# Patient Record
Sex: Male | Born: 1945 | Race: Black or African American | Hispanic: No | Smoking: Never smoker
Health system: Southern US, Community
[De-identification: ages and names within clinical notes are randomized; demographics above are authoritative.]

## PROBLEM LIST (undated history)

## (undated) DIAGNOSIS — A419 Sepsis, unspecified organism: Secondary | ICD-10-CM

## (undated) DIAGNOSIS — R6520 Severe sepsis without septic shock: Secondary | ICD-10-CM

## (undated) DIAGNOSIS — I4891 Unspecified atrial fibrillation: Secondary | ICD-10-CM

## (undated) DIAGNOSIS — I639 Cerebral infarction, unspecified: Secondary | ICD-10-CM

## (undated) DIAGNOSIS — U071 COVID-19: Secondary | ICD-10-CM

## (undated) DIAGNOSIS — J1282 Pneumonia due to coronavirus disease 2019: Secondary | ICD-10-CM

## (undated) DIAGNOSIS — N400 Enlarged prostate without lower urinary tract symptoms: Secondary | ICD-10-CM

## (undated) DIAGNOSIS — J9621 Acute and chronic respiratory failure with hypoxia: Secondary | ICD-10-CM

---

## 2019-11-21 ENCOUNTER — Other Ambulatory Visit (HOSPITAL_COMMUNITY): Payer: Medicare HMO

## 2019-11-21 ENCOUNTER — Inpatient Hospital Stay
Admission: EM | Admit: 2019-11-21 | Discharge: 2020-02-14 | Payer: Medicare HMO | Source: Other Acute Inpatient Hospital | Attending: Internal Medicine | Admitting: Internal Medicine

## 2019-11-21 DIAGNOSIS — J189 Pneumonia, unspecified organism: Secondary | ICD-10-CM

## 2019-11-21 DIAGNOSIS — J398 Other specified diseases of upper respiratory tract: Secondary | ICD-10-CM

## 2019-11-21 DIAGNOSIS — R0603 Acute respiratory distress: Secondary | ICD-10-CM

## 2019-11-21 DIAGNOSIS — N179 Acute kidney failure, unspecified: Secondary | ICD-10-CM

## 2019-11-21 DIAGNOSIS — Z930 Tracheostomy status: Secondary | ICD-10-CM

## 2019-11-21 DIAGNOSIS — R6520 Severe sepsis without septic shock: Secondary | ICD-10-CM | POA: Diagnosis present

## 2019-11-21 DIAGNOSIS — U071 COVID-19: Secondary | ICD-10-CM | POA: Diagnosis present

## 2019-11-21 DIAGNOSIS — R0902 Hypoxemia: Secondary | ICD-10-CM

## 2019-11-21 DIAGNOSIS — D729 Disorder of white blood cells, unspecified: Secondary | ICD-10-CM

## 2019-11-21 DIAGNOSIS — R7981 Abnormal blood-gas level: Secondary | ICD-10-CM

## 2019-11-21 DIAGNOSIS — J90 Pleural effusion, not elsewhere classified: Secondary | ICD-10-CM

## 2019-11-21 DIAGNOSIS — J9600 Acute respiratory failure, unspecified whether with hypoxia or hypercapnia: Secondary | ICD-10-CM | POA: Diagnosis present

## 2019-11-21 DIAGNOSIS — Z931 Gastrostomy status: Secondary | ICD-10-CM

## 2019-11-21 DIAGNOSIS — J9690 Respiratory failure, unspecified, unspecified whether with hypoxia or hypercapnia: Secondary | ICD-10-CM

## 2019-11-21 DIAGNOSIS — J09X1 Influenza due to identified novel influenza A virus with pneumonia: Secondary | ICD-10-CM

## 2019-11-21 DIAGNOSIS — J9621 Acute and chronic respiratory failure with hypoxia: Secondary | ICD-10-CM | POA: Diagnosis present

## 2019-11-21 DIAGNOSIS — A419 Sepsis, unspecified organism: Secondary | ICD-10-CM | POA: Diagnosis present

## 2019-11-21 DIAGNOSIS — I4891 Unspecified atrial fibrillation: Secondary | ICD-10-CM | POA: Diagnosis present

## 2019-11-21 DIAGNOSIS — Z9911 Dependence on respirator [ventilator] status: Secondary | ICD-10-CM

## 2019-11-21 DIAGNOSIS — Z992 Dependence on renal dialysis: Secondary | ICD-10-CM

## 2019-11-21 HISTORY — DX: Acute and chronic respiratory failure with hypoxia: J96.21

## 2019-11-21 HISTORY — DX: Pneumonia due to coronavirus disease 2019: J12.82

## 2019-11-21 HISTORY — DX: Sepsis, unspecified organism: R65.20

## 2019-11-21 HISTORY — DX: Sepsis, unspecified organism: A41.9

## 2019-11-21 HISTORY — DX: Unspecified atrial fibrillation: I48.91

## 2019-11-21 HISTORY — DX: COVID-19: U07.1

## 2019-11-22 ENCOUNTER — Encounter: Payer: Self-pay | Admitting: Internal Medicine

## 2019-11-22 ENCOUNTER — Other Ambulatory Visit (HOSPITAL_COMMUNITY): Payer: Medicare HMO

## 2019-11-22 DIAGNOSIS — A419 Sepsis, unspecified organism: Secondary | ICD-10-CM | POA: Diagnosis not present

## 2019-11-22 DIAGNOSIS — R6520 Severe sepsis without septic shock: Secondary | ICD-10-CM

## 2019-11-22 DIAGNOSIS — J9621 Acute and chronic respiratory failure with hypoxia: Secondary | ICD-10-CM | POA: Diagnosis not present

## 2019-11-22 DIAGNOSIS — U071 COVID-19: Secondary | ICD-10-CM | POA: Diagnosis present

## 2019-11-22 DIAGNOSIS — I4891 Unspecified atrial fibrillation: Secondary | ICD-10-CM | POA: Diagnosis not present

## 2019-11-22 DIAGNOSIS — J1282 Pneumonia due to coronavirus disease 2019: Secondary | ICD-10-CM | POA: Diagnosis present

## 2019-11-22 DIAGNOSIS — J9600 Acute respiratory failure, unspecified whether with hypoxia or hypercapnia: Secondary | ICD-10-CM | POA: Diagnosis present

## 2019-11-22 LAB — COMPREHENSIVE METABOLIC PANEL
ALT: 39 U/L (ref 0–44)
AST: 36 U/L (ref 15–41)
Albumin: 1.3 g/dL — ABNORMAL LOW (ref 3.5–5.0)
Alkaline Phosphatase: 161 U/L — ABNORMAL HIGH (ref 38–126)
Anion gap: 7 (ref 5–15)
BUN: 48 mg/dL — ABNORMAL HIGH (ref 8–23)
CO2: 20 mmol/L — ABNORMAL LOW (ref 22–32)
Calcium: 7.9 mg/dL — ABNORMAL LOW (ref 8.9–10.3)
Chloride: 121 mmol/L — ABNORMAL HIGH (ref 98–111)
Creatinine, Ser: 0.99 mg/dL (ref 0.61–1.24)
GFR calc Af Amer: >60 mL/min (ref >60)
GFR calc non Af Amer: >60 mL/min (ref >60)
Glucose, Bld: 134 mg/dL — ABNORMAL HIGH (ref 70–99)
Potassium: 4.4 mmol/L (ref 3.5–5.1)
Sodium: 148 mmol/L — ABNORMAL HIGH (ref 135–145)
Total Bilirubin: 0.8 mg/dL (ref 0.3–1.2)
Total Protein: 5.1 g/dL — ABNORMAL LOW (ref 6.5–8.1)

## 2019-11-22 LAB — VANCOMYCIN, RANDOM: Vancomycin Rm: 31

## 2019-11-22 LAB — URINALYSIS, ROUTINE W REFLEX MICROSCOPIC
Bilirubin Urine: NEGATIVE
Glucose, UA: NEGATIVE mg/dL
Ketones, ur: NEGATIVE mg/dL
Nitrite: NEGATIVE
Protein, ur: NEGATIVE mg/dL
Specific Gravity, Urine: 1.015 (ref 1.005–1.030)
pH: 5 (ref 5.0–8.0)

## 2019-11-22 LAB — PROTIME-INR
INR: 1.6 — ABNORMAL HIGH (ref 0.8–1.2)
Prothrombin Time: 19.2 seconds — ABNORMAL HIGH (ref 11.4–15.2)

## 2019-11-22 LAB — CBC
HCT: 26.6 % — ABNORMAL LOW (ref 39.0–52.0)
Hemoglobin: 8.7 g/dL — ABNORMAL LOW (ref 13.0–17.0)
MCH: 26.8 pg (ref 26.0–34.0)
MCHC: 32.7 g/dL (ref 30.0–36.0)
MCV: 81.8 fL (ref 80.0–100.0)
Platelets: 253 10*3/uL (ref 150–400)
RBC: 3.25 MIL/uL — ABNORMAL LOW (ref 4.22–5.81)
WBC: 32.4 10*3/uL — ABNORMAL HIGH (ref 4.0–10.5)
nRBC: 0.1 % (ref 0.0–0.2)

## 2019-11-22 LAB — VANCOMYCIN, TROUGH: Vancomycin Tr: 27 ug/mL (ref 15–20)

## 2019-11-22 LAB — TSH: TSH: 7.162 u[IU]/mL — ABNORMAL HIGH (ref 0.350–4.500)

## 2019-11-22 LAB — APTT: aPTT: 33 seconds (ref 24–36)

## 2019-11-22 LAB — TROPONIN I (HIGH SENSITIVITY): Troponin I (High Sensitivity): 38 ng/L — ABNORMAL HIGH (ref <18)

## 2019-11-22 MED ORDER — ALBUTEROL SULFATE HFA 108 (90 BASE) MCG/ACT IN AERS
2.00 | INHALATION_SPRAY | RESPIRATORY_TRACT | Status: DC
Start: ? — End: 2019-11-22

## 2019-11-22 MED ORDER — TOBRAMYCIN SULFATE 80 MG/2ML IJ SOLN
300.00 | INTRAMUSCULAR | Status: DC
Start: 2019-11-21 — End: 2019-11-22

## 2019-11-22 MED ORDER — IPRATROPIUM-ALBUTEROL 0.5-2.5 (3) MG/3ML IN SOLN
3.00 | RESPIRATORY_TRACT | Status: DC
Start: ? — End: 2019-11-22

## 2019-11-22 MED ORDER — LACTATED RINGERS IV SOLN
500.00 | INTRAVENOUS | Status: DC
Start: ? — End: 2019-11-22

## 2019-11-22 MED ORDER — GENERIC EXTERNAL MEDICATION
Status: DC
Start: ? — End: 2019-11-22

## 2019-11-22 MED ORDER — HEPARIN SODIUM (PORCINE) 5000 UNIT/ML IJ SOLN
5000.00 | INTRAMUSCULAR | Status: DC
Start: 2019-11-21 — End: 2019-11-22

## 2019-11-22 MED ORDER — NALOXONE HCL 0.4 MG/ML IJ SOLN
0.04 | INTRAMUSCULAR | Status: DC
Start: ? — End: 2019-11-22

## 2019-11-22 MED ORDER — GENERIC EXTERNAL MEDICATION
4000.00 | Status: DC
Start: ? — End: 2019-11-22

## 2019-11-22 MED ORDER — GENERIC EXTERNAL MEDICATION
1.00 | Status: DC
Start: ? — End: 2019-11-22

## 2019-11-22 MED ORDER — AMIODARONE HCL 200 MG PO TABS
400.00 | ORAL_TABLET | ORAL | Status: DC
Start: 2019-11-21 — End: 2019-11-22

## 2019-11-22 MED ORDER — GENERIC EXTERNAL MEDICATION
500.00 | Status: DC
Start: 2019-11-21 — End: 2019-11-22

## 2019-11-22 MED ORDER — ATROPINE SULFATE 0.5 MG/5ML IJ SOSY
0.50 | PREFILLED_SYRINGE | INTRAMUSCULAR | Status: DC
Start: ? — End: 2019-11-22

## 2019-11-22 MED ORDER — METOPROLOL TARTRATE 5 MG/5ML IV SOLN
5.00 | INTRAVENOUS | Status: DC
Start: ? — End: 2019-11-22

## 2019-11-22 MED ORDER — COLLAGENASE 250 UNIT/GM EX OINT
TOPICAL_OINTMENT | CUTANEOUS | Status: DC
Start: 2019-11-21 — End: 2019-11-22

## 2019-11-22 MED ORDER — CLONAZEPAM 1 MG PO TABS
1.00 | ORAL_TABLET | ORAL | Status: DC
Start: ? — End: 2019-11-22

## 2019-11-22 MED ORDER — EPINEPHRINE 1 MG/10ML IJ SOSY
1.00 | PREFILLED_SYRINGE | INTRAMUSCULAR | Status: DC
Start: ? — End: 2019-11-22

## 2019-11-22 MED ORDER — DEXTROSE 50 % IV SOLN
25.00 | INTRAVENOUS | Status: DC
Start: ? — End: 2019-11-22

## 2019-11-22 MED ORDER — ACETAMINOPHEN 325 MG PO TABS
650.00 | ORAL_TABLET | ORAL | Status: DC
Start: ? — End: 2019-11-22

## 2019-11-22 MED ORDER — SODIUM CHLORIDE 0.9 % IV SOLN
INTRAVENOUS | Status: DC
Start: ? — End: 2019-11-22

## 2019-11-22 MED ORDER — PROSOURCE TF PO LIQD
90.00 | ORAL | Status: DC
Start: 2019-11-21 — End: 2019-11-22

## 2019-11-22 MED ORDER — GENERIC EXTERNAL MEDICATION
2.00 | Status: DC
Start: ? — End: 2019-11-22

## 2019-11-22 MED ORDER — TAMSULOSIN HCL 0.4 MG PO CAPS
0.40 | ORAL_CAPSULE | ORAL | Status: DC
Start: 2019-11-22 — End: 2019-11-22

## 2019-11-22 MED ORDER — ASPIRIN-DIPYRIDAMOLE ER 25-200 MG PO CP12
1.00 | ORAL_CAPSULE | ORAL | Status: DC
Start: 2019-11-21 — End: 2019-11-22

## 2019-11-22 MED ORDER — ERGOCALCIFEROL 200 MCG/ML PO SOLN
1000.00 | ORAL | Status: DC
Start: 2019-11-22 — End: 2019-11-22

## 2019-11-22 MED ORDER — ONDANSETRON HCL 4 MG/2ML IJ SOLN
4.00 | INTRAMUSCULAR | Status: DC
Start: ? — End: 2019-11-22

## 2019-11-22 MED ORDER — INSULIN ASPART 100 UNIT/ML FLEXPEN
0.00 | PEN_INJECTOR | SUBCUTANEOUS | Status: DC
Start: 2019-11-21 — End: 2019-11-22

## 2019-11-22 MED ORDER — METOCLOPRAMIDE HCL 5 MG/ML IJ SOLN
5.00 | INTRAMUSCULAR | Status: DC
Start: 2019-11-21 — End: 2019-11-22

## 2019-11-22 MED ORDER — PROPOFOL 100 MG/10ML IV EMUL
5.00 | INTRAVENOUS | Status: DC
Start: ? — End: 2019-11-22

## 2019-11-22 MED ORDER — DAKINS (1/4 STRENGTH) 0.125 % EX SOLN
CUTANEOUS | Status: DC
Start: 2019-11-21 — End: 2019-11-22

## 2019-11-22 MED ORDER — PROSOURCE TF PO LIQD
45.00 | ORAL | Status: DC
Start: 2019-11-22 — End: 2019-11-22

## 2019-11-22 MED ORDER — INSULIN GLARGINE 100 UNIT/ML SOLOSTAR PEN
18.00 | PEN_INJECTOR | SUBCUTANEOUS | Status: DC
Start: 2019-11-22 — End: 2019-11-22

## 2019-11-22 MED ORDER — VANCOMYCIN HCL IN DEXTROSE 1-5 GM/200ML-% IV SOLN
1000.00 | INTRAVENOUS | Status: DC
Start: 2019-11-22 — End: 2019-11-22

## 2019-11-22 MED ORDER — GENERIC EXTERNAL MEDICATION
50.00 | Status: DC
Start: ? — End: 2019-11-22

## 2019-11-22 MED ORDER — L-LYSINE EX OINT
TOPICAL_OINTMENT | CUTANEOUS | Status: DC
Start: ? — End: 2019-11-22

## 2019-11-22 MED ORDER — MAGNESIUM SULFATE 4 GM/100ML IV SOLN
4.00 | INTRAVENOUS | Status: DC
Start: ? — End: 2019-11-22

## 2019-11-22 MED ORDER — CARBOXYMETHYLCELLULOSE SODIUM 0.5 % OP SOLN
2.00 | OPHTHALMIC | Status: DC
Start: 2019-11-21 — End: 2019-11-22

## 2019-11-22 NOTE — Consult Note (Signed)
[facility]  Date of Service: 11/22/2019  PULMONARY CRITICAL CARE CONSULT   Shane Banks  [id number]  DOB: 1946-09-14   DOA: 11/21/2019  Referring Physician: [name], MD  HPI: Shane Banks is a 74 y.o. male seen for follow up of Acute on Chronic Respiratory Failure.  Patient has multiple medical problems including hypertension stroke cardiomyopathy gout has had an infection with COVID-19 had been admitted to the hospital however came back in because of shortness of breath was noted to be severely hypoxic.  Patient initially had not been given remdesivir or plasma however this time patient was more hypoxic and was given steroids.  In addition patient was noted to have some demand ischemia felt to be secondary to the Covid infection.  During the ED stay the patient having worsening of weakness increasing shortness of breath and fever up to 101.5 ended up intubated on the ventilator.  Right now is on the ventilator with a tracheostomy and is on pressure support 12/5  Review of Systems:  ROS performed and is unremarkable other than noted above.  PAST MEDICAL HISTORY Past Medical History:  Diagnosis Date  . Benign essential hypertension  . Cardiac murmur  . Colon polyp  . CVD (cerebrovascular disease) 2012  . Gout  . Hypertrophic cardiomyopathy ([place])  . Prediabetes  . Teeth missing    PAST SURGICAL HISTORY Past Surgical History:  Procedure Laterality Date  . HX COLONOSCOPY 02/28/2007  polyps  . PR COLONOSCOPY FLX DX W/COLLJ SPEC WHEN PFRMD N/A 07/14/2016  COLONOSCOPY FLX DX W/COLLJ SPEC WHEN PFRMD performed by Hulen Luster, MD at [facility]  . PR ESOPHAGOGASTRODUODENOSCOPY TRANSORAL DIAGNOSTIC N/A 03/06/2017  ESOPHAGOGASTRODUODENOSCOPY TRANSORAL DIAGNOSTIC performed by Karie Chimera, MD at [place]  . PT DENIES RELEVANT SURGICAL HISTORY   ALLERGIES Allergies  Allergen Reactions  . Penicillin G  Unknown  . Penicillins Rash and Itching    FAMILY HISTORY Family History  Problem Relation Name Age of Onset  . No Known Problems Father  . No Known Problems Mother   SOCIAL HISTORY Social History   Tobacco Use  . Smoking status: Former Smoker  Quit date: 09/28/2005  Years since quitting: 14.0  . Smokeless tobacco: Never Used  Substance Use Topics  . Alcohol use: Yes  Comment: Occasional  . Drug use: No    Medications: Reviewed on Rounds  Physical Exam:  Vitals: Temperature 96.3 pulse 68 respiratory 25 blood pressure 116/65 saturations 100%  Ventilator Settings mode of ventilation pressure support FiO2 28% pressure 12 PEEP 5  . General: Comfortable at this time . Eyes: Grossly normal lids, irises & conjunctiva . ENT: grossly tongue is normal . Neck: no obvious mass . Cardiovascular: S1-S2 normal no gallop or rub . Respiratory: Coarse breath sounds noted bilaterally . Abdomen: Soft and nontender . Skin: no rash seen on limited exam . Musculoskeletal: not rigid . Psychiatric:unable to assess . Neurologic: no seizure no involuntary movements         Labs on Admission:  Basic Metabolic Panel: Recent Labs  Lab 11/22/19 0617  NA 148*  K 4.4  CL 121*  CO2 20*  GLUCOSE 134*  BUN 48*  CREATININE 0.99  CALCIUM 7.9*    No results for input(s): PHART, PCO2ART, PO2ART, HCO3, O2SAT in the last 168 hours.  Liver Function Tests: Recent Labs  Lab 11/22/19 0617  AST 36  ALT 39  ALKPHOS 161*  BILITOT 0.8  PROT 5.1*  ALBUMIN 1.3*   No results for input(s): LIPASE, AMYLASE in the last 168 hours. No results for input(s): AMMONIA in the last 168 hours.  CBC: Recent Labs  Lab 11/22/19 0617  WBC 32.4*  HGB 8.7*  HCT 26.6*  MCV 81.8  PLT 253    Cardiac Enzymes: No results for input(s): CKTOTAL, CKMB, CKMBINDEX, TROPONINI in the last 168 hours.  BNP (last 3 results) No results for input(s): BNP in the last 8760 hours.  ProBNP (last 3 results) No  results for input(s): PROBNP in the last 8760 hours.   Radiological Exams on Admission: DG ABDOMEN PEG TUBE LOCATION  Result Date: 11/21/2019 CLINICAL DATA:  Peg tube check EXAM: ABDOMEN - 1 VIEW COMPARISON:  None. FINDINGS: Midline cutaneous staples. Omnipaque 300 contrast injected through patient's gastrostomy tube. Contrast opacifies the stomach and proximal duodenum. No gross extravasation. Nonobstructed gas pattern. IMPRESSION: Gastrostomy tube appears positioned within the distal stomach. No gross extravasation. Electronically Signed   By: Donavan Foil M.D.   On: 11/21/2019 22:44   DG CHEST PORT 1 VIEW  Result Date: 11/22/2019 CLINICAL DATA:  Tracheostomy, COVID-19 EXAM: PORTABLE CHEST 1 VIEW COMPARISON:  None. FINDINGS: Tracheostomy tube terminates in the mid to upper trachea, 6 cm from the carina. Right upper extremity PICC tip terminates in the right atrium. Telemetry leads and additional support devices overlie the chest. Dense mixed interstitial and airspace opacity is noted in the left mid to lower lung and retrocardiac space. Milder changes noted in the right lung base. Suspect left effusion. No visible right pleural fluid. No pneumothorax. Atherosclerotic calcification of the aorta. Cardiomediastinal contours are otherwise unremarkable for the portable technique. No acute osseous or soft tissue abnormality. Degenerative changes are present in the imaged spine and shoulders. IMPRESSION: Extensive bilateral interstitial and airspace opacities most pronounced in the left mid to lower lung. Support devices as above. Electronically Signed   By: Lovena Le M.D.   On: 11/22/2019 06:46    Assessment/Plan Active Problems:   Acute on chronic respiratory failure with hypoxia (HCC)   COVID-19 virus infection   Atrial fibrillation with RVR (HCC)   Severe sepsis (HCC)   Pneumonia due to COVID-19 virus   1. Acute on chronic respiratory failure with hypoxia patient is transferred to [place] for  basically weaning right now is on pressure support mode and requiring 28% FiO2.  Plan will be to continue on the weaning protocol on 12/5 pressure support.  Seems to be tolerating it well.  Physical therapy speech therapy will evaluate the patient 2. COVID-19 virus infection now in resolution phase we will continue to follow chest x-ray still shows some residual changes secondary to the COVID-19. 3. Atrial fibrillation with RVR we will continue to monitor the heart rate closely rate now is rate controlled. 4. Severe sepsis resolved we will continue with supportive care 5. Pneumonia due to COVID-19 treated we will continue to follow-up radiologically patient does have residual damage noted on the chest films  I have personally seen and evaluated the patient, evaluated laboratory and imaging results, formulated the assessment and plan and placed orders. The Patient requires high complexity decision making with multiple systems involvement.  Case was discussed on Rounds with the Respiratory Therapy Director and the Respiratory staff Time Spent 61minutes  Braeley Buskey A Boone Gear, MD [facility] Pulmonary Critical Care Medicine Sleep Medicine

## 2019-11-23 DIAGNOSIS — J9621 Acute and chronic respiratory failure with hypoxia: Secondary | ICD-10-CM | POA: Diagnosis not present

## 2019-11-23 DIAGNOSIS — A419 Sepsis, unspecified organism: Secondary | ICD-10-CM | POA: Diagnosis not present

## 2019-11-23 DIAGNOSIS — Z930 Tracheostomy status: Secondary | ICD-10-CM

## 2019-11-23 DIAGNOSIS — I4891 Unspecified atrial fibrillation: Secondary | ICD-10-CM | POA: Diagnosis not present

## 2019-11-23 DIAGNOSIS — U071 COVID-19: Secondary | ICD-10-CM | POA: Diagnosis not present

## 2019-11-23 LAB — URINE CULTURE: Culture: 50000 — AB

## 2019-11-23 LAB — VANCOMYCIN, TROUGH: Vancomycin Tr: 21 ug/mL (ref 15–20)

## 2019-11-23 NOTE — Consult Note (Signed)
Infectious Disease Consultation   [name]  [id number]  DOB: 02/28/1946  DOA: 11/21/2019  Requesting physician: Dr. Owens Shark  Reason for consultation: Antibiotic recommendations   History of Present Illness: Shane Banks is an 75 y.o. male who was admitted to [facility] on 11/21/2019.  He has a past medical history of peripheral vascular disease, hypertension, cardiomyopathy, gout.  He apparently was admitted to the acute facility for worsening shortness of breath on 10/06/2019 through 11/21/2019.  He was also previously admitted on 10/02/2019 for COVID-19 infection.  After he was discharged he apparently had worsening shortness of breath, hypoxemia.  He was also noted to have fever of 101.5.  Chest x-ray at that time was consistent with pneumonia.  He also had elevated troponin which was thought to be secondary to demand ischemia.  He initially was not given remdesivir or plasma but when he presented back the second time he was more hypoxemic and was given steroids.  Patient hospital course complicated by worsening shortness of breath leading to respiratory failure which led to intubation.  He was on the ventilator.  He had a difficult time weaning therefore underwent tracheostomy.  Per records from the outside facility on 10/22/2019 patient apparently had purple blistering on the sacral region for which the wound care team was consulted.  The wounds were sent and on 11/13/2019 he underwent debridement of the sacrum with debridement of skin, muscle, necrotic fat.  Infectious disease was also consulted.  Since admission he was on multiple antibiotics initially with ceftriaxone/azithromycin/Fortaz and then with meropenem/vancomycin.  Patient continued to have progressive increase in WBC count and new onset fevers despite being on antibiotics.  Per ID consult he was started on tigecycline for intra-abdominal coverage and empiric p.o. vancomycin.  P.o. vancomycin was later changed to  Dificid on 10/24/2019 for diarrhea.  Cultures however were negative.  His hep C was also positive.  He was treated with remdesivir and convalescent plasma.  Hospital course complicated by thrombocytopenia, acute renal failure.  Due to his worsening pressure ulcer he underwent laparoscopic diverting end colostomy on 11/16/2019. He was also treated with IV vancomycin, meropenem, Flagyl and Tobra nebulizers? Lab work here showing elevated WBC count of 32.4.  He currently is on the ventilator, has a trach in place.  Not responding.   Review of Systems:  Patient with trach, not responding.  Unable to obtain review of systems at this time.   Past Medical History: Past Medical History:  Diagnosis Date  . Acute on chronic respiratory failure with hypoxia ([place])   . Atrial fibrillation with RVR ([place])   . COVID-19 virus infection   . Pneumonia due to COVID-19 virus   . Severe sepsis (HCC)   Hypertension, cardiac murmur, colon polyp, cerebrovascular disease, gout, hypertrophic cardiomyopathy, prediabetes  Past Surgical History: HX COLONOSCOPY 02/28/2007  polyps  . PR COLONOSCOPY FLX DX W/COLLJ SPEC WHEN PFRMD N/A 07/14/2016  COLONOSCOPY FLX DX W/COLLJ SPEC WHEN PFRMD performed by Hulen Luster, MD at [place]  . PR ESOPHAGOGASTRODUODENOSCOPY TRANSORAL DIAGNOSTIC N/A 03/06/2017  ESOPHAGOGASTRODUODENOSCOPY TRANSORAL DIAGNOSTIC performed by Karie Chimera, MD at [place]  . PT DENIES RELEVANT SURGICAL HISTORY    Allergies:  Allergen Reactions  . Penicillin G Unknown  . Penicillins Rash and Itching    Social History: Smoking status: Former Smoker  Quit date: 09/28/2005  Years since quitting: 14.0  . Smokeless tobacco: Never Used  Substance Use Topics  .  Alcohol use: Yes  Comment: Occasional  . Drug use: No    Family History: No Known Problems Father  . No Known Problems Mother    Physical Exam: Vitals: Temperature 96.8, pulse 68, respiratory rate 32, blood pressure 120/64, pulse  oximetry 100% on 28% FiO2, PEEP of 5 Constitutional: Ill-appearing male, not responding Eyes: PERLA  ENMT: external ears and nose appear normal, Lips appears normal, unable to evaluate the oropharynx Neck: Has trach in place CVS: S1-S2, no murmur Respiratory: Rhonchi, no wheezing Abdomen: Soft, ostomy, positive bowel sounds, PEG tube Musculoskeletal: Bilateral upper extremity edema, no lower extremity edema Neuro: Patient not responding, unable to do a neurologic exam at this time Psych: Unable to assess at this time Skin: Sacral pressure ulcer unstageable  Data reviewed:  I have personally reviewed following labs and imaging studies Labs:  CBC: Recent Labs  Lab 11/22/19 0617  WBC 32.4*  HGB 8.7*  HCT 26.6*  MCV 81.8  PLT 123456    Basic Metabolic Panel: Recent Labs  Lab 11/22/19 0617  NA 148*  K 4.4  CL 121*  CO2 20*  GLUCOSE 134*  BUN 48*  CREATININE 0.99  CALCIUM 7.9*   GFR CrCl cannot be calculated (Unknown ideal weight.). Liver Function Tests: Recent Labs  Lab 11/22/19 0617  AST 36  ALT 39  ALKPHOS 161*  BILITOT 0.8  PROT 5.1*  ALBUMIN 1.3*   No results for input(s): LIPASE, AMYLASE in the last 168 hours. No results for input(s): AMMONIA in the last 168 hours. Coagulation profile Recent Labs  Lab 11/22/19 0617  INR 1.6*    Cardiac Enzymes: No results for input(s): CKTOTAL, CKMB, CKMBINDEX, TROPONINI in the last 168 hours. BNP: Invalid input(s): POCBNP CBG: No results for input(s): GLUCAP in the last 168 hours. D-Dimer No results for input(s): DDIMER in the last 72 hours. Hgb A1c No results for input(s): HGBA1C in the last 72 hours. Lipid Profile No results for input(s): CHOL, HDL, LDLCALC, TRIG, CHOLHDL, LDLDIRECT in the last 72 hours. Thyroid function studies Recent Labs    11/22/19 0617  TSH 7.162*   Anemia work up No results for input(s): VITAMINB12, FOLATE, FERRITIN, TIBC, IRON, RETICCTPCT in the last 72 hours. Urinalysis     Component Value Date/Time   COLORURINE YELLOW 11/22/2019 1546   APPEARANCEUR CLOUDY (A) 11/22/2019 1546   LABSPEC 1.015 11/22/2019 1546   PHURINE 5.0 11/22/2019 1546   GLUCOSEU NEGATIVE 11/22/2019 1546   HGBUR LARGE (A) 11/22/2019 1546   BILIRUBINUR NEGATIVE 11/22/2019 1546   KETONESUR NEGATIVE 11/22/2019 1546   PROTEINUR NEGATIVE 11/22/2019 1546   NITRITE NEGATIVE 11/22/2019 1546   LEUKOCYTESUR LARGE (A) 11/22/2019 1546     Microbiology Recent Results (from the past 240 hour(s))  Culture, Urine     Status: Abnormal   Collection Time: 11/22/19  1:25 PM   Specimen: Urine, Random  Result Value Ref Range Status   Specimen Description URINE, RANDOM  Final   Special Requests   Final    NONE Performed at [facility], [place] [address] [place], [place] [address]    Culture 50,000 COLONIES/mL YEAST (A)  Final   Report Status 11/23/2019 FINAL  Final  Culture, blood (routine x 2)     Status: None (Preliminary result)   Collection Time: 11/22/19  1:40 PM   Specimen: BLOOD LEFT HAND  Result Value Ref Range Status   Specimen Description BLOOD LEFT HAND  Final   Special Requests   Final  BOTTLES DRAWN AEROBIC ONLY Blood Culture results may not be optimal due to an inadequate volume of blood received in culture bottles Performed at [facility], [address], [place], [place] [address]    Culture NO GROWTH 1 DAY  Final   Report Status PENDING  Incomplete  Culture, blood (routine x 2)     Status: None (Preliminary result)   Collection Time: 11/22/19  1:44 PM   Specimen: BLOOD LEFT HAND  Result Value Ref Range Status   Specimen Description BLOOD LEFT HAND  Final   Special Requests   Final    BOTTLES DRAWN AEROBIC ONLY Blood Culture results may not be optimal due to an inadequate volume of blood received in culture bottles Performed at [facility], [place] [address] [place], [place] [address]    Culture NO GROWTH 1 DAY  Final   Report Status PENDING  Incomplete   Culture, respiratory (non-expectorated)     Status: None (Preliminary result)   Collection Time: 11/22/19  4:59 PM   Specimen: Tracheal Aspirate; Respiratory  Result Value Ref Range Status   Specimen Description TRACHEAL ASPIRATE  Final   Special Requests NONE  Final   Gram Stain   Final    ABUNDANT WBC PRESENT, PREDOMINANTLY PMN NO ORGANISMS SEEN    Culture   Final    NO GROWTH < 24 HOURS Performed at [facility], [address] [place], [place] [address]    Report Status PENDING  Incomplete       Inpatient Medications:   Please see MAR   Radiological Exams on Admission: DG ABDOMEN PEG TUBE LOCATION  Result Date: 11/21/2019 CLINICAL DATA:  Peg tube check EXAM: ABDOMEN - 1 VIEW COMPARISON:  None. FINDINGS: Midline cutaneous staples. Omnipaque 300 contrast injected through patient's gastrostomy tube. Contrast opacifies the stomach and proximal duodenum. No gross extravasation. Nonobstructed gas pattern. IMPRESSION: Gastrostomy tube appears positioned within the distal stomach. No gross extravasation. Electronically Signed   By: Donavan Foil M.D.   On: 11/21/2019 22:44   DG CHEST PORT 1 VIEW  Result Date: 11/22/2019 CLINICAL DATA:  Tracheostomy, COVID-19 EXAM: PORTABLE CHEST 1 VIEW COMPARISON:  None. FINDINGS: Tracheostomy tube terminates in the mid to upper trachea, 6 cm from the carina. Right upper extremity PICC tip terminates in the right atrium. Telemetry leads and additional support devices overlie the chest. Dense mixed interstitial and airspace opacity is noted in the left mid to lower lung and retrocardiac space. Milder changes noted in the right lung base. Suspect left effusion. No visible right pleural fluid. No pneumothorax. Atherosclerotic calcification of the aorta. Cardiomediastinal contours are otherwise unremarkable for the portable technique. No acute osseous or soft tissue abnormality. Degenerative changes are present in the imaged spine and shoulders.  IMPRESSION: Extensive bilateral interstitial and airspace opacities most pronounced in the left mid to lower lung. Support devices as above. Electronically Signed   By: Lovena Le M.D.   On: 11/22/2019 06:46    Impression/Recommendations Active Problems:   Acute on chronic respiratory failure with hypoxia (HCC)   COVID-19 virus infection   Atrial fibrillation with RVR (HCC)   Severe sepsis (HCC)   Pneumonia due to COVID-19 virus Sacral pressure ulcer unstageable Leukocytosis Pneumonia due to Pseudomonas UTI Atrial fibrillation Diabetes mellitus type 2 Dysphagia Protein calorie malnutrition Hepatitis C  Acute on chronic hypoxemic respiratory failure: Multifactorial.  Initially started with COVID-19 infection.  However, patient subsequently had bacterial pneumonia with Pseudomonas.  He has  received several antibiotics at the outside facility including Fortaz, meropenem, cefepime, tobramycin nebulizers, vancomycin, azithromycin, ceftriaxone, tigecycline.  At this time he is on treatment with vancomycin, meropenem, Flagyl.  Will discontinue the Flagyl.  Continue empiric IV vancomycin, meropenem.  Will order respiratory cultures.  Pulmonary also following.  Follow-up on the respiratory cultures and adjust antibiotics accordingly.  Recent COVID-19 infection: Patient was already treated with remdesivir and convalescent plasma at the outside facility.  He was on steroids.  Continue supportive management per the primary team.  Pneumonia: His respiratory cultures at the outside facility reportedly showed Pseudomonas.  He was treated with tobramycin nebulizers which have been discontinued at this time.  Respiratory status appears to be stable.  He is currently on empiric IV vancomycin, meropenem and Flagyl.  We will discontinue the Flagyl and continue treatment with empiric vancomycin and meropenem.  Ordered repeat respiratory cultures.  Follow-up on the cultures and adjust antibiotics  accordingly.  UTI: UA showing evidence of UTI.  Currently on empiric IV vancomycin, meropenem.  Await final urine cultures and adjust antibiotics accordingly.  Sacral pressure ulcer unstageable: Patient has a sacral pressure ulcer that was unstageable and had surgical debridement at the outside facility.  Per report there was palpable bone?  Continue local wound care.  Discussed with the primary team and they will consult surgery to evaluate the wound.  Severe sepsis: Patient was on pressors at the outside facility.  He has received several rounds of antibiotics as mentioned above.  Currently on empiric IV vancomycin, meropenem, Flagyl.  Would recommend to discontinue the Flagyl and continue with IV vancomycin and meropenem.  If he starts having any fevers greater than 101 would recommend to also send for blood cultures and urine cultures.  Ordered repeat respiratory cultures.  Atrial fibrillation: Continue medications and management per primary team.  Leukocytosis: He has recently received steroids.  Leukocytosis could also be contributed by infected sacral pressure ulcer/pneumonia.  Antibiotics as mentioned above.  Continue to monitor counts.  Diabetes mellitus type 2: Continue to monitor Accu-Cheks, medications for diabetes per the primary team.  Dysphagia: Unfortunately due to his dysphagia he is very high risk for aspiration and worsening respiratory failure secondary to aspiration pneumonia.  Chronically malnutrition: Management per the primary team.  Hepatitis C: Patient will need to follow-up in the outpatient clinic once his acute issues resolve.  Unfortunately due to his complex medical problems he is very high risk for worsening and decompensation.  Plan of care discussed with the primary team.  Thank you for this consultation.    Yaakov Guthrie M.D.  11/23/2019, 3:56 PM

## 2019-11-23 NOTE — Progress Notes (Signed)
Pulmonary [place]   PULMONARY CRITICAL CARE SERVICE  PROGRESS NOTE  Date of Service: 11/23/2019  Jametrius Andring  [id number]  DOB: [birth year]   DOA: 11/21/2019  Referring Physician: [name], MD  HPI: Shane Banks is a 74 y.o. male seen for follow up of Acute on Chronic Respiratory Failure.  Patient had acute decompensation requiring increased work of breathing was attempted on pressure support however patient failed was placed back on assist control mode right now is on 50% FiO2.  Medications: Reviewed on Rounds  Physical Exam:  Vitals: Temperature is 99.1 pulse 92 respiratory rate 31 blood pressure is 135/72 saturations 97%  Ventilator Settings currently on assist control FiO2 is 50% tidal volume 550 with a PEEP of 5  . General: Comfortable at this time . Eyes: Grossly normal lids, irises & conjunctiva . ENT: grossly tongue is normal . Neck: no obvious mass . Cardiovascular: S1 S2 normal no gallop . Respiratory: No rhonchi coarse breath sounds are noted . Abdomen: soft . Skin: no rash seen on limited exam . Musculoskeletal: not rigid . Psychiatric:unable to assess . Neurologic: no seizure no involuntary movements         Lab Data:   Basic Metabolic Panel: Recent Labs  Lab 11/22/19 0617  NA 148*  K 4.4  CL 121*  CO2 20*  GLUCOSE 134*  BUN 48*  CREATININE 0.99  CALCIUM 7.9*    ABG: No results for input(s): PHART, PCO2ART, PO2ART, HCO3, O2SAT in the last 168 hours.  Liver Function Tests: Recent Labs  Lab 11/22/19 0617  AST 36  ALT 39  ALKPHOS 161*  BILITOT 0.8  PROT 5.1*  ALBUMIN 1.3*   No results for input(s): LIPASE, AMYLASE in the last 168 hours. No results for input(s): AMMONIA in the last 168 hours.  CBC: Recent Labs  Lab 11/22/19 0617  WBC 32.4*  HGB 8.7*  HCT 26.6*  MCV 81.8  PLT 253    Cardiac Enzymes: No results for input(s): CKTOTAL, CKMB, CKMBINDEX, TROPONINI in the last 168  hours.  BNP (last 3 results) No results for input(s): BNP in the last 8760 hours.  ProBNP (last 3 results) No results for input(s): PROBNP in the last 8760 hours.  Radiological Exams: DG ABDOMEN PEG TUBE LOCATION  Result Date: 11/21/2019 CLINICAL DATA:  Peg tube check EXAM: ABDOMEN - 1 VIEW COMPARISON:  None. FINDINGS: Midline cutaneous staples. Omnipaque 300 contrast injected through patient's gastrostomy tube. Contrast opacifies the stomach and proximal duodenum. No gross extravasation. Nonobstructed gas pattern. IMPRESSION: Gastrostomy tube appears positioned within the distal stomach. No gross extravasation. Electronically Signed   By: Donavan Foil M.D.   On: 11/21/2019 22:44   DG CHEST PORT 1 VIEW  Result Date: 11/22/2019 CLINICAL DATA:  Tracheostomy, COVID-19 EXAM: PORTABLE CHEST 1 VIEW COMPARISON:  None. FINDINGS: Tracheostomy tube terminates in the mid to upper trachea, 6 cm from the carina. Right upper extremity PICC tip terminates in the right atrium. Telemetry leads and additional support devices overlie the chest. Dense mixed interstitial and airspace opacity is noted in the left mid to lower lung and retrocardiac space. Milder changes noted in the right lung base. Suspect left effusion. No visible right pleural fluid. No pneumothorax. Atherosclerotic calcification of the aorta. Cardiomediastinal contours are otherwise unremarkable for the portable technique. No acute osseous or soft tissue abnormality. Degenerative changes are present in the imaged spine and shoulders. IMPRESSION: Extensive bilateral interstitial and airspace opacities most pronounced in the left  mid to lower lung. Support devices as above. Electronically Signed   By: Lovena Le M.D.   On: 11/22/2019 06:46    Assessment/Plan Active Problems:   Acute on chronic respiratory failure with hypoxia (HCC)   COVID-19 virus infection   Atrial fibrillation with RVR (HCC)   Severe sepsis (HCC)   Pneumonia due to COVID-19  virus   1. Acute on chronic respiratory failure with hypoxia we'll continue with full support on assist control mode currently on 50% FiO2 tidal volume 550 was attempted on pressure support but patient did not tolerate it well.  Chest x-ray was done which showed extensive bilateral interstitial opacities still present.  This is likely secondary to COVID-19. 2. COVID-19 virus infection in resolution phase with residual damage to the lung 3. Pneumonia due to COVID-19 as already noted residual damage to the lung was noted.  Will have respiratory therapy continue to assess weaning readiness. 4. Atrial fibrillation with RVR treated we'll continue to follow 5. Severe sepsis treated hemodynamics right now are stable   I have personally seen and evaluated the patient, evaluated laboratory and imaging results, formulated the assessment and plan and placed orders. The Patient requires high complexity decision making with multiple systems involvement.  Rounds were done with the Respiratory Therapy Director and Staff therapists and discussed with nursing staff also.  Time 35 minutes patient is critically ill in danger of cardiac arrest and death  Allyne Gee, MD [facility] Pulmonary Critical Care Medicine Sleep Medicine

## 2019-11-24 ENCOUNTER — Other Ambulatory Visit (HOSPITAL_COMMUNITY): Payer: Medicare HMO

## 2019-11-24 DIAGNOSIS — I4891 Unspecified atrial fibrillation: Secondary | ICD-10-CM | POA: Diagnosis not present

## 2019-11-24 DIAGNOSIS — U071 COVID-19: Secondary | ICD-10-CM | POA: Diagnosis not present

## 2019-11-24 DIAGNOSIS — A419 Sepsis, unspecified organism: Secondary | ICD-10-CM | POA: Diagnosis not present

## 2019-11-24 DIAGNOSIS — J9621 Acute and chronic respiratory failure with hypoxia: Secondary | ICD-10-CM | POA: Diagnosis not present

## 2019-11-24 LAB — BASIC METABOLIC PANEL
Anion gap: 7 (ref 5–15)
BUN: 58 mg/dL — ABNORMAL HIGH (ref 8–23)
CO2: 22 mmol/L (ref 22–32)
Calcium: 8.1 mg/dL — ABNORMAL LOW (ref 8.9–10.3)
Chloride: 124 mmol/L — ABNORMAL HIGH (ref 98–111)
Creatinine, Ser: 1.07 mg/dL (ref 0.61–1.24)
GFR calc Af Amer: >60 mL/min (ref >60)
GFR calc non Af Amer: >60 mL/min (ref >60)
Glucose, Bld: 231 mg/dL — ABNORMAL HIGH (ref 70–99)
Potassium: 4.5 mmol/L (ref 3.5–5.1)
Sodium: 153 mmol/L — ABNORMAL HIGH (ref 135–145)

## 2019-11-24 LAB — CBC
HCT: 27.7 % — ABNORMAL LOW (ref 39.0–52.0)
Hemoglobin: 9 g/dL — ABNORMAL LOW (ref 13.0–17.0)
MCH: 26.9 pg (ref 26.0–34.0)
MCHC: 32.5 g/dL (ref 30.0–36.0)
MCV: 82.7 fL (ref 80.0–100.0)
Platelets: 215 10*3/uL (ref 150–400)
RBC: 3.35 MIL/uL — ABNORMAL LOW (ref 4.22–5.81)
RDW: 26 % — ABNORMAL HIGH (ref 11.5–15.5)
WBC: 39.9 10*3/uL — ABNORMAL HIGH (ref 4.0–10.5)
nRBC: 0 % (ref 0.0–0.2)

## 2019-11-24 LAB — BLOOD GAS, ARTERIAL
Acid-base deficit: 1.8 mmol/L (ref 0.0–2.0)
Bicarbonate: 21.6 mmol/L (ref 20.0–28.0)
FIO2: 28
O2 Saturation: 99.1 %
Patient temperature: 36.3
pCO2 arterial: 30.6 mmHg — ABNORMAL LOW (ref 32.0–48.0)
pH, Arterial: 7.459 — ABNORMAL HIGH (ref 7.350–7.450)
pO2, Arterial: 123 mmHg — ABNORMAL HIGH (ref 83.0–108.0)

## 2019-11-24 LAB — CULTURE, RESPIRATORY W GRAM STAIN: Culture: NO GROWTH

## 2019-11-24 LAB — MAGNESIUM: Magnesium: 2 mg/dL (ref 1.7–2.4)

## 2019-11-24 MED ORDER — GENERIC EXTERNAL MEDICATION
Status: DC
Start: ? — End: 2019-11-24

## 2019-11-24 NOTE — Progress Notes (Signed)
Pulmonary Critical Care Medicine [place]   PULMONARY CRITICAL CARE SERVICE  PROGRESS NOTE  Date of Service: 11/24/2019  Shane Banks  [id number]  DOB: 17-Nov-1945   DOA: 11/21/2019  Referring Physician: [name], MD  HPI: Shane Banks is a 74 y.o. male seen for follow up of Acute on Chronic Respiratory Failure.  Patient remains on the ventilator was having a great deal of difficulty ventilating it been on assist control with tidal volumes of about 550 however was having high pressures.  I spoke with respiratory therapy to make some adjustments on the ventilator and improve the peak flow which was actually down in the 40s.  Patient appears to be air hungry.  Medications: Reviewed on Rounds  Physical Exam:  Vitals: Temperature is 98.0 pulse 82 respiratory rate was 40 blood pressure 117/63 saturations 94%  Ventilator Settings mode of ventilation was assist control FiO2 28% tidal volume 550 PEEP 5  . General: Comfortable at this time . Eyes: Grossly normal lids, irises & conjunctiva . ENT: grossly tongue is normal . Neck: no obvious mass . Cardiovascular: S1 S2 normal no gallop . Respiratory: No rhonchi diminished breath sounds . Abdomen: soft . Skin: no rash seen on limited exam . Musculoskeletal: not rigid . Psychiatric:unable to assess . Neurologic: no seizure no involuntary movements         Lab Data:   Basic Metabolic Panel: Recent Labs  Lab 11/22/19 0617  NA 148*  K 4.4  CL 121*  CO2 20*  GLUCOSE 134*  BUN 48*  CREATININE 0.99  CALCIUM 7.9*    ABG: No results for input(s): PHART, PCO2ART, PO2ART, HCO3, O2SAT in the last 168 hours.  Liver Function Tests: Recent Labs  Lab 11/22/19 0617  AST 36  ALT 39  ALKPHOS 161*  BILITOT 0.8  PROT 5.1*  ALBUMIN 1.3*   No results for input(s): LIPASE, AMYLASE in the last 168 hours. No results for input(s): AMMONIA in the last 168 hours.  CBC: Recent Labs  Lab 11/22/19 0617  WBC  32.4*  HGB 8.7*  HCT 26.6*  MCV 81.8  PLT 253    Cardiac Enzymes: No results for input(s): CKTOTAL, CKMB, CKMBINDEX, TROPONINI in the last 168 hours.  BNP (last 3 results) No results for input(s): BNP in the last 8760 hours.  ProBNP (last 3 results) No results for input(s): PROBNP in the last 8760 hours.  Radiological Exams: No results found.  Assessment/Plan Active Problems:   Acute on chronic respiratory failure with hypoxia (HCC)   COVID-19 virus infection   Atrial fibrillation with RVR (HCC)   Severe sepsis (HCC)   Pneumonia due to COVID-19 virus   1. Acute on chronic respiratory failure with hypoxia last chest x-ray had shown extensive airspace disease with major component on the left side.  Concerning for fluid versus pleural effusion on that side.  I suggested that we get a follow-up x-ray.  In the meantime also make adjustments on the ventilator were going to try to change to pressure mode see if the patient is able to tolerate that better.  Patient may ultimately require sedation. 2. COVID-19 virus infection in recovery phase we will continue with supportive care 3. Pneumonia due to COVID-19 the last chest x-ray had shown extensive airspace disease have ordered a follow-up chest x-ray if there is significant fluid might need to have a CT scan for possibility of evaluation of size of pleural effusion and possible thoracentesis 4. Atrial fibrillation with RVR controlled rate  now rate is controlled we will continue with supportive care 5. Severe sepsis seen by infectious disease discontinued Flagyl started on empiric vancomycin as well as meropenem.  Cultures were also ordered by them.   I have personally seen and evaluated the patient, evaluated laboratory and imaging results, formulated the assessment and plan and placed orders. The Patient requires high complexity decision making with multiple systems involvement.  Rounds were done with the Respiratory Therapy Director  and Staff therapists and discussed with nursing staff also.  Time 35 minutes patient critically ill in danger of cardiac arrest and death  Allyne Gee, MD [facility] Pulmonary Critical Care Medicine Sleep Medicine

## 2019-11-25 DIAGNOSIS — U071 COVID-19: Secondary | ICD-10-CM | POA: Diagnosis not present

## 2019-11-25 DIAGNOSIS — A419 Sepsis, unspecified organism: Secondary | ICD-10-CM | POA: Diagnosis not present

## 2019-11-25 DIAGNOSIS — I4891 Unspecified atrial fibrillation: Secondary | ICD-10-CM | POA: Diagnosis not present

## 2019-11-25 DIAGNOSIS — J9621 Acute and chronic respiratory failure with hypoxia: Secondary | ICD-10-CM | POA: Diagnosis not present

## 2019-11-25 LAB — CBC
HCT: 25.6 % — ABNORMAL LOW (ref 39.0–52.0)
Hemoglobin: 8 g/dL — ABNORMAL LOW (ref 13.0–17.0)
MCH: 26.3 pg (ref 26.0–34.0)
MCHC: 31.3 g/dL (ref 30.0–36.0)
MCV: 84.2 fL (ref 80.0–100.0)
Platelets: 190 10*3/uL (ref 150–400)
RBC: 3.04 MIL/uL — ABNORMAL LOW (ref 4.22–5.81)
RDW: 26.2 % — ABNORMAL HIGH (ref 11.5–15.5)
WBC: 31.7 10*3/uL — ABNORMAL HIGH (ref 4.0–10.5)
nRBC: 0.1 % (ref 0.0–0.2)

## 2019-11-25 LAB — BLOOD GAS, ARTERIAL
Acid-base deficit: 1.2 mmol/L (ref 0.0–2.0)
Bicarbonate: 22.5 mmol/L (ref 20.0–28.0)
FIO2: 28
O2 Saturation: 90.1 %
Patient temperature: 37.3
pCO2 arterial: 34.8 mmHg (ref 32.0–48.0)
pH, Arterial: 7.427 (ref 7.350–7.450)
pO2, Arterial: 59.4 mmHg — ABNORMAL LOW (ref 83.0–108.0)

## 2019-11-25 LAB — BASIC METABOLIC PANEL
Anion gap: 5 (ref 5–15)
BUN: 63 mg/dL — ABNORMAL HIGH (ref 8–23)
CO2: 21 mmol/L — ABNORMAL LOW (ref 22–32)
Calcium: 8 mg/dL — ABNORMAL LOW (ref 8.9–10.3)
Chloride: 122 mmol/L — ABNORMAL HIGH (ref 98–111)
Creatinine, Ser: 1.06 mg/dL (ref 0.61–1.24)
GFR calc Af Amer: >60 mL/min (ref >60)
GFR calc non Af Amer: >60 mL/min (ref >60)
Glucose, Bld: 183 mg/dL — ABNORMAL HIGH (ref 70–99)
Potassium: 4.4 mmol/L (ref 3.5–5.1)
Sodium: 148 mmol/L — ABNORMAL HIGH (ref 135–145)

## 2019-11-25 LAB — MAGNESIUM: Magnesium: 2 mg/dL (ref 1.7–2.4)

## 2019-11-25 NOTE — Progress Notes (Addendum)
Pulmonary Critical Care Medicine [place]   PULMONARY CRITICAL CARE SERVICE  PROGRESS NOTE  Date of Service: 11/25/2019  Izaias Michalski  [id number]  DOB: 1946-07-30   DOA: 11/21/2019  Referring Physician: [name], MD  HPI: Foley Arwood is a 74 y.o. male seen for follow up of Acute on Chronic Respiratory Failure.  Patient was able to do 2 hours on pressure support today 35% FiO2 and is now back resting on ventilator issues at this time.  Medications: Reviewed on Rounds  Physical Exam:  Vitals: Pulse 85 respirations 30 BP 138/72 O2 sat 100% temp 98.1  Ventilator Settings ventilator mode AC VC rate of 18 tidal volume 470 PEEP 5 FiO2 35%  . General: Comfortable at this time . Eyes: Grossly normal lids, irises & conjunctiva . ENT: grossly tongue is normal . Neck: no obvious mass . Cardiovascular: S1 S2 normal no gallop . Respiratory: No rales or rhonchi noted . Abdomen: soft . Skin: no rash seen on limited exam . Musculoskeletal: not rigid . Psychiatric:unable to assess . Neurologic: no seizure no involuntary movements         Lab Data:   Basic Metabolic Panel: Recent Labs  Lab 11/22/19 0617 11/24/19 0753 11/25/19 0714  NA 148* 153* 148*  K 4.4 4.5 4.4  CL 121* 124* 122*  CO2 20* 22 21*  GLUCOSE 134* 231* 183*  BUN 48* 58* 63*  CREATININE 0.99 1.07 1.06  CALCIUM 7.9* 8.1* 8.0*  MG  --  2.0 2.0    ABG: Recent Labs  Lab 11/24/19 1313 11/25/19 0330  PHART 7.459* 7.427  PCO2ART 30.6* 34.8  PO2ART 123* 59.4*  HCO3 21.6 22.5  O2SAT 99.1 90.1    Liver Function Tests: Recent Labs  Lab 11/22/19 0617  AST 36  ALT 39  ALKPHOS 161*  BILITOT 0.8  PROT 5.1*  ALBUMIN 1.3*   No results for input(s): LIPASE, AMYLASE in the last 168 hours. No results for input(s): AMMONIA in the last 168 hours.  CBC: Recent Labs  Lab 11/22/19 0617 11/24/19 0753 11/25/19 0714  WBC 32.4* 39.9* 31.7*  HGB 8.7* 9.0* 8.0*  HCT 26.6* 27.7* 25.6*   MCV 81.8 82.7 84.2  PLT 253 215 190    Cardiac Enzymes: No results for input(s): CKTOTAL, CKMB, CKMBINDEX, TROPONINI in the last 168 hours.  BNP (last 3 results) No results for input(s): BNP in the last 8760 hours.  ProBNP (last 3 results) No results for input(s): PROBNP in the last 8760 hours.  Radiological Exams: DG CHEST PORT 1 VIEW  Result Date: 11/24/2019 CLINICAL DATA:  Pneumonia. EXAM: PORTABLE CHEST 1 VIEW COMPARISON:  One-view chest x-ray 11/22/2019 FINDINGS: Tracheostomy tube right-sided PICC line are stable. Heart mildly enlarged. Predominantly left lower lobe airspace disease is again seen. Mild disease is present at the right base. There is no significant interval change. IMPRESSION: 1. Stable appearance of left greater than right airspace disease. 2. Support apparatus is stable. Electronically Signed   By: San Morelle M.D.   On: 11/24/2019 09:35    Assessment/Plan Active Problems:   Acute on chronic respiratory failure with hypoxia (HCC)   COVID-19 virus infection   Atrial fibrillation with RVR (HCC)   Severe sepsis (HCC)   Pneumonia due to COVID-19 virus   1. Acute on chronic respiratory failure with hypoxia patient is weaning well and completed 2 hours goal on pressure support today 12 5 and FiO2 35% now resting back on the full support mode assist control  mode rate of 18 tidal volume 470 PEEP of 5 FiO2 35%.  We will continue to wean as per protocol.  Continue supportive measures and pulmonary toilet. 2. Covid-19 viral infection in recovery phase continue supportive care 3. Pneumonia due to Covid-19 ongoing work-up 4. Atrial fibrillation with RVR controlled at this time continue supportive care 5. Severe sepsis seen by infectious disease on vancomycin and meropenem continue supportive measures.   I have personally seen and evaluated the patient, evaluated laboratory and imaging results, formulated the assessment and plan and placed orders. The Patient  requires high complexity decision making with multiple systems involvement.  Rounds were done with the Respiratory Therapy Director and Staff therapists and discussed with nursing staff also.  Allyne Gee, MD [facility] Pulmonary Critical Care Medicine Sleep Medicine

## 2019-11-26 ENCOUNTER — Other Ambulatory Visit (HOSPITAL_COMMUNITY): Payer: Medicare HMO

## 2019-11-26 DIAGNOSIS — I4891 Unspecified atrial fibrillation: Secondary | ICD-10-CM | POA: Diagnosis not present

## 2019-11-26 DIAGNOSIS — J9621 Acute and chronic respiratory failure with hypoxia: Secondary | ICD-10-CM | POA: Diagnosis not present

## 2019-11-26 DIAGNOSIS — U071 COVID-19: Secondary | ICD-10-CM | POA: Diagnosis not present

## 2019-11-26 DIAGNOSIS — A419 Sepsis, unspecified organism: Secondary | ICD-10-CM | POA: Diagnosis not present

## 2019-11-26 LAB — BLOOD GAS, ARTERIAL
Acid-base deficit: 0 mmol/L (ref 0.0–2.0)
Bicarbonate: 23.6 mmol/L (ref 20.0–28.0)
FIO2: 45
O2 Saturation: 93.9 %
Patient temperature: 37
pCO2 arterial: 34.7 mmHg (ref 32.0–48.0)
pH, Arterial: 7.446 (ref 7.350–7.450)
pO2, Arterial: 67.3 mmHg — ABNORMAL LOW (ref 83.0–108.0)

## 2019-11-26 LAB — BASIC METABOLIC PANEL
Anion gap: 5 (ref 5–15)
BUN: 66 mg/dL — ABNORMAL HIGH (ref 8–23)
CO2: 22 mmol/L (ref 22–32)
Calcium: 7.9 mg/dL — ABNORMAL LOW (ref 8.9–10.3)
Chloride: 121 mmol/L — ABNORMAL HIGH (ref 98–111)
Creatinine, Ser: 0.99 mg/dL (ref 0.61–1.24)
GFR calc Af Amer: >60 mL/min (ref >60)
GFR calc non Af Amer: >60 mL/min (ref >60)
Glucose, Bld: 139 mg/dL — ABNORMAL HIGH (ref 70–99)
Potassium: 4.4 mmol/L (ref 3.5–5.1)
Sodium: 148 mmol/L — ABNORMAL HIGH (ref 135–145)

## 2019-11-26 MED ORDER — GENERIC EXTERNAL MEDICATION
Status: DC
Start: ? — End: 2019-11-26

## 2019-11-26 NOTE — Progress Notes (Signed)
Pulmonary Critical Care Medicine [place]   PULMONARY CRITICAL CARE SERVICE  PROGRESS NOTE  Date of Service: 11/26/2019  Shane Banks  [id number]  DOB: 16-Apr-1946   DOA: 11/21/2019  Referring Physician: [name], MD  HPI: Shane Banks is a 74 y.o. male seen for follow up of Acute on Chronic Respiratory Failure.  Patient currently is on full support on assist control mode has been on 30% FiO2 with a PEEP of 5 good tidal volumes are noted at this time  Medications: Reviewed on Rounds  Physical Exam:  Vitals: Temperature is 98.2 pulse 71 respiratory rate 31 blood pressure is 136/62 saturations 100%  Ventilator Settings on assist control with an FiO2 30% tidal volume is 436 PEEP 5  . General: Comfortable at this time . Eyes: Grossly normal lids, irises & conjunctiva . ENT: grossly tongue is normal . Neck: no obvious mass . Cardiovascular: S1 S2 normal no gallop . Respiratory: No rhonchi coarse breath sounds are noted at this time . Abdomen: soft . Skin: no rash seen on limited exam . Musculoskeletal: not rigid . Psychiatric:unable to assess . Neurologic: no seizure no involuntary movements         Lab Data:   Basic Metabolic Panel: Recent Labs  Lab 11/22/19 0617 11/24/19 0753 11/25/19 0714 11/26/19 0404  NA 148* 153* 148* 148*  K 4.4 4.5 4.4 4.4  CL 121* 124* 122* 121*  CO2 20* 22 21* 22  GLUCOSE 134* 231* 183* 139*  BUN 48* 58* 63* 66*  CREATININE 0.99 1.07 1.06 0.99  CALCIUM 7.9* 8.1* 8.0* 7.9*  MG  --  2.0 2.0  --     ABG: Recent Labs  Lab 11/24/19 1313 11/25/19 0330  PHART 7.459* 7.427  PCO2ART 30.6* 34.8  PO2ART 123* 59.4*  HCO3 21.6 22.5  O2SAT 99.1 90.1    Liver Function Tests: Recent Labs  Lab 11/22/19 0617  AST 36  ALT 39  ALKPHOS 161*  BILITOT 0.8  PROT 5.1*  ALBUMIN 1.3*   No results for input(s): LIPASE, AMYLASE in the last 168 hours. No results for input(s): AMMONIA in the last 168  hours.  CBC: Recent Labs  Lab 11/22/19 0617 11/24/19 0753 11/25/19 0714  WBC 32.4* 39.9* 31.7*  HGB 8.7* 9.0* 8.0*  HCT 26.6* 27.7* 25.6*  MCV 81.8 82.7 84.2  PLT 253 215 190    Cardiac Enzymes: No results for input(s): CKTOTAL, CKMB, CKMBINDEX, TROPONINI in the last 168 hours.  BNP (last 3 results) No results for input(s): BNP in the last 8760 hours.  ProBNP (last 3 results) No results for input(s): PROBNP in the last 8760 hours.  Radiological Exams: No results found.  Assessment/Plan Active Problems:   Acute on chronic respiratory failure with hypoxia (HCC)   COVID-19 virus infection   Atrial fibrillation with RVR (HCC)   Severe sepsis (HCC)   Pneumonia due to COVID-19 virus   1. Acute on chronic respiratory failure hypoxia patient will continue on full support on assist control mode titrate oxygen as tolerated 2. COVID-19 virus infection treated in resolution phase 3. Atrial fibrillation rate controlled 4. Severe sepsis hemodynamics are stable 5. Pneumonia due to COVID-19 treated we will continue to follow   I have personally seen and evaluated the patient, evaluated laboratory and imaging results, formulated the assessment and plan and placed orders. The Patient requires high complexity decision making with multiple systems involvement.  Rounds were done with the Respiratory Therapy Director and Staff therapists and discussed  with nursing staff also.  Allyne Gee, MD [facility] Pulmonary Critical Care Medicine Sleep Medicine

## 2019-11-27 DIAGNOSIS — J9621 Acute and chronic respiratory failure with hypoxia: Secondary | ICD-10-CM | POA: Diagnosis not present

## 2019-11-27 DIAGNOSIS — I4891 Unspecified atrial fibrillation: Secondary | ICD-10-CM | POA: Diagnosis not present

## 2019-11-27 DIAGNOSIS — U071 COVID-19: Secondary | ICD-10-CM | POA: Diagnosis not present

## 2019-11-27 DIAGNOSIS — A419 Sepsis, unspecified organism: Secondary | ICD-10-CM | POA: Diagnosis not present

## 2019-11-27 LAB — CULTURE, BLOOD (ROUTINE X 2)
Culture: NO GROWTH
Culture: NO GROWTH

## 2019-11-27 LAB — BASIC METABOLIC PANEL
Anion gap: 5 (ref 5–15)
BUN: 64 mg/dL — ABNORMAL HIGH (ref 8–23)
CO2: 24 mmol/L (ref 22–32)
Calcium: 8 mg/dL — ABNORMAL LOW (ref 8.9–10.3)
Chloride: 120 mmol/L — ABNORMAL HIGH (ref 98–111)
Creatinine, Ser: 0.98 mg/dL (ref 0.61–1.24)
GFR calc Af Amer: >60 mL/min (ref >60)
GFR calc non Af Amer: >60 mL/min (ref >60)
Glucose, Bld: 183 mg/dL — ABNORMAL HIGH (ref 70–99)
Potassium: 4.5 mmol/L (ref 3.5–5.1)
Sodium: 149 mmol/L — ABNORMAL HIGH (ref 135–145)

## 2019-11-27 LAB — CBC
HCT: 24 % — ABNORMAL LOW (ref 39.0–52.0)
Hemoglobin: 7.4 g/dL — ABNORMAL LOW (ref 13.0–17.0)
MCH: 26.5 pg (ref 26.0–34.0)
MCHC: 30.8 g/dL (ref 30.0–36.0)
MCV: 86 fL (ref 80.0–100.0)
Platelets: 171 10*3/uL (ref 150–400)
RBC: 2.79 MIL/uL — ABNORMAL LOW (ref 4.22–5.81)
RDW: 26 % — ABNORMAL HIGH (ref 11.5–15.5)
WBC: 18.8 10*3/uL — ABNORMAL HIGH (ref 4.0–10.5)
nRBC: 0.4 % — ABNORMAL HIGH (ref 0.0–0.2)

## 2019-11-27 LAB — MAGNESIUM: Magnesium: 1.7 mg/dL (ref 1.7–2.4)

## 2019-11-27 NOTE — Progress Notes (Signed)
Pulmonary Critical Care Medicine [place]   PULMONARY CRITICAL CARE SERVICE  PROGRESS NOTE  Date of Service: 11/27/2019  Brianne Muhs  [id number]  DOB: 01-Sep-1946   DOA: 11/21/2019  Referring Physician: [name], MD  HPI: Daniyel Goodie is a 74 y.o. male seen for follow up of Acute on Chronic Respiratory Failure.  Patient is on pressure support 12/5 right now is on a goal of 8 hours  Medications: Reviewed on Rounds  Physical Exam:  Vitals: Temperature is 97.0 pulse 61 respiratory 23 blood pressure is 120/64 saturations are 99%  Ventilator Settings mode of ventilation pressure support FiO2 40% pressure support 12 PEEP 5  . General: Comfortable at this time . Eyes: Grossly normal lids, irises & conjunctiva . ENT: grossly tongue is normal . Neck: no obvious mass . Cardiovascular: S1 S2 normal no gallop . Respiratory: Coarse breath sounds few rhonchi . Abdomen: soft . Skin: no rash seen on limited exam . Musculoskeletal: not rigid . Psychiatric:unable to assess . Neurologic: no seizure no involuntary movements         Lab Data:   Basic Metabolic Panel: Recent Labs  Lab 11/22/19 0617 11/24/19 0753 11/25/19 0714 11/26/19 0404 11/27/19 0414  NA 148* 153* 148* 148* 149*  K 4.4 4.5 4.4 4.4 4.5  CL 121* 124* 122* 121* 120*  CO2 20* 22 21* 22 24  GLUCOSE 134* 231* 183* 139* 183*  BUN 48* 58* 63* 66* 64*  CREATININE 0.99 1.07 1.06 0.99 0.98  CALCIUM 7.9* 8.1* 8.0* 7.9* 8.0*  MG  --  2.0 2.0  --  1.7    ABG: Recent Labs  Lab 11/24/19 1313 11/25/19 0330 11/26/19 1211  PHART 7.459* 7.427 7.446  PCO2ART 30.6* 34.8 34.7  PO2ART 123* 59.4* 67.3*  HCO3 21.6 22.5 23.6  O2SAT 99.1 90.1 93.9    Liver Function Tests: Recent Labs  Lab 11/22/19 0617  AST 36  ALT 39  ALKPHOS 161*  BILITOT 0.8  PROT 5.1*  ALBUMIN 1.3*   No results for input(s): LIPASE, AMYLASE in the last 168 hours. No results for input(s): AMMONIA in the last 168  hours.  CBC: Recent Labs  Lab 11/22/19 0617 11/24/19 0753 11/25/19 0714 11/27/19 0414  WBC 32.4* 39.9* 31.7* 18.8*  HGB 8.7* 9.0* 8.0* 7.4*  HCT 26.6* 27.7* 25.6* 24.0*  MCV 81.8 82.7 84.2 86.0  PLT 253 215 190 171    Cardiac Enzymes: No results for input(s): CKTOTAL, CKMB, CKMBINDEX, TROPONINI in the last 168 hours.  BNP (last 3 results) No results for input(s): BNP in the last 8760 hours.  ProBNP (last 3 results) No results for input(s): PROBNP in the last 8760 hours.  Radiological Exams: DG Chest Port 1 View  Result Date: 11/26/2019 CLINICAL DATA:  Respiratory distress EXAM: PORTABLE CHEST 1 VIEW COMPARISON:  11/24/2019 FINDINGS: Tracheostomy tube in satisfactory position. Left lower lobe consolidation. Right basilar airspace disease. No definite pleural effusion. No pneumothorax. Stable cardiomediastinal silhouette. No aggressive osseous lesion. IMPRESSION: 1. Bilateral lower lobe pneumonia more extensive in the left lower lobe. Electronically Signed   By: Kathreen Devoid   On: 11/26/2019 12:48    Assessment/Plan Active Problems:   Acute on chronic respiratory failure with hypoxia (HCC)   COVID-19 virus infection   Atrial fibrillation with RVR (HCC)   Severe sepsis (HCC)   Pneumonia due to COVID-19 virus   1. Acute on chronic respiratory failure with hypoxia we will continue with full support on the ventilator right now  he is weaning on 8-hour goal 2. COVID-19 virus infection in resolution phase 3. Atrial fibrillation rate controlled 4. Severe sepsis resolving 5. Pneumonia due to COVID-19 improving but still has significant infiltrates on the last chest x-ray   I have personally seen and evaluated the patient, evaluated laboratory and imaging results, formulated the assessment and plan and placed orders. The Patient requires high complexity decision making with multiple systems involvement.  Rounds were done with the Respiratory Therapy Director and Staff therapists  and discussed with nursing staff also.  Allyne Gee, MD [facility] Pulmonary Critical Care Medicine Sleep Medicine

## 2019-11-28 DIAGNOSIS — I4891 Unspecified atrial fibrillation: Secondary | ICD-10-CM | POA: Diagnosis not present

## 2019-11-28 DIAGNOSIS — A419 Sepsis, unspecified organism: Secondary | ICD-10-CM | POA: Diagnosis not present

## 2019-11-28 DIAGNOSIS — J9621 Acute and chronic respiratory failure with hypoxia: Secondary | ICD-10-CM | POA: Diagnosis not present

## 2019-11-28 DIAGNOSIS — U071 COVID-19: Secondary | ICD-10-CM | POA: Diagnosis not present

## 2019-11-28 LAB — BASIC METABOLIC PANEL
Anion gap: 4 — ABNORMAL LOW (ref 5–15)
BUN: 65 mg/dL — ABNORMAL HIGH (ref 8–23)
CO2: 25 mmol/L (ref 22–32)
Calcium: 8 mg/dL — ABNORMAL LOW (ref 8.9–10.3)
Chloride: 118 mmol/L — ABNORMAL HIGH (ref 98–111)
Creatinine, Ser: 0.96 mg/dL (ref 0.61–1.24)
GFR calc Af Amer: >60 mL/min (ref >60)
GFR calc non Af Amer: >60 mL/min (ref >60)
Glucose, Bld: 153 mg/dL — ABNORMAL HIGH (ref 70–99)
Potassium: 4.8 mmol/L (ref 3.5–5.1)
Sodium: 147 mmol/L — ABNORMAL HIGH (ref 135–145)

## 2019-11-28 LAB — CBC
HCT: 23.1 % — ABNORMAL LOW (ref 39.0–52.0)
Hemoglobin: 7.2 g/dL — ABNORMAL LOW (ref 13.0–17.0)
MCH: 26.7 pg (ref 26.0–34.0)
MCHC: 31.2 g/dL (ref 30.0–36.0)
MCV: 85.6 fL (ref 80.0–100.0)
Platelets: 181 10*3/uL (ref 150–400)
RBC: 2.7 MIL/uL — ABNORMAL LOW (ref 4.22–5.81)
RDW: 25.9 % — ABNORMAL HIGH (ref 11.5–15.5)
WBC: 14.5 10*3/uL — ABNORMAL HIGH (ref 4.0–10.5)
nRBC: 0.6 % — ABNORMAL HIGH (ref 0.0–0.2)

## 2019-11-28 LAB — OCCULT BLOOD X 1 CARD TO LAB, STOOL: Fecal Occult Bld: POSITIVE — AB

## 2019-11-28 LAB — MAGNESIUM: Magnesium: 1.9 mg/dL (ref 1.7–2.4)

## 2019-11-28 LAB — PHOSPHORUS: Phosphorus: 2.2 mg/dL — ABNORMAL LOW (ref 2.5–4.6)

## 2019-11-28 NOTE — Progress Notes (Signed)
Pulmonary Critical Care Medicine [place]   PULMONARY CRITICAL CARE SERVICE  PROGRESS NOTE  Date of Service: 11/28/2019  Shane Banks  [id number]  DOB: 1946/09/07   DOA: 11/21/2019  Referring Physician: [name], MD  HPI: Shane Banks is a 74 y.o. male seen for follow up of Acute on Chronic Respiratory Failure.  Patient is currently on pressure support mode on weaning protocol today the goal is for 12 hours  Medications: Reviewed on Rounds  Physical Exam:  Vitals: Temperature is 97.9 pulse 70 respiratory 38 blood pressure is 130/75 saturations 99%  Ventilator Settings mode ventilation pressure support FiO2 is 35% tidal volume 541 pressure support 12 PEEP 5  . General: Comfortable at this time . Eyes: Grossly normal lids, irises & conjunctiva . ENT: grossly tongue is normal . Neck: no obvious mass . Cardiovascular: S1 S2 normal no gallop . Respiratory: No rhonchi no rales are noted at this time . Abdomen: soft . Skin: no rash seen on limited exam . Musculoskeletal: not rigid . Psychiatric:unable to assess . Neurologic: no seizure no involuntary movements         Lab Data:   Basic Metabolic Panel: Recent Labs  Lab 11/24/19 0753 11/25/19 0714 11/26/19 0404 11/27/19 0414 11/28/19 0601  NA 153* 148* 148* 149* 147*  K 4.5 4.4 4.4 4.5 4.8  CL 124* 122* 121* 120* 118*  CO2 22 21* 22 24 25   GLUCOSE 231* 183* 139* 183* 153*  BUN 58* 63* 66* 64* 65*  CREATININE 1.07 1.06 0.99 0.98 0.96  CALCIUM 8.1* 8.0* 7.9* 8.0* 8.0*  MG 2.0 2.0  --  1.7 1.9  PHOS  --   --   --   --  2.2*    ABG: Recent Labs  Lab 11/24/19 1313 11/25/19 0330 11/26/19 1211  PHART 7.459* 7.427 7.446  PCO2ART 30.6* 34.8 34.7  PO2ART 123* 59.4* 67.3*  HCO3 21.6 22.5 23.6  O2SAT 99.1 90.1 93.9    Liver Function Tests: Recent Labs  Lab 11/22/19 0617  AST 36  ALT 39  ALKPHOS 161*  BILITOT 0.8  PROT 5.1*  ALBUMIN 1.3*   No results for input(s): LIPASE, AMYLASE  in the last 168 hours. No results for input(s): AMMONIA in the last 168 hours.  CBC: Recent Labs  Lab 11/22/19 0617 11/24/19 0753 11/25/19 0714 11/27/19 0414 11/28/19 0601  WBC 32.4* 39.9* 31.7* 18.8* 14.5*  HGB 8.7* 9.0* 8.0* 7.4* 7.2*  HCT 26.6* 27.7* 25.6* 24.0* 23.1*  MCV 81.8 82.7 84.2 86.0 85.6  PLT 253 215 190 171 181    Cardiac Enzymes: No results for input(s): CKTOTAL, CKMB, CKMBINDEX, TROPONINI in the last 168 hours.  BNP (last 3 results) No results for input(s): BNP in the last 8760 hours.  ProBNP (last 3 results) No results for input(s): PROBNP in the last 8760 hours.  Radiological Exams: DG Chest Port 1 View  Result Date: 11/26/2019 CLINICAL DATA:  Respiratory distress EXAM: PORTABLE CHEST 1 VIEW COMPARISON:  11/24/2019 FINDINGS: Tracheostomy tube in satisfactory position. Left lower lobe consolidation. Right basilar airspace disease. No definite pleural effusion. No pneumothorax. Stable cardiomediastinal silhouette. No aggressive osseous lesion. IMPRESSION: 1. Bilateral lower lobe pneumonia more extensive in the left lower lobe. Electronically Signed   By: Kathreen Devoid   On: 11/26/2019 12:48    Assessment/Plan Active Problems:   Acute on chronic respiratory failure with hypoxia (HCC)   COVID-19 virus infection   Atrial fibrillation with RVR (HCC)   Severe sepsis ([place])  Pneumonia due to COVID-19 virus   1. Acute on chronic respiratory failure with hypoxia plan is to continue with weaning on protocol 12-hour goal on pressure support. 2. COVID-19 virus infection treated in resolution phase 3. Atrial fibrillation rate controlled 4. Severe sepsis hemodynamics are stable 5. Pneumonia due to COVID-19 treated we will continue with supportive care   I have personally seen and evaluated the patient, evaluated laboratory and imaging results, formulated the assessment and plan and placed orders. The Patient requires high complexity decision making with multiple  systems involvement.  Rounds were done with the Respiratory Therapy Director and Staff therapists and discussed with nursing staff also.  Allyne Gee, MD [facility] Pulmonary Critical Care Medicine Sleep Medicine

## 2019-11-29 ENCOUNTER — Other Ambulatory Visit (HOSPITAL_COMMUNITY): Payer: Medicare HMO

## 2019-11-29 DIAGNOSIS — I4891 Unspecified atrial fibrillation: Secondary | ICD-10-CM | POA: Diagnosis not present

## 2019-11-29 DIAGNOSIS — J9621 Acute and chronic respiratory failure with hypoxia: Secondary | ICD-10-CM | POA: Diagnosis not present

## 2019-11-29 DIAGNOSIS — A419 Sepsis, unspecified organism: Secondary | ICD-10-CM | POA: Diagnosis not present

## 2019-11-29 DIAGNOSIS — U071 COVID-19: Secondary | ICD-10-CM | POA: Diagnosis not present

## 2019-11-29 LAB — BASIC METABOLIC PANEL
Anion gap: 8 (ref 5–15)
BUN: 69 mg/dL — ABNORMAL HIGH (ref 8–23)
CO2: 26 mmol/L (ref 22–32)
Calcium: 8.3 mg/dL — ABNORMAL LOW (ref 8.9–10.3)
Chloride: 114 mmol/L — ABNORMAL HIGH (ref 98–111)
Creatinine, Ser: 0.9 mg/dL (ref 0.61–1.24)
GFR calc Af Amer: >60 mL/min (ref >60)
GFR calc non Af Amer: >60 mL/min (ref >60)
Glucose, Bld: 145 mg/dL — ABNORMAL HIGH (ref 70–99)
Potassium: 4.4 mmol/L (ref 3.5–5.1)
Sodium: 148 mmol/L — ABNORMAL HIGH (ref 135–145)

## 2019-11-29 LAB — CBC
HCT: 23.2 % — ABNORMAL LOW (ref 39.0–52.0)
Hemoglobin: 7.3 g/dL — ABNORMAL LOW (ref 13.0–17.0)
MCH: 27.1 pg (ref 26.0–34.0)
MCHC: 31.5 g/dL (ref 30.0–36.0)
MCV: 86.2 fL (ref 80.0–100.0)
Platelets: UNDETERMINED 10*3/uL (ref 150–400)
RBC: 2.69 MIL/uL — ABNORMAL LOW (ref 4.22–5.81)
RDW: 25.3 % — ABNORMAL HIGH (ref 11.5–15.5)
WBC: 9.6 10*3/uL (ref 4.0–10.5)
nRBC: 0.7 % — ABNORMAL HIGH (ref 0.0–0.2)

## 2019-11-29 LAB — MAGNESIUM: Magnesium: 1.7 mg/dL (ref 1.7–2.4)

## 2019-11-29 NOTE — Progress Notes (Signed)
Pulmonary Critical Care Medicine [place]   PULMONARY CRITICAL CARE SERVICE  PROGRESS NOTE  Date of Service: 11/29/2019  Shane Banks  [id number]  DOB: 1946-09-24   DOA: 11/21/2019  Referring Physician: [name], MD  HPI: Shane Banks is a 74 y.o. male seen for follow up of Acute on Chronic Respiratory Failure.  Patient is on full support on the ventilator at this time appears to be comfortable we will check the RSB I today  Medications: Reviewed on Rounds  Physical Exam:  Vitals: Temperature is 97.4 pulse 69 respiratory rate 30 blood pressure is 142/70 saturations 97%  Ventilator Settings mode ventilation assist control FiO2 30% tidal volume 470 PEEP 5  . General: Comfortable at this time . Eyes: Grossly normal lids, irises & conjunctiva . ENT: grossly tongue is normal . Neck: no obvious mass . Cardiovascular: S1 S2 normal no gallop . Respiratory: Scattered rhonchi expansion is equal . Abdomen: soft . Skin: no rash seen on limited exam . Musculoskeletal: not rigid . Psychiatric:unable to assess . Neurologic: no seizure no involuntary movements         Lab Data:   Basic Metabolic Panel: Recent Labs  Lab 11/24/19 0753 11/24/19 0753 11/25/19 0714 11/26/19 0404 11/27/19 0414 11/28/19 0601 11/29/19 0940  NA 153*   < > 148* 148* 149* 147* 148*  K 4.5   < > 4.4 4.4 4.5 4.8 4.4  CL 124*   < > 122* 121* 120* 118* 114*  CO2 22   < > 21* 22 24 25 26   GLUCOSE 231*   < > 183* 139* 183* 153* 145*  BUN 58*   < > 63* 66* 64* 65* 69*  CREATININE 1.07   < > 1.06 0.99 0.98 0.96 0.90  CALCIUM 8.1*   < > 8.0* 7.9* 8.0* 8.0* 8.3*  MG 2.0  --  2.0  --  1.7 1.9 1.7  PHOS  --   --   --   --   --  2.2*  --    < > = values in this interval not displayed.    ABG: Recent Labs  Lab 11/24/19 1313 11/25/19 0330 11/26/19 1211  PHART 7.459* 7.427 7.446  PCO2ART 30.6* 34.8 34.7  PO2ART 123* 59.4* 67.3*  HCO3 21.6 22.5 23.6  O2SAT 99.1 90.1 93.9     Liver Function Tests: No results for input(s): AST, ALT, ALKPHOS, BILITOT, PROT, ALBUMIN in the last 168 hours. No results for input(s): LIPASE, AMYLASE in the last 168 hours. No results for input(s): AMMONIA in the last 168 hours.  CBC: Recent Labs  Lab 11/24/19 0753 11/25/19 0714 11/27/19 0414 11/28/19 0601 11/29/19 0940  WBC 39.9* 31.7* 18.8* 14.5* 9.6  HGB 9.0* 8.0* 7.4* 7.2* 7.3*  HCT 27.7* 25.6* 24.0* 23.1* 23.2*  MCV 82.7 84.2 86.0 85.6 86.2  PLT 215 190 171 181 PLATELET CLUMPS NOTED ON SMEAR, UNABLE TO ESTIMATE    Cardiac Enzymes: No results for input(s): CKTOTAL, CKMB, CKMBINDEX, TROPONINI in the last 168 hours.  BNP (last 3 results) No results for input(s): BNP in the last 8760 hours.  ProBNP (last 3 results) No results for input(s): PROBNP in the last 8760 hours.  Radiological Exams: DG CHEST PORT 1 VIEW  Result Date: 11/29/2019 CLINICAL DATA:  Ventilator dependent respiratory failure and pneumonia. COVID-19. EXAM: PORTABLE CHEST 1 VIEW COMPARISON:  One-view chest x-ray 11/26/2019 FINDINGS: Tracheostomy tube is stable. Heart is enlarged. Atherosclerotic calcifications are present. Left greater than right interstitial and airspace  disease is similar to the prior study. Bilateral effusions are noted. IMPRESSION: 1. Stable appearance of left greater than right bilateral lower lobe airspace disease/pneumonia. 2. Bilateral effusions. 3. Aortic atherosclerosis. 4. Tracheostomy tube is stable. Electronically Signed   By: San Morelle M.D.   On: 11/29/2019 06:16    Assessment/Plan Active Problems:   Acute on chronic respiratory failure with hypoxia (HCC)   COVID-19 virus infection   Atrial fibrillation with RVR (HCC)   Severe sepsis (HCC)   Pneumonia due to COVID-19 virus   1. Acute on chronic respiratory failure hypoxia we will continue with full support on the ventilator check RSB I and wean again patient was placed to do 12 hours yesterday 2. COVID-19  virus infection at baseline we will continue with supportive care 3. Atrial fibrillation with RVR continue with supportive care 4. Severe sepsis resolved 5. Pneumonia due to COVID-19 we will continue to follow radiologically chest x-ray showing stability.   I have personally seen and evaluated the patient, evaluated laboratory and imaging results, formulated the assessment and plan and placed orders. The Patient requires high complexity decision making with multiple systems involvement.  Rounds were done with the Respiratory Therapy Director and Staff therapists and discussed with nursing staff also.  Allyne Gee, MD [facility] Pulmonary Critical Care Medicine Sleep Medicine

## 2019-11-30 DIAGNOSIS — A419 Sepsis, unspecified organism: Secondary | ICD-10-CM | POA: Diagnosis not present

## 2019-11-30 DIAGNOSIS — U071 COVID-19: Secondary | ICD-10-CM | POA: Diagnosis not present

## 2019-11-30 DIAGNOSIS — J9621 Acute and chronic respiratory failure with hypoxia: Secondary | ICD-10-CM | POA: Diagnosis not present

## 2019-11-30 DIAGNOSIS — I4891 Unspecified atrial fibrillation: Secondary | ICD-10-CM | POA: Diagnosis not present

## 2019-11-30 LAB — RENAL FUNCTION PANEL
Albumin: 1.3 g/dL — ABNORMAL LOW (ref 3.5–5.0)
Anion gap: 7 (ref 5–15)
BUN: 67 mg/dL — ABNORMAL HIGH (ref 8–23)
CO2: 26 mmol/L (ref 22–32)
Calcium: 8.2 mg/dL — ABNORMAL LOW (ref 8.9–10.3)
Chloride: 115 mmol/L — ABNORMAL HIGH (ref 98–111)
Creatinine, Ser: 0.86 mg/dL (ref 0.61–1.24)
GFR calc Af Amer: >60 mL/min (ref >60)
GFR calc non Af Amer: >60 mL/min (ref >60)
Glucose, Bld: 168 mg/dL — ABNORMAL HIGH (ref 70–99)
Phosphorus: 2.1 mg/dL — ABNORMAL LOW (ref 2.5–4.6)
Potassium: 4 mmol/L (ref 3.5–5.1)
Sodium: 148 mmol/L — ABNORMAL HIGH (ref 135–145)

## 2019-11-30 LAB — CBC
HCT: 22.4 % — ABNORMAL LOW (ref 39.0–52.0)
Hemoglobin: 7 g/dL — ABNORMAL LOW (ref 13.0–17.0)
MCH: 26.7 pg (ref 26.0–34.0)
MCHC: 31.3 g/dL (ref 30.0–36.0)
MCV: 85.5 fL (ref 80.0–100.0)
Platelets: 210 10*3/uL (ref 150–400)
RBC: 2.62 MIL/uL — ABNORMAL LOW (ref 4.22–5.81)
RDW: 25.3 % — ABNORMAL HIGH (ref 11.5–15.5)
WBC: 11.1 10*3/uL — ABNORMAL HIGH (ref 4.0–10.5)
nRBC: 0.7 % — ABNORMAL HIGH (ref 0.0–0.2)

## 2019-11-30 LAB — HEMOGLOBIN A1C
Hgb A1c MFr Bld: 6 % — ABNORMAL HIGH (ref 4.8–5.6)
Mean Plasma Glucose: 125.5 mg/dL

## 2019-11-30 LAB — PREPARE RBC (CROSSMATCH)

## 2019-11-30 LAB — TSH: TSH: 7.13 u[IU]/mL — ABNORMAL HIGH (ref 0.350–4.500)

## 2019-11-30 LAB — ABO/RH: ABO/RH(D): O POS

## 2019-11-30 LAB — MAGNESIUM: Magnesium: 1.6 mg/dL — ABNORMAL LOW (ref 1.7–2.4)

## 2019-11-30 NOTE — Progress Notes (Signed)
Pulmonary Critical Care Medicine [place]   PULMONARY CRITICAL CARE SERVICE  PROGRESS NOTE  Date of Service: 11/30/2019  Shane Banks  [id number]  DOB: 1946-01-27   DOA: 11/21/2019  Referring Physician: [name], MD  HPI: Shane Banks is a 74 y.o. male seen for follow up of Acute on Chronic Respiratory Failure.  Patient at this time is on the NAG has been on 2 L with goal of about 2 to 4 hours  Medications: Reviewed on Rounds  Physical Exam:  Vitals: Temperature is 96.8 pulse 69 respiratory rate 30 blood pressure is 145/24 saturations 98%  Ventilator Settings off the ventilator on the NAG on 2 L O2  . General: Comfortable at this time . Eyes: Grossly normal lids, irises & conjunctiva . ENT: grossly tongue is normal . Neck: no obvious mass . Cardiovascular: S1 S2 normal no gallop . Respiratory: No rhonchi no rales are noted at this time . Abdomen: soft . Skin: no rash seen on limited exam . Musculoskeletal: not rigid . Psychiatric:unable to assess . Neurologic: no seizure no involuntary movements         Lab Data:   Basic Metabolic Panel: Recent Labs  Lab 11/25/19 0714 11/25/19 0714 11/26/19 0404 11/27/19 0414 11/28/19 0601 11/29/19 0940 11/30/19 0656  NA 148*   < > 148* 149* 147* 148* 148*  K 4.4   < > 4.4 4.5 4.8 4.4 4.0  CL 122*   < > 121* 120* 118* 114* 115*  CO2 21*   < > 22 24 25 26 26   GLUCOSE 183*   < > 139* 183* 153* 145* 168*  BUN 63*   < > 66* 64* 65* 69* 67*  CREATININE 1.06   < > 0.99 0.98 0.96 0.90 0.86  CALCIUM 8.0*   < > 7.9* 8.0* 8.0* 8.3* 8.2*  MG 2.0  --   --  1.7 1.9 1.7 1.6*  PHOS  --   --   --   --  2.2*  --  2.1*   < > = values in this interval not displayed.    ABG: Recent Labs  Lab 11/24/19 1313 11/25/19 0330 11/26/19 1211  PHART 7.459* 7.427 7.446  PCO2ART 30.6* 34.8 34.7  PO2ART 123* 59.4* 67.3*  HCO3 21.6 22.5 23.6  O2SAT 99.1 90.1 93.9    Liver Function Tests: Recent Labs  Lab  11/30/19 0656  ALBUMIN 1.3*   No results for input(s): LIPASE, AMYLASE in the last 168 hours. No results for input(s): AMMONIA in the last 168 hours.  CBC: Recent Labs  Lab 11/25/19 0714 11/27/19 0414 11/28/19 0601 11/29/19 0940 11/30/19 0656  WBC 31.7* 18.8* 14.5* 9.6 11.1*  HGB 8.0* 7.4* 7.2* 7.3* 7.0*  HCT 25.6* 24.0* 23.1* 23.2* 22.4*  MCV 84.2 86.0 85.6 86.2 85.5  PLT 190 171 181 PLATELET CLUMPS NOTED ON SMEAR, UNABLE TO ESTIMATE 210    Cardiac Enzymes: No results for input(s): CKTOTAL, CKMB, CKMBINDEX, TROPONINI in the last 168 hours.  BNP (last 3 results) No results for input(s): BNP in the last 8760 hours.  ProBNP (last 3 results) No results for input(s): PROBNP in the last 8760 hours.  Radiological Exams: DG CHEST PORT 1 VIEW  Result Date: 11/29/2019 CLINICAL DATA:  Ventilator dependent respiratory failure and pneumonia. COVID-19. EXAM: PORTABLE CHEST 1 VIEW COMPARISON:  One-view chest x-ray 11/26/2019 FINDINGS: Tracheostomy tube is stable. Heart is enlarged. Atherosclerotic calcifications are present. Left greater than right interstitial and airspace disease is similar to  the prior study. Bilateral effusions are noted. IMPRESSION: 1. Stable appearance of left greater than right bilateral lower lobe airspace disease/pneumonia. 2. Bilateral effusions. 3. Aortic atherosclerosis. 4. Tracheostomy tube is stable. Electronically Signed   By: San Morelle M.D.   On: 11/29/2019 06:16    Assessment/Plan Active Problems:   Acute on chronic respiratory failure with hypoxia (HCC)   COVID-19 virus infection   Atrial fibrillation with RVR (HCC)   Severe sepsis (HCC)   Pneumonia due to COVID-19 virus   1. Acute on chronic respiratory failure with hypoxia we will continue with NAG titrate oxygen continue pulmonary toilet 2. COVID-19 virus infection now is in resolution phase 3. Atrial fibrillation with RVR 4. Severe sepsis continue to monitor 5. Pneumonia due to  COVID-19 treated we will continue to follow along   I have personally seen and evaluated the patient, evaluated laboratory and imaging results, formulated the assessment and plan and placed orders. The Patient requires high complexity decision making with multiple systems involvement.  Rounds were done with the Respiratory Therapy Director and Staff therapists and discussed with nursing staff also.  Allyne Gee, MD [facility] Pulmonary Critical Care Medicine Sleep Medicine

## 2019-12-01 DIAGNOSIS — A419 Sepsis, unspecified organism: Secondary | ICD-10-CM | POA: Diagnosis not present

## 2019-12-01 DIAGNOSIS — U071 COVID-19: Secondary | ICD-10-CM | POA: Diagnosis not present

## 2019-12-01 DIAGNOSIS — I4891 Unspecified atrial fibrillation: Secondary | ICD-10-CM | POA: Diagnosis not present

## 2019-12-01 DIAGNOSIS — J9621 Acute and chronic respiratory failure with hypoxia: Secondary | ICD-10-CM | POA: Diagnosis not present

## 2019-12-01 LAB — BASIC METABOLIC PANEL
Anion gap: 7 (ref 5–15)
BUN: 54 mg/dL — ABNORMAL HIGH (ref 8–23)
CO2: 27 mmol/L (ref 22–32)
Calcium: 8 mg/dL — ABNORMAL LOW (ref 8.9–10.3)
Chloride: 111 mmol/L (ref 98–111)
Creatinine, Ser: 0.8 mg/dL (ref 0.61–1.24)
GFR calc Af Amer: >60 mL/min (ref >60)
GFR calc non Af Amer: >60 mL/min (ref >60)
Glucose, Bld: 179 mg/dL — ABNORMAL HIGH (ref 70–99)
Potassium: 3.6 mmol/L (ref 3.5–5.1)
Sodium: 145 mmol/L (ref 135–145)

## 2019-12-01 LAB — CBC
HCT: 24.8 % — ABNORMAL LOW (ref 39.0–52.0)
Hemoglobin: 8 g/dL — ABNORMAL LOW (ref 13.0–17.0)
MCH: 26.9 pg (ref 26.0–34.0)
MCHC: 32.3 g/dL (ref 30.0–36.0)
MCV: 83.5 fL (ref 80.0–100.0)
Platelets: 216 10*3/uL (ref 150–400)
RBC: 2.97 MIL/uL — ABNORMAL LOW (ref 4.22–5.81)
RDW: 23.3 % — ABNORMAL HIGH (ref 11.5–15.5)
WBC: 10.5 10*3/uL (ref 4.0–10.5)
nRBC: 0.8 % — ABNORMAL HIGH (ref 0.0–0.2)

## 2019-12-01 LAB — TYPE AND SCREEN
ABO/RH(D): O POS
Antibody Screen: NEGATIVE
Unit division: 0

## 2019-12-01 LAB — BPAM RBC
Blood Product Expiration Date: 202104022359
ISSUE DATE / TIME: 202103041558
Unit Type and Rh: 5100

## 2019-12-01 LAB — MAGNESIUM: Magnesium: 1.5 mg/dL — ABNORMAL LOW (ref 1.7–2.4)

## 2019-12-01 NOTE — Progress Notes (Addendum)
Pulmonary Critical Care Medicine [place]   PULMONARY CRITICAL CARE SERVICE  PROGRESS NOTE  Date of Service: 12/01/2019  Brendan Gadson  [id number]  DOB: [birth year]   DOA: 11/21/2019  Referring Physician: [name], MD  HPI: Shane Banks is a 74 y.o. male seen for follow up of Acute on Chronic Respiratory Failure.  Patient remains on for 8-hour goal today currently on 2 L.  Currently satting well no distress.  Medications: Reviewed on Rounds  Physical Exam:  Vitals: Pulse 67 respirations 24 BP 146/80 797% temp 97.5  Ventilator Settings NAG 2 L  . General: Comfortable at this time . Eyes: Grossly normal lids, irises & conjunctiva . ENT: grossly tongue is normal . Neck: no obvious mass . Cardiovascular: S1 S2 normal no gallop . Respiratory: No rales or rhonchi noted . Abdomen: soft . Skin: no rash seen on limited exam . Musculoskeletal: not rigid . Psychiatric:unable to assess . Neurologic: no seizure no involuntary movements         Lab Data:   Basic Metabolic Panel: Recent Labs  Lab 11/27/19 0414 11/28/19 0601 11/29/19 0940 11/30/19 0656 12/01/19 0436  NA 149* 147* 148* 148* 145  K 4.5 4.8 4.4 4.0 3.6  CL 120* 118* 114* 115* 111  CO2 24 25 26 26 27   GLUCOSE 183* 153* 145* 168* 179*  BUN 64* 65* 69* 67* 54*  CREATININE 0.98 0.96 0.90 0.86 0.80  CALCIUM 8.0* 8.0* 8.3* 8.2* 8.0*  MG 1.7 1.9 1.7 1.6* 1.5*  PHOS  --  2.2*  --  2.1*  --     ABG: Recent Labs  Lab 11/25/19 0330 11/26/19 1211  PHART 7.427 7.446  PCO2ART 34.8 34.7  PO2ART 59.4* 67.3*  HCO3 22.5 23.6  O2SAT 90.1 93.9    Liver Function Tests: Recent Labs  Lab 11/30/19 0656  ALBUMIN 1.3*   No results for input(s): LIPASE, AMYLASE in the last 168 hours. No results for input(s): AMMONIA in the last 168 hours.  CBC: Recent Labs  Lab 11/27/19 0414 11/28/19 0601 11/29/19 0940 11/30/19 0656 12/01/19 0436  WBC 18.8* 14.5* 9.6 11.1* 10.5  HGB 7.4* 7.2* 7.3*  7.0* 8.0*  HCT 24.0* 23.1* 23.2* 22.4* 24.8*  MCV 86.0 85.6 86.2 85.5 83.5  PLT 171 181 PLATELET CLUMPS NOTED ON SMEAR, UNABLE TO ESTIMATE 210 216    Cardiac Enzymes: No results for input(s): CKTOTAL, CKMB, CKMBINDEX, TROPONINI in the last 168 hours.  BNP (last 3 results) No results for input(s): BNP in the last 8760 hours.  ProBNP (last 3 results) No results for input(s): PROBNP in the last 8760 hours.  Radiological Exams: No results found.  Assessment/Plan Active Problems:   Acute on chronic respiratory failure with hypoxia (HCC)   COVID-19 virus infection   Atrial fibrillation with RVR (HCC)   Severe sepsis (HCC)   Pneumonia due to COVID-19 virus   1. Acute on chronic respiratory failure with hypoxia we will continue with NAG titrate oxygen continue pulmonary toilet 2. COVID-19 virus infection now is in resolution phase 3. Atrial fibrillation with RVR 4. Severe sepsis continue to monitor 5. Pneumonia due to COVID-19 treated we will continue to follow along   I have personally seen and evaluated the patient, evaluated laboratory and imaging results, formulated the assessment and plan and placed orders. The Patient requires high complexity decision making with multiple systems involvement.  Rounds were done with the Respiratory Therapy Director and Staff therapists and discussed with nursing staff also.  Lynden Flemmer  Richardson Dopp, MD [facility] Pulmonary Critical Care Medicine Sleep Medicine

## 2019-12-02 DIAGNOSIS — A419 Sepsis, unspecified organism: Secondary | ICD-10-CM | POA: Diagnosis not present

## 2019-12-02 DIAGNOSIS — I4891 Unspecified atrial fibrillation: Secondary | ICD-10-CM | POA: Diagnosis not present

## 2019-12-02 DIAGNOSIS — J9621 Acute and chronic respiratory failure with hypoxia: Secondary | ICD-10-CM | POA: Diagnosis not present

## 2019-12-02 DIAGNOSIS — U071 COVID-19: Secondary | ICD-10-CM | POA: Diagnosis not present

## 2019-12-02 LAB — BASIC METABOLIC PANEL WITH GFR
Anion gap: 8 (ref 5–15)
BUN: 51 mg/dL — ABNORMAL HIGH (ref 8–23)
CO2: 26 mmol/L (ref 22–32)
Calcium: 8.1 mg/dL — ABNORMAL LOW (ref 8.9–10.3)
Chloride: 112 mmol/L — ABNORMAL HIGH (ref 98–111)
Creatinine, Ser: 0.91 mg/dL (ref 0.61–1.24)
GFR calc Af Amer: >60 mL/min
GFR calc non Af Amer: >60 mL/min
Glucose, Bld: 147 mg/dL — ABNORMAL HIGH (ref 70–99)
Potassium: 3.4 mmol/L — ABNORMAL LOW (ref 3.5–5.1)
Sodium: 146 mmol/L — ABNORMAL HIGH (ref 135–145)

## 2019-12-02 LAB — MAGNESIUM: Magnesium: 1.7 mg/dL (ref 1.7–2.4)

## 2019-12-02 LAB — LACTIC ACID, PLASMA: Lactic Acid, Venous: 0.7 mmol/L (ref 0.5–1.9)

## 2019-12-02 NOTE — Progress Notes (Addendum)
Pulmonary Critical Care Medicine [place]   PULMONARY CRITICAL CARE SERVICE  PROGRESS NOTE  Date of Service: 12/02/2019  Shane Banks  [id number]  DOB: November 01, 1945   DOA: 11/21/2019  Referring Physician: [name], MD  HPI: Shane Banks is a 74 y.o. male seen for follow up of Acute on Chronic Respiratory Failure.  Patient remains on NAG with 2 L/min for 12-hour goal today.  Satting well no fever or distress.  Medications: Reviewed on Rounds  Physical Exam:  Vitals: Pulse 84 respirations 26 BP 164/85 O2 sat 100% temp 97.7  Ventilator Settings NAG 2 L/min  . General: Comfortable at this time . Eyes: Grossly normal lids, irises & conjunctiva . ENT: grossly tongue is normal . Neck: no obvious mass . Cardiovascular: S1 S2 normal no gallop . Respiratory: No rales or rhonchi noted . Abdomen: soft . Skin: no rash seen on limited exam . Musculoskeletal: not rigid . Psychiatric:unable to assess . Neurologic: no seizure no involuntary movements         Lab Data:   Basic Metabolic Panel: Recent Labs  Lab 11/28/19 0601 11/29/19 0940 11/30/19 0656 12/01/19 0436 12/02/19 0606  NA 147* 148* 148* 145 146*  K 4.8 4.4 4.0 3.6 3.4*  CL 118* 114* 115* 111 112*  CO2 25 26 26 27 26   GLUCOSE 153* 145* 168* 179* 147*  BUN 65* 69* 67* 54* 51*  CREATININE 0.96 0.90 0.86 0.80 0.91  CALCIUM 8.0* 8.3* 8.2* 8.0* 8.1*  MG 1.9 1.7 1.6* 1.5* 1.7  PHOS 2.2*  --  2.1*  --   --     ABG: Recent Labs  Lab 11/26/19 1211  PHART 7.446  PCO2ART 34.7  PO2ART 67.3*  HCO3 23.6  O2SAT 93.9    Liver Function Tests: Recent Labs  Lab 11/30/19 0656  ALBUMIN 1.3*   No results for input(s): LIPASE, AMYLASE in the last 168 hours. No results for input(s): AMMONIA in the last 168 hours.  CBC: Recent Labs  Lab 11/27/19 0414 11/28/19 0601 11/29/19 0940 11/30/19 0656 12/01/19 0436  WBC 18.8* 14.5* 9.6 11.1* 10.5  HGB 7.4* 7.2* 7.3* 7.0* 8.0*  HCT 24.0* 23.1*  23.2* 22.4* 24.8*  MCV 86.0 85.6 86.2 85.5 83.5  PLT 171 181 PLATELET CLUMPS NOTED ON SMEAR, UNABLE TO ESTIMATE 210 216    Cardiac Enzymes: No results for input(s): CKTOTAL, CKMB, CKMBINDEX, TROPONINI in the last 168 hours.  BNP (last 3 results) No results for input(s): BNP in the last 8760 hours.  ProBNP (last 3 results) No results for input(s): PROBNP in the last 8760 hours.  Radiological Exams: No results found.  Assessment/Plan Active Problems:   Acute on chronic respiratory failure with hypoxia (HCC)   COVID-19 virus infection   Atrial fibrillation with RVR (HCC)   Severe sepsis (HCC)   Pneumonia due to COVID-19 virus   1. Acute on chronic respiratory failure with hypoxia we will continue with NAG titrate oxygen continue pulmonary toilet 2. COVID-19 virus infection now is in resolution phase 3. Atrial fibrillation with RVR 4. Severe sepsis continue to monitor 5. Pneumonia due to COVID-19 treated we will continue to follow along   I have personally seen and evaluated the patient, evaluated laboratory and imaging results, formulated the assessment and plan and placed orders. The Patient requires high complexity decision making with multiple systems involvement.  Rounds were done with the Respiratory Therapy Director and Staff therapists and discussed with nursing staff also.  Allyne Gee, MD [facility]  Pulmonary Critical Care Medicine Sleep Medicine

## 2019-12-03 DIAGNOSIS — A419 Sepsis, unspecified organism: Secondary | ICD-10-CM | POA: Diagnosis not present

## 2019-12-03 DIAGNOSIS — I4891 Unspecified atrial fibrillation: Secondary | ICD-10-CM | POA: Diagnosis not present

## 2019-12-03 DIAGNOSIS — U071 COVID-19: Secondary | ICD-10-CM | POA: Diagnosis not present

## 2019-12-03 DIAGNOSIS — J9621 Acute and chronic respiratory failure with hypoxia: Secondary | ICD-10-CM | POA: Diagnosis not present

## 2019-12-03 LAB — CBC
HCT: 24.5 % — ABNORMAL LOW (ref 39.0–52.0)
Hemoglobin: 7.6 g/dL — ABNORMAL LOW (ref 13.0–17.0)
MCH: 27 pg (ref 26.0–34.0)
MCHC: 31 g/dL (ref 30.0–36.0)
MCV: 86.9 fL (ref 80.0–100.0)
Platelets: 240 10*3/uL (ref 150–400)
RBC: 2.82 MIL/uL — ABNORMAL LOW (ref 4.22–5.81)
RDW: 22.2 % — ABNORMAL HIGH (ref 11.5–15.5)
WBC: 11.3 10*3/uL — ABNORMAL HIGH (ref 4.0–10.5)
nRBC: 0.4 % — ABNORMAL HIGH (ref 0.0–0.2)

## 2019-12-03 LAB — RENAL FUNCTION PANEL
Albumin: 1.3 g/dL — ABNORMAL LOW (ref 3.5–5.0)
Anion gap: 7 (ref 5–15)
BUN: 46 mg/dL — ABNORMAL HIGH (ref 8–23)
CO2: 31 mmol/L (ref 22–32)
Calcium: 8 mg/dL — ABNORMAL LOW (ref 8.9–10.3)
Chloride: 107 mmol/L (ref 98–111)
Creatinine, Ser: 0.87 mg/dL (ref 0.61–1.24)
GFR calc Af Amer: >60 mL/min (ref >60)
GFR calc non Af Amer: >60 mL/min (ref >60)
Glucose, Bld: 132 mg/dL — ABNORMAL HIGH (ref 70–99)
Phosphorus: 2.6 mg/dL (ref 2.5–4.6)
Potassium: 3.7 mmol/L (ref 3.5–5.1)
Sodium: 145 mmol/L (ref 135–145)

## 2019-12-03 LAB — MAGNESIUM: Magnesium: 1.8 mg/dL (ref 1.7–2.4)

## 2019-12-03 LAB — LACTIC ACID, PLASMA: Lactic Acid, Venous: 0.9 mmol/L (ref 0.5–1.9)

## 2019-12-03 MED ORDER — GENERIC EXTERNAL MEDICATION
Status: DC
Start: ? — End: 2019-12-03

## 2019-12-03 NOTE — Progress Notes (Addendum)
Pulmonary Critical Care Medicine [place]   PULMONARY CRITICAL CARE SERVICE  PROGRESS NOTE  Date of Service: 12/03/2019  Sandip Power  [id number]  DOB: 1945-11-12   DOA: 11/21/2019  Referring Physician: [name], MD  HPI: Shane Banks is a 74 y.o. male seen for follow up of Acute on Chronic Respiratory Failure.  Patient remains on NAG 2 L for 16-hour goal today satting well no fever or distress  Medications: Reviewed on Rounds  Physical Exam:  Vitals: Pulse 69 respirations 23 BP 137/71 O2 sat 98% temp 98.1  Ventilator Settings NAG 2 L  . General: Comfortable at this time . Eyes: Grossly normal lids, irises & conjunctiva . ENT: grossly tongue is normal . Neck: no obvious mass . Cardiovascular: S1 S2 normal no gallop . Respiratory: No rales or rhonchi noted . Abdomen: soft . Skin: no rash seen on limited exam . Musculoskeletal: not rigid . Psychiatric:unable to assess . Neurologic: no seizure no involuntary movements         Lab Data:   Basic Metabolic Panel: Recent Labs  Lab 11/28/19 0601 11/28/19 0601 11/29/19 0940 11/30/19 0656 12/01/19 0436 12/02/19 0606 12/03/19 0352  NA 147*   < > 148* 148* 145 146* 145  K 4.8   < > 4.4 4.0 3.6 3.4* 3.7  CL 118*   < > 114* 115* 111 112* 107  CO2 25   < > 26 26 27 26 31   GLUCOSE 153*   < > 145* 168* 179* 147* 132*  BUN 65*   < > 69* 67* 54* 51* 46*  CREATININE 0.96   < > 0.90 0.86 0.80 0.91 0.87  CALCIUM 8.0*   < > 8.3* 8.2* 8.0* 8.1* 8.0*  MG 1.9   < > 1.7 1.6* 1.5* 1.7 1.8  PHOS 2.2*  --   --  2.1*  --   --  2.6   < > = values in this interval not displayed.    ABG: No results for input(s): PHART, PCO2ART, PO2ART, HCO3, O2SAT in the last 168 hours.  Liver Function Tests: Recent Labs  Lab 11/30/19 0656 12/03/19 0352  ALBUMIN 1.3* 1.3*   No results for input(s): LIPASE, AMYLASE in the last 168 hours. No results for input(s): AMMONIA in the last 168 hours.  CBC: Recent Labs  Lab  11/28/19 0601 11/29/19 0940 11/30/19 0656 12/01/19 0436 12/03/19 0352  WBC 14.5* 9.6 11.1* 10.5 11.3*  HGB 7.2* 7.3* 7.0* 8.0* 7.6*  HCT 23.1* 23.2* 22.4* 24.8* 24.5*  MCV 85.6 86.2 85.5 83.5 86.9  PLT 181 PLATELET CLUMPS NOTED ON SMEAR, UNABLE TO ESTIMATE 210 216 240    Cardiac Enzymes: No results for input(s): CKTOTAL, CKMB, CKMBINDEX, TROPONINI in the last 168 hours.  BNP (last 3 results) No results for input(s): BNP in the last 8760 hours.  ProBNP (last 3 results) No results for input(s): PROBNP in the last 8760 hours.  Radiological Exams: No results found.  Assessment/Plan Active Problems:   Acute on chronic respiratory failure with hypoxia (HCC)   COVID-19 virus infection   Atrial fibrillation with RVR (HCC)   Severe sepsis (HCC)   Pneumonia due to COVID-19 virus   1. Acute on chronic respiratory failure with hypoxia we will continue with NAG titrate oxygen continue pulmonary toilet 2. COVID-19 virus infection now is in resolution phase 3. Atrial fibrillation with RVR 4. Severe sepsis continue to monitor 5. Pneumonia due to COVID-19 treated we will continue to follow along  I have personally seen and evaluated the patient, evaluated laboratory and imaging results, formulated the assessment and plan and placed orders. The Patient requires high complexity decision making with multiple systems involvement.  Rounds were done with the Respiratory Therapy Director and Staff therapists and discussed with nursing staff also.  Allyne Gee, MD [facility] Pulmonary Critical Care Medicine Sleep Medicine

## 2019-12-04 DIAGNOSIS — I4891 Unspecified atrial fibrillation: Secondary | ICD-10-CM | POA: Diagnosis not present

## 2019-12-04 DIAGNOSIS — J9621 Acute and chronic respiratory failure with hypoxia: Secondary | ICD-10-CM | POA: Diagnosis not present

## 2019-12-04 DIAGNOSIS — A419 Sepsis, unspecified organism: Secondary | ICD-10-CM | POA: Diagnosis not present

## 2019-12-04 DIAGNOSIS — U071 COVID-19: Secondary | ICD-10-CM | POA: Diagnosis not present

## 2019-12-04 LAB — CBC
HCT: 25 % — ABNORMAL LOW (ref 39.0–52.0)
Hemoglobin: 7.7 g/dL — ABNORMAL LOW (ref 13.0–17.0)
MCH: 27.3 pg (ref 26.0–34.0)
MCHC: 30.8 g/dL (ref 30.0–36.0)
MCV: 88.7 fL (ref 80.0–100.0)
Platelets: 210 10*3/uL (ref 150–400)
RBC: 2.82 MIL/uL — ABNORMAL LOW (ref 4.22–5.81)
RDW: 22.2 % — ABNORMAL HIGH (ref 11.5–15.5)
WBC: 12.3 10*3/uL — ABNORMAL HIGH (ref 4.0–10.5)
nRBC: 0.2 % (ref 0.0–0.2)

## 2019-12-04 LAB — URINALYSIS, ROUTINE W REFLEX MICROSCOPIC
Bilirubin Urine: NEGATIVE
Glucose, UA: NEGATIVE mg/dL
Ketones, ur: NEGATIVE mg/dL
Nitrite: POSITIVE — AB
Protein, ur: 30 mg/dL — AB
Specific Gravity, Urine: 1.012 (ref 1.005–1.030)
WBC, UA: >50 WBC/hpf — ABNORMAL HIGH (ref 0–5)
pH: 7 (ref 5.0–8.0)

## 2019-12-04 LAB — MAGNESIUM: Magnesium: 1.8 mg/dL (ref 1.7–2.4)

## 2019-12-04 LAB — BASIC METABOLIC PANEL
Anion gap: 9 (ref 5–15)
BUN: 37 mg/dL — ABNORMAL HIGH (ref 8–23)
CO2: 29 mmol/L (ref 22–32)
Calcium: 8 mg/dL — ABNORMAL LOW (ref 8.9–10.3)
Chloride: 108 mmol/L (ref 98–111)
Creatinine, Ser: 0.95 mg/dL (ref 0.61–1.24)
GFR calc Af Amer: >60 mL/min (ref >60)
GFR calc non Af Amer: >60 mL/min (ref >60)
Glucose, Bld: 104 mg/dL — ABNORMAL HIGH (ref 70–99)
Potassium: 3.8 mmol/L (ref 3.5–5.1)
Sodium: 146 mmol/L — ABNORMAL HIGH (ref 135–145)

## 2019-12-04 LAB — PHOSPHORUS: Phosphorus: 2.9 mg/dL (ref 2.5–4.6)

## 2019-12-04 NOTE — Progress Notes (Signed)
Pulmonary Critical Care Medicine [place]   PULMONARY CRITICAL CARE SERVICE  PROGRESS NOTE  Date of Service: 12/04/2019  Shane Banks  JKK:[id number]  DOB: 07-11-1946   DOA: 11/21/2019  Referring Physician: [name], MD  HPI: Shane Banks is a 74 y.o. male seen for follow up of Acute on Chronic Respiratory Failure.  Patient is on the NAG with 2 L oxygen good saturations are noted  Medications: Reviewed on Rounds  Physical Exam:  Vitals: Temperature is 98.4 pulse 72 respiratory 25 blood pressure is 160/83 saturations 96%  Ventilator Settings on NAG on 2 L  . General: Comfortable at this time . Eyes: Grossly normal lids, irises & conjunctiva . ENT: grossly tongue is normal . Neck: no obvious mass . Cardiovascular: S1 S2 normal no gallop . Respiratory: No rhonchi coarse breath sounds are noted . Abdomen: soft . Skin: no rash seen on limited exam . Musculoskeletal: not rigid . Psychiatric:unable to assess . Neurologic: no seizure no involuntary movements         Lab Data:   Basic Metabolic Panel: Recent Labs  Lab 11/28/19 0601 11/29/19 0940 11/30/19 0656 12/01/19 0436 12/02/19 0606 12/03/19 0352 12/04/19 0530  NA 147*   < > 148* 145 146* 145 146*  K 4.8   < > 4.0 3.6 3.4* 3.7 3.8  CL 118*   < > 115* 111 112* 107 108  CO2 25   < > 26 27 26 31 29   GLUCOSE 153*   < > 168* 179* 147* 132* 104*  BUN 65*   < > 67* 54* 51* 46* 37*  CREATININE 0.96   < > 0.86 0.80 0.91 0.87 0.95  CALCIUM 8.0*   < > 8.2* 8.0* 8.1* 8.0* 8.0*  MG 1.9   < > 1.6* 1.5* 1.7 1.8 1.8  PHOS 2.2*  --  2.1*  --   --  2.6 2.9   < > = values in this interval not displayed.    ABG: No results for input(s): PHART, PCO2ART, PO2ART, HCO3, O2SAT in the last 168 hours.  Liver Function Tests: Recent Labs  Lab 11/30/19 0656 12/03/19 0352  ALBUMIN 1.3* 1.3*   No results for input(s): LIPASE, AMYLASE in the last 168 hours. No results for input(s): AMMONIA in the last 168  hours.  CBC: Recent Labs  Lab 11/29/19 0940 11/30/19 0656 12/01/19 0436 12/03/19 0352 12/04/19 0530  WBC 9.6 11.1* 10.5 11.3* 12.3*  HGB 7.3* 7.0* 8.0* 7.6* 7.7*  HCT 23.2* 22.4* 24.8* 24.5* 25.0*  MCV 86.2 85.5 83.5 86.9 88.7  PLT PLATELET CLUMPS NOTED ON SMEAR, UNABLE TO ESTIMATE 210 216 240 210    Cardiac Enzymes: No results for input(s): CKTOTAL, CKMB, CKMBINDEX, TROPONINI in the last 168 hours.  BNP (last 3 results) No results for input(s): BNP in the last 8760 hours.  ProBNP (last 3 results) No results for input(s): PROBNP in the last 8760 hours.  Radiological Exams: No results found.  Assessment/Plan Active Problems:   Acute on chronic respiratory failure with hypoxia (HCC)   COVID-19 virus infection   Atrial fibrillation with RVR (HCC)   Severe sepsis (HCC)   Pneumonia due to COVID-19 virus   1. Acute on chronic respiratory failure with hypoxia goal of 16 hours off the ventilator on the NAG 2. COVID-19 virus infection recovery phase 3. Atrial fibrillation rate now rate controlled 4. Severe sepsis hemodynamics are stable 5. Pneumonia due to COVID-19 treated we will continue to follow   I  have personally seen and evaluated the patient, evaluated laboratory and imaging results, formulated the assessment and plan and placed orders. The Patient requires high complexity decision making with multiple systems involvement.  Rounds were done with the Respiratory Therapy Director and Staff therapists and discussed with nursing staff also.  Allyne Gee, MD [facility] Pulmonary Critical Care Medicine Sleep Medicine

## 2019-12-05 ENCOUNTER — Other Ambulatory Visit (HOSPITAL_COMMUNITY): Payer: Medicare HMO

## 2019-12-05 DIAGNOSIS — J9621 Acute and chronic respiratory failure with hypoxia: Secondary | ICD-10-CM | POA: Diagnosis not present

## 2019-12-05 DIAGNOSIS — U071 COVID-19: Secondary | ICD-10-CM | POA: Diagnosis not present

## 2019-12-05 DIAGNOSIS — A419 Sepsis, unspecified organism: Secondary | ICD-10-CM | POA: Diagnosis not present

## 2019-12-05 DIAGNOSIS — I4891 Unspecified atrial fibrillation: Secondary | ICD-10-CM | POA: Diagnosis not present

## 2019-12-05 NOTE — Progress Notes (Signed)
Pulmonary Critical Care Medicine [name]   PULMONARY CRITICAL CARE SERVICE  PROGRESS NOTE  Date of Service: 12/05/2019  Shane Banks  GYF:[id number]  DOB: [birth year]   DOA: 11/21/2019  Referring Physician: [name], MD  HPI: Shane Banks is a 74 y.o. male seen for follow up of Acute on Chronic Respiratory Failure.  Patient at this time is on the NAG has been on 2 L of O2 doing fairly well  Medications: Reviewed on Rounds  Physical Exam:  Vitals: Temperature is 98.8 pulse 68 respiratory rate 30 blood pressure is 156/83 saturations 99%  Ventilator Settings off the ventilator on the NAG 2 L O2  . General: Comfortable at this time . Eyes: Grossly normal lids, irises & conjunctiva . ENT: grossly tongue is normal . Neck: no obvious mass . Cardiovascular: S1 S2 normal no gallop . Respiratory: Scattered rhonchi expansion is equal at this time . Abdomen: soft . Skin: no rash seen on limited exam . Musculoskeletal: not rigid . Psychiatric:unable to assess . Neurologic: no seizure no involuntary movements         Lab Data:   Basic Metabolic Panel: Recent Labs  Lab 11/30/19 0656 12/01/19 0436 12/02/19 0606 12/03/19 0352 12/04/19 0530  NA 148* 145 146* 145 146*  K 4.0 3.6 3.4* 3.7 3.8  CL 115* 111 112* 107 108  CO2 26 27 26 31 29   GLUCOSE 168* 179* 147* 132* 104*  BUN 67* 54* 51* 46* 37*  CREATININE 0.86 0.80 0.91 0.87 0.95  CALCIUM 8.2* 8.0* 8.1* 8.0* 8.0*  MG 1.6* 1.5* 1.7 1.8 1.8  PHOS 2.1*  --   --  2.6 2.9    ABG: No results for input(s): PHART, PCO2ART, PO2ART, HCO3, O2SAT in the last 168 hours.  Liver Function Tests: Recent Labs  Lab 11/30/19 0656 12/03/19 0352  ALBUMIN 1.3* 1.3*   No results for input(s): LIPASE, AMYLASE in the last 168 hours. No results for input(s): AMMONIA in the last 168 hours.  CBC: Recent Labs  Lab 11/29/19 0940 11/30/19 0656 12/01/19 0436 12/03/19 0352 12/04/19 0530  WBC 9.6 11.1* 10.5 11.3*  12.3*  HGB 7.3* 7.0* 8.0* 7.6* 7.7*  HCT 23.2* 22.4* 24.8* 24.5* 25.0*  MCV 86.2 85.5 83.5 86.9 88.7  PLT PLATELET CLUMPS NOTED ON SMEAR, UNABLE TO ESTIMATE 210 216 240 210    Cardiac Enzymes: No results for input(s): CKTOTAL, CKMB, CKMBINDEX, TROPONINI in the last 168 hours.  BNP (last 3 results) No results for input(s): BNP in the last 8760 hours.  ProBNP (last 3 results) No results for input(s): PROBNP in the last 8760 hours.  Radiological Exams: DG CHEST PORT 1 VIEW  Result Date: 12/05/2019 CLINICAL DATA:  Pneumonia EXAM: PORTABLE CHEST 1 VIEW COMPARISON:  11/29/2019 FINDINGS: The tracheostomy tube is in good position without complicating features. Left-sided chest tube is noted. No pneumothorax. Persistent bilateral infiltrates and bilateral effusions. IMPRESSION: 1. Tracheostomy tube in good position. 2. Persistent bilateral infiltrates and effusions. Electronically Signed   By: Marijo Sanes M.D.   On: 12/05/2019 07:20    Assessment/Plan Active Problems:   Acute on chronic respiratory failure with hypoxia (HCC)   COVID-19 virus infection   Atrial fibrillation with RVR (HCC)   Severe sepsis (HCC)   Pneumonia due to COVID-19 virus   1. Acute on chronic respiratory failure with hypoxia plan is going to be to continue with NAG device titrate oxygen as tolerated continue pulmonary toilet supportive care 2. COVID-19 virus infection treated resolution  phase 3. Atrial fibrillation rate is controlled 4. Severe sepsis hemodynamics are stable 5. Pneumonia due to COVID-19 treated we will continue to monitor   I have personally seen and evaluated the patient, evaluated laboratory and imaging results, formulated the assessment and plan and placed orders. The Patient requires high complexity decision making with multiple systems involvement.  Rounds were done with the Respiratory Therapy Director and Staff therapists and discussed with nursing staff also.  Allyne Gee, MD  [facility] Pulmonary Critical Care Medicine Sleep Medicine

## 2019-12-06 DIAGNOSIS — A419 Sepsis, unspecified organism: Secondary | ICD-10-CM | POA: Diagnosis not present

## 2019-12-06 DIAGNOSIS — I4891 Unspecified atrial fibrillation: Secondary | ICD-10-CM | POA: Diagnosis not present

## 2019-12-06 DIAGNOSIS — J9621 Acute and chronic respiratory failure with hypoxia: Secondary | ICD-10-CM | POA: Diagnosis not present

## 2019-12-06 DIAGNOSIS — U071 COVID-19: Secondary | ICD-10-CM | POA: Diagnosis not present

## 2019-12-06 LAB — BASIC METABOLIC PANEL
Anion gap: 6 (ref 5–15)
BUN: 30 mg/dL — ABNORMAL HIGH (ref 8–23)
CO2: 31 mmol/L (ref 22–32)
Calcium: 7.7 mg/dL — ABNORMAL LOW (ref 8.9–10.3)
Chloride: 105 mmol/L (ref 98–111)
Creatinine, Ser: 0.83 mg/dL (ref 0.61–1.24)
GFR calc Af Amer: >60 mL/min (ref >60)
GFR calc non Af Amer: >60 mL/min (ref >60)
Glucose, Bld: 141 mg/dL — ABNORMAL HIGH (ref 70–99)
Potassium: 3.6 mmol/L (ref 3.5–5.1)
Sodium: 142 mmol/L (ref 135–145)

## 2019-12-06 LAB — URINE CULTURE: Culture: >=100000 — AB

## 2019-12-06 LAB — MAGNESIUM: Magnesium: 1.6 mg/dL — ABNORMAL LOW (ref 1.7–2.4)

## 2019-12-06 NOTE — Progress Notes (Addendum)
Pulmonary Critical Care Medicine [place]   PULMONARY CRITICAL CARE SERVICE  PROGRESS NOTE  Date of Service: 12/06/2019  Shane Banks  QAS:[id number]  DOB: May 10, 1946   DOA: 11/21/2019  Referring Physician: [name], MD  HPI: Shane Banks is a 74 y.o. male seen for follow up of Acute on Chronic Respiratory Failure.  Patient is a 24-hour goal NAG 2 L at this time satting well no fever distress.  Medications: Reviewed on Rounds  Physical Exam:  Vitals: Pulse 69 respirations 35 BP 147/82 O2 sat 97% temp 97.2  Ventilator Settings NAG 2 L  . General: Comfortable at this time . Eyes: Grossly normal lids, irises & conjunctiva . ENT: grossly tongue is normal . Neck: no obvious mass . Cardiovascular: S1 S2 normal no gallop . Respiratory: No rales or rhonchi noted . Abdomen: soft . Skin: no rash seen on limited exam . Musculoskeletal: not rigid . Psychiatric:unable to assess . Neurologic: no seizure no involuntary movements         Lab Data:   Basic Metabolic Panel: Recent Labs  Lab 11/30/19 0656 11/30/19 0656 12/01/19 0436 12/02/19 0606 12/03/19 0352 12/04/19 0530 12/06/19 0543  NA 148*   < > 145 146* 145 146* 142  K 4.0   < > 3.6 3.4* 3.7 3.8 3.6  CL 115*   < > 111 112* 107 108 105  CO2 26   < > 27 26 31 29 31   GLUCOSE 168*   < > 179* 147* 132* 104* 141*  BUN 67*   < > 54* 51* 46* 37* 30*  CREATININE 0.86   < > 0.80 0.91 0.87 0.95 0.83  CALCIUM 8.2*   < > 8.0* 8.1* 8.0* 8.0* 7.7*  MG 1.6*   < > 1.5* 1.7 1.8 1.8 1.6*  PHOS 2.1*  --   --   --  2.6 2.9  --    < > = values in this interval not displayed.    ABG: No results for input(s): PHART, PCO2ART, PO2ART, HCO3, O2SAT in the last 168 hours.  Liver Function Tests: Recent Labs  Lab 11/30/19 0656 12/03/19 0352  ALBUMIN 1.3* 1.3*   No results for input(s): LIPASE, AMYLASE in the last 168 hours. No results for input(s): AMMONIA in the last 168 hours.  CBC: Recent Labs  Lab  11/30/19 0656 12/01/19 0436 12/03/19 0352 12/04/19 0530  WBC 11.1* 10.5 11.3* 12.3*  HGB 7.0* 8.0* 7.6* 7.7*  HCT 22.4* 24.8* 24.5* 25.0*  MCV 85.5 83.5 86.9 88.7  PLT 210 216 240 210    Cardiac Enzymes: No results for input(s): CKTOTAL, CKMB, CKMBINDEX, TROPONINI in the last 168 hours.  BNP (last 3 results) No results for input(s): BNP in the last 8760 hours.  ProBNP (last 3 results) No results for input(s): PROBNP in the last 8760 hours.  Radiological Exams: DG CHEST PORT 1 VIEW  Result Date: 12/05/2019 CLINICAL DATA:  Pneumonia EXAM: PORTABLE CHEST 1 VIEW COMPARISON:  11/29/2019 FINDINGS: The tracheostomy tube is in good position without complicating features. Left-sided chest tube is noted. No pneumothorax. Persistent bilateral infiltrates and bilateral effusions. IMPRESSION: 1. Tracheostomy tube in good position. 2. Persistent bilateral infiltrates and effusions. Electronically Signed   By: Marijo Sanes M.D.   On: 12/05/2019 07:20    Assessment/Plan Active Problems:   Acute on chronic respiratory failure with hypoxia (HCC)   COVID-19 virus infection   Atrial fibrillation with RVR (HCC)   Severe sepsis (HCC)   Pneumonia due  to COVID-19 virus   1. Acute on chronic respiratory failure with hypoxia patient is working on 24-hour goal today on NAG 2 L we will continue to attempt weaning per protocol.  Satting well no fever-continue supportive measures and pulmonary toilet. 2. COVID-19 virus infection treated resolution phase 3. Atrial fibrillation rate is controlled 4. Severe sepsis hemodynamics are stable 5. Pneumonia due to COVID-19 treated we will continue to monitor   I have personally seen and evaluated the patient, evaluated laboratory and imaging results, formulated the assessment and plan and placed orders. The Patient requires high complexity decision making with multiple systems involvement.  Rounds were done with the Respiratory Therapy Director and Staff  therapists and discussed with nursing staff also.  Allyne Gee, MD [facility] Pulmonary Critical Care Medicine Sleep Medicine

## 2019-12-07 DIAGNOSIS — U071 COVID-19: Secondary | ICD-10-CM | POA: Diagnosis not present

## 2019-12-07 DIAGNOSIS — J9621 Acute and chronic respiratory failure with hypoxia: Secondary | ICD-10-CM | POA: Diagnosis not present

## 2019-12-07 DIAGNOSIS — A419 Sepsis, unspecified organism: Secondary | ICD-10-CM | POA: Diagnosis not present

## 2019-12-07 DIAGNOSIS — I4891 Unspecified atrial fibrillation: Secondary | ICD-10-CM | POA: Diagnosis not present

## 2019-12-07 LAB — CBC
HCT: 22.1 % — ABNORMAL LOW (ref 39.0–52.0)
Hemoglobin: 6.8 g/dL — CL (ref 13.0–17.0)
MCH: 26.8 pg (ref 26.0–34.0)
MCHC: 30.8 g/dL (ref 30.0–36.0)
MCV: 87 fL (ref 80.0–100.0)
Platelets: 146 10*3/uL — ABNORMAL LOW (ref 150–400)
RBC: 2.54 MIL/uL — ABNORMAL LOW (ref 4.22–5.81)
RDW: 21.5 % — ABNORMAL HIGH (ref 11.5–15.5)
WBC: 14.3 10*3/uL — ABNORMAL HIGH (ref 4.0–10.5)
nRBC: 0 % (ref 0.0–0.2)

## 2019-12-07 LAB — PREPARE RBC (CROSSMATCH)

## 2019-12-07 LAB — MAGNESIUM: Magnesium: 1.9 mg/dL (ref 1.7–2.4)

## 2019-12-07 MED ORDER — GENERIC EXTERNAL MEDICATION
Status: DC
Start: ? — End: 2019-12-07

## 2019-12-07 NOTE — Progress Notes (Signed)
Pulmonary Critical Care Medicine [place]   PULMONARY CRITICAL CARE SERVICE  PROGRESS NOTE  Date of Service: 12/07/2019  Shane Banks  SLH:[id number]  DOB: 1946/02/11   DOA: 11/21/2019  Referring Physician: [name], MD  HPI: Shane Banks is a 74 y.o. male seen for follow up of Acute on Chronic Respiratory Failure.  Patient is currently on the NAG is on 2 L with a goal of 24 hours today  Medications: Reviewed on Rounds  Physical Exam:  Vitals: Temperature is 98.1 pulse 65 respiratory rate 22 blood pressure is 103/58 saturations 99%  Ventilator Settings on the NAG on 2 L  . General: Comfortable at this time . Eyes: Grossly normal lids, irises & conjunctiva . ENT: grossly tongue is normal . Neck: no obvious mass . Cardiovascular: S1 S2 normal no gallop . Respiratory: No rhonchi no rales are noted at this time . Abdomen: soft . Skin: no rash seen on limited exam . Musculoskeletal: not rigid . Psychiatric:unable to assess . Neurologic: no seizure no involuntary movements         Lab Data:   Basic Metabolic Panel: Recent Labs  Lab 12/01/19 0436 12/01/19 0436 12/02/19 0606 12/03/19 0352 12/04/19 0530 12/06/19 0543 12/07/19 0645  NA 145  --  146* 145 146* 142  --   K 3.6  --  3.4* 3.7 3.8 3.6  --   CL 111  --  112* 107 108 105  --   CO2 27  --  26 31 29 31   --   GLUCOSE 179*  --  147* 132* 104* 141*  --   BUN 54*  --  51* 46* 37* 30*  --   CREATININE 0.80  --  0.91 0.87 0.95 0.83  --   CALCIUM 8.0*  --  8.1* 8.0* 8.0* 7.7*  --   MG 1.5*   < > 1.7 1.8 1.8 1.6* 1.9  PHOS  --   --   --  2.6 2.9  --   --    < > = values in this interval not displayed.    ABG: No results for input(s): PHART, PCO2ART, PO2ART, HCO3, O2SAT in the last 168 hours.  Liver Function Tests: Recent Labs  Lab 12/03/19 0352  ALBUMIN 1.3*   No results for input(s): LIPASE, AMYLASE in the last 168 hours. No results for input(s): AMMONIA in the last 168  hours.  CBC: Recent Labs  Lab 12/01/19 0436 12/03/19 0352 12/04/19 0530 12/07/19 0645  WBC 10.5 11.3* 12.3* 14.3*  HGB 8.0* 7.6* 7.7* 6.8*  HCT 24.8* 24.5* 25.0* 22.1*  MCV 83.5 86.9 88.7 87.0  PLT 216 240 210 146*    Cardiac Enzymes: No results for input(s): CKTOTAL, CKMB, CKMBINDEX, TROPONINI in the last 168 hours.  BNP (last 3 results) No results for input(s): BNP in the last 8760 hours.  ProBNP (last 3 results) No results for input(s): PROBNP in the last 8760 hours.  Radiological Exams: No results found.  Assessment/Plan Active Problems:   Acute on chronic respiratory failure with hypoxia (HCC)   COVID-19 virus infection   Atrial fibrillation with RVR (HCC)   Severe sepsis (HCC)   Pneumonia due to COVID-19 virus   1. Acute on chronic respiratory failure with hypoxia we will continue with oxygen therapy and wean goal today is 24 hours 2. COVID-19 virus infection in resolution phase 3. Atrial fibrillation rate controlled 4. Severe sepsis hemodynamics are stable we will continue to monitor. 5. Pneumonia to COVID-19  treated we will continue to follow   I have personally seen and evaluated the patient, evaluated laboratory and imaging results, formulated the assessment and plan and placed orders. The Patient requires high complexity decision making with multiple systems involvement.  Rounds were done with the Respiratory Therapy Director and Staff therapists and discussed with nursing staff also.  Allyne Gee, MD [facility] Pulmonary Critical Care Medicine Sleep Medicine

## 2019-12-08 DIAGNOSIS — I4891 Unspecified atrial fibrillation: Secondary | ICD-10-CM | POA: Diagnosis not present

## 2019-12-08 DIAGNOSIS — J9621 Acute and chronic respiratory failure with hypoxia: Secondary | ICD-10-CM | POA: Diagnosis not present

## 2019-12-08 DIAGNOSIS — A419 Sepsis, unspecified organism: Secondary | ICD-10-CM | POA: Diagnosis not present

## 2019-12-08 DIAGNOSIS — U071 COVID-19: Secondary | ICD-10-CM | POA: Diagnosis not present

## 2019-12-08 LAB — BASIC METABOLIC PANEL
Anion gap: 11 (ref 5–15)
BUN: 41 mg/dL — ABNORMAL HIGH (ref 8–23)
CO2: 29 mmol/L (ref 22–32)
Calcium: 7.8 mg/dL — ABNORMAL LOW (ref 8.9–10.3)
Chloride: 102 mmol/L (ref 98–111)
Creatinine, Ser: 0.81 mg/dL (ref 0.61–1.24)
GFR calc Af Amer: >60 mL/min (ref >60)
GFR calc non Af Amer: >60 mL/min (ref >60)
Glucose, Bld: 123 mg/dL — ABNORMAL HIGH (ref 70–99)
Potassium: 3.4 mmol/L — ABNORMAL LOW (ref 3.5–5.1)
Sodium: 142 mmol/L (ref 135–145)

## 2019-12-08 LAB — CBC
HCT: 27.3 % — ABNORMAL LOW (ref 39.0–52.0)
Hemoglobin: 8.6 g/dL — ABNORMAL LOW (ref 13.0–17.0)
MCH: 27.2 pg (ref 26.0–34.0)
MCHC: 31.5 g/dL (ref 30.0–36.0)
MCV: 86.4 fL (ref 80.0–100.0)
Platelets: 162 10*3/uL (ref 150–400)
RBC: 3.16 MIL/uL — ABNORMAL LOW (ref 4.22–5.81)
RDW: 20.1 % — ABNORMAL HIGH (ref 11.5–15.5)
WBC: 17.8 10*3/uL — ABNORMAL HIGH (ref 4.0–10.5)
nRBC: 0 % (ref 0.0–0.2)

## 2019-12-08 LAB — BPAM RBC
Blood Product Expiration Date: 202104132359
ISSUE DATE / TIME: 202103111449
Unit Type and Rh: 5100

## 2019-12-08 LAB — TYPE AND SCREEN
ABO/RH(D): O POS
Antibody Screen: NEGATIVE
Unit division: 0

## 2019-12-08 NOTE — Progress Notes (Addendum)
Pulmonary Critical Care Medicine [place]   PULMONARY CRITICAL CARE SERVICE  PROGRESS NOTE  Date of Service: 12/08/2019  Joshiah Traynham  OVZ:[id number]  DOB: May 06, 1946   DOA: 11/21/2019  Referring Physician: [name], MD  HPI: Nilesh Stegall is a 74 y.o. male seen for follow up of Acute on Chronic Respiratory Failure.  Patient has made our goal today on NAG 2 L/min satting well at this time with no fever or distress.  Medications: Reviewed on Rounds  Physical Exam:  Vitals: Pulse 83 respirations 53 BP 143/75 O2 sat 93% temp 97.4  Ventilator Settings NAG 2 L  . General: Comfortable at this time . Eyes: Grossly normal lids, irises & conjunctiva . ENT: grossly tongue is normal . Neck: no obvious mass . Cardiovascular: S1 S2 normal no gallop . Respiratory: No rales or rhonchi noted . Abdomen: soft . Skin: no rash seen on limited exam . Musculoskeletal: not rigid . Psychiatric:unable to assess . Neurologic: no seizure no involuntary movements         Lab Data:   Basic Metabolic Panel: Recent Labs  Lab 12/02/19 0606 12/03/19 0352 12/04/19 0530 12/06/19 0543 12/07/19 0645 12/08/19 0622  NA 146* 145 146* 142  --  142  K 3.4* 3.7 3.8 3.6  --  3.4*  CL 112* 107 108 105  --  102  CO2 26 31 29 31   --  29  GLUCOSE 147* 132* 104* 141*  --  123*  BUN 51* 46* 37* 30*  --  41*  CREATININE 0.91 0.87 0.95 0.83  --  0.81  CALCIUM 8.1* 8.0* 8.0* 7.7*  --  7.8*  MG 1.7 1.8 1.8 1.6* 1.9  --   PHOS  --  2.6 2.9  --   --   --     ABG: No results for input(s): PHART, PCO2ART, PO2ART, HCO3, O2SAT in the last 168 hours.  Liver Function Tests: Recent Labs  Lab 12/03/19 0352  ALBUMIN 1.3*   No results for input(s): LIPASE, AMYLASE in the last 168 hours. No results for input(s): AMMONIA in the last 168 hours.  CBC: Recent Labs  Lab 12/03/19 0352 12/04/19 0530 12/07/19 0645 12/08/19 0622  WBC 11.3* 12.3* 14.3* 17.8*  HGB 7.6* 7.7* 6.8* 8.6*  HCT  24.5* 25.0* 22.1* 27.3*  MCV 86.9 88.7 87.0 86.4  PLT 240 210 146* 162    Cardiac Enzymes: No results for input(s): CKTOTAL, CKMB, CKMBINDEX, TROPONINI in the last 168 hours.  BNP (last 3 results) No results for input(s): BNP in the last 8760 hours.  ProBNP (last 3 results) No results for input(s): PROBNP in the last 8760 hours.  Radiological Exams: No results found.  Assessment/Plan Active Problems:   Acute on chronic respiratory failure with hypoxia (HCC)   COVID-19 virus infection   Atrial fibrillation with RVR (HCC)   Severe sepsis (HCC)   Pneumonia due to COVID-19 virus   1. Acute on chronic respiratory failure with hypoxia we will continue with oxygen therapy and wean goal today is 48 hours 2. COVID-19 virus infection in resolution phase 3. Atrial fibrillation rate controlled 4. Severe sepsis hemodynamics are stable we will continue to monitor. 5. Pneumonia to COVID-19 treated we will continue to follow   I have personally seen and evaluated the patient, evaluated laboratory and imaging results, formulated the assessment and plan and placed orders. The Patient requires high complexity decision making with multiple systems involvement.  Rounds were done with the Respiratory Therapy Director  and Staff therapists and discussed with nursing staff also.  Allyne Gee, MD [facility] Pulmonary Critical Care Medicine Sleep Medicine

## 2019-12-09 ENCOUNTER — Other Ambulatory Visit (HOSPITAL_COMMUNITY): Payer: Medicare HMO

## 2019-12-09 DIAGNOSIS — J9621 Acute and chronic respiratory failure with hypoxia: Secondary | ICD-10-CM | POA: Diagnosis not present

## 2019-12-09 DIAGNOSIS — I4891 Unspecified atrial fibrillation: Secondary | ICD-10-CM | POA: Diagnosis not present

## 2019-12-09 DIAGNOSIS — A419 Sepsis, unspecified organism: Secondary | ICD-10-CM | POA: Diagnosis not present

## 2019-12-09 DIAGNOSIS — U071 COVID-19: Secondary | ICD-10-CM | POA: Diagnosis not present

## 2019-12-09 LAB — POTASSIUM: Potassium: 3.6 mmol/L (ref 3.5–5.1)

## 2019-12-09 LAB — CULTURE, RESPIRATORY W GRAM STAIN

## 2019-12-09 NOTE — Progress Notes (Signed)
Pulmonary Critical Care Medicine [place]   PULMONARY CRITICAL CARE SERVICE  PROGRESS NOTE  Date of Service: 12/09/2019  Shane Banks  JJO:[id number]  DOB: [birth year]   DOA: 11/21/2019  Referring Physician: [name], MD  HPI: Shane Banks is a 74 y.o. male seen for follow up of Acute on Chronic Respiratory Failure.  Patient at this time is on the NAG has been on 5 L of oxygen good saturations are noted  Medications: Reviewed on Rounds  Physical Exam:  Vitals: Temperature is 98.3 pulse 74 respiratory 36 blood pressure is 156/73 saturations 98%  Ventilator Settings off the ventilator on the NAG  . General: Comfortable at this time . Eyes: Grossly normal lids, irises & conjunctiva . ENT: grossly tongue is normal . Neck: no obvious mass . Cardiovascular: S1 S2 normal no gallop . Respiratory: No rhonchi no rales are noted at this time . Abdomen: soft . Skin: no rash seen on limited exam . Musculoskeletal: not rigid . Psychiatric:unable to assess . Neurologic: no seizure no involuntary movements         Lab Data:   Basic Metabolic Panel: Recent Labs  Lab 12/03/19 0352 12/04/19 0530 12/06/19 0543 12/07/19 0645 12/08/19 0622 12/09/19 0442  NA 145 146* 142  --  142  --   K 3.7 3.8 3.6  --  3.4* 3.6  CL 107 108 105  --  102  --   CO2 31 29 31   --  29  --   GLUCOSE 132* 104* 141*  --  123*  --   BUN 46* 37* 30*  --  41*  --   CREATININE 0.87 0.95 0.83  --  0.81  --   CALCIUM 8.0* 8.0* 7.7*  --  7.8*  --   MG 1.8 1.8 1.6* 1.9  --   --   PHOS 2.6 2.9  --   --   --   --     ABG: No results for input(s): PHART, PCO2ART, PO2ART, HCO3, O2SAT in the last 168 hours.  Liver Function Tests: Recent Labs  Lab 12/03/19 0352  ALBUMIN 1.3*   No results for input(s): LIPASE, AMYLASE in the last 168 hours. No results for input(s): AMMONIA in the last 168 hours.  CBC: Recent Labs  Lab 12/03/19 0352 12/04/19 0530 12/07/19 0645 12/08/19 0622  WBC  11.3* 12.3* 14.3* 17.8*  HGB 7.6* 7.7* 6.8* 8.6*  HCT 24.5* 25.0* 22.1* 27.3*  MCV 86.9 88.7 87.0 86.4  PLT 240 210 146* 162    Cardiac Enzymes: No results for input(s): CKTOTAL, CKMB, CKMBINDEX, TROPONINI in the last 168 hours.  BNP (last 3 results) No results for input(s): BNP in the last 8760 hours.  ProBNP (last 3 results) No results for input(s): PROBNP in the last 8760 hours.  Radiological Exams: DG CHEST PORT 1 VIEW  Result Date: 12/09/2019 CLINICAL DATA:  Status update EXAM: PORTABLE CHEST 1 VIEW COMPARISON:  Four days ago FINDINGS: Cardiomegaly. Tracheostomy tube in place. Pulmonary opacity on both sides with mild improvement in left base aeration. There is likely pleural fluid. No pneumothorax. IMPRESSION: Bilateral pneumonia with mildly improved aeration on the left. Electronically Signed   By: Monte Fantasia M.D.   On: 12/09/2019 07:28    Assessment/Plan Active Problems:   Acute on chronic respiratory failure with hypoxia (HCC)   COVID-19 virus infection   Atrial fibrillation with RVR (HCC)   Severe sepsis (HCC)   Pneumonia due to COVID-19 virus   1.  Acute on chronic respiratory failure hypoxia plan is to continue with the weaning process right now is requiring 5 L oxygen 2. COVID-19 virus infection resolution 3. Atrial fibrillation rate is controlled at this time 4. Severe sepsis hemodynamics are stable 5. Pneumonia due to COVID-19 treated clinically is improving with some mild improvement on the left side noted by chest x-ray   I have personally seen and evaluated the patient, evaluated laboratory and imaging results, formulated the assessment and plan and placed orders. The Patient requires high complexity decision making with multiple systems involvement.  Rounds were done with the Respiratory Therapy Director and Staff therapists and discussed with nursing staff also.  Allyne Gee, MD [facility] Pulmonary Critical Care Medicine Sleep Medicine

## 2019-12-10 DIAGNOSIS — I4891 Unspecified atrial fibrillation: Secondary | ICD-10-CM | POA: Diagnosis not present

## 2019-12-10 DIAGNOSIS — U071 COVID-19: Secondary | ICD-10-CM | POA: Diagnosis not present

## 2019-12-10 DIAGNOSIS — A419 Sepsis, unspecified organism: Secondary | ICD-10-CM | POA: Diagnosis not present

## 2019-12-10 DIAGNOSIS — J9621 Acute and chronic respiratory failure with hypoxia: Secondary | ICD-10-CM | POA: Diagnosis not present

## 2019-12-10 LAB — BASIC METABOLIC PANEL
Anion gap: 8 (ref 5–15)
BUN: 42 mg/dL — ABNORMAL HIGH (ref 8–23)
CO2: 33 mmol/L — ABNORMAL HIGH (ref 22–32)
Calcium: 8 mg/dL — ABNORMAL LOW (ref 8.9–10.3)
Chloride: 104 mmol/L (ref 98–111)
Creatinine, Ser: 0.85 mg/dL (ref 0.61–1.24)
GFR calc Af Amer: >60 mL/min (ref >60)
GFR calc non Af Amer: >60 mL/min (ref >60)
Glucose, Bld: 237 mg/dL — ABNORMAL HIGH (ref 70–99)
Potassium: 3.2 mmol/L — ABNORMAL LOW (ref 3.5–5.1)
Sodium: 145 mmol/L (ref 135–145)

## 2019-12-10 LAB — MAGNESIUM: Magnesium: 1.7 mg/dL (ref 1.7–2.4)

## 2019-12-10 LAB — CBC
HCT: 25.8 % — ABNORMAL LOW (ref 39.0–52.0)
Hemoglobin: 8 g/dL — ABNORMAL LOW (ref 13.0–17.0)
MCH: 27.2 pg (ref 26.0–34.0)
MCHC: 31 g/dL (ref 30.0–36.0)
MCV: 87.8 fL (ref 80.0–100.0)
Platelets: 153 10*3/uL (ref 150–400)
RBC: 2.94 MIL/uL — ABNORMAL LOW (ref 4.22–5.81)
RDW: 20.5 % — ABNORMAL HIGH (ref 11.5–15.5)
WBC: 13.5 10*3/uL — ABNORMAL HIGH (ref 4.0–10.5)
nRBC: 0.1 % (ref 0.0–0.2)

## 2019-12-10 NOTE — Progress Notes (Signed)
Pulmonary Critical Care Medicine [place]   PULMONARY CRITICAL CARE SERVICE  PROGRESS NOTE  Date of Service: 12/10/2019  Dammon Makarewicz  MWU:[id number]  DOB: Aug 24, 1946   DOA: 11/21/2019  Referring Physician: [name], MD  HPI: Shane Banks is a 74 y.o. male seen for follow up of Acute on Chronic Respiratory Failure.  Patient has been on the NAG on 5 L  Medications: Reviewed on Rounds  Physical Exam:  Vitals: Temperature is 97.2 pulse 65 respiratory 33 blood pressure is 136/76 saturations 97%  Ventilator Settings on NAG on 5 L  . General: Comfortable at this time . Eyes: Grossly normal lids, irises & conjunctiva . ENT: grossly tongue is normal . Neck: no obvious mass . Cardiovascular: S1 S2 normal no gallop . Respiratory: No rhonchi no rales are noted at this time . Abdomen: soft . Skin: no rash seen on limited exam . Musculoskeletal: not rigid . Psychiatric:unable to assess . Neurologic: no seizure no involuntary movements         Lab Data:   Basic Metabolic Panel: Recent Labs  Lab 12/04/19 0530 12/06/19 0543 12/07/19 0645 12/08/19 0622 12/09/19 0442 12/10/19 0557  NA 146* 142  --  142  --  145  K 3.8 3.6  --  3.4* 3.6 3.2*  CL 108 105  --  102  --  104  CO2 29 31  --  29  --  33*  GLUCOSE 104* 141*  --  123*  --  237*  BUN 37* 30*  --  41*  --  42*  CREATININE 0.95 0.83  --  0.81  --  0.85  CALCIUM 8.0* 7.7*  --  7.8*  --  8.0*  MG 1.8 1.6* 1.9  --   --  1.7  PHOS 2.9  --   --   --   --   --     ABG: No results for input(s): PHART, PCO2ART, PO2ART, HCO3, O2SAT in the last 168 hours.  Liver Function Tests: No results for input(s): AST, ALT, ALKPHOS, BILITOT, PROT, ALBUMIN in the last 168 hours. No results for input(s): LIPASE, AMYLASE in the last 168 hours. No results for input(s): AMMONIA in the last 168 hours.  CBC: Recent Labs  Lab 12/04/19 0530 12/07/19 0645 12/08/19 0622 12/10/19 0557  WBC 12.3* 14.3* 17.8* 13.5*   HGB 7.7* 6.8* 8.6* 8.0*  HCT 25.0* 22.1* 27.3* 25.8*  MCV 88.7 87.0 86.4 87.8  PLT 210 146* 162 153    Cardiac Enzymes: No results for input(s): CKTOTAL, CKMB, CKMBINDEX, TROPONINI in the last 168 hours.  BNP (last 3 results) No results for input(s): BNP in the last 8760 hours.  ProBNP (last 3 results) No results for input(s): PROBNP in the last 8760 hours.  Radiological Exams: DG CHEST PORT 1 VIEW  Result Date: 12/09/2019 CLINICAL DATA:  Status update EXAM: PORTABLE CHEST 1 VIEW COMPARISON:  Four days ago FINDINGS: Cardiomegaly. Tracheostomy tube in place. Pulmonary opacity on both sides with mild improvement in left base aeration. There is likely pleural fluid. No pneumothorax. IMPRESSION: Bilateral pneumonia with mildly improved aeration on the left. Electronically Signed   By: Monte Fantasia M.D.   On: 12/09/2019 07:28    Assessment/Plan Active Problems:   Acute on chronic respiratory failure with hypoxia (HCC)   COVID-19 virus infection   Atrial fibrillation with RVR (HCC)   Severe sepsis (HCC)   Pneumonia due to COVID-19 virus   1. Acute on chronic respiratory failure  with hypoxia we will continue with the NAG titrate oxygen continue pulmonary toilet 2. COVID-19 virus infection treated in resolution phase 3. Atrial fibrillation rate controlled 4. Severe sepsis resolved 5. Pneumonia treated we will continue to monitor   I have personally seen and evaluated the patient, evaluated laboratory and imaging results, formulated the assessment and plan and placed orders. The Patient requires high complexity decision making with multiple systems involvement.  Rounds were done with the Respiratory Therapy Director and Staff therapists and discussed with nursing staff also.  Allyne Gee, MD [facility] Pulmonary Critical Care Medicine Sleep Medicine

## 2019-12-11 ENCOUNTER — Institutional Professional Consult (permissible substitution) (HOSPITAL_COMMUNITY): Payer: Medicare HMO

## 2019-12-11 ENCOUNTER — Other Ambulatory Visit (HOSPITAL_COMMUNITY): Payer: Medicare HMO

## 2019-12-11 DIAGNOSIS — Z9911 Dependence on respirator [ventilator] status: Secondary | ICD-10-CM

## 2019-12-11 DIAGNOSIS — I4891 Unspecified atrial fibrillation: Secondary | ICD-10-CM | POA: Diagnosis not present

## 2019-12-11 DIAGNOSIS — U071 COVID-19: Secondary | ICD-10-CM | POA: Diagnosis not present

## 2019-12-11 DIAGNOSIS — A419 Sepsis, unspecified organism: Secondary | ICD-10-CM | POA: Diagnosis not present

## 2019-12-11 DIAGNOSIS — J9621 Acute and chronic respiratory failure with hypoxia: Secondary | ICD-10-CM | POA: Diagnosis not present

## 2019-12-11 LAB — BASIC METABOLIC PANEL
Anion gap: 8 (ref 5–15)
BUN: 31 mg/dL — ABNORMAL HIGH (ref 8–23)
CO2: 34 mmol/L — ABNORMAL HIGH (ref 22–32)
Calcium: 8.1 mg/dL — ABNORMAL LOW (ref 8.9–10.3)
Chloride: 105 mmol/L (ref 98–111)
Creatinine, Ser: 0.96 mg/dL (ref 0.61–1.24)
GFR calc Af Amer: >60 mL/min (ref >60)
GFR calc non Af Amer: >60 mL/min (ref >60)
Glucose, Bld: 204 mg/dL — ABNORMAL HIGH (ref 70–99)
Potassium: 3.5 mmol/L (ref 3.5–5.1)
Sodium: 147 mmol/L — ABNORMAL HIGH (ref 135–145)

## 2019-12-11 LAB — BLOOD GAS, ARTERIAL
Acid-Base Excess: 13 mmol/L — ABNORMAL HIGH (ref 0.0–2.0)
Acid-Base Excess: 10.7 mmol/L — ABNORMAL HIGH (ref 0.0–2.0)
Bicarbonate: 36.9 mmol/L — ABNORMAL HIGH (ref 20.0–28.0)
Bicarbonate: 35.9 mmol/L — ABNORMAL HIGH (ref 20.0–28.0)
FIO2: 75
FIO2: 100
O2 Saturation: 96 %
O2 Saturation: 77 %
Patient temperature: 37
Patient temperature: 37
pCO2 arterial: 44.3 mmHg (ref 32.0–48.0)
pCO2 arterial: 59.9 mmHg — ABNORMAL HIGH (ref 32.0–48.0)
pH, Arterial: 7.53 — ABNORMAL HIGH (ref 7.350–7.450)
pH, Arterial: 7.395 (ref 7.350–7.450)
pO2, Arterial: 74.1 mmHg — ABNORMAL LOW (ref 83.0–108.0)
pO2, Arterial: 48.9 mmHg — ABNORMAL LOW (ref 83.0–108.0)

## 2019-12-11 LAB — URINALYSIS, ROUTINE W REFLEX MICROSCOPIC
Bilirubin Urine: NEGATIVE
Glucose, UA: NEGATIVE mg/dL
Hgb urine dipstick: NEGATIVE
Ketones, ur: NEGATIVE mg/dL
Nitrite: NEGATIVE
Protein, ur: 30 mg/dL — AB
Specific Gravity, Urine: 1.018 (ref 1.005–1.030)
pH: 6 (ref 5.0–8.0)

## 2019-12-11 LAB — PROCALCITONIN: Procalcitonin: 0.85 ng/mL

## 2019-12-11 LAB — LACTIC ACID, PLASMA: Lactic Acid, Venous: 2 mmol/L (ref 0.5–1.9)

## 2019-12-11 LAB — MAGNESIUM: Magnesium: 2 mg/dL (ref 1.7–2.4)

## 2019-12-11 NOTE — Progress Notes (Signed)
Pulmonary Critical Care Medicine [place]   PULMONARY CRITICAL CARE SERVICE  PROGRESS NOTE  Date of Service: 12/11/2019  Shane Banks  MWU:[id number]  DOB: 27-Jan-1946   DOA: 11/21/2019  Referring Physician: [name], MD  HPI: Shane Banks is a 74 y.o. male seen for follow up of Acute on Chronic Respiratory Failure.  Patient has had a significant decline in status was increased to 100% FiO2 overnight placed back on assist control mode.  This morning patient was asynchronous with the ventilator.  Spoke with respiratory therapy as well as nursing staff and will going to go ahead and start him on propofol.  In addition patient did have a fever and needs to be worked up.  Have gone ahead and ordered chest x-ray along with sputum cultures urine cultures and blood cultures  Medications: Reviewed on Rounds  Physical Exam:  Vitals: Temperature 100.2 pulse 80 respiratory rate 32 blood pressure is 137/81 saturations 92%  Ventilator Settings mode of ventilation assist control FiO2 100% PEEP 5 tidal volume was 300  . General: Comfortable at this time . Eyes: Grossly normal lids, irises & conjunctiva . ENT: grossly tongue is normal . Neck: no obvious mass . Cardiovascular: S1 S2 normal no gallop . Respiratory: Scattered rhonchi expansion is equal . Abdomen: soft . Skin: no rash seen on limited exam . Musculoskeletal: not rigid . Psychiatric:unable to assess . Neurologic: no seizure no involuntary movements         Lab Data:   Basic Metabolic Panel: Recent Labs  Lab 12/06/19 0543 12/07/19 0645 12/08/19 0622 12/09/19 0442 12/10/19 0557 12/11/19 0418  NA 142  --  142  --  145 147*  K 3.6  --  3.4* 3.6 3.2* 3.5  CL 105  --  102  --  104 105  CO2 31  --  29  --  33* 34*  GLUCOSE 141*  --  123*  --  237* 204*  BUN 30*  --  41*  --  42* 31*  CREATININE 0.83  --  0.81  --  0.85 0.96  CALCIUM 7.7*  --  7.8*  --  8.0* 8.1*  MG 1.6* 1.9  --   --  1.7 2.0     ABG: Recent Labs  Lab 12/11/19 0200  PHART 7.530*  PCO2ART 44.3  PO2ART 74.1*  HCO3 36.9*  O2SAT 96.0    Liver Function Tests: No results for input(s): AST, ALT, ALKPHOS, BILITOT, PROT, ALBUMIN in the last 168 hours. No results for input(s): LIPASE, AMYLASE in the last 168 hours. No results for input(s): AMMONIA in the last 168 hours.  CBC: Recent Labs  Lab 12/07/19 0645 12/08/19 0622 12/10/19 0557  WBC 14.3* 17.8* 13.5*  HGB 6.8* 8.6* 8.0*  HCT 22.1* 27.3* 25.8*  MCV 87.0 86.4 87.8  PLT 146* 162 153    Cardiac Enzymes: No results for input(s): CKTOTAL, CKMB, CKMBINDEX, TROPONINI in the last 168 hours.  BNP (last 3 results) No results for input(s): BNP in the last 8760 hours.  ProBNP (last 3 results) No results for input(s): PROBNP in the last 8760 hours.  Radiological Exams: No results found.  Assessment/Plan Active Problems:   Acute on chronic respiratory failure with hypoxia (HCC)   COVID-19 virus infection   Atrial fibrillation with RVR (HCC)   Severe sepsis (HCC)   Pneumonia due to COVID-19 virus   1. Acute on chronic respiratory failure with hypoxia patient right now is on full vent support has been  asynchronous with the ventilator I think once we sedate him we will be able to better control his ventilation.  ABG actually shows not a problem of ventilation otherwise problem of oxygenation will also need to have his PEEP increased to have written for in order to maximize PEEP up to a maximum of 12 2. Fever sepsis antibiotics have been adjusted cultures have been ordered we will follow up on the cultures. 3. COVID-19 virus infection recovery phase 4. Pneumonia due to COVID-19 last chest x-ray had shown some increase in infiltrates we will do follow-up chest x-ray. 5. Atrial fibrillation with rapid ventricular response rate now rate controlled we will continue to follow along.   I have personally seen and evaluated the patient, evaluated laboratory  and imaging results, formulated the assessment and plan and placed orders. The Patient requires high complexity decision making with multiple systems involvement.  Rounds were done with the Respiratory Therapy Director and Staff therapists and discussed with nursing staff also.  Time 35 minutes patient is critically ill in danger of cardiac arrest and death will require sedation and close monitoring  Allyne Gee, MD [facility] Pulmonary Critical Care Medicine Sleep Medicine

## 2019-12-12 ENCOUNTER — Other Ambulatory Visit (HOSPITAL_COMMUNITY): Payer: Medicare HMO

## 2019-12-12 DIAGNOSIS — A419 Sepsis, unspecified organism: Secondary | ICD-10-CM | POA: Diagnosis not present

## 2019-12-12 DIAGNOSIS — I4891 Unspecified atrial fibrillation: Secondary | ICD-10-CM | POA: Diagnosis not present

## 2019-12-12 DIAGNOSIS — J9621 Acute and chronic respiratory failure with hypoxia: Secondary | ICD-10-CM | POA: Diagnosis not present

## 2019-12-12 DIAGNOSIS — U071 COVID-19: Secondary | ICD-10-CM | POA: Diagnosis not present

## 2019-12-12 LAB — RENAL FUNCTION PANEL
Albumin: 1.4 g/dL — ABNORMAL LOW (ref 3.5–5.0)
Anion gap: 9 (ref 5–15)
BUN: 50 mg/dL — ABNORMAL HIGH (ref 8–23)
CO2: 33 mmol/L — ABNORMAL HIGH (ref 22–32)
Calcium: 7.8 mg/dL — ABNORMAL LOW (ref 8.9–10.3)
Chloride: 102 mmol/L (ref 98–111)
Creatinine, Ser: 1.75 mg/dL — ABNORMAL HIGH (ref 0.61–1.24)
GFR calc Af Amer: 44 mL/min — ABNORMAL LOW (ref >60)
GFR calc non Af Amer: 38 mL/min — ABNORMAL LOW (ref >60)
Glucose, Bld: 243 mg/dL — ABNORMAL HIGH (ref 70–99)
Phosphorus: 2.7 mg/dL (ref 2.5–4.6)
Potassium: 4 mmol/L (ref 3.5–5.1)
Sodium: 144 mmol/L (ref 135–145)

## 2019-12-12 LAB — CBC
HCT: 21.9 % — ABNORMAL LOW (ref 39.0–52.0)
Hemoglobin: 6.6 g/dL — CL (ref 13.0–17.0)
MCH: 27.3 pg (ref 26.0–34.0)
MCHC: 30.1 g/dL (ref 30.0–36.0)
MCV: 90.5 fL (ref 80.0–100.0)
Platelets: 137 10*3/uL — ABNORMAL LOW (ref 150–400)
RBC: 2.42 MIL/uL — ABNORMAL LOW (ref 4.22–5.81)
RDW: 21.3 % — ABNORMAL HIGH (ref 11.5–15.5)
WBC: 16.3 10*3/uL — ABNORMAL HIGH (ref 4.0–10.5)
nRBC: 0.3 % — ABNORMAL HIGH (ref 0.0–0.2)

## 2019-12-12 LAB — URINE CULTURE: Culture: <10000 — AB

## 2019-12-12 LAB — PREPARE RBC (CROSSMATCH)

## 2019-12-12 LAB — LACTIC ACID, PLASMA: Lactic Acid, Venous: 2.1 mmol/L (ref 0.5–1.9)

## 2019-12-12 LAB — MAGNESIUM: Magnesium: 2 mg/dL (ref 1.7–2.4)

## 2019-12-12 NOTE — Progress Notes (Signed)
Pulmonary Critical Care Medicine [place]   PULMONARY CRITICAL CARE SERVICE  PROGRESS NOTE  Date of Service: 12/12/2019  Shane Banks  ZJI:[id number]  DOB: August 06, 1946   DOA: 11/21/2019  Referring Physician: [name], MD  HPI: Shane Banks is a 74 y.o. male seen for follow up of Acute on Chronic Respiratory Failure.  Patient is comfortable right now without any distress at this time.  Remains on the ventilator sedated right now is on 60% FiO2  Medications: Reviewed on Rounds  Physical Exam:  Vitals: Temperature is 96.8 pulse 60 respiratory rate 37 blood pressure is 85/41 saturations 93%  Ventilator Settings on assist control FiO2 60% tidal line 371 PEEP 12  . General: Comfortable at this time . Eyes: Grossly normal lids, irises & conjunctiva . ENT: grossly tongue is normal . Neck: no obvious mass . Cardiovascular: S1 S2 normal no gallop . Respiratory: No rhonchi no rales are noted . Abdomen: soft . Skin: no rash seen on limited exam . Musculoskeletal: not rigid . Psychiatric:unable to assess . Neurologic: no seizure no involuntary movements         Lab Data:   Basic Metabolic Panel: Recent Labs  Lab 12/06/19 0543 12/06/19 0543 12/07/19 0645 12/08/19 0622 12/09/19 1751 12/10/19 0557 12/11/19 0418 12/12/19 0411  NA 142  --   --  142  --  145 147* 144  K 3.6   < >  --  3.4* 3.6 3.2* 3.5 4.0  CL 105  --   --  102  --  104 105 102  CO2 31  --   --  29  --  33* 34* 33*  GLUCOSE 141*  --   --  123*  --  237* 204* 243*  BUN 30*  --   --  41*  --  42* 31* 50*  CREATININE 0.83  --   --  0.81  --  0.85 0.96 1.75*  CALCIUM 7.7*  --   --  7.8*  --  8.0* 8.1* 7.8*  MG 1.6*  --  1.9  --   --  1.7 2.0 2.0  PHOS  --   --   --   --   --   --   --  2.7   < > = values in this interval not displayed.    ABG: Recent Labs  Lab 12/11/19 0200 12/11/19 1054  PHART 7.530* 7.395  PCO2ART 44.3 59.9*  PO2ART 74.1* 48.9*  HCO3 36.9* 35.9*  O2SAT 96.0  77.0    Liver Function Tests: Recent Labs  Lab 12/12/19 0411  ALBUMIN 1.4*   No results for input(s): LIPASE, AMYLASE in the last 168 hours. No results for input(s): AMMONIA in the last 168 hours.  CBC: Recent Labs  Lab 12/07/19 0645 12/08/19 0622 12/10/19 0557 12/12/19 0411  WBC 14.3* 17.8* 13.5* 16.3*  HGB 6.8* 8.6* 8.0* 6.6*  HCT 22.1* 27.3* 25.8* 21.9*  MCV 87.0 86.4 87.8 90.5  PLT 146* 162 153 137*    Cardiac Enzymes: No results for input(s): CKTOTAL, CKMB, CKMBINDEX, TROPONINI in the last 168 hours.  BNP (last 3 results) No results for input(s): BNP in the last 8760 hours.  ProBNP (last 3 results) No results for input(s): PROBNP in the last 8760 hours.  Radiological Exams: DG CHEST PORT 1 VIEW  Result Date: 12/12/2019 CLINICAL DATA:  Pneumonia. EXAM: PORTABLE CHEST 1 VIEW COMPARISON:  12/11/2019.  12/09/2019. FINDINGS: Tracheostomy tube noted in stable position. Heart size stable. Persistent  diffuse bilateral pulmonary infiltrates are noted without interim change. Small bilateral pleural effusions again may be present. No pneumothorax. IMPRESSION: 1.  Tracheostomy tube in stable position. 2.  Stable cardiomegaly. 3. Persistent diffuse bilateral pulmonary infiltrates are again noted without interim change. Small bilateral pleural effusions again may be present. Electronically Signed   By: Marcello Moores  Register   On: 12/12/2019 07:44   DG Chest Port 1 View  Result Date: 12/11/2019 CLINICAL DATA:  Pneumonia EXAM: PORTABLE CHEST 1 VIEW COMPARISON:  12/09/2019 FINDINGS: Bilateral opacities are again identified with relative sparing of the lung apices. Aeration has overall worsened with some improvement at the lung bases. Probable bilateral pleural effusions. Cardiomediastinal silhouette is obscured. Tracheostomy device is present. No pneumothorax. IMPRESSION: Overall worsening lung aeration since 12/09/2019. Some improvement at the lung bases. Electronically Signed   By: Macy Mis M.D.   On: 12/11/2019 10:31    Assessment/Plan Active Problems:   Acute on chronic respiratory failure with hypoxia (HCC)   COVID-19 virus infection   Atrial fibrillation with RVR (HCC)   Severe sepsis (HCC)   Pneumonia due to COVID-19 virus   1. Acute on chronic respiratory failure with hypoxia we will continue with full support on the ventilator.  Blood pressure is tenuous need to monitor closely.  Patient did have last chest x-ray which showed diffuse infiltrates still present secondary to the COVID-19. 2. COVID-19 virus infection now is in resolution phase we will continue to monitor 3. Atrial fibrillation with RVR treated we will continue to follow 4. Severe sepsis no change hemodynamics are borderline with low blood pressure likely also affected by the propofol 5. Pneumonia due to COVID-19 patient has residual changes noted on the chest x-ray with some worsening.  Consider fluid overload   I have personally seen and evaluated the patient, evaluated laboratory and imaging results, formulated the assessment and plan and placed orders. The Patient requires high complexity decision making with multiple systems involvement.  Rounds were done with the Respiratory Therapy Director and Staff therapists and discussed with nursing staff also.  Time 35 minutes  Allyne Gee, MD [facility] Pulmonary Critical Care Medicine Sleep Medicine

## 2019-12-13 ENCOUNTER — Other Ambulatory Visit (HOSPITAL_COMMUNITY): Payer: Medicare HMO

## 2019-12-13 DIAGNOSIS — U071 COVID-19: Secondary | ICD-10-CM | POA: Diagnosis not present

## 2019-12-13 DIAGNOSIS — I4891 Unspecified atrial fibrillation: Secondary | ICD-10-CM | POA: Diagnosis not present

## 2019-12-13 DIAGNOSIS — A419 Sepsis, unspecified organism: Secondary | ICD-10-CM | POA: Diagnosis not present

## 2019-12-13 DIAGNOSIS — J9621 Acute and chronic respiratory failure with hypoxia: Secondary | ICD-10-CM | POA: Diagnosis not present

## 2019-12-13 LAB — TYPE AND SCREEN
ABO/RH(D): O POS
Antibody Screen: NEGATIVE
Unit division: 0

## 2019-12-13 LAB — BPAM RBC
Blood Product Expiration Date: 202104142359
ISSUE DATE / TIME: 202103161342
Unit Type and Rh: 5100

## 2019-12-13 LAB — RENAL FUNCTION PANEL
Albumin: 1.4 g/dL — ABNORMAL LOW (ref 3.5–5.0)
Anion gap: 14 (ref 5–15)
BUN: 71 mg/dL — ABNORMAL HIGH (ref 8–23)
CO2: 26 mmol/L (ref 22–32)
Calcium: 8 mg/dL — ABNORMAL LOW (ref 8.9–10.3)
Chloride: 97 mmol/L — ABNORMAL LOW (ref 98–111)
Creatinine, Ser: 2.16 mg/dL — ABNORMAL HIGH (ref 0.61–1.24)
GFR calc Af Amer: 34 mL/min — ABNORMAL LOW (ref >60)
GFR calc non Af Amer: 30 mL/min — ABNORMAL LOW (ref >60)
Glucose, Bld: 161 mg/dL — ABNORMAL HIGH (ref 70–99)
Phosphorus: 3.2 mg/dL (ref 2.5–4.6)
Potassium: 4.5 mmol/L (ref 3.5–5.1)
Sodium: 137 mmol/L (ref 135–145)

## 2019-12-13 LAB — CBC
HCT: 26.9 % — ABNORMAL LOW (ref 39.0–52.0)
Hemoglobin: 8.4 g/dL — ABNORMAL LOW (ref 13.0–17.0)
MCH: 27.6 pg (ref 26.0–34.0)
MCHC: 31.2 g/dL (ref 30.0–36.0)
MCV: 88.5 fL (ref 80.0–100.0)
Platelets: 133 10*3/uL — ABNORMAL LOW (ref 150–400)
RBC: 3.04 MIL/uL — ABNORMAL LOW (ref 4.22–5.81)
RDW: 20 % — ABNORMAL HIGH (ref 11.5–15.5)
WBC: 18 10*3/uL — ABNORMAL HIGH (ref 4.0–10.5)
nRBC: 0.3 % — ABNORMAL HIGH (ref 0.0–0.2)

## 2019-12-13 LAB — MAGNESIUM: Magnesium: 1.9 mg/dL (ref 1.7–2.4)

## 2019-12-13 LAB — GENTAMICIN LEVEL, TROUGH
Gentamicin Trough: 13.5 ug/mL (ref 0.5–2.0)
Gentamicin Trough: 12.6 ug/mL (ref 0.5–2.0)
Gentamicin Trough: 11.1 ug/mL (ref 0.5–2.0)

## 2019-12-13 NOTE — Progress Notes (Signed)
Pulmonary Critical Care Medicine [place]   PULMONARY CRITICAL CARE SERVICE  PROGRESS NOTE  Date of Service: 12/13/2019  Zinedine Ellner  IEP:[id number]  DOB: 1946/09/25   DOA: 11/21/2019  Referring Physician: [name], MD  HPI: Robertlee Rogacki is a 74 y.o. male seen for follow up of Acute on Chronic Respiratory Failure.  Patient is currently on full support on assist control mode has been on 60% FiO2 good volumes are noted but PEEP is still at 12  Medications: Reviewed on Rounds  Physical Exam:  Vitals: Temperature 95.9 pulse 56 respiratory rate 34 blood pressure is 102/51 saturations 96%  Ventilator Settings on assist control FiO2 of 60% tidal volume 350 PEEP 12  . General: Comfortable at this time . Eyes: Grossly normal lids, irises & conjunctiva . ENT: grossly tongue is normal . Neck: no obvious mass . Cardiovascular: S1 S2 normal no gallop . Respiratory: No rhonchi no rales are noted at this time . Abdomen: soft . Skin: no rash seen on limited exam . Musculoskeletal: not rigid . Psychiatric:unable to assess . Neurologic: no seizure no involuntary movements         Lab Data:   Basic Metabolic Panel: Recent Labs  Lab 12/07/19 0645 12/08/19 0622 12/08/19 6606 12/09/19 3016 12/10/19 0557 12/11/19 0418 12/12/19 0411 12/13/19 0223  NA  --  142  --   --  145 147* 144 137  K  --  3.4*   < > 3.6 3.2* 3.5 4.0 4.5  CL  --  102  --   --  104 105 102 97*  CO2  --  29  --   --  33* 34* 33* 26  GLUCOSE  --  123*  --   --  237* 204* 243* 161*  BUN  --  41*  --   --  42* 31* 50* 71*  CREATININE  --  0.81  --   --  0.85 0.96 1.75* 2.16*  CALCIUM  --  7.8*  --   --  8.0* 8.1* 7.8* 8.0*  MG 1.9  --   --   --  1.7 2.0 2.0 1.9  PHOS  --   --   --   --   --   --  2.7 3.2   < > = values in this interval not displayed.    ABG: Recent Labs  Lab 12/11/19 0200 12/11/19 1054  PHART 7.530* 7.395  PCO2ART 44.3 59.9*  PO2ART 74.1* 48.9*  HCO3 36.9* 35.9*   O2SAT 96.0 77.0    Liver Function Tests: Recent Labs  Lab 12/12/19 0411 12/13/19 0223  ALBUMIN 1.4* 1.4*   No results for input(s): LIPASE, AMYLASE in the last 168 hours. No results for input(s): AMMONIA in the last 168 hours.  CBC: Recent Labs  Lab 12/07/19 0645 12/08/19 0622 12/10/19 0557 12/12/19 0411 12/13/19 0223  WBC 14.3* 17.8* 13.5* 16.3* 18.0*  HGB 6.8* 8.6* 8.0* 6.6* 8.4*  HCT 22.1* 27.3* 25.8* 21.9* 26.9*  MCV 87.0 86.4 87.8 90.5 88.5  PLT 146* 162 153 137* 133*    Cardiac Enzymes: No results for input(s): CKTOTAL, CKMB, CKMBINDEX, TROPONINI in the last 168 hours.  BNP (last 3 results) No results for input(s): BNP in the last 8760 hours.  ProBNP (last 3 results) No results for input(s): PROBNP in the last 8760 hours.  Radiological Exams: US RENAL  Result Date: 12/12/2019 CLINICAL DATA:  Acute renal failure EXAM: RENAL / URINARY TRACT ULTRASOUND COMPLETE COMPARISON:  None. FINDINGS: Right Kidney: Renal measurements: 11 x 5.5 x 5.7 cm = volume: 129.3 mL. Cortex appears slightly echogenic. No hydronephrosis. Cyst in the mid to upper pole measuring 12 mm. Trace perinephric fluid. Left Kidney: Renal measurements: 11.6 x 4.4 x 5.6 cm = volume: 149.8 mL. Cortex appears slightly echogenic. No hydronephrosis. Trace perinephric fluid. Bladder: Decompressed by Foley catheter. Other: Small amount of ascites. Enlarged spleen with volume of 733.8 mL. Incidental note made of a left pleural effusion. IMPRESSION: 1. Slightly echogenic kidneys consistent with medical renal disease. No hydronephrosis 2. Splenomegaly 3. Small amount of ascites.  Small left pleural effusion. Electronically Signed   By: Donavan Foil M.D.   On: 12/12/2019 16:52   DG CHEST PORT 1 VIEW  Result Date: 12/13/2019 CLINICAL DATA:  Ventilator dependence.  Pneumonia. EXAM: PORTABLE CHEST 1 VIEW COMPARISON:  Yesterday FINDINGS: Extensive pulmonary opacity on the left more than right. Cardiomegaly.  Tracheostomy tube in good position. IMPRESSION: Unchanged extensive airspace disease in the left more than right. Electronically Signed   By: Monte Fantasia M.D.   On: 12/13/2019 07:55   DG CHEST PORT 1 VIEW  Result Date: 12/12/2019 CLINICAL DATA:  Pneumonia. EXAM: PORTABLE CHEST 1 VIEW COMPARISON:  12/11/2019.  12/09/2019. FINDINGS: Tracheostomy tube noted in stable position. Heart size stable. Persistent diffuse bilateral pulmonary infiltrates are noted without interim change. Small bilateral pleural effusions again may be present. No pneumothorax. IMPRESSION: 1.  Tracheostomy tube in stable position. 2.  Stable cardiomegaly. 3. Persistent diffuse bilateral pulmonary infiltrates are again noted without interim change. Small bilateral pleural effusions again may be present. Electronically Signed   By: Marcello Moores  Register   On: 12/12/2019 07:44   DG Chest Port 1 View  Result Date: 12/11/2019 CLINICAL DATA:  Pneumonia EXAM: PORTABLE CHEST 1 VIEW COMPARISON:  12/09/2019 FINDINGS: Bilateral opacities are again identified with relative sparing of the lung apices. Aeration has overall worsened with some improvement at the lung bases. Probable bilateral pleural effusions. Cardiomediastinal silhouette is obscured. Tracheostomy device is present. No pneumothorax. IMPRESSION: Overall worsening lung aeration since 12/09/2019. Some improvement at the lung bases. Electronically Signed   By: Macy Mis M.D.   On: 12/11/2019 10:31    Assessment/Plan Active Problems:   Acute on chronic respiratory failure with hypoxia (HCC)   COVID-19 virus infection   Atrial fibrillation with RVR (HCC)   Severe sepsis (HCC)   Pneumonia due to COVID-19 virus   1. Acute on chronic respiratory failure hypoxia plan is to continue with full support on assist control right now is on 60% FiO2 good tidal volumes are noted 2. COVID-19 virus infection in resolution phase 3. Atrial fibrillation rate now rate controlled 4. Severe  sepsis hemodynamics are stable 5. Pneumonia due to COVID-19 treated we will continue to monitor   I have personally seen and evaluated the patient, evaluated laboratory and imaging results, formulated the assessment and plan and placed orders. The Patient requires high complexity decision making with multiple systems involvement.  Rounds were done with the Respiratory Therapy Director and Staff therapists and discussed with nursing staff also.  Allyne Gee, MD [facility] Pulmonary Critical Care Medicine Sleep Medicine

## 2019-12-14 ENCOUNTER — Other Ambulatory Visit (HOSPITAL_COMMUNITY): Payer: Medicare HMO

## 2019-12-14 DIAGNOSIS — N179 Acute kidney failure, unspecified: Secondary | ICD-10-CM | POA: Diagnosis not present

## 2019-12-14 DIAGNOSIS — U071 COVID-19: Secondary | ICD-10-CM | POA: Diagnosis not present

## 2019-12-14 DIAGNOSIS — I4891 Unspecified atrial fibrillation: Secondary | ICD-10-CM | POA: Diagnosis not present

## 2019-12-14 DIAGNOSIS — J9621 Acute and chronic respiratory failure with hypoxia: Secondary | ICD-10-CM | POA: Diagnosis not present

## 2019-12-14 LAB — LACTIC ACID, PLASMA
Lactic Acid, Venous: 2.4 mmol/L (ref 0.5–1.9)
Lactic Acid, Venous: 2.3 mmol/L (ref 0.5–1.9)

## 2019-12-14 LAB — CBC
HCT: 27.5 % — ABNORMAL LOW (ref 39.0–52.0)
Hemoglobin: 8.7 g/dL — ABNORMAL LOW (ref 13.0–17.0)
MCH: 26.9 pg (ref 26.0–34.0)
MCHC: 31.6 g/dL (ref 30.0–36.0)
MCV: 84.9 fL (ref 80.0–100.0)
Platelets: 204 10*3/uL (ref 150–400)
RBC: 3.24 MIL/uL — ABNORMAL LOW (ref 4.22–5.81)
RDW: 20.4 % — ABNORMAL HIGH (ref 11.5–15.5)
WBC: 21.7 10*3/uL — ABNORMAL HIGH (ref 4.0–10.5)
nRBC: 0.8 % — ABNORMAL HIGH (ref 0.0–0.2)

## 2019-12-14 LAB — RENAL FUNCTION PANEL
Albumin: 1.6 g/dL — ABNORMAL LOW (ref 3.5–5.0)
Anion gap: 14 (ref 5–15)
BUN: 95 mg/dL — ABNORMAL HIGH (ref 8–23)
CO2: 26 mmol/L (ref 22–32)
Calcium: 8 mg/dL — ABNORMAL LOW (ref 8.9–10.3)
Chloride: 93 mmol/L — ABNORMAL LOW (ref 98–111)
Creatinine, Ser: 2.85 mg/dL — ABNORMAL HIGH (ref 0.61–1.24)
GFR calc Af Amer: 24 mL/min — ABNORMAL LOW (ref >60)
GFR calc non Af Amer: 21 mL/min — ABNORMAL LOW (ref >60)
Glucose, Bld: 107 mg/dL — ABNORMAL HIGH (ref 70–99)
Phosphorus: 4 mg/dL (ref 2.5–4.6)
Potassium: 5.5 mmol/L — ABNORMAL HIGH (ref 3.5–5.1)
Sodium: 133 mmol/L — ABNORMAL LOW (ref 135–145)

## 2019-12-14 LAB — MAGNESIUM: Magnesium: 2 mg/dL (ref 1.7–2.4)

## 2019-12-14 LAB — CULTURE, BLOOD (ROUTINE X 2): Special Requests: ADEQUATE

## 2019-12-14 NOTE — Progress Notes (Addendum)
Pulmonary Critical Care Medicine Shane Banks   PULMONARY CRITICAL CARE SERVICE  PROGRESS NOTE  Date of Service: 12/14/2019  Shane Banks  FTD:[id number]  DOB: October 06, 1945   DOA: 11/21/2019  Referring Physician: [name], MD  HPI: Shane Banks is a 74 y.o. male seen for follow up of Acute on Chronic Respiratory Failure.  Patient continues on full support ventilator, rate of 18 FiO2 60%.  Medications: Reviewed on Rounds  Physical Exam:  Vitals: Pulse 52 respirations 29 BP 117/67 O2 sat 99% temp 90.0  Ventilator Settings ventilator mode AC VC plus rate of 18 tidal volume 350 PEEP of 12 with FiO2 60%  . General: Comfortable at this time . Eyes: Grossly normal lids, irises & conjunctiva . ENT: grossly tongue is normal . Neck: no obvious mass . Cardiovascular: S1 S2 normal no gallop . Respiratory: No rales or rhonchi noted . Abdomen: soft . Skin: no rash seen on limited exam . Musculoskeletal: not rigid . Psychiatric:unable to assess . Neurologic: no seizure no involuntary movements         Lab Data:   Basic Metabolic Panel: Recent Labs  Lab 12/10/19 0557 12/11/19 0418 12/12/19 0411 12/13/19 0223 12/14/19 0738  NA 145 147* 144 137 133*  K 3.2* 3.5 4.0 4.5 5.5*  CL 104 105 102 97* 93*  CO2 33* 34* 33* 26 26  GLUCOSE 237* 204* 243* 161* 107*  BUN 42* 31* 50* 71* 95*  CREATININE 0.85 0.96 1.75* 2.16* 2.85*  CALCIUM 8.0* 8.1* 7.8* 8.0* 8.0*  MG 1.7 2.0 2.0 1.9 2.0  PHOS  --   --  2.7 3.2 4.0    ABG: Recent Labs  Lab 12/11/19 0200 12/11/19 1054  PHART 7.530* 7.395  PCO2ART 44.3 59.9*  PO2ART 74.1* 48.9*  HCO3 36.9* 35.9*  O2SAT 96.0 77.0    Liver Function Tests: Recent Labs  Lab 12/12/19 0411 12/13/19 0223 12/14/19 0738  ALBUMIN 1.4* 1.4* 1.6*   No results for input(s): LIPASE, AMYLASE in the last 168 hours. No results for input(s): AMMONIA in the last 168 hours.  CBC: Recent Labs  Lab 12/08/19 0622 12/10/19 0557  12/12/19 0411 12/13/19 0223 12/14/19 0738  WBC 17.8* 13.5* 16.3* 18.0* 21.7*  HGB 8.6* 8.0* 6.6* 8.4* 8.7*  HCT 27.3* 25.8* 21.9* 26.9* 27.5*  MCV 86.4 87.8 90.5 88.5 84.9  PLT 162 153 137* 133* 204    Cardiac Enzymes: No results for input(s): CKTOTAL, CKMB, CKMBINDEX, TROPONINI in the last 168 hours.  BNP (last 3 results) No results for input(s): BNP in the last 8760 hours.  ProBNP (last 3 results) No results for input(s): PROBNP in the last 8760 hours.  Radiological Exams: DG Chest Port 1 View  Result Date: 12/14/2019 CLINICAL DATA:  Ventilator dependence and pneumonia. COVID-19. EXAM: PORTABLE CHEST 1 VIEW COMPARISON:  Multiple prior exams most recently yesterday. FINDINGS: Tracheostomy tube tip remains at the thoracic inlet. Heterogeneous bilateral lung opacities are not significantly changed. Dense retrocardiac consolidation with air bronchograms, possible left pleural effusion. Small right pleural effusion appears increased from yesterday. Unchanged heart size and mediastinal contours. No pneumothorax. IMPRESSION: 1. Unchanged diffuse bilateral lung opacities and dense retrocardiac opacity with air bronchograms, likely sequela of COVID pneumonia. 2. Small right pleural effusion appears increased from yesterday. 3. Tracheostomy tube tip remains at the thoracic inlet. Electronically Signed   By: Keith Rake M.D.   On: 12/14/2019 12:50   DG CHEST PORT 1 VIEW  Result Date: 12/13/2019 CLINICAL DATA:  Ventilator dependence.  Pneumonia. EXAM: PORTABLE CHEST 1 VIEW COMPARISON:  Yesterday FINDINGS: Extensive pulmonary opacity on the left more than right. Cardiomegaly. Tracheostomy tube in good position. IMPRESSION: Unchanged extensive airspace disease in the left more than right. Electronically Signed   By: Monte Fantasia M.D.   On: 12/13/2019 07:55    Assessment/Plan Active Problems:   Acute on chronic respiratory failure with hypoxia (HCC)   COVID-19 virus infection   Atrial  fibrillation with RVR (HCC)   Severe sepsis (HCC)   Pneumonia due to COVID-19 virus   1. Acute on chronic respiratory failure hypoxia plan is to continue with full support on assist control right now is on 60% FiO2 good tidal volumes are noted 2. COVID-19 virus infection in resolution phase 3. Atrial fibrillation rate now rate controlled 4. Severe sepsis hemodynamics are stable 5. Pneumonia due to COVID-19 treated we will continue to monitor   I have personally seen and evaluated the patient, evaluated laboratory and imaging results, formulated the assessment and plan and placed orders. The Patient requires high complexity decision making with multiple systems involvement.  Rounds were done with the Respiratory Therapy Director and Staff therapists and discussed with nursing staff also.  Allyne Gee, MD [facility] Pulmonary Critical Care Medicine Sleep Medicine

## 2019-12-15 ENCOUNTER — Other Ambulatory Visit (HOSPITAL_COMMUNITY): Payer: Medicare HMO

## 2019-12-15 DIAGNOSIS — J9621 Acute and chronic respiratory failure with hypoxia: Secondary | ICD-10-CM | POA: Diagnosis not present

## 2019-12-15 DIAGNOSIS — U071 COVID-19: Secondary | ICD-10-CM | POA: Diagnosis not present

## 2019-12-15 DIAGNOSIS — I4891 Unspecified atrial fibrillation: Secondary | ICD-10-CM | POA: Diagnosis not present

## 2019-12-15 DIAGNOSIS — N179 Acute kidney failure, unspecified: Secondary | ICD-10-CM | POA: Diagnosis not present

## 2019-12-15 HISTORY — PX: IR FLUORO GUIDE CV LINE RIGHT: IMG2283

## 2019-12-15 HISTORY — PX: IR US GUIDE VASC ACCESS RIGHT: IMG2390

## 2019-12-15 LAB — CBC
HCT: 26.3 % — ABNORMAL LOW (ref 39.0–52.0)
HCT: 24.2 % — ABNORMAL LOW (ref 39.0–52.0)
Hemoglobin: 8.6 g/dL — ABNORMAL LOW (ref 13.0–17.0)
Hemoglobin: 7.8 g/dL — ABNORMAL LOW (ref 13.0–17.0)
MCH: 27.2 pg (ref 26.0–34.0)
MCH: 27.6 pg (ref 26.0–34.0)
MCHC: 32.7 g/dL (ref 30.0–36.0)
MCHC: 32.2 g/dL (ref 30.0–36.0)
MCV: 83.2 fL (ref 80.0–100.0)
MCV: 85.5 fL (ref 80.0–100.0)
Platelets: 192 10*3/uL (ref 150–400)
Platelets: 154 10*3/uL (ref 150–400)
RBC: 3.16 MIL/uL — ABNORMAL LOW (ref 4.22–5.81)
RBC: 2.83 MIL/uL — ABNORMAL LOW (ref 4.22–5.81)
RDW: 20.5 % — ABNORMAL HIGH (ref 11.5–15.5)
RDW: 20.9 % — ABNORMAL HIGH (ref 11.5–15.5)
WBC: 20.1 10*3/uL — ABNORMAL HIGH (ref 4.0–10.5)
WBC: 21.2 10*3/uL — ABNORMAL HIGH (ref 4.0–10.5)
nRBC: 0.6 % — ABNORMAL HIGH (ref 0.0–0.2)
nRBC: 0.2 % (ref 0.0–0.2)

## 2019-12-15 LAB — RENAL FUNCTION PANEL
Albumin: 1.5 g/dL — ABNORMAL LOW (ref 3.5–5.0)
Albumin: 1.4 g/dL — ABNORMAL LOW (ref 3.5–5.0)
Anion gap: 15 (ref 5–15)
Anion gap: 15 (ref 5–15)
BUN: 107 mg/dL — ABNORMAL HIGH (ref 8–23)
BUN: 123 mg/dL — ABNORMAL HIGH (ref 8–23)
CO2: 23 mmol/L (ref 22–32)
CO2: 22 mmol/L (ref 22–32)
Calcium: 7.9 mg/dL — ABNORMAL LOW (ref 8.9–10.3)
Calcium: 7.5 mg/dL — ABNORMAL LOW (ref 8.9–10.3)
Chloride: 91 mmol/L — ABNORMAL LOW (ref 98–111)
Chloride: 90 mmol/L — ABNORMAL LOW (ref 98–111)
Creatinine, Ser: 3.25 mg/dL — ABNORMAL HIGH (ref 0.61–1.24)
Creatinine, Ser: 3.48 mg/dL — ABNORMAL HIGH (ref 0.61–1.24)
GFR calc Af Amer: 21 mL/min — ABNORMAL LOW (ref >60)
GFR calc Af Amer: 19 mL/min — ABNORMAL LOW (ref >60)
GFR calc non Af Amer: 18 mL/min — ABNORMAL LOW (ref >60)
GFR calc non Af Amer: 17 mL/min — ABNORMAL LOW (ref >60)
Glucose, Bld: 164 mg/dL — ABNORMAL HIGH (ref 70–99)
Glucose, Bld: 88 mg/dL (ref 70–99)
Phosphorus: 4.9 mg/dL — ABNORMAL HIGH (ref 2.5–4.6)
Phosphorus: 4.8 mg/dL — ABNORMAL HIGH (ref 2.5–4.6)
Potassium: 5.4 mmol/L — ABNORMAL HIGH (ref 3.5–5.1)
Potassium: 5 mmol/L (ref 3.5–5.1)
Sodium: 129 mmol/L — ABNORMAL LOW (ref 135–145)
Sodium: 127 mmol/L — ABNORMAL LOW (ref 135–145)

## 2019-12-15 LAB — BLOOD GAS, ARTERIAL
Acid-Base Excess: 1.1 mmol/L (ref 0.0–2.0)
Bicarbonate: 25.5 mmol/L (ref 20.0–28.0)
FIO2: 55
O2 Saturation: 98.6 %
Patient temperature: 37.6
pCO2 arterial: 43.6 mmHg (ref 32.0–48.0)
pH, Arterial: 7.387 (ref 7.350–7.450)
pO2, Arterial: 123 mmHg — ABNORMAL HIGH (ref 83.0–108.0)

## 2019-12-15 LAB — HEPATITIS B SURFACE ANTIGEN: Hepatitis B Surface Ag: NONREACTIVE

## 2019-12-15 LAB — MAGNESIUM: Magnesium: 2 mg/dL (ref 1.7–2.4)

## 2019-12-15 LAB — GENTAMICIN LEVEL, TROUGH: Gentamicin Trough: 8.3 ug/mL (ref 0.5–2.0)

## 2019-12-15 LAB — HEPATITIS B CORE ANTIBODY, TOTAL: Hep B Core Total Ab: NONREACTIVE

## 2019-12-15 LAB — LACTIC ACID, PLASMA: Lactic Acid, Venous: 2.5 mmol/L (ref 0.5–1.9)

## 2019-12-15 MED ORDER — LIDOCAINE HCL 1 % IJ SOLN
INTRAMUSCULAR | Status: AC
  Filled 2019-12-15: qty 20

## 2019-12-15 MED ORDER — LIDOCAINE HCL 1 % IJ SOLN
INTRAMUSCULAR | Status: DC | PRN
  Administered 2019-12-15: 10 mL

## 2019-12-15 MED ORDER — HEPARIN SODIUM (PORCINE) 1000 UNIT/ML IJ SOLN
INTRAMUSCULAR | Status: AC
  Filled 2019-12-15: qty 1

## 2019-12-15 MED ORDER — HEPARIN SODIUM (PORCINE) 1000 UNIT/ML IJ SOLN
INTRAMUSCULAR | Status: DC | PRN
  Administered 2019-12-15: 1000 [IU] via INTRAVENOUS

## 2019-12-15 NOTE — Consult Note (Signed)
Consult note       [facility] CONSULT NOTE    Date: 12/15/2019                  Patient Name:  Shane Banks  MRN: [id number]  DOB: [birth year]  Age / Sex: 74 y.o., male         PCP: [name], MD                 Service Requesting Consult:  Hospitalist                 Reason for Consult:  Oliguric acute kidney injury            History of Present Illness: Patient is a 74 y.o. male with a PMHx of acute respiratory failure status post COVID-19 infection status post tracheostomy placement, atrial fibrillation, hypertension, CVA, gout, hypertrophic cardiomyopathy, who was admitted to [facility] on 11/21/2019 for ongoing treatment of acute respiratory failure.  He was admitted to the outside hospital for increasing shortness of breath from 10/06/2019 to 11/21/2019.  During this hospitalization patient was treated for COVID-19 pneumonia.  He was given steroids.  His respiratory failure worsened and he underwent intubation.  Patient ultimately underwent tracheostomy placement.  He also developed a sacral decubitus wound.  We are now asked to see him for evaluation management of acute kidney injury.  On 12/11/2019 his creatinine was 0.96.  Over this past week it has steadily increased.  BUN now 107 with a creatinine of 3.25.  He is not having urine output.  Serum sodium also low at 129 with a potassium of 5.4.  Primary team to discuss additional goals of care with the family today and will discuss if they would like to proceed with renal replacement therapy.  It appears that the patient's acute kidney injury is likely mediated by gentamicin toxicity.  He did have a trough level of 13.5 on 12/12/2019.  Renal ultrasound was also performed and was negative for hydronephrosis.   Medications:  Current medications: Ciprofloxacin 400 mg IV twice daily, propofol drip, Linezolid 600 mg IV twice daily, amlodipine 5 mg daily, clonazepam 0.5 mg 3 times daily, Pepcid 20 mg twice  daily, ferrous sulfate 300 mg twice daily, fish oil 6 g daily, Lantus insulin 25 units daily, levothyroxine 25 mcg daily, melatonin 3 mg nightly, midodrine 10 mg 3 times daily, Protonix 40 mg twice daily, Zoloft 25 mg daily, Flomax 0.4 mg daily, multivitamin 1 tablet daily, vitamin C 500 mg twice daily, vitamin D 1000 units daily   Allergies: Penicillin   Past Medical History: Past Medical History:  Diagnosis Date  . Acute on chronic respiratory failure with hypoxia ([place])   . Atrial fibrillation with RVR ([place])   . COVID-19 virus infection   . Pneumonia due to COVID-19 virus   . Severe sepsis (HCC)   Hypertension, hypothyroidism, CVA, gout, hypertrophic cardiomyopathy, atrial fibrillation   Past Surgical History: Tracheostomy placement PEG tube placement  Family History: Unable to obtain from patient as he is currently on the ventilator and sedated.  Social History: Unable to obtain from patient as he is currently on the ventilator and sedated.   Review of Systems: Unable to obtain from patient as he is currently on the ventilator and sedated.   Vital Signs: Temperature 98.5 pulse 50 respirations 30 blood pressure 157/68  Weight trends: There were no vitals filed for this visit.  Physical Exam: General: Critically ill-appearing  Head: Normocephalic,  atraumatic.  Eyes: Anicteric  Nose: Mucous membranes moist, not inflammed, nonerythematous.  Throat: Unable to visualize as patient sedated  Neck: Tracheostomy in place.  Lungs:  Bilateral rhonchi, vent assisted  Heart: S1S2 no rubs  Abdomen:  Soft NTND BS present  Extremities: trace pretibial edema.  Neurologic: Intubated, sedated, not following commands  Skin: No visible rashes, scars.    Lab results: Basic Metabolic Panel: Recent Labs  Lab 12/11/19 0418 12/11/19 0418 12/12/19 0411 12/12/19 0411 12/13/19 0223 12/14/19 0738 12/15/19 0424  NA 147*   < > 144   < > 137 133* 129*  K 3.5   < > 4.0   < > 4.5  5.5* 5.4*  CL 105   < > 102   < > 97* 93* 91*  CO2 34*   < > 33*   < > 26 26 23   GLUCOSE 204*   < > 243*   < > 161* 107* 164*  BUN 31*   < > 50*   < > 71* 95* 107*  CREATININE 0.96   < > 1.75*   < > 2.16* 2.85* 3.25*  CALCIUM 8.1*   < > 7.8*   < > 8.0* 8.0* 7.9*  MG 2.0  --  2.0  --  1.9 2.0 2.0  PHOS  --   --  2.7   < > 3.2 4.0 4.9*   < > = values in this interval not displayed.    Liver Function Tests: Recent Labs  Lab 12/13/19 0223 12/14/19 0738 12/15/19 0424  ALBUMIN 1.4* 1.6* 1.5*   No results for input(s): LIPASE, AMYLASE in the last 168 hours. No results for input(s): AMMONIA in the last 168 hours.  CBC: Recent Labs  Lab 12/10/19 0557 12/10/19 0557 12/12/19 0411 12/12/19 0411 12/13/19 0223 12/14/19 0738 12/15/19 0424  WBC 13.5*   < > 16.3*   < > 18.0* 21.7* 20.1*  HGB 8.0*   < > 6.6*   < > 8.4* 8.7* 8.6*  HCT 25.8*   < > 21.9*   < > 26.9* 27.5* 26.3*  MCV 87.8  --  90.5  --  88.5 84.9 83.2  PLT 153   < > 137*   < > 133* 204 192   < > = values in this interval not displayed.    Cardiac Enzymes: No results for input(s): CKTOTAL, CKMB, CKMBINDEX, TROPONINI in the last 168 hours.  BNP: Invalid input(s): POCBNP  CBG: No results for input(s): GLUCAP in the last 168 hours.  Microbiology: Results for orders placed or performed during the hospital encounter of 11/21/19  Culture, Urine     Status: Abnormal   Collection Time: 11/22/19  1:25 PM   Specimen: Urine, Random  Result Value Ref Range Status   Specimen Description URINE, RANDOM  Final   Special Requests   Final    NONE Performed at [facility], [address], [place], [place] [address]    Culture 50,000 COLONIES/mL YEAST (A)  Final   Report Status 11/23/2019 FINAL  Final  Culture, blood (routine x 2)     Status: None   Collection Time: 11/22/19  1:40 PM   Specimen: BLOOD LEFT HAND  Result Value Ref Range Status   Specimen Description BLOOD LEFT HAND  Final   Special Requests   Final     BOTTLES DRAWN AEROBIC ONLY Blood Culture results may not be optimal due to an inadequate volume of blood received in culture bottles  Culture   Final    NO GROWTH 5 DAYS Performed at [facility], [place] [address], [place], [place] [address]    Report Status 11/27/2019 FINAL  Final  Culture, blood (routine x 2)     Status: None   Collection Time: 11/22/19  1:44 PM   Specimen: BLOOD LEFT HAND  Result Value Ref Range Status   Specimen Description BLOOD LEFT HAND  Final   Special Requests   Final    BOTTLES DRAWN AEROBIC ONLY Blood Culture results may not be optimal due to an inadequate volume of blood received in culture bottles   Culture   Final    NO GROWTH 5 DAYS Performed at [facility], [place] [address] [place], [place] [address]    Report Status 11/27/2019 FINAL  Final  Culture, respiratory (non-expectorated)     Status: None   Collection Time: 11/22/19  4:59 PM   Specimen: Tracheal Aspirate; Respiratory  Result Value Ref Range Status   Specimen Description TRACHEAL ASPIRATE  Final   Special Requests NONE  Final   Gram Stain   Final    ABUNDANT WBC PRESENT, PREDOMINANTLY PMN NO ORGANISMS SEEN    Culture   Final    NO GROWTH 2 DAYS Performed at [facility], [address] [place], [place] [address]    Report Status 11/24/2019 FINAL  Final  Culture, respiratory (non-expectorated)     Status: None   Collection Time: 12/04/19  2:22 AM   Specimen: Tracheal Aspirate; Respiratory  Result Value Ref Range Status   Specimen Description TRACHEAL ASPIRATE  Final   Special Requests NONE  Final   Gram Stain   Final    ABUNDANT WBC PRESENT, PREDOMINANTLY PMN ABUNDANT GRAM NEGATIVE RODS Performed at [facility], [address] [place], [place] [address]    Culture   Final    MODERATE PSEUDOMONAS AERUGINOSA FEW ESCHERICHIA COLI Confirmed Extended Spectrum Beta-Lactamase Producer (ESBL).  In bloodstream infections from ESBL organisms, carbapenems are  preferred over piperacillin/tazobactam. They are shown to have a lower risk of mortality.    Report Status 12/09/2019 FINAL  Final   Organism ID, Bacteria PSEUDOMONAS AERUGINOSA  Final   Organism ID, Bacteria ESCHERICHIA COLI  Final      Susceptibility   Escherichia coli - MIC*    AMPICILLIN >=32 RESISTANT Resistant     CEFAZOLIN >=64 RESISTANT Resistant     CEFEPIME 2 SENSITIVE Sensitive     CEFTAZIDIME RESISTANT Resistant     CEFTRIAXONE >=64 RESISTANT Resistant     CIPROFLOXACIN <=0.25 SENSITIVE Sensitive     GENTAMICIN >=16 RESISTANT Resistant     IMIPENEM <=0.25 SENSITIVE Sensitive     TRIMETH/SULFA >=320 RESISTANT Resistant     AMPICILLIN/SULBACTAM 16 INTERMEDIATE Intermediate     PIP/TAZO <=4 SENSITIVE Sensitive     * FEW ESCHERICHIA COLI   Pseudomonas aeruginosa - MIC*    CEFTAZIDIME 16 INTERMEDIATE Intermediate     CIPROFLOXACIN >=4 RESISTANT Resistant     GENTAMICIN 2 SENSITIVE Sensitive     IMIPENEM >=16 RESISTANT Resistant     * MODERATE PSEUDOMONAS AERUGINOSA  Culture, Urine     Status: Abnormal   Collection Time: 12/04/19  6:33 PM   Specimen: Urine, Random  Result Value Ref Range Status   Specimen Description URINE, RANDOM  Final   Special Requests   Final    NONE Performed at [facility], [address] [place], [place] [address]  Culture >=100,000 COLONIES/mL PSEUDOMONAS AERUGINOSA (A)  Final   Report Status 12/06/2019 FINAL  Final   Organism ID, Bacteria PSEUDOMONAS AERUGINOSA (A)  Final      Susceptibility   Pseudomonas aeruginosa - MIC*    CEFTAZIDIME 4 SENSITIVE Sensitive     CIPROFLOXACIN >=4 RESISTANT Resistant     GENTAMICIN 4 SENSITIVE Sensitive     IMIPENEM 2 SENSITIVE Sensitive     PIP/TAZO 16 SENSITIVE Sensitive     * >=100,000 COLONIES/mL PSEUDOMONAS AERUGINOSA  Culture, blood (routine x 2)     Status: Abnormal   Collection Time: 12/11/19  9:39 AM   Specimen: BLOOD LEFT HAND  Result Value Ref Range Status   Specimen Description  BLOOD LEFT HAND  Final   Special Requests   Final    BOTTLES DRAWN AEROBIC AND ANAEROBIC Blood Culture adequate volume   Culture  Setup Time   Final    GRAM POSITIVE COCCI IN CLUSTERS ANAEROBIC BOTTLE ONLY CRITICAL RESULT CALLED TO, READ BACK BY AND VERIFIED WITH: RN R Martin [id number] AT 73 BY CM    Culture (A)  Final    STAPHYLOCOCCUS SPECIES (COAGULASE NEGATIVE) THE SIGNIFICANCE OF ISOLATING THIS ORGANISM FROM A SINGLE SET OF BLOOD CULTURES WHEN MULTIPLE SETS ARE DRAWN IS UNCERTAIN. PLEASE NOTIFY THE MICROBIOLOGY DEPARTMENT WITHIN ONE WEEK IF SPECIATION AND SENSITIVITIES ARE REQUIRED. Performed at [facility], [place] [address] [place], [place] [address]    Report Status 12/14/2019 FINAL  Final  Culture, blood (routine x 2)     Status: None (Preliminary result)   Collection Time: 12/11/19  9:47 AM   Specimen: BLOOD  Result Value Ref Range Status   Specimen Description BLOOD RIGHT ANTECUBITAL  Final   Special Requests   Final    BOTTLES DRAWN AEROBIC AND ANAEROBIC Blood Culture adequate volume   Culture  Setup Time   Final    CORRECTED RESULTS NO ORGANISMS SEEN PREVIOUSLY REPORTED AS: GRAM POSITIVE COCCI IN CLUSTERS CORRECTED RESULTS CALLED TO: RN R CUMMINGS [id number] AT 920 AM BY CM    Culture   Final    NO GROWTH 3 DAYS Performed at [facility], [place] [address], [place], [place] [address]    Report Status PENDING  Incomplete  Culture, respiratory (non-expectorated)     Status: None (Preliminary result)   Collection Time: 12/11/19 10:27 AM   Specimen: Tracheal Aspirate; Respiratory  Result Value Ref Range Status   Specimen Description TRACHEAL ASPIRATE  Final   Special Requests NONE  Final   Gram Stain NO WBC SEEN NO ORGANISMS SEEN   Final   Culture   Final    FEW PSEUDOMONAS AERUGINOSA FEW STENOTROPHOMONAS MALTOPHILIA SUSCEPTIBILITIES TO FOLLOW Performed at [facility], [place] [address], [place], [place] [address]    Report Status PENDING  Incomplete    Organism ID, Bacteria PSEUDOMONAS AERUGINOSA  Final      Susceptibility   Pseudomonas aeruginosa - MIC*    CEFTAZIDIME 16 INTERMEDIATE Intermediate     CIPROFLOXACIN >=4 RESISTANT Resistant     GENTAMICIN 2 SENSITIVE Sensitive     IMIPENEM >=16 RESISTANT Resistant     * FEW PSEUDOMONAS AERUGINOSA  Culture, Urine     Status: Abnormal   Collection Time: 12/11/19 11:30 AM   Specimen: Urine, Random  Result Value Ref Range Status   Specimen Description URINE, RANDOM  Final   Special Requests NONE  Final   Culture (A)  Final    <10,000 COLONIES/mL  INSIGNIFICANT GROWTH Performed at [facility], [place] [address] [place], [place] [address]    Report Status 12/12/2019 FINAL  Final    Coagulation Studies: No results for input(s): LABPROT, INR in the last 72 hours.  Urinalysis: No results for input(s): COLORURINE, LABSPEC, PHURINE, GLUCOSEU, HGBUR, BILIRUBINUR, KETONESUR, PROTEINUR, UROBILINOGEN, NITRITE, LEUKOCYTESUR in the last 72 hours.  Invalid input(s): APPERANCEUR    Imaging: DG Chest Port 1 View  Result Date: 12/14/2019 CLINICAL DATA:  Ventilator dependence and pneumonia. COVID-19. EXAM: PORTABLE CHEST 1 VIEW COMPARISON:  Multiple prior exams most recently yesterday. FINDINGS: Tracheostomy tube tip remains at the thoracic inlet. Heterogeneous bilateral lung opacities are not significantly changed. Dense retrocardiac consolidation with air bronchograms, possible left pleural effusion. Small right pleural effusion appears increased from yesterday. Unchanged heart size and mediastinal contours. No pneumothorax. IMPRESSION: 1. Unchanged diffuse bilateral lung opacities and dense retrocardiac opacity with air bronchograms, likely sequela of COVID pneumonia. 2. Small right pleural effusion appears increased from yesterday. 3. Tracheostomy tube tip remains at the thoracic inlet. Electronically Signed   By: Keith Rake M.D.   On: 12/14/2019 12:50      Assessment & Plan: Pt is  a 74 y.o. male with a PMHx of acute respiratory failure status post COVID-19 infection status post tracheostomy placement, atrial fibrillation, hypertension, CVA, gout, hypertrophic cardiomyopathy, who was admitted to [facility] on 11/21/2019 for ongoing treatment of acute respiratory failure.  1.  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  Patient has had an elevated gentamicin level.  Renal function significantly worse now.  Primary team to discuss the potential for renal replacement therapy with the patient's family.  If they are agreeable recommend placement of temporary dialysis catheter followed by initiation of dialysis treatment.  2.  Acute respiratory failure.  Secondary to COVID-19 infection and its sequela.  Continue mechanical ventilation at this time.  FiO2 currently 50% with a PEEP of 12.  3.  Hyperkalemia.  Serum potassium up to 5.4.  This should come down with dialysis treatment.  4.  Hyponatremia.  Likely secondary to acute kidney injury and water dysregulation.  Should partially correct with dialysis treatments.  5.  Overall prognosis guarded.

## 2019-12-15 NOTE — Procedures (Signed)
Interventional Radiology Procedure Note  Procedure: s/p RT IJ TEMP HD CATH  Complications: None  Estimated Blood Loss: MIN  Findings: TIP SVCRA

## 2019-12-15 NOTE — Progress Notes (Addendum)
Pulmonary Critical Care Medicine [place]   PULMONARY CRITICAL CARE SERVICE  PROGRESS NOTE  Date of Service: 12/15/2019  Shane Banks  SAY:[id number]  DOB: September 14, 1946   DOA: 11/21/2019  Referring Physician: [name], MD  HPI: Shane Banks is a 74 y.o. male seen for follow up of Acute on Chronic Respiratory Failure.  Patient remains on full support on the ventilator at this time assist-control mode rate of 18 with FiO2 35%.  Medications: Reviewed on Rounds  Physical Exam:  Vitals: Pulse 50 respirations 30 BP 137/68 O2 sat 99% temp 98.5  Ventilator Settings ventilator mode assist control mode rate 18 tidal volume 350 PEEP 12 and FiO2 55%  . General: Comfortable at this time . Eyes: Grossly normal lids, irises & conjunctiva . ENT: grossly tongue is normal . Neck: no obvious mass . Cardiovascular: S1 S2 normal no gallop . Respiratory: No rales or rhonchi noted . Abdomen: soft . Skin: no rash seen on limited exam . Musculoskeletal: not rigid . Psychiatric:unable to assess . Neurologic: no seizure no involuntary movements         Lab Data:   Basic Metabolic Panel: Recent Labs  Lab 12/11/19 0418 12/12/19 0411 12/13/19 0223 12/14/19 0738 12/15/19 0424  NA 147* 144 137 133* 129*  K 3.5 4.0 4.5 5.5* 5.4*  CL 105 102 97* 93* 91*  CO2 34* 33* 26 26 23   GLUCOSE 204* 243* 161* 107* 164*  BUN 31* 50* 71* 95* 107*  CREATININE 0.96 1.75* 2.16* 2.85* 3.25*  CALCIUM 8.1* 7.8* 8.0* 8.0* 7.9*  MG 2.0 2.0 1.9 2.0 2.0  PHOS  --  2.7 3.2 4.0 4.9*    ABG: Recent Labs  Lab 12/11/19 0200 12/11/19 1054  PHART 7.530* 7.395  PCO2ART 44.3 59.9*  PO2ART 74.1* 48.9*  HCO3 36.9* 35.9*  O2SAT 96.0 77.0    Liver Function Tests: Recent Labs  Lab 12/12/19 0411 12/13/19 0223 12/14/19 0738 12/15/19 0424  ALBUMIN 1.4* 1.4* 1.6* 1.5*   No results for input(s): LIPASE, AMYLASE in the last 168 hours. No results for input(s): AMMONIA in the last 168  hours.  CBC: Recent Labs  Lab 12/10/19 0557 12/12/19 0411 12/13/19 0223 12/14/19 0738 12/15/19 0424  WBC 13.5* 16.3* 18.0* 21.7* 20.1*  HGB 8.0* 6.6* 8.4* 8.7* 8.6*  HCT 25.8* 21.9* 26.9* 27.5* 26.3*  MCV 87.8 90.5 88.5 84.9 83.2  PLT 153 137* 133* 204 192    Cardiac Enzymes: No results for input(s): CKTOTAL, CKMB, CKMBINDEX, TROPONINI in the last 168 hours.  BNP (last 3 results) No results for input(s): BNP in the last 8760 hours.  ProBNP (last 3 results) No results for input(s): PROBNP in the last 8760 hours.  Radiological Exams: DG Chest Port 1 View  Result Date: 12/14/2019 CLINICAL DATA:  Ventilator dependence and pneumonia. COVID-19. EXAM: PORTABLE CHEST 1 VIEW COMPARISON:  Multiple prior exams most recently yesterday. FINDINGS: Tracheostomy tube tip remains at the thoracic inlet. Heterogeneous bilateral lung opacities are not significantly changed. Dense retrocardiac consolidation with air bronchograms, possible left pleural effusion. Small right pleural effusion appears increased from yesterday. Unchanged heart size and mediastinal contours. No pneumothorax. IMPRESSION: 1. Unchanged diffuse bilateral lung opacities and dense retrocardiac opacity with air bronchograms, likely sequela of COVID pneumonia. 2. Small right pleural effusion appears increased from yesterday. 3. Tracheostomy tube tip remains at the thoracic inlet. Electronically Signed   By: Keith Rake M.D.   On: 12/14/2019 12:50    Assessment/Plan Active Problems:   Acute  on chronic respiratory failure with hypoxia (HCC)   COVID-19 virus infection   Atrial fibrillation with RVR (HCC)   Severe sepsis (HCC)   Pneumonia due to COVID-19 virus   1. Acute on chronic respiratory failure hypoxia plan is to continue with full support on assist control right now is on 55% FiO2 good tidal volumes are noted 2. COVID-19 virus infection in resolution phase 3. Atrial fibrillation rate now rate controlled 4. Severe  sepsis hemodynamics are stable 5. Pneumonia due to COVID-19 treated we will continue to monitor   I have personally seen and evaluated the patient, evaluated laboratory and imaging results, formulated the assessment and plan and placed orders. The Patient requires high complexity decision making with multiple systems involvement.  Rounds were done with the Respiratory Therapy Director and Staff therapists and discussed with nursing staff also.  Allyne Gee, MD [facility] Pulmonary Critical Care Medicine Sleep Medicine

## 2019-12-15 NOTE — Progress Notes (Addendum)
Shane Banks  EPP:[id number] DOB: 04/02/1946 DOA: 11/21/2019  Brief Narrative:  Shane Banks is an 74 y.o. male who was admitted to [facility] on 11/21/2019.  He has a past medical history of peripheral vascular disease, hypertension, cardiomyopathy, gout.  He apparently was admitted to the acute facility for worsening shortness of breath on 10/06/2019 through 11/21/2019.  He was also previously admitted on 10/02/2019 for COVID-19 infection.  After he was discharged he apparently had worsening shortness of breath, hypoxemia.  He was also noted to have fever of 101.5.  Chest x-ray at that time was consistent with pneumonia.  He also had elevated troponin which was thought to be secondary to demand ischemia.  He initially was not given remdesivir or plasma but when he presented back the second time he was more hypoxemic and was given steroids.  Patient hospital course complicated by worsening shortness of breath leading to respiratory failure which led to intubation.  He was on the ventilator.  He had a difficult time weaning therefore underwent tracheostomy.  Per records from the outside facility on 10/22/2019 patient apparently had purple blistering on the sacral region for which the wound care team was consulted.  The wounds worsened and on 11/13/2019 he underwent debridement of the sacrum with debridement of skin, muscle, necrotic fat.  Infectious disease was also consulted.  Since admission he was on multiple antibiotics initially with ceftriaxone/azithromycin/Fortaz and then with meropenem/vancomycin.  Patient continued to have progressive increase in WBC count and new onset fevers despite being on antibiotics.  Per ID consult he was started on tigecycline for intra-abdominal coverage and empiric p.o. vancomycin.  P.o. vancomycin was later changed to Dificid on 10/24/2019 for diarrhea.  Cultures however were negative.  His hep C was also positive.  He was treated with remdesivir and convalescent  plasma.  Hospital course complicated by thrombocytopenia, acute renal failure.  Due to his worsening pressure ulcer he underwent laparoscopic diverting end colostomy on 11/16/2019. He was  treated with multiple antibiotics including IV vancomycin, azithromycin, ceftriaxone, tigecycline, Fortaz, meropenem, Flagyl and Tobra nebulizers?  His respiratory cultures from here showed Pseudomonas aeruginosa, E. coli.  He has been on treatment with ceftazidime and ciprofloxacin.  However, patient did not improve.  Repeat respiratory cultures from 12/11/2019 again showing few Pseudomonas aeruginosa, few Stenotrophomonas maltophilia.  Per the primary team over the weekend the patient crashed and had worsening respiratory failure and was on 100% FiO2.  Therefore he was given gentamicin.  Now BUN/creatinine worsening.  He also had episode of nausea and vomiting, likely aspiration.  Patient currently sedated.  On 55% FiO2.  Assessment & Plan:   Active Problems:   Acute on chronic respiratory failure with hypoxia (HCC)   COVID-19 virus infection   Atrial fibrillation with RVR (HCC)   Severe sepsis (HCC)   Pneumonia due to COVID-19 virus Pneumonia due to Pseudomonas, stenotrophomonas Sacral pressure ulcer unstageable Leukocytosis Acute renal failure Gram-positive bacteremia UTI Atrial fibrillation Diabetes mellitus type 2 Dysphagia Protein calorie malnutrition Hepatitis C  Acute on chronic hypoxemic respiratory failure: Multifactorial.  Initially started with COVID-19 infection.  However, patient subsequently had bacterial pneumonia with Pseudomonas.  He has received several antibiotics at the outside facility including Fortaz, meropenem, cefepime, tobramycin nebulizers, vancomycin, azithromycin, ceftriaxone, tigecycline.  Previously he was on treatment with vancomycin, meropenem, Flagyl.  Respiratory cultures again showing Pseudomonas and also stenotrophomonas.  He was given gentamicin over the weekend due to  worsening respiratory failure.  However, now having  acute renal failure with elevated BUN and creatinine.  Therefore would recommend to stop the gentamicin and start him on Avycaz, Levaquin which would cover for both Pseudomonas and stenotrophomonas.  Will need to request sensitivities of the Pseudomonas to [place].  Reluctant to use tobramycin nebulizers in the setting of acute renal failure.  However, if he is not improving on Avycaz and Levaquin and we have no choice but also add tobramycin nebulizers.  Also consider obtaining chest CT without contrast to better evaluate in case he is not improving.  Recent COVID-19 infection: Patient was already treated with remdesivir and convalescent plasma at the outside facility.  He was on steroids.  Continue supportive management per the primary team.  Pneumonia: His respiratory cultures at the outside facility reportedly showed Pseudomonas.  He has been treated with multiple antibiotics as mentioned above.  More recently he has been on treatment with ceftazidime and ciprofloxacin for Pseudomonas and E. coli noted on the respiratory cultures.  However, recent respiratory cultures again showing the Pseudomonas and stenotrophomonas.  He is with WBC count also is worsening and his respiratory failure is worsening.  Therefore will discontinue the ceftazidime and ciprofloxacin and start him on Avycaz and Levaquin.  As mentioned above, if he is not improving on the above regimen then we have no choice but to add tobramycin nebulizers.  UTI:  Urine cultures from 12/04/2019 showed Pseudomonas aeruginosa.  Repeat urine cultures from 12/11/2019 showing less than 10,000 colonies per mL, insignificant growth.  Antibiotics as mentioned above.  Sacral pressure ulcer unstageable: Patient has a sacral pressure ulcer that was unstageable and had surgical debridement at the outside facility.  He had debridement done on 12/01/2019. Recent pictures from 12/11/2019 showing that the wound  is worsening.   He currently has a wound VAC.  Continue local wound care.  Discussed with the primary team and they will consult surgery to evaluate the wound.  Severe sepsis: Patient was on pressors at the outside facility.  He has received several rounds of antibiotics as mentioned above.    He has received several rounds of antibiotics.  His respite status is worsening, also has elevated WBC count.  Therefore will recommend to switch to Avycaz, Levaquin.  Continue to monitor.  Gram-positive bacteremia: His blood cultures are growing coagulase-negative staph in 1 bottle.  Repeat blood cultures ordered.  He is currently on Zyvox.  If the repeat blood cultures from today do not show any growth in 48 hours would recommend to discontinue the Zyvox.  Acute renal failure: Patient had elevated BUN/creatinine.  Currently his BUN is 107, creatinine 3.25.  He is not making urine per primary team.  Nephrology has been consulted.  Plan for dialysis?  Antibiotics renally dosed.  Atrial fibrillation: Continue medications and management per primary team.  Leukocytosis: Antibiotics as mentioned above.  Continue to monitor counts.  Diabetes mellitus type 2: Continue to monitor Accu-Cheks, medications for diabetes per the primary team.  He will need proper glycemic control in order to enable wound healing.  Dysphagia: Unfortunately due to his dysphagia he is very high risk for aspiration and worsening respiratory failure secondary to aspiration pneumonia.  Protein calorie malnutrition: Management per the primary team.  Hepatitis C: Patient will need to follow-up in the outpatient clinic once his acute issues resolve.  Unfortunately due to his complex medical problems he is very high risk for worsening and decompensation.  Plan of care discussed with the primary team.  Subjective: He is on 55% FiO2.  He is sedated with propofol.  BUN/creatinine worsening.  Decreased urine  output.  Objective: Vitals: Temperature 98.5, respiratory rate 30, pulse 58, oxygen saturation 99% on 55% FiO2, PEEP of 12.  Examination: Constitutional: Ill-appearing male, sedated Eyes: PERLA  ENMT: external ears and nose appear normal, Lips appears normal, unable to evaluate the oropharynx Neck: Has trach in place CVS: S1-S2, no murmur Respiratory: Rhonchi, no wheezing Abdomen: Soft, ostomy, positive bowel sounds, PEG tube Musculoskeletal: lower extremity edema Neuro: Patient sedated, not responding, unable to do a neurologic exam at this time Psych: Unable to assess at this time Skin: Sacral pressure ulcer unstageable    Data Reviewed: I have personally reviewed following labs and imaging studies  CBC: Recent Labs  Lab 12/10/19 0557 12/12/19 0411 12/13/19 0223 12/14/19 0738 12/15/19 0424  WBC 13.5* 16.3* 18.0* 21.7* 20.1*  HGB 8.0* 6.6* 8.4* 8.7* 8.6*  HCT 25.8* 21.9* 26.9* 27.5* 26.3*  MCV 87.8 90.5 88.5 84.9 83.2  PLT 153 137* 133* 204 354   Basic Metabolic Panel: Recent Labs  Lab 12/11/19 0418 12/12/19 0411 12/13/19 0223 12/14/19 0738 12/15/19 0424  NA 147* 144 137 133* 129*  K 3.5 4.0 4.5 5.5* 5.4*  CL 105 102 97* 93* 91*  CO2 34* 33* 26 26 23   GLUCOSE 204* 243* 161* 107* 164*  BUN 31* 50* 71* 95* 107*  CREATININE 0.96 1.75* 2.16* 2.85* 3.25*  CALCIUM 8.1* 7.8* 8.0* 8.0* 7.9*  MG 2.0 2.0 1.9 2.0 2.0  PHOS  --  2.7 3.2 4.0 4.9*   GFR: CrCl cannot be calculated (Unknown ideal weight.). Liver Function Tests: Recent Labs  Lab 12/12/19 0411 12/13/19 0223 12/14/19 0738 12/15/19 0424  ALBUMIN 1.4* 1.4* 1.6* 1.5*   No results for input(s): LIPASE, AMYLASE in the last 168 hours. No results for input(s): AMMONIA in the last 168 hours. Coagulation Profile: No results for input(s): INR, PROTIME in the last 168 hours. Cardiac Enzymes: No results for input(s): CKTOTAL, CKMB, CKMBINDEX, TROPONINI in the last 168 hours. BNP (last 3 results) No results  for input(s): PROBNP in the last 8760 hours. HbA1C: No results for input(s): HGBA1C in the last 72 hours. CBG: No results for input(s): GLUCAP in the last 168 hours. Lipid Profile: No results for input(s): CHOL, HDL, LDLCALC, TRIG, CHOLHDL, LDLDIRECT in the last 72 hours. Thyroid Function Tests: No results for input(s): TSH, T4TOTAL, FREET4, T3FREE, THYROIDAB in the last 72 hours. Anemia Panel: No results for input(s): VITAMINB12, FOLATE, FERRITIN, TIBC, IRON, RETICCTPCT in the last 72 hours. Sepsis Labs: Recent Labs  Lab 12/11/19 1349 12/11/19 1349 12/12/19 1145 12/14/19 0738 12/14/19 1111 12/15/19 0424  PROCALCITON 0.85  --   --   --   --   --   LATICACIDVEN 2.0*   < > 2.1* 2.4* 2.3* 2.5*   < > = values in this interval not displayed.    Recent Results (from the past 240 hour(s))  Culture, blood (routine x 2)     Status: Abnormal   Collection Time: 12/11/19  9:39 AM   Specimen: BLOOD LEFT HAND  Result Value Ref Range Status   Specimen Description BLOOD LEFT HAND  Final   Special Requests   Final    BOTTLES DRAWN AEROBIC AND ANAEROBIC Blood Culture adequate volume   Culture  Setup Time   Final    GRAM POSITIVE COCCI IN CLUSTERS ANAEROBIC BOTTLE ONLY CRITICAL RESULT CALLED TO, READ BACK BY AND VERIFIED WITH: RN R CUMMICNGS [id number] AT 1430 BY CM  Culture (A)  Final    STAPHYLOCOCCUS SPECIES (COAGULASE NEGATIVE) THE SIGNIFICANCE OF ISOLATING THIS ORGANISM FROM A SINGLE SET OF BLOOD CULTURES WHEN MULTIPLE SETS ARE DRAWN IS UNCERTAIN. PLEASE NOTIFY THE MICROBIOLOGY DEPARTMENT WITHIN ONE WEEK IF SPECIATION AND SENSITIVITIES ARE REQUIRED. Performed at [facility], [place] [address] [place], [place] [address]    Report Status 12/14/2019 FINAL  Final  Culture, blood (routine x 2)     Status: None (Preliminary result)   Collection Time: 12/11/19  9:47 AM   Specimen: BLOOD  Result Value Ref Range Status   Specimen Description BLOOD RIGHT ANTECUBITAL  Final   Special  Requests   Final    BOTTLES DRAWN AEROBIC AND ANAEROBIC Blood Culture adequate volume   Culture  Setup Time   Final    CORRECTED RESULTS NO ORGANISMS SEEN PREVIOUSLY REPORTED AS: GRAM POSITIVE COCCI IN CLUSTERS CORRECTED RESULTS CALLED TO: RN R CUMMINGS [id number] AT 920 AM BY CM    Culture   Final    NO GROWTH 4 DAYS Performed at [facility], [place] [address] [place], [place] [address]    Report Status PENDING  Incomplete  Culture, respiratory (non-expectorated)     Status: None   Collection Time: 12/11/19 10:27 AM   Specimen: Tracheal Aspirate; Respiratory  Result Value Ref Range Status   Specimen Description TRACHEAL ASPIRATE  Final   Special Requests   Final    NONE Performed at [facility], [place] [address], [place], [place] [address]    Gram Stain NO WBC SEEN NO ORGANISMS SEEN   Final   Culture   Final    FEW PSEUDOMONAS AERUGINOSA FEW STENOTROPHOMONAS MALTOPHILIA    Report Status 12/15/2019 FINAL  Final   Organism ID, Bacteria PSEUDOMONAS AERUGINOSA  Final   Organism ID, Bacteria STENOTROPHOMONAS MALTOPHILIA  Final      Susceptibility   Pseudomonas aeruginosa - MIC*    CEFTAZIDIME 16 INTERMEDIATE Intermediate     CIPROFLOXACIN >=4 RESISTANT Resistant     GENTAMICIN 2 SENSITIVE Sensitive     IMIPENEM >=16 RESISTANT Resistant     * FEW PSEUDOMONAS AERUGINOSA   Stenotrophomonas maltophilia - MIC*    LEVOFLOXACIN 0.5 SENSITIVE Sensitive     TRIMETH/SULFA <=20 SENSITIVE Sensitive     * FEW STENOTROPHOMONAS MALTOPHILIA  Culture, Urine     Status: Abnormal   Collection Time: 12/11/19 11:30 AM   Specimen: Urine, Random  Result Value Ref Range Status   Specimen Description URINE, RANDOM  Final   Special Requests NONE  Final   Culture (A)  Final    <10,000 COLONIES/mL INSIGNIFICANT GROWTH Performed at [facility], [place] [address], [place], [place] [address]    Report Status 12/12/2019 FINAL  Final         Radiology Studies: DG Chest Port 1  View  Result Date: 12/14/2019 CLINICAL DATA:  Ventilator dependence and pneumonia. COVID-19. EXAM: PORTABLE CHEST 1 VIEW COMPARISON:  Multiple prior exams most recently yesterday. FINDINGS: Tracheostomy tube tip remains at the thoracic inlet. Heterogeneous bilateral lung opacities are not significantly changed. Dense retrocardiac consolidation with air bronchograms, possible left pleural effusion. Small right pleural effusion appears increased from yesterday. Unchanged heart size and mediastinal contours. No pneumothorax. IMPRESSION: 1. Unchanged diffuse bilateral lung opacities and dense retrocardiac opacity with air bronchograms, likely sequela of COVID pneumonia. 2. Small right pleural effusion appears increased from yesterday. 3. Tracheostomy tube tip remains at the thoracic inlet. Electronically Signed  By: Keith Rake M.D.   On: 12/14/2019 12:50        Scheduled Meds: Please see MAR  Yaakov Guthrie, MD  12/15/2019, 2:04 PM

## 2019-12-16 LAB — CBC
HCT: 24.7 % — ABNORMAL LOW (ref 39.0–52.0)
Hemoglobin: 8 g/dL — ABNORMAL LOW (ref 13.0–17.0)
MCH: 27 pg (ref 26.0–34.0)
MCHC: 32.4 g/dL (ref 30.0–36.0)
MCV: 83.4 fL (ref 80.0–100.0)
Platelets: 148 10*3/uL — ABNORMAL LOW (ref 150–400)
RBC: 2.96 MIL/uL — ABNORMAL LOW (ref 4.22–5.81)
RDW: 20.8 % — ABNORMAL HIGH (ref 11.5–15.5)
WBC: 22.7 10*3/uL — ABNORMAL HIGH (ref 4.0–10.5)
nRBC: 0.1 % (ref 0.0–0.2)

## 2019-12-16 LAB — MAGNESIUM: Magnesium: 2 mg/dL (ref 1.7–2.4)

## 2019-12-16 LAB — CULTURE, BLOOD (ROUTINE X 2)
Culture: NO GROWTH
Special Requests: ADEQUATE

## 2019-12-16 LAB — HEPATITIS B SURFACE ANTIBODY,QUALITATIVE: Hep B S Ab: REACTIVE — AB

## 2019-12-16 LAB — RENAL FUNCTION PANEL
Albumin: 1.4 g/dL — ABNORMAL LOW (ref 3.5–5.0)
Anion gap: 12 (ref 5–15)
BUN: 101 mg/dL — ABNORMAL HIGH (ref 8–23)
CO2: 25 mmol/L (ref 22–32)
Calcium: 7.6 mg/dL — ABNORMAL LOW (ref 8.9–10.3)
Chloride: 92 mmol/L — ABNORMAL LOW (ref 98–111)
Creatinine, Ser: 3.08 mg/dL — ABNORMAL HIGH (ref 0.61–1.24)
GFR calc Af Amer: 22 mL/min — ABNORMAL LOW (ref >60)
GFR calc non Af Amer: 19 mL/min — ABNORMAL LOW (ref >60)
Glucose, Bld: 146 mg/dL — ABNORMAL HIGH (ref 70–99)
Phosphorus: 4.3 mg/dL (ref 2.5–4.6)
Potassium: 4.5 mmol/L (ref 3.5–5.1)
Sodium: 129 mmol/L — ABNORMAL LOW (ref 135–145)

## 2019-12-16 LAB — LACTIC ACID, PLASMA: Lactic Acid, Venous: 1.6 mmol/L (ref 0.5–1.9)

## 2019-12-17 ENCOUNTER — Other Ambulatory Visit (HOSPITAL_COMMUNITY): Payer: Medicare HMO

## 2019-12-17 DIAGNOSIS — I4891 Unspecified atrial fibrillation: Secondary | ICD-10-CM | POA: Diagnosis not present

## 2019-12-17 DIAGNOSIS — J9621 Acute and chronic respiratory failure with hypoxia: Secondary | ICD-10-CM | POA: Diagnosis not present

## 2019-12-17 DIAGNOSIS — U071 COVID-19: Secondary | ICD-10-CM | POA: Diagnosis not present

## 2019-12-17 DIAGNOSIS — N179 Acute kidney failure, unspecified: Secondary | ICD-10-CM | POA: Diagnosis not present

## 2019-12-17 LAB — BLOOD GAS, ARTERIAL
Acid-Base Excess: 0.5 mmol/L (ref 0.0–2.0)
Acid-base deficit: 0.4 mmol/L (ref 0.0–2.0)
Bicarbonate: 25.4 mmol/L (ref 20.0–28.0)
Bicarbonate: 24.2 mmol/L (ref 20.0–28.0)
Drawn by: 243969
FIO2: 50
FIO2: 80
O2 Saturation: 96.3 %
O2 Saturation: 94 %
Patient temperature: 37
Patient temperature: 37
pCO2 arterial: 46.4 mmHg (ref 32.0–48.0)
pCO2 arterial: 42.9 mmHg (ref 32.0–48.0)
pH, Arterial: 7.357 (ref 7.350–7.450)
pH, Arterial: 7.37 (ref 7.350–7.450)
pO2, Arterial: 86.7 mmHg (ref 83.0–108.0)
pO2, Arterial: 73 mmHg — ABNORMAL LOW (ref 83.0–108.0)

## 2019-12-17 LAB — RENAL FUNCTION PANEL
Albumin: 1.5 g/dL — ABNORMAL LOW (ref 3.5–5.0)
Anion gap: 14 (ref 5–15)
BUN: 113 mg/dL — ABNORMAL HIGH (ref 8–23)
CO2: 23 mmol/L (ref 22–32)
Calcium: 7.9 mg/dL — ABNORMAL LOW (ref 8.9–10.3)
Chloride: 89 mmol/L — ABNORMAL LOW (ref 98–111)
Creatinine, Ser: 3.35 mg/dL — ABNORMAL HIGH (ref 0.61–1.24)
GFR calc Af Amer: 20 mL/min — ABNORMAL LOW (ref >60)
GFR calc non Af Amer: 17 mL/min — ABNORMAL LOW (ref >60)
Glucose, Bld: 187 mg/dL — ABNORMAL HIGH (ref 70–99)
Phosphorus: 5.3 mg/dL — ABNORMAL HIGH (ref 2.5–4.6)
Potassium: 4.8 mmol/L (ref 3.5–5.1)
Sodium: 126 mmol/L — ABNORMAL LOW (ref 135–145)

## 2019-12-17 LAB — PTH, INTACT AND CALCIUM
Calcium, Total (PTH): 7.5 mg/dL — ABNORMAL LOW (ref 8.6–10.2)
PTH: 245 pg/mL — ABNORMAL HIGH (ref 15–65)

## 2019-12-17 LAB — CBC
HCT: 24.6 % — ABNORMAL LOW (ref 39.0–52.0)
Hemoglobin: 8.1 g/dL — ABNORMAL LOW (ref 13.0–17.0)
MCH: 27.7 pg (ref 26.0–34.0)
MCHC: 32.9 g/dL (ref 30.0–36.0)
MCV: 84.2 fL (ref 80.0–100.0)
Platelets: 142 10*3/uL — ABNORMAL LOW (ref 150–400)
RBC: 2.92 MIL/uL — ABNORMAL LOW (ref 4.22–5.81)
RDW: 21 % — ABNORMAL HIGH (ref 11.5–15.5)
WBC: 22.2 10*3/uL — ABNORMAL HIGH (ref 4.0–10.5)
nRBC: 0.1 % (ref 0.0–0.2)

## 2019-12-17 LAB — LACTIC ACID, PLASMA: Lactic Acid, Venous: 1.3 mmol/L (ref 0.5–1.9)

## 2019-12-17 LAB — C DIFFICILE QUICK SCREEN W PCR REFLEX
C Diff antigen: NEGATIVE
C Diff interpretation: NOT DETECTED
C Diff toxin: NEGATIVE

## 2019-12-17 LAB — MAGNESIUM: Magnesium: 1.9 mg/dL (ref 1.7–2.4)

## 2019-12-17 NOTE — Progress Notes (Addendum)
Pulmonary Critical Care Medicine [place]   PULMONARY CRITICAL CARE SERVICE  PROGRESS NOTE  Date of Service: 12/17/2019  Shane Banks  CHY:[id number]  DOB: 1946-05-27   DOA: 11/21/2019  Referring Physician: [name], MD  HPI: Shane Banks is a 74 y.o. male seen for follow up of Acute on Chronic Respiratory Failure.  Patient is on full support ventilator at this time assist-control mode rate of 18 with FiO2 of 50% has a large amount of thick secretions.  No fever or distress noted.  Medications: Reviewed on Rounds  Physical Exam:  Vitals: Pulse 57 respirations 29 BP 171/82 O2 sat 99% temp 97.7  Ventilator Settings ventilator mode AC VC plus rate of 18 tidal line 350 PEEP of 12 and FiO2 of 50%  . General: Comfortable at this time . Eyes: Grossly normal lids, irises & conjunctiva . ENT: grossly tongue is normal . Neck: no obvious mass . Cardiovascular: S1 S2 normal no gallop . Respiratory: No rales or rhonchi noted . Abdomen: soft . Skin: no rash seen on limited exam . Musculoskeletal: not rigid . Psychiatric:unable to assess . Neurologic: no seizure no involuntary movements         Lab Data:   Basic Metabolic Panel: Recent Labs  Lab 12/13/19 0223 12/13/19 0223 12/14/19 0738 12/14/19 0738 12/15/19 0424 12/15/19 2026 12/16/19 0803 12/17/19 0515  NA 137   < > 133*  --  129* 127* 129* 126*  K 4.5   < > 5.5*  --  5.4* 5.0 4.5 4.8  CL 97*   < > 93*  --  91* 90* 92* 89*  CO2 26   < > 26  --  23 22 25 23   GLUCOSE 161*   < > 107*  --  164* 88 146* 187*  BUN 71*   < > 95*  --  107* 123* 101* 113*  CREATININE 2.16*   < > 2.85*  --  3.25* 3.48* 3.08* 3.35*  CALCIUM 8.0*   < > 8.0*   < > 7.9* 7.5*  7.5* 7.6* 7.9*  MG 1.9  --  2.0  --  2.0  --  2.0 1.9  PHOS 3.2   < > 4.0  --  4.9* 4.8* 4.3 5.3*   < > = values in this interval not displayed.    ABG: Recent Labs  Lab 12/11/19 0200 12/11/19 1054 12/15/19 1325 12/17/19 0643 12/17/19 1809   PHART 7.530* 7.395 7.387 7.357 7.370  PCO2ART 44.3 59.9* 43.6 46.4 42.9  PO2ART 74.1* 48.9* 123* 86.7 73.0*  HCO3 36.9* 35.9* 25.5 25.4 24.2  O2SAT 96.0 77.0 98.6 96.3 94.0    Liver Function Tests: Recent Labs  Lab 12/14/19 0738 12/15/19 0424 12/15/19 2026 12/16/19 0803 12/17/19 0515  ALBUMIN 1.6* 1.5* 1.4* 1.4* 1.5*   No results for input(s): LIPASE, AMYLASE in the last 168 hours. No results for input(s): AMMONIA in the last 168 hours.  CBC: Recent Labs  Lab 12/14/19 0738 12/15/19 0424 12/15/19 2026 12/16/19 0803 12/17/19 0515  WBC 21.7* 20.1* 21.2* 22.7* 22.2*  HGB 8.7* 8.6* 7.8* 8.0* 8.1*  HCT 27.5* 26.3* 24.2* 24.7* 24.6*  MCV 84.9 83.2 85.5 83.4 84.2  PLT 204 192 154 148* 142*    Cardiac Enzymes: No results for input(s): CKTOTAL, CKMB, CKMBINDEX, TROPONINI in the last 168 hours.  BNP (last 3 results) No results for input(s): BNP in the last 8760 hours.  ProBNP (last 3 results) No results for input(s): PROBNP in the last  8760 hours.  Radiological Exams: DG Chest Port 1 View  Result Date: 12/17/2019 CLINICAL DATA:  Pneumonia. EXAM: PORTABLE CHEST 1 VIEW COMPARISON:  March 18th 2021. FINDINGS: Stable cardiomediastinal silhouette. Tracheostomy tube is unchanged in position. Interval placement of right internal jugular catheter with distal tip in expected position of right atrium. Stable bilateral lung opacities are noted concerning for edema or pneumonia with associated pleural effusions. No pneumothorax is noted. Bony thorax is unremarkable. IMPRESSION: Interval placement of right internal jugular catheter with distal tip in expected position of right atrium. Stable bilateral lung opacities as described above. Electronically Signed   By: Marijo Conception M.D.   On: 12/17/2019 08:33    Assessment/Plan Active Problems:   Acute on chronic respiratory failure with hypoxia (HCC)   COVID-19 virus infection   Atrial fibrillation with RVR (HCC)   Severe sepsis  (HCC)   Pneumonia due to COVID-19 virus   1. Acute on chronic respiratory failure hypoxia plan is to continue with full support on assist control right now is on 50% FiO2 good tidal volumes are noted 2. COVID-19 virus infection in resolution phase 3. Atrial fibrillation rate now rate controlled 4. Severe sepsis hemodynamics are stable 5. Pneumonia due to COVID-19 treated we will continue to monitor   I have personally seen and evaluated the patient, evaluated laboratory and imaging results, formulated the assessment and plan and placed orders. The Patient requires high complexity decision making with multiple systems involvement.  Rounds were done with the Respiratory Therapy Director and Staff therapists and discussed with nursing staff also.  Allyne Gee, MD [facility] Pulmonary Critical Care Medicine Sleep Medicine

## 2019-12-18 DIAGNOSIS — I4891 Unspecified atrial fibrillation: Secondary | ICD-10-CM | POA: Diagnosis not present

## 2019-12-18 DIAGNOSIS — J9621 Acute and chronic respiratory failure with hypoxia: Secondary | ICD-10-CM | POA: Diagnosis not present

## 2019-12-18 DIAGNOSIS — N179 Acute kidney failure, unspecified: Secondary | ICD-10-CM | POA: Diagnosis not present

## 2019-12-18 DIAGNOSIS — U071 COVID-19: Secondary | ICD-10-CM | POA: Diagnosis not present

## 2019-12-18 LAB — RENAL FUNCTION PANEL
Albumin: 1.7 g/dL — ABNORMAL LOW (ref 3.5–5.0)
Anion gap: 16 — ABNORMAL HIGH (ref 5–15)
BUN: 134 mg/dL — ABNORMAL HIGH (ref 8–23)
CO2: 21 mmol/L — ABNORMAL LOW (ref 22–32)
Calcium: 8.2 mg/dL — ABNORMAL LOW (ref 8.9–10.3)
Chloride: 88 mmol/L — ABNORMAL LOW (ref 98–111)
Creatinine, Ser: 3.82 mg/dL — ABNORMAL HIGH (ref 0.61–1.24)
GFR calc Af Amer: 17 mL/min — ABNORMAL LOW (ref >60)
GFR calc non Af Amer: 15 mL/min — ABNORMAL LOW (ref >60)
Glucose, Bld: 144 mg/dL — ABNORMAL HIGH (ref 70–99)
Phosphorus: 5.3 mg/dL — ABNORMAL HIGH (ref 2.5–4.6)
Potassium: 5 mmol/L (ref 3.5–5.1)
Sodium: 125 mmol/L — ABNORMAL LOW (ref 135–145)

## 2019-12-18 LAB — CBC
HCT: 22.7 % — ABNORMAL LOW (ref 39.0–52.0)
Hemoglobin: 7.5 g/dL — ABNORMAL LOW (ref 13.0–17.0)
MCH: 27.8 pg (ref 26.0–34.0)
MCHC: 33 g/dL (ref 30.0–36.0)
MCV: 84.1 fL (ref 80.0–100.0)
Platelets: 143 10*3/uL — ABNORMAL LOW (ref 150–400)
RBC: 2.7 MIL/uL — ABNORMAL LOW (ref 4.22–5.81)
RDW: 20.6 % — ABNORMAL HIGH (ref 11.5–15.5)
WBC: 30.4 10*3/uL — ABNORMAL HIGH (ref 4.0–10.5)
nRBC: 0 % (ref 0.0–0.2)

## 2019-12-18 LAB — MAGNESIUM: Magnesium: 2 mg/dL (ref 1.7–2.4)

## 2019-12-18 NOTE — Progress Notes (Signed)
[facility]  ROUNDING NOTE   Subjective:  Patient remains critically ill at the moment. Still has significant volume overload. Still on the ventilator.    Objective:  Vital signs in last 24 hours:  Temperature 99.9 pulse 66 respirations 36 blood pressure 183/83  Physical Exam: General: Critically ill-appearing  Head: Normocephalic, atraumatic. Moist oral mucosal membranes  Eyes: Anicteric  Neck: Tracheostomy in place  Lungs:  Scattered rhonchi, vent assisted  Heart: S1S2 no rubs  Abdomen:  Soft, nontender, bowel sounds present, PEG tube in place  Extremities: 3+ peripheral edema.  Neurologic: Not following commands  Skin: No rash  Access: Right IJ temporary dialysis catheter    Basic Metabolic Panel: Recent Labs  Lab 12/14/19 0738 12/14/19 1610 12/15/19 0424 12/15/19 0424 12/15/19 2026 12/15/19 2026 12/16/19 0803 12/17/19 0515 12/18/19 0627  NA 133*   < > 129*  --  127*  --  129* 126* 125*  K 5.5*   < > 5.4*  --  5.0  --  4.5 4.8 5.0  CL 93*   < > 91*  --  90*  --  92* 89* 88*  CO2 26   < > 23  --  22  --  25 23 21*  GLUCOSE 107*   < > 164*  --  88  --  146* 187* 144*  BUN 95*   < > 107*  --  123*  --  101* 113* 134*  CREATININE 2.85*   < > 3.25*  --  3.48*  --  3.08* 3.35* 3.82*  CALCIUM 8.0*   < > 7.9*   < > 7.5*  7.5*   < > 7.6* 7.9* 8.2*  MG 2.0  --  2.0  --   --   --  2.0 1.9 2.0  PHOS 4.0   < > 4.9*  --  4.8*  --  4.3 5.3* 5.3*   < > = values in this interval not displayed.    Liver Function Tests: Recent Labs  Lab 12/15/19 0424 12/15/19 2026 12/16/19 0803 12/17/19 0515 12/18/19 9604  ALBUMIN 1.5* 1.4* 1.4* 1.5* 1.7*   No results for input(s): LIPASE, AMYLASE in the last 168 hours. No results for input(s): AMMONIA in the last 168 hours.  CBC: Recent Labs  Lab 12/15/19 0424 12/15/19 2026 12/16/19 0803 12/17/19 0515 12/18/19 0627  WBC 20.1* 21.2* 22.7* 22.2* 30.4*  HGB 8.6* 7.8* 8.0* 8.1* 7.5*  HCT 26.3* 24.2* 24.7* 24.6*  22.7*  MCV 83.2 85.5 83.4 84.2 84.1  PLT 192 154 148* 142* 143*    Cardiac Enzymes: No results for input(s): CKTOTAL, CKMB, CKMBINDEX, TROPONINI in the last 168 hours.  BNP: Invalid input(s): POCBNP  CBG: No results for input(s): GLUCAP in the last 168 hours.  Microbiology: Results for orders placed or performed during the hospital encounter of 11/21/19  Culture, Urine     Status: Abnormal   Collection Time: 11/22/19  1:25 PM   Specimen: Urine, Random  Result Value Ref Range Status   Specimen Description URINE, RANDOM  Final   Special Requests   Final    NONE Performed at [facility], [address], [place], [place] [address]    Culture 50,000 COLONIES/mL YEAST (A)  Final   Report Status 11/23/2019 FINAL  Final  Culture, blood (routine x 2)     Status: None   Collection Time: 11/22/19  1:40 PM   Specimen: BLOOD LEFT HAND  Result Value Ref Range Status  Specimen Description BLOOD LEFT HAND  Final   Special Requests   Final    BOTTLES DRAWN AEROBIC ONLY Blood Culture results may not be optimal due to an inadequate volume of blood received in culture bottles   Culture   Final    NO GROWTH 5 DAYS Performed at [facility], [place] [address] [place], [place] [address]    Report Status 11/27/2019 FINAL  Final  Culture, blood (routine x 2)     Status: None   Collection Time: 11/22/19  1:44 PM   Specimen: BLOOD LEFT HAND  Result Value Ref Range Status   Specimen Description BLOOD LEFT HAND  Final   Special Requests   Final    BOTTLES DRAWN AEROBIC ONLY Blood Culture results may not be optimal due to an inadequate volume of blood received in culture bottles   Culture   Final    NO GROWTH 5 DAYS Performed at [facility], [place] [address] [place], [place] [address]    Report Status 11/27/2019 FINAL  Final  Culture, respiratory (non-expectorated)     Status: None   Collection Time: 11/22/19  4:59 PM   Specimen: Tracheal Aspirate; Respiratory  Result Value  Ref Range Status   Specimen Description TRACHEAL ASPIRATE  Final   Special Requests NONE  Final   Gram Stain   Final    ABUNDANT WBC PRESENT, PREDOMINANTLY PMN NO ORGANISMS SEEN    Culture   Final    NO GROWTH 2 DAYS Performed at [facility], [address], [place], [place] [address]    Report Status 11/24/2019 FINAL  Final  Culture, respiratory (non-expectorated)     Status: None   Collection Time: 12/04/19  2:22 AM   Specimen: Tracheal Aspirate; Respiratory  Result Value Ref Range Status   Specimen Description TRACHEAL ASPIRATE  Final   Special Requests NONE  Final   Gram Stain   Final    ABUNDANT WBC PRESENT, PREDOMINANTLY PMN ABUNDANT GRAM NEGATIVE RODS Performed at [facility], [address] [place], [place] [address]    Culture   Final    MODERATE PSEUDOMONAS AERUGINOSA FEW ESCHERICHIA COLI Confirmed Extended Spectrum Beta-Lactamase Producer (ESBL).  In bloodstream infections from ESBL organisms, carbapenems are preferred over piperacillin/tazobactam. They are shown to have a lower risk of mortality.    Report Status 12/09/2019 FINAL  Final   Organism ID, Bacteria PSEUDOMONAS AERUGINOSA  Final   Organism ID, Bacteria ESCHERICHIA COLI  Final      Susceptibility   Escherichia coli - MIC*    AMPICILLIN >=32 RESISTANT Resistant     CEFAZOLIN >=64 RESISTANT Resistant     CEFEPIME 2 SENSITIVE Sensitive     CEFTAZIDIME RESISTANT Resistant     CEFTRIAXONE >=64 RESISTANT Resistant     CIPROFLOXACIN <=0.25 SENSITIVE Sensitive     GENTAMICIN >=16 RESISTANT Resistant     IMIPENEM <=0.25 SENSITIVE Sensitive     TRIMETH/SULFA >=320 RESISTANT Resistant     AMPICILLIN/SULBACTAM 16 INTERMEDIATE Intermediate     PIP/TAZO <=4 SENSITIVE Sensitive     * FEW ESCHERICHIA COLI   Pseudomonas aeruginosa - MIC*    CEFTAZIDIME 16 INTERMEDIATE Intermediate     CIPROFLOXACIN >=4 RESISTANT Resistant     GENTAMICIN 2 SENSITIVE Sensitive     IMIPENEM >=16 RESISTANT Resistant      * MODERATE PSEUDOMONAS AERUGINOSA  Culture, Urine     Status: Abnormal   Collection Time: 12/04/19  6:33 PM  Specimen: Urine, Random  Result Value Ref Range Status   Specimen Description URINE, RANDOM  Final   Special Requests   Final    NONE Performed at [facility], [address] [place], [place] [address]    Culture >=100,000 COLONIES/mL PSEUDOMONAS AERUGINOSA (A)  Final   Report Status 12/06/2019 FINAL  Final   Organism ID, Bacteria PSEUDOMONAS AERUGINOSA (A)  Final      Susceptibility   Pseudomonas aeruginosa - MIC*    CEFTAZIDIME 4 SENSITIVE Sensitive     CIPROFLOXACIN >=4 RESISTANT Resistant     GENTAMICIN 4 SENSITIVE Sensitive     IMIPENEM 2 SENSITIVE Sensitive     PIP/TAZO 16 SENSITIVE Sensitive     * >=100,000 COLONIES/mL PSEUDOMONAS AERUGINOSA  Culture, blood (routine x 2)     Status: Abnormal   Collection Time: 12/11/19  9:39 AM   Specimen: BLOOD LEFT HAND  Result Value Ref Range Status   Specimen Description BLOOD LEFT HAND  Final   Special Requests   Final    BOTTLES DRAWN AEROBIC AND ANAEROBIC Blood Culture adequate volume   Culture  Setup Time   Final    GRAM POSITIVE COCCI IN CLUSTERS ANAEROBIC BOTTLE ONLY CRITICAL RESULT CALLED TO, READ BACK BY AND VERIFIED WITH: RN R CUMMICNGS [id number] AT 52 BY CM    Culture (A)  Final    STAPHYLOCOCCUS SPECIES (COAGULASE NEGATIVE) THE SIGNIFICANCE OF ISOLATING THIS ORGANISM FROM A SINGLE SET OF BLOOD CULTURES WHEN MULTIPLE SETS ARE DRAWN IS UNCERTAIN. PLEASE NOTIFY THE MICROBIOLOGY DEPARTMENT WITHIN ONE WEEK IF SPECIATION AND SENSITIVITIES ARE REQUIRED. Performed at [facility], [place] [address], [place], [place] [address]    Report Status 12/14/2019 FINAL  Final  Culture, blood (routine x 2)     Status: None   Collection Time: 12/11/19  9:47 AM   Specimen: BLOOD  Result Value Ref Range Status   Specimen Description BLOOD RIGHT ANTECUBITAL  Final   Special Requests   Final    BOTTLES DRAWN AEROBIC  AND ANAEROBIC Blood Culture adequate volume   Culture  Setup Time   Final    CORRECTED RESULTS NO ORGANISMS SEEN PREVIOUSLY REPORTED AS: GRAM POSITIVE COCCI IN CLUSTERS CORRECTED RESULTS CALLED TO: RN R CUMMINGS [id number] AT 920 AM BY CM    Culture   Final    NO GROWTH 5 DAYS Performed at [facility], [place] [address], [place], [place] [address]    Report Status 12/16/2019 FINAL  Final  Culture, respiratory (non-expectorated)     Status: None (Preliminary result)   Collection Time: 12/11/19 10:27 AM   Specimen: Tracheal Aspirate; Respiratory  Result Value Ref Range Status   Specimen Description TRACHEAL ASPIRATE  Final   Special Requests   Final    NONE Performed at [facility], [place] [address], [place], [place] [address]    Gram Stain NO WBC SEEN NO ORGANISMS SEEN   Final   Culture   Final    FEW PSEUDOMONAS AERUGINOSA FEW STENOTROPHOMONAS MALTOPHILIA PSEUDOMONAS AERUGINOSA Sent to [facility] for further susceptibility testing.    Report Status PENDING  Incomplete   Organism ID, Bacteria PSEUDOMONAS AERUGINOSA  Final   Organism ID, Bacteria STENOTROPHOMONAS MALTOPHILIA  Final      Susceptibility   Pseudomonas aeruginosa - MIC*    CEFTAZIDIME 16 INTERMEDIATE Intermediate     CIPROFLOXACIN >=4 RESISTANT Resistant     GENTAMICIN 2 SENSITIVE Sensitive     IMIPENEM >=16 RESISTANT  Resistant     * FEW PSEUDOMONAS AERUGINOSA   Stenotrophomonas maltophilia - MIC*    LEVOFLOXACIN 0.5 SENSITIVE Sensitive     TRIMETH/SULFA <=20 SENSITIVE Sensitive     * FEW STENOTROPHOMONAS MALTOPHILIA  Culture, Urine     Status: Abnormal   Collection Time: 12/11/19 11:30 AM   Specimen: Urine, Random  Result Value Ref Range Status   Specimen Description URINE, RANDOM  Final   Special Requests NONE  Final   Culture (A)  Final    <10,000 COLONIES/mL INSIGNIFICANT GROWTH Performed at [facility], [place] [address], [place], [place] [address]    Report Status 12/12/2019 FINAL  Final   Culture, blood (routine x 2)     Status: None (Preliminary result)   Collection Time: 12/15/19 12:37 PM   Specimen: BLOOD LEFT HAND  Result Value Ref Range Status   Specimen Description BLOOD LEFT HAND  Final   Special Requests   Final    BOTTLES DRAWN AEROBIC AND ANAEROBIC Blood Culture adequate volume   Culture   Final    NO GROWTH 2 DAYS Performed at [facility], [place] [facility] [place] [address], [place], [place] [address]    Report Status PENDING  Incomplete  Culture, blood (routine x 2)     Status: None (Preliminary result)   Collection Time: 12/15/19 12:42 PM   Specimen: BLOOD LEFT HAND  Result Value Ref Range Status   Specimen Description BLOOD LEFT HAND  Final   Special Requests   Final    BOTTLES DRAWN AEROBIC AND ANAEROBIC Blood Culture adequate volume   Culture   Final    NO GROWTH 2 DAYS Performed at [facility], [place] [address] [place], [place] [address]    Report Status PENDING  Incomplete  C Difficile Quick Screen w PCR reflex     Status: None   Collection Time: 12/17/19  7:00 PM  Result Value Ref Range Status   C Diff antigen NEGATIVE NEGATIVE Final   C Diff toxin NEGATIVE NEGATIVE Final   C Diff interpretation No C. difficile detected.  Final    Comment: Performed at [facility], [place] [address], [place], [place] [address]    Coagulation Studies: No results for input(s): LABPROT, INR in the last 72 hours.  Urinalysis: No results for input(s): COLORURINE, LABSPEC, PHURINE, GLUCOSEU, HGBUR, BILIRUBINUR, KETONESUR, PROTEINUR, UROBILINOGEN, NITRITE, LEUKOCYTESUR in the last 72 hours.  Invalid input(s): APPERANCEUR    Imaging: DG Chest Port 1 View  Result Date: 12/17/2019 CLINICAL DATA:  Pneumonia. EXAM: PORTABLE CHEST 1 VIEW COMPARISON:  March 18th 2021. FINDINGS: Stable cardiomediastinal silhouette. Tracheostomy tube is unchanged in position. Interval placement of right internal jugular catheter with distal tip in expected position of right atrium.  Stable bilateral lung opacities are noted concerning for edema or pneumonia with associated pleural effusions. No pneumothorax is noted. Bony thorax is unremarkable. IMPRESSION: Interval placement of right internal jugular catheter with distal tip in expected position of right atrium. Stable bilateral lung opacities as described above. Electronically Signed   By: Marijo Conception M.D.   On: 12/17/2019 08:33     Medications:     heparin, lidocaine  Assessment/ Plan:  74 y.o. male with a PMHx of acute respiratory failure status post COVID-19 infection status post tracheostomy placement, atrial fibrillation, hypertension, CVA, gout, hypertrophic cardiomyopathy, who was admitted to [facility] on 11/21/2019 for ongoing treatment of acute respiratory failure.  1.  Acute kidney injury suspect secondary  to gentamicin toxicity.  Patient remains significantly volume overloaded.  Urine output only 100 cc over the preceding 24 hours.  We will plan for second dialysis treatment today.  Thereafter we will plan third treatment on Wednesday.  2.  Acute respiratory failure secondary to COVID-19 infection and its sequela.  Maintain the patient on mechanical ventilation.  3.  Hyperkalemia.  Serum potassium down to 5.0.  Should come down further with dialysis treatment today.  4.  Hyponatremia.  Serum sodium 125.  This should hopefully begin to improve as we perform further dialysis treatments.    LOS: 0 Shane Banks 3/22/20218:07 AM

## 2019-12-18 NOTE — Progress Notes (Addendum)
Pulmonary Critical Care Medicine [name]   PULMONARY CRITICAL CARE SERVICE  PROGRESS NOTE  Date of Service: 12/18/2019  Shane Banks  VWU:[id number]  DOB: 09-Jan-1946   DOA: 11/21/2019  Referring Physician: [name], MD  HPI: Shane Banks is a 74 y.o. male seen for follow up of Acute on Chronic Respiratory Failure.  Patient continues on full support current rate of 18 with an FiO2 of 60% satting well no distress.  Medications: Reviewed on Rounds  Physical Exam:  Vitals: Pulse 66 respirations 36 BP 183/85 O2 sat 100% temp 99.9  Ventilator Settings ventilator mode AC VC plus rate of 18 tidal volume 350 PEEP of 12 and FiO2 60%  . General: Comfortable at this time . Eyes: Grossly normal lids, irises & conjunctiva . ENT: grossly tongue is normal . Neck: no obvious mass . Cardiovascular: S1 S2 normal no gallop . Respiratory: No rales or rhonchi noted . Abdomen: soft . Skin: no rash seen on limited exam . Musculoskeletal: not rigid . Psychiatric:unable to assess . Neurologic: no seizure no involuntary movements         Lab Data:   Basic Metabolic Panel: Recent Labs  Lab 12/14/19 0738 12/14/19 0738 12/15/19 0424 12/15/19 2026 12/16/19 0803 12/17/19 0515 12/18/19 0627  NA 133*   < > 129* 127* 129* 126* 125*  K 5.5*   < > 5.4* 5.0 4.5 4.8 5.0  CL 93*   < > 91* 90* 92* 89* 88*  CO2 26   < > 23 22 25 23  21*  GLUCOSE 107*   < > 164* 88 146* 187* 144*  BUN 95*   < > 107* 123* 101* 113* 134*  CREATININE 2.85*   < > 3.25* 3.48* 3.08* 3.35* 3.82*  CALCIUM 8.0*   < > 7.9* 7.5*  7.5* 7.6* 7.9* 8.2*  MG 2.0  --  2.0  --  2.0 1.9 2.0  PHOS 4.0   < > 4.9* 4.8* 4.3 5.3* 5.3*   < > = values in this interval not displayed.    ABG: Recent Labs  Lab 12/15/19 1325 12/17/19 0643 12/17/19 1809  PHART 7.387 7.357 7.370  PCO2ART 43.6 46.4 42.9  PO2ART 123* 86.7 73.0*  HCO3 25.5 25.4 24.2  O2SAT 98.6 96.3 94.0    Liver Function Tests: Recent Labs   Lab 12/15/19 0424 12/15/19 2026 12/16/19 0803 12/17/19 0515 12/18/19 0627  ALBUMIN 1.5* 1.4* 1.4* 1.5* 1.7*   No results for input(s): LIPASE, AMYLASE in the last 168 hours. No results for input(s): AMMONIA in the last 168 hours.  CBC: Recent Labs  Lab 12/15/19 0424 12/15/19 2026 12/16/19 0803 12/17/19 0515 12/18/19 0627  WBC 20.1* 21.2* 22.7* 22.2* 30.4*  HGB 8.6* 7.8* 8.0* 8.1* 7.5*  HCT 26.3* 24.2* 24.7* 24.6* 22.7*  MCV 83.2 85.5 83.4 84.2 84.1  PLT 192 154 148* 142* 143*    Cardiac Enzymes: No results for input(s): CKTOTAL, CKMB, CKMBINDEX, TROPONINI in the last 168 hours.  BNP (last 3 results) No results for input(s): BNP in the last 8760 hours.  ProBNP (last 3 results) No results for input(s): PROBNP in the last 8760 hours.  Radiological Exams: DG Chest Port 1 View  Result Date: 12/17/2019 CLINICAL DATA:  Pneumonia. EXAM: PORTABLE CHEST 1 VIEW COMPARISON:  March 18th 2021. FINDINGS: Stable cardiomediastinal silhouette. Tracheostomy tube is unchanged in position. Interval placement of right internal jugular catheter with distal tip in expected position of right atrium. Stable bilateral lung opacities are noted concerning  for edema or pneumonia with associated pleural effusions. No pneumothorax is noted. Bony thorax is unremarkable. IMPRESSION: Interval placement of right internal jugular catheter with distal tip in expected position of right atrium. Stable bilateral lung opacities as described above. Electronically Signed   By: Marijo Conception M.D.   On: 12/17/2019 08:33    Assessment/Plan Active Problems:   Acute on chronic respiratory failure with hypoxia (HCC)   COVID-19 virus infection   Atrial fibrillation with RVR (HCC)   Severe sepsis (HCC)   Pneumonia due to COVID-19 virus   1. Acute on chronic respiratory failure hypoxia plan is to continue with full support on assist control right now is on60% FiO2 good tidal volumes are noted 2. COVID-19 virus  infection in resolution phase 3. Atrial fibrillation rate now rate controlled 4. Severe sepsis hemodynamics are stable 5. Pneumonia due to COVID-19 treated we will continue to monitor   I have personally seen and evaluated the patient, evaluated laboratory and imaging results, formulated the assessment and plan and placed orders. The Patient requires high complexity decision making with multiple systems involvement.  Rounds were done with the Respiratory Therapy Director and Staff therapists and discussed with nursing staff also.  Allyne Gee, MD [facility] Pulmonary Critical Care Medicine Sleep Medicine

## 2019-12-18 NOTE — Consult Note (Addendum)
Referring Physician: Dr. Porfirio Mylar Shane Banks is an 74 y.o. male.                       Chief Complaint: Bradycardia  HPI: 74 years old black male is admitted with acute on chronic respiratory failure with hypoxemia has PMH of Hypertension, Stroke, Hypertrophic cardiomyopathy, COIVD pneumonia, Sepsis, acute renal failure, pulmonary edema, anemia of chronic disease, severe hypoalbuminemia and h/o  atrial fibrillation. He is off bid dose of amiodarone since 12/14/2019. He is not on B-blocker and uses amlodipine for blood pressure control. TSH was 7.130 micro IU on 11/30/2019.  Past Medical History:  Diagnosis Date  . Acute on chronic respiratory failure with hypoxia ([place])   . Atrial fibrillation with RVR ([place])   . COVID-19 virus infection   . Pneumonia due to COVID-19 virus   . Severe sepsis [facility])     Past Surgical History: Colonscopy: 02/28/2007 for polyps. EGD - 03/06/2017  The histories are not reviewed yet. Please review them in the "History" navigator section and refresh this [place].  Family history: non-contributory.  Social History:  Quit smoking 2007. Occasional alcohol use. No drug use.  Allergies: Penicillins - Rash and itching  No medications prior to admission.  See list in chart  Results for orders placed or performed during the hospital encounter of 11/21/19 (from the past 48 hour(s))  Magnesium     Status: None   Collection Time: 12/17/19  5:15 AM  Result Value Ref Range   Magnesium 1.9 1.7 - 2.4 mg/dL    Comment: Performed at [facility], [place] [address] [place], [place] [address]  Lactic acid, plasma     Status: None   Collection Time: 12/17/19  5:15 AM  Result Value Ref Range   Lactic Acid, Venous 1.3 0.5 - 1.9 mmol/L    Comment: Performed at [facility] [address] [place], [place] [address]  CBC     Status: Abnormal   Collection Time: 12/17/19  5:15 AM  Result Value Ref Range   WBC 22.2 (H) 4.0 - 10.5 K/uL   RBC 2.92 (L) 4.22 - 5.81 MIL/uL    Hemoglobin 8.1 (L) 13.0 - 17.0 g/dL   HCT 24.6 (L) 39.0 - 52.0 %   MCV 84.2 80.0 - 100.0 fL   MCH 27.7 26.0 - 34.0 pg   MCHC 32.9 30.0 - 36.0 g/dL   RDW 21.0 (H) 11.5 - 15.5 %   Platelets 142 (L) 150 - 400 K/uL    Comment: REPEATED TO VERIFY   nRBC 0.1 0.0 - 0.2 %    Comment: Performed at [facility], [place] [address] [place], [place] [address]  Renal function panel     Status: Abnormal   Collection Time: 12/17/19  5:15 AM  Result Value Ref Range   Sodium 126 (L) 135 - 145 mmol/L   Potassium 4.8 3.5 - 5.1 mmol/L   Chloride 89 (L) 98 - 111 mmol/L   CO2 23 22 - 32 mmol/L   Glucose, Bld 187 (H) 70 - 99 mg/dL    Comment: Glucose reference range applies only to samples taken after fasting for at least 8 hours.   BUN 113 (H) 8 - 23 mg/dL   Creatinine, Ser 3.35 (H) 0.61 - 1.24 mg/dL   Calcium 7.9 (L) 8.9 - 10.3 mg/dL   Phosphorus 5.3 (H) 2.5 - 4.6 mg/dL   Albumin 1.5 (L) 3.5 - 5.0 g/dL  GFR calc non Af Amer 17 (L) >60 mL/min   GFR calc Af Amer 20 (L) >60 mL/min   Anion gap 14 5 - 15    Comment: Performed at [facility] [address], [place], [place] [address]  Blood gas, arterial     Status: None   Collection Time: 12/17/19  6:43 AM  Result Value Ref Range   FIO2 50.00    pH, Arterial 7.357 7.350 - 7.450   pCO2 arterial 46.4 32.0 - 48.0 mmHg   pO2, Arterial 86.7 83.0 - 108.0 mmHg   Bicarbonate 25.4 20.0 - 28.0 mmol/L   Acid-Base Excess 0.5 0.0 - 2.0 mmol/L   O2 Saturation 96.3 %   Patient temperature 37.0    Collection site RIGHT RADIAL    Drawn by [id number]     Comment: COLLECTED BY RT   Sample type ARTERIAL DRAW    Allens test (pass/fail) PASS PASS    Comment: Performed at [facility] [address], [place], [place] [address]  Blood gas, arterial     Status: Abnormal   Collection Time: 12/17/19  6:09 PM  Result Value Ref Range   FIO2 80.00    pH, Arterial 7.370 7.350 - 7.450   pCO2 arterial 42.9 32.0 - 48.0 mmHg   pO2, Arterial 73.0 (L) 83.0 -  108.0 mmHg   Bicarbonate 24.2 20.0 - 28.0 mmol/L   Acid-base deficit 0.4 0.0 - 2.0 mmol/L   O2 Saturation 94.0 %   Patient temperature 37.0    Collection site RIGHT RADIAL    Drawn by DRAWN BY RN     Comment: Norm Salt   Sample type ARTERIAL DRAW    Allens test (pass/fail) PASS PASS    Comment: Performed at [facility], [place] [address] [place], [place] [address]  C Difficile Quick Screen w PCR reflex     Status: None   Collection Time: 12/17/19  7:00 PM  Result Value Ref Range   C Diff antigen NEGATIVE NEGATIVE   C Diff toxin NEGATIVE NEGATIVE   C Diff interpretation No C. difficile detected.     Comment: Performed at [facility], [place] [address], [place], [place] [address]  CBC     Status: Abnormal   Collection Time: 12/18/19  6:27 AM  Result Value Ref Range   WBC 30.4 (H) 4.0 - 10.5 K/uL   RBC 2.70 (L) 4.22 - 5.81 MIL/uL   Hemoglobin 7.5 (L) 13.0 - 17.0 g/dL   HCT 22.7 (L) 39.0 - 52.0 %   MCV 84.1 80.0 - 100.0 fL   MCH 27.8 26.0 - 34.0 pg   MCHC 33.0 30.0 - 36.0 g/dL   RDW 20.6 (H) 11.5 - 15.5 %   Platelets 143 (L) 150 - 400 K/uL    Comment: REPEATED TO VERIFY   nRBC 0.0 0.0 - 0.2 %    Comment: Performed at [facility], [place] [address] [place], [place] [address]  Renal function panel     Status: Abnormal   Collection Time: 12/18/19  6:27 AM  Result Value Ref Range   Sodium 125 (L) 135 - 145 mmol/L   Potassium 5.0 3.5 - 5.1 mmol/L   Chloride 88 (L) 98 - 111 mmol/L   CO2 21 (L) 22 - 32 mmol/L   Glucose, Bld 144 (H) 70 - 99 mg/dL    Comment: Glucose reference range applies only to samples taken after fasting for at least 8  hours.   BUN 134 (H) 8 - 23 mg/dL   Creatinine, Ser 3.82 (H) 0.61 - 1.24 mg/dL   Calcium 8.2 (L) 8.9 - 10.3 mg/dL   Phosphorus 5.3 (H) 2.5 - 4.6 mg/dL   Albumin 1.7 (L) 3.5 - 5.0 g/dL   GFR calc non Af Amer 15 (L) >60 mL/min   GFR calc Af Amer 17 (L) >60 mL/min   Anion gap 16 (H) 5 - 15    Comment: Performed at [facility] [address], [place], [place] [address]  Magnesium     Status: None   Collection Time: 12/18/19  6:27 AM  Result Value Ref Range   Magnesium 2.0 1.7 - 2.4 mg/dL    Comment: Performed at [facility] [address] [place], [place] [address]   DG Chest Port 1 View  Result Date: 12/17/2019 CLINICAL DATA:  Pneumonia. EXAM: PORTABLE CHEST 1 VIEW COMPARISON:  March 18th 2021. FINDINGS: Stable cardiomediastinal silhouette. Tracheostomy tube is unchanged in position. Interval placement of right internal jugular catheter with distal tip in expected position of right atrium. Stable bilateral lung opacities are noted concerning for edema or pneumonia with associated pleural effusions. No pneumothorax is noted. Bony thorax is unremarkable. IMPRESSION: Interval placement of right internal jugular catheter with distal tip in expected position of right atrium. Stable bilateral lung opacities as described above. Electronically Signed   By: Marijo Conception M.D.   On: 12/17/2019 08:33    Review Of Systems As per PMH. Patient unable to provide information.  P: 60, R: 28, BP: 140/60 There were no vitals taken for this visit. There is no height or weight on file to calculate BMI. General appearance: cooperative, appears stated age and moderate respiratory distress Head: Normocephalic, atraumatic. Eyes: Brown eyes, Pale conjunctiva, corneas clear.  Neck: No adenopathy, no carotid bruit, no JVD, supple, symmetrical, trachea midline and thyroid not enlarged. Resp: Rhonchi to auscultation bilaterally. Cardio: Regular rate and rhythm, S1, S2 normal, II/VI systolic murmur, no click, rub or gallop GI: Soft, non-tender; bowel sounds normal; no organomegaly. Extremities: 2 + generalized edema, no cyanosis or clubbing. Skin: Warm and dry.  Neurologic: Alert and oriented X 0. 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 May need smaller dose for rate control if heart rate improves. Agree with Ultrafiltration for volume overload. Awaiting EKG. Will need 2-D echocardiogram for LV function. Will increase levothyroxine to 50 mcg per day. Keep BP over 130/60 and less than 160/90 for known h/o HCM with obstruction.  Time spent: Review of old records, Lab, x-rays, EKG, other cardiac tests, examination, discussion with patient over 70 minutes.  Birdie Riddle, MD  12/18/2019, 2:27 PM

## 2019-12-19 ENCOUNTER — Other Ambulatory Visit (HOSPITAL_COMMUNITY): Payer: Medicare HMO

## 2019-12-19 DIAGNOSIS — A419 Sepsis, unspecified organism: Secondary | ICD-10-CM | POA: Diagnosis not present

## 2019-12-19 DIAGNOSIS — U071 COVID-19: Secondary | ICD-10-CM | POA: Diagnosis not present

## 2019-12-19 DIAGNOSIS — I4891 Unspecified atrial fibrillation: Secondary | ICD-10-CM | POA: Diagnosis not present

## 2019-12-19 DIAGNOSIS — J9621 Acute and chronic respiratory failure with hypoxia: Secondary | ICD-10-CM | POA: Diagnosis not present

## 2019-12-19 NOTE — Progress Notes (Signed)
Ref: [name], MD   Subjective:  Resting comfortably. Improved lung sounds after hemodialysis. Echocardiogram shows preserved LV systolic function with HCM, non-obstructive, mild MR and severe TR, pleural effusions and mild ascites.  Objective:  Vital Signs in the last 24 hours:  T: 99 degree F, P: 60, R: 28 BP : 140/63  Physical Exam: BP Readings from Last 1 Encounters:  No data found for BP     Wt Readings from Last 1 Encounters:  No data found for Wt    Weight change:  There is no height or weight on file to calculate BMI. HEENT: [place]/AT, Eyes-Brown, Conjunctiva-Pale, Sclera-Non-icteric Neck: No JVD, No bruit, Tracheostomy in place. Lungs:  Clearing, Bilateral. Cardiac:  Regular rhythm, normal S1 and S2, no S3. II/VI systolic murmur. Abdomen:  Soft, BS present. Extremities:  1- 2 + edema present. No cyanosis. No clubbing. CNS: AxOx0, Cranial nerves grossly intact.  Skin: Warm and dry.   Intake/Output from previous day: No intake/output data recorded.    Lab Results: BMET    Component Value Date/Time   NA 125 (L) 12/18/2019 0627   NA 126 (L) 12/17/2019 0515   NA 129 (L) 12/16/2019 0803   K 5.0 12/18/2019 0627   K 4.8 12/17/2019 0515   K 4.5 12/16/2019 0803   CL 88 (L) 12/18/2019 0627   CL 89 (L) 12/17/2019 0515   CL 92 (L) 12/16/2019 0803   CO2 21 (L) 12/18/2019 0627   CO2 23 12/17/2019 0515   CO2 25 12/16/2019 0803   GLUCOSE 144 (H) 12/18/2019 0627   GLUCOSE 187 (H) 12/17/2019 0515   GLUCOSE 146 (H) 12/16/2019 0803   BUN 134 (H) 12/18/2019 0627   BUN 113 (H) 12/17/2019 0515   BUN 101 (H) 12/16/2019 0803   CREATININE 3.82 (H) 12/18/2019 0627   CREATININE 3.35 (H) 12/17/2019 0515   CREATININE 3.08 (H) 12/16/2019 0803   CALCIUM 8.2 (L) 12/18/2019 0627   CALCIUM 7.9 (L) 12/17/2019 0515   CALCIUM 7.6 (L) 12/16/2019 0803   CALCIUM 7.5 (L) 12/15/2019 2026   GFRNONAA 15 (L) 12/18/2019 0627   GFRNONAA 17 (L) 12/17/2019 0515   GFRNONAA 19 (L) 12/16/2019 0803    GFRAA 17 (L) 12/18/2019 0627   GFRAA 20 (L) 12/17/2019 0515   GFRAA 22 (L) 12/16/2019 0803   CBC    Component Value Date/Time   WBC 30.4 (H) 12/18/2019 0627   RBC 2.70 (L) 12/18/2019 0627   HGB 7.5 (L) 12/18/2019 0627   HCT 22.7 (L) 12/18/2019 0627   PLT 143 (L) 12/18/2019 0627   MCV 84.1 12/18/2019 0627   MCH 27.8 12/18/2019 0627   MCHC 33.0 12/18/2019 0627   RDW 20.6 (H) 12/18/2019 0627   HEPATIC Function Panel Recent Labs    11/22/19 0617  PROT 5.1*   HEMOGLOBIN A1C No components found for: HGA1C,  MPG CARDIAC ENZYMES No results found for: CKTOTAL, CKMB, CKMBINDEX, TROPONINI BNP No results for input(s): PROBNP in the last 8760 hours. TSH Recent Labs    11/22/19 0617 11/30/19 0656  TSH 7.162* 7.130*   CHOLESTEROL No results for input(s): CHOL in the last 8760 hours.  Scheduled Meds: Continuous Infusions: PRN Meds:.heparin, lidocaine  Assessment/Plan: Acute respiratory failure with hypoxemia COVID-19 pneumonia Sepsis Sinus bradycardia, improved H/O atrial fibrillation, CHA2DS2VASc score of 3 H/O amiodarone use HCM without obstruction  Chronic diastolic left heart failure Acute renal failure Anemia of chronic disease Severe hyponatremia Severe hypoalbuminemia  Holding amiodarone till resting heart rate is 70/min. Restart  at 100 mg. daily after that. Appreciate renal consult and ultrafiltration. Not a candidate for anticoagulation with significant anemia.   LOS: 0 days   Time spent including chart review, lab review, examination, discussion with patient's nurse, physician assitant : 30 min   Dixie Dials  MD  12/19/2019, 8:25 PM

## 2019-12-19 NOTE — Progress Notes (Addendum)
Pulmonary Critical Care Medicine [place]   PULMONARY CRITICAL CARE SERVICE  PROGRESS NOTE  Date of Service: 12/19/2019  Shane Banks  NFA:[id number]  DOB: 07-17-1946   DOA: 11/21/2019  Referring Physician: [name], MD  HPI: Shane Banks is a 74 y.o. male seen for follow up of Acute on Chronic Respiratory Failure.  Patient remains on full support on the ventilator today with mild fever noted.  Currently on 60% FiO2.  Medications: Reviewed on Rounds  Physical Exam:  Vitals: Pulse 70 respirations 20 BP 166/77 O2 sat 92% temp 100.3  Ventilator Settings ventilator AC VC plus rate of 18 tidal volume 350 PEEP of 12 and FiO2 of 60%  . General: Comfortable at this time . Eyes: Grossly normal lids, irises & conjunctiva . ENT: grossly tongue is normal . Neck: no obvious mass . Cardiovascular: S1 S2 normal no gallop . Respiratory: No rales or rhonchi noted . Abdomen: soft . Skin: no rash seen on limited exam . Musculoskeletal: not rigid . Psychiatric:unable to assess . Neurologic: no seizure no involuntary movements         Lab Data:   Basic Metabolic Panel: Recent Labs  Lab 12/14/19 0738 12/14/19 0738 12/15/19 0424 12/15/19 2026 12/16/19 0803 12/17/19 0515 12/18/19 0627  NA 133*   < > 129* 127* 129* 126* 125*  K 5.5*   < > 5.4* 5.0 4.5 4.8 5.0  CL 93*   < > 91* 90* 92* 89* 88*  CO2 26   < > 23 22 25 23  21*  GLUCOSE 107*   < > 164* 88 146* 187* 144*  BUN 95*   < > 107* 123* 101* 113* 134*  CREATININE 2.85*   < > 3.25* 3.48* 3.08* 3.35* 3.82*  CALCIUM 8.0*   < > 7.9* 7.5*  7.5* 7.6* 7.9* 8.2*  MG 2.0  --  2.0  --  2.0 1.9 2.0  PHOS 4.0   < > 4.9* 4.8* 4.3 5.3* 5.3*   < > = values in this interval not displayed.    ABG: Recent Labs  Lab 12/15/19 1325 12/17/19 0643 12/17/19 1809  PHART 7.387 7.357 7.370  PCO2ART 43.6 46.4 42.9  PO2ART 123* 86.7 73.0*  HCO3 25.5 25.4 24.2  O2SAT 98.6 96.3 94.0    Liver Function Tests: Recent Labs   Lab 12/15/19 0424 12/15/19 2026 12/16/19 0803 12/17/19 0515 12/18/19 0627  ALBUMIN 1.5* 1.4* 1.4* 1.5* 1.7*   No results for input(s): LIPASE, AMYLASE in the last 168 hours. No results for input(s): AMMONIA in the last 168 hours.  CBC: Recent Labs  Lab 12/15/19 0424 12/15/19 2026 12/16/19 0803 12/17/19 0515 12/18/19 0627  WBC 20.1* 21.2* 22.7* 22.2* 30.4*  HGB 8.6* 7.8* 8.0* 8.1* 7.5*  HCT 26.3* 24.2* 24.7* 24.6* 22.7*  MCV 83.2 85.5 83.4 84.2 84.1  PLT 192 154 148* 142* 143*    Cardiac Enzymes: No results for input(s): CKTOTAL, CKMB, CKMBINDEX, TROPONINI in the last 168 hours.  BNP (last 3 results) No results for input(s): BNP in the last 8760 hours.  ProBNP (last 3 results) No results for input(s): PROBNP in the last 8760 hours.  Radiological Exams: ECHOCARDIOGRAM COMPLETE  Result Date: 12/19/2019    ECHOCARDIOGRAM REPORT   Patient Name:   Shane Banks Date of Exam: 12/19/2019 Medical Rec #:  [id number]    Height:       74.0 in Accession #:    [id number]   Weight:       305.0  lb Date of Birth:  June 21, 1946    BSA:          2.600 m Patient Age:    82 years     BP:           142/71 mmHg Patient Gender: M            HR:           67 bpm. Exam Location:  Inpatient Procedure: 2D Echo, Cardiac Doppler and Color Doppler Indications:     LVAD Evaluation.  History:         Patient has no prior history of Echocardiogram examinations.                  Arrythmias:Atrial Fibrillation. COVID-19. Respiratory Failure.  Sonographer:     Jonelle Sidle Dance Referring Phys:  [place] Diagnosing Phys: Dixie Dials MD  Sonographer Comments: Echo performed with patient supine and on artificial respirator. IMPRESSIONS  1. Left ventricular ejection fraction, by estimation, is 65 to 70%. The left ventricle has hyperdynamic function. The left ventricle has no regional wall motion abnormalities. There is mild concentric left ventricular hypertrophy of the septal and lateral segments. Left ventricular  diastolic parameters are consistent with Grade I diastolic dysfunction (impaired relaxation).  2. Prominent moderator band. Right ventricular systolic function is normal. The right ventricular size is mildly enlarged. There is moderately elevated pulmonary artery systolic pressure.  3. Left atrial size was moderately dilated.  4. Right atrial size was moderately dilated.  5. The pericardial effusion is circumferential. There is no evidence of cardiac tamponade. Large pleural effusion in both left and right lateral regions.  6. The mitral valve is normal in structure. Mild mitral valve regurgitation.  7. Tricuspid valve regurgitation is severe.  8. The aortic valve is tricuspid. Aortic valve regurgitation is not visualized. Mild aortic valve sclerosis is present, with no evidence of aortic valve stenosis.  9. There is mild (Grade II) atheroma plaque involving the ascending aorta. 10. The inferior vena cava is dilated in size with <50% respiratory variability, suggesting right atrial pressure of 15 mmHg. FINDINGS  Left Ventricle: Left ventricular ejection fraction, by estimation, is 65 to 70%. The left ventricle has hyperdynamic function. The left ventricle has no regional wall motion abnormalities. The left ventricular internal cavity size was small. There is mild concentric left ventricular hypertrophy of the septal and lateral segments. Left ventricular diastolic parameters are consistent with Grade I diastolic dysfunction (impaired relaxation). Right Ventricle: Prominent moderator band. The right ventricular size is mildly enlarged. No increase in right ventricular wall thickness. Right ventricular systolic function is normal. There is moderately elevated pulmonary artery systolic pressure. The  tricuspid regurgitant velocity is 3.33 m/s, and with an assumed right atrial pressure of 15 mmHg, the estimated right ventricular systolic pressure is 38.2 mmHg. Left Atrium: Left atrial size was moderately dilated. Right  Atrium: Right atrial size was moderately dilated. Pericardium: Trivial pericardial effusion is present. The pericardial effusion is circumferential. There is no evidence of cardiac tamponade. Mitral Valve: The mitral valve is normal in structure. There is mild thickening of the mitral valve leaflet(s). There is moderate calcification of the anterior mitral valve leaflet(s). Normal mobility of the mitral valve leaflets. Mild mitral annular calcification. Mild mitral valve regurgitation. Tricuspid Valve: The tricuspid valve is normal in structure. Tricuspid valve regurgitation is severe. Aortic Valve: The aortic valve is tricuspid. . There is mild thickening and mild calcification of the aortic valve. Aortic valve regurgitation is  not visualized. Mild aortic valve sclerosis is present, with no evidence of aortic valve stenosis. Mild aortic valve annular calcification. There is mild thickening of the aortic valve. There is mild calcification of the aortic valve. Pulmonic Valve: The pulmonic valve was normal in structure. Pulmonic valve regurgitation is mild. Aorta: The aortic root is normal in size and structure. There is mild (Grade II) atheroma plaque involving the ascending aorta. Venous: The inferior vena cava is dilated in size with less than 50% respiratory variability, suggesting right atrial pressure of 15 mmHg. IAS/Shunts: No atrial level shunt detected by color flow Doppler. Additional Comments: There is a large pleural effusion in both left and right lateral regions. Mild ascites is present.  LEFT VENTRICLE PLAX 2D LVIDd:         4.45 cm  Diastology LVIDs:         2.14 cm  LV e' lateral:   9.25 cm/s LV PW:         1.18 cm  LV E/e' lateral: 8.3 LV IVS:        1.18 cm  LV e' medial:    5.11 cm/s LVOT diam:     1.90 cm  LV E/e' medial:  15.0 LV SV:         53 LV SV Index:   21 LVOT Area:     2.84 cm  RIGHT VENTRICLE             IVC RV Basal diam:  3.27 cm     IVC diam: 2.75 cm RV Mid diam:    3.20 cm RV S  prime:     13.10 cm/s TAPSE (M-mode): 1.5 cm LEFT ATRIUM             Index       RIGHT ATRIUM           Index LA diam:        4.00 cm 1.54 cm/m  RA Area:     20.10 cm LA Vol (A2C):   86.6 ml 33.31 ml/m RA Volume:   60.20 ml  23.15 ml/m LA Vol (A4C):   49.6 ml 19.08 ml/m LA Biplane Vol: 71.7 ml 27.58 ml/m  AORTIC VALVE LVOT Vmax:   108.00 cm/s LVOT Vmean:  68.800 cm/s LVOT VTI:    0.188 m  AORTA Ao Root diam: 3.80 cm Ao Asc diam:  3.70 cm MITRAL VALVE               TRICUSPID VALVE MV Area (PHT): 2.07 cm    TR Peak grad:   44.4 mmHg MV Decel Time: 366 msec    TR Vmax:        333.00 cm/s MV E velocity: 76.60 cm/s MV A velocity: 97.00 cm/s  SHUNTS MV E/A ratio:  0.79        Systemic VTI:  0.19 m                            Systemic Diam: 1.90 cm Dixie Dials MD Electronically signed by Dixie Dials MD Signature Date/Time: 12/19/2019/9:39:32 AM    Final     Assessment/Plan Active Problems:   Acute on chronic respiratory failure with hypoxia ([place])   COVID-19 virus infection   Atrial fibrillation with RVR ([facility])   Severe sepsis ([facility])   Pneumonia due to COVID-19 virus   1. Acute on chronic respiratory failure hypoxia continue current ventilator settings currently on AC VC plus rate  of 18 tidal volume 350 PEEP of 12 and FiO2 of 60%.  Good tidal volumes are noted at this time.  Continue aggressive pulmonary and supportive measures. 2. COVID-19 virus infection in resolution phase 3. Atrial fibrillation rate now rate controlled 4. Severe sepsis hemodynamics are stable 5. Pneumonia due to COVID-19 treated we will continue to monitor   I have personally seen and evaluated the patient, evaluated laboratory and imaging results, formulated the assessment and plan and placed orders. The Patient requires high complexity decision making with multiple systems involvement.  Rounds were done with the Respiratory Therapy Director and Staff therapists and discussed with nursing staff also.  Allyne Gee, MD  [facility] Pulmonary Critical Care Medicine Sleep Medicine

## 2019-12-19 NOTE — Progress Notes (Signed)
  Echocardiogram 2D Echocardiogram has been performed.  Randa Lynn Jerilynn Feldmeier 12/19/2019, 8:57 AM

## 2019-12-20 DIAGNOSIS — J9621 Acute and chronic respiratory failure with hypoxia: Secondary | ICD-10-CM | POA: Diagnosis not present

## 2019-12-20 DIAGNOSIS — J90 Pleural effusion, not elsewhere classified: Secondary | ICD-10-CM | POA: Diagnosis not present

## 2019-12-20 DIAGNOSIS — I4891 Unspecified atrial fibrillation: Secondary | ICD-10-CM | POA: Diagnosis not present

## 2019-12-20 DIAGNOSIS — U071 COVID-19: Secondary | ICD-10-CM | POA: Diagnosis not present

## 2019-12-20 LAB — CULTURE, BLOOD (ROUTINE X 2)
Culture: NO GROWTH
Culture: NO GROWTH
Special Requests: ADEQUATE
Special Requests: ADEQUATE

## 2019-12-20 LAB — RENAL FUNCTION PANEL
Albumin: 1.4 g/dL — ABNORMAL LOW (ref 3.5–5.0)
Anion gap: 16 — ABNORMAL HIGH (ref 5–15)
BUN: 133 mg/dL — ABNORMAL HIGH (ref 8–23)
CO2: 21 mmol/L — ABNORMAL LOW (ref 22–32)
Calcium: 8.2 mg/dL — ABNORMAL LOW (ref 8.9–10.3)
Chloride: 92 mmol/L — ABNORMAL LOW (ref 98–111)
Creatinine, Ser: 3.51 mg/dL — ABNORMAL HIGH (ref 0.61–1.24)
GFR calc Af Amer: 19 mL/min — ABNORMAL LOW (ref >60)
GFR calc non Af Amer: 16 mL/min — ABNORMAL LOW (ref >60)
Glucose, Bld: 122 mg/dL — ABNORMAL HIGH (ref 70–99)
Phosphorus: 4.8 mg/dL — ABNORMAL HIGH (ref 2.5–4.6)
Potassium: 4.9 mmol/L (ref 3.5–5.1)
Sodium: 129 mmol/L — ABNORMAL LOW (ref 135–145)

## 2019-12-20 LAB — CBC
HCT: 20.9 % — ABNORMAL LOW (ref 39.0–52.0)
Hemoglobin: 6.8 g/dL — CL (ref 13.0–17.0)
MCH: 27.4 pg (ref 26.0–34.0)
MCHC: 32.5 g/dL (ref 30.0–36.0)
MCV: 84.3 fL (ref 80.0–100.0)
Platelets: 111 10*3/uL — ABNORMAL LOW (ref 150–400)
RBC: 2.48 MIL/uL — ABNORMAL LOW (ref 4.22–5.81)
RDW: 20.7 % — ABNORMAL HIGH (ref 11.5–15.5)
WBC: 36.3 10*3/uL — ABNORMAL HIGH (ref 4.0–10.5)
nRBC: 0 % (ref 0.0–0.2)

## 2019-12-20 LAB — SARS CORONAVIRUS 2 (TAT 6-24 HRS): SARS Coronavirus 2: NEGATIVE

## 2019-12-20 LAB — PREPARE RBC (CROSSMATCH)

## 2019-12-20 LAB — LACTIC ACID, PLASMA: Lactic Acid, Venous: 1.2 mmol/L (ref 0.5–1.9)

## 2019-12-20 NOTE — Progress Notes (Addendum)
Pulmonary Critical Care Medicine [place]   PULMONARY CRITICAL CARE SERVICE  PROGRESS NOTE  Date of Service: 12/20/2019  Eran Mistry  DJS:[id number]  DOB: 1946/05/29   DOA: 11/21/2019  Referring Physician: [name], MD  HPI: Shane Banks is a 74 y.o. male seen for follow up of Acute on Chronic Respiratory Failure.  Patient mains on full support on the ventilator at this time currently on FiO2 of 55% had a hemoglobin of 6.8 today and therefore is receiving packed red blood cells at this time no weaning currently.  Medications: Reviewed on Rounds  Physical Exam:  Vitals: Pulse 82 respirations 36 BP 142/76 O2 sat 96% temp 96.1  Ventilator Settings ventilator mode AC PC PC rate of 18 tidal volume 350 PEEP of 12 with an FiO2 of 55%  . General: Comfortable at this time . Eyes: Grossly normal lids, irises & conjunctiva . ENT: grossly tongue is normal . Neck: no obvious mass . Cardiovascular: S1 S2 normal no gallop . Respiratory: No rales or rhonchi noted . Abdomen: soft . Skin: no rash seen on limited exam . Musculoskeletal: not rigid . Psychiatric:unable to assess . Neurologic: no seizure no involuntary movements         Lab Data:   Basic Metabolic Panel: Recent Labs  Lab 12/14/19 0738 12/14/19 8850 12/15/19 0424 12/15/19 0424 12/15/19 2026 12/16/19 0803 12/17/19 0515 12/18/19 0627 12/20/19 0553  NA 133*   < > 129*   < > 127* 129* 126* 125* 129*  K 5.5*   < > 5.4*   < > 5.0 4.5 4.8 5.0 4.9  CL 93*   < > 91*   < > 90* 92* 89* 88* 92*  CO2 26   < > 23   < > 22 25 23  21* 21*  GLUCOSE 107*   < > 164*   < > 88 146* 187* 144* 122*  BUN 95*   < > 107*   < > 123* 101* 113* 134* 133*  CREATININE 2.85*   < > 3.25*   < > 3.48* 3.08* 3.35* 3.82* 3.51*  CALCIUM 8.0*   < > 7.9*   < > 7.5*  7.5* 7.6* 7.9* 8.2* 8.2*  MG 2.0  --  2.0  --   --  2.0 1.9 2.0  --   PHOS 4.0   < > 4.9*   < > 4.8* 4.3 5.3* 5.3* 4.8*   < > = values in this interval not  displayed.    ABG: Recent Labs  Lab 12/15/19 1325 12/17/19 0643 12/17/19 1809  PHART 7.387 7.357 7.370  PCO2ART 43.6 46.4 42.9  PO2ART 123* 86.7 73.0*  HCO3 25.5 25.4 24.2  O2SAT 98.6 96.3 94.0    Liver Function Tests: Recent Labs  Lab 12/15/19 2026 12/16/19 0803 12/17/19 0515 12/18/19 0627 12/20/19 0553  ALBUMIN 1.4* 1.4* 1.5* 1.7* 1.4*   No results for input(s): LIPASE, AMYLASE in the last 168 hours. No results for input(s): AMMONIA in the last 168 hours.  CBC: Recent Labs  Lab 12/15/19 2026 12/16/19 0803 12/17/19 0515 12/18/19 0627 12/20/19 0831  WBC 21.2* 22.7* 22.2* 30.4* 36.3*  HGB 7.8* 8.0* 8.1* 7.5* 6.8*  HCT 24.2* 24.7* 24.6* 22.7* 20.9*  MCV 85.5 83.4 84.2 84.1 84.3  PLT 154 148* 142* 143* 111*    Cardiac Enzymes: No results for input(s): CKTOTAL, CKMB, CKMBINDEX, TROPONINI in the last 168 hours.  BNP (last 3 results) No results for input(s): BNP in the last 8760  hours.  ProBNP (last 3 results) No results for input(s): PROBNP in the last 8760 hours.  Radiological Exams: ECHOCARDIOGRAM COMPLETE  Result Date: 12/19/2019    ECHOCARDIOGRAM REPORT   Patient Name:   KHAMANI FAIRLEY Date of Exam: 12/19/2019 Medical Rec #:  [id number]    Height:       74.0 in Accession #:    [id number]   Weight:       305.0 lb Date of Birth:  [birth year]    BSA:          53.600 m Patient Age:    64 years     BP:           142/71 mmHg Patient Gender: M            HR:           67 bpm. Exam Location:  Inpatient Procedure: 2D Echo, Cardiac Doppler and Color Doppler Indications:     LVAD Evaluation.  History:         Patient has no prior history of Echocardiogram examinations.                  Arrythmias:Atrial Fibrillation. COVID-19. Respiratory Failure.  Sonographer:     Jonelle Sidle Dance Referring Phys:  [place] Diagnosing Phys: Dixie Dials MD  Sonographer Comments: Echo performed with patient supine and on artificial respirator. IMPRESSIONS  1. Left ventricular ejection  fraction, by estimation, is 65 to 70%. The left ventricle has hyperdynamic function. The left ventricle has no regional wall motion abnormalities. There is mild concentric left ventricular hypertrophy of the septal and lateral segments. Left ventricular diastolic parameters are consistent with Grade I diastolic dysfunction (impaired relaxation).  2. Prominent moderator band. Right ventricular systolic function is normal. The right ventricular size is mildly enlarged. There is moderately elevated pulmonary artery systolic pressure.  3. Left atrial size was moderately dilated.  4. Right atrial size was moderately dilated.  5. The pericardial effusion is circumferential. There is no evidence of cardiac tamponade. Large pleural effusion in both left and right lateral regions.  6. The mitral valve is normal in structure. Mild mitral valve regurgitation.  7. Tricuspid valve regurgitation is severe.  8. The aortic valve is tricuspid. Aortic valve regurgitation is not visualized. Mild aortic valve sclerosis is present, with no evidence of aortic valve stenosis.  9. There is mild (Grade II) atheroma plaque involving the ascending aorta. 10. The inferior vena cava is dilated in size with <50% respiratory variability, suggesting right atrial pressure of 15 mmHg. FINDINGS  Left Ventricle: Left ventricular ejection fraction, by estimation, is 65 to 70%. The left ventricle has hyperdynamic function. The left ventricle has no regional wall motion abnormalities. The left ventricular internal cavity size was small. There is mild concentric left ventricular hypertrophy of the septal and lateral segments. Left ventricular diastolic parameters are consistent with Grade I diastolic dysfunction (impaired relaxation). Right Ventricle: Prominent moderator band. The right ventricular size is mildly enlarged. No increase in right ventricular wall thickness. Right ventricular systolic function is normal. There is moderately elevated pulmonary  artery systolic pressure. The  tricuspid regurgitant velocity is 3.33 m/s, and with an assumed right atrial pressure of 15 mmHg, the estimated right ventricular systolic pressure is 49.4 mmHg. Left Atrium: Left atrial size was moderately dilated. Right Atrium: Right atrial size was moderately dilated. Pericardium: Trivial pericardial effusion is present. The pericardial effusion is circumferential. There is no evidence of cardiac tamponade. Mitral Valve: The mitral valve  is normal in structure. There is mild thickening of the mitral valve leaflet(s). There is moderate calcification of the anterior mitral valve leaflet(s). Normal mobility of the mitral valve leaflets. Mild mitral annular calcification. Mild mitral valve regurgitation. Tricuspid Valve: The tricuspid valve is normal in structure. Tricuspid valve regurgitation is severe. Aortic Valve: The aortic valve is tricuspid. . There is mild thickening and mild calcification of the aortic valve. Aortic valve regurgitation is not visualized. Mild aortic valve sclerosis is present, with no evidence of aortic valve stenosis. Mild aortic valve annular calcification. There is mild thickening of the aortic valve. There is mild calcification of the aortic valve. Pulmonic Valve: The pulmonic valve was normal in structure. Pulmonic valve regurgitation is mild. Aorta: The aortic root is normal in size and structure. There is mild (Grade II) atheroma plaque involving the ascending aorta. Venous: The inferior vena cava is dilated in size with less than 50% respiratory variability, suggesting right atrial pressure of 15 mmHg. IAS/Shunts: No atrial level shunt detected by color flow Doppler. Additional Comments: There is a large pleural effusion in both left and right lateral regions. Mild ascites is present.  LEFT VENTRICLE PLAX 2D LVIDd:         4.45 cm  Diastology LVIDs:         2.14 cm  LV e' lateral:   9.25 cm/s LV PW:         1.18 cm  LV E/e' lateral: 8.3 LV IVS:         1.18 cm  LV e' medial:    5.11 cm/s LVOT diam:     1.90 cm  LV E/e' medial:  15.0 LV SV:         53 LV SV Index:   21 LVOT Area:     2.84 cm  RIGHT VENTRICLE             IVC RV Basal diam:  3.27 cm     IVC diam: 2.75 cm RV Mid diam:    3.20 cm RV S prime:     13.10 cm/s TAPSE (M-mode): 1.5 cm LEFT ATRIUM             Index       RIGHT ATRIUM           Index LA diam:        4.00 cm 1.54 cm/m  RA Area:     20.10 cm LA Vol (A2C):   86.6 ml 33.31 ml/m RA Volume:   60.20 ml  23.15 ml/m LA Vol (A4C):   49.6 ml 19.08 ml/m LA Biplane Vol: 71.7 ml 27.58 ml/m  AORTIC VALVE LVOT Vmax:   108.00 cm/s LVOT Vmean:  68.800 cm/s LVOT VTI:    0.188 m  AORTA Ao Root diam: 3.80 cm Ao Asc diam:  3.70 cm MITRAL VALVE               TRICUSPID VALVE MV Area (PHT): 2.07 cm    TR Peak grad:   44.4 mmHg MV Decel Time: 366 msec    TR Vmax:        333.00 cm/s MV E velocity: 76.60 cm/s MV A velocity: 97.00 cm/s  SHUNTS MV E/A ratio:  0.79        Systemic VTI:  0.19 m                            Systemic Diam: 1.90 cm Dixie Dials  MD Electronically signed by Dixie Dials MD Signature Date/Time: 12/19/2019/9:39:32 AM    Final     Assessment/Plan Active Problems:   Acute on chronic respiratory failure with hypoxia ([place])   COVID-19 virus infection   Atrial fibrillation with RVR ([place])   Severe sepsis ([place])   Pneumonia due to COVID-19 virus   1. Acute on chronic respiratory failure hypoxia continue full support on ventilator at this time we will continue to advance weaning after patient received packed red blood cells.  Continue supportive measures and pulmonary toilet. 2. COVID-19 virus infection in resolution phase 3. Atrial fibrillation rate now rate controlled 4. Severe sepsis hemodynamics are stable 5. Pneumonia due to COVID-19 treated we will continue to monitor   I have personally seen and evaluated the patient, evaluated laboratory and imaging results, formulated the assessment and plan and placed orders. The Patient  requires high complexity decision making with multiple systems involvement.  Rounds were done with the Respiratory Therapy Director and Staff therapists and discussed with nursing staff also.  Allyne Gee, MD [facility] Pulmonary Critical Care Medicine Sleep Medicine

## 2019-12-20 NOTE — Consult Note (Addendum)
Ref: [name], MD   Subjective:  Resting comfortably. Barely opens eyes to tapping on shoulder. Undergoing hemodialysis with aim to remove 1.5 L of fluid. VS stable.  Objective:  Vital Signs in the last 24 hours:  P: 73, R: 26, BP: 130/60  Physical Exam: BP Readings from Last 1 Encounters:  No data found for BP     Wt Readings from Last 1 Encounters:  No data found for Wt    Weight change:  There is no height or weight on file to calculate BMI. HEENT: [place]/AT, Eyes-Brown, Conjunctiva-Pale, Sclera-Non-icteric Neck: No JVD, No bruit, Trachea midline. Lungs:  Clearing, Bilateral. Cardiac:  Regular rhythm, normal S1 and S2, no S3. II/VI systolic murmur. Abdomen:  Soft, non-tender. BS present. Extremities:  1-2 + edema, more so in upper extremities, abdomen and scrotum. No cyanosis. No clubbing. CNS: AxOx0, Cranial nerves grossly intact.  Skin: Warm and dry.   Intake/Output from previous day: No intake/output data recorded.    Lab Results: BMET    Component Value Date/Time   NA 129 (L) 12/20/2019 0553   NA 125 (L) 12/18/2019 0627   NA 126 (L) 12/17/2019 0515   K 4.9 12/20/2019 0553   K 5.0 12/18/2019 0627   K 4.8 12/17/2019 0515   CL 92 (L) 12/20/2019 0553   CL 88 (L) 12/18/2019 0627   CL 89 (L) 12/17/2019 0515   CO2 21 (L) 12/20/2019 0553   CO2 21 (L) 12/18/2019 0627   CO2 23 12/17/2019 0515   GLUCOSE 122 (H) 12/20/2019 0553   GLUCOSE 144 (H) 12/18/2019 0627   GLUCOSE 187 (H) 12/17/2019 0515   BUN 133 (H) 12/20/2019 0553   BUN 134 (H) 12/18/2019 0627   BUN 113 (H) 12/17/2019 0515   CREATININE 3.51 (H) 12/20/2019 0553   CREATININE 3.82 (H) 12/18/2019 0627   CREATININE 3.35 (H) 12/17/2019 0515   CALCIUM 8.2 (L) 12/20/2019 0553   CALCIUM 8.2 (L) 12/18/2019 0627   CALCIUM 7.9 (L) 12/17/2019 0515   CALCIUM 7.5 (L) 12/15/2019 2026   GFRNONAA 16 (L) 12/20/2019 0553   GFRNONAA 15 (L) 12/18/2019 0627   GFRNONAA 17 (L) 12/17/2019 0515   GFRAA 19 (L) 12/20/2019  0553   GFRAA 17 (L) 12/18/2019 0627   GFRAA 20 (L) 12/17/2019 0515   CBC    Component Value Date/Time   WBC 36.3 (H) 12/20/2019 0831   RBC 2.48 (L) 12/20/2019 0831   HGB 6.8 (LL) 12/20/2019 0831   HCT 20.9 (L) 12/20/2019 0831   PLT 111 (L) 12/20/2019 0831   MCV 84.3 12/20/2019 0831   MCH 27.4 12/20/2019 0831   MCHC 32.5 12/20/2019 0831   RDW 20.7 (H) 12/20/2019 0831   HEPATIC Function Panel Recent Labs    11/22/19 0617  PROT 5.1*   HEMOGLOBIN A1C No components found for: HGA1C,  MPG CARDIAC ENZYMES No results found for: CKTOTAL, CKMB, CKMBINDEX, TROPONINI BNP No results for input(s): PROBNP in the last 8760 hours. TSH Recent Labs    11/22/19 0617 11/30/19 0656  TSH 7.162* 7.130*   CHOLESTEROL No results for input(s): CHOL in the last 8760 hours.  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   LOS: 0 days   Time spent including chart  review, lab review, examination, discussion with patient, nurse and PA : 30 min   Dixie Dials  MD  12/20/2019, 10:33 AM

## 2019-12-20 NOTE — Progress Notes (Signed)
[facility]  ROUNDING NOTE   Subjective:  Patient seen and evaluated at bedside. Remains critically ill at this time. Still on the ventilator. Requiring high amounts of oxygen.    Objective:  Vital signs in last 24 hours:  Temperature 98.2 pulse 69 respirations 20 blood pressure 130/60  Physical Exam: General: Critically ill-appearing  Head: Normocephalic, atraumatic. Moist oral mucosal membranes  Eyes: Anicteric  Neck: Tracheostomy in place  Lungs:  Scattered rhonchi, vent assisted  Heart: S1S2 no rubs  Abdomen:  Soft, nontender, bowel sounds present, PEG tube in place  Extremities: 3+ peripheral edema.  Neurologic: Not following commands  Skin: No rash  Access: Right IJ temporary dialysis catheter    Basic Metabolic Panel: Recent Labs  Lab 12/14/19 0738 12/14/19 6962 12/15/19 0424 12/15/19 0424 12/15/19 2026 12/15/19 2026 12/16/19 0803 12/16/19 0803 12/17/19 0515 12/18/19 9528 12/20/19 0553  NA 133*   < > 129*   < > 127*  --  129*  --  126* 125* 129*  K 5.5*   < > 5.4*   < > 5.0  --  4.5  --  4.8 5.0 4.9  CL 93*   < > 91*   < > 90*  --  92*  --  89* 88* 92*  CO2 26   < > 23   < > 22  --  25  --  23 21* 21*  GLUCOSE 107*   < > 164*   < > 88  --  146*  --  187* 144* 122*  BUN 95*   < > 107*   < > 123*  --  101*  --  113* 134* 133*  CREATININE 2.85*   < > 3.25*   < > 3.48*  --  3.08*  --  3.35* 3.82* 3.51*  CALCIUM 8.0*   < > 7.9*   < > 7.5*  7.5*   < > 7.6*   < > 7.9* 8.2* 8.2*  MG 2.0  --  2.0  --   --   --  2.0  --  1.9 2.0  --   PHOS 4.0   < > 4.9*   < > 4.8*  --  4.3  --  5.3* 5.3* 4.8*   < > = values in this interval not displayed.    Liver Function Tests: Recent Labs  Lab 12/15/19 2026 12/16/19 0803 12/17/19 0515 12/18/19 0627 12/20/19 0553  ALBUMIN 1.4* 1.4* 1.5* 1.7* 1.4*   No results for input(s): LIPASE, AMYLASE in the last 168 hours. No results for input(s): AMMONIA in the last 168 hours.  CBC: Recent Labs  Lab  12/15/19 0424 12/15/19 2026 12/16/19 0803 12/17/19 0515 12/18/19 0627  WBC 20.1* 21.2* 22.7* 22.2* 30.4*  HGB 8.6* 7.8* 8.0* 8.1* 7.5*  HCT 26.3* 24.2* 24.7* 24.6* 22.7*  MCV 83.2 85.5 83.4 84.2 84.1  PLT 192 154 148* 142* 143*    Cardiac Enzymes: No results for input(s): CKTOTAL, CKMB, CKMBINDEX, TROPONINI in the last 168 hours.  BNP: Invalid input(s): POCBNP  CBG: No results for input(s): GLUCAP in the last 168 hours.  Microbiology: Results for orders placed or performed during the hospital encounter of 11/21/19  Culture, Urine     Status: Abnormal   Collection Time: 11/22/19  1:25 PM   Specimen: Urine, Random  Result Value Ref Range Status   Specimen Description URINE, RANDOM  Final   Special Requests   Final    NONE Performed at [facility], [place] [address], [place], [place] [address]    Culture 50,000 COLONIES/mL YEAST (A)  Final   Report Status 11/23/2019 FINAL  Final  Culture, blood (routine x 2)     Status: None   Collection Time: 11/22/19  1:40 PM   Specimen: BLOOD LEFT HAND  Result Value Ref Range Status   Specimen Description BLOOD LEFT HAND  Final   Special Requests   Final    BOTTLES DRAWN AEROBIC ONLY Blood Culture results may not be optimal due to an inadequate volume of blood received in culture bottles   Culture   Final    NO GROWTH 5 DAYS Performed at [facility], [place] [address], [name], [place] [address]    Report Status 11/27/2019 FINAL  Final  Culture, blood (routine x 2)     Status: None   Collection Time: 11/22/19  1:44 PM   Specimen: BLOOD LEFT HAND  Result Value Ref Range Status   Specimen Description BLOOD LEFT HAND  Final   Special Requests   Final    BOTTLES DRAWN AEROBIC ONLY Blood Culture results may not be optimal due to an inadequate volume of blood received in culture bottles   Culture   Final    NO GROWTH 5 DAYS Performed at [facility], [place] [address] [place], [place] [address]    Report Status  11/27/2019 FINAL  Final  Culture, respiratory (non-expectorated)     Status: None   Collection Time: 11/22/19  4:59 PM   Specimen: Tracheal Aspirate; Respiratory  Result Value Ref Range Status   Specimen Description TRACHEAL ASPIRATE  Final   Special Requests NONE  Final   Gram Stain   Final    ABUNDANT WBC PRESENT, PREDOMINANTLY PMN NO ORGANISMS SEEN    Culture   Final    NO GROWTH 2 DAYS Performed at [facility], [address] [place], [place] [address]    Report Status 11/24/2019 FINAL  Final  Culture, respiratory (non-expectorated)     Status: None   Collection Time: 12/04/19  2:22 AM   Specimen: Tracheal Aspirate; Respiratory  Result Value Ref Range Status   Specimen Description TRACHEAL ASPIRATE  Final   Special Requests NONE  Final   Gram Stain   Final    ABUNDANT WBC PRESENT, PREDOMINANTLY PMN ABUNDANT GRAM NEGATIVE RODS Performed at [facility], [address], [place],  [address]    Culture   Final    MODERATE PSEUDOMONAS AERUGINOSA FEW ESCHERICHIA COLI Confirmed Extended Spectrum Beta-Lactamase Producer (ESBL).  In bloodstream infections from ESBL organisms, carbapenems are preferred over piperacillin/tazobactam. They are shown to have a lower risk of mortality.    Report Status 12/09/2019 FINAL  Final   Organism ID, Bacteria PSEUDOMONAS AERUGINOSA  Final   Organism ID, Bacteria ESCHERICHIA COLI  Final      Susceptibility   Escherichia coli - MIC*    AMPICILLIN >=32 RESISTANT Resistant     CEFAZOLIN >=64 RESISTANT Resistant     CEFEPIME 2 SENSITIVE Sensitive     CEFTAZIDIME RESISTANT Resistant     CEFTRIAXONE >=64 RESISTANT Resistant     CIPROFLOXACIN <=0.25 SENSITIVE Sensitive     GENTAMICIN >=16 RESISTANT Resistant     IMIPENEM <=0.25 SENSITIVE Sensitive     TRIMETH/SULFA >=320 RESISTANT Resistant     AMPICILLIN/SULBACTAM 16 INTERMEDIATE Intermediate     PIP/TAZO <=4 SENSITIVE Sensitive     * FEW ESCHERICHIA COLI   Pseudomonas  aeruginosa - MIC*    CEFTAZIDIME 16 INTERMEDIATE Intermediate     CIPROFLOXACIN >=4 RESISTANT Resistant     GENTAMICIN 2 SENSITIVE Sensitive     IMIPENEM >=16 RESISTANT Resistant     * MODERATE PSEUDOMONAS AERUGINOSA  Culture, Urine     Status: Abnormal   Collection Time: 12/04/19  6:33 PM   Specimen: Urine, Random  Result Value Ref Range Status   Specimen Description URINE, RANDOM  Final   Special Requests   Final    NONE Performed at [facility], [place] [address] [place], [place] [address]    Culture >=100,000 COLONIES/mL PSEUDOMONAS AERUGINOSA (A)  Final   Report Status 12/06/2019 FINAL  Final   Organism ID, Bacteria PSEUDOMONAS AERUGINOSA (A)  Final      Susceptibility   Pseudomonas aeruginosa - MIC*    CEFTAZIDIME 4 SENSITIVE Sensitive     CIPROFLOXACIN >=4 RESISTANT Resistant     GENTAMICIN 4 SENSITIVE Sensitive     IMIPENEM 2 SENSITIVE Sensitive     PIP/TAZO 16 SENSITIVE Sensitive     * >=100,000 COLONIES/mL PSEUDOMONAS AERUGINOSA  Culture, blood (routine x 2)     Status: Abnormal   Collection Time: 12/11/19  9:39 AM   Specimen: BLOOD LEFT HAND  Result Value Ref Range Status   Specimen Description BLOOD LEFT HAND  Final   Special Requests   Final    BOTTLES DRAWN AEROBIC AND ANAEROBIC Blood Culture adequate volume   Culture  Setup Time   Final    GRAM POSITIVE COCCI IN CLUSTERS ANAEROBIC BOTTLE ONLY CRITICAL RESULT CALLED TO, READ BACK BY AND VERIFIED WITH: RN R CUMMICNGS [id number] AT 42 BY CM    Culture (A)  Final    STAPHYLOCOCCUS SPECIES (COAGULASE NEGATIVE) THE SIGNIFICANCE OF ISOLATING THIS ORGANISM FROM A SINGLE SET OF BLOOD CULTURES WHEN MULTIPLE SETS ARE DRAWN IS UNCERTAIN. PLEASE NOTIFY THE MICROBIOLOGY DEPARTMENT WITHIN ONE WEEK IF SPECIATION AND SENSITIVITIES ARE REQUIRED. Performed at [facility], [place] [address], [place], [place] [address]    Report Status 12/14/2019 FINAL  Final  Culture, blood (routine x 2)     Status: None   Collection  Time: 12/11/19  9:47 AM   Specimen: BLOOD  Result Value Ref Range Status   Specimen Description BLOOD RIGHT ANTECUBITAL  Final   Special Requests   Final    BOTTLES DRAWN AEROBIC AND ANAEROBIC Blood Culture adequate volume   Culture  Setup Time   Final    CORRECTED RESULTS NO ORGANISMS SEEN PREVIOUSLY REPORTED AS: GRAM POSITIVE COCCI IN CLUSTERS CORRECTED RESULTS CALLED TO: RN R CUMMINGS [id number] AT 920 AM BY CM    Culture   Final    NO GROWTH 5 DAYS Performed at [facility], [place] [address], [place], [place] [address]    Report Status 12/16/2019 FINAL  Final  Culture, respiratory (non-expectorated)     Status: None (Preliminary result)   Collection Time: 12/11/19 10:27 AM   Specimen: Tracheal Aspirate; Respiratory  Result Value Ref Range Status   Specimen Description TRACHEAL ASPIRATE  Final   Special Requests   Final    NONE Performed at [facility], [place] [address], [place], [place] [address]    Gram Stain NO WBC SEEN NO ORGANISMS SEEN   Final   Culture   Final    FEW PSEUDOMONAS AERUGINOSA FEW STENOTROPHOMONAS MALTOPHILIA PSEUDOMONAS AERUGINOSA Sent to [place] for further susceptibility testing.    Report Status PENDING  Incomplete   Organism ID, Bacteria PSEUDOMONAS AERUGINOSA  Final   Organism ID, Bacteria STENOTROPHOMONAS MALTOPHILIA  Final      Susceptibility   Pseudomonas aeruginosa - MIC*    CEFTAZIDIME 16 INTERMEDIATE Intermediate     CIPROFLOXACIN >=4 RESISTANT Resistant     GENTAMICIN 2 SENSITIVE Sensitive     IMIPENEM >=16 RESISTANT Resistant     * FEW PSEUDOMONAS AERUGINOSA   Stenotrophomonas maltophilia - MIC*    LEVOFLOXACIN 0.5 SENSITIVE Sensitive     TRIMETH/SULFA <=20 SENSITIVE Sensitive     * FEW STENOTROPHOMONAS MALTOPHILIA  Culture, Urine     Status: Abnormal   Collection Time: 12/11/19 11:30 AM   Specimen: Urine, Random  Result Value Ref Range Status   Specimen Description URINE, RANDOM  Final   Special Requests NONE  Final    Culture (A)  Final    <10,000 COLONIES/mL INSIGNIFICANT GROWTH Performed at [place] [address], [place], [place] [address]    Report Status 12/12/2019 FINAL  Final  Culture, blood (routine x 2)     Status: None   Collection Time: 12/15/19 12:37 PM   Specimen: BLOOD LEFT HAND  Result Value Ref Range Status   Specimen Description BLOOD LEFT HAND  Final   Special Requests   Final    BOTTLES DRAWN AEROBIC AND ANAEROBIC Blood Culture adequate volume   Culture   Final    NO GROWTH 5 DAYS Performed at [facility], [place] [address] [place], [place] [address]    Report Status 12/20/2019 FINAL  Final  Culture, blood (routine x 2)     Status: None   Collection Time: 12/15/19 12:42 PM   Specimen: BLOOD LEFT HAND  Result Value Ref Range Status   Specimen Description BLOOD LEFT HAND  Final   Special Requests   Final    BOTTLES DRAWN AEROBIC AND ANAEROBIC Blood Culture adequate volume   Culture   Final    NO GROWTH 5 DAYS Performed at [facility], [place] [address], [place], [place] [address]    Report Status 12/20/2019 FINAL  Final  C Difficile Quick Screen w PCR reflex     Status: None   Collection Time: 12/17/19  7:00 PM  Result Value Ref Range Status   C Diff antigen NEGATIVE NEGATIVE Final   C Diff toxin NEGATIVE NEGATIVE Final   C Diff interpretation No C. difficile detected.  Final    Comment: Performed at [facility], [place] [address], [place], [place] [address]    Coagulation Studies: No results for input(s): LABPROT, INR in the last 72 hours.  Urinalysis: No results for input(s): COLORURINE, LABSPEC, PHURINE, GLUCOSEU, HGBUR, BILIRUBINUR, KETONESUR, PROTEINUR, UROBILINOGEN, NITRITE, LEUKOCYTESUR in the last 72 hours.  Invalid input(s): APPERANCEUR    Imaging: ECHOCARDIOGRAM COMPLETE  Result Date: 12/19/2019    ECHOCARDIOGRAM REPORT   Patient Name:   Shane Banks Date of Exam: 12/19/2019 Medical Rec #:  [id number]    Height:       74.0 in  Accession #:    [id number]   Weight:       305.0 lb Date of Birth:  16-Nov-1945    BSA:          2.600 m Patient Age:    37 years     BP:           142/71 mmHg Patient Gender: M            HR:  67 bpm. Exam Location:  Inpatient Procedure: 2D Echo, Cardiac Doppler and Color Doppler Indications:     LVAD Evaluation.  History:         Patient has no prior history of Echocardiogram examinations.                  Arrythmias:Atrial Fibrillation. COVID-19. Respiratory Failure.  Sonographer:     Jonelle Sidle Dance Referring Phys:  [place] Diagnosing Phys: Dixie Dials MD  Sonographer Comments: Echo performed with patient supine and on artificial respirator. IMPRESSIONS  1. Left ventricular ejection fraction, by estimation, is 65 to 70%. The left ventricle has hyperdynamic function. The left ventricle has no regional wall motion abnormalities. There is mild concentric left ventricular hypertrophy of the septal and lateral segments. Left ventricular diastolic parameters are consistent with Grade I diastolic dysfunction (impaired relaxation).  2. Prominent moderator band. Right ventricular systolic function is normal. The right ventricular size is mildly enlarged. There is moderately elevated pulmonary artery systolic pressure.  3. Left atrial size was moderately dilated.  4. Right atrial size was moderately dilated.  5. The pericardial effusion is circumferential. There is no evidence of cardiac tamponade. Large pleural effusion in both left and right lateral regions.  6. The mitral valve is normal in structure. Mild mitral valve regurgitation.  7. Tricuspid valve regurgitation is severe.  8. The aortic valve is tricuspid. Aortic valve regurgitation is not visualized. Mild aortic valve sclerosis is present, with no evidence of aortic valve stenosis.  9. There is mild (Grade II) atheroma plaque involving the ascending aorta. 10. The inferior vena cava is dilated in size with <50% respiratory variability,  suggesting right atrial pressure of 15 mmHg. FINDINGS  Left Ventricle: Left ventricular ejection fraction, by estimation, is 65 to 70%. The left ventricle has hyperdynamic function. The left ventricle has no regional wall motion abnormalities. The left ventricular internal cavity size was small. There is mild concentric left ventricular hypertrophy of the septal and lateral segments. Left ventricular diastolic parameters are consistent with Grade I diastolic dysfunction (impaired relaxation). Right Ventricle: Prominent moderator band. The right ventricular size is mildly enlarged. No increase in right ventricular wall thickness. Right ventricular systolic function is normal. There is moderately elevated pulmonary artery systolic pressure. The  tricuspid regurgitant velocity is 3.33 m/s, and with an assumed right atrial pressure of 15 mmHg, the estimated right ventricular systolic pressure is 02.4 mmHg. Left Atrium: Left atrial size was moderately dilated. Right Atrium: Right atrial size was moderately dilated. Pericardium: Trivial pericardial effusion is present. The pericardial effusion is circumferential. There is no evidence of cardiac tamponade. Mitral Valve: The mitral valve is normal in structure. There is mild thickening of the mitral valve leaflet(s). There is moderate calcification of the anterior mitral valve leaflet(s). Normal mobility of the mitral valve leaflets. Mild mitral annular calcification. Mild mitral valve regurgitation. Tricuspid Valve: The tricuspid valve is normal in structure. Tricuspid valve regurgitation is severe. Aortic Valve: The aortic valve is tricuspid. . There is mild thickening and mild calcification of the aortic valve. Aortic valve regurgitation is not visualized. Mild aortic valve sclerosis is present, with no evidence of aortic valve stenosis. Mild aortic valve annular calcification. There is mild thickening of the aortic valve. There is mild calcification of the aortic valve.  Pulmonic Valve: The pulmonic valve was normal in structure. Pulmonic valve regurgitation is mild. Aorta: The aortic root is normal in size and structure. There is mild (Grade II) atheroma plaque involving the ascending  aorta. Venous: The inferior vena cava is dilated in size with less than 50% respiratory variability, suggesting right atrial pressure of 15 mmHg. IAS/Shunts: No atrial level shunt detected by color flow Doppler. Additional Comments: There is a large pleural effusion in both left and right lateral regions. Mild ascites is present.  LEFT VENTRICLE PLAX 2D LVIDd:         4.45 cm  Diastology LVIDs:         2.14 cm  LV e' lateral:   9.25 cm/s LV PW:         1.18 cm  LV E/e' lateral: 8.3 LV IVS:        1.18 cm  LV e' medial:    5.11 cm/s LVOT diam:     1.90 cm  LV E/e' medial:  15.0 LV SV:         53 LV SV Index:   21 LVOT Area:     2.84 cm  RIGHT VENTRICLE             IVC RV Basal diam:  3.27 cm     IVC diam: 2.75 cm RV Mid diam:    3.20 cm RV S prime:     13.10 cm/s TAPSE (M-mode): 1.5 cm LEFT ATRIUM             Index       RIGHT ATRIUM           Index LA diam:        4.00 cm 1.54 cm/m  RA Area:     20.10 cm LA Vol (A2C):   86.6 ml 33.31 ml/m RA Volume:   60.20 ml  23.15 ml/m LA Vol (A4C):   49.6 ml 19.08 ml/m LA Biplane Vol: 71.7 ml 27.58 ml/m  AORTIC VALVE LVOT Vmax:   108.00 cm/s LVOT Vmean:  68.800 cm/s LVOT VTI:    0.188 m  AORTA Ao Root diam: 3.80 cm Ao Asc diam:  3.70 cm MITRAL VALVE               TRICUSPID VALVE MV Area (PHT): 2.07 cm    TR Peak grad:   44.4 mmHg MV Decel Time: 366 msec    TR Vmax:        333.00 cm/s MV E velocity: 76.60 cm/s MV A velocity: 97.00 cm/s  SHUNTS MV E/A ratio:  0.79        Systemic VTI:  0.19 m                            Systemic Diam: 1.90 cm Dixie Dials MD Electronically signed by Dixie Dials MD Signature Date/Time: 12/19/2019/9:39:32 AM    Final      Medications:     heparin, lidocaine  Assessment/ Plan:  74 y.o. male with a PMHx of acute  respiratory failure status post COVID-19 infection status post tracheostomy placement, atrial fibrillation, hypertension, CVA, gout, hypertrophic cardiomyopathy, who was admitted to [facility] on 11/21/2019 for ongoing treatment of acute respiratory failure.  1.  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  Patient remains critically ill.  Still has high BUN and creatinine.  These parameters should improve with dialysis treatment today.  We will plan for dialysis on Friday as well..  2.  Acute respiratory failure secondary to COVID-19 infection and its sequela.  -Patient remains on the ventilator and appears to have post Covid bacterial pneumonia.  Being treated with  multiple antibiotics.  Continue ventilatory support.  3.  Hyperkalemia.  Potassium down to 4.9.  Continue to monitor closely.  4.  Hyponatremia.  Serum sodium currently 129 and improved.  Should improve further with ongoing dialysis treatment.  5.  Overall guarded prognosis.   LOS: 0 Rosco Harriott 3/24/20218:46 AM

## 2019-12-21 ENCOUNTER — Other Ambulatory Visit: Payer: Self-pay

## 2019-12-21 ENCOUNTER — Other Ambulatory Visit (HOSPITAL_COMMUNITY): Payer: Medicare HMO

## 2019-12-21 DIAGNOSIS — U071 COVID-19: Secondary | ICD-10-CM | POA: Diagnosis not present

## 2019-12-21 DIAGNOSIS — J9621 Acute and chronic respiratory failure with hypoxia: Secondary | ICD-10-CM | POA: Diagnosis not present

## 2019-12-21 DIAGNOSIS — J90 Pleural effusion, not elsewhere classified: Secondary | ICD-10-CM

## 2019-12-21 DIAGNOSIS — I4891 Unspecified atrial fibrillation: Secondary | ICD-10-CM | POA: Diagnosis not present

## 2019-12-21 HISTORY — PX: IR THORACENTESIS ASP PLEURAL SPACE W/IMG GUIDE: IMG5380

## 2019-12-21 LAB — BPAM RBC
Blood Product Expiration Date: 202104262359
ISSUE DATE / TIME: 202103241714
Unit Type and Rh: 5100

## 2019-12-21 LAB — TYPE AND SCREEN
ABO/RH(D): O POS
Antibody Screen: NEGATIVE
Unit division: 0

## 2019-12-21 LAB — PROTEIN, PLEURAL OR PERITONEAL FLUID: Total protein, fluid: <3 g/dL

## 2019-12-21 LAB — LACTATE DEHYDROGENASE, PLEURAL OR PERITONEAL FLUID: LD, Fluid: 162 U/L — ABNORMAL HIGH (ref 3–23)

## 2019-12-21 LAB — BASIC METABOLIC PANEL
Anion gap: 11 (ref 5–15)
BUN: 88 mg/dL — ABNORMAL HIGH (ref 8–23)
CO2: 26 mmol/L (ref 22–32)
Calcium: 8.7 mg/dL — ABNORMAL LOW (ref 8.9–10.3)
Chloride: 95 mmol/L — ABNORMAL LOW (ref 98–111)
Creatinine, Ser: 2.44 mg/dL — ABNORMAL HIGH (ref 0.61–1.24)
GFR calc Af Amer: 30 mL/min — ABNORMAL LOW (ref >60)
GFR calc non Af Amer: 25 mL/min — ABNORMAL LOW (ref >60)
Glucose, Bld: 164 mg/dL — ABNORMAL HIGH (ref 70–99)
Potassium: 4.1 mmol/L (ref 3.5–5.1)
Sodium: 132 mmol/L — ABNORMAL LOW (ref 135–145)

## 2019-12-21 LAB — CBC
HCT: 24.6 % — ABNORMAL LOW (ref 39.0–52.0)
Hemoglobin: 7.9 g/dL — ABNORMAL LOW (ref 13.0–17.0)
MCH: 27.4 pg (ref 26.0–34.0)
MCHC: 32.1 g/dL (ref 30.0–36.0)
MCV: 85.4 fL (ref 80.0–100.0)
Platelets: 109 10*3/uL — ABNORMAL LOW (ref 150–400)
RBC: 2.88 MIL/uL — ABNORMAL LOW (ref 4.22–5.81)
RDW: 19.9 % — ABNORMAL HIGH (ref 11.5–15.5)
WBC: 31 10*3/uL — ABNORMAL HIGH (ref 4.0–10.5)
nRBC: 0 % (ref 0.0–0.2)

## 2019-12-21 LAB — MAGNESIUM: Magnesium: 1.7 mg/dL (ref 1.7–2.4)

## 2019-12-21 LAB — GLUCOSE, PLEURAL OR PERITONEAL FLUID: Glucose, Fluid: 153 mg/dL

## 2019-12-21 MED ORDER — LIDOCAINE HCL 1 % IJ SOLN
INTRAMUSCULAR | Status: AC
  Filled 2019-12-21: qty 20

## 2019-12-21 MED ORDER — LIDOCAINE HCL 1 % IJ SOLN
INTRAMUSCULAR | Status: DC | PRN
  Administered 2019-12-21: 10 mL

## 2019-12-21 NOTE — Progress Notes (Signed)
PROGRESS NOTE    Shane Banks  ONG:[id number] DOB: [birth year] DOA: 11/21/2019   Brief Narrative:  Shane Banks an 74 y.o.malewho was admitted to [facility] on 11/21/2019. He has a past medical history of peripheral vascular disease, hypertension, cardiomyopathy, gout. He apparently was admitted to the acute facility for worsening shortness of breath on 10/06/2019 through 11/21/2019. He was also previously admitted on 10/02/2019 for COVID-19 infection. After he was discharged he apparently had worsening shortness of breath, hypoxemia. He was also noted to have fever of 101.5. Chest x-ray at that time was consistent with pneumonia. He also had elevated troponin which was thought to be secondary to demand ischemia. He initially was not given remdesivir or plasma but when he presented back the second time he was more hypoxemic and was given steroids. Patient hospital course complicated by worsening shortness of breath leading to respiratory failure which led to intubation. He was on the ventilator. He had a difficult time weaning therefore underwent tracheostomy. Per records from the outside facility on 10/22/2019 patient apparently had purple blistering on the sacral region for which the wound care team was consulted. The wounds worsened and on 11/13/2019 he underwent debridement of the sacrum with debridement of skin, muscle, necrotic fat. Infectious disease was also consulted. Since admission he was on multiple antibiotics initially with ceftriaxone/azithromycin/Fortaz and then with meropenem/vancomycin. Patient continued to have progressive increase in WBC count and new onset fevers despite being on antibiotics. Per ID consult he was started on tigecycline for intra-abdominal coverage and empiric p.o. vancomycin. P.o. vancomycin was later changed to Dificid on 10/24/2019 for diarrhea. Cultures however were negative. His hep C was also positive. He was treated with remdesivir and  convalescent plasma. Hospital course complicated by thrombocytopenia, acute renal failure. Due to his worsening pressure ulcer he underwent laparoscopic diverting end colostomy on 11/16/2019. He was treated with multiple antibiotics including IV vancomycin, azithromycin, ceftriaxone, tigecycline, Fortaz, meropenem, Flagyl and Tobra nebulizers?  His respiratory cultures from here showed Pseudomonas aeruginosa, E. coli.  He was on treatment with ceftazidime and ciprofloxacin.  However, patient did not improve.  Repeat respiratory cultures from 12/11/2019 again showing few Pseudomonas aeruginosa, few Stenotrophomonas maltophilia.  Per the primary team over the weekend the patient crashed and had worsening respiratory failure and was on 100% FiO2.  Therefore he was given gentamicin. After that BUN/creatinine worsened.  He also had episode of nausea and vomiting, likely aspiration.  Patient currently sedated.  On 60% FiO2.  Patient on dialysis.   Assessment & Plan:   Active Problems:   Acute on chronic respiratory failure with hypoxia (HCC)   COVID-19 virus infection   Atrial fibrillation with RVR (HCC)   Severe sepsis (HCC)   Pneumonia due to COVID-19 virus Pneumonia due to Pseudomonas, stenotrophomonas Sacral pressure ulcer unstageable Leukocytosis Acute renal failure Anasarca Gram-positive bacteremia UTI Atrial fibrillation Diabetes mellitus type 2 Dysphagia Protein calorie malnutrition Hepatitis C  Acute on chronic hypoxemic respiratory failure: Multifactorial. Initially started with COVID-19 infection. However, patient subsequently had bacterial pneumonia with Pseudomonas. He has received several antibiotics at the outside facility including Fortaz, meropenem, cefepime, tobramycin nebulizers,vancomycin, azithromycin, ceftriaxone, tigecycline.  Previously he was on treatment with vancomycin, meropenem, Flagyl.  Respiratory cultures again showing Pseudomonas and also stenotrophomonas.  He  was given gentamicin last weekend due to worsening respiratory failure.  However had acute renal failure with elevated BUN and creatinine.  Therefore would recommend to stop the gentamicin and started him on Avycaz, Levaquin which would cover  for both Pseudomonas and stenotrophomonas.  He also received treatment with Zyvox. Reluctant to use tobramycin nebulizers in the setting of acute renal failure.  However, CT chest without contrast showing large bilateral pleural effusions, severe bilateral airspace disease with near complete atelectasis or consolidation throughout the left lung.  Added tobramycin nebulizers.  Continue aggressive pulmonary toileting.  Pulmonary following.  Recent COVID-19 infection: Patient was already treated with remdesivir and convalescent plasma at the outside facility. He was on steroids. Continue supportive management per the primary team.  Pneumonia: His respiratory cultures at the outside facility reportedly showed Pseudomonas.  He has been treated with multiple antibiotics as mentioned above.  More recently he has been on treatment with ceftazidime and ciprofloxacin for Pseudomonas and E. coli noted on the respiratory cultures.  However, recent respiratory cultures again showing the Pseudomonas and stenotrophomonas.  His WBC count was worsening and his respiratory failure was worsening.  Therefore discontinued ceftazidime and ciprofloxacin and started him on Avycaz and Levaquin.  He also received treatment with Zyvox.   Since he is not improving on the above regimen we have no choice but to add tobramycin nebulizers.  UTI: Urine cultures from 12/04/2019 showed Pseudomonas aeruginosa.  Repeat urine cultures from 12/11/2019 showing less than 10,000 colonies per mL, insignificant growth.  Antibiotics as mentioned above.  Sacral pressure ulcer unstageable: Patient has a sacral pressure ulcer that was unstageable and had surgical debridement at the outside facility.  He had  debridement done on 12/01/2019. Recent pictures from showing that the wound is worsening.  He currently has a wound VAC.  Continue local wound care.   Severe sepsis: Patient was on pressors at the outside facility. He has received several rounds of antibiotics as mentioned above.   He has received several rounds of antibiotics.  His respiratory status continues to be tenuous.  Switched to [employer], Levaquin, tobramycin nebulizers also added.  Continue to monitor.  Gram-positive bacteremia: His blood cultures are growing coagulase-negative staph in 1 bottle.  Repeat blood cultures ordered.  He was started on Zyvox. Repeat blood cultures were negative therefore discontinued the Zyvox.  Acute renal failure: Patient had elevated BUN/creatinine.    Patient started on dialysis.  Antibiotics renally dosed.  Atrial fibrillation: Continue medications and management per primary team.  Leukocytosis: Leukocytosis worsening.  Diflucan added.  Antibiotics as mentioned above. Continue to monitor counts.  Diabetes mellitus type 2: Continue to monitor Accu-Cheks, medications for diabetes per the primary team.  He will need proper glycemic control in order to enable wound healing.  Dysphagia: Unfortunately due to his dysphagia he is very high risk for aspiration and worsening respiratory failure secondary to aspiration pneumonia.  Protein calorie malnutrition: Management per the primary team.  Hepatitis C: Patient will need to follow-up in the outpatient clinic once his acute issues resolve.  Unfortunately due to his complex medical problems he is very high risk for worsening and decompensation. Plan of care discussed with the primary team.  Subjective: He remains on the ventilator, on 60% FiO2 with PEEP of 12.  Chest CT today showing worsening severe bilateral airspace disease with near complete atelectasis or consolidation throughout the left lung.  Worsening leukocytosis.  Objective: Vitals:  Temperature 98.8, pulse 67, respiratory rate 28, blood pressure 131/66, pulse oximetry 97% on 60% FiO2, PEEP of 12  Examination: Constitutional:Ill-appearing male, sedated Eyes: PERLA  ENMT: external ears and nose appear normal, Lips appears normal,unable to evaluate the oropharynx Neck:Has trach in place CVS: S1-S2, no murmur Respiratory:Rhonchi,  no wheezing Abdomen:ostomy, positive bowel sounds, PEG tube Musculoskeletal:Upper and lower extremity edema with anasarca Neuro:Patient sedated, not responding, unable to do a neurologic exam at this time Psych:Unable to assess at this time Skin:Sacral pressure ulcer unstageable   Data Reviewed: I have personally reviewed following labs and imaging studies  CBC: Recent Labs  Lab 12/16/19 0803 12/17/19 0515 12/18/19 0627 12/20/19 0831 12/21/19 0524  WBC 22.7* 22.2* 30.4* 36.3* 31.0*  HGB 8.0* 8.1* 7.5* 6.8* 7.9*  HCT 24.7* 24.6* 22.7* 20.9* 24.6*  MCV 83.4 84.2 84.1 84.3 85.4  PLT 148* 142* 143* 111* 098*   Basic Metabolic Panel: Recent Labs  Lab 12/15/19 0424 12/15/19 0424 12/15/19 2026 12/15/19 2026 12/16/19 0803 12/17/19 0515 12/18/19 0627 12/20/19 0553 12/21/19 1356  NA 129*   < > 127*   < > 129* 126* 125* 129* 132*  K 5.4*   < > 5.0   < > 4.5 4.8 5.0 4.9 4.1  CL 91*   < > 90*   < > 92* 89* 88* 92* 95*  CO2 23   < > 22   < > 25 23 21* 21* 26  GLUCOSE 164*   < > 88   < > 146* 187* 144* 122* 164*  BUN 107*   < > 123*   < > 101* 113* 134* 133* 88*  CREATININE 3.25*   < > 3.48*   < > 3.08* 3.35* 3.82* 3.51* 2.44*  CALCIUM 7.9*   < > 7.5*  7.5*   < > 7.6* 7.9* 8.2* 8.2* 8.7*  MG 2.0  --   --   --  2.0 1.9 2.0  --  1.7  PHOS 4.9*   < > 4.8*  --  4.3 5.3* 5.3* 4.8*  --    < > = values in this interval not displayed.   GFR: CrCl cannot be calculated (Unknown ideal weight.). Liver Function Tests: Recent Labs  Lab 12/15/19 2026 12/16/19 0803 12/17/19 0515 12/18/19 0627 12/20/19 0553  ALBUMIN 1.4* 1.4*  1.5* 1.7* 1.4*   No results for input(s): LIPASE, AMYLASE in the last 168 hours. No results for input(s): AMMONIA in the last 168 hours. Coagulation Profile: No results for input(s): INR, PROTIME in the last 168 hours. Cardiac Enzymes: No results for input(s): CKTOTAL, CKMB, CKMBINDEX, TROPONINI in the last 168 hours. BNP (last 3 results) No results for input(s): PROBNP in the last 8760 hours. HbA1C: No results for input(s): HGBA1C in the last 72 hours. CBG: No results for input(s): GLUCAP in the last 168 hours. Lipid Profile: No results for input(s): CHOL, HDL, LDLCALC, TRIG, CHOLHDL, LDLDIRECT in the last 72 hours. Thyroid Function Tests: No results for input(s): TSH, T4TOTAL, FREET4, T3FREE, THYROIDAB in the last 72 hours. Anemia Panel: No results for input(s): VITAMINB12, FOLATE, FERRITIN, TIBC, IRON, RETICCTPCT in the last 72 hours. Sepsis Labs: Recent Labs  Lab 12/15/19 0424 12/16/19 0803 12/17/19 0515 12/20/19 0553  LATICACIDVEN 2.5* 1.6 1.3 1.2    Recent Results (from the past 240 hour(s))  Culture, blood (routine x 2)     Status: None   Collection Time: 12/15/19 12:37 PM   Specimen: BLOOD LEFT HAND  Result Value Ref Range Status   Specimen Description BLOOD LEFT HAND  Final   Special Requests   Final    BOTTLES DRAWN AEROBIC AND ANAEROBIC Blood Culture adequate volume   Culture   Final    NO GROWTH 5 DAYS Performed at [facility], [address],  [place], [place] [address]    Report Status 12/20/2019 FINAL  Final  Culture, blood (routine x 2)     Status: None   Collection Time: 12/15/19 12:42 PM   Specimen: BLOOD LEFT HAND  Result Value Ref Range Status   Specimen Description BLOOD LEFT HAND  Final   Special Requests   Final    BOTTLES DRAWN AEROBIC AND ANAEROBIC Blood Culture adequate volume   Culture   Final    NO GROWTH 5 DAYS Performed at [facility], [place] [address] [place], [place] [address]    Report Status 12/20/2019 FINAL  Final   C Difficile Quick Screen w PCR reflex     Status: None   Collection Time: 12/17/19  7:00 PM  Result Value Ref Range Status   C Diff antigen NEGATIVE NEGATIVE Final   C Diff toxin NEGATIVE NEGATIVE Final   C Diff interpretation No C. difficile detected.  Final    Comment: Performed at [facility], [place] [address], [place], [place] [address]  SARS CORONAVIRUS 2 (TAT 6-24 HRS) Nasopharyngeal Nasopharyngeal Swab     Status: None   Collection Time: 12/20/19 11:45 AM   Specimen: Nasopharyngeal Swab  Result Value Ref Range Status   SARS Coronavirus 2 NEGATIVE NEGATIVE Final    Comment: (NOTE) SARS-CoV-2 target nucleic acids are NOT DETECTED. The SARS-CoV-2 RNA is generally detectable in upper and lower respiratory specimens during the acute phase of infection. Negative results do not preclude SARS-CoV-2 infection, do not rule out co-infections with other pathogens, and should not be used as the sole basis for treatment or other patient management decisions. Negative results must be combined with clinical observations, patient history, and epidemiological information. The expected result is Negative. Fact Sheet for Patients: SugarRoll.be Fact Sheet for Healthcare Providers: https://www.woods-mathews.com/ This test is not yet approved or cleared by the [place] FDA and  has been authorized for detection and/or diagnosis of SARS-CoV-2 by FDA under an Emergency Use Authorization (EUA). This EUA will remain  in effect (meaning this test can be used) for the duration of the COVID-19 declaration under Section 56 4(b)(1) of the Act, 21 U.S.C. section 360bbb-3(b)(1), unless the authorization is terminated or revoked sooner. Performed at [facility], [place] [address], [place], [place] [address]          Radiology Studies: CT ABDOMEN PELVIS WO CONTRAST  Result Date: 12/21/2019 CLINICAL DATA:  Acute respiratory failure.  COVID-19.  EXAM: CT CHEST, ABDOMEN AND PELVIS WITHOUT CONTRAST TECHNIQUE: Multidetector CT imaging of the chest, abdomen and pelvis was performed following the standard protocol without IV contrast. COMPARISON:  None. FINDINGS: CT CHEST FINDINGS Cardiovascular: Cardiomegaly. Aortic atherosclerosis and coronary artery calcifications. No aortic aneurysm. Mediastinum/Nodes: No mediastinal, hilar, or axillary adenopathy. 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 Lungs/Pleura: Large bilateral pleural effusions. Extensive airspace disease bilaterally. Out near complete opacification of the left lung related to atelectasis or consolidation and effusion. Musculoskeletal: Chest wall soft tissues are unremarkable. No acute bony abnormality. CT ABDOMEN PELVIS FINDINGS Hepatobiliary: No focal hepatic abnormality. Gallbladder unremarkable. Pancreas: No focal abnormality or ductal dilatation. Spleen: No focal abnormality.  Normal size. Adrenals/Urinary Tract: No adrenal abnormality. No focal renal abnormality. No stones or hydronephrosis. Urinary bladder is unremarkable. Foley catheter in place. Stomach/Bowel: Gastrostomy tube within the stomach. Appendix is normal. Postoperative changes in the colon with left lower quadrant ostomy. Stomach and small bowel decompressed. There is a moderate to large-sized left inguinal hernia containing  small bowel loops. No evidence of small-bowel obstruction. Vascular/Lymphatic: Heavily calcified aorta and iliac vessels. No aneurysm or adenopathy. Reproductive: Prostate calcifications. Other: Small amount of free fluid in the pelvis. Large bilateral hydroceles and scrotal wall edema. Edema throughout the subcutaneous soft tissues of the abdominal and pelvic wall. Musculoskeletal: No acute bony abnormality. IMPRESSION: Large bilateral pleural effusions. Severe bilateral airspace disease with near complete atelectasis or consolidation throughout the left lung. Cardiomegaly, aortic  atherosclerosis, coronary artery disease. Left inguinal hernia containing small bowel loops. No evidence of bowel obstruction. Left lower quadrant ostomy. Small amount of free fluid in the pelvis. Severe abdominal wall edema, scrotal wall edema and bilateral hydroceles. Electronically Signed   By: Rolm Baptise M.D.   On: 12/21/2019 02:06   CT CHEST WO CONTRAST  Result Date: 12/21/2019 CLINICAL DATA:  Acute respiratory failure.  COVID-19. EXAM: CT CHEST, ABDOMEN AND PELVIS WITHOUT CONTRAST TECHNIQUE: Multidetector CT imaging of the chest, abdomen and pelvis was performed following the standard protocol without IV contrast. COMPARISON:  None. FINDINGS: CT CHEST FINDINGS Cardiovascular: Cardiomegaly. Aortic atherosclerosis and coronary artery calcifications. No aortic aneurysm. Mediastinum/Nodes: No mediastinal, hilar, or axillary adenopathy. 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 Lungs/Pleura: Large bilateral pleural effusions. Extensive airspace disease bilaterally. Out near complete opacification of the left lung related to atelectasis or consolidation and effusion. Musculoskeletal: Chest wall soft tissues are unremarkable. No acute bony abnormality. CT ABDOMEN PELVIS FINDINGS Hepatobiliary: No focal hepatic abnormality. Gallbladder unremarkable. Pancreas: No focal abnormality or ductal dilatation. Spleen: No focal abnormality.  Normal size. Adrenals/Urinary Tract: No adrenal abnormality. No focal renal abnormality. No stones or hydronephrosis. Urinary bladder is unremarkable. Foley catheter in place. Stomach/Bowel: Gastrostomy tube within the stomach. Appendix is normal. Postoperative changes in the colon with left lower quadrant ostomy. Stomach and small bowel decompressed. There is a moderate to large-sized left inguinal hernia containing small bowel loops. No evidence of small-bowel obstruction. Vascular/Lymphatic: Heavily calcified aorta and iliac vessels. No aneurysm or adenopathy.  Reproductive: Prostate calcifications. Other: Small amount of free fluid in the pelvis. Large bilateral hydroceles and scrotal wall edema. Edema throughout the subcutaneous soft tissues of the abdominal and pelvic wall. Musculoskeletal: No acute bony abnormality. IMPRESSION: Large bilateral pleural effusions. Severe bilateral airspace disease with near complete atelectasis or consolidation throughout the left lung. Cardiomegaly, aortic atherosclerosis, coronary artery disease. Left inguinal hernia containing small bowel loops. No evidence of bowel obstruction. Left lower quadrant ostomy. Small amount of free fluid in the pelvis. Severe abdominal wall edema, scrotal wall edema and bilateral hydroceles. Electronically Signed   By: Rolm Baptise M.D.   On: 12/21/2019 02:06   DG Chest Port 1 View  Result Date: 12/21/2019 CLINICAL DATA:  Status post thoracentesis EXAM: PORTABLE CHEST 1 VIEW COMPARISON:  Chest CT December 21, 2019; chest radiograph December 17, 2019 FINDINGS: No pneumothorax. Tracheostomy catheter tip is 5.4 cm above the carina. Central catheter tip is at the cavoatrial junction. Left pleural effusion is significantly smaller after thoracentesis. There are fairly small pleural effusions bilaterally with apparent loculation on the right. There is ill-defined airspace opacity bilaterally with consolidation in the left base. Heart is normal in size and contour with pulmonary vascularity normal. There is aortic atherosclerosis. No adenopathy evident. No bone lesions. IMPRESSION: No pneumothorax. Left pleural effusion smaller after thoracentesis. There are residual pleural effusions bilaterally with ill-defined airspace opacity bilaterally. Question pneumonia versus edema. Both entities may be present concurrently. Heart size normal. Aortic Atherosclerosis (ICD10-I70.0). Tube and catheter positions  as described. Electronically Signed   By: Lowella Grip III M.D.   On: 12/21/2019 13:06   IR THORACENTESIS ASP  PLEURAL SPACE W/IMG GUIDE  Result Date: 12/21/2019 INDICATION: Shortness of breath. Left-sided pleural effusion. Request diagnostic and therapeutic thoracentesis. EXAM: ULTRASOUND GUIDED LEFT THORACENTESIS MEDICATIONS: None. COMPLICATIONS: None immediate. PROCEDURE: An ultrasound guided thoracentesis was thoroughly discussed with the patient and questions answered. The benefits, risks, alternatives and complications were also discussed. The patient understands and wishes to proceed with the procedure. Written consent was obtained. Ultrasound was performed to localize and mark an adequate pocket of fluid in the left chest. The area was then prepped and draped in the normal sterile fashion. 1% Lidocaine was used for local anesthesia. Under ultrasound guidance a 6 Fr Safe-T-Centesis catheter was introduced. Thoracentesis was performed. The catheter was removed and a dressing applied. FINDINGS: A total of approximately 1 L of slightly hazy, dark yellow fluid was removed. Samples were sent to the laboratory as requested by the clinical team. IMPRESSION: Successful ultrasound guided left thoracentesis yielding 1 L of pleural fluid. Read by: Ascencion Dike PA-C Electronically Signed   By: Lucrezia Europe M.D.   On: 12/21/2019 12:54        Scheduled Meds: Please see MAR   Yaakov Guthrie, MD  12/21/2019, 4:56 PM

## 2019-12-21 NOTE — Procedures (Signed)
PROCEDURE SUMMARY:  Successful US guided left thoracentesis. Yielded 1 L of slightly hazy dark yellow fluid. Pt tolerated procedure well. No immediate complications.  Specimen was sent for labs. CXR ordered.  EBL < 5 mL  Ascencion Dike PA-C 12/21/2019 12:10 PM

## 2019-12-21 NOTE — Progress Notes (Addendum)
Pulmonary Critical Care Medicine [place]   PULMONARY CRITICAL CARE SERVICE  PROGRESS NOTE  Date of Service: 12/21/2019  Shane Banks  ZDG:[id number]  DOB: December 25, 1945   DOA: 11/21/2019  Referring Physician: [name], MD  HPI: Shane Banks is a 74 y.o. male seen for follow up of Acute on Chronic Respiratory Failure.  Patient remains on full support ventilator currently on 65% FiO2 satting at this time no distress  Medications: Reviewed on Rounds  Physical Exam:  Vitals: Pulse 63 respirations 38 BP 161/81 O2 sat 99% temp 97.8  Ventilator Settings ventilator mode AC VC rate of 18 tidal volume 350 PEEP of 12 with an FiO2 of 65%  . General: Comfortable at this time . Eyes: Grossly normal lids, irises & conjunctiva . ENT: grossly tongue is normal . Neck: no obvious mass . Cardiovascular: S1 S2 normal no gallop . Respiratory: No rales or rhonchi noted . Abdomen: soft . Skin: no rash seen on limited exam . Musculoskeletal: not rigid . Psychiatric:unable to assess . Neurologic: no seizure no involuntary movements         Lab Data:   Basic Metabolic Panel: Recent Labs  Lab 12/15/19 0424 12/15/19 0424 12/15/19 2026 12/15/19 2026 12/16/19 0803 12/17/19 0515 12/18/19 5956 12/20/19 0553 12/21/19 1356  NA 129*   < > 127*   < > 129* 126* 125* 129* 132*  K 5.4*   < > 5.0   < > 4.5 4.8 5.0 4.9 4.1  CL 91*   < > 90*   < > 92* 89* 88* 92* 95*  CO2 23   < > 22   < > 25 23 21* 21* 26  GLUCOSE 164*   < > 88   < > 146* 187* 144* 122* 164*  BUN 107*   < > 123*   < > 101* 113* 134* 133* 88*  CREATININE 3.25*   < > 3.48*   < > 3.08* 3.35* 3.82* 3.51* 2.44*  CALCIUM 7.9*   < > 7.5*  7.5*   < > 7.6* 7.9* 8.2* 8.2* 8.7*  MG 2.0  --   --   --  2.0 1.9 2.0  --  1.7  PHOS 4.9*   < > 4.8*  --  4.3 5.3* 5.3* 4.8*  --    < > = values in this interval not displayed.    ABG: Recent Labs  Lab 12/15/19 1325 12/17/19 0643 12/17/19 1809  PHART 7.387 7.357 7.370   PCO2ART 43.6 46.4 42.9  PO2ART 123* 86.7 73.0*  HCO3 25.5 25.4 24.2  O2SAT 98.6 96.3 94.0    Liver Function Tests: Recent Labs  Lab 12/15/19 2026 12/16/19 0803 12/17/19 0515 12/18/19 0627 12/20/19 0553  ALBUMIN 1.4* 1.4* 1.5* 1.7* 1.4*   No results for input(s): LIPASE, AMYLASE in the last 168 hours. No results for input(s): AMMONIA in the last 168 hours.  CBC: Recent Labs  Lab 12/16/19 0803 12/17/19 0515 12/18/19 0627 12/20/19 0831 12/21/19 0524  WBC 22.7* 22.2* 30.4* 36.3* 31.0*  HGB 8.0* 8.1* 7.5* 6.8* 7.9*  HCT 24.7* 24.6* 22.7* 20.9* 24.6*  MCV 83.4 84.2 84.1 84.3 85.4  PLT 148* 142* 143* 111* 109*    Cardiac Enzymes: No results for input(s): CKTOTAL, CKMB, CKMBINDEX, TROPONINI in the last 168 hours.  BNP (last 3 results) No results for input(s): BNP in the last 8760 hours.  ProBNP (last 3 results) No results for input(s): PROBNP in the last 8760 hours.  Radiological Exams: CT  ABDOMEN PELVIS WO CONTRAST  Result Date: 12/21/2019 CLINICAL DATA:  Acute respiratory failure.  COVID-19. EXAM: CT CHEST, ABDOMEN AND PELVIS WITHOUT CONTRAST TECHNIQUE: Multidetector CT imaging of the chest, abdomen and pelvis was performed following the standard protocol without IV contrast. COMPARISON:  None. FINDINGS: CT CHEST FINDINGS Cardiovascular: Cardiomegaly. Aortic atherosclerosis and coronary artery calcifications. No aortic aneurysm. Mediastinum/Nodes: No mediastinal, hilar, or axillary adenopathy. 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 Lungs/Pleura: Large bilateral pleural effusions. Extensive airspace disease bilaterally. Out near complete opacification of the left lung related to atelectasis or consolidation and effusion. Musculoskeletal: Chest wall soft tissues are unremarkable. No acute bony abnormality. CT ABDOMEN PELVIS FINDINGS Hepatobiliary: No focal hepatic abnormality. Gallbladder unremarkable. Pancreas: No focal abnormality or ductal dilatation. Spleen:  No focal abnormality.  Normal size. Adrenals/Urinary Tract: No adrenal abnormality. No focal renal abnormality. No stones or hydronephrosis. Urinary bladder is unremarkable. Foley catheter in place. Stomach/Bowel: Gastrostomy tube within the stomach. Appendix is normal. Postoperative changes in the colon with left lower quadrant ostomy. Stomach and small bowel decompressed. There is a moderate to large-sized left inguinal hernia containing small bowel loops. No evidence of small-bowel obstruction. Vascular/Lymphatic: Heavily calcified aorta and iliac vessels. No aneurysm or adenopathy. Reproductive: Prostate calcifications. Other: Small amount of free fluid in the pelvis. Large bilateral hydroceles and scrotal wall edema. Edema throughout the subcutaneous soft tissues of the abdominal and pelvic wall. Musculoskeletal: No acute bony abnormality. IMPRESSION: Large bilateral pleural effusions. Severe bilateral airspace disease with near complete atelectasis or consolidation throughout the left lung. Cardiomegaly, aortic atherosclerosis, coronary artery disease. Left inguinal hernia containing small bowel loops. No evidence of bowel obstruction. Left lower quadrant ostomy. Small amount of free fluid in the pelvis. Severe abdominal wall edema, scrotal wall edema and bilateral hydroceles. Electronically Signed   By: Rolm Baptise M.D.   On: 12/21/2019 02:06   CT CHEST WO CONTRAST  Result Date: 12/21/2019 CLINICAL DATA:  Acute respiratory failure.  COVID-19. EXAM: CT CHEST, ABDOMEN AND PELVIS WITHOUT CONTRAST TECHNIQUE: Multidetector CT imaging of the chest, abdomen and pelvis was performed following the standard protocol without IV contrast. COMPARISON:  None. FINDINGS: CT CHEST FINDINGS Cardiovascular: Cardiomegaly. Aortic atherosclerosis and coronary artery calcifications. No aortic aneurysm. Mediastinum/Nodes: No mediastinal, hilar, or axillary adenopathy. 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  Lungs/Pleura: Large bilateral pleural effusions. Extensive airspace disease bilaterally. Out near complete opacification of the left lung related to atelectasis or consolidation and effusion. Musculoskeletal: Chest wall soft tissues are unremarkable. No acute bony abnormality. CT ABDOMEN PELVIS FINDINGS Hepatobiliary: No focal hepatic abnormality. Gallbladder unremarkable. Pancreas: No focal abnormality or ductal dilatation. Spleen: No focal abnormality.  Normal size. Adrenals/Urinary Tract: No adrenal abnormality. No focal renal abnormality. No stones or hydronephrosis. Urinary bladder is unremarkable. Foley catheter in place. Stomach/Bowel: Gastrostomy tube within the stomach. Appendix is normal. Postoperative changes in the colon with left lower quadrant ostomy. Stomach and small bowel decompressed. There is a moderate to large-sized left inguinal hernia containing small bowel loops. No evidence of small-bowel obstruction. Vascular/Lymphatic: Heavily calcified aorta and iliac vessels. No aneurysm or adenopathy. Reproductive: Prostate calcifications. Other: Small amount of free fluid in the pelvis. Large bilateral hydroceles and scrotal wall edema. Edema throughout the subcutaneous soft tissues of the abdominal and pelvic wall. Musculoskeletal: No acute bony abnormality. IMPRESSION: Large bilateral pleural effusions. Severe bilateral airspace disease with near complete atelectasis or consolidation throughout the left lung. Cardiomegaly, aortic atherosclerosis, coronary artery disease. Left inguinal hernia containing  small bowel loops. No evidence of bowel obstruction. Left lower quadrant ostomy. Small amount of free fluid in the pelvis. Severe abdominal wall edema, scrotal wall edema and bilateral hydroceles. Electronically Signed   By: Rolm Baptise M.D.   On: 12/21/2019 02:06   DG Chest Port 1 View  Result Date: 12/21/2019 CLINICAL DATA:  Status post thoracentesis EXAM: PORTABLE CHEST 1 VIEW COMPARISON:   Chest CT December 21, 2019; chest radiograph December 17, 2019 FINDINGS: No pneumothorax. Tracheostomy catheter tip is 5.4 cm above the carina. Central catheter tip is at the cavoatrial junction. Left pleural effusion is significantly smaller after thoracentesis. There are fairly small pleural effusions bilaterally with apparent loculation on the right. There is ill-defined airspace opacity bilaterally with consolidation in the left base. Heart is normal in size and contour with pulmonary vascularity normal. There is aortic atherosclerosis. No adenopathy evident. No bone lesions. IMPRESSION: No pneumothorax. Left pleural effusion smaller after thoracentesis. There are residual pleural effusions bilaterally with ill-defined airspace opacity bilaterally. Question pneumonia versus edema. Both entities may be present concurrently. Heart size normal. Aortic Atherosclerosis (ICD10-I70.0). Tube and catheter positions as described. Electronically Signed   By: Lowella Grip III M.D.   On: 12/21/2019 13:06   IR THORACENTESIS ASP PLEURAL SPACE W/IMG GUIDE  Result Date: 12/21/2019 INDICATION: Shortness of breath. Left-sided pleural effusion. Request diagnostic and therapeutic thoracentesis. EXAM: ULTRASOUND GUIDED LEFT THORACENTESIS MEDICATIONS: None. COMPLICATIONS: None immediate. PROCEDURE: An ultrasound guided thoracentesis was thoroughly discussed with the patient and questions answered. The benefits, risks, alternatives and complications were also discussed. The patient understands and wishes to proceed with the procedure. Written consent was obtained. Ultrasound was performed to localize and mark an adequate pocket of fluid in the left chest. The area was then prepped and draped in the normal sterile fashion. 1% Lidocaine was used for local anesthesia. Under ultrasound guidance a 6 Fr Safe-T-Centesis catheter was introduced. Thoracentesis was performed. The catheter was removed and a dressing applied. FINDINGS: A total  of approximately 1 L of slightly hazy, dark yellow fluid was removed. Samples were sent to the laboratory as requested by the clinical team. IMPRESSION: Successful ultrasound guided left thoracentesis yielding 1 L of pleural fluid. Read by: Ascencion Dike PA-C Electronically Signed   By: Lucrezia Europe M.D.   On: 12/21/2019 12:54    Assessment/Plan Active Problems:   Acute on chronic respiratory failure with hypoxia (HCC)   COVID-19 virus infection   Atrial fibrillation with RVR (HCC)   Severe sepsis (HCC)   Pneumonia due to COVID-19 virus   1. Acute on chronic respiratory failure hypoxia continue present pulmonary toilet supportive measures.  Continue to attempt weaning as tolerated. 2. COVID-19 virus infection in resolution phase 3. Atrial fibrillation rate now rate controlled 4. Severe sepsis hemodynamics are stable 5. Pneumonia due to COVID-19 treated we will continue to monitor   I have personally seen and evaluated the patient, evaluated laboratory and imaging results, formulated the assessment and plan and placed orders. The Patient requires high complexity decision making with multiple systems involvement.  Rounds were done with the Respiratory Therapy Director and Staff therapists and discussed with nursing staff also.  Allyne Gee, MD [facility] Pulmonary Critical Care Medicine Sleep Medicine

## 2019-12-21 NOTE — Consult Note (Signed)
Ref: [name], MD   Subjective:  Resting. He had Left thoracentesis with recovery of 1 L of fluid.  Objective:  Vital Signs in the last 24 hours:  P: 61, R: 28, BP: 120/60, O2 sat 97 % on 60 % FiO2, PEEP 12.  Physical Exam: BP Readings from Last 1 Encounters:  No data found for BP     Wt Readings from Last 1 Encounters:  No data found for Wt    Weight change:  There is no height or weight on file to calculate BMI. HEENT: [place]/AT, Eyes-Brown, Conjunctiva-Pale, Sclera-Non-icteric Neck: No JVD, No bruit, Trachea midline. Lungs:  Clearing, Bilateral. Cardiac:  Regular rhythm, normal S1 and S2, no S3. II/VI systolic murmur. Abdomen:  Soft, non-tender. BS present. Extremities:  2 + edema present. No cyanosis. No clubbing. Scrotal edema persist. CNS: AxOx0, Cranial nerves grossly intact. Skin: Warm and dry.   Intake/Output from previous day: No intake/output data recorded.    Lab Results: BMET    Component Value Date/Time   NA 132 (L) 12/21/2019 1356   NA 129 (L) 12/20/2019 0553   NA 125 (L) 12/18/2019 0627   K 4.1 12/21/2019 1356   K 4.9 12/20/2019 0553   K 5.0 12/18/2019 0627   CL 95 (L) 12/21/2019 1356   CL 92 (L) 12/20/2019 0553   CL 88 (L) 12/18/2019 0627   CO2 26 12/21/2019 1356   CO2 21 (L) 12/20/2019 0553   CO2 21 (L) 12/18/2019 0627   GLUCOSE 164 (H) 12/21/2019 1356   GLUCOSE 122 (H) 12/20/2019 0553   GLUCOSE 144 (H) 12/18/2019 0627   BUN 88 (H) 12/21/2019 1356   BUN 133 (H) 12/20/2019 0553   BUN 134 (H) 12/18/2019 0627   CREATININE 2.44 (H) 12/21/2019 1356   CREATININE 3.51 (H) 12/20/2019 0553   CREATININE 3.82 (H) 12/18/2019 0627   CALCIUM 8.7 (L) 12/21/2019 1356   CALCIUM 8.2 (L) 12/20/2019 0553   CALCIUM 8.2 (L) 12/18/2019 0627   CALCIUM 7.5 (L) 12/15/2019 2026   GFRNONAA 25 (L) 12/21/2019 1356   GFRNONAA 16 (L) 12/20/2019 0553   GFRNONAA 15 (L) 12/18/2019 0627   GFRAA 30 (L) 12/21/2019 1356   GFRAA 19 (L) 12/20/2019 0553   GFRAA 17 (L)  12/18/2019 0627   CBC    Component Value Date/Time   WBC 31.0 (H) 12/21/2019 0524   RBC 2.88 (L) 12/21/2019 0524   HGB 7.9 (L) 12/21/2019 0524   HCT 24.6 (L) 12/21/2019 0524   PLT 109 (L) 12/21/2019 0524   MCV 85.4 12/21/2019 0524   MCH 27.4 12/21/2019 0524   MCHC 32.1 12/21/2019 0524   RDW 19.9 (H) 12/21/2019 0524   HEPATIC Function Panel Recent Labs    11/22/19 0617  PROT 5.1*   HEMOGLOBIN A1C No components found for: HGA1C,  MPG CARDIAC ENZYMES No results found for: CKTOTAL, CKMB, CKMBINDEX, TROPONINI BNP No results for input(s): PROBNP in the last 8760 hours. TSH Recent Labs    11/22/19 0617 11/30/19 0656  TSH 7.162* 7.130*   CHOLESTEROL No results for input(s): CHOL in the last 8760 hours.  Scheduled Meds: . 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

## 2019-12-22 ENCOUNTER — Other Ambulatory Visit (HOSPITAL_COMMUNITY): Payer: Medicare HMO

## 2019-12-22 DIAGNOSIS — I4891 Unspecified atrial fibrillation: Secondary | ICD-10-CM | POA: Diagnosis not present

## 2019-12-22 DIAGNOSIS — N179 Acute kidney failure, unspecified: Secondary | ICD-10-CM | POA: Diagnosis not present

## 2019-12-22 DIAGNOSIS — J9621 Acute and chronic respiratory failure with hypoxia: Secondary | ICD-10-CM | POA: Diagnosis not present

## 2019-12-22 DIAGNOSIS — U071 COVID-19: Secondary | ICD-10-CM | POA: Diagnosis not present

## 2019-12-22 LAB — PH, BODY FLUID: pH, Body Fluid: 7.5

## 2019-12-22 NOTE — Progress Notes (Signed)
[facility]  ROUNDING NOTE   Subjective:  Patient seen and evaluated during dialysis treatment today. Tolerating well thus far.     Objective:  Vital signs in last 24 hours:  Temperature 96.6 pulse 64 respirations 32 blood pressure 156/68  Physical Exam: General: Critically ill-appearing  Head: Normocephalic, atraumatic. Moist oral mucosal membranes  Eyes: Anicteric  Neck: Tracheostomy in place  Lungs:  Scattered rhonchi, vent assisted  Heart: S1S2 no rubs  Abdomen:  Soft, nontender, bowel sounds present, PEG tube in place  Extremities: 2+ peripheral edema.  Neurologic: Not following commands  Skin: No rash  Access: Right IJ temporary dialysis catheter    Basic Metabolic Panel: Recent Labs  Lab 12/15/19 2026 12/15/19 2026 12/16/19 0803 12/16/19 0803 12/17/19 0515 12/17/19 0515 12/18/19 1962 12/20/19 0553 12/21/19 1356  NA 127*   < > 129*  --  126*  --  125* 129* 132*  K 5.0   < > 4.5  --  4.8  --  5.0 4.9 4.1  CL 90*   < > 92*  --  89*  --  88* 92* 95*  CO2 22   < > 25  --  23  --  21* 21* 26  GLUCOSE 88   < > 146*  --  187*  --  144* 122* 164*  BUN 123*   < > 101*  --  113*  --  134* 133* 88*  CREATININE 3.48*   < > 3.08*  --  3.35*  --  3.82* 3.51* 2.44*  CALCIUM 7.5*  7.5*   < > 7.6*   < > 7.9*   < > 8.2* 8.2* 8.7*  MG  --   --  2.0  --  1.9  --  2.0  --  1.7  PHOS 4.8*  --  4.3  --  5.3*  --  5.3* 4.8*  --    < > = values in this interval not displayed.    Liver Function Tests: Recent Labs  Lab 12/15/19 2026 12/16/19 0803 12/17/19 0515 12/18/19 0627 12/20/19 0553  ALBUMIN 1.4* 1.4* 1.5* 1.7* 1.4*   No results for input(s): LIPASE, AMYLASE in the last 168 hours. No results for input(s): AMMONIA in the last 168 hours.  CBC: Recent Labs  Lab 12/16/19 0803 12/17/19 0515 12/18/19 0627 12/20/19 0831 12/21/19 0524  WBC 22.7* 22.2* 30.4* 36.3* 31.0*  HGB 8.0* 8.1* 7.5* 6.8* 7.9*  HCT 24.7* 24.6* 22.7* 20.9* 24.6*  MCV 83.4 84.2  84.1 84.3 85.4  PLT 148* 142* 143* 111* 109*    Cardiac Enzymes: No results for input(s): CKTOTAL, CKMB, CKMBINDEX, TROPONINI in the last 168 hours.  BNP: Invalid input(s): POCBNP  CBG: No results for input(s): GLUCAP in the last 168 hours.  Microbiology: Results for orders placed or performed during the hospital encounter of 11/21/19  Culture, Urine     Status: Abnormal   Collection Time: 11/22/19  1:25 PM   Specimen: Urine, Random  Result Value Ref Range Status   Specimen Description URINE, RANDOM  Final   Special Requests   Final    NONE Performed at [facility], [address] [place], [place] [address]    Culture 50,000 COLONIES/mL YEAST (A)  Final   Report Status 11/23/2019 FINAL  Final  Culture, blood (routine x 2)     Status: None   Collection Time: 11/22/19  1:40 PM   Specimen: BLOOD LEFT HAND  Result Value Ref Range Status  Specimen Description BLOOD LEFT HAND  Final   Special Requests   Final    BOTTLES DRAWN AEROBIC ONLY Blood Culture results may not be optimal due to an inadequate volume of blood received in culture bottles   Culture   Final    NO GROWTH 5 DAYS Performed at [facility], [place] [address], [place], [place] [address]    Report Status 11/27/2019 FINAL  Final  Culture, blood (routine x 2)     Status: None   Collection Time: 11/22/19  1:44 PM   Specimen: BLOOD LEFT HAND  Result Value Ref Range Status   Specimen Description BLOOD LEFT HAND  Final   Special Requests   Final    BOTTLES DRAWN AEROBIC ONLY Blood Culture results may not be optimal due to an inadequate volume of blood received in culture bottles   Culture   Final    NO GROWTH 5 DAYS Performed at [facility], [place] [address] [place], [place] [address]    Report Status 11/27/2019 FINAL  Final  Culture, respiratory (non-expectorated)     Status: None   Collection Time: 11/22/19  4:59 PM   Specimen: Tracheal Aspirate; Respiratory  Result Value Ref Range Status    Specimen Description TRACHEAL ASPIRATE  Final   Special Requests NONE  Final   Gram Stain   Final    ABUNDANT WBC PRESENT, PREDOMINANTLY PMN NO ORGANISMS SEEN    Culture   Final    NO GROWTH 2 DAYS Performed at [facility], [address] [place], [place] [address]    Report Status 11/24/2019 FINAL  Final  Culture, respiratory (non-expectorated)     Status: None   Collection Time: 12/04/19  2:22 AM   Specimen: Tracheal Aspirate; Respiratory  Result Value Ref Range Status   Specimen Description TRACHEAL ASPIRATE  Final   Special Requests NONE  Final   Gram Stain   Final    ABUNDANT WBC PRESENT, PREDOMINANTLY PMN ABUNDANT GRAM NEGATIVE RODS Performed at [facility], [address], [place], [place] [address]    Culture   Final    MODERATE PSEUDOMONAS AERUGINOSA FEW ESCHERICHIA COLI Confirmed Extended Spectrum Beta-Lactamase Producer (ESBL).  In bloodstream infections from ESBL organisms, carbapenems are preferred over piperacillin/tazobactam. They are shown to have a lower risk of mortality.    Report Status 12/09/2019 FINAL  Final   Organism ID, Bacteria PSEUDOMONAS AERUGINOSA  Final   Organism ID, Bacteria ESCHERICHIA COLI  Final      Susceptibility   Escherichia coli - MIC*    AMPICILLIN >=32 RESISTANT Resistant     CEFAZOLIN >=64 RESISTANT Resistant     CEFEPIME 2 SENSITIVE Sensitive     CEFTAZIDIME RESISTANT Resistant     CEFTRIAXONE >=64 RESISTANT Resistant     CIPROFLOXACIN <=0.25 SENSITIVE Sensitive     GENTAMICIN >=16 RESISTANT Resistant     IMIPENEM <=0.25 SENSITIVE Sensitive     TRIMETH/SULFA >=320 RESISTANT Resistant     AMPICILLIN/SULBACTAM 16 INTERMEDIATE Intermediate     PIP/TAZO <=4 SENSITIVE Sensitive     * FEW ESCHERICHIA COLI   Pseudomonas aeruginosa - MIC*    CEFTAZIDIME 16 INTERMEDIATE Intermediate     CIPROFLOXACIN >=4 RESISTANT Resistant     GENTAMICIN 2 SENSITIVE Sensitive     IMIPENEM >=16 RESISTANT Resistant     * MODERATE  PSEUDOMONAS AERUGINOSA  Culture, Urine     Status: Abnormal   Collection Time: 12/04/19  6:33 PM  Specimen: Urine, Random  Result Value Ref Range Status   Specimen Description URINE, RANDOM  Final   Special Requests   Final    NONE Performed at [facility], [address] [place], [place] [address]    Culture >=100,000 COLONIES/mL PSEUDOMONAS AERUGINOSA (A)  Final   Report Status 12/06/2019 FINAL  Final   Organism ID, Bacteria PSEUDOMONAS AERUGINOSA (A)  Final      Susceptibility   Pseudomonas aeruginosa - MIC*    CEFTAZIDIME 4 SENSITIVE Sensitive     CIPROFLOXACIN >=4 RESISTANT Resistant     GENTAMICIN 4 SENSITIVE Sensitive     IMIPENEM 2 SENSITIVE Sensitive     PIP/TAZO 16 SENSITIVE Sensitive     * >=100,000 COLONIES/mL PSEUDOMONAS AERUGINOSA  Culture, blood (routine x 2)     Status: Abnormal   Collection Time: 12/11/19  9:39 AM   Specimen: BLOOD LEFT HAND  Result Value Ref Range Status   Specimen Description BLOOD LEFT HAND  Final   Special Requests   Final    BOTTLES DRAWN AEROBIC AND ANAEROBIC Blood Culture adequate volume   Culture  Setup Time   Final    GRAM POSITIVE COCCI IN CLUSTERS ANAEROBIC BOTTLE ONLY CRITICAL RESULT CALLED TO, READ BACK BY AND VERIFIED WITH: RN R CUMMICNGS [id number] AT 48 BY CM    Culture (A)  Final    STAPHYLOCOCCUS SPECIES (COAGULASE NEGATIVE) THE SIGNIFICANCE OF ISOLATING THIS ORGANISM FROM A SINGLE SET OF BLOOD CULTURES WHEN MULTIPLE SETS ARE DRAWN IS UNCERTAIN. PLEASE NOTIFY THE MICROBIOLOGY DEPARTMENT WITHIN ONE WEEK IF SPECIATION AND SENSITIVITIES ARE REQUIRED. Performed at [facility], [place] [address] [place], [place] [address]    Report Status 12/14/2019 FINAL  Final  Culture, blood (routine x 2)     Status: None   Collection Time: 12/11/19  9:47 AM   Specimen: BLOOD  Result Value Ref Range Status   Specimen Description BLOOD RIGHT ANTECUBITAL  Final   Special Requests   Final    BOTTLES DRAWN AEROBIC AND ANAEROBIC  Blood Culture adequate volume   Culture  Setup Time   Final    CORRECTED RESULTS NO ORGANISMS SEEN PREVIOUSLY REPORTED AS: GRAM POSITIVE COCCI IN CLUSTERS CORRECTED RESULTS CALLED TO: RN R CUMMINGS [id number] AT 920 AM BY CM    Culture   Final    NO GROWTH 5 DAYS Performed at [facility], [place] [address], [place], [place] [address]    Report Status 12/16/2019 FINAL  Final  Culture, respiratory (non-expectorated)     Status: None (Preliminary result)   Collection Time: 12/11/19 10:27 AM   Specimen: Tracheal Aspirate; Respiratory  Result Value Ref Range Status   Specimen Description TRACHEAL ASPIRATE  Final   Special Requests   Final    NONE Performed at [facility], [place] [address] [place], [place] [address]    Gram Stain NO WBC SEEN NO ORGANISMS SEEN   Final   Culture   Final    FEW PSEUDOMONAS AERUGINOSA FEW STENOTROPHOMONAS MALTOPHILIA PSEUDOMONAS AERUGINOSA Sent to [place] for further susceptibility testing.    Report Status PENDING  Incomplete   Organism ID, Bacteria PSEUDOMONAS AERUGINOSA  Final   Organism ID, Bacteria STENOTROPHOMONAS MALTOPHILIA  Final      Susceptibility   Pseudomonas aeruginosa - MIC*    CEFTAZIDIME 16 INTERMEDIATE Intermediate     CIPROFLOXACIN >=4 RESISTANT Resistant     GENTAMICIN 2 SENSITIVE Sensitive     IMIPENEM >=16 RESISTANT  Resistant     * FEW PSEUDOMONAS AERUGINOSA   Stenotrophomonas maltophilia - MIC*    LEVOFLOXACIN 0.5 SENSITIVE Sensitive     TRIMETH/SULFA <=20 SENSITIVE Sensitive     * FEW STENOTROPHOMONAS MALTOPHILIA  Culture, Urine     Status: Abnormal   Collection Time: 12/11/19 11:30 AM   Specimen: Urine, Random  Result Value Ref Range Status   Specimen Description URINE, RANDOM  Final   Special Requests NONE  Final   Culture (A)  Final    <10,000 COLONIES/mL INSIGNIFICANT GROWTH Performed at [facility], [place] [address] [place], [place] [address]    Report Status 12/12/2019 FINAL  Final  Culture, blood  (routine x 2)     Status: None   Collection Time: 12/15/19 12:37 PM   Specimen: BLOOD LEFT HAND  Result Value Ref Range Status   Specimen Description BLOOD LEFT HAND  Final   Special Requests   Final    BOTTLES DRAWN AEROBIC AND ANAEROBIC Blood Culture adequate volume   Culture   Final    NO GROWTH 5 DAYS Performed at [facility], [place] [address] [place], [place] [address]    Report Status 12/20/2019 FINAL  Final  Culture, blood (routine x 2)     Status: None   Collection Time: 12/15/19 12:42 PM   Specimen: BLOOD LEFT HAND  Result Value Ref Range Status   Specimen Description BLOOD LEFT HAND  Final   Special Requests   Final    BOTTLES DRAWN AEROBIC AND ANAEROBIC Blood Culture adequate volume   Culture   Final    NO GROWTH 5 DAYS Performed at [facility], [place] [address], [place], [place] [address]    Report Status 12/20/2019 FINAL  Final  C Difficile Quick Screen w PCR reflex     Status: None   Collection Time: 12/17/19  7:00 PM  Result Value Ref Range Status   C Diff antigen NEGATIVE NEGATIVE Final   C Diff toxin NEGATIVE NEGATIVE Final   C Diff interpretation No C. difficile detected.  Final    Comment: Performed at [facility], [place] [address], [place], [place] [address]  SARS CORONAVIRUS 2 (TAT 6-24 HRS) Nasopharyngeal Nasopharyngeal Swab     Status: None   Collection Time: 12/20/19 11:45 AM   Specimen: Nasopharyngeal Swab  Result Value Ref Range Status   SARS Coronavirus 2 NEGATIVE NEGATIVE Final    Comment: (NOTE) SARS-CoV-2 target nucleic acids are NOT DETECTED. The SARS-CoV-2 RNA is generally detectable in upper and lower respiratory specimens during the acute phase of infection. Negative results do not preclude SARS-CoV-2 infection, do not rule out co-infections with other pathogens, and should not be used as the sole basis for treatment or other patient management decisions. Negative results must be combined with clinical observations, patient  history, and epidemiological information. The expected result is Negative. Fact Sheet for Patients: SugarRoll.be Fact Sheet for Healthcare Providers: https://www.woods-mathews.com/ This test is not yet approved or cleared by the [place] FDA and  has been authorized for detection and/or diagnosis of SARS-CoV-2 by FDA under an Emergency Use Authorization (EUA). This EUA will remain  in effect (meaning this test can be used) for the duration of the COVID-19 declaration under Section 56 4(b)(1) of the Act, 21 U.S.C. section 360bbb-3(b)(1), unless the authorization is terminated or revoked sooner. Performed at [facility], [place] [address], [place], [place] [address]   Body fluid culture  Status: None (Preliminary result)   Collection Time: 12/21/19 12:10 PM   Specimen: Lung, Left; Pleural Fluid  Result Value Ref Range Status   Specimen Description PLEURAL FLUID  Final   Special Requests LEFT LUNG THORA  Final   Gram Stain   Final    RARE WBC PRESENT, PREDOMINANTLY MONONUCLEAR NO ORGANISMS SEEN Performed at [facility], [address] [place], [place] [address]    Culture NO GROWTH < 24 HOURS  Final   Report Status PENDING  Incomplete    Coagulation Studies: No results for input(s): LABPROT, INR in the last 72 hours.  Urinalysis: No results for input(s): COLORURINE, LABSPEC, PHURINE, GLUCOSEU, HGBUR, BILIRUBINUR, KETONESUR, PROTEINUR, UROBILINOGEN, NITRITE, LEUKOCYTESUR in the last 72 hours.  Invalid input(s): APPERANCEUR    Imaging: CT ABDOMEN PELVIS WO CONTRAST  Result Date: 12/21/2019 CLINICAL DATA:  Acute respiratory failure.  COVID-19. EXAM: CT CHEST, ABDOMEN AND PELVIS WITHOUT CONTRAST TECHNIQUE: Multidetector CT imaging of the chest, abdomen and pelvis was performed following the standard protocol without IV contrast. COMPARISON:  None. FINDINGS: CT CHEST FINDINGS Cardiovascular: Cardiomegaly. Aortic  atherosclerosis and coronary artery calcifications. No aortic aneurysm. Mediastinum/Nodes: No mediastinal, hilar, or axillary adenopathy. 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 Lungs/Pleura: Large bilateral pleural effusions. Extensive airspace disease bilaterally. Out near complete opacification of the left lung related to atelectasis or consolidation and effusion. Musculoskeletal: Chest wall soft tissues are unremarkable. No acute bony abnormality. CT ABDOMEN PELVIS FINDINGS Hepatobiliary: No focal hepatic abnormality. Gallbladder unremarkable. Pancreas: No focal abnormality or ductal dilatation. Spleen: No focal abnormality.  Normal size. Adrenals/Urinary Tract: No adrenal abnormality. No focal renal abnormality. No stones or hydronephrosis. Urinary bladder is unremarkable. Foley catheter in place. Stomach/Bowel: Gastrostomy tube within the stomach. Appendix is normal. Postoperative changes in the colon with left lower quadrant ostomy. Stomach and small bowel decompressed. There is a moderate to large-sized left inguinal hernia containing small bowel loops. No evidence of small-bowel obstruction. Vascular/Lymphatic: Heavily calcified aorta and iliac vessels. No aneurysm or adenopathy. Reproductive: Prostate calcifications. Other: Small amount of free fluid in the pelvis. Large bilateral hydroceles and scrotal wall edema. Edema throughout the subcutaneous soft tissues of the abdominal and pelvic wall. Musculoskeletal: No acute bony abnormality. IMPRESSION: Large bilateral pleural effusions. Severe bilateral airspace disease with near complete atelectasis or consolidation throughout the left lung. Cardiomegaly, aortic atherosclerosis, coronary artery disease. Left inguinal hernia containing small bowel loops. No evidence of bowel obstruction. Left lower quadrant ostomy. Small amount of free fluid in the pelvis. Severe abdominal wall edema, scrotal wall edema and bilateral hydroceles. Electronically  Signed   By: Rolm Baptise M.D.   On: 12/21/2019 02:06   CT CHEST WO CONTRAST  Result Date: 12/21/2019 CLINICAL DATA:  Acute respiratory failure.  COVID-19. EXAM: CT CHEST, ABDOMEN AND PELVIS WITHOUT CONTRAST TECHNIQUE: Multidetector CT imaging of the chest, abdomen and pelvis was performed following the standard protocol without IV contrast. COMPARISON:  None. FINDINGS: CT CHEST FINDINGS Cardiovascular: Cardiomegaly. Aortic atherosclerosis and coronary artery calcifications. No aortic aneurysm. Mediastinum/Nodes: No mediastinal, hilar, or axillary adenopathy. 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 Lungs/Pleura: Large bilateral pleural effusions. Extensive airspace disease bilaterally. Out near complete opacification of the left lung related to atelectasis or consolidation and effusion. Musculoskeletal: Chest wall soft tissues are unremarkable. No acute bony abnormality. CT ABDOMEN PELVIS FINDINGS Hepatobiliary: No focal hepatic abnormality. Gallbladder unremarkable. Pancreas: No focal abnormality or ductal dilatation. Spleen: No focal abnormality.  Normal size. Adrenals/Urinary Tract:  No adrenal abnormality. No focal renal abnormality. No stones or hydronephrosis. Urinary bladder is unremarkable. Foley catheter in place. Stomach/Bowel: Gastrostomy tube within the stomach. Appendix is normal. Postoperative changes in the colon with left lower quadrant ostomy. Stomach and small bowel decompressed. There is a moderate to large-sized left inguinal hernia containing small bowel loops. No evidence of small-bowel obstruction. Vascular/Lymphatic: Heavily calcified aorta and iliac vessels. No aneurysm or adenopathy. Reproductive: Prostate calcifications. Other: Small amount of free fluid in the pelvis. Large bilateral hydroceles and scrotal wall edema. Edema throughout the subcutaneous soft tissues of the abdominal and pelvic wall. Musculoskeletal: No acute bony abnormality. IMPRESSION: Large bilateral  pleural effusions. Severe bilateral airspace disease with near complete atelectasis or consolidation throughout the left lung. Cardiomegaly, aortic atherosclerosis, coronary artery disease. Left inguinal hernia containing small bowel loops. No evidence of bowel obstruction. Left lower quadrant ostomy. Small amount of free fluid in the pelvis. Severe abdominal wall edema, scrotal wall edema and bilateral hydroceles. Electronically Signed   By: Rolm Baptise M.D.   On: 12/21/2019 02:06   DG CHEST PORT 1 VIEW  Result Date: 12/22/2019 CLINICAL DATA:  Pneumonia.  Respiratory distress. EXAM: PORTABLE CHEST 1 VIEW COMPARISON:  12/21/2019. FINDINGS: Tracheostomy tube in stable position. Right IJ line in unchanged position with tip over right atrium. Heart size stable. Low lung volumes. Diffuse bilateral pulmonary infiltrates/edema. Bilateral pleural effusions unchanged. No pneumothorax. IMPRESSION: 1.  Lines and tubes in stable position. 2. Low lung volumes. Diffuse bilateral pulmonary infiltrates/edema. Small bilateral pleural effusions. Electronically Signed   By: Marcello Moores  Register   On: 12/22/2019 06:56   DG Chest Port 1 View  Result Date: 12/21/2019 CLINICAL DATA:  Status post thoracentesis EXAM: PORTABLE CHEST 1 VIEW COMPARISON:  Chest CT December 21, 2019; chest radiograph December 17, 2019 FINDINGS: No pneumothorax. Tracheostomy catheter tip is 5.4 cm above the carina. Central catheter tip is at the cavoatrial junction. Left pleural effusion is significantly smaller after thoracentesis. There are fairly small pleural effusions bilaterally with apparent loculation on the right. There is ill-defined airspace opacity bilaterally with consolidation in the left base. Heart is normal in size and contour with pulmonary vascularity normal. There is aortic atherosclerosis. No adenopathy evident. No bone lesions. IMPRESSION: No pneumothorax. Left pleural effusion smaller after thoracentesis. There are residual pleural  effusions bilaterally with ill-defined airspace opacity bilaterally. Question pneumonia versus edema. Both entities may be present concurrently. Heart size normal. Aortic Atherosclerosis (ICD10-I70.0). Tube and catheter positions as described. Electronically Signed   By: Lowella Grip III M.D.   On: 12/21/2019 13:06   IR THORACENTESIS ASP PLEURAL SPACE W/IMG GUIDE  Result Date: 12/21/2019 INDICATION: Shortness of breath. Left-sided pleural effusion. Request diagnostic and therapeutic thoracentesis. EXAM: ULTRASOUND GUIDED LEFT THORACENTESIS MEDICATIONS: None. COMPLICATIONS: None immediate. PROCEDURE: An ultrasound guided thoracentesis was thoroughly discussed with the patient and questions answered. The benefits, risks, alternatives and complications were also discussed. The patient understands and wishes to proceed with the procedure. Written consent was obtained. Ultrasound was performed to localize and mark an adequate pocket of fluid in the left chest. The area was then prepped and draped in the normal sterile fashion. 1% Lidocaine was used for local anesthesia. Under ultrasound guidance a 6 Fr Safe-T-Centesis catheter was introduced. Thoracentesis was performed. The catheter was removed and a dressing applied. FINDINGS: A total of approximately 1 L of slightly hazy, dark yellow fluid was removed. Samples were sent to the laboratory as requested by the clinical team. IMPRESSION: Successful ultrasound guided  left thoracentesis yielding 1 L of pleural fluid. Read by: Ascencion Dike PA-C Electronically Signed   By: Lucrezia Europe M.D.   On: 12/21/2019 12:54     Medications:     heparin, lidocaine, lidocaine  Assessment/ Plan:  74 y.o. male with a PMHx of acute respiratory failure status post COVID-19 infection status post tracheostomy placement, atrial fibrillation, hypertension, CVA, gout, hypertrophic cardiomyopathy, who was admitted to [facility] on 11/21/2019 for ongoing treatment of  acute respiratory failure.  1.  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  Azotemia has significantly improved.  BUN down to 88 with a creatinine of 2.4.  We will complete hemodialysis today.  Patient seen and evaluated during dialysis treatment.  2.  Acute respiratory failure secondary to COVID-19 infection and its sequela.  -Patient remains on the ventilator.  Continue vent support at this time.  3.  Hyperkalemia.  Potassium normalized to 4.1.  Continue to monitor.  4.  Hyponatremia.  Serum sodium now up to 132 with dialysis treatments.     LOS: 0 Jayston Trevino 3/26/20218:33 AM

## 2019-12-23 LAB — MAGNESIUM: Magnesium: 2 mg/dL (ref 1.7–2.4)

## 2019-12-24 DIAGNOSIS — J9621 Acute and chronic respiratory failure with hypoxia: Secondary | ICD-10-CM | POA: Diagnosis not present

## 2019-12-24 DIAGNOSIS — U071 COVID-19: Secondary | ICD-10-CM | POA: Diagnosis not present

## 2019-12-24 DIAGNOSIS — I4891 Unspecified atrial fibrillation: Secondary | ICD-10-CM | POA: Diagnosis not present

## 2019-12-24 DIAGNOSIS — N179 Acute kidney failure, unspecified: Secondary | ICD-10-CM | POA: Diagnosis not present

## 2019-12-24 LAB — T4, FREE: Free T4: 0.73 ng/dL (ref 0.61–1.12)

## 2019-12-24 LAB — CBC
HCT: 22.1 % — ABNORMAL LOW (ref 39.0–52.0)
Hemoglobin: 7 g/dL — ABNORMAL LOW (ref 13.0–17.0)
MCH: 27.3 pg (ref 26.0–34.0)
MCHC: 31.7 g/dL (ref 30.0–36.0)
MCV: 86.3 fL (ref 80.0–100.0)
Platelets: 133 10*3/uL — ABNORMAL LOW (ref 150–400)
RBC: 2.56 MIL/uL — ABNORMAL LOW (ref 4.22–5.81)
RDW: 19.9 % — ABNORMAL HIGH (ref 11.5–15.5)
WBC: 22.9 10*3/uL — ABNORMAL HIGH (ref 4.0–10.5)
nRBC: 0 % (ref 0.0–0.2)

## 2019-12-24 LAB — RENAL FUNCTION PANEL
Albumin: 1.5 g/dL — ABNORMAL LOW (ref 3.5–5.0)
Anion gap: 11 (ref 5–15)
BUN: 96 mg/dL — ABNORMAL HIGH (ref 8–23)
CO2: 26 mmol/L (ref 22–32)
Calcium: 8.6 mg/dL — ABNORMAL LOW (ref 8.9–10.3)
Chloride: 94 mmol/L — ABNORMAL LOW (ref 98–111)
Creatinine, Ser: 2.45 mg/dL — ABNORMAL HIGH (ref 0.61–1.24)
GFR calc Af Amer: 29 mL/min — ABNORMAL LOW (ref >60)
GFR calc non Af Amer: 25 mL/min — ABNORMAL LOW (ref >60)
Glucose, Bld: 123 mg/dL — ABNORMAL HIGH (ref 70–99)
Phosphorus: 3.6 mg/dL (ref 2.5–4.6)
Potassium: 4.1 mmol/L (ref 3.5–5.1)
Sodium: 131 mmol/L — ABNORMAL LOW (ref 135–145)

## 2019-12-24 LAB — BODY FLUID CULTURE: Culture: NO GROWTH

## 2019-12-24 LAB — TSH: TSH: 4.603 u[IU]/mL — ABNORMAL HIGH (ref 0.350–4.500)

## 2019-12-24 LAB — PREPARE RBC (CROSSMATCH)

## 2019-12-24 LAB — MAGNESIUM: Magnesium: 2.1 mg/dL (ref 1.7–2.4)

## 2019-12-24 NOTE — Progress Notes (Addendum)
Pulmonary Critical Care Medicine [place]   PULMONARY CRITICAL CARE SERVICE  PROGRESS NOTE  Date of Service: 12/24/2019  Shane Banks  PNT:[id number]  DOB: January 09, 1946   DOA: 11/21/2019  Referring Physician: [name], MD  HPI: Shane Banks is a 74 y.o. male seen for follow up of Acute on Chronic Respiratory Failure.  Patient remains on full support on the ventilator currently on 60% FiO2 satting well no fever distress.  Medications: Reviewed on Rounds  Physical Exam:  Vitals: Pulse 64 respirations 32 BP 156/68 O2 sat 97% temp 96.6  Ventilator Settings ventilator mode AC VC plus rate of 18 tidal volume 350 PEEP of 12 with an FiO2 of 60%  . General: Comfortable at this time . Eyes: Grossly normal lids, irises & conjunctiva . ENT: grossly tongue is normal . Neck: no obvious mass . Cardiovascular: S1 S2 normal no gallop . Respiratory: No rales or rhonchi noted . Abdomen: soft . Skin: no rash seen on limited exam . Musculoskeletal: not rigid . Psychiatric:unable to assess . Neurologic: no seizure no involuntary movements         Lab Data:   Basic Metabolic Panel: Recent Labs  Lab 12/18/19 0627 12/20/19 0553 12/21/19 1356 12/23/19 0753 12/24/19 0823  NA 125* 129* 132*  --  131*  K 5.0 4.9 4.1  --  4.1  CL 88* 92* 95*  --  94*  CO2 21* 21* 26  --  26  GLUCOSE 144* 122* 164*  --  123*  BUN 134* 133* 88*  --  96*  CREATININE 3.82* 3.51* 2.44*  --  2.45*  CALCIUM 8.2* 8.2* 8.7*  --  8.6*  MG 2.0  --  1.7 2.0 2.1  PHOS 5.3* 4.8*  --   --  3.6    ABG: No results for input(s): PHART, PCO2ART, PO2ART, HCO3, O2SAT in the last 168 hours.  Liver Function Tests: Recent Labs  Lab 12/18/19 0627 12/20/19 0553 12/24/19 0823  ALBUMIN 1.7* 1.4* 1.5*   No results for input(s): LIPASE, AMYLASE in the last 168 hours. No results for input(s): AMMONIA in the last 168 hours.  CBC: Recent Labs  Lab 12/18/19 0627 12/20/19 0831 12/21/19 0524  12/24/19 0823  WBC 30.4* 36.3* 31.0* 22.9*  HGB 7.5* 6.8* 7.9* 7.0*  HCT 22.7* 20.9* 24.6* 22.1*  MCV 84.1 84.3 85.4 86.3  PLT 143* 111* 109* 133*    Cardiac Enzymes: No results for input(s): CKTOTAL, CKMB, CKMBINDEX, TROPONINI in the last 168 hours.  BNP (last 3 results) No results for input(s): BNP in the last 8760 hours.  ProBNP (last 3 results) No results for input(s): PROBNP in the last 8760 hours.  Radiological Exams: No results found.  Assessment/Plan Active Problems:   Acute on chronic respiratory failure with hypoxia (HCC)   COVID-19 virus infection   Atrial fibrillation with RVR (HCC)   Severe sepsis (HCC)   Pneumonia due to COVID-19 virus   1. Acute on chronic respiratory failure hypoxia continue full support on ventilator at this time we will continue to attempt weaning as appropriate.  Currently requiring 60s and FiO2.  Continue supportive measures pulmonary toilet. 2. COVID-19 virus infection in resolution phase 3. Atrial fibrillation rate now rate controlled 4. Severe sepsis hemodynamics are stable 5. Pneumonia due to COVID-19 treated we will continue to monitor   I have personally seen and evaluated the patient, evaluated laboratory and imaging results, formulated the assessment and plan and placed orders. The Patient requires high complexity  decision making with multiple systems involvement.  Rounds were done with the Respiratory Therapy Director and Staff therapists and discussed with nursing staff also.  Allyne Gee, MD [facility] Pulmonary Critical Care Medicine Sleep Medicine

## 2019-12-24 NOTE — Progress Notes (Addendum)
Pulmonary Critical Care Medicine [place]   PULMONARY CRITICAL CARE SERVICE  PROGRESS NOTE  Date of Service: 12/24/2019  Shane Banks  VPX:[id number]  DOB: 1946-03-29   DOA: 11/21/2019  Referring Physician: [name], MD  HPI: Shane Banks is a 75 y.o. male seen for follow up of Acute on Chronic Respiratory Failure.  Patient continues on full support at this time satting well currently down to 55% FiO2.  Medications: Reviewed on Rounds  Physical Exam:  Vitals: Pulse 66 respirations 34 BP 151/61 O2 sat 99% temp 95.7  Ventilator Settings ventilator mode AC VC plus rate of 18 tidal volume 350 PEEP 12 and FiO2 of 55%  . General: Comfortable at this time . Eyes: Grossly normal lids, irises & conjunctiva . ENT: grossly tongue is normal . Neck: no obvious mass . Cardiovascular: S1 S2 normal no gallop . Respiratory: No rales or rhonchi noted . Abdomen: soft . Skin: no rash seen on limited exam . Musculoskeletal: not rigid . Psychiatric:unable to assess . Neurologic: no seizure no involuntary movements         Lab Data:   Basic Metabolic Panel: Recent Labs  Lab 12/18/19 0627 12/20/19 0553 12/21/19 1356 12/23/19 0753 12/24/19 0823  NA 125* 129* 132*  --  131*  K 5.0 4.9 4.1  --  4.1  CL 88* 92* 95*  --  94*  CO2 21* 21* 26  --  26  GLUCOSE 144* 122* 164*  --  123*  BUN 134* 133* 88*  --  96*  CREATININE 3.82* 3.51* 2.44*  --  2.45*  CALCIUM 8.2* 8.2* 8.7*  --  8.6*  MG 2.0  --  1.7 2.0 2.1  PHOS 5.3* 4.8*  --   --  3.6    ABG: No results for input(s): PHART, PCO2ART, PO2ART, HCO3, O2SAT in the last 168 hours.  Liver Function Tests: Recent Labs  Lab 12/18/19 0627 12/20/19 0553 12/24/19 0823  ALBUMIN 1.7* 1.4* 1.5*   No results for input(s): LIPASE, AMYLASE in the last 168 hours. No results for input(s): AMMONIA in the last 168 hours.  CBC: Recent Labs  Lab 12/18/19 0627 12/20/19 0831 12/21/19 0524 12/24/19 0823  WBC 30.4*  36.3* 31.0* 22.9*  HGB 7.5* 6.8* 7.9* 7.0*  HCT 22.7* 20.9* 24.6* 22.1*  MCV 84.1 84.3 85.4 86.3  PLT 143* 111* 109* 133*    Cardiac Enzymes: No results for input(s): CKTOTAL, CKMB, CKMBINDEX, TROPONINI in the last 168 hours.  BNP (last 3 results) No results for input(s): BNP in the last 8760 hours.  ProBNP (last 3 results) No results for input(s): PROBNP in the last 8760 hours.  Radiological Exams: No results found.  Assessment/Plan Active Problems:   Acute on chronic respiratory failure with hypoxia (HCC)   COVID-19 virus infection   Atrial fibrillation with RVR (HCC)   Severe sepsis (HCC)   Pneumonia due to COVID-19 virus   1. Acute on chronic respiratory failure hypoxia continue full support ventilator as well as pulmonary toilet supportive measures at this time. 2. COVID-19 virus infection in resolution phase 3. Atrial fibrillation rate now rate controlled 4. Severe sepsis hemodynamics are stable 5. Pneumonia due to COVID-19 treated we will continue to monitor   I have personally seen and evaluated the patient, evaluated laboratory and imaging results, formulated the assessment and plan and placed orders. The Patient requires high complexity decision making with multiple systems involvement.  Rounds were done with the Respiratory Therapy Director and Staff therapists  and discussed with nursing staff also.  Allyne Gee, MD [facility] Pulmonary Critical Care Medicine Sleep Medicine

## 2019-12-25 ENCOUNTER — Other Ambulatory Visit (HOSPITAL_COMMUNITY): Payer: Medicare HMO

## 2019-12-25 DIAGNOSIS — N179 Acute kidney failure, unspecified: Secondary | ICD-10-CM | POA: Diagnosis not present

## 2019-12-25 DIAGNOSIS — I4891 Unspecified atrial fibrillation: Secondary | ICD-10-CM | POA: Diagnosis not present

## 2019-12-25 DIAGNOSIS — J9621 Acute and chronic respiratory failure with hypoxia: Secondary | ICD-10-CM | POA: Diagnosis not present

## 2019-12-25 DIAGNOSIS — U071 COVID-19: Secondary | ICD-10-CM | POA: Diagnosis not present

## 2019-12-25 LAB — RENAL FUNCTION PANEL
Albumin: 1.5 g/dL — ABNORMAL LOW (ref 3.5–5.0)
Anion gap: 11 (ref 5–15)
BUN: 112 mg/dL — ABNORMAL HIGH (ref 8–23)
CO2: 25 mmol/L (ref 22–32)
Calcium: 8.7 mg/dL — ABNORMAL LOW (ref 8.9–10.3)
Chloride: 92 mmol/L — ABNORMAL LOW (ref 98–111)
Creatinine, Ser: 2.74 mg/dL — ABNORMAL HIGH (ref 0.61–1.24)
GFR calc Af Amer: 26 mL/min — ABNORMAL LOW (ref >60)
GFR calc non Af Amer: 22 mL/min — ABNORMAL LOW (ref >60)
Glucose, Bld: 82 mg/dL (ref 70–99)
Phosphorus: 3.8 mg/dL (ref 2.5–4.6)
Potassium: 4.2 mmol/L (ref 3.5–5.1)
Sodium: 128 mmol/L — ABNORMAL LOW (ref 135–145)

## 2019-12-25 LAB — CBC
HCT: 24.6 % — ABNORMAL LOW (ref 39.0–52.0)
Hemoglobin: 8.1 g/dL — ABNORMAL LOW (ref 13.0–17.0)
MCH: 28.1 pg (ref 26.0–34.0)
MCHC: 32.9 g/dL (ref 30.0–36.0)
MCV: 85.4 fL (ref 80.0–100.0)
Platelets: 178 10*3/uL (ref 150–400)
RBC: 2.88 MIL/uL — ABNORMAL LOW (ref 4.22–5.81)
RDW: 19.4 % — ABNORMAL HIGH (ref 11.5–15.5)
WBC: 26.9 10*3/uL — ABNORMAL HIGH (ref 4.0–10.5)
nRBC: 0 % (ref 0.0–0.2)

## 2019-12-25 LAB — BPAM RBC
Blood Product Expiration Date: 202105012359
ISSUE DATE / TIME: 202103281618
Unit Type and Rh: 5100

## 2019-12-25 LAB — TYPE AND SCREEN
ABO/RH(D): O POS
Antibody Screen: NEGATIVE
Unit division: 0

## 2019-12-25 LAB — CYTOLOGY - NON PAP

## 2019-12-25 NOTE — Progress Notes (Signed)
[facility]  ROUNDING NOTE   Subjective:  Patient completed dialysis treatment today. Tolerated well. Still on the ventilator. Appears to be having some shortness of breath.    Objective:  Vital signs in last 24 hours:  Temperature 96.7 pulse 71 respiration 35 blood pressure 167/68  Physical Exam: General: Critically ill-appearing  Head: Normocephalic, atraumatic. Moist oral mucosal membranes  Eyes: Anicteric  Neck: Tracheostomy in place  Lungs:  Scattered rhonchi, vent assisted  Heart: S1S2 no rubs  Abdomen:  Soft, nontender, bowel sounds present, PEG tube in place  Extremities: 2+ peripheral edema.  Neurologic: Not following commands  Skin: No rash  Access: Right IJ temporary dialysis catheter    Basic Metabolic Panel: Recent Labs  Lab 12/20/19 0553 12/20/19 0553 12/21/19 1356 12/23/19 0753 12/24/19 0823 12/25/19 0628  NA 129*  --  132*  --  131* 128*  K 4.9  --  4.1  --  4.1 4.2  CL 92*  --  95*  --  94* 92*  CO2 21*  --  26  --  26 25  GLUCOSE 122*  --  164*  --  123* 82  BUN 133*  --  88*  --  96* 112*  CREATININE 3.51*  --  2.44*  --  2.45* 2.74*  CALCIUM 8.2*   < > 8.7*  --  8.6* 8.7*  MG  --   --  1.7 2.0 2.1  --   PHOS 4.8*  --   --   --  3.6 3.8   < > = values in this interval not displayed.    Liver Function Tests: Recent Labs  Lab 12/20/19 0553 12/24/19 0823 12/25/19 0628  ALBUMIN 1.4* 1.5* 1.5*   No results for input(s): LIPASE, AMYLASE in the last 168 hours. No results for input(s): AMMONIA in the last 168 hours.  CBC: Recent Labs  Lab 12/20/19 0831 12/21/19 0524 12/24/19 0823 12/25/19 0628  WBC 36.3* 31.0* 22.9* 26.9*  HGB 6.8* 7.9* 7.0* 8.1*  HCT 20.9* 24.6* 22.1* 24.6*  MCV 84.3 85.4 86.3 85.4  PLT 111* 109* 133* 178    Cardiac Enzymes: No results for input(s): CKTOTAL, CKMB, CKMBINDEX, TROPONINI in the last 168 hours.  BNP: Invalid input(s): POCBNP  CBG: No results for input(s): GLUCAP in the last 168  hours.  Microbiology: Results for orders placed or performed during the hospital encounter of 11/21/19  Culture, Urine     Status: Abnormal   Collection Time: 11/22/19  1:25 PM   Specimen: Urine, Random  Result Value Ref Range Status   Specimen Description URINE, RANDOM  Final   Special Requests   Final    NONE Performed at [facility], [address], [place], [place] [address]    Culture 50,000 COLONIES/mL YEAST (A)  Final   Report Status 11/23/2019 FINAL  Final  Culture, blood (routine x 2)     Status: None   Collection Time: 11/22/19  1:40 PM   Specimen: BLOOD LEFT HAND  Result Value Ref Range Status   Specimen Description BLOOD LEFT HAND  Final   Special Requests   Final    BOTTLES DRAWN AEROBIC ONLY Blood Culture results may not be optimal due to an inadequate volume of blood received in culture bottles   Culture   Final    NO GROWTH 5 DAYS Performed at [facility], [place] [address] [place], [place] [address]    Report Status 11/27/2019 FINAL  Final  Culture, blood (routine x 2)     Status: None   Collection Time: 11/22/19  1:44 PM   Specimen: BLOOD LEFT HAND  Result Value Ref Range Status   Specimen Description BLOOD LEFT HAND  Final   Special Requests   Final    BOTTLES DRAWN AEROBIC ONLY Blood Culture results may not be optimal due to an inadequate volume of blood received in culture bottles   Culture   Final    NO GROWTH 5 DAYS Performed at [facility], [place] [address], [place], [place] [address]    Report Status 11/27/2019 FINAL  Final  Culture, respiratory (non-expectorated)     Status: None   Collection Time: 11/22/19  4:59 PM   Specimen: Tracheal Aspirate; Respiratory  Result Value Ref Range Status   Specimen Description TRACHEAL ASPIRATE  Final   Special Requests NONE  Final   Gram Stain   Final    ABUNDANT WBC PRESENT, PREDOMINANTLY PMN NO ORGANISMS SEEN    Culture   Final    NO GROWTH 2 DAYS Performed at [facility],  [address] [place], [place] [address]    Report Status 11/24/2019 FINAL  Final  Culture, respiratory (non-expectorated)     Status: None   Collection Time: 12/04/19  2:22 AM   Specimen: Tracheal Aspirate; Respiratory  Result Value Ref Range Status   Specimen Description TRACHEAL ASPIRATE  Final   Special Requests NONE  Final   Gram Stain   Final    ABUNDANT WBC PRESENT, PREDOMINANTLY PMN ABUNDANT GRAM NEGATIVE RODS Performed at [facility], [address], [place], [place] [address]    Culture   Final    MODERATE PSEUDOMONAS AERUGINOSA FEW ESCHERICHIA COLI Confirmed Extended Spectrum Beta-Lactamase Producer (ESBL).  In bloodstream infections from ESBL organisms, carbapenems are preferred over piperacillin/tazobactam. They are shown to have a lower risk of mortality.    Report Status 12/09/2019 FINAL  Final   Organism ID, Bacteria PSEUDOMONAS AERUGINOSA  Final   Organism ID, Bacteria ESCHERICHIA COLI  Final      Susceptibility   Escherichia coli - MIC*    AMPICILLIN >=32 RESISTANT Resistant     CEFAZOLIN >=64 RESISTANT Resistant     CEFEPIME 2 SENSITIVE Sensitive     CEFTAZIDIME RESISTANT Resistant     CEFTRIAXONE >=64 RESISTANT Resistant     CIPROFLOXACIN <=0.25 SENSITIVE Sensitive     GENTAMICIN >=16 RESISTANT Resistant     IMIPENEM <=0.25 SENSITIVE Sensitive     TRIMETH/SULFA >=320 RESISTANT Resistant     AMPICILLIN/SULBACTAM 16 INTERMEDIATE Intermediate     PIP/TAZO <=4 SENSITIVE Sensitive     * FEW ESCHERICHIA COLI   Pseudomonas aeruginosa - MIC*    CEFTAZIDIME 16 INTERMEDIATE Intermediate     CIPROFLOXACIN >=4 RESISTANT Resistant     GENTAMICIN 2 SENSITIVE Sensitive     IMIPENEM >=16 RESISTANT Resistant     * MODERATE PSEUDOMONAS AERUGINOSA  Culture, Urine     Status: Abnormal   Collection Time: 12/04/19  6:33 PM   Specimen: Urine, Random  Result Value Ref Range Status   Specimen Description URINE, RANDOM  Final   Special Requests   Final    NONE Performed  at [facility], [address] [place], [place] [address]    Culture >=100,000 COLONIES/mL PSEUDOMONAS AERUGINOSA (A)  Final   Report Status 12/06/2019 FINAL  Final   Organism ID, Bacteria PSEUDOMONAS AERUGINOSA (A)  Final  Susceptibility   Pseudomonas aeruginosa - MIC*    CEFTAZIDIME 4 SENSITIVE Sensitive     CIPROFLOXACIN >=4 RESISTANT Resistant     GENTAMICIN 4 SENSITIVE Sensitive     IMIPENEM 2 SENSITIVE Sensitive     PIP/TAZO 16 SENSITIVE Sensitive     * >=100,000 COLONIES/mL PSEUDOMONAS AERUGINOSA  Culture, blood (routine x 2)     Status: Abnormal   Collection Time: 12/11/19  9:39 AM   Specimen: BLOOD LEFT HAND  Result Value Ref Range Status   Specimen Description BLOOD LEFT HAND  Final   Special Requests   Final    BOTTLES DRAWN AEROBIC AND ANAEROBIC Blood Culture adequate volume   Culture  Setup Time   Final    GRAM POSITIVE COCCI IN CLUSTERS ANAEROBIC BOTTLE ONLY CRITICAL RESULT CALLED TO, READ BACK BY AND VERIFIED WITH: RN R Hiltonia [id number] AT 44 BY CM    Culture (A)  Final    STAPHYLOCOCCUS SPECIES (COAGULASE NEGATIVE) THE SIGNIFICANCE OF ISOLATING THIS ORGANISM FROM A SINGLE SET OF BLOOD CULTURES WHEN MULTIPLE SETS ARE DRAWN IS UNCERTAIN. PLEASE NOTIFY THE MICROBIOLOGY DEPARTMENT WITHIN ONE WEEK IF SPECIATION AND SENSITIVITIES ARE REQUIRED. Performed at [facility], [place] [address], [place], [place] [address]    Report Status 12/14/2019 FINAL  Final  Culture, blood (routine x 2)     Status: None   Collection Time: 12/11/19  9:47 AM   Specimen: BLOOD  Result Value Ref Range Status   Specimen Description BLOOD RIGHT ANTECUBITAL  Final   Special Requests   Final    BOTTLES DRAWN AEROBIC AND ANAEROBIC Blood Culture adequate volume   Culture  Setup Time   Final    CORRECTED RESULTS NO ORGANISMS SEEN PREVIOUSLY REPORTED AS: GRAM POSITIVE COCCI IN CLUSTERS CORRECTED RESULTS CALLED TO: RN R CUMMINGS [id number] AT 920 AM BY CM    Culture   Final    NO  GROWTH 5 DAYS Performed at [facility], [place] [address] [place], [place] [address]    Report Status 12/16/2019 FINAL  Final  Culture, respiratory (non-expectorated)     Status: None (Preliminary result)   Collection Time: 12/11/19 10:27 AM   Specimen: Tracheal Aspirate; Respiratory  Result Value Ref Range Status   Specimen Description TRACHEAL ASPIRATE  Final   Special Requests   Final    NONE Performed at [facility], [place] [address], [place], [place] [address]    Gram Stain NO WBC SEEN NO ORGANISMS SEEN   Final   Culture   Final    FEW PSEUDOMONAS AERUGINOSA FEW STENOTROPHOMONAS MALTOPHILIA PSEUDOMONAS AERUGINOSA Sent to [facility] for further susceptibility testing.    Report Status PENDING  Incomplete   Organism ID, Bacteria PSEUDOMONAS AERUGINOSA  Final   Organism ID, Bacteria STENOTROPHOMONAS MALTOPHILIA  Final      Susceptibility   Pseudomonas aeruginosa - MIC*    CEFTAZIDIME 16 INTERMEDIATE Intermediate     CIPROFLOXACIN >=4 RESISTANT Resistant     GENTAMICIN 2 SENSITIVE Sensitive     IMIPENEM >=16 RESISTANT Resistant     * FEW PSEUDOMONAS AERUGINOSA   Stenotrophomonas maltophilia - MIC*    LEVOFLOXACIN 0.5 SENSITIVE Sensitive     TRIMETH/SULFA <=20 SENSITIVE Sensitive     * FEW STENOTROPHOMONAS MALTOPHILIA  Culture, Urine     Status: Abnormal   Collection Time: 12/11/19 11:30 AM   Specimen: Urine, Random  Result Value Ref Range Status   Specimen Description URINE, RANDOM  Final  Special Requests NONE  Final   Culture (A)  Final    <10,000 COLONIES/mL INSIGNIFICANT GROWTH Performed at [facility], [place] [address], [place], [place] [address]    Report Status 12/12/2019 FINAL  Final  Culture, blood (routine x 2)     Status: None   Collection Time: 12/15/19 12:37 PM   Specimen: BLOOD LEFT HAND  Result Value Ref Range Status   Specimen Description BLOOD LEFT HAND  Final   Special Requests   Final    BOTTLES DRAWN AEROBIC AND ANAEROBIC Blood  Culture adequate volume   Culture   Final    NO GROWTH 5 DAYS Performed at [facility], [place] [address], [place], [place] [address]    Report Status 12/20/2019 FINAL  Final  Culture, blood (routine x 2)     Status: None   Collection Time: 12/15/19 12:42 PM   Specimen: BLOOD LEFT HAND  Result Value Ref Range Status   Specimen Description BLOOD LEFT HAND  Final   Special Requests   Final    BOTTLES DRAWN AEROBIC AND ANAEROBIC Blood Culture adequate volume   Culture   Final    NO GROWTH 5 DAYS Performed at [facility], [place] [address], [place], [place] [address]    Report Status 12/20/2019 FINAL  Final  C Difficile Quick Screen w PCR reflex     Status: None   Collection Time: 12/17/19  7:00 PM  Result Value Ref Range Status   C Diff antigen NEGATIVE NEGATIVE Final   C Diff toxin NEGATIVE NEGATIVE Final   C Diff interpretation No C. difficile detected.  Final    Comment: Performed at [facility], [place] [address], [place], [place] [address]  SARS CORONAVIRUS 2 (TAT 6-24 HRS) Nasopharyngeal Nasopharyngeal Swab     Status: None   Collection Time: 12/20/19 11:45 AM   Specimen: Nasopharyngeal Swab  Result Value Ref Range Status   SARS Coronavirus 2 NEGATIVE NEGATIVE Final    Comment: (NOTE) SARS-CoV-2 target nucleic acids are NOT DETECTED. The SARS-CoV-2 RNA is generally detectable in upper and lower respiratory specimens during the acute phase of infection. Negative results do not preclude SARS-CoV-2 infection, do not rule out co-infections with other pathogens, and should not be used as the sole basis for treatment or other patient management decisions. Negative results must be combined with clinical observations, patient history, and epidemiological information. The expected result is Negative. Fact Sheet for Patients: SugarRoll.be Fact Sheet for Healthcare Providers: https://www.woods-mathews.com/ This test is not yet  approved or cleared by the [place] FDA and  has been authorized for detection and/or diagnosis of SARS-CoV-2 by FDA under an Emergency Use Authorization (EUA). This EUA will remain  in effect (meaning this test can be used) for the duration of the COVID-19 declaration under Section 56 4(b)(1) of the Act, 21 U.S.C. section 360bbb-3(b)(1), unless the authorization is terminated or revoked sooner. Performed at [facility], [place] [address], [place], [place] [address]   Body fluid culture     Status: None   Collection Time: 12/21/19 12:10 PM   Specimen: Lung, Left; Pleural Fluid  Result Value Ref Range Status   Specimen Description PLEURAL FLUID  Final   Special Requests LEFT LUNG THORA  Final   Gram Stain   Final    RARE WBC PRESENT, PREDOMINANTLY MONONUCLEAR NO ORGANISMS SEEN    Culture   Final    NO GROWTH 3 DAYS Performed at  [facility], [place] [address], [place],  [address]    Report Status 12/24/2019 FINAL  Final    Coagulation Studies: No results for input(s): LABPROT, INR in the last 72 hours.  Urinalysis: No results for input(s): COLORURINE, LABSPEC, PHURINE, GLUCOSEU, HGBUR, BILIRUBINUR, KETONESUR, PROTEINUR, UROBILINOGEN, NITRITE, LEUKOCYTESUR in the last 72 hours.  Invalid input(s): APPERANCEUR    Imaging: DG CHEST PORT 1 VIEW  Result Date: 12/25/2019 CLINICAL DATA:  Tracheostomy tube. EXAM: PORTABLE CHEST 1 VIEW COMPARISON:  Three days ago FINDINGS: Cardiomegaly. Patchy bilateral pulmonary infiltrate with blunting of the lateral right costophrenic sulcus from pleural effusion. Dialysis catheter with tips at the upper right atrium. No pneumothorax. IMPRESSION: Stable bilateral pneumonia and pleural fluid. Electronically Signed   By: Monte Fantasia M.D.   On: 12/25/2019 07:01     Medications:     heparin, lidocaine, lidocaine  Assessment/ Plan:  74 y.o. male with a PMHx of acute respiratory failure status post COVID-19 infection status  post tracheostomy placement, atrial fibrillation, hypertension, CVA, gout, hypertrophic cardiomyopathy, who was admitted to [facility] on 11/21/2019 for ongoing treatment of acute respiratory failure.  1.  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  Patient continues to have significant azotemia over the weekend.  BUN was 112 with a creatinine of 2.74.  Patient did undergo dialysis treatment and tolerated well today.  He will need ongoing dialysis treatment at the moment.  2.  Acute respiratory failure secondary to COVID-19 infection and its sequela.  -Patient to be maintained on ventilatory support at this time.  3.  Hyperkalemia.  Potassium 4.2 this a.m.  Continue to monitor.  4.  Hyponatremia.  Sodium did drop to 128 over the weekend due to low free water clearance.  This should correct with ongoing dialysis.     LOS: 0 Kaycee Mcgaugh 3/29/20214:43 PM

## 2019-12-25 NOTE — Progress Notes (Signed)
Pulmonary Critical Care Medicine [place]   PULMONARY CRITICAL CARE SERVICE  PROGRESS NOTE  Date of Service: 12/25/2019  Shane Banks  ZOX:[id number]  DOB: 11/29/1945   DOA: 11/21/2019  Referring Physician: [name], MD  HPI: Shane Banks is a 74 y.o. male seen for follow up of Acute on Chronic Respiratory Failure.  Patient is doing well on full support at this time.  Was attempted at weaning did not tolerate it we will have respiratory therapy once again reassess.  Patient has been getting dialysis regularly per recommendations of nephrology  Medications: Reviewed on Rounds  Physical Exam:  Vitals: Temperature is 96.7 pulse 71 respiratory rate 35 blood pressure is 167/68 saturations 98%  Ventilator Settings on assist control FiO2 of 55% tidal volume 350 with a PEEP of 5  . General: Comfortable at this time . Eyes: Grossly normal lids, irises & conjunctiva . ENT: grossly tongue is normal . Neck: no obvious mass . Cardiovascular: S1 S2 normal no gallop . Respiratory: No rhonchi no rales are noted at this time . Abdomen: soft . Skin: no rash seen on limited exam . Musculoskeletal: not rigid . Psychiatric:unable to assess . Neurologic: no seizure no involuntary movements         Lab Data:   Basic Metabolic Panel: Recent Labs  Lab 12/20/19 0553 12/21/19 1356 12/23/19 0753 12/24/19 0823 12/25/19 0628  NA 129* 132*  --  131* 128*  K 4.9 4.1  --  4.1 4.2  CL 92* 95*  --  94* 92*  CO2 21* 26  --  26 25  GLUCOSE 122* 164*  --  123* 82  BUN 133* 88*  --  96* 112*  CREATININE 3.51* 2.44*  --  2.45* 2.74*  CALCIUM 8.2* 8.7*  --  8.6* 8.7*  MG  --  1.7 2.0 2.1  --   PHOS 4.8*  --   --  3.6 3.8    ABG: No results for input(s): PHART, PCO2ART, PO2ART, HCO3, O2SAT in the last 168 hours.  Liver Function Tests: Recent Labs  Lab 12/20/19 0553 12/24/19 0823 12/25/19 0628  ALBUMIN 1.4* 1.5* 1.5*   No results for input(s): LIPASE, AMYLASE in the  last 168 hours. No results for input(s): AMMONIA in the last 168 hours.  CBC: Recent Labs  Lab 12/20/19 0831 12/21/19 0524 12/24/19 0823 12/25/19 0628  WBC 36.3* 31.0* 22.9* 26.9*  HGB 6.8* 7.9* 7.0* 8.1*  HCT 20.9* 24.6* 22.1* 24.6*  MCV 84.3 85.4 86.3 85.4  PLT 111* 109* 133* 178    Cardiac Enzymes: No results for input(s): CKTOTAL, CKMB, CKMBINDEX, TROPONINI in the last 168 hours.  BNP (last 3 results) No results for input(s): BNP in the last 8760 hours.  ProBNP (last 3 results) No results for input(s): PROBNP in the last 8760 hours.  Radiological Exams: DG CHEST PORT 1 VIEW  Result Date: 12/25/2019 CLINICAL DATA:  Tracheostomy tube. EXAM: PORTABLE CHEST 1 VIEW COMPARISON:  Three days ago FINDINGS: Cardiomegaly. Patchy bilateral pulmonary infiltrate with blunting of the lateral right costophrenic sulcus from pleural effusion. Dialysis catheter with tips at the upper right atrium. No pneumothorax. IMPRESSION: Stable bilateral pneumonia and pleural fluid. Electronically Signed   By: Monte Fantasia M.D.   On: 12/25/2019 07:01    Assessment/Plan Active Problems:   Acute on chronic respiratory failure with hypoxia (HCC)   COVID-19 virus infection   Atrial fibrillation with RVR (HCC)   Severe sepsis (HCC)   Pneumonia due to COVID-19  virus   1. Acute on chronic respiratory failure with hypoxia plan is to continue with weaning attempts.  Titrate oxygen continue pulmonary toilet. 2. COVID-19 virus infection in recovery phase we will continue with supportive care 3. Atrial fibrillation AAS controlled at this time we will continue supportive care cardiology is following along. 4. Severe sepsis we will continue to follow recommendations of infectious disease 5. Pneumonia due to COVID-19 chest x-ray shows stable appearance slow to resolve we will continue to monitor   I have personally seen and evaluated the patient, evaluated laboratory and imaging results, formulated the  assessment and plan and placed orders. The Patient requires high complexity decision making with multiple systems involvement.  Rounds were done with the Respiratory Therapy Director and Staff therapists and discussed with nursing staff also.  Allyne Gee, MD [facility] Pulmonary Critical Care Medicine Sleep Medicine

## 2019-12-26 DIAGNOSIS — J9621 Acute and chronic respiratory failure with hypoxia: Secondary | ICD-10-CM | POA: Diagnosis not present

## 2019-12-26 DIAGNOSIS — I4891 Unspecified atrial fibrillation: Secondary | ICD-10-CM | POA: Diagnosis not present

## 2019-12-26 DIAGNOSIS — U071 COVID-19: Secondary | ICD-10-CM | POA: Diagnosis not present

## 2019-12-26 DIAGNOSIS — N179 Acute kidney failure, unspecified: Secondary | ICD-10-CM | POA: Diagnosis not present

## 2019-12-26 LAB — CULTURE, RESPIRATORY W GRAM STAIN: Gram Stain: NONE SEEN

## 2019-12-26 LAB — MISC LABCORP TEST (SEND OUT): Labcorp test code: 182261

## 2019-12-26 NOTE — Progress Notes (Signed)
Pulmonary Critical Care Medicine [place]   PULMONARY CRITICAL CARE SERVICE  PROGRESS NOTE  Date of Service: 12/26/2019  Shane Banks  HYQ:[id number]  DOB: [birth year]   DOA: 11/21/2019  Referring Physician: [name], MD  HPI: Shane Banks is a 74 y.o. male seen for follow up of Acute on Chronic Respiratory Failure.  Patient currently is on assist control mode has been on 45% FiO2 not really tolerating weaning right now  Medications: Reviewed on Rounds  Physical Exam:  Vitals: Temperature is 97.4 pulse 75 respiratory rate 30 blood pressure is 120/60 saturations 98%  Ventilator Settings on assist control FiO2 is 45% PEEP 12 tidal line 379  . General: Comfortable at this time . Eyes: Grossly normal lids, irises & conjunctiva . ENT: grossly tongue is normal . Neck: no obvious mass . Cardiovascular: S1 S2 normal no gallop . Respiratory: Coarse breath sounds with few scattered rhonchi . Abdomen: soft . Skin: no rash seen on limited exam . Musculoskeletal: not rigid . Psychiatric:unable to assess . Neurologic: no seizure no involuntary movements         Lab Data:   Basic Metabolic Panel: Recent Labs  Lab 12/20/19 0553 12/21/19 1356 12/23/19 0753 12/24/19 0823 12/25/19 0628  NA 129* 132*  --  131* 128*  K 4.9 4.1  --  4.1 4.2  CL 92* 95*  --  94* 92*  CO2 21* 26  --  26 25  GLUCOSE 122* 164*  --  123* 82  BUN 133* 88*  --  96* 112*  CREATININE 3.51* 2.44*  --  2.45* 2.74*  CALCIUM 8.2* 8.7*  --  8.6* 8.7*  MG  --  1.7 2.0 2.1  --   PHOS 4.8*  --   --  3.6 3.8    ABG: No results for input(s): PHART, PCO2ART, PO2ART, HCO3, O2SAT in the last 168 hours.  Liver Function Tests: Recent Labs  Lab 12/20/19 0553 12/24/19 0823 12/25/19 0628  ALBUMIN 1.4* 1.5* 1.5*   No results for input(s): LIPASE, AMYLASE in the last 168 hours. No results for input(s): AMMONIA in the last 168 hours.  CBC: Recent Labs  Lab 12/20/19 0831 12/21/19 0524  12/24/19 0823 12/25/19 0628  WBC 36.3* 31.0* 22.9* 26.9*  HGB 6.8* 7.9* 7.0* 8.1*  HCT 20.9* 24.6* 22.1* 24.6*  MCV 84.3 85.4 86.3 85.4  PLT 111* 109* 133* 178    Cardiac Enzymes: No results for input(s): CKTOTAL, CKMB, CKMBINDEX, TROPONINI in the last 168 hours.  BNP (last 3 results) No results for input(s): BNP in the last 8760 hours.  ProBNP (last 3 results) No results for input(s): PROBNP in the last 8760 hours.  Radiological Exams: DG CHEST PORT 1 VIEW  Result Date: 12/25/2019 CLINICAL DATA:  Tracheostomy tube. EXAM: PORTABLE CHEST 1 VIEW COMPARISON:  Three days ago FINDINGS: Cardiomegaly. Patchy bilateral pulmonary infiltrate with blunting of the lateral right costophrenic sulcus from pleural effusion. Dialysis catheter with tips at the upper right atrium. No pneumothorax. IMPRESSION: Stable bilateral pneumonia and pleural fluid. Electronically Signed   By: Monte Fantasia M.D.   On: 12/25/2019 07:01    Assessment/Plan Active Problems:   Acute on chronic respiratory failure with hypoxia (HCC)   COVID-19 virus infection   Atrial fibrillation with RVR (HCC)   Severe sepsis (HCC)   Pneumonia due to COVID-19 virus   1. Acute on chronic respiratory failure hypoxia we will continue with full support on the ventilator right now check RSB I mechanics  2. COVID-19 virus infection significant infiltrate still noted on the last chest x-ray 3. Atrial fibrillation rate controlled 4. Severe sepsis resolved 5. Pneumonia due to COVID-19 as above significant residual infiltrates noted on the chest films   I have personally seen and evaluated the patient, evaluated laboratory and imaging results, formulated the assessment and plan and placed orders. The Patient requires high complexity decision making with multiple systems involvement.  Rounds were done with the Respiratory Therapy Director and Staff therapists and discussed with nursing staff also.  Allyne Gee, MD [facility] Pulmonary  Critical Care Medicine Sleep Medicine

## 2019-12-27 DIAGNOSIS — I4891 Unspecified atrial fibrillation: Secondary | ICD-10-CM | POA: Diagnosis not present

## 2019-12-27 DIAGNOSIS — J9621 Acute and chronic respiratory failure with hypoxia: Secondary | ICD-10-CM | POA: Diagnosis not present

## 2019-12-27 DIAGNOSIS — N179 Acute kidney failure, unspecified: Secondary | ICD-10-CM | POA: Diagnosis not present

## 2019-12-27 DIAGNOSIS — U071 COVID-19: Secondary | ICD-10-CM | POA: Diagnosis not present

## 2019-12-27 LAB — CBC
HCT: 23.7 % — ABNORMAL LOW (ref 39.0–52.0)
Hemoglobin: 7.6 g/dL — ABNORMAL LOW (ref 13.0–17.0)
MCH: 27.6 pg (ref 26.0–34.0)
MCHC: 32.1 g/dL (ref 30.0–36.0)
MCV: 86.2 fL (ref 80.0–100.0)
Platelets: 249 10*3/uL (ref 150–400)
RBC: 2.75 MIL/uL — ABNORMAL LOW (ref 4.22–5.81)
RDW: 19.2 % — ABNORMAL HIGH (ref 11.5–15.5)
WBC: 22.5 10*3/uL — ABNORMAL HIGH (ref 4.0–10.5)
nRBC: 0 % (ref 0.0–0.2)

## 2019-12-27 LAB — RENAL FUNCTION PANEL
Albumin: 1.8 g/dL — ABNORMAL LOW (ref 3.5–5.0)
Anion gap: 12 (ref 5–15)
BUN: 101 mg/dL — ABNORMAL HIGH (ref 8–23)
CO2: 26 mmol/L (ref 22–32)
Calcium: 9.2 mg/dL (ref 8.9–10.3)
Chloride: 92 mmol/L — ABNORMAL LOW (ref 98–111)
Creatinine, Ser: 2.32 mg/dL — ABNORMAL HIGH (ref 0.61–1.24)
GFR calc Af Amer: 31 mL/min — ABNORMAL LOW (ref >60)
GFR calc non Af Amer: 27 mL/min — ABNORMAL LOW (ref >60)
Glucose, Bld: 126 mg/dL — ABNORMAL HIGH (ref 70–99)
Phosphorus: 3.5 mg/dL (ref 2.5–4.6)
Potassium: 3.7 mmol/L (ref 3.5–5.1)
Sodium: 130 mmol/L — ABNORMAL LOW (ref 135–145)

## 2019-12-27 LAB — MAGNESIUM: Magnesium: 2 mg/dL (ref 1.7–2.4)

## 2019-12-27 NOTE — Progress Notes (Signed)
Pulmonary Critical Care Medicine [place]   PULMONARY CRITICAL CARE SERVICE  PROGRESS NOTE  Date of Service: 12/27/2019  Shane Banks  RWE:[id number]  DOB: April 26, 1946   DOA: 11/21/2019  Referring Physician: [name], MD  HPI: Shane Banks is a 74 y.o. male seen for follow up of Acute on Chronic Respiratory Failure.  Patient is currently on full support on assist control mode has been on 40% FiO2 with a PEEP of 5 increased to 8  Medications: Reviewed on Rounds  Physical Exam:  Vitals: Temperature 96.7 pulse 74 respiratory rate 34 blood pressure is 100/69 saturations 97%  Ventilator Settings mode of ventilation is assist control FiO2 40% tidal volume 478 PEEP of 8  . General: Comfortable at this time . Eyes: Grossly normal lids, irises & conjunctiva . ENT: grossly tongue is normal . Neck: no obvious mass . Cardiovascular: S1 S2 normal no gallop . Respiratory: Coarse breath sounds with a few scattered rhonchi . Abdomen: soft . Skin: no rash seen on limited exam . Musculoskeletal: not rigid . Psychiatric:unable to assess . Neurologic: no seizure no involuntary movements         Lab Data:   Basic Metabolic Panel: Recent Labs  Lab 12/21/19 1356 12/23/19 0753 12/24/19 0823 12/25/19 0628 12/27/19 0506  NA 132*  --  131* 128* 130*  K 4.1  --  4.1 4.2 3.7  CL 95*  --  94* 92* 92*  CO2 26  --  26 25 26   GLUCOSE 164*  --  123* 82 126*  BUN 88*  --  96* 112* 101*  CREATININE 2.44*  --  2.45* 2.74* 2.32*  CALCIUM 8.7*  --  8.6* 8.7* 9.2  MG 1.7 2.0 2.1  --  2.0  PHOS  --   --  3.6 3.8 3.5    ABG: No results for input(s): PHART, PCO2ART, PO2ART, HCO3, O2SAT in the last 168 hours.  Liver Function Tests: Recent Labs  Lab 12/24/19 0823 12/25/19 0628 12/27/19 0506  ALBUMIN 1.5* 1.5* 1.8*   No results for input(s): LIPASE, AMYLASE in the last 168 hours. No results for input(s): AMMONIA in the last 168 hours.  CBC: Recent Labs  Lab  12/21/19 0524 12/24/19 0823 12/25/19 0628 12/27/19 0506  WBC 31.0* 22.9* 26.9* 22.5*  HGB 7.9* 7.0* 8.1* 7.6*  HCT 24.6* 22.1* 24.6* 23.7*  MCV 85.4 86.3 85.4 86.2  PLT 109* 133* 178 249    Cardiac Enzymes: No results for input(s): CKTOTAL, CKMB, CKMBINDEX, TROPONINI in the last 168 hours.  BNP (last 3 results) No results for input(s): BNP in the last 8760 hours.  ProBNP (last 3 results) No results for input(s): PROBNP in the last 8760 hours.  Radiological Exams: No results found.  Assessment/Plan Active Problems:   Acute on chronic respiratory failure with hypoxia (HCC)   COVID-19 virus infection   Atrial fibrillation with RVR (HCC)   Severe sepsis (HCC)   Pneumonia due to COVID-19 virus   1. Acute on chronic respiratory failure with hypoxia patient is going to continue with assist control mode right now is on 40% FiO2 good volumes are noted at this time with a PEEP of 8 we will continue aggressive pulmonary toilet 2. COVID-19 virus infection in resolution phase we will continue to monitor 3. Atrial fibrillation rate has been well controlled 4. Severe sepsis hemodynamics are stable we will monitor 5. Pneumonia due to COVID-19 treated we will continue with present management clinically is showing signs of some  improvement however radiologically we will have residual deficits   I have personally seen and evaluated the patient, evaluated laboratory and imaging results, formulated the assessment and plan and placed orders. The Patient requires high complexity decision making with multiple systems involvement.  Rounds were done with the Respiratory Therapy Director and Staff therapists and discussed with nursing staff also.  Allyne Gee, MD [facility] Pulmonary Critical Care Medicine Sleep Medicine

## 2019-12-27 NOTE — Progress Notes (Signed)
Let me know [facility]  ROUNDING NOTE   Subjective:  Patient remains on the ventilator. Noted to be tachypneic at the moment. Due for dialysis treatment today.    Objective:  Vital signs in last 24 hours:  Temperature 96.7 pulse 74 respirations 34 blood pressure 100/69  Physical Exam: General: Critically ill-appearing  Head: Normocephalic, atraumatic. Moist oral mucosal membranes  Eyes: Anicteric  Neck: Tracheostomy in place  Lungs:  Scattered rhonchi, vent assisted  Heart: S1S2 no rubs  Abdomen:  Soft, nontender, bowel sounds present, PEG tube in place  Extremities: 2+ peripheral edema.  Neurologic: Not following commands  Skin: No rash  Access: Right IJ temporary dialysis catheter    Basic Metabolic Panel: Recent Labs  Lab 12/21/19 1356 12/21/19 1356 12/23/19 0753 12/24/19 0823 12/25/19 0628 12/27/19 0506  NA 132*  --   --  131* 128* 130*  K 4.1  --   --  4.1 4.2 3.7  CL 95*  --   --  94* 92* 92*  CO2 26  --   --  26 25 26   GLUCOSE 164*  --   --  123* 82 126*  BUN 88*  --   --  96* 112* 101*  CREATININE 2.44*  --   --  2.45* 2.74* 2.32*  CALCIUM 8.7*   < >  --  8.6* 8.7* 9.2  MG 1.7  --  2.0 2.1  --  2.0  PHOS  --   --   --  3.6 3.8 3.5   < > = values in this interval not displayed.    Liver Function Tests: Recent Labs  Lab 12/24/19 0823 12/25/19 0628 12/27/19 0506  ALBUMIN 1.5* 1.5* 1.8*   No results for input(s): LIPASE, AMYLASE in the last 168 hours. No results for input(s): AMMONIA in the last 168 hours.  CBC: Recent Labs  Lab 12/20/19 0831 12/21/19 0524 12/24/19 0823 12/25/19 0628 12/27/19 0506  WBC 36.3* 31.0* 22.9* 26.9* 22.5*  HGB 6.8* 7.9* 7.0* 8.1* 7.6*  HCT 20.9* 24.6* 22.1* 24.6* 23.7*  MCV 84.3 85.4 86.3 85.4 86.2  PLT 111* 109* 133* 178 249    Cardiac Enzymes: No results for input(s): CKTOTAL, CKMB, CKMBINDEX, TROPONINI in the last 168 hours.  BNP: Invalid input(s): POCBNP  CBG: No results for input(s):  GLUCAP in the last 168 hours.  Microbiology: Results for orders placed or performed during the hospital encounter of 11/21/19  Culture, Urine     Status: Abnormal   Collection Time: 11/22/19  1:25 PM   Specimen: Urine, Random  Result Value Ref Range Status   Specimen Description URINE, RANDOM  Final   Special Requests   Final    NONE Performed at [facility], [address] [place], [place] [address]    Culture 50,000 COLONIES/mL YEAST (A)  Final   Report Status 11/23/2019 FINAL  Final  Culture, blood (routine x 2)     Status: None   Collection Time: 11/22/19  1:40 PM   Specimen: BLOOD LEFT HAND  Result Value Ref Range Status   Specimen Description BLOOD LEFT HAND  Final   Special Requests   Final    BOTTLES DRAWN AEROBIC ONLY Blood Culture results may not be optimal due to an inadequate volume of blood received in culture bottles   Culture   Final    NO GROWTH 5 DAYS Performed at [facility], [place] [address], [place], [place] [address]    Report  Status 11/27/2019 FINAL  Final  Culture, blood (routine x 2)     Status: None   Collection Time: 11/22/19  1:44 PM   Specimen: BLOOD LEFT HAND  Result Value Ref Range Status   Specimen Description BLOOD LEFT HAND  Final   Special Requests   Final    BOTTLES DRAWN AEROBIC ONLY Blood Culture results may not be optimal due to an inadequate volume of blood received in culture bottles   Culture   Final    NO GROWTH 5 DAYS Performed at [facility], [place] [address] [place], [place] [address]    Report Status 11/27/2019 FINAL  Final  Culture, respiratory (non-expectorated)     Status: None   Collection Time: 11/22/19  4:59 PM   Specimen: Tracheal Aspirate; Respiratory  Result Value Ref Range Status   Specimen Description TRACHEAL ASPIRATE  Final   Special Requests NONE  Final   Gram Stain   Final    ABUNDANT WBC PRESENT, PREDOMINANTLY PMN NO ORGANISMS SEEN    Culture   Final    NO GROWTH 2 DAYS Performed at  [facility], [address], [place], [place] [address]    Report Status 11/24/2019 FINAL  Final  Culture, respiratory (non-expectorated)     Status: None   Collection Time: 12/04/19  2:22 AM   Specimen: Tracheal Aspirate; Respiratory  Result Value Ref Range Status   Specimen Description TRACHEAL ASPIRATE  Final   Special Requests NONE  Final   Gram Stain   Final    ABUNDANT WBC PRESENT, PREDOMINANTLY PMN ABUNDANT GRAM NEGATIVE RODS Performed at [facility], [address], [place], [place] [address]    Culture   Final    MODERATE PSEUDOMONAS AERUGINOSA FEW ESCHERICHIA COLI Confirmed Extended Spectrum Beta-Lactamase Producer (ESBL).  In bloodstream infections from ESBL organisms, carbapenems are preferred over piperacillin/tazobactam. They are shown to have a lower risk of mortality.    Report Status 12/09/2019 FINAL  Final   Organism ID, Bacteria PSEUDOMONAS AERUGINOSA  Final   Organism ID, Bacteria ESCHERICHIA COLI  Final      Susceptibility   Escherichia coli - MIC*    AMPICILLIN >=32 RESISTANT Resistant     CEFAZOLIN >=64 RESISTANT Resistant     CEFEPIME 2 SENSITIVE Sensitive     CEFTAZIDIME RESISTANT Resistant     CEFTRIAXONE >=64 RESISTANT Resistant     CIPROFLOXACIN <=0.25 SENSITIVE Sensitive     GENTAMICIN >=16 RESISTANT Resistant     IMIPENEM <=0.25 SENSITIVE Sensitive     TRIMETH/SULFA >=320 RESISTANT Resistant     AMPICILLIN/SULBACTAM 16 INTERMEDIATE Intermediate     PIP/TAZO <=4 SENSITIVE Sensitive     * FEW ESCHERICHIA COLI   Pseudomonas aeruginosa - MIC*    CEFTAZIDIME 16 INTERMEDIATE Intermediate     CIPROFLOXACIN >=4 RESISTANT Resistant     GENTAMICIN 2 SENSITIVE Sensitive     IMIPENEM >=16 RESISTANT Resistant     * MODERATE PSEUDOMONAS AERUGINOSA  Culture, Urine     Status: Abnormal   Collection Time: 12/04/19  6:33 PM   Specimen: Urine, Random  Result Value Ref Range Status   Specimen Description URINE, RANDOM  Final   Special Requests    Final    NONE Performed at [facility], [address] [place], [place] [address]    Culture >=100,000 COLONIES/mL PSEUDOMONAS AERUGINOSA (A)  Final   Report Status 12/06/2019 FINAL  Final   Organism ID, Bacteria PSEUDOMONAS AERUGINOSA (A)  Final      Susceptibility   Pseudomonas aeruginosa - MIC*    CEFTAZIDIME 4 SENSITIVE Sensitive     CIPROFLOXACIN >=4 RESISTANT Resistant     GENTAMICIN 4 SENSITIVE Sensitive     IMIPENEM 2 SENSITIVE Sensitive     PIP/TAZO 16 SENSITIVE Sensitive     * >=100,000 COLONIES/mL PSEUDOMONAS AERUGINOSA  Culture, blood (routine x 2)     Status: Abnormal   Collection Time: 12/11/19  9:39 AM   Specimen: BLOOD LEFT HAND  Result Value Ref Range Status   Specimen Description BLOOD LEFT HAND  Final   Special Requests   Final    BOTTLES DRAWN AEROBIC AND ANAEROBIC Blood Culture adequate volume   Culture  Setup Time   Final    GRAM POSITIVE COCCI IN CLUSTERS ANAEROBIC BOTTLE ONLY CRITICAL RESULT CALLED TO, READ BACK BY AND VERIFIED WITH: RN R Bartholomew [id number] AT 88 BY CM    Culture (A)  Final    STAPHYLOCOCCUS SPECIES (COAGULASE NEGATIVE) THE SIGNIFICANCE OF ISOLATING THIS ORGANISM FROM A SINGLE SET OF BLOOD CULTURES WHEN MULTIPLE SETS ARE DRAWN IS UNCERTAIN. PLEASE NOTIFY THE MICROBIOLOGY DEPARTMENT WITHIN ONE WEEK IF SPECIATION AND SENSITIVITIES ARE REQUIRED. Performed at [facility], [place] [address], [place], [place] [address]    Report Status 12/14/2019 FINAL  Final  Culture, blood (routine x 2)     Status: None   Collection Time: 12/11/19  9:47 AM   Specimen: BLOOD  Result Value Ref Range Status   Specimen Description BLOOD RIGHT ANTECUBITAL  Final   Special Requests   Final    BOTTLES DRAWN AEROBIC AND ANAEROBIC Blood Culture adequate volume   Culture  Setup Time   Final    CORRECTED RESULTS NO ORGANISMS SEEN PREVIOUSLY REPORTED AS: GRAM POSITIVE COCCI IN CLUSTERS CORRECTED RESULTS CALLED TO: RN R CUMMINGS [id number] AT 920 AM BY CM     Culture   Final    NO GROWTH 5 DAYS Performed at [facility], [place] [address] [place], [place] [address]    Report Status 12/16/2019 FINAL  Final  Culture, respiratory (non-expectorated)     Status: None   Collection Time: 12/11/19 10:27 AM   Specimen: Tracheal Aspirate; Respiratory  Result Value Ref Range Status   Specimen Description TRACHEAL ASPIRATE  Final   Special Requests NONE  Final   Gram Stain NO WBC SEEN NO ORGANISMS SEEN   Final   Culture   Final    FEW PSEUDOMONAS AERUGINOSA FEW STENOTROPHOMONAS MALTOPHILIA SEE SEPARATE REPORT IN [facility] FOR DOS 12/11/19. Performed at [facility], [place] [address], [place], [place] [address]    Report Status 12/26/2019 FINAL  Final   Organism ID, Bacteria PSEUDOMONAS AERUGINOSA  Final   Organism ID, Bacteria STENOTROPHOMONAS MALTOPHILIA  Final      Susceptibility   Pseudomonas aeruginosa - MIC*    CEFTAZIDIME 16 INTERMEDIATE Intermediate     CIPROFLOXACIN >=4 RESISTANT Resistant     GENTAMICIN 2 SENSITIVE Sensitive     IMIPENEM >=16 RESISTANT Resistant     * FEW PSEUDOMONAS AERUGINOSA   Stenotrophomonas maltophilia - MIC*    LEVOFLOXACIN 0.5 SENSITIVE Sensitive     TRIMETH/SULFA <=20 SENSITIVE Sensitive     * FEW STENOTROPHOMONAS MALTOPHILIA  Culture, Urine     Status: Abnormal   Collection Time: 12/11/19 11:30 AM   Specimen: Urine, Random  Result Value Ref Range Status   Specimen Description URINE, RANDOM  Final   Special  Requests NONE  Final   Culture (A)  Final    <10,000 COLONIES/mL INSIGNIFICANT GROWTH Performed at [facility], [place] [address], [place], [place] [address]    Report Status 12/12/2019 FINAL  Final  Culture, blood (routine x 2)     Status: None   Collection Time: 12/15/19 12:37 PM   Specimen: BLOOD LEFT HAND  Result Value Ref Range Status   Specimen Description BLOOD LEFT HAND  Final   Special Requests   Final    BOTTLES DRAWN AEROBIC AND ANAEROBIC Blood Culture adequate volume    Culture   Final    NO GROWTH 5 DAYS Performed at [facility], [place] [address] [place], [place] [address]    Report Status 12/20/2019 FINAL  Final  Culture, blood (routine x 2)     Status: None   Collection Time: 12/15/19 12:42 PM   Specimen: BLOOD LEFT HAND  Result Value Ref Range Status   Specimen Description BLOOD LEFT HAND  Final   Special Requests   Final    BOTTLES DRAWN AEROBIC AND ANAEROBIC Blood Culture adequate volume   Culture   Final    NO GROWTH 5 DAYS Performed at [facility], [place] [address] [place], [place] [address]    Report Status 12/20/2019 FINAL  Final  C Difficile Quick Screen w PCR reflex     Status: None   Collection Time: 12/17/19  7:00 PM  Result Value Ref Range Status   C Diff antigen NEGATIVE NEGATIVE Final   C Diff toxin NEGATIVE NEGATIVE Final   C Diff interpretation No C. difficile detected.  Final    Comment: Performed at [facility], [place] [address], [place], [place] [address]  SARS CORONAVIRUS 2 (TAT 6-24 HRS) Nasopharyngeal Nasopharyngeal Swab     Status: None   Collection Time: 12/20/19 11:45 AM   Specimen: Nasopharyngeal Swab  Result Value Ref Range Status   SARS Coronavirus 2 NEGATIVE NEGATIVE Final    Comment: (NOTE) SARS-CoV-2 target nucleic acids are NOT DETECTED. The SARS-CoV-2 RNA is generally detectable in upper and lower respiratory specimens during the acute phase of infection. Negative results do not preclude SARS-CoV-2 infection, do not rule out co-infections with other pathogens, and should not be used as the sole basis for treatment or other patient management decisions. Negative results must be combined with clinical observations, patient history, and epidemiological information. The expected result is Negative. Fact Sheet for Patients: SugarRoll.be Fact Sheet for Healthcare Providers: https://www.woods-mathews.com/ This test is not yet approved or cleared by the  [place] FDA and  has been authorized for detection and/or diagnosis of SARS-CoV-2 by FDA under an Emergency Use Authorization (EUA). This EUA will remain  in effect (meaning this test can be used) for the duration of the COVID-19 declaration under Section 56 4(b)(1) of the Act, 21 U.S.C. section 360bbb-3(b)(1), unless the authorization is terminated or revoked sooner. Performed at [facility], [place] [address], [place], [place] [address]   Body fluid culture     Status: None   Collection Time: 12/21/19 12:10 PM   Specimen: Lung, Left; Pleural Fluid  Result Value Ref Range Status   Specimen Description PLEURAL FLUID  Final   Special Requests LEFT LUNG THORA  Final   Gram Stain   Final    RARE WBC PRESENT, PREDOMINANTLY MONONUCLEAR NO ORGANISMS SEEN    Culture   Final    NO GROWTH 3 DAYS Performed at [facility], [place] [address] [place], [place] [address]    Report Status 12/24/2019 FINAL  Final  Culture, blood (routine x 2)     Status: None (Preliminary result)   Collection Time: 12/25/19  2:16 PM   Specimen: BLOOD RIGHT HAND  Result Value Ref Range Status   Specimen Description BLOOD RIGHT HAND  Final   Special Requests   Final    BOTTLES DRAWN AEROBIC ONLY Blood Culture adequate volume   Culture   Final    NO GROWTH < 24 HOURS Performed at [facility], [place] [address], [place], [place] [address]    Report Status PENDING  Incomplete  Culture, blood (routine x 2)     Status: None (Preliminary result)   Collection Time: 12/25/19  2:18 PM   Specimen: BLOOD LEFT HAND  Result Value Ref Range Status   Specimen Description BLOOD LEFT HAND  Final   Special Requests   Final    BOTTLES DRAWN AEROBIC ONLY Blood Culture adequate volume   Culture   Final    NO GROWTH < 24 HOURS Performed at [facility], [place] [address] [place], [place] [address]    Report Status PENDING  Incomplete  Culture, respiratory (non-expectorated)     Status: None  (Preliminary result)   Collection Time: 12/25/19  4:20 PM   Specimen: Tracheal Aspirate; Respiratory  Result Value Ref Range Status   Specimen Description TRACHEAL ASPIRATE  Final   Special Requests NONE  Final   Gram Stain   Final    ABUNDANT WBC PRESENT, PREDOMINANTLY PMN NO ORGANISMS SEEN    Culture   Final    CULTURE REINCUBATED FOR BETTER GROWTH Performed at [facility], [address] [place], [place] [address]    Report Status PENDING  Incomplete    Coagulation Studies: No results for input(s): LABPROT, INR in the last 72 hours.  Urinalysis: No results for input(s): COLORURINE, LABSPEC, PHURINE, GLUCOSEU, HGBUR, BILIRUBINUR, KETONESUR, PROTEINUR, UROBILINOGEN, NITRITE, LEUKOCYTESUR in the last 72 hours.  Invalid input(s): APPERANCEUR    Imaging: No results found.   Medications:     heparin, lidocaine, lidocaine  Assessment/ Plan:  74 y.o. male with a PMHx of acute respiratory failure status post COVID-19 infection status post tracheostomy placement, atrial fibrillation, hypertension, CVA, gout, hypertrophic cardiomyopathy, who was admitted to [facility] on 11/21/2019 for ongoing treatment of acute respiratory failure.  1.  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  Patient continues to have acute kidney injury with a BUN of 101 and creatinine of 2.3.  No significant urine output noted.  Therefore we will need to maintain the patient on renal replacement therapy.  Patient due for dialysis treatment today.  2.  Acute respiratory failure secondary to COVID-19 infection and its sequela.  -Patient noted to be tachypneic today.  Continue to follow pulmonary/critical care recommendations.  3.  Hyperkalemia.  Potassium now normalized with ongoing dialysis treatments.  4.  Hyponatremia.  Serum sodium improved up to 130 today.     LOS: 0 Shane Banks 3/31/20217:53 AM

## 2019-12-28 DIAGNOSIS — J9621 Acute and chronic respiratory failure with hypoxia: Secondary | ICD-10-CM | POA: Diagnosis not present

## 2019-12-28 DIAGNOSIS — I4891 Unspecified atrial fibrillation: Secondary | ICD-10-CM | POA: Diagnosis not present

## 2019-12-28 DIAGNOSIS — U071 COVID-19: Secondary | ICD-10-CM | POA: Diagnosis not present

## 2019-12-28 DIAGNOSIS — N179 Acute kidney failure, unspecified: Secondary | ICD-10-CM | POA: Diagnosis not present

## 2019-12-28 LAB — CULTURE, RESPIRATORY W GRAM STAIN: Culture: NORMAL

## 2019-12-28 NOTE — Progress Notes (Signed)
Pulmonary Critical Care Medicine [place]   PULMONARY CRITICAL CARE SERVICE  PROGRESS NOTE  Date of Service: 12/28/2019  Fidel Caggiano  VOJ:[id number]  DOB: 16-Jan-1946   DOA: 11/21/2019  Referring Physician: [name], MD  HPI: Shane Banks is a 74 y.o. male seen for follow up of Acute on Chronic Respiratory Failure.  Patient remains on the ventilator was on assist control mode currently on 40% FiO2 with a PEEP of 9 try to wean the FiO2 and PEEP down  Medications: Reviewed on Rounds  Physical Exam:  Vitals: Temperature is 98.6 pulse 74 respiratory 36 blood pressure is 152/75 saturations 96%  Ventilator Settings mode ventilation assist control FiO2 40% tidal volume 364 PEEP 9  . General: Comfortable at this time . Eyes: Grossly normal lids, irises & conjunctiva . ENT: grossly tongue is normal . Neck: no obvious mass . Cardiovascular: S1 S2 normal no gallop . Respiratory: No rhonchi no rales are noted . Abdomen: soft . Skin: no rash seen on limited exam . Musculoskeletal: not rigid . Psychiatric:unable to assess . Neurologic: no seizure no involuntary movements         Lab Data:   Basic Metabolic Panel: Recent Labs  Lab 12/23/19 0753 12/24/19 0823 12/25/19 0628 12/27/19 0506  NA  --  131* 128* 130*  K  --  4.1 4.2 3.7  CL  --  94* 92* 92*  CO2  --  26 25 26   GLUCOSE  --  123* 82 126*  BUN  --  96* 112* 101*  CREATININE  --  2.45* 2.74* 2.32*  CALCIUM  --  8.6* 8.7* 9.2  MG 2.0 2.1  --  2.0  PHOS  --  3.6 3.8 3.5    ABG: No results for input(s): PHART, PCO2ART, PO2ART, HCO3, O2SAT in the last 168 hours.  Liver Function Tests: Recent Labs  Lab 12/24/19 0823 12/25/19 0628 12/27/19 0506  ALBUMIN 1.5* 1.5* 1.8*   No results for input(s): LIPASE, AMYLASE in the last 168 hours. No results for input(s): AMMONIA in the last 168 hours.  CBC: Recent Labs  Lab 12/24/19 0823 12/25/19 0628 12/27/19 0506  WBC 22.9* 26.9* 22.5*  HGB  7.0* 8.1* 7.6*  HCT 22.1* 24.6* 23.7*  MCV 86.3 85.4 86.2  PLT 133* 178 249    Cardiac Enzymes: No results for input(s): CKTOTAL, CKMB, CKMBINDEX, TROPONINI in the last 168 hours.  BNP (last 3 results) No results for input(s): BNP in the last 8760 hours.  ProBNP (last 3 results) No results for input(s): PROBNP in the last 8760 hours.  Radiological Exams: No results found.  Assessment/Plan Active Problems:   Acute on chronic respiratory failure with hypoxia (HCC)   COVID-19 virus infection   Atrial fibrillation with RVR (HCC)   Severe sepsis (HCC)   Pneumonia due to COVID-19 virus   1. Acute on chronic respiratory failure with hypoxia plan is to continue with full support on the ventilator but wean the FiO2 down 2. COVID-19 virus infection in resolution phase 3. Atrial fibrillation rate controlled 4. Severe sepsis resolved 5. Pneumonia due to COVID-19 treated clinically is improved   I have personally seen and evaluated the patient, evaluated laboratory and imaging results, formulated the assessment and plan and placed orders. The Patient requires high complexity decision making with multiple systems involvement.  Rounds were done with the Respiratory Therapy Director and Staff therapists and discussed with nursing staff also.  Allyne Gee, MD [facility] Pulmonary Critical Care Medicine  Sleep Medicine

## 2019-12-29 ENCOUNTER — Other Ambulatory Visit (HOSPITAL_COMMUNITY): Payer: Medicare HMO

## 2019-12-29 DIAGNOSIS — U071 COVID-19: Secondary | ICD-10-CM | POA: Diagnosis not present

## 2019-12-29 DIAGNOSIS — N179 Acute kidney failure, unspecified: Secondary | ICD-10-CM | POA: Diagnosis not present

## 2019-12-29 DIAGNOSIS — J9621 Acute and chronic respiratory failure with hypoxia: Secondary | ICD-10-CM | POA: Diagnosis not present

## 2019-12-29 DIAGNOSIS — I4891 Unspecified atrial fibrillation: Secondary | ICD-10-CM | POA: Diagnosis not present

## 2019-12-29 LAB — RENAL FUNCTION PANEL
Albumin: 1.8 g/dL — ABNORMAL LOW (ref 3.5–5.0)
Anion gap: 12 (ref 5–15)
BUN: 76 mg/dL — ABNORMAL HIGH (ref 8–23)
CO2: 25 mmol/L (ref 22–32)
Calcium: 8.5 mg/dL — ABNORMAL LOW (ref 8.9–10.3)
Chloride: 95 mmol/L — ABNORMAL LOW (ref 98–111)
Creatinine, Ser: 2.25 mg/dL — ABNORMAL HIGH (ref 0.61–1.24)
GFR calc Af Amer: 33 mL/min — ABNORMAL LOW (ref >60)
GFR calc non Af Amer: 28 mL/min — ABNORMAL LOW (ref >60)
Glucose, Bld: 151 mg/dL — ABNORMAL HIGH (ref 70–99)
Phosphorus: 3.9 mg/dL (ref 2.5–4.6)
Potassium: 4.1 mmol/L (ref 3.5–5.1)
Sodium: 132 mmol/L — ABNORMAL LOW (ref 135–145)

## 2019-12-29 LAB — CBC
HCT: 23.1 % — ABNORMAL LOW (ref 39.0–52.0)
Hemoglobin: 7.3 g/dL — ABNORMAL LOW (ref 13.0–17.0)
MCH: 27.7 pg (ref 26.0–34.0)
MCHC: 31.6 g/dL (ref 30.0–36.0)
MCV: 87.5 fL (ref 80.0–100.0)
Platelets: 274 10*3/uL (ref 150–400)
RBC: 2.64 MIL/uL — ABNORMAL LOW (ref 4.22–5.81)
RDW: 19.2 % — ABNORMAL HIGH (ref 11.5–15.5)
WBC: 18.8 10*3/uL — ABNORMAL HIGH (ref 4.0–10.5)
nRBC: 0 % (ref 0.0–0.2)

## 2019-12-29 LAB — MAGNESIUM: Magnesium: 2 mg/dL (ref 1.7–2.4)

## 2019-12-29 NOTE — Progress Notes (Signed)
Let me know [facility]  ROUNDING NOTE   Subjective:  Patient seen and evaluated during hemodialysis. Still appears volume overloaded. Remains dialysis dependent.   Objective:  Vital signs in last 24 hours:  Temperature 98.1 pulse 76 respirations 30 blood pressure 132/67  Physical Exam: General: Critically ill-appearing  Head: Normocephalic, atraumatic. Moist oral mucosal membranes  Eyes: Anicteric  Neck: Tracheostomy in place  Lungs:  Scattered rhonchi, vent assisted  Heart: S1S2 no rubs  Abdomen:  Soft, nontender, bowel sounds present, PEG tube in place  Extremities: 2+ peripheral edema.  Neurologic: Not following commands  Skin: No rash  Access: Right IJ temporary dialysis catheter    Basic Metabolic Panel: Recent Labs  Lab 12/23/19 0753 12/24/19 0823 12/24/19 0823 12/25/19 0628 12/27/19 0506 12/29/19 0627  NA  --  131*  --  128* 130* 132*  K  --  4.1  --  4.2 3.7 4.1  CL  --  94*  --  92* 92* 95*  CO2  --  26  --  25 26 25   GLUCOSE  --  123*  --  82 126* 151*  BUN  --  96*  --  112* 101* 76*  CREATININE  --  2.45*  --  2.74* 2.32* 2.25*  CALCIUM  --  8.6*   < > 8.7* 9.2 8.5*  MG 2.0 2.1  --   --  2.0 2.0  PHOS  --  3.6  --  3.8 3.5 3.9   < > = values in this interval not displayed.    Liver Function Tests: Recent Labs  Lab 12/24/19 0823 12/25/19 0628 12/27/19 0506 12/29/19 0627  ALBUMIN 1.5* 1.5* 1.8* 1.8*   No results for input(s): LIPASE, AMYLASE in the last 168 hours. No results for input(s): AMMONIA in the last 168 hours.  CBC: Recent Labs  Lab 12/24/19 0823 12/25/19 0628 12/27/19 0506 12/29/19 0627  WBC 22.9* 26.9* 22.5* 18.8*  HGB 7.0* 8.1* 7.6* 7.3*  HCT 22.1* 24.6* 23.7* 23.1*  MCV 86.3 85.4 86.2 87.5  PLT 133* 178 249 274    Cardiac Enzymes: No results for input(s): CKTOTAL, CKMB, CKMBINDEX, TROPONINI in the last 168 hours.  BNP: Invalid input(s): POCBNP  CBG: No results for input(s): GLUCAP in the last 168  hours.  Microbiology: Results for orders placed or performed during the hospital encounter of 11/21/19  Culture, Urine     Status: Abnormal   Collection Time: 11/22/19  1:25 PM   Specimen: Urine, Random  Result Value Ref Range Status   Specimen Description URINE, RANDOM  Final   Special Requests   Final    NONE Performed at [facility], [address] [place], [place] [address]    Culture 50,000 COLONIES/mL YEAST (A)  Final   Report Status 11/23/2019 FINAL  Final  Culture, blood (routine x 2)     Status: None   Collection Time: 11/22/19  1:40 PM   Specimen: BLOOD LEFT HAND  Result Value Ref Range Status   Specimen Description BLOOD LEFT HAND  Final   Special Requests   Final    BOTTLES DRAWN AEROBIC ONLY Blood Culture results may not be optimal due to an inadequate volume of blood received in culture bottles   Culture   Final    NO GROWTH 5 DAYS Performed at [facility], [place] [address] [place], [place] [address]    Report Status 11/27/2019 FINAL  Final  Culture, blood (routine x 2)  Status: None   Collection Time: 11/22/19  1:44 PM   Specimen: BLOOD LEFT HAND  Result Value Ref Range Status   Specimen Description BLOOD LEFT HAND  Final   Special Requests   Final    BOTTLES DRAWN AEROBIC ONLY Blood Culture results may not be optimal due to an inadequate volume of blood received in culture bottles   Culture   Final    NO GROWTH 5 DAYS Performed at [facility], [place] [address] [place], [place] [address]    Report Status 11/27/2019 FINAL  Final  Culture, respiratory (non-expectorated)     Status: None   Collection Time: 11/22/19  4:59 PM   Specimen: Tracheal Aspirate; Respiratory  Result Value Ref Range Status   Specimen Description TRACHEAL ASPIRATE  Final   Special Requests NONE  Final   Gram Stain   Final    ABUNDANT WBC PRESENT, PREDOMINANTLY PMN NO ORGANISMS SEEN    Culture   Final    NO GROWTH 2 DAYS Performed at [facility],  [address], [place], [place] [address]    Report Status 11/24/2019 FINAL  Final  Culture, respiratory (non-expectorated)     Status: None   Collection Time: 12/04/19  2:22 AM   Specimen: Tracheal Aspirate; Respiratory  Result Value Ref Range Status   Specimen Description TRACHEAL ASPIRATE  Final   Special Requests NONE  Final   Gram Stain   Final    ABUNDANT WBC PRESENT, PREDOMINANTLY PMN ABUNDANT GRAM NEGATIVE RODS Performed at [facility], [address], [place], [place] [address]    Culture   Final    MODERATE PSEUDOMONAS AERUGINOSA FEW ESCHERICHIA COLI Confirmed Extended Spectrum Beta-Lactamase Producer (ESBL).  In bloodstream infections from ESBL organisms, carbapenems are preferred over piperacillin/tazobactam. They are shown to have a lower risk of mortality.    Report Status 12/09/2019 FINAL  Final   Organism ID, Bacteria PSEUDOMONAS AERUGINOSA  Final   Organism ID, Bacteria ESCHERICHIA COLI  Final      Susceptibility   Escherichia coli - MIC*    AMPICILLIN >=32 RESISTANT Resistant     CEFAZOLIN >=64 RESISTANT Resistant     CEFEPIME 2 SENSITIVE Sensitive     CEFTAZIDIME RESISTANT Resistant     CEFTRIAXONE >=64 RESISTANT Resistant     CIPROFLOXACIN <=0.25 SENSITIVE Sensitive     GENTAMICIN >=16 RESISTANT Resistant     IMIPENEM <=0.25 SENSITIVE Sensitive     TRIMETH/SULFA >=320 RESISTANT Resistant     AMPICILLIN/SULBACTAM 16 INTERMEDIATE Intermediate     PIP/TAZO <=4 SENSITIVE Sensitive     * FEW ESCHERICHIA COLI   Pseudomonas aeruginosa - MIC*    CEFTAZIDIME 16 INTERMEDIATE Intermediate     CIPROFLOXACIN >=4 RESISTANT Resistant     GENTAMICIN 2 SENSITIVE Sensitive     IMIPENEM >=16 RESISTANT Resistant     * MODERATE PSEUDOMONAS AERUGINOSA  Culture, Urine     Status: Abnormal   Collection Time: 12/04/19  6:33 PM   Specimen: Urine, Random  Result Value Ref Range Status   Specimen Description URINE, RANDOM  Final   Special Requests   Final    NONE Performed  at [facility], [address], [place], [place] [address]    Culture >=100,000 COLONIES/mL PSEUDOMONAS AERUGINOSA (A)  Final   Report Status 12/06/2019 FINAL  Final   Organism ID, Bacteria PSEUDOMONAS AERUGINOSA (A)  Final      Susceptibility   Pseudomonas aeruginosa - MIC*  CEFTAZIDIME 4 SENSITIVE Sensitive     CIPROFLOXACIN >=4 RESISTANT Resistant     GENTAMICIN 4 SENSITIVE Sensitive     IMIPENEM 2 SENSITIVE Sensitive     PIP/TAZO 16 SENSITIVE Sensitive     * >=100,000 COLONIES/mL PSEUDOMONAS AERUGINOSA  Culture, blood (routine x 2)     Status: Abnormal   Collection Time: 12/11/19  9:39 AM   Specimen: BLOOD LEFT HAND  Result Value Ref Range Status   Specimen Description BLOOD LEFT HAND  Final   Special Requests   Final    BOTTLES DRAWN AEROBIC AND ANAEROBIC Blood Culture adequate volume   Culture  Setup Time   Final    GRAM POSITIVE COCCI IN CLUSTERS ANAEROBIC BOTTLE ONLY CRITICAL RESULT CALLED TO, READ BACK BY AND VERIFIED WITH: RN R [place] [id number] AT 68 BY CM    Culture (A)  Final    STAPHYLOCOCCUS SPECIES (COAGULASE NEGATIVE) THE SIGNIFICANCE OF ISOLATING THIS ORGANISM FROM A SINGLE SET OF BLOOD CULTURES WHEN MULTIPLE SETS ARE DRAWN IS UNCERTAIN. PLEASE NOTIFY THE MICROBIOLOGY DEPARTMENT WITHIN ONE WEEK IF SPECIATION AND SENSITIVITIES ARE REQUIRED. Performed at [facility], [place] [address], [place], [place] [address]    Report Status 12/14/2019 FINAL  Final  Culture, blood (routine x 2)     Status: None   Collection Time: 12/11/19  9:47 AM   Specimen: BLOOD  Result Value Ref Range Status   Specimen Description BLOOD RIGHT ANTECUBITAL  Final   Special Requests   Final    BOTTLES DRAWN AEROBIC AND ANAEROBIC Blood Culture adequate volume   Culture  Setup Time   Final    CORRECTED RESULTS NO ORGANISMS SEEN PREVIOUSLY REPORTED AS: GRAM POSITIVE COCCI IN CLUSTERS CORRECTED RESULTS CALLED TO: RN R [place] [id number] AT 920 AM BY CM    Culture   Final    NO  GROWTH 5 DAYS Performed at [facility], [place] [address], [place], [place] [address]    Report Status 12/16/2019 FINAL  Final  Culture, respiratory (non-expectorated)     Status: None   Collection Time: 12/11/19 10:27 AM   Specimen: Tracheal Aspirate; Respiratory  Result Value Ref Range Status   Specimen Description TRACHEAL ASPIRATE  Final   Special Requests NONE  Final   Gram Stain NO WBC SEEN NO ORGANISMS SEEN   Final   Culture   Final    FEW PSEUDOMONAS AERUGINOSA FEW STENOTROPHOMONAS MALTOPHILIA SEE SEPARATE REPORT IN [facility] FOR DOS 12/11/19. Performed at [facility], [place] [address] [place], [place] [address]    Report Status 12/26/2019 FINAL  Final   Organism ID, Bacteria PSEUDOMONAS AERUGINOSA  Final   Organism ID, Bacteria STENOTROPHOMONAS MALTOPHILIA  Final      Susceptibility   Pseudomonas aeruginosa - MIC*    CEFTAZIDIME 16 INTERMEDIATE Intermediate     CIPROFLOXACIN >=4 RESISTANT Resistant     GENTAMICIN 2 SENSITIVE Sensitive     IMIPENEM >=16 RESISTANT Resistant     * FEW PSEUDOMONAS AERUGINOSA   Stenotrophomonas maltophilia - MIC*    LEVOFLOXACIN 0.5 SENSITIVE Sensitive     TRIMETH/SULFA <=20 SENSITIVE Sensitive     * FEW STENOTROPHOMONAS MALTOPHILIA  Culture, Urine     Status: Abnormal   Collection Time: 12/11/19 11:30 AM   Specimen: Urine, Random  Result Value Ref Range Status   Specimen Description URINE, RANDOM  Final   Special Requests NONE  Final   Culture (A)  Final    <10,000 COLONIES/mL INSIGNIFICANT  GROWTH Performed at [facility], [place] [address] [place], [place] [address]    Report Status 12/12/2019 FINAL  Final  Culture, blood (routine x 2)     Status: None   Collection Time: 12/15/19 12:37 PM   Specimen: BLOOD LEFT HAND  Result Value Ref Range Status   Specimen Description BLOOD LEFT HAND  Final   Special Requests   Final    BOTTLES DRAWN AEROBIC AND ANAEROBIC Blood Culture adequate volume   Culture   Final    NO GROWTH 5  DAYS Performed at [facility], [place] [address] [place], [place] [address]    Report Status 12/20/2019 FINAL  Final  Culture, blood (routine x 2)     Status: None   Collection Time: 12/15/19 12:42 PM   Specimen: BLOOD LEFT HAND  Result Value Ref Range Status   Specimen Description BLOOD LEFT HAND  Final   Special Requests   Final    BOTTLES DRAWN AEROBIC AND ANAEROBIC Blood Culture adequate volume   Culture   Final    NO GROWTH 5 DAYS Performed at [facility], [place] [address] [place], [place] [address]    Report Status 12/20/2019 FINAL  Final  C Difficile Quick Screen w PCR reflex     Status: None   Collection Time: 12/17/19  7:00 PM  Result Value Ref Range Status   C Diff antigen NEGATIVE NEGATIVE Final   C Diff toxin NEGATIVE NEGATIVE Final   C Diff interpretation No C. difficile detected.  Final    Comment: Performed at [facility], [place] [address] [place], [place] [address]  SARS CORONAVIRUS 2 (TAT 6-24 HRS) Nasopharyngeal Nasopharyngeal Swab     Status: None   Collection Time: 12/20/19 11:45 AM   Specimen: Nasopharyngeal Swab  Result Value Ref Range Status   SARS Coronavirus 2 NEGATIVE NEGATIVE Final    Comment: (NOTE) SARS-CoV-2 target nucleic acids are NOT DETECTED. The SARS-CoV-2 RNA is generally detectable in upper and lower respiratory specimens during the acute phase of infection. Negative results do not preclude SARS-CoV-2 infection, do not rule out co-infections with other pathogens, and should not be used as the sole basis for treatment or other patient management decisions. Negative results must be combined with clinical observations, patient history, and epidemiological information. The expected result is Negative. Fact Sheet for Patients: SugarRoll.be Fact Sheet for Healthcare Providers: https://www.woods-mathews.com/ This test is not yet approved or cleared by the [place] FDA and  has been  authorized for detection and/or diagnosis of SARS-CoV-2 by FDA under an Emergency Use Authorization (EUA). This EUA will remain  in effect (meaning this test can be used) for the duration of the COVID-19 declaration under Section 56 4(b)(1) of the Act, 21 U.S.C. section 360bbb-3(b)(1), unless the authorization is terminated or revoked sooner. Performed at [facility], [place] [address], [place], [place] [address]   Body fluid culture     Status: None   Collection Time: 12/21/19 12:10 PM   Specimen: Lung, Left; Pleural Fluid  Result Value Ref Range Status   Specimen Description PLEURAL FLUID  Final   Special Requests LEFT LUNG THORA  Final   Gram Stain   Final    RARE WBC PRESENT, PREDOMINANTLY MONONUCLEAR NO ORGANISMS SEEN    Culture   Final    NO GROWTH 3 DAYS Performed at [facility], [place] [address] [place], [place] [address]    Report Status 12/24/2019  FINAL  Final  Culture, blood (routine x 2)     Status: None (Preliminary result)   Collection Time: 12/25/19  2:16 PM   Specimen: BLOOD RIGHT HAND  Result Value Ref Range Status   Specimen Description BLOOD RIGHT HAND  Final   Special Requests   Final    BOTTLES DRAWN AEROBIC ONLY Blood Culture adequate volume   Culture   Final    NO GROWTH 3 DAYS Performed at [facility], [address], [place], [place] [address]    Report Status PENDING  Incomplete  Culture, blood (routine x 2)     Status: None (Preliminary result)   Collection Time: 12/25/19  2:18 PM   Specimen: BLOOD LEFT HAND  Result Value Ref Range Status   Specimen Description BLOOD LEFT HAND  Final   Special Requests   Final    BOTTLES DRAWN AEROBIC ONLY Blood Culture adequate volume   Culture   Final    NO GROWTH 3 DAYS Performed at [facility], [place] [address], [place], [place] [address]    Report Status PENDING  Incomplete  Culture, respiratory (non-expectorated)     Status: None   Collection Time: 12/25/19  4:20 PM   Specimen:  Tracheal Aspirate; Respiratory  Result Value Ref Range Status   Specimen Description TRACHEAL ASPIRATE  Final   Special Requests NONE  Final   Gram Stain   Final    ABUNDANT WBC PRESENT, PREDOMINANTLY PMN NO ORGANISMS SEEN    Culture   Final    RARE Consistent with normal respiratory flora. Performed at [facility], [place] [address] [place], [place] [address]    Report Status 12/28/2019 FINAL  Final    Coagulation Studies: No results for input(s): LABPROT, INR in the last 72 hours.  Urinalysis: No results for input(s): COLORURINE, LABSPEC, PHURINE, GLUCOSEU, HGBUR, BILIRUBINUR, KETONESUR, PROTEINUR, UROBILINOGEN, NITRITE, LEUKOCYTESUR in the last 72 hours.  Invalid input(s): APPERANCEUR    Imaging: DG CHEST PORT 1 VIEW  Result Date: 12/29/2019 CLINICAL DATA:  74 year old male with history of pleural effusion. EXAM: PORTABLE CHEST 1 VIEW COMPARISON:  Chest x-ray 12/25/2019. FINDINGS: An endotracheal tube is in place with tip 6.6 cm above the carina. Right IJ Vas-Cath with tip terminating in the right atrium. Lung volumes are low. Widespread areas of interstitial prominence, architectural distortion and patchy airspace consolidation in the lungs bilaterally (left greater than right). Air bronchograms in the left lower lobe. Small bilateral pleural effusions. Overall, aeration appears slightly worsened compared to the prior study. Pulmonary vasculature is obscured. Heart size appears mildly enlarged. Upper mediastinal contours are within normal limits allowing for patient positioning. Aortic atherosclerosis. IMPRESSION: 1. Slightly worsened aeration throughout the lungs bilaterally, with findings again suggestive of multilobar pneumonia. 2. Small bilateral pleural effusions are unchanged. 3. Mild cardiomegaly. 4. Aortic atherosclerosis. Electronically Signed   By: Vinnie Langton M.D.   On: 12/29/2019 05:19     Medications:     heparin, lidocaine, lidocaine  Assessment/ Plan:   74 y.o. male with a PMHx of acute respiratory failure status post COVID-19 infection status post tracheostomy placement, atrial fibrillation, hypertension, CVA, gout, hypertrophic cardiomyopathy, who was admitted to [facility] 11/21/2019 for ongoing treatment of acute respiratory failure.  1.  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  Patient seen and evaluated during dialysis treatment today.  Tolerating well.  He remains dialysis dependent at this time.  Continue to monitor renal parameters.  We  will plan for next dialysis treatment on Monday.  2.  Acute respiratory failure secondary to COVID-19 infection and its sequela.  -Continues to require ventilatory support.  Weaning protocol as per pulmonary/critical care.  3.  Hyperkalemia.  Potassium now acceptable at 4.1.  4.  Hyponatremia.  Serum sodium improved to 132 with ongoing dialysis treatments.  Continue to monitor serum sodium.     LOS: 0 Aidenjames Heckmann 4/2/202110:10 AM

## 2019-12-29 NOTE — Progress Notes (Signed)
Pulmonary Critical Care Medicine [facility]   PULMONARY CRITICAL CARE SERVICE  PROGRESS NOTE  Date of Service: 12/29/2019  Jay Haskew  JJH:[id number]  DOB: 09-Sep-1946   DOA: 11/21/2019  Referring Physician: [name], MD  HPI: Gor Vestal is a 74 y.o. male seen for follow up of Acute on Chronic Respiratory Failure.  Patient is on assist control mode currently on 55% FiO2 with a PEEP of 9  Medications: Reviewed on Rounds  Physical Exam:  Vitals: Temperature is 98.1 pulse 76 respiratory rate was 30 blood pressure is 122/63 saturations 97%  Ventilator Settings on assist control FiO2 of 55% tidal volume 350 PEEP 9  . General: Comfortable at this time . Eyes: Grossly normal lids, irises & conjunctiva . ENT: grossly tongue is normal . Neck: no obvious mass . Cardiovascular: S1 S2 normal no gallop . Respiratory: Coarse rhonchi expansion is equal . Abdomen: soft . Skin: no rash seen on limited exam . Musculoskeletal: not rigid . Psychiatric:unable to assess . Neurologic: no seizure no involuntary movements         Lab Data:   Basic Metabolic Panel: Recent Labs  Lab 12/23/19 0753 12/24/19 0823 12/25/19 0628 12/27/19 0506 12/29/19 0627  NA  --  131* 128* 130* 132*  K  --  4.1 4.2 3.7 4.1  CL  --  94* 92* 92* 95*  CO2  --  26 25 26 25   GLUCOSE  --  123* 82 126* 151*  BUN  --  96* 112* 101* 76*  CREATININE  --  2.45* 2.74* 2.32* 2.25*  CALCIUM  --  8.6* 8.7* 9.2 8.5*  MG 2.0 2.1  --  2.0 2.0  PHOS  --  3.6 3.8 3.5 3.9    ABG: No results for input(s): PHART, PCO2ART, PO2ART, HCO3, O2SAT in the last 168 hours.  Liver Function Tests: Recent Labs  Lab 12/24/19 0823 12/25/19 0628 12/27/19 0506 12/29/19 0627  ALBUMIN 1.5* 1.5* 1.8* 1.8*   No results for input(s): LIPASE, AMYLASE in the last 168 hours. No results for input(s): AMMONIA in the last 168 hours.  CBC: Recent Labs  Lab 12/24/19 0823 12/25/19 0628 12/27/19 0506 12/29/19 0627   WBC 22.9* 26.9* 22.5* 18.8*  HGB 7.0* 8.1* 7.6* 7.3*  HCT 22.1* 24.6* 23.7* 23.1*  MCV 86.3 85.4 86.2 87.5  PLT 133* 178 249 274    Cardiac Enzymes: No results for input(s): CKTOTAL, CKMB, CKMBINDEX, TROPONINI in the last 168 hours.  BNP (last 3 results) No results for input(s): BNP in the last 8760 hours.  ProBNP (last 3 results) No results for input(s): PROBNP in the last 8760 hours.  Radiological Exams: DG CHEST PORT 1 VIEW  Result Date: 12/29/2019 CLINICAL DATA:  74 year old male with history of pleural effusion. EXAM: PORTABLE CHEST 1 VIEW COMPARISON:  Chest x-ray 12/25/2019. FINDINGS: An endotracheal tube is in place with tip 6.6 cm above the carina. Right IJ Vas-Cath with tip terminating in the right atrium. Lung volumes are low. Widespread areas of interstitial prominence, architectural distortion and patchy airspace consolidation in the lungs bilaterally (left greater than right). Air bronchograms in the left lower lobe. Small bilateral pleural effusions. Overall, aeration appears slightly worsened compared to the prior study. Pulmonary vasculature is obscured. Heart size appears mildly enlarged. Upper mediastinal contours are within normal limits allowing for patient positioning. Aortic atherosclerosis. IMPRESSION: 1. Slightly worsened aeration throughout the lungs bilaterally, with findings again suggestive of multilobar pneumonia. 2. Small bilateral pleural effusions are unchanged.  3. Mild cardiomegaly. 4. Aortic atherosclerosis. Electronically Signed   By: Vinnie Langton M.D.   On: 12/29/2019 05:19    Assessment/Plan Active Problems:   Acute on chronic respiratory failure with hypoxia (HCC)   COVID-19 virus infection   Atrial fibrillation with RVR (HCC)   Severe sepsis (HCC)   Pneumonia due to COVID-19 virus   1. Acute on chronic respiratory failure hypoxia plan is to continue with assist control mode titrate oxygen as tolerated 2. COVID-19 virus infection in  resolution phase 3. Atrial fibrillation with RVR treated now rate controlled 4. Severe sepsis resolved 5. Pneumonia due to COVID-19 treated we will continue to monitor   I have personally seen and evaluated the patient, evaluated laboratory and imaging results, formulated the assessment and plan and placed orders. The Patient requires high complexity decision making with multiple systems involvement.  Rounds were done with the Respiratory Therapy Director and Staff therapists and discussed with nursing staff also.  Allyne Gee, MD [facility] Pulmonary Critical Care Medicine Sleep Medicine

## 2019-12-30 DIAGNOSIS — J9621 Acute and chronic respiratory failure with hypoxia: Secondary | ICD-10-CM | POA: Diagnosis not present

## 2019-12-30 DIAGNOSIS — I4891 Unspecified atrial fibrillation: Secondary | ICD-10-CM | POA: Diagnosis not present

## 2019-12-30 DIAGNOSIS — U071 COVID-19: Secondary | ICD-10-CM | POA: Diagnosis not present

## 2019-12-30 DIAGNOSIS — N179 Acute kidney failure, unspecified: Secondary | ICD-10-CM | POA: Diagnosis not present

## 2019-12-30 LAB — CBC
HCT: 25 % — ABNORMAL LOW (ref 39.0–52.0)
Hemoglobin: 7.7 g/dL — ABNORMAL LOW (ref 13.0–17.0)
MCH: 27.6 pg (ref 26.0–34.0)
MCHC: 30.8 g/dL (ref 30.0–36.0)
MCV: 89.6 fL (ref 80.0–100.0)
Platelets: 271 10*3/uL (ref 150–400)
RBC: 2.79 MIL/uL — ABNORMAL LOW (ref 4.22–5.81)
RDW: 18.9 % — ABNORMAL HIGH (ref 11.5–15.5)
WBC: 16.4 10*3/uL — ABNORMAL HIGH (ref 4.0–10.5)
nRBC: 0 % (ref 0.0–0.2)

## 2019-12-30 NOTE — Progress Notes (Signed)
Pulmonary Critical Care Medicine [place]   PULMONARY CRITICAL CARE SERVICE  PROGRESS NOTE  Date of Service: 12/30/2019  Shane Banks  VQM:[id number]  DOB: 09-21-1946   DOA: 11/21/2019  Referring Physician: [name], MD  HPI: Shane Banks is a 74 y.o. male seen for follow up of Acute on Chronic Respiratory Failure.  Remains on the ventilator on full support PEEP has been up 900 try to wean the FiO2 and PEEP down  Medications: Reviewed on Rounds  Physical Exam:  Vitals: Temperature is 97.3 pulse 74 respiratory rate 30 blood pressure is 127/68 saturations 100%  Ventilator Settings on assist control FiO2 is 50% tidal volume 350 PEEP 9  . General: Comfortable at this time . Eyes: Grossly normal lids, irises & conjunctiva . ENT: grossly tongue is normal . Neck: no obvious mass . Cardiovascular: S1 S2 normal no gallop . Respiratory: No rhonchi no rales . Abdomen: soft . Skin: no rash seen on limited exam . Musculoskeletal: not rigid . Psychiatric:unable to assess . Neurologic: no seizure no involuntary movements         Lab Data:   Basic Metabolic Panel: Recent Labs  Lab 12/24/19 0823 12/25/19 0628 12/27/19 0506 12/29/19 0627  NA 131* 128* 130* 132*  K 4.1 4.2 3.7 4.1  CL 94* 92* 92* 95*  CO2 26 25 26 25   GLUCOSE 123* 82 126* 151*  BUN 96* 112* 101* 76*  CREATININE 2.45* 2.74* 2.32* 2.25*  CALCIUM 8.6* 8.7* 9.2 8.5*  MG 2.1  --  2.0 2.0  PHOS 3.6 3.8 3.5 3.9    ABG: No results for input(s): PHART, PCO2ART, PO2ART, HCO3, O2SAT in the last 168 hours.  Liver Function Tests: Recent Labs  Lab 12/24/19 0823 12/25/19 0628 12/27/19 0506 12/29/19 0627  ALBUMIN 1.5* 1.5* 1.8* 1.8*   No results for input(s): LIPASE, AMYLASE in the last 168 hours. No results for input(s): AMMONIA in the last 168 hours.  CBC: Recent Labs  Lab 12/24/19 0823 12/25/19 0628 12/27/19 0506 12/29/19 0627 12/30/19 0641  WBC 22.9* 26.9* 22.5* 18.8* 16.4*   HGB 7.0* 8.1* 7.6* 7.3* 7.7*  HCT 22.1* 24.6* 23.7* 23.1* 25.0*  MCV 86.3 85.4 86.2 87.5 89.6  PLT 133* 178 249 274 271    Cardiac Enzymes: No results for input(s): CKTOTAL, CKMB, CKMBINDEX, TROPONINI in the last 168 hours.  BNP (last 3 results) No results for input(s): BNP in the last 8760 hours.  ProBNP (last 3 results) No results for input(s): PROBNP in the last 8760 hours.  Radiological Exams: DG CHEST PORT 1 VIEW  Result Date: 12/29/2019 CLINICAL DATA:  74 year old male with history of pleural effusion. EXAM: PORTABLE CHEST 1 VIEW COMPARISON:  Chest x-ray 12/25/2019. FINDINGS: An endotracheal tube is in place with tip 6.6 cm above the carina. Right IJ Vas-Cath with tip terminating in the right atrium. Lung volumes are low. Widespread areas of interstitial prominence, architectural distortion and patchy airspace consolidation in the lungs bilaterally (left greater than right). Air bronchograms in the left lower lobe. Small bilateral pleural effusions. Overall, aeration appears slightly worsened compared to the prior study. Pulmonary vasculature is obscured. Heart size appears mildly enlarged. Upper mediastinal contours are within normal limits allowing for patient positioning. Aortic atherosclerosis. IMPRESSION: 1. Slightly worsened aeration throughout the lungs bilaterally, with findings again suggestive of multilobar pneumonia. 2. Small bilateral pleural effusions are unchanged. 3. Mild cardiomegaly. 4. Aortic atherosclerosis. Electronically Signed   By: Vinnie Langton M.D.   On: 12/29/2019 05:19  Assessment/Plan Active Problems:   Acute on chronic respiratory failure with hypoxia (HCC)   COVID-19 virus infection   Atrial fibrillation with RVR (HCC)   Severe sepsis (HCC)   Pneumonia due to COVID-19 virus   1. Acute on chronic respiratory failure with hypoxia we will continue with full support on assist control mode PEEP level is at 9 titrate down titrate FiO2  down 2. COVID-19 virus infection in resolution phase x-rays not showing much improvement though 3. Atrial fibrillation rate controlled right now 4. Severe sepsis hemodynamics are stable 5. Pneumonia due to COVID-19 chest x-ray shows from worsening could be fluid spoke with primary care team regarding fluid status   I have personally seen and evaluated the patient, evaluated laboratory and imaging results, formulated the assessment and plan and placed orders. The Patient requires high complexity decision making with multiple systems involvement.  Rounds were done with the Respiratory Therapy Director and Staff therapists and discussed with nursing staff also.  Allyne Gee, MD [facility] Pulmonary Critical Care Medicine Sleep Medicine

## 2019-12-31 DIAGNOSIS — N179 Acute kidney failure, unspecified: Secondary | ICD-10-CM | POA: Diagnosis not present

## 2019-12-31 DIAGNOSIS — I4891 Unspecified atrial fibrillation: Secondary | ICD-10-CM | POA: Diagnosis not present

## 2019-12-31 DIAGNOSIS — J9621 Acute and chronic respiratory failure with hypoxia: Secondary | ICD-10-CM | POA: Diagnosis not present

## 2019-12-31 DIAGNOSIS — U071 COVID-19: Secondary | ICD-10-CM | POA: Diagnosis not present

## 2019-12-31 NOTE — Progress Notes (Signed)
Pulmonary Critical Care Medicine [place]   PULMONARY CRITICAL CARE SERVICE  PROGRESS NOTE  Date of Service: 12/31/2019  Shane Banks  [id number]:[id number]  DOB: Jul 18, 1946   DOA: 11/21/2019  Referring Physician: [name], MD  HPI: Shane Banks is a 74 y.o. male seen for follow up of Acute on Chronic Respiratory Failure.  Patient is on full support on assist control mode currently on 45% FiO2 PEEP has been decreased down to 9  Medications: Reviewed on Rounds  Physical Exam:  Vitals: Temperature is 98.6 pulse 77 respiratory rate 28 blood pressure is 133/79 saturations 99%  Ventilator Settings on assist control FiO2 45% tidal volume is 377 PEEP 9  . General: Comfortable at this time . Eyes: Grossly normal lids, irises & conjunctiva . ENT: grossly tongue is normal . Neck: no obvious mass . Cardiovascular: S1 S2 normal no gallop . Respiratory: Coarse rhonchi expansion is equal . Abdomen: soft . Skin: no rash seen on limited exam . Musculoskeletal: not rigid . Psychiatric:unable to assess . Neurologic: no seizure no involuntary movements         Lab Data:   Basic Metabolic Panel: Recent Labs  Lab 12/25/19 0628 12/27/19 0506 12/29/19 0627  NA 128* 130* 132*  K 4.2 3.7 4.1  CL 92* 92* 95*  CO2 25 26 25   GLUCOSE 82 126* 151*  BUN 112* 101* 76*  CREATININE 2.74* 2.32* 2.25*  CALCIUM 8.7* 9.2 8.5*  MG  --  2.0 2.0  PHOS 3.8 3.5 3.9    ABG: No results for input(s): PHART, PCO2ART, PO2ART, HCO3, O2SAT in the last 168 hours.  Liver Function Tests: Recent Labs  Lab 12/25/19 0628 12/27/19 0506 12/29/19 0627  ALBUMIN 1.5* 1.8* 1.8*   No results for input(s): LIPASE, AMYLASE in the last 168 hours. No results for input(s): AMMONIA in the last 168 hours.  CBC: Recent Labs  Lab 12/25/19 0628 12/27/19 0506 12/29/19 0627 12/30/19 0641  WBC 26.9* 22.5* 18.8* 16.4*  HGB 8.1* 7.6* 7.3* 7.7*  HCT 24.6* 23.7* 23.1* 25.0*  MCV 85.4 86.2 87.5 89.6   PLT 178 249 274 271    Cardiac Enzymes: No results for input(s): CKTOTAL, CKMB, CKMBINDEX, TROPONINI in the last 168 hours.  BNP (last 3 results) No results for input(s): BNP in the last 8760 hours.  ProBNP (last 3 results) No results for input(s): PROBNP in the last 8760 hours.  Radiological Exams: No results found.  Assessment/Plan Active Problems:   Acute on chronic respiratory failure with hypoxia (HCC)   COVID-19 virus infection   Atrial fibrillation with RVR (HCC)   Severe sepsis (HCC)   Pneumonia due to COVID-19 virus   1. Acute on chronic respiratory failure hypoxia we will continue with full support on the ventilator weaning PEEP down as tolerated 2. COVID-19 virus infection in recovery phase we will continue to monitor 3. Atrial fibrillation with RVR treated we will continue to follow 4. Severe sepsis hemodynamics are stable 5. Pneumonia due to COVID-19 clinically is improving we will follow along   I have personally seen and evaluated the patient, evaluated laboratory and imaging results, formulated the assessment and plan and placed orders. The Patient requires high complexity decision making with multiple systems involvement.  Rounds were done with the Respiratory Therapy Director and Staff therapists and discussed with nursing staff also.  Allyne Gee, MD [facility] Pulmonary Critical Care Medicine Sleep Medicine

## 2020-01-01 DIAGNOSIS — I4891 Unspecified atrial fibrillation: Secondary | ICD-10-CM | POA: Diagnosis not present

## 2020-01-01 DIAGNOSIS — N179 Acute kidney failure, unspecified: Secondary | ICD-10-CM | POA: Diagnosis not present

## 2020-01-01 DIAGNOSIS — J9621 Acute and chronic respiratory failure with hypoxia: Secondary | ICD-10-CM | POA: Diagnosis not present

## 2020-01-01 DIAGNOSIS — U071 COVID-19: Secondary | ICD-10-CM | POA: Diagnosis not present

## 2020-01-01 LAB — RENAL FUNCTION PANEL
Albumin: 1.9 g/dL — ABNORMAL LOW (ref 3.5–5.0)
Anion gap: 10 (ref 5–15)
BUN: 110 mg/dL — ABNORMAL HIGH (ref 8–23)
CO2: 27 mmol/L (ref 22–32)
Calcium: 9.1 mg/dL (ref 8.9–10.3)
Chloride: 97 mmol/L — ABNORMAL LOW (ref 98–111)
Creatinine, Ser: 2.67 mg/dL — ABNORMAL HIGH (ref 0.61–1.24)
GFR calc Af Amer: 26 mL/min — ABNORMAL LOW (ref >60)
GFR calc non Af Amer: 23 mL/min — ABNORMAL LOW (ref >60)
Glucose, Bld: 132 mg/dL — ABNORMAL HIGH (ref 70–99)
Phosphorus: 3.6 mg/dL (ref 2.5–4.6)
Potassium: 3.9 mmol/L (ref 3.5–5.1)
Sodium: 134 mmol/L — ABNORMAL LOW (ref 135–145)

## 2020-01-01 LAB — CBC
HCT: 24 % — ABNORMAL LOW (ref 39.0–52.0)
Hemoglobin: 7.7 g/dL — ABNORMAL LOW (ref 13.0–17.0)
MCH: 27.3 pg (ref 26.0–34.0)
MCHC: 32.1 g/dL (ref 30.0–36.0)
MCV: 85.1 fL (ref 80.0–100.0)
Platelets: 310 10*3/uL (ref 150–400)
RBC: 2.82 MIL/uL — ABNORMAL LOW (ref 4.22–5.81)
RDW: 19 % — ABNORMAL HIGH (ref 11.5–15.5)
WBC: 18.5 10*3/uL — ABNORMAL HIGH (ref 4.0–10.5)
nRBC: 0 % (ref 0.0–0.2)

## 2020-01-01 LAB — CULTURE, BLOOD (ROUTINE X 2)
Culture: NO GROWTH
Culture: NO GROWTH
Special Requests: ADEQUATE
Special Requests: ADEQUATE

## 2020-01-01 NOTE — Progress Notes (Signed)
Pulmonary Critical Care Medicine [place]   PULMONARY CRITICAL CARE SERVICE  PROGRESS NOTE  Date of Service: 01/01/2020  Husayn Reim  [id number]:[id number]  DOB: 16-Jan-1946   DOA: 11/21/2019  Referring Physician: [name], MD  HPI: Shane Banks is a 74 y.o. male seen for follow up of Acute on Chronic Respiratory Failure.  Patient is on pressure support mode currently 12/7  Medications: Reviewed on Rounds  Physical Exam:  Vitals: Temperature is 97.3 pulse 73 respiratory 31 blood pressure is 137/76 saturations 98%  Ventilator Settings on pressure support FiO2 40% pressure 12 PEEP 7  . General: Comfortable at this time . Eyes: Grossly normal lids, irises & conjunctiva . ENT: grossly tongue is normal . Neck: no obvious mass . Cardiovascular: S1 S2 normal no gallop . Respiratory: No rhonchi no rales are noted at this time . Abdomen: soft . Skin: no rash seen on limited exam . Musculoskeletal: not rigid . Psychiatric:unable to assess . Neurologic: no seizure no involuntary movements         Lab Data:   Basic Metabolic Panel: Recent Labs  Lab 12/27/19 0506 12/29/19 0627 01/01/20 0811  NA 130* 132* 134*  K 3.7 4.1 3.9  CL 92* 95* 97*  CO2 26 25 27   GLUCOSE 126* 151* 132*  BUN 101* 76* 110*  CREATININE 2.32* 2.25* 2.67*  CALCIUM 9.2 8.5* 9.1  MG 2.0 2.0  --   PHOS 3.5 3.9 3.6    ABG: No results for input(s): PHART, PCO2ART, PO2ART, HCO3, O2SAT in the last 168 hours.  Liver Function Tests: Recent Labs  Lab 12/27/19 0506 12/29/19 0627 01/01/20 0811  ALBUMIN 1.8* 1.8* 1.9*   No results for input(s): LIPASE, AMYLASE in the last 168 hours. No results for input(s): AMMONIA in the last 168 hours.  CBC: Recent Labs  Lab 12/27/19 0506 12/29/19 0627 12/30/19 0641 01/01/20 0806  WBC 22.5* 18.8* 16.4* 18.5*  HGB 7.6* 7.3* 7.7* 7.7*  HCT 23.7* 23.1* 25.0* 24.0*  MCV 86.2 87.5 89.6 85.1  PLT 249 274 271 310    Cardiac Enzymes: No results  for input(s): CKTOTAL, CKMB, CKMBINDEX, TROPONINI in the last 168 hours.  BNP (last 3 results) No results for input(s): BNP in the last 8760 hours.  ProBNP (last 3 results) No results for input(s): PROBNP in the last 8760 hours.  Radiological Exams: No results found.  Assessment/Plan Active Problems:   Acute on chronic respiratory failure with hypoxia (HCC)   COVID-19 virus infection   Atrial fibrillation with RVR (HCC)   Severe sepsis (HCC)   Pneumonia due to COVID-19 virus   1. Acute on chronic respiratory failure hypoxia we will continue with pressure support mode titrate oxygen continue pulmonary toilet 2. COVID-19 virus infection in resolution phase we will continue with supportive care 3. Atrial fibrillation rate controlled 4. Severe sepsis resolved 5. Pneumonia due to COVID-19 treated we will continue to follow along   I have personally seen and evaluated the patient, evaluated laboratory and imaging results, formulated the assessment and plan and placed orders. The Patient requires high complexity decision making with multiple systems involvement.  Rounds were done with the Respiratory Therapy Director and Staff therapists and discussed with nursing staff also.  Allyne Gee, MD [facility] Pulmonary Critical Care Medicine Sleep Medicine

## 2020-01-01 NOTE — Progress Notes (Signed)
Let me know [facility]  ROUNDING NOTE   Subjective:  Patient seen and evaluated at bedside. Remains critically ill. Still on the ventilator. Due for dialysis today.   Objective:  Vital signs in last 24 hours:  Temperature 97.3 pulse 75 respirations 46 blood pressure 138/76  Physical Exam: General: Critically ill-appearing  Head: Normocephalic, atraumatic. Moist oral mucosal membranes  Eyes: Anicteric  Neck: Tracheostomy in place  Lungs:  Scattered rhonchi, vent assisted, tachypneic  Heart: S1S2 no rubs  Abdomen:  Soft, nontender, bowel sounds present, PEG tube in place  Extremities: 2+ peripheral edema.  Neurologic: Not following commands  Skin: No rash  Access: Right IJ temporary dialysis catheter    Basic Metabolic Panel: Recent Labs  Lab 12/27/19 0506 12/29/19 0627  NA 130* 132*  K 3.7 4.1  CL 92* 95*  CO2 26 25  GLUCOSE 126* 151*  BUN 101* 76*  CREATININE 2.32* 2.25*  CALCIUM 9.2 8.5*  MG 2.0 2.0  PHOS 3.5 3.9    Liver Function Tests: Recent Labs  Lab 12/27/19 0506 12/29/19 0627  ALBUMIN 1.8* 1.8*   No results for input(s): LIPASE, AMYLASE in the last 168 hours. No results for input(s): AMMONIA in the last 168 hours.  CBC: Recent Labs  Lab 12/27/19 0506 12/29/19 0627 12/30/19 0641  WBC 22.5* 18.8* 16.4*  HGB 7.6* 7.3* 7.7*  HCT 23.7* 23.1* 25.0*  MCV 86.2 87.5 89.6  PLT 249 274 271    Cardiac Enzymes: No results for input(s): CKTOTAL, CKMB, CKMBINDEX, TROPONINI in the last 168 hours.  BNP: Invalid input(s): POCBNP  CBG: No results for input(s): GLUCAP in the last 168 hours.  Microbiology: Results for orders placed or performed during the hospital encounter of 11/21/19  Culture, Urine     Status: Abnormal   Collection Time: 11/22/19  1:25 PM   Specimen: Urine, Random  Result Value Ref Range Status   Specimen Description URINE, RANDOM  Final   Special Requests   Final    NONE Performed at [facility],  [address] [place], [place] [address]    Culture 50,000 COLONIES/mL YEAST (A)  Final   Report Status 11/23/2019 FINAL  Final  Culture, blood (routine x 2)     Status: None   Collection Time: 11/22/19  1:40 PM   Specimen: BLOOD LEFT HAND  Result Value Ref Range Status   Specimen Description BLOOD LEFT HAND  Final   Special Requests   Final    BOTTLES DRAWN AEROBIC ONLY Blood Culture results may not be optimal due to an inadequate volume of blood received in culture bottles   Culture   Final    NO GROWTH 5 DAYS Performed at [facility], [place] [address], [place], [place] [address]    Report Status 11/27/2019 FINAL  Final  Culture, blood (routine x 2)     Status: None   Collection Time: 11/22/19  1:44 PM   Specimen: BLOOD LEFT HAND  Result Value Ref Range Status   Specimen Description BLOOD LEFT HAND  Final   Special Requests   Final    BOTTLES DRAWN AEROBIC ONLY Blood Culture results may not be optimal due to an inadequate volume of blood received in culture bottles   Culture   Final    NO GROWTH 5 DAYS Performed at [facility], [place] [address], [place], [place] [address]    Report Status 11/27/2019 FINAL  Final  Culture, respiratory (non-expectorated)  Status: None   Collection Time: 11/22/19  4:59 PM   Specimen: Tracheal Aspirate; Respiratory  Result Value Ref Range Status   Specimen Description TRACHEAL ASPIRATE  Final   Special Requests NONE  Final   Gram Stain   Final    ABUNDANT WBC PRESENT, PREDOMINANTLY PMN NO ORGANISMS SEEN    Culture   Final    NO GROWTH 2 DAYS Performed at [facility], [address] [place], [place] [address]    Report Status 11/24/2019 FINAL  Final  Culture, respiratory (non-expectorated)     Status: None   Collection Time: 12/04/19  2:22 AM   Specimen: Tracheal Aspirate; Respiratory  Result Value Ref Range Status   Specimen Description TRACHEAL ASPIRATE  Final   Special Requests NONE  Final   Gram Stain   Final     ABUNDANT WBC PRESENT, PREDOMINANTLY PMN ABUNDANT GRAM NEGATIVE RODS Performed at [facility], [address], [place], [place] [address]    Culture   Final    MODERATE PSEUDOMONAS AERUGINOSA FEW ESCHERICHIA COLI Confirmed Extended Spectrum Beta-Lactamase Producer (ESBL).  In bloodstream infections from ESBL organisms, carbapenems are preferred over piperacillin/tazobactam. They are shown to have a lower risk of mortality.    Report Status 12/09/2019 FINAL  Final   Organism ID, Bacteria PSEUDOMONAS AERUGINOSA  Final   Organism ID, Bacteria ESCHERICHIA COLI  Final      Susceptibility   Escherichia coli - MIC*    AMPICILLIN >=32 RESISTANT Resistant     CEFAZOLIN >=64 RESISTANT Resistant     CEFEPIME 2 SENSITIVE Sensitive     CEFTAZIDIME RESISTANT Resistant     CEFTRIAXONE >=64 RESISTANT Resistant     CIPROFLOXACIN <=0.25 SENSITIVE Sensitive     GENTAMICIN >=16 RESISTANT Resistant     IMIPENEM <=0.25 SENSITIVE Sensitive     TRIMETH/SULFA >=320 RESISTANT Resistant     AMPICILLIN/SULBACTAM 16 INTERMEDIATE Intermediate     PIP/TAZO <=4 SENSITIVE Sensitive     * FEW ESCHERICHIA COLI   Pseudomonas aeruginosa - MIC*    CEFTAZIDIME 16 INTERMEDIATE Intermediate     CIPROFLOXACIN >=4 RESISTANT Resistant     GENTAMICIN 2 SENSITIVE Sensitive     IMIPENEM >=16 RESISTANT Resistant     * MODERATE PSEUDOMONAS AERUGINOSA  Culture, Urine     Status: Abnormal   Collection Time: 12/04/19  6:33 PM   Specimen: Urine, Random  Result Value Ref Range Status   Specimen Description URINE, RANDOM  Final   Special Requests   Final    NONE Performed at [facility], [address] [place], [place] [address]    Culture >=100,000 COLONIES/mL PSEUDOMONAS AERUGINOSA (A)  Final   Report Status 12/06/2019 FINAL  Final   Organism ID, Bacteria PSEUDOMONAS AERUGINOSA (A)  Final      Susceptibility   Pseudomonas aeruginosa - MIC*    CEFTAZIDIME 4 SENSITIVE Sensitive     CIPROFLOXACIN >=4 RESISTANT  Resistant     GENTAMICIN 4 SENSITIVE Sensitive     IMIPENEM 2 SENSITIVE Sensitive     PIP/TAZO 16 SENSITIVE Sensitive     * >=100,000 COLONIES/mL PSEUDOMONAS AERUGINOSA  Culture, blood (routine x 2)     Status: Abnormal   Collection Time: 12/11/19  9:39 AM   Specimen: BLOOD LEFT HAND  Result Value Ref Range Status   Specimen Description BLOOD LEFT HAND  Final   Special Requests   Final    BOTTLES DRAWN AEROBIC AND ANAEROBIC Blood Culture adequate  volume   Culture  Setup Time   Final    GRAM POSITIVE COCCI IN CLUSTERS ANAEROBIC BOTTLE ONLY CRITICAL RESULT CALLED TO, READ BACK BY AND VERIFIED WITH: RN R Wilson [id number] AT 11 BY CM    Culture (A)  Final    STAPHYLOCOCCUS SPECIES (COAGULASE NEGATIVE) THE SIGNIFICANCE OF ISOLATING THIS ORGANISM FROM A SINGLE SET OF BLOOD CULTURES WHEN MULTIPLE SETS ARE DRAWN IS UNCERTAIN. PLEASE NOTIFY THE MICROBIOLOGY DEPARTMENT WITHIN ONE WEEK IF SPECIATION AND SENSITIVITIES ARE REQUIRED. Performed at [facility], [place] [address], [place], [place] [address]    Report Status 12/14/2019 FINAL  Final  Culture, blood (routine x 2)     Status: None   Collection Time: 12/11/19  9:47 AM   Specimen: BLOOD  Result Value Ref Range Status   Specimen Description BLOOD RIGHT ANTECUBITAL  Final   Special Requests   Final    BOTTLES DRAWN AEROBIC AND ANAEROBIC Blood Culture adequate volume   Culture  Setup Time   Final    CORRECTED RESULTS NO ORGANISMS SEEN PREVIOUSLY REPORTED AS: GRAM POSITIVE COCCI IN CLUSTERS CORRECTED RESULTS CALLED TO: RN R CUMMINGS [id number] AT 920 AM BY CM    Culture   Final    NO GROWTH 5 DAYS Performed at [facility], [place] [address] [place], [place] [address]    Report Status 12/16/2019 FINAL  Final  Culture, respiratory (non-expectorated)     Status: None   Collection Time: 12/11/19 10:27 AM   Specimen: Tracheal Aspirate; Respiratory  Result Value Ref Range Status   Specimen Description TRACHEAL ASPIRATE  Final    Special Requests NONE  Final   Gram Stain NO WBC SEEN NO ORGANISMS SEEN   Final   Culture   Final    FEW PSEUDOMONAS AERUGINOSA FEW STENOTROPHOMONAS MALTOPHILIA SEE SEPARATE REPORT IN [facility] FOR DOS 12/11/19. Performed at [facility], [place] [address] [place], [place] [address]    Report Status 12/26/2019 FINAL  Final   Organism ID, Bacteria PSEUDOMONAS AERUGINOSA  Final   Organism ID, Bacteria STENOTROPHOMONAS MALTOPHILIA  Final      Susceptibility   Pseudomonas aeruginosa - MIC*    CEFTAZIDIME 16 INTERMEDIATE Intermediate     CIPROFLOXACIN >=4 RESISTANT Resistant     GENTAMICIN 2 SENSITIVE Sensitive     IMIPENEM >=16 RESISTANT Resistant     * FEW PSEUDOMONAS AERUGINOSA   Stenotrophomonas maltophilia - MIC*    LEVOFLOXACIN 0.5 SENSITIVE Sensitive     TRIMETH/SULFA <=20 SENSITIVE Sensitive     * FEW STENOTROPHOMONAS MALTOPHILIA  Culture, Urine     Status: Abnormal   Collection Time: 12/11/19 11:30 AM   Specimen: Urine, Random  Result Value Ref Range Status   Specimen Description URINE, RANDOM  Final   Special Requests NONE  Final   Culture (A)  Final    <10,000 COLONIES/mL INSIGNIFICANT GROWTH Performed at [facility], [place] [place] [address], [place], [place] [address]    Report Status 12/12/2019 FINAL  Final  Culture, blood (routine x 2)     Status: None   Collection Time: 12/15/19 12:37 PM   Specimen: BLOOD LEFT HAND  Result Value Ref Range Status   Specimen Description BLOOD LEFT HAND  Final   Special Requests   Final    BOTTLES DRAWN AEROBIC AND ANAEROBIC Blood Culture adequate volume   Culture   Final    NO GROWTH 5 DAYS Performed at [facility], [place]  [address] [place], [place] [address]    Report Status 12/20/2019 FINAL  Final  Culture, blood (routine x 2)     Status: None   Collection Time: 12/15/19 12:42 PM   Specimen: BLOOD LEFT HAND  Result Value Ref Range Status   Specimen Description BLOOD LEFT HAND  Final   Special Requests   Final    BOTTLES  DRAWN AEROBIC AND ANAEROBIC Blood Culture adequate volume   Culture   Final    NO GROWTH 5 DAYS Performed at [facility], [place] [address] [place], [place] [address]    Report Status 12/20/2019 FINAL  Final  C Difficile Quick Screen w PCR reflex     Status: None   Collection Time: 12/17/19  7:00 PM  Result Value Ref Range Status   C Diff antigen NEGATIVE NEGATIVE Final   C Diff toxin NEGATIVE NEGATIVE Final   C Diff interpretation No C. difficile detected.  Final    Comment: Performed at [facility], [place] [address] [place], [place] [address]  SARS CORONAVIRUS 2 (TAT 6-24 HRS) Nasopharyngeal Nasopharyngeal Swab     Status: None   Collection Time: 12/20/19 11:45 AM   Specimen: Nasopharyngeal Swab  Result Value Ref Range Status   SARS Coronavirus 2 NEGATIVE NEGATIVE Final    Comment: (NOTE) SARS-CoV-2 target nucleic acids are NOT DETECTED. The SARS-CoV-2 RNA is generally detectable in upper and lower respiratory specimens during the acute phase of infection. Negative results do not preclude SARS-CoV-2 infection, do not rule out co-infections with other pathogens, and should not be used as the sole basis for treatment or other patient management decisions. Negative results must be combined with clinical observations, patient history, and epidemiological information. The expected result is Negative. Fact Sheet for Patients: SugarRoll.be Fact Sheet for Healthcare Providers: https://www.woods-mathews.com/ This test is not yet approved or cleared by the [place] FDA and  has been authorized for detection and/or diagnosis of SARS-CoV-2 by FDA under an Emergency Use Authorization (EUA). This EUA will remain  in effect (meaning this test can be used) for the duration of the COVID-19 declaration under Section 56 4(b)(1) of the Act, 21 U.S.C. section 360bbb-3(b)(1), unless the authorization is terminated or revoked sooner. Performed  at [facility], [place] [address], [place], [place] [address]   Body fluid culture     Status: None   Collection Time: 12/21/19 12:10 PM   Specimen: Lung, Left; Pleural Fluid  Result Value Ref Range Status   Specimen Description PLEURAL FLUID  Final   Special Requests LEFT LUNG THORA  Final   Gram Stain   Final    RARE WBC PRESENT, PREDOMINANTLY MONONUCLEAR NO ORGANISMS SEEN    Culture   Final    NO GROWTH 3 DAYS Performed at [facility], [place] [address], [place], [place] [address]    Report Status 12/24/2019 FINAL  Final  Culture, blood (routine x 2)     Status: None (Preliminary result)   Collection Time: 12/25/19  2:16 PM   Specimen: BLOOD RIGHT HAND  Result Value Ref Range Status   Specimen Description BLOOD RIGHT HAND  Final   Special Requests   Final    BOTTLES DRAWN AEROBIC ONLY Blood Culture adequate volume   Culture   Final    NO GROWTH 4 DAYS Performed at [facility], [place] [address] [place], [place] [address]    Report Status PENDING  Incomplete  Culture, blood (routine x 2)  Status: None (Preliminary result)   Collection Time: 12/25/19  2:18 PM   Specimen: BLOOD LEFT HAND  Result Value Ref Range Status   Specimen Description BLOOD LEFT HAND  Final   Special Requests   Final    BOTTLES DRAWN AEROBIC ONLY Blood Culture adequate volume   Culture   Final    NO GROWTH 4 DAYS Performed at [facility], [address] [place], [place] [address]    Report Status PENDING  Incomplete  Culture, respiratory (non-expectorated)     Status: None   Collection Time: 12/25/19  4:20 PM   Specimen: Tracheal Aspirate; Respiratory  Result Value Ref Range Status   Specimen Description TRACHEAL ASPIRATE  Final   Special Requests NONE  Final   Gram Stain   Final    ABUNDANT WBC PRESENT, PREDOMINANTLY PMN NO ORGANISMS SEEN    Culture   Final    RARE Consistent with normal respiratory flora. Performed at [facility], [place] [address],  [place], [place] [address]    Report Status 12/28/2019 FINAL  Final    Coagulation Studies: No results for input(s): LABPROT, INR in the last 72 hours.  Urinalysis: No results for input(s): COLORURINE, LABSPEC, PHURINE, GLUCOSEU, HGBUR, BILIRUBINUR, KETONESUR, PROTEINUR, UROBILINOGEN, NITRITE, LEUKOCYTESUR in the last 72 hours.  Invalid input(s): APPERANCEUR    Imaging: No results found.   Medications:     heparin, lidocaine, lidocaine  Assessment/ Plan:  74 y.o. male with a PMHx of acute respiratory failure status post COVID-19 infection status post tracheostomy placement, atrial fibrillation, hypertension, CVA, gout, hypertrophic cardiomyopathy, who was admitted to [facility] on 11/21/2019 for ongoing treatment of acute respiratory failure.  1.  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 Patient due for dialysis treatment today. 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 Repeat labs today.  2.  Acute respiratory failure secondary to COVID-19 infection and its sequela.  -Patient quite tachypneic today. We do plan for ultrafiltration with dialysis to aid with respiratory status.  3.  Hyperkalemia. Repeat serum potassium today.  4.  Hyponatremia. Repeat serum sodium this a.m.     LOS: 0 Linetta Regner 4/5/20218:19 AM

## 2020-01-02 DIAGNOSIS — N179 Acute kidney failure, unspecified: Secondary | ICD-10-CM | POA: Diagnosis not present

## 2020-01-02 DIAGNOSIS — I4891 Unspecified atrial fibrillation: Secondary | ICD-10-CM | POA: Diagnosis not present

## 2020-01-02 DIAGNOSIS — U071 COVID-19: Secondary | ICD-10-CM | POA: Diagnosis not present

## 2020-01-02 DIAGNOSIS — J9621 Acute and chronic respiratory failure with hypoxia: Secondary | ICD-10-CM | POA: Diagnosis not present

## 2020-01-02 NOTE — Progress Notes (Signed)
Pulmonary Critical Care Medicine [place]   PULMONARY CRITICAL CARE SERVICE  PROGRESS NOTE  Date of Service: 01/02/2020  Quanah Majka  HUD:[id number]  DOB: 11/30/1945   DOA: 11/21/2019  Referring Physician: [name], MD  HPI: Shane Banks is a 74 y.o. male seen for follow up of Acute on Chronic Respiratory Failure.  Patient currently is on pressure support with a 12-hour goal for weaning today  Medications: Reviewed on Rounds  Physical Exam:  Vitals: Temperature is 97.3 pulse 61 respiratory 38 blood pressure is 130/66 saturations 98%  Ventilator Settings on pressure support FiO2 40% pressure 12/7 tidal volume is 450  . General: Comfortable at this time . Eyes: Grossly normal lids, irises & conjunctiva . ENT: grossly tongue is normal . Neck: no obvious mass . Cardiovascular: S1 S2 normal no gallop . Respiratory: No rhonchi no rales are noted at this time . Abdomen: soft . Skin: no rash seen on limited exam . Musculoskeletal: not rigid . Psychiatric:unable to assess . Neurologic: no seizure no involuntary movements         Lab Data:   Basic Metabolic Panel: Recent Labs  Lab 12/27/19 0506 12/29/19 0627 01/01/20 0811  NA 130* 132* 134*  K 3.7 4.1 3.9  CL 92* 95* 97*  CO2 26 25 27   GLUCOSE 126* 151* 132*  BUN 101* 76* 110*  CREATININE 2.32* 2.25* 2.67*  CALCIUM 9.2 8.5* 9.1  MG 2.0 2.0  --   PHOS 3.5 3.9 3.6    ABG: No results for input(s): PHART, PCO2ART, PO2ART, HCO3, O2SAT in the last 168 hours.  Liver Function Tests: Recent Labs  Lab 12/27/19 0506 12/29/19 0627 01/01/20 0811  ALBUMIN 1.8* 1.8* 1.9*   No results for input(s): LIPASE, AMYLASE in the last 168 hours. No results for input(s): AMMONIA in the last 168 hours.  CBC: Recent Labs  Lab 12/27/19 0506 12/29/19 0627 12/30/19 0641 01/01/20 0806  WBC 22.5* 18.8* 16.4* 18.5*  HGB 7.6* 7.3* 7.7* 7.7*  HCT 23.7* 23.1* 25.0* 24.0*  MCV 86.2 87.5 89.6 85.1  PLT 249 274  271 310    Cardiac Enzymes: No results for input(s): CKTOTAL, CKMB, CKMBINDEX, TROPONINI in the last 168 hours.  BNP (last 3 results) No results for input(s): BNP in the last 8760 hours.  ProBNP (last 3 results) No results for input(s): PROBNP in the last 8760 hours.  Radiological Exams: No results found.  Assessment/Plan Active Problems:   Acute on chronic respiratory failure with hypoxia (HCC)   COVID-19 virus infection   Atrial fibrillation with RVR (HCC)   Severe sepsis (HCC)   Pneumonia due to COVID-19 virus   1. Acute on chronic respiratory failure with hypoxia we will continue to wean on pressure support titrate oxygen continue pulmonary toilet 2. COVID-19 virus infection resolved 3. Atrial fibrillation rate controlled 4. Severe sepsis hemodynamics are stable resolved 5. Pneumonia due to COVID-19 improving we will continue to monitor   I have personally seen and evaluated the patient, evaluated laboratory and imaging results, formulated the assessment and plan and placed orders. The Patient requires high complexity decision making with multiple systems involvement.  Rounds were done with the Respiratory Therapy Director and Staff therapists and discussed with nursing staff also.  Allyne Gee, MD [facility] Pulmonary Critical Care Medicine Sleep Medicine

## 2020-01-03 DIAGNOSIS — J9621 Acute and chronic respiratory failure with hypoxia: Secondary | ICD-10-CM | POA: Diagnosis not present

## 2020-01-03 DIAGNOSIS — U071 COVID-19: Secondary | ICD-10-CM | POA: Diagnosis not present

## 2020-01-03 DIAGNOSIS — I4891 Unspecified atrial fibrillation: Secondary | ICD-10-CM | POA: Diagnosis not present

## 2020-01-03 DIAGNOSIS — A419 Sepsis, unspecified organism: Secondary | ICD-10-CM | POA: Diagnosis not present

## 2020-01-03 LAB — RENAL FUNCTION PANEL
Albumin: 1.9 g/dL — ABNORMAL LOW (ref 3.5–5.0)
Anion gap: 10 (ref 5–15)
BUN: 77 mg/dL — ABNORMAL HIGH (ref 8–23)
CO2: 29 mmol/L (ref 22–32)
Calcium: 8.8 mg/dL — ABNORMAL LOW (ref 8.9–10.3)
Chloride: 96 mmol/L — ABNORMAL LOW (ref 98–111)
Creatinine, Ser: 2.15 mg/dL — ABNORMAL HIGH (ref 0.61–1.24)
GFR calc Af Amer: 34 mL/min — ABNORMAL LOW (ref >60)
GFR calc non Af Amer: 30 mL/min — ABNORMAL LOW (ref >60)
Glucose, Bld: 116 mg/dL — ABNORMAL HIGH (ref 70–99)
Phosphorus: 3.2 mg/dL (ref 2.5–4.6)
Potassium: 3.6 mmol/L (ref 3.5–5.1)
Sodium: 135 mmol/L (ref 135–145)

## 2020-01-03 LAB — CBC
HCT: 24.1 % — ABNORMAL LOW (ref 39.0–52.0)
Hemoglobin: 7.6 g/dL — ABNORMAL LOW (ref 13.0–17.0)
MCH: 27.1 pg (ref 26.0–34.0)
MCHC: 31.5 g/dL (ref 30.0–36.0)
MCV: 86.1 fL (ref 80.0–100.0)
Platelets: 296 10*3/uL (ref 150–400)
RBC: 2.8 MIL/uL — ABNORMAL LOW (ref 4.22–5.81)
RDW: 19.1 % — ABNORMAL HIGH (ref 11.5–15.5)
WBC: 19.3 10*3/uL — ABNORMAL HIGH (ref 4.0–10.5)
nRBC: 0 % (ref 0.0–0.2)

## 2020-01-03 NOTE — Progress Notes (Addendum)
Pulmonary Critical Care Medicine [place]   PULMONARY CRITICAL CARE SERVICE  PROGRESS NOTE  Date of Service: 01/03/2020  Matis Monnier  RKY:[id number]  DOB: 01/09/1946   DOA: 11/21/2019  Referring Physician: [name], MD  HPI: Dresean Beckel is a 74 y.o. male seen for follow up of Acute on Chronic Respiratory Failure.  Patient remains on pressure support with an FiO2 of 40% with a 16-hour goal today.  Satting well at this time.  Medications: Reviewed on Rounds  Physical Exam:  Vitals: Pulse 56 respirations 38 BP 143/67 O2 sat 99% temp 96.3  Ventilator Settings pressure support 12/7 FiO2 40%  . General: Comfortable at this time . Eyes: Grossly normal lids, irises & conjunctiva . ENT: grossly tongue is normal . Neck: no obvious mass . Cardiovascular: S1 S2 normal no gallop . Respiratory: No rales or rhonchi noted . Abdomen: soft . Skin: no rash seen on limited exam . Musculoskeletal: not rigid . Psychiatric:unable to assess . Neurologic: no seizure no involuntary movements         Lab Data:   Basic Metabolic Panel: Recent Labs  Lab 12/29/19 0627 01/01/20 0811 01/03/20 0900  NA 132* 134* 135  K 4.1 3.9 3.6  CL 95* 97* 96*  CO2 25 27 29   GLUCOSE 151* 132* 116*  BUN 76* 110* 77*  CREATININE 2.25* 2.67* 2.15*  CALCIUM 8.5* 9.1 8.8*  MG 2.0  --   --   PHOS 3.9 3.6 3.2    ABG: No results for input(s): PHART, PCO2ART, PO2ART, HCO3, O2SAT in the last 168 hours.  Liver Function Tests: Recent Labs  Lab 12/29/19 0627 01/01/20 0811 01/03/20 0900  ALBUMIN 1.8* 1.9* 1.9*   No results for input(s): LIPASE, AMYLASE in the last 168 hours. No results for input(s): AMMONIA in the last 168 hours.  CBC: Recent Labs  Lab 12/29/19 0627 12/30/19 0641 01/01/20 0806 01/03/20 0900  WBC 18.8* 16.4* 18.5* 19.3*  HGB 7.3* 7.7* 7.7* 7.6*  HCT 23.1* 25.0* 24.0* 24.1*  MCV 87.5 89.6 85.1 86.1  PLT 274 271 310 296    Cardiac Enzymes: No results for  input(s): CKTOTAL, CKMB, CKMBINDEX, TROPONINI in the last 168 hours.  BNP (last 3 results) No results for input(s): BNP in the last 8760 hours.  ProBNP (last 3 results) No results for input(s): PROBNP in the last 8760 hours.  Radiological Exams: No results found.  Assessment/Plan Active Problems:   Acute on chronic respiratory failure with hypoxia (HCC)   COVID-19 virus infection   Atrial fibrillation with RVR (HCC)   Severe sepsis (HCC)   Pneumonia due to COVID-19 virus   1. Acute on chronic respiratory failure with hypoxia we will continue to wean on pressure support titrate oxygen continue pulmonary toilet 2. COVID-19 virus infection resolved 3. Atrial fibrillation rate controlled 4. Severe sepsis hemodynamics are stable resolved 5. Pneumonia due to COVID-19 improving we will continue to monitor   I have personally seen and evaluated the patient, evaluated laboratory and imaging results, formulated the assessment and plan and placed orders. The Patient requires high complexity decision making with multiple systems involvement.  Rounds were done with the Respiratory Therapy Director and Staff therapists and discussed with nursing staff also.  Allyne Gee, MD [facility] Pulmonary Critical Care Medicine Sleep Medicine

## 2020-01-03 NOTE — Progress Notes (Signed)
Let me know [facility]  ROUNDING NOTE   Subjective:  Patient seen and evaluated during hemodialysis treatment. Tolerating well. Overall remains critically ill.  Objective:  Vital signs in last 24 hours:  Temperature 96.3 pulse 56 respirations 30 blood pressure 143/67.  Physical Exam: General: Critically ill-appearing  Head: Normocephalic, atraumatic. Moist oral mucosal membranes  Eyes: Anicteric  Neck: Tracheostomy in place  Lungs:  Scattered rhonchi, vent assisted, tachypneic  Heart: S1S2 no rubs  Abdomen:  Soft, nontender, bowel sounds present, PEG tube in place  Extremities: 3+ peripheral edema.  Neurologic: Not following commands  Skin: No rash  Access: Right IJ temporary dialysis catheter    Basic Metabolic Panel: Recent Labs  Lab 12/29/19 0627 01/01/20 0811 01/03/20 0900  NA 132* 134* 135  K 4.1 3.9 3.6  CL 95* 97* 96*  CO2 25 27 29   GLUCOSE 151* 132* 116*  BUN 76* 110* 77*  CREATININE 2.25* 2.67* 2.15*  CALCIUM 8.5* 9.1 8.8*  MG 2.0  --   --   PHOS 3.9 3.6 3.2    Liver Function Tests: Recent Labs  Lab 12/29/19 0627 01/01/20 0811 01/03/20 0900  ALBUMIN 1.8* 1.9* 1.9*   No results for input(s): LIPASE, AMYLASE in the last 168 hours. No results for input(s): AMMONIA in the last 168 hours.  CBC: Recent Labs  Lab 12/29/19 0627 12/30/19 0641 01/01/20 0806 01/03/20 0900  WBC 18.8* 16.4* 18.5* 19.3*  HGB 7.3* 7.7* 7.7* 7.6*  HCT 23.1* 25.0* 24.0* 24.1*  MCV 87.5 89.6 85.1 86.1  PLT 274 271 310 296    Cardiac Enzymes: No results for input(s): CKTOTAL, CKMB, CKMBINDEX, TROPONINI in the last 168 hours.  BNP: Invalid input(s): POCBNP  CBG: No results for input(s): GLUCAP in the last 168 hours.  Microbiology: Results for orders placed or performed during the hospital encounter of 11/21/19  Culture, Urine     Status: Abnormal   Collection Time: 11/22/19  1:25 PM   Specimen: Urine, Random  Result Value Ref Range Status    Specimen Description URINE, RANDOM  Final   Special Requests   Final    NONE Performed at [facility], [address], [place], [place] [address]    Culture 50,000 COLONIES/mL YEAST (A)  Final   Report Status 11/23/2019 FINAL  Final  Culture, blood (routine x 2)     Status: None   Collection Time: 11/22/19  1:40 PM   Specimen: BLOOD LEFT HAND  Result Value Ref Range Status   Specimen Description BLOOD LEFT HAND  Final   Special Requests   Final    BOTTLES DRAWN AEROBIC ONLY Blood Culture results may not be optimal due to an inadequate volume of blood received in culture bottles   Culture   Final    NO GROWTH 5 DAYS Performed at [facility], [place] [address], [place], [place] [address]    Report Status 11/27/2019 FINAL  Final  Culture, blood (routine x 2)     Status: None   Collection Time: 11/22/19  1:44 PM   Specimen: BLOOD LEFT HAND  Result Value Ref Range Status   Specimen Description BLOOD LEFT HAND  Final   Special Requests   Final    BOTTLES DRAWN AEROBIC ONLY Blood Culture results may not be optimal due to an inadequate volume of blood received in culture bottles   Culture   Final    NO GROWTH 5 DAYS Performed at [facility], [address] [place], [place] [address]    Report Status 11/27/2019 FINAL  Final  Culture, respiratory (non-expectorated)     Status: None   Collection Time: 11/22/19  4:59 PM   Specimen: Tracheal Aspirate; Respiratory  Result Value Ref Range Status   Specimen Description TRACHEAL ASPIRATE  Final   Special Requests NONE  Final   Gram Stain   Final    ABUNDANT WBC PRESENT, PREDOMINANTLY PMN NO ORGANISMS SEEN    Culture   Final    NO GROWTH 2 DAYS Performed at [facility], [address], [place], [place] [address]    Report Status 11/24/2019 FINAL  Final  Culture, respiratory (non-expectorated)     Status: None   Collection Time: 12/04/19  2:22 AM   Specimen: Tracheal Aspirate; Respiratory  Result Value Ref  Range Status   Specimen Description TRACHEAL ASPIRATE  Final   Special Requests NONE  Final   Gram Stain   Final    ABUNDANT WBC PRESENT, PREDOMINANTLY PMN ABUNDANT GRAM NEGATIVE RODS Performed at [facility], [address] [place], [place] [address]    Culture   Final    MODERATE PSEUDOMONAS AERUGINOSA FEW ESCHERICHIA COLI Confirmed Extended Spectrum Beta-Lactamase Producer (ESBL).  In bloodstream infections from ESBL organisms, carbapenems are preferred over piperacillin/tazobactam. They are shown to have a lower risk of mortality.    Report Status 12/09/2019 FINAL  Final   Organism ID, Bacteria PSEUDOMONAS AERUGINOSA  Final   Organism ID, Bacteria ESCHERICHIA COLI  Final      Susceptibility   Escherichia coli - MIC*    AMPICILLIN >=32 RESISTANT Resistant     CEFAZOLIN >=64 RESISTANT Resistant     CEFEPIME 2 SENSITIVE Sensitive     CEFTAZIDIME RESISTANT Resistant     CEFTRIAXONE >=64 RESISTANT Resistant     CIPROFLOXACIN <=0.25 SENSITIVE Sensitive     GENTAMICIN >=16 RESISTANT Resistant     IMIPENEM <=0.25 SENSITIVE Sensitive     TRIMETH/SULFA >=320 RESISTANT Resistant     AMPICILLIN/SULBACTAM 16 INTERMEDIATE Intermediate     PIP/TAZO <=4 SENSITIVE Sensitive     * FEW ESCHERICHIA COLI   Pseudomonas aeruginosa - MIC*    CEFTAZIDIME 16 INTERMEDIATE Intermediate     CIPROFLOXACIN >=4 RESISTANT Resistant     GENTAMICIN 2 SENSITIVE Sensitive     IMIPENEM >=16 RESISTANT Resistant     * MODERATE PSEUDOMONAS AERUGINOSA  Culture, Urine     Status: Abnormal   Collection Time: 12/04/19  6:33 PM   Specimen: Urine, Random  Result Value Ref Range Status   Specimen Description URINE, RANDOM  Final   Special Requests   Final    NONE Performed at [facility], [address], [place], [place] [address]    Culture >=100,000 COLONIES/mL PSEUDOMONAS AERUGINOSA (A)  Final   Report Status 12/06/2019 FINAL  Final   Organism ID, Bacteria PSEUDOMONAS AERUGINOSA (A)  Final       Susceptibility   Pseudomonas aeruginosa - MIC*    CEFTAZIDIME 4 SENSITIVE Sensitive     CIPROFLOXACIN >=4 RESISTANT Resistant     GENTAMICIN 4 SENSITIVE Sensitive     IMIPENEM 2 SENSITIVE Sensitive     PIP/TAZO 16 SENSITIVE Sensitive     * >=100,000 COLONIES/mL PSEUDOMONAS AERUGINOSA  Culture, blood (routine x 2)     Status: Abnormal   Collection Time: 12/11/19  9:39 AM   Specimen: BLOOD LEFT HAND  Result Value Ref Range Status   Specimen Description  BLOOD LEFT HAND  Final   Special Requests   Final    BOTTLES DRAWN AEROBIC AND ANAEROBIC Blood Culture adequate volume   Culture  Setup Time   Final    GRAM POSITIVE COCCI IN CLUSTERS ANAEROBIC BOTTLE ONLY CRITICAL RESULT CALLED TO, READ BACK BY AND VERIFIED WITH: RN R Washta [id number] AT 84 BY CM    Culture (A)  Final    STAPHYLOCOCCUS SPECIES (COAGULASE NEGATIVE) THE SIGNIFICANCE OF ISOLATING THIS ORGANISM FROM A SINGLE SET OF BLOOD CULTURES WHEN MULTIPLE SETS ARE DRAWN IS UNCERTAIN. PLEASE NOTIFY THE MICROBIOLOGY DEPARTMENT WITHIN ONE WEEK IF SPECIATION AND SENSITIVITIES ARE REQUIRED. Performed at [facility], [place] [address] [place], [place] [address]    Report Status 12/14/2019 FINAL  Final  Culture, blood (routine x 2)     Status: None   Collection Time: 12/11/19  9:47 AM   Specimen: BLOOD  Result Value Ref Range Status   Specimen Description BLOOD RIGHT ANTECUBITAL  Final   Special Requests   Final    BOTTLES DRAWN AEROBIC AND ANAEROBIC Blood Culture adequate volume   Culture  Setup Time   Final    CORRECTED RESULTS NO ORGANISMS SEEN PREVIOUSLY REPORTED AS: GRAM POSITIVE COCCI IN CLUSTERS CORRECTED RESULTS CALLED TO: RN R CUMMINGS [id number] AT 920 AM BY CM    Culture   Final    NO GROWTH 5 DAYS Performed at [facility], [place] [address], [place], [place] [address]    Report Status 12/16/2019 FINAL  Final  Culture, respiratory (non-expectorated)     Status: None   Collection Time: 12/11/19 10:27 AM    Specimen: Tracheal Aspirate; Respiratory  Result Value Ref Range Status   Specimen Description TRACHEAL ASPIRATE  Final   Special Requests NONE  Final   Gram Stain NO WBC SEEN NO ORGANISMS SEEN   Final   Culture   Final    FEW PSEUDOMONAS AERUGINOSA FEW STENOTROPHOMONAS MALTOPHILIA SEE SEPARATE REPORT IN [facility] FOR DOS 12/11/19. Performed at [facility], [place] [address] [place], [place] [address]    Report Status 12/26/2019 FINAL  Final   Organism ID, Bacteria PSEUDOMONAS AERUGINOSA  Final   Organism ID, Bacteria STENOTROPHOMONAS MALTOPHILIA  Final      Susceptibility   Pseudomonas aeruginosa - MIC*    CEFTAZIDIME 16 INTERMEDIATE Intermediate     CIPROFLOXACIN >=4 RESISTANT Resistant     GENTAMICIN 2 SENSITIVE Sensitive     IMIPENEM >=16 RESISTANT Resistant     * FEW PSEUDOMONAS AERUGINOSA   Stenotrophomonas maltophilia - MIC*    LEVOFLOXACIN 0.5 SENSITIVE Sensitive     TRIMETH/SULFA <=20 SENSITIVE Sensitive     * FEW STENOTROPHOMONAS MALTOPHILIA  Culture, Urine     Status: Abnormal   Collection Time: 12/11/19 11:30 AM   Specimen: Urine, Random  Result Value Ref Range Status   Specimen Description URINE, RANDOM  Final   Special Requests NONE  Final   Culture (A)  Final    <10,000 COLONIES/mL INSIGNIFICANT GROWTH Performed at [facility], [place] [address] [place],  [address]    Report Status 12/12/2019 FINAL  Final  Culture, blood (routine x 2)     Status: None   Collection Time: 12/15/19 12:37 PM   Specimen: BLOOD LEFT HAND  Result Value Ref Range Status   Specimen Description BLOOD LEFT HAND  Final   Special Requests   Final    BOTTLES DRAWN AEROBIC AND ANAEROBIC Blood Culture adequate  volume   Culture   Final    NO GROWTH 5 DAYS Performed at [facility], [place] [address] [place], [place] [address]    Report Status 12/20/2019 FINAL  Final  Culture, blood (routine x 2)     Status: None   Collection Time: 12/15/19 12:42 PM   Specimen: BLOOD  LEFT HAND  Result Value Ref Range Status   Specimen Description BLOOD LEFT HAND  Final   Special Requests   Final    BOTTLES DRAWN AEROBIC AND ANAEROBIC Blood Culture adequate volume   Culture   Final    NO GROWTH 5 DAYS Performed at [facility], [place] [address], [place], [place] [address]    Report Status 12/20/2019 FINAL  Final  C Difficile Quick Screen w PCR reflex     Status: None   Collection Time: 12/17/19  7:00 PM  Result Value Ref Range Status   C Diff antigen NEGATIVE NEGATIVE Final   C Diff toxin NEGATIVE NEGATIVE Final   C Diff interpretation No C. difficile detected.  Final    Comment: Performed at [facility], [place] [address], [place], [place] [address]  SARS CORONAVIRUS 2 (TAT 6-24 HRS) Nasopharyngeal Nasopharyngeal Swab     Status: None   Collection Time: 12/20/19 11:45 AM   Specimen: Nasopharyngeal Swab  Result Value Ref Range Status   SARS Coronavirus 2 NEGATIVE NEGATIVE Final    Comment: (NOTE) SARS-CoV-2 target nucleic acids are NOT DETECTED. The SARS-CoV-2 RNA is generally detectable in upper and lower respiratory specimens during the acute phase of infection. Negative results do not preclude SARS-CoV-2 infection, do not rule out co-infections with other pathogens, and should not be used as the sole basis for treatment or other patient management decisions. Negative results must be combined with clinical observations, patient history, and epidemiological information. The expected result is Negative. Fact Sheet for Patients: SugarRoll.be Fact Sheet for Healthcare Providers: https://www.woods-mathews.com/ This test is not yet approved or cleared by the [place] FDA and  has been authorized for detection and/or diagnosis of SARS-CoV-2 by FDA under an Emergency Use Authorization (EUA). This EUA will remain  in effect (meaning this test can be used) for the duration of the COVID-19 declaration under  Section 56 4(b)(1) of the Act, 21 U.S.C. section 360bbb-3(b)(1), unless the authorization is terminated or revoked sooner. Performed at [facility], [place] [address], [place], [place] [address]   Body fluid culture     Status: None   Collection Time: 12/21/19 12:10 PM   Specimen: Lung, Left; Pleural Fluid  Result Value Ref Range Status   Specimen Description PLEURAL FLUID  Final   Special Requests LEFT LUNG THORA  Final   Gram Stain   Final    RARE WBC PRESENT, PREDOMINANTLY MONONUCLEAR NO ORGANISMS SEEN    Culture   Final    NO GROWTH 3 DAYS Performed at [facility], [place] [address], [place], [place] [address]    Report Status 12/24/2019 FINAL  Final  Culture, blood (routine x 2)     Status: None   Collection Time: 12/25/19  2:16 PM   Specimen: BLOOD RIGHT HAND  Result Value Ref Range Status   Specimen Description BLOOD RIGHT HAND  Final   Special Requests   Final    BOTTLES DRAWN AEROBIC ONLY Blood Culture adequate volume   Culture   Final    NO GROWTH 5 DAYS Performed at [facility],  [address], [place], [place] [address]    Report Status 01/01/2020 FINAL  Final  Culture, blood (routine x 2)     Status: None   Collection Time: 12/25/19  2:18 PM   Specimen: BLOOD LEFT HAND  Result Value Ref Range Status   Specimen Description BLOOD LEFT HAND  Final   Special Requests   Final    BOTTLES DRAWN AEROBIC ONLY Blood Culture adequate volume   Culture   Final    NO GROWTH 5 DAYS Performed at [facility], [place] [address] [place], [place] [address]    Report Status 01/01/2020 FINAL  Final  Culture, respiratory (non-expectorated)     Status: None   Collection Time: 12/25/19  4:20 PM   Specimen: Tracheal Aspirate; Respiratory  Result Value Ref Range Status   Specimen Description TRACHEAL ASPIRATE  Final   Special Requests NONE  Final   Gram Stain   Final    ABUNDANT WBC PRESENT, PREDOMINANTLY PMN NO ORGANISMS SEEN    Culture   Final    RARE  Consistent with normal respiratory flora. Performed at [facility], [place] [address] [place], [place] [address]    Report Status 12/28/2019 FINAL  Final    Coagulation Studies: No results for input(s): LABPROT, INR in the last 72 hours.  Urinalysis: No results for input(s): COLORURINE, LABSPEC, PHURINE, GLUCOSEU, HGBUR, BILIRUBINUR, KETONESUR, PROTEINUR, UROBILINOGEN, NITRITE, LEUKOCYTESUR in the last 72 hours.  Invalid input(s): APPERANCEUR    Imaging: No results found.   Medications:     heparin, lidocaine, lidocaine  Assessment/ Plan:  74 y.o. male with a PMHx of acute respiratory failure status post COVID-19 infection status post tracheostomy placement, atrial fibrillation, hypertension, CVA, gout, hypertrophic cardiomyopathy, who was admitted to [facility] on 11/21/2019 for ongoing treatment of acute respiratory failure.  1.  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  Patient continues to have significant volume overload.  Increase ultrafiltration target of 2 to 2.5 kg for the dialysis treatment.  2.  Acute respiratory failure secondary to COVID-19 infection and its sequela.  -Patient continues to have tachypnea on the vent.  We will increase ultrafiltration targets..  3.  Hyperkalemia.  Potassium now normal at 3.6.  Continue to monitor.  4.  Hyponatremia.  Sodium now normal at 135.     LOS: 0 Megen Madewell 4/7/202110:54 AM

## 2020-01-04 ENCOUNTER — Other Ambulatory Visit (HOSPITAL_COMMUNITY): Payer: Medicare HMO

## 2020-01-04 DIAGNOSIS — N179 Acute kidney failure, unspecified: Secondary | ICD-10-CM | POA: Diagnosis not present

## 2020-01-04 DIAGNOSIS — I4891 Unspecified atrial fibrillation: Secondary | ICD-10-CM | POA: Diagnosis not present

## 2020-01-04 DIAGNOSIS — J9621 Acute and chronic respiratory failure with hypoxia: Secondary | ICD-10-CM | POA: Diagnosis not present

## 2020-01-04 DIAGNOSIS — U071 COVID-19: Secondary | ICD-10-CM | POA: Diagnosis not present

## 2020-01-04 NOTE — Progress Notes (Signed)
Pulmonary Critical Care Medicine [place]   PULMONARY CRITICAL CARE SERVICE  PROGRESS NOTE  Date of Service: 01/04/2020  Shane Banks  LEX:[id number]  DOB: 11/15/1945   DOA: 11/21/2019  Referring Physician: [name], MD  HPI: Shane Banks is a 74 y.o. male seen for follow up of Acute on Chronic Respiratory Failure.  Patient currently is on pressure support has been on 40% FiO2 with good volumes.  Medications: Reviewed on Rounds  Physical Exam:  Vitals: Temperature is 98.2 pulse 67 respiratory rate 20 blood pressure is 122/60 saturations 99%  Ventilator Settings mode ventilation pressure support FiO2 40% tidal volume 382 pressure poor 12 PEEP 7  . General: Comfortable at this time . Eyes: Grossly normal lids, irises & conjunctiva . ENT: grossly tongue is normal . Neck: no obvious mass . Cardiovascular: S1 S2 normal no gallop . Respiratory: Scattered rhonchi expansion is equal . Abdomen: soft . Skin: no rash seen on limited exam . Musculoskeletal: not rigid . Psychiatric:unable to assess . Neurologic: no seizure no involuntary movements         Lab Data:   Basic Metabolic Panel: Recent Labs  Lab 12/29/19 0627 01/01/20 0811 01/03/20 0900  NA 132* 134* 135  K 4.1 3.9 3.6  CL 95* 97* 96*  CO2 25 27 29   GLUCOSE 151* 132* 116*  BUN 76* 110* 77*  CREATININE 2.25* 2.67* 2.15*  CALCIUM 8.5* 9.1 8.8*  MG 2.0  --   --   PHOS 3.9 3.6 3.2    ABG: No results for input(s): PHART, PCO2ART, PO2ART, HCO3, O2SAT in the last 168 hours.  Liver Function Tests: Recent Labs  Lab 12/29/19 0627 01/01/20 0811 01/03/20 0900  ALBUMIN 1.8* 1.9* 1.9*   No results for input(s): LIPASE, AMYLASE in the last 168 hours. No results for input(s): AMMONIA in the last 168 hours.  CBC: Recent Labs  Lab 12/29/19 0627 12/30/19 0641 01/01/20 0806 01/03/20 0900  WBC 18.8* 16.4* 18.5* 19.3*  HGB 7.3* 7.7* 7.7* 7.6*  HCT 23.1* 25.0* 24.0* 24.1*  MCV 87.5 89.6  85.1 86.1  PLT 274 271 310 296    Cardiac Enzymes: No results for input(s): CKTOTAL, CKMB, CKMBINDEX, TROPONINI in the last 168 hours.  BNP (last 3 results) No results for input(s): BNP in the last 8760 hours.  ProBNP (last 3 results) No results for input(s): PROBNP in the last 8760 hours.  Radiological Exams: DG CHEST PORT 1 VIEW  Result Date: 01/04/2020 CLINICAL DATA:  Pneumonia. EXAM: PORTABLE CHEST 1 VIEW COMPARISON:  12/29/2019 FINDINGS: The tracheostomy tube is stable. The right IJ central venous catheter is stable. Persistent diffuse interstitial and airspace process in the lungs with more focal left lower lobe airspace consolidation. Bilateral pleural effusions. No pneumothorax. IMPRESSION: 1. Persistent diffuse interstitial and airspace process in the lungs. Stable small bilateral pleural effusions. 2. Stable support apparatus. Electronically Signed   By: Marijo Sanes M.D.   On: 01/04/2020 05:54    Assessment/Plan Active Problems:   Acute on chronic respiratory failure with hypoxia (HCC)   COVID-19 virus infection   Atrial fibrillation with RVR (HCC)   Severe sepsis (HCC)   Pneumonia due to COVID-19 virus   1. Acute on chronic respiratory failure with hypoxia patient is on pressure support on 40% FiO2 good saturations good oxygenation noted 2. COVID-19 virus infection in resolution phase we will continue with supportive care 3. Atrial fibrillation rate controlled 4. Severe sepsis resolved hemodynamics are stable 5. Pneumonia due to COVID-19  in resolution phase patient has still persistent infiltrates noted as sequela of lung damage.  We will continue to monitor   I have personally seen and evaluated the patient, evaluated laboratory and imaging results, formulated the assessment and plan and placed orders. The Patient requires high complexity decision making with multiple systems involvement.  Rounds were done with the Respiratory Therapy Director and Staff therapists  and discussed with nursing staff also.  Allyne Gee, MD [facility] Pulmonary Critical Care Medicine Sleep Medicine

## 2020-01-05 DIAGNOSIS — U071 COVID-19: Secondary | ICD-10-CM | POA: Diagnosis not present

## 2020-01-05 DIAGNOSIS — J9621 Acute and chronic respiratory failure with hypoxia: Secondary | ICD-10-CM | POA: Diagnosis not present

## 2020-01-05 DIAGNOSIS — I4891 Unspecified atrial fibrillation: Secondary | ICD-10-CM | POA: Diagnosis not present

## 2020-01-05 DIAGNOSIS — N179 Acute kidney failure, unspecified: Secondary | ICD-10-CM | POA: Diagnosis not present

## 2020-01-05 LAB — CBC
HCT: 25 % — ABNORMAL LOW (ref 39.0–52.0)
Hemoglobin: 7.8 g/dL — ABNORMAL LOW (ref 13.0–17.0)
MCH: 27.2 pg (ref 26.0–34.0)
MCHC: 31.2 g/dL (ref 30.0–36.0)
MCV: 87.1 fL (ref 80.0–100.0)
Platelets: 292 10*3/uL (ref 150–400)
RBC: 2.87 MIL/uL — ABNORMAL LOW (ref 4.22–5.81)
RDW: 19.2 % — ABNORMAL HIGH (ref 11.5–15.5)
WBC: 15.7 10*3/uL — ABNORMAL HIGH (ref 4.0–10.5)
nRBC: 0 % (ref 0.0–0.2)

## 2020-01-05 LAB — RENAL FUNCTION PANEL
Albumin: 1.9 g/dL — ABNORMAL LOW (ref 3.5–5.0)
Anion gap: 10 (ref 5–15)
BUN: 78 mg/dL — ABNORMAL HIGH (ref 8–23)
CO2: 28 mmol/L (ref 22–32)
Calcium: 9 mg/dL (ref 8.9–10.3)
Chloride: 96 mmol/L — ABNORMAL LOW (ref 98–111)
Creatinine, Ser: 2.07 mg/dL — ABNORMAL HIGH (ref 0.61–1.24)
GFR calc Af Amer: 36 mL/min — ABNORMAL LOW (ref >60)
GFR calc non Af Amer: 31 mL/min — ABNORMAL LOW (ref >60)
Glucose, Bld: 142 mg/dL — ABNORMAL HIGH (ref 70–99)
Phosphorus: 2.7 mg/dL (ref 2.5–4.6)
Potassium: 3.8 mmol/L (ref 3.5–5.1)
Sodium: 134 mmol/L — ABNORMAL LOW (ref 135–145)

## 2020-01-05 NOTE — Progress Notes (Signed)
Let me know [facility]  ROUNDING NOTE   Subjective:  Patient seen and evaluated at bedside. Critical illness persist. Due for dialysis treatment today.  Objective:  Vital signs in last 24 hours:  Temperature 97.3 pulse 66 respirations 46 blood pressure 149/71  Physical Exam: General: Critically ill-appearing  Head: Normocephalic, atraumatic. Moist oral mucosal membranes  Eyes: Anicteric  Neck: Tracheostomy in place  Lungs:  Scattered rhonchi, vent assisted, tachypneic  Heart: S1S2 no rubs  Abdomen:  Soft, nontender, bowel sounds present, PEG tube in place  Extremities: 3+ peripheral edema.  Neurologic: Not following commands  Skin: No rash  Access: Right IJ temporary dialysis catheter    Basic Metabolic Panel: Recent Labs  Lab 01/01/20 0811 01/03/20 0900 01/05/20 0509  NA 134* 135 134*  K 3.9 3.6 3.8  CL 97* 96* 96*  CO2 27 29 28   GLUCOSE 132* 116* 142*  BUN 110* 77* 78*  CREATININE 2.67* 2.15* 2.07*  CALCIUM 9.1 8.8* 9.0  PHOS 3.6 3.2 2.7    Liver Function Tests: Recent Labs  Lab 01/01/20 0811 01/03/20 0900 01/05/20 0509  ALBUMIN 1.9* 1.9* 1.9*   No results for input(s): LIPASE, AMYLASE in the last 168 hours. No results for input(s): AMMONIA in the last 168 hours.  CBC: Recent Labs  Lab 12/30/19 0641 01/01/20 0806 01/03/20 0900 01/05/20 0509  WBC 16.4* 18.5* 19.3* 15.7*  HGB 7.7* 7.7* 7.6* 7.8*  HCT 25.0* 24.0* 24.1* 25.0*  MCV 89.6 85.1 86.1 87.1  PLT 271 310 296 292    Cardiac Enzymes: No results for input(s): CKTOTAL, CKMB, CKMBINDEX, TROPONINI in the last 168 hours.  BNP: Invalid input(s): POCBNP  CBG: No results for input(s): GLUCAP in the last 168 hours.  Microbiology: Results for orders placed or performed during the hospital encounter of 11/21/19  Culture, Urine     Status: Abnormal   Collection Time: 11/22/19  1:25 PM   Specimen: Urine, Random  Result Value Ref Range Status   Specimen Description URINE, RANDOM   Final   Special Requests   Final    NONE Performed at [facility], [address] [place], [place] [address]    Culture 50,000 COLONIES/mL YEAST (A)  Final   Report Status 11/23/2019 FINAL  Final  Culture, blood (routine x 2)     Status: None   Collection Time: 11/22/19  1:40 PM   Specimen: BLOOD LEFT HAND  Result Value Ref Range Status   Specimen Description BLOOD LEFT HAND  Final   Special Requests   Final    BOTTLES DRAWN AEROBIC ONLY Blood Culture results may not be optimal due to an inadequate volume of blood received in culture bottles   Culture   Final    NO GROWTH 5 DAYS Performed at [facility], [place] [address] [place], [place] [address]    Report Status 11/27/2019 FINAL  Final  Culture, blood (routine x 2)     Status: None   Collection Time: 11/22/19  1:44 PM   Specimen: BLOOD LEFT HAND  Result Value Ref Range Status   Specimen Description BLOOD LEFT HAND  Final   Special Requests   Final    BOTTLES DRAWN AEROBIC ONLY Blood Culture results may not be optimal due to an inadequate volume of blood received in culture bottles   Culture   Final    NO GROWTH 5 DAYS Performed at [facility], [place] [address] [place], [place] [address]  Report Status 11/27/2019 FINAL  Final  Culture, respiratory (non-expectorated)     Status: None   Collection Time: 11/22/19  4:59 PM   Specimen: Tracheal Aspirate; Respiratory  Result Value Ref Range Status   Specimen Description TRACHEAL ASPIRATE  Final   Special Requests NONE  Final   Gram Stain   Final    ABUNDANT WBC PRESENT, PREDOMINANTLY PMN NO ORGANISMS SEEN    Culture   Final    NO GROWTH 2 DAYS Performed at [facility], [address] [place], [place] [address]    Report Status 11/24/2019 FINAL  Final  Culture, respiratory (non-expectorated)     Status: None   Collection Time: 12/04/19  2:22 AM   Specimen: Tracheal Aspirate; Respiratory  Result Value Ref Range Status   Specimen Description  TRACHEAL ASPIRATE  Final   Special Requests NONE  Final   Gram Stain   Final    ABUNDANT WBC PRESENT, PREDOMINANTLY PMN ABUNDANT GRAM NEGATIVE RODS Performed at [facility], [address], [place], [place] [address]    Culture   Final    MODERATE PSEUDOMONAS AERUGINOSA FEW ESCHERICHIA COLI Confirmed Extended Spectrum Beta-Lactamase Producer (ESBL).  In bloodstream infections from ESBL organisms, carbapenems are preferred over piperacillin/tazobactam. They are shown to have a lower risk of mortality.    Report Status 12/09/2019 FINAL  Final   Organism ID, Bacteria PSEUDOMONAS AERUGINOSA  Final   Organism ID, Bacteria ESCHERICHIA COLI  Final      Susceptibility   Escherichia coli - MIC*    AMPICILLIN >=32 RESISTANT Resistant     CEFAZOLIN >=64 RESISTANT Resistant     CEFEPIME 2 SENSITIVE Sensitive     CEFTAZIDIME RESISTANT Resistant     CEFTRIAXONE >=64 RESISTANT Resistant     CIPROFLOXACIN <=0.25 SENSITIVE Sensitive     GENTAMICIN >=16 RESISTANT Resistant     IMIPENEM <=0.25 SENSITIVE Sensitive     TRIMETH/SULFA >=320 RESISTANT Resistant     AMPICILLIN/SULBACTAM 16 INTERMEDIATE Intermediate     PIP/TAZO <=4 SENSITIVE Sensitive     * FEW ESCHERICHIA COLI   Pseudomonas aeruginosa - MIC*    CEFTAZIDIME 16 INTERMEDIATE Intermediate     CIPROFLOXACIN >=4 RESISTANT Resistant     GENTAMICIN 2 SENSITIVE Sensitive     IMIPENEM >=16 RESISTANT Resistant     * MODERATE PSEUDOMONAS AERUGINOSA  Culture, Urine     Status: Abnormal   Collection Time: 12/04/19  6:33 PM   Specimen: Urine, Random  Result Value Ref Range Status   Specimen Description URINE, RANDOM  Final   Special Requests   Final    NONE Performed at [facility], [address], [place], [place] [address]    Culture >=100,000 COLONIES/mL PSEUDOMONAS AERUGINOSA (A)  Final   Report Status 12/06/2019 FINAL  Final   Organism ID, Bacteria PSEUDOMONAS AERUGINOSA (A)  Final      Susceptibility   Pseudomonas  aeruginosa - MIC*    CEFTAZIDIME 4 SENSITIVE Sensitive     CIPROFLOXACIN >=4 RESISTANT Resistant     GENTAMICIN 4 SENSITIVE Sensitive     IMIPENEM 2 SENSITIVE Sensitive     PIP/TAZO 16 SENSITIVE Sensitive     * >=100,000 COLONIES/mL PSEUDOMONAS AERUGINOSA  Culture, blood (routine x 2)     Status: Abnormal   Collection Time: 12/11/19  9:39 AM   Specimen: BLOOD LEFT HAND  Result Value Ref Range Status   Specimen Description BLOOD LEFT HAND  Final   Special Requests  Final    BOTTLES DRAWN AEROBIC AND ANAEROBIC Blood Culture adequate volume   Culture  Setup Time   Final    GRAM POSITIVE COCCI IN CLUSTERS ANAEROBIC BOTTLE ONLY CRITICAL RESULT CALLED TO, READ BACK BY AND VERIFIED WITH: RN R [place] [id number] AT 7 BY CM    Culture (A)  Final    STAPHYLOCOCCUS SPECIES (COAGULASE NEGATIVE) THE SIGNIFICANCE OF ISOLATING THIS ORGANISM FROM A SINGLE SET OF BLOOD CULTURES WHEN MULTIPLE SETS ARE DRAWN IS UNCERTAIN. PLEASE NOTIFY THE MICROBIOLOGY DEPARTMENT WITHIN ONE WEEK IF SPECIATION AND SENSITIVITIES ARE REQUIRED. Performed at [facility], [place] [address] [place], [place] [address]    Report Status 12/14/2019 FINAL  Final  Culture, blood (routine x 2)     Status: None   Collection Time: 12/11/19  9:47 AM   Specimen: BLOOD  Result Value Ref Range Status   Specimen Description BLOOD RIGHT ANTECUBITAL  Final   Special Requests   Final    BOTTLES DRAWN AEROBIC AND ANAEROBIC Blood Culture adequate volume   Culture  Setup Time   Final    CORRECTED RESULTS NO ORGANISMS SEEN PREVIOUSLY REPORTED AS: GRAM POSITIVE COCCI IN CLUSTERS CORRECTED RESULTS CALLED TO: RN R [place] [id number] AT 920 AM BY CM    Culture   Final    NO GROWTH 5 DAYS Performed at [facility], [place] [address], [place], [place] [address]    Report Status 12/16/2019 FINAL  Final  Culture, respiratory (non-expectorated)     Status: None   Collection Time: 12/11/19 10:27 AM   Specimen: Tracheal Aspirate;  Respiratory  Result Value Ref Range Status   Specimen Description TRACHEAL ASPIRATE  Final   Special Requests NONE  Final   Gram Stain NO WBC SEEN NO ORGANISMS SEEN   Final   Culture   Final    FEW PSEUDOMONAS AERUGINOSA FEW STENOTROPHOMONAS MALTOPHILIA SEE SEPARATE REPORT IN [facility] FOR DOS 12/11/19. Performed at [facility], [place] [address], [place], [place] [address]    Report Status 12/26/2019 FINAL  Final   Organism ID, Bacteria PSEUDOMONAS AERUGINOSA  Final   Organism ID, Bacteria STENOTROPHOMONAS MALTOPHILIA  Final      Susceptibility   Pseudomonas aeruginosa - MIC*    CEFTAZIDIME 16 INTERMEDIATE Intermediate     CIPROFLOXACIN >=4 RESISTANT Resistant     GENTAMICIN 2 SENSITIVE Sensitive     IMIPENEM >=16 RESISTANT Resistant     * FEW PSEUDOMONAS AERUGINOSA   Stenotrophomonas maltophilia - MIC*    LEVOFLOXACIN 0.5 SENSITIVE Sensitive     TRIMETH/SULFA <=20 SENSITIVE Sensitive     * FEW STENOTROPHOMONAS MALTOPHILIA  Culture, Urine     Status: Abnormal   Collection Time: 12/11/19 11:30 AM   Specimen: Urine, Random  Result Value Ref Range Status   Specimen Description URINE, RANDOM  Final   Special Requests NONE  Final   Culture (A)  Final    <10,000 COLONIES/mL INSIGNIFICANT GROWTH Performed at [facility], [place] [address] [place], [place] [address]    Report Status 12/12/2019 FINAL  Final  Culture, blood (routine x 2)     Status: None   Collection Time: 12/15/19 12:37 PM   Specimen: BLOOD LEFT HAND  Result Value Ref Range Status   Specimen Description BLOOD LEFT HAND  Final   Special Requests   Final    BOTTLES DRAWN AEROBIC AND ANAEROBIC Blood Culture adequate volume   Culture   Final    NO  GROWTH 5 DAYS Performed at [facility], [place] [address], [place], [place] [address]    Report Status 12/20/2019 FINAL  Final  Culture, blood (routine x 2)     Status: None   Collection Time: 12/15/19 12:42 PM   Specimen: BLOOD LEFT HAND  Result Value Ref  Range Status   Specimen Description BLOOD LEFT HAND  Final   Special Requests   Final    BOTTLES DRAWN AEROBIC AND ANAEROBIC Blood Culture adequate volume   Culture   Final    NO GROWTH 5 DAYS Performed at [facility], [place] [address] [place], [place] [address]    Report Status 12/20/2019 FINAL  Final  C Difficile Quick Screen w PCR reflex     Status: None   Collection Time: 12/17/19  7:00 PM  Result Value Ref Range Status   C Diff antigen NEGATIVE NEGATIVE Final   C Diff toxin NEGATIVE NEGATIVE Final   C Diff interpretation No C. difficile detected.  Final    Comment: Performed at [facility], [place] [address] [place], [place] [address]  SARS CORONAVIRUS 2 (TAT 6-24 HRS) Nasopharyngeal Nasopharyngeal Swab     Status: None   Collection Time: 12/20/19 11:45 AM   Specimen: Nasopharyngeal Swab  Result Value Ref Range Status   SARS Coronavirus 2 NEGATIVE NEGATIVE Final    Comment: (NOTE) SARS-CoV-2 target nucleic acids are NOT DETECTED. The SARS-CoV-2 RNA is generally detectable in upper and lower respiratory specimens during the acute phase of infection. Negative results do not preclude SARS-CoV-2 infection, do not rule out co-infections with other pathogens, and should not be used as the sole basis for treatment or other patient management decisions. Negative results must be combined with clinical observations, patient history, and epidemiological information. The expected result is Negative. Fact Sheet for Patients: SugarRoll.be Fact Sheet for Healthcare Providers: https://www.woods-mathews.com/ This test is not yet approved or cleared by the [place] FDA and  has been authorized for detection and/or diagnosis of SARS-CoV-2 by FDA under an Emergency Use Authorization (EUA). This EUA will remain  in effect (meaning this test can be used) for the duration of the COVID-19 declaration under Section 56 4(b)(1) of the Act, 21  U.S.C. section 360bbb-3(b)(1), unless the authorization is terminated or revoked sooner. Performed at [facility], [place] [address] [place], [place] [address]   Body fluid culture     Status: None   Collection Time: 12/21/19 12:10 PM   Specimen: Lung, Left; Pleural Fluid  Result Value Ref Range Status   Specimen Description PLEURAL FLUID  Final   Special Requests LEFT LUNG THORA  Final   Gram Stain   Final    RARE WBC PRESENT, PREDOMINANTLY MONONUCLEAR NO ORGANISMS SEEN    Culture   Final    NO GROWTH 3 DAYS Performed at [facility], [place] [address] [place],  [address]    Report Status 12/24/2019 FINAL  Final  Culture, blood (routine x 2)     Status: None   Collection Time: 12/25/19  2:16 PM   Specimen: BLOOD RIGHT HAND  Result Value Ref Range Status   Specimen Description BLOOD RIGHT HAND  Final   Special Requests   Final    BOTTLES DRAWN AEROBIC ONLY Blood Culture adequate volume   Culture   Final    NO GROWTH 5 DAYS Performed at [facility], [place] [address], [place],  [address]    Report  Status 01/01/2020 FINAL  Final  Culture, blood (routine x 2)     Status: None   Collection Time: 12/25/19  2:18 PM   Specimen: BLOOD LEFT HAND  Result Value Ref Range Status   Specimen Description BLOOD LEFT HAND  Final   Special Requests   Final    BOTTLES DRAWN AEROBIC ONLY Blood Culture adequate volume   Culture   Final    NO GROWTH 5 DAYS Performed at [facility], [place] [address] [place], [place] [address]    Report Status 01/01/2020 FINAL  Final  Culture, respiratory (non-expectorated)     Status: None   Collection Time: 12/25/19  4:20 PM   Specimen: Tracheal Aspirate; Respiratory  Result Value Ref Range Status   Specimen Description TRACHEAL ASPIRATE  Final   Special Requests NONE  Final   Gram Stain   Final    ABUNDANT WBC PRESENT, PREDOMINANTLY PMN NO ORGANISMS SEEN    Culture   Final    RARE Consistent with normal respiratory  flora. Performed at [facility], [place] [address], [place], [place] [address]    Report Status 12/28/2019 FINAL  Final    Coagulation Studies: No results for input(s): LABPROT, INR in the last 72 hours.  Urinalysis: No results for input(s): COLORURINE, LABSPEC, PHURINE, GLUCOSEU, HGBUR, BILIRUBINUR, KETONESUR, PROTEINUR, UROBILINOGEN, NITRITE, LEUKOCYTESUR in the last 72 hours.  Invalid input(s): APPERANCEUR    Imaging: DG CHEST PORT 1 VIEW  Result Date: 01/04/2020 CLINICAL DATA:  Pneumonia. EXAM: PORTABLE CHEST 1 VIEW COMPARISON:  12/29/2019 FINDINGS: The tracheostomy tube is stable. The right IJ central venous catheter is stable. Persistent diffuse interstitial and airspace process in the lungs with more focal left lower lobe airspace consolidation. Bilateral pleural effusions. No pneumothorax. IMPRESSION: 1. Persistent diffuse interstitial and airspace process in the lungs. Stable small bilateral pleural effusions. 2. Stable support apparatus. Electronically Signed   By: Marijo Sanes M.D.   On: 01/04/2020 05:54     Medications:     heparin, lidocaine, lidocaine  Assessment/ Plan:  74 y.o. male with a PMHx of acute respiratory failure status post COVID-19 infection status post tracheostomy placement, atrial fibrillation, hypertension, CVA, gout, hypertrophic cardiomyopathy, who was admitted to [facility] on 11/21/2019 for ongoing treatment of acute respiratory failure.  1.  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  Patient seen and evaluated at bedside.  Remains dialysis dependent.  We will plan for hemodialysis treatment today.  Ultrafiltration target 2 to 2.5 kg.  2.  Acute respiratory failure secondary to COVID-19 infection and its sequela.  -Patient continues to have significant tachypnea.  Respiratory rate 46.  Pulmonary/critical care following.  3.  Hyperkalemia.  Resolved with dialysis treatments.  4.  Hyponatremia.  Serum sodium  slightly low at 134.  5.  Overall prognosis remains quite guarded.     LOS: 0 Nikkol Pai 4/9/20218:19 AM

## 2020-01-05 NOTE — Progress Notes (Signed)
Pulmonary Critical Care Medicine [place]   PULMONARY CRITICAL CARE SERVICE  PROGRESS NOTE  Date of Service: 01/05/2020  Shane Banks  SWN:[id number]  DOB: [birth year]   DOA: 11/21/2019  Referring Physician: [name], MD  HPI: Shane Banks is a 74 y.o. male seen for follow up of Acute on Chronic Respiratory Failure.  Patient at this time is on pressure support has been on 40% FiO2 the goal today is for 20 hours  Medications: Reviewed on Rounds  Physical Exam:  Vitals: Temperature is 97.3 pulse 66 respiratory 35 blood pressure is 149/71 saturations 96%  Ventilator Settings on pressure support FiO2 40% pressure poor 12 PEEP 5  . General: Comfortable at this time . Eyes: Grossly normal lids, irises & conjunctiva . ENT: grossly tongue is normal . Neck: no obvious mass . Cardiovascular: S1 S2 normal no gallop . Respiratory: No rhonchi coarse breath sounds are noted . Abdomen: soft . Skin: no rash seen on limited exam . Musculoskeletal: not rigid . Psychiatric:unable to assess . Neurologic: no seizure no involuntary movements         Lab Data:   Basic Metabolic Panel: Recent Labs  Lab 01/01/20 0811 01/03/20 0900 01/05/20 0509  NA 134* 135 134*  K 3.9 3.6 3.8  CL 97* 96* 96*  CO2 27 29 28   GLUCOSE 132* 116* 142*  BUN 110* 77* 78*  CREATININE 2.67* 2.15* 2.07*  CALCIUM 9.1 8.8* 9.0  PHOS 3.6 3.2 2.7    ABG: No results for input(s): PHART, PCO2ART, PO2ART, HCO3, O2SAT in the last 168 hours.  Liver Function Tests: Recent Labs  Lab 01/01/20 0811 01/03/20 0900 01/05/20 0509  ALBUMIN 1.9* 1.9* 1.9*   No results for input(s): LIPASE, AMYLASE in the last 168 hours. No results for input(s): AMMONIA in the last 168 hours.  CBC: Recent Labs  Lab 12/30/19 0641 01/01/20 0806 01/03/20 0900 01/05/20 0509  WBC 16.4* 18.5* 19.3* 15.7*  HGB 7.7* 7.7* 7.6* 7.8*  HCT 25.0* 24.0* 24.1* 25.0*  MCV 89.6 85.1 86.1 87.1  PLT 271 310 296 292     Cardiac Enzymes: No results for input(s): CKTOTAL, CKMB, CKMBINDEX, TROPONINI in the last 168 hours.  BNP (last 3 results) No results for input(s): BNP in the last 8760 hours.  ProBNP (last 3 results) No results for input(s): PROBNP in the last 8760 hours.  Radiological Exams: DG CHEST PORT 1 VIEW  Result Date: 01/04/2020 CLINICAL DATA:  Pneumonia. EXAM: PORTABLE CHEST 1 VIEW COMPARISON:  12/29/2019 FINDINGS: The tracheostomy tube is stable. The right IJ central venous catheter is stable. Persistent diffuse interstitial and airspace process in the lungs with more focal left lower lobe airspace consolidation. Bilateral pleural effusions. No pneumothorax. IMPRESSION: 1. Persistent diffuse interstitial and airspace process in the lungs. Stable small bilateral pleural effusions. 2. Stable support apparatus. Electronically Signed   By: Marijo Sanes M.D.   On: 01/04/2020 05:54    Assessment/Plan Active Problems:   Acute on chronic respiratory failure with hypoxia (HCC)   COVID-19 virus infection   Atrial fibrillation with RVR (HCC)   Severe sepsis (HCC)   Pneumonia due to COVID-19 virus   1. Acute on chronic respiratory failure with hypoxia patient will be continued on pressure support goal 20 hours 2. COVID-19 virus infection at baseline we will continue with supportive care 3. Atrial fibrillation RVR treated rate is well controlled we will continue to monitor. 4. Severe sepsis hemodynamics are stable 5. Pneumonia due to COVID-19 we will  continue with supportive care   I have personally seen and evaluated the patient, evaluated laboratory and imaging results, formulated the assessment and plan and placed orders. The Patient requires high complexity decision making with multiple systems involvement.  Rounds were done with the Respiratory Therapy Director and Staff therapists and discussed with nursing staff also.  Allyne Gee, MD [facility] Pulmonary Critical Care Medicine Sleep  Medicine

## 2020-01-06 DIAGNOSIS — N179 Acute kidney failure, unspecified: Secondary | ICD-10-CM | POA: Diagnosis not present

## 2020-01-06 DIAGNOSIS — I4891 Unspecified atrial fibrillation: Secondary | ICD-10-CM | POA: Diagnosis not present

## 2020-01-06 DIAGNOSIS — U071 COVID-19: Secondary | ICD-10-CM | POA: Diagnosis not present

## 2020-01-06 DIAGNOSIS — J9621 Acute and chronic respiratory failure with hypoxia: Secondary | ICD-10-CM | POA: Diagnosis not present

## 2020-01-06 NOTE — Progress Notes (Signed)
Pulmonary Critical Care Medicine [place]   PULMONARY CRITICAL CARE SERVICE  PROGRESS NOTE  Date of Service: 01/06/2020  Shane Banks  FEO:[id number]  DOB: [birth year]   DOA: 11/21/2019  Referring Physician: [name], MD  HPI: Shane Banks is a 74 y.o. male seen for follow up of Acute on Chronic Respiratory Failure.  Patient currently is on pressure support has been on 40% FiO2 with a goal of 16 hours  Medications: Reviewed on Rounds  Physical Exam:  Vitals: Temperature is 98.0 pulse 66 respiratory rate 22 blood pressure is 165/86 saturations 99%  Ventilator Settings on pressure support FiO2 40% 12/5  . General: Comfortable at this time . Eyes: Grossly normal lids, irises & conjunctiva . ENT: grossly tongue is normal . Neck: no obvious mass . Cardiovascular: S1 S2 normal no gallop . Respiratory: No rhonchi coarse breath sounds . Abdomen: soft . Skin: no rash seen on limited exam . Musculoskeletal: not rigid . Psychiatric:unable to assess . Neurologic: no seizure no involuntary movements         Lab Data:   Basic Metabolic Panel: Recent Labs  Lab 01/01/20 0811 01/03/20 0900 01/05/20 0509  NA 134* 135 134*  K 3.9 3.6 3.8  CL 97* 96* 96*  CO2 27 29 28   GLUCOSE 132* 116* 142*  BUN 110* 77* 78*  CREATININE 2.67* 2.15* 2.07*  CALCIUM 9.1 8.8* 9.0  PHOS 3.6 3.2 2.7    ABG: No results for input(s): PHART, PCO2ART, PO2ART, HCO3, O2SAT in the last 168 hours.  Liver Function Tests: Recent Labs  Lab 01/01/20 0811 01/03/20 0900 01/05/20 0509  ALBUMIN 1.9* 1.9* 1.9*   No results for input(s): LIPASE, AMYLASE in the last 168 hours. No results for input(s): AMMONIA in the last 168 hours.  CBC: Recent Labs  Lab 01/01/20 0806 01/03/20 0900 01/05/20 0509  WBC 18.5* 19.3* 15.7*  HGB 7.7* 7.6* 7.8*  HCT 24.0* 24.1* 25.0*  MCV 85.1 86.1 87.1  PLT 310 296 292    Cardiac Enzymes: No results for input(s): CKTOTAL, CKMB, CKMBINDEX,  TROPONINI in the last 168 hours.  BNP (last 3 results) No results for input(s): BNP in the last 8760 hours.  ProBNP (last 3 results) No results for input(s): PROBNP in the last 8760 hours.  Radiological Exams: No results found.  Assessment/Plan Active Problems:   Acute on chronic respiratory failure with hypoxia (HCC)   COVID-19 virus infection   Atrial fibrillation with RVR (HCC)   Severe sepsis (HCC)   Pneumonia due to COVID-19 virus   1. Acute on chronic respiratory failure hypoxia continue with pressure support FiO2 40% plan is to continue to advance the wean for 16 hours 2. COVID-19 virus infection in resolution 3. Atrial fibrillation rate controlled 4. Severe sepsis hemodynamics are stable 5. Pneumonia due to COVID-19 treated we will continue with supportive care   I have personally seen and evaluated the patient, evaluated laboratory and imaging results, formulated the assessment and plan and placed orders. The Patient requires high complexity decision making with multiple systems involvement.  Rounds were done with the Respiratory Therapy Director and Staff therapists and discussed with nursing staff also.  Allyne Gee, MD [facility] Pulmonary Critical Care Medicine Sleep Medicine

## 2020-01-07 DIAGNOSIS — J9621 Acute and chronic respiratory failure with hypoxia: Secondary | ICD-10-CM | POA: Diagnosis not present

## 2020-01-07 DIAGNOSIS — U071 COVID-19: Secondary | ICD-10-CM | POA: Diagnosis not present

## 2020-01-07 DIAGNOSIS — N179 Acute kidney failure, unspecified: Secondary | ICD-10-CM | POA: Diagnosis not present

## 2020-01-07 DIAGNOSIS — I4891 Unspecified atrial fibrillation: Secondary | ICD-10-CM | POA: Diagnosis not present

## 2020-01-07 NOTE — Progress Notes (Signed)
[facility] [place]   PULMONARY CRITICAL CARE SERVICE  PROGRESS NOTE  Date of Service: 01/07/2020  Shane Banks  YQM:[id number]  DOB: 1946/01/05   DOA: 11/21/2019  Referring Physician: [name], MD  HPI: Shane Banks is a 74 y.o. male seen for follow up of Acute on Chronic Respiratory Failure.  Patient is comfortable right now without distress at this time remains on assist control mode  Medications: Reviewed on Rounds  Physical Exam:  Vitals: Temperature is 96.5 pulse 92 respiratory rate is 25 blood pressure is 126/60 saturations 100%  Ventilator Settings on assist control FiO2 is 45% tidal volume 500 PEEP 5  . General: Comfortable at this time . Eyes: Grossly normal lids, irises & conjunctiva . ENT: grossly tongue is normal . Neck: no obvious mass . Cardiovascular: S1 S2 normal no gallop . Respiratory: Scattered rhonchi expansion is equal . Abdomen: soft . Skin: no rash seen on limited exam . Musculoskeletal: not rigid . Psychiatric:unable to assess . Neurologic: no seizure no involuntary movements         Lab Data:   Basic Metabolic Panel: Recent Labs  Lab 01/01/20 0811 01/03/20 0900 01/05/20 0509  NA 134* 135 134*  K 3.9 3.6 3.8  CL 97* 96* 96*  CO2 27 29 28   GLUCOSE 132* 116* 142*  BUN 110* 77* 78*  CREATININE 2.67* 2.15* 2.07*  CALCIUM 9.1 8.8* 9.0  PHOS 3.6 3.2 2.7    ABG: No results for input(s): PHART, PCO2ART, PO2ART, HCO3, O2SAT in the last 168 hours.  Liver Function Tests: Recent Labs  Lab 01/01/20 0811 01/03/20 0900 01/05/20 0509  ALBUMIN 1.9* 1.9* 1.9*   No results for input(s): LIPASE, AMYLASE in the last 168 hours. No results for input(s): AMMONIA in the last 168 hours.  CBC: Recent Labs  Lab 01/01/20 0806 01/03/20 0900 01/05/20 0509  WBC 18.5* 19.3* 15.7*  HGB 7.7* 7.6* 7.8*  HCT 24.0* 24.1* 25.0*  MCV 85.1 86.1 87.1  PLT 310 296 292    Cardiac Enzymes: No results for  input(s): CKTOTAL, CKMB, CKMBINDEX, TROPONINI in the last 168 hours.  BNP (last 3 results) No results for input(s): BNP in the last 8760 hours.  ProBNP (last 3 results) No results for input(s): PROBNP in the last 8760 hours.  Radiological Exams: No results found.  Assessment/Plan Active Problems:   Acute on chronic respiratory failure with hypoxia (HCC)   COVID-19 virus infection   Atrial fibrillation with RVR (HCC)   Severe sepsis (HCC)   Pneumonia due to COVID-19 virus   1. Acute on chronic respiratory failure hypoxia plan is to continue with the assist control for now check the RSB I mechanics and try to wean. 2. COVID-19 virus infection treated in resolution continue with supportive care 3. Atrial fibrillation rate controlled we will continue to monitor 4. Severe sepsis resolved hemodynamics are stable 5. Pneumonia due to COVID-19 treated   I have personally seen and evaluated the patient, evaluated laboratory and imaging results, formulated the assessment and plan and placed orders. The Patient requires high complexity decision making with multiple systems involvement.  Rounds were done with the Respiratory Therapy Director and Staff therapists and discussed with nursing staff also.  Allyne Gee, MD [facility] Pulmonary Critical Care Medicine Sleep Medicine

## 2020-01-08 DIAGNOSIS — N179 Acute kidney failure, unspecified: Secondary | ICD-10-CM | POA: Diagnosis not present

## 2020-01-08 DIAGNOSIS — U071 COVID-19: Secondary | ICD-10-CM | POA: Diagnosis not present

## 2020-01-08 DIAGNOSIS — J9621 Acute and chronic respiratory failure with hypoxia: Secondary | ICD-10-CM | POA: Diagnosis not present

## 2020-01-08 DIAGNOSIS — I4891 Unspecified atrial fibrillation: Secondary | ICD-10-CM | POA: Diagnosis not present

## 2020-01-08 LAB — RENAL FUNCTION PANEL
Albumin: 1.9 g/dL — ABNORMAL LOW (ref 3.5–5.0)
Anion gap: 10 (ref 5–15)
BUN: 91 mg/dL — ABNORMAL HIGH (ref 8–23)
CO2: 29 mmol/L (ref 22–32)
Calcium: 9.1 mg/dL (ref 8.9–10.3)
Chloride: 96 mmol/L — ABNORMAL LOW (ref 98–111)
Creatinine, Ser: 2.37 mg/dL — ABNORMAL HIGH (ref 0.61–1.24)
GFR calc Af Amer: 31 mL/min — ABNORMAL LOW (ref >60)
GFR calc non Af Amer: 26 mL/min — ABNORMAL LOW (ref >60)
Glucose, Bld: 122 mg/dL — ABNORMAL HIGH (ref 70–99)
Phosphorus: 2.7 mg/dL (ref 2.5–4.6)
Potassium: 3.7 mmol/L (ref 3.5–5.1)
Sodium: 135 mmol/L (ref 135–145)

## 2020-01-08 LAB — CBC
HCT: 23.7 % — ABNORMAL LOW (ref 39.0–52.0)
Hemoglobin: 7.4 g/dL — ABNORMAL LOW (ref 13.0–17.0)
MCH: 26.7 pg (ref 26.0–34.0)
MCHC: 31.2 g/dL (ref 30.0–36.0)
MCV: 85.6 fL (ref 80.0–100.0)
Platelets: 292 10*3/uL (ref 150–400)
RBC: 2.77 MIL/uL — ABNORMAL LOW (ref 4.22–5.81)
RDW: 19.2 % — ABNORMAL HIGH (ref 11.5–15.5)
WBC: 16.1 10*3/uL — ABNORMAL HIGH (ref 4.0–10.5)
nRBC: 0 % (ref 0.0–0.2)

## 2020-01-08 NOTE — Progress Notes (Signed)
Let me know [facility]  ROUNDING NOTE   Subjective:  Patient seen at bedside this AM.   Still on the ventilator. Continues to have considerable third spacing of fluid.  Objective:  Vital signs in last 24 hours:  Temperature 98.1 pulse 81 respirations 31 blood pressure 172/71  Physical Exam: General: Critically ill-appearing  Head: Normocephalic, atraumatic. Moist oral mucosal membranes  Eyes: Anicteric  Neck: Tracheostomy in place  Lungs:  Scattered rhonchi, vent assisted, tachypneic  Heart: S1S2 no rubs  Abdomen:  Soft, nontender, bowel sounds present, PEG tube in place  Extremities: 3+ peripheral edema  Neurologic: Awake but not following commands  Skin: No rash  Access: Right IJ temporary dialysis catheter    Basic Metabolic Panel: Recent Labs  Lab 01/03/20 0900 01/05/20 0509 01/08/20 0607  NA 135 134* 135  K 3.6 3.8 3.7  CL 96* 96* 96*  CO2 29 28 29   GLUCOSE 116* 142* 122*  BUN 77* 78* 91*  CREATININE 2.15* 2.07* 2.37*  CALCIUM 8.8* 9.0 9.1  PHOS 3.2 2.7 2.7    Liver Function Tests: Recent Labs  Lab 01/03/20 0900 01/05/20 0509 01/08/20 0607  ALBUMIN 1.9* 1.9* 1.9*   No results for input(s): LIPASE, AMYLASE in the last 168 hours. No results for input(s): AMMONIA in the last 168 hours.  CBC: Recent Labs  Lab 01/03/20 0900 01/05/20 0509 01/08/20 0607  WBC 19.3* 15.7* 16.1*  HGB 7.6* 7.8* 7.4*  HCT 24.1* 25.0* 23.7*  MCV 86.1 87.1 85.6  PLT 296 292 292    Cardiac Enzymes: No results for input(s): CKTOTAL, CKMB, CKMBINDEX, TROPONINI in the last 168 hours.  BNP: Invalid input(s): POCBNP  CBG: No results for input(s): GLUCAP in the last 168 hours.  Microbiology: Results for orders placed or performed during the hospital encounter of 11/21/19  Culture, Urine     Status: Abnormal   Collection Time: 11/22/19  1:25 PM   Specimen: Urine, Random  Result Value Ref Range Status   Specimen Description URINE, RANDOM  Final   Special  Requests   Final    NONE Performed at [facility], [address], [place], [place] [address]    Culture 50,000 COLONIES/mL YEAST (A)  Final   Report Status 11/23/2019 FINAL  Final  Culture, blood (routine x 2)     Status: None   Collection Time: 11/22/19  1:40 PM   Specimen: BLOOD LEFT HAND  Result Value Ref Range Status   Specimen Description BLOOD LEFT HAND  Final   Special Requests   Final    BOTTLES DRAWN AEROBIC ONLY Blood Culture results may not be optimal due to an inadequate volume of blood received in culture bottles   Culture   Final    NO GROWTH 5 DAYS Performed at [facility], [place] [address], [place], [place] [address]    Report Status 11/27/2019 FINAL  Final  Culture, blood (routine x 2)     Status: None   Collection Time: 11/22/19  1:44 PM   Specimen: BLOOD LEFT HAND  Result Value Ref Range Status   Specimen Description BLOOD LEFT HAND  Final   Special Requests   Final    BOTTLES DRAWN AEROBIC ONLY Blood Culture results may not be optimal due to an inadequate volume of blood received in culture bottles   Culture   Final    NO GROWTH 5 DAYS Performed at [facility], [place] [address], [place], [place] [address]  Report Status 11/27/2019 FINAL  Final  Culture, respiratory (non-expectorated)     Status: None   Collection Time: 11/22/19  4:59 PM   Specimen: Tracheal Aspirate; Respiratory  Result Value Ref Range Status   Specimen Description TRACHEAL ASPIRATE  Final   Special Requests NONE  Final   Gram Stain   Final    ABUNDANT WBC PRESENT, PREDOMINANTLY PMN NO ORGANISMS SEEN    Culture   Final    NO GROWTH 2 DAYS Performed at [facility], [address], [place], [place] [address]    Report Status 11/24/2019 FINAL  Final  Culture, respiratory (non-expectorated)     Status: None   Collection Time: 12/04/19  2:22 AM   Specimen: Tracheal Aspirate; Respiratory  Result Value Ref Range Status   Specimen Description TRACHEAL ASPIRATE   Final   Special Requests NONE  Final   Gram Stain   Final    ABUNDANT WBC PRESENT, PREDOMINANTLY PMN ABUNDANT GRAM NEGATIVE RODS Performed at [facility], [address] [place], [place] [address]    Culture   Final    MODERATE PSEUDOMONAS AERUGINOSA FEW ESCHERICHIA COLI Confirmed Extended Spectrum Beta-Lactamase Producer (ESBL).  In bloodstream infections from ESBL organisms, carbapenems are preferred over piperacillin/tazobactam. They are shown to have a lower risk of mortality.    Report Status 12/09/2019 FINAL  Final   Organism ID, Bacteria PSEUDOMONAS AERUGINOSA  Final   Organism ID, Bacteria ESCHERICHIA COLI  Final      Susceptibility   Escherichia coli - MIC*    AMPICILLIN >=32 RESISTANT Resistant     CEFAZOLIN >=64 RESISTANT Resistant     CEFEPIME 2 SENSITIVE Sensitive     CEFTAZIDIME RESISTANT Resistant     CEFTRIAXONE >=64 RESISTANT Resistant     CIPROFLOXACIN <=0.25 SENSITIVE Sensitive     GENTAMICIN >=16 RESISTANT Resistant     IMIPENEM <=0.25 SENSITIVE Sensitive     TRIMETH/SULFA >=320 RESISTANT Resistant     AMPICILLIN/SULBACTAM 16 INTERMEDIATE Intermediate     PIP/TAZO <=4 SENSITIVE Sensitive     * FEW ESCHERICHIA COLI   Pseudomonas aeruginosa - MIC*    CEFTAZIDIME 16 INTERMEDIATE Intermediate     CIPROFLOXACIN >=4 RESISTANT Resistant     GENTAMICIN 2 SENSITIVE Sensitive     IMIPENEM >=16 RESISTANT Resistant     * MODERATE PSEUDOMONAS AERUGINOSA  Culture, Urine     Status: Abnormal   Collection Time: 12/04/19  6:33 PM   Specimen: Urine, Random  Result Value Ref Range Status   Specimen Description URINE, RANDOM  Final   Special Requests   Final    NONE Performed at [facility], [address] [place], [place] [address]    Culture >=100,000 COLONIES/mL PSEUDOMONAS AERUGINOSA (A)  Final   Report Status 12/06/2019 FINAL  Final   Organism ID, Bacteria PSEUDOMONAS AERUGINOSA (A)  Final      Susceptibility   Pseudomonas aeruginosa - MIC*     CEFTAZIDIME 4 SENSITIVE Sensitive     CIPROFLOXACIN >=4 RESISTANT Resistant     GENTAMICIN 4 SENSITIVE Sensitive     IMIPENEM 2 SENSITIVE Sensitive     PIP/TAZO 16 SENSITIVE Sensitive     * >=100,000 COLONIES/mL PSEUDOMONAS AERUGINOSA  Culture, blood (routine x 2)     Status: Abnormal   Collection Time: 12/11/19  9:39 AM   Specimen: BLOOD LEFT HAND  Result Value Ref Range Status   Specimen Description BLOOD LEFT HAND  Final   Special Requests  Final    BOTTLES DRAWN AEROBIC AND ANAEROBIC Blood Culture adequate volume   Culture  Setup Time   Final    GRAM POSITIVE COCCI IN CLUSTERS ANAEROBIC BOTTLE ONLY CRITICAL RESULT CALLED TO, READ BACK BY AND VERIFIED WITH: RN R [place] [id number] AT 14 BY CM    Culture (A)  Final    STAPHYLOCOCCUS SPECIES (COAGULASE NEGATIVE) THE SIGNIFICANCE OF ISOLATING THIS ORGANISM FROM A SINGLE SET OF BLOOD CULTURES WHEN MULTIPLE SETS ARE DRAWN IS UNCERTAIN. PLEASE NOTIFY THE MICROBIOLOGY DEPARTMENT WITHIN ONE WEEK IF SPECIATION AND SENSITIVITIES ARE REQUIRED. Performed at [facility], [place] [address], [place], [place] [address]    Report Status 12/14/2019 FINAL  Final  Culture, blood (routine x 2)     Status: None   Collection Time: 12/11/19  9:47 AM   Specimen: BLOOD  Result Value Ref Range Status   Specimen Description BLOOD RIGHT ANTECUBITAL  Final   Special Requests   Final    BOTTLES DRAWN AEROBIC AND ANAEROBIC Blood Culture adequate volume   Culture  Setup Time   Final    CORRECTED RESULTS NO ORGANISMS SEEN PREVIOUSLY REPORTED AS: GRAM POSITIVE COCCI IN CLUSTERS CORRECTED RESULTS CALLED TO: RN R [place] [id number] AT 920 AM BY CM    Culture   Final    NO GROWTH 5 DAYS Performed at [facility], [place] [address], [place], [place] [address]    Report Status 12/16/2019 FINAL  Final  Culture, respiratory (non-expectorated)     Status: None   Collection Time: 12/11/19 10:27 AM   Specimen: Tracheal Aspirate; Respiratory  Result Value Ref  Range Status   Specimen Description TRACHEAL ASPIRATE  Final   Special Requests NONE  Final   Gram Stain NO WBC SEEN NO ORGANISMS SEEN   Final   Culture   Final    FEW PSEUDOMONAS AERUGINOSA FEW STENOTROPHOMONAS MALTOPHILIA SEE SEPARATE REPORT IN [facility] FOR DOS 12/11/19. Performed at [facility], [place] [address], [place], [place] [address]    Report Status 12/26/2019 FINAL  Final   Organism ID, Bacteria PSEUDOMONAS AERUGINOSA  Final   Organism ID, Bacteria STENOTROPHOMONAS MALTOPHILIA  Final      Susceptibility   Pseudomonas aeruginosa - MIC*    CEFTAZIDIME 16 INTERMEDIATE Intermediate     CIPROFLOXACIN >=4 RESISTANT Resistant     GENTAMICIN 2 SENSITIVE Sensitive     IMIPENEM >=16 RESISTANT Resistant     * FEW PSEUDOMONAS AERUGINOSA   Stenotrophomonas maltophilia - MIC*    LEVOFLOXACIN 0.5 SENSITIVE Sensitive     TRIMETH/SULFA <=20 SENSITIVE Sensitive     * FEW STENOTROPHOMONAS MALTOPHILIA  Culture, Urine     Status: Abnormal   Collection Time: 12/11/19 11:30 AM   Specimen: Urine, Random  Result Value Ref Range Status   Specimen Description URINE, RANDOM  Final   Special Requests NONE  Final   Culture (A)  Final    <10,000 COLONIES/mL INSIGNIFICANT GROWTH Performed at [facility], [place] [address] [place], [place] [address]    Report Status 12/12/2019 FINAL  Final  Culture, blood (routine x 2)     Status: None   Collection Time: 12/15/19 12:37 PM   Specimen: BLOOD LEFT HAND  Result Value Ref Range Status   Specimen Description BLOOD LEFT HAND  Final   Special Requests   Final    BOTTLES DRAWN AEROBIC AND ANAEROBIC Blood Culture adequate volume   Culture   Final    NO  GROWTH 5 DAYS Performed at [facility], [place] [address], [place], [place] [address]    Report Status 12/20/2019 FINAL  Final  Culture, blood (routine x 2)     Status: None   Collection Time: 12/15/19 12:42 PM   Specimen: BLOOD LEFT HAND  Result Value Ref Range Status   Specimen  Description BLOOD LEFT HAND  Final   Special Requests   Final    BOTTLES DRAWN AEROBIC AND ANAEROBIC Blood Culture adequate volume   Culture   Final    NO GROWTH 5 DAYS Performed at [facility], [place] [address] [place], [place] [address]    Report Status 12/20/2019 FINAL  Final  C Difficile Quick Screen w PCR reflex     Status: None   Collection Time: 12/17/19  7:00 PM  Result Value Ref Range Status   C Diff antigen NEGATIVE NEGATIVE Final   C Diff toxin NEGATIVE NEGATIVE Final   C Diff interpretation No C. difficile detected.  Final    Comment: Performed at [facility], [place] [address], [place], [place] [address]  SARS CORONAVIRUS 2 (TAT 6-24 HRS) Nasopharyngeal Nasopharyngeal Swab     Status: None   Collection Time: 12/20/19 11:45 AM   Specimen: Nasopharyngeal Swab  Result Value Ref Range Status   SARS Coronavirus 2 NEGATIVE NEGATIVE Final    Comment: (NOTE) SARS-CoV-2 target nucleic acids are NOT DETECTED. The SARS-CoV-2 RNA is generally detectable in upper and lower respiratory specimens during the acute phase of infection. Negative results do not preclude SARS-CoV-2 infection, do not rule out co-infections with other pathogens, and should not be used as the sole basis for treatment or other patient management decisions. Negative results must be combined with clinical observations, patient history, and epidemiological information. The expected result is Negative. Fact Sheet for Patients: SugarRoll.be Fact Sheet for Healthcare Providers: https://www.woods-mathews.com/ This test is not yet approved or cleared by the [place] FDA and  has been authorized for detection and/or diagnosis of SARS-CoV-2 by FDA under an Emergency Use Authorization (EUA). This EUA will remain  in effect (meaning this test can be used) for the duration of the COVID-19 declaration under Section 56 4(b)(1) of the Act, 21 U.S.C. section  360bbb-3(b)(1), unless the authorization is terminated or revoked sooner. Performed at [facility], [place] [address], [place], [place] [address]   Body fluid culture     Status: None   Collection Time: 12/21/19 12:10 PM   Specimen: Lung, Left; Pleural Fluid  Result Value Ref Range Status   Specimen Description PLEURAL FLUID  Final   Special Requests LEFT LUNG THORA  Final   Gram Stain   Final    RARE WBC PRESENT, PREDOMINANTLY MONONUCLEAR NO ORGANISMS SEEN    Culture   Final    NO GROWTH 3 DAYS Performed at [facility], [place] [address], [place], [place] [address]    Report Status 12/24/2019 FINAL  Final  Culture, blood (routine x 2)     Status: None   Collection Time: 12/25/19  2:16 PM   Specimen: BLOOD RIGHT HAND  Result Value Ref Range Status   Specimen Description BLOOD RIGHT HAND  Final   Special Requests   Final    BOTTLES DRAWN AEROBIC ONLY Blood Culture adequate volume   Culture   Final    NO GROWTH 5 DAYS Performed at [facility], [place] [address], [place], [place] [address]    Report  Status 01/01/2020 FINAL  Final  Culture, blood (routine x 2)     Status: None   Collection Time: 12/25/19  2:18 PM   Specimen: BLOOD LEFT HAND  Result Value Ref Range Status   Specimen Description BLOOD LEFT HAND  Final   Special Requests   Final    BOTTLES DRAWN AEROBIC ONLY Blood Culture adequate volume   Culture   Final    NO GROWTH 5 DAYS Performed at [facility], [place] [address], [place], [place] [address]    Report Status 01/01/2020 FINAL  Final  Culture, respiratory (non-expectorated)     Status: None   Collection Time: 12/25/19  4:20 PM   Specimen: Tracheal Aspirate; Respiratory  Result Value Ref Range Status   Specimen Description TRACHEAL ASPIRATE  Final   Special Requests NONE  Final   Gram Stain   Final    ABUNDANT WBC PRESENT, PREDOMINANTLY PMN NO ORGANISMS SEEN    Culture   Final    RARE Consistent with normal respiratory  flora. Performed at [facility], [place] [address] [place], [place] [address]    Report Status 12/28/2019 FINAL  Final    Coagulation Studies: No results for input(s): LABPROT, INR in the last 72 hours.  Urinalysis: No results for input(s): COLORURINE, LABSPEC, PHURINE, GLUCOSEU, HGBUR, BILIRUBINUR, KETONESUR, PROTEINUR, UROBILINOGEN, NITRITE, LEUKOCYTESUR in the last 72 hours.  Invalid input(s): APPERANCEUR    Imaging: No results found.   Medications:     heparin, lidocaine, lidocaine  Assessment/ Plan:  74 y.o. male with a PMHx of acute respiratory failure status post COVID-19 infection status post tracheostomy placement, atrial fibrillation, hypertension, CVA, gout, hypertrophic cardiomyopathy, who was admitted to [facility] on 11/21/2019 for ongoing treatment of acute respiratory failure.  1.  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  Patient due for hemodialysis treatment today.  Still has considerable upper and lower extremity edema.  Ultrafiltration target as before 2 to 2.5 kg.  2.  Acute respiratory failure secondary to COVID-19 infection and its sequela.  -Patient still appears uncomfortable with respirations.  Respiratory rate was 31 this a.m.  Still has diffuse interstitial changes on chest x-ray.  3.  Hyperkalemia.  Currently resolved.  4.  Hyponatremia.  Serum sodium low normal at 135.  Continue to periodically monitor.       LOS: 0 Shane Banks 4/12/20218:18 AM

## 2020-01-08 NOTE — Progress Notes (Signed)
Pulmonary [name]   PULMONARY CRITICAL CARE SERVICE  PROGRESS NOTE  Date of Service: 01/08/2020  Shane Banks  JHE:[id number]  DOB: [birth year]   DOA: 11/21/2019  Referring Physician: [name], MD  HPI: Shane Banks is a 74 y.o. male seen for follow up of Acute on Chronic Respiratory Failure.  Patient is off the ventilator right now on the NAG on 4 L the goal is for 4 hours  Medications: Reviewed on Rounds  Physical Exam:  Vitals: Temperature is 98.1 pulse 81 respiratory rate 31 blood pressure is 172/71 saturations 99%  Ventilator Settings off the ventilator on the NAG right now 4 L  . General: Comfortable at this time . Eyes: Grossly normal lids, irises & conjunctiva . ENT: grossly tongue is normal . Neck: no obvious mass . Cardiovascular: S1 S2 normal no gallop . Respiratory: No rhonchi no rales are noted . Abdomen: soft . Skin: no rash seen on limited exam . Musculoskeletal: not rigid . Psychiatric:unable to assess . Neurologic: no seizure no involuntary movements         Lab Data:   Basic Metabolic Panel: Recent Labs  Lab 01/03/20 0900 01/05/20 0509 01/08/20 0607  NA 135 134* 135  K 3.6 3.8 3.7  CL 96* 96* 96*  CO2 29 28 29   GLUCOSE 116* 142* 122*  BUN 77* 78* 91*  CREATININE 2.15* 2.07* 2.37*  CALCIUM 8.8* 9.0 9.1  PHOS 3.2 2.7 2.7    ABG: No results for input(s): PHART, PCO2ART, PO2ART, HCO3, O2SAT in the last 168 hours.  Liver Function Tests: Recent Labs  Lab 01/03/20 0900 01/05/20 0509 01/08/20 0607  ALBUMIN 1.9* 1.9* 1.9*   No results for input(s): LIPASE, AMYLASE in the last 168 hours. No results for input(s): AMMONIA in the last 168 hours.  CBC: Recent Labs  Lab 01/03/20 0900 01/05/20 0509 01/08/20 0607  WBC 19.3* 15.7* 16.1*  HGB 7.6* 7.8* 7.4*  HCT 24.1* 25.0* 23.7*  MCV 86.1 87.1 85.6  PLT 296 292 292    Cardiac Enzymes: No results for input(s): CKTOTAL, CKMB, CKMBINDEX,  TROPONINI in the last 168 hours.  BNP (last 3 results) No results for input(s): BNP in the last 8760 hours.  ProBNP (last 3 results) No results for input(s): PROBNP in the last 8760 hours.  Radiological Exams: No results found.  Assessment/Plan Active Problems:   Acute on chronic respiratory failure with hypoxia (HCC)   COVID-19 virus infection   Atrial fibrillation with RVR (HCC)   Severe sepsis (HCC)   Pneumonia due to COVID-19 virus   1. Acute on chronic respiratory failure with hypoxia plan is to continue with the wean off the vent on the NAG currently the goal is for 4 hours 2. COVID-19 virus infection treated we will continue with supportive care 3. Atrial fibrillation rate controlled 4. Severe sepsis hemodynamics are stable 5. Pneumonia due to COVID-19 treated we will continue to follow along with   I have personally seen and evaluated the patient, evaluated laboratory and imaging results, formulated the assessment and plan and placed orders. The Patient requires high complexity decision making with multiple systems involvement.  Rounds were done with the Respiratory Therapy Director and Staff therapists and discussed with nursing staff also.  Allyne Gee, MD [facility] Pulmonary Critical Care Medicine Sleep Medicine

## 2020-01-09 DIAGNOSIS — I4891 Unspecified atrial fibrillation: Secondary | ICD-10-CM | POA: Diagnosis not present

## 2020-01-09 DIAGNOSIS — U071 COVID-19: Secondary | ICD-10-CM | POA: Diagnosis not present

## 2020-01-09 DIAGNOSIS — J9621 Acute and chronic respiratory failure with hypoxia: Secondary | ICD-10-CM | POA: Diagnosis not present

## 2020-01-09 DIAGNOSIS — N179 Acute kidney failure, unspecified: Secondary | ICD-10-CM | POA: Diagnosis not present

## 2020-01-09 NOTE — Progress Notes (Addendum)
Pulmonary [place]   PULMONARY CRITICAL CARE SERVICE  PROGRESS NOTE  Date of Service: 01/09/2020  Shane Banks  KXF:[id number]  DOB: 10-Sep-1946   DOA: 11/21/2019  Referring Physician: [name], MD  HPI: Shane Banks is a 74 y.o. male seen for follow up of Acute on Chronic Respiratory Failure.  Patient is on the NAG on 4 L for a goal of 8 hours  Medications: Reviewed on Rounds  Physical Exam:  Vitals: Temperature is 98.6 pulse 63 respiratory 30 blood pressure is 110/59 saturations 99%  Ventilator Settings off the ventilator on NAG on 4 L  . General: Comfortable at this time . Eyes: Grossly normal lids, irises & conjunctiva . ENT: grossly tongue is normal . Neck: no obvious mass . Cardiovascular: S1 S2 normal no gallop . Respiratory: No rhonchi no rales are noted at this time . Abdomen: soft . Skin: no rash seen on limited exam . Musculoskeletal: not rigid . Psychiatric:unable to assess . Neurologic: no seizure no involuntary movements         Lab Data:   Basic Metabolic Panel: Recent Labs  Lab 01/03/20 0900 01/05/20 0509 01/08/20 0607  NA 135 134* 135  K 3.6 3.8 3.7  CL 96* 96* 96*  CO2 29 28 29   GLUCOSE 116* 142* 122*  BUN 77* 78* 91*  CREATININE 2.15* 2.07* 2.37*  CALCIUM 8.8* 9.0 9.1  PHOS 3.2 2.7 2.7    ABG: No results for input(s): PHART, PCO2ART, PO2ART, HCO3, O2SAT in the last 168 hours.  Liver Function Tests: Recent Labs  Lab 01/03/20 0900 01/05/20 0509 01/08/20 0607  ALBUMIN 1.9* 1.9* 1.9*   No results for input(s): LIPASE, AMYLASE in the last 168 hours. No results for input(s): AMMONIA in the last 168 hours.  CBC: Recent Labs  Lab 01/03/20 0900 01/05/20 0509 01/08/20 0607  WBC 19.3* 15.7* 16.1*  HGB 7.6* 7.8* 7.4*  HCT 24.1* 25.0* 23.7*  MCV 86.1 87.1 85.6  PLT 296 292 292    Cardiac Enzymes: No results for input(s): CKTOTAL, CKMB, CKMBINDEX, TROPONINI in the last 168  hours.  BNP (last 3 results) No results for input(s): BNP in the last 8760 hours.  ProBNP (last 3 results) No results for input(s): PROBNP in the last 8760 hours.  Radiological Exams: No results found.  Assessment/Plan Active Problems:   Acute on chronic respiratory failure with hypoxia (HCC)   COVID-19 virus infection   Atrial fibrillation with RVR (HCC)   Severe sepsis (HCC)   Pneumonia due to COVID-19 virus   1. Acute on chronic respiratory failure hypoxia plan is going to be to continue to NAG for a goal of 8 hours. 2. COVID-19 virus infection resolved 3. Atrial fibrillation rate controlled 4. Severe sepsis resolved hemodynamics are stable 5. COVID-19 treated we will continue to follow with x-rays   I have personally seen and evaluated the patient, evaluated laboratory and imaging results, formulated the assessment and plan and placed orders. The Patient requires high complexity decision making with multiple systems involvement.  Rounds were done with the Respiratory Therapy Director and Staff therapists and discussed with nursing staff also.  Allyne Gee, MD [facility] Pulmonary Critical Care Medicine Sleep Medicine

## 2020-01-10 DIAGNOSIS — I4891 Unspecified atrial fibrillation: Secondary | ICD-10-CM | POA: Diagnosis not present

## 2020-01-10 DIAGNOSIS — U071 COVID-19: Secondary | ICD-10-CM | POA: Diagnosis not present

## 2020-01-10 DIAGNOSIS — J9621 Acute and chronic respiratory failure with hypoxia: Secondary | ICD-10-CM | POA: Diagnosis not present

## 2020-01-10 DIAGNOSIS — N179 Acute kidney failure, unspecified: Secondary | ICD-10-CM | POA: Diagnosis not present

## 2020-01-10 LAB — CBC
HCT: 23.3 % — ABNORMAL LOW (ref 39.0–52.0)
Hemoglobin: 7.3 g/dL — ABNORMAL LOW (ref 13.0–17.0)
MCH: 27.2 pg (ref 26.0–34.0)
MCHC: 31.3 g/dL (ref 30.0–36.0)
MCV: 86.9 fL (ref 80.0–100.0)
Platelets: 235 10*3/uL (ref 150–400)
RBC: 2.68 MIL/uL — ABNORMAL LOW (ref 4.22–5.81)
RDW: 18.9 % — ABNORMAL HIGH (ref 11.5–15.5)
WBC: 16.7 10*3/uL — ABNORMAL HIGH (ref 4.0–10.5)
nRBC: 0 % (ref 0.0–0.2)

## 2020-01-10 LAB — RENAL FUNCTION PANEL
Albumin: 1.9 g/dL — ABNORMAL LOW (ref 3.5–5.0)
Anion gap: 10 (ref 5–15)
BUN: 92 mg/dL — ABNORMAL HIGH (ref 8–23)
CO2: 29 mmol/L (ref 22–32)
Calcium: 9.4 mg/dL (ref 8.9–10.3)
Chloride: 95 mmol/L — ABNORMAL LOW (ref 98–111)
Creatinine, Ser: 2.04 mg/dL — ABNORMAL HIGH (ref 0.61–1.24)
GFR calc Af Amer: 37 mL/min — ABNORMAL LOW (ref >60)
GFR calc non Af Amer: 32 mL/min — ABNORMAL LOW (ref >60)
Glucose, Bld: 138 mg/dL — ABNORMAL HIGH (ref 70–99)
Phosphorus: 2.5 mg/dL (ref 2.5–4.6)
Potassium: 3.5 mmol/L (ref 3.5–5.1)
Sodium: 134 mmol/L — ABNORMAL LOW (ref 135–145)

## 2020-01-10 LAB — MAGNESIUM: Magnesium: 1.7 mg/dL (ref 1.7–2.4)

## 2020-01-10 NOTE — Progress Notes (Signed)
Let me know [facility]  ROUNDING NOTE   Subjective:  Patient seen and evaluated during hemodialysis treatment. Ultrafiltration target to be increased to 2 to 2.5 kg. A bit more awake and alert today.  Objective:  Vital signs in last 24 hours:  Temperature 97 pulse 70 respirations 34 blood pressure 122/60  Physical Exam: General: Critically ill-appearing  Head: Normocephalic, atraumatic. Moist oral mucosal membranes  Eyes: Anicteric  Neck: Tracheostomy in place  Lungs:  Scattered rhonchi, vent assisted, tachypneic  Heart: S1S2 no rubs  Abdomen:  Soft, nontender, bowel sounds present, PEG tube in place  Extremities: 3+ peripheral edema  Neurologic: Awake, alert, will follow simple commands  Skin: No rash  Access: Right IJ temporary dialysis catheter    Basic Metabolic Panel: Recent Labs  Lab 01/05/20 0509 01/08/20 0607 01/10/20 0604  NA 134* 135 134*  K 3.8 3.7 3.5  CL 96* 96* 95*  CO2 28 29 29   GLUCOSE 142* 122* 138*  BUN 78* 91* 92*  CREATININE 2.07* 2.37* 2.04*  CALCIUM 9.0 9.1 9.4  MG  --   --  1.7  PHOS 2.7 2.7 2.5    Liver Function Tests: Recent Labs  Lab 01/05/20 0509 01/08/20 0607 01/10/20 0604  ALBUMIN 1.9* 1.9* 1.9*   No results for input(s): LIPASE, AMYLASE in the last 168 hours. No results for input(s): AMMONIA in the last 168 hours.  CBC: Recent Labs  Lab 01/05/20 0509 01/08/20 0607 01/10/20 0604  WBC 15.7* 16.1* 16.7*  HGB 7.8* 7.4* 7.3*  HCT 25.0* 23.7* 23.3*  MCV 87.1 85.6 86.9  PLT 292 292 235    Cardiac Enzymes: No results for input(s): CKTOTAL, CKMB, CKMBINDEX, TROPONINI in the last 168 hours.  BNP: Invalid input(s): POCBNP  CBG: No results for input(s): GLUCAP in the last 168 hours.  Microbiology: Results for orders placed or performed during the hospital encounter of 11/21/19  Culture, Urine     Status: Abnormal   Collection Time: 11/22/19  1:25 PM   Specimen: Urine, Random  Result Value Ref Range  Status   Specimen Description URINE, RANDOM  Final   Special Requests   Final    NONE Performed at [facility], [address] [place], [place] [address]    Culture 50,000 COLONIES/mL YEAST (A)  Final   Report Status 11/23/2019 FINAL  Final  Culture, blood (routine x 2)     Status: None   Collection Time: 11/22/19  1:40 PM   Specimen: BLOOD LEFT HAND  Result Value Ref Range Status   Specimen Description BLOOD LEFT HAND  Final   Special Requests   Final    BOTTLES DRAWN AEROBIC ONLY Blood Culture results may not be optimal due to an inadequate volume of blood received in culture bottles   Culture   Final    NO GROWTH 5 DAYS Performed at [facility], [place] [address], [place], [place] [address]    Report Status 11/27/2019 FINAL  Final  Culture, blood (routine x 2)     Status: None   Collection Time: 11/22/19  1:44 PM   Specimen: BLOOD LEFT HAND  Result Value Ref Range Status   Specimen Description BLOOD LEFT HAND  Final   Special Requests   Final    BOTTLES DRAWN AEROBIC ONLY Blood Culture results may not be optimal due to an inadequate volume of blood received in culture bottles   Culture   Final    NO GROWTH 5 DAYS  Performed at [facility], [place] [address] [place], [place] [address]    Report Status 11/27/2019 FINAL  Final  Culture, respiratory (non-expectorated)     Status: None   Collection Time: 11/22/19  4:59 PM   Specimen: Tracheal Aspirate; Respiratory  Result Value Ref Range Status   Specimen Description TRACHEAL ASPIRATE  Final   Special Requests NONE  Final   Gram Stain   Final    ABUNDANT WBC PRESENT, PREDOMINANTLY PMN NO ORGANISMS SEEN    Culture   Final    NO GROWTH 2 DAYS Performed at [facility], [address], [place], [place] [address]    Report Status 11/24/2019 FINAL  Final  Culture, respiratory (non-expectorated)     Status: None   Collection Time: 12/04/19  2:22 AM   Specimen: Tracheal Aspirate; Respiratory  Result  Value Ref Range Status   Specimen Description TRACHEAL ASPIRATE  Final   Special Requests NONE  Final   Gram Stain   Final    ABUNDANT WBC PRESENT, PREDOMINANTLY PMN ABUNDANT GRAM NEGATIVE RODS Performed at [facility], [address] [place], [place] [address]    Culture   Final    MODERATE PSEUDOMONAS AERUGINOSA FEW ESCHERICHIA COLI Confirmed Extended Spectrum Beta-Lactamase Producer (ESBL).  In bloodstream infections from ESBL organisms, carbapenems are preferred over piperacillin/tazobactam. They are shown to have a lower risk of mortality.    Report Status 12/09/2019 FINAL  Final   Organism ID, Bacteria PSEUDOMONAS AERUGINOSA  Final   Organism ID, Bacteria ESCHERICHIA COLI  Final      Susceptibility   Escherichia coli - MIC*    AMPICILLIN >=32 RESISTANT Resistant     CEFAZOLIN >=64 RESISTANT Resistant     CEFEPIME 2 SENSITIVE Sensitive     CEFTAZIDIME RESISTANT Resistant     CEFTRIAXONE >=64 RESISTANT Resistant     CIPROFLOXACIN <=0.25 SENSITIVE Sensitive     GENTAMICIN >=16 RESISTANT Resistant     IMIPENEM <=0.25 SENSITIVE Sensitive     TRIMETH/SULFA >=320 RESISTANT Resistant     AMPICILLIN/SULBACTAM 16 INTERMEDIATE Intermediate     PIP/TAZO <=4 SENSITIVE Sensitive     * FEW ESCHERICHIA COLI   Pseudomonas aeruginosa - MIC*    CEFTAZIDIME 16 INTERMEDIATE Intermediate     CIPROFLOXACIN >=4 RESISTANT Resistant     GENTAMICIN 2 SENSITIVE Sensitive     IMIPENEM >=16 RESISTANT Resistant     * MODERATE PSEUDOMONAS AERUGINOSA  Culture, Urine     Status: Abnormal   Collection Time: 12/04/19  6:33 PM   Specimen: Urine, Random  Result Value Ref Range Status   Specimen Description URINE, RANDOM  Final   Special Requests   Final    NONE Performed at [facility], [address] [place], [place] [address]    Culture >=100,000 COLONIES/mL PSEUDOMONAS AERUGINOSA (A)  Final   Report Status 12/06/2019 FINAL  Final   Organism ID, Bacteria PSEUDOMONAS AERUGINOSA (A)   Final      Susceptibility   Pseudomonas aeruginosa - MIC*    CEFTAZIDIME 4 SENSITIVE Sensitive     CIPROFLOXACIN >=4 RESISTANT Resistant     GENTAMICIN 4 SENSITIVE Sensitive     IMIPENEM 2 SENSITIVE Sensitive     PIP/TAZO 16 SENSITIVE Sensitive     * >=100,000 COLONIES/mL PSEUDOMONAS AERUGINOSA  Culture, blood (routine x 2)     Status: Abnormal   Collection Time: 12/11/19  9:39 AM   Specimen: BLOOD LEFT HAND  Result Value  Ref Range Status   Specimen Description BLOOD LEFT HAND  Final   Special Requests   Final    BOTTLES DRAWN AEROBIC AND ANAEROBIC Blood Culture adequate volume   Culture  Setup Time   Final    GRAM POSITIVE COCCI IN CLUSTERS ANAEROBIC BOTTLE ONLY CRITICAL RESULT CALLED TO, READ BACK BY AND VERIFIED WITH: RN R [place] [id number] AT 44 BY CM    Culture (A)  Final    STAPHYLOCOCCUS SPECIES (COAGULASE NEGATIVE) THE SIGNIFICANCE OF ISOLATING THIS ORGANISM FROM A SINGLE SET OF BLOOD CULTURES WHEN MULTIPLE SETS ARE DRAWN IS UNCERTAIN. PLEASE NOTIFY THE MICROBIOLOGY DEPARTMENT WITHIN ONE WEEK IF SPECIATION AND SENSITIVITIES ARE REQUIRED. Performed at [facility], [place] [address], [place], [place] [address]    Report Status 12/14/2019 FINAL  Final  Culture, blood (routine x 2)     Status: None   Collection Time: 12/11/19  9:47 AM   Specimen: BLOOD  Result Value Ref Range Status   Specimen Description BLOOD RIGHT ANTECUBITAL  Final   Special Requests   Final    BOTTLES DRAWN AEROBIC AND ANAEROBIC Blood Culture adequate volume   Culture  Setup Time   Final    CORRECTED RESULTS NO ORGANISMS SEEN PREVIOUSLY REPORTED AS: GRAM POSITIVE COCCI IN CLUSTERS CORRECTED RESULTS CALLED TO: RN R [place] [id number] AT 920 AM BY CM    Culture   Final    NO GROWTH 5 DAYS Performed at [facility], [place] [address], [place], [place] [address]    Report Status 12/16/2019 FINAL  Final  Culture, respiratory (non-expectorated)     Status: None   Collection Time: 12/11/19 10:27  AM   Specimen: Tracheal Aspirate; Respiratory  Result Value Ref Range Status   Specimen Description TRACHEAL ASPIRATE  Final   Special Requests NONE  Final   Gram Stain NO WBC SEEN NO ORGANISMS SEEN   Final   Culture   Final    FEW PSEUDOMONAS AERUGINOSA FEW STENOTROPHOMONAS MALTOPHILIA SEE SEPARATE REPORT IN [facility] FOR DOS 12/11/19. Performed at [facility], [place] [address] [place], [place] [address]    Report Status 12/26/2019 FINAL  Final   Organism ID, Bacteria PSEUDOMONAS AERUGINOSA  Final   Organism ID, Bacteria STENOTROPHOMONAS MALTOPHILIA  Final      Susceptibility   Pseudomonas aeruginosa - MIC*    CEFTAZIDIME 16 INTERMEDIATE Intermediate     CIPROFLOXACIN >=4 RESISTANT Resistant     GENTAMICIN 2 SENSITIVE Sensitive     IMIPENEM >=16 RESISTANT Resistant     * FEW PSEUDOMONAS AERUGINOSA   Stenotrophomonas maltophilia - MIC*    LEVOFLOXACIN 0.5 SENSITIVE Sensitive     TRIMETH/SULFA <=20 SENSITIVE Sensitive     * FEW STENOTROPHOMONAS MALTOPHILIA  Culture, Urine     Status: Abnormal   Collection Time: 12/11/19 11:30 AM   Specimen: Urine, Random  Result Value Ref Range Status   Specimen Description URINE, RANDOM  Final   Special Requests NONE  Final   Culture (A)  Final    <10,000 COLONIES/mL INSIGNIFICANT GROWTH Performed at [facility], [place] [address], [place], [place] [address]    Report Status 12/12/2019 FINAL  Final  Culture, blood (routine x 2)     Status: None   Collection Time: 12/15/19 12:37 PM   Specimen: BLOOD LEFT HAND  Result Value Ref Range Status   Specimen Description BLOOD LEFT HAND  Final   Special Requests   Final    BOTTLES  DRAWN AEROBIC AND ANAEROBIC Blood Culture adequate volume   Culture   Final    NO GROWTH 5 DAYS Performed at [facility], [place] [address], [place], [place] [address]    Report Status 12/20/2019 FINAL  Final  Culture, blood (routine x 2)     Status: None   Collection Time: 12/15/19 12:42 PM   Specimen:  BLOOD LEFT HAND  Result Value Ref Range Status   Specimen Description BLOOD LEFT HAND  Final   Special Requests   Final    BOTTLES DRAWN AEROBIC AND ANAEROBIC Blood Culture adequate volume   Culture   Final    NO GROWTH 5 DAYS Performed at [facility], [place] [address], [place], [place] [address]    Report Status 12/20/2019 FINAL  Final  C Difficile Quick Screen w PCR reflex     Status: None   Collection Time: 12/17/19  7:00 PM  Result Value Ref Range Status   C Diff antigen NEGATIVE NEGATIVE Final   C Diff toxin NEGATIVE NEGATIVE Final   C Diff interpretation No C. difficile detected.  Final    Comment: Performed at [facility], [place] [address] [place], [place] [address]  SARS CORONAVIRUS 2 (TAT 6-24 HRS) Nasopharyngeal Nasopharyngeal Swab     Status: None   Collection Time: 12/20/19 11:45 AM   Specimen: Nasopharyngeal Swab  Result Value Ref Range Status   SARS Coronavirus 2 NEGATIVE NEGATIVE Final    Comment: (NOTE) SARS-CoV-2 target nucleic acids are NOT DETECTED. The SARS-CoV-2 RNA is generally detectable in upper and lower respiratory specimens during the acute phase of infection. Negative results do not preclude SARS-CoV-2 infection, do not rule out co-infections with other pathogens, and should not be used as the sole basis for treatment or other patient management decisions. Negative results must be combined with clinical observations, patient history, and epidemiological information. The expected result is Negative. Fact Sheet for Patients: SugarRoll.be Fact Sheet for Healthcare Providers: https://www.woods-mathews.com/ This test is not yet approved or cleared by the [place] FDA and  has been authorized for detection and/or diagnosis of SARS-CoV-2 by FDA under an Emergency Use Authorization (EUA). This EUA will remain  in effect (meaning this test can be used) for the duration of the COVID-19 declaration under  Section 56 4(b)(1) of the Act, 21 U.S.C. section 360bbb-3(b)(1), unless the authorization is terminated or revoked sooner. Performed at [facility], [place] [address], [place], [place] [address]   Body fluid culture     Status: None   Collection Time: 12/21/19 12:10 PM   Specimen: Lung, Left; Pleural Fluid  Result Value Ref Range Status   Specimen Description PLEURAL FLUID  Final   Special Requests LEFT LUNG THORA  Final   Gram Stain   Final    RARE WBC PRESENT, PREDOMINANTLY MONONUCLEAR NO ORGANISMS SEEN    Culture   Final    NO GROWTH 3 DAYS Performed at [facility], [place] [address], [place], [place] [address]    Report Status 12/24/2019 FINAL  Final  Culture, blood (routine x 2)     Status: None   Collection Time: 12/25/19  2:16 PM   Specimen: BLOOD RIGHT HAND  Result Value Ref Range Status   Specimen Description BLOOD RIGHT HAND  Final   Special Requests   Final    BOTTLES DRAWN AEROBIC ONLY Blood Culture adequate volume   Culture   Final    NO GROWTH 5  DAYS Performed at [facility], [place] [address], [place], [place] [address]    Report Status 01/01/2020 FINAL  Final  Culture, blood (routine x 2)     Status: None   Collection Time: 12/25/19  2:18 PM   Specimen: BLOOD LEFT HAND  Result Value Ref Range Status   Specimen Description BLOOD LEFT HAND  Final   Special Requests   Final    BOTTLES DRAWN AEROBIC ONLY Blood Culture adequate volume   Culture   Final    NO GROWTH 5 DAYS Performed at [facility], [place] [address] [place], [place] [address]    Report Status 01/01/2020 FINAL  Final  Culture, respiratory (non-expectorated)     Status: None   Collection Time: 12/25/19  4:20 PM   Specimen: Tracheal Aspirate; Respiratory  Result Value Ref Range Status   Specimen Description TRACHEAL ASPIRATE  Final   Special Requests NONE  Final   Gram Stain   Final    ABUNDANT WBC PRESENT, PREDOMINANTLY PMN NO ORGANISMS SEEN    Culture   Final    RARE  Consistent with normal respiratory flora. Performed at [facility], [place] [address] [place], [place] [address]    Report Status 12/28/2019 FINAL  Final    Coagulation Studies: No results for input(s): LABPROT, INR in the last 72 hours.  Urinalysis: No results for input(s): COLORURINE, LABSPEC, PHURINE, GLUCOSEU, HGBUR, BILIRUBINUR, KETONESUR, PROTEINUR, UROBILINOGEN, NITRITE, LEUKOCYTESUR in the last 72 hours.  Invalid input(s): APPERANCEUR    Imaging: No results found.   Medications:     heparin, lidocaine, lidocaine  Assessment/ Plan:  74 y.o. male with a PMHx of acute respiratory failure status post COVID-19 infection status post tracheostomy placement, atrial fibrillation, hypertension, CVA, gout, hypertrophic cardiomyopathy, who was admitted to [facility] on 11/21/2019 for ongoing treatment of acute respiratory failure.  1.  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  Patient seen and evaluated during hemodialysis treatment.  Tolerating well.  Increase ultrafiltration target of 2 to 2.5 kg.  Also administered albumin.  2.  Acute respiratory failure secondary to COVID-19 infection and its sequela.  -Patient has been difficult to wean from the ventilator.  Continue ventilatory support at this time.  3.  Hyperkalemia.  Resolved with ongoing dialysis treatments.  4.  Hyponatremia.  Serum sodium slightly low at 134 but overall improved with initiation of dialysis.       LOS: 0 Beula Joyner 4/14/20219:14 AM

## 2020-01-10 NOTE — Progress Notes (Signed)
Pulmonary Critical Care Medicine [place]   PULMONARY CRITICAL CARE SERVICE  PROGRESS NOTE  Date of Service: 01/10/2020  Shane Banks  SAY:[id number]  DOB: 05-20-1946   DOA: 11/21/2019  Referring Physician: [name], MD  HPI: Shane Banks is a 74 y.o. male seen for follow up of Acute on Chronic Respiratory Failure.  Patient currently is on the NAG on 5 L of oxygen  Medications: Reviewed on Rounds  Physical Exam:  Vitals: Temperature is 97.0 pulse 70 respiratory 34 blood pressure is 122/60 saturations 100%  Ventilator Settings on NAG  . General: Comfortable at this time . Eyes: Grossly normal lids, irises & conjunctiva . ENT: grossly tongue is normal . Neck: no obvious mass . Cardiovascular: S1 S2 normal no gallop . Respiratory: No rhonchi no rales are noted at this time . Abdomen: soft . Skin: no rash seen on limited exam . Musculoskeletal: not rigid . Psychiatric:unable to assess . Neurologic: no seizure no involuntary movements         Lab Data:   Basic Metabolic Panel: Recent Labs  Lab 01/05/20 0509 01/08/20 0607 01/10/20 0604  NA 134* 135 134*  K 3.8 3.7 3.5  CL 96* 96* 95*  CO2 28 29 29   GLUCOSE 142* 122* 138*  BUN 78* 91* 92*  CREATININE 2.07* 2.37* 2.04*  CALCIUM 9.0 9.1 9.4  MG  --   --  1.7  PHOS 2.7 2.7 2.5    ABG: No results for input(s): PHART, PCO2ART, PO2ART, HCO3, O2SAT in the last 168 hours.  Liver Function Tests: Recent Labs  Lab 01/05/20 0509 01/08/20 0607 01/10/20 0604  ALBUMIN 1.9* 1.9* 1.9*   No results for input(s): LIPASE, AMYLASE in the last 168 hours. No results for input(s): AMMONIA in the last 168 hours.  CBC: Recent Labs  Lab 01/05/20 0509 01/08/20 0607 01/10/20 0604  WBC 15.7* 16.1* 16.7*  HGB 7.8* 7.4* 7.3*  HCT 25.0* 23.7* 23.3*  MCV 87.1 85.6 86.9  PLT 292 292 235    Cardiac Enzymes: No results for input(s): CKTOTAL, CKMB, CKMBINDEX, TROPONINI in the last 168 hours.  BNP (last  3 results) No results for input(s): BNP in the last 8760 hours.  ProBNP (last 3 results) No results for input(s): PROBNP in the last 8760 hours.  Radiological Exams: No results found.  Assessment/Plan Active Problems:   Acute on chronic respiratory failure with hypoxia (HCC)   COVID-19 virus infection   Atrial fibrillation with RVR (HCC)   Severe sepsis (HCC)   Pneumonia due to COVID-19 virus   1. Acute on chronic respiratory failure with hypoxia plan is to continue with the NAG on 5 L the goal today will be 12 hours 2. COVID-19 virus infection treated in resolution phase 3. Atrial fibrillation rate controlled 4. Severe sepsis hemodynamically stable 5. Pneumonia due to COVID-19 treated clinically improving   I have personally seen and evaluated the patient, evaluated laboratory and imaging results, formulated the assessment and plan and placed orders. The Patient requires high complexity decision making with multiple systems involvement.  Rounds were done with the Respiratory Therapy Director and Staff therapists and discussed with nursing staff also.  Allyne Gee, MD [facility] Pulmonary Critical Care Medicine Sleep Medicine

## 2020-01-11 ENCOUNTER — Other Ambulatory Visit (HOSPITAL_COMMUNITY): Payer: Medicare HMO

## 2020-01-11 DIAGNOSIS — N179 Acute kidney failure, unspecified: Secondary | ICD-10-CM | POA: Diagnosis not present

## 2020-01-11 DIAGNOSIS — U071 COVID-19: Secondary | ICD-10-CM | POA: Diagnosis not present

## 2020-01-11 DIAGNOSIS — J9621 Acute and chronic respiratory failure with hypoxia: Secondary | ICD-10-CM | POA: Diagnosis not present

## 2020-01-11 DIAGNOSIS — I4891 Unspecified atrial fibrillation: Secondary | ICD-10-CM | POA: Diagnosis not present

## 2020-01-11 NOTE — Progress Notes (Signed)
Pulmonary Critical Care Medicine [place]   PULMONARY CRITICAL CARE SERVICE  PROGRESS NOTE  Date of Service: 01/11/2020  Girolamo Lortie  WUJ:[id number]  DOB: 1946-07-02   DOA: 11/21/2019  Referring Physician: [name], MD  HPI: Joffrey Kerce is a 74 y.o. male seen for follow up of Acute on Chronic Respiratory Failure.  Patient has been the NAG on 6 L of O2.  Was noted to have frothy pink secretions which were rather copious.  Recommended getting a chest x-ray which will be ordered  Medications: Reviewed on Rounds  Physical Exam:  Vitals: Temperature 97.4 pulse 65 respiratory 32 blood pressure is 134/72 saturations 95%  Ventilator Settings on NAG on 6 L O2  . General: Comfortable at this time . Eyes: Grossly normal lids, irises & conjunctiva . ENT: grossly tongue is normal . Neck: no obvious mass . Cardiovascular: S1 S2 normal no gallop . Respiratory: +rhonchi +rales are noted at this time . Abdomen: soft . Skin: no rash seen on limited exam . Musculoskeletal: not rigid . Psychiatric:unable to assess . Neurologic: no seizure no involuntary movements         Lab Data:   Basic Metabolic Panel: Recent Labs  Lab 01/05/20 0509 01/08/20 0607 01/10/20 0604  NA 134* 135 134*  K 3.8 3.7 3.5  CL 96* 96* 95*  CO2 28 29 29   GLUCOSE 142* 122* 138*  BUN 78* 91* 92*  CREATININE 2.07* 2.37* 2.04*  CALCIUM 9.0 9.1 9.4  MG  --   --  1.7  PHOS 2.7 2.7 2.5    ABG: No results for input(s): PHART, PCO2ART, PO2ART, HCO3, O2SAT in the last 168 hours.  Liver Function Tests: Recent Labs  Lab 01/05/20 0509 01/08/20 0607 01/10/20 0604  ALBUMIN 1.9* 1.9* 1.9*   No results for input(s): LIPASE, AMYLASE in the last 168 hours. No results for input(s): AMMONIA in the last 168 hours.  CBC: Recent Labs  Lab 01/05/20 0509 01/08/20 0607 01/10/20 0604  WBC 15.7* 16.1* 16.7*  HGB 7.8* 7.4* 7.3*  HCT 25.0* 23.7* 23.3*  MCV 87.1 85.6 86.9  PLT 292 292 235     Cardiac Enzymes: No results for input(s): CKTOTAL, CKMB, CKMBINDEX, TROPONINI in the last 168 hours.  BNP (last 3 results) No results for input(s): BNP in the last 8760 hours.  ProBNP (last 3 results) No results for input(s): PROBNP in the last 8760 hours.  Radiological Exams: No results found.  Assessment/Plan Active Problems:   Acute on chronic respiratory failure with hypoxia (HCC)   COVID-19 virus infection   Atrial fibrillation with RVR (HCC)   Severe sepsis (HCC)   Pneumonia due to COVID-19 virus   1. Acute on chronic respiratory failure with hypoxia patient currently is on the NAG on 6 L of O2 could be also having acute heart failure so recommended getting a chest film. 2. COVID-19 virus infection in resolution phase we will continue with supportive care as already mentioned chest x-ray been ordered. 3. Atrial fibrillation with RVR rate is controlled continue with medical management. 4. Severe sepsis hemodynamics are stable we will continue with supportive care 5. Pneumonia due to COVID-19 continue present management   I have personally seen and evaluated the patient, evaluated laboratory and imaging results, formulated the assessment and plan and placed orders. The Patient requires high complexity decision making with multiple systems involvement.  Rounds were done with the Respiratory Therapy Director and Staff therapists and discussed with nursing staff also.  Elvin Banker  Richardson Dopp, MD [facility] Pulmonary Critical Care Medicine Sleep Medicine

## 2020-01-12 LAB — CBC
HCT: 23.3 % — ABNORMAL LOW (ref 39.0–52.0)
Hemoglobin: 7.3 g/dL — ABNORMAL LOW (ref 13.0–17.0)
MCH: 26.9 pg (ref 26.0–34.0)
MCHC: 31.3 g/dL (ref 30.0–36.0)
MCV: 86 fL (ref 80.0–100.0)
Platelets: 224 10*3/uL (ref 150–400)
RBC: 2.71 MIL/uL — ABNORMAL LOW (ref 4.22–5.81)
RDW: 18.8 % — ABNORMAL HIGH (ref 11.5–15.5)
WBC: 19.6 10*3/uL — ABNORMAL HIGH (ref 4.0–10.5)
nRBC: 0 % (ref 0.0–0.2)

## 2020-01-12 LAB — RENAL FUNCTION PANEL
Albumin: 2.2 g/dL — ABNORMAL LOW (ref 3.5–5.0)
Anion gap: 11 (ref 5–15)
BUN: 88 mg/dL — ABNORMAL HIGH (ref 8–23)
CO2: 29 mmol/L (ref 22–32)
Calcium: 9.5 mg/dL (ref 8.9–10.3)
Chloride: 96 mmol/L — ABNORMAL LOW (ref 98–111)
Creatinine, Ser: 1.95 mg/dL — ABNORMAL HIGH (ref 0.61–1.24)
GFR calc Af Amer: 39 mL/min — ABNORMAL LOW (ref >60)
GFR calc non Af Amer: 33 mL/min — ABNORMAL LOW (ref >60)
Glucose, Bld: 152 mg/dL — ABNORMAL HIGH (ref 70–99)
Phosphorus: 1.6 mg/dL — ABNORMAL LOW (ref 2.5–4.6)
Potassium: 3.8 mmol/L (ref 3.5–5.1)
Sodium: 136 mmol/L (ref 135–145)

## 2020-01-12 LAB — MAGNESIUM: Magnesium: 2.2 mg/dL (ref 1.7–2.4)

## 2020-01-12 NOTE — Progress Notes (Signed)
Let me know [facility]  ROUNDING NOTE   Subjective:  Patient seen and evaluated at bedside during dialysis treatment. Tolerating well. Still has considerable generalized edema.  Objective:  Vital signs in last 24 hours:  Temperature 99.8 pulse 87 respirations 18 blood pressure 162/75  Physical Exam: General: Critically ill-appearing  Head: Normocephalic, atraumatic. Moist oral mucosal membranes  Eyes: Anicteric  Neck: Tracheostomy in place  Lungs:  Scattered rhonchi, vent assisted, tachypneic  Heart: S1S2 no rubs  Abdomen:  Soft, nontender, bowel sounds present, PEG tube in place  Extremities: 3+ peripheral edema, left upper extremity wrapped  Neurologic: Awake, alert, will follow simple commands  Skin: No rash  Access: Right IJ temporary dialysis catheter    Basic Metabolic Panel: Recent Labs  Lab 01/08/20 0607 01/10/20 0604 01/12/20 0527  NA 135 134* 136  K 3.7 3.5 3.8  CL 96* 95* 96*  CO2 29 29 29   GLUCOSE 122* 138* 152*  BUN 91* 92* 88*  CREATININE 2.37* 2.04* 1.95*  CALCIUM 9.1 9.4 9.5  MG  --  1.7 2.2  PHOS 2.7 2.5 1.6*    Liver Function Tests: Recent Labs  Lab 01/08/20 0607 01/10/20 0604 01/12/20 0527  ALBUMIN 1.9* 1.9* 2.2*   No results for input(s): LIPASE, AMYLASE in the last 168 hours. No results for input(s): AMMONIA in the last 168 hours.  CBC: Recent Labs  Lab 01/08/20 0607 01/10/20 0604 01/12/20 0527  WBC 16.1* 16.7* 19.6*  HGB 7.4* 7.3* 7.3*  HCT 23.7* 23.3* 23.3*  MCV 85.6 86.9 86.0  PLT 292 235 224    Cardiac Enzymes: No results for input(s): CKTOTAL, CKMB, CKMBINDEX, TROPONINI in the last 168 hours.  BNP: Invalid input(s): POCBNP  CBG: No results for input(s): GLUCAP in the last 168 hours.  Microbiology: Results for orders placed or performed during the hospital encounter of 11/21/19  Culture, Urine     Status: Abnormal   Collection Time: 11/22/19  1:25 PM   Specimen: Urine, Random  Result Value Ref  Range Status   Specimen Description URINE, RANDOM  Final   Special Requests   Final    NONE Performed at [facility], [address] [place], [place] [address]    Culture 50,000 COLONIES/mL YEAST (A)  Final   Report Status 11/23/2019 FINAL  Final  Culture, blood (routine x 2)     Status: None   Collection Time: 11/22/19  1:40 PM   Specimen: BLOOD LEFT HAND  Result Value Ref Range Status   Specimen Description BLOOD LEFT HAND  Final   Special Requests   Final    BOTTLES DRAWN AEROBIC ONLY Blood Culture results may not be optimal due to an inadequate volume of blood received in culture bottles   Culture   Final    NO GROWTH 5 DAYS Performed at [facility], [place] [address], [place], [place] [address]    Report Status 11/27/2019 FINAL  Final  Culture, blood (routine x 2)     Status: None   Collection Time: 11/22/19  1:44 PM   Specimen: BLOOD LEFT HAND  Result Value Ref Range Status   Specimen Description BLOOD LEFT HAND  Final   Special Requests   Final    BOTTLES DRAWN AEROBIC ONLY Blood Culture results may not be optimal due to an inadequate volume of blood received in culture bottles   Culture   Final    NO GROWTH 5 DAYS Performed at [facility],  [address] [place], [place] [address]    Report Status 11/27/2019 FINAL  Final  Culture, respiratory (non-expectorated)     Status: None   Collection Time: 11/22/19  4:59 PM   Specimen: Tracheal Aspirate; Respiratory  Result Value Ref Range Status   Specimen Description TRACHEAL ASPIRATE  Final   Special Requests NONE  Final   Gram Stain   Final    ABUNDANT WBC PRESENT, PREDOMINANTLY PMN NO ORGANISMS SEEN    Culture   Final    NO GROWTH 2 DAYS Performed at [facility], [address], [place], [place] [address]    Report Status 11/24/2019 FINAL  Final  Culture, respiratory (non-expectorated)     Status: None   Collection Time: 12/04/19  2:22 AM   Specimen: Tracheal Aspirate; Respiratory   Result Value Ref Range Status   Specimen Description TRACHEAL ASPIRATE  Final   Special Requests NONE  Final   Gram Stain   Final    ABUNDANT WBC PRESENT, PREDOMINANTLY PMN ABUNDANT GRAM NEGATIVE RODS Performed at [facility], [address] [place], [place] [address]    Culture   Final    MODERATE PSEUDOMONAS AERUGINOSA FEW ESCHERICHIA COLI Confirmed Extended Spectrum Beta-Lactamase Producer (ESBL).  In bloodstream infections from ESBL organisms, carbapenems are preferred over piperacillin/tazobactam. They are shown to have a lower risk of mortality.    Report Status 12/09/2019 FINAL  Final   Organism ID, Bacteria PSEUDOMONAS AERUGINOSA  Final   Organism ID, Bacteria ESCHERICHIA COLI  Final      Susceptibility   Escherichia coli - MIC*    AMPICILLIN >=32 RESISTANT Resistant     CEFAZOLIN >=64 RESISTANT Resistant     CEFEPIME 2 SENSITIVE Sensitive     CEFTAZIDIME RESISTANT Resistant     CEFTRIAXONE >=64 RESISTANT Resistant     CIPROFLOXACIN <=0.25 SENSITIVE Sensitive     GENTAMICIN >=16 RESISTANT Resistant     IMIPENEM <=0.25 SENSITIVE Sensitive     TRIMETH/SULFA >=320 RESISTANT Resistant     AMPICILLIN/SULBACTAM 16 INTERMEDIATE Intermediate     PIP/TAZO <=4 SENSITIVE Sensitive     * FEW ESCHERICHIA COLI   Pseudomonas aeruginosa - MIC*    CEFTAZIDIME 16 INTERMEDIATE Intermediate     CIPROFLOXACIN >=4 RESISTANT Resistant     GENTAMICIN 2 SENSITIVE Sensitive     IMIPENEM >=16 RESISTANT Resistant     * MODERATE PSEUDOMONAS AERUGINOSA  Culture, Urine     Status: Abnormal   Collection Time: 12/04/19  6:33 PM   Specimen: Urine, Random  Result Value Ref Range Status   Specimen Description URINE, RANDOM  Final   Special Requests   Final    NONE Performed at [facility], [address] [place], [place] [address]    Culture >=100,000 COLONIES/mL PSEUDOMONAS AERUGINOSA (A)  Final   Report Status 12/06/2019 FINAL  Final   Organism ID, Bacteria PSEUDOMONAS  AERUGINOSA (A)  Final      Susceptibility   Pseudomonas aeruginosa - MIC*    CEFTAZIDIME 4 SENSITIVE Sensitive     CIPROFLOXACIN >=4 RESISTANT Resistant     GENTAMICIN 4 SENSITIVE Sensitive     IMIPENEM 2 SENSITIVE Sensitive     PIP/TAZO 16 SENSITIVE Sensitive     * >=100,000 COLONIES/mL PSEUDOMONAS AERUGINOSA  Culture, blood (routine x 2)     Status: Abnormal   Collection Time: 12/11/19  9:39 AM   Specimen: BLOOD LEFT HAND  Result Value Ref Range Status   Specimen  Description BLOOD LEFT HAND  Final   Special Requests   Final    BOTTLES DRAWN AEROBIC AND ANAEROBIC Blood Culture adequate volume   Culture  Setup Time   Final    GRAM POSITIVE COCCI IN CLUSTERS ANAEROBIC BOTTLE ONLY CRITICAL RESULT CALLED TO, READ BACK BY AND VERIFIED WITH: RN R [place] [id number] AT 66 BY CM    Culture (A)  Final    STAPHYLOCOCCUS SPECIES (COAGULASE NEGATIVE) THE SIGNIFICANCE OF ISOLATING THIS ORGANISM FROM A SINGLE SET OF BLOOD CULTURES WHEN MULTIPLE SETS ARE DRAWN IS UNCERTAIN. PLEASE NOTIFY THE MICROBIOLOGY DEPARTMENT WITHIN ONE WEEK IF SPECIATION AND SENSITIVITIES ARE REQUIRED. Performed at [facility], [place] [address], [place], [place] [address]    Report Status 12/14/2019 FINAL  Final  Culture, blood (routine x 2)     Status: None   Collection Time: 12/11/19  9:47 AM   Specimen: BLOOD  Result Value Ref Range Status   Specimen Description BLOOD RIGHT ANTECUBITAL  Final   Special Requests   Final    BOTTLES DRAWN AEROBIC AND ANAEROBIC Blood Culture adequate volume   Culture  Setup Time   Final    CORRECTED RESULTS NO ORGANISMS SEEN PREVIOUSLY REPORTED AS: GRAM POSITIVE COCCI IN CLUSTERS CORRECTED RESULTS CALLED TO: RN R [place] [id number] AT 920 AM BY CM    Culture   Final    NO GROWTH 5 DAYS Performed at [facility], [place] [address] [place], [place] [address]    Report Status 12/16/2019 FINAL  Final  Culture, respiratory (non-expectorated)     Status: None   Collection Time:  12/11/19 10:27 AM   Specimen: Tracheal Aspirate; Respiratory  Result Value Ref Range Status   Specimen Description TRACHEAL ASPIRATE  Final   Special Requests NONE  Final   Gram Stain NO WBC SEEN NO ORGANISMS SEEN   Final   Culture   Final    FEW PSEUDOMONAS AERUGINOSA FEW STENOTROPHOMONAS MALTOPHILIA SEE SEPARATE REPORT IN [facility] FOR DOS 12/11/19. Performed at [facility], [place] [address] [place], [place] [address]    Report Status 12/26/2019 FINAL  Final   Organism ID, Bacteria PSEUDOMONAS AERUGINOSA  Final   Organism ID, Bacteria STENOTROPHOMONAS MALTOPHILIA  Final      Susceptibility   Pseudomonas aeruginosa - MIC*    CEFTAZIDIME 16 INTERMEDIATE Intermediate     CIPROFLOXACIN >=4 RESISTANT Resistant     GENTAMICIN 2 SENSITIVE Sensitive     IMIPENEM >=16 RESISTANT Resistant     * FEW PSEUDOMONAS AERUGINOSA   Stenotrophomonas maltophilia - MIC*    LEVOFLOXACIN 0.5 SENSITIVE Sensitive     TRIMETH/SULFA <=20 SENSITIVE Sensitive     * FEW STENOTROPHOMONAS MALTOPHILIA  Culture, Urine     Status: Abnormal   Collection Time: 12/11/19 11:30 AM   Specimen: Urine, Random  Result Value Ref Range Status   Specimen Description URINE, RANDOM  Final   Special Requests NONE  Final   Culture (A)  Final    <10,000 COLONIES/mL INSIGNIFICANT GROWTH Performed at [facility], [place] [address], [place],  [address]    Report Status 12/12/2019 FINAL  Final  Culture, blood (routine x 2)     Status: None   Collection Time: 12/15/19 12:37 PM   Specimen: BLOOD LEFT HAND  Result Value Ref Range Status   Specimen Description BLOOD LEFT HAND  Final   Special Requests   Final    BOTTLES DRAWN AEROBIC AND ANAEROBIC Blood Culture  adequate volume   Culture   Final    NO GROWTH 5 DAYS Performed at [facility], [place] [address], [place], [place] [address]    Report Status 12/20/2019 FINAL  Final  Culture, blood (routine x 2)     Status: None   Collection Time: 12/15/19 12:42 PM    Specimen: BLOOD LEFT HAND  Result Value Ref Range Status   Specimen Description BLOOD LEFT HAND  Final   Special Requests   Final    BOTTLES DRAWN AEROBIC AND ANAEROBIC Blood Culture adequate volume   Culture   Final    NO GROWTH 5 DAYS Performed at [facility], [place] [address], [place], [place] [address]    Report Status 12/20/2019 FINAL  Final  C Difficile Quick Screen w PCR reflex     Status: None   Collection Time: 12/17/19  7:00 PM  Result Value Ref Range Status   C Diff antigen NEGATIVE NEGATIVE Final   C Diff toxin NEGATIVE NEGATIVE Final   C Diff interpretation No C. difficile detected.  Final    Comment: Performed at [facility], [place] [address] [place], [place] [address]  SARS CORONAVIRUS 2 (TAT 6-24 HRS) Nasopharyngeal Nasopharyngeal Swab     Status: None   Collection Time: 12/20/19 11:45 AM   Specimen: Nasopharyngeal Swab  Result Value Ref Range Status   SARS Coronavirus 2 NEGATIVE NEGATIVE Final    Comment: (NOTE) SARS-CoV-2 target nucleic acids are NOT DETECTED. The SARS-CoV-2 RNA is generally detectable in upper and lower respiratory specimens during the acute phase of infection. Negative results do not preclude SARS-CoV-2 infection, do not rule out co-infections with other pathogens, and should not be used as the sole basis for treatment or other patient management decisions. Negative results must be combined with clinical observations, patient history, and epidemiological information. The expected result is Negative. Fact Sheet for Patients: SugarRoll.be Fact Sheet for Healthcare Providers: https://www.woods-mathews.com/ This test is not yet approved or cleared by the [place] FDA and  has been authorized for detection and/or diagnosis of SARS-CoV-2 by FDA under an Emergency Use Authorization (EUA). This EUA will remain  in effect (meaning this test can be used) for the duration of the COVID-19  declaration under Section 56 4(b)(1) of the Act, 21 U.S.C. section 360bbb-3(b)(1), unless the authorization is terminated or revoked sooner. Performed at [facility], [place] [address], [place], [place] [address]   Body fluid culture     Status: None   Collection Time: 12/21/19 12:10 PM   Specimen: Lung, Left; Pleural Fluid  Result Value Ref Range Status   Specimen Description PLEURAL FLUID  Final   Special Requests LEFT LUNG THORA  Final   Gram Stain   Final    RARE WBC PRESENT, PREDOMINANTLY MONONUCLEAR NO ORGANISMS SEEN    Culture   Final    NO GROWTH 3 DAYS Performed at [facility], [place] [address] [place],  [address]    Report Status 12/24/2019 FINAL  Final  Culture, blood (routine x 2)     Status: None   Collection Time: 12/25/19  2:16 PM   Specimen: BLOOD RIGHT HAND  Result Value Ref Range Status   Specimen Description BLOOD RIGHT HAND  Final   Special Requests   Final    BOTTLES DRAWN AEROBIC ONLY Blood Culture adequate volume   Culture   Final    NO GROWTH 5 DAYS Performed at [facility], [address], [place], [place] [address]    Report Status 01/01/2020 FINAL  Final  Culture, blood (routine x 2)     Status: None   Collection Time: 12/25/19  2:18 PM   Specimen: BLOOD LEFT HAND  Result Value Ref Range Status   Specimen Description BLOOD LEFT HAND  Final   Special Requests   Final    BOTTLES DRAWN AEROBIC ONLY Blood Culture adequate volume   Culture   Final    NO GROWTH 5 DAYS Performed at [facility], [place] [address] [place] [address] [place], [place] [address]    Report Status 01/01/2020 FINAL  Final  Culture, respiratory (non-expectorated)     Status: None   Collection Time: 12/25/19  4:20 PM   Specimen: Tracheal Aspirate; Respiratory  Result Value Ref Range Status   Specimen Description TRACHEAL ASPIRATE  Final   Special Requests NONE  Final   Gram Stain   Final    ABUNDANT WBC PRESENT, PREDOMINANTLY PMN NO ORGANISMS SEEN    Culture    Final    RARE Consistent with normal respiratory flora. Performed at [facility], [place] [address] [place], [place] [address]    Report Status 12/28/2019 FINAL  Final    Coagulation Studies: No results for input(s): LABPROT, INR in the last 72 hours.  Urinalysis: No results for input(s): COLORURINE, LABSPEC, PHURINE, GLUCOSEU, HGBUR, BILIRUBINUR, KETONESUR, PROTEINUR, UROBILINOGEN, NITRITE, LEUKOCYTESUR in the last 72 hours.  Invalid input(s): APPERANCEUR    Imaging: DG CHEST PORT 1 VIEW  Result Date: 01/11/2020 CLINICAL DATA:  Increased secretions from tracheostomy.  Infiltrate. EXAM: PORTABLE CHEST 1 VIEW COMPARISON:  January 04, 2020 FINDINGS: Tracheostomy catheter tip is 5.3 cm above the carina. Central catheter tip is in the right atrium slightly beyond the cavoatrial junction. No pneumothorax. There is widespread airspace opacity throughout most of the left lung, slightly increased from most recent study. There is a superimposed right pleural effusion. There is ill-defined consolidation in the right base with minimal right pleural effusion. There is mild cardiomegaly with pulmonary venous hypertension. No adenopathy. There is aortic atherosclerosis. No bone lesions. IMPRESSION: Tube and catheter positions as described without pneumothorax. Multifocal airspace opacity consistent with multifocal pneumonia on the left with left pleural effusion. Patchy infiltrate in the right base with minimal right pleural effusion. Stable cardiac silhouette. Aortic Atherosclerosis (ICD10-I70.0). Electronically Signed   By: Lowella Grip III M.D.   On: 01/11/2020 14:33     Medications:     heparin sodium (porcine), lidocaine, lidocaine  Assessment/ Plan:  74 y.o. male with a PMHx of acute respiratory failure status post COVID-19 infection status post tracheostomy placement, atrial fibrillation, hypertension, CVA, gout, hypertrophic cardiomyopathy, who was admitted to [facility] on 11/21/2019 for ongoing treatment of acute respiratory failure.  1.  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  Patient seen and evaluated during dialysis treatment.  Tolerating well.  Complete dialysis treatment today.  Thereafter next dialysis treatment will be on Monday.  2.  Acute respiratory failure secondary to COVID-19 infection and its sequela.  -Patient remains ventilator dependent at this time.  FiO2 currently 40% with a PEEP of 5.  3.  Hyperkalemia.  Potassium currently 3.8 and acceptable.  4.  Hyponatremia.  Serum sodium normalized today at 136.  Continue to monitor.       LOS: 0 Dierdre Mccalip 4/16/20218:27 AM

## 2020-01-13 DIAGNOSIS — J9621 Acute and chronic respiratory failure with hypoxia: Secondary | ICD-10-CM | POA: Diagnosis not present

## 2020-01-13 DIAGNOSIS — N179 Acute kidney failure, unspecified: Secondary | ICD-10-CM | POA: Diagnosis not present

## 2020-01-13 DIAGNOSIS — U071 COVID-19: Secondary | ICD-10-CM | POA: Diagnosis not present

## 2020-01-13 DIAGNOSIS — I4891 Unspecified atrial fibrillation: Secondary | ICD-10-CM | POA: Diagnosis not present

## 2020-01-13 NOTE — Progress Notes (Signed)
Pulmonary Critical Care Medicine [place]   PULMONARY CRITICAL CARE SERVICE  PROGRESS NOTE  Date of Service: 01/13/2020  Rubin Dais  QIW:[id number]  DOB: [birth year]   DOA: 11/21/2019  Referring Physician: [name], MD  HPI: Madden Garron is a 74 y.o. male seen for follow up of Acute on Chronic Respiratory Failure.  Patient remains on the ventilator not tolerating any attempts at weaning right now is on assist control mode on 45% FiO2  Medications: Reviewed on Rounds  Physical Exam:  Vitals: Temperature 99.1 pulse 79 respiratory rate 20 blood pressure is 119/53 saturations 99%  Ventilator Settings on assist control FiO2 is 45% tidal volume is 351 PEEP 7  . General: Comfortable at this time . Eyes: Grossly normal lids, irises & conjunctiva . ENT: grossly tongue is normal . Neck: no obvious mass . Cardiovascular: S1 S2 normal no gallop . Respiratory: No rhonchi no rales are noted at this time . Abdomen: soft . Skin: no rash seen on limited exam . Musculoskeletal: not rigid . Psychiatric:unable to assess . Neurologic: no seizure no involuntary movements         Lab Data:   Basic Metabolic Panel: Recent Labs  Lab 01/08/20 0607 01/10/20 0604 01/12/20 0527  NA 135 134* 136  K 3.7 3.5 3.8  CL 96* 95* 96*  CO2 29 29 29   GLUCOSE 122* 138* 152*  BUN 91* 92* 88*  CREATININE 2.37* 2.04* 1.95*  CALCIUM 9.1 9.4 9.5  MG  --  1.7 2.2  PHOS 2.7 2.5 1.6*    ABG: No results for input(s): PHART, PCO2ART, PO2ART, HCO3, O2SAT in the last 168 hours.  Liver Function Tests: Recent Labs  Lab 01/08/20 0607 01/10/20 0604 01/12/20 0527  ALBUMIN 1.9* 1.9* 2.2*   No results for input(s): LIPASE, AMYLASE in the last 168 hours. No results for input(s): AMMONIA in the last 168 hours.  CBC: Recent Labs  Lab 01/08/20 0607 01/10/20 0604 01/12/20 0527  WBC 16.1* 16.7* 19.6*  HGB 7.4* 7.3* 7.3*  HCT 23.7* 23.3* 23.3*  MCV 85.6 86.9 86.0  PLT 292 235  224    Cardiac Enzymes: No results for input(s): CKTOTAL, CKMB, CKMBINDEX, TROPONINI in the last 168 hours.  BNP (last 3 results) No results for input(s): BNP in the last 8760 hours.  ProBNP (last 3 results) No results for input(s): PROBNP in the last 8760 hours.  Radiological Exams: No results found.  Assessment/Plan Active Problems:   Acute on chronic respiratory failure with hypoxia (HCC)   COVID-19 virus infection   Atrial fibrillation with RVR (HCC)   Severe sepsis (HCC)   Pneumonia due to COVID-19 virus   1. Acute on chronic respiratory failure with hypoxia we will continue with assist control titrate oxygen continue pulmonary toilet.  Patient has been failing the RSB I 2. COVID-19 virus infection resolved 3. Atrial fibrillation rate controlled 4. Severe sepsis hemodynamics are stable we will continue to monitor 5. Pneumonia due to COVID-19 treated we will continue to follow along   I have personally seen and evaluated the patient, evaluated laboratory and imaging results, formulated the assessment and plan and placed orders. The Patient requires high complexity decision making with multiple systems involvement.  Rounds were done with the Respiratory Therapy Director and Staff therapists and discussed with nursing staff also.  Allyne Gee, MD [facility] Pulmonary Critical Care Medicine Sleep Medicine

## 2020-01-14 DIAGNOSIS — N179 Acute kidney failure, unspecified: Secondary | ICD-10-CM | POA: Diagnosis not present

## 2020-01-14 DIAGNOSIS — J9621 Acute and chronic respiratory failure with hypoxia: Secondary | ICD-10-CM | POA: Diagnosis not present

## 2020-01-14 DIAGNOSIS — I4891 Unspecified atrial fibrillation: Secondary | ICD-10-CM | POA: Diagnosis not present

## 2020-01-14 DIAGNOSIS — U071 COVID-19: Secondary | ICD-10-CM | POA: Diagnosis not present

## 2020-01-14 NOTE — Progress Notes (Signed)
Pulmonary Critical Care Medicine [place]   PULMONARY CRITICAL CARE SERVICE  PROGRESS NOTE  Date of Service: 01/14/2020  [place]  URK:[id number]  DOB: 1945/10/19   DOA: 11/21/2019  Referring Physician: [name], MD  HPI: Shane Banks is a 74 y.o. male seen for follow up of Acute on Chronic Respiratory Failure.  Patient at this time is on the NAG has been on 16-hour goal with 6 L of O2  Medications: Reviewed on Rounds  Physical Exam:  Vitals: Temperature is 97.7 pulse 80 respiratory rate 33 blood pressure 151/77 saturations 99%  Ventilator Settings on the NAG on 16-hour goal  . General: Comfortable at this time . Eyes: Grossly normal lids, irises & conjunctiva . ENT: grossly tongue is normal . Neck: no obvious mass . Cardiovascular: S1 S2 normal no gallop . Respiratory: No rhonchi no rales are noted at this time . Abdomen: soft . Skin: no rash seen on limited exam . Musculoskeletal: not rigid . Psychiatric:unable to assess . Neurologic: no seizure no involuntary movements         Lab Data:   Basic Metabolic Panel: Recent Labs  Lab 01/08/20 0607 01/10/20 0604 01/12/20 0527  NA 135 134* 136  K 3.7 3.5 3.8  CL 96* 95* 96*  CO2 29 29 29   GLUCOSE 122* 138* 152*  BUN 91* 92* 88*  CREATININE 2.37* 2.04* 1.95*  CALCIUM 9.1 9.4 9.5  MG  --  1.7 2.2  PHOS 2.7 2.5 1.6*    ABG: No results for input(s): PHART, PCO2ART, PO2ART, HCO3, O2SAT in the last 168 hours.  Liver Function Tests: Recent Labs  Lab 01/08/20 0607 01/10/20 0604 01/12/20 0527  ALBUMIN 1.9* 1.9* 2.2*   No results for input(s): LIPASE, AMYLASE in the last 168 hours. No results for input(s): AMMONIA in the last 168 hours.  CBC: Recent Labs  Lab 01/08/20 0607 01/10/20 0604 01/12/20 0527  WBC 16.1* 16.7* 19.6*  HGB 7.4* 7.3* 7.3*  HCT 23.7* 23.3* 23.3*  MCV 85.6 86.9 86.0  PLT 292 235 224    Cardiac Enzymes: No results for input(s): CKTOTAL, CKMB, CKMBINDEX,  TROPONINI in the last 168 hours.  BNP (last 3 results) No results for input(s): BNP in the last 8760 hours.  ProBNP (last 3 results) No results for input(s): PROBNP in the last 8760 hours.  Radiological Exams: No results found.  Assessment/Plan Active Problems:   Acute on chronic respiratory failure with hypoxia (HCC)   COVID-19 virus infection   Atrial fibrillation with RVR (HCC)   Severe sepsis (HCC)   Pneumonia due to COVID-19 virus   1. Acute on chronic respiratory failure hypoxia continue with weaning on NAG and supplement with 6 L of oxygen.  Today's goal is 16 hours if patient does well we can try to go longer 2. COVID-19 virus infection in resolution phase we will continue with supportive care 3. Atrial fibrillation rate controlled 4. Severe sepsis hemodynamics are stable we will continue to follow 5. Pneumonia due to COVID-19 treated we will continue with present management   I have personally seen and evaluated the patient, evaluated laboratory and imaging results, formulated the assessment and plan and placed orders. The Patient requires high complexity decision making with multiple systems involvement.  Rounds were done with the Respiratory Therapy Director and Staff therapists and discussed with nursing staff also.  Allyne Gee, MD [facility] Pulmonary Critical Care Medicine Sleep Medicine

## 2020-01-15 DIAGNOSIS — J9621 Acute and chronic respiratory failure with hypoxia: Secondary | ICD-10-CM | POA: Diagnosis not present

## 2020-01-15 DIAGNOSIS — N179 Acute kidney failure, unspecified: Secondary | ICD-10-CM | POA: Diagnosis not present

## 2020-01-15 DIAGNOSIS — I4891 Unspecified atrial fibrillation: Secondary | ICD-10-CM | POA: Diagnosis not present

## 2020-01-15 DIAGNOSIS — U071 COVID-19: Secondary | ICD-10-CM | POA: Diagnosis not present

## 2020-01-15 LAB — RENAL FUNCTION PANEL
Albumin: 2 g/dL — ABNORMAL LOW (ref 3.5–5.0)
Anion gap: 10 (ref 5–15)
BUN: 103 mg/dL — ABNORMAL HIGH (ref 8–23)
CO2: 31 mmol/L (ref 22–32)
Calcium: 9.5 mg/dL (ref 8.9–10.3)
Chloride: 96 mmol/L — ABNORMAL LOW (ref 98–111)
Creatinine, Ser: 2.24 mg/dL — ABNORMAL HIGH (ref 0.61–1.24)
GFR calc Af Amer: 33 mL/min — ABNORMAL LOW (ref >60)
GFR calc non Af Amer: 28 mL/min — ABNORMAL LOW (ref >60)
Glucose, Bld: 159 mg/dL — ABNORMAL HIGH (ref 70–99)
Phosphorus: 2.2 mg/dL — ABNORMAL LOW (ref 2.5–4.6)
Potassium: 3.4 mmol/L — ABNORMAL LOW (ref 3.5–5.1)
Sodium: 137 mmol/L (ref 135–145)

## 2020-01-15 LAB — PREPARE RBC (CROSSMATCH)

## 2020-01-15 LAB — CBC
HCT: 22.3 % — ABNORMAL LOW (ref 39.0–52.0)
Hemoglobin: 7 g/dL — ABNORMAL LOW (ref 13.0–17.0)
MCH: 27.3 pg (ref 26.0–34.0)
MCHC: 31.4 g/dL (ref 30.0–36.0)
MCV: 87.1 fL (ref 80.0–100.0)
Platelets: 259 10*3/uL (ref 150–400)
RBC: 2.56 MIL/uL — ABNORMAL LOW (ref 4.22–5.81)
RDW: 18.7 % — ABNORMAL HIGH (ref 11.5–15.5)
WBC: 20.6 10*3/uL — ABNORMAL HIGH (ref 4.0–10.5)
nRBC: 0.1 % (ref 0.0–0.2)

## 2020-01-15 LAB — BLOOD GAS, ARTERIAL
Acid-Base Excess: 4.7 mmol/L — ABNORMAL HIGH (ref 0.0–2.0)
Bicarbonate: 30.9 mmol/L — ABNORMAL HIGH (ref 20.0–28.0)
FIO2: 36
O2 Saturation: 99.4 %
Patient temperature: 36.4
pCO2 arterial: 64.9 mmHg — ABNORMAL HIGH (ref 32.0–48.0)
pH, Arterial: 7.296 — ABNORMAL LOW (ref 7.350–7.450)
pO2, Arterial: 167 mmHg — ABNORMAL HIGH (ref 83.0–108.0)

## 2020-01-15 LAB — MAGNESIUM: Magnesium: 1.7 mg/dL (ref 1.7–2.4)

## 2020-01-15 LAB — HEPATITIS B SURFACE ANTIGEN: Hepatitis B Surface Ag: NONREACTIVE

## 2020-01-15 NOTE — Progress Notes (Signed)
Pulmonary Critical Care Medicine [place]   PULMONARY CRITICAL CARE SERVICE  PROGRESS NOTE  Date of Service: 01/15/2020  Shane Banks  LHT:[id number]  DOB: Mar 23, 1946   DOA: 11/21/2019  Referring Physician: [name], MD  HPI: Shane Banks is a 74 y.o. male seen for follow up of Acute on Chronic Respiratory Failure.  Patient currently is on the NAG has been on 4 L of oxygen.  Today will be 24 hours patient now is end-stage renal disease per nephrology  Medications: Reviewed on Rounds  Physical Exam:  Vitals: Temperature 97.2 pulse 86 respiratory rate 30 blood pressure is 116/64 saturations 98%  Ventilator Settings off the ventilator on NAG requiring 4 L  . General: Comfortable at this time . Eyes: Grossly normal lids, irises & conjunctiva . ENT: grossly tongue is normal . Neck: no obvious mass . Cardiovascular: S1 S2 normal no gallop . Respiratory: No rhonchi coarse breath sounds . Abdomen: soft . Skin: no rash seen on limited exam . Musculoskeletal: not rigid . Psychiatric:unable to assess . Neurologic: no seizure no involuntary movements         Lab Data:   Basic Metabolic Panel: Recent Labs  Lab 01/10/20 0604 01/12/20 0527 01/15/20 0706  NA 134* 136 137  K 3.5 3.8 3.4*  CL 95* 96* 96*  CO2 29 29 31   GLUCOSE 138* 152* 159*  BUN 92* 88* 103*  CREATININE 2.04* 1.95* 2.24*  CALCIUM 9.4 9.5 9.5  MG 1.7 2.2 1.7  PHOS 2.5 1.6* 2.2*    ABG: No results for input(s): PHART, PCO2ART, PO2ART, HCO3, O2SAT in the last 168 hours.  Liver Function Tests: Recent Labs  Lab 01/10/20 0604 01/12/20 0527 01/15/20 0706  ALBUMIN 1.9* 2.2* 2.0*   No results for input(s): LIPASE, AMYLASE in the last 168 hours. No results for input(s): AMMONIA in the last 168 hours.  CBC: Recent Labs  Lab 01/10/20 0604 01/12/20 0527 01/15/20 0706  WBC 16.7* 19.6* 20.6*  HGB 7.3* 7.3* 7.0*  HCT 23.3* 23.3* 22.3*  MCV 86.9 86.0 87.1  PLT 235 224 259     Cardiac Enzymes: No results for input(s): CKTOTAL, CKMB, CKMBINDEX, TROPONINI in the last 168 hours.  BNP (last 3 results) No results for input(s): BNP in the last 8760 hours.  ProBNP (last 3 results) No results for input(s): PROBNP in the last 8760 hours.  Radiological Exams: No results found.  Assessment/Plan Active Problems:   Acute on chronic respiratory failure with hypoxia (HCC)   COVID-19 virus infection   Atrial fibrillation with RVR (HCC)   Severe sepsis (HCC)   Pneumonia due to COVID-19 virus   1. Acute on chronic respiratory failure hypoxia patient is liberated from the ventilator today for 24 hours: 2. COVID-19 virus infection resolved 3. Atrial fibrillation rate controlled 4. Severe sepsis resolved hemodynamics are stable 5. Pneumonia due to COVID-19 treated we will continue to follow along   I have personally seen and evaluated the patient, evaluated laboratory and imaging results, formulated the assessment and plan and placed orders. The Patient requires high complexity decision making with multiple systems involvement.  Rounds were done with the Respiratory Therapy Director and Staff therapists and discussed with nursing staff also.  Allyne Gee, MD [facility] Pulmonary Critical Care Medicine Sleep Medicine

## 2020-01-15 NOTE — Progress Notes (Signed)
Let me know [facility]  ROUNDING NOTE   Subjective:  Pt seen during HD.  UF target increased to 3kg.   Objective:  Vital signs in last 24 hours:  Temperature 97.2 pulse 86 respirations 34 blood pressure 116/64  Physical Exam: General: Critically ill-appearing  Head: Normocephalic, atraumatic. Moist oral mucosal membranes  Eyes: Anicteric  Neck: Tracheostomy in place  Lungs:  Scattered rhonchi, vent assisted, tachypneic  Heart: S1S2 no rubs  Abdomen:  Soft, nontender, bowel sounds present, PEG tube in place  Extremities: 3+ peripheral edema, left upper extremity wrapped  Neurologic: Awake, alert, will follow simple commands  Skin: No rash  Access: Right IJ temporary dialysis catheter    Basic Metabolic Panel: Recent Labs  Lab 01/10/20 0604 01/12/20 0527  NA 134* 136  K 3.5 3.8  CL 95* 96*  CO2 29 29  GLUCOSE 138* 152*  BUN 92* 88*  CREATININE 2.04* 1.95*  CALCIUM 9.4 9.5  MG 1.7 2.2  PHOS 2.5 1.6*    Liver Function Tests: Recent Labs  Lab 01/10/20 0604 01/12/20 0527  ALBUMIN 1.9* 2.2*   No results for input(s): LIPASE, AMYLASE in the last 168 hours. No results for input(s): AMMONIA in the last 168 hours.  CBC: Recent Labs  Lab 01/10/20 0604 01/12/20 0527 01/15/20 0706  WBC 16.7* 19.6* 20.6*  HGB 7.3* 7.3* 7.0*  HCT 23.3* 23.3* 22.3*  MCV 86.9 86.0 87.1  PLT 235 224 259    Cardiac Enzymes: No results for input(s): CKTOTAL, CKMB, CKMBINDEX, TROPONINI in the last 168 hours.  BNP: Invalid input(s): POCBNP  CBG: No results for input(s): GLUCAP in the last 168 hours.  Microbiology: Results for orders placed or performed during the hospital encounter of 11/21/19  Culture, Urine     Status: Abnormal   Collection Time: 11/22/19  1:25 PM   Specimen: Urine, Random  Result Value Ref Range Status   Specimen Description URINE, RANDOM  Final   Special Requests   Final    NONE Performed at [facility], [address],  [place], [place] [address]    Culture 50,000 COLONIES/mL YEAST (A)  Final   Report Status 11/23/2019 FINAL  Final  Culture, blood (routine x 2)     Status: None   Collection Time: 11/22/19  1:40 PM   Specimen: BLOOD LEFT HAND  Result Value Ref Range Status   Specimen Description BLOOD LEFT HAND  Final   Special Requests   Final    BOTTLES DRAWN AEROBIC ONLY Blood Culture results may not be optimal due to an inadequate volume of blood received in culture bottles   Culture   Final    NO GROWTH 5 DAYS Performed at [facility], [place] [address] [place], [place] [address]    Report Status 11/27/2019 FINAL  Final  Culture, blood (routine x 2)     Status: None   Collection Time: 11/22/19  1:44 PM   Specimen: BLOOD LEFT HAND  Result Value Ref Range Status   Specimen Description BLOOD LEFT HAND  Final   Special Requests   Final    BOTTLES DRAWN AEROBIC ONLY Blood Culture results may not be optimal due to an inadequate volume of blood received in culture bottles   Culture   Final    NO GROWTH 5 DAYS Performed at [facility], [place] [address], [place], [place] [address]    Report Status 11/27/2019 FINAL  Final  Culture, respiratory (non-expectorated)  Status: None   Collection Time: 11/22/19  4:59 PM   Specimen: Tracheal Aspirate; Respiratory  Result Value Ref Range Status   Specimen Description TRACHEAL ASPIRATE  Final   Special Requests NONE  Final   Gram Stain   Final    ABUNDANT WBC PRESENT, PREDOMINANTLY PMN NO ORGANISMS SEEN    Culture   Final    NO GROWTH 2 DAYS Performed at [facility], [address], [place], [place] [address]    Report Status 11/24/2019 FINAL  Final  Culture, respiratory (non-expectorated)     Status: None   Collection Time: 12/04/19  2:22 AM   Specimen: Tracheal Aspirate; Respiratory  Result Value Ref Range Status   Specimen Description TRACHEAL ASPIRATE  Final   Special Requests NONE  Final   Gram Stain   Final    ABUNDANT WBC  PRESENT, PREDOMINANTLY PMN ABUNDANT GRAM NEGATIVE RODS Performed at [facility], [address] [place], [place] [address]    Culture   Final    MODERATE PSEUDOMONAS AERUGINOSA FEW ESCHERICHIA COLI Confirmed Extended Spectrum Beta-Lactamase Producer (ESBL).  In bloodstream infections from ESBL organisms, carbapenems are preferred over piperacillin/tazobactam. They are shown to have a lower risk of mortality.    Report Status 12/09/2019 FINAL  Final   Organism ID, Bacteria PSEUDOMONAS AERUGINOSA  Final   Organism ID, Bacteria ESCHERICHIA COLI  Final      Susceptibility   Escherichia coli - MIC*    AMPICILLIN >=32 RESISTANT Resistant     CEFAZOLIN >=64 RESISTANT Resistant     CEFEPIME 2 SENSITIVE Sensitive     CEFTAZIDIME RESISTANT Resistant     CEFTRIAXONE >=64 RESISTANT Resistant     CIPROFLOXACIN <=0.25 SENSITIVE Sensitive     GENTAMICIN >=16 RESISTANT Resistant     IMIPENEM <=0.25 SENSITIVE Sensitive     TRIMETH/SULFA >=320 RESISTANT Resistant     AMPICILLIN/SULBACTAM 16 INTERMEDIATE Intermediate     PIP/TAZO <=4 SENSITIVE Sensitive     * FEW ESCHERICHIA COLI   Pseudomonas aeruginosa - MIC*    CEFTAZIDIME 16 INTERMEDIATE Intermediate     CIPROFLOXACIN >=4 RESISTANT Resistant     GENTAMICIN 2 SENSITIVE Sensitive     IMIPENEM >=16 RESISTANT Resistant     * MODERATE PSEUDOMONAS AERUGINOSA  Culture, Urine     Status: Abnormal   Collection Time: 12/04/19  6:33 PM   Specimen: Urine, Random  Result Value Ref Range Status   Specimen Description URINE, RANDOM  Final   Special Requests   Final    NONE Performed at [facility], [address], [place], [place] [address]    Culture >=100,000 COLONIES/mL PSEUDOMONAS AERUGINOSA (A)  Final   Report Status 12/06/2019 FINAL  Final   Organism ID, Bacteria PSEUDOMONAS AERUGINOSA (A)  Final      Susceptibility   Pseudomonas aeruginosa - MIC*    CEFTAZIDIME 4 SENSITIVE Sensitive     CIPROFLOXACIN >=4 RESISTANT Resistant      GENTAMICIN 4 SENSITIVE Sensitive     IMIPENEM 2 SENSITIVE Sensitive     PIP/TAZO 16 SENSITIVE Sensitive     * >=100,000 COLONIES/mL PSEUDOMONAS AERUGINOSA  Culture, blood (routine x 2)     Status: Abnormal   Collection Time: 12/11/19  9:39 AM   Specimen: BLOOD LEFT HAND  Result Value Ref Range Status   Specimen Description BLOOD LEFT HAND  Final   Special Requests   Final    BOTTLES DRAWN AEROBIC AND ANAEROBIC Blood Culture adequate  volume   Culture  Setup Time   Final    GRAM POSITIVE COCCI IN CLUSTERS ANAEROBIC BOTTLE ONLY CRITICAL RESULT CALLED TO, READ BACK BY AND VERIFIED WITH: RN R [place] [id number] AT 59 BY CM    Culture (A)  Final    STAPHYLOCOCCUS SPECIES (COAGULASE NEGATIVE) THE SIGNIFICANCE OF ISOLATING THIS ORGANISM FROM A SINGLE SET OF BLOOD CULTURES WHEN MULTIPLE SETS ARE DRAWN IS UNCERTAIN. PLEASE NOTIFY THE MICROBIOLOGY DEPARTMENT WITHIN ONE WEEK IF SPECIATION AND SENSITIVITIES ARE REQUIRED. Performed at [facility], [place] [address] [place], [place] [address]    Report Status 12/14/2019 FINAL  Final  Culture, blood (routine x 2)     Status: None   Collection Time: 12/11/19  9:47 AM   Specimen: BLOOD  Result Value Ref Range Status   Specimen Description BLOOD RIGHT ANTECUBITAL  Final   Special Requests   Final    BOTTLES DRAWN AEROBIC AND ANAEROBIC Blood Culture adequate volume   Culture  Setup Time   Final    CORRECTED RESULTS NO ORGANISMS SEEN PREVIOUSLY REPORTED AS: GRAM POSITIVE COCCI IN CLUSTERS CORRECTED RESULTS CALLED TO: RN R [place] [id number] AT 920 AM BY CM    Culture   Final    NO GROWTH 5 DAYS Performed at [facility], [place] [address], [place], [place] [address]    Report Status 12/16/2019 FINAL  Final  Culture, respiratory (non-expectorated)     Status: None   Collection Time: 12/11/19 10:27 AM   Specimen: Tracheal Aspirate; Respiratory  Result Value Ref Range Status   Specimen Description TRACHEAL ASPIRATE  Final   Special  Requests NONE  Final   Gram Stain NO WBC SEEN NO ORGANISMS SEEN   Final   Culture   Final    FEW PSEUDOMONAS AERUGINOSA FEW STENOTROPHOMONAS MALTOPHILIA SEE SEPARATE REPORT IN [facility] FOR DOS 12/11/19. Performed at [facility], [place] [address] [place], [place] [address]    Report Status 12/26/2019 FINAL  Final   Organism ID, Bacteria PSEUDOMONAS AERUGINOSA  Final   Organism ID, Bacteria STENOTROPHOMONAS MALTOPHILIA  Final      Susceptibility   Pseudomonas aeruginosa - MIC*    CEFTAZIDIME 16 INTERMEDIATE Intermediate     CIPROFLOXACIN >=4 RESISTANT Resistant     GENTAMICIN 2 SENSITIVE Sensitive     IMIPENEM >=16 RESISTANT Resistant     * FEW PSEUDOMONAS AERUGINOSA   Stenotrophomonas maltophilia - MIC*    LEVOFLOXACIN 0.5 SENSITIVE Sensitive     TRIMETH/SULFA <=20 SENSITIVE Sensitive     * FEW STENOTROPHOMONAS MALTOPHILIA  Culture, Urine     Status: Abnormal   Collection Time: 12/11/19 11:30 AM   Specimen: Urine, Random  Result Value Ref Range Status   Specimen Description URINE, RANDOM  Final   Special Requests NONE  Final   Culture (A)  Final    <10,000 COLONIES/mL INSIGNIFICANT GROWTH Performed at [facility], [place] [address] [place], [place] [address]    Report Status 12/12/2019 FINAL  Final  Culture, blood (routine x 2)     Status: None   Collection Time: 12/15/19 12:37 PM   Specimen: BLOOD LEFT HAND  Result Value Ref Range Status   Specimen Description BLOOD LEFT HAND  Final   Special Requests   Final    BOTTLES DRAWN AEROBIC AND ANAEROBIC Blood Culture adequate volume   Culture   Final    NO GROWTH 5 DAYS Performed at [facility], [place]  [address] [place], [place] [address]    Report Status 12/20/2019 FINAL  Final  Culture, blood (routine x 2)     Status: None   Collection Time: 12/15/19 12:42 PM   Specimen: BLOOD LEFT HAND  Result Value Ref Range Status   Specimen Description BLOOD LEFT HAND  Final   Special Requests   Final    BOTTLES DRAWN  AEROBIC AND ANAEROBIC Blood Culture adequate volume   Culture   Final    NO GROWTH 5 DAYS Performed at [facility], [place] [address], [place], [place] [address]    Report Status 12/20/2019 FINAL  Final  C Difficile Quick Screen w PCR reflex     Status: None   Collection Time: 12/17/19  7:00 PM  Result Value Ref Range Status   C Diff antigen NEGATIVE NEGATIVE Final   C Diff toxin NEGATIVE NEGATIVE Final   C Diff interpretation No C. difficile detected.  Final    Comment: Performed at [facility], [place] [address], [place], [place] [address]  SARS CORONAVIRUS 2 (TAT 6-24 HRS) Nasopharyngeal Nasopharyngeal Swab     Status: None   Collection Time: 12/20/19 11:45 AM   Specimen: Nasopharyngeal Swab  Result Value Ref Range Status   SARS Coronavirus 2 NEGATIVE NEGATIVE Final    Comment: (NOTE) SARS-CoV-2 target nucleic acids are NOT DETECTED. The SARS-CoV-2 RNA is generally detectable in upper and lower respiratory specimens during the acute phase of infection. Negative results do not preclude SARS-CoV-2 infection, do not rule out co-infections with other pathogens, and should not be used as the sole basis for treatment or other patient management decisions. Negative results must be combined with clinical observations, patient history, and epidemiological information. The expected result is Negative. Fact Sheet for Patients: SugarRoll.be Fact Sheet for Healthcare Providers: https://www.woods-mathews.com/ This test is not yet approved or cleared by the [place] FDA and  has been authorized for detection and/or diagnosis of SARS-CoV-2 by FDA under an Emergency Use Authorization (EUA). This EUA will remain  in effect (meaning this test can be used) for the duration of the COVID-19 declaration under Section 56 4(b)(1) of the Act, 21 U.S.C. section 360bbb-3(b)(1), unless the authorization is terminated or revoked sooner. Performed at  [facility], [place] [address] [place], [place] [address]   Body fluid culture     Status: None   Collection Time: 12/21/19 12:10 PM   Specimen: Lung, Left; Pleural Fluid  Result Value Ref Range Status   Specimen Description PLEURAL FLUID  Final   Special Requests LEFT LUNG THORA  Final   Gram Stain   Final    RARE WBC PRESENT, PREDOMINANTLY MONONUCLEAR NO ORGANISMS SEEN    Culture   Final    NO GROWTH 3 DAYS Performed at [facility], [place] [address] [place], [place] [address]    Report Status 12/24/2019 FINAL  Final  Culture, blood (routine x 2)     Status: None   Collection Time: 12/25/19  2:16 PM   Specimen: BLOOD RIGHT HAND  Result Value Ref Range Status   Specimen Description BLOOD RIGHT HAND  Final   Special Requests   Final    BOTTLES DRAWN AEROBIC ONLY Blood Culture adequate volume   Culture   Final    NO GROWTH 5 DAYS Performed at [facility], [place] [address], [place],  [address]    Report Status 01/01/2020 FINAL  Final  Culture, blood (routine x 2)  Status: None   Collection Time: 12/25/19  2:18 PM   Specimen: BLOOD LEFT HAND  Result Value Ref Range Status   Specimen Description BLOOD LEFT HAND  Final   Special Requests   Final    BOTTLES DRAWN AEROBIC ONLY Blood Culture adequate volume   Culture   Final    NO GROWTH 5 DAYS Performed at [facility], [address], [place], [place] [address]    Report Status 01/01/2020 FINAL  Final  Culture, respiratory (non-expectorated)     Status: None   Collection Time: 12/25/19  4:20 PM   Specimen: Tracheal Aspirate; Respiratory  Result Value Ref Range Status   Specimen Description TRACHEAL ASPIRATE  Final   Special Requests NONE  Final   Gram Stain   Final    ABUNDANT WBC PRESENT, PREDOMINANTLY PMN NO ORGANISMS SEEN    Culture   Final    RARE Consistent with normal respiratory flora. Performed at [facility], [place] [address] [place], [place] [address]    Report Status  12/28/2019 FINAL  Final  Culture, respiratory (non-expectorated)     Status: None (Preliminary result)   Collection Time: 01/12/20  4:14 PM   Specimen: Tracheal Aspirate; Respiratory  Result Value Ref Range Status   Specimen Description TRACHEAL ASPIRATE  Final   Special Requests NONE  Final   Gram Stain   Final    ABUNDANT WBC PRESENT, PREDOMINANTLY PMN ABUNDANT GRAM NEGATIVE RODS RARE GRAM POSITIVE RODS    Culture   Final    ABUNDANT PSEUDOMONAS AERUGINOSA SUSCEPTIBILITIES TO FOLLOW CULTURE REINCUBATED FOR BETTER GROWTH Performed at [facility], [place] [address] [place], [place] [address]    Report Status PENDING  Incomplete    Coagulation Studies: No results for input(s): LABPROT, INR in the last 72 hours.  Urinalysis: No results for input(s): COLORURINE, LABSPEC, PHURINE, GLUCOSEU, HGBUR, BILIRUBINUR, KETONESUR, PROTEINUR, UROBILINOGEN, NITRITE, LEUKOCYTESUR in the last 72 hours.  Invalid input(s): APPERANCEUR    Imaging: No results found.   Medications:     heparin sodium (porcine), lidocaine, lidocaine  Assessment/ Plan:  74 y.o. male with a PMHx of acute respiratory failure status post COVID-19 infection status post tracheostomy placement, atrial fibrillation, hypertension, CVA, gout, hypertrophic cardiomyopathy, who was admitted to [facility] on 11/21/2019 for ongoing treatment of acute respiratory failure.  1.  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  Patient remains dialysis dependent at this time.  Therefore patient now considered ESRD.  Seen and evaluated during dialysis treatment.  Ultrafiltration target increased to 3 kg.  2.  Acute respiratory failure secondary to COVID-19 infection and its sequela.  -Breathing does appear to be a bit labored today.  Increase ultrafiltration target as above.  3.  Hyperkalemia.  Repeat serum potassium today.  4.  Hyponatremia.  Awaiting repeat serum sodium  today.       LOS: 0 Marlina Cataldi 4/19/20218:19 AM

## 2020-01-16 DIAGNOSIS — U071 COVID-19: Secondary | ICD-10-CM | POA: Diagnosis not present

## 2020-01-16 DIAGNOSIS — J9621 Acute and chronic respiratory failure with hypoxia: Secondary | ICD-10-CM | POA: Diagnosis not present

## 2020-01-16 DIAGNOSIS — N179 Acute kidney failure, unspecified: Secondary | ICD-10-CM | POA: Diagnosis not present

## 2020-01-16 DIAGNOSIS — I4891 Unspecified atrial fibrillation: Secondary | ICD-10-CM | POA: Diagnosis not present

## 2020-01-16 LAB — POTASSIUM: Potassium: 4 mmol/L (ref 3.5–5.1)

## 2020-01-16 LAB — BPAM RBC
Blood Product Expiration Date: 202105142359
ISSUE DATE / TIME: 202104191336
Unit Type and Rh: 5100

## 2020-01-16 LAB — TYPE AND SCREEN
ABO/RH(D): O POS
Antibody Screen: NEGATIVE
Unit division: 0

## 2020-01-16 LAB — MAGNESIUM: Magnesium: 2.1 mg/dL (ref 1.7–2.4)

## 2020-01-16 LAB — CULTURE, RESPIRATORY W GRAM STAIN

## 2020-01-16 LAB — CBC
HCT: 25.3 % — ABNORMAL LOW (ref 39.0–52.0)
Hemoglobin: 8.1 g/dL — ABNORMAL LOW (ref 13.0–17.0)
MCH: 27.2 pg (ref 26.0–34.0)
MCHC: 32 g/dL (ref 30.0–36.0)
MCV: 84.9 fL (ref 80.0–100.0)
Platelets: 231 10*3/uL (ref 150–400)
RBC: 2.98 MIL/uL — ABNORMAL LOW (ref 4.22–5.81)
RDW: 18.2 % — ABNORMAL HIGH (ref 11.5–15.5)
WBC: 24.2 10*3/uL — ABNORMAL HIGH (ref 4.0–10.5)
nRBC: 0 % (ref 0.0–0.2)

## 2020-01-16 NOTE — Progress Notes (Signed)
Pulmonary Critical Care Medicine [place]   PULMONARY CRITICAL CARE SERVICE  PROGRESS NOTE  Date of Service: 01/16/2020  Shane Banks  QPR:[id number]  DOB: Dec 08, 1945   DOA: 11/21/2019  Referring Physician: [name], MD  HPI: Shane Banks is a 74 y.o. male seen for follow up of Acute on Chronic Respiratory Failure.  Patient currently is on pressure support has been on 45% FiO2 attempted on the NAG did not tolerate it  Medications: Reviewed on Rounds  Physical Exam:  Vitals: Temperature 97.0 pulse 89 respiratory 22 blood pressure is 166/66 saturations 100%  Ventilator Settings pressure support FiO2 40% tidal volume 350 pressure support 12 PEEP 7  . General: Comfortable at this time . Eyes: Grossly normal lids, irises & conjunctiva . ENT: grossly tongue is normal . Neck: no obvious mass . Cardiovascular: S1 S2 normal no gallop . Respiratory: Scattered rhonchi expansion is equal . Abdomen: soft . Skin: no rash seen on limited exam . Musculoskeletal: not rigid . Psychiatric:unable to assess . Neurologic: no seizure no involuntary movements         Lab Data:   Basic Metabolic Panel: Recent Labs  Lab 01/10/20 0604 01/12/20 0527 01/15/20 0706 01/16/20 0547  NA 134* 136 137  --   K 3.5 3.8 3.4* 4.0  CL 95* 96* 96*  --   CO2 29 29 31   --   GLUCOSE 138* 152* 159*  --   BUN 92* 88* 103*  --   CREATININE 2.04* 1.95* 2.24*  --   CALCIUM 9.4 9.5 9.5  --   MG 1.7 2.2 1.7 2.1  PHOS 2.5 1.6* 2.2*  --     ABG: Recent Labs  Lab 01/15/20 1140  PHART 7.296*  PCO2ART 64.9*  PO2ART 167*  HCO3 30.9*  O2SAT 99.4    Liver Function Tests: Recent Labs  Lab 01/10/20 0604 01/12/20 0527 01/15/20 0706  ALBUMIN 1.9* 2.2* 2.0*   No results for input(s): LIPASE, AMYLASE in the last 168 hours. No results for input(s): AMMONIA in the last 168 hours.  CBC: Recent Labs  Lab 01/10/20 0604 01/12/20 0527 01/15/20 0706 01/16/20 0547  WBC 16.7* 19.6*  20.6* 24.2*  HGB 7.3* 7.3* 7.0* 8.1*  HCT 23.3* 23.3* 22.3* 25.3*  MCV 86.9 86.0 87.1 84.9  PLT 235 224 259 231    Cardiac Enzymes: No results for input(s): CKTOTAL, CKMB, CKMBINDEX, TROPONINI in the last 168 hours.  BNP (last 3 results) No results for input(s): BNP in the last 8760 hours.  ProBNP (last 3 results) No results for input(s): PROBNP in the last 8760 hours.  Radiological Exams: No results found.  Assessment/Plan Active Problems:   Acute on chronic respiratory failure with hypoxia (HCC)   COVID-19 virus infection   Atrial fibrillation with RVR (HCC)   Severe sepsis (HCC)   Pneumonia due to COVID-19 virus   1. Acute on chronic respiratory failure with hypoxia plan is to wean as tolerated on pressure support try the NAG again 2. COVID-19 virus infection treated now is in resolution phase 3. Atrial fibrillation rate controlled medical management 4. Severe sepsis resolved hemodynamics are stable 5. Pneumonia due to COVID-19 treated we will continue to follow   I have personally seen and evaluated the patient, evaluated laboratory and imaging results, formulated the assessment and plan and placed orders. The Patient requires high complexity decision making with multiple systems involvement.  Rounds were done with the Respiratory Therapy Director and Staff therapists and discussed with nursing staff  also.  Allyne Gee, MD [facility] Pulmonary Critical Care Medicine Sleep Medicine

## 2020-01-17 DIAGNOSIS — I4891 Unspecified atrial fibrillation: Secondary | ICD-10-CM | POA: Diagnosis not present

## 2020-01-17 DIAGNOSIS — J9621 Acute and chronic respiratory failure with hypoxia: Secondary | ICD-10-CM | POA: Diagnosis not present

## 2020-01-17 DIAGNOSIS — N179 Acute kidney failure, unspecified: Secondary | ICD-10-CM | POA: Diagnosis not present

## 2020-01-17 DIAGNOSIS — U071 COVID-19: Secondary | ICD-10-CM | POA: Diagnosis not present

## 2020-01-17 LAB — CBC
HCT: 23.8 % — ABNORMAL LOW (ref 39.0–52.0)
Hemoglobin: 7.4 g/dL — ABNORMAL LOW (ref 13.0–17.0)
MCH: 26.7 pg (ref 26.0–34.0)
MCHC: 31.1 g/dL (ref 30.0–36.0)
MCV: 85.9 fL (ref 80.0–100.0)
Platelets: 227 10*3/uL (ref 150–400)
RBC: 2.77 MIL/uL — ABNORMAL LOW (ref 4.22–5.81)
RDW: 18 % — ABNORMAL HIGH (ref 11.5–15.5)
WBC: 20.3 10*3/uL — ABNORMAL HIGH (ref 4.0–10.5)
nRBC: 0 % (ref 0.0–0.2)

## 2020-01-17 LAB — RENAL FUNCTION PANEL
Albumin: 2.3 g/dL — ABNORMAL LOW (ref 3.5–5.0)
Anion gap: 9 (ref 5–15)
BUN: 101 mg/dL — ABNORMAL HIGH (ref 8–23)
CO2: 29 mmol/L (ref 22–32)
Calcium: 9.4 mg/dL (ref 8.9–10.3)
Chloride: 97 mmol/L — ABNORMAL LOW (ref 98–111)
Creatinine, Ser: 2.01 mg/dL — ABNORMAL HIGH (ref 0.61–1.24)
GFR calc Af Amer: 37 mL/min — ABNORMAL LOW (ref >60)
GFR calc non Af Amer: 32 mL/min — ABNORMAL LOW (ref >60)
Glucose, Bld: 146 mg/dL — ABNORMAL HIGH (ref 70–99)
Phosphorus: 1.6 mg/dL — ABNORMAL LOW (ref 2.5–4.6)
Potassium: 3.6 mmol/L (ref 3.5–5.1)
Sodium: 135 mmol/L (ref 135–145)

## 2020-01-17 LAB — MAGNESIUM: Magnesium: 1.9 mg/dL (ref 1.7–2.4)

## 2020-01-17 NOTE — Progress Notes (Addendum)
Pulmonary Critical Care Medicine [place]   PULMONARY CRITICAL CARE SERVICE  PROGRESS NOTE  Date of Service: 01/17/2020  Shane Banks  ZSW:[id number]  DOB: 1946/09/26   DOA: 11/21/2019  Referring Physician: [name], MD  HPI: Shane Banks is a 74 y.o. male seen for follow up of Acute on Chronic Respiratory Failure.  Patient continues on full support on the ventilator at this time with a rate of 18 and FiO2 of 45% satting well with no distress.  Medications: Reviewed on Rounds  Physical Exam:  Vitals: Pulse 64 respirations 30 BP 146/74 O2 sat 99% temp 98 point  Ventilator Settings ventilator mode AC VC rate of 18 tidal volume 350 PEEP of 5 and FiO2 of 45%  . General: Comfortable at this time . Eyes: Grossly normal lids, irises & conjunctiva . ENT: grossly tongue is normal . Neck: no obvious mass . Cardiovascular: S1 S2 normal no gallop . Respiratory: No rales or rhonchi noted . Abdomen: soft . Skin: no rash seen on limited exam . Musculoskeletal: not rigid . Psychiatric:unable to assess . Neurologic: no seizure no involuntary movements         Lab Data:   Basic Metabolic Panel: Recent Labs  Lab 01/12/20 0527 01/15/20 0706 01/16/20 0547 01/17/20 0500  NA 136 137  --  135  K 3.8 3.4* 4.0 3.6  CL 96* 96*  --  97*  CO2 29 31  --  29  GLUCOSE 152* 159*  --  146*  BUN 88* 103*  --  101*  CREATININE 1.95* 2.24*  --  2.01*  CALCIUM 9.5 9.5  --  9.4  MG 2.2 1.7 2.1 1.9  PHOS 1.6* 2.2*  --  1.6*    ABG: Recent Labs  Lab 01/15/20 1140  PHART 7.296*  PCO2ART 64.9*  PO2ART 167*  HCO3 30.9*  O2SAT 99.4    Liver Function Tests: Recent Labs  Lab 01/12/20 0527 01/15/20 0706 01/17/20 0500  ALBUMIN 2.2* 2.0* 2.3*   No results for input(s): LIPASE, AMYLASE in the last 168 hours. No results for input(s): AMMONIA in the last 168 hours.  CBC: Recent Labs  Lab 01/12/20 0527 01/15/20 0706 01/16/20 0547 01/17/20 0500  WBC 19.6* 20.6*  24.2* 20.3*  HGB 7.3* 7.0* 8.1* 7.4*  HCT 23.3* 22.3* 25.3* 23.8*  MCV 86.0 87.1 84.9 85.9  PLT 224 259 231 227    Cardiac Enzymes: No results for input(s): CKTOTAL, CKMB, CKMBINDEX, TROPONINI in the last 168 hours.  BNP (last 3 results) No results for input(s): BNP in the last 8760 hours.  ProBNP (last 3 results) No results for input(s): PROBNP in the last 8760 hours.  Radiological Exams: No results found.  Assessment/Plan Active Problems:   Acute on chronic respiratory failure with hypoxia (HCC)   COVID-19 virus infection   Atrial fibrillation with RVR (HCC)   Severe sepsis (HCC)   Pneumonia due to COVID-19 virus   1. Acute on chronic respiratory failure with hypoxia plan is to wean as tolerated on pressure support try the NAG again goal will be 12 hours on the NAC and pressure support for 12 hours as tolerated.  Continue supportive measures and pulmonary toilet. 2. COVID-19 virus infection treated now is in resolution phase 3. Atrial fibrillation rate controlled medical management 4. Severe sepsis resolved hemodynamics are stable 5. Pneumonia due to COVID-19 treated we will continue to follow   I have personally seen and evaluated the patient, evaluated laboratory and imaging results, formulated the  assessment and plan and placed orders. The Patient requires high complexity decision making with multiple systems involvement.  Rounds were done with the Respiratory Therapy Director and Staff therapists and discussed with nursing staff also.  Allyne Gee, MD [facility] Pulmonary Critical Care Medicine Sleep Medicine

## 2020-01-17 NOTE — Progress Notes (Signed)
Let me know [facility]  ROUNDING NOTE   Subjective:  Patient seen during dialysis treatment. Tolerating well thus far. Ultrafiltration target today is 3 kg.  Objective:  Vital signs in last 24 hours:  Temperature 97.4 pulse 69 respirations 29 blood pressure 129/58  Physical Exam: General: Critically ill-appearing  Head: Normocephalic, atraumatic. Moist oral mucosal membranes  Eyes: Anicteric  Neck: Tracheostomy in place  Lungs:  Scattered rhonchi, vent assisted, tachypneic  Heart: S1S2 no rubs  Abdomen:  Soft, nontender, bowel sounds present, PEG tube in place  Extremities: 3+ peripheral edema  Neurologic: Awake, alert, will follow simple commands  Skin: No rash  Access: Right IJ temporary dialysis catheter    Basic Metabolic Panel: Recent Labs  Lab 01/12/20 0527 01/15/20 0706 01/16/20 0547 01/17/20 0500  NA 136 137  --  135  K 3.8 3.4* 4.0 3.6  CL 96* 96*  --  97*  CO2 29 31  --  29  GLUCOSE 152* 159*  --  146*  BUN 88* 103*  --  101*  CREATININE 1.95* 2.24*  --  2.01*  CALCIUM 9.5 9.5  --  9.4  MG 2.2 1.7 2.1 1.9  PHOS 1.6* 2.2*  --  1.6*    Liver Function Tests: Recent Labs  Lab 01/12/20 0527 01/15/20 0706 01/17/20 0500  ALBUMIN 2.2* 2.0* 2.3*   No results for input(s): LIPASE, AMYLASE in the last 168 hours. No results for input(s): AMMONIA in the last 168 hours.  CBC: Recent Labs  Lab 01/12/20 0527 01/15/20 0706 01/16/20 0547 01/17/20 0500  WBC 19.6* 20.6* 24.2* 20.3*  HGB 7.3* 7.0* 8.1* 7.4*  HCT 23.3* 22.3* 25.3* 23.8*  MCV 86.0 87.1 84.9 85.9  PLT 224 259 231 227    Cardiac Enzymes: No results for input(s): CKTOTAL, CKMB, CKMBINDEX, TROPONINI in the last 168 hours.  BNP: Invalid input(s): POCBNP  CBG: No results for input(s): GLUCAP in the last 168 hours.  Microbiology: Results for orders placed or performed during the hospital encounter of 11/21/19  Culture, Urine     Status: Abnormal   Collection Time: 11/22/19   1:25 PM   Specimen: Urine, Random  Result Value Ref Range Status   Specimen Description URINE, RANDOM  Final   Special Requests   Final    NONE Performed at [facility], [address], [place], [place] [address]    Culture 50,000 COLONIES/mL YEAST (A)  Final   Report Status 11/23/2019 FINAL  Final  Culture, blood (routine x 2)     Status: None   Collection Time: 11/22/19  1:40 PM   Specimen: BLOOD LEFT HAND  Result Value Ref Range Status   Specimen Description BLOOD LEFT HAND  Final   Special Requests   Final    BOTTLES DRAWN AEROBIC ONLY Blood Culture results may not be optimal due to an inadequate volume of blood received in culture bottles   Culture   Final    NO GROWTH 5 DAYS Performed at [facility], [place] [address] [place], [place] [address]    Report Status 11/27/2019 FINAL  Final  Culture, blood (routine x 2)     Status: None   Collection Time: 11/22/19  1:44 PM   Specimen: BLOOD LEFT HAND  Result Value Ref Range Status   Specimen Description BLOOD LEFT HAND  Final   Special Requests   Final    BOTTLES DRAWN AEROBIC ONLY Blood Culture results may not be optimal due to  an inadequate volume of blood received in culture bottles   Culture   Final    NO GROWTH 5 DAYS Performed at [facility], [place] [address], [place], [place] [address]    Report Status 11/27/2019 FINAL  Final  Culture, respiratory (non-expectorated)     Status: None   Collection Time: 11/22/19  4:59 PM   Specimen: Tracheal Aspirate; Respiratory  Result Value Ref Range Status   Specimen Description TRACHEAL ASPIRATE  Final   Special Requests NONE  Final   Gram Stain   Final    ABUNDANT WBC PRESENT, PREDOMINANTLY PMN NO ORGANISMS SEEN    Culture   Final    NO GROWTH 2 DAYS Performed at [facility], [address] [place], [place] [address]    Report Status 11/24/2019 FINAL  Final  Culture, respiratory (non-expectorated)     Status: None   Collection Time: 12/04/19  2:22  AM   Specimen: Tracheal Aspirate; Respiratory  Result Value Ref Range Status   Specimen Description TRACHEAL ASPIRATE  Final   Special Requests NONE  Final   Gram Stain   Final    ABUNDANT WBC PRESENT, PREDOMINANTLY PMN ABUNDANT GRAM NEGATIVE RODS Performed at [facility], [address] [place], [place] [address]    Culture   Final    MODERATE PSEUDOMONAS AERUGINOSA FEW ESCHERICHIA COLI Confirmed Extended Spectrum Beta-Lactamase Producer (ESBL).  In bloodstream infections from ESBL organisms, carbapenems are preferred over piperacillin/tazobactam. They are shown to have a lower risk of mortality.    Report Status 12/09/2019 FINAL  Final   Organism ID, Bacteria PSEUDOMONAS AERUGINOSA  Final   Organism ID, Bacteria ESCHERICHIA COLI  Final      Susceptibility   Escherichia coli - MIC*    AMPICILLIN >=32 RESISTANT Resistant     CEFAZOLIN >=64 RESISTANT Resistant     CEFEPIME 2 SENSITIVE Sensitive     CEFTAZIDIME RESISTANT Resistant     CEFTRIAXONE >=64 RESISTANT Resistant     CIPROFLOXACIN <=0.25 SENSITIVE Sensitive     GENTAMICIN >=16 RESISTANT Resistant     IMIPENEM <=0.25 SENSITIVE Sensitive     TRIMETH/SULFA >=320 RESISTANT Resistant     AMPICILLIN/SULBACTAM 16 INTERMEDIATE Intermediate     PIP/TAZO <=4 SENSITIVE Sensitive     * FEW ESCHERICHIA COLI   Pseudomonas aeruginosa - MIC*    CEFTAZIDIME 16 INTERMEDIATE Intermediate     CIPROFLOXACIN >=4 RESISTANT Resistant     GENTAMICIN 2 SENSITIVE Sensitive     IMIPENEM >=16 RESISTANT Resistant     * MODERATE PSEUDOMONAS AERUGINOSA  Culture, Urine     Status: Abnormal   Collection Time: 12/04/19  6:33 PM   Specimen: Urine, Random  Result Value Ref Range Status   Specimen Description URINE, RANDOM  Final   Special Requests   Final    NONE Performed at [facility], [address], [place], [place] [address]    Culture >=100,000 COLONIES/mL PSEUDOMONAS AERUGINOSA (A)  Final   Report Status 12/06/2019 FINAL   Final   Organism ID, Bacteria PSEUDOMONAS AERUGINOSA (A)  Final      Susceptibility   Pseudomonas aeruginosa - MIC*    CEFTAZIDIME 4 SENSITIVE Sensitive     CIPROFLOXACIN >=4 RESISTANT Resistant     GENTAMICIN 4 SENSITIVE Sensitive     IMIPENEM 2 SENSITIVE Sensitive     PIP/TAZO 16 SENSITIVE Sensitive     * >=100,000 COLONIES/mL PSEUDOMONAS AERUGINOSA  Culture, blood (routine x 2)  Status: Abnormal   Collection Time: 12/11/19  9:39 AM   Specimen: BLOOD LEFT HAND  Result Value Ref Range Status   Specimen Description BLOOD LEFT HAND  Final   Special Requests   Final    BOTTLES DRAWN AEROBIC AND ANAEROBIC Blood Culture adequate volume   Culture  Setup Time   Final    GRAM POSITIVE COCCI IN CLUSTERS ANAEROBIC BOTTLE ONLY CRITICAL RESULT CALLED TO, READ BACK BY AND VERIFIED WITH: RN R [place] [id number] AT 50 BY CM    Culture (A)  Final    STAPHYLOCOCCUS SPECIES (COAGULASE NEGATIVE) THE SIGNIFICANCE OF ISOLATING THIS ORGANISM FROM A SINGLE SET OF BLOOD CULTURES WHEN MULTIPLE SETS ARE DRAWN IS UNCERTAIN. PLEASE NOTIFY THE MICROBIOLOGY DEPARTMENT WITHIN ONE WEEK IF SPECIATION AND SENSITIVITIES ARE REQUIRED. Performed at [facility], [place] [address], [place], [place] [address]    Report Status 12/14/2019 FINAL  Final  Culture, blood (routine x 2)     Status: None   Collection Time: 12/11/19  9:47 AM   Specimen: BLOOD  Result Value Ref Range Status   Specimen Description BLOOD RIGHT ANTECUBITAL  Final   Special Requests   Final    BOTTLES DRAWN AEROBIC AND ANAEROBIC Blood Culture adequate volume   Culture  Setup Time   Final    CORRECTED RESULTS NO ORGANISMS SEEN PREVIOUSLY REPORTED AS: GRAM POSITIVE COCCI IN CLUSTERS CORRECTED RESULTS CALLED TO: RN R [place] [id number] AT 920 AM BY CM    Culture   Final    NO GROWTH 5 DAYS Performed at [facility], [place] [address] [place], [place] [address]    Report Status 12/16/2019 FINAL  Final  Culture, respiratory  (non-expectorated)     Status: None   Collection Time: 12/11/19 10:27 AM   Specimen: Tracheal Aspirate; Respiratory  Result Value Ref Range Status   Specimen Description TRACHEAL ASPIRATE  Final   Special Requests NONE  Final   Gram Stain NO WBC SEEN NO ORGANISMS SEEN   Final   Culture   Final    FEW PSEUDOMONAS AERUGINOSA FEW STENOTROPHOMONAS MALTOPHILIA SEE SEPARATE REPORT IN [facility] FOR DOS 12/11/19. Performed at [facility], [place] [address], [place], [place] [address]    Report Status 12/26/2019 FINAL  Final   Organism ID, Bacteria PSEUDOMONAS AERUGINOSA  Final   Organism ID, Bacteria STENOTROPHOMONAS MALTOPHILIA  Final      Susceptibility   Pseudomonas aeruginosa - MIC*    CEFTAZIDIME 16 INTERMEDIATE Intermediate     CIPROFLOXACIN >=4 RESISTANT Resistant     GENTAMICIN 2 SENSITIVE Sensitive     IMIPENEM >=16 RESISTANT Resistant     * FEW PSEUDOMONAS AERUGINOSA   Stenotrophomonas maltophilia - MIC*    LEVOFLOXACIN 0.5 SENSITIVE Sensitive     TRIMETH/SULFA <=20 SENSITIVE Sensitive     * FEW STENOTROPHOMONAS MALTOPHILIA  Culture, Urine     Status: Abnormal   Collection Time: 12/11/19 11:30 AM   Specimen: Urine, Random  Result Value Ref Range Status   Specimen Description URINE, RANDOM  Final   Special Requests NONE  Final   Culture (A)  Final    <10,000 COLONIES/mL INSIGNIFICANT GROWTH Performed at [facility], [place] [address] [place], [place] [address]    Report Status 12/12/2019 FINAL  Final  Culture, blood (routine x 2)     Status: None   Collection Time: 12/15/19 12:37 PM   Specimen: BLOOD LEFT HAND  Result Value Ref Range Status  Specimen Description BLOOD LEFT HAND  Final   Special Requests   Final    BOTTLES DRAWN AEROBIC AND ANAEROBIC Blood Culture adequate volume   Culture   Final    NO GROWTH 5 DAYS Performed at [facility], [address] [place], [place] [address]    Report Status 12/20/2019 FINAL  Final  Culture, blood (routine x 2)      Status: None   Collection Time: 12/15/19 12:42 PM   Specimen: BLOOD LEFT HAND  Result Value Ref Range Status   Specimen Description BLOOD LEFT HAND  Final   Special Requests   Final    BOTTLES DRAWN AEROBIC AND ANAEROBIC Blood Culture adequate volume   Culture   Final    NO GROWTH 5 DAYS Performed at [facility], [place] [address], [place], [place] [address]    Report Status 12/20/2019 FINAL  Final  C Difficile Quick Screen w PCR reflex     Status: None   Collection Time: 12/17/19  7:00 PM  Result Value Ref Range Status   C Diff antigen NEGATIVE NEGATIVE Final   C Diff toxin NEGATIVE NEGATIVE Final   C Diff interpretation No C. difficile detected.  Final    Comment: Performed at [facility], [place] [address], [place], [place] [address]  SARS CORONAVIRUS 2 (TAT 6-24 HRS) Nasopharyngeal Nasopharyngeal Swab     Status: None   Collection Time: 12/20/19 11:45 AM   Specimen: Nasopharyngeal Swab  Result Value Ref Range Status   SARS Coronavirus 2 NEGATIVE NEGATIVE Final    Comment: (NOTE) SARS-CoV-2 target nucleic acids are NOT DETECTED. The SARS-CoV-2 RNA is generally detectable in upper and lower respiratory specimens during the acute phase of infection. Negative results do not preclude SARS-CoV-2 infection, do not rule out co-infections with other pathogens, and should not be used as the sole basis for treatment or other patient management decisions. Negative results must be combined with clinical observations, patient history, and epidemiological information. The expected result is Negative. Fact Sheet for Patients: SugarRoll.be Fact Sheet for Healthcare Providers: https://www.woods-mathews.com/ This test is not yet approved or cleared by the [place] FDA and  has been authorized for detection and/or diagnosis of SARS-CoV-2 by FDA under an Emergency Use Authorization (EUA). This EUA will remain  in effect (meaning this  test can be used) for the duration of the COVID-19 declaration under Section 56 4(b)(1) of the Act, 21 U.S.C. section 360bbb-3(b)(1), unless the authorization is terminated or revoked sooner. Performed at [facility], [place] [address], [place], [place] [address]   Body fluid culture     Status: None   Collection Time: 12/21/19 12:10 PM   Specimen: Lung, Left; Pleural Fluid  Result Value Ref Range Status   Specimen Description PLEURAL FLUID  Final   Special Requests LEFT LUNG THORA  Final   Gram Stain   Final    RARE WBC PRESENT, PREDOMINANTLY MONONUCLEAR NO ORGANISMS SEEN    Culture   Final    NO GROWTH 3 DAYS Performed at [facility], [place] [address], [place], [place] [address]    Report Status 12/24/2019 FINAL  Final  Culture, blood (routine x 2)     Status: None   Collection Time: 12/25/19  2:16 PM   Specimen: BLOOD RIGHT HAND  Result Value Ref Range Status   Specimen Description BLOOD RIGHT HAND  Final   Special Requests   Final    BOTTLES DRAWN  AEROBIC ONLY Blood Culture adequate volume   Culture   Final    NO GROWTH 5 DAYS Performed at [facility], [place] [address], [place], [place] [address]    Report Status 01/01/2020 FINAL  Final  Culture, blood (routine x 2)     Status: None   Collection Time: 12/25/19  2:18 PM   Specimen: BLOOD LEFT HAND  Result Value Ref Range Status   Specimen Description BLOOD LEFT HAND  Final   Special Requests   Final    BOTTLES DRAWN AEROBIC ONLY Blood Culture adequate volume   Culture   Final    NO GROWTH 5 DAYS Performed at [facility], [place] [address], [place], [place] [address]    Report Status 01/01/2020 FINAL  Final  Culture, respiratory (non-expectorated)     Status: None   Collection Time: 12/25/19  4:20 PM   Specimen: Tracheal Aspirate; Respiratory  Result Value Ref Range Status   Specimen Description TRACHEAL ASPIRATE  Final   Special Requests NONE  Final   Gram Stain   Final    ABUNDANT WBC  PRESENT, PREDOMINANTLY PMN NO ORGANISMS SEEN    Culture   Final    RARE Consistent with normal respiratory flora. Performed at [facility], [place] [address], [place], [place] [address]    Report Status 12/28/2019 FINAL  Final  Culture, respiratory (non-expectorated)     Status: None   Collection Time: 01/12/20  4:14 PM   Specimen: Tracheal Aspirate; Respiratory  Result Value Ref Range Status   Specimen Description TRACHEAL ASPIRATE  Final   Special Requests NONE  Final   Gram Stain   Final    ABUNDANT WBC PRESENT, PREDOMINANTLY PMN ABUNDANT GRAM NEGATIVE RODS RARE GRAM POSITIVE RODS Performed at [facility], [place] [address] [place], [place] [address]    Culture   Final    ABUNDANT PSEUDOMONAS AERUGINOSA FEW STENOTROPHOMONAS MALTOPHILIA    Report Status 01/16/2020 FINAL  Final   Organism ID, Bacteria PSEUDOMONAS AERUGINOSA  Final   Organism ID, Bacteria STENOTROPHOMONAS MALTOPHILIA  Final      Susceptibility   Pseudomonas aeruginosa - MIC*    CEFTAZIDIME 16 INTERMEDIATE Intermediate     CIPROFLOXACIN >=4 RESISTANT Resistant     GENTAMICIN 2 SENSITIVE Sensitive     IMIPENEM 2 SENSITIVE Sensitive     * ABUNDANT PSEUDOMONAS AERUGINOSA   Stenotrophomonas maltophilia - MIC*    LEVOFLOXACIN 0.5 SENSITIVE Sensitive     TRIMETH/SULFA <=20 SENSITIVE Sensitive     * FEW STENOTROPHOMONAS MALTOPHILIA    Coagulation Studies: No results for input(s): LABPROT, INR in the last 72 hours.  Urinalysis: No results for input(s): COLORURINE, LABSPEC, PHURINE, GLUCOSEU, HGBUR, BILIRUBINUR, KETONESUR, PROTEINUR, UROBILINOGEN, NITRITE, LEUKOCYTESUR in the last 72 hours.  Invalid input(s): APPERANCEUR    Imaging: No results found.   Medications:     heparin sodium (porcine), lidocaine, lidocaine  Assessment/ Plan:  74 y.o. male with a PMHx of acute respiratory failure status post COVID-19 infection status post tracheostomy placement, atrial fibrillation, hypertension,  CVA, gout, hypertrophic cardiomyopathy, who was admitted to [facility] on 11/21/2019 for ongoing treatment of acute respiratory failure.  1.  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  Patient seen during dialysis treatment.  Tolerating well.  Ultrafiltration target 3 kg.  2.  Acute respiratory failure secondary to COVID-19 infection and its sequela.  -Continues to be tachypneic.  Continue aggressive ultrafiltration with dialysis  treatments.  3.  Hyperkalemia.  Resolved, potassium 3.6 at last check.  4.  Hyponatremia.  Serum sodium appears to have stabilized and currently 135.  Continue to monitor.       LOS: 0 Shane Banks 4/21/20218:24 AM

## 2020-01-18 DIAGNOSIS — I4891 Unspecified atrial fibrillation: Secondary | ICD-10-CM | POA: Diagnosis not present

## 2020-01-18 DIAGNOSIS — J9621 Acute and chronic respiratory failure with hypoxia: Secondary | ICD-10-CM | POA: Diagnosis not present

## 2020-01-18 DIAGNOSIS — N179 Acute kidney failure, unspecified: Secondary | ICD-10-CM | POA: Diagnosis not present

## 2020-01-18 DIAGNOSIS — U071 COVID-19: Secondary | ICD-10-CM | POA: Diagnosis not present

## 2020-01-18 NOTE — Progress Notes (Signed)
Pulmonary Critical Care Medicine [place]   PULMONARY CRITICAL CARE SERVICE  PROGRESS NOTE  Date of Service: 01/18/2020  Shane Banks  MIW:[id number]  DOB: 1946/01/14   DOA: 11/21/2019  Referring Physician: [name], MD  HPI: Shane Banks is a 74 y.o. male seen for follow up of Acute on Chronic Respiratory Failure.  Patient currently is on pressure support on 12/7 doing well should be able to do the NAG today  Medications: Reviewed on Rounds  Physical Exam:  Vitals: Temperature 98.4 pulse 74 respiratory rate is 40 blood pressure 155/70 saturations 94%  Ventilator Settings on pressure support FiO2 40% pressure poor 12 PEEP 7  . General: Comfortable at this time . Eyes: Grossly normal lids, irises & conjunctiva . ENT: grossly tongue is normal . Neck: no obvious mass . Cardiovascular: S1 S2 normal no gallop . Respiratory: No rhonchi coarse breath sounds are noted . Abdomen: soft . Skin: no rash seen on limited exam . Musculoskeletal: not rigid . Psychiatric:unable to assess . Neurologic: no seizure no involuntary movements         Lab Data:   Basic Metabolic Panel: Recent Labs  Lab 01/12/20 0527 01/15/20 0706 01/16/20 0547 01/17/20 0500  NA 136 137  --  135  K 3.8 3.4* 4.0 3.6  CL 96* 96*  --  97*  CO2 29 31  --  29  GLUCOSE 152* 159*  --  146*  BUN 88* 103*  --  101*  CREATININE 1.95* 2.24*  --  2.01*  CALCIUM 9.5 9.5  --  9.4  MG 2.2 1.7 2.1 1.9  PHOS 1.6* 2.2*  --  1.6*    ABG: Recent Labs  Lab 01/15/20 1140  PHART 7.296*  PCO2ART 64.9*  PO2ART 167*  HCO3 30.9*  O2SAT 99.4    Liver Function Tests: Recent Labs  Lab 01/12/20 0527 01/15/20 0706 01/17/20 0500  ALBUMIN 2.2* 2.0* 2.3*   No results for input(s): LIPASE, AMYLASE in the last 168 hours. No results for input(s): AMMONIA in the last 168 hours.  CBC: Recent Labs  Lab 01/12/20 0527 01/15/20 0706 01/16/20 0547 01/17/20 0500  WBC 19.6* 20.6* 24.2* 20.3*   HGB 7.3* 7.0* 8.1* 7.4*  HCT 23.3* 22.3* 25.3* 23.8*  MCV 86.0 87.1 84.9 85.9  PLT 224 259 231 227    Cardiac Enzymes: No results for input(s): CKTOTAL, CKMB, CKMBINDEX, TROPONINI in the last 168 hours.  BNP (last 3 results) No results for input(s): BNP in the last 8760 hours.  ProBNP (last 3 results) No results for input(s): PROBNP in the last 8760 hours.  Radiological Exams: No results found.  Assessment/Plan Active Problems:   Acute on chronic respiratory failure with hypoxia (HCC)   COVID-19 virus infection   Atrial fibrillation with RVR (HCC)   Severe sepsis (HCC)   Pneumonia due to COVID-19 virus   1. Acute on chronic respiratory failure with hypoxia we will continue with full support on pressure support and then try the NAG during the daytime. 2. COVID-19 virus infection resolved 3. Atrial fibrillation rate controlled 4. Severe sepsis resolved 5. Pneumonia due to COVID-19 treated clinically is improved   I have personally seen and evaluated the patient, evaluated laboratory and imaging results, formulated the assessment and plan and placed orders. The Patient requires high complexity decision making with multiple systems involvement.  Rounds were done with the Respiratory Therapy Director and Staff therapists and discussed with nursing staff also.  Allyne Gee, MD [facility] Pulmonary  Critical Care Medicine Sleep Medicine

## 2020-01-19 DIAGNOSIS — N179 Acute kidney failure, unspecified: Secondary | ICD-10-CM | POA: Diagnosis not present

## 2020-01-19 DIAGNOSIS — J9621 Acute and chronic respiratory failure with hypoxia: Secondary | ICD-10-CM | POA: Diagnosis not present

## 2020-01-19 DIAGNOSIS — U071 COVID-19: Secondary | ICD-10-CM | POA: Diagnosis not present

## 2020-01-19 DIAGNOSIS — I4891 Unspecified atrial fibrillation: Secondary | ICD-10-CM | POA: Diagnosis not present

## 2020-01-19 LAB — RENAL FUNCTION PANEL
Albumin: 2.4 g/dL — ABNORMAL LOW (ref 3.5–5.0)
Anion gap: 10 (ref 5–15)
BUN: 87 mg/dL — ABNORMAL HIGH (ref 8–23)
CO2: 29 mmol/L (ref 22–32)
Calcium: 9.6 mg/dL (ref 8.9–10.3)
Chloride: 98 mmol/L (ref 98–111)
Creatinine, Ser: 1.91 mg/dL — ABNORMAL HIGH (ref 0.61–1.24)
GFR calc Af Amer: 40 mL/min — ABNORMAL LOW (ref >60)
GFR calc non Af Amer: 34 mL/min — ABNORMAL LOW (ref >60)
Glucose, Bld: 138 mg/dL — ABNORMAL HIGH (ref 70–99)
Phosphorus: 2.8 mg/dL (ref 2.5–4.6)
Potassium: 3.8 mmol/L (ref 3.5–5.1)
Sodium: 137 mmol/L (ref 135–145)

## 2020-01-19 LAB — CBC
HCT: 23.4 % — ABNORMAL LOW (ref 39.0–52.0)
Hemoglobin: 7.1 g/dL — ABNORMAL LOW (ref 13.0–17.0)
MCH: 26.5 pg (ref 26.0–34.0)
MCHC: 30.3 g/dL (ref 30.0–36.0)
MCV: 87.3 fL (ref 80.0–100.0)
Platelets: 230 10*3/uL (ref 150–400)
RBC: 2.68 MIL/uL — ABNORMAL LOW (ref 4.22–5.81)
RDW: 18.4 % — ABNORMAL HIGH (ref 11.5–15.5)
WBC: 21.8 10*3/uL — ABNORMAL HIGH (ref 4.0–10.5)
nRBC: 0 % (ref 0.0–0.2)

## 2020-01-19 LAB — MAGNESIUM: Magnesium: 1.9 mg/dL (ref 1.7–2.4)

## 2020-01-19 NOTE — Progress Notes (Signed)
Let me know [facility]  ROUNDING NOTE   Subjective:  Patient seen and evaluated during dialysis treatment.  Appears to be tolerating well. Received albumin during dialysis treatment.  Objective:  Vital signs in last 24 hours:  Temperature 97.4 pulse 66 respirations 40 blood pressure 99/52  Physical Exam: General: Critically ill-appearing  Head: Normocephalic, atraumatic. Moist oral mucosal membranes  Eyes: Anicteric  Neck: Tracheostomy in place  Lungs:  Scattered rhonchi, vent assisted, tachypneic  Heart: S1S2 no rubs  Abdomen:  Soft, nontender, bowel sounds present, PEG tube in place  Extremities: 3+ peripheral edema  Neurologic: Awake, alert, will follow simple commands  Skin: No rash  Access: Right IJ temporary dialysis catheter    Basic Metabolic Panel: Recent Labs  Lab 01/15/20 0706 01/16/20 0547 01/17/20 0500  NA 137  --  135  K 3.4* 4.0 3.6  CL 96*  --  97*  CO2 31  --  29  GLUCOSE 159*  --  146*  BUN 103*  --  101*  CREATININE 2.24*  --  2.01*  CALCIUM 9.5  --  9.4  MG 1.7 2.1 1.9  PHOS 2.2*  --  1.6*    Liver Function Tests: Recent Labs  Lab 01/15/20 0706 01/17/20 0500  ALBUMIN 2.0* 2.3*   No results for input(s): LIPASE, AMYLASE in the last 168 hours. No results for input(s): AMMONIA in the last 168 hours.  CBC: Recent Labs  Lab 01/15/20 0706 01/16/20 0547 01/17/20 0500  WBC 20.6* 24.2* 20.3*  HGB 7.0* 8.1* 7.4*  HCT 22.3* 25.3* 23.8*  MCV 87.1 84.9 85.9  PLT 259 231 227    Cardiac Enzymes: No results for input(s): CKTOTAL, CKMB, CKMBINDEX, TROPONINI in the last 168 hours.  BNP: Invalid input(s): POCBNP  CBG: No results for input(s): GLUCAP in the last 168 hours.  Microbiology: Results for orders placed or performed during the hospital encounter of 11/21/19  Culture, Urine     Status: Abnormal   Collection Time: 11/22/19  1:25 PM   Specimen: Urine, Random  Result Value Ref Range Status   Specimen Description  URINE, RANDOM  Final   Special Requests   Final    NONE Performed at [facility], [address], [place], [place] [address]    Culture 50,000 COLONIES/mL YEAST (A)  Final   Report Status 11/23/2019 FINAL  Final  Culture, blood (routine x 2)     Status: None   Collection Time: 11/22/19  1:40 PM   Specimen: BLOOD LEFT HAND  Result Value Ref Range Status   Specimen Description BLOOD LEFT HAND  Final   Special Requests   Final    BOTTLES DRAWN AEROBIC ONLY Blood Culture results may not be optimal due to an inadequate volume of blood received in culture bottles   Culture   Final    NO GROWTH 5 DAYS Performed at [facility], [place] [address] [place], [place] [address]    Report Status 11/27/2019 FINAL  Final  Culture, blood (routine x 2)     Status: None   Collection Time: 11/22/19  1:44 PM   Specimen: BLOOD LEFT HAND  Result Value Ref Range Status   Specimen Description BLOOD LEFT HAND  Final   Special Requests   Final    BOTTLES DRAWN AEROBIC ONLY Blood Culture results may not be optimal due to an inadequate volume of blood received in culture bottles   Culture   Final    NO  GROWTH 5 DAYS Performed at [facility], [place] [address], [place], [place] [address]    Report Status 11/27/2019 FINAL  Final  Culture, respiratory (non-expectorated)     Status: None   Collection Time: 11/22/19  4:59 PM   Specimen: Tracheal Aspirate; Respiratory  Result Value Ref Range Status   Specimen Description TRACHEAL ASPIRATE  Final   Special Requests NONE  Final   Gram Stain   Final    ABUNDANT WBC PRESENT, PREDOMINANTLY PMN NO ORGANISMS SEEN    Culture   Final    NO GROWTH 2 DAYS Performed at [facility], [address] [place], [place] [address]    Report Status 11/24/2019 FINAL  Final  Culture, respiratory (non-expectorated)     Status: None   Collection Time: 12/04/19  2:22 AM   Specimen: Tracheal Aspirate; Respiratory  Result Value Ref Range Status   Specimen  Description TRACHEAL ASPIRATE  Final   Special Requests NONE  Final   Gram Stain   Final    ABUNDANT WBC PRESENT, PREDOMINANTLY PMN ABUNDANT GRAM NEGATIVE RODS Performed at [facility], [address] [place], [place] [address]    Culture   Final    MODERATE PSEUDOMONAS AERUGINOSA FEW ESCHERICHIA COLI Confirmed Extended Spectrum Beta-Lactamase Producer (ESBL).  In bloodstream infections from ESBL organisms, carbapenems are preferred over piperacillin/tazobactam. They are shown to have a lower risk of mortality.    Report Status 12/09/2019 FINAL  Final   Organism ID, Bacteria PSEUDOMONAS AERUGINOSA  Final   Organism ID, Bacteria ESCHERICHIA COLI  Final      Susceptibility   Escherichia coli - MIC*    AMPICILLIN >=32 RESISTANT Resistant     CEFAZOLIN >=64 RESISTANT Resistant     CEFEPIME 2 SENSITIVE Sensitive     CEFTAZIDIME RESISTANT Resistant     CEFTRIAXONE >=64 RESISTANT Resistant     CIPROFLOXACIN <=0.25 SENSITIVE Sensitive     GENTAMICIN >=16 RESISTANT Resistant     IMIPENEM <=0.25 SENSITIVE Sensitive     TRIMETH/SULFA >=320 RESISTANT Resistant     AMPICILLIN/SULBACTAM 16 INTERMEDIATE Intermediate     PIP/TAZO <=4 SENSITIVE Sensitive     * FEW ESCHERICHIA COLI   Pseudomonas aeruginosa - MIC*    CEFTAZIDIME 16 INTERMEDIATE Intermediate     CIPROFLOXACIN >=4 RESISTANT Resistant     GENTAMICIN 2 SENSITIVE Sensitive     IMIPENEM >=16 RESISTANT Resistant     * MODERATE PSEUDOMONAS AERUGINOSA  Culture, Urine     Status: Abnormal   Collection Time: 12/04/19  6:33 PM   Specimen: Urine, Random  Result Value Ref Range Status   Specimen Description URINE, RANDOM  Final   Special Requests   Final    NONE Performed at [facility], [address], [place], [place] [address]    Culture >=100,000 COLONIES/mL PSEUDOMONAS AERUGINOSA (A)  Final   Report Status 12/06/2019 FINAL  Final   Organism ID, Bacteria PSEUDOMONAS AERUGINOSA (A)  Final      Susceptibility    Pseudomonas aeruginosa - MIC*    CEFTAZIDIME 4 SENSITIVE Sensitive     CIPROFLOXACIN >=4 RESISTANT Resistant     GENTAMICIN 4 SENSITIVE Sensitive     IMIPENEM 2 SENSITIVE Sensitive     PIP/TAZO 16 SENSITIVE Sensitive     * >=100,000 COLONIES/mL PSEUDOMONAS AERUGINOSA  Culture, blood (routine x 2)     Status: Abnormal   Collection Time: 12/11/19  9:39 AM   Specimen: BLOOD LEFT HAND  Result Value Ref Range Status   Specimen Description BLOOD LEFT HAND  Final   Special Requests   Final    BOTTLES DRAWN AEROBIC AND ANAEROBIC Blood Culture adequate volume   Culture  Setup Time   Final    GRAM POSITIVE COCCI IN CLUSTERS ANAEROBIC BOTTLE ONLY CRITICAL RESULT CALLED TO, READ BACK BY AND VERIFIED WITH: RN R [place] [id number] AT 20 BY CM    Culture (A)  Final    STAPHYLOCOCCUS SPECIES (COAGULASE NEGATIVE) THE SIGNIFICANCE OF ISOLATING THIS ORGANISM FROM A SINGLE SET OF BLOOD CULTURES WHEN MULTIPLE SETS ARE DRAWN IS UNCERTAIN. PLEASE NOTIFY THE MICROBIOLOGY DEPARTMENT WITHIN ONE WEEK IF SPECIATION AND SENSITIVITIES ARE REQUIRED. Performed at [facility], [place] [address], [place], [place] [address]    Report Status 12/14/2019 FINAL  Final  Culture, blood (routine x 2)     Status: None   Collection Time: 12/11/19  9:47 AM   Specimen: BLOOD  Result Value Ref Range Status   Specimen Description BLOOD RIGHT ANTECUBITAL  Final   Special Requests   Final    BOTTLES DRAWN AEROBIC AND ANAEROBIC Blood Culture adequate volume   Culture  Setup Time   Final    CORRECTED RESULTS NO ORGANISMS SEEN PREVIOUSLY REPORTED AS: GRAM POSITIVE COCCI IN CLUSTERS CORRECTED RESULTS CALLED TO: RN R [place] [id number] AT 920 AM BY CM    Culture   Final    NO GROWTH 5 DAYS Performed at [facility], [place] [address] [place], [place] [address]    Report Status 12/16/2019 FINAL  Final  Culture, respiratory (non-expectorated)     Status: None   Collection Time: 12/11/19 10:27 AM   Specimen: Tracheal  Aspirate; Respiratory  Result Value Ref Range Status   Specimen Description TRACHEAL ASPIRATE  Final   Special Requests NONE  Final   Gram Stain NO WBC SEEN NO ORGANISMS SEEN   Final   Culture   Final    FEW PSEUDOMONAS AERUGINOSA FEW STENOTROPHOMONAS MALTOPHILIA SEE SEPARATE REPORT IN [facility] FOR DOS 12/11/19. Performed at [facility], [place] [address] [place], [place] [address]    Report Status 12/26/2019 FINAL  Final   Organism ID, Bacteria PSEUDOMONAS AERUGINOSA  Final   Organism ID, Bacteria STENOTROPHOMONAS MALTOPHILIA  Final      Susceptibility   Pseudomonas aeruginosa - MIC*    CEFTAZIDIME 16 INTERMEDIATE Intermediate     CIPROFLOXACIN >=4 RESISTANT Resistant     GENTAMICIN 2 SENSITIVE Sensitive     IMIPENEM >=16 RESISTANT Resistant     * FEW PSEUDOMONAS AERUGINOSA   Stenotrophomonas maltophilia - MIC*    LEVOFLOXACIN 0.5 SENSITIVE Sensitive     TRIMETH/SULFA <=20 SENSITIVE Sensitive     * FEW STENOTROPHOMONAS MALTOPHILIA  Culture, Urine     Status: Abnormal   Collection Time: 12/11/19 11:30 AM   Specimen: Urine, Random  Result Value Ref Range Status   Specimen Description URINE, RANDOM  Final   Special Requests NONE  Final   Culture (A)  Final    <10,000 COLONIES/mL INSIGNIFICANT GROWTH Performed at [facility], [place] [address] [place], [place] [address]    Report Status 12/12/2019 FINAL  Final  Culture, blood (routine x 2)     Status: None   Collection Time: 12/15/19 12:37 PM   Specimen: BLOOD LEFT HAND  Result Value Ref Range Status   Specimen Description BLOOD LEFT HAND  Final   Special Requests   Final  BOTTLES DRAWN AEROBIC AND ANAEROBIC Blood Culture adequate volume   Culture   Final    NO GROWTH 5 DAYS Performed at [facility], [place] [address], [place], [place] [address]    Report Status 12/20/2019 FINAL  Final  Culture, blood (routine x 2)     Status: None   Collection Time: 12/15/19 12:42 PM   Specimen: BLOOD LEFT HAND  Result  Value Ref Range Status   Specimen Description BLOOD LEFT HAND  Final   Special Requests   Final    BOTTLES DRAWN AEROBIC AND ANAEROBIC Blood Culture adequate volume   Culture   Final    NO GROWTH 5 DAYS Performed at [facility], [place] [address], [place], [place] [address]    Report Status 12/20/2019 FINAL  Final  C Difficile Quick Screen w PCR reflex     Status: None   Collection Time: 12/17/19  7:00 PM  Result Value Ref Range Status   C Diff antigen NEGATIVE NEGATIVE Final   C Diff toxin NEGATIVE NEGATIVE Final   C Diff interpretation No C. difficile detected.  Final    Comment: Performed at [facility], [place] [address] [place], [place] [address]  SARS CORONAVIRUS 2 (TAT 6-24 HRS) Nasopharyngeal Nasopharyngeal Swab     Status: None   Collection Time: 12/20/19 11:45 AM   Specimen: Nasopharyngeal Swab  Result Value Ref Range Status   SARS Coronavirus 2 NEGATIVE NEGATIVE Final    Comment: (NOTE) SARS-CoV-2 target nucleic acids are NOT DETECTED. The SARS-CoV-2 RNA is generally detectable in upper and lower respiratory specimens during the acute phase of infection. Negative results do not preclude SARS-CoV-2 infection, do not rule out co-infections with other pathogens, and should not be used as the sole basis for treatment or other patient management decisions. Negative results must be combined with clinical observations, patient history, and epidemiological information. The expected result is Negative. Fact Sheet for Patients: SugarRoll.be Fact Sheet for Healthcare Providers: https://www.woods-mathews.com/ This test is not yet approved or cleared by the [place] FDA and  has been authorized for detection and/or diagnosis of SARS-CoV-2 by FDA under an Emergency Use Authorization (EUA). This EUA will remain  in effect (meaning this test can be used) for the duration of the COVID-19 declaration under Section 56 4(b)(1) of the  Act, 21 U.S.C. section 360bbb-3(b)(1), unless the authorization is terminated or revoked sooner. Performed at [facility], [place] [address] [place], [place] [address]   Body fluid culture     Status: None   Collection Time: 12/21/19 12:10 PM   Specimen: Lung, Left; Pleural Fluid  Result Value Ref Range Status   Specimen Description PLEURAL FLUID  Final   Special Requests LEFT LUNG THORA  Final   Gram Stain   Final    RARE WBC PRESENT, PREDOMINANTLY MONONUCLEAR NO ORGANISMS SEEN    Culture   Final    NO GROWTH 3 DAYS Performed at [facility], [place] [address] [place], [place] [address]    Report Status 12/24/2019 FINAL  Final  Culture, blood (routine x 2)     Status: None   Collection Time: 12/25/19  2:16 PM   Specimen: BLOOD RIGHT HAND  Result Value Ref Range Status   Specimen Description BLOOD RIGHT HAND  Final   Special Requests   Final    BOTTLES DRAWN AEROBIC ONLY Blood Culture adequate volume   Culture   Final    NO GROWTH  5 DAYS Performed at [facility], [place] [address] [place], [place] [address]    Report Status 01/01/2020 FINAL  Final  Culture, blood (routine x 2)     Status: None   Collection Time: 12/25/19  2:18 PM   Specimen: BLOOD LEFT HAND  Result Value Ref Range Status   Specimen Description BLOOD LEFT HAND  Final   Special Requests   Final    BOTTLES DRAWN AEROBIC ONLY Blood Culture adequate volume   Culture   Final    NO GROWTH 5 DAYS Performed at [facility], [place] [address] [place], [place] [address]    Report Status 01/01/2020 FINAL  Final  Culture, respiratory (non-expectorated)     Status: None   Collection Time: 12/25/19  4:20 PM   Specimen: Tracheal Aspirate; Respiratory  Result Value Ref Range Status   Specimen Description TRACHEAL ASPIRATE  Final   Special Requests NONE  Final   Gram Stain   Final    ABUNDANT WBC PRESENT, PREDOMINANTLY PMN NO ORGANISMS SEEN    Culture   Final    RARE Consistent with normal  respiratory flora. Performed at [facility], [place] [address], [place], [place] [address]    Report Status 12/28/2019 FINAL  Final  Culture, respiratory (non-expectorated)     Status: None   Collection Time: 01/12/20  4:14 PM   Specimen: Tracheal Aspirate; Respiratory  Result Value Ref Range Status   Specimen Description TRACHEAL ASPIRATE  Final   Special Requests NONE  Final   Gram Stain   Final    ABUNDANT WBC PRESENT, PREDOMINANTLY PMN ABUNDANT GRAM NEGATIVE RODS RARE GRAM POSITIVE RODS Performed at [facility], [place] [address], [place], [place] [address]    Culture   Final    ABUNDANT PSEUDOMONAS AERUGINOSA FEW STENOTROPHOMONAS MALTOPHILIA    Report Status 01/16/2020 FINAL  Final   Organism ID, Bacteria PSEUDOMONAS AERUGINOSA  Final   Organism ID, Bacteria STENOTROPHOMONAS MALTOPHILIA  Final      Susceptibility   Pseudomonas aeruginosa - MIC*    CEFTAZIDIME 16 INTERMEDIATE Intermediate     CIPROFLOXACIN >=4 RESISTANT Resistant     GENTAMICIN 2 SENSITIVE Sensitive     IMIPENEM 2 SENSITIVE Sensitive     * ABUNDANT PSEUDOMONAS AERUGINOSA   Stenotrophomonas maltophilia - MIC*    LEVOFLOXACIN 0.5 SENSITIVE Sensitive     TRIMETH/SULFA <=20 SENSITIVE Sensitive     * FEW STENOTROPHOMONAS MALTOPHILIA  Culture, blood (Routine X 2) w Reflex to ID Panel     Status: None (Preliminary result)   Collection Time: 01/16/20  3:30 PM   Specimen: BLOOD LEFT HAND  Result Value Ref Range Status   Specimen Description BLOOD LEFT HAND  Final   Special Requests   Final    BOTTLES DRAWN AEROBIC AND ANAEROBIC Blood Culture adequate volume   Culture   Final    NO GROWTH 2 DAYS Performed at [facility], [address], [place],  [address]    Report Status PENDING  Incomplete  Culture, blood (Routine X 2) w Reflex to ID Panel     Status: None (Preliminary result)   Collection Time: 01/16/20  3:32 PM   Specimen: BLOOD LEFT ARM  Result Value Ref Range Status   Specimen  Description BLOOD LEFT ARM  Final   Special Requests   Final    BOTTLES DRAWN AEROBIC AND ANAEROBIC Blood Culture adequate volume   Culture   Final  NO GROWTH 2 DAYS Performed at [facility], [place] [address] [place], [place] [address]    Report Status PENDING  Incomplete    Coagulation Studies: No results for input(s): LABPROT, INR in the last 72 hours.  Urinalysis: No results for input(s): COLORURINE, LABSPEC, PHURINE, GLUCOSEU, HGBUR, BILIRUBINUR, KETONESUR, PROTEINUR, UROBILINOGEN, NITRITE, LEUKOCYTESUR in the last 72 hours.  Invalid input(s): APPERANCEUR    Imaging: No results found.   Medications:     heparin sodium (porcine), lidocaine, lidocaine  Assessment/ Plan:  74 y.o. male with a PMHx of acute respiratory failure status post COVID-19 infection status post tracheostomy placement, atrial fibrillation, hypertension, CVA, gout, hypertrophic cardiomyopathy, who was admitted to [facility] on 11/21/2019 for ongoing treatment of acute respiratory failure.  1.  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  Patient seen and evaluated during hemodialysis treatment.  Did receive albumin today.  Tolerating treatment well.  Ultrafiltration target 2.5 kg today.  2.  Acute respiratory failure secondary to COVID-19 infection and its sequela.  -Maintained on ventilatory support and remains tachypneic.  Vent management per pulmonary/critical care.  3.  Hyperkalemia.  Resolved with dialysis treatments.  4.  Hyponatremia.  Recheck serum sodium today.  Most recent serum sodium was 135.  5.  Anemia of chronic kidney disease.  Hemoglobin 7.4.  Consider transfusion for hemoglobin of 7 or less.       LOS: 0 Georgenia Salim 4/23/20218:24 AM

## 2020-01-19 NOTE — Progress Notes (Addendum)
Pulmonary Critical Care Medicine [place]   PULMONARY CRITICAL CARE SERVICE  PROGRESS NOTE  Date of Service: 01/19/2020  Shane Banks  DQQ:[id number]  DOB: 1946/01/28   DOA: 11/21/2019  Referring Physician: [name], MD  HPI: Shane Banks is a 74 y.o. male seen for follow up of Acute on Chronic Respiratory Failure.  Patient mains on full support on the ventilator at this time with a rate of 18 and FiO2 of 40% no distress noted.  Medications: Reviewed on Rounds  Physical Exam:  Vitals: Pulse 66 respirations 40 BP 99/52 O2 sat 100% temp 97.4  Ventilator Settings ventilator mode AC VC rate of 18 tidal volume 350 PEEP of 7 with an FiO2 of 40%  . General: Comfortable at this time . Eyes: Grossly normal lids, irises & conjunctiva . ENT: grossly tongue is normal . Neck: no obvious mass . Cardiovascular: S1 S2 normal no gallop . Respiratory: No rales or rhonchi noted . Abdomen: soft . Skin: no rash seen on limited exam . Musculoskeletal: not rigid . Psychiatric:unable to assess . Neurologic: no seizure no involuntary movements         Lab Data:   Basic Metabolic Panel: Recent Labs  Lab 01/15/20 0706 01/16/20 0547 01/17/20 0500 01/19/20 0822  NA 137  --  135 137  K 3.4* 4.0 3.6 3.8  CL 96*  --  97* 98  CO2 31  --  29 29  GLUCOSE 159*  --  146* 138*  BUN 103*  --  101* 87*  CREATININE 2.24*  --  2.01* 1.91*  CALCIUM 9.5  --  9.4 9.6  MG 1.7 2.1 1.9 1.9  PHOS 2.2*  --  1.6* 2.8    ABG: Recent Labs  Lab 01/15/20 1140  PHART 7.296*  PCO2ART 64.9*  PO2ART 167*  HCO3 30.9*  O2SAT 99.4    Liver Function Tests: Recent Labs  Lab 01/15/20 0706 01/17/20 0500 01/19/20 0822  ALBUMIN 2.0* 2.3* 2.4*   No results for input(s): LIPASE, AMYLASE in the last 168 hours. No results for input(s): AMMONIA in the last 168 hours.  CBC: Recent Labs  Lab 01/15/20 0706 01/16/20 0547 01/17/20 0500 01/19/20 0822  WBC 20.6* 24.2* 20.3* 21.8*  HGB  7.0* 8.1* 7.4* 7.1*  HCT 22.3* 25.3* 23.8* 23.4*  MCV 87.1 84.9 85.9 87.3  PLT 259 231 227 230    Cardiac Enzymes: No results for input(s): CKTOTAL, CKMB, CKMBINDEX, TROPONINI in the last 168 hours.  BNP (last 3 results) No results for input(s): BNP in the last 8760 hours.  ProBNP (last 3 results) No results for input(s): PROBNP in the last 8760 hours.  Radiological Exams: No results found.  Assessment/Plan Active Problems:   Acute on chronic respiratory failure with hypoxia (HCC)   COVID-19 virus infection   Atrial fibrillation with RVR (HCC)   Severe sepsis (HCC)   Pneumonia due to COVID-19 virus   1. Acute on chronic respiratory failure with hypoxia patient continue on full support at this time continue to attempt weaning FiO2 as tolerated.  Continue supportive measures and pulmonary toilet. 2. COVID-19 virus infection resolved 3. Atrial fibrillation rate controlled 4. Severe sepsis resolved 5. Pneumonia due to COVID-19 treated clinically is improved   I have personally seen and evaluated the patient, evaluated laboratory and imaging results, formulated the assessment and plan and placed orders. The Patient requires high complexity decision making with multiple systems involvement.  Rounds were done with the Respiratory Therapy Director and Staff  therapists and discussed with nursing staff also.  Allyne Gee, MD [facility] Pulmonary Critical Care Medicine Sleep Medicine

## 2020-01-20 DIAGNOSIS — U071 COVID-19: Secondary | ICD-10-CM | POA: Diagnosis not present

## 2020-01-20 DIAGNOSIS — J9621 Acute and chronic respiratory failure with hypoxia: Secondary | ICD-10-CM | POA: Diagnosis not present

## 2020-01-20 DIAGNOSIS — N179 Acute kidney failure, unspecified: Secondary | ICD-10-CM | POA: Diagnosis not present

## 2020-01-20 DIAGNOSIS — I4891 Unspecified atrial fibrillation: Secondary | ICD-10-CM | POA: Diagnosis not present

## 2020-01-20 LAB — CBC
HCT: 24.8 % — ABNORMAL LOW (ref 39.0–52.0)
Hemoglobin: 7.5 g/dL — ABNORMAL LOW (ref 13.0–17.0)
MCH: 26.2 pg (ref 26.0–34.0)
MCHC: 30.2 g/dL (ref 30.0–36.0)
MCV: 86.7 fL (ref 80.0–100.0)
Platelets: 227 10*3/uL (ref 150–400)
RBC: 2.86 MIL/uL — ABNORMAL LOW (ref 4.22–5.81)
RDW: 18.3 % — ABNORMAL HIGH (ref 11.5–15.5)
WBC: 22.3 10*3/uL — ABNORMAL HIGH (ref 4.0–10.5)
nRBC: 0 % (ref 0.0–0.2)

## 2020-01-20 NOTE — Progress Notes (Signed)
Pulmonary Critical Care Medicine [place]   PULMONARY CRITICAL CARE SERVICE  PROGRESS NOTE  Date of Service: 01/20/2020  Shane Banks  OEU:[id number]  DOB: Aug 26, 1946   DOA: 11/21/2019  Referring Physician: [name], MD  HPI: Shane Banks is a 74 y.o. male seen for follow up of Acute on Chronic Respiratory Failure.  Patient currently is off the ventilator on the NAG has been on 4 L of oxygen  Medications: Reviewed on Rounds  Physical Exam:  Vitals: Temperature is 97.6 pulse 72 respiratory 36 blood pressure is 102/55 saturations 100%  Ventilator Settings off the vent on the NAG on 4 L  . General: Comfortable at this time . Eyes: Grossly normal lids, irises & conjunctiva . ENT: grossly tongue is normal . Neck: no obvious mass . Cardiovascular: S1 S2 normal no gallop . Respiratory: No rhonchi coarse breath sounds . Abdomen: soft . Skin: no rash seen on limited exam . Musculoskeletal: not rigid . Psychiatric:unable to assess . Neurologic: no seizure no involuntary movements         Lab Data:   Basic Metabolic Panel: Recent Labs  Lab 01/15/20 0706 01/16/20 0547 01/17/20 0500 01/19/20 0822  NA 137  --  135 137  K 3.4* 4.0 3.6 3.8  CL 96*  --  97* 98  CO2 31  --  29 29  GLUCOSE 159*  --  146* 138*  BUN 103*  --  101* 87*  CREATININE 2.24*  --  2.01* 1.91*  CALCIUM 9.5  --  9.4 9.6  MG 1.7 2.1 1.9 1.9  PHOS 2.2*  --  1.6* 2.8    ABG: Recent Labs  Lab 01/15/20 1140  PHART 7.296*  PCO2ART 64.9*  PO2ART 167*  HCO3 30.9*  O2SAT 99.4    Liver Function Tests: Recent Labs  Lab 01/15/20 0706 01/17/20 0500 01/19/20 0822  ALBUMIN 2.0* 2.3* 2.4*   No results for input(s): LIPASE, AMYLASE in the last 168 hours. No results for input(s): AMMONIA in the last 168 hours.  CBC: Recent Labs  Lab 01/15/20 0706 01/16/20 0547 01/17/20 0500 01/19/20 0822 01/20/20 1107  WBC 20.6* 24.2* 20.3* 21.8* 22.3*  HGB 7.0* 8.1* 7.4* 7.1* 7.5*   HCT 22.3* 25.3* 23.8* 23.4* 24.8*  MCV 87.1 84.9 85.9 87.3 86.7  PLT 259 231 227 230 227    Cardiac Enzymes: No results for input(s): CKTOTAL, CKMB, CKMBINDEX, TROPONINI in the last 168 hours.  BNP (last 3 results) No results for input(s): BNP in the last 8760 hours.  ProBNP (last 3 results) No results for input(s): PROBNP in the last 8760 hours.  Radiological Exams: No results found.  Assessment/Plan Active Problems:   Acute on chronic respiratory failure with hypoxia (HCC)   COVID-19 virus infection   Atrial fibrillation with RVR (HCC)   Severe sepsis (HCC)   Pneumonia due to COVID-19 virus   1. Acute on chronic respiratory failure with hypoxia we will continue with the NAG titrate oxygen as tolerated. 2. COVID-19 virus infection treated resolved 3. Atrial fibrillation with RVR no change we will continue to monitor 4. Severe sepsis hemodynamics are stable 5. Pneumonia due to COVID-19 treated   I have personally seen and evaluated the patient, evaluated laboratory and imaging results, formulated the assessment and plan and placed orders. The Patient requires high complexity decision making with multiple systems involvement.  Rounds were done with the Respiratory Therapy Director and Staff therapists and discussed with nursing staff also.  Allyne Gee, MD  [facility] Pulmonary Critical Care Medicine Sleep Medicine

## 2020-01-21 DIAGNOSIS — U071 COVID-19: Secondary | ICD-10-CM | POA: Diagnosis not present

## 2020-01-21 DIAGNOSIS — I4891 Unspecified atrial fibrillation: Secondary | ICD-10-CM | POA: Diagnosis not present

## 2020-01-21 DIAGNOSIS — N179 Acute kidney failure, unspecified: Secondary | ICD-10-CM | POA: Diagnosis not present

## 2020-01-21 DIAGNOSIS — J9621 Acute and chronic respiratory failure with hypoxia: Secondary | ICD-10-CM | POA: Diagnosis not present

## 2020-01-21 LAB — CULTURE, BLOOD (ROUTINE X 2)
Culture: NO GROWTH
Culture: NO GROWTH
Special Requests: ADEQUATE
Special Requests: ADEQUATE

## 2020-01-21 NOTE — Progress Notes (Signed)
Pulmonary Critical Care Medicine [place] Banks   PULMONARY CRITICAL CARE SERVICE  PROGRESS NOTE  Date of Service: 01/21/2020  Shane Banks  PNT:[id number]  DOB: [birth year]   DOA: 11/21/2019  Referring Physician: [name], MD  HPI: Shane Banks is a 73 y.o. male seen for follow up of Acute on Chronic Respiratory Failure.  Patient right now is off the ventilator on the NAG has been on 4 L of oxygen  Medications: Reviewed on Rounds  Physical Exam:  Vitals: Temperature is 98.2 pulse 69 respiratory 35 blood pressure 97/48 saturations 100%  Ventilator Settings on NAG on 4 L of oxygen  . General: Comfortable at this time . Eyes: Grossly normal lids, irises & conjunctiva . ENT: grossly tongue is normal . Neck: no obvious mass . Cardiovascular: S1 S2 normal no gallop . Respiratory: No rhonchi coarse breath sounds are noted . Abdomen: soft . Skin: no rash seen on limited exam . Musculoskeletal: not rigid . Psychiatric:unable to assess . Neurologic: no seizure no involuntary movements         Lab Data:   Basic Metabolic Panel: Recent Labs  Lab 01/15/20 0706 01/16/20 0547 01/17/20 0500 01/19/20 0822  NA 137  --  135 137  K 3.4* 4.0 3.6 3.8  CL 96*  --  97* 98  CO2 31  --  29 29  GLUCOSE 159*  --  146* 138*  BUN 103*  --  101* 87*  CREATININE 2.24*  --  2.01* 1.91*  CALCIUM 9.5  --  9.4 9.6  MG 1.7 2.1 1.9 1.9  PHOS 2.2*  --  1.6* 2.8    ABG: Recent Labs  Lab 01/15/20 1140  PHART 7.296*  PCO2ART 64.9*  PO2ART 167*  HCO3 30.9*  O2SAT 99.4    Liver Function Tests: Recent Labs  Lab 01/15/20 0706 01/17/20 0500 01/19/20 0822  ALBUMIN 2.0* 2.3* 2.4*   No results for input(s): LIPASE, AMYLASE in the last 168 hours. No results for input(s): AMMONIA in the last 168 hours.  CBC: Recent Labs  Lab 01/15/20 0706 01/16/20 0547 01/17/20 0500 01/19/20 0822 01/20/20 1107  WBC 20.6* 24.2* 20.3* 21.8* 22.3*  HGB 7.0* 8.1* 7.4* 7.1* 7.5*  HCT  22.3* 25.3* 23.8* 23.4* 24.8*  MCV 87.1 84.9 85.9 87.3 86.7  PLT 259 231 227 230 227    Cardiac Enzymes: No results for input(s): CKTOTAL, CKMB, CKMBINDEX, TROPONINI in the last 168 hours.  BNP (last 3 results) No results for input(s): BNP in the last 8760 hours.  ProBNP (last 3 results) No results for input(s): PROBNP in the last 8760 hours.  Radiological Exams: No results found.  Assessment/Plan Active Problems:   Acute on chronic respiratory failure with hypoxia (HCC)   COVID-19 virus infection   Atrial fibrillation with RVR (HCC)   Severe sepsis (HCC)   Pneumonia due to COVID-19 virus   1. Acute on chronic respiratory failure hypoxia we will continue with NAG patient is on 4 L of oxygen. 2. COVID-19 virus infection resolved 3. Atrial fibrillation RVR rate controlled 4. Severe sepsis hemodynamics are stable 5. Pneumonia due to COVID-19 treated continue to follow   I have personally seen and evaluated the patient, evaluated laboratory and imaging results, formulated the assessment and plan and placed orders. The Patient requires high complexity decision making with multiple systems involvement.  Rounds were done with the Respiratory Therapy Director and Staff therapists and discussed with nursing staff also.  Allyne Gee, MD [facility] Pulmonary Critical  Care Medicine Sleep Medicine

## 2020-01-22 ENCOUNTER — Other Ambulatory Visit (HOSPITAL_COMMUNITY): Payer: Medicare HMO

## 2020-01-22 DIAGNOSIS — U071 COVID-19: Secondary | ICD-10-CM | POA: Diagnosis not present

## 2020-01-22 DIAGNOSIS — N179 Acute kidney failure, unspecified: Secondary | ICD-10-CM | POA: Diagnosis not present

## 2020-01-22 DIAGNOSIS — J9621 Acute and chronic respiratory failure with hypoxia: Secondary | ICD-10-CM | POA: Diagnosis not present

## 2020-01-22 DIAGNOSIS — I4891 Unspecified atrial fibrillation: Secondary | ICD-10-CM | POA: Diagnosis not present

## 2020-01-22 LAB — RENAL FUNCTION PANEL
Albumin: 2.5 g/dL — ABNORMAL LOW (ref 3.5–5.0)
Anion gap: 10 (ref 5–15)
BUN: 115 mg/dL — ABNORMAL HIGH (ref 8–23)
CO2: 28 mmol/L (ref 22–32)
Calcium: 9.9 mg/dL (ref 8.9–10.3)
Chloride: 98 mmol/L (ref 98–111)
Creatinine, Ser: 2.15 mg/dL — ABNORMAL HIGH (ref 0.61–1.24)
GFR calc Af Amer: 34 mL/min — ABNORMAL LOW (ref >60)
GFR calc non Af Amer: 30 mL/min — ABNORMAL LOW (ref >60)
Glucose, Bld: 136 mg/dL — ABNORMAL HIGH (ref 70–99)
Phosphorus: 2.7 mg/dL (ref 2.5–4.6)
Potassium: 4.2 mmol/L (ref 3.5–5.1)
Sodium: 136 mmol/L (ref 135–145)

## 2020-01-22 LAB — CBC
HCT: 24.3 % — ABNORMAL LOW (ref 39.0–52.0)
Hemoglobin: 7.4 g/dL — ABNORMAL LOW (ref 13.0–17.0)
MCH: 26.4 pg (ref 26.0–34.0)
MCHC: 30.5 g/dL (ref 30.0–36.0)
MCV: 86.8 fL (ref 80.0–100.0)
Platelets: 244 10*3/uL (ref 150–400)
RBC: 2.8 MIL/uL — ABNORMAL LOW (ref 4.22–5.81)
RDW: 18 % — ABNORMAL HIGH (ref 11.5–15.5)
WBC: 24.1 10*3/uL — ABNORMAL HIGH (ref 4.0–10.5)
nRBC: 0 % (ref 0.0–0.2)

## 2020-01-22 LAB — MAGNESIUM: Magnesium: 2.1 mg/dL (ref 1.7–2.4)

## 2020-01-22 NOTE — Progress Notes (Signed)
Pulmonary Critical Care Medicine [place]   PULMONARY CRITICAL CARE SERVICE  PROGRESS NOTE  Date of Service: 01/22/2020  Kenshawn Maciolek  QQV:[id number]  DOB: 09-16-1946   DOA: 11/21/2019  Referring Physician: [name], MD  HPI: Shane Banks is a 74 y.o. male seen for follow up of Acute on Chronic Respiratory Failure.  Apparently had some desaturations over the weekend now is on the ventilator on assist control mode FiO2 is back down to 55%  Medications: Reviewed on Rounds  Physical Exam:  Vitals: Temperature is 99.9 pulse 69 respiratory 22 blood pressure is 113/88 saturations 100%  Ventilator Settings on assist control FiO2 55% tidal volume 350 PEEP 7  . General: Comfortable at this time . Eyes: Grossly normal lids, irises & conjunctiva . ENT: grossly tongue is normal . Neck: no obvious mass . Cardiovascular: S1 S2 normal no gallop . Respiratory: No rhonchi coarse breath sounds are noted . Abdomen: soft . Skin: no rash seen on limited exam . Musculoskeletal: not rigid . Psychiatric:unable to assess . Neurologic: no seizure no involuntary movements         Lab Data:   Basic Metabolic Panel: Recent Labs  Lab 01/16/20 0547 01/17/20 0500 01/19/20 0822 01/22/20 0742  NA  --  135 137 136  K 4.0 3.6 3.8 4.2  CL  --  97* 98 98  CO2  --  29 29 28   GLUCOSE  --  146* 138* 136*  BUN  --  101* 87* 115*  CREATININE  --  2.01* 1.91* 2.15*  CALCIUM  --  9.4 9.6 9.9  MG 2.1 1.9 1.9 2.1  PHOS  --  1.6* 2.8 2.7    ABG: Recent Labs  Lab 01/15/20 1140  PHART 7.296*  PCO2ART 64.9*  PO2ART 167*  HCO3 30.9*  O2SAT 99.4    Liver Function Tests: Recent Labs  Lab 01/17/20 0500 01/19/20 0822 01/22/20 0742  ALBUMIN 2.3* 2.4* 2.5*   No results for input(s): LIPASE, AMYLASE in the last 168 hours. No results for input(s): AMMONIA in the last 168 hours.  CBC: Recent Labs  Lab 01/16/20 0547 01/17/20 0500 01/19/20 0822 01/20/20 1107  01/22/20 0742  WBC 24.2* 20.3* 21.8* 22.3* 24.1*  HGB 8.1* 7.4* 7.1* 7.5* 7.4*  HCT 25.3* 23.8* 23.4* 24.8* 24.3*  MCV 84.9 85.9 87.3 86.7 86.8  PLT 231 227 230 227 244    Cardiac Enzymes: No results for input(s): CKTOTAL, CKMB, CKMBINDEX, TROPONINI in the last 168 hours.  BNP (last 3 results) No results for input(s): BNP in the last 8760 hours.  ProBNP (last 3 results) No results for input(s): PROBNP in the last 8760 hours.  Radiological Exams: DG Chest Port 1 View  Result Date: 01/22/2020 CLINICAL DATA:  Pneumonia EXAM: PORTABLE CHEST 1 VIEW COMPARISON:  January 11, 2020. FINDINGS: Tracheostomy tube tip seen at the level of the clavicular heads. A right-sided central venous catheter the tip just entering the right atrium. Again noted are diffusely increased coarsened interstitial markings and patchy airspace opacities throughout both lungs. There is slight interval worsening seen within the right upper lung. There is however improvement of the airspace opacities within the left lung. Again noted is a probable left and trace right pleural effusion present. There is a retrocardiac opacity with air bronchogram seen. No acute osseous abnormality. IMPRESSION: No multifocal airspace opacities with slight interval improvement in the left upper lung, however slight interval worsening in the right lung. Small left and trace right pleural effusion  Stable lines and tubes Electronically Signed   By: Prudencio Pair M.D.   On: 01/22/2020 06:49    Assessment/Plan Active Problems:   Acute on chronic respiratory failure with hypoxia (HCC)   COVID-19 virus infection   Atrial fibrillation with RVR (HCC)   Severe sepsis (HCC)   Pneumonia due to COVID-19 virus   1. Acute on chronic respiratory failure with hypoxia patient is going to be reassessed again for weaning will continue to follow along 2. COVID-19 virus infection at baseline we will continue with supportive care 3. Atrial fibrillation with RVR  at baseline we will continue to follow 4. Severe sepsis resolved 5. Pneumonia due to COVID-19 clinically is improving however had some desaturations likely residual damage however the chest x-ray does show improvement in the upper lung with slight worsening of the right lung right need further fluid removal   I have personally seen and evaluated the patient, evaluated laboratory and imaging results, formulated the assessment and plan and placed orders. The Patient requires high complexity decision making with multiple systems involvement.  Rounds were done with the Respiratory Therapy Director and Staff therapists and discussed with nursing staff also.  Allyne Gee, MD [facility] Pulmonary Critical Care Medicine Sleep Medicine

## 2020-01-22 NOTE — Progress Notes (Signed)
Let me know [facility]  ROUNDING NOTE   Subjective:  Patient seen and evaluated during hemodialysis treatment today. His respiratory status has worsened. FiO2 was initially increased to 100% after he was moved to a chair. Ultrafiltration target increased to 3.5 kg.  Objective:  Vital signs in last 24 hours:  Temperature 99 pulse 77 respirations 39 blood pressure 175/80  Physical Exam: General: Critically ill-appearing  Head: Normocephalic, atraumatic. Moist oral mucosal membranes  Eyes: Anicteric  Neck: Tracheostomy in place  Lungs:  Scattered rhonchi, vent assisted, tachypneic  Heart: S1S2 no rubs  Abdomen:  Soft, nontender, bowel sounds present, PEG tube in place  Extremities: 3+ peripheral edema  Neurologic: Awake, alert, will follow simple commands  Skin: No rash  Access: Right IJ temporary dialysis catheter    Basic Metabolic Panel: Recent Labs  Lab 01/16/20 0547 01/17/20 0500 01/19/20 0822  NA  --  135 137  K 4.0 3.6 3.8  CL  --  97* 98  CO2  --  29 29  GLUCOSE  --  146* 138*  BUN  --  101* 87*  CREATININE  --  2.01* 1.91*  CALCIUM  --  9.4 9.6  MG 2.1 1.9 1.9  PHOS  --  1.6* 2.8    Liver Function Tests: Recent Labs  Lab 01/17/20 0500 01/19/20 0822  ALBUMIN 2.3* 2.4*   No results for input(s): LIPASE, AMYLASE in the last 168 hours. No results for input(s): AMMONIA in the last 168 hours.  CBC: Recent Labs  Lab 01/16/20 0547 01/17/20 0500 01/19/20 0822 01/20/20 1107 01/22/20 0742  WBC 24.2* 20.3* 21.8* 22.3* 24.1*  HGB 8.1* 7.4* 7.1* 7.5* 7.4*  HCT 25.3* 23.8* 23.4* 24.8* 24.3*  MCV 84.9 85.9 87.3 86.7 86.8  PLT 231 227 230 227 244    Cardiac Enzymes: No results for input(s): CKTOTAL, CKMB, CKMBINDEX, TROPONINI in the last 168 hours.  BNP: Invalid input(s): POCBNP  CBG: No results for input(s): GLUCAP in the last 168 hours.  Microbiology: Results for orders placed or performed during the hospital encounter of 11/21/19   Culture, Urine     Status: Abnormal   Collection Time: 11/22/19  1:25 PM   Specimen: Urine, Random  Result Value Ref Range Status   Specimen Description URINE, RANDOM  Final   Special Requests   Final    NONE Performed at [facility], [address] [place], [place] [address]    Culture 50,000 COLONIES/mL YEAST (A)  Final   Report Status 11/23/2019 FINAL  Final  Culture, blood (routine x 2)     Status: None   Collection Time: 11/22/19  1:40 PM   Specimen: BLOOD LEFT HAND  Result Value Ref Range Status   Specimen Description BLOOD LEFT HAND  Final   Special Requests   Final    BOTTLES DRAWN AEROBIC ONLY Blood Culture results may not be optimal due to an inadequate volume of blood received in culture bottles   Culture   Final    NO GROWTH 5 DAYS Performed at [facility], [place] [address], [place], [place] [address]    Report Status 11/27/2019 FINAL  Final  Culture, blood (routine x 2)     Status: None   Collection Time: 11/22/19  1:44 PM   Specimen: BLOOD LEFT HAND  Result Value Ref Range Status   Specimen Description BLOOD LEFT HAND  Final   Special Requests   Final    BOTTLES DRAWN AEROBIC ONLY  Blood Culture results may not be optimal due to an inadequate volume of blood received in culture bottles   Culture   Final    NO GROWTH 5 DAYS Performed at [facility] [address] [place], [place] [address]    Report Status 11/27/2019 FINAL  Final  Culture, respiratory (non-expectorated)     Status: None   Collection Time: 11/22/19  4:59 PM   Specimen: Tracheal Aspirate; Respiratory  Result Value Ref Range Status   Specimen Description TRACHEAL ASPIRATE  Final   Special Requests NONE  Final   Gram Stain   Final    ABUNDANT WBC PRESENT, PREDOMINANTLY PMN NO ORGANISMS SEEN    Culture   Final    NO GROWTH 2 DAYS Performed at [facility], [address], [place], [place] [address]    Report Status 11/24/2019 FINAL  Final  Culture, respiratory  (non-expectorated)     Status: None   Collection Time: 12/04/19  2:22 AM   Specimen: Tracheal Aspirate; Respiratory  Result Value Ref Range Status   Specimen Description TRACHEAL ASPIRATE  Final   Special Requests NONE  Final   Gram Stain   Final    ABUNDANT WBC PRESENT, PREDOMINANTLY PMN ABUNDANT GRAM NEGATIVE RODS Performed at [facility], [address], [place], [place] [address]    Culture   Final    MODERATE PSEUDOMONAS AERUGINOSA FEW ESCHERICHIA COLI Confirmed Extended Spectrum Beta-Lactamase Producer (ESBL).  In bloodstream infections from ESBL organisms, carbapenems are preferred over piperacillin/tazobactam. They are shown to have a lower risk of mortality.    Report Status 12/09/2019 FINAL  Final   Organism ID, Bacteria PSEUDOMONAS AERUGINOSA  Final   Organism ID, Bacteria ESCHERICHIA COLI  Final      Susceptibility   Escherichia coli - MIC*    AMPICILLIN >=32 RESISTANT Resistant     CEFAZOLIN >=64 RESISTANT Resistant     CEFEPIME 2 SENSITIVE Sensitive     CEFTAZIDIME RESISTANT Resistant     CEFTRIAXONE >=64 RESISTANT Resistant     CIPROFLOXACIN <=0.25 SENSITIVE Sensitive     GENTAMICIN >=16 RESISTANT Resistant     IMIPENEM <=0.25 SENSITIVE Sensitive     TRIMETH/SULFA >=320 RESISTANT Resistant     AMPICILLIN/SULBACTAM 16 INTERMEDIATE Intermediate     PIP/TAZO <=4 SENSITIVE Sensitive     * FEW ESCHERICHIA COLI   Pseudomonas aeruginosa - MIC*    CEFTAZIDIME 16 INTERMEDIATE Intermediate     CIPROFLOXACIN >=4 RESISTANT Resistant     GENTAMICIN 2 SENSITIVE Sensitive     IMIPENEM >=16 RESISTANT Resistant     * MODERATE PSEUDOMONAS AERUGINOSA  Culture, Urine     Status: Abnormal   Collection Time: 12/04/19  6:33 PM   Specimen: Urine, Random  Result Value Ref Range Status   Specimen Description URINE, RANDOM  Final   Special Requests   Final    NONE Performed at [facility], [address] [place], [place] [address]    Culture >=100,000 COLONIES/mL  PSEUDOMONAS AERUGINOSA (A)  Final   Report Status 12/06/2019 FINAL  Final   Organism ID, Bacteria PSEUDOMONAS AERUGINOSA (A)  Final      Susceptibility   Pseudomonas aeruginosa - MIC*    CEFTAZIDIME 4 SENSITIVE Sensitive     CIPROFLOXACIN >=4 RESISTANT Resistant     GENTAMICIN 4 SENSITIVE Sensitive     IMIPENEM 2 SENSITIVE Sensitive     PIP/TAZO 16 SENSITIVE Sensitive     * >=100,000 COLONIES/mL  PSEUDOMONAS AERUGINOSA  Culture, blood (routine x 2)     Status: Abnormal   Collection Time: 12/11/19  9:39 AM   Specimen: BLOOD LEFT HAND  Result Value Ref Range Status   Specimen Description BLOOD LEFT HAND  Final   Special Requests   Final    BOTTLES DRAWN AEROBIC AND ANAEROBIC Blood Culture adequate volume   Culture  Setup Time   Final    GRAM POSITIVE COCCI IN CLUSTERS ANAEROBIC BOTTLE ONLY CRITICAL RESULT CALLED TO, READ BACK BY AND VERIFIED WITH: RN R [place] [id number] AT 52 BY CM    Culture (A)  Final    STAPHYLOCOCCUS SPECIES (COAGULASE NEGATIVE) THE SIGNIFICANCE OF ISOLATING THIS ORGANISM FROM A SINGLE SET OF BLOOD CULTURES WHEN MULTIPLE SETS ARE DRAWN IS UNCERTAIN. PLEASE NOTIFY THE MICROBIOLOGY DEPARTMENT WITHIN ONE WEEK IF SPECIATION AND SENSITIVITIES ARE REQUIRED. Performed at [facility], [place] [address] [place], [place] [address]    Report Status 12/14/2019 FINAL  Final  Culture, blood (routine x 2)     Status: None   Collection Time: 12/11/19  9:47 AM   Specimen: BLOOD  Result Value Ref Range Status   Specimen Description BLOOD RIGHT ANTECUBITAL  Final   Special Requests   Final    BOTTLES DRAWN AEROBIC AND ANAEROBIC Blood Culture adequate volume   Culture  Setup Time   Final    CORRECTED RESULTS NO ORGANISMS SEEN PREVIOUSLY REPORTED AS: GRAM POSITIVE COCCI IN CLUSTERS CORRECTED RESULTS CALLED TO: RN R [place] [id number] AT 920 AM BY CM    Culture   Final    NO GROWTH 5 DAYS Performed at [facility], [place] [address] [place], [place] [address]     Report Status 12/16/2019 FINAL  Final  Culture, respiratory (non-expectorated)     Status: None   Collection Time: 12/11/19 10:27 AM   Specimen: Tracheal Aspirate; Respiratory  Result Value Ref Range Status   Specimen Description TRACHEAL ASPIRATE  Final   Special Requests NONE  Final   Gram Stain NO WBC SEEN NO ORGANISMS SEEN   Final   Culture   Final    FEW PSEUDOMONAS AERUGINOSA FEW STENOTROPHOMONAS MALTOPHILIA SEE SEPARATE REPORT IN [facility] FOR DOS 12/11/19. Performed at [facility], [place] [address], [place], [place] [address]    Report Status 12/26/2019 FINAL  Final   Organism ID, Bacteria PSEUDOMONAS AERUGINOSA  Final   Organism ID, Bacteria STENOTROPHOMONAS MALTOPHILIA  Final      Susceptibility   Pseudomonas aeruginosa - MIC*    CEFTAZIDIME 16 INTERMEDIATE Intermediate     CIPROFLOXACIN >=4 RESISTANT Resistant     GENTAMICIN 2 SENSITIVE Sensitive     IMIPENEM >=16 RESISTANT Resistant     * FEW PSEUDOMONAS AERUGINOSA   Stenotrophomonas maltophilia - MIC*    LEVOFLOXACIN 0.5 SENSITIVE Sensitive     TRIMETH/SULFA <=20 SENSITIVE Sensitive     * FEW STENOTROPHOMONAS MALTOPHILIA  Culture, Urine     Status: Abnormal   Collection Time: 12/11/19 11:30 AM   Specimen: Urine, Random  Result Value Ref Range Status   Specimen Description URINE, RANDOM  Final   Special Requests NONE  Final   Culture (A)  Final    <10,000 COLONIES/mL INSIGNIFICANT GROWTH Performed at [facility], [place] [address], [place], [place] [address]    Report Status 12/12/2019 FINAL  Final  Culture, blood (routine x 2)     Status: None   Collection Time: 12/15/19 12:37 PM  Specimen: BLOOD LEFT HAND  Result Value Ref Range Status   Specimen Description BLOOD LEFT HAND  Final   Special Requests   Final    BOTTLES DRAWN AEROBIC AND ANAEROBIC Blood Culture adequate volume   Culture   Final    NO GROWTH 5 DAYS Performed at [facility], [address] [place], [place] [address]    Report  Status 12/20/2019 FINAL  Final  Culture, blood (routine x 2)     Status: None   Collection Time: 12/15/19 12:42 PM   Specimen: BLOOD LEFT HAND  Result Value Ref Range Status   Specimen Description BLOOD LEFT HAND  Final   Special Requests   Final    BOTTLES DRAWN AEROBIC AND ANAEROBIC Blood Culture adequate volume   Culture   Final    NO GROWTH 5 DAYS Performed at [facility], [place] [address], [place], [place] [address]    Report Status 12/20/2019 FINAL  Final  C Difficile Quick Screen w PCR reflex     Status: None   Collection Time: 12/17/19  7:00 PM  Result Value Ref Range Status   C Diff antigen NEGATIVE NEGATIVE Final   C Diff toxin NEGATIVE NEGATIVE Final   C Diff interpretation No C. difficile detected.  Final    Comment: Performed at [facility], [place] [address] [place], [place] [address]  SARS CORONAVIRUS 2 (TAT 6-24 HRS) Nasopharyngeal Nasopharyngeal Swab     Status: None   Collection Time: 12/20/19 11:45 AM   Specimen: Nasopharyngeal Swab  Result Value Ref Range Status   SARS Coronavirus 2 NEGATIVE NEGATIVE Final    Comment: (NOTE) SARS-CoV-2 target nucleic acids are NOT DETECTED. The SARS-CoV-2 RNA is generally detectable in upper and lower respiratory specimens during the acute phase of infection. Negative results do not preclude SARS-CoV-2 infection, do not rule out co-infections with other pathogens, and should not be used as the sole basis for treatment or other patient management decisions. Negative results must be combined with clinical observations, patient history, and epidemiological information. The expected result is Negative. Fact Sheet for Patients: SugarRoll.be Fact Sheet for Healthcare Providers: https://www.woods-mathews.com/ This test is not yet approved or cleared by the [place] FDA and  has been authorized for detection and/or diagnosis of SARS-CoV-2 by FDA under an Emergency Use  Authorization (EUA). This EUA will remain  in effect (meaning this test can be used) for the duration of the COVID-19 declaration under Section 56 4(b)(1) of the Act, 21 U.S.C. section 360bbb-3(b)(1), unless the authorization is terminated or revoked sooner. Performed at [facility], [place] [address] [place], [place] [address]   Body fluid culture     Status: None   Collection Time: 12/21/19 12:10 PM   Specimen: Lung, Left; Pleural Fluid  Result Value Ref Range Status   Specimen Description PLEURAL FLUID  Final   Special Requests LEFT LUNG THORA  Final   Gram Stain   Final    RARE WBC PRESENT, PREDOMINANTLY MONONUCLEAR NO ORGANISMS SEEN    Culture   Final    NO GROWTH 3 DAYS Performed at [facility], [place] [address] [place], [place] [address]    Report Status 12/24/2019 FINAL  Final  Culture, blood (routine x 2)     Status: None   Collection Time: 12/25/19  2:16 PM   Specimen: BLOOD RIGHT HAND  Result Value Ref Range Status   Specimen Description BLOOD RIGHT HAND  Final  Special Requests   Final    BOTTLES DRAWN AEROBIC ONLY Blood Culture adequate volume   Culture   Final    NO GROWTH 5 DAYS Performed at [facility], [place] [address], [place], [place] [address]    Report Status 01/01/2020 FINAL  Final  Culture, blood (routine x 2)     Status: None   Collection Time: 12/25/19  2:18 PM   Specimen: BLOOD LEFT HAND  Result Value Ref Range Status   Specimen Description BLOOD LEFT HAND  Final   Special Requests   Final    BOTTLES DRAWN AEROBIC ONLY Blood Culture adequate volume   Culture   Final    NO GROWTH 5 DAYS Performed at [facility], [place] [address], [place], [place] [address]    Report Status 01/01/2020 FINAL  Final  Culture, respiratory (non-expectorated)     Status: None   Collection Time: 12/25/19  4:20 PM   Specimen: Tracheal Aspirate; Respiratory  Result Value Ref Range Status   Specimen Description TRACHEAL ASPIRATE  Final   Special  Requests NONE  Final   Gram Stain   Final    ABUNDANT WBC PRESENT, PREDOMINANTLY PMN NO ORGANISMS SEEN    Culture   Final    RARE Consistent with normal respiratory flora. Performed at [facility], [place] [address] [place], [place] [address]    Report Status 12/28/2019 FINAL  Final  Culture, respiratory (non-expectorated)     Status: None   Collection Time: 01/12/20  4:14 PM   Specimen: Tracheal Aspirate; Respiratory  Result Value Ref Range Status   Specimen Description TRACHEAL ASPIRATE  Final   Special Requests NONE  Final   Gram Stain   Final    ABUNDANT WBC PRESENT, PREDOMINANTLY PMN ABUNDANT GRAM NEGATIVE RODS RARE GRAM POSITIVE RODS Performed at [facility], [place] [address] [place], [place] [address]    Culture   Final    ABUNDANT PSEUDOMONAS AERUGINOSA FEW STENOTROPHOMONAS MALTOPHILIA    Report Status 01/16/2020 FINAL  Final   Organism ID, Bacteria PSEUDOMONAS AERUGINOSA  Final   Organism ID, Bacteria STENOTROPHOMONAS MALTOPHILIA  Final      Susceptibility   Pseudomonas aeruginosa - MIC*    CEFTAZIDIME 16 INTERMEDIATE Intermediate     CIPROFLOXACIN >=4 RESISTANT Resistant     GENTAMICIN 2 SENSITIVE Sensitive     IMIPENEM 2 SENSITIVE Sensitive     * ABUNDANT PSEUDOMONAS AERUGINOSA   Stenotrophomonas maltophilia - MIC*    LEVOFLOXACIN 0.5 SENSITIVE Sensitive     TRIMETH/SULFA <=20 SENSITIVE Sensitive     * FEW STENOTROPHOMONAS MALTOPHILIA  Culture, blood (Routine X 2) w Reflex to ID Panel     Status: None   Collection Time: 01/16/20  3:30 PM   Specimen: BLOOD LEFT HAND  Result Value Ref Range Status   Specimen Description BLOOD LEFT HAND  Final   Special Requests   Final    BOTTLES DRAWN AEROBIC AND ANAEROBIC Blood Culture adequate volume   Culture   Final    NO GROWTH 5 DAYS Performed at [facility], [address], [place], [place] [address]    Report Status 01/21/2020 FINAL  Final  Culture, blood (Routine X 2) w Reflex to ID Panel      Status: None   Collection Time: 01/16/20  3:32 PM   Specimen: BLOOD LEFT ARM  Result Value Ref Range Status   Specimen Description BLOOD LEFT ARM  Final  Special Requests   Final    BOTTLES DRAWN AEROBIC AND ANAEROBIC Blood Culture adequate volume   Culture   Final    NO GROWTH 5 DAYS Performed at [facility], [place] [address], [place], [place] [address]    Report Status 01/21/2020 FINAL  Final    Coagulation Studies: No results for input(s): LABPROT, INR in the last 72 hours.  Urinalysis: No results for input(s): COLORURINE, LABSPEC, PHURINE, GLUCOSEU, HGBUR, BILIRUBINUR, KETONESUR, PROTEINUR, UROBILINOGEN, NITRITE, LEUKOCYTESUR in the last 72 hours.  Invalid input(s): APPERANCEUR    Imaging: DG Chest Port 1 View  Result Date: 01/22/2020 CLINICAL DATA:  Pneumonia EXAM: PORTABLE CHEST 1 VIEW COMPARISON:  January 11, 2020. FINDINGS: Tracheostomy tube tip seen at the level of the clavicular heads. A right-sided central venous catheter the tip just entering the right atrium. Again noted are diffusely increased coarsened interstitial markings and patchy airspace opacities throughout both lungs. There is slight interval worsening seen within the right upper lung. There is however improvement of the airspace opacities within the left lung. Again noted is a probable left and trace right pleural effusion present. There is a retrocardiac opacity with air bronchogram seen. No acute osseous abnormality. IMPRESSION: No multifocal airspace opacities with slight interval improvement in the left upper lung, however slight interval worsening in the right lung. Small left and trace right pleural effusion Stable lines and tubes Electronically Signed   By: Prudencio Pair M.D.   On: 01/22/2020 06:49     Medications:     heparin sodium (porcine), lidocaine, lidocaine  Assessment/ Plan:  74 y.o. male with a PMHx of acute respiratory failure status post COVID-19 infection status post tracheostomy  placement, atrial fibrillation, hypertension, CVA, gout, hypertrophic cardiomyopathy, who was admitted to [facility] on 11/21/2019 for ongoing treatment of acute respiratory failure.  1.  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  Patient seen during dialysis treatment.  We increased ultrafiltration target during the treatment today to 3.5 kg as he has increasing shortness of breath and ongoing edema.  2.  Acute respiratory failure secondary to COVID-19 infection and its sequela.  -Patient moved to chair for dialysis treatment which caused patient to be significantly short of breath.  FiO2 initially increased to 100%.  As above UF target increased to 3.5 kg.  3.  Hyperkalemia.  Potassium 3.8 at last check.  4.  Hyponatremia.  Serum sodium improved last week up to 137.  Continue to monitor serum sodium.  5.  Anemia of chronic kidney disease.  Hemoglobin remains in the low sevens.  Consider transfusion for hemoglobin of 7 or less.       LOS: 0 Bartt Gonzaga 4/26/20218:08 AM

## 2020-01-23 DIAGNOSIS — U071 COVID-19: Secondary | ICD-10-CM | POA: Diagnosis not present

## 2020-01-23 DIAGNOSIS — J9621 Acute and chronic respiratory failure with hypoxia: Secondary | ICD-10-CM | POA: Diagnosis not present

## 2020-01-23 DIAGNOSIS — I4891 Unspecified atrial fibrillation: Secondary | ICD-10-CM | POA: Diagnosis not present

## 2020-01-23 DIAGNOSIS — N179 Acute kidney failure, unspecified: Secondary | ICD-10-CM | POA: Diagnosis not present

## 2020-01-23 NOTE — Progress Notes (Signed)
Pulmonary Critical Care Medicine [place]   PULMONARY CRITICAL CARE SERVICE  PROGRESS NOTE  Date of Service: 01/23/2020  Shane Banks  TML:[id number]  DOB: 10-23-1945   DOA: 11/21/2019  Referring Physician: [name], MD  HPI: Shane Banks is a 74 y.o. male seen for follow up of Acute on Chronic Respiratory Failure.  Patient currently is off the ventilator on the NAG using 5 L of oxygen  Medications: Reviewed on Rounds  Physical Exam:  Vitals: Temperature is 96.4 pulse 66 respiratory rate 24 blood pressure is 110/52 saturations 99%  Ventilator Settings off the ventilator on the NAG 5 L of oxygen  . General: Comfortable at this time . Eyes: Grossly normal lids, irises & conjunctiva . ENT: grossly tongue is normal . Neck: no obvious mass . Cardiovascular: S1 S2 normal no gallop . Respiratory: No rhonchi no rales are noted at this time . Abdomen: soft . Skin: no rash seen on limited exam . Musculoskeletal: not rigid . Psychiatric:unable to assess . Neurologic: no seizure no involuntary movements         Lab Data:   Basic Metabolic Panel: Recent Labs  Lab 01/17/20 0500 01/19/20 0822 01/22/20 0742  NA 135 137 136  K 3.6 3.8 4.2  CL 97* 98 98  CO2 29 29 28   GLUCOSE 146* 138* 136*  BUN 101* 87* 115*  CREATININE 2.01* 1.91* 2.15*  CALCIUM 9.4 9.6 9.9  MG 1.9 1.9 2.1  PHOS 1.6* 2.8 2.7    ABG: No results for input(s): PHART, PCO2ART, PO2ART, HCO3, O2SAT in the last 168 hours.  Liver Function Tests: Recent Labs  Lab 01/17/20 0500 01/19/20 0822 01/22/20 0742  ALBUMIN 2.3* 2.4* 2.5*   No results for input(s): LIPASE, AMYLASE in the last 168 hours. No results for input(s): AMMONIA in the last 168 hours.  CBC: Recent Labs  Lab 01/17/20 0500 01/19/20 0822 01/20/20 1107 01/22/20 0742  WBC 20.3* 21.8* 22.3* 24.1*  HGB 7.4* 7.1* 7.5* 7.4*  HCT 23.8* 23.4* 24.8* 24.3*  MCV 85.9 87.3 86.7 86.8  PLT 227 230 227 244    Cardiac  Enzymes: No results for input(s): CKTOTAL, CKMB, CKMBINDEX, TROPONINI in the last 168 hours.  BNP (last 3 results) No results for input(s): BNP in the last 8760 hours.  ProBNP (last 3 results) No results for input(s): PROBNP in the last 8760 hours.  Radiological Exams: DG Chest Port 1 View  Result Date: 01/22/2020 CLINICAL DATA:  Pneumonia EXAM: PORTABLE CHEST 1 VIEW COMPARISON:  January 11, 2020. FINDINGS: Tracheostomy tube tip seen at the level of the clavicular heads. A right-sided central venous catheter the tip just entering the right atrium. Again noted are diffusely increased coarsened interstitial markings and patchy airspace opacities throughout both lungs. There is slight interval worsening seen within the right upper lung. There is however improvement of the airspace opacities within the left lung. Again noted is a probable left and trace right pleural effusion present. There is a retrocardiac opacity with air bronchogram seen. No acute osseous abnormality. IMPRESSION: No multifocal airspace opacities with slight interval improvement in the left upper lung, however slight interval worsening in the right lung. Small left and trace right pleural effusion Stable lines and tubes Electronically Signed   By: Prudencio Pair M.D.   On: 01/22/2020 06:49    Assessment/Plan Active Problems:   Acute on chronic respiratory failure with hypoxia (HCC)   COVID-19 virus infection   Atrial fibrillation with RVR (HCC)   Severe  sepsis ([place])   Pneumonia due to COVID-19 virus   1. Acute on chronic respiratory failure with hypoxia we will continue with weaning off the vent during the daytime rest at night 2. COVID-19 virus infection resolved we will continue with supportive care 3. Atrial fibrillation rate controlled 4. Severe sepsis hemodynamics are stable 5. Pneumonia due to COVID-19 treated improved   I have personally seen and evaluated the patient, evaluated laboratory and imaging results,  formulated the assessment and plan and placed orders. The Patient requires high complexity decision making with multiple systems involvement.  Rounds were done with the Respiratory Therapy Director and Staff therapists and discussed with nursing staff also.  Allyne Gee, MD [facility] Pulmonary Critical Care Medicine Sleep Medicine

## 2020-01-24 DIAGNOSIS — J9621 Acute and chronic respiratory failure with hypoxia: Secondary | ICD-10-CM | POA: Diagnosis not present

## 2020-01-24 DIAGNOSIS — N179 Acute kidney failure, unspecified: Secondary | ICD-10-CM | POA: Diagnosis not present

## 2020-01-24 DIAGNOSIS — I4891 Unspecified atrial fibrillation: Secondary | ICD-10-CM | POA: Diagnosis not present

## 2020-01-24 DIAGNOSIS — U071 COVID-19: Secondary | ICD-10-CM | POA: Diagnosis not present

## 2020-01-24 LAB — MAGNESIUM: Magnesium: 1.8 mg/dL (ref 1.7–2.4)

## 2020-01-24 LAB — RENAL FUNCTION PANEL
Albumin: 2.6 g/dL — ABNORMAL LOW (ref 3.5–5.0)
Anion gap: 12 (ref 5–15)
BUN: 109 mg/dL — ABNORMAL HIGH (ref 8–23)
CO2: 28 mmol/L (ref 22–32)
Calcium: 10.2 mg/dL (ref 8.9–10.3)
Chloride: 99 mmol/L (ref 98–111)
Creatinine, Ser: 2.13 mg/dL — ABNORMAL HIGH (ref 0.61–1.24)
GFR calc Af Amer: 35 mL/min — ABNORMAL LOW (ref >60)
GFR calc non Af Amer: 30 mL/min — ABNORMAL LOW (ref >60)
Glucose, Bld: 165 mg/dL — ABNORMAL HIGH (ref 70–99)
Phosphorus: 2.6 mg/dL (ref 2.5–4.6)
Potassium: 4 mmol/L (ref 3.5–5.1)
Sodium: 139 mmol/L (ref 135–145)

## 2020-01-24 LAB — CBC
HCT: 24.8 % — ABNORMAL LOW (ref 39.0–52.0)
Hemoglobin: 7.6 g/dL — ABNORMAL LOW (ref 13.0–17.0)
MCH: 26.5 pg (ref 26.0–34.0)
MCHC: 30.6 g/dL (ref 30.0–36.0)
MCV: 86.4 fL (ref 80.0–100.0)
Platelets: 242 10*3/uL (ref 150–400)
RBC: 2.87 MIL/uL — ABNORMAL LOW (ref 4.22–5.81)
RDW: 17.8 % — ABNORMAL HIGH (ref 11.5–15.5)
WBC: 23.2 10*3/uL — ABNORMAL HIGH (ref 4.0–10.5)
nRBC: 0 % (ref 0.0–0.2)

## 2020-01-24 NOTE — Progress Notes (Signed)
Let me know [facility]  ROUNDING NOTE   Subjective:  Patient currently weaned off of the ventilator momentarily. Appears to be breathing comfortably. Due for hemodialysis today.  Objective:  Vital signs in last 24 hours:  Temperature 9 9 pulse 81 respirations 46 blood pressure 150/59  Physical Exam: General: Critically ill-appearing  Head: Normocephalic, atraumatic. Moist oral mucosal membranes  Eyes: Anicteric  Neck: Tracheostomy in place  Lungs:  Scattered rhonchi, off vent at the moment  Heart: S1S2 no rubs  Abdomen:  Soft, nontender, bowel sounds present, PEG tube in place  Extremities: 2+ peripheral edema  Neurologic: Awake, alert, will follow simple commands  Skin: No rash  Access: Right IJ temporary dialysis catheter    Basic Metabolic Panel: Recent Labs  Lab 01/19/20 0822 01/22/20 0742 01/24/20 0641  NA 137 136 139  K 3.8 4.2 4.0  CL 98 98 99  CO2 29 28 28   GLUCOSE 138* 136* 165*  BUN 87* 115* 109*  CREATININE 1.91* 2.15* 2.13*  CALCIUM 9.6 9.9 10.2  MG 1.9 2.1 1.8  PHOS 2.8 2.7 2.6    Liver Function Tests: Recent Labs  Lab 01/19/20 0822 01/22/20 0742 01/24/20 0641  ALBUMIN 2.4* 2.5* 2.6*   No results for input(s): LIPASE, AMYLASE in the last 168 hours. No results for input(s): AMMONIA in the last 168 hours.  CBC: Recent Labs  Lab 01/19/20 0822 01/20/20 1107 01/22/20 0742 01/24/20 0641  WBC 21.8* 22.3* 24.1* 23.2*  HGB 7.1* 7.5* 7.4* 7.6*  HCT 23.4* 24.8* 24.3* 24.8*  MCV 87.3 86.7 86.8 86.4  PLT 230 227 244 242    Cardiac Enzymes: No results for input(s): CKTOTAL, CKMB, CKMBINDEX, TROPONINI in the last 168 hours.  BNP: Invalid input(s): POCBNP  CBG: No results for input(s): GLUCAP in the last 168 hours.  Microbiology: Results for orders placed or performed during the hospital encounter of 11/21/19  Culture, Urine     Status: Abnormal   Collection Time: 11/22/19  1:25 PM   Specimen: Urine, Random  Result Value  Ref Range Status   Specimen Description URINE, RANDOM  Final   Special Requests   Final    NONE Performed at [facility], [address], [place], [place] [address]    Culture 50,000 COLONIES/mL YEAST (A)  Final   Report Status 11/23/2019 FINAL  Final  Culture, blood (routine x 2)     Status: None   Collection Time: 11/22/19  1:40 PM   Specimen: BLOOD LEFT HAND  Result Value Ref Range Status   Specimen Description BLOOD LEFT HAND  Final   Special Requests   Final    BOTTLES DRAWN AEROBIC ONLY Blood Culture results may not be optimal due to an inadequate volume of blood received in culture bottles   Culture   Final    NO GROWTH 5 DAYS Performed at [facility], [place] [address], [place],  [address]    Report Status 11/27/2019 FINAL  Final  Culture, blood (routine x 2)     Status: None   Collection Time: 11/22/19  1:44 PM   Specimen: BLOOD LEFT HAND  Result Value Ref Range Status   Specimen Description BLOOD LEFT HAND  Final   Special Requests   Final    BOTTLES DRAWN AEROBIC ONLY Blood Culture results may not be optimal due to an inadequate volume of blood received in culture bottles   Culture   Final    NO GROWTH 5 DAYS Performed  at [facility], [place] [address], [place], [place] [address]    Report Status 11/27/2019 FINAL  Final  Culture, respiratory (non-expectorated)     Status: None   Collection Time: 11/22/19  4:59 PM   Specimen: Tracheal Aspirate; Respiratory  Result Value Ref Range Status   Specimen Description TRACHEAL ASPIRATE  Final   Special Requests NONE  Final   Gram Stain   Final    ABUNDANT WBC PRESENT, PREDOMINANTLY PMN NO ORGANISMS SEEN    Culture   Final    NO GROWTH 2 DAYS Performed at [facility], [address] [place], [place] [address]    Report Status 11/24/2019 FINAL  Final  Culture, respiratory (non-expectorated)     Status: None   Collection Time: 12/04/19  2:22 AM   Specimen: Tracheal Aspirate; Respiratory   Result Value Ref Range Status   Specimen Description TRACHEAL ASPIRATE  Final   Special Requests NONE  Final   Gram Stain   Final    ABUNDANT WBC PRESENT, PREDOMINANTLY PMN ABUNDANT GRAM NEGATIVE RODS Performed at [facility], [address] [place], [place] [address]    Culture   Final    MODERATE PSEUDOMONAS AERUGINOSA FEW ESCHERICHIA COLI Confirmed Extended Spectrum Beta-Lactamase Producer (ESBL).  In bloodstream infections from ESBL organisms, carbapenems are preferred over piperacillin/tazobactam. They are shown to have a lower risk of mortality.    Report Status 12/09/2019 FINAL  Final   Organism ID, Bacteria PSEUDOMONAS AERUGINOSA  Final   Organism ID, Bacteria ESCHERICHIA COLI  Final      Susceptibility   Escherichia coli - MIC*    AMPICILLIN >=32 RESISTANT Resistant     CEFAZOLIN >=64 RESISTANT Resistant     CEFEPIME 2 SENSITIVE Sensitive     CEFTAZIDIME RESISTANT Resistant     CEFTRIAXONE >=64 RESISTANT Resistant     CIPROFLOXACIN <=0.25 SENSITIVE Sensitive     GENTAMICIN >=16 RESISTANT Resistant     IMIPENEM <=0.25 SENSITIVE Sensitive     TRIMETH/SULFA >=320 RESISTANT Resistant     AMPICILLIN/SULBACTAM 16 INTERMEDIATE Intermediate     PIP/TAZO <=4 SENSITIVE Sensitive     * FEW ESCHERICHIA COLI   Pseudomonas aeruginosa - MIC*    CEFTAZIDIME 16 INTERMEDIATE Intermediate     CIPROFLOXACIN >=4 RESISTANT Resistant     GENTAMICIN 2 SENSITIVE Sensitive     IMIPENEM >=16 RESISTANT Resistant     * MODERATE PSEUDOMONAS AERUGINOSA  Culture, Urine     Status: Abnormal   Collection Time: 12/04/19  6:33 PM   Specimen: Urine, Random  Result Value Ref Range Status   Specimen Description URINE, RANDOM  Final   Special Requests   Final    NONE Performed at [facility], [address] [place], [place] [address]    Culture >=100,000 COLONIES/mL PSEUDOMONAS AERUGINOSA (A)  Final   Report Status 12/06/2019 FINAL  Final   Organism ID, Bacteria PSEUDOMONAS  AERUGINOSA (A)  Final      Susceptibility   Pseudomonas aeruginosa - MIC*    CEFTAZIDIME 4 SENSITIVE Sensitive     CIPROFLOXACIN >=4 RESISTANT Resistant     GENTAMICIN 4 SENSITIVE Sensitive     IMIPENEM 2 SENSITIVE Sensitive     PIP/TAZO 16 SENSITIVE Sensitive     * >=100,000 COLONIES/mL PSEUDOMONAS AERUGINOSA  Culture, blood (routine x 2)     Status: Abnormal   Collection Time: 12/11/19  9:39 AM   Specimen: BLOOD LEFT HAND  Result Value Ref  Range Status   Specimen Description BLOOD LEFT HAND  Final   Special Requests   Final    BOTTLES DRAWN AEROBIC AND ANAEROBIC Blood Culture adequate volume   Culture  Setup Time   Final    GRAM POSITIVE COCCI IN CLUSTERS ANAEROBIC BOTTLE ONLY CRITICAL RESULT CALLED TO, READ BACK BY AND VERIFIED WITH: RN R [place] [id number] AT 91 BY CM    Culture (A)  Final    STAPHYLOCOCCUS SPECIES (COAGULASE NEGATIVE) THE SIGNIFICANCE OF ISOLATING THIS ORGANISM FROM A SINGLE SET OF BLOOD CULTURES WHEN MULTIPLE SETS ARE DRAWN IS UNCERTAIN. PLEASE NOTIFY THE MICROBIOLOGY DEPARTMENT WITHIN ONE WEEK IF SPECIATION AND SENSITIVITIES ARE REQUIRED. Performed at [facility], [place] [address], [place], [place] [address]    Report Status 12/14/2019 FINAL  Final  Culture, blood (routine x 2)     Status: None   Collection Time: 12/11/19  9:47 AM   Specimen: BLOOD  Result Value Ref Range Status   Specimen Description BLOOD RIGHT ANTECUBITAL  Final   Special Requests   Final    BOTTLES DRAWN AEROBIC AND ANAEROBIC Blood Culture adequate volume   Culture  Setup Time   Final    CORRECTED RESULTS NO ORGANISMS SEEN PREVIOUSLY REPORTED AS: GRAM POSITIVE COCCI IN CLUSTERS CORRECTED RESULTS CALLED TO: RN R [place] [id number] AT 920 AM BY CM    Culture   Final    NO GROWTH 5 DAYS Performed at [facility], [place] [address], [place], [place] [address]    Report Status 12/16/2019 FINAL  Final  Culture, respiratory (non-expectorated)     Status: None   Collection Time:  12/11/19 10:27 AM   Specimen: Tracheal Aspirate; Respiratory  Result Value Ref Range Status   Specimen Description TRACHEAL ASPIRATE  Final   Special Requests NONE  Final   Gram Stain NO WBC SEEN NO ORGANISMS SEEN   Final   Culture   Final    FEW PSEUDOMONAS AERUGINOSA FEW STENOTROPHOMONAS MALTOPHILIA SEE SEPARATE REPORT IN [facility] FOR DOS 12/11/19. Performed at [facility], [place] [address] [place], [place] [address]    Report Status 12/26/2019 FINAL  Final   Organism ID, Bacteria PSEUDOMONAS AERUGINOSA  Final   Organism ID, Bacteria STENOTROPHOMONAS MALTOPHILIA  Final      Susceptibility   Pseudomonas aeruginosa - MIC*    CEFTAZIDIME 16 INTERMEDIATE Intermediate     CIPROFLOXACIN >=4 RESISTANT Resistant     GENTAMICIN 2 SENSITIVE Sensitive     IMIPENEM >=16 RESISTANT Resistant     * FEW PSEUDOMONAS AERUGINOSA   Stenotrophomonas maltophilia - MIC*    LEVOFLOXACIN 0.5 SENSITIVE Sensitive     TRIMETH/SULFA <=20 SENSITIVE Sensitive     * FEW STENOTROPHOMONAS MALTOPHILIA  Culture, Urine     Status: Abnormal   Collection Time: 12/11/19 11:30 AM   Specimen: Urine, Random  Result Value Ref Range Status   Specimen Description URINE, RANDOM  Final   Special Requests NONE  Final   Culture (A)  Final    <10,000 COLONIES/mL INSIGNIFICANT GROWTH Performed at [facility], [place] [address], [place], [place] [address]    Report Status 12/12/2019 FINAL  Final  Culture, blood (routine x 2)     Status: None   Collection Time: 12/15/19 12:37 PM   Specimen: BLOOD LEFT HAND  Result Value Ref Range Status   Specimen Description BLOOD LEFT HAND  Final   Special Requests   Final    BOTTLES DRAWN  AEROBIC AND ANAEROBIC Blood Culture adequate volume   Culture   Final    NO GROWTH 5 DAYS Performed at [facility], [place] [address], [place], [place] [address]    Report Status 12/20/2019 FINAL  Final  Culture, blood (routine x 2)     Status: None   Collection Time: 12/15/19 12:42 PM    Specimen: BLOOD LEFT HAND  Result Value Ref Range Status   Specimen Description BLOOD LEFT HAND  Final   Special Requests   Final    BOTTLES DRAWN AEROBIC AND ANAEROBIC Blood Culture adequate volume   Culture   Final    NO GROWTH 5 DAYS Performed at [facility], [place] [address] [place], [place] [address]    Report Status 12/20/2019 FINAL  Final  C Difficile Quick Screen w PCR reflex     Status: None   Collection Time: 12/17/19  7:00 PM  Result Value Ref Range Status   C Diff antigen NEGATIVE NEGATIVE Final   C Diff toxin NEGATIVE NEGATIVE Final   C Diff interpretation No C. difficile detected.  Final    Comment: Performed at [facility], [place] [address] [place], [place] [address]  SARS CORONAVIRUS 2 (TAT 6-24 HRS) Nasopharyngeal Nasopharyngeal Swab     Status: None   Collection Time: 12/20/19 11:45 AM   Specimen: Nasopharyngeal Swab  Result Value Ref Range Status   SARS Coronavirus 2 NEGATIVE NEGATIVE Final    Comment: (NOTE) SARS-CoV-2 target nucleic acids are NOT DETECTED. The SARS-CoV-2 RNA is generally detectable in upper and lower respiratory specimens during the acute phase of infection. Negative results do not preclude SARS-CoV-2 infection, do not rule out co-infections with other pathogens, and should not be used as the sole basis for treatment or other patient management decisions. Negative results must be combined with clinical observations, patient history, and epidemiological information. The expected result is Negative. Fact Sheet for Patients: SugarRoll.be Fact Sheet for Healthcare Providers: https://www.woods-mathews.com/ This test is not yet approved or cleared by the [place] FDA and  has been authorized for detection and/or diagnosis of SARS-CoV-2 by FDA under an Emergency Use Authorization (EUA). This EUA will remain  in effect (meaning this test can be used) for the duration of the COVID-19  declaration under Section 56 4(b)(1) of the Act, 21 U.S.C. section 360bbb-3(b)(1), unless the authorization is terminated or revoked sooner. Performed at [facility], [place] [address], [place], [place] [address]   Body fluid culture     Status: None   Collection Time: 12/21/19 12:10 PM   Specimen: Lung, Left; Pleural Fluid  Result Value Ref Range Status   Specimen Description PLEURAL FLUID  Final   Special Requests LEFT LUNG THORA  Final   Gram Stain   Final    RARE WBC PRESENT, PREDOMINANTLY MONONUCLEAR NO ORGANISMS SEEN    Culture   Final    NO GROWTH 3 DAYS Performed at [facility], [place] [address] [place], [place] [address]    Report Status 12/24/2019 FINAL  Final  Culture, blood (routine x 2)     Status: None   Collection Time: 12/25/19  2:16 PM   Specimen: BLOOD RIGHT HAND  Result Value Ref Range Status   Specimen Description BLOOD RIGHT HAND  Final   Special Requests   Final    BOTTLES DRAWN AEROBIC ONLY Blood Culture adequate volume   Culture   Final    NO GROWTH 5 DAYS  Performed at [facility], [place] [address], [place], [place] [address]    Report Status 01/01/2020 FINAL  Final  Culture, blood (routine x 2)     Status: None   Collection Time: 12/25/19  2:18 PM   Specimen: BLOOD LEFT HAND  Result Value Ref Range Status   Specimen Description BLOOD LEFT HAND  Final   Special Requests   Final    BOTTLES DRAWN AEROBIC ONLY Blood Culture adequate volume   Culture   Final    NO GROWTH 5 DAYS Performed at [facility], [place] [address], [place], [place] [address]    Report Status 01/01/2020 FINAL  Final  Culture, respiratory (non-expectorated)     Status: None   Collection Time: 12/25/19  4:20 PM   Specimen: Tracheal Aspirate; Respiratory  Result Value Ref Range Status   Specimen Description TRACHEAL ASPIRATE  Final   Special Requests NONE  Final   Gram Stain   Final    ABUNDANT WBC PRESENT, PREDOMINANTLY PMN NO ORGANISMS SEEN    Culture    Final    RARE Consistent with normal respiratory flora. Performed at [facility], [place] [address] [place], [place] [address]    Report Status 12/28/2019 FINAL  Final  Culture, respiratory (non-expectorated)     Status: None   Collection Time: 01/12/20  4:14 PM   Specimen: Tracheal Aspirate; Respiratory  Result Value Ref Range Status   Specimen Description TRACHEAL ASPIRATE  Final   Special Requests NONE  Final   Gram Stain   Final    ABUNDANT WBC PRESENT, PREDOMINANTLY PMN ABUNDANT GRAM NEGATIVE RODS RARE GRAM POSITIVE RODS Performed at [facility], [place] [address] [place], [place] [address]    Culture   Final    ABUNDANT PSEUDOMONAS AERUGINOSA FEW STENOTROPHOMONAS MALTOPHILIA    Report Status 01/16/2020 FINAL  Final   Organism ID, Bacteria PSEUDOMONAS AERUGINOSA  Final   Organism ID, Bacteria STENOTROPHOMONAS MALTOPHILIA  Final      Susceptibility   Pseudomonas aeruginosa - MIC*    CEFTAZIDIME 16 INTERMEDIATE Intermediate     CIPROFLOXACIN >=4 RESISTANT Resistant     GENTAMICIN 2 SENSITIVE Sensitive     IMIPENEM 2 SENSITIVE Sensitive     * ABUNDANT PSEUDOMONAS AERUGINOSA   Stenotrophomonas maltophilia - MIC*    LEVOFLOXACIN 0.5 SENSITIVE Sensitive     TRIMETH/SULFA <=20 SENSITIVE Sensitive     * FEW STENOTROPHOMONAS MALTOPHILIA  Culture, blood (Routine X 2) w Reflex to ID Panel     Status: None   Collection Time: 01/16/20  3:30 PM   Specimen: BLOOD LEFT HAND  Result Value Ref Range Status   Specimen Description BLOOD LEFT HAND  Final   Special Requests   Final    BOTTLES DRAWN AEROBIC AND ANAEROBIC Blood Culture adequate volume   Culture   Final    NO GROWTH 5 DAYS Performed at [facility], [address], [place], [place] [address]    Report Status 01/21/2020 FINAL  Final  Culture, blood (Routine X 2) w Reflex to ID Panel     Status: None   Collection Time: 01/16/20  3:32 PM   Specimen: BLOOD LEFT ARM  Result Value Ref Range Status   Specimen  Description BLOOD LEFT ARM  Final   Special Requests   Final    BOTTLES DRAWN AEROBIC AND ANAEROBIC Blood Culture adequate volume   Culture   Final    NO GROWTH 5  DAYS Performed at [facility], [place] [address], [place], [place] [address]    Report Status 01/21/2020 FINAL  Final    Coagulation Studies: No results for input(s): LABPROT, INR in the last 72 hours.  Urinalysis: No results for input(s): COLORURINE, LABSPEC, PHURINE, GLUCOSEU, HGBUR, BILIRUBINUR, KETONESUR, PROTEINUR, UROBILINOGEN, NITRITE, LEUKOCYTESUR in the last 72 hours.  Invalid input(s): APPERANCEUR    Imaging: No results found.   Medications:     heparin sodium (porcine), lidocaine, lidocaine  Assessment/ Plan:  74 y.o. male with a PMHx of acute respiratory failure status post COVID-19 infection status post tracheostomy placement, atrial fibrillation, hypertension, CVA, gout, hypertrophic cardiomyopathy, who was admitted to [facility] on 11/21/2019 for ongoing treatment of acute respiratory failure.  1.  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  Patient due for hemodialysis treatment today.  We will continue ultrafiltration target of 2.5 to 3 kg as this appears to have helped the patient a bit.  2.  Acute respiratory failure secondary to COVID-19 infection and its sequela.  -Patient currently transitioned off the ventilator.  Appears to be breathing comfortably but is still tachypneic.  3.  Hyperkalemia.  Resolved, most recent serum potassium was 4.0.  4.  Hyponatremia.  This also appears to have stabilized with ongoing dialysis treatments.  Serum sodium 139.  5.  Anemia of chronic kidney disease.  Hemoglobin has drifted up a bit and currently 7.6.  Continue to monitor.       LOS: 0 Shane Banks 4/28/202110:32 AM

## 2020-01-24 NOTE — Progress Notes (Signed)
Pulmonary Critical Care Medicine [place]   PULMONARY CRITICAL CARE SERVICE  PROGRESS NOTE  Date of Service: 01/24/2020  Shane Banks  WGY:[id number]  DOB: 11/20/1945   DOA: 11/21/2019  Referring Physician: [name], MD  HPI: Shane Banks is a 74 y.o. male seen for follow up of Acute on Chronic Respiratory Failure.  Patient is on the NAG right now on 4 L of oxygen and doing fairly well  Medications: Reviewed on Rounds  Physical Exam:  Vitals: Temperature is 99.9 pulse 81 respiratory 44 blood pressure is 150/59 saturations 98%  Ventilator Settings off the ventilator on the NAG on 4 L  . General: Comfortable at this time . Eyes: Grossly normal lids, irises & conjunctiva . ENT: grossly tongue is normal . Neck: no obvious mass . Cardiovascular: S1 S2 normal no gallop . Respiratory: No rhonchi coarse breath sounds . Abdomen: soft . Skin: no rash seen on limited exam . Musculoskeletal: not rigid . Psychiatric:unable to assess . Neurologic: no seizure no involuntary movements         Lab Data:   Basic Metabolic Panel: Recent Labs  Lab 01/19/20 0822 01/22/20 0742 01/24/20 0641  NA 137 136 139  K 3.8 4.2 4.0  CL 98 98 99  CO2 29 28 28   GLUCOSE 138* 136* 165*  BUN 87* 115* 109*  CREATININE 1.91* 2.15* 2.13*  CALCIUM 9.6 9.9 10.2  MG 1.9 2.1 1.8  PHOS 2.8 2.7 2.6    ABG: No results for input(s): PHART, PCO2ART, PO2ART, HCO3, O2SAT in the last 168 hours.  Liver Function Tests: Recent Labs  Lab 01/19/20 0822 01/22/20 0742 01/24/20 0641  ALBUMIN 2.4* 2.5* 2.6*   No results for input(s): LIPASE, AMYLASE in the last 168 hours. No results for input(s): AMMONIA in the last 168 hours.  CBC: Recent Labs  Lab 01/19/20 0822 01/20/20 1107 01/22/20 0742 01/24/20 0641  WBC 21.8* 22.3* 24.1* 23.2*  HGB 7.1* 7.5* 7.4* 7.6*  HCT 23.4* 24.8* 24.3* 24.8*  MCV 87.3 86.7 86.8 86.4  PLT 230 227 244 242    Cardiac Enzymes: No results for  input(s): CKTOTAL, CKMB, CKMBINDEX, TROPONINI in the last 168 hours.  BNP (last 3 results) No results for input(s): BNP in the last 8760 hours.  ProBNP (last 3 results) No results for input(s): PROBNP in the last 8760 hours.  Radiological Exams: No results found.  Assessment/Plan Active Problems:   Acute on chronic respiratory failure with hypoxia (HCC)   COVID-19 virus infection   Atrial fibrillation with RVR (HCC)   Severe sepsis (HCC)   Pneumonia due to COVID-19 virus   1. Acute on chronic respiratory failure hypoxia we will continue with supportive care on the NAG and using 4 L of oxygen 2. COVID-19 virus infection treated we will continue to follow 3. Atrial fibrillation with RVR rate controlled 4. Severe sepsis resolved 5. Pneumonia due to COVID-19 treated we will continue to follow along   I have personally seen and evaluated the patient, evaluated laboratory and imaging results, formulated the assessment and plan and placed orders. The Patient requires high complexity decision making with multiple systems involvement.  Rounds were done with the Respiratory Therapy Director and Staff therapists and discussed with nursing staff also.  Allyne Gee, MD [facility] Pulmonary Critical Care Medicine Sleep Medicine

## 2020-01-25 DIAGNOSIS — N179 Acute kidney failure, unspecified: Secondary | ICD-10-CM | POA: Diagnosis not present

## 2020-01-25 DIAGNOSIS — J9621 Acute and chronic respiratory failure with hypoxia: Secondary | ICD-10-CM | POA: Diagnosis not present

## 2020-01-25 DIAGNOSIS — U071 COVID-19: Secondary | ICD-10-CM | POA: Diagnosis not present

## 2020-01-25 DIAGNOSIS — I4891 Unspecified atrial fibrillation: Secondary | ICD-10-CM | POA: Diagnosis not present

## 2020-01-25 NOTE — Progress Notes (Signed)
Pulmonary Critical Care Medicine [place]   PULMONARY CRITICAL CARE SERVICE  PROGRESS NOTE  Date of Service: 01/25/2020  Amor Packard  ZOX:[id number]  DOB: 04-Nov-1945   DOA: 11/21/2019  Referring Physician: [name], MD  HPI: Nimrod Wendt is a 74 y.o. male seen for follow up of Acute on Chronic Respiratory Failure.  Currently off ventilator on the NAG on 3 L of oxygen  Medications: Reviewed on Rounds  Physical Exam:  Vitals: Temperature is 97.6 pulse 60 respiratory rate 32 blood pressure is 108/56 saturations 100%  Ventilator Settings on the NAG on 3 L  . General: Comfortable at this time . Eyes: Grossly normal lids, irises & conjunctiva . ENT: grossly tongue is normal . Neck: no obvious mass . Cardiovascular: S1 S2 normal no gallop . Respiratory: No rhonchi no rales are noted at this time . Abdomen: soft . Skin: no rash seen on limited exam . Musculoskeletal: not rigid . Psychiatric:unable to assess . Neurologic: no seizure no involuntary movements         Lab Data:   Basic Metabolic Panel: Recent Labs  Lab 01/19/20 0822 01/22/20 0742 01/24/20 0641  NA 137 136 139  K 3.8 4.2 4.0  CL 98 98 99  CO2 29 28 28   GLUCOSE 138* 136* 165*  BUN 87* 115* 109*  CREATININE 1.91* 2.15* 2.13*  CALCIUM 9.6 9.9 10.2  MG 1.9 2.1 1.8  PHOS 2.8 2.7 2.6    ABG: No results for input(s): PHART, PCO2ART, PO2ART, HCO3, O2SAT in the last 168 hours.  Liver Function Tests: Recent Labs  Lab 01/19/20 0822 01/22/20 0742 01/24/20 0641  ALBUMIN 2.4* 2.5* 2.6*   No results for input(s): LIPASE, AMYLASE in the last 168 hours. No results for input(s): AMMONIA in the last 168 hours.  CBC: Recent Labs  Lab 01/19/20 0822 01/20/20 1107 01/22/20 0742 01/24/20 0641  WBC 21.8* 22.3* 24.1* 23.2*  HGB 7.1* 7.5* 7.4* 7.6*  HCT 23.4* 24.8* 24.3* 24.8*  MCV 87.3 86.7 86.8 86.4  PLT 230 227 244 242    Cardiac Enzymes: No results for input(s): CKTOTAL, CKMB,  CKMBINDEX, TROPONINI in the last 168 hours.  BNP (last 3 results) No results for input(s): BNP in the last 8760 hours.  ProBNP (last 3 results) No results for input(s): PROBNP in the last 8760 hours.  Radiological Exams: No results found.  Assessment/Plan Active Problems:   Acute on chronic respiratory failure with hypoxia (HCC)   COVID-19 virus infection   Atrial fibrillation with RVR (HCC)   Severe sepsis (HCC)   Pneumonia due to COVID-19 virus   1. Acute on chronic respiratory failure hypoxia we will continue with NAG device titrate oxygen continue pulmonary toilet.  Right now is requiring 3 L of oxygen 2. COVID-19 virus infection resolved 3. Atrial fibrillation rate controlled 4. Severe sepsis hemodynamically stable 5. Pneumonia due to COVID-19 treated clinically improved    I have personally seen and evaluated the patient, evaluated laboratory and imaging results, formulated the assessment and plan and placed orders. The Patient requires high complexity decision making with multiple systems involvement.  Rounds were done with the Respiratory Therapy Director and Staff therapists and discussed with nursing staff also.  Allyne Gee, MD [facility] Pulmonary Critical Care Medicine Sleep Medicine

## 2020-01-25 NOTE — Progress Notes (Signed)
PROGRESS NOTE    Shane Banks  ZYS:[id number] DOB: [birth year] DOA: 11/21/2019   Brief Narrative:  Shane Banks an 74 y.o.malewho was admitted to [facility] on 11/21/2019. He has a past medical history of peripheral vascular disease, hypertension, cardiomyopathy, gout. He apparently was admitted to the acute facility for worsening shortness of breath on 10/06/2019 through 11/21/2019. He was also previously admitted on 10/02/2019 for COVID-19 infection. After he was discharged, he apparently had worsening shortness of breath, hypoxemia. He was also noted to have fever of 101.5. Chest x-ray at that time was consistent with pneumonia. He also had elevated troponin which was thought to be secondary to demand ischemia. He initially was not given remdesivir or plasma but when he presented back the second time he was more hypoxemic and was given steroids. Patient hospital course complicated by worsening shortness of breath leading to respiratory failure which led to intubation. He was on the ventilator. He had a difficult time weaning therefore underwent tracheostomy. Per records from the outside facility on 10/22/2019 patient apparently had purple blistering on the sacral region for which the wound care team was consulted. The woundsworsenedand on 11/13/2019 he underwent debridement of the sacrum with debridement of skin, muscle, necrotic fat. Infectious disease was also consulted. Since admission he was on multiple antibiotics initially with ceftriaxone/azithromycin/Fortaz and then with meropenem/vancomycin. Patient continued to have progressive increase in WBC count and new onset fevers despite being on antibiotics. Per ID consult he was started on tigecycline for intra-abdominal coverage and empiric p.o. vancomycin. P.o. vancomycin was later changed to Dificid on 10/24/2019 for diarrhea. Cultures however were negative. His hep C was also positive. He was treated with remdesivir  and convalescent plasma. Hospital course complicated by thrombocytopenia, acute renal failure. Due to his worsening pressure ulcer he underwent laparoscopic diverting end colostomy on 11/16/2019. He wastreated with multiple antibiotics includingIV vancomycin, azithromycin, ceftriaxone, tigecycline, Fortaz,meropenem, Flagyl and Tobra nebulizers?His respiratory cultures from here showed Pseudomonas aeruginosa, E. coli. He was on treatment with ceftazidime and ciprofloxacin. However, patient did not improve. Repeat respiratory cultures from 12/11/2019 again showing few Pseudomonas aeruginosa, few Stenotrophomonas maltophilia.  His respiratory status later worsened and he was on 100% FiO2. Therefore he was given gentamicin. After that BUN/creatinine worsened.  He previously had nausea and vomiting and likely had aspiration pneumonia as well. Currently off ventilator on oxygen.  However, he continues to have coarse breath sounds and unresponsive.  WBC count continues to be elevated at 23.2.  Respiratory cultures from 01/12/2020 showed Pseudomonas, stenotrophomonas.  Patient now on Levaquin for stenotrophomonas and meropenem for the Pseudomonas.   Assessment & Plan:   Active Problems:   Acute on chronic respiratory failure with hypoxia (HCC)   COVID-19 virus infection   Atrial fibrillation with RVR (HCC)   Severe sepsis (HCC)   Pneumonia due to COVID-19 virus Pneumonia due to Pseudomonas,stenotrophomonas Sacral pressure ulcer unstageable Leukocytosis Acute renal failure Anasarca Gram-positive bacteremia UTI Atrial fibrillation Diabetes mellitus type 2 Dysphagia Protein calorie malnutrition Hepatitis C  Acute on chronic hypoxemic respiratory failure: Multifactorial. Initially started with COVID-19 infection. However, patient subsequently had bacterial pneumonia with Pseudomonas. He has received several antibiotics at the outside facility including Fortaz, meropenem, cefepime,  tobramycin nebulizers,vancomycin, azithromycin, ceftriaxone, tigecycline.Previously he was ontreatment with vancomycin, meropenem, Flagyl. Respiratory cultures from 01/12/2020 again showing Pseudomonas and also stenotrophomonas.  He was given gentamicin due to worsening respiratory failure.  However, had worsening BUN/creatinine from diet and started on dialysis.  After that he received treatment  with Avycaz, Levaquin and also with Zyvox.Reluctant to use tobramycin nebulizers in the setting of acute renal failure.    Previous CT chest without contrast showing large bilateral pleural effusions, severe bilateral airspace disease with near complete atelectasis or consolidation throughout the left lung.  Currently on treatment with stenotrophomonas, meropenem.  Plan to treat for a total duration of 7-10 days pending improvement.  Continue aggressive pulmonary toileting.  Pulmonary following.  Recent COVID-19 infection: Patient was already treated with remdesivir and convalescent plasma at the outside facility. He was on steroids. Continue supportive management per the primary team.  Pneumonia: His respiratory cultures at the outside facility reportedly showed Pseudomonas.He has been treated with multiple antibiotics as mentioned above. More recently received treatment with ceftazidime and ciprofloxacin for Pseudomonas and E. coli noted on the respiratory cultures.  Due to worsening WBC count and worsening respiratory failure ceftazidime and ciprofloxacin was discontinued and he received treatment with Avycaz and Levaquin.  He also received treatment with Zyvox.  We also added tobramycin nebulizers. However, recent respiratory cultures again showing the Pseudomonas and stenotrophomonas. WBC count continues to be high.  On treatment with meropenem, Levaquin.  We will plan reevaluation 7-10 days pending improvement.  However, he continues to have coarse breath sounds and also more than likely aspirating  which place him at a high risk for recurrent and worsening pneumonia despite being on antibiotics.  BPZ:WCHEN cultures from 12/04/2019 showed Pseudomonas aeruginosa. Repeat urine cultures from 12/11/2019 showing less than 10,000 colonies per mL, insignificant growth. Antibiotics as mentioned above.  Sacral pressure ulcer unstageable: Patient has a sacral pressure ulcer that was unstageable and had surgical debridement at the outside facility.He had debridement done on 12/01/2019.Continue local wound care.  Due to his debility, immobility he is very high risk for worsening of the pressure ulcer.  Severe sepsis: Patient was on pressors at the outside facility. He has received several rounds of antibiotics as mentioned above.He has received several rounds of antibiotics.  He is very high risk for recurrent sepsis. Continue to monitor.  If he starts having fevers greater than 101 would recommend to send for cultures again.  Gram-positive bacteremia: His blood cultures previously showed coagulase-negative staph in 1 bottle. Repeat blood cultures did not show show any growth. He was treated with Zyvox.  Since repeat blood cultures were negative therefore discontinued the Zyvox.  Acute renal failure: Patient had elevated BUN/creatinine. Patient started on dialysis.  Nephrology following. Antibiotics renally dosed.  Atrial fibrillation: Continue medications and management per primary team.  Leukocytosis:  WBC count continues to be elevated. Antibiotics as mentioned above. Continue to monitor counts.  Diabetes mellitus type 2: Continue to monitor Accu-Cheks, medications for diabetes per the primary team.He will need proper glycemic control in order to enable wound healing.  Dysphagia: Unfortunately due to his dysphagia he is very high risk for aspiration and worsening respiratory failure secondary to aspiration pneumonia.  Protein caloriemalnutrition: Management per the primary  team.  Hepatitis C: Patient will need to follow-up in the outpatient clinic once his acute issues resolve.  Unfortunately due to his complex medical problems he is very high risk for worsening and decompensation. Plan of care discussed with the primary team and pharmacy.    Subjective: He continues to be unresponsive, not following commands.  Objective: Vitals: Temperature 97.6, pulse 60, respiratory rate 32, blood pressure 108/56, oxygen saturation 100%  Examination: Constitutional:Ill-appearing male,unresponsive, not following any commands at this time. Head: Atraumatic, normocephalic Eyes: PERLA  ENMT: external ears  and nose appear normal, Lips appears normal,unable to evaluate the oropharynx Neck:Has trach in place CVS: S1-S2, no murmur Respiratory:Coarse breath sounds, rhonchi, no wheezing Abdomen:ostomy, positive bowel sounds, PEG tube Musculoskeletal:Upper and lower extremity edema Neuro:He is lethargic, notresponding, unable to do a neurologic exam at this time Psych:Unable to assess at this time Skin:Sacral pressure ulcer unstageable    Data Reviewed: I have personally reviewed following labs and imaging studies  CBC: Recent Labs  Lab 01/19/20 0822 01/20/20 1107 01/22/20 0742 01/24/20 0641  WBC 21.8* 22.3* 24.1* 23.2*  HGB 7.1* 7.5* 7.4* 7.6*  HCT 23.4* 24.8* 24.3* 24.8*  MCV 87.3 86.7 86.8 86.4  PLT 230 227 244 735    Basic Metabolic Panel: Recent Labs  Lab 01/19/20 0822 01/22/20 0742 01/24/20 0641  NA 137 136 139  K 3.8 4.2 4.0  CL 98 98 99  CO2 29 28 28   GLUCOSE 138* 136* 165*  BUN 87* 115* 109*  CREATININE 1.91* 2.15* 2.13*  CALCIUM 9.6 9.9 10.2  MG 1.9 2.1 1.8  PHOS 2.8 2.7 2.6    GFR: CrCl cannot be calculated (Unknown ideal weight.).  Liver Function Tests: Recent Labs  Lab 01/19/20 0822 01/22/20 0742 01/24/20 0641  ALBUMIN 2.4* 2.5* 2.6*    CBG: No results for input(s): GLUCAP in the last 168 hours.   Recent  Results (from the past 240 hour(s))  Culture, blood (Routine X 2) w Reflex to ID Panel     Status: None   Collection Time: 01/16/20  3:30 PM   Specimen: BLOOD LEFT HAND  Result Value Ref Range Status   Specimen Description BLOOD LEFT HAND  Final   Special Requests   Final    BOTTLES DRAWN AEROBIC AND ANAEROBIC Blood Culture adequate volume   Culture   Final    NO GROWTH 5 DAYS Performed at [facility], [address], [place], [place] [address]    Report Status 01/21/2020 FINAL  Final  Culture, blood (Routine X 2) w Reflex to ID Panel     Status: None   Collection Time: 01/16/20  3:32 PM   Specimen: BLOOD LEFT ARM  Result Value Ref Range Status   Specimen Description BLOOD LEFT ARM  Final   Special Requests   Final    BOTTLES DRAWN AEROBIC AND ANAEROBIC Blood Culture adequate volume   Culture   Final    NO GROWTH 5 DAYS Performed at [facility], [place] [address] [place], [place] [address]    Report Status 01/21/2020 FINAL  Final         Radiology Studies: No results found.      Scheduled Meds: Please see MAR   Yaakov Guthrie, MD  01/25/2020, 5:09 PM

## 2020-01-26 DIAGNOSIS — J9621 Acute and chronic respiratory failure with hypoxia: Secondary | ICD-10-CM | POA: Diagnosis not present

## 2020-01-26 DIAGNOSIS — U071 COVID-19: Secondary | ICD-10-CM | POA: Diagnosis not present

## 2020-01-26 DIAGNOSIS — I4891 Unspecified atrial fibrillation: Secondary | ICD-10-CM | POA: Diagnosis not present

## 2020-01-26 DIAGNOSIS — N179 Acute kidney failure, unspecified: Secondary | ICD-10-CM | POA: Diagnosis not present

## 2020-01-26 LAB — CBC
HCT: 24.4 % — ABNORMAL LOW (ref 39.0–52.0)
Hemoglobin: 7.4 g/dL — ABNORMAL LOW (ref 13.0–17.0)
MCH: 26.3 pg (ref 26.0–34.0)
MCHC: 30.3 g/dL (ref 30.0–36.0)
MCV: 86.8 fL (ref 80.0–100.0)
Platelets: 223 10*3/uL (ref 150–400)
RBC: 2.81 MIL/uL — ABNORMAL LOW (ref 4.22–5.81)
RDW: 17.4 % — ABNORMAL HIGH (ref 11.5–15.5)
WBC: 19.1 10*3/uL — ABNORMAL HIGH (ref 4.0–10.5)
nRBC: 0 % (ref 0.0–0.2)

## 2020-01-26 LAB — RENAL FUNCTION PANEL
Albumin: 2.7 g/dL — ABNORMAL LOW (ref 3.5–5.0)
Anion gap: 6 (ref 5–15)
BUN: 100 mg/dL — ABNORMAL HIGH (ref 8–23)
CO2: 30 mmol/L (ref 22–32)
Calcium: 10 mg/dL (ref 8.9–10.3)
Chloride: 97 mmol/L — ABNORMAL LOW (ref 98–111)
Creatinine, Ser: 1.7 mg/dL — ABNORMAL HIGH (ref 0.61–1.24)
GFR calc Af Amer: 46 mL/min — ABNORMAL LOW (ref >60)
GFR calc non Af Amer: 39 mL/min — ABNORMAL LOW (ref >60)
Glucose, Bld: 163 mg/dL — ABNORMAL HIGH (ref 70–99)
Phosphorus: 2.9 mg/dL (ref 2.5–4.6)
Potassium: 3.7 mmol/L (ref 3.5–5.1)
Sodium: 133 mmol/L — ABNORMAL LOW (ref 135–145)

## 2020-01-26 LAB — MAGNESIUM: Magnesium: 1.8 mg/dL (ref 1.7–2.4)

## 2020-01-26 NOTE — Progress Notes (Signed)
Let me know [facility]  ROUNDING NOTE   Subjective:  Patient seen and evaluated during hemodialysis. Ultrafiltration target 2 kg. Receiving albumin during dialysis treatments.   Objective:  Vital signs in last 24 hours:  Temperature 97.9 pulse 59 respiration 33 blood pressure 128/61  Physical Exam: General: Critically ill-appearing  Head: Normocephalic, atraumatic. Moist oral mucosal membranes  Eyes: Anicteric  Neck: Tracheostomy in place  Lungs:  Scattered rhonchi, back on ventilator  Heart: S1S2 no rubs  Abdomen:  Soft, nontender, bowel sounds present, PEG tube in place  Extremities: 2+ peripheral edema  Neurologic: Awake, alert, will follow simple commands  Skin: No rash  Access: Right IJ temporary dialysis catheter    Basic Metabolic Panel: Recent Labs  Lab 01/19/20 0822 01/19/20 0822 01/22/20 0742 01/24/20 0641 01/26/20 0505  NA 137  --  136 139 133*  K 3.8  --  4.2 4.0 3.7  CL 98  --  98 99 97*  CO2 29  --  28 28 30   GLUCOSE 138*  --  136* 165* 163*  BUN 87*  --  115* 109* 100*  CREATININE 1.91*  --  2.15* 2.13* 1.70*  CALCIUM 9.6   < > 9.9 10.2 10.0  MG 1.9  --  2.1 1.8 1.8  PHOS 2.8  --  2.7 2.6 2.9   < > = values in this interval not displayed.    Liver Function Tests: Recent Labs  Lab 01/19/20 0822 01/22/20 0742 01/24/20 0641 01/26/20 0505  ALBUMIN 2.4* 2.5* 2.6* 2.7*   No results for input(s): LIPASE, AMYLASE in the last 168 hours. No results for input(s): AMMONIA in the last 168 hours.  CBC: Recent Labs  Lab 01/19/20 0822 01/20/20 1107 01/22/20 0742 01/24/20 0641 01/26/20 0505  WBC 21.8* 22.3* 24.1* 23.2* 19.1*  HGB 7.1* 7.5* 7.4* 7.6* 7.4*  HCT 23.4* 24.8* 24.3* 24.8* 24.4*  MCV 87.3 86.7 86.8 86.4 86.8  PLT 230 227 244 242 223    Cardiac Enzymes: No results for input(s): CKTOTAL, CKMB, CKMBINDEX, TROPONINI in the last 168 hours.  BNP: Invalid input(s): POCBNP  CBG: No results for input(s): GLUCAP in the last  168 hours.  Microbiology: Results for orders placed or performed during the hospital encounter of 11/21/19  Culture, Urine     Status: Abnormal   Collection Time: 11/22/19  1:25 PM   Specimen: Urine, Random  Result Value Ref Range Status   Specimen Description URINE, RANDOM  Final   Special Requests   Final    NONE Performed at [facility], [address] [place], [place] [address]    Culture 50,000 COLONIES/mL YEAST (A)  Final   Report Status 11/23/2019 FINAL  Final  Culture, blood (routine x 2)     Status: None   Collection Time: 11/22/19  1:40 PM   Specimen: BLOOD LEFT HAND  Result Value Ref Range Status   Specimen Description BLOOD LEFT HAND  Final   Special Requests   Final    BOTTLES DRAWN AEROBIC ONLY Blood Culture results may not be optimal due to an inadequate volume of blood received in culture bottles   Culture   Final    NO GROWTH 5 DAYS Performed at [facility], [place] [address] [place], [place] [address]    Report Status 11/27/2019 FINAL  Final  Culture, blood (routine x 2)     Status: None   Collection Time: 11/22/19  1:44 PM   Specimen: BLOOD LEFT  HAND  Result Value Ref Range Status   Specimen Description BLOOD LEFT HAND  Final   Special Requests   Final    BOTTLES DRAWN AEROBIC ONLY Blood Culture results may not be optimal due to an inadequate volume of blood received in culture bottles   Culture   Final    NO GROWTH 5 DAYS Performed at [facility] [address] [place], [place] [address]    Report Status 11/27/2019 FINAL  Final  Culture, respiratory (non-expectorated)     Status: None   Collection Time: 11/22/19  4:59 PM   Specimen: Tracheal Aspirate; Respiratory  Result Value Ref Range Status   Specimen Description TRACHEAL ASPIRATE  Final   Special Requests NONE  Final   Gram Stain   Final    ABUNDANT WBC PRESENT, PREDOMINANTLY PMN NO ORGANISMS SEEN    Culture   Final    NO GROWTH 2 DAYS Performed at [facility], [address], [place], [place] [address]    Report Status 11/24/2019 FINAL  Final  Culture, respiratory (non-expectorated)     Status: None   Collection Time: 12/04/19  2:22 AM   Specimen: Tracheal Aspirate; Respiratory  Result Value Ref Range Status   Specimen Description TRACHEAL ASPIRATE  Final   Special Requests NONE  Final   Gram Stain   Final    ABUNDANT WBC PRESENT, PREDOMINANTLY PMN ABUNDANT GRAM NEGATIVE RODS Performed at [facility], [address] [place], [place] [address]    Culture   Final    MODERATE PSEUDOMONAS AERUGINOSA FEW ESCHERICHIA COLI Confirmed Extended Spectrum Beta-Lactamase Producer (ESBL).  In bloodstream infections from ESBL organisms, carbapenems are preferred over piperacillin/tazobactam. They are shown to have a lower risk of mortality.    Report Status 12/09/2019 FINAL  Final   Organism ID, Bacteria PSEUDOMONAS AERUGINOSA  Final   Organism ID, Bacteria ESCHERICHIA COLI  Final      Susceptibility   Escherichia coli - MIC*    AMPICILLIN >=32 RESISTANT Resistant     CEFAZOLIN >=64 RESISTANT Resistant     CEFEPIME 2 SENSITIVE Sensitive     CEFTAZIDIME RESISTANT Resistant     CEFTRIAXONE >=64 RESISTANT Resistant     CIPROFLOXACIN <=0.25 SENSITIVE Sensitive     GENTAMICIN >=16 RESISTANT Resistant     IMIPENEM <=0.25 SENSITIVE Sensitive     TRIMETH/SULFA >=320 RESISTANT Resistant     AMPICILLIN/SULBACTAM 16 INTERMEDIATE Intermediate     PIP/TAZO <=4 SENSITIVE Sensitive     * FEW ESCHERICHIA COLI   Pseudomonas aeruginosa - MIC*    CEFTAZIDIME 16 INTERMEDIATE Intermediate     CIPROFLOXACIN >=4 RESISTANT Resistant     GENTAMICIN 2 SENSITIVE Sensitive     IMIPENEM >=16 RESISTANT Resistant     * MODERATE PSEUDOMONAS AERUGINOSA  Culture, Urine     Status: Abnormal   Collection Time: 12/04/19  6:33 PM   Specimen: Urine, Random  Result Value Ref Range Status   Specimen Description URINE, RANDOM  Final   Special Requests   Final     NONE Performed at [facility], [address] [place],  [address]    Culture >=100,000 COLONIES/mL PSEUDOMONAS AERUGINOSA (A)  Final   Report Status 12/06/2019 FINAL  Final   Organism ID, Bacteria PSEUDOMONAS AERUGINOSA (A)  Final      Susceptibility   Pseudomonas aeruginosa - MIC*    CEFTAZIDIME 4 SENSITIVE Sensitive     CIPROFLOXACIN >=4 RESISTANT Resistant  GENTAMICIN 4 SENSITIVE Sensitive     IMIPENEM 2 SENSITIVE Sensitive     PIP/TAZO 16 SENSITIVE Sensitive     * >=100,000 COLONIES/mL PSEUDOMONAS AERUGINOSA  Culture, blood (routine x 2)     Status: Abnormal   Collection Time: 12/11/19  9:39 AM   Specimen: BLOOD LEFT HAND  Result Value Ref Range Status   Specimen Description BLOOD LEFT HAND  Final   Special Requests   Final    BOTTLES DRAWN AEROBIC AND ANAEROBIC Blood Culture adequate volume   Culture  Setup Time   Final    GRAM POSITIVE COCCI IN CLUSTERS ANAEROBIC BOTTLE ONLY CRITICAL RESULT CALLED TO, READ BACK BY AND VERIFIED WITH: RN R [place] [id number] AT 69 BY CM    Culture (A)  Final    STAPHYLOCOCCUS SPECIES (COAGULASE NEGATIVE) THE SIGNIFICANCE OF ISOLATING THIS ORGANISM FROM A SINGLE SET OF BLOOD CULTURES WHEN MULTIPLE SETS ARE DRAWN IS UNCERTAIN. PLEASE NOTIFY THE MICROBIOLOGY DEPARTMENT WITHIN ONE WEEK IF SPECIATION AND SENSITIVITIES ARE REQUIRED. Performed at [facility], [place] [address], [place], [place] [address]    Report Status 12/14/2019 FINAL  Final  Culture, blood (routine x 2)     Status: None   Collection Time: 12/11/19  9:47 AM   Specimen: BLOOD  Result Value Ref Range Status   Specimen Description BLOOD RIGHT ANTECUBITAL  Final   Special Requests   Final    BOTTLES DRAWN AEROBIC AND ANAEROBIC Blood Culture adequate volume   Culture  Setup Time   Final    CORRECTED RESULTS NO ORGANISMS SEEN PREVIOUSLY REPORTED AS: GRAM POSITIVE COCCI IN CLUSTERS CORRECTED RESULTS CALLED TO: RN R CUMMINGS [id number] AT 920 AM BY CM    Culture    Final    NO GROWTH 5 DAYS Performed at [facility], [place] [address], [place], [place] [address]    Report Status 12/16/2019 FINAL  Final  Culture, respiratory (non-expectorated)     Status: None   Collection Time: 12/11/19 10:27 AM   Specimen: Tracheal Aspirate; Respiratory  Result Value Ref Range Status   Specimen Description TRACHEAL ASPIRATE  Final   Special Requests NONE  Final   Gram Stain NO WBC SEEN NO ORGANISMS SEEN   Final   Culture   Final    FEW PSEUDOMONAS AERUGINOSA FEW STENOTROPHOMONAS MALTOPHILIA SEE SEPARATE REPORT IN [facility] FOR DOS 12/11/19. Performed at [facility], [place] [address], [place], [place] [address]    Report Status 12/26/2019 FINAL  Final   Organism ID, Bacteria PSEUDOMONAS AERUGINOSA  Final   Organism ID, Bacteria STENOTROPHOMONAS MALTOPHILIA  Final      Susceptibility   Pseudomonas aeruginosa - MIC*    CEFTAZIDIME 16 INTERMEDIATE Intermediate     CIPROFLOXACIN >=4 RESISTANT Resistant     GENTAMICIN 2 SENSITIVE Sensitive     IMIPENEM >=16 RESISTANT Resistant     * FEW PSEUDOMONAS AERUGINOSA   Stenotrophomonas maltophilia - MIC*    LEVOFLOXACIN 0.5 SENSITIVE Sensitive     TRIMETH/SULFA <=20 SENSITIVE Sensitive     * FEW STENOTROPHOMONAS MALTOPHILIA  Culture, Urine     Status: Abnormal   Collection Time: 12/11/19 11:30 AM   Specimen: Urine, Random  Result Value Ref Range Status   Specimen Description URINE, RANDOM  Final   Special Requests NONE  Final   Culture (A)  Final    <10,000 COLONIES/mL INSIGNIFICANT GROWTH Performed at [facility], [place] [address] [place], [place] [address]  Report Status 12/12/2019 FINAL  Final  Culture, blood (routine x 2)     Status: None   Collection Time: 12/15/19 12:37 PM   Specimen: BLOOD LEFT HAND  Result Value Ref Range Status   Specimen Description BLOOD LEFT HAND  Final   Special Requests   Final    BOTTLES DRAWN AEROBIC AND ANAEROBIC Blood Culture adequate volume   Culture   Final     NO GROWTH 5 DAYS Performed at [facility], [place] [address],  [place], [place] [address]    Report Status 12/20/2019 FINAL  Final  Culture, blood (routine x 2)     Status: None   Collection Time: 12/15/19 12:42 PM   Specimen: BLOOD LEFT HAND  Result Value Ref Range Status   Specimen Description BLOOD LEFT HAND  Final   Special Requests   Final    BOTTLES DRAWN AEROBIC AND ANAEROBIC Blood Culture adequate volume   Culture   Final    NO GROWTH 5 DAYS Performed at [facility], [place] [address] [place], [place] [address]    Report Status 12/20/2019 FINAL  Final  C Difficile Quick Screen w PCR reflex     Status: None   Collection Time: 12/17/19  7:00 PM  Result Value Ref Range Status   C Diff antigen NEGATIVE NEGATIVE Final   C Diff toxin NEGATIVE NEGATIVE Final   C Diff interpretation No C. difficile detected.  Final    Comment: Performed at [facility], [place] [address], [place], [place] [address]  SARS CORONAVIRUS 2 (TAT 6-24 HRS) Nasopharyngeal Nasopharyngeal Swab     Status: None   Collection Time: 12/20/19 11:45 AM   Specimen: Nasopharyngeal Swab  Result Value Ref Range Status   SARS Coronavirus 2 NEGATIVE NEGATIVE Final    Comment: (NOTE) SARS-CoV-2 target nucleic acids are NOT DETECTED. The SARS-CoV-2 RNA is generally detectable in upper and lower respiratory specimens during the acute phase of infection. Negative results do not preclude SARS-CoV-2 infection, do not rule out co-infections with other pathogens, and should not be used as the sole basis for treatment or other patient management decisions. Negative results must be combined with clinical observations, patient history, and epidemiological information. The expected result is Negative. Fact Sheet for Patients: SugarRoll.be Fact Sheet for Healthcare Providers: https://www.woods-mathews.com/ This test is not yet approved or cleared by the [place] FDA  and  has been authorized for detection and/or diagnosis of SARS-CoV-2 by FDA under an Emergency Use Authorization (EUA). This EUA will remain  in effect (meaning this test can be used) for the duration of the COVID-19 declaration under Section 56 4(b)(1) of the Act, 21 U.S.C. section 360bbb-3(b)(1), unless the authorization is terminated or revoked sooner. Performed at [facility], [place] [address], [place], [place] [address]   Body fluid culture     Status: None   Collection Time: 12/21/19 12:10 PM   Specimen: Lung, Left; Pleural Fluid  Result Value Ref Range Status   Specimen Description PLEURAL FLUID  Final   Special Requests LEFT LUNG THORA  Final   Gram Stain   Final    RARE WBC PRESENT, PREDOMINANTLY MONONUCLEAR NO ORGANISMS SEEN    Culture   Final    NO GROWTH 3 DAYS Performed at [facility], [place] [address] [place], [place] [address]    Report Status 12/24/2019 FINAL  Final  Culture, blood (routine x 2)     Status: None  Collection Time: 12/25/19  2:16 PM   Specimen: BLOOD RIGHT HAND  Result Value Ref Range Status   Specimen Description BLOOD RIGHT HAND  Final   Special Requests   Final    BOTTLES DRAWN AEROBIC ONLY Blood Culture adequate volume   Culture   Final    NO GROWTH 5 DAYS Performed at [facility], [address] [place], [place] [address]    Report Status 01/01/2020 FINAL  Final  Culture, blood (routine x 2)     Status: None   Collection Time: 12/25/19  2:18 PM   Specimen: BLOOD LEFT HAND  Result Value Ref Range Status   Specimen Description BLOOD LEFT HAND  Final   Special Requests   Final    BOTTLES DRAWN AEROBIC ONLY Blood Culture adequate volume   Culture   Final    NO GROWTH 5 DAYS Performed at [facility], [place] [address] [place], [place] [address]    Report Status 01/01/2020 FINAL  Final  Culture, respiratory (non-expectorated)     Status: None   Collection Time: 12/25/19  4:20 PM   Specimen: Tracheal Aspirate;  Respiratory  Result Value Ref Range Status   Specimen Description TRACHEAL ASPIRATE  Final   Special Requests NONE  Final   Gram Stain   Final    ABUNDANT WBC PRESENT, PREDOMINANTLY PMN NO ORGANISMS SEEN    Culture   Final    RARE Consistent with normal respiratory flora. Performed at [facility], [place] [address], [place], [place] [address]    Report Status 12/28/2019 FINAL  Final  Culture, respiratory (non-expectorated)     Status: None   Collection Time: 01/12/20  4:14 PM   Specimen: Tracheal Aspirate; Respiratory  Result Value Ref Range Status   Specimen Description TRACHEAL ASPIRATE  Final   Special Requests NONE  Final   Gram Stain   Final    ABUNDANT WBC PRESENT, PREDOMINANTLY PMN ABUNDANT GRAM NEGATIVE RODS RARE GRAM POSITIVE RODS Performed at [facility], [place] [address] [place], [place] [address]    Culture   Final    ABUNDANT PSEUDOMONAS AERUGINOSA FEW STENOTROPHOMONAS MALTOPHILIA    Report Status 01/16/2020 FINAL  Final   Organism ID, Bacteria PSEUDOMONAS AERUGINOSA  Final   Organism ID, Bacteria STENOTROPHOMONAS MALTOPHILIA  Final      Susceptibility   Pseudomonas aeruginosa - MIC*    CEFTAZIDIME 16 INTERMEDIATE Intermediate     CIPROFLOXACIN >=4 RESISTANT Resistant     GENTAMICIN 2 SENSITIVE Sensitive     IMIPENEM 2 SENSITIVE Sensitive     * ABUNDANT PSEUDOMONAS AERUGINOSA   Stenotrophomonas maltophilia - MIC*    LEVOFLOXACIN 0.5 SENSITIVE Sensitive     TRIMETH/SULFA <=20 SENSITIVE Sensitive     * FEW STENOTROPHOMONAS MALTOPHILIA  Culture, blood (Routine X 2) w Reflex to ID Panel     Status: None   Collection Time: 01/16/20  3:30 PM   Specimen: BLOOD LEFT HAND  Result Value Ref Range Status   Specimen Description BLOOD LEFT HAND  Final   Special Requests   Final    BOTTLES DRAWN AEROBIC AND ANAEROBIC Blood Culture adequate volume   Culture   Final    NO GROWTH 5 DAYS Performed at [facility], [address], [place], [place]  [address]    Report Status 01/21/2020 FINAL  Final  Culture, blood (Routine X 2) w Reflex to ID Panel     Status: None  Collection Time: 01/16/20  3:32 PM   Specimen: BLOOD LEFT ARM  Result Value Ref Range Status   Specimen Description BLOOD LEFT ARM  Final   Special Requests   Final    BOTTLES DRAWN AEROBIC AND ANAEROBIC Blood Culture adequate volume   Culture   Final    NO GROWTH 5 DAYS Performed at [facility], [address], [place], [place] [address]    Report Status 01/21/2020 FINAL  Final    Coagulation Studies: No results for input(s): LABPROT, INR in the last 72 hours.  Urinalysis: No results for input(s): COLORURINE, LABSPEC, PHURINE, GLUCOSEU, HGBUR, BILIRUBINUR, KETONESUR, PROTEINUR, UROBILINOGEN, NITRITE, LEUKOCYTESUR in the last 72 hours.  Invalid input(s): APPERANCEUR    Imaging: No results found.   Medications:     heparin sodium (porcine), lidocaine, lidocaine  Assessment/ Plan:  74 y.o. male with a PMHx of acute respiratory failure status post COVID-19 infection status post tracheostomy placement, atrial fibrillation, hypertension, CVA, gout, hypertrophic cardiomyopathy, who was admitted to [facility] on 11/21/2019 for ongoing treatment of acute respiratory failure.  1.  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  Patient seen during dialysis treatment this AM.  Tolerating well.  Ultrafiltration target 2 kg.  Continue to use albumin for blood pressure support.  2.  Acute respiratory failure secondary to COVID-19 infection and its sequela.  -Patient is back on the ventilator this AM.  Weaning as per pulmonary/critical care.  3.  Hyperkalemia.  Potassium acceptable at 3.7.  4.  Hyponatremia.  Serum sodium a bit lower today at 133.  Continue to monitor.  Should improve with dialysis today.  5.  Anemia of chronic kidney disease.  Hemoglobin fluctuating between 7.4-7.6.  Consider blood transfusion  for hemoglobin of 7 or less.       LOS: 0 Mata Rowen 4/30/20218:19 AM

## 2020-01-26 NOTE — Progress Notes (Signed)
Pulmonary Critical Care Medicine [place]   PULMONARY CRITICAL CARE SERVICE  PROGRESS NOTE  Date of Service: 01/26/2020  Shane Banks  RCB:[id number]  DOB: Jul 14, 1946   DOA: 11/21/2019  Referring Physician: [name], MD  HPI: Shane Banks is a 74 y.o. male seen for follow up of Acute on Chronic Respiratory Failure.  Patient is currently on assist control mode has been on 50% FiO2 with good saturations  Medications: Reviewed on Rounds  Physical Exam:  Vitals: Temperature 99.9 pulse 59 respiratory rate 30 blood pressure is 120/61 saturations 100%  Ventilator Settings on assist control FiO2 50% tidal volume 500 with a PEEP of 5  . General: Comfortable at this time . Eyes: Grossly normal lids, irises & conjunctiva . ENT: grossly tongue is normal . Neck: no obvious mass . Cardiovascular: S1 S2 normal no gallop . Respiratory: No rhonchi coarse breath sounds are noted . Abdomen: soft . Skin: no rash seen on limited exam . Musculoskeletal: not rigid . Psychiatric:unable to assess . Neurologic: no seizure no involuntary movements         Lab Data:   Basic Metabolic Panel: Recent Labs  Lab 01/22/20 0742 01/24/20 0641 01/26/20 0505  NA 136 139 133*  K 4.2 4.0 3.7  CL 98 99 97*  CO2 28 28 30   GLUCOSE 136* 165* 163*  BUN 115* 109* 100*  CREATININE 2.15* 2.13* 1.70*  CALCIUM 9.9 10.2 10.0  MG 2.1 1.8 1.8  PHOS 2.7 2.6 2.9    ABG: No results for input(s): PHART, PCO2ART, PO2ART, HCO3, O2SAT in the last 168 hours.  Liver Function Tests: Recent Labs  Lab 01/22/20 0742 01/24/20 0641 01/26/20 0505  ALBUMIN 2.5* 2.6* 2.7*   No results for input(s): LIPASE, AMYLASE in the last 168 hours. No results for input(s): AMMONIA in the last 168 hours.  CBC: Recent Labs  Lab 01/20/20 1107 01/22/20 0742 01/24/20 0641 01/26/20 0505  WBC 22.3* 24.1* 23.2* 19.1*  HGB 7.5* 7.4* 7.6* 7.4*  HCT 24.8* 24.3* 24.8* 24.4*  MCV 86.7 86.8 86.4 86.8  PLT  227 244 242 223    Cardiac Enzymes: No results for input(s): CKTOTAL, CKMB, CKMBINDEX, TROPONINI in the last 168 hours.  BNP (last 3 results) No results for input(s): BNP in the last 8760 hours.  ProBNP (last 3 results) No results for input(s): PROBNP in the last 8760 hours.  Radiological Exams: No results found.  Assessment/Plan Active Problems:   Acute on chronic respiratory failure with hypoxia (HCC)   COVID-19 virus infection   Atrial fibrillation with RVR (HCC)   Severe sepsis (HCC)   Pneumonia due to COVID-19 virus   1. Acute on chronic respiratory failure hypoxia plan continue with full support this morning patient was getting dialysis was a little bit tired out some respiratory therapy will reassess again later in the day 2. COVID-19 virus infection treated resolved 3. Atrial fibrillation rate controlled 4. Severe sepsis hemodynamics are stable 5. Pneumonia due to COVID-19 we will continue with supportive care   I have personally seen and evaluated the patient, evaluated laboratory and imaging results, formulated the assessment and plan and placed orders. The Patient requires high complexity decision making with multiple systems involvement.  Rounds were done with the Respiratory Therapy Director and Staff therapists and discussed with nursing staff also.  Allyne Gee, MD [facility] Pulmonary Critical Care Medicine Sleep Medicine

## 2020-01-27 DIAGNOSIS — U071 COVID-19: Secondary | ICD-10-CM | POA: Diagnosis not present

## 2020-01-27 DIAGNOSIS — J9621 Acute and chronic respiratory failure with hypoxia: Secondary | ICD-10-CM | POA: Diagnosis not present

## 2020-01-27 DIAGNOSIS — I4891 Unspecified atrial fibrillation: Secondary | ICD-10-CM | POA: Diagnosis not present

## 2020-01-27 DIAGNOSIS — N179 Acute kidney failure, unspecified: Secondary | ICD-10-CM | POA: Diagnosis not present

## 2020-01-27 NOTE — Progress Notes (Signed)
[facility]   PULMONARY CRITICAL CARE SERVICE  PROGRESS NOTE  Date of Service: 01/27/2020  Shane Banks  XAJ:[id number]  DOB: 1946-09-27   DOA: 11/21/2019  Referring Physician: [name], MD  HPI: Shane Banks is a 74 y.o. male seen for follow up of Acute on Chronic Respiratory Failure.  Patient at this time is on the NAG has been on 4 L of oxygen is actually been doing well and was put on the ventilator overnight  Medications: Reviewed on Rounds  Physical Exam:  Vitals: Temperature is 96.1 pulse 94 respiratory 29 blood pressure is 130/59 saturations 99%  Ventilator Settings on the ratio 4 L of oxygen good saturations are noted  . General: Comfortable at this time . Eyes: Grossly normal lids, irises & conjunctiva . ENT: grossly tongue is normal . Neck: no obvious mass . Cardiovascular: S1 S2 normal no gallop . Respiratory: No rhonchi coarse breath sounds at this time . Abdomen: soft . Skin: no rash seen on limited exam . Musculoskeletal: not rigid . Psychiatric:unable to assess . Neurologic: no seizure no involuntary movements         Lab Data:   Basic Metabolic Panel: Recent Labs  Lab 01/22/20 0742 01/24/20 0641 01/26/20 0505  NA 136 139 133*  K 4.2 4.0 3.7  CL 98 99 97*  CO2 28 28 30   GLUCOSE 136* 165* 163*  BUN 115* 109* 100*  CREATININE 2.15* 2.13* 1.70*  CALCIUM 9.9 10.2 10.0  MG 2.1 1.8 1.8  PHOS 2.7 2.6 2.9    ABG: No results for input(s): PHART, PCO2ART, PO2ART, HCO3, O2SAT in the last 168 hours.  Liver Function Tests: Recent Labs  Lab 01/22/20 0742 01/24/20 0641 01/26/20 0505  ALBUMIN 2.5* 2.6* 2.7*   No results for input(s): LIPASE, AMYLASE in the last 168 hours. No results for input(s): AMMONIA in the last 168 hours.  CBC: Recent Labs  Lab 01/22/20 0742 01/24/20 0641 01/26/20 0505  WBC 24.1* 23.2* 19.1*  HGB 7.4* 7.6* 7.4*  HCT 24.3* 24.8* 24.4*  MCV 86.8 86.4 86.8  PLT 244  242 223    Cardiac Enzymes: No results for input(s): CKTOTAL, CKMB, CKMBINDEX, TROPONINI in the last 168 hours.  BNP (last 3 results) No results for input(s): BNP in the last 8760 hours.  ProBNP (last 3 results) No results for input(s): PROBNP in the last 8760 hours.  Radiological Exams: No results found.  Assessment/Plan Active Problems:   Acute on chronic respiratory failure with hypoxia (HCC)   COVID-19 virus infection   Atrial fibrillation with RVR (HCC)   Severe sepsis (HCC)   Pneumonia due to COVID-19 virus   1. Acute on chronic respiratory failure hypoxia plan is going to be to continue with the wean protocol on the NAG patient is doing 4 L of oxygen with the NAG we will continue to advance as tolerated. 2. COVID-19 virus infection resolved we will continue with supportive care 3. Atrial fibrillation rate controlled 4. Severe sepsis right now hemodynamics are stable 5. Pneumonia due to COVID-19 treated clinically improved we will continue to monitor   I have personally seen and evaluated the patient, evaluated laboratory and imaging results, formulated the assessment and plan and placed orders. The Patient requires high complexity decision making with multiple systems involvement.  Rounds were done with the Respiratory Therapy Director and Staff therapists and discussed with nursing staff also.  Allyne Gee, MD [facility] Pulmonary Critical Care Medicine Sleep Medicine

## 2020-01-28 DIAGNOSIS — U071 COVID-19: Secondary | ICD-10-CM | POA: Diagnosis not present

## 2020-01-28 DIAGNOSIS — J9621 Acute and chronic respiratory failure with hypoxia: Secondary | ICD-10-CM | POA: Diagnosis not present

## 2020-01-28 DIAGNOSIS — I4891 Unspecified atrial fibrillation: Secondary | ICD-10-CM | POA: Diagnosis not present

## 2020-01-28 DIAGNOSIS — N179 Acute kidney failure, unspecified: Secondary | ICD-10-CM | POA: Diagnosis not present

## 2020-01-28 NOTE — Progress Notes (Signed)
Pulmonary Critical Care Medicine [place]   PULMONARY CRITICAL CARE SERVICE  PROGRESS NOTE  Date of Service: 01/28/2020  Shane Banks  ACZ:[id number]  DOB: 1946-01-26   DOA: 11/21/2019  Referring Physician: [name], MD  HPI: Shane Banks is a 74 y.o. male seen for follow up of Acute on Chronic Respiratory Failure.  Patient currently is off the ventilator on the NAG has been on 4 L of oxygen today will be 24 hours  Medications: Reviewed on Rounds  Physical Exam:  Vitals: Temperature is 96.8 pulse 60 respiratory 21 blood pressure is 109/57 saturations 100%  Ventilator Settings off ventilator on the NAG on 4 L  . General: Comfortable at this time . Eyes: Grossly normal lids, irises & conjunctiva . ENT: grossly tongue is normal . Neck: no obvious mass . Cardiovascular: S1 S2 normal no gallop . Respiratory: No rhonchi no rales are noted pulse 20 . Abdomen: soft . Skin: no rash seen on limited exam . Musculoskeletal: not rigid . Psychiatric:unable to assess . Neurologic: no seizure no involuntary movements         Lab Data:   Basic Metabolic Panel: Recent Labs  Lab 01/22/20 0742 01/24/20 0641 01/26/20 0505  NA 136 139 133*  K 4.2 4.0 3.7  CL 98 99 97*  CO2 28 28 30   GLUCOSE 136* 165* 163*  BUN 115* 109* 100*  CREATININE 2.15* 2.13* 1.70*  CALCIUM 9.9 10.2 10.0  MG 2.1 1.8 1.8  PHOS 2.7 2.6 2.9    ABG: No results for input(s): PHART, PCO2ART, PO2ART, HCO3, O2SAT in the last 168 hours.  Liver Function Tests: Recent Labs  Lab 01/22/20 0742 01/24/20 0641 01/26/20 0505  ALBUMIN 2.5* 2.6* 2.7*   No results for input(s): LIPASE, AMYLASE in the last 168 hours. No results for input(s): AMMONIA in the last 168 hours.  CBC: Recent Labs  Lab 01/22/20 0742 01/24/20 0641 01/26/20 0505  WBC 24.1* 23.2* 19.1*  HGB 7.4* 7.6* 7.4*  HCT 24.3* 24.8* 24.4*  MCV 86.8 86.4 86.8  PLT 244 242 223    Cardiac Enzymes: No results for input(s):  CKTOTAL, CKMB, CKMBINDEX, TROPONINI in the last 168 hours.  BNP (last 3 results) No results for input(s): BNP in the last 8760 hours.  ProBNP (last 3 results) No results for input(s): PROBNP in the last 8760 hours.  Radiological Exams: No results found.  Assessment/Plan Active Problems:   Acute on chronic respiratory failure with hypoxia (HCC)   COVID-19 virus infection   Atrial fibrillation with RVR (HCC)   Severe sepsis (HCC)   Pneumonia due to COVID-19 virus   1. Acute on chronic respiratory failure hypoxia plan is to continue with NAG at this time.  Patient currently is on 4 L of oxygen the goal is for 24 hours 2. COVID-19 virus infection resolved continue with supportive care 3. Atrial fibrillation rate controlled 4. Severe sepsis resolved 5. Pneumonia due to COVID-19 treated we will continue with present management supportive care   I have personally seen and evaluated the patient, evaluated laboratory and imaging results, formulated the assessment and plan and placed orders. The Patient requires high complexity decision making with multiple systems involvement.  Rounds were done with the Respiratory Therapy Director and Staff therapists and discussed with nursing staff also.  Allyne Gee, MD [facility] Pulmonary Critical Care Medicine Sleep Medicine

## 2020-01-29 DIAGNOSIS — I4891 Unspecified atrial fibrillation: Secondary | ICD-10-CM | POA: Diagnosis not present

## 2020-01-29 DIAGNOSIS — N179 Acute kidney failure, unspecified: Secondary | ICD-10-CM | POA: Diagnosis not present

## 2020-01-29 DIAGNOSIS — U071 COVID-19: Secondary | ICD-10-CM | POA: Diagnosis not present

## 2020-01-29 DIAGNOSIS — J9621 Acute and chronic respiratory failure with hypoxia: Secondary | ICD-10-CM | POA: Diagnosis not present

## 2020-01-29 LAB — RENAL FUNCTION PANEL
Albumin: 2.6 g/dL — ABNORMAL LOW (ref 3.5–5.0)
Anion gap: 10 (ref 5–15)
BUN: 125 mg/dL — ABNORMAL HIGH (ref 8–23)
CO2: 29 mmol/L (ref 22–32)
Calcium: 10.3 mg/dL (ref 8.9–10.3)
Chloride: 99 mmol/L (ref 98–111)
Creatinine, Ser: 1.99 mg/dL — ABNORMAL HIGH (ref 0.61–1.24)
GFR calc Af Amer: 38 mL/min — ABNORMAL LOW (ref >60)
GFR calc non Af Amer: 33 mL/min — ABNORMAL LOW (ref >60)
Glucose, Bld: 121 mg/dL — ABNORMAL HIGH (ref 70–99)
Phosphorus: 3.7 mg/dL (ref 2.5–4.6)
Potassium: 3.5 mmol/L (ref 3.5–5.1)
Sodium: 138 mmol/L (ref 135–145)

## 2020-01-29 LAB — CBC
HCT: 23.1 % — ABNORMAL LOW (ref 39.0–52.0)
Hemoglobin: 7.1 g/dL — ABNORMAL LOW (ref 13.0–17.0)
MCH: 26.3 pg (ref 26.0–34.0)
MCHC: 30.7 g/dL (ref 30.0–36.0)
MCV: 85.6 fL (ref 80.0–100.0)
Platelets: 236 10*3/uL (ref 150–400)
RBC: 2.7 MIL/uL — ABNORMAL LOW (ref 4.22–5.81)
RDW: 17.4 % — ABNORMAL HIGH (ref 11.5–15.5)
WBC: 14.6 10*3/uL — ABNORMAL HIGH (ref 4.0–10.5)
nRBC: 0 % (ref 0.0–0.2)

## 2020-01-29 LAB — MAGNESIUM: Magnesium: 1.7 mg/dL (ref 1.7–2.4)

## 2020-01-29 NOTE — Progress Notes (Signed)
Let me know [facility]  ROUNDING NOTE   Subjective:  Patient seen and evaluated at bedside. Due for dialysis treatment today. Currently off ventilator.  Objective:  Vital signs in last 24 hours:  Temperature 97.8 pulse 70 respirations 34 blood pressure 132/60  Physical Exam: General: Critically ill-appearing  Head: Normocephalic, atraumatic. Moist oral mucosal membranes  Eyes: Anicteric  Neck: Tracheostomy in place  Lungs:  Scattered rhonchi, normal effort  Heart: S1S2 no rubs  Abdomen:  Soft, nontender, bowel sounds present, PEG tube in place  Extremities: 2+ peripheral edema  Neurologic: Awake, alert  Skin: No rash  Access: Right IJ temporary dialysis catheter    Basic Metabolic Panel: Recent Labs  Lab 01/24/20 0641 01/26/20 0505 01/29/20 0700  NA 139 133* 138  K 4.0 3.7 3.5  CL 99 97* 99  CO2 28 30 29   GLUCOSE 165* 163* 121*  BUN 109* 100* 125*  CREATININE 2.13* 1.70* 1.99*  CALCIUM 10.2 10.0 10.3  MG 1.8 1.8 1.7  PHOS 2.6 2.9 3.7    Liver Function Tests: Recent Labs  Lab 01/24/20 0641 01/26/20 0505 01/29/20 0700  ALBUMIN 2.6* 2.7* 2.6*   No results for input(s): LIPASE, AMYLASE in the last 168 hours. No results for input(s): AMMONIA in the last 168 hours.  CBC: Recent Labs  Lab 01/24/20 0641 01/26/20 0505 01/29/20 0700  WBC 23.2* 19.1* 14.6*  HGB 7.6* 7.4* 7.1*  HCT 24.8* 24.4* 23.1*  MCV 86.4 86.8 85.6  PLT 242 223 236    Cardiac Enzymes: No results for input(s): CKTOTAL, CKMB, CKMBINDEX, TROPONINI in the last 168 hours.  BNP: Invalid input(s): POCBNP  CBG: No results for input(s): GLUCAP in the last 168 hours.  Microbiology: Results for orders placed or performed during the hospital encounter of 11/21/19  Culture, Urine     Status: Abnormal   Collection Time: 11/22/19  1:25 PM   Specimen: Urine, Random  Result Value Ref Range Status   Specimen Description URINE, RANDOM  Final   Special Requests   Final     NONE Performed at [facility], [address], [place], [place] [address]    Culture 50,000 COLONIES/mL YEAST (A)  Final   Report Status 11/23/2019 FINAL  Final  Culture, blood (routine x 2)     Status: None   Collection Time: 11/22/19  1:40 PM   Specimen: BLOOD LEFT HAND  Result Value Ref Range Status   Specimen Description BLOOD LEFT HAND  Final   Special Requests   Final    BOTTLES DRAWN AEROBIC ONLY Blood Culture results may not be optimal due to an inadequate volume of blood received in culture bottles   Culture   Final    NO GROWTH 5 DAYS Performed at [facility], [place] [address], [place], [place] [address]    Report Status 11/27/2019 FINAL  Final  Culture, blood (routine x 2)     Status: None   Collection Time: 11/22/19  1:44 PM   Specimen: BLOOD LEFT HAND  Result Value Ref Range Status   Specimen Description BLOOD LEFT HAND  Final   Special Requests   Final    BOTTLES DRAWN AEROBIC ONLY Blood Culture results may not be optimal due to an inadequate volume of blood received in culture bottles   Culture   Final    NO GROWTH 5 DAYS Performed at [facility], [place] [address], [place],  [address]    Report Status 11/27/2019  FINAL  Final  Culture, respiratory (non-expectorated)     Status: None   Collection Time: 11/22/19  4:59 PM   Specimen: Tracheal Aspirate; Respiratory  Result Value Ref Range Status   Specimen Description TRACHEAL ASPIRATE  Final   Special Requests NONE  Final   Gram Stain   Final    ABUNDANT WBC PRESENT, PREDOMINANTLY PMN NO ORGANISMS SEEN    Culture   Final    NO GROWTH 2 DAYS Performed at [facility], [address], [place], [place] [address]    Report Status 11/24/2019 FINAL  Final  Culture, respiratory (non-expectorated)     Status: None   Collection Time: 12/04/19  2:22 AM   Specimen: Tracheal Aspirate; Respiratory  Result Value Ref Range Status   Specimen Description TRACHEAL ASPIRATE  Final   Special  Requests NONE  Final   Gram Stain   Final    ABUNDANT WBC PRESENT, PREDOMINANTLY PMN ABUNDANT GRAM NEGATIVE RODS Performed at [facility], [address] [place], [place] [address]    Culture   Final    MODERATE PSEUDOMONAS AERUGINOSA FEW ESCHERICHIA COLI Confirmed Extended Spectrum Beta-Lactamase Producer (ESBL).  In bloodstream infections from ESBL organisms, carbapenems are preferred over piperacillin/tazobactam. They are shown to have a lower risk of mortality.    Report Status 12/09/2019 FINAL  Final   Organism ID, Bacteria PSEUDOMONAS AERUGINOSA  Final   Organism ID, Bacteria ESCHERICHIA COLI  Final      Susceptibility   Escherichia coli - MIC*    AMPICILLIN >=32 RESISTANT Resistant     CEFAZOLIN >=64 RESISTANT Resistant     CEFEPIME 2 SENSITIVE Sensitive     CEFTAZIDIME RESISTANT Resistant     CEFTRIAXONE >=64 RESISTANT Resistant     CIPROFLOXACIN <=0.25 SENSITIVE Sensitive     GENTAMICIN >=16 RESISTANT Resistant     IMIPENEM <=0.25 SENSITIVE Sensitive     TRIMETH/SULFA >=320 RESISTANT Resistant     AMPICILLIN/SULBACTAM 16 INTERMEDIATE Intermediate     PIP/TAZO <=4 SENSITIVE Sensitive     * FEW ESCHERICHIA COLI   Pseudomonas aeruginosa - MIC*    CEFTAZIDIME 16 INTERMEDIATE Intermediate     CIPROFLOXACIN >=4 RESISTANT Resistant     GENTAMICIN 2 SENSITIVE Sensitive     IMIPENEM >=16 RESISTANT Resistant     * MODERATE PSEUDOMONAS AERUGINOSA  Culture, Urine     Status: Abnormal   Collection Time: 12/04/19  6:33 PM   Specimen: Urine, Random  Result Value Ref Range Status   Specimen Description URINE, RANDOM  Final   Special Requests   Final    NONE Performed at [facility], [address] [place], [place] [address]    Culture >=100,000 COLONIES/mL PSEUDOMONAS AERUGINOSA (A)  Final   Report Status 12/06/2019 FINAL  Final   Organism ID, Bacteria PSEUDOMONAS AERUGINOSA (A)  Final      Susceptibility   Pseudomonas aeruginosa - MIC*    CEFTAZIDIME 4  SENSITIVE Sensitive     CIPROFLOXACIN >=4 RESISTANT Resistant     GENTAMICIN 4 SENSITIVE Sensitive     IMIPENEM 2 SENSITIVE Sensitive     PIP/TAZO 16 SENSITIVE Sensitive     * >=100,000 COLONIES/mL PSEUDOMONAS AERUGINOSA  Culture, blood (routine x 2)     Status: Abnormal   Collection Time: 12/11/19  9:39 AM   Specimen: BLOOD LEFT HAND  Result Value Ref Range Status   Specimen Description BLOOD LEFT HAND  Final   Special Requests   Final  BOTTLES DRAWN AEROBIC AND ANAEROBIC Blood Culture adequate volume   Culture  Setup Time   Final    GRAM POSITIVE COCCI IN CLUSTERS ANAEROBIC BOTTLE ONLY CRITICAL RESULT CALLED TO, READ BACK BY AND VERIFIED WITH: RN R Weir [id number] AT 47 BY CM    Culture (A)  Final    STAPHYLOCOCCUS SPECIES (COAGULASE NEGATIVE) THE SIGNIFICANCE OF ISOLATING THIS ORGANISM FROM A SINGLE SET OF BLOOD CULTURES WHEN MULTIPLE SETS ARE DRAWN IS UNCERTAIN. PLEASE NOTIFY THE MICROBIOLOGY DEPARTMENT WITHIN ONE WEEK IF SPECIATION AND SENSITIVITIES ARE REQUIRED. Performed at [facility], [place] [address], [place], [place] [address]    Report Status 12/14/2019 FINAL  Final  Culture, blood (routine x 2)     Status: None   Collection Time: 12/11/19  9:47 AM   Specimen: BLOOD  Result Value Ref Range Status   Specimen Description BLOOD RIGHT ANTECUBITAL  Final   Special Requests   Final    BOTTLES DRAWN AEROBIC AND ANAEROBIC Blood Culture adequate volume   Culture  Setup Time   Final    CORRECTED RESULTS NO ORGANISMS SEEN PREVIOUSLY REPORTED AS: GRAM POSITIVE COCCI IN CLUSTERS CORRECTED RESULTS CALLED TO: RN R CUMMINGS [id number] AT 920 AM BY CM    Culture   Final    NO GROWTH 5 DAYS Performed at [facility], [place] [address] [place], [place] [address]    Report Status 12/16/2019 FINAL  Final  Culture, respiratory (non-expectorated)     Status: None   Collection Time: 12/11/19 10:27 AM   Specimen: Tracheal Aspirate; Respiratory  Result Value Ref Range Status    Specimen Description TRACHEAL ASPIRATE  Final   Special Requests NONE  Final   Gram Stain NO WBC SEEN NO ORGANISMS SEEN   Final   Culture   Final    FEW PSEUDOMONAS AERUGINOSA FEW STENOTROPHOMONAS MALTOPHILIA SEE SEPARATE REPORT IN [facility] FOR DOS 12/11/19. Performed at [facility], [place] [address] [place], [place] [address]    Report Status 12/26/2019 FINAL  Final   Organism ID, Bacteria PSEUDOMONAS AERUGINOSA  Final   Organism ID, Bacteria STENOTROPHOMONAS MALTOPHILIA  Final      Susceptibility   Pseudomonas aeruginosa - MIC*    CEFTAZIDIME 16 INTERMEDIATE Intermediate     CIPROFLOXACIN >=4 RESISTANT Resistant     GENTAMICIN 2 SENSITIVE Sensitive     IMIPENEM >=16 RESISTANT Resistant     * FEW PSEUDOMONAS AERUGINOSA   Stenotrophomonas maltophilia - MIC*    LEVOFLOXACIN 0.5 SENSITIVE Sensitive     TRIMETH/SULFA <=20 SENSITIVE Sensitive     * FEW STENOTROPHOMONAS MALTOPHILIA  Culture, Urine     Status: Abnormal   Collection Time: 12/11/19 11:30 AM   Specimen: Urine, Random  Result Value Ref Range Status   Specimen Description URINE, RANDOM  Final   Special Requests NONE  Final   Culture (A)  Final    <10,000 COLONIES/mL INSIGNIFICANT GROWTH Performed at [facility], [place] [address] [place], [place] [address]    Report Status 12/12/2019 FINAL  Final  Culture, blood (routine x 2)     Status: None   Collection Time: 12/15/19 12:37 PM   Specimen: BLOOD LEFT HAND  Result Value Ref Range Status   Specimen Description BLOOD LEFT HAND  Final   Special Requests   Final    BOTTLES DRAWN AEROBIC AND ANAEROBIC Blood Culture adequate volume   Culture   Final    NO GROWTH 5 DAYS Performed  at [facility], [place] [address], [place], [place] [address]    Report Status 12/20/2019 FINAL  Final  Culture, blood (routine x 2)     Status: None   Collection Time: 12/15/19 12:42 PM   Specimen: BLOOD LEFT HAND  Result Value Ref Range Status   Specimen Description BLOOD LEFT  HAND  Final   Special Requests   Final    BOTTLES DRAWN AEROBIC AND ANAEROBIC Blood Culture adequate volume   Culture   Final    NO GROWTH 5 DAYS Performed at [facility], [place] [address] [place], [place] [address]    Report Status 12/20/2019 FINAL  Final  C Difficile Quick Screen w PCR reflex     Status: None   Collection Time: 12/17/19  7:00 PM  Result Value Ref Range Status   C Diff antigen NEGATIVE NEGATIVE Final   C Diff toxin NEGATIVE NEGATIVE Final   C Diff interpretation No C. difficile detected.  Final    Comment: Performed at [facility], [place] [address] [place], [place] [address]  SARS CORONAVIRUS 2 (TAT 6-24 HRS) Nasopharyngeal Nasopharyngeal Swab     Status: None   Collection Time: 12/20/19 11:45 AM   Specimen: Nasopharyngeal Swab  Result Value Ref Range Status   SARS Coronavirus 2 NEGATIVE NEGATIVE Final    Comment: (NOTE) SARS-CoV-2 target nucleic acids are NOT DETECTED. The SARS-CoV-2 RNA is generally detectable in upper and lower respiratory specimens during the acute phase of infection. Negative results do not preclude SARS-CoV-2 infection, do not rule out co-infections with other pathogens, and should not be used as the sole basis for treatment or other patient management decisions. Negative results must be combined with clinical observations, patient history, and epidemiological information. The expected result is Negative. Fact Sheet for Patients: SugarRoll.be Fact Sheet for Healthcare Providers: https://www.woods-mathews.com/ This test is not yet approved or cleared by the [place] FDA and  has been authorized for detection and/or diagnosis of SARS-CoV-2 by FDA under an Emergency Use Authorization (EUA). This EUA will remain  in effect (meaning this test can be used) for the duration of the COVID-19 declaration under Section 56 4(b)(1) of the Act, 21 U.S.C. section 360bbb-3(b)(1), unless the  authorization is terminated or revoked sooner. Performed at [facility], [place] [address] [place], [place] [address]   Body fluid culture     Status: None   Collection Time: 12/21/19 12:10 PM   Specimen: Lung, Left; Pleural Fluid  Result Value Ref Range Status   Specimen Description PLEURAL FLUID  Final   Special Requests LEFT LUNG THORA  Final   Gram Stain   Final    RARE WBC PRESENT, PREDOMINANTLY MONONUCLEAR NO ORGANISMS SEEN    Culture   Final    NO GROWTH 3 DAYS Performed at [facility], [place] [address], [place], [place] [address]    Report Status 12/24/2019 FINAL  Final  Culture, blood (routine x 2)     Status: None   Collection Time: 12/25/19  2:16 PM   Specimen: BLOOD RIGHT HAND  Result Value Ref Range Status   Specimen Description BLOOD RIGHT HAND  Final   Special Requests   Final    BOTTLES DRAWN AEROBIC ONLY Blood Culture adequate volume   Culture   Final    NO GROWTH 5 DAYS Performed at [facility], [place] [address], [place], [place] [address]    Report Status 01/01/2020 FINAL  Final  Culture, blood (routine x 2)     Status: None   Collection Time: 12/25/19  2:18 PM   Specimen: BLOOD LEFT HAND  Result Value Ref Range Status   Specimen Description BLOOD LEFT HAND  Final   Special Requests   Final    BOTTLES DRAWN AEROBIC ONLY Blood Culture adequate volume   Culture   Final    NO GROWTH 5 DAYS Performed at [facility], [address], [place], [place] [address]    Report Status 01/01/2020 FINAL  Final  Culture, respiratory (non-expectorated)     Status: None   Collection Time: 12/25/19  4:20 PM   Specimen: Tracheal Aspirate; Respiratory  Result Value Ref Range Status   Specimen Description TRACHEAL ASPIRATE  Final   Special Requests NONE  Final   Gram Stain   Final    ABUNDANT WBC PRESENT, PREDOMINANTLY PMN NO ORGANISMS SEEN    Culture   Final    RARE Consistent with normal respiratory flora. Performed at [facility], [place] [address] [place], [place] [address]    Report Status 12/28/2019 FINAL  Final  Culture, respiratory (non-expectorated)     Status: None   Collection Time: 01/12/20  4:14 PM   Specimen: Tracheal Aspirate; Respiratory  Result Value Ref Range Status   Specimen Description TRACHEAL ASPIRATE  Final   Special Requests NONE  Final   Gram Stain   Final    ABUNDANT WBC PRESENT, PREDOMINANTLY PMN ABUNDANT GRAM NEGATIVE RODS RARE GRAM POSITIVE RODS Performed at [facility], [place] [address], [place], [place] [address]    Culture   Final    ABUNDANT PSEUDOMONAS AERUGINOSA FEW STENOTROPHOMONAS MALTOPHILIA    Report Status 01/16/2020 FINAL  Final   Organism ID, Bacteria PSEUDOMONAS AERUGINOSA  Final   Organism ID, Bacteria STENOTROPHOMONAS MALTOPHILIA  Final      Susceptibility   Pseudomonas aeruginosa - MIC*    CEFTAZIDIME 16 INTERMEDIATE Intermediate     CIPROFLOXACIN >=4 RESISTANT Resistant     GENTAMICIN 2 SENSITIVE Sensitive     IMIPENEM 2 SENSITIVE Sensitive     * ABUNDANT PSEUDOMONAS AERUGINOSA   Stenotrophomonas maltophilia - MIC*    LEVOFLOXACIN 0.5 SENSITIVE Sensitive     TRIMETH/SULFA <=20 SENSITIVE Sensitive     * FEW STENOTROPHOMONAS MALTOPHILIA  Culture, blood (Routine X 2) w Reflex to ID Panel     Status: None   Collection Time: 01/16/20  3:30 PM   Specimen: BLOOD LEFT HAND  Result Value Ref Range Status   Specimen Description BLOOD LEFT HAND  Final   Special Requests   Final    BOTTLES DRAWN AEROBIC AND ANAEROBIC Blood Culture adequate volume   Culture   Final    NO GROWTH 5 DAYS Performed at [facility], [address] [place], [place] [address]    Report Status 01/21/2020 FINAL  Final  Culture, blood (Routine X 2) w Reflex to ID Panel     Status: None   Collection Time: 01/16/20  3:32 PM   Specimen: BLOOD LEFT ARM  Result Value Ref Range Status   Specimen Description BLOOD LEFT ARM  Final   Special Requests   Final    BOTTLES DRAWN AEROBIC AND  ANAEROBIC Blood Culture adequate volume   Culture   Final    NO GROWTH 5 DAYS Performed at [facility], [place] [address], [place],  [address]    Report Status 01/21/2020 FINAL  Final    Coagulation Studies: No results for input(s): LABPROT, INR in the last 72 hours.  Urinalysis: No results for input(s): COLORURINE, LABSPEC, PHURINE, GLUCOSEU, HGBUR, BILIRUBINUR, KETONESUR, PROTEINUR, UROBILINOGEN, NITRITE, LEUKOCYTESUR in the last 72 hours.  Invalid input(s): APPERANCEUR    Imaging: No results found.   Medications:     heparin sodium (porcine), lidocaine, lidocaine  Assessment/ Plan:  74 y.o. male with a PMHx of acute respiratory failure status post COVID-19 infection status post tracheostomy placement, atrial fibrillation, hypertension, CVA, gout, hypertrophic cardiomyopathy, who was admitted to [facility] on 11/21/2019 for ongoing treatment of acute respiratory failure.  1.  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  Patient due for dialysis per MWF treatment schedule.  2.  Acute respiratory failure secondary to COVID-19 infection and its sequela.  -Patient currently weaned off of the ventilator and breathing comfortably through air/trach collar.  3.  Hyperkalemia.  Resolved, potassium 3.5.  4.  Hyponatremia.  Serum sodium improved to 138.  Continue to monitor periodically.  5.  Anemia of chronic kidney disease.  Hemoglobin down to 7.1 today.  Consider transfusion for hemoglobin of 7 or less.       LOS: 0 Omar Orrego 5/3/20218:16 AM

## 2020-01-29 NOTE — Progress Notes (Signed)
Pulmonary [place]   PULMONARY CRITICAL CARE SERVICE  PROGRESS NOTE  Date of Service: 01/29/2020  Harriet Bollen  BDZ:[id number]  DOB: 1946/08/11   DOA: 11/21/2019  Referring Physician: [name], MD  HPI: Quang Thorpe is a 74 y.o. male seen for follow up of Acute on Chronic Respiratory Failure.  Patient currently is on the NAG we can go ahead and switch over to T collar today right now the goal is going to be for 48 hours  Medications: Reviewed on Rounds  Physical Exam:  Vitals: Temperature 97.8 pulse 70 respiratory 34 blood pressure is 132/60 saturations 100%  Ventilator Settings on the NAG on 3 L  . General: Comfortable at this time . Eyes: Grossly normal lids, irises & conjunctiva . ENT: grossly tongue is normal . Neck: no obvious mass . Cardiovascular: S1 S2 normal no gallop . Respiratory: No rhonchi coarse breath sounds . Abdomen: soft . Skin: no rash seen on limited exam . Musculoskeletal: not rigid . Psychiatric:unable to assess . Neurologic: no seizure no involuntary movements         Lab Data:   Basic Metabolic Panel: Recent Labs  Lab 01/24/20 0641 01/26/20 0505 01/29/20 0700  NA 139 133* 138  K 4.0 3.7 3.5  CL 99 97* 99  CO2 28 30 29   GLUCOSE 165* 163* 121*  BUN 109* 100* 125*  CREATININE 2.13* 1.70* 1.99*  CALCIUM 10.2 10.0 10.3  MG 1.8 1.8 1.7  PHOS 2.6 2.9 3.7    ABG: No results for input(s): PHART, PCO2ART, PO2ART, HCO3, O2SAT in the last 168 hours.  Liver Function Tests: Recent Labs  Lab 01/24/20 0641 01/26/20 0505 01/29/20 0700  ALBUMIN 2.6* 2.7* 2.6*   No results for input(s): LIPASE, AMYLASE in the last 168 hours. No results for input(s): AMMONIA in the last 168 hours.  CBC: Recent Labs  Lab 01/24/20 0641 01/26/20 0505 01/29/20 0700  WBC 23.2* 19.1* 14.6*  HGB 7.6* 7.4* 7.1*  HCT 24.8* 24.4* 23.1*  MCV 86.4 86.8 85.6  PLT 242 223 236    Cardiac Enzymes: No results for  input(s): CKTOTAL, CKMB, CKMBINDEX, TROPONINI in the last 168 hours.  BNP (last 3 results) No results for input(s): BNP in the last 8760 hours.  ProBNP (last 3 results) No results for input(s): PROBNP in the last 8760 hours.  Radiological Exams: No results found.  Assessment/Plan Active Problems:   Acute on chronic respiratory failure with hypoxia (HCC)   COVID-19 virus infection   Atrial fibrillation with RVR (HCC)   Severe sepsis (HCC)   Pneumonia due to COVID-19 virus   1. Acute on chronic respiratory failure hypoxia spoke on rounds with respiratory therapy to go ahead and switch the patient over to the T collar today we will continue with advancing.  Patient will be off the ventilator for 48 hours today 2. COVID-19 virus infection resolved continue with supportive care 3. Atrial fibrillation rate controlled 4. Severe sepsis resolved hemodynamics are stable 5. Pneumonia due to COVID-19 improving we will continue to monitor.   I have personally seen and evaluated the patient, evaluated laboratory and imaging results, formulated the assessment and plan and placed orders. The Patient requires high complexity decision making with multiple systems involvement.  Rounds were done with the Respiratory Therapy Director and Staff therapists and discussed with nursing staff also.  Allyne Gee, MD [facility] Pulmonary Critical Care Medicine Sleep Medicine

## 2020-01-30 DIAGNOSIS — I4891 Unspecified atrial fibrillation: Secondary | ICD-10-CM | POA: Diagnosis not present

## 2020-01-30 DIAGNOSIS — N179 Acute kidney failure, unspecified: Secondary | ICD-10-CM | POA: Diagnosis not present

## 2020-01-30 DIAGNOSIS — J9621 Acute and chronic respiratory failure with hypoxia: Secondary | ICD-10-CM | POA: Diagnosis not present

## 2020-01-30 DIAGNOSIS — U071 COVID-19: Secondary | ICD-10-CM | POA: Diagnosis not present

## 2020-01-30 LAB — MAGNESIUM: Magnesium: 1.9 mg/dL (ref 1.7–2.4)

## 2020-01-30 NOTE — Progress Notes (Signed)
Pulmonary [place]   PULMONARY CRITICAL CARE SERVICE  PROGRESS NOTE  Date of Service: 01/30/2020  Shane Banks  ZYS:[id number]  DOB: 02-16-1946   DOA: 11/21/2019  Referring Physician: [name], MD  HPI: Shane Banks is a 74 y.o. male seen for follow up of Acute on Chronic Respiratory Failure.  Patient is currently off the ventilator on the NAG has been on 3 L of O2  Medications: Reviewed on Rounds  Physical Exam:  Vitals: Temperature is 98.0 pulse 95 respiratory 35 blood pressure is 126/70 saturations 100%  Ventilator Settings on the NAG on 3 L of oxygen  . General: Comfortable at this time . Eyes: Grossly normal lids, irises & conjunctiva . ENT: grossly tongue is normal . Neck: no obvious mass . Cardiovascular: S1 S2 normal no gallop . Respiratory: No rhonchi coarse breath sounds are noted . Abdomen: soft . Skin: no rash seen on limited exam . Musculoskeletal: not rigid . Psychiatric:unable to assess . Neurologic: no seizure no involuntary movements         Lab Data:   Basic Metabolic Panel: Recent Labs  Lab 01/24/20 0641 01/26/20 0505 01/29/20 0700 01/30/20 0501  NA 139 133* 138  --   K 4.0 3.7 3.5  --   CL 99 97* 99  --   CO2 28 30 29   --   GLUCOSE 165* 163* 121*  --   BUN 109* 100* 125*  --   CREATININE 2.13* 1.70* 1.99*  --   CALCIUM 10.2 10.0 10.3  --   MG 1.8 1.8 1.7 1.9  PHOS 2.6 2.9 3.7  --     ABG: No results for input(s): PHART, PCO2ART, PO2ART, HCO3, O2SAT in the last 168 hours.  Liver Function Tests: Recent Labs  Lab 01/24/20 0641 01/26/20 0505 01/29/20 0700  ALBUMIN 2.6* 2.7* 2.6*   No results for input(s): LIPASE, AMYLASE in the last 168 hours. No results for input(s): AMMONIA in the last 168 hours.  CBC: Recent Labs  Lab 01/24/20 0641 01/26/20 0505 01/29/20 0700  WBC 23.2* 19.1* 14.6*  HGB 7.6* 7.4* 7.1*  HCT 24.8* 24.4* 23.1*  MCV 86.4 86.8 85.6  PLT 242 223 236     Cardiac Enzymes: No results for input(s): CKTOTAL, CKMB, CKMBINDEX, TROPONINI in the last 168 hours.  BNP (last 3 results) No results for input(s): BNP in the last 8760 hours.  ProBNP (last 3 results) No results for input(s): PROBNP in the last 8760 hours.  Radiological Exams: No results found.  Assessment/Plan Active Problems:   Acute on chronic respiratory failure with hypoxia (HCC)   COVID-19 virus infection   Atrial fibrillation with RVR (HCC)   Severe sepsis (HCC)   Pneumonia due to COVID-19 virus   1. Acute on chronic respiratory failure hypoxia we will continue off the ventilator on the NAG right now requiring 3 L which will be continued and will titrate as tolerated 2. COVID-19 virus infection resolved we will continue to follow 3. Atrial fibrillation rate is controlled 4. Severe sepsis resolved hemodynamics are stable 5. Pneumonia due to COVID-19 clinically improved   I have personally seen and evaluated the patient, evaluated laboratory and imaging results, formulated the assessment and plan and placed orders. The Patient requires high complexity decision making with multiple systems involvement.  Rounds were done with the Respiratory Therapy Director and Staff therapists and discussed with nursing staff also.  Allyne Gee, MD [facility] Pulmonary Critical Care Medicine Sleep Medicine

## 2020-01-31 DIAGNOSIS — I4891 Unspecified atrial fibrillation: Secondary | ICD-10-CM | POA: Diagnosis not present

## 2020-01-31 DIAGNOSIS — U071 COVID-19: Secondary | ICD-10-CM | POA: Diagnosis not present

## 2020-01-31 DIAGNOSIS — J9621 Acute and chronic respiratory failure with hypoxia: Secondary | ICD-10-CM | POA: Diagnosis not present

## 2020-01-31 DIAGNOSIS — A419 Sepsis, unspecified organism: Secondary | ICD-10-CM | POA: Diagnosis not present

## 2020-01-31 LAB — RENAL FUNCTION PANEL
Albumin: 2.6 g/dL — ABNORMAL LOW (ref 3.5–5.0)
Anion gap: 10 (ref 5–15)
BUN: 94 mg/dL — ABNORMAL HIGH (ref 8–23)
CO2: 32 mmol/L (ref 22–32)
Calcium: 9.9 mg/dL (ref 8.9–10.3)
Chloride: 96 mmol/L — ABNORMAL LOW (ref 98–111)
Creatinine, Ser: 1.59 mg/dL — ABNORMAL HIGH (ref 0.61–1.24)
GFR calc Af Amer: 50 mL/min — ABNORMAL LOW (ref >60)
GFR calc non Af Amer: 43 mL/min — ABNORMAL LOW (ref >60)
Glucose, Bld: 121 mg/dL — ABNORMAL HIGH (ref 70–99)
Phosphorus: 2.1 mg/dL — ABNORMAL LOW (ref 2.5–4.6)
Potassium: 3.4 mmol/L — ABNORMAL LOW (ref 3.5–5.1)
Sodium: 138 mmol/L (ref 135–145)

## 2020-01-31 LAB — CBC
HCT: 21.4 % — ABNORMAL LOW (ref 39.0–52.0)
Hemoglobin: 6.6 g/dL — CL (ref 13.0–17.0)
MCH: 26.5 pg (ref 26.0–34.0)
MCHC: 30.8 g/dL (ref 30.0–36.0)
MCV: 85.9 fL (ref 80.0–100.0)
Platelets: 231 10*3/uL (ref 150–400)
RBC: 2.49 MIL/uL — ABNORMAL LOW (ref 4.22–5.81)
RDW: 17.3 % — ABNORMAL HIGH (ref 11.5–15.5)
WBC: 13.9 10*3/uL — ABNORMAL HIGH (ref 4.0–10.5)
nRBC: 0 % (ref 0.0–0.2)

## 2020-01-31 LAB — MAGNESIUM: Magnesium: 1.9 mg/dL (ref 1.7–2.4)

## 2020-01-31 LAB — PREPARE RBC (CROSSMATCH)

## 2020-01-31 NOTE — Progress Notes (Signed)
Let me know [facility]  ROUNDING NOTE   Subjective:  Patient resting comfortably. Currently off the ventilator. Breathing comfortably. Completed dialysis treatment.  Objective:  Vital signs in last 24 hours:  Temperature 98 pulse 67 respirations 24 blood pressure 124/63  Physical Exam: General: Critically ill-appearing  Head: Normocephalic, atraumatic. Moist oral mucosal membranes  Eyes: Anicteric  Neck: Tracheostomy in place  Lungs:  Scattered rhonchi, normal effort  Heart: S1S2 no rubs  Abdomen:  Soft, nontender, bowel sounds present, PEG tube in place  Extremities: 2+ peripheral edema  Neurologic: Awake, alert  Skin: No rash  Access: Right IJ temporary dialysis catheter    Basic Metabolic Panel: Recent Labs  Lab 01/26/20 0505 01/29/20 0700 01/30/20 0501 01/31/20 0500  NA 133* 138  --  138  K 3.7 3.5  --  3.4*  CL 97* 99  --  96*  CO2 30 29  --  32  GLUCOSE 163* 121*  --  121*  BUN 100* 125*  --  94*  CREATININE 1.70* 1.99*  --  1.59*  CALCIUM 10.0 10.3  --  9.9  MG 1.8 1.7 1.9 1.9  PHOS 2.9 3.7  --  2.1*    Liver Function Tests: Recent Labs  Lab 01/26/20 0505 01/29/20 0700 01/31/20 0500  ALBUMIN 2.7* 2.6* 2.6*   No results for input(s): LIPASE, AMYLASE in the last 168 hours. No results for input(s): AMMONIA in the last 168 hours.  CBC: Recent Labs  Lab 01/26/20 0505 01/29/20 0700 01/31/20 0500  WBC 19.1* 14.6* 13.9*  HGB 7.4* 7.1* 6.6*  HCT 24.4* 23.1* 21.4*  MCV 86.8 85.6 85.9  PLT 223 236 231    Cardiac Enzymes: No results for input(s): CKTOTAL, CKMB, CKMBINDEX, TROPONINI in the last 168 hours.  BNP: Invalid input(s): POCBNP  CBG: No results for input(s): GLUCAP in the last 168 hours.  Microbiology: Results for orders placed or performed during the hospital encounter of 11/21/19  Culture, Urine     Status: Abnormal   Collection Time: 11/22/19  1:25 PM   Specimen: Urine, Random  Result Value Ref Range Status    Specimen Description URINE, RANDOM  Final   Special Requests   Final    NONE Performed at [facility], [address] [place], [place] [address]    Culture 50,000 COLONIES/mL YEAST (A)  Final   Report Status 11/23/2019 FINAL  Final  Culture, blood (routine x 2)     Status: None   Collection Time: 11/22/19  1:40 PM   Specimen: BLOOD LEFT HAND  Result Value Ref Range Status   Specimen Description BLOOD LEFT HAND  Final   Special Requests   Final    BOTTLES DRAWN AEROBIC ONLY Blood Culture results may not be optimal due to an inadequate volume of blood received in culture bottles   Culture   Final    NO GROWTH 5 DAYS Performed at [facility], [place] [address], [place], [place] [address]    Report Status 11/27/2019 FINAL  Final  Culture, blood (routine x 2)     Status: None   Collection Time: 11/22/19  1:44 PM   Specimen: BLOOD LEFT HAND  Result Value Ref Range Status   Specimen Description BLOOD LEFT HAND  Final   Special Requests   Final    BOTTLES DRAWN AEROBIC ONLY Blood Culture results may not be optimal due to an inadequate volume of blood received in culture bottles   Culture  Final    NO GROWTH 5 DAYS Performed at [facility], [address] [address], [place], [place] [address]    Report Status 11/27/2019 FINAL  Final  Culture, respiratory (non-expectorated)     Status: None   Collection Time: 11/22/19  4:59 PM   Specimen: Tracheal Aspirate; Respiratory  Result Value Ref Range Status   Specimen Description TRACHEAL ASPIRATE  Final   Special Requests NONE  Final   Gram Stain   Final    ABUNDANT WBC PRESENT, PREDOMINANTLY PMN NO ORGANISMS SEEN    Culture   Final    NO GROWTH 2 DAYS Performed at [facility], [address] [place], [place] [address]    Report Status 11/24/2019 FINAL  Final  Culture, respiratory (non-expectorated)     Status: None   Collection Time: 12/04/19  2:22 AM   Specimen: Tracheal Aspirate; Respiratory  Result Value Ref  Range Status   Specimen Description TRACHEAL ASPIRATE  Final   Special Requests NONE  Final   Gram Stain   Final    ABUNDANT WBC PRESENT, PREDOMINANTLY PMN ABUNDANT GRAM NEGATIVE RODS Performed at [facility], [address], [place], [place] [address]    Culture   Final    MODERATE PSEUDOMONAS AERUGINOSA FEW ESCHERICHIA COLI Confirmed Extended Spectrum Beta-Lactamase Producer (ESBL).  In bloodstream infections from ESBL organisms, carbapenems are preferred over piperacillin/tazobactam. They are shown to have a lower risk of mortality.    Report Status 12/09/2019 FINAL  Final   Organism ID, Bacteria PSEUDOMONAS AERUGINOSA  Final   Organism ID, Bacteria ESCHERICHIA COLI  Final      Susceptibility   Escherichia coli - MIC*    AMPICILLIN >=32 RESISTANT Resistant     CEFAZOLIN >=64 RESISTANT Resistant     CEFEPIME 2 SENSITIVE Sensitive     CEFTAZIDIME RESISTANT Resistant     CEFTRIAXONE >=64 RESISTANT Resistant     CIPROFLOXACIN <=0.25 SENSITIVE Sensitive     GENTAMICIN >=16 RESISTANT Resistant     IMIPENEM <=0.25 SENSITIVE Sensitive     TRIMETH/SULFA >=320 RESISTANT Resistant     AMPICILLIN/SULBACTAM 16 INTERMEDIATE Intermediate     PIP/TAZO <=4 SENSITIVE Sensitive     * FEW ESCHERICHIA COLI   Pseudomonas aeruginosa - MIC*    CEFTAZIDIME 16 INTERMEDIATE Intermediate     CIPROFLOXACIN >=4 RESISTANT Resistant     GENTAMICIN 2 SENSITIVE Sensitive     IMIPENEM >=16 RESISTANT Resistant     * MODERATE PSEUDOMONAS AERUGINOSA  Culture, Urine     Status: Abnormal   Collection Time: 12/04/19  6:33 PM   Specimen: Urine, Random  Result Value Ref Range Status   Specimen Description URINE, RANDOM  Final   Special Requests   Final    NONE Performed at [facility], [address] [place], [place] [address]    Culture >=100,000 COLONIES/mL PSEUDOMONAS AERUGINOSA (A)  Final   Report Status 12/06/2019 FINAL  Final   Organism ID, Bacteria PSEUDOMONAS AERUGINOSA (A)  Final       Susceptibility   Pseudomonas aeruginosa - MIC*    CEFTAZIDIME 4 SENSITIVE Sensitive     CIPROFLOXACIN >=4 RESISTANT Resistant     GENTAMICIN 4 SENSITIVE Sensitive     IMIPENEM 2 SENSITIVE Sensitive     PIP/TAZO 16 SENSITIVE Sensitive     * >=100,000 COLONIES/mL PSEUDOMONAS AERUGINOSA  Culture, blood (routine x 2)     Status: Abnormal   Collection Time: 12/11/19  9:39 AM  Specimen: BLOOD LEFT HAND  Result Value Ref Range Status   Specimen Description BLOOD LEFT HAND  Final   Special Requests   Final    BOTTLES DRAWN AEROBIC AND ANAEROBIC Blood Culture adequate volume   Culture  Setup Time   Final    GRAM POSITIVE COCCI IN CLUSTERS ANAEROBIC BOTTLE ONLY CRITICAL RESULT CALLED TO, READ BACK BY AND VERIFIED WITH: RN R [place] [id number] AT 41 BY CM    Culture (A)  Final    STAPHYLOCOCCUS SPECIES (COAGULASE NEGATIVE) THE SIGNIFICANCE OF ISOLATING THIS ORGANISM FROM A SINGLE SET OF BLOOD CULTURES WHEN MULTIPLE SETS ARE DRAWN IS UNCERTAIN. PLEASE NOTIFY THE MICROBIOLOGY DEPARTMENT WITHIN ONE WEEK IF SPECIATION AND SENSITIVITIES ARE REQUIRED. Performed at [facility], [place] [address], [place], [place] [address]    Report Status 12/14/2019 FINAL  Final  Culture, blood (routine x 2)     Status: None   Collection Time: 12/11/19  9:47 AM   Specimen: BLOOD  Result Value Ref Range Status   Specimen Description BLOOD RIGHT ANTECUBITAL  Final   Special Requests   Final    BOTTLES DRAWN AEROBIC AND ANAEROBIC Blood Culture adequate volume   Culture  Setup Time   Final    CORRECTED RESULTS NO ORGANISMS SEEN PREVIOUSLY REPORTED AS: GRAM POSITIVE COCCI IN CLUSTERS CORRECTED RESULTS CALLED TO: RN R [place] [id number] AT 920 AM BY CM    Culture   Final    NO GROWTH 5 DAYS Performed at [facility], [place] [address] [place], [place] [address]    Report Status 12/16/2019 FINAL  Final  Culture, respiratory (non-expectorated)     Status: None   Collection Time: 12/11/19 10:27 AM    Specimen: Tracheal Aspirate; Respiratory  Result Value Ref Range Status   Specimen Description TRACHEAL ASPIRATE  Final   Special Requests NONE  Final   Gram Stain NO WBC SEEN NO ORGANISMS SEEN   Final   Culture   Final    FEW PSEUDOMONAS AERUGINOSA FEW STENOTROPHOMONAS MALTOPHILIA SEE SEPARATE REPORT IN [facility] FOR DOS 12/11/19. Performed at [facility], [place] [address], [place], [place] [address]    Report Status 12/26/2019 FINAL  Final   Organism ID, Bacteria PSEUDOMONAS AERUGINOSA  Final   Organism ID, Bacteria STENOTROPHOMONAS MALTOPHILIA  Final      Susceptibility   Pseudomonas aeruginosa - MIC*    CEFTAZIDIME 16 INTERMEDIATE Intermediate     CIPROFLOXACIN >=4 RESISTANT Resistant     GENTAMICIN 2 SENSITIVE Sensitive     IMIPENEM >=16 RESISTANT Resistant     * FEW PSEUDOMONAS AERUGINOSA   Stenotrophomonas maltophilia - MIC*    LEVOFLOXACIN 0.5 SENSITIVE Sensitive     TRIMETH/SULFA <=20 SENSITIVE Sensitive     * FEW STENOTROPHOMONAS MALTOPHILIA  Culture, Urine     Status: Abnormal   Collection Time: 12/11/19 11:30 AM   Specimen: Urine, Random  Result Value Ref Range Status   Specimen Description URINE, RANDOM  Final   Special Requests NONE  Final   Culture (A)  Final    <10,000 COLONIES/mL INSIGNIFICANT GROWTH Performed at [facility], [place] [address] [place], [place] [address]    Report Status 12/12/2019 FINAL  Final  Culture, blood (routine x 2)     Status: None   Collection Time: 12/15/19 12:37 PM   Specimen: BLOOD LEFT HAND  Result Value Ref Range Status   Specimen Description BLOOD LEFT HAND  Final   Special Requests  Final    BOTTLES DRAWN AEROBIC AND ANAEROBIC Blood Culture adequate volume   Culture   Final    NO GROWTH 5 DAYS Performed at [facility], [place] [address] [place], [place] [address]    Report Status 12/20/2019 FINAL  Final  Culture, blood (routine x 2)     Status: None   Collection Time: 12/15/19 12:42 PM   Specimen: BLOOD  LEFT HAND  Result Value Ref Range Status   Specimen Description BLOOD LEFT HAND  Final   Special Requests   Final    BOTTLES DRAWN AEROBIC AND ANAEROBIC Blood Culture adequate volume   Culture   Final    NO GROWTH 5 DAYS Performed at [facility], [place] [address], [place], [place] [address]    Report Status 12/20/2019 FINAL  Final  C Difficile Quick Screen w PCR reflex     Status: None   Collection Time: 12/17/19  7:00 PM  Result Value Ref Range Status   C Diff antigen NEGATIVE NEGATIVE Final   C Diff toxin NEGATIVE NEGATIVE Final   C Diff interpretation No C. difficile detected.  Final    Comment: Performed at [facility], [place] [address], [place], [place] [address]  SARS CORONAVIRUS 2 (TAT 6-24 HRS) Nasopharyngeal Nasopharyngeal Swab     Status: None   Collection Time: 12/20/19 11:45 AM   Specimen: Nasopharyngeal Swab  Result Value Ref Range Status   SARS Coronavirus 2 NEGATIVE NEGATIVE Final    Comment: (NOTE) SARS-CoV-2 target nucleic acids are NOT DETECTED. The SARS-CoV-2 RNA is generally detectable in upper and lower respiratory specimens during the acute phase of infection. Negative results do not preclude SARS-CoV-2 infection, do not rule out co-infections with other pathogens, and should not be used as the sole basis for treatment or other patient management decisions. Negative results must be combined with clinical observations, patient history, and epidemiological information. The expected result is Negative. Fact Sheet for Patients: SugarRoll.be Fact Sheet for Healthcare Providers: https://www.woods-mathews.com/ This test is not yet approved or cleared by the [place] FDA and  has been authorized for detection and/or diagnosis of SARS-CoV-2 by FDA under an Emergency Use Authorization (EUA). This EUA will remain  in effect (meaning this test can be used) for the duration of the COVID-19 declaration under  Section 56 4(b)(1) of the Act, 21 U.S.C. section 360bbb-3(b)(1), unless the authorization is terminated or revoked sooner. Performed at [facility], [place] [address], [place], [place] [address]   Body fluid culture     Status: None   Collection Time: 12/21/19 12:10 PM   Specimen: Lung, Left; Pleural Fluid  Result Value Ref Range Status   Specimen Description PLEURAL FLUID  Final   Special Requests LEFT LUNG THORA  Final   Gram Stain   Final    RARE WBC PRESENT, PREDOMINANTLY MONONUCLEAR NO ORGANISMS SEEN    Culture   Final    NO GROWTH 3 DAYS Performed at [facility], [place] [address] [place], [name] [address]    Report Status 12/24/2019 FINAL  Final  Culture, blood (routine x 2)     Status: None   Collection Time: 12/25/19  2:16 PM   Specimen: BLOOD RIGHT HAND  Result Value Ref Range Status   Specimen Description BLOOD RIGHT HAND  Final   Special Requests   Final    BOTTLES DRAWN AEROBIC ONLY Blood Culture adequate volume   Culture   Final  NO GROWTH 5 DAYS Performed at [facility], [place] [address] [place], [place] [address]    Report Status 01/01/2020 FINAL  Final  Culture, blood (routine x 2)     Status: None   Collection Time: 12/25/19  2:18 PM   Specimen: BLOOD LEFT HAND  Result Value Ref Range Status   Specimen Description BLOOD LEFT HAND  Final   Special Requests   Final    BOTTLES DRAWN AEROBIC ONLY Blood Culture adequate volume   Culture   Final    NO GROWTH 5 DAYS Performed at [facility], [place] [address] [place], [place] [address]    Report Status 01/01/2020 FINAL  Final  Culture, respiratory (non-expectorated)     Status: None   Collection Time: 12/25/19  4:20 PM   Specimen: Tracheal Aspirate; Respiratory  Result Value Ref Range Status   Specimen Description TRACHEAL ASPIRATE  Final   Special Requests NONE  Final   Gram Stain   Final    ABUNDANT WBC PRESENT, PREDOMINANTLY PMN NO ORGANISMS SEEN    Culture   Final    RARE  Consistent with normal respiratory flora. Performed at [facility], [place] [address] [place], [place] [address]    Report Status 12/28/2019 FINAL  Final  Culture, respiratory (non-expectorated)     Status: None   Collection Time: 01/12/20  4:14 PM   Specimen: Tracheal Aspirate; Respiratory  Result Value Ref Range Status   Specimen Description TRACHEAL ASPIRATE  Final   Special Requests NONE  Final   Gram Stain   Final    ABUNDANT WBC PRESENT, PREDOMINANTLY PMN ABUNDANT GRAM NEGATIVE RODS RARE GRAM POSITIVE RODS Performed at [facility], [place] [address] [place], [place] [address]    Culture   Final    ABUNDANT PSEUDOMONAS AERUGINOSA FEW STENOTROPHOMONAS MALTOPHILIA    Report Status 01/16/2020 FINAL  Final   Organism ID, Bacteria PSEUDOMONAS AERUGINOSA  Final   Organism ID, Bacteria STENOTROPHOMONAS MALTOPHILIA  Final      Susceptibility   Pseudomonas aeruginosa - MIC*    CEFTAZIDIME 16 INTERMEDIATE Intermediate     CIPROFLOXACIN >=4 RESISTANT Resistant     GENTAMICIN 2 SENSITIVE Sensitive     IMIPENEM 2 SENSITIVE Sensitive     * ABUNDANT PSEUDOMONAS AERUGINOSA   Stenotrophomonas maltophilia - MIC*    LEVOFLOXACIN 0.5 SENSITIVE Sensitive     TRIMETH/SULFA <=20 SENSITIVE Sensitive     * FEW STENOTROPHOMONAS MALTOPHILIA  Culture, blood (Routine X 2) w Reflex to ID Panel     Status: None   Collection Time: 01/16/20  3:30 PM   Specimen: BLOOD LEFT HAND  Result Value Ref Range Status   Specimen Description BLOOD LEFT HAND  Final   Special Requests   Final    BOTTLES DRAWN AEROBIC AND ANAEROBIC Blood Culture adequate volume   Culture   Final    NO GROWTH 5 DAYS Performed at [facility], [address], [place], [place] [address]    Report Status 01/21/2020 FINAL  Final  Culture, blood (Routine X 2) w Reflex to ID Panel     Status: None   Collection Time: 01/16/20  3:32 PM   Specimen: BLOOD LEFT ARM  Result Value Ref Range Status   Specimen Description  BLOOD LEFT ARM  Final   Special Requests   Final    BOTTLES DRAWN AEROBIC AND ANAEROBIC Blood Culture adequate volume   Culture   Final  NO GROWTH 5 DAYS Performed at [facility], [place] [address] [place], [place] [address]    Report Status 01/21/2020 FINAL  Final    Coagulation Studies: No results for input(s): LABPROT, INR in the last 72 hours.  Urinalysis: No results for input(s): COLORURINE, LABSPEC, PHURINE, GLUCOSEU, HGBUR, BILIRUBINUR, KETONESUR, PROTEINUR, UROBILINOGEN, NITRITE, LEUKOCYTESUR in the last 72 hours.  Invalid input(s): APPERANCEUR    Imaging: No results found.   Medications:     heparin sodium (porcine), lidocaine, lidocaine  Assessment/ Plan:  74 y.o. male with a PMHx of acute respiratory failure status post COVID-19 infection status post tracheostomy placement, atrial fibrillation, hypertension, CVA, gout, hypertrophic cardiomyopathy, who was admitted to [facility] on 11/21/2019 for ongoing treatment of acute respiratory failure.  1.  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  Patient completed dialysis treatment today.  Tolerated well.  Next Alysis treatment on Friday.  2.  Acute respiratory failure secondary to COVID-19 infection and its sequela.  -Patient has progressed slowly though is now off of the ventilator and breathing through air/trach collar..  3.  Hyperkalemia.  Resolved at this time.  4.  Hyponatremia.  Improved significantly with ongoing dialysis treatments.  Serum sodium 138.  5.  Anemia of chronic kidney disease.  Hemoglobin has dropped down to 6.6.  Blood transfusion has been ordered.       LOS: 0 Shane Banks 5/5/20215:23 PM

## 2020-01-31 NOTE — Progress Notes (Addendum)
Pulmonary Critical Care Medicine [place]   PULMONARY CRITICAL CARE SERVICE  PROGRESS NOTE  Date of Service: 01/31/2020  Shane Banks  VWU:[id number]  DOB: 09-09-1946   DOA: 11/21/2019  Referring Physician: [name], MD  HPI: Shane Banks is a 74 y.o. male seen for follow up of Acute on Chronic Respiratory Failure.  Patient remains on NAG 3 L using PMV with no difficulty satting well with no distress.  Medications: Reviewed on Rounds  Physical Exam:  Vitals: Pulse 67 respirations 24 BP 124/63 O2 sat 100% temp 90.0  Ventilator Settings NAG 3 L  . General: Comfortable at this time . Eyes: Grossly normal lids, irises & conjunctiva . ENT: grossly tongue is normal . Neck: no obvious mass . Cardiovascular: S1 S2 normal no gallop . Respiratory: No rales or rhonchi noted . Abdomen: soft . Skin: no rash seen on limited exam . Musculoskeletal: not rigid . Psychiatric:unable to assess . Neurologic: no seizure no involuntary movements         Lab Data:   Basic Metabolic Panel: Recent Labs  Lab 01/26/20 0505 01/29/20 0700 01/30/20 0501 01/31/20 0500  NA 133* 138  --  138  K 3.7 3.5  --  3.4*  CL 97* 99  --  96*  CO2 30 29  --  32  GLUCOSE 163* 121*  --  121*  BUN 100* 125*  --  94*  CREATININE 1.70* 1.99*  --  1.59*  CALCIUM 10.0 10.3  --  9.9  MG 1.8 1.7 1.9 1.9  PHOS 2.9 3.7  --  2.1*    ABG: No results for input(s): PHART, PCO2ART, PO2ART, HCO3, O2SAT in the last 168 hours.  Liver Function Tests: Recent Labs  Lab 01/26/20 0505 01/29/20 0700 01/31/20 0500  ALBUMIN 2.7* 2.6* 2.6*   No results for input(s): LIPASE, AMYLASE in the last 168 hours. No results for input(s): AMMONIA in the last 168 hours.  CBC: Recent Labs  Lab 01/26/20 0505 01/29/20 0700 01/31/20 0500  WBC 19.1* 14.6* 13.9*  HGB 7.4* 7.1* 6.6*  HCT 24.4* 23.1* 21.4*  MCV 86.8 85.6 85.9  PLT 223 236 231    Cardiac Enzymes: No results for input(s): CKTOTAL,  CKMB, CKMBINDEX, TROPONINI in the last 168 hours.  BNP (last 3 results) No results for input(s): BNP in the last 8760 hours.  ProBNP (last 3 results) No results for input(s): PROBNP in the last 8760 hours.  Radiological Exams: No results found.  Assessment/Plan Active Problems:   Acute on chronic respiratory failure with hypoxia (HCC)   COVID-19 virus infection   Atrial fibrillation with RVR (HCC)   Severe sepsis (HCC)   Pneumonia due to COVID-19 virus   1. Acute on chronic respiratory failure hypoxia patient remains on NAG currently on 3 L oxygen.  Will continue aggressive pulmonary toilet supportive measures. 2. COVID-19 virus infection resolved we will continue to follow 3. Atrial fibrillation rate is controlled 4. Severe sepsis resolved hemodynamics are stable 5. Pneumonia due to COVID-19 clinically improved    I have personally seen and evaluated the patient, evaluated laboratory and imaging results, formulated the assessment and plan and placed orders. The Patient requires high complexity decision making with multiple systems involvement.  Rounds were done with the Respiratory Therapy Director and Staff therapists and discussed with nursing staff also.  Allyne Gee, MD [facility] Pulmonary Critical Care Medicine Sleep Medicine

## 2020-02-01 DIAGNOSIS — N179 Acute kidney failure, unspecified: Secondary | ICD-10-CM | POA: Diagnosis not present

## 2020-02-01 DIAGNOSIS — J9621 Acute and chronic respiratory failure with hypoxia: Secondary | ICD-10-CM | POA: Diagnosis not present

## 2020-02-01 DIAGNOSIS — I4891 Unspecified atrial fibrillation: Secondary | ICD-10-CM | POA: Diagnosis not present

## 2020-02-01 DIAGNOSIS — U071 COVID-19: Secondary | ICD-10-CM | POA: Diagnosis not present

## 2020-02-01 LAB — CBC
HCT: 27 % — ABNORMAL LOW (ref 39.0–52.0)
Hemoglobin: 8.5 g/dL — ABNORMAL LOW (ref 13.0–17.0)
MCH: 27.2 pg (ref 26.0–34.0)
MCHC: 31.5 g/dL (ref 30.0–36.0)
MCV: 86.5 fL (ref 80.0–100.0)
Platelets: 232 10*3/uL (ref 150–400)
RBC: 3.12 MIL/uL — ABNORMAL LOW (ref 4.22–5.81)
RDW: 17.2 % — ABNORMAL HIGH (ref 11.5–15.5)
WBC: 14.1 10*3/uL — ABNORMAL HIGH (ref 4.0–10.5)
nRBC: 0 % (ref 0.0–0.2)

## 2020-02-01 LAB — BPAM RBC
Blood Product Expiration Date: 202105212359
ISSUE DATE / TIME: 202105051250
Unit Type and Rh: 5100

## 2020-02-01 LAB — TYPE AND SCREEN
ABO/RH(D): O POS
Antibody Screen: NEGATIVE
Unit division: 0

## 2020-02-01 LAB — POTASSIUM: Potassium: 3.5 mmol/L (ref 3.5–5.1)

## 2020-02-01 NOTE — Progress Notes (Signed)
Pulmonary Critical Care Medicine [place]   PULMONARY CRITICAL CARE SERVICE  PROGRESS NOTE  Date of Service: 02/01/2020  Shane Banks  [id number]:[id number]  DOB: Nov 16, 1945   DOA: 11/21/2019  Referring Physician: [name], MD  HPI: Shane Banks is a 74 y.o. male seen for follow up of Acute on Chronic Respiratory Failure.  Patient currently is on T collar has been on 35% FiO2 has good saturations noted  Medications: Reviewed on Rounds  Physical Exam:  Vitals: Temperature 97.5 pulse 70 respiratory is 40 blood pressure is 144/71 saturations 92%  Ventilator Settings off the ventilator on T collar with an FiO2 of 35%  . General: Comfortable at this time . Eyes: Grossly normal lids, irises & conjunctiva . ENT: grossly tongue is normal . Neck: no obvious mass . Cardiovascular: S1 S2 normal no gallop . Respiratory: No rhonchi coarse breath sounds . Abdomen: soft . Skin: no rash seen on limited exam . Musculoskeletal: not rigid . Psychiatric:unable to assess . Neurologic: no seizure no involuntary movements         Lab Data:   Basic Metabolic Panel: Recent Labs  Lab 01/26/20 0505 01/29/20 0700 01/30/20 0501 01/31/20 0500 02/01/20 0637  NA 133* 138  --  138  --   K 3.7 3.5  --  3.4* 3.5  CL 97* 99  --  96*  --   CO2 30 29  --  32  --   GLUCOSE 163* 121*  --  121*  --   BUN 100* 125*  --  94*  --   CREATININE 1.70* 1.99*  --  1.59*  --   CALCIUM 10.0 10.3  --  9.9  --   MG 1.8 1.7 1.9 1.9  --   PHOS 2.9 3.7  --  2.1*  --     ABG: No results for input(s): PHART, PCO2ART, PO2ART, HCO3, O2SAT in the last 168 hours.  Liver Function Tests: Recent Labs  Lab 01/26/20 0505 01/29/20 0700 01/31/20 0500  ALBUMIN 2.7* 2.6* 2.6*   No results for input(s): LIPASE, AMYLASE in the last 168 hours. No results for input(s): AMMONIA in the last 168 hours.  CBC: Recent Labs  Lab 01/26/20 0505 01/29/20 0700 01/31/20 0500 02/01/20 0637  WBC 19.1* 14.6*  13.9* 14.1*  HGB 7.4* 7.1* 6.6* 8.5*  HCT 24.4* 23.1* 21.4* 27.0*  MCV 86.8 85.6 85.9 86.5  PLT 223 236 231 232    Cardiac Enzymes: No results for input(s): CKTOTAL, CKMB, CKMBINDEX, TROPONINI in the last 168 hours.  BNP (last 3 results) No results for input(s): BNP in the last 8760 hours.  ProBNP (last 3 results) No results for input(s): PROBNP in the last 8760 hours.  Radiological Exams: No results found.  Assessment/Plan Active Problems:   Acute on chronic respiratory failure with hypoxia (HCC)   COVID-19 virus infection   Atrial fibrillation with RVR (HCC)   Severe sepsis (HCC)   Pneumonia due to COVID-19 virus   1. Acute on chronic respiratory failure hypoxia continue with T collar trials as tolerated continue with supportive care patient right now is on 35% FiO2 we will try to wean FiO2 down 2. COVID-19 virus infection treated resolved 3. Atrial fibrillation rate controlled 4. Severe sepsis resolved 5. Pneumonia due to COVID-19 resolving we will continue present management supportive care   I have personally seen and evaluated the patient, evaluated laboratory and imaging results, formulated the assessment and plan and placed orders. The Patient requires high complexity  decision making with multiple systems involvement.  Rounds were done with the Respiratory Therapy Director and Staff therapists and discussed with nursing staff also.  Allyne Gee, MD [facility] Pulmonary Critical Care Medicine Sleep Medicine

## 2020-02-02 ENCOUNTER — Institutional Professional Consult (permissible substitution) (HOSPITAL_COMMUNITY): Payer: Medicare HMO

## 2020-02-02 DIAGNOSIS — J9621 Acute and chronic respiratory failure with hypoxia: Secondary | ICD-10-CM | POA: Diagnosis not present

## 2020-02-02 DIAGNOSIS — N179 Acute kidney failure, unspecified: Secondary | ICD-10-CM | POA: Diagnosis not present

## 2020-02-02 DIAGNOSIS — U071 COVID-19: Secondary | ICD-10-CM | POA: Diagnosis not present

## 2020-02-02 DIAGNOSIS — I4891 Unspecified atrial fibrillation: Secondary | ICD-10-CM | POA: Diagnosis not present

## 2020-02-02 LAB — CBC
HCT: 27.8 % — ABNORMAL LOW (ref 39.0–52.0)
Hemoglobin: 8.8 g/dL — ABNORMAL LOW (ref 13.0–17.0)
MCH: 27 pg (ref 26.0–34.0)
MCHC: 31.7 g/dL (ref 30.0–36.0)
MCV: 85.3 fL (ref 80.0–100.0)
Platelets: 259 10*3/uL (ref 150–400)
RBC: 3.26 MIL/uL — ABNORMAL LOW (ref 4.22–5.81)
RDW: 17.3 % — ABNORMAL HIGH (ref 11.5–15.5)
WBC: 13.6 10*3/uL — ABNORMAL HIGH (ref 4.0–10.5)
nRBC: 0 % (ref 0.0–0.2)

## 2020-02-02 LAB — RENAL FUNCTION PANEL
Albumin: 2.9 g/dL — ABNORMAL LOW (ref 3.5–5.0)
Anion gap: 8 (ref 5–15)
BUN: 73 mg/dL — ABNORMAL HIGH (ref 8–23)
CO2: 29 mmol/L (ref 22–32)
Calcium: 9.8 mg/dL (ref 8.9–10.3)
Chloride: 102 mmol/L (ref 98–111)
Creatinine, Ser: 1.58 mg/dL — ABNORMAL HIGH (ref 0.61–1.24)
GFR calc Af Amer: 50 mL/min — ABNORMAL LOW (ref >60)
GFR calc non Af Amer: 43 mL/min — ABNORMAL LOW (ref >60)
Glucose, Bld: 124 mg/dL — ABNORMAL HIGH (ref 70–99)
Phosphorus: 1.5 mg/dL — ABNORMAL LOW (ref 2.5–4.6)
Potassium: 3.6 mmol/L (ref 3.5–5.1)
Sodium: 139 mmol/L (ref 135–145)

## 2020-02-02 NOTE — Progress Notes (Addendum)
Pulmonary Critical Care Medicine [place]   PULMONARY CRITICAL CARE SERVICE  PROGRESS NOTE  Date of Service: 02/02/2020  Shane Banks  YYT:[id number]  DOB: 05/19/1946   DOA: 11/21/2019  Referring Physician: [name], MD  HPI: Shane Banks is a 74 y.o. male seen for follow up of Acute on Chronic Respiratory Failure.  Patient remains on 35% T-bar at this time resting comfortably.  No fever or distress.  Medications: Reviewed on Rounds  Physical Exam:  Vitals: Pulse 77 respirations 37 BP 149/72 O2 sat 99% temp 98.3  Ventilator Settings 35% T-bar  . General: Comfortable at this time . Eyes: Grossly normal lids, irises & conjunctiva . ENT: grossly tongue is normal . Neck: no obvious mass . Cardiovascular: S1 S2 normal no gallop . Respiratory: No rales or rhonchi noted . Abdomen: soft . Skin: no rash seen on limited exam . Musculoskeletal: not rigid . Psychiatric:unable to assess . Neurologic: no seizure no involuntary movements         Lab Data:   Basic Metabolic Panel: Recent Labs  Lab 01/29/20 0700 01/30/20 0501 01/31/20 0500 02/01/20 0637 02/02/20 0553  NA 138  --  138  --  139  K 3.5  --  3.4* 3.5 3.6  CL 99  --  96*  --  102  CO2 29  --  32  --  29  GLUCOSE 121*  --  121*  --  124*  BUN 125*  --  94*  --  73*  CREATININE 1.99*  --  1.59*  --  1.58*  CALCIUM 10.3  --  9.9  --  9.8  MG 1.7 1.9 1.9  --   --   PHOS 3.7  --  2.1*  --  1.5*    ABG: No results for input(s): PHART, PCO2ART, PO2ART, HCO3, O2SAT in the last 168 hours.  Liver Function Tests: Recent Labs  Lab 01/29/20 0700 01/31/20 0500 02/02/20 0553  ALBUMIN 2.6* 2.6* 2.9*   No results for input(s): LIPASE, AMYLASE in the last 168 hours. No results for input(s): AMMONIA in the last 168 hours.  CBC: Recent Labs  Lab 01/29/20 0700 01/31/20 0500 02/01/20 0637 02/02/20 0553  WBC 14.6* 13.9* 14.1* 13.6*  HGB 7.1* 6.6* 8.5* 8.8*  HCT 23.1* 21.4* 27.0* 27.8*  MCV  85.6 85.9 86.5 85.3  PLT 236 231 232 259    Cardiac Enzymes: No results for input(s): CKTOTAL, CKMB, CKMBINDEX, TROPONINI in the last 168 hours.  BNP (last 3 results) No results for input(s): BNP in the last 8760 hours.  ProBNP (last 3 results) No results for input(s): PROBNP in the last 8760 hours.  Radiological Exams: DG CHEST PORT 1 VIEW  Result Date: 02/02/2020 CLINICAL DATA:  Pneumonia. EXAM: PORTABLE CHEST 1 VIEW COMPARISON:  01/22/2020. FINDINGS: Tracheostomy tube and right central line in unchanged position. Heart size unchanged. Diffuse bilateral pulmonary infiltrates/edema again noted. Bibasilar atelectasis again noted. Bilateral pleural effusions appear unchanged. No pneumothorax. Chest is unchanged from prior exam. IMPRESSION: 1.  Tracheostomy tube and right central line in unchanged position. 2. Diffuse bilateral pulmonary infiltrates/edema, bibasilar atelectasis, bilateral pleural effusions again noted. Chest is unchanged from prior exam. Electronically Signed   By: Marcello Moores  Register   On: 02/02/2020 07:26    Assessment/Plan Active Problems:   Acute on chronic respiratory failure with hypoxia ([place])   COVID-19 virus infection   Atrial fibrillation with RVR (HCC)   Severe sepsis ([place])   Pneumonia due to COVID-19 virus  1. Acute on chronic respiratory failure hypoxia patient continue with T-bar 35% FiO2 at this time.  We will continue to attempt weaning FiO2 as tolerated.  Continue supportive measures and aggressive pulmonary toilet. 2. COVID-19 virus infection treated resolved 3. Atrial fibrillation rate controlled 4. Severe sepsis resolved 5. Pneumonia due to COVID-19 resolving we will continue present management supportive care   I have personally seen and evaluated the patient, evaluated laboratory and imaging results, formulated the assessment and plan and placed orders. The Patient requires high complexity decision making with multiple systems involvement.  Rounds  were done with the Respiratory Therapy Director and Staff therapists and discussed with nursing staff also.  Allyne Gee, MD [facility] Pulmonary Critical Care Medicine Sleep Medicine

## 2020-02-02 NOTE — Progress Notes (Signed)
Let me know [facility]  ROUNDING NOTE   Subjective:  Patient due for hemodialysis today. Still off of the ventilator at the moment. Noted be tachypneic but breathing comfortably otherwise.  Objective:  Vital signs in last 24 hours:  Temperature 98.3 pulse 77 respirations 37 blood pressure 149/72  Physical Exam: General: Critically ill-appearing  Head: Normocephalic, atraumatic. Moist oral mucosal membranes  Eyes: Anicteric  Neck: Tracheostomy in place  Lungs:  Scattered rhonchi, normal effort  Heart: S1S2 no rubs  Abdomen:  Soft, nontender, bowel sounds present, PEG tube in place  Extremities: 2+ peripheral edema  Neurologic: Awake, alert  Skin: No rash  Access: Right IJ temporary dialysis catheter    Basic Metabolic Panel: Recent Labs  Lab 01/29/20 0700 01/30/20 0501 01/31/20 0500 02/01/20 0637 02/02/20 0553  NA 138  --  138  --  139  K 3.5  --  3.4* 3.5 3.6  CL 99  --  96*  --  102  CO2 29  --  32  --  29  GLUCOSE 121*  --  121*  --  124*  BUN 125*  --  94*  --  73*  CREATININE 1.99*  --  1.59*  --  1.58*  CALCIUM 10.3  --  9.9  --  9.8  MG 1.7 1.9 1.9  --   --   PHOS 3.7  --  2.1*  --  1.5*    Liver Function Tests: Recent Labs  Lab 01/29/20 0700 01/31/20 0500 02/02/20 0553  ALBUMIN 2.6* 2.6* 2.9*   No results for input(s): LIPASE, AMYLASE in the last 168 hours. No results for input(s): AMMONIA in the last 168 hours.  CBC: Recent Labs  Lab 01/29/20 0700 01/31/20 0500 02/01/20 0637 02/02/20 0553  WBC 14.6* 13.9* 14.1* 13.6*  HGB 7.1* 6.6* 8.5* 8.8*  HCT 23.1* 21.4* 27.0* 27.8*  MCV 85.6 85.9 86.5 85.3  PLT 236 231 232 259    Cardiac Enzymes: No results for input(s): CKTOTAL, CKMB, CKMBINDEX, TROPONINI in the last 168 hours.  BNP: Invalid input(s): POCBNP  CBG: No results for input(s): GLUCAP in the last 168 hours.  Microbiology: Results for orders placed or performed during the hospital encounter of 11/21/19  Culture,  Urine     Status: Abnormal   Collection Time: 11/22/19  1:25 PM   Specimen: Urine, Random  Result Value Ref Range Status   Specimen Description URINE, RANDOM  Final   Special Requests   Final    NONE Performed at [facility], [address], [place], [place] [address]    Culture 50,000 COLONIES/mL YEAST (A)  Final   Report Status 11/23/2019 FINAL  Final  Culture, blood (routine x 2)     Status: None   Collection Time: 11/22/19  1:40 PM   Specimen: BLOOD LEFT HAND  Result Value Ref Range Status   Specimen Description BLOOD LEFT HAND  Final   Special Requests   Final    BOTTLES DRAWN AEROBIC ONLY Blood Culture results may not be optimal due to an inadequate volume of blood received in culture bottles   Culture   Final    NO GROWTH 5 DAYS Performed at [facility], [place] [address], [place],  [address]    Report Status 11/27/2019 FINAL  Final  Culture, blood (routine x 2)     Status: None   Collection Time: 11/22/19  1:44 PM   Specimen: BLOOD LEFT HAND  Result Value Ref  Range Status   Specimen Description BLOOD LEFT HAND  Final   Special Requests   Final    BOTTLES DRAWN AEROBIC ONLY Blood Culture results may not be optimal due to an inadequate volume of blood received in culture bottles   Culture   Final    NO GROWTH 5 DAYS Performed at [facility], [place] [address], [place], [place] [address]    Report Status 11/27/2019 FINAL  Final  Culture, respiratory (non-expectorated)     Status: None   Collection Time: 11/22/19  4:59 PM   Specimen: Tracheal Aspirate; Respiratory  Result Value Ref Range Status   Specimen Description TRACHEAL ASPIRATE  Final   Special Requests NONE  Final   Gram Stain   Final    ABUNDANT WBC PRESENT, PREDOMINANTLY PMN NO ORGANISMS SEEN    Culture   Final    NO GROWTH 2 DAYS Performed at [facility], [address], [place], [place] [address]    Report Status 11/24/2019 FINAL  Final  Culture, respiratory  (non-expectorated)     Status: None   Collection Time: 12/04/19  2:22 AM   Specimen: Tracheal Aspirate; Respiratory  Result Value Ref Range Status   Specimen Description TRACHEAL ASPIRATE  Final   Special Requests NONE  Final   Gram Stain   Final    ABUNDANT WBC PRESENT, PREDOMINANTLY PMN ABUNDANT GRAM NEGATIVE RODS Performed at [facility], [address], [place], [place] [address]    Culture   Final    MODERATE PSEUDOMONAS AERUGINOSA FEW ESCHERICHIA COLI Confirmed Extended Spectrum Beta-Lactamase Producer (ESBL).  In bloodstream infections from ESBL organisms, carbapenems are preferred over piperacillin/tazobactam. They are shown to have a lower risk of mortality.    Report Status 12/09/2019 FINAL  Final   Organism ID, Bacteria PSEUDOMONAS AERUGINOSA  Final   Organism ID, Bacteria ESCHERICHIA COLI  Final      Susceptibility   Escherichia coli - MIC*    AMPICILLIN >=32 RESISTANT Resistant     CEFAZOLIN >=64 RESISTANT Resistant     CEFEPIME 2 SENSITIVE Sensitive     CEFTAZIDIME RESISTANT Resistant     CEFTRIAXONE >=64 RESISTANT Resistant     CIPROFLOXACIN <=0.25 SENSITIVE Sensitive     GENTAMICIN >=16 RESISTANT Resistant     IMIPENEM <=0.25 SENSITIVE Sensitive     TRIMETH/SULFA >=320 RESISTANT Resistant     AMPICILLIN/SULBACTAM 16 INTERMEDIATE Intermediate     PIP/TAZO <=4 SENSITIVE Sensitive     * FEW ESCHERICHIA COLI   Pseudomonas aeruginosa - MIC*    CEFTAZIDIME 16 INTERMEDIATE Intermediate     CIPROFLOXACIN >=4 RESISTANT Resistant     GENTAMICIN 2 SENSITIVE Sensitive     IMIPENEM >=16 RESISTANT Resistant     * MODERATE PSEUDOMONAS AERUGINOSA  Culture, Urine     Status: Abnormal   Collection Time: 12/04/19  6:33 PM   Specimen: Urine, Random  Result Value Ref Range Status   Specimen Description URINE, RANDOM  Final   Special Requests   Final    NONE Performed at [facility], [address], [place], [place] [address]    Culture >=100,000 COLONIES/mL  PSEUDOMONAS AERUGINOSA (A)  Final   Report Status 12/06/2019 FINAL  Final   Organism ID, Bacteria PSEUDOMONAS AERUGINOSA (A)  Final      Susceptibility   Pseudomonas aeruginosa - MIC*    CEFTAZIDIME 4 SENSITIVE Sensitive     CIPROFLOXACIN >=4 RESISTANT Resistant     GENTAMICIN 4  SENSITIVE Sensitive     IMIPENEM 2 SENSITIVE Sensitive     PIP/TAZO 16 SENSITIVE Sensitive     * >=100,000 COLONIES/mL PSEUDOMONAS AERUGINOSA  Culture, blood (routine x 2)     Status: Abnormal   Collection Time: 12/11/19  9:39 AM   Specimen: BLOOD LEFT HAND  Result Value Ref Range Status   Specimen Description BLOOD LEFT HAND  Final   Special Requests   Final    BOTTLES DRAWN AEROBIC AND ANAEROBIC Blood Culture adequate volume   Culture  Setup Time   Final    GRAM POSITIVE COCCI IN CLUSTERS ANAEROBIC BOTTLE ONLY CRITICAL RESULT CALLED TO, READ BACK BY AND VERIFIED WITH: RN R [place] [id number] AT 46 BY CM    Culture (A)  Final    STAPHYLOCOCCUS SPECIES (COAGULASE NEGATIVE) THE SIGNIFICANCE OF ISOLATING THIS ORGANISM FROM A SINGLE SET OF BLOOD CULTURES WHEN MULTIPLE SETS ARE DRAWN IS UNCERTAIN. PLEASE NOTIFY THE MICROBIOLOGY DEPARTMENT WITHIN ONE WEEK IF SPECIATION AND SENSITIVITIES ARE REQUIRED. Performed at [facility], [place] [address], [place], [place] [address]    Report Status 12/14/2019 FINAL  Final  Culture, blood (routine x 2)     Status: None   Collection Time: 12/11/19  9:47 AM   Specimen: BLOOD  Result Value Ref Range Status   Specimen Description BLOOD RIGHT ANTECUBITAL  Final   Special Requests   Final    BOTTLES DRAWN AEROBIC AND ANAEROBIC Blood Culture adequate volume   Culture  Setup Time   Final    CORRECTED RESULTS NO ORGANISMS SEEN PREVIOUSLY REPORTED AS: GRAM POSITIVE COCCI IN CLUSTERS CORRECTED RESULTS CALLED TO: RN R [place] [id number] AT 920 AM BY CM    Culture   Final    NO GROWTH 5 DAYS Performed at [facility], [place] [address], [place], [place] [address]     Report Status 12/16/2019 FINAL  Final  Culture, respiratory (non-expectorated)     Status: None   Collection Time: 12/11/19 10:27 AM   Specimen: Tracheal Aspirate; Respiratory  Result Value Ref Range Status   Specimen Description TRACHEAL ASPIRATE  Final   Special Requests NONE  Final   Gram Stain NO WBC SEEN NO ORGANISMS SEEN   Final   Culture   Final    FEW PSEUDOMONAS AERUGINOSA FEW STENOTROPHOMONAS MALTOPHILIA SEE SEPARATE REPORT IN [facility] FOR DOS 12/11/19. Performed at [facility], [place] [address] [place], [place] [address]    Report Status 12/26/2019 FINAL  Final   Organism ID, Bacteria PSEUDOMONAS AERUGINOSA  Final   Organism ID, Bacteria STENOTROPHOMONAS MALTOPHILIA  Final      Susceptibility   Pseudomonas aeruginosa - MIC*    CEFTAZIDIME 16 INTERMEDIATE Intermediate     CIPROFLOXACIN >=4 RESISTANT Resistant     GENTAMICIN 2 SENSITIVE Sensitive     IMIPENEM >=16 RESISTANT Resistant     * FEW PSEUDOMONAS AERUGINOSA   Stenotrophomonas maltophilia - MIC*    LEVOFLOXACIN 0.5 SENSITIVE Sensitive     TRIMETH/SULFA <=20 SENSITIVE Sensitive     * FEW STENOTROPHOMONAS MALTOPHILIA  Culture, Urine     Status: Abnormal   Collection Time: 12/11/19 11:30 AM   Specimen: Urine, Random  Result Value Ref Range Status   Specimen Description URINE, RANDOM  Final   Special Requests NONE  Final   Culture (A)  Final    <10,000 COLONIES/mL INSIGNIFICANT GROWTH Performed at [facility], [place] [address] [place], [place] [address]    Report  Status 12/12/2019 FINAL  Final  Culture, blood (routine x 2)     Status: None   Collection Time: 12/15/19 12:37 PM   Specimen: BLOOD LEFT HAND  Result Value Ref Range Status   Specimen Description BLOOD LEFT HAND  Final   Special Requests   Final    BOTTLES DRAWN AEROBIC AND ANAEROBIC Blood Culture adequate volume   Culture   Final    NO GROWTH 5 DAYS Performed at [facility], [place] [address], [place], [place] [address]    Report  Status 12/20/2019 FINAL  Final  Culture, blood (routine x 2)     Status: None   Collection Time: 12/15/19 12:42 PM   Specimen: BLOOD LEFT HAND  Result Value Ref Range Status   Specimen Description BLOOD LEFT HAND  Final   Special Requests   Final    BOTTLES DRAWN AEROBIC AND ANAEROBIC Blood Culture adequate volume   Culture   Final    NO GROWTH 5 DAYS Performed at [facility], [place] [address] [place], [place] [address]    Report Status 12/20/2019 FINAL  Final  C Difficile Quick Screen w PCR reflex     Status: None   Collection Time: 12/17/19  7:00 PM  Result Value Ref Range Status   C Diff antigen NEGATIVE NEGATIVE Final   C Diff toxin NEGATIVE NEGATIVE Final   C Diff interpretation No C. difficile detected.  Final    Comment: Performed at [facility], [place] [address] [place], [place] [address]  SARS CORONAVIRUS 2 (TAT 6-24 HRS) Nasopharyngeal Nasopharyngeal Swab     Status: None   Collection Time: 12/20/19 11:45 AM   Specimen: Nasopharyngeal Swab  Result Value Ref Range Status   SARS Coronavirus 2 NEGATIVE NEGATIVE Final    Comment: (NOTE) SARS-CoV-2 target nucleic acids are NOT DETECTED. The SARS-CoV-2 RNA is generally detectable in upper and lower respiratory specimens during the acute phase of infection. Negative results do not preclude SARS-CoV-2 infection, do not rule out co-infections with other pathogens, and should not be used as the sole basis for treatment or other patient management decisions. Negative results must be combined with clinical observations, patient history, and epidemiological information. The expected result is Negative. Fact Sheet for Patients: SugarRoll.be Fact Sheet for Healthcare Providers: https://www.woods-mathews.com/ This test is not yet approved or cleared by the [place] FDA and  has been authorized for detection and/or diagnosis of SARS-CoV-2 by FDA under an Emergency Use  Authorization (EUA). This EUA will remain  in effect (meaning this test can be used) for the duration of the COVID-19 declaration under Section 56 4(b)(1) of the Act, 21 U.S.C. section 360bbb-3(b)(1), unless the authorization is terminated or revoked sooner. Performed at [facility], [place] [address] [place], [place] [address]   Body fluid culture     Status: None   Collection Time: 12/21/19 12:10 PM   Specimen: Lung, Left; Pleural Fluid  Result Value Ref Range Status   Specimen Description PLEURAL FLUID  Final   Special Requests LEFT LUNG THORA  Final   Gram Stain   Final    RARE WBC PRESENT, PREDOMINANTLY MONONUCLEAR NO ORGANISMS SEEN    Culture   Final    NO GROWTH 3 DAYS Performed at [facility], [place] [address], [place], [place] [address]    Report Status 12/24/2019 FINAL  Final  Culture, blood (routine x 2)     Status: None   Collection  Time: 12/25/19  2:16 PM   Specimen: BLOOD RIGHT HAND  Result Value Ref Range Status   Specimen Description BLOOD RIGHT HAND  Final   Special Requests   Final    BOTTLES DRAWN AEROBIC ONLY Blood Culture adequate volume   Culture   Final    NO GROWTH 5 DAYS Performed at [facility], [address], [place], [place] [address]    Report Status 01/01/2020 FINAL  Final  Culture, blood (routine x 2)     Status: None   Collection Time: 12/25/19  2:18 PM   Specimen: BLOOD LEFT HAND  Result Value Ref Range Status   Specimen Description BLOOD LEFT HAND  Final   Special Requests   Final    BOTTLES DRAWN AEROBIC ONLY Blood Culture adequate volume   Culture   Final    NO GROWTH 5 DAYS Performed at [facility], [place] [address], [place], [place] [address]    Report Status 01/01/2020 FINAL  Final  Culture, respiratory (non-expectorated)     Status: None   Collection Time: 12/25/19  4:20 PM   Specimen: Tracheal Aspirate; Respiratory  Result Value Ref Range Status   Specimen Description TRACHEAL ASPIRATE  Final   Special  Requests NONE  Final   Gram Stain   Final    ABUNDANT WBC PRESENT, PREDOMINANTLY PMN NO ORGANISMS SEEN    Culture   Final    RARE Consistent with normal respiratory flora. Performed at [facility], [place] [address] [place], [place] [address]    Report Status 12/28/2019 FINAL  Final  Culture, respiratory (non-expectorated)     Status: None   Collection Time: 01/12/20  4:14 PM   Specimen: Tracheal Aspirate; Respiratory  Result Value Ref Range Status   Specimen Description TRACHEAL ASPIRATE  Final   Special Requests NONE  Final   Gram Stain   Final    ABUNDANT WBC PRESENT, PREDOMINANTLY PMN ABUNDANT GRAM NEGATIVE RODS RARE GRAM POSITIVE RODS Performed at [facility], [place] [address] [place], [place] [address]    Culture   Final    ABUNDANT PSEUDOMONAS AERUGINOSA FEW STENOTROPHOMONAS MALTOPHILIA    Report Status 01/16/2020 FINAL  Final   Organism ID, Bacteria PSEUDOMONAS AERUGINOSA  Final   Organism ID, Bacteria STENOTROPHOMONAS MALTOPHILIA  Final      Susceptibility   Pseudomonas aeruginosa - MIC*    CEFTAZIDIME 16 INTERMEDIATE Intermediate     CIPROFLOXACIN >=4 RESISTANT Resistant     GENTAMICIN 2 SENSITIVE Sensitive     IMIPENEM 2 SENSITIVE Sensitive     * ABUNDANT PSEUDOMONAS AERUGINOSA   Stenotrophomonas maltophilia - MIC*    LEVOFLOXACIN 0.5 SENSITIVE Sensitive     TRIMETH/SULFA <=20 SENSITIVE Sensitive     * FEW STENOTROPHOMONAS MALTOPHILIA  Culture, blood (Routine X 2) w Reflex to ID Panel     Status: None   Collection Time: 01/16/20  3:30 PM   Specimen: BLOOD LEFT HAND  Result Value Ref Range Status   Specimen Description BLOOD LEFT HAND  Final   Special Requests   Final    BOTTLES DRAWN AEROBIC AND ANAEROBIC Blood Culture adequate volume   Culture   Final    NO GROWTH 5 DAYS Performed at [facility], [address] [place], [place] [address]    Report Status 01/21/2020 FINAL  Final  Culture, blood (Routine X 2) w Reflex to ID Panel      Status: None   Collection  Time: 01/16/20  3:32 PM   Specimen: BLOOD LEFT ARM  Result Value Ref Range Status   Specimen Description BLOOD LEFT ARM  Final   Special Requests   Final    BOTTLES DRAWN AEROBIC AND ANAEROBIC Blood Culture adequate volume   Culture   Final    NO GROWTH 5 DAYS Performed at [facility], [address] [place], [place] [address]    Report Status 01/21/2020 FINAL  Final    Coagulation Studies: No results for input(s): LABPROT, INR in the last 72 hours.  Urinalysis: No results for input(s): COLORURINE, LABSPEC, PHURINE, GLUCOSEU, HGBUR, BILIRUBINUR, KETONESUR, PROTEINUR, UROBILINOGEN, NITRITE, LEUKOCYTESUR in the last 72 hours.  Invalid input(s): APPERANCEUR    Imaging: DG CHEST PORT 1 VIEW  Result Date: 02/02/2020 CLINICAL DATA:  Pneumonia. EXAM: PORTABLE CHEST 1 VIEW COMPARISON:  01/22/2020. FINDINGS: Tracheostomy tube and right central line in unchanged position. Heart size unchanged. Diffuse bilateral pulmonary infiltrates/edema again noted. Bibasilar atelectasis again noted. Bilateral pleural effusions appear unchanged. No pneumothorax. Chest is unchanged from prior exam. IMPRESSION: 1.  Tracheostomy tube and right central line in unchanged position. 2. Diffuse bilateral pulmonary infiltrates/edema, bibasilar atelectasis, bilateral pleural effusions again noted. Chest is unchanged from prior exam. Electronically Signed   By: Marcello Moores  Register   On: 02/02/2020 07:26     Medications:     heparin sodium (porcine), lidocaine, lidocaine  Assessment/ Plan:  74 y.o. male with a PMHx of acute respiratory failure status post COVID-19 infection status post tracheostomy placement, atrial fibrillation, hypertension, CVA, gout, hypertrophic cardiomyopathy, who was admitted to [facility] on 11/21/2019 for ongoing treatment of acute respiratory failure.  1.  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  Patient  due for hemodialysis today.  Orders are prepared.  Consider placing PermCath next week.  2.  Acute respiratory failure secondary to COVID-19 infection and its sequela.  -Remains off of the ventilator and tachypneic but this has been the case over most of the past week.  Breathing comfortably otherwise.  3.  Hyperkalemia.  Resolved at this time.  4.  Hyponatremia.  Serum sodium stable at 139.  Continue to monitor.  5.  Anemia of chronic kidney disease.  Hemoglobin up to 8.8 posttransfusion.  Continue to monitor CBC..       LOS: 0 Chick Cousins 5/7/20218:19 AM

## 2020-02-03 DIAGNOSIS — I4891 Unspecified atrial fibrillation: Secondary | ICD-10-CM | POA: Diagnosis not present

## 2020-02-03 DIAGNOSIS — U071 COVID-19: Secondary | ICD-10-CM | POA: Diagnosis not present

## 2020-02-03 DIAGNOSIS — J9621 Acute and chronic respiratory failure with hypoxia: Secondary | ICD-10-CM | POA: Diagnosis not present

## 2020-02-03 DIAGNOSIS — N179 Acute kidney failure, unspecified: Secondary | ICD-10-CM | POA: Diagnosis not present

## 2020-02-03 NOTE — Progress Notes (Signed)
Pulmonary Critical Care Medicine [place]   PULMONARY CRITICAL CARE SERVICE  PROGRESS NOTE  Date of Service: 02/03/2020  Shane Banks  XLK:[id number]  DOB: [birth year]   DOA: 11/21/2019  Referring Physician: [name], MD  HPI: Shane Banks is a 74 y.o. male seen for follow up of Acute on Chronic Respiratory Failure.  Patient right now is on T collar has been on 35% FiO2 with good saturations.  Secretions are also reportedly minimal at this time.  Medications: Reviewed on Rounds  Physical Exam:  Vitals: Temperature 98.8 pulse 66 respiratory 28 blood pressure is 121/65 saturations 100%  Ventilator Settings on T collar FiO2 35%  . General: Comfortable at this time . Eyes: Grossly normal lids, irises & conjunctiva . ENT: grossly tongue is normal . Neck: no obvious mass . Cardiovascular: S1 S2 normal no gallop . Respiratory: No rhonchi no rales are noted at this time . Abdomen: soft . Skin: no rash seen on limited exam . Musculoskeletal: not rigid . Psychiatric:unable to assess . Neurologic: no seizure no involuntary movements         Lab Data:   Basic Metabolic Panel: Recent Labs  Lab 01/29/20 0700 01/30/20 0501 01/31/20 0500 02/01/20 0637 02/02/20 0553  NA 138  --  138  --  139  K 3.5  --  3.4* 3.5 3.6  CL 99  --  96*  --  102  CO2 29  --  32  --  29  GLUCOSE 121*  --  121*  --  124*  BUN 125*  --  94*  --  73*  CREATININE 1.99*  --  1.59*  --  1.58*  CALCIUM 10.3  --  9.9  --  9.8  MG 1.7 1.9 1.9  --   --   PHOS 3.7  --  2.1*  --  1.5*    ABG: No results for input(s): PHART, PCO2ART, PO2ART, HCO3, O2SAT in the last 168 hours.  Liver Function Tests: Recent Labs  Lab 01/29/20 0700 01/31/20 0500 02/02/20 0553  ALBUMIN 2.6* 2.6* 2.9*   No results for input(s): LIPASE, AMYLASE in the last 168 hours. No results for input(s): AMMONIA in the last 168 hours.  CBC: Recent Labs  Lab 01/29/20 0700 01/31/20 0500 02/01/20 0637  02/02/20 0553  WBC 14.6* 13.9* 14.1* 13.6*  HGB 7.1* 6.6* 8.5* 8.8*  HCT 23.1* 21.4* 27.0* 27.8*  MCV 85.6 85.9 86.5 85.3  PLT 236 231 232 259    Cardiac Enzymes: No results for input(s): CKTOTAL, CKMB, CKMBINDEX, TROPONINI in the last 168 hours.  BNP (last 3 results) No results for input(s): BNP in the last 8760 hours.  ProBNP (last 3 results) No results for input(s): PROBNP in the last 8760 hours.  Radiological Exams: DG CHEST PORT 1 VIEW  Result Date: 02/02/2020 CLINICAL DATA:  Pneumonia. EXAM: PORTABLE CHEST 1 VIEW COMPARISON:  01/22/2020. FINDINGS: Tracheostomy tube and right central line in unchanged position. Heart size unchanged. Diffuse bilateral pulmonary infiltrates/edema again noted. Bibasilar atelectasis again noted. Bilateral pleural effusions appear unchanged. No pneumothorax. Chest is unchanged from prior exam. IMPRESSION: 1.  Tracheostomy tube and right central line in unchanged position. 2. Diffuse bilateral pulmonary infiltrates/edema, bibasilar atelectasis, bilateral pleural effusions again noted. Chest is unchanged from prior exam. Electronically Signed   By: Marcello Moores  Register   On: 02/02/2020 07:26    Assessment/Plan Active Problems:   Acute on chronic respiratory failure with hypoxia ([place])   COVID-19 virus infection  Atrial fibrillation with RVR (HCC)   Severe sepsis (HCC)   Pneumonia due to COVID-19 virus   1. Acute on chronic respiratory failure hypoxia plan is to continue with T collar trials titrate oxygen as tolerated. 2. COVID-19 virus infection treated resolved 3. Atrial fibrillation rate controlled 4. Severe sepsis hemodynamics are stable 5. Pneumonia due to COVID-19 clinically improving patient still has diffuse changes noted on the chest x-ray   I have personally seen and evaluated the patient, evaluated laboratory and imaging results, formulated the assessment and plan and placed orders. The Patient requires high complexity decision making  with multiple systems involvement.  Rounds were done with the Respiratory Therapy Director and Staff therapists and discussed with nursing staff also.  Allyne Gee, MD [facility] Pulmonary Critical Care Medicine Sleep Medicine

## 2020-02-04 DIAGNOSIS — J9621 Acute and chronic respiratory failure with hypoxia: Secondary | ICD-10-CM | POA: Diagnosis not present

## 2020-02-04 DIAGNOSIS — U071 COVID-19: Secondary | ICD-10-CM | POA: Diagnosis not present

## 2020-02-04 DIAGNOSIS — I4891 Unspecified atrial fibrillation: Secondary | ICD-10-CM | POA: Diagnosis not present

## 2020-02-04 DIAGNOSIS — N179 Acute kidney failure, unspecified: Secondary | ICD-10-CM | POA: Diagnosis not present

## 2020-02-04 NOTE — Progress Notes (Signed)
Pulmonary Critical Care Medicine [place]   PULMONARY CRITICAL CARE SERVICE  PROGRESS NOTE  Date of Service: 02/04/2020  Selvin Yun  FKC:[id number]  DOB: September 24, 1946   DOA: 11/21/2019  Referring Physician: [name], MD  HPI: Joevanni Roddey is a 74 y.o. male seen for follow up of Acute on Chronic Respiratory Failure.  This morning patient was on T collar requiring 35% FiO2 with good saturations.  Secretions are fair to moderate  Medications: Reviewed on Rounds  Physical Exam:  Vitals: Temperature 96.4 pulse 71 respiratory 37 blood pressure is 153/79 saturations 100%  Ventilator Settings on T collar FiO2 35%  . General: Comfortable at this time . Eyes: Grossly normal lids, irises & conjunctiva . ENT: grossly tongue is normal . Neck: no obvious mass . Cardiovascular: S1 S2 normal no gallop . Respiratory: No rhonchi coarse breath sounds are noted at this time . Abdomen: soft . Skin: no rash seen on limited exam . Musculoskeletal: not rigid . Psychiatric:unable to assess . Neurologic: no seizure no involuntary movements         Lab Data:   Basic Metabolic Panel: Recent Labs  Lab 01/29/20 0700 01/30/20 0501 01/31/20 0500 02/01/20 0637 02/02/20 0553  NA 138  --  138  --  139  K 3.5  --  3.4* 3.5 3.6  CL 99  --  96*  --  102  CO2 29  --  32  --  29  GLUCOSE 121*  --  121*  --  124*  BUN 125*  --  94*  --  73*  CREATININE 1.99*  --  1.59*  --  1.58*  CALCIUM 10.3  --  9.9  --  9.8  MG 1.7 1.9 1.9  --   --   PHOS 3.7  --  2.1*  --  1.5*    ABG: No results for input(s): PHART, PCO2ART, PO2ART, HCO3, O2SAT in the last 168 hours.  Liver Function Tests: Recent Labs  Lab 01/29/20 0700 01/31/20 0500 02/02/20 0553  ALBUMIN 2.6* 2.6* 2.9*   No results for input(s): LIPASE, AMYLASE in the last 168 hours. No results for input(s): AMMONIA in the last 168 hours.  CBC: Recent Labs  Lab 01/29/20 0700 01/31/20 0500 02/01/20 0637 02/02/20 0553   WBC 14.6* 13.9* 14.1* 13.6*  HGB 7.1* 6.6* 8.5* 8.8*  HCT 23.1* 21.4* 27.0* 27.8*  MCV 85.6 85.9 86.5 85.3  PLT 236 231 232 259    Cardiac Enzymes: No results for input(s): CKTOTAL, CKMB, CKMBINDEX, TROPONINI in the last 168 hours.  BNP (last 3 results) No results for input(s): BNP in the last 8760 hours.  ProBNP (last 3 results) No results for input(s): PROBNP in the last 8760 hours.  Radiological Exams: No results found.  Assessment/Plan Active Problems:   Acute on chronic respiratory failure with hypoxia (HCC)   COVID-19 virus infection   Atrial fibrillation with RVR (HCC)   Severe sepsis (HCC)   Pneumonia due to COVID-19 virus   1. Acute on chronic respiratory failure hypoxia we will continue with T collar weaning as tolerated titrate oxygen as tolerated continue pulmonary toilet. 2. COVID-19 virus infection in recovery phase we will continue to follow 3. Atrial fibrillation with RVR rate controlled 4. Severe sepsis resolved 5. Pneumonia due to COVID-19 treated clinically improving   I have personally seen and evaluated the patient, evaluated laboratory and imaging results, formulated the assessment and plan and placed orders. The Patient requires high complexity decision making  with multiple systems involvement.  Rounds were done with the Respiratory Therapy Director and Staff therapists and discussed with nursing staff also.  Allyne Gee, MD [facility] Pulmonary Critical Care Medicine Sleep Medicine

## 2020-02-05 DIAGNOSIS — U071 COVID-19: Secondary | ICD-10-CM | POA: Diagnosis not present

## 2020-02-05 DIAGNOSIS — I4891 Unspecified atrial fibrillation: Secondary | ICD-10-CM | POA: Diagnosis not present

## 2020-02-05 DIAGNOSIS — N179 Acute kidney failure, unspecified: Secondary | ICD-10-CM | POA: Diagnosis not present

## 2020-02-05 DIAGNOSIS — J9621 Acute and chronic respiratory failure with hypoxia: Secondary | ICD-10-CM | POA: Diagnosis not present

## 2020-02-05 LAB — RENAL FUNCTION PANEL
Albumin: 2.8 g/dL — ABNORMAL LOW (ref 3.5–5.0)
Anion gap: 10 (ref 5–15)
BUN: 114 mg/dL — ABNORMAL HIGH (ref 8–23)
CO2: 28 mmol/L (ref 22–32)
Calcium: 10.5 mg/dL — ABNORMAL HIGH (ref 8.9–10.3)
Chloride: 103 mmol/L (ref 98–111)
Creatinine, Ser: 2.02 mg/dL — ABNORMAL HIGH (ref 0.61–1.24)
GFR calc Af Amer: 37 mL/min — ABNORMAL LOW (ref >60)
GFR calc non Af Amer: 32 mL/min — ABNORMAL LOW (ref >60)
Glucose, Bld: 125 mg/dL — ABNORMAL HIGH (ref 70–99)
Phosphorus: 2.2 mg/dL — ABNORMAL LOW (ref 2.5–4.6)
Potassium: 3.4 mmol/L — ABNORMAL LOW (ref 3.5–5.1)
Sodium: 141 mmol/L (ref 135–145)

## 2020-02-05 LAB — CBC
HCT: 27.7 % — ABNORMAL LOW (ref 39.0–52.0)
Hemoglobin: 8.7 g/dL — ABNORMAL LOW (ref 13.0–17.0)
MCH: 26.9 pg (ref 26.0–34.0)
MCHC: 31.4 g/dL (ref 30.0–36.0)
MCV: 85.5 fL (ref 80.0–100.0)
Platelets: 246 10*3/uL (ref 150–400)
RBC: 3.24 MIL/uL — ABNORMAL LOW (ref 4.22–5.81)
RDW: 17.9 % — ABNORMAL HIGH (ref 11.5–15.5)
WBC: 15.1 10*3/uL — ABNORMAL HIGH (ref 4.0–10.5)
nRBC: 0 % (ref 0.0–0.2)

## 2020-02-05 LAB — CULTURE, RESPIRATORY W GRAM STAIN

## 2020-02-05 NOTE — Progress Notes (Signed)
Pulmonary Critical Care Medicine [place]   PULMONARY CRITICAL CARE SERVICE  PROGRESS NOTE  Date of Service: 02/05/2020  Shane Banks  JME:[id number]  DOB: [birth year]   DOA: 11/21/2019  Referring Physician: [name], MD  HPI: Shane Banks is a 74 y.o. male seen for follow up of Acute on Chronic Respiratory Failure.  Patient currently is on T collar has been on 35% FiO2 doing well with weaning  Medications: Reviewed on Rounds  Physical Exam:  Vitals: Temperature is 97.4 pulse 72 respiratory 35 blood pressure is 155/80 saturations 95%  Ventilator Settings currently is on T collar with an FiO2 of 35%  . General: Comfortable at this time . Eyes: Grossly normal lids, irises & conjunctiva . ENT: grossly tongue is normal . Neck: no obvious mass . Cardiovascular: S1 S2 normal no gallop . Respiratory: No rhonchi coarse breath sounds are noted at this time . Abdomen: soft . Skin: no rash seen on limited exam . Musculoskeletal: not rigid . Psychiatric:unable to assess . Neurologic: no seizure no involuntary movements         Lab Data:   Basic Metabolic Panel: Recent Labs  Lab 01/30/20 0501 01/31/20 0500 02/01/20 0637 02/02/20 0553 02/05/20 0639  NA  --  138  --  139 141  K  --  3.4* 3.5 3.6 3.4*  CL  --  96*  --  102 103  CO2  --  32  --  29 28  GLUCOSE  --  121*  --  124* 125*  BUN  --  94*  --  73* 114*  CREATININE  --  1.59*  --  1.58* 2.02*  CALCIUM  --  9.9  --  9.8 10.5*  MG 1.9 1.9  --   --   --   PHOS  --  2.1*  --  1.5* 2.2*    ABG: No results for input(s): PHART, PCO2ART, PO2ART, HCO3, O2SAT in the last 168 hours.  Liver Function Tests: Recent Labs  Lab 01/31/20 0500 02/02/20 0553 02/05/20 0639  ALBUMIN 2.6* 2.9* 2.8*   No results for input(s): LIPASE, AMYLASE in the last 168 hours. No results for input(s): AMMONIA in the last 168 hours.  CBC: Recent Labs  Lab 01/31/20 0500 02/01/20 0637 02/02/20 0553 02/05/20 0639   WBC 13.9* 14.1* 13.6* 15.1*  HGB 6.6* 8.5* 8.8* 8.7*  HCT 21.4* 27.0* 27.8* 27.7*  MCV 85.9 86.5 85.3 85.5  PLT 231 232 259 246    Cardiac Enzymes: No results for input(s): CKTOTAL, CKMB, CKMBINDEX, TROPONINI in the last 168 hours.  BNP (last 3 results) No results for input(s): BNP in the last 8760 hours.  ProBNP (last 3 results) No results for input(s): PROBNP in the last 8760 hours.  Radiological Exams: No results found.  Assessment/Plan Active Problems:   Acute on chronic respiratory failure with hypoxia (HCC)   COVID-19 virus infection   Atrial fibrillation with RVR (HCC)   Severe sepsis (HCC)   Pneumonia due to COVID-19 virus   1. Acute on chronic respiratory failure hypoxia we will try to do capping trials titrate as tolerated. 2. COVID-19 virus infection resolved continue supportive care 3. Atrial fibrillation rate is controlled 4. Severe sepsis resolved 5. Pneumonia due to COVID-19 continue supportive care   I have personally seen and evaluated the patient, evaluated laboratory and imaging results, formulated the assessment and plan and placed orders. The Patient requires high complexity decision making with multiple systems involvement.  Rounds were  done with the Respiratory Therapy Director and Staff therapists and discussed with nursing staff also.  Allyne Gee, MD [facility] Pulmonary Critical Care Medicine Sleep Medicine

## 2020-02-05 NOTE — Progress Notes (Signed)
[facility]  ROUNDING NOTE   Subjective:  Patient seen and evaluated during dialysis treatment today. Ultrafiltration target currently 2.0 kg. Blood flow rate 400.  Objective:  Vital signs in last 24 hours:  Temperature 97.4 pulse 72 respirations 35 blood pressure 155/80  Physical Exam: General: Critically ill-appearing  Head: Normocephalic, atraumatic. Moist oral mucosal membranes  Eyes: Anicteric  Neck: Tracheostomy in place  Lungs:  Scattered rhonchi, normal effort  Heart: S1S2 no rubs  Abdomen:  Soft, nontender, bowel sounds present, PEG tube in place  Extremities: 2+ peripheral edema  Neurologic: Awake, alert  Skin: No rash  Access: Right IJ temporary dialysis catheter    Basic Metabolic Panel: Recent Labs  Lab 01/30/20 0501 01/31/20 0500 02/01/20 0637 02/02/20 0553  NA  --  138  --  139  K  --  3.4* 3.5 3.6  CL  --  96*  --  102  CO2  --  32  --  29  GLUCOSE  --  121*  --  124*  BUN  --  94*  --  73*  CREATININE  --  1.59*  --  1.58*  CALCIUM  --  9.9  --  9.8  MG 1.9 1.9  --   --   PHOS  --  2.1*  --  1.5*    Liver Function Tests: Recent Labs  Lab 01/31/20 0500 02/02/20 0553  ALBUMIN 2.6* 2.9*   No results for input(s): LIPASE, AMYLASE in the last 168 hours. No results for input(s): AMMONIA in the last 168 hours.  CBC: Recent Labs  Lab 01/31/20 0500 02/01/20 0637 02/02/20 0553 02/05/20 0639  WBC 13.9* 14.1* 13.6* 15.1*  HGB 6.6* 8.5* 8.8* 8.7*  HCT 21.4* 27.0* 27.8* 27.7*  MCV 85.9 86.5 85.3 85.5  PLT 231 232 259 246    Cardiac Enzymes: No results for input(s): CKTOTAL, CKMB, CKMBINDEX, TROPONINI in the last 168 hours.  BNP: Invalid input(s): POCBNP  CBG: No results for input(s): GLUCAP in the last 168 hours.  Microbiology: Results for orders placed or performed during the hospital encounter of 11/21/19  Culture, Urine     Status: Abnormal   Collection Time: 11/22/19  1:25 PM   Specimen: Urine, Random  Result  Value Ref Range Status   Specimen Description URINE, RANDOM  Final   Special Requests   Final    NONE Performed at [facility], [address] [place], [place] [address]    Culture 50,000 COLONIES/mL YEAST (A)  Final   Report Status 11/23/2019 FINAL  Final  Culture, blood (routine x 2)     Status: None   Collection Time: 11/22/19  1:40 PM   Specimen: BLOOD LEFT HAND  Result Value Ref Range Status   Specimen Description BLOOD LEFT HAND  Final   Special Requests   Final    BOTTLES DRAWN AEROBIC ONLY Blood Culture results may not be optimal due to an inadequate volume of blood received in culture bottles   Culture   Final    NO GROWTH 5 DAYS Performed at [facility], [place] [address], [place], [place] [address]    Report Status 11/27/2019 FINAL  Final  Culture, blood (routine x 2)     Status: None   Collection Time: 11/22/19  1:44 PM   Specimen: BLOOD LEFT HAND  Result Value Ref Range Status   Specimen Description BLOOD LEFT HAND  Final   Special Requests   Final  BOTTLES DRAWN AEROBIC ONLY Blood Culture results may not be optimal due to an inadequate volume of blood received in culture bottles   Culture   Final    NO GROWTH 5 DAYS Performed at [facility], [place] [address] [place], [place] [address]    Report Status 11/27/2019 FINAL  Final  Culture, respiratory (non-expectorated)     Status: None   Collection Time: 11/22/19  4:59 PM   Specimen: Tracheal Aspirate; Respiratory  Result Value Ref Range Status   Specimen Description TRACHEAL ASPIRATE  Final   Special Requests NONE  Final   Gram Stain   Final    ABUNDANT WBC PRESENT, PREDOMINANTLY PMN NO ORGANISMS SEEN    Culture   Final    NO GROWTH 2 DAYS Performed at [facility], [address], [place], [place] [address]    Report Status 11/24/2019 FINAL  Final  Culture, respiratory (non-expectorated)     Status: None   Collection Time: 12/04/19  2:22 AM   Specimen: Tracheal Aspirate;  Respiratory  Result Value Ref Range Status   Specimen Description TRACHEAL ASPIRATE  Final   Special Requests NONE  Final   Gram Stain   Final    ABUNDANT WBC PRESENT, PREDOMINANTLY PMN ABUNDANT GRAM NEGATIVE RODS Performed at [facility], [address] [place], [place] [address]    Culture   Final    MODERATE PSEUDOMONAS AERUGINOSA FEW ESCHERICHIA COLI Confirmed Extended Spectrum Beta-Lactamase Producer (ESBL).  In bloodstream infections from ESBL organisms, carbapenems are preferred over piperacillin/tazobactam. They are shown to have a lower risk of mortality.    Report Status 12/09/2019 FINAL  Final   Organism ID, Bacteria PSEUDOMONAS AERUGINOSA  Final   Organism ID, Bacteria ESCHERICHIA COLI  Final      Susceptibility   Escherichia coli - MIC*    AMPICILLIN >=32 RESISTANT Resistant     CEFAZOLIN >=64 RESISTANT Resistant     CEFEPIME 2 SENSITIVE Sensitive     CEFTAZIDIME RESISTANT Resistant     CEFTRIAXONE >=64 RESISTANT Resistant     CIPROFLOXACIN <=0.25 SENSITIVE Sensitive     GENTAMICIN >=16 RESISTANT Resistant     IMIPENEM <=0.25 SENSITIVE Sensitive     TRIMETH/SULFA >=320 RESISTANT Resistant     AMPICILLIN/SULBACTAM 16 INTERMEDIATE Intermediate     PIP/TAZO <=4 SENSITIVE Sensitive     * FEW ESCHERICHIA COLI   Pseudomonas aeruginosa - MIC*    CEFTAZIDIME 16 INTERMEDIATE Intermediate     CIPROFLOXACIN >=4 RESISTANT Resistant     GENTAMICIN 2 SENSITIVE Sensitive     IMIPENEM >=16 RESISTANT Resistant     * MODERATE PSEUDOMONAS AERUGINOSA  Culture, Urine     Status: Abnormal   Collection Time: 12/04/19  6:33 PM   Specimen: Urine, Random  Result Value Ref Range Status   Specimen Description URINE, RANDOM  Final   Special Requests   Final    NONE Performed at [facility], [address] [place], [place] [address]    Culture >=100,000 COLONIES/mL PSEUDOMONAS AERUGINOSA (A)  Final   Report Status 12/06/2019 FINAL  Final   Organism ID, Bacteria  PSEUDOMONAS AERUGINOSA (A)  Final      Susceptibility   Pseudomonas aeruginosa - MIC*    CEFTAZIDIME 4 SENSITIVE Sensitive     CIPROFLOXACIN >=4 RESISTANT Resistant     GENTAMICIN 4 SENSITIVE Sensitive     IMIPENEM 2 SENSITIVE Sensitive     PIP/TAZO 16 SENSITIVE Sensitive     * >=  100,000 COLONIES/mL PSEUDOMONAS AERUGINOSA  Culture, blood (routine x 2)     Status: Abnormal   Collection Time: 12/11/19  9:39 AM   Specimen: BLOOD LEFT HAND  Result Value Ref Range Status   Specimen Description BLOOD LEFT HAND  Final   Special Requests   Final    BOTTLES DRAWN AEROBIC AND ANAEROBIC Blood Culture adequate volume   Culture  Setup Time   Final    GRAM POSITIVE COCCI IN CLUSTERS ANAEROBIC BOTTLE ONLY CRITICAL RESULT CALLED TO, READ BACK BY AND VERIFIED WITH: RN R [place] [id number] AT 45 BY CM    Culture (A)  Final    STAPHYLOCOCCUS SPECIES (COAGULASE NEGATIVE) THE SIGNIFICANCE OF ISOLATING THIS ORGANISM FROM A SINGLE SET OF BLOOD CULTURES WHEN MULTIPLE SETS ARE DRAWN IS UNCERTAIN. PLEASE NOTIFY THE MICROBIOLOGY DEPARTMENT WITHIN ONE WEEK IF SPECIATION AND SENSITIVITIES ARE REQUIRED. Performed at [facility], [place] [address], [place], [place] [address]    Report Status 12/14/2019 FINAL  Final  Culture, blood (routine x 2)     Status: None   Collection Time: 12/11/19  9:47 AM   Specimen: BLOOD  Result Value Ref Range Status   Specimen Description BLOOD RIGHT ANTECUBITAL  Final   Special Requests   Final    BOTTLES DRAWN AEROBIC AND ANAEROBIC Blood Culture adequate volume   Culture  Setup Time   Final    CORRECTED RESULTS NO ORGANISMS SEEN PREVIOUSLY REPORTED AS: GRAM POSITIVE COCCI IN CLUSTERS CORRECTED RESULTS CALLED TO: RN R [place] [id number] AT 920 AM BY CM    Culture   Final    NO GROWTH 5 DAYS Performed at [facility], [place] [address], [place], [place] [address]    Report Status 12/16/2019 FINAL  Final  Culture, respiratory (non-expectorated)     Status: None    Collection Time: 12/11/19 10:27 AM   Specimen: Tracheal Aspirate; Respiratory  Result Value Ref Range Status   Specimen Description TRACHEAL ASPIRATE  Final   Special Requests NONE  Final   Gram Stain NO WBC SEEN NO ORGANISMS SEEN   Final   Culture   Final    FEW PSEUDOMONAS AERUGINOSA FEW STENOTROPHOMONAS MALTOPHILIA SEE SEPARATE REPORT IN [facility] FOR DOS 12/11/19. Performed at [facility], [place] [address], [place], [place] [address]    Report Status 12/26/2019 FINAL  Final   Organism ID, Bacteria PSEUDOMONAS AERUGINOSA  Final   Organism ID, Bacteria STENOTROPHOMONAS MALTOPHILIA  Final      Susceptibility   Pseudomonas aeruginosa - MIC*    CEFTAZIDIME 16 INTERMEDIATE Intermediate     CIPROFLOXACIN >=4 RESISTANT Resistant     GENTAMICIN 2 SENSITIVE Sensitive     IMIPENEM >=16 RESISTANT Resistant     * FEW PSEUDOMONAS AERUGINOSA   Stenotrophomonas maltophilia - MIC*    LEVOFLOXACIN 0.5 SENSITIVE Sensitive     TRIMETH/SULFA <=20 SENSITIVE Sensitive     * FEW STENOTROPHOMONAS MALTOPHILIA  Culture, Urine     Status: Abnormal   Collection Time: 12/11/19 11:30 AM   Specimen: Urine, Random  Result Value Ref Range Status   Specimen Description URINE, RANDOM  Final   Special Requests NONE  Final   Culture (A)  Final    <10,000 COLONIES/mL INSIGNIFICANT GROWTH Performed at [facility], [place] [address] [place], [place] [address]    Report Status 12/12/2019 FINAL  Final  Culture, blood (routine x 2)     Status: None   Collection Time: 12/15/19 12:37  PM   Specimen: BLOOD LEFT HAND  Result Value Ref Range Status   Specimen Description BLOOD LEFT HAND  Final   Special Requests   Final    BOTTLES DRAWN AEROBIC AND ANAEROBIC Blood Culture adequate volume   Culture   Final    NO GROWTH 5 DAYS Performed at [facility], [address] [place], [place] [address]    Report Status 12/20/2019 FINAL  Final  Culture, blood (routine x 2)     Status: None   Collection Time:  12/15/19 12:42 PM   Specimen: BLOOD LEFT HAND  Result Value Ref Range Status   Specimen Description BLOOD LEFT HAND  Final   Special Requests   Final    BOTTLES DRAWN AEROBIC AND ANAEROBIC Blood Culture adequate volume   Culture   Final    NO GROWTH 5 DAYS Performed at [facility], [place] [address], [place], [place] [address]    Report Status 12/20/2019 FINAL  Final  C Difficile Quick Screen w PCR reflex     Status: None   Collection Time: 12/17/19  7:00 PM  Result Value Ref Range Status   C Diff antigen NEGATIVE NEGATIVE Final   C Diff toxin NEGATIVE NEGATIVE Final   C Diff interpretation No C. difficile detected.  Final    Comment: Performed at [facility], [place] [address] [place], [place] [address]  SARS CORONAVIRUS 2 (TAT 6-24 HRS) Nasopharyngeal Nasopharyngeal Swab     Status: None   Collection Time: 12/20/19 11:45 AM   Specimen: Nasopharyngeal Swab  Result Value Ref Range Status   SARS Coronavirus 2 NEGATIVE NEGATIVE Final    Comment: (NOTE) SARS-CoV-2 target nucleic acids are NOT DETECTED. The SARS-CoV-2 RNA is generally detectable in upper and lower respiratory specimens during the acute phase of infection. Negative results do not preclude SARS-CoV-2 infection, do not rule out co-infections with other pathogens, and should not be used as the sole basis for treatment or other patient management decisions. Negative results must be combined with clinical observations, patient history, and epidemiological information. The expected result is Negative. Fact Sheet for Patients: SugarRoll.be Fact Sheet for Healthcare Providers: https://www.woods-mathews.com/ This test is not yet approved or cleared by the [place] FDA and  has been authorized for detection and/or diagnosis of SARS-CoV-2 by FDA under an Emergency Use Authorization (EUA). This EUA will remain  in effect (meaning this test can be used) for the duration of  the COVID-19 declaration under Section 56 4(b)(1) of the Act, 21 U.S.C. section 360bbb-3(b)(1), unless the authorization is terminated or revoked sooner. Performed at [facility], [place] [address], [place], [place] [address]   Body fluid culture     Status: None   Collection Time: 12/21/19 12:10 PM   Specimen: Lung, Left; Pleural Fluid  Result Value Ref Range Status   Specimen Description PLEURAL FLUID  Final   Special Requests LEFT LUNG THORA  Final   Gram Stain   Final    RARE WBC PRESENT, PREDOMINANTLY MONONUCLEAR NO ORGANISMS SEEN    Culture   Final    NO GROWTH 3 DAYS Performed at [facility], [place] [address] [place], [place] [address]    Report Status 12/24/2019 FINAL  Final  Culture, blood (routine x 2)     Status: None   Collection Time: 12/25/19  2:16 PM   Specimen: BLOOD RIGHT HAND  Result Value Ref Range Status   Specimen Description BLOOD RIGHT  HAND  Final   Special Requests   Final    BOTTLES DRAWN AEROBIC ONLY Blood Culture adequate volume   Culture   Final    NO GROWTH 5 DAYS Performed at [facility], [address] [place], [place] [address]    Report Status 01/01/2020 FINAL  Final  Culture, blood (routine x 2)     Status: None   Collection Time: 12/25/19  2:18 PM   Specimen: BLOOD LEFT HAND  Result Value Ref Range Status   Specimen Description BLOOD LEFT HAND  Final   Special Requests   Final    BOTTLES DRAWN AEROBIC ONLY Blood Culture adequate volume   Culture   Final    NO GROWTH 5 DAYS Performed at [facility], [place] [address], [place], [place] [address]    Report Status 01/01/2020 FINAL  Final  Culture, respiratory (non-expectorated)     Status: None   Collection Time: 12/25/19  4:20 PM   Specimen: Tracheal Aspirate; Respiratory  Result Value Ref Range Status   Specimen Description TRACHEAL ASPIRATE  Final   Special Requests NONE  Final   Gram Stain   Final    ABUNDANT WBC PRESENT, PREDOMINANTLY PMN NO ORGANISMS  SEEN    Culture   Final    RARE Consistent with normal respiratory flora. Performed at [facility], [place] [address] [place], [place] [address]    Report Status 12/28/2019 FINAL  Final  Culture, respiratory (non-expectorated)     Status: None   Collection Time: 01/12/20  4:14 PM   Specimen: Tracheal Aspirate; Respiratory  Result Value Ref Range Status   Specimen Description TRACHEAL ASPIRATE  Final   Special Requests NONE  Final   Gram Stain   Final    ABUNDANT WBC PRESENT, PREDOMINANTLY PMN ABUNDANT GRAM NEGATIVE RODS RARE GRAM POSITIVE RODS Performed at [facility], [place] [address] [place], [place] [address]    Culture   Final    ABUNDANT PSEUDOMONAS AERUGINOSA FEW STENOTROPHOMONAS MALTOPHILIA    Report Status 01/16/2020 FINAL  Final   Organism ID, Bacteria PSEUDOMONAS AERUGINOSA  Final   Organism ID, Bacteria STENOTROPHOMONAS MALTOPHILIA  Final      Susceptibility   Pseudomonas aeruginosa - MIC*    CEFTAZIDIME 16 INTERMEDIATE Intermediate     CIPROFLOXACIN >=4 RESISTANT Resistant     GENTAMICIN 2 SENSITIVE Sensitive     IMIPENEM 2 SENSITIVE Sensitive     * ABUNDANT PSEUDOMONAS AERUGINOSA   Stenotrophomonas maltophilia - MIC*    LEVOFLOXACIN 0.5 SENSITIVE Sensitive     TRIMETH/SULFA <=20 SENSITIVE Sensitive     * FEW STENOTROPHOMONAS MALTOPHILIA  Culture, blood (Routine X 2) w Reflex to ID Panel     Status: None   Collection Time: 01/16/20  3:30 PM   Specimen: BLOOD LEFT HAND  Result Value Ref Range Status   Specimen Description BLOOD LEFT HAND  Final   Special Requests   Final    BOTTLES DRAWN AEROBIC AND ANAEROBIC Blood Culture adequate volume   Culture   Final    NO GROWTH 5 DAYS Performed at [facility], [address] [place], [place] [address]    Report Status 01/21/2020 FINAL  Final  Culture, blood (Routine X 2) w Reflex to ID Panel     Status: None   Collection Time: 01/16/20  3:32 PM   Specimen: BLOOD LEFT ARM  Result Value Ref Range  Status   Specimen Description BLOOD LEFT  ARM  Final   Special Requests   Final    BOTTLES DRAWN AEROBIC AND ANAEROBIC Blood Culture adequate volume   Culture   Final    NO GROWTH 5 DAYS Performed at [facility], [address] [place], [place] [address]    Report Status 01/21/2020 FINAL  Final  Culture, respiratory (non-expectorated)     Status: None (Preliminary result)   Collection Time: 02/02/20  6:30 PM   Specimen: Tracheal Aspirate; Respiratory  Result Value Ref Range Status   Specimen Description TRACHEAL ASPIRATE  Final   Special Requests NONE  Final   Gram Stain   Final    ABUNDANT WBC PRESENT, PREDOMINANTLY PMN ABUNDANT GRAM NEGATIVE RODS    Culture   Final    ABUNDANT PSEUDOMONAS AERUGINOSA SUSCEPTIBILITIES TO FOLLOW Performed at [facility], [address], [place], [place] [address]    Report Status PENDING  Incomplete    Coagulation Studies: No results for input(s): LABPROT, INR in the last 72 hours.  Urinalysis: No results for input(s): COLORURINE, LABSPEC, PHURINE, GLUCOSEU, HGBUR, BILIRUBINUR, KETONESUR, PROTEINUR, UROBILINOGEN, NITRITE, LEUKOCYTESUR in the last 72 hours.  Invalid input(s): APPERANCEUR    Imaging: No results found.   Medications:     heparin sodium (porcine), lidocaine, lidocaine  Assessment/ Plan:  74 y.o. male with a PMHx of acute respiratory failure status post COVID-19 infection status post tracheostomy placement, atrial fibrillation, hypertension, CVA, gout, hypertrophic cardiomyopathy, who was admitted to [facility] on 11/21/2019 for ongoing treatment of acute respiratory failure.  1.  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   -Patient seen and evaluated during hemodialysis treatment today.  Ultrafiltration target 2 kg.  2.  Acute respiratory failure secondary to COVID-19 infection and its sequela.  -Breathing on aerosolized trach collar.  Still mildly  tachypneic but breathing comfortably otherwise.  3.  Hyperkalemia.  Resolved at this time.  4.  Hyponatremia.  Recheck serum sodium today.  5.  Anemia of chronic kidney disease.  Hemoglobin currently 8.7.  Continue to monitor CBC.       LOS: 0 Shane Banks 5/10/20218:44 AM

## 2020-02-06 DIAGNOSIS — I4891 Unspecified atrial fibrillation: Secondary | ICD-10-CM | POA: Diagnosis not present

## 2020-02-06 DIAGNOSIS — N179 Acute kidney failure, unspecified: Secondary | ICD-10-CM | POA: Diagnosis not present

## 2020-02-06 DIAGNOSIS — U071 COVID-19: Secondary | ICD-10-CM | POA: Diagnosis not present

## 2020-02-06 DIAGNOSIS — J9621 Acute and chronic respiratory failure with hypoxia: Secondary | ICD-10-CM | POA: Diagnosis not present

## 2020-02-06 LAB — CBC
HCT: 26.4 % — ABNORMAL LOW (ref 39.0–52.0)
Hemoglobin: 8.3 g/dL — ABNORMAL LOW (ref 13.0–17.0)
MCH: 26.7 pg (ref 26.0–34.0)
MCHC: 31.4 g/dL (ref 30.0–36.0)
MCV: 84.9 fL (ref 80.0–100.0)
Platelets: 210 10*3/uL (ref 150–400)
RBC: 3.11 MIL/uL — ABNORMAL LOW (ref 4.22–5.81)
RDW: 17.8 % — ABNORMAL HIGH (ref 11.5–15.5)
WBC: 15.3 10*3/uL — ABNORMAL HIGH (ref 4.0–10.5)
nRBC: 0 % (ref 0.0–0.2)

## 2020-02-06 NOTE — Progress Notes (Signed)
Pulmonary [place]   PULMONARY CRITICAL CARE SERVICE  PROGRESS NOTE  Date of Service: 02/06/2020  Hari Casaus  SEG:[id number]  DOB: 10-22-1945   DOA: 11/21/2019  Referring Physician: [name], MD  HPI: Shane Banks is a 74 y.o. male seen for follow up of Acute on Chronic Respiratory Failure.  Patient currently is on T collar has been on 35% FiO2 was attempted at capping apparently yesterday did not do well so is back on the T collar at this point  Medications: Reviewed on Rounds  Physical Exam:  Vitals: Temperature 97.1 pulse 71 respiratory 30 blood pressure is 121/61 saturations 100%  Ventilator Settings on T collar with an FiO2 of 35%  . General: Comfortable at this time . Eyes: Grossly normal lids, irises & conjunctiva . ENT: grossly tongue is normal . Neck: no obvious mass . Cardiovascular: S1 S2 normal no gallop . Respiratory: No rhonchi coarse breath sounds are noted at this time . Abdomen: soft . Skin: no rash seen on limited exam . Musculoskeletal: not rigid . Psychiatric:unable to assess . Neurologic: no seizure no involuntary movements         Lab Data:   Basic Metabolic Panel: Recent Labs  Lab 01/31/20 0500 02/01/20 0637 02/02/20 0553 02/05/20 0639  NA 138  --  139 141  K 3.4* 3.5 3.6 3.4*  CL 96*  --  102 103  CO2 32  --  29 28  GLUCOSE 121*  --  124* 125*  BUN 94*  --  73* 114*  CREATININE 1.59*  --  1.58* 2.02*  CALCIUM 9.9  --  9.8 10.5*  MG 1.9  --   --   --   PHOS 2.1*  --  1.5* 2.2*    ABG: No results for input(s): PHART, PCO2ART, PO2ART, HCO3, O2SAT in the last 168 hours.  Liver Function Tests: Recent Labs  Lab 01/31/20 0500 02/02/20 0553 02/05/20 0639  ALBUMIN 2.6* 2.9* 2.8*   No results for input(s): LIPASE, AMYLASE in the last 168 hours. No results for input(s): AMMONIA in the last 168 hours.  CBC: Recent Labs  Lab 01/31/20 0500 02/01/20 0637 02/02/20 0553 02/05/20 0639  02/06/20 0609  WBC 13.9* 14.1* 13.6* 15.1* 15.3*  HGB 6.6* 8.5* 8.8* 8.7* 8.3*  HCT 21.4* 27.0* 27.8* 27.7* 26.4*  MCV 85.9 86.5 85.3 85.5 84.9  PLT 231 232 259 246 210    Cardiac Enzymes: No results for input(s): CKTOTAL, CKMB, CKMBINDEX, TROPONINI in the last 168 hours.  BNP (last 3 results) No results for input(s): BNP in the last 8760 hours.  ProBNP (last 3 results) No results for input(s): PROBNP in the last 8760 hours.  Radiological Exams: No results found.  Assessment/Plan Active Problems:   Acute on chronic respiratory failure with hypoxia (HCC)   COVID-19 virus infection   Atrial fibrillation with RVR (HCC)   Severe sepsis (HCC)   Pneumonia due to COVID-19 virus   1. Acute on chronic respiratory failure with hypoxia we will continue with T collar trials titrate oxygen continue pulmonary toilet.  Patient is currently on 35% FiO2 was attempted at capping did not do well 2. COVID-19 virus infection treated we will continue to follow 3. Atrial fibrillation rate controlled 4. Severe sepsis resolved 5. Pneumonia due to COVID-19 clinically improved patient still has significant secretions however   I have personally seen and evaluated the patient, evaluated laboratory and imaging results, formulated the assessment and plan and placed orders.  The Patient requires high complexity decision making with multiple systems involvement.  Rounds were done with the Respiratory Therapy Director and Staff therapists and discussed with nursing staff also.  Allyne Gee, MD [facility] Pulmonary Critical Care Medicine Sleep Medicine

## 2020-02-07 DIAGNOSIS — N179 Acute kidney failure, unspecified: Secondary | ICD-10-CM | POA: Diagnosis not present

## 2020-02-07 DIAGNOSIS — U071 COVID-19: Secondary | ICD-10-CM | POA: Diagnosis not present

## 2020-02-07 DIAGNOSIS — J9621 Acute and chronic respiratory failure with hypoxia: Secondary | ICD-10-CM | POA: Diagnosis not present

## 2020-02-07 DIAGNOSIS — I4891 Unspecified atrial fibrillation: Secondary | ICD-10-CM | POA: Diagnosis not present

## 2020-02-07 LAB — RENAL FUNCTION PANEL
Albumin: 2.6 g/dL — ABNORMAL LOW (ref 3.5–5.0)
Anion gap: 11 (ref 5–15)
BUN: 116 mg/dL — ABNORMAL HIGH (ref 8–23)
CO2: 29 mmol/L (ref 22–32)
Calcium: 11.2 mg/dL — ABNORMAL HIGH (ref 8.9–10.3)
Chloride: 102 mmol/L (ref 98–111)
Creatinine, Ser: 1.88 mg/dL — ABNORMAL HIGH (ref 0.61–1.24)
GFR calc Af Amer: 40 mL/min — ABNORMAL LOW (ref >60)
GFR calc non Af Amer: 35 mL/min — ABNORMAL LOW (ref >60)
Glucose, Bld: 127 mg/dL — ABNORMAL HIGH (ref 70–99)
Phosphorus: 1.3 mg/dL — ABNORMAL LOW (ref 2.5–4.6)
Potassium: 3.2 mmol/L — ABNORMAL LOW (ref 3.5–5.1)
Sodium: 142 mmol/L (ref 135–145)

## 2020-02-07 LAB — CBC
HCT: 27.8 % — ABNORMAL LOW (ref 39.0–52.0)
Hemoglobin: 8.7 g/dL — ABNORMAL LOW (ref 13.0–17.0)
MCH: 26.4 pg (ref 26.0–34.0)
MCHC: 31.3 g/dL (ref 30.0–36.0)
MCV: 84.5 fL (ref 80.0–100.0)
Platelets: 197 10*3/uL (ref 150–400)
RBC: 3.29 MIL/uL — ABNORMAL LOW (ref 4.22–5.81)
RDW: 17.7 % — ABNORMAL HIGH (ref 11.5–15.5)
WBC: 15.7 10*3/uL — ABNORMAL HIGH (ref 4.0–10.5)
nRBC: 0 % (ref 0.0–0.2)

## 2020-02-07 NOTE — Progress Notes (Signed)
[facility]  ROUNDING NOTE   Subjective:  Patient seen and evaluated at bedside. Due for dialysis treatment today. Appears to be breathing comfortably though tachypneic.  Objective:  Vital signs in last 24 hours:  Temperature 98.5 pulse 73 respirations 40 blood pressure 138/65  Physical Exam: General: Critically ill-appearing  Head: Normocephalic, atraumatic. Moist oral mucosal membranes  Eyes: Anicteric  Neck: Tracheostomy in place  Lungs:  Scattered rhonchi, normal effort  Heart: S1S2 no rubs  Abdomen:  Soft, nontender, bowel sounds present, PEG tube in place  Extremities: 2+ peripheral edema  Neurologic: Awake, alert  Skin: No rash  Access: Right IJ temporary dialysis catheter    Basic Metabolic Panel: Recent Labs  Lab 02/01/20 0637 02/02/20 0553 02/05/20 0639  NA  --  139 141  K 3.5 3.6 3.4*  CL  --  102 103  CO2  --  29 28  GLUCOSE  --  124* 125*  BUN  --  73* 114*  CREATININE  --  1.58* 2.02*  CALCIUM  --  9.8 10.5*  PHOS  --  1.5* 2.2*    Liver Function Tests: Recent Labs  Lab 02/02/20 0553 02/05/20 0639  ALBUMIN 2.9* 2.8*   No results for input(s): LIPASE, AMYLASE in the last 168 hours. No results for input(s): AMMONIA in the last 168 hours.  CBC: Recent Labs  Lab 02/01/20 0637 02/02/20 0553 02/05/20 0639 02/06/20 0609 02/07/20 0730  WBC 14.1* 13.6* 15.1* 15.3* 15.7*  HGB 8.5* 8.8* 8.7* 8.3* 8.7*  HCT 27.0* 27.8* 27.7* 26.4* 27.8*  MCV 86.5 85.3 85.5 84.9 84.5  PLT 232 259 246 210 197    Cardiac Enzymes: No results for input(s): CKTOTAL, CKMB, CKMBINDEX, TROPONINI in the last 168 hours.  BNP: Invalid input(s): POCBNP  CBG: No results for input(s): GLUCAP in the last 168 hours.  Microbiology: Results for orders placed or performed during the hospital encounter of 11/21/19  Culture, Urine     Status: Abnormal   Collection Time: 11/22/19  1:25 PM   Specimen: Urine, Random  Result Value Ref Range Status   Specimen  Description URINE, RANDOM  Final   Special Requests   Final    NONE Performed at [facility], [address] [place], [place] [address]    Culture 50,000 COLONIES/mL YEAST (A)  Final   Report Status 11/23/2019 FINAL  Final  Culture, blood (routine x 2)     Status: None   Collection Time: 11/22/19  1:40 PM   Specimen: BLOOD LEFT HAND  Result Value Ref Range Status   Specimen Description BLOOD LEFT HAND  Final   Special Requests   Final    BOTTLES DRAWN AEROBIC ONLY Blood Culture results may not be optimal due to an inadequate volume of blood received in culture bottles   Culture   Final    NO GROWTH 5 DAYS Performed at [facility], [place] [address], [place], [place] [address]    Report Status 11/27/2019 FINAL  Final  Culture, blood (routine x 2)     Status: None   Collection Time: 11/22/19  1:44 PM   Specimen: BLOOD LEFT HAND  Result Value Ref Range Status   Specimen Description BLOOD LEFT HAND  Final   Special Requests   Final    BOTTLES DRAWN AEROBIC ONLY Blood Culture results may not be optimal due to an inadequate volume of blood received in culture bottles   Culture   Final  NO GROWTH 5 DAYS Performed at [facility], [place] [address] [place], [place] [address]    Report Status 11/27/2019 FINAL  Final  Culture, respiratory (non-expectorated)     Status: None   Collection Time: 11/22/19  4:59 PM   Specimen: Tracheal Aspirate; Respiratory  Result Value Ref Range Status   Specimen Description TRACHEAL ASPIRATE  Final   Special Requests NONE  Final   Gram Stain   Final    ABUNDANT WBC PRESENT, PREDOMINANTLY PMN NO ORGANISMS SEEN    Culture   Final    NO GROWTH 2 DAYS Performed at [facility], [address] [place], [place] [address]    Report Status 11/24/2019 FINAL  Final  Culture, respiratory (non-expectorated)     Status: None   Collection Time: 12/04/19  2:22 AM   Specimen: Tracheal Aspirate; Respiratory  Result Value Ref Range Status    Specimen Description TRACHEAL ASPIRATE  Final   Special Requests NONE  Final   Gram Stain   Final    ABUNDANT WBC PRESENT, PREDOMINANTLY PMN ABUNDANT GRAM NEGATIVE RODS Performed at [facility], [address], [place], [place] [address]    Culture   Final    MODERATE PSEUDOMONAS AERUGINOSA FEW ESCHERICHIA COLI Confirmed Extended Spectrum Beta-Lactamase Producer (ESBL).  In bloodstream infections from ESBL organisms, carbapenems are preferred over piperacillin/tazobactam. They are shown to have a lower risk of mortality.    Report Status 12/09/2019 FINAL  Final   Organism ID, Bacteria PSEUDOMONAS AERUGINOSA  Final   Organism ID, Bacteria ESCHERICHIA COLI  Final      Susceptibility   Escherichia coli - MIC*    AMPICILLIN >=32 RESISTANT Resistant     CEFAZOLIN >=64 RESISTANT Resistant     CEFEPIME 2 SENSITIVE Sensitive     CEFTAZIDIME RESISTANT Resistant     CEFTRIAXONE >=64 RESISTANT Resistant     CIPROFLOXACIN <=0.25 SENSITIVE Sensitive     GENTAMICIN >=16 RESISTANT Resistant     IMIPENEM <=0.25 SENSITIVE Sensitive     TRIMETH/SULFA >=320 RESISTANT Resistant     AMPICILLIN/SULBACTAM 16 INTERMEDIATE Intermediate     PIP/TAZO <=4 SENSITIVE Sensitive     * FEW ESCHERICHIA COLI   Pseudomonas aeruginosa - MIC*    CEFTAZIDIME 16 INTERMEDIATE Intermediate     CIPROFLOXACIN >=4 RESISTANT Resistant     GENTAMICIN 2 SENSITIVE Sensitive     IMIPENEM >=16 RESISTANT Resistant     * MODERATE PSEUDOMONAS AERUGINOSA  Culture, Urine     Status: Abnormal   Collection Time: 12/04/19  6:33 PM   Specimen: Urine, Random  Result Value Ref Range Status   Specimen Description URINE, RANDOM  Final   Special Requests   Final    NONE Performed at [facility], [address] [place], [place] [address]    Culture >=100,000 COLONIES/mL PSEUDOMONAS AERUGINOSA (A)  Final   Report Status 12/06/2019 FINAL  Final   Organism ID, Bacteria PSEUDOMONAS AERUGINOSA (A)  Final       Susceptibility   Pseudomonas aeruginosa - MIC*    CEFTAZIDIME 4 SENSITIVE Sensitive     CIPROFLOXACIN >=4 RESISTANT Resistant     GENTAMICIN 4 SENSITIVE Sensitive     IMIPENEM 2 SENSITIVE Sensitive     PIP/TAZO 16 SENSITIVE Sensitive     * >=100,000 COLONIES/mL PSEUDOMONAS AERUGINOSA  Culture, blood (routine x 2)     Status: Abnormal   Collection Time: 12/11/19  9:39 AM   Specimen: BLOOD LEFT  HAND  Result Value Ref Range Status   Specimen Description BLOOD LEFT HAND  Final   Special Requests   Final    BOTTLES DRAWN AEROBIC AND ANAEROBIC Blood Culture adequate volume   Culture  Setup Time   Final    GRAM POSITIVE COCCI IN CLUSTERS ANAEROBIC BOTTLE ONLY CRITICAL RESULT CALLED TO, READ BACK BY AND VERIFIED WITH: RN R [place] [id number] AT 64 BY CM    Culture (A)  Final    STAPHYLOCOCCUS SPECIES (COAGULASE NEGATIVE) THE SIGNIFICANCE OF ISOLATING THIS ORGANISM FROM A SINGLE SET OF BLOOD CULTURES WHEN MULTIPLE SETS ARE DRAWN IS UNCERTAIN. PLEASE NOTIFY THE MICROBIOLOGY DEPARTMENT WITHIN ONE WEEK IF SPECIATION AND SENSITIVITIES ARE REQUIRED. Performed at [facility], [place] [address], [place], [place] [address]    Report Status 12/14/2019 FINAL  Final  Culture, blood (routine x 2)     Status: None   Collection Time: 12/11/19  9:47 AM   Specimen: BLOOD  Result Value Ref Range Status   Specimen Description BLOOD RIGHT ANTECUBITAL  Final   Special Requests   Final    BOTTLES DRAWN AEROBIC AND ANAEROBIC Blood Culture adequate volume   Culture  Setup Time   Final    CORRECTED RESULTS NO ORGANISMS SEEN PREVIOUSLY REPORTED AS: GRAM POSITIVE COCCI IN CLUSTERS CORRECTED RESULTS CALLED TO: RN R CUMMINGS [id number] AT 920 AM BY CM    Culture   Final    NO GROWTH 5 DAYS Performed at [facility], [place] [address] [place], [place] [address]    Report Status 12/16/2019 FINAL  Final  Culture, respiratory (non-expectorated)     Status: None   Collection Time: 12/11/19 10:27 AM    Specimen: Tracheal Aspirate; Respiratory  Result Value Ref Range Status   Specimen Description TRACHEAL ASPIRATE  Final   Special Requests NONE  Final   Gram Stain NO WBC SEEN NO ORGANISMS SEEN   Final   Culture   Final    FEW PSEUDOMONAS AERUGINOSA FEW STENOTROPHOMONAS MALTOPHILIA SEE SEPARATE REPORT IN [facility] FOR DOS 12/11/19. Performed at [facility], [place] [address], [place], [place] [address]    Report Status 12/26/2019 FINAL  Final   Organism ID, Bacteria PSEUDOMONAS AERUGINOSA  Final   Organism ID, Bacteria STENOTROPHOMONAS MALTOPHILIA  Final      Susceptibility   Pseudomonas aeruginosa - MIC*    CEFTAZIDIME 16 INTERMEDIATE Intermediate     CIPROFLOXACIN >=4 RESISTANT Resistant     GENTAMICIN 2 SENSITIVE Sensitive     IMIPENEM >=16 RESISTANT Resistant     * FEW PSEUDOMONAS AERUGINOSA   Stenotrophomonas maltophilia - MIC*    LEVOFLOXACIN 0.5 SENSITIVE Sensitive     TRIMETH/SULFA <=20 SENSITIVE Sensitive     * FEW STENOTROPHOMONAS MALTOPHILIA  Culture, Urine     Status: Abnormal   Collection Time: 12/11/19 11:30 AM   Specimen: Urine, Random  Result Value Ref Range Status   Specimen Description URINE, RANDOM  Final   Special Requests NONE  Final   Culture (A)  Final    <10,000 COLONIES/mL INSIGNIFICANT GROWTH Performed at [facility], [place] [address] [place],  [address]    Report Status 12/12/2019 FINAL  Final  Culture, blood (routine x 2)     Status: None   Collection Time: 12/15/19 12:37 PM   Specimen: BLOOD LEFT HAND  Result Value Ref Range Status   Specimen Description BLOOD LEFT HAND  Final   Special Requests   Final  BOTTLES DRAWN AEROBIC AND ANAEROBIC Blood Culture adequate volume   Culture   Final    NO GROWTH 5 DAYS Performed at [facility], [place] [address], [place], [place] [address]    Report Status 12/20/2019 FINAL  Final  Culture, blood (routine x 2)     Status: None   Collection Time: 12/15/19 12:42 PM   Specimen: BLOOD  LEFT HAND  Result Value Ref Range Status   Specimen Description BLOOD LEFT HAND  Final   Special Requests   Final    BOTTLES DRAWN AEROBIC AND ANAEROBIC Blood Culture adequate volume   Culture   Final    NO GROWTH 5 DAYS Performed at [facility], [place] [address] [place], [place] [address]    Report Status 12/20/2019 FINAL  Final  C Difficile Quick Screen w PCR reflex     Status: None   Collection Time: 12/17/19  7:00 PM  Result Value Ref Range Status   C Diff antigen NEGATIVE NEGATIVE Final   C Diff toxin NEGATIVE NEGATIVE Final   C Diff interpretation No C. difficile detected.  Final    Comment: Performed at [facility], [place] [address] [place], [place] [address]  SARS CORONAVIRUS 2 (TAT 6-24 HRS) Nasopharyngeal Nasopharyngeal Swab     Status: None   Collection Time: 12/20/19 11:45 AM   Specimen: Nasopharyngeal Swab  Result Value Ref Range Status   SARS Coronavirus 2 NEGATIVE NEGATIVE Final    Comment: (NOTE) SARS-CoV-2 target nucleic acids are NOT DETECTED. The SARS-CoV-2 RNA is generally detectable in upper and lower respiratory specimens during the acute phase of infection. Negative results do not preclude SARS-CoV-2 infection, do not rule out co-infections with other pathogens, and should not be used as the sole basis for treatment or other patient management decisions. Negative results must be combined with clinical observations, patient history, and epidemiological information. The expected result is Negative. Fact Sheet for Patients: SugarRoll.be Fact Sheet for Healthcare Providers: https://www.woods-mathews.com/ This test is not yet approved or cleared by the [place] FDA and  has been authorized for detection and/or diagnosis of SARS-CoV-2 by FDA under an Emergency Use Authorization (EUA). This EUA will remain  in effect (meaning this test can be used) for the duration of the COVID-19 declaration under  Section 56 4(b)(1) of the Act, 21 U.S.C. section 360bbb-3(b)(1), unless the authorization is terminated or revoked sooner. Performed at [facility], [place] [address] [place], [place] [address]   Body fluid culture     Status: None   Collection Time: 12/21/19 12:10 PM   Specimen: Lung, Left; Pleural Fluid  Result Value Ref Range Status   Specimen Description PLEURAL FLUID  Final   Special Requests LEFT LUNG THORA  Final   Gram Stain   Final    RARE WBC PRESENT, PREDOMINANTLY MONONUCLEAR NO ORGANISMS SEEN    Culture   Final    NO GROWTH 3 DAYS Performed at [facility], [place] [address], [place], [place] [address]    Report Status 12/24/2019 FINAL  Final  Culture, blood (routine x 2)     Status: None   Collection Time: 12/25/19  2:16 PM   Specimen: BLOOD RIGHT HAND  Result Value Ref Range Status   Specimen Description BLOOD RIGHT HAND  Final   Special Requests   Final    BOTTLES DRAWN AEROBIC ONLY Blood Culture adequate volume   Culture   Final    NO GROWTH  5 DAYS Performed at [facility], [place] [address] [place], [place] [address]    Report Status 01/01/2020 FINAL  Final  Culture, blood (routine x 2)     Status: None   Collection Time: 12/25/19  2:18 PM   Specimen: BLOOD LEFT HAND  Result Value Ref Range Status   Specimen Description BLOOD LEFT HAND  Final   Special Requests   Final    BOTTLES DRAWN AEROBIC ONLY Blood Culture adequate volume   Culture   Final    NO GROWTH 5 DAYS Performed at [facility], [place] [address] [place], [place] [address]    Report Status 01/01/2020 FINAL  Final  Culture, respiratory (non-expectorated)     Status: None   Collection Time: 12/25/19  4:20 PM   Specimen: Tracheal Aspirate; Respiratory  Result Value Ref Range Status   Specimen Description TRACHEAL ASPIRATE  Final   Special Requests NONE  Final   Gram Stain   Final    ABUNDANT WBC PRESENT, PREDOMINANTLY PMN NO ORGANISMS SEEN    Culture   Final    RARE  Consistent with normal respiratory flora. Performed at [facility], [place] [address] [place], [place] [address]    Report Status 12/28/2019 FINAL  Final  Culture, respiratory (non-expectorated)     Status: None   Collection Time: 01/12/20  4:14 PM   Specimen: Tracheal Aspirate; Respiratory  Result Value Ref Range Status   Specimen Description TRACHEAL ASPIRATE  Final   Special Requests NONE  Final   Gram Stain   Final    ABUNDANT WBC PRESENT, PREDOMINANTLY PMN ABUNDANT GRAM NEGATIVE RODS RARE GRAM POSITIVE RODS Performed at [facility], [place] [address] [place], [place] [address]    Culture   Final    ABUNDANT PSEUDOMONAS AERUGINOSA FEW STENOTROPHOMONAS MALTOPHILIA    Report Status 01/16/2020 FINAL  Final   Organism ID, Bacteria PSEUDOMONAS AERUGINOSA  Final   Organism ID, Bacteria STENOTROPHOMONAS MALTOPHILIA  Final      Susceptibility   Pseudomonas aeruginosa - MIC*    CEFTAZIDIME 16 INTERMEDIATE Intermediate     CIPROFLOXACIN >=4 RESISTANT Resistant     GENTAMICIN 2 SENSITIVE Sensitive     IMIPENEM 2 SENSITIVE Sensitive     * ABUNDANT PSEUDOMONAS AERUGINOSA   Stenotrophomonas maltophilia - MIC*    LEVOFLOXACIN 0.5 SENSITIVE Sensitive     TRIMETH/SULFA <=20 SENSITIVE Sensitive     * FEW STENOTROPHOMONAS MALTOPHILIA  Culture, blood (Routine X 2) w Reflex to ID Panel     Status: None   Collection Time: 01/16/20  3:30 PM   Specimen: BLOOD LEFT HAND  Result Value Ref Range Status   Specimen Description BLOOD LEFT HAND  Final   Special Requests   Final    BOTTLES DRAWN AEROBIC AND ANAEROBIC Blood Culture adequate volume   Culture   Final    NO GROWTH 5 DAYS Performed at [facility], [address] [place], [place] [address]    Report Status 01/21/2020 FINAL  Final  Culture, blood (Routine X 2) w Reflex to ID Panel     Status: None   Collection Time: 01/16/20  3:32 PM   Specimen: BLOOD LEFT ARM  Result Value Ref Range Status   Specimen Description  BLOOD LEFT ARM  Final   Special Requests   Final    BOTTLES DRAWN AEROBIC AND ANAEROBIC Blood Culture adequate volume   Culture   Final    NO  GROWTH 5 DAYS Performed at [facility], [place] [address], [place], [place] [address]    Report Status 01/21/2020 FINAL  Final  Culture, respiratory (non-expectorated)     Status: None   Collection Time: 02/02/20  6:30 PM   Specimen: Tracheal Aspirate; Respiratory  Result Value Ref Range Status   Specimen Description TRACHEAL ASPIRATE  Final   Special Requests NONE  Final   Gram Stain   Final    ABUNDANT WBC PRESENT, PREDOMINANTLY PMN ABUNDANT GRAM NEGATIVE RODS Performed at [facility], [address] [place], [place] [address]    Culture ABUNDANT PSEUDOMONAS AERUGINOSA  Final   Report Status 02/05/2020 FINAL  Final   Organism ID, Bacteria PSEUDOMONAS AERUGINOSA  Final      Susceptibility   Pseudomonas aeruginosa - MIC*    CEFTAZIDIME 16 INTERMEDIATE Intermediate     CIPROFLOXACIN >=4 RESISTANT Resistant     GENTAMICIN 4 SENSITIVE Sensitive     IMIPENEM >=16 RESISTANT Resistant     CEFEPIME INTERMEDIATE Intermediate     * ABUNDANT PSEUDOMONAS AERUGINOSA    Coagulation Studies: No results for input(s): LABPROT, INR in the last 72 hours.  Urinalysis: No results for input(s): COLORURINE, LABSPEC, PHURINE, GLUCOSEU, HGBUR, BILIRUBINUR, KETONESUR, PROTEINUR, UROBILINOGEN, NITRITE, LEUKOCYTESUR in the last 72 hours.  Invalid input(s): APPERANCEUR    Imaging: No results found.   Medications:     heparin sodium (porcine), lidocaine, lidocaine  Assessment/ Plan:  74 y.o. male with a PMHx of acute respiratory failure status post COVID-19 infection status post tracheostomy placement, atrial fibrillation, hypertension, CVA, gout, hypertrophic cardiomyopathy, who was admitted to [facility] on 11/21/2019 for ongoing treatment of acute respiratory failure.  1.  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   -Patient due for dialysis treatment today.  Continue dialysis on MWF schedule.  2.  Acute respiratory failure secondary to COVID-19 infection and its sequela.  -Tachypnea persist but breathing comfortably otherwise.  3.  Hyperkalemia.  Resolved at this time.  4.  Hyponatremia.  Serum sodium up to 141.  Continue to periodically monitor.  5.  Anemia of chronic kidney disease.  Hemoglobin improved slightly to 8.7.  Continue to monitor CBC.       LOS: 0 Pearson Reasons 5/12/20218:48 AM

## 2020-02-07 NOTE — Progress Notes (Signed)
Pulmonary [facility]   PULMONARY CRITICAL CARE SERVICE  PROGRESS NOTE  Date of Service: 02/07/2020  Shane Banks  WOE:[id number]  DOB: 1945/12/30   DOA: 11/21/2019  Referring Physician: [name], MD  HPI: Shane Banks is a 74 y.o. male seen for follow up of Acute on Chronic Respiratory Failure.  Patient at this time is on T collar has been on 20% FiO2 secretions are still reportedly copious  Medications: Reviewed on Rounds  Physical Exam:  Vitals: Temperature 98.5 pulse 73 respiratory 20 blood pressure is 130/65 saturations 99%  Ventilator Settings on T collar with an FiO2 of 28%  . General: Comfortable at this time . Eyes: Grossly normal lids, irises & conjunctiva . ENT: grossly tongue is normal . Neck: no obvious mass . Cardiovascular: S1 S2 normal no gallop . Respiratory: No rhonchi no rales are noted at this time . Abdomen: soft . Skin: no rash seen on limited exam . Musculoskeletal: not rigid . Psychiatric:unable to assess . Neurologic: no seizure no involuntary movements         Lab Data:   Basic Metabolic Panel: Recent Labs  Lab 02/01/20 0637 02/02/20 0553 02/05/20 0639 02/07/20 0730  NA  --  139 141 142  K 3.5 3.6 3.4* 3.2*  CL  --  102 103 102  CO2  --  29 28 29   GLUCOSE  --  124* 125* 127*  BUN  --  73* 114* 116*  CREATININE  --  1.58* 2.02* 1.88*  CALCIUM  --  9.8 10.5* 11.2*  PHOS  --  1.5* 2.2* 1.3*    ABG: No results for input(s): PHART, PCO2ART, PO2ART, HCO3, O2SAT in the last 168 hours.  Liver Function Tests: Recent Labs  Lab 02/02/20 0553 02/05/20 0639 02/07/20 0730  ALBUMIN 2.9* 2.8* 2.6*   No results for input(s): LIPASE, AMYLASE in the last 168 hours. No results for input(s): AMMONIA in the last 168 hours.  CBC: Recent Labs  Lab 02/01/20 0637 02/02/20 0553 02/05/20 0639 02/06/20 0609 02/07/20 0730  WBC 14.1* 13.6* 15.1* 15.3* 15.7*  HGB 8.5* 8.8* 8.7* 8.3* 8.7*  HCT 27.0*  27.8* 27.7* 26.4* 27.8*  MCV 86.5 85.3 85.5 84.9 84.5  PLT 232 259 246 210 197    Cardiac Enzymes: No results for input(s): CKTOTAL, CKMB, CKMBINDEX, TROPONINI in the last 168 hours.  BNP (last 3 results) No results for input(s): BNP in the last 8760 hours.  ProBNP (last 3 results) No results for input(s): PROBNP in the last 8760 hours.  Radiological Exams: No results found.  Assessment/Plan Active Problems:   Acute on chronic respiratory failure with hypoxia (HCC)   COVID-19 virus infection   Atrial fibrillation with RVR (HCC)   Severe sepsis (HCC)   Pneumonia due to COVID-19 virus   1. Acute on chronic respiratory failure hypoxia we will continue with T collar trials titrate oxygen as tolerated continue pulmonary toilet. 2. COVID-19 virus infection at baseline we will continue to follow 3. Atrial fibrillation with RVR rate controlled 4. Severe sepsis resolved 5. Pneumonia due to COVID-19 clinically improved   I have personally seen and evaluated the patient, evaluated laboratory and imaging results, formulated the assessment and plan and placed orders. The Patient requires high complexity decision making with multiple systems involvement.  Rounds were done with the Respiratory Therapy Director and Staff therapists and discussed with nursing staff also.  Allyne Gee, MD [facility] Pulmonary Critical Care Medicine Sleep Medicine

## 2020-02-08 DIAGNOSIS — U071 COVID-19: Secondary | ICD-10-CM | POA: Diagnosis not present

## 2020-02-08 DIAGNOSIS — J9621 Acute and chronic respiratory failure with hypoxia: Secondary | ICD-10-CM | POA: Diagnosis not present

## 2020-02-08 DIAGNOSIS — I4891 Unspecified atrial fibrillation: Secondary | ICD-10-CM | POA: Diagnosis not present

## 2020-02-08 DIAGNOSIS — N179 Acute kidney failure, unspecified: Secondary | ICD-10-CM | POA: Diagnosis not present

## 2020-02-08 LAB — RENAL FUNCTION PANEL
Albumin: 2.9 g/dL — ABNORMAL LOW (ref 3.5–5.0)
Anion gap: 11 (ref 5–15)
BUN: 64 mg/dL — ABNORMAL HIGH (ref 8–23)
CO2: 28 mmol/L (ref 22–32)
Calcium: 10.1 mg/dL (ref 8.9–10.3)
Chloride: 100 mmol/L (ref 98–111)
Creatinine, Ser: 1.3 mg/dL — ABNORMAL HIGH (ref 0.61–1.24)
GFR calc Af Amer: >60 mL/min (ref >60)
GFR calc non Af Amer: 55 mL/min — ABNORMAL LOW (ref >60)
Glucose, Bld: 128 mg/dL — ABNORMAL HIGH (ref 70–99)
Phosphorus: <1 mg/dL — CL (ref 2.5–4.6)
Potassium: 3.5 mmol/L (ref 3.5–5.1)
Sodium: 139 mmol/L (ref 135–145)

## 2020-02-08 LAB — MAGNESIUM: Magnesium: 1.7 mg/dL (ref 1.7–2.4)

## 2020-02-08 NOTE — Progress Notes (Signed)
Pulmonary Critical Care Medicine [place]   PULMONARY CRITICAL CARE SERVICE  PROGRESS NOTE  Date of Service: 02/08/2020  Shane Banks  MHD:[id number]  DOB: 1946/08/12   DOA: 11/21/2019  Referring Physician: [name], MD  HPI: Shane Banks is a 74 y.o. male seen for follow up of Acute on Chronic Respiratory Failure.  Patient is resting comfortably right now without distress has been on T collar currently is on 28% FiO2 patient is ready for a trach change  Medications: Reviewed on Rounds  Physical Exam:  Vitals: Temperature is 99.0 pulse 95 respiratory 14 blood pressure is 134/73 saturations 97%  Ventilator Settings on T collar FiO2 28%  . General: Comfortable at this time . Eyes: Grossly normal lids, irises & conjunctiva . ENT: grossly tongue is normal . Neck: no obvious mass . Cardiovascular: S1 S2 normal no gallop . Respiratory: No rhonchi no rales are noted at this time . Abdomen: soft . Skin: no rash seen on limited exam . Musculoskeletal: not rigid . Psychiatric:unable to assess . Neurologic: no seizure no involuntary movements         Lab Data:   Basic Metabolic Panel: Recent Labs  Lab 02/02/20 0553 02/05/20 0639 02/07/20 0730 02/08/20 0759  NA 139 141 142 139  K 3.6 3.4* 3.2* 3.5  CL 102 103 102 100  CO2 29 28 29 28   GLUCOSE 124* 125* 127* 128*  BUN 73* 114* 116* 64*  CREATININE 1.58* 2.02* 1.88* 1.30*  CALCIUM 9.8 10.5* 11.2* 10.1  MG  --   --   --  1.7  PHOS 1.5* 2.2* 1.3* <1.0*    ABG: No results for input(s): PHART, PCO2ART, PO2ART, HCO3, O2SAT in the last 168 hours.  Liver Function Tests: Recent Labs  Lab 02/02/20 0553 02/05/20 0639 02/07/20 0730 02/08/20 0759  ALBUMIN 2.9* 2.8* 2.6* 2.9*   No results for input(s): LIPASE, AMYLASE in the last 168 hours. No results for input(s): AMMONIA in the last 168 hours.  CBC: Recent Labs  Lab 02/02/20 0553 02/05/20 0639 02/06/20 0609 02/07/20 0730  WBC 13.6* 15.1*  15.3* 15.7*  HGB 8.8* 8.7* 8.3* 8.7*  HCT 27.8* 27.7* 26.4* 27.8*  MCV 85.3 85.5 84.9 84.5  PLT 259 246 210 197    Cardiac Enzymes: No results for input(s): CKTOTAL, CKMB, CKMBINDEX, TROPONINI in the last 168 hours.  BNP (last 3 results) No results for input(s): BNP in the last 8760 hours.  ProBNP (last 3 results) No results for input(s): PROBNP in the last 8760 hours.  Radiological Exams: No results found.  Assessment/Plan Active Problems:   Acute on chronic respiratory failure with hypoxia (HCC)   COVID-19 virus infection   Atrial fibrillation with RVR (HCC)   Severe sepsis (HCC)   Pneumonia due to COVID-19 virus   1. Acute on chronic respiratory failure hypoxia we will continue with T collar trials also will proceed to changing the tracheostomy out to #6 cuffless trach 2. COVID-19 virus infection resolved we will continue with supportive care 3. Atrial fibrillation rate controlled 4. Severe sepsis resolved 5. Pneumonia due to COVID-19 treated clinically improving   I have personally seen and evaluated the patient, evaluated laboratory and imaging results, formulated the assessment and plan and placed orders. The Patient requires high complexity decision making with multiple systems involvement.  Rounds were done with the Respiratory Therapy Director and Staff therapists and discussed with nursing staff also.  Allyne Gee, MD [facility] Pulmonary Critical Care Medicine Sleep Medicine

## 2020-02-09 DIAGNOSIS — I4891 Unspecified atrial fibrillation: Secondary | ICD-10-CM | POA: Diagnosis not present

## 2020-02-09 DIAGNOSIS — J9621 Acute and chronic respiratory failure with hypoxia: Secondary | ICD-10-CM | POA: Diagnosis not present

## 2020-02-09 DIAGNOSIS — U071 COVID-19: Secondary | ICD-10-CM | POA: Diagnosis not present

## 2020-02-09 DIAGNOSIS — N179 Acute kidney failure, unspecified: Secondary | ICD-10-CM | POA: Diagnosis not present

## 2020-02-09 LAB — CBC
HCT: 24.9 % — ABNORMAL LOW (ref 39.0–52.0)
Hemoglobin: 7.8 g/dL — ABNORMAL LOW (ref 13.0–17.0)
MCH: 26.4 pg (ref 26.0–34.0)
MCHC: 31.3 g/dL (ref 30.0–36.0)
MCV: 84.1 fL (ref 80.0–100.0)
Platelets: 169 K/uL (ref 150–400)
RBC: 2.96 MIL/uL — ABNORMAL LOW (ref 4.22–5.81)
RDW: 17.9 % — ABNORMAL HIGH (ref 11.5–15.5)
WBC: 15.3 K/uL — ABNORMAL HIGH (ref 4.0–10.5)
nRBC: 0 % (ref 0.0–0.2)

## 2020-02-09 LAB — RENAL FUNCTION PANEL
Albumin: 3.2 g/dL — ABNORMAL LOW (ref 3.5–5.0)
Anion gap: 15 (ref 5–15)
BUN: 68 mg/dL — ABNORMAL HIGH (ref 8–23)
CO2: 24 mmol/L (ref 22–32)
Calcium: 10 mg/dL (ref 8.9–10.3)
Chloride: 99 mmol/L (ref 98–111)
Creatinine, Ser: 1.25 mg/dL — ABNORMAL HIGH (ref 0.61–1.24)
GFR calc Af Amer: >60 mL/min (ref >60)
GFR calc non Af Amer: 57 mL/min — ABNORMAL LOW (ref >60)
Glucose, Bld: 102 mg/dL — ABNORMAL HIGH (ref 70–99)
Phosphorus: 2.1 mg/dL — ABNORMAL LOW (ref 2.5–4.6)
Potassium: 3.6 mmol/L (ref 3.5–5.1)
Sodium: 138 mmol/L (ref 135–145)

## 2020-02-09 LAB — BLOOD GAS, ARTERIAL
Acid-Base Excess: 4.1 mmol/L — ABNORMAL HIGH (ref 0.0–2.0)
Bicarbonate: 27.4 mmol/L (ref 20.0–28.0)
Drawn by: 243969
FIO2: 28
O2 Saturation: 96.1 %
Patient temperature: 37.4
pCO2 arterial: 35.5 mmHg (ref 32.0–48.0)
pH, Arterial: 7.499 — ABNORMAL HIGH (ref 7.350–7.450)
pO2, Arterial: 64.9 mmHg — ABNORMAL LOW (ref 83.0–108.0)

## 2020-02-09 NOTE — Progress Notes (Addendum)
Pulmonary Critical Care Medicine [place]   PULMONARY CRITICAL CARE SERVICE  PROGRESS NOTE  Date of Service: 02/09/2020  Shane Banks  JOI:[id number]  DOB: 1945/10/13   DOA: 11/21/2019  Referring Physician: [name], MD  HPI: Shane Banks is a 74 y.o. male seen for follow up of Acute on Chronic Respiratory Failure. Patient remains on 28% TBAR, sating well with no acute distress.   Medications: Reviewed on Rounds  Physical Exam:  Vitals: pulse 72, resp 35, bp 113/53, o2 97%, temp 98.7  Ventilator Settings 28% Tbar  . General: Comfortable at this time . Eyes: Grossly normal lids, irises & conjunctiva . ENT: grossly tongue is normal . Neck: no obvious mass . Cardiovascular: S1 S2 normal no gallop . Respiratory: no rales or ronchi noted . Abdomen: soft . Skin: no rash seen on limited exam . Musculoskeletal: not rigid . Psychiatric:unable to assess . Neurologic: no seizure no involuntary movements         Lab Data:   Basic Metabolic Panel: Recent Labs  Lab 02/05/20 0639 02/07/20 0730 02/08/20 0759 02/09/20 0500  NA 141 142 139 138  K 3.4* 3.2* 3.5 3.6  CL 103 102 100 99  CO2 28 29 28 24   GLUCOSE 125* 127* 128* 102*  BUN 114* 116* 64* 68*  CREATININE 2.02* 1.88* 1.30* 1.25*  CALCIUM 10.5* 11.2* 10.1 10.0  MG  --   --  1.7  --   PHOS 2.2* 1.3* <1.0* 2.1*    ABG: No results for input(s): PHART, PCO2ART, PO2ART, HCO3, O2SAT in the last 168 hours.  Liver Function Tests: Recent Labs  Lab 02/05/20 0639 02/07/20 0730 02/08/20 0759 02/09/20 0500  ALBUMIN 2.8* 2.6* 2.9* 3.2*   No results for input(s): LIPASE, AMYLASE in the last 168 hours. No results for input(s): AMMONIA in the last 168 hours.  CBC: Recent Labs  Lab 02/05/20 0639 02/06/20 0609 02/07/20 0730 02/09/20 0500  WBC 15.1* 15.3* 15.7* 15.3*  HGB 8.7* 8.3* 8.7* 7.8*  HCT 27.7* 26.4* 27.8* 24.9*  MCV 85.5 84.9 84.5 84.1  PLT 246 210 197 169    Cardiac Enzymes: No  results for input(s): CKTOTAL, CKMB, CKMBINDEX, TROPONINI in the last 168 hours.  BNP (last 3 results) No results for input(s): BNP in the last 8760 hours.  ProBNP (last 3 results) No results for input(s): PROBNP in the last 8760 hours.  Radiological Exams: No results found.  Assessment/Plan Active Problems:   Acute on chronic respiratory failure with hypoxia (HCC)   COVID-19 virus infection   Atrial fibrillation with RVR (HCC)   Severe sepsis (HCC)   Pneumonia due to COVID-19 virus   1. Acute on chronic respiratory failure hypoxia we will continue with T collar trials continue pulmonary toilet.  2. COVID-19 virus infection resolved we will continue with supportive care 3. Atrial fibrillation rate controlled 4. Severe sepsis resolved 5. Pneumonia due to COVID-19 treated clinically improving   I have personally seen and evaluated the patient, evaluated laboratory and imaging results, formulated the assessment and plan and placed orders. The Patient requires high complexity decision making with multiple systems involvement.  Rounds were done with the Respiratory Therapy Director and Staff therapists and discussed with nursing staff also.  Allyne Gee, MD [facility] Pulmonary Critical Care Medicine Sleep Medicine

## 2020-02-09 NOTE — Progress Notes (Signed)
[facility]  ROUNDING NOTE   Subjective:  Patient completed dialysis today. Tolerated well. Ultrafiltration achieved was 2 kg.  Objective:  Vital signs in last 24 hours:  Temperature 98.5 pulse 73 respirations 40 blood pressure 138/65  Physical Exam: General: Critically ill-appearing  Head: Normocephalic, atraumatic. Moist oral mucosal membranes  Eyes: Anicteric  Neck: Tracheostomy in place  Lungs:  Scattered rhonchi, normal effort  Heart: S1S2 no rubs  Abdomen:  Soft, nontender, bowel sounds present, PEG tube in place  Extremities: 1+ peripheral edema  Neurologic: Awake, alert  Skin: No rash  Access: Right IJ temporary dialysis catheter    Basic Metabolic Panel: Recent Labs  Lab 02/05/20 0639 02/05/20 0639 02/07/20 0730 02/08/20 0759 02/09/20 0500  NA 141  --  142 139 138  K 3.4*  --  3.2* 3.5 3.6  CL 103  --  102 100 99  CO2 28  --  29 28 24   GLUCOSE 125*  --  127* 128* 102*  BUN 114*  --  116* 64* 68*  CREATININE 2.02*  --  1.88* 1.30* 1.25*  CALCIUM 10.5*   < > 11.2* 10.1 10.0  MG  --   --   --  1.7  --   PHOS 2.2*  --  1.3* <1.0* 2.1*   < > = values in this interval not displayed.    Liver Function Tests: Recent Labs  Lab 02/05/20 0639 02/07/20 0730 02/08/20 0759 02/09/20 0500  ALBUMIN 2.8* 2.6* 2.9* 3.2*   No results for input(s): LIPASE, AMYLASE in the last 168 hours. No results for input(s): AMMONIA in the last 168 hours.  CBC: Recent Labs  Lab 02/05/20 0639 02/06/20 0609 02/07/20 0730 02/09/20 0500  WBC 15.1* 15.3* 15.7* 15.3*  HGB 8.7* 8.3* 8.7* 7.8*  HCT 27.7* 26.4* 27.8* 24.9*  MCV 85.5 84.9 84.5 84.1  PLT 246 210 197 169    Cardiac Enzymes: No results for input(s): CKTOTAL, CKMB, CKMBINDEX, TROPONINI in the last 168 hours.  BNP: Invalid input(s): POCBNP  CBG: No results for input(s): GLUCAP in the last 168 hours.  Microbiology: Results for orders placed or performed during the hospital encounter of 11/21/19   Culture, Urine     Status: Abnormal   Collection Time: 11/22/19  1:25 PM   Specimen: Urine, Random  Result Value Ref Range Status   Specimen Description URINE, RANDOM  Final   Special Requests   Final    NONE Performed at [facility], [address], [place], [place] [address]    Culture 50,000 COLONIES/mL YEAST (A)  Final   Report Status 11/23/2019 FINAL  Final  Culture, blood (routine x 2)     Status: None   Collection Time: 11/22/19  1:40 PM   Specimen: BLOOD LEFT HAND  Result Value Ref Range Status   Specimen Description BLOOD LEFT HAND  Final   Special Requests   Final    BOTTLES DRAWN AEROBIC ONLY Blood Culture results may not be optimal due to an inadequate volume of blood received in culture bottles   Culture   Final    NO GROWTH 5 DAYS Performed at [facility], [place] [address], [place], [place] [address]    Report Status 11/27/2019 FINAL  Final  Culture, blood (routine x 2)     Status: None   Collection Time: 11/22/19  1:44 PM   Specimen: BLOOD LEFT HAND  Result Value Ref Range Status   Specimen Description BLOOD LEFT HAND  Final   Special Requests   Final    BOTTLES DRAWN AEROBIC ONLY Blood Culture results may not be optimal due to an inadequate volume of blood received in culture bottles   Culture   Final    NO GROWTH 5 DAYS Performed at [facility], [place] [address], [place], [place] [address]    Report Status 11/27/2019 FINAL  Final  Culture, respiratory (non-expectorated)     Status: None   Collection Time: 11/22/19  4:59 PM   Specimen: Tracheal Aspirate; Respiratory  Result Value Ref Range Status   Specimen Description TRACHEAL ASPIRATE  Final   Special Requests NONE  Final   Gram Stain   Final    ABUNDANT WBC PRESENT, PREDOMINANTLY PMN NO ORGANISMS SEEN    Culture   Final    NO GROWTH 2 DAYS Performed at [facility], [address] [place], [place] [address]    Report Status 11/24/2019 FINAL  Final  Culture, respiratory  (non-expectorated)     Status: None   Collection Time: 12/04/19  2:22 AM   Specimen: Tracheal Aspirate; Respiratory  Result Value Ref Range Status   Specimen Description TRACHEAL ASPIRATE  Final   Special Requests NONE  Final   Gram Stain   Final    ABUNDANT WBC PRESENT, PREDOMINANTLY PMN ABUNDANT GRAM NEGATIVE RODS Performed at [facility], [address], [place], [place] [address]    Culture   Final    MODERATE PSEUDOMONAS AERUGINOSA FEW ESCHERICHIA COLI Confirmed Extended Spectrum Beta-Lactamase Producer (ESBL).  In bloodstream infections from ESBL organisms, carbapenems are preferred over piperacillin/tazobactam. They are shown to have a lower risk of mortality.    Report Status 12/09/2019 FINAL  Final   Organism ID, Bacteria PSEUDOMONAS AERUGINOSA  Final   Organism ID, Bacteria ESCHERICHIA COLI  Final      Susceptibility   Escherichia coli - MIC*    AMPICILLIN >=32 RESISTANT Resistant     CEFAZOLIN >=64 RESISTANT Resistant     CEFEPIME 2 SENSITIVE Sensitive     CEFTAZIDIME RESISTANT Resistant     CEFTRIAXONE >=64 RESISTANT Resistant     CIPROFLOXACIN <=0.25 SENSITIVE Sensitive     GENTAMICIN >=16 RESISTANT Resistant     IMIPENEM <=0.25 SENSITIVE Sensitive     TRIMETH/SULFA >=320 RESISTANT Resistant     AMPICILLIN/SULBACTAM 16 INTERMEDIATE Intermediate     PIP/TAZO <=4 SENSITIVE Sensitive     * FEW ESCHERICHIA COLI   Pseudomonas aeruginosa - MIC*    CEFTAZIDIME 16 INTERMEDIATE Intermediate     CIPROFLOXACIN >=4 RESISTANT Resistant     GENTAMICIN 2 SENSITIVE Sensitive     IMIPENEM >=16 RESISTANT Resistant     * MODERATE PSEUDOMONAS AERUGINOSA  Culture, Urine     Status: Abnormal   Collection Time: 12/04/19  6:33 PM   Specimen: Urine, Random  Result Value Ref Range Status   Specimen Description URINE, RANDOM  Final   Special Requests   Final    NONE Performed at [facility], [address], [place], [place] [address]    Culture >=100,000 COLONIES/mL  PSEUDOMONAS AERUGINOSA (A)  Final   Report Status 12/06/2019 FINAL  Final   Organism ID, Bacteria PSEUDOMONAS AERUGINOSA (A)  Final      Susceptibility   Pseudomonas aeruginosa - MIC*    CEFTAZIDIME 4 SENSITIVE Sensitive     CIPROFLOXACIN >=4 RESISTANT Resistant     GENTAMICIN 4 SENSITIVE Sensitive     IMIPENEM 2 SENSITIVE Sensitive  PIP/TAZO 16 SENSITIVE Sensitive     * >=100,000 COLONIES/mL PSEUDOMONAS AERUGINOSA  Culture, blood (routine x 2)     Status: Abnormal   Collection Time: 12/11/19  9:39 AM   Specimen: BLOOD LEFT HAND  Result Value Ref Range Status   Specimen Description BLOOD LEFT HAND  Final   Special Requests   Final    BOTTLES DRAWN AEROBIC AND ANAEROBIC Blood Culture adequate volume   Culture  Setup Time   Final    GRAM POSITIVE COCCI IN CLUSTERS ANAEROBIC BOTTLE ONLY CRITICAL RESULT CALLED TO, READ BACK BY AND VERIFIED WITH: RN R [place] [id number] AT 12 BY CM    Culture (A)  Final    STAPHYLOCOCCUS SPECIES (COAGULASE NEGATIVE) THE SIGNIFICANCE OF ISOLATING THIS ORGANISM FROM A SINGLE SET OF BLOOD CULTURES WHEN MULTIPLE SETS ARE DRAWN IS UNCERTAIN. PLEASE NOTIFY THE MICROBIOLOGY DEPARTMENT WITHIN ONE WEEK IF SPECIATION AND SENSITIVITIES ARE REQUIRED. Performed at [facility], [place] [address] [place], [place] [address]    Report Status 12/14/2019 FINAL  Final  Culture, blood (routine x 2)     Status: None   Collection Time: 12/11/19  9:47 AM   Specimen: BLOOD  Result Value Ref Range Status   Specimen Description BLOOD RIGHT ANTECUBITAL  Final   Special Requests   Final    BOTTLES DRAWN AEROBIC AND ANAEROBIC Blood Culture adequate volume   Culture  Setup Time   Final    CORRECTED RESULTS NO ORGANISMS SEEN PREVIOUSLY REPORTED AS: GRAM POSITIVE COCCI IN CLUSTERS CORRECTED RESULTS CALLED TO: RN R CUMMINGS [id number] AT 920 AM BY CM    Culture   Final    NO GROWTH 5 DAYS Performed at [facility], [place] [address], [place], [place] [address]     Report Status 12/16/2019 FINAL  Final  Culture, respiratory (non-expectorated)     Status: None   Collection Time: 12/11/19 10:27 AM   Specimen: Tracheal Aspirate; Respiratory  Result Value Ref Range Status   Specimen Description TRACHEAL ASPIRATE  Final   Special Requests NONE  Final   Gram Stain NO WBC SEEN NO ORGANISMS SEEN   Final   Culture   Final    FEW PSEUDOMONAS AERUGINOSA FEW STENOTROPHOMONAS MALTOPHILIA SEE SEPARATE REPORT IN [facility] FOR DOS 12/11/19. Performed at [facility], [place] [address], [place], [place] [address]    Report Status 12/26/2019 FINAL  Final   Organism ID, Bacteria PSEUDOMONAS AERUGINOSA  Final   Organism ID, Bacteria STENOTROPHOMONAS MALTOPHILIA  Final      Susceptibility   Pseudomonas aeruginosa - MIC*    CEFTAZIDIME 16 INTERMEDIATE Intermediate     CIPROFLOXACIN >=4 RESISTANT Resistant     GENTAMICIN 2 SENSITIVE Sensitive     IMIPENEM >=16 RESISTANT Resistant     * FEW PSEUDOMONAS AERUGINOSA   Stenotrophomonas maltophilia - MIC*    LEVOFLOXACIN 0.5 SENSITIVE Sensitive     TRIMETH/SULFA <=20 SENSITIVE Sensitive     * FEW STENOTROPHOMONAS MALTOPHILIA  Culture, Urine     Status: Abnormal   Collection Time: 12/11/19 11:30 AM   Specimen: Urine, Random  Result Value Ref Range Status   Specimen Description URINE, RANDOM  Final   Special Requests NONE  Final   Culture (A)  Final    <10,000 COLONIES/mL INSIGNIFICANT GROWTH Performed at [facility], [place] [address] [place], [place] [address]    Report Status 12/12/2019 FINAL  Final  Culture, blood (routine x 2)  Status: None   Collection Time: 12/15/19 12:37 PM   Specimen: BLOOD LEFT HAND  Result Value Ref Range Status   Specimen Description BLOOD LEFT HAND  Final   Special Requests   Final    BOTTLES DRAWN AEROBIC AND ANAEROBIC Blood Culture adequate volume   Culture   Final    NO GROWTH 5 DAYS Performed at [facility], [address] [place], [place] [address]    Report  Status 12/20/2019 FINAL  Final  Culture, blood (routine x 2)     Status: None   Collection Time: 12/15/19 12:42 PM   Specimen: BLOOD LEFT HAND  Result Value Ref Range Status   Specimen Description BLOOD LEFT HAND  Final   Special Requests   Final    BOTTLES DRAWN AEROBIC AND ANAEROBIC Blood Culture adequate volume   Culture   Final    NO GROWTH 5 DAYS Performed at [facility], [place] [address], [place], [place] [address]    Report Status 12/20/2019 FINAL  Final  C Difficile Quick Screen w PCR reflex     Status: None   Collection Time: 12/17/19  7:00 PM  Result Value Ref Range Status   C Diff antigen NEGATIVE NEGATIVE Final   C Diff toxin NEGATIVE NEGATIVE Final   C Diff interpretation No C. difficile detected.  Final    Comment: Performed at [facility], [place] [address] [place], [place] [address]  SARS CORONAVIRUS 2 (TAT 6-24 HRS) Nasopharyngeal Nasopharyngeal Swab     Status: None   Collection Time: 12/20/19 11:45 AM   Specimen: Nasopharyngeal Swab  Result Value Ref Range Status   SARS Coronavirus 2 NEGATIVE NEGATIVE Final    Comment: (NOTE) SARS-CoV-2 target nucleic acids are NOT DETECTED. The SARS-CoV-2 RNA is generally detectable in upper and lower respiratory specimens during the acute phase of infection. Negative results do not preclude SARS-CoV-2 infection, do not rule out co-infections with other pathogens, and should not be used as the sole basis for treatment or other patient management decisions. Negative results must be combined with clinical observations, patient history, and epidemiological information. The expected result is Negative. Fact Sheet for Patients: SugarRoll.be Fact Sheet for Healthcare Providers: https://www.woods-mathews.com/ This test is not yet approved or cleared by the [place] FDA and  has been authorized for detection and/or diagnosis of SARS-CoV-2 by FDA under an Emergency Use  Authorization (EUA). This EUA will remain  in effect (meaning this test can be used) for the duration of the COVID-19 declaration under Section 56 4(b)(1) of the Act, 21 U.S.C. section 360bbb-3(b)(1), unless the authorization is terminated or revoked sooner. Performed at [facility], [place] [address] [place], [place] [address]   Body fluid culture     Status: None   Collection Time: 12/21/19 12:10 PM   Specimen: Lung, Left; Pleural Fluid  Result Value Ref Range Status   Specimen Description PLEURAL FLUID  Final   Special Requests LEFT LUNG THORA  Final   Gram Stain   Final    RARE WBC PRESENT, PREDOMINANTLY MONONUCLEAR NO ORGANISMS SEEN    Culture   Final    NO GROWTH 3 DAYS Performed at [facility], [place] [address], [place], [place] [address]    Report Status 12/24/2019 FINAL  Final  Culture, blood (routine x 2)     Status: None   Collection Time: 12/25/19  2:16 PM   Specimen: BLOOD RIGHT HAND  Result Value Ref  Range Status   Specimen Description BLOOD RIGHT HAND  Final   Special Requests   Final    BOTTLES DRAWN AEROBIC ONLY Blood Culture adequate volume   Culture   Final    NO GROWTH 5 DAYS Performed at [facility], [address], [place], [place] [address]    Report Status 01/01/2020 FINAL  Final  Culture, blood (routine x 2)     Status: None   Collection Time: 12/25/19  2:18 PM   Specimen: BLOOD LEFT HAND  Result Value Ref Range Status   Specimen Description BLOOD LEFT HAND  Final   Special Requests   Final    BOTTLES DRAWN AEROBIC ONLY Blood Culture adequate volume   Culture   Final    NO GROWTH 5 DAYS Performed at [facility], [place] [address], [place], [place] [address]    Report Status 01/01/2020 FINAL  Final  Culture, respiratory (non-expectorated)     Status: None   Collection Time: 12/25/19  4:20 PM   Specimen: Tracheal Aspirate; Respiratory  Result Value Ref Range Status   Specimen Description TRACHEAL ASPIRATE  Final   Special  Requests NONE  Final   Gram Stain   Final    ABUNDANT WBC PRESENT, PREDOMINANTLY PMN NO ORGANISMS SEEN    Culture   Final    RARE Consistent with normal respiratory flora. Performed at [facility], [place] [address], [place], [place] [address]    Report Status 12/28/2019 FINAL  Final  Culture, respiratory (non-expectorated)     Status: None   Collection Time: 01/12/20  4:14 PM   Specimen: Tracheal Aspirate; Respiratory  Result Value Ref Range Status   Specimen Description TRACHEAL ASPIRATE  Final   Special Requests NONE  Final   Gram Stain   Final    ABUNDANT WBC PRESENT, PREDOMINANTLY PMN ABUNDANT GRAM NEGATIVE RODS RARE GRAM POSITIVE RODS Performed at [facility], [place] [address], [place], [place] [address]    Culture   Final    ABUNDANT PSEUDOMONAS AERUGINOSA FEW STENOTROPHOMONAS MALTOPHILIA    Report Status 01/16/2020 FINAL  Final   Organism ID, Bacteria PSEUDOMONAS AERUGINOSA  Final   Organism ID, Bacteria STENOTROPHOMONAS MALTOPHILIA  Final      Susceptibility   Pseudomonas aeruginosa - MIC*    CEFTAZIDIME 16 INTERMEDIATE Intermediate     CIPROFLOXACIN >=4 RESISTANT Resistant     GENTAMICIN 2 SENSITIVE Sensitive     IMIPENEM 2 SENSITIVE Sensitive     * ABUNDANT PSEUDOMONAS AERUGINOSA   Stenotrophomonas maltophilia - MIC*    LEVOFLOXACIN 0.5 SENSITIVE Sensitive     TRIMETH/SULFA <=20 SENSITIVE Sensitive     * FEW STENOTROPHOMONAS MALTOPHILIA  Culture, blood (Routine X 2) w Reflex to ID Panel     Status: None   Collection Time: 01/16/20  3:30 PM   Specimen: BLOOD LEFT HAND  Result Value Ref Range Status   Specimen Description BLOOD LEFT HAND  Final   Special Requests   Final    BOTTLES DRAWN AEROBIC AND ANAEROBIC Blood Culture adequate volume   Culture   Final    NO GROWTH 5 DAYS Performed at [facility], [address] [place],  [address]    Report Status 01/21/2020 FINAL  Final  Culture, blood (Routine X 2) w Reflex to ID Panel      Status: None   Collection Time: 01/16/20  3:32 PM   Specimen: BLOOD LEFT ARM  Result Value Ref  Range Status   Specimen Description BLOOD LEFT ARM  Final   Special Requests   Final    BOTTLES DRAWN AEROBIC AND ANAEROBIC Blood Culture adequate volume   Culture   Final    NO GROWTH 5 DAYS Performed at [facility], [address] [place], [place] [address]    Report Status 01/21/2020 FINAL  Final  Culture, respiratory (non-expectorated)     Status: None   Collection Time: 02/02/20  6:30 PM   Specimen: Tracheal Aspirate; Respiratory  Result Value Ref Range Status   Specimen Description TRACHEAL ASPIRATE  Final   Special Requests NONE  Final   Gram Stain   Final    ABUNDANT WBC PRESENT, PREDOMINANTLY PMN ABUNDANT GRAM NEGATIVE RODS Performed at [facility], [address], [place], [place] [address]    Culture ABUNDANT PSEUDOMONAS AERUGINOSA  Final   Report Status 02/05/2020 FINAL  Final   Organism ID, Bacteria PSEUDOMONAS AERUGINOSA  Final      Susceptibility   Pseudomonas aeruginosa - MIC*    CEFTAZIDIME 16 INTERMEDIATE Intermediate     CIPROFLOXACIN >=4 RESISTANT Resistant     GENTAMICIN 4 SENSITIVE Sensitive     IMIPENEM >=16 RESISTANT Resistant     CEFEPIME INTERMEDIATE Intermediate     * ABUNDANT PSEUDOMONAS AERUGINOSA    Coagulation Studies: No results for input(s): LABPROT, INR in the last 72 hours.  Urinalysis: No results for input(s): COLORURINE, LABSPEC, PHURINE, GLUCOSEU, HGBUR, BILIRUBINUR, KETONESUR, PROTEINUR, UROBILINOGEN, NITRITE, LEUKOCYTESUR in the last 72 hours.  Invalid input(s): APPERANCEUR    Imaging: No results found.   Medications:     heparin sodium (porcine), lidocaine, lidocaine  Assessment/ Plan:  74 y.o. male with a PMHx of acute respiratory failure status post COVID-19 infection status post tracheostomy placement, atrial fibrillation, hypertension, CVA, gout, hypertrophic cardiomyopathy, who was admitted to [facility] on 11/21/2019 for ongoing treatment of acute respiratory failure.  1.  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   -Patient completed dialysis today.  Ultrafiltration achieved with 2 kg.  Next dialysis on Monday.  2.  Acute respiratory failure secondary to COVID-19 infection and its sequela.  -Patient on T-piece.  Breathing comfortably.  3.  Hyperkalemia.  Resolved.  4.  Hyponatremia.  Serum sodium 138.  Recheck on Monday.  5.  Anemia of chronic kidney disease.  Hemoglobin did drop down a bit to 7.8.  Recheck hemoglobin on Monday.       LOS: 0 Jyl [place] 5/14/20212:05 PM

## 2020-02-10 DIAGNOSIS — N179 Acute kidney failure, unspecified: Secondary | ICD-10-CM | POA: Diagnosis not present

## 2020-02-10 DIAGNOSIS — I4891 Unspecified atrial fibrillation: Secondary | ICD-10-CM | POA: Diagnosis not present

## 2020-02-10 DIAGNOSIS — J9621 Acute and chronic respiratory failure with hypoxia: Secondary | ICD-10-CM | POA: Diagnosis not present

## 2020-02-10 DIAGNOSIS — U071 COVID-19: Secondary | ICD-10-CM | POA: Diagnosis not present

## 2020-02-10 NOTE — Progress Notes (Addendum)
Pulmonary Critical Care Medicine [place]   PULMONARY CRITICAL CARE SERVICE  PROGRESS NOTE  Date of Service: 02/10/2020  Shane Banks  TDV:[id number]  DOB: 1946-05-13   DOA: 11/21/2019  Referring Physician: [name], MD  HPI: Shane Banks is a 74 y.o. male seen for follow up of Acute on Chronic Respiratory Failure. Patient remains on aerosol trach collar 20% FiO2 satting well no fever or distress.  Medications: Reviewed on Rounds  Physical Exam:  Vitals: Pulse 77 respirations 39 BP 129/69 O2 sat 98% temp 98.7  Ventilator Settings 28% ATC  . General: Comfortable at this time . Eyes: Grossly normal lids, irises & conjunctiva . ENT: grossly tongue is normal . Neck: no obvious mass . Cardiovascular: S1 S2 normal no gallop . Respiratory: No rales or rhonchi noted . Abdomen: soft . Skin: no rash seen on limited exam . Musculoskeletal: not rigid . Psychiatric:unable to assess . Neurologic: no seizure no involuntary movements         Lab Data:   Basic Metabolic Panel: Recent Labs  Lab 02/05/20 0639 02/07/20 0730 02/08/20 0759 02/09/20 0500  NA 141 142 139 138  K 3.4* 3.2* 3.5 3.6  CL 103 102 100 99  CO2 28 29 28 24   GLUCOSE 125* 127* 128* 102*  BUN 114* 116* 64* 68*  CREATININE 2.02* 1.88* 1.30* 1.25*  CALCIUM 10.5* 11.2* 10.1 10.0  MG  --   --  1.7  --   PHOS 2.2* 1.3* <1.0* 2.1*    ABG: Recent Labs  Lab 02/09/20 2143  PHART 7.499*  PCO2ART 35.5  PO2ART 64.9*  HCO3 27.4  O2SAT 96.1    Liver Function Tests: Recent Labs  Lab 02/05/20 0639 02/07/20 0730 02/08/20 0759 02/09/20 0500  ALBUMIN 2.8* 2.6* 2.9* 3.2*   No results for input(s): LIPASE, AMYLASE in the last 168 hours. No results for input(s): AMMONIA in the last 168 hours.  CBC: Recent Labs  Lab 02/05/20 0639 02/06/20 0609 02/07/20 0730 02/09/20 0500  WBC 15.1* 15.3* 15.7* 15.3*  HGB 8.7* 8.3* 8.7* 7.8*  HCT 27.7* 26.4* 27.8* 24.9*  MCV 85.5 84.9 84.5 84.1   PLT 246 210 197 169    Cardiac Enzymes: No results for input(s): CKTOTAL, CKMB, CKMBINDEX, TROPONINI in the last 168 hours.  BNP (last 3 results) No results for input(s): BNP in the last 8760 hours.  ProBNP (last 3 results) No results for input(s): PROBNP in the last 8760 hours.  Radiological Exams: No results found.  Assessment/Plan Active Problems:   Acute on chronic respiratory failure with hypoxia (HCC)   COVID-19 virus infection   Atrial fibrillation with RVR (HCC)   Severe sepsis (HCC)   Pneumonia due to COVID-19 virus   1. Acute on chronic respiratory failure hypoxia we will continue with T collar trials continue pulmonary toilet.  2. COVID-19 virus infection resolved we will continue with supportive care 3. Atrial fibrillation rate controlled 4. Severe sepsis resolved 5. Pneumonia due to COVID-19 treated clinically improving   I have personally seen and evaluated the patient, evaluated laboratory and imaging results, formulated the assessment and plan and placed orders. The Patient requires high complexity decision making with multiple systems involvement.  Rounds were done with the Respiratory Therapy Director and Staff therapists and discussed with nursing staff also.  Allyne Gee, MD [facility] Pulmonary Critical Care Medicine Sleep Medicine

## 2020-02-11 DIAGNOSIS — I4891 Unspecified atrial fibrillation: Secondary | ICD-10-CM | POA: Diagnosis not present

## 2020-02-11 DIAGNOSIS — N179 Acute kidney failure, unspecified: Secondary | ICD-10-CM | POA: Diagnosis not present

## 2020-02-11 DIAGNOSIS — J9621 Acute and chronic respiratory failure with hypoxia: Secondary | ICD-10-CM | POA: Diagnosis not present

## 2020-02-11 DIAGNOSIS — U071 COVID-19: Secondary | ICD-10-CM | POA: Diagnosis not present

## 2020-02-11 NOTE — Progress Notes (Addendum)
Pulmonary Critical Care Medicine [place]   PULMONARY CRITICAL CARE SERVICE  PROGRESS NOTE  Date of Service: 02/11/2020  Shane Banks  KKX:[id number]  DOB: May 14, 1946   DOA: 11/21/2019  Referring Physician: [name], MD  HPI: Shane Banks is a 74 y.o. male seen for follow up of Acute on Chronic Respiratory Failure. Patient continues on 20% aerosol trach collar satting well with no fever or distress noted  Medications: Reviewed on Rounds  Physical Exam:  Vitals: Pulse 94 respirations 22 BP 134/55 O2 sat 100% temp 97.6  Ventilator Settings ATC 28%  . General: Comfortable at this time . Eyes: Grossly normal lids, irises & conjunctiva . ENT: grossly tongue is normal . Neck: no obvious mass . Cardiovascular: S1 S2 normal no gallop . Respiratory: No rales or rhonchi noted . Abdomen: soft . Skin: no rash seen on limited exam . Musculoskeletal: not rigid . Psychiatric:unable to assess . Neurologic: no seizure no involuntary movements         Lab Data:   Basic Metabolic Panel: Recent Labs  Lab 02/05/20 0639 02/07/20 0730 02/08/20 0759 02/09/20 0500  NA 141 142 139 138  K 3.4* 3.2* 3.5 3.6  CL 103 102 100 99  CO2 28 29 28 24   GLUCOSE 125* 127* 128* 102*  BUN 114* 116* 64* 68*  CREATININE 2.02* 1.88* 1.30* 1.25*  CALCIUM 10.5* 11.2* 10.1 10.0  MG  --   --  1.7  --   PHOS 2.2* 1.3* <1.0* 2.1*    ABG: Recent Labs  Lab 02/09/20 2143  PHART 7.499*  PCO2ART 35.5  PO2ART 64.9*  HCO3 27.4  O2SAT 96.1    Liver Function Tests: Recent Labs  Lab 02/05/20 0639 02/07/20 0730 02/08/20 0759 02/09/20 0500  ALBUMIN 2.8* 2.6* 2.9* 3.2*   No results for input(s): LIPASE, AMYLASE in the last 168 hours. No results for input(s): AMMONIA in the last 168 hours.  CBC: Recent Labs  Lab 02/05/20 0639 02/06/20 0609 02/07/20 0730 02/09/20 0500  WBC 15.1* 15.3* 15.7* 15.3*  HGB 8.7* 8.3* 8.7* 7.8*  HCT 27.7* 26.4* 27.8* 24.9*  MCV 85.5 84.9  84.5 84.1  PLT 246 210 197 169    Cardiac Enzymes: No results for input(s): CKTOTAL, CKMB, CKMBINDEX, TROPONINI in the last 168 hours.  BNP (last 3 results) No results for input(s): BNP in the last 8760 hours.  ProBNP (last 3 results) No results for input(s): PROBNP in the last 8760 hours.  Radiological Exams: No results found.  Assessment/Plan Active Problems:   Acute on chronic respiratory failure with hypoxia (HCC)   COVID-19 virus infection   Atrial fibrillation with RVR (HCC)   Severe sepsis (HCC)   Pneumonia due to COVID-19 virus   1. Acute on chronic respiratory failure hypoxia we will continue with T collar trials continue pulmonary toilet.  2. COVID-19 virus infection resolved we will continue with supportive care 3. Atrial fibrillation rate controlled 4. Severe sepsis resolved 5. Pneumonia due to COVID-19 treated clinically improving   I have personally seen and evaluated the patient, evaluated laboratory and imaging results, formulated the assessment and plan and placed orders. The Patient requires high complexity decision making with multiple systems involvement.  Rounds were done with the Respiratory Therapy Director and Staff therapists and discussed with nursing staff also.  Allyne Gee, MD [facility] Pulmonary Critical Care Medicine Sleep Medicine

## 2020-02-12 DIAGNOSIS — U071 COVID-19: Secondary | ICD-10-CM | POA: Diagnosis not present

## 2020-02-12 DIAGNOSIS — N179 Acute kidney failure, unspecified: Secondary | ICD-10-CM | POA: Diagnosis not present

## 2020-02-12 DIAGNOSIS — I4891 Unspecified atrial fibrillation: Secondary | ICD-10-CM | POA: Diagnosis not present

## 2020-02-12 DIAGNOSIS — J9621 Acute and chronic respiratory failure with hypoxia: Secondary | ICD-10-CM | POA: Diagnosis not present

## 2020-02-12 LAB — RENAL FUNCTION PANEL
Albumin: 2.4 g/dL — ABNORMAL LOW (ref 3.5–5.0)
Anion gap: 14 (ref 5–15)
BUN: 161 mg/dL — ABNORMAL HIGH (ref 8–23)
CO2: 25 mmol/L (ref 22–32)
Calcium: 10.7 mg/dL — ABNORMAL HIGH (ref 8.9–10.3)
Chloride: 99 mmol/L (ref 98–111)
Creatinine, Ser: 2.68 mg/dL — ABNORMAL HIGH (ref 0.61–1.24)
GFR calc Af Amer: 26 mL/min — ABNORMAL LOW (ref >60)
GFR calc non Af Amer: 23 mL/min — ABNORMAL LOW (ref >60)
Glucose, Bld: 139 mg/dL — ABNORMAL HIGH (ref 70–99)
Phosphorus: 3.1 mg/dL (ref 2.5–4.6)
Potassium: 4 mmol/L (ref 3.5–5.1)
Sodium: 138 mmol/L (ref 135–145)

## 2020-02-12 LAB — CBC
HCT: 25.6 % — ABNORMAL LOW (ref 39.0–52.0)
Hemoglobin: 8.2 g/dL — ABNORMAL LOW (ref 13.0–17.0)
MCH: 27.1 pg (ref 26.0–34.0)
MCHC: 32 g/dL (ref 30.0–36.0)
MCV: 84.5 fL (ref 80.0–100.0)
Platelets: 163 10*3/uL (ref 150–400)
RBC: 3.03 MIL/uL — ABNORMAL LOW (ref 4.22–5.81)
RDW: 18.3 % — ABNORMAL HIGH (ref 11.5–15.5)
WBC: 17.5 10*3/uL — ABNORMAL HIGH (ref 4.0–10.5)
nRBC: 0 % (ref 0.0–0.2)

## 2020-02-12 NOTE — Progress Notes (Signed)
[facility]  ROUNDING NOTE   Subjective:  Patient seen and evaluated this AM. Currently sitting up in a chair.  Objective:  Vital signs in last 24 hours:  Temperature 98.2 pulse 72 respirations 33 blood pressure 134/69  Physical Exam: General: Critically ill-appearing  Head: Normocephalic, atraumatic. Moist oral mucosal membranes  Eyes: Anicteric  Neck: Tracheostomy in place  Lungs:  Scattered rhonchi, normal effort  Heart: S1S2 no rubs  Abdomen:  Soft, nontender, bowel sounds present, PEG tube in place  Extremities: 1+ peripheral edema  Neurologic: Awake, alert  Skin: No rash  Access: Right IJ temporary dialysis catheter    Basic Metabolic Panel: Recent Labs  Lab 02/07/20 0730 02/07/20 0730 02/08/20 0759 02/09/20 0500 02/12/20 0650  NA 142  --  139 138 138  K 3.2*  --  3.5 3.6 4.0  CL 102  --  100 99 99  CO2 29  --  28 24 25   GLUCOSE 127*  --  128* 102* 139*  BUN 116*  --  64* 68* 161*  CREATININE 1.88*  --  1.30* 1.25* 2.68*  CALCIUM 11.2*   < > 10.1 10.0 10.7*  MG  --   --  1.7  --   --   PHOS 1.3*  --  <1.0* 2.1* 3.1   < > = values in this interval not displayed.    Liver Function Tests: Recent Labs  Lab 02/07/20 0730 02/08/20 0759 02/09/20 0500 02/12/20 0650  ALBUMIN 2.6* 2.9* 3.2* 2.4*   No results for input(s): LIPASE, AMYLASE in the last 168 hours. No results for input(s): AMMONIA in the last 168 hours.  CBC: Recent Labs  Lab 02/06/20 0609 02/07/20 0730 02/09/20 0500 02/12/20 0650  WBC 15.3* 15.7* 15.3* 17.5*  HGB 8.3* 8.7* 7.8* 8.2*  HCT 26.4* 27.8* 24.9* 25.6*  MCV 84.9 84.5 84.1 84.5  PLT 210 197 169 163    Cardiac Enzymes: No results for input(s): CKTOTAL, CKMB, CKMBINDEX, TROPONINI in the last 168 hours.  BNP: Invalid input(s): POCBNP  CBG: No results for input(s): GLUCAP in the last 168 hours.  Microbiology: Results for orders placed or performed during the hospital encounter of 11/21/19  Culture, Urine      Status: Abnormal   Collection Time: 11/22/19  1:25 PM   Specimen: Urine, Random  Result Value Ref Range Status   Specimen Description URINE, RANDOM  Final   Special Requests   Final    NONE Performed at [facility], [address], [place], [place] [address]    Culture 50,000 COLONIES/mL YEAST (A)  Final   Report Status 11/23/2019 FINAL  Final  Culture, blood (routine x 2)     Status: None   Collection Time: 11/22/19  1:40 PM   Specimen: BLOOD LEFT HAND  Result Value Ref Range Status   Specimen Description BLOOD LEFT HAND  Final   Special Requests   Final    BOTTLES DRAWN AEROBIC ONLY Blood Culture results may not be optimal due to an inadequate volume of blood received in culture bottles   Culture   Final    NO GROWTH 5 DAYS Performed at [facility], [place] [address], [place], [place] [address]    Report Status 11/27/2019 FINAL  Final  Culture, blood (routine x 2)     Status: None   Collection Time: 11/22/19  1:44 PM   Specimen: BLOOD LEFT HAND  Result Value Ref Range Status   Specimen Description BLOOD LEFT  HAND  Final   Special Requests   Final    BOTTLES DRAWN AEROBIC ONLY Blood Culture results may not be optimal due to an inadequate volume of blood received in culture bottles   Culture   Final    NO GROWTH 5 DAYS Performed at [facility], [place] [address] [place], [place] [address]    Report Status 11/27/2019 FINAL  Final  Culture, respiratory (non-expectorated)     Status: None   Collection Time: 11/22/19  4:59 PM   Specimen: Tracheal Aspirate; Respiratory  Result Value Ref Range Status   Specimen Description TRACHEAL ASPIRATE  Final   Special Requests NONE  Final   Gram Stain   Final    ABUNDANT WBC PRESENT, PREDOMINANTLY PMN NO ORGANISMS SEEN    Culture   Final    NO GROWTH 2 DAYS Performed at [facility], [address], [place], [place] [address]    Report Status 11/24/2019 FINAL  Final  Culture, respiratory (non-expectorated)      Status: None   Collection Time: 12/04/19  2:22 AM   Specimen: Tracheal Aspirate; Respiratory  Result Value Ref Range Status   Specimen Description TRACHEAL ASPIRATE  Final   Special Requests NONE  Final   Gram Stain   Final    ABUNDANT WBC PRESENT, PREDOMINANTLY PMN ABUNDANT GRAM NEGATIVE RODS Performed at [facility], [address] [place], [place] [address]    Culture   Final    MODERATE PSEUDOMONAS AERUGINOSA FEW ESCHERICHIA COLI Confirmed Extended Spectrum Beta-Lactamase Producer (ESBL).  In bloodstream infections from ESBL organisms, carbapenems are preferred over piperacillin/tazobactam. They are shown to have a lower risk of mortality.    Report Status 12/09/2019 FINAL  Final   Organism ID, Bacteria PSEUDOMONAS AERUGINOSA  Final   Organism ID, Bacteria ESCHERICHIA COLI  Final      Susceptibility   Escherichia coli - MIC*    AMPICILLIN >=32 RESISTANT Resistant     CEFAZOLIN >=64 RESISTANT Resistant     CEFEPIME 2 SENSITIVE Sensitive     CEFTAZIDIME RESISTANT Resistant     CEFTRIAXONE >=64 RESISTANT Resistant     CIPROFLOXACIN <=0.25 SENSITIVE Sensitive     GENTAMICIN >=16 RESISTANT Resistant     IMIPENEM <=0.25 SENSITIVE Sensitive     TRIMETH/SULFA >=320 RESISTANT Resistant     AMPICILLIN/SULBACTAM 16 INTERMEDIATE Intermediate     PIP/TAZO <=4 SENSITIVE Sensitive     * FEW ESCHERICHIA COLI   Pseudomonas aeruginosa - MIC*    CEFTAZIDIME 16 INTERMEDIATE Intermediate     CIPROFLOXACIN >=4 RESISTANT Resistant     GENTAMICIN 2 SENSITIVE Sensitive     IMIPENEM >=16 RESISTANT Resistant     * MODERATE PSEUDOMONAS AERUGINOSA  Culture, Urine     Status: Abnormal   Collection Time: 12/04/19  6:33 PM   Specimen: Urine, Random  Result Value Ref Range Status   Specimen Description URINE, RANDOM  Final   Special Requests   Final    NONE Performed at [facility], [address] [place], [place] [address]    Culture >=100,000 COLONIES/mL PSEUDOMONAS AERUGINOSA  (A)  Final   Report Status 12/06/2019 FINAL  Final   Organism ID, Bacteria PSEUDOMONAS AERUGINOSA (A)  Final      Susceptibility   Pseudomonas aeruginosa - MIC*    CEFTAZIDIME 4 SENSITIVE Sensitive     CIPROFLOXACIN >=4 RESISTANT Resistant     GENTAMICIN 4 SENSITIVE Sensitive     IMIPENEM 2  SENSITIVE Sensitive     PIP/TAZO 16 SENSITIVE Sensitive     * >=100,000 COLONIES/mL PSEUDOMONAS AERUGINOSA  Culture, blood (routine x 2)     Status: Abnormal   Collection Time: 12/11/19  9:39 AM   Specimen: BLOOD LEFT HAND  Result Value Ref Range Status   Specimen Description BLOOD LEFT HAND  Final   Special Requests   Final    BOTTLES DRAWN AEROBIC AND ANAEROBIC Blood Culture adequate volume   Culture  Setup Time   Final    GRAM POSITIVE COCCI IN CLUSTERS ANAEROBIC BOTTLE ONLY CRITICAL RESULT CALLED TO, READ BACK BY AND VERIFIED WITH: RN R Ware [id number] AT 25 BY CM    Culture (A)  Final    STAPHYLOCOCCUS SPECIES (COAGULASE NEGATIVE) THE SIGNIFICANCE OF ISOLATING THIS ORGANISM FROM A SINGLE SET OF BLOOD CULTURES WHEN MULTIPLE SETS ARE DRAWN IS UNCERTAIN. PLEASE NOTIFY THE MICROBIOLOGY DEPARTMENT WITHIN ONE WEEK IF SPECIATION AND SENSITIVITIES ARE REQUIRED. Performed at [facility], [place] [address] [place], [place] [address]    Report Status 12/14/2019 FINAL  Final  Culture, blood (routine x 2)     Status: None   Collection Time: 12/11/19  9:47 AM   Specimen: BLOOD  Result Value Ref Range Status   Specimen Description BLOOD RIGHT ANTECUBITAL  Final   Special Requests   Final    BOTTLES DRAWN AEROBIC AND ANAEROBIC Blood Culture adequate volume   Culture  Setup Time   Final    CORRECTED RESULTS NO ORGANISMS SEEN PREVIOUSLY REPORTED AS: GRAM POSITIVE COCCI IN CLUSTERS CORRECTED RESULTS CALLED TO: RN R CUMMINGS [id number] AT 920 AM BY CM    Culture   Final    NO GROWTH 5 DAYS Performed at [facility], [place] [address], [place], [place] [address]    Report Status 12/16/2019  FINAL  Final  Culture, respiratory (non-expectorated)     Status: None   Collection Time: 12/11/19 10:27 AM   Specimen: Tracheal Aspirate; Respiratory  Result Value Ref Range Status   Specimen Description TRACHEAL ASPIRATE  Final   Special Requests NONE  Final   Gram Stain NO WBC SEEN NO ORGANISMS SEEN   Final   Culture   Final    FEW PSEUDOMONAS AERUGINOSA FEW STENOTROPHOMONAS MALTOPHILIA SEE SEPARATE REPORT IN [facility] FOR DOS 12/11/19. Performed at [facility], [place] [address], [place], [place] [address]    Report Status 12/26/2019 FINAL  Final   Organism ID, Bacteria PSEUDOMONAS AERUGINOSA  Final   Organism ID, Bacteria STENOTROPHOMONAS MALTOPHILIA  Final      Susceptibility   Pseudomonas aeruginosa - MIC*    CEFTAZIDIME 16 INTERMEDIATE Intermediate     CIPROFLOXACIN >=4 RESISTANT Resistant     GENTAMICIN 2 SENSITIVE Sensitive     IMIPENEM >=16 RESISTANT Resistant     * FEW PSEUDOMONAS AERUGINOSA   Stenotrophomonas maltophilia - MIC*    LEVOFLOXACIN 0.5 SENSITIVE Sensitive     TRIMETH/SULFA <=20 SENSITIVE Sensitive     * FEW STENOTROPHOMONAS MALTOPHILIA  Culture, Urine     Status: Abnormal   Collection Time: 12/11/19 11:30 AM   Specimen: Urine, Random  Result Value Ref Range Status   Specimen Description URINE, RANDOM  Final   Special Requests NONE  Final   Culture (A)  Final    <10,000 COLONIES/mL INSIGNIFICANT GROWTH Performed at [facility], [place] [address], [place], [place] [address]    Report Status 12/12/2019 FINAL  Final  Culture, blood (  routine x 2)     Status: None   Collection Time: 12/15/19 12:37 PM   Specimen: BLOOD LEFT HAND  Result Value Ref Range Status   Specimen Description BLOOD LEFT HAND  Final   Special Requests   Final    BOTTLES DRAWN AEROBIC AND ANAEROBIC Blood Culture adequate volume   Culture   Final    NO GROWTH 5 DAYS Performed at [facility], [address] [place], [place] [address]    Report Status 12/20/2019 FINAL   Final  Culture, blood (routine x 2)     Status: None   Collection Time: 12/15/19 12:42 PM   Specimen: BLOOD LEFT HAND  Result Value Ref Range Status   Specimen Description BLOOD LEFT HAND  Final   Special Requests   Final    BOTTLES DRAWN AEROBIC AND ANAEROBIC Blood Culture adequate volume   Culture   Final    NO GROWTH 5 DAYS Performed at [facility], [place] [address], [place], [place] [address]    Report Status 12/20/2019 FINAL  Final  C Difficile Quick Screen w PCR reflex     Status: None   Collection Time: 12/17/19  7:00 PM  Result Value Ref Range Status   C Diff antigen NEGATIVE NEGATIVE Final   C Diff toxin NEGATIVE NEGATIVE Final   C Diff interpretation No C. difficile detected.  Final    Comment: Performed at [facility], [place] [address] [place], [place] [address]  SARS CORONAVIRUS 2 (TAT 6-24 HRS) Nasopharyngeal Nasopharyngeal Swab     Status: None   Collection Time: 12/20/19 11:45 AM   Specimen: Nasopharyngeal Swab  Result Value Ref Range Status   SARS Coronavirus 2 NEGATIVE NEGATIVE Final    Comment: (NOTE) SARS-CoV-2 target nucleic acids are NOT DETECTED. The SARS-CoV-2 RNA is generally detectable in upper and lower respiratory specimens during the acute phase of infection. Negative results do not preclude SARS-CoV-2 infection, do not rule out co-infections with other pathogens, and should not be used as the sole basis for treatment or other patient management decisions. Negative results must be combined with clinical observations, patient history, and epidemiological information. The expected result is Negative. Fact Sheet for Patients: SugarRoll.be Fact Sheet for Healthcare Providers: https://www.woods-mathews.com/ This test is not yet approved or cleared by the [place] FDA and  has been authorized for detection and/or diagnosis of SARS-CoV-2 by FDA under an Emergency Use Authorization (EUA). This EUA  will remain  in effect (meaning this test can be used) for the duration of the COVID-19 declaration under Section 56 4(b)(1) of the Act, 21 U.S.C. section 360bbb-3(b)(1), unless the authorization is terminated or revoked sooner. Performed at [facility], [place] [address] [place], [place] [address]   Body fluid culture     Status: None   Collection Time: 12/21/19 12:10 PM   Specimen: Lung, Left; Pleural Fluid  Result Value Ref Range Status   Specimen Description PLEURAL FLUID  Final   Special Requests LEFT LUNG THORA  Final   Gram Stain   Final    RARE WBC PRESENT, PREDOMINANTLY MONONUCLEAR NO ORGANISMS SEEN    Culture   Final    NO GROWTH 3 DAYS Performed at [facility], [place] [address], [place], [place] [address]    Report Status 12/24/2019 FINAL  Final  Culture, blood (routine x 2)     Status: None   Collection Time: 12/25/19  2:16 PM   Specimen:  BLOOD RIGHT HAND  Result Value Ref Range Status   Specimen Description BLOOD RIGHT HAND  Final   Special Requests   Final    BOTTLES DRAWN AEROBIC ONLY Blood Culture adequate volume   Culture   Final    NO GROWTH 5 DAYS Performed at [facility], [address], [place], [place] [address]    Report Status 01/01/2020 FINAL  Final  Culture, blood (routine x 2)     Status: None   Collection Time: 12/25/19  2:18 PM   Specimen: BLOOD LEFT HAND  Result Value Ref Range Status   Specimen Description BLOOD LEFT HAND  Final   Special Requests   Final    BOTTLES DRAWN AEROBIC ONLY Blood Culture adequate volume   Culture   Final    NO GROWTH 5 DAYS Performed at [facility], [place] [address] [place], [place] [address]    Report Status 01/01/2020 FINAL  Final  Culture, respiratory (non-expectorated)     Status: None   Collection Time: 12/25/19  4:20 PM   Specimen: Tracheal Aspirate; Respiratory  Result Value Ref Range Status   Specimen Description TRACHEAL ASPIRATE  Final   Special Requests NONE  Final   Gram  Stain   Final    ABUNDANT WBC PRESENT, PREDOMINANTLY PMN NO ORGANISMS SEEN    Culture   Final    RARE Consistent with normal respiratory flora. Performed at [facility], [place] [address], [place], [place] [address]    Report Status 12/28/2019 FINAL  Final  Culture, respiratory (non-expectorated)     Status: None   Collection Time: 01/12/20  4:14 PM   Specimen: Tracheal Aspirate; Respiratory  Result Value Ref Range Status   Specimen Description TRACHEAL ASPIRATE  Final   Special Requests NONE  Final   Gram Stain   Final    ABUNDANT WBC PRESENT, PREDOMINANTLY PMN ABUNDANT GRAM NEGATIVE RODS RARE GRAM POSITIVE RODS Performed at [facility], [place] [address] [place], [place] [address]    Culture   Final    ABUNDANT PSEUDOMONAS AERUGINOSA FEW STENOTROPHOMONAS MALTOPHILIA    Report Status 01/16/2020 FINAL  Final   Organism ID, Bacteria PSEUDOMONAS AERUGINOSA  Final   Organism ID, Bacteria STENOTROPHOMONAS MALTOPHILIA  Final      Susceptibility   Pseudomonas aeruginosa - MIC*    CEFTAZIDIME 16 INTERMEDIATE Intermediate     CIPROFLOXACIN >=4 RESISTANT Resistant     GENTAMICIN 2 SENSITIVE Sensitive     IMIPENEM 2 SENSITIVE Sensitive     * ABUNDANT PSEUDOMONAS AERUGINOSA   Stenotrophomonas maltophilia - MIC*    LEVOFLOXACIN 0.5 SENSITIVE Sensitive     TRIMETH/SULFA <=20 SENSITIVE Sensitive     * FEW STENOTROPHOMONAS MALTOPHILIA  Culture, blood (Routine X 2) w Reflex to ID Panel     Status: None   Collection Time: 01/16/20  3:30 PM   Specimen: BLOOD LEFT HAND  Result Value Ref Range Status   Specimen Description BLOOD LEFT HAND  Final   Special Requests   Final    BOTTLES DRAWN AEROBIC AND ANAEROBIC Blood Culture adequate volume   Culture   Final    NO GROWTH 5 DAYS Performed at [facility], [address], [place],  [address]    Report Status 01/21/2020 FINAL  Final  Culture, blood (Routine X 2) w Reflex to ID Panel     Status: None   Collection  Time: 01/16/20  3:32 PM   Specimen:  BLOOD LEFT ARM  Result Value Ref Range Status   Specimen Description BLOOD LEFT ARM  Final   Special Requests   Final    BOTTLES DRAWN AEROBIC AND ANAEROBIC Blood Culture adequate volume   Culture   Final    NO GROWTH 5 DAYS Performed at [facility], [address] [place], [place] [address]    Report Status 01/21/2020 FINAL  Final  Culture, respiratory (non-expectorated)     Status: None   Collection Time: 02/02/20  6:30 PM   Specimen: Tracheal Aspirate; Respiratory  Result Value Ref Range Status   Specimen Description TRACHEAL ASPIRATE  Final   Special Requests NONE  Final   Gram Stain   Final    ABUNDANT WBC PRESENT, PREDOMINANTLY PMN ABUNDANT GRAM NEGATIVE RODS Performed at [facility], [address], [place], [place] [address]    Culture ABUNDANT PSEUDOMONAS AERUGINOSA  Final   Report Status 02/05/2020 FINAL  Final   Organism ID, Bacteria PSEUDOMONAS AERUGINOSA  Final      Susceptibility   Pseudomonas aeruginosa - MIC*    CEFTAZIDIME 16 INTERMEDIATE Intermediate     CIPROFLOXACIN >=4 RESISTANT Resistant     GENTAMICIN 4 SENSITIVE Sensitive     IMIPENEM >=16 RESISTANT Resistant     CEFEPIME INTERMEDIATE Intermediate     * ABUNDANT PSEUDOMONAS AERUGINOSA    Coagulation Studies: No results for input(s): LABPROT, INR in the last 72 hours.  Urinalysis: No results for input(s): COLORURINE, LABSPEC, PHURINE, GLUCOSEU, HGBUR, BILIRUBINUR, KETONESUR, PROTEINUR, UROBILINOGEN, NITRITE, LEUKOCYTESUR in the last 72 hours.  Invalid input(s): APPERANCEUR    Imaging: No results found.   Medications:     heparin sodium (porcine), lidocaine, lidocaine  Assessment/ Plan:  74 y.o. male with a PMHx of acute respiratory failure status post COVID-19 infection status post tracheostomy placement, atrial fibrillation, hypertension, CVA, gout, hypertrophic cardiomyopathy, who was admitted to [facility] on 11/21/2019  for ongoing treatment of acute respiratory failure.  1.  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   -Patient due for dialysis treatment today.  Continue on MWF schedule.  2.  Acute respiratory failure secondary to COVID-19 infection and its sequela.  -Remains off the ventilator at the moment.  Breathing comfortably via tracheostomy.  3.  Hyperkalemia.  Resolved.  4.  Hyponatremia.  Sodium remains normal at 138 today.  Continue to monitor.  5.  Anemia of chronic kidney disease.  Hemoglobin up a bit to 8.2.  Continue to monitor CBC.       LOS: 0 Laynie Espy 5/17/20218:19 AM

## 2020-02-12 NOTE — Progress Notes (Signed)
Pulmonary Critical Care Medicine [place]   PULMONARY CRITICAL CARE SERVICE  PROGRESS NOTE  Date of Service: 02/12/2020  Shane Banks  WNU:[id number]  DOB: Mar 11, 1946   DOA: 11/21/2019  Referring Physician: [name], MD  HPI: Shane Banks is a 74 y.o. male seen for follow up of Acute on Chronic Respiratory Failure.  Patient currently is on T collar has been on 28% FiO2 secretions remain copious treatment  Medications: Reviewed on Rounds  Physical Exam:  Vitals: Temperature is 98.2 pulse 73 respiratory 30 blood pressure is 139/69 saturations 100%  Ventilator Settings on T collar with an FiO2 28%  . General: Comfortable at this time . Eyes: Grossly normal lids, irises & conjunctiva . ENT: grossly tongue is normal . Neck: no obvious mass . Cardiovascular: S1 S2 normal no gallop . Respiratory: No rhonchi coarse breath sounds . Abdomen: soft . Skin: no rash seen on limited exam . Musculoskeletal: not rigid . Psychiatric:unable to assess . Neurologic: no seizure no involuntary movements         Lab Data:   Basic Metabolic Panel: Recent Labs  Lab 02/07/20 0730 02/08/20 0759 02/09/20 0500 02/12/20 0650  NA 142 139 138 138  K 3.2* 3.5 3.6 4.0  CL 102 100 99 99  CO2 29 28 24 25   GLUCOSE 127* 128* 102* 139*  BUN 116* 64* 68* 161*  CREATININE 1.88* 1.30* 1.25* 2.68*  CALCIUM 11.2* 10.1 10.0 10.7*  MG  --  1.7  --   --   PHOS 1.3* <1.0* 2.1* 3.1    ABG: Recent Labs  Lab 02/09/20 2143  PHART 7.499*  PCO2ART 35.5  PO2ART 64.9*  HCO3 27.4  O2SAT 96.1    Liver Function Tests: Recent Labs  Lab 02/07/20 0730 02/08/20 0759 02/09/20 0500 02/12/20 0650  ALBUMIN 2.6* 2.9* 3.2* 2.4*   No results for input(s): LIPASE, AMYLASE in the last 168 hours. No results for input(s): AMMONIA in the last 168 hours.  CBC: Recent Labs  Lab 02/06/20 0609 02/07/20 0730 02/09/20 0500 02/12/20 0650  WBC 15.3* 15.7* 15.3* 17.5*  HGB 8.3* 8.7* 7.8*  8.2*  HCT 26.4* 27.8* 24.9* 25.6*  MCV 84.9 84.5 84.1 84.5  PLT 210 197 169 163    Cardiac Enzymes: No results for input(s): CKTOTAL, CKMB, CKMBINDEX, TROPONINI in the last 168 hours.  BNP (last 3 results) No results for input(s): BNP in the last 8760 hours.  ProBNP (last 3 results) No results for input(s): PROBNP in the last 8760 hours.  Radiological Exams: No results found.  Assessment/Plan Active Problems:   Acute on chronic respiratory failure with hypoxia (HCC)   COVID-19 virus infection   Atrial fibrillation with RVR (HCC)   Severe sepsis (HCC)   Pneumonia due to COVID-19 virus   1. Acute on chronic respiratory failure with hypoxia we will continue with T collar trials secretions are still quite copious requiring frequent suctioning. 2. COVID-19 virus infection treated resolved 3. Atrial fibrillation rate controlled 4. Severe sepsis hemodynamics are stable resolved 5. Pneumonia due to COVID-19 treated clinically improving   I have personally seen and evaluated the patient, evaluated laboratory and imaging results, formulated the assessment and plan and placed orders. The Patient requires high complexity decision making with multiple systems involvement.  Rounds were done with the Respiratory Therapy Director and Staff therapists and discussed with nursing staff also.  Allyne Gee, MD [facility] Pulmonary Critical Care Medicine Sleep Medicine

## 2020-02-13 DIAGNOSIS — U071 COVID-19: Secondary | ICD-10-CM | POA: Diagnosis not present

## 2020-02-13 DIAGNOSIS — J9621 Acute and chronic respiratory failure with hypoxia: Secondary | ICD-10-CM | POA: Diagnosis not present

## 2020-02-13 DIAGNOSIS — N179 Acute kidney failure, unspecified: Secondary | ICD-10-CM | POA: Diagnosis not present

## 2020-02-13 DIAGNOSIS — I4891 Unspecified atrial fibrillation: Secondary | ICD-10-CM | POA: Diagnosis not present

## 2020-02-13 LAB — CBC
HCT: 26.7 % — ABNORMAL LOW (ref 39.0–52.0)
Hemoglobin: 8.6 g/dL — ABNORMAL LOW (ref 13.0–17.0)
MCH: 26.8 pg (ref 26.0–34.0)
MCHC: 32.2 g/dL (ref 30.0–36.0)
MCV: 83.2 fL (ref 80.0–100.0)
Platelets: 151 10*3/uL (ref 150–400)
RBC: 3.21 MIL/uL — ABNORMAL LOW (ref 4.22–5.81)
RDW: 18.2 % — ABNORMAL HIGH (ref 11.5–15.5)
WBC: 15.8 10*3/uL — ABNORMAL HIGH (ref 4.0–10.5)
nRBC: 0 % (ref 0.0–0.2)

## 2020-02-13 NOTE — Progress Notes (Signed)
Pulmonary Critical Care Medicine [place]   PULMONARY CRITICAL CARE SERVICE  PROGRESS NOTE  Date of Service: 02/13/2020  Shane Banks  GXQ:[id number]  DOB: [birth year]   DOA: 11/21/2019  Referring Physician: [name], MD  HPI: Shane Banks is a 74 y.o. male seen for follow up of Acute on Chronic Respiratory Failure.  Patient currently is on T collar has been on 30% FiO2 good saturations are noted at this time  Medications: Reviewed on Rounds  Physical Exam:  Vitals: Temperature 98.2 pulse 100 respiratory 36 blood pressure is 109/65 saturations 95%  Ventilator Settings off the ventilator on T collar currently  . General: Comfortable at this time . Eyes: Grossly normal lids, irises & conjunctiva . ENT: grossly tongue is normal . Neck: no obvious mass . Cardiovascular: S1 S2 normal no gallop . Respiratory: No rhonchi no rales noted at this time . Abdomen: soft . Skin: no rash seen on limited exam . Musculoskeletal: not rigid . Psychiatric:unable to assess . Neurologic: no seizure no involuntary movements         Lab Data:   Basic Metabolic Panel: Recent Labs  Lab 02/07/20 0730 02/08/20 0759 02/09/20 0500 02/12/20 0650  NA 142 139 138 138  K 3.2* 3.5 3.6 4.0  CL 102 100 99 99  CO2 29 28 24 25   GLUCOSE 127* 128* 102* 139*  BUN 116* 64* 68* 161*  CREATININE 1.88* 1.30* 1.25* 2.68*  CALCIUM 11.2* 10.1 10.0 10.7*  MG  --  1.7  --   --   PHOS 1.3* <1.0* 2.1* 3.1    ABG: Recent Labs  Lab 02/09/20 2143  PHART 7.499*  PCO2ART 35.5  PO2ART 64.9*  HCO3 27.4  O2SAT 96.1    Liver Function Tests: Recent Labs  Lab 02/07/20 0730 02/08/20 0759 02/09/20 0500 02/12/20 0650  ALBUMIN 2.6* 2.9* 3.2* 2.4*   No results for input(s): LIPASE, AMYLASE in the last 168 hours. No results for input(s): AMMONIA in the last 168 hours.  CBC: Recent Labs  Lab 02/07/20 0730 02/09/20 0500 02/12/20 0650 02/13/20 0521  WBC 15.7* 15.3* 17.5* 15.8*   HGB 8.7* 7.8* 8.2* 8.6*  HCT 27.8* 24.9* 25.6* 26.7*  MCV 84.5 84.1 84.5 83.2  PLT 197 169 163 151    Cardiac Enzymes: No results for input(s): CKTOTAL, CKMB, CKMBINDEX, TROPONINI in the last 168 hours.  BNP (last 3 results) No results for input(s): BNP in the last 8760 hours.  ProBNP (last 3 results) No results for input(s): PROBNP in the last 8760 hours.  Radiological Exams: No results found.  Assessment/Plan Active Problems:   Acute on chronic respiratory failure with hypoxia (HCC)   COVID-19 virus infection   Atrial fibrillation with RVR (HCC)   Severe sepsis (HCC)   Pneumonia due to COVID-19 virus   1. Acute on chronic respiratory failure hypoxia continue with T collar trials as tolerated patient right now is requiring 30% FiO2 patient is scheduled for possible discharge 2. COVID-19 virus infection resolved 3. Atrial fibrillation rate controlled 4. Severe sepsis resolved 5. Pneumonia due to COVID-19 resolved   I have personally seen and evaluated the patient, evaluated laboratory and imaging results, formulated the assessment and plan and placed orders. The Patient requires high complexity decision making with multiple systems involvement.  Rounds were done with the Respiratory Therapy Director and Staff therapists and discussed with nursing staff also.  Allyne Gee, MD [facility] Pulmonary Critical Care Medicine Sleep Medicine

## 2020-02-14 ENCOUNTER — Encounter (HOSPITAL_COMMUNITY): Payer: Self-pay | Admitting: Emergency Medicine

## 2020-02-14 ENCOUNTER — Emergency Department (HOSPITAL_COMMUNITY): Payer: Medicare HMO

## 2020-02-14 ENCOUNTER — Inpatient Hospital Stay (HOSPITAL_COMMUNITY)
Admission: EM | Admit: 2020-02-14 | Discharge: 2020-04-26 | DRG: 207 | Disposition: A | Discharge status: Hospice | Payer: Medicare HMO | Source: Other Acute Inpatient Hospital | Attending: Internal Medicine | Admitting: Internal Medicine

## 2020-02-14 DIAGNOSIS — Z87440 Personal history of urinary (tract) infections: Secondary | ICD-10-CM

## 2020-02-14 DIAGNOSIS — J9501 Hemorrhage from tracheostomy stoma: Secondary | ICD-10-CM | POA: Diagnosis present

## 2020-02-14 DIAGNOSIS — T827XXD Infection and inflammatory reaction due to other cardiac and vascular devices, implants and grafts, subsequent encounter: Secondary | ICD-10-CM | POA: Diagnosis not present

## 2020-02-14 DIAGNOSIS — Z452 Encounter for adjustment and management of vascular access device: Secondary | ICD-10-CM

## 2020-02-14 DIAGNOSIS — I361 Nonrheumatic tricuspid (valve) insufficiency: Secondary | ICD-10-CM | POA: Diagnosis not present

## 2020-02-14 DIAGNOSIS — T365X5A Adverse effect of aminoglycosides, initial encounter: Secondary | ICD-10-CM | POA: Diagnosis present

## 2020-02-14 DIAGNOSIS — I482 Chronic atrial fibrillation, unspecified: Secondary | ICD-10-CM | POA: Diagnosis present

## 2020-02-14 DIAGNOSIS — R6521 Severe sepsis with septic shock: Secondary | ICD-10-CM | POA: Diagnosis not present

## 2020-02-14 DIAGNOSIS — N170 Acute kidney failure with tubular necrosis: Secondary | ICD-10-CM | POA: Diagnosis present

## 2020-02-14 DIAGNOSIS — Z7189 Other specified counseling: Secondary | ICD-10-CM | POA: Diagnosis not present

## 2020-02-14 DIAGNOSIS — J189 Pneumonia, unspecified organism: Secondary | ICD-10-CM | POA: Diagnosis not present

## 2020-02-14 DIAGNOSIS — M24531 Contracture, right wrist: Secondary | ICD-10-CM | POA: Diagnosis present

## 2020-02-14 DIAGNOSIS — R627 Adult failure to thrive: Secondary | ICD-10-CM | POA: Diagnosis present

## 2020-02-14 DIAGNOSIS — J9622 Acute and chronic respiratory failure with hypercapnia: Secondary | ICD-10-CM | POA: Diagnosis not present

## 2020-02-14 DIAGNOSIS — J9611 Chronic respiratory failure with hypoxia: Secondary | ICD-10-CM | POA: Diagnosis not present

## 2020-02-14 DIAGNOSIS — Z931 Gastrostomy status: Secondary | ICD-10-CM

## 2020-02-14 DIAGNOSIS — I421 Obstructive hypertrophic cardiomyopathy: Secondary | ICD-10-CM | POA: Diagnosis present

## 2020-02-14 DIAGNOSIS — Z9911 Dependence on respirator [ventilator] status: Secondary | ICD-10-CM | POA: Diagnosis not present

## 2020-02-14 DIAGNOSIS — I69398 Other sequelae of cerebral infarction: Secondary | ICD-10-CM

## 2020-02-14 DIAGNOSIS — A4181 Sepsis due to Enterococcus: Secondary | ICD-10-CM | POA: Diagnosis not present

## 2020-02-14 DIAGNOSIS — N179 Acute kidney failure, unspecified: Secondary | ICD-10-CM | POA: Diagnosis not present

## 2020-02-14 DIAGNOSIS — E43 Unspecified severe protein-calorie malnutrition: Secondary | ICD-10-CM | POA: Diagnosis present

## 2020-02-14 DIAGNOSIS — R54 Age-related physical debility: Secondary | ICD-10-CM | POA: Diagnosis present

## 2020-02-14 DIAGNOSIS — K567 Ileus, unspecified: Secondary | ICD-10-CM

## 2020-02-14 DIAGNOSIS — M4628 Osteomyelitis of vertebra, sacral and sacrococcygeal region: Secondary | ICD-10-CM | POA: Diagnosis present

## 2020-02-14 DIAGNOSIS — E871 Hypo-osmolality and hyponatremia: Secondary | ICD-10-CM | POA: Diagnosis not present

## 2020-02-14 DIAGNOSIS — U071 COVID-19: Secondary | ICD-10-CM | POA: Diagnosis not present

## 2020-02-14 DIAGNOSIS — Z930 Tracheostomy status: Secondary | ICD-10-CM

## 2020-02-14 DIAGNOSIS — J9600 Acute respiratory failure, unspecified whether with hypoxia or hypercapnia: Secondary | ICD-10-CM

## 2020-02-14 DIAGNOSIS — E875 Hyperkalemia: Secondary | ICD-10-CM | POA: Diagnosis not present

## 2020-02-14 DIAGNOSIS — L8990 Pressure ulcer of unspecified site, unspecified stage: Secondary | ICD-10-CM | POA: Diagnosis present

## 2020-02-14 DIAGNOSIS — Z515 Encounter for palliative care: Secondary | ICD-10-CM | POA: Diagnosis not present

## 2020-02-14 DIAGNOSIS — Z66 Do not resuscitate: Secondary | ICD-10-CM | POA: Diagnosis present

## 2020-02-14 DIAGNOSIS — Z886 Allergy status to analgesic agent status: Secondary | ICD-10-CM

## 2020-02-14 DIAGNOSIS — I120 Hypertensive chronic kidney disease with stage 5 chronic kidney disease or end stage renal disease: Secondary | ICD-10-CM | POA: Diagnosis present

## 2020-02-14 DIAGNOSIS — Z681 Body mass index (BMI) 19 or less, adult: Secondary | ICD-10-CM | POA: Diagnosis not present

## 2020-02-14 DIAGNOSIS — M898X9 Other specified disorders of bone, unspecified site: Secondary | ICD-10-CM | POA: Diagnosis not present

## 2020-02-14 DIAGNOSIS — R7881 Bacteremia: Secondary | ICD-10-CM | POA: Diagnosis not present

## 2020-02-14 DIAGNOSIS — K4090 Unilateral inguinal hernia, without obstruction or gangrene, not specified as recurrent: Secondary | ICD-10-CM | POA: Diagnosis present

## 2020-02-14 DIAGNOSIS — R1312 Dysphagia, oropharyngeal phase: Secondary | ICD-10-CM | POA: Diagnosis present

## 2020-02-14 DIAGNOSIS — T827XXA Infection and inflammatory reaction due to other cardiac and vascular devices, implants and grafts, initial encounter: Secondary | ICD-10-CM

## 2020-02-14 DIAGNOSIS — R600 Localized edema: Secondary | ICD-10-CM | POA: Diagnosis present

## 2020-02-14 DIAGNOSIS — M109 Gout, unspecified: Secondary | ICD-10-CM | POA: Diagnosis present

## 2020-02-14 DIAGNOSIS — L89159 Pressure ulcer of sacral region, unspecified stage: Secondary | ICD-10-CM | POA: Diagnosis not present

## 2020-02-14 DIAGNOSIS — D6859 Other primary thrombophilia: Secondary | ICD-10-CM | POA: Diagnosis present

## 2020-02-14 DIAGNOSIS — I4892 Unspecified atrial flutter: Secondary | ICD-10-CM | POA: Diagnosis present

## 2020-02-14 DIAGNOSIS — J8410 Pulmonary fibrosis, unspecified: Secondary | ICD-10-CM | POA: Diagnosis present

## 2020-02-14 DIAGNOSIS — D62 Acute posthemorrhagic anemia: Secondary | ICD-10-CM | POA: Diagnosis present

## 2020-02-14 DIAGNOSIS — B948 Sequelae of other specified infectious and parasitic diseases: Secondary | ICD-10-CM

## 2020-02-14 DIAGNOSIS — J9690 Respiratory failure, unspecified, unspecified whether with hypoxia or hypercapnia: Secondary | ICD-10-CM

## 2020-02-14 DIAGNOSIS — J84178 Other interstitial pulmonary diseases with fibrosis in diseases classified elsewhere: Secondary | ICD-10-CM | POA: Diagnosis present

## 2020-02-14 DIAGNOSIS — T8579XA Infection and inflammatory reaction due to other internal prosthetic devices, implants and grafts, initial encounter: Secondary | ICD-10-CM | POA: Diagnosis not present

## 2020-02-14 DIAGNOSIS — J9621 Acute and chronic respiratory failure with hypoxia: Secondary | ICD-10-CM | POA: Diagnosis present

## 2020-02-14 DIAGNOSIS — R64 Cachexia: Secondary | ICD-10-CM | POA: Diagnosis present

## 2020-02-14 DIAGNOSIS — Z7401 Bed confinement status: Secondary | ICD-10-CM

## 2020-02-14 DIAGNOSIS — D631 Anemia in chronic kidney disease: Secondary | ICD-10-CM | POA: Diagnosis present

## 2020-02-14 DIAGNOSIS — M869 Osteomyelitis, unspecified: Secondary | ICD-10-CM

## 2020-02-14 DIAGNOSIS — J151 Pneumonia due to Pseudomonas: Secondary | ICD-10-CM | POA: Diagnosis not present

## 2020-02-14 DIAGNOSIS — I953 Hypotension of hemodialysis: Secondary | ICD-10-CM | POA: Diagnosis not present

## 2020-02-14 DIAGNOSIS — R0603 Acute respiratory distress: Secondary | ICD-10-CM

## 2020-02-14 DIAGNOSIS — R509 Fever, unspecified: Secondary | ICD-10-CM | POA: Diagnosis not present

## 2020-02-14 DIAGNOSIS — Z8701 Personal history of pneumonia (recurrent): Secondary | ICD-10-CM

## 2020-02-14 DIAGNOSIS — J9602 Acute respiratory failure with hypercapnia: Secondary | ICD-10-CM | POA: Diagnosis not present

## 2020-02-14 DIAGNOSIS — R069 Unspecified abnormalities of breathing: Secondary | ICD-10-CM

## 2020-02-14 DIAGNOSIS — A419 Sepsis, unspecified organism: Secondary | ICD-10-CM | POA: Diagnosis not present

## 2020-02-14 DIAGNOSIS — A411 Sepsis due to other specified staphylococcus: Secondary | ICD-10-CM | POA: Diagnosis not present

## 2020-02-14 DIAGNOSIS — Z992 Dependence on renal dialysis: Secondary | ICD-10-CM

## 2020-02-14 DIAGNOSIS — L89323 Pressure ulcer of left buttock, stage 3: Secondary | ICD-10-CM | POA: Diagnosis not present

## 2020-02-14 DIAGNOSIS — B952 Enterococcus as the cause of diseases classified elsewhere: Secondary | ICD-10-CM | POA: Diagnosis not present

## 2020-02-14 DIAGNOSIS — Z8719 Personal history of other diseases of the digestive system: Secondary | ICD-10-CM

## 2020-02-14 DIAGNOSIS — L89154 Pressure ulcer of sacral region, stage 4: Secondary | ICD-10-CM | POA: Diagnosis present

## 2020-02-14 DIAGNOSIS — G9349 Other encephalopathy: Secondary | ICD-10-CM | POA: Diagnosis present

## 2020-02-14 DIAGNOSIS — N186 End stage renal disease: Secondary | ICD-10-CM | POA: Diagnosis present

## 2020-02-14 DIAGNOSIS — I480 Paroxysmal atrial fibrillation: Secondary | ICD-10-CM | POA: Diagnosis present

## 2020-02-14 DIAGNOSIS — R0682 Tachypnea, not elsewhere classified: Secondary | ICD-10-CM

## 2020-02-14 DIAGNOSIS — Z933 Colostomy status: Secondary | ICD-10-CM

## 2020-02-14 DIAGNOSIS — R739 Hyperglycemia, unspecified: Secondary | ICD-10-CM | POA: Diagnosis not present

## 2020-02-14 DIAGNOSIS — R0902 Hypoxemia: Secondary | ICD-10-CM

## 2020-02-14 DIAGNOSIS — R042 Hemoptysis: Secondary | ICD-10-CM | POA: Diagnosis present

## 2020-02-14 DIAGNOSIS — R0600 Dyspnea, unspecified: Secondary | ICD-10-CM

## 2020-02-14 DIAGNOSIS — L89150 Pressure ulcer of sacral region, unstageable: Secondary | ICD-10-CM | POA: Diagnosis not present

## 2020-02-14 DIAGNOSIS — Z20822 Contact with and (suspected) exposure to covid-19: Secondary | ICD-10-CM | POA: Diagnosis present

## 2020-02-14 DIAGNOSIS — I739 Peripheral vascular disease, unspecified: Secondary | ICD-10-CM | POA: Diagnosis present

## 2020-02-14 DIAGNOSIS — R001 Bradycardia, unspecified: Secondary | ICD-10-CM | POA: Diagnosis present

## 2020-02-14 DIAGNOSIS — Z881 Allergy status to other antibiotic agents status: Secondary | ICD-10-CM

## 2020-02-14 DIAGNOSIS — Z880 Allergy status to penicillin: Secondary | ICD-10-CM

## 2020-02-14 DIAGNOSIS — Z79899 Other long term (current) drug therapy: Secondary | ICD-10-CM

## 2020-02-14 DIAGNOSIS — J9601 Acute respiratory failure with hypoxia: Secondary | ICD-10-CM | POA: Diagnosis not present

## 2020-02-14 DIAGNOSIS — R0602 Shortness of breath: Secondary | ICD-10-CM

## 2020-02-14 DIAGNOSIS — E876 Hypokalemia: Secondary | ICD-10-CM | POA: Diagnosis not present

## 2020-02-14 DIAGNOSIS — N400 Enlarged prostate without lower urinary tract symptoms: Secondary | ICD-10-CM | POA: Diagnosis present

## 2020-02-14 DIAGNOSIS — J411 Mucopurulent chronic bronchitis: Secondary | ICD-10-CM | POA: Diagnosis not present

## 2020-02-14 DIAGNOSIS — R69 Illness, unspecified: Secondary | ICD-10-CM

## 2020-02-14 DIAGNOSIS — I4891 Unspecified atrial fibrillation: Secondary | ICD-10-CM | POA: Diagnosis not present

## 2020-02-14 DIAGNOSIS — D649 Anemia, unspecified: Secondary | ICD-10-CM | POA: Diagnosis not present

## 2020-02-14 HISTORY — DX: Benign prostatic hyperplasia without lower urinary tract symptoms: N40.0

## 2020-02-14 HISTORY — DX: Cerebral infarction, unspecified: I63.9

## 2020-02-14 LAB — CBC
HCT: 25.9 % — ABNORMAL LOW (ref 39.0–52.0)
Hemoglobin: 8.4 g/dL — ABNORMAL LOW (ref 13.0–17.0)
MCH: 27.3 pg (ref 26.0–34.0)
MCHC: 32.4 g/dL (ref 30.0–36.0)
MCV: 84.1 fL (ref 80.0–100.0)
Platelets: 163 10*3/uL (ref 150–400)
RBC: 3.08 MIL/uL — ABNORMAL LOW (ref 4.22–5.81)
RDW: 18.2 % — ABNORMAL HIGH (ref 11.5–15.5)
WBC: 16.4 10*3/uL — ABNORMAL HIGH (ref 4.0–10.5)
nRBC: 0 % (ref 0.0–0.2)

## 2020-02-14 LAB — GLUCOSE, CAPILLARY
Glucose-Capillary: 126 mg/dL — ABNORMAL HIGH (ref 70–99)
Glucose-Capillary: 114 mg/dL — ABNORMAL HIGH (ref 70–99)

## 2020-02-14 LAB — CBC WITH DIFFERENTIAL/PLATELET
Abs Immature Granulocytes: 0.25 10*3/uL — ABNORMAL HIGH (ref 0.00–0.07)
Basophils Absolute: 0.1 10*3/uL (ref 0.0–0.1)
Basophils Relative: 0 %
Eosinophils Absolute: 0.5 10*3/uL (ref 0.0–0.5)
Eosinophils Relative: 3 %
HCT: 24.7 % — ABNORMAL LOW (ref 39.0–52.0)
Hemoglobin: 7.7 g/dL — ABNORMAL LOW (ref 13.0–17.0)
Immature Granulocytes: 2 %
Lymphocytes Relative: 12 %
Lymphs Abs: 1.9 10*3/uL (ref 0.7–4.0)
MCH: 26.7 pg (ref 26.0–34.0)
MCHC: 31.2 g/dL (ref 30.0–36.0)
MCV: 85.8 fL (ref 80.0–100.0)
Monocytes Absolute: 1.2 10*3/uL — ABNORMAL HIGH (ref 0.1–1.0)
Monocytes Relative: 8 %
Neutro Abs: 12.2 10*3/uL — ABNORMAL HIGH (ref 1.7–7.7)
Neutrophils Relative %: 75 %
Platelets: 128 10*3/uL — ABNORMAL LOW (ref 150–400)
RBC: 2.88 MIL/uL — ABNORMAL LOW (ref 4.22–5.81)
RDW: 18.4 % — ABNORMAL HIGH (ref 11.5–15.5)
WBC: 16.1 10*3/uL — ABNORMAL HIGH (ref 4.0–10.5)
nRBC: 0 % (ref 0.0–0.2)

## 2020-02-14 LAB — RENAL FUNCTION PANEL
Albumin: 2.8 g/dL — ABNORMAL LOW (ref 3.5–5.0)
Anion gap: 16 — ABNORMAL HIGH (ref 5–15)
BUN: 121 mg/dL — ABNORMAL HIGH (ref 8–23)
CO2: 25 mmol/L (ref 22–32)
Calcium: 10.8 mg/dL — ABNORMAL HIGH (ref 8.9–10.3)
Chloride: 97 mmol/L — ABNORMAL LOW (ref 98–111)
Creatinine, Ser: 2.5 mg/dL — ABNORMAL HIGH (ref 0.61–1.24)
GFR calc Af Amer: 29 mL/min — ABNORMAL LOW (ref >60)
GFR calc non Af Amer: 25 mL/min — ABNORMAL LOW (ref >60)
Glucose, Bld: 135 mg/dL — ABNORMAL HIGH (ref 70–99)
Phosphorus: 2.3 mg/dL — ABNORMAL LOW (ref 2.5–4.6)
Potassium: 3.5 mmol/L (ref 3.5–5.1)
Sodium: 138 mmol/L (ref 135–145)

## 2020-02-14 LAB — COMPREHENSIVE METABOLIC PANEL
ALT: 24 U/L (ref 0–44)
AST: 29 U/L (ref 15–41)
Albumin: 3.4 g/dL — ABNORMAL LOW (ref 3.5–5.0)
Alkaline Phosphatase: 116 U/L (ref 38–126)
Anion gap: 12 (ref 5–15)
BUN: 63 mg/dL — ABNORMAL HIGH (ref 8–23)
CO2: 27 mmol/L (ref 22–32)
Calcium: 9.9 mg/dL (ref 8.9–10.3)
Chloride: 101 mmol/L (ref 98–111)
Creatinine, Ser: 1.52 mg/dL — ABNORMAL HIGH (ref 0.61–1.24)
GFR calc Af Amer: 52 mL/min — ABNORMAL LOW (ref >60)
GFR calc non Af Amer: 45 mL/min — ABNORMAL LOW (ref >60)
Glucose, Bld: 180 mg/dL — ABNORMAL HIGH (ref 70–99)
Potassium: 3.6 mmol/L (ref 3.5–5.1)
Sodium: 140 mmol/L (ref 135–145)
Total Bilirubin: 1.1 mg/dL (ref 0.3–1.2)
Total Protein: 8.3 g/dL — ABNORMAL HIGH (ref 6.5–8.1)

## 2020-02-14 LAB — PROTIME-INR
INR: 1.2 (ref 0.8–1.2)
Prothrombin Time: 15.2 seconds (ref 11.4–15.2)

## 2020-02-14 LAB — LACTIC ACID, PLASMA: Lactic Acid, Venous: 1.2 mmol/L (ref 0.5–1.9)

## 2020-02-14 LAB — SARS CORONAVIRUS 2 BY RT PCR (HOSPITAL ORDER, PERFORMED IN ~~LOC~~ HOSPITAL LAB): SARS Coronavirus 2: NEGATIVE

## 2020-02-14 LAB — HEPATITIS B SURFACE ANTIGEN: Hepatitis B Surface Ag: NONREACTIVE

## 2020-02-14 MED ORDER — PANTOPRAZOLE SODIUM 40 MG IV SOLR
40.0000 mg | INTRAVENOUS | Status: DC
  Administered 2020-02-14 – 2020-02-29 (×16): 40 mg via INTRAVENOUS
  Filled 2020-02-14 (×17): qty 40

## 2020-02-14 MED ORDER — SODIUM CHLORIDE 0.9 % IV SOLN: 2.0000 g | Freq: Once | INTRAVENOUS | Status: DC

## 2020-02-14 MED ORDER — POLYETHYLENE GLYCOL 3350 17 G PO PACK
17.0000 g | PACK | Freq: Every day | ORAL | Status: DC
  Administered 2020-02-15 – 2020-02-16 (×2): 17 g via ORAL
  Filled 2020-02-14 (×2): qty 1

## 2020-02-14 MED ORDER — VANCOMYCIN HCL IN DEXTROSE 1-5 GM/200ML-% IV SOLN: 1000.0000 mg | INTRAVENOUS | Status: DC

## 2020-02-14 MED ORDER — FENTANYL CITRATE (PF) 100 MCG/2ML IJ SOLN
INTRAMUSCULAR | Status: AC
  Filled 2020-02-14: qty 2

## 2020-02-14 MED ORDER — FENTANYL CITRATE (PF) 100 MCG/2ML IJ SOLN
25.0000 ug | INTRAMUSCULAR | Status: DC | PRN
  Administered 2020-02-16 – 2020-02-18 (×7): 50 ug via INTRAVENOUS
  Administered 2020-02-19 (×3): 100 ug via INTRAVENOUS
  Administered 2020-02-20 – 2020-02-22 (×6): 50 ug via INTRAVENOUS
  Administered 2020-02-23: 100 ug via INTRAVENOUS
  Administered 2020-02-23 – 2020-02-27 (×2): 50 ug via INTRAVENOUS
  Administered 2020-02-28 – 2020-02-29 (×5): 100 ug via INTRAVENOUS
  Administered 2020-02-29: 50 ug via INTRAVENOUS
  Administered 2020-02-29: 100 ug via INTRAVENOUS
  Administered 2020-03-01: 50 ug via INTRAVENOUS
  Administered 2020-03-01 – 2020-03-05 (×8): 100 ug via INTRAVENOUS
  Administered 2020-03-05 – 2020-03-08 (×5): 50 ug via INTRAVENOUS
  Administered 2020-03-12: 25 ug via INTRAVENOUS
  Administered 2020-03-18: 50 ug via INTRAVENOUS
  Administered 2020-03-19: 100 ug via INTRAVENOUS
  Administered 2020-04-23 – 2020-04-24 (×3): 25 ug via INTRAVENOUS
  Filled 2020-02-14 (×47): qty 2

## 2020-02-14 MED ORDER — VANCOMYCIN HCL 1500 MG/300ML IV SOLN
1500.0000 mg | Freq: Once | INTRAVENOUS | Status: DC
  Administered 2020-02-14: 1500 mg via INTRAVENOUS
  Filled 2020-02-14: qty 300

## 2020-02-14 MED ORDER — MIDAZOLAM HCL 2 MG/2ML IJ SOLN
INTRAMUSCULAR | Status: AC
  Administered 2020-02-14: 2 mg via INTRAVENOUS
  Filled 2020-02-14: qty 2

## 2020-02-14 MED ORDER — DOCUSATE SODIUM 50 MG/5ML PO LIQD
100.0000 mg | Freq: Two times a day (BID) | ORAL | Status: DC
  Administered 2020-02-15 – 2020-02-16 (×4): 100 mg via ORAL
  Filled 2020-02-14 (×5): qty 10

## 2020-02-14 MED ORDER — FENTANYL CITRATE (PF) 100 MCG/2ML IJ SOLN: 100.0000 ug | INTRAMUSCULAR | Status: DC | PRN

## 2020-02-14 MED ORDER — SODIUM CHLORIDE 0.9 % IV SOLN: 2.0000 g | Freq: Two times a day (BID) | INTRAVENOUS | Status: DC

## 2020-02-14 MED ORDER — FENTANYL CITRATE (PF) 100 MCG/2ML IJ SOLN
25.0000 ug | INTRAMUSCULAR | Status: AC | PRN
  Administered 2020-02-14 – 2020-02-29 (×3): 25 ug via INTRAVENOUS
  Filled 2020-02-14 (×5): qty 2

## 2020-02-14 MED ORDER — MIDAZOLAM HCL 2 MG/2ML IJ SOLN: 2.0000 mg | Freq: Once | INTRAMUSCULAR | Status: AC

## 2020-02-14 MED ORDER — SODIUM CHLORIDE 0.9 % IV SOLN
2.0000 g | Freq: Two times a day (BID) | INTRAVENOUS | Status: DC
  Administered 2020-02-14: 2 g via INTRAVENOUS
  Filled 2020-02-14: qty 2

## 2020-02-14 NOTE — ED Triage Notes (Signed)
Pt arrives from [facility] with reports of bleeding from trach site starting last night and worsening throughout the day. Upon arrival, pt is on NRB and trach collar and site is still bleeding.

## 2020-02-14 NOTE — Progress Notes (Signed)
eLink Physician-Brief Progress Note Patient Name: Shane Banks DOB: [birth year] MRN: [id number]   Date of Service  02/14/2020  HPI/Events of Note  12 Lead EKG shows atrial flutter with 4:1 block and a controlled ventricular response rate of 75.  eICU Interventions  Given patient's presentation with hemoptysis, anticoagulation for his arrhythmia is contraindicated.        [name] Ermie Glendenning 02/14/2020, 10:41 PM

## 2020-02-14 NOTE — Progress Notes (Signed)
eLink Physician-Brief Progress Note Patient Name: Shane Banks DOB: [birth year] MRN: [id number]   Date of Service  02/14/2020  HPI/Events of Note  Pt has a problem with his tracheostomy and is intermittently losing tidal volumes.  eICU Interventions  PCCM bedside crew requested to go and see the patient.        [name] Mukhtar Shams 02/14/2020, 11:23 PM

## 2020-02-14 NOTE — Progress Notes (Signed)
Patient transported to 9N50 without complications.

## 2020-02-14 NOTE — ED Provider Notes (Signed)
[facility] EMERGENCY DEPARTMENT Provider Note   CSN: [id number] Arrival date & time: 02/14/20  1420     History Chief Complaint  Patient presents with  . trach bleeding    Shane Banks is a 74 y.o. male.  HPI Patient is sent from [place] for bleeding tracheostomy.  Patient has extensive and complex past medical history.  He has been hospitalized since February.  Patient has medical history of CVA, ESRD on dialysis, atrial fibrillation.  He developed Covid 19 pneumonia and ARDS with hypoxic respiratory failure.  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  His nurse reports that yesterday towards evening he started having some coughing with blood-tinged sputum.  This progressed and today he has had fairly copious bleeding with some clots.  [facility] the patient and reports he was suctioning there bringing up fresh blood from the airway.  They have not documented any fever at this time.  Patient was dialyzed today per usual routine.  Patient is severely debilitated and nonambulatory at baseline.  He reportedly can make some very short one-word verbal responses but generally is not communicative verbally.    Past Medical History:  Diagnosis Date  . Acute on chronic respiratory failure with hypoxia ([place])   . Atrial fibrillation with RVR ([place])   . BPH (benign prostatic hyperplasia)   . COVID-19 virus infection   . Pneumonia due to COVID-19 virus   . Severe sepsis ([place])   . Stroke [facility])     Patient Active Problem List   Diagnosis Date Noted  . Acute on chronic respiratory failure with hypoxia ([place])   . COVID-19 virus infection   . Atrial fibrillation with RVR ([place])   . Severe sepsis ([place])   . Pneumonia due to COVID-19 virus     Past Surgical History:  Procedure Laterality Date  . IR FLUORO GUIDE CV LINE RIGHT  12/15/2019  . IR THORACENTESIS ASP PLEURAL SPACE W/IMG GUIDE  12/21/2019  .  IR US GUIDE VASC ACCESS RIGHT  12/15/2019       No family history on file.  Social History   Tobacco Use  . Smoking status: Not on file  Substance Use Topics  . Alcohol use: Not on file  . Drug use: Not on file    Home Medications Prior to Admission medications   Not on File    Allergies    Patient has no allergy information on record.  Review of Systems   Review of Systems Level 5 caveat cannot obtain review of systems due to patient condition and nonverbal state. Physical Exam Updated Vital Signs BP 118/63   Pulse (!) 104   Resp (!) 22   SpO2 90%   Physical Exam Constitutional:      Comments: Patient is very deconditioned and ill in appearance.    HENT:     Nose: Nose normal.  Neck:     Comments: Patient has a tracheostomy in place.  There is fresh blood all around the tracheostomy.  There is not active blood emanating from it.  The suctioned material is bright red and frothy.  There are a few clots. Cardiovascular:     Comments: Tachycardia regular. Pulmonary:     Comments: Tachypnea.  Patient has rhonchi bilateral lung fields. Abdominal:     Comments: Abdomen has a feeding tube in place and a colostomy.  Multiple well-healed surgical scars.  Musculoskeletal:  Comments: Patient's feet are in protective boots.  Patient has extensive contractures of extremities.  Skin:    General: Skin is warm and dry.     Coloration: Skin is pale.  Neurological:     Comments: Patient is awake and looking around.  He does not follow commands.  He has contractures and limited use of extremities.     ED Results / Procedures / Treatments   Labs (all labs ordered are listed, but only abnormal results are displayed) Labs Reviewed  CBC WITH DIFFERENTIAL/PLATELET - Abnormal; Notable for the following components:      Result Value   WBC 16.1 (*)    RBC 2.88 (*)    Hemoglobin 7.7 (*)    HCT 24.7 (*)    RDW 18.4 (*)    Platelets 128 (*)    Neutro Abs 12.2 (*)     Monocytes Absolute 1.2 (*)    Abs Immature Granulocytes 0.25 (*)    All other components within normal limits  CULTURE, BLOOD (ROUTINE X 2)  CULTURE, BLOOD (ROUTINE X 2)  SARS CORONAVIRUS 2 BY RT PCR (HOSPITAL ORDER, [place] [facility])  PROTIME-INR  COMPREHENSIVE METABOLIC PANEL  LACTIC ACID, PLASMA  LACTIC ACID, PLASMA  URINALYSIS, ROUTINE W REFLEX MICROSCOPIC  TYPE AND SCREEN    EKG EKG Interpretation  Date/Time:  Wednesday Feb 14 2020 14:20:57 EDT Ventricular Rate:  113 PR Interval:    QRS Duration: 136 QT Interval:  389 QTC Calculation: 534 R Axis:   -63 Text Interpretation: Atrial flutter Left bundle branch block agree, old bundle brach block, rate related changed otherwise similar to previous Confirmed by Charlesetta Shanks [id number]) on 02/14/2020 3:15:21 PM   Radiology DG Chest Port 1 View  Result Date: 02/14/2020 CLINICAL DATA:  Hemoptysis. EXAM: PORTABLE CHEST 1 VIEW COMPARISON:  Feb 02, 2020. FINDINGS: Stable cardiomegaly with central pulmonary vascular congestion. Tracheostomy tube is unchanged in position. Right internal jugular dialysis catheter is noted with tip in expected position of right atrium. No pneumothorax is noted. Stable bilateral lung opacities are noted concerning for either edema or multifocal pneumonia, with probable small bilateral pleural effusions. Bony thorax is unremarkable. IMPRESSION: Stable cardiomegaly with central pulmonary vascular congestion. Stable bilateral lung opacities are noted concerning for either edema or multifocal pneumonia, with probable small bilateral pleural effusions. Aortic Atherosclerosis (ICD10-I70.0). Electronically Signed   By: Marijo Conception M.D.   On: 02/14/2020 14:47    Procedures Procedures (including critical care time) CRITICAL CARE Performed by: Charlesetta Shanks   Total critical care time: 30 minutes  Critical care time was exclusive of separately billable procedures and treating other  patients.  Critical care was necessary to treat or prevent imminent or life-threatening deterioration.  Critical care was time spent personally by me on the following activities: development of treatment plan with patient and/or surrogate as well as nursing, discussions with consultants, evaluation of patient's response to treatment, examination of patient, obtaining history from patient or surrogate, ordering and performing treatments and interventions, ordering and review of laboratory studies, ordering and review of radiographic studies, pulse oximetry and re-evaluation of patient's condition. Medications Ordered in ED Medications - No data to display  ED Course  I have reviewed the triage vital signs and the nursing notes.  Pertinent labs & imaging results that were available during my care of the patient were reviewed by me and considered in my medical decision making (see chart for details).    MDM Rules/Calculators/A&P  Patient has severe chronic medical illnesses.  He presents with increasing cough productive of hemoptysis.  Chest x-ray suggests consolidations.  Patient does have leukocytosis.  Hemoptysis may be in relationship to trach trauma from suctioning, however with chest x-ray suggesting infiltrates and leukocytosis, will initiate treatment for HCAP and plan for admission.  The suction material from the airway is bright red and somewhat frothy.  However, no continuous bleeding noted. 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

## 2020-02-14 NOTE — H&P (Signed)
NAME:  Cristian Davitt, MRN:  [id number], DOB:  1945-11-29, LOS: 0 ADMISSION DATE:  02/14/2020, CONSULTATION DATE:  5/19 REFERRING MD:  Dr. Alvino Chapel, CHIEF COMPLAINT:  Hemoptysis   Brief History   74 year old male. COVID ARDS in January. Unfortunately required trach and LTACH placement. Had been tolerating trach collar, but on 5/19 he developed hemoptysis and hypoxemic respiratory failure requiring vent.   History of present illness   74 year old male with past medical history as below, which is significant for Atrial fibrillation and stroke. He was admitted to [facility] for [place] 19 pneumonia, which unfortunately progressed to ARDS.  It seems as though his highest vent settings were 60% FiO2 and 8 of PEEP.  He had very little improvement in his ventilatory and oxygenation use over the course of his hospitalization and ultimately underwent tracheostomy [facility] [place] [facility]. His course at [facility] was complicated by ongoing infections including UTI, MDRO pseudomonas pneumonia, and stenotrophomonas bacteremia. As a result of sepsis and shock he developed renal failure requiring dialysis, which he now receives M/W/F. In the morning hours oh 5/19 he developed hemoptysis. He had previously been taken off anticoagulation (for AF) due to anemia with positive hemoccult. Transferred to [facility] ED for evaluation of hemoptysis. Prior to the transfer he had been tolerating 28% ATC.   Past Medical History   has a past medical history of Acute on chronic respiratory failure with hypoxia ([place]), Atrial fibrillation with RVR ([place]), BPH (benign prostatic hyperplasia), COVID-19 virus infection, Pneumonia due to COVID-19 virus, Severe sepsis ([place]), and Stroke ([place]).   Significant Hospital Events   1/8 admit to [facility] for COVID PNA 2/23 transfer to [facility] on vent 5/19 transfer to [facility] for hemoptysis. #6 cuffed trach placed. Back on  vent.   Consults:  ENT  Procedures:  ENT flex scope 5/19 > no bleeding source identified FOB 5/19 > no pulmonary bleeding source identified.   Significant Diagnostic Tests:  CTA chest 5/19 >  Micro Data:  BAL 5/19 >  Antimicrobials:  Flagyl Stopped PTA Cefepime stopped PTA Inhaled Tobramycin stopped PTA  Interim history/subjective:    Objective   Blood pressure (!) 179/120, pulse 84, resp. rate 14, height _0  (1.753 m), weight 65 kg, SpO2 (!) 78 %.       No intake or output data in the 24 hours ending 02/14/20 1657 Filed Weights   02/14/20 1516  Weight: 65 kg    Examination: General: Frail elderly male in mild distress HENT: Trach in place. Hemoptysis mostly around trach stoma, not much in-line.  Lungs: Coarse bilaterally.  Cardiovascular: Tachy, regular, no MRG Abdomen: Soft, non-tender, non-distended Extremities: No acute deformity Neuro: Eyes open, but no meaningful response. This has been his baseline per [facility] RN.   Resolved Hospital Problem list     Assessment & Plan:   Acute hypoxemic respiratory failure: he has a trach s/p COVID infection earlier this year. His oxygenation had  improved to tolerate 28% ATC. Course since complicated by multiple pneumonias, which is seems he has complete tx for (MDRO pseudomonas treated with inhaled tobramycin). Now he has had hemoptysis and aspirated blood. Decompensated in ER in this setting. "Hemoptysis" seems to be originating from stoma. Ulceration? - Cuffed trach placed - Full vent support - Bronchoscopy per Dr. Lamonte Sakai.  - BAL culture pending.  - Hold ABX for now. - CTA chest to r/o PE.  -  If bleeding persists consider ENT re-eval. May benefit from DDAVP ans is mildly uremic.   ESRD on HD:  - Full tx 5/19 - Consult nephrology 5/20 - Trend BMP  Atrial fibrillation: ST here in ED. Amiodarone stopped at [facility] due to bradycardia, anticoagulation stopped due to suspected GIB with several transfusions.  - Telemetry  monitoring.   Nutrition: TF via PEG  Sacral decub: WOC consult   Best practice:  Diet: TF Pain/Anxiety/Delirium protocol (if indicated): PRN sedation for vent complaince VAP protocol (if indicated): Per protocol DVT prophylaxis: SCD GI prophylaxis: PPI Glucose control: SSI Mobility: BR Code Status: DNR Family Communication: Sister updated via phone.  Disposition: ICU on vent.   Labs   CBC: Recent Labs  Lab 02/09/20 0500 02/12/20 0650 02/13/20 0521 02/14/20 0556 02/14/20 1433  WBC 15.3* 17.5* 15.8* 16.4* 16.1*  NEUTROABS  --   --   --   --  12.2*  HGB 7.8* 8.2* 8.6* 8.4* 7.7*  HCT 24.9* 25.6* 26.7* 25.9* 24.7*  MCV 84.1 84.5 83.2 84.1 85.8  PLT 169 163 151 163 128*    Basic Metabolic Panel: Recent Labs  Lab 02/08/20 0759 02/09/20 0500 02/12/20 0650 02/14/20 0556 02/14/20 1433  NA 139 138 138 138 140  K 3.5 3.6 4.0 3.5 3.6  CL 100 99 99 97* 101  CO2 _0 GLUCOSE 128* 102* 139* 135* 180*  BUN 64* 68* 161* 121* 63*  CREATININE 1.30* 1.25* 2.68* 2.50* 1.52*  CALCIUM 10.1 10.0 10.7* 10.8* 9.9  MG 1.7  --   --   --   --   PHOS <1.0* 2.1* 3.1 2.3*  --    GFR: Estimated Creatinine Clearance: 40.4 mL/min (A) (by C-G formula based on SCr of 1.52 mg/dL (H)). Recent Labs  Lab 02/12/20 0650 02/13/20 0521 02/14/20 0556 02/14/20 1433 02/14/20 1457  WBC 17.5* 15.8* 16.4* 16.1*  --   LATICACIDVEN  --   --   --   --  1.2    Liver Function Tests: Recent Labs  Lab 02/08/20 0759 02/09/20 0500 02/12/20 0650 02/14/20 0556 02/14/20 1433  AST  --   --   --   --  29  ALT  --   --   --   --  24  ALKPHOS  --   --   --   --  116  BILITOT  --   --   --   --  1.1  PROT  --   --   --   --  8.3*  ALBUMIN 2.9* 3.2* 2.4* 2.8* 3.4*   No results for input(s): LIPASE, AMYLASE in the last 168 hours. No results for input(s): AMMONIA in the last 168 hours.  ABG    Component Value Date/Time   PHART 7.499 (H) 02/09/2020 2143   PCO2ART 35.5 02/09/2020 2143    PO2ART 64.9 (L) 02/09/2020 2143   HCO3 27.4 02/09/2020 2143   ACIDBASEDEF 0.4 12/17/2019 1809   O2SAT 96.1 02/09/2020 2143     Coagulation Profile: Recent Labs  Lab 02/14/20 1433  INR 1.2    Cardiac Enzymes: No results for input(s): CKTOTAL, CKMB, CKMBINDEX, TROPONINI in the last 168 hours.  HbA1C: Hgb A1c MFr Bld  Date/Time Value Ref Range Status  11/30/2019 06:56 AM 6.0 (H) 4.8 - 5.6 % Final    Comment:    (NOTE) Pre diabetes:          5.7%-6.4% Diabetes:              >  6.4% Glycemic control for   <7.0% adults with diabetes     CBG: No results for input(s): GLUCAP in the last 168 hours.  Review of Systems:   Patient is encephalopathic and/or intubated. Therefore history has been obtained from chart review.   Past Medical History  He,  has a past medical history of Acute on chronic respiratory failure with hypoxia ([place]), Atrial fibrillation with RVR ([place]), BPH (benign prostatic hyperplasia), COVID-19 virus infection, Pneumonia due to COVID-19 virus, Severe sepsis ([place]), and Stroke ([place]).   Surgical History    Past Surgical History:  Procedure Laterality Date  . IR FLUORO GUIDE CV LINE RIGHT  12/15/2019  . IR THORACENTESIS ASP PLEURAL SPACE W/IMG GUIDE  12/21/2019  . IR US GUIDE VASC ACCESS RIGHT  12/15/2019     Social History      Family History   His family history is not on file.   Allergies Not on File   Home Medications  Prior to Admission medications   Not on File     Critical care time:45 mins    Georgann Housekeeper, AGACNP-BC [place] for personal pager PCCM on call pager [phone]  02/14/2020 4:57 PM

## 2020-02-14 NOTE — Progress Notes (Signed)
Contacted e-link regarding trach tube leak & low VE.  Respiratory at bedside awaiting CCM arrival.

## 2020-02-14 NOTE — Progress Notes (Signed)
[facility]  ROUNDING NOTE   Subjective:  Patient appears to have bloody secretions from his tracheostomy. ENT has been consulted.  Objective:  Vital signs in last 24 hours:  Temperature 97.3 pulse 100 respirations 25 blood pressure 114/68  Physical Exam: General: Critically ill-appearing  Head: Normocephalic, atraumatic. Moist oral mucosal membranes  Eyes: Anicteric  Neck: Tracheostomy in place  Lungs:  Scattered rhonchi, mild tachypnea  Heart: S1S2 no rubs  Abdomen:  Soft, nontender, bowel sounds present, PEG tube in place  Extremities: 1+ peripheral edema  Neurologic: Awake, alert  Skin: No rash  Access: Right IJ temporary dialysis catheter    Basic Metabolic Panel: Recent Labs  Lab 02/08/20 0759 02/08/20 0759 02/09/20 0500 02/12/20 0650 02/14/20 0556  NA 139  --  138 138 138  K 3.5  --  3.6 4.0 3.5  CL 100  --  99 99 97*  CO2 28  --  24 25 25   GLUCOSE 128*  --  102* 139* 135*  BUN 64*  --  68* 161* 121*  CREATININE 1.30*  --  1.25* 2.68* 2.50*  CALCIUM 10.1   < > 10.0 10.7* 10.8*  MG 1.7  --   --   --   --   PHOS <1.0*  --  2.1* 3.1 2.3*   < > = values in this interval not displayed.    Liver Function Tests: Recent Labs  Lab 02/08/20 0759 02/09/20 0500 02/12/20 0650 02/14/20 0556  ALBUMIN 2.9* 3.2* 2.4* 2.8*   No results for input(s): LIPASE, AMYLASE in the last 168 hours. No results for input(s): AMMONIA in the last 168 hours.  CBC: Recent Labs  Lab 02/09/20 0500 02/12/20 0650 02/13/20 0521 02/14/20 0556  WBC 15.3* 17.5* 15.8* 16.4*  HGB 7.8* 8.2* 8.6* 8.4*  HCT 24.9* 25.6* 26.7* 25.9*  MCV 84.1 84.5 83.2 84.1  PLT 169 163 151 163    Cardiac Enzymes: No results for input(s): CKTOTAL, CKMB, CKMBINDEX, TROPONINI in the last 168 hours.  BNP: Invalid input(s): POCBNP  CBG: No results for input(s): GLUCAP in the last 168 hours.  Microbiology: Results for orders placed or performed during the hospital encounter of 11/21/19   Culture, Urine     Status: Abnormal   Collection Time: 11/22/19  1:25 PM   Specimen: Urine, Random  Result Value Ref Range Status   Specimen Description URINE, RANDOM  Final   Special Requests   Final    NONE Performed at [facility], [address] [place], [place] [address]    Culture 50,000 COLONIES/mL YEAST (A)  Final   Report Status 11/23/2019 FINAL  Final  Culture, blood (routine x 2)     Status: None   Collection Time: 11/22/19  1:40 PM   Specimen: BLOOD LEFT HAND  Result Value Ref Range Status   Specimen Description BLOOD LEFT HAND  Final   Special Requests   Final    BOTTLES DRAWN AEROBIC ONLY Blood Culture results may not be optimal due to an inadequate volume of blood received in culture bottles   Culture   Final    NO GROWTH 5 DAYS Performed at [facility], [place] [address], [place], [place] [address]    Report Status 11/27/2019 FINAL  Final  Culture, blood (routine x 2)     Status: None   Collection Time: 11/22/19  1:44 PM   Specimen: BLOOD LEFT HAND  Result Value Ref Range Status   Specimen Description BLOOD  LEFT HAND  Final   Special Requests   Final    BOTTLES DRAWN AEROBIC ONLY Blood Culture results may not be optimal due to an inadequate volume of blood received in culture bottles   Culture   Final    NO GROWTH 5 DAYS Performed at [facility], [place] [address], [place], [place] [address]    Report Status 11/27/2019 FINAL  Final  Culture, respiratory (non-expectorated)     Status: None   Collection Time: 11/22/19  4:59 PM   Specimen: Tracheal Aspirate; Respiratory  Result Value Ref Range Status   Specimen Description TRACHEAL ASPIRATE  Final   Special Requests NONE  Final   Gram Stain   Final    ABUNDANT WBC PRESENT, PREDOMINANTLY PMN NO ORGANISMS SEEN    Culture   Final    NO GROWTH 2 DAYS Performed at [facility], [address] [place], [place] [address]    Report Status 11/24/2019 FINAL  Final  Culture, respiratory  (non-expectorated)     Status: None   Collection Time: 12/04/19  2:22 AM   Specimen: Tracheal Aspirate; Respiratory  Result Value Ref Range Status   Specimen Description TRACHEAL ASPIRATE  Final   Special Requests NONE  Final   Gram Stain   Final    ABUNDANT WBC PRESENT, PREDOMINANTLY PMN ABUNDANT GRAM NEGATIVE RODS Performed at [facility], [address], [place], [place] [address]    Culture   Final    MODERATE PSEUDOMONAS AERUGINOSA FEW ESCHERICHIA COLI Confirmed Extended Spectrum Beta-Lactamase Producer (ESBL).  In bloodstream infections from ESBL organisms, carbapenems are preferred over piperacillin/tazobactam. They are shown to have a lower risk of mortality.    Report Status 12/09/2019 FINAL  Final   Organism ID, Bacteria PSEUDOMONAS AERUGINOSA  Final   Organism ID, Bacteria ESCHERICHIA COLI  Final      Susceptibility   Escherichia coli - MIC*    AMPICILLIN >=32 RESISTANT Resistant     CEFAZOLIN >=64 RESISTANT Resistant     CEFEPIME 2 SENSITIVE Sensitive     CEFTAZIDIME RESISTANT Resistant     CEFTRIAXONE >=64 RESISTANT Resistant     CIPROFLOXACIN <=0.25 SENSITIVE Sensitive     GENTAMICIN >=16 RESISTANT Resistant     IMIPENEM <=0.25 SENSITIVE Sensitive     TRIMETH/SULFA >=320 RESISTANT Resistant     AMPICILLIN/SULBACTAM 16 INTERMEDIATE Intermediate     PIP/TAZO <=4 SENSITIVE Sensitive     * FEW ESCHERICHIA COLI   Pseudomonas aeruginosa - MIC*    CEFTAZIDIME 16 INTERMEDIATE Intermediate     CIPROFLOXACIN >=4 RESISTANT Resistant     GENTAMICIN 2 SENSITIVE Sensitive     IMIPENEM >=16 RESISTANT Resistant     * MODERATE PSEUDOMONAS AERUGINOSA  Culture, Urine     Status: Abnormal   Collection Time: 12/04/19  6:33 PM   Specimen: Urine, Random  Result Value Ref Range Status   Specimen Description URINE, RANDOM  Final   Special Requests   Final    NONE Performed at [facility], [address] [place], [place] [address]    Culture >=100,000 COLONIES/mL  PSEUDOMONAS AERUGINOSA (A)  Final   Report Status 12/06/2019 FINAL  Final   Organism ID, Bacteria PSEUDOMONAS AERUGINOSA (A)  Final      Susceptibility   Pseudomonas aeruginosa - MIC*    CEFTAZIDIME 4 SENSITIVE Sensitive     CIPROFLOXACIN >=4 RESISTANT Resistant     GENTAMICIN 4 SENSITIVE Sensitive     IMIPENEM  2 SENSITIVE Sensitive     PIP/TAZO 16 SENSITIVE Sensitive     * >=100,000 COLONIES/mL PSEUDOMONAS AERUGINOSA  Culture, blood (routine x 2)     Status: Abnormal   Collection Time: 12/11/19  9:39 AM   Specimen: BLOOD LEFT HAND  Result Value Ref Range Status   Specimen Description BLOOD LEFT HAND  Final   Special Requests   Final    BOTTLES DRAWN AEROBIC AND ANAEROBIC Blood Culture adequate volume   Culture  Setup Time   Final    GRAM POSITIVE COCCI IN CLUSTERS ANAEROBIC BOTTLE ONLY CRITICAL RESULT CALLED TO, READ BACK BY AND VERIFIED WITH: RN R [place] [id number] AT 1 BY CM    Culture (A)  Final    STAPHYLOCOCCUS SPECIES (COAGULASE NEGATIVE) THE SIGNIFICANCE OF ISOLATING THIS ORGANISM FROM A SINGLE SET OF BLOOD CULTURES WHEN MULTIPLE SETS ARE DRAWN IS UNCERTAIN. PLEASE NOTIFY THE MICROBIOLOGY DEPARTMENT WITHIN ONE WEEK IF SPECIATION AND SENSITIVITIES ARE REQUIRED. Performed at [facility], [place] [address] [place], [place] [address]    Report Status 12/14/2019 FINAL  Final  Culture, blood (routine x 2)     Status: None   Collection Time: 12/11/19  9:47 AM   Specimen: BLOOD  Result Value Ref Range Status   Specimen Description BLOOD RIGHT ANTECUBITAL  Final   Special Requests   Final    BOTTLES DRAWN AEROBIC AND ANAEROBIC Blood Culture adequate volume   Culture  Setup Time   Final    CORRECTED RESULTS NO ORGANISMS SEEN PREVIOUSLY REPORTED AS: GRAM POSITIVE COCCI IN CLUSTERS CORRECTED RESULTS CALLED TO: RN R [place] [id number] AT 920 AM BY CM    Culture   Final    NO GROWTH 5 DAYS Performed at [facility], [place] [address], [place], [place] [address]     Report Status 12/16/2019 FINAL  Final  Culture, respiratory (non-expectorated)     Status: None   Collection Time: 12/11/19 10:27 AM   Specimen: Tracheal Aspirate; Respiratory  Result Value Ref Range Status   Specimen Description TRACHEAL ASPIRATE  Final   Special Requests NONE  Final   Gram Stain NO WBC SEEN NO ORGANISMS SEEN   Final   Culture   Final    FEW PSEUDOMONAS AERUGINOSA FEW STENOTROPHOMONAS MALTOPHILIA SEE SEPARATE REPORT IN [facility] FOR DOS 12/11/19. Performed at [facility], [place] [address] [place], [place] [address]    Report Status 12/26/2019 FINAL  Final   Organism ID, Bacteria PSEUDOMONAS AERUGINOSA  Final   Organism ID, Bacteria STENOTROPHOMONAS MALTOPHILIA  Final      Susceptibility   Pseudomonas aeruginosa - MIC*    CEFTAZIDIME 16 INTERMEDIATE Intermediate     CIPROFLOXACIN >=4 RESISTANT Resistant     GENTAMICIN 2 SENSITIVE Sensitive     IMIPENEM >=16 RESISTANT Resistant     * FEW PSEUDOMONAS AERUGINOSA   Stenotrophomonas maltophilia - MIC*    LEVOFLOXACIN 0.5 SENSITIVE Sensitive     TRIMETH/SULFA <=20 SENSITIVE Sensitive     * FEW STENOTROPHOMONAS MALTOPHILIA  Culture, Urine     Status: Abnormal   Collection Time: 12/11/19 11:30 AM   Specimen: Urine, Random  Result Value Ref Range Status   Specimen Description URINE, RANDOM  Final   Special Requests NONE  Final   Culture (A)  Final    <10,000 COLONIES/mL INSIGNIFICANT GROWTH Performed at [facility], [place] [address] [place], [place] [address]    Report Status 12/12/2019 FINAL  Final  Culture,  blood (routine x 2)     Status: None   Collection Time: 12/15/19 12:37 PM   Specimen: BLOOD LEFT HAND  Result Value Ref Range Status   Specimen Description BLOOD LEFT HAND  Final   Special Requests   Final    BOTTLES DRAWN AEROBIC AND ANAEROBIC Blood Culture adequate volume   Culture   Final    NO GROWTH 5 DAYS Performed at [facility], [address], [place], [place] [address]    Report  Status 12/20/2019 FINAL  Final  Culture, blood (routine x 2)     Status: None   Collection Time: 12/15/19 12:42 PM   Specimen: BLOOD LEFT HAND  Result Value Ref Range Status   Specimen Description BLOOD LEFT HAND  Final   Special Requests   Final    BOTTLES DRAWN AEROBIC AND ANAEROBIC Blood Culture adequate volume   Culture   Final    NO GROWTH 5 DAYS Performed at [facility], [place] [address], [place], [place] [address]    Report Status 12/20/2019 FINAL  Final  C Difficile Quick Screen w PCR reflex     Status: None   Collection Time: 12/17/19  7:00 PM  Result Value Ref Range Status   C Diff antigen NEGATIVE NEGATIVE Final   C Diff toxin NEGATIVE NEGATIVE Final   C Diff interpretation No C. difficile detected.  Final    Comment: Performed at [facility], [place] [address] [place], [place] [address]  SARS CORONAVIRUS 2 (TAT 6-24 HRS) Nasopharyngeal Nasopharyngeal Swab     Status: None   Collection Time: 12/20/19 11:45 AM   Specimen: Nasopharyngeal Swab  Result Value Ref Range Status   SARS Coronavirus 2 NEGATIVE NEGATIVE Final    Comment: (NOTE) SARS-CoV-2 target nucleic acids are NOT DETECTED. The SARS-CoV-2 RNA is generally detectable in upper and lower respiratory specimens during the acute phase of infection. Negative results do not preclude SARS-CoV-2 infection, do not rule out co-infections with other pathogens, and should not be used as the sole basis for treatment or other patient management decisions. Negative results must be combined with clinical observations, patient history, and epidemiological information. The expected result is Negative. Fact Sheet for Patients: SugarRoll.be Fact Sheet for Healthcare Providers: https://www.woods-mathews.com/ This test is not yet approved or cleared by the [place] FDA and  has been authorized for detection and/or diagnosis of SARS-CoV-2 by FDA under an Emergency Use  Authorization (EUA). This EUA will remain  in effect (meaning this test can be used) for the duration of the COVID-19 declaration under Section 56 4(b)(1) of the Act, 21 U.S.C. section 360bbb-3(b)(1), unless the authorization is terminated or revoked sooner. Performed at [facility], [place] [address] [place], [place] [address]   Body fluid culture     Status: None   Collection Time: 12/21/19 12:10 PM   Specimen: Lung, Left; Pleural Fluid  Result Value Ref Range Status   Specimen Description PLEURAL FLUID  Final   Special Requests LEFT LUNG THORA  Final   Gram Stain   Final    RARE WBC PRESENT, PREDOMINANTLY MONONUCLEAR NO ORGANISMS SEEN    Culture   Final    NO GROWTH 3 DAYS Performed at [facility], [place] [address], [place], [place] [address]    Report Status 12/24/2019 FINAL  Final  Culture, blood (routine x 2)     Status: None   Collection Time: 12/25/19  2:16 PM  Specimen: BLOOD RIGHT HAND  Result Value Ref Range Status   Specimen Description BLOOD RIGHT HAND  Final   Special Requests   Final    BOTTLES DRAWN AEROBIC ONLY Blood Culture adequate volume   Culture   Final    NO GROWTH 5 DAYS Performed at [facility], [address] [place], [place] [address]    Report Status 01/01/2020 FINAL  Final  Culture, blood (routine x 2)     Status: None   Collection Time: 12/25/19  2:18 PM   Specimen: BLOOD LEFT HAND  Result Value Ref Range Status   Specimen Description BLOOD LEFT HAND  Final   Special Requests   Final    BOTTLES DRAWN AEROBIC ONLY Blood Culture adequate volume   Culture   Final    NO GROWTH 5 DAYS Performed at [facility], [place] [address], [place], [place] [address]    Report Status 01/01/2020 FINAL  Final  Culture, respiratory (non-expectorated)     Status: None   Collection Time: 12/25/19  4:20 PM   Specimen: Tracheal Aspirate; Respiratory  Result Value Ref Range Status   Specimen Description TRACHEAL ASPIRATE  Final   Special  Requests NONE  Final   Gram Stain   Final    ABUNDANT WBC PRESENT, PREDOMINANTLY PMN NO ORGANISMS SEEN    Culture   Final    RARE Consistent with normal respiratory flora. Performed at [facility], [place] [address] [place], [place] [address]    Report Status 12/28/2019 FINAL  Final  Culture, respiratory (non-expectorated)     Status: None   Collection Time: 01/12/20  4:14 PM   Specimen: Tracheal Aspirate; Respiratory  Result Value Ref Range Status   Specimen Description TRACHEAL ASPIRATE  Final   Special Requests NONE  Final   Gram Stain   Final    ABUNDANT WBC PRESENT, PREDOMINANTLY PMN ABUNDANT GRAM NEGATIVE RODS RARE GRAM POSITIVE RODS Performed at [facility], [place] [address], [place], [place] [address]    Culture   Final    ABUNDANT PSEUDOMONAS AERUGINOSA FEW STENOTROPHOMONAS MALTOPHILIA    Report Status 01/16/2020 FINAL  Final   Organism ID, Bacteria PSEUDOMONAS AERUGINOSA  Final   Organism ID, Bacteria STENOTROPHOMONAS MALTOPHILIA  Final      Susceptibility   Pseudomonas aeruginosa - MIC*    CEFTAZIDIME 16 INTERMEDIATE Intermediate     CIPROFLOXACIN >=4 RESISTANT Resistant     GENTAMICIN 2 SENSITIVE Sensitive     IMIPENEM 2 SENSITIVE Sensitive     * ABUNDANT PSEUDOMONAS AERUGINOSA   Stenotrophomonas maltophilia - MIC*    LEVOFLOXACIN 0.5 SENSITIVE Sensitive     TRIMETH/SULFA <=20 SENSITIVE Sensitive     * FEW STENOTROPHOMONAS MALTOPHILIA  Culture, blood (Routine X 2) w Reflex to ID Panel     Status: None   Collection Time: 01/16/20  3:30 PM   Specimen: BLOOD LEFT HAND  Result Value Ref Range Status   Specimen Description BLOOD LEFT HAND  Final   Special Requests   Final    BOTTLES DRAWN AEROBIC AND ANAEROBIC Blood Culture adequate volume   Culture   Final    NO GROWTH 5 DAYS Performed at [facility], [address], [place], [place] [address]    Report Status 01/21/2020 FINAL  Final  Culture, blood (Routine X 2) w Reflex to ID Panel      Status: None   Collection Time: 01/16/20  3:32 PM  Specimen: BLOOD LEFT ARM  Result Value Ref Range Status   Specimen Description BLOOD LEFT ARM  Final   Special Requests   Final    BOTTLES DRAWN AEROBIC AND ANAEROBIC Blood Culture adequate volume   Culture   Final    NO GROWTH 5 DAYS Performed at [facility], [address] [place], [place] [address]    Report Status 01/21/2020 FINAL  Final  Culture, respiratory (non-expectorated)     Status: None   Collection Time: 02/02/20  6:30 PM   Specimen: Tracheal Aspirate; Respiratory  Result Value Ref Range Status   Specimen Description TRACHEAL ASPIRATE  Final   Special Requests NONE  Final   Gram Stain   Final    ABUNDANT WBC PRESENT, PREDOMINANTLY PMN ABUNDANT GRAM NEGATIVE RODS Performed at [facility], [address], [place], [place] [address]    Culture ABUNDANT PSEUDOMONAS AERUGINOSA  Final   Report Status 02/05/2020 FINAL  Final   Organism ID, Bacteria PSEUDOMONAS AERUGINOSA  Final      Susceptibility   Pseudomonas aeruginosa - MIC*    CEFTAZIDIME 16 INTERMEDIATE Intermediate     CIPROFLOXACIN >=4 RESISTANT Resistant     GENTAMICIN 4 SENSITIVE Sensitive     IMIPENEM >=16 RESISTANT Resistant     CEFEPIME INTERMEDIATE Intermediate     * ABUNDANT PSEUDOMONAS AERUGINOSA    Coagulation Studies: No results for input(s): LABPROT, INR in the last 72 hours.  Urinalysis: No results for input(s): COLORURINE, LABSPEC, PHURINE, GLUCOSEU, HGBUR, BILIRUBINUR, KETONESUR, PROTEINUR, UROBILINOGEN, NITRITE, LEUKOCYTESUR in the last 72 hours.  Invalid input(s): APPERANCEUR    Imaging: No results found.   Medications:     heparin sodium (porcine), lidocaine, lidocaine  Assessment/ Plan:  74 y.o. male with a PMHx of acute respiratory failure status post COVID-19 infection status post tracheostomy placement, atrial fibrillation, hypertension, CVA, gout, hypertrophic cardiomyopathy, who was admitted to [facility] on 11/21/2019 for ongoing treatment of acute respiratory failure.  1.  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   -We plan to maintain the patient on MWF dialysis schedule therefore he is due for dialysis treatment today.  2.  Acute respiratory failure secondary to COVID-19 infection and its sequela.  -Patient continues to have mild tachypnea.  Bloody secretions noted from tracheostomy as well.  ENT has been consulted.  3.  Hyperkalemia.  Resolved.  4.  Hyponatremia.  Sodium stable at 138.  Continue dialysis to maintain water and sodium balance.  5.  Anemia of chronic kidney disease.  Despite mild bleeding from tracheostomy hemoglobin remained stable at 8.4.  Continue to monitor CBC.       LOS: 0 Shane Banks 5/19/202111:08 AM

## 2020-02-14 NOTE — Procedures (Signed)
Bronchoscopy Procedure Note Shane Banks [id number] 1946-08-17  Procedure: Bronchoscopy Indications: hemoptysis  Procedure Details Consent: Unable to obtain consent because of emergent medical necessity. Time Out: Verified patient identification, verified procedure, site/side was marked, verified correct patient position, special equipment/implants available, medications/allergies/relevent history reviewed, required imaging and test results available.  Performed  In preparation for procedure, patient was given 100% FiO2 and bronchoscope lubricated. Sedation: Versed 2 mg  Bronchoscope introduced via 6.0 cuffed tracheostomy.  There was forming clot around the trach at the stoma site but no obvious ulceration or pulsing bleeding noted.  There were no endobronchial lesions, there was no mucus.  The patient did have thin fresh bright red blood that was pooling in all airways, lower lobe greater than upper lobe and middle lobe.  The blood was easily suctioned and appeared to clear.  Serial BAL was performed in the anterior segment of the right upper lobe to assess for possible DAH.  Again there was good clearance with suctioning, no evidence for focal airway bleed or DAH.  At the end of procedure I pooled the BAL sample so that they could be sent for culture.  Patient tolerated the procedure well, was placed back on mechanical ventilation at the end of the procedure.     Procedures performed: Serial BAL from the anterior segment of the right upper lobe   Evaluation Hemodynamic Status: BP stable throughout; O2 sats: transiently fell during during procedure Patient's Current Condition: stable Specimens: Pooled samples right upper lobe BAL Complications: No apparent complications Patient did tolerate procedure well.   Baltazar Apo, MD, PhD 02/14/2020, 5:15 PM [facility] [phone] or if no answer [phone]

## 2020-02-14 NOTE — Progress Notes (Signed)
Pharmacy Antibiotic Note  Shane Banks is a 74 y.o. male admitted on 02/14/2020 with pneumonia.  Pharmacy has been consulted for Cefepime and Vancomycin dosing.   Height: 5\' 9"  (175.3 cm) Weight: 65 kg (143 lb 4.8 oz) IBW/kg (Calculated) : 70.7  No data recorded.  Recent Labs  Lab 02/08/20 0759 02/09/20 0500 02/12/20 0650 02/13/20 0521 02/14/20 0556 02/14/20 1433  WBC  --  15.3* 17.5* 15.8* 16.4* 16.1*  CREATININE 1.30* 1.25* 2.68*  --  2.50* 1.52*    Estimated Creatinine Clearance: 40.4 mL/min (A) (by C-G formula based on SCr of 1.52 mg/dL (H)).    Not on File  Antimicrobials this admission: 5/19 Cefepime >>  5/19 Vancomycin >>   Dose adjustments this admission: N/a  Microbiology results: Pending   Plan:  - Cefepime 2g IV q12h - Vancomycin 1500mg  IV x 1 dose  - Followed by Vancomycin 1000mg  IV q24h  - Monitor patients renal function and urine output  - De-escalate ABX when appropriate   Thank you for allowing pharmacy to be a part of this patient's care.  Duanne Limerick PharmD. BCPS 02/14/2020 3:33 PM

## 2020-02-14 NOTE — Progress Notes (Signed)
Contacted [facility] for ST elevation, received order for ECG per Dr. Lucile Shutters.

## 2020-02-14 NOTE — Consult Note (Signed)
Reason for Consult: Bleeding tracheostomy Referring Physician: Davonna Belling, MD  Shane Banks is an 74 y.o. male.  HPI: Brought into the emergency department earlier today with bleeding from the tracheostomy.  I have no available history as the patient is not able to provide any history himself.  I do not know how long he has had the tracheostomy.  Past Medical History:  Diagnosis Date  . Acute on chronic respiratory failure with hypoxia ([place])   . Atrial fibrillation with RVR ([place])   . BPH (benign prostatic hyperplasia)   . COVID-19 virus infection   . Pneumonia due to COVID-19 virus   . Severe sepsis ([place])   . Stroke [facility])     Past Surgical History:  Procedure Laterality Date  . IR FLUORO GUIDE CV LINE RIGHT  12/15/2019  . IR THORACENTESIS ASP PLEURAL SPACE W/IMG GUIDE  12/21/2019  . IR US GUIDE VASC ACCESS RIGHT  12/15/2019    No family history on file.  Social History:  has no history on file for tobacco, alcohol, and drug.  Allergies: Not on File  Medications: Reviewed  Results for orders placed or performed during the hospital encounter of 02/14/20 (from the past 48 hour(s))  Comprehensive metabolic panel     Status: Abnormal   Collection Time: 02/14/20  2:33 PM  Result Value Ref Range   Sodium 140 135 - 145 mmol/L   Potassium 3.6 3.5 - 5.1 mmol/L   Chloride 101 98 - 111 mmol/L   CO2 27 22 - 32 mmol/L   Glucose, Bld 180 (H) 70 - 99 mg/dL    Comment: Glucose reference range applies only to samples taken after fasting for at least 8 hours.   BUN 63 (H) 8 - 23 mg/dL   Creatinine, Ser 1.52 (H) 0.61 - 1.24 mg/dL   Calcium 9.9 8.9 - 10.3 mg/dL   Total Protein 8.3 (H) 6.5 - 8.1 g/dL   Albumin 3.4 (L) 3.5 - 5.0 g/dL   AST 29 15 - 41 U/L   ALT 24 0 - 44 U/L   Alkaline Phosphatase 116 38 - 126 U/L   Total Bilirubin 1.1 0.3 - 1.2 mg/dL   GFR calc non Af Amer 45 (L) >60 mL/min   GFR calc Af Amer 52 (L) >60 mL/min   Anion gap 12 5 - 15    Comment: Performed at [facility] [address], [place], [place] [address]  CBC with Differential     Status: Abnormal   Collection Time: 02/14/20  2:33 PM  Result Value Ref Range   WBC 16.1 (H) 4.0 - 10.5 K/uL   RBC 2.88 (L) 4.22 - 5.81 MIL/uL   Hemoglobin 7.7 (L) 13.0 - 17.0 g/dL   HCT 24.7 (L) 39.0 - 52.0 %   MCV 85.8 80.0 - 100.0 fL   MCH 26.7 26.0 - 34.0 pg   MCHC 31.2 30.0 - 36.0 g/dL   RDW 18.4 (H) 11.5 - 15.5 %   Platelets 128 (L) 150 - 400 K/uL    Comment: SPECIMEN CHECKED FOR CLOTS REPEATED TO VERIFY    nRBC 0.0 0.0 - 0.2 %   Neutrophils Relative % 75 %   Neutro Abs 12.2 (H) 1.7 - 7.7 K/uL   Lymphocytes Relative 12 %   Lymphs Abs 1.9 0.7 - 4.0 K/uL   Monocytes Relative 8 %   Monocytes Absolute 1.2 (H) 0.1 - 1.0 K/uL   Eosinophils Relative 3 %   Eosinophils Absolute 0.5 0.0 -  0.5 K/uL   Basophils Relative 0 %   Basophils Absolute 0.1 0.0 - 0.1 K/uL   Immature Granulocytes 2 %   Abs Immature Granulocytes 0.25 (H) 0.00 - 0.07 K/uL    Comment: Performed at [facility], [place] [address] [place], [place] [address]  Protime-INR     Status: None   Collection Time: 02/14/20  2:33 PM  Result Value Ref Range   Prothrombin Time 15.2 11.4 - 15.2 seconds   INR 1.2 0.8 - 1.2    Comment: (NOTE) INR goal varies based on device and disease states. Performed at [facility], [place] [address], [place], [place] [address]   Type and screen [place]     Status: None   Collection Time: 02/14/20  2:33 PM  Result Value Ref Range   ABO/RH(D) O POS    Antibody Screen NEG    Sample Expiration      02/17/2020,2359 Performed at [facility], [place] [address], [place], [place] [address]   Lactic acid, plasma     Status: None   Collection Time: 02/14/20  2:57 PM  Result Value Ref Range   Lactic Acid, Venous 1.2 0.5 - 1.9 mmol/L    Comment: Performed at [facility] [address] [place], [place] [address]    DG Chest Port 1 View  Result Date:  02/14/2020 CLINICAL DATA:  Hemoptysis. EXAM: PORTABLE CHEST 1 VIEW COMPARISON:  Feb 02, 2020. FINDINGS: Stable cardiomegaly with central pulmonary vascular congestion. Tracheostomy tube is unchanged in position. Right internal jugular dialysis catheter is noted with tip in expected position of right atrium. No pneumothorax is noted. Stable bilateral lung opacities are noted concerning for either edema or multifocal pneumonia, with probable small bilateral pleural effusions. Bony thorax is unremarkable. IMPRESSION: Stable cardiomegaly with central pulmonary vascular congestion. Stable bilateral lung opacities are noted concerning for either edema or multifocal pneumonia, with probable small bilateral pleural effusions. Aortic Atherosclerosis (ICD10-I70.0). Electronically Signed   By: Marijo Conception M.D.   On: 02/14/2020 14:47    NGE:[id number] except as listed in admit H&P  Blood pressure 118/63, pulse (!) 104, resp. rate (!) 22, height _0  (1.753 m), weight 65 kg, SpO2 90 %.  PHYSICAL EXAM: Overall appearance: Thin elderly gentleman, he is able to phonate and does not seem to be breathing through the tracheostomy.  He is coughing up bloody secretions frequently. Head:  Normocephalic, atraumatic. Ears: External ears look healthy. Nose: External nose is healthy in appearance. Internal nasal exam free of any lesions or obstruction. Oral Cavity/Pharynx: Diffusely dry mucous membranes with blood staining.  There does not appear to be active bleeding in the oral cavity oropharynx although he does cough up bloody secretions frequently. Larynx/Hypopharynx: Deferred Neuro: Unable to evaluate. Neck: No palpable neck masses.  Tracheostomy is in place.  There is extensive bloody secretions from the lumen and around the tracheostomy tube.  I was able to suction some bloody secretions with a suction cannula.  I was unable to pass the fiberoptic scope beyond the distal end of the tracheostomy tube as there was a  large blood clot present.  The tracheostomy was removed and replaced with a fresh cuffed #6.  The prior one was uncuffed.  When I removed the original tracheostomy there is a large blood clot adhering to the distal tip which I was able to remove from the tracheostomy site.  When I passed the new tracheostomy tube without  difficulty and secured in place I was able to perform fiberoptic exam down to the carina.  There is no obvious bleeding arising from above the tracheostomy draining into the trachea.  I was able to remove the tracheostomy tube about 2 cm and did not see any erosion of the tracheal wall.  It did appear to be some bloody secretions in the mainstem bronchi bilaterally.  Studies Reviewed: none  Procedures: Tracheostomy change and fiberoptic examination described above.   Assessment/Plan: Airway bleeding.  I change the tracheostomy and did not appreciate any significant bleeding from the tracheal wall.  There does not appear to be an erosion.  There may be a pulmonic process.  Recommend pulmonary perform flexible bronchoscopy and deeper suctioning to evaluate the more distal bronchi.  I will keep the tracheostomy inflated.  He was ventilating nicely through the tracheostomy after the change and active blood clot was removed.  Izora Gala 02/14/2020, 3:43 PM

## 2020-02-14 NOTE — Progress Notes (Signed)
Patients trach bleeding. Trach care performed, but still oozing blood. Patient was suctioned with bright red blood in return. Gauze packed around site until bleeding stops.

## 2020-02-15 ENCOUNTER — Inpatient Hospital Stay (HOSPITAL_COMMUNITY): Payer: Medicare HMO

## 2020-02-15 LAB — CBC
HCT: 21.5 % — ABNORMAL LOW (ref 39.0–52.0)
Hemoglobin: 6.7 g/dL — CL (ref 13.0–17.0)
MCH: 26.6 pg (ref 26.0–34.0)
MCHC: 31.2 g/dL (ref 30.0–36.0)
MCV: 85.3 fL (ref 80.0–100.0)
Platelets: 128 10*3/uL — ABNORMAL LOW (ref 150–400)
RBC: 2.52 MIL/uL — ABNORMAL LOW (ref 4.22–5.81)
RDW: 18.4 % — ABNORMAL HIGH (ref 11.5–15.5)
WBC: 17.5 10*3/uL — ABNORMAL HIGH (ref 4.0–10.5)
nRBC: 0 % (ref 0.0–0.2)

## 2020-02-15 LAB — BASIC METABOLIC PANEL
Anion gap: 12 (ref 5–15)
BUN: 78 mg/dL — ABNORMAL HIGH (ref 8–23)
CO2: 27 mmol/L (ref 22–32)
Calcium: 10.1 mg/dL (ref 8.9–10.3)
Chloride: 101 mmol/L (ref 98–111)
Creatinine, Ser: 2.09 mg/dL — ABNORMAL HIGH (ref 0.61–1.24)
GFR calc Af Amer: 36 mL/min — ABNORMAL LOW (ref >60)
GFR calc non Af Amer: 31 mL/min — ABNORMAL LOW (ref >60)
Glucose, Bld: 102 mg/dL — ABNORMAL HIGH (ref 70–99)
Potassium: 3.7 mmol/L (ref 3.5–5.1)
Sodium: 140 mmol/L (ref 135–145)

## 2020-02-15 LAB — PHOSPHORUS
Phosphorus: 1.4 mg/dL — ABNORMAL LOW (ref 2.5–4.6)
Phosphorus: 4.6 mg/dL (ref 2.5–4.6)

## 2020-02-15 LAB — GLUCOSE, CAPILLARY
Glucose-Capillary: 101 mg/dL — ABNORMAL HIGH (ref 70–99)
Glucose-Capillary: 95 mg/dL (ref 70–99)
Glucose-Capillary: 100 mg/dL — ABNORMAL HIGH (ref 70–99)
Glucose-Capillary: 92 mg/dL (ref 70–99)
Glucose-Capillary: 89 mg/dL (ref 70–99)
Glucose-Capillary: 76 mg/dL (ref 70–99)

## 2020-02-15 LAB — PREPARE RBC (CROSSMATCH)

## 2020-02-15 LAB — MAGNESIUM: Magnesium: 1.8 mg/dL (ref 1.7–2.4)

## 2020-02-15 MED ORDER — SODIUM CHLORIDE 0.9% IV SOLUTION: Freq: Once | INTRAVENOUS | Status: AC

## 2020-02-15 MED ORDER — ORAL CARE MOUTH RINSE
15.0000 mL | OROMUCOSAL | Status: DC
  Administered 2020-02-15 – 2020-03-23 (×354): 15 mL via OROMUCOSAL

## 2020-02-15 MED ORDER — POTASSIUM PHOSPHATES 15 MMOLE/5ML IV SOLN
45.0000 mmol | Freq: Once | INTRAVENOUS | Status: AC
  Administered 2020-02-15: 45 mmol via INTRAVENOUS
  Filled 2020-02-15: qty 15

## 2020-02-15 MED ORDER — CHLORHEXIDINE GLUCONATE CLOTH 2 % EX PADS
6.0000 | MEDICATED_PAD | Freq: Every day | CUTANEOUS | Status: DC
  Administered 2020-02-16 – 2020-02-18 (×2): 6 via TOPICAL

## 2020-02-15 MED ORDER — MAGNESIUM SULFATE 2 GM/50ML IV SOLN
2.0000 g | Freq: Once | INTRAVENOUS | Status: AC
  Administered 2020-02-15: 2 g via INTRAVENOUS
  Filled 2020-02-15: qty 50

## 2020-02-15 MED ORDER — CHLORHEXIDINE GLUCONATE 0.12% ORAL RINSE (MEDLINE KIT)
15.0000 mL | Freq: Two times a day (BID) | OROMUCOSAL | Status: DC
  Administered 2020-02-15 – 2020-04-21 (×116): 15 mL via OROMUCOSAL

## 2020-02-15 MED ORDER — CHLORHEXIDINE GLUCONATE CLOTH 2 % EX PADS
6.0000 | MEDICATED_PAD | Freq: Every day | CUTANEOUS | Status: DC
  Administered 2020-02-16 – 2020-02-18 (×3): 6 via TOPICAL

## 2020-02-15 NOTE — Consult Note (Signed)
Renal Service Consult Note [facility]  Shane Banks 02/15/2020 [place] Requesting Physician: Dr Vaughan Browner  Reason for Consult:  ESRD pt admit from [facility] for bleeding around tracheostomy HPI: The patient is a 74 y.o. year-old w/ hx of CVA, BPH, atrial fib and COVID+ PNA admitted to [facility] in March 2021.  Pt had severe lung issues on the ventilator and required tracheostomy and was dc'd to [facility] in [place] on 11/21/19.  While at [facility] pt had UTI, MDRO pseudomonas PNA and stenotrophomonas bacteremia. He was on gentamicin and developed AKI and was started on dialysis around 12/16/19. Pt has been on HD since. On 5/18 the pt developed hemoptysis/ bleeding from around his trach and stoma, this worsened on 5/19 and pt was sent to ED for evaluation. In ED he was put on the ventilator. CXR showed unchanged bilat infiltrates. Pt was admitted to ICU at [facility].  Bronchoscopy was done.  Today is on trach collar and bleeding has slowed. Had shortened HD yest around 1 hr.  Asked to see for ESRD.    Pt is not responding in ICU, confused and nonverbal.     ROS n/a   Past Medical History  Past Medical History:  Diagnosis Date  . Acute on chronic respiratory failure with hypoxia ([place])   . Atrial fibrillation with RVR ([place])   . BPH (benign prostatic hyperplasia)   . COVID-19 virus infection   . Pneumonia due to COVID-19 virus   . Severe sepsis ([place])   . Stroke [facility])    Past Surgical History  Past Surgical History:  Procedure Laterality Date  . IR FLUORO GUIDE CV LINE RIGHT  12/15/2019  . IR THORACENTESIS ASP PLEURAL SPACE W/IMG GUIDE  12/21/2019  . IR US GUIDE VASC ACCESS RIGHT  12/15/2019   Family History No family history on file. Social History  has no history on file for tobacco, alcohol, and drug. Allergies  Allergies  Allergen Reactions  . Aspirin Rash  . Cephalexin Other (See Comments)    dizziness  . Penicillins      Itching,swelling    Home medications Prior to Admission medications   Medication Sig Start Date End Date Taking? Authorizing Provider  amLODipine (NORVASC) 10 MG tablet Take 10 mg by mouth daily.   Yes [provider]  ascorbic acid (VITAMIN C) 500 MG tablet Take 500 mg by mouth 2 (two) times daily.   Yes [provider]  cholecalciferol (VITAMIN D3) 25 MCG (1000 UNIT) tablet Take 1,000 Units by mouth daily.   Yes [provider]  guaifenesin (HUMIBID E) 400 MG TABS tablet Take 400 mg by mouth in the morning, at noon, and at bedtime.   Yes [provider]  levothyroxine (SYNTHROID) 25 MCG tablet Take 25 mcg by mouth daily before breakfast.   Yes [provider]  melatonin 3 MG TABS tablet Take 3 mg by mouth at bedtime.   Yes [provider]  nutrition supplement, JUVEN, (JUVEN) PACK Take 1 packet by mouth in the morning, at noon, and at bedtime.   Yes [provider]  Omega-3 Fatty Acids (FISH OIL) 1000 MG CAPS Take 1 capsule by mouth daily.   Yes [provider]  pantoprazole (PROTONIX) 40 MG tablet Take 40 mg by mouth 2 (two) times daily before a meal.   Yes [provider]  sertraline (ZOLOFT) 25 MG tablet Take 25 mg by mouth daily.   Yes [provider]  Specialty Vitamins Products (PROSTATE PO) Take 30 mLs by mouth daily.   Yes [provider]  tamsulosin (FLOMAX) 0.4 MG CAPS capsule Take 0.4 mg by mouth daily.   Yes [provider]     Vitals:   02/15/20 1000 02/15/20 1030 02/15/20 1143 02/15/20 1158  BP: 112/64 (!) 112/52  114/62  Pulse: 94 97  (!) 101  Resp: (!) 31 (!) 30  (!) 30  Temp:  99.5 F (37.5 C) 99 F (37.2 C)   TempSrc:  Axillary Oral   SpO2: 100% 100%  100%  Weight:      Height:       Exam Gen elderly AAM, eyes open slightly but not making contact, does not follow commands, weak and debilitated No rash, cyanosis or gangrene Trach w/ some blood-stained  gauzes at base  No jvd or bruits Chest clear to A/P ant and lat RRR bounding precordium, no sig MRG Abd soft ntnd no mass or ascites +bs, +LLQ ostomy, +PEG tube GU normal male  MS no joint effusions or deformity Ext 1+ pretib edema and 1-2+ dependent hip edema Feet in boots Neuro is as above    Home meds:  - norvasc 10  - flomax 0.4/ PPI/ synthroid   - zoloft 25 qd  - prn's/ vitamins/ supplements   CXR - diffuse bilat infiltrates or scarring  Assessment/ Plan: 1. Hemoptysis - in pt w/ trach at [facility], admitted here for bleeding from trach site. Per CCM.  2. Acute/chronic resp failure - sp COVID pna earlier this year, also course complicated by multiple PNA's. Sp trach.  3. 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 AKI due to gentamicin. No need for HD today. Plan HD tomorrow. Get volume down.  4. BP/ volume - sig pitting leg edema bilat. UF 2-3 L w HD tomorrow 5. Atrial fib - per primary 6. Anemia ckd / abl - Hb 6.7, has received 2u prbc's today.   7. Nutrition - on PEG feeds, has colostomy 8. Sacral decub - per primary, WOC 9. SP COVID +pna - earlier this year      Rob Jonnie Finner  MD 02/15/2020, 12:18 PM  Recent Labs  Lab 02/14/20 1433 02/15/20 0501  WBC 16.1* 17.5*  HGB 7.7* 6.7*   Recent Labs  Lab 02/14/20 0556 02/14/20 0556 02/14/20 1433 02/15/20 0501  K 3.5   < > 3.6 3.7  BUN 121*   < > 63* 78*  CREATININE 2.50*   < > 1.52* 2.09*  CALCIUM 10.8*   < > 9.9 10.1  PHOS 2.3*  --   --  1.4*   < > = values in this interval not displayed.

## 2020-02-15 NOTE — Progress Notes (Signed)
Phos-1.4, Mg. 1.8- replaced per elink electrolyte protocol.  Hgb-6.7- Dr. Lucile Shutters notified through sharepoint.

## 2020-02-15 NOTE — Progress Notes (Signed)
Pt was placed on trach collar 35%/8L. Pt is tolerating well at this time. Rt will continue to monitor.

## 2020-02-15 NOTE — Progress Notes (Addendum)
NAME:  Shane Banks, MRN:  [id number], DOB:  [birth year], LOS: 1 ADMISSION DATE:  02/14/2020, CONSULTATION DATE:  5/19 REFERRING MD:  Dr. Alvino Chapel, CHIEF COMPLAINT:  Hemoptysis   Brief History   74 year old male. COVID ARDS in January. Unfortunately required trach and LTACH placement. Had been tolerating trach collar, but on 5/19 he developed hemoptysis and hypoxemic respiratory failure requiring vent.   History of present illness   74 year old male with past medical history as below, which is significant for Atrial fibrillation and stroke. He was admitted to [facility] for [place] 19 pneumonia, which unfortunately progressed to ARDS.  It seems as though his highest vent settings were 60% FiO2 and 8 of PEEP.  He had very little improvement in his ventilatory and oxygenation use over the course of his hospitalization and ultimately underwent tracheostomy and [facility] [place] [facility]. His course at [facility] was complicated by ongoing infections including UTI, MDRO pseudomonas pneumonia, and stenotrophomonas bacteremia. As a result of sepsis and shock he developed renal failure requiring dialysis, which he now receives M/W/F. In the morning hours oh 5/19 he developed hemoptysis. He had previously been taken off anticoagulation (for AF) due to anemia with positive hemoccult. Transferred to [facility] ED for evaluation of hemoptysis. Prior to the transfer he had been tolerating 28% ATC.   Past Medical History   has a past medical history of Acute on chronic respiratory failure with hypoxia ([place]), Atrial fibrillation with RVR ([place]), BPH (benign prostatic hyperplasia), COVID-19 virus infection, Pneumonia due to COVID-19 virus, Severe sepsis ([place]), and Stroke ([place]).   Significant Hospital Events   1/8 admit to [facility] for COVID PNA 2/23 transfer to [facility] on vent 5/19 transfer to [facility] for hemoptysis. #6 cuffed trach placed. Back on  vent. Bronch performed  Consults:  ENT  Procedures:  ENT flex scope 5/19 > no bleeding source identified FOB 5/19 > no pulmonary bleeding source identified.   Significant Diagnostic Tests:  CXR 5/20 >> patchy bilateral infiltrates on left.   Micro Data:  BAL 5/19 >  Antimicrobials:   Had issues with cuff leak overnight and low TVs on vent More stable today Has ongoing slight ooze from trach site  Interim history/subjective:    Objective   Blood pressure 109/74, pulse 99, temperature 98.6 F (37 C), temperature source Oral, resp. rate (!) 22, height _0  (1.753 m), weight 62.8 kg, SpO2 99 %.    Vent Mode: PRVC FiO2 (%):  [40 %-100 %] 40 % Set Rate:  [18 bmp] 18 bmp Vt Set:  [560 mL] 560 mL PEEP:  [5 cmH20] 5 cmH20 Plateau Pressure:  [21 cmH20] 21 cmH20   Intake/Output Summary (Last 24 hours) at 02/15/2020 4481 Last data filed at 02/15/2020 0645 Gross per 24 hour  Intake 110 ml  Output --  Net 110 ml   Filed Weights   02/14/20 1516 02/15/20 0500  Weight: 65 kg 62.8 kg    Examination: Gen:      Frail, elderly HEENT:  EOMI, sclera anicteric Neck:     No masses; no thyromegaly, trach Lungs:    Clear to auscultation bilaterally; normal respiratory effort CV:         Regular rate and rhythm; no murmurs Abd:      + bowel sounds; soft, non-tender; no palpable masses, no distension Ext:    No edema; adequate peripheral perfusion Skin:  Warm and dry; no rash Neuro: Somnolent, does not respond to stimuli  Labs significant for BUN/creatinine 78/2.09, WBC 17.5, hemoglobin 6.7  Resolved Hospital Problem list     Assessment & Plan:   Acute hypoxemic respiratory failure: he has a trach s/p COVID infection earlier this year. His oxygenation had  improved to tolerate 28% ATC. Course since complicated by multiple pneumonias, which is seems he has complete tx for (MDRO pseudomonas treated with inhaled tobramycin). Now he has had hemoptysis and aspirated blood.  Decompensated in ER in this setting. "Hemoptysis" seems to be originating from stoma. Ulceration?  Has a cuffed trach placed by ENT.  No active bleeding from the blood on bronchoscopy Follow BAL cultures.  Holding antibiotics We will transition to trach collar.  If he ends up needing vent for prolonged.  Then will ask ENT to change to XLT. We will hold off on CTA chest as suspicion for PE is low If bleeding persists consider ENT re-eval. May benefit from DDAVP ans is mildly uremic.   Low Hb Secondary to blood loss Transfuse PRBC  ESRD on HD:  Will consult nephrology  Atrial fibrillation: ST here in ED. Amiodarone stopped at [facility] due to bradycardia, anticoagulation stopped due to suspected GIB with several transfusions.  - Telemetry monitoring.   Nutrition: TF via PEG  Sacral decub: WOC consult   Best practice:  Diet: TF Pain/Anxiety/Delirium protocol (if indicated): PRN sedation for vent complaince VAP protocol (if indicated): Per protocol DVT prophylaxis: SCD GI prophylaxis: PPI Glucose control: SSI Mobility: BR Code Status: DNR Family Communication: Sister updated via phone 5/19, pending 5/20 Disposition: ICU on vent.   Labs   CBC: Recent Labs  Lab 02/12/20 0650 02/13/20 0521 02/14/20 0556 02/14/20 1433 02/15/20 0501  WBC 17.5* 15.8* 16.4* 16.1* 17.5*  NEUTROABS  --   --   --  12.2*  --   HGB 8.2* 8.6* 8.4* 7.7* 6.7*  HCT 25.6* 26.7* 25.9* 24.7* 21.5*  MCV 84.5 83.2 84.1 85.8 85.3  PLT 163 151 163 128* 128*    Basic Metabolic Panel: Recent Labs  Lab 02/09/20 0500 02/12/20 0650 02/14/20 0556 02/14/20 1433 02/15/20 0501  NA 138 138 138 140 140  K 3.6 4.0 3.5 3.6 3.7  CL 99 99 97* 101 101  CO2 _0 GLUCOSE 102* 139* 135* 180* 102*  BUN 68* 161* 121* 63* 78*  CREATININE 1.25* 2.68* 2.50* 1.52* 2.09*  CALCIUM 10.0 10.7* 10.8* 9.9 10.1  MG  --   --   --   --  1.8  PHOS 2.1* 3.1 2.3*  --  1.4*   GFR: Estimated Creatinine Clearance: 28.4  mL/min (A) (by C-G formula based on SCr of 2.09 mg/dL (H)). Recent Labs  Lab 02/13/20 0521 02/14/20 0556 02/14/20 1433 02/14/20 1457 02/15/20 0501  WBC 15.8* 16.4* 16.1*  --  17.5*  LATICACIDVEN  --   --   --  1.2  --     Liver Function Tests: Recent Labs  Lab 02/09/20 0500 02/12/20 0650 02/14/20 0556 02/14/20 1433  AST  --   --   --  29  ALT  --   --   --  24  ALKPHOS  --   --   --  116  BILITOT  --   --   --  1.1  PROT  --   --   --  8.3*  ALBUMIN 3.2* 2.4* 2.8* 3.4*   No results for input(s): LIPASE,  AMYLASE in the last 168 hours. No results for input(s): AMMONIA in the last 168 hours.  ABG    Component Value Date/Time   PHART 7.499 (H) 02/09/2020 2143   PCO2ART 35.5 02/09/2020 2143   PO2ART 64.9 (L) 02/09/2020 2143   HCO3 27.4 02/09/2020 2143   ACIDBASEDEF 0.4 12/17/2019 1809   O2SAT 96.1 02/09/2020 2143     Coagulation Profile: Recent Labs  Lab 02/14/20 1433  INR 1.2    Cardiac Enzymes: No results for input(s): CKTOTAL, CKMB, CKMBINDEX, TROPONINI in the last 168 hours.  HbA1C: Hgb A1c MFr Bld  Date/Time Value Ref Range Status  11/30/2019 06:56 AM 6.0 (H) 4.8 - 5.6 % Final    Comment:    (NOTE) Pre diabetes:          5.7%-6.4% Diabetes:              >6.4% Glycemic control for   <7.0% adults with diabetes     CBG: Recent Labs  Lab 02/14/20 1935 02/14/20 2319 02/15/20 0321 02/15/20 0729  GLUCAP 126* 114* 101* 95    Review of Systems:   Patient is encephalopathic and/or intubated. Therefore history has been obtained from chart review.   Past Medical History  He,  has a past medical history of Acute on chronic respiratory failure with hypoxia ([place]), Atrial fibrillation with RVR ([place]), BPH (benign prostatic hyperplasia), COVID-19 virus infection, Pneumonia due to COVID-19 virus, Severe sepsis ([place]), and Stroke ([place]).   Surgical History    Past Surgical History:  Procedure Laterality Date  . IR FLUORO GUIDE CV LINE RIGHT  12/15/2019  .  IR THORACENTESIS ASP PLEURAL SPACE W/IMG GUIDE  12/21/2019  . IR US GUIDE VASC ACCESS RIGHT  12/15/2019     Social History      Family History   His family history is not on file.   Allergies Allergies  Allergen Reactions  . Aspirin Rash  . Cephalexin Other (See Comments)    dizziness  . Penicillins     Itching,swelling      Home Medications  Prior to Admission medications   Not on File     Critical care time:45 mins   The patient is critically ill with multiple organ system failure and requires high complexity decision making for assessment and support, frequent evaluation and titration of therapies, advanced monitoring, review of radiographic studies and interpretation of complex data.   Critical Care Time devoted to patient care services, exclusive of separately billable procedures, described in this note is 35 minutes.   Marshell Garfinkel MD [facility] Please see [email or web address] for pager details.  02/15/2020, 8:51 AM

## 2020-02-15 NOTE — Plan of Care (Signed)
Called to bedside for cuff leak. With leak pt is tachypneic 40-50 with very low tidal volumes 80-100cc and ABG 7.41/49/313. Intermittently requires inflation to high pressures to prevent leak. Primary team may elect to discuss with ENT for XLT or larger trach depending on expected trajectory in the AM.

## 2020-02-15 NOTE — Progress Notes (Signed)
ABG results did not cross over from I-stat, results shown to MD Meier.  Results are as follows:  PH: 7.41 Co2: 49 PO2: 313 HCO3: 33 SO2: 100

## 2020-02-15 NOTE — Progress Notes (Signed)
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  GOAL:   Patient will meet greater than or equal to 90% of their needs  MONITOR:   Labs, I & O's, Skin  REASON FOR ASSESSMENT:   Ventilator, Other (Comment)(hx PEG)    ASSESSMENT:   74 yo male admitted with hemoptysis. 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   Discussed patient in ICU rounds and with RN today. Currently on trach collar. PEG in place. Plans to hold on TF today due to bleeding.   Labs reviewed. BUN 78 (H), Creat 2.09 (H), phos 1.4 (L) CBG: 101-95  Medications reviewed and include colace, miralax, mag sulfate, potassium phosphate.  No recent weights available for review.     NUTRITION - FOCUSED PHYSICAL EXAM:    Most Recent Value  Orbital Region  Severe depletion  Upper Arm Region  Mild depletion  Thoracic and Lumbar Region  Moderate depletion  Buccal Region  Severe depletion  Temple Region  Severe depletion  Clavicle Bone Region  Moderate depletion  Clavicle and Acromion Bone Region  Moderate depletion  Scapular Bone Region  Unable to assess  Dorsal Hand  Moderate depletion  Patellar Region  Severe depletion  Anterior Thigh Region  Severe depletion  Posterior Calf Region  Unable to assess  Edema (RD Assessment)  Unable to assess  Hair  Reviewed  Eyes  Reviewed  Mouth  Unable to assess  Skin  Reviewed  Nails  Reviewed       Diet Order:   Diet Order            Diet NPO time specified  Diet  effective now              EDUCATION NEEDS:   No education needs have been identified at this time  Skin:  Skin Assessment: Skin Integrity Issues: Skin Integrity Issues:: Stage IV, DTI DTI: L hip Stage IV: sacrum    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  Estimated Nutritional Needs:   Kcal:  1900-2200  Protein:  100-125 gm  Fluid:  1 L + UOP    Lucas Mallow, RD, LDN, CNSC Please refer to Amion for contact information.

## 2020-02-15 NOTE — Progress Notes (Signed)
[facility] Progress Note Patient Name: Shane Banks DOB: March 25, 1946 MRN: [id number]   Date of Service  02/15/2020  HPI/Events of Note  Hemoglobin 6.7 gm  eICU Interventions  Order entered to transfuse 2 units PRBC.        [name] [name] 02/15/2020, 6:15 AM

## 2020-02-15 NOTE — Plan of Care (Signed)
TCT Marthe Patch (sister/POA) @ [phone]; obtained consent to transfuse 2u PRBC.  Risks & benefits explained; questions answered. Stated usual HD schedule is M, W, F.

## 2020-02-15 NOTE — Consult Note (Addendum)
[facility] Nurse Consult Note: Reason for Consult: Consult requested for sacrum wound.   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  90% red, 10% yellow, mod amt pink drainage, no odor, bone palpable. Pink moist patchy areas of scattered partial thickness wounds to bilat buttocks; appearance is consistent with moisture associated skin damage.  Other areas are pink dry scar tissue from wounds which have healed.  Left hip with dark red-purple deep tissue pressure injury; 3X3cm Pressure Injury POA: Yes Dressing procedure/placement/frequency: Foam dressing to hip to protect from further injury.  Pt is on a low airloss mattress to reduce pressure. Topical treatment orders provided for staff nurses to perform daily as follows: Pack sacrum wound Q day with moist gauze, using swab to fill, then cover with foam dressing.  (Change foam dressing Q 3 days or PRN soiling.)  WOC Nurse ostomy consult note Pt had a colostomy prior to admission. Current pouch is leaking behind the barrier. Stoma type/location:  Stoma to LLQ is red and viable, above skin level. 1 1/4 inches Peristomal assessment: intact skin surrounding Output: Mod amt liquid brown stool  Ostomy pouching: 1pc Applied barrier ring to attempt to maintain a seal and one piece pouch.  3 extra sets of barrier rings and pouches ordered for the room. Pt is not awake and is on vent. He has been staying at an LTAC prior to admission and recieves total assistance with pouch application and emptying.     Please re-consult if further assistance is needed.  Thank-you,  Julien Girt MSN, [place], [place], [place], [place]

## 2020-02-15 NOTE — Progress Notes (Signed)
CRITICAL VALUE ALERT  Critical Value:  Hb 6.7 Date & Time Notied:  02/15/20 0500  Provider Notified: Dr. Lucile Shutters  Orders Received/Actions taken: awaiting new orders

## 2020-02-16 ENCOUNTER — Inpatient Hospital Stay (HOSPITAL_COMMUNITY): Payer: Medicare HMO

## 2020-02-16 LAB — TYPE AND SCREEN
ABO/RH(D): O POS
Antibody Screen: NEGATIVE
Unit division: 0
Unit division: 0

## 2020-02-16 LAB — CBC
HCT: 29.4 % — ABNORMAL LOW (ref 39.0–52.0)
Hemoglobin: 9.4 g/dL — ABNORMAL LOW (ref 13.0–17.0)
MCH: 27.1 pg (ref 26.0–34.0)
MCHC: 32 g/dL (ref 30.0–36.0)
MCV: 84.7 fL (ref 80.0–100.0)
Platelets: 133 10*3/uL — ABNORMAL LOW (ref 150–400)
RBC: 3.47 MIL/uL — ABNORMAL LOW (ref 4.22–5.81)
RDW: 17.4 % — ABNORMAL HIGH (ref 11.5–15.5)
WBC: 15.1 10*3/uL — ABNORMAL HIGH (ref 4.0–10.5)
nRBC: 0 % (ref 0.0–0.2)

## 2020-02-16 LAB — MRSA PCR SCREENING: MRSA by PCR: NEGATIVE

## 2020-02-16 LAB — BPAM RBC
Blood Product Expiration Date: 202106182359
Blood Product Expiration Date: 202106182359
ISSUE DATE / TIME: 202105201027
ISSUE DATE / TIME: 202105201422
Unit Type and Rh: 5100
Unit Type and Rh: 5100

## 2020-02-16 LAB — GLUCOSE, CAPILLARY
Glucose-Capillary: 125 mg/dL — ABNORMAL HIGH (ref 70–99)
Glucose-Capillary: 130 mg/dL — ABNORMAL HIGH (ref 70–99)
Glucose-Capillary: 131 mg/dL — ABNORMAL HIGH (ref 70–99)
Glucose-Capillary: 180 mg/dL — ABNORMAL HIGH (ref 70–99)
Glucose-Capillary: 68 mg/dL — ABNORMAL LOW (ref 70–99)
Glucose-Capillary: 71 mg/dL (ref 70–99)
Glucose-Capillary: 80 mg/dL (ref 70–99)

## 2020-02-16 LAB — BASIC METABOLIC PANEL
Anion gap: 14 (ref 5–15)
BUN: 84 mg/dL — ABNORMAL HIGH (ref 8–23)
CO2: 23 mmol/L (ref 22–32)
Calcium: 9.6 mg/dL (ref 8.9–10.3)
Chloride: 101 mmol/L (ref 98–111)
Creatinine, Ser: 3 mg/dL — ABNORMAL HIGH (ref 0.61–1.24)
GFR calc Af Amer: 23 mL/min — ABNORMAL LOW (ref >60)
GFR calc non Af Amer: 20 mL/min — ABNORMAL LOW (ref >60)
Glucose, Bld: 86 mg/dL (ref 70–99)
Potassium: 4.5 mmol/L (ref 3.5–5.1)
Sodium: 138 mmol/L (ref 135–145)

## 2020-02-16 LAB — PHOSPHORUS: Phosphorus: 5.8 mg/dL — ABNORMAL HIGH (ref 2.5–4.6)

## 2020-02-16 LAB — MAGNESIUM: Magnesium: 2.3 mg/dL (ref 1.7–2.4)

## 2020-02-16 MED ORDER — PRO-STAT SUGAR FREE PO LIQD
30.0000 mL | Freq: Two times a day (BID) | ORAL | Status: DC
  Administered 2020-02-16 (×2): 30 mL via ORAL
  Filled 2020-02-16: qty 30

## 2020-02-16 MED ORDER — HEPARIN SODIUM (PORCINE) 5000 UNIT/ML IJ SOLN
5000.0000 [IU] | Freq: Three times a day (TID) | INTRAMUSCULAR | Status: DC
  Administered 2020-02-16 – 2020-02-20 (×13): 5000 [IU] via SUBCUTANEOUS
  Filled 2020-02-16 (×13): qty 1

## 2020-02-16 MED ORDER — VITAL 1.5 CAL PO LIQD
1000.0000 mL | ORAL | Status: DC
  Administered 2020-02-16 – 2020-02-20 (×5): 1000 mL
  Filled 2020-02-16 (×8): qty 1000

## 2020-02-16 MED ORDER — DOCUSATE SODIUM 50 MG/5ML PO LIQD
100.0000 mg | Freq: Two times a day (BID) | ORAL | Status: DC
  Administered 2020-02-17 – 2020-03-06 (×31): 100 mg
  Filled 2020-02-16 (×31): qty 10

## 2020-02-16 MED ORDER — DEXTROSE 50 % IV SOLN
INTRAVENOUS | Status: AC
  Filled 2020-02-16: qty 50

## 2020-02-16 MED ORDER — ALBUMIN HUMAN 25 % IV SOLN
INTRAVENOUS | Status: AC
  Filled 2020-02-16: qty 100

## 2020-02-16 MED ORDER — DEXTROSE 50 % IV SOLN
25.0000 mL | Freq: Once | INTRAVENOUS | Status: AC
  Administered 2020-02-16: 25 mL via INTRAVENOUS

## 2020-02-16 MED ORDER — PRO-STAT SUGAR FREE PO LIQD
30.0000 mL | Freq: Two times a day (BID) | ORAL | Status: DC
  Administered 2020-02-17 – 2020-03-19 (×62): 30 mL
  Filled 2020-02-16 (×64): qty 30

## 2020-02-16 MED ORDER — HEPARIN SODIUM (PORCINE) 1000 UNIT/ML IJ SOLN
INTRAMUSCULAR | Status: AC
  Administered 2020-02-16: 2800 [IU]
  Filled 2020-02-16: qty 3

## 2020-02-16 MED ORDER — SODIUM CHLORIDE 0.9 % IV SOLN: INTRAVENOUS | Status: DC | PRN

## 2020-02-16 NOTE — Care Management (Signed)
CM contacted by [facility].  Pt was at [facility] however pt can not return due to lack of payment by pt's insurance for [facility] level of care.

## 2020-02-16 NOTE — Progress Notes (Signed)
Paged CCM regarding high respiratory rate despite dialysis. Left lower lobe sounds worse than the right. Just turned to left side and sats began to dip so we are placing back on the vent now. Will continue to monitor. Bartholomew Crews, RN 02/16/2020 4:38 PM

## 2020-02-16 NOTE — Progress Notes (Signed)
NAME:  Shane Banks, MRN:  [id number], DOB:  1946/03/02, LOS: 2 ADMISSION DATE:  02/14/2020, CONSULTATION DATE:  5/19 REFERRING MD:  Dr. Alvino Chapel, CHIEF COMPLAINT:  Hemoptysis   Brief History   74 year old male. COVID ARDS in January. Unfortunately required trach and LTACH placement. Had been tolerating trach collar, but on 5/19 he developed hemoptysis and hypoxemic respiratory failure requiring vent.   History of present illness   74 year old male with past medical history as below, which is significant for Atrial fibrillation and stroke. He was admitted to [facility] for [place] 19 pneumonia, which unfortunately progressed to ARDS.  It seems as though his highest vent settings were 60% FiO2 and 8 of PEEP.  He had very little improvement in his ventilatory and oxygenation use over the course of his hospitalization and ultimately underwent tracheostomy and [facility] [place] on [facility]. His course at [facility] was complicated by ongoing infections including UTI, MDRO pseudomonas pneumonia, and stenotrophomonas bacteremia. As a result of sepsis and shock he developed renal failure requiring dialysis, which he now receives M/W/F. In the morning hours oh 5/19 he developed hemoptysis. He had previously been taken off anticoagulation (for AF) due to anemia with positive hemoccult. Transferred to [facility] ED for evaluation of hemoptysis. Prior to the transfer he had been tolerating 28% ATC.   Past Medical History   has a past medical history of Acute on chronic respiratory failure with hypoxia ([place]), Atrial fibrillation with RVR ([place]), BPH (benign prostatic hyperplasia), COVID-19 virus infection, Pneumonia due to COVID-19 virus, Severe sepsis ([place]), and Stroke ([place]).   Significant Hospital Events   1/8  Admit to [facility] for COVID PNA 2/23 Transfer to [facility] on vent 5/19 Transfer to [facility] for hemoptysis. #6 cuffed trach placed. Back on  vent. Bronch performed 5/20 Transitioned to trach collar.   Consults:  ENT  Procedures:  ENT flex scope 5/19 > no bleeding source identified FOB 5/19 > no pulmonary bleeding source identified.   Significant Diagnostic Tests:  CXR 5/20 >> patchy bilateral infiltrates on left.   Micro Data:  BAL 5/19 > no growth  Antimicrobials:    Interim history/subjective:   On trach collar now with no acute events Due to get dialysis per nephrology  Objective   Blood pressure (!) 141/76, pulse 82, temperature 98.5 F (36.9 C), temperature source Oral, resp. rate (!) 40, height _0  (1.753 m), weight 63.4 kg, SpO2 (!) 86 %.    FiO2 (%):  [28 %-40 %] 35 %   Intake/Output Summary (Last 24 hours) at 02/16/2020 1023 Last data filed at 02/16/2020 0800 Gross per 24 hour  Intake 1886.89 ml  Output 90 ml  Net 1796.89 ml   Filed Weights   02/14/20 1516 02/15/20 0500 02/16/20 0500  Weight: 65 kg 62.8 kg 63.4 kg    Examination: Gen:      Frail, elderly HEENT:  EOMI, sclera anicteric Neck:     No masses; no thyromegaly, trach Lungs:    Clear to auscultation bilaterally; normal respiratory effort CV:         Regular rate and rhythm; no murmurs Abd:      + bowel sounds; soft, non-tender; no palpable masses, no distension Ext:    No edema; adequate peripheral perfusion Skin:      Warm and dry; no rash Neuro: Somnolent  Labs significant for BUN/creatinine 84/3, WBC 15.1, hemoglobin 9.4, platelets 133 Chest  x-ray 5/21-stable lower lobe opacities.   Resolved Hospital Problem list     Assessment & Plan:   Acute hypoxemic respiratory failure: he has a trach s/p COVID infection earlier this year. His oxygenation had  improved to tolerate 28% ATC. Course since complicated by multiple pneumonias, which is seems he has complete tx for (MDRO pseudomonas treated with inhaled tobramycin). Now he has had hemoptysis and aspirated blood. Decompensated in ER in this setting. "Hemoptysis" seems to be  originating from stoma. Ulceration?  Has a cuffed trach placed by ENT.  No active bleeding from the blood on bronchoscopy Stable for transfer out of ICU.  At some point before discharge we may consider switching back to cuffless trach Follow BAL cultures.  Holding antibiotics Tolerating trach collar  Low Hb Secondary to blood loss S/p PRBC.  Monitor CBC  ESRD on HD:  Dialysis per nephrology.  Atrial fibrillation: ST here in ED. Amiodarone stopped at [facility] due to bradycardia, anticoagulation stopped due to suspected GIB with several transfusions.  - Telemetry monitoring.  Consider restarting anticoagulation if he continues to be stable.  Nutrition: TF via PEG  Sacral decub: WOC consult   Best practice:  Diet: TF Pain/Anxiety/Delirium protocol (if indicated): PRN sedation for vent complaince VAP protocol (if indicated): Per protocol DVT prophylaxis: SCD, subcu heparin GI prophylaxis: PPI Glucose control: SSI Mobility: BR Code Status: DNR Family Communication: Sister updated 5/21 Disposition: Out of ICU   Critical care time:   Marshell Garfinkel MD [facility] Please see [email or web address] for pager details.  02/16/2020, 10:23 AM

## 2020-02-16 NOTE — Progress Notes (Signed)
Hypoglycemic Event  CBG: 68  Treatment: D50 25 mL (12.5 gm)  Symptoms: None  Follow-up CBG: Time:1116 CBG Result:125  Possible Reasons for Event: Inadequate meal intake  Comments/MD notified:Yes    Christopher Hink E

## 2020-02-16 NOTE — Progress Notes (Signed)
[facility] Progress Note  Subjective: seen in ICU, not responding.   Vitals:   02/16/20 0700 02/16/20 0724 02/16/20 0800 02/16/20 0900  BP: (!) 111/96  123/75 140/77  Pulse:   93   Resp: (!) 35  (!) 37 (!) 38  Temp:  98.5 F (36.9 C)    TempSrc:  Oral    SpO2:   100%   Weight:      Height:        Exam: Gen elderly AAM, eyes open slightly but not making contact, does not follow commands, weak and debilitated No rash, cyanosis or gangrene Trach w/ some blood-stained gauzes at base  No jvd or bruits Chest clear to A/P ant and lat RRR bounding precordium, no sig MRG Abd soft ntnd no mass or ascites +bs, +LLQ ostomy, +PEG tube GU normal male  MS no joint effusions or deformity Ext 1+ pretib edema and 1-2+ dependent hip edema Feet in boots Neuro is as above   Home meds:  - norvasc 10  - flomax 0.4/ PPI/ synthroid   - zoloft 25 qd  - prn's/ vitamins/ supplements   CXR - diffuse bilat infiltrates or scarring  Summary: 74 yo w/ hx of CVA, BPH, atrial fib who developed COVID+ PNA and was admitted to [facility].  Pt had severe lung issues requiring ICU/ ventilator support. He required tracheostomy as well. [facility] [place] on 11/21/19. While at [facility] pt had UTI, MDRO pseudomonas PNA and stenotrophomonas bacteremia. He got IV gentamicin and developed AKI and was started on dialysis around 12/16/19. Pt has been on HD since. Pt had sig hemoptysis and was admitted to ICU at [facility] from [facility] on 02/14/20.    Assessment/ Plan: 1. Hemoptysis - in pt w/ trach at [facility], admitted here for bleeding from trach site on 5/19. Seems to be improving.  Per CCM.  2. Acute/chronic resp failure - sp COVID pna earlier this year, also course at [facility] complicated by multiple infections/ PNA. Sp trach.  3. ESRD - suspected gent toxicity, started HD March 2021 while at [facility]. Getting HD there MWF schedule. HD today.  4. BP/ volume - sig pitting  leg edema bilat. UF 2-3 L w HD today 5. AMS - talked w/ sister who said he was responsive and interactive at [facility] the last time they saw him.  6. Atrial fib - per primary 7. Anemia ckd / abl - Hb 6.7 > 2u prbcs >  9.4 8. Nutrition - on PEG feeds, +diverting colostomy 9. Sacral decub - per primary, WOC 10. SP COVID +pna - earlier this year     Rob Daray Polgar 02/16/2020, 9:54 AM   Recent Labs  Lab 02/15/20 0501 02/15/20 0501 02/15/20 2003 02/16/20 0449  K 3.7  --   --  4.5  BUN 78*  --   --  84*  CREATININE 2.09*  --   --  3.00*  CALCIUM 10.1  --   --  9.6  PHOS 1.4*   < > 4.6 5.8*  HGB 6.7*  --   --  9.4*   < > = values in this interval not displayed.   Inpatient medications: . chlorhexidine gluconate (MEDLINE KIT)  15 mL Mouth Rinse BID  . Chlorhexidine Gluconate Cloth  6 each Topical Daily  . Chlorhexidine Gluconate Cloth  6 each Topical Q0600  . docusate  100 mg Oral BID  . mouth rinse  15 mL Mouth Rinse  10 times per day  . pantoprazole (PROTONIX) IV  40 mg Intravenous Q24H  . polyethylene glycol  17 g Oral Daily    fentaNYL (SUBLIMAZE) injection, fentaNYL (SUBLIMAZE) injection

## 2020-02-17 DIAGNOSIS — L89150 Pressure ulcer of sacral region, unstageable: Secondary | ICD-10-CM

## 2020-02-17 DIAGNOSIS — Z992 Dependence on renal dialysis: Secondary | ICD-10-CM

## 2020-02-17 DIAGNOSIS — N179 Acute kidney failure, unspecified: Secondary | ICD-10-CM

## 2020-02-17 LAB — MAGNESIUM: Magnesium: 2 mg/dL (ref 1.7–2.4)

## 2020-02-17 LAB — BASIC METABOLIC PANEL
Anion gap: 14 (ref 5–15)
BUN: 40 mg/dL — ABNORMAL HIGH (ref 8–23)
CO2: 26 mmol/L (ref 22–32)
Calcium: 9.2 mg/dL (ref 8.9–10.3)
Chloride: 98 mmol/L (ref 98–111)
Creatinine, Ser: 1.97 mg/dL — ABNORMAL HIGH (ref 0.61–1.24)
GFR calc Af Amer: 38 mL/min — ABNORMAL LOW (ref >60)
GFR calc non Af Amer: 33 mL/min — ABNORMAL LOW (ref >60)
Glucose, Bld: 164 mg/dL — ABNORMAL HIGH (ref 70–99)
Potassium: 3.7 mmol/L (ref 3.5–5.1)
Sodium: 138 mmol/L (ref 135–145)

## 2020-02-17 LAB — CULTURE, BAL-QUANTITATIVE W GRAM STAIN
Culture: NO GROWTH
Special Requests: NORMAL

## 2020-02-17 LAB — CBC
HCT: 31 % — ABNORMAL LOW (ref 39.0–52.0)
Hemoglobin: 9.9 g/dL — ABNORMAL LOW (ref 13.0–17.0)
MCH: 26.9 pg (ref 26.0–34.0)
MCHC: 31.9 g/dL (ref 30.0–36.0)
MCV: 84.2 fL (ref 80.0–100.0)
Platelets: 185 10*3/uL (ref 150–400)
RBC: 3.68 MIL/uL — ABNORMAL LOW (ref 4.22–5.81)
RDW: 17.2 % — ABNORMAL HIGH (ref 11.5–15.5)
WBC: 17 10*3/uL — ABNORMAL HIGH (ref 4.0–10.5)
nRBC: 0 % (ref 0.0–0.2)

## 2020-02-17 LAB — PHOSPHORUS: Phosphorus: 3.6 mg/dL (ref 2.5–4.6)

## 2020-02-17 LAB — GLUCOSE, CAPILLARY
Glucose-Capillary: 130 mg/dL — ABNORMAL HIGH (ref 70–99)
Glucose-Capillary: 152 mg/dL — ABNORMAL HIGH (ref 70–99)
Glucose-Capillary: 156 mg/dL — ABNORMAL HIGH (ref 70–99)
Glucose-Capillary: 167 mg/dL — ABNORMAL HIGH (ref 70–99)
Glucose-Capillary: 175 mg/dL — ABNORMAL HIGH (ref 70–99)
Glucose-Capillary: 191 mg/dL — ABNORMAL HIGH (ref 70–99)

## 2020-02-17 MED ORDER — INSULIN ASPART 100 UNIT/ML ~~LOC~~ SOLN
0.0000 [IU] | SUBCUTANEOUS | Status: DC
  Administered 2020-02-17 (×6): 2 [IU] via SUBCUTANEOUS
  Administered 2020-02-18: 1 [IU] via SUBCUTANEOUS
  Administered 2020-02-18 (×2): 2 [IU] via SUBCUTANEOUS
  Administered 2020-02-18 (×2): 1 [IU] via SUBCUTANEOUS
  Administered 2020-02-18 – 2020-02-19 (×3): 2 [IU] via SUBCUTANEOUS
  Administered 2020-02-19: 1 [IU] via SUBCUTANEOUS
  Administered 2020-02-19 – 2020-02-20 (×6): 2 [IU] via SUBCUTANEOUS
  Administered 2020-02-20: 3 [IU] via SUBCUTANEOUS
  Administered 2020-02-20: 1 [IU] via SUBCUTANEOUS
  Administered 2020-02-20: 2 [IU] via SUBCUTANEOUS
  Administered 2020-02-21: 1 [IU] via SUBCUTANEOUS
  Administered 2020-02-21 (×2): 2 [IU] via SUBCUTANEOUS
  Administered 2020-02-21 (×3): 1 [IU] via SUBCUTANEOUS
  Administered 2020-02-22 (×6): 2 [IU] via SUBCUTANEOUS
  Administered 2020-02-23 (×3): 1 [IU] via SUBCUTANEOUS
  Administered 2020-02-23: 2 [IU] via SUBCUTANEOUS
  Administered 2020-02-23: 1 [IU] via SUBCUTANEOUS
  Administered 2020-02-24 – 2020-02-25 (×8): 2 [IU] via SUBCUTANEOUS

## 2020-02-17 MED ORDER — ACETAMINOPHEN 160 MG/5ML PO SOLN
650.0000 mg | ORAL | Status: DC | PRN
  Administered 2020-02-19 – 2020-04-19 (×21): 650 mg
  Filled 2020-02-17 (×24): qty 20.3

## 2020-02-17 NOTE — Progress Notes (Signed)
PROGRESS NOTE    Shane Banks  MCN:[id number] DOB: 02/01/1946 DOA: 02/14/2020 PCP: [name], MD    Chief Complaint  Patient presents with  . trach bleeding    Brief Narrative:  Patient is a 74 year old male with past medical history of peripheral vascular disease, hypertension, cardiomyopathy, gout, and stroke. He was admitted to [facility] for [place] 19 pneumonia, which unfortunately progressed to ARDS.  It seems as though his highest vent settings were 60% FiO2 and 8 of PEEP.  He had very little improvement in his ventilatory and oxygenation use over the course of his hospitalization and ultimately underwent tracheostomy and was [facility] [place] on February 23. His course at [facility] was complicated by ongoing infections including UTI, MDRO pseudomonas pneumonia, infected sacral pressure ulcer status post diverting end colostomy and stenotrophomonas bacteremia. As a result of sepsis and shock he developed renal failure requiring dialysis, which he now receives M/W/F.  He was treated with multiple antibiotics including IV vancomycin, azithromycin, ceftriaxone, tigecycline, Fortaz, meropenem, Flagyl, tobramycin nebulizers, Avycaz, Levaquin, meropenem, Zyvox.  From infection standpoint he somewhat stabilized.  However, in the morning hours of 5/19 he developed hemoptysis. He had previously been taken off anticoagulation (for AF) due to anemia with positive hemoccult. Transferred to [facility] ED for evaluation of hemoptysis. Prior to the transfer he had been tolerating 28% ATC.  He was admitted to the ICU service. #6 cuffed trach placed. Back on vent. Bronch performed.  He had a anteflexed scope on 02/14/2020 with no bleeding source identified.  FOB on 5/19 also no bleeding source identified.  BAL did not show any growth.  5/20 Transitioned to trach collar.  Today he is being transferred to the hospitalist service.   Assessment & Plan:   Active Problems:    Hemoptysis   Pressure injury of skin Acute on chronic hypoxemic respiratory failure: he has a trach s/p COVID infection earlier this year. His oxygenation had improved to tolerate 28% ATC.  He has had multiple pneumonias with different organisms.  He has been treated with multiple antibiotics including Fortaz, meropenem, cefepime, tobramycin nebulizers, vancomycin, azithromycin, ceftriaxone, tigecycline, Avycaz, meropenem, Flagyl.  More recently he had had pneumonia with MDRO Pseudomonas for which he was treated with Avycaz and tobramycin nebulizers [facility].  Patient also likely has ongoing aspiration. Now he has had hemoptysis and aspirated blood.  Per critical care note "Hemoptysis" seems to be originating from stoma. Ulceration?  Has a cuffed trach placed by ENT.  No active bleeding from the blood on bronchoscopy.  Chest x-ray done yesterday per report persistent bilateral lower lobe infiltrates with small bilateral pleural effusions. Holding antibiotics for now.   Low Hb Secondary to blood loss S/p PRBC.  Monitor CBC  ESRD on HD:  Dialysis per nephrology.  Atrial fibrillation:  He had sinus tachycardia here. Amiodarone stopped at [facility] due to bradycardia, anticoagulation stopped due to suspected GIB with several transfusions.  - Telemetry monitoring.  Consider restarting anticoagulation if he continues to be stable.  Nutrition: TF via PEG  Sacral pressure ulcer unstageable: WOC consult  DVT prophylaxis: SCD Code Status: DNR Family Communication: No family at bedside Disposition: Likely to LTAC?  Status is: Inpatient              Anticipated d/c date is: Unknown at this time              Patient currently is medically stable to d/c.  Consultants:   Nephrology, ENT   Procedures:  ENT flex scope 5/19 > no bleeding source identified FOB 5/19 > no pulmonary bleeding source identified.   Antimicrobials:   None    Subjective: He is opening eyes but not  following commands.  Objective: Vitals:   02/17/20 0700 02/17/20 0706 02/17/20 0719 02/17/20 0800  BP: 97/66   105/63  Pulse:   72 73  Resp: _0 Temp:  98.1 F (36.7 C)    TempSrc:  Axillary    SpO2:   100% 100%  Weight:      Height:        Intake/Output Summary (Last 24 hours) at 02/17/2020 1017 Last data filed at 02/17/2020 0800 Gross per 24 hour  Intake 1117.42 ml  Output 2350 ml  Net -1232.58 ml   Filed Weights   02/16/20 1250 02/16/20 1654 02/17/20 0500  Weight: 63.2 kg 61.4 kg 60.8 kg    Examination:  General exam: Frail, ill-appearing male, opening eyes but not following commands  Respiratory system: Occasional rhonchi, no wheezing, has trach in place, trach site looks okay Cardiovascular system: S1 & S2 heard, RRR. No murmur Gastrointestinal system: Abdomen is nondistended, soft and nontender.  Has ostomy, normal bowel sounds Central nervous system: He is opening eyes but not following any commands at this time Extremities: No lower extremity edema. Skin: Unstageable sacral pressure ulcer Psychiatry: Unable to assess at this time    Data Reviewed: I have personally reviewed following labs and imaging studies  CBC: Recent Labs  Lab 02/14/20 0556 02/14/20 1433 02/15/20 0501 02/16/20 0449 02/17/20 0343  WBC 16.4* 16.1* 17.5* 15.1* 17.0*  NEUTROABS  --  12.2*  --   --   --   HGB 8.4* 7.7* 6.7* 9.4* 9.9*  HCT 25.9* 24.7* 21.5* 29.4* 31.0*  MCV 84.1 85.8 85.3 84.7 84.2  PLT 163 128* 128* 133* 732    Basic Metabolic Panel: Recent Labs  Lab 02/14/20 0556 02/14/20 1433 02/15/20 0501 02/15/20 2003 02/16/20 0449 02/17/20 0343  NA 138 140 140  --  138 138  K 3.5 3.6 3.7  --  4.5 3.7  CL 97* 101 101  --  101 98  CO2 _1 --  23 26  GLUCOSE 135* 180* 102*  --  86 164*  BUN 121* 63* 78*  --  84* 40*  CREATININE 2.50* 1.52* 2.09*  --  3.00* 1.97*  CALCIUM 10.8* 9.9 10.1  --  9.6 9.2  MG  --   --  1.8  --  2.3 2.0  PHOS 2.3*  --  1.4* 4.6  5.8* 3.6    GFR: Estimated Creatinine Clearance: 29.1 mL/min (A) (by C-G formula based on SCr of 1.97 mg/dL (H)).  Liver Function Tests: Recent Labs  Lab 02/12/20 0650 02/14/20 0556 02/14/20 1433  AST  --   --  29  ALT  --   --  24  ALKPHOS  --   --  116  BILITOT  --   --  1.1  PROT  --   --  8.3*  ALBUMIN 2.4* 2.8* 3.4*    CBG: Recent Labs  Lab 02/16/20 1542 02/16/20 1925 02/16/20 2333 02/17/20 0328 02/17/20 0704  GLUCAP 130* 131* 180* 175* 152*     Recent Results (from the past 240 hour(s))  Culture, blood (routine x 2)     Status: None (Preliminary result)   Collection Time: 02/14/20  2:55 PM   Specimen:  BLOOD RIGHT WRIST  Result Value Ref Range Status   Specimen Description BLOOD RIGHT WRIST  Final   Special Requests   Final    BOTTLES DRAWN AEROBIC AND ANAEROBIC Blood Culture results may not be optimal due to an excessive volume of blood received in culture bottles   Culture   Final    NO GROWTH 3 DAYS Performed at [facility], [place] [address], [place], [place] [address]    Report Status PENDING  Incomplete  SARS Coronavirus 2 by RT PCR (hospital order, performed in [facility] hospital lab) Nasopharyngeal Nasopharyngeal Swab     Status: None   Collection Time: 02/14/20  3:00 PM   Specimen: Nasopharyngeal Swab  Result Value Ref Range Status   SARS Coronavirus 2 NEGATIVE NEGATIVE Final    Comment: (NOTE) SARS-CoV-2 target nucleic acids are NOT DETECTED. The SARS-CoV-2 RNA is generally detectable in upper and lower respiratory specimens during the acute phase of infection. The lowest concentration of SARS-CoV-2 viral copies this assay can detect is 250 copies / mL. A negative result does not preclude SARS-CoV-2 infection and should not be used as the sole basis for treatment or other patient management decisions.  A negative result may occur with improper specimen collection / handling, submission of specimen other than nasopharyngeal swab, presence  of viral mutation(s) within the areas targeted by this assay, and inadequate number of viral copies (<250 copies / mL). A negative result must be combined with clinical observations, patient history, and epidemiological information. Fact Sheet for Patients:   StrictlyIdeas.no Fact Sheet for Healthcare Providers: BankingDealers.co.za This test is not yet approved or cleared  by the [place] FDA and has been authorized for detection and/or diagnosis of SARS-CoV-2 by FDA under an Emergency Use Authorization (EUA).  This EUA will remain in effect (meaning this test can be used) for the duration of the COVID-19 declaration under Section 564(b)(1) of the Act, 21 U.S.C. section 360bbb-3(b)(1), unless the authorization is terminated or revoked sooner. Performed at [facility], [place] [address], [place], [place] [address]   Culture, bal-quantitative     Status: None   Collection Time: 02/14/20  5:17 PM   Specimen: Bronchoalveolar Lavage; Respiratory  Result Value Ref Range Status   Specimen Description BRONCHIAL ALVEOLAR LAVAGE  Final   Special Requests Normal  Final   Gram Stain   Final    RARE WBC PRESENT,BOTH PMN AND MONONUCLEAR NO ORGANISMS SEEN    Culture   Final    NO GROWTH 2 DAYS Performed at [facility], [address], [place], [place] [address]    Report Status 02/17/2020 FINAL  Final  Culture, blood (routine x 2)     Status: None (Preliminary result)   Collection Time: 02/14/20  7:20 PM   Specimen: BLOOD  Result Value Ref Range Status   Specimen Description BLOOD RIGHT ANTECUBITAL  Final   Special Requests   Final    BOTTLES DRAWN AEROBIC AND ANAEROBIC Blood Culture adequate volume   Culture   Final    NO GROWTH 3 DAYS Performed at [facility], [place] [address], [place], [place] [address]    Report Status PENDING  Incomplete  MRSA PCR Screening     Status: None   Collection Time: 02/16/20  7:30 PM    Specimen: Nasal Mucosa; Nasopharyngeal  Result Value Ref Range Status   MRSA by PCR NEGATIVE NEGATIVE Final    Comment:  The GeneXpert MRSA Assay (FDA approved for NASAL specimens only), is one component of a comprehensive MRSA colonization surveillance program. It is not intended to diagnose MRSA infection nor to guide or monitor treatment for MRSA infections. Performed at [facility], [place] [address] [place], [place] [address]          Radiology Studies: DG Chest Port 1 View  Result Date: 02/16/2020 CLINICAL DATA:  Acute respiratory failure. EXAM: PORTABLE CHEST 1 VIEW COMPARISON:  02/15/2020 FINDINGS: The tracheostomy tube is stable. The right IJ dialysis catheter is stable. The cardiac silhouette, mediastinal and hilar contours are unchanged. Stable tortuosity and calcification of the thoracic aorta. Persistent left lower lobe airspace consolidation but slight improved surrounding aeration. Persistent right basilar infiltrate and small bilateral pleural effusions. IMPRESSION: 1. Stable support apparatus. 2. Persistent bilateral lower lobe infiltrates and small bilateral pleural effusions. Electronically Signed   By: Marijo Sanes M.D.   On: 02/16/2020 06:55        Scheduled Meds: . chlorhexidine gluconate (MEDLINE KIT)  15 mL Mouth Rinse BID  . Chlorhexidine Gluconate Cloth  6 each Topical Daily  . Chlorhexidine Gluconate Cloth  6 each Topical Q0600  . docusate  100 mg Per Tube BID  . feeding supplement (PRO-STAT SUGAR FREE 64)  30 mL Per Tube BID  . heparin injection (subcutaneous)  5,000 Units Subcutaneous Q8H  . insulin aspart  0-9 Units Subcutaneous Q4H  . mouth rinse  15 mL Mouth Rinse 10 times per day  . pantoprazole (PROTONIX) IV  40 mg Intravenous Q24H  . polyethylene glycol  17 g Oral Daily   Continuous Infusions: . sodium chloride Stopped (02/16/20 1718)  . feeding supplement (VITAL 1.5 CAL) 1,000 mL (02/17/20 0917)     LOS: 3 days     Yaakov Guthrie, MD [facility]   To contact the attending provider between 7A-7P or the covering provider during after hours 7P-7A, please log into the web site [email or web address] and access using universal [place] password for that web site. If you do not have the password, please call the hospital operator.  02/17/2020, 10:17 AM

## 2020-02-17 NOTE — Progress Notes (Signed)
[facility] Progress Note  Subjective: seen in ICU, nods head in response today  Vitals:   02/17/20 1200 02/17/20 1300 02/17/20 1400 02/17/20 1511  BP: 102/66 106/70 116/68   Pulse: 74 74 75   Resp: 17 18 (!) 23   Temp:    98.5 F (36.9 C)  TempSrc:    Axillary  SpO2: 100% 100% 100%   Weight:      Height:        Exam: Gen elderly AAM, awake and responds w/ head nod No rash, cyanosis or gangrene Trach w/ some blood-stained gauzes at base  No jvd or bruits Chest clear to A/P ant and lat RRR bounding precordium, no sig MRG Abd soft ntnd no mass or ascites +bs, +LLQ ostomy, +PEG tube GU normal male  MS no joint effusions or deformity Ext 1+ dependent edema Feet in boots Neuro more responsive today , more alert   Home meds:  - norvasc 10  - flomax 0.4/ PPI/ synthroid   - zoloft 25 qd  - prn's/ vitamins/ supplements   CXR - diffuse bilat infiltrates or scarring  Summary: 74 yo w/ hx of CVA, BPH, atrial fib who developed COVID+ PNA and was admitted to [facility].  Pt had severe lung issues requiring ICU/ ventilator support. He required tracheostomy as well. [facility] [place] on [facility]. While at [facility] pt had UTI, MDRO pseudomonas PNA and stenotrophomonas bacteremia. He got IV gentamicin and developed AKI and was started on dialysis around 12/16/19. Pt has been on HD since. Pt had sig hemoptysis and was admitted to ICU at [facility] from [facility] on 02/14/20.    Assessment/ Plan: 1. Hemoptysis - in pt w/ trach at [facility], admitted here for bleeding from trach site on 5/19. Seems to be improving.  Per CCM.  2. Acute/chronic resp failure - sp COVID pna earlier this year, also course at [facility] complicated by multiple infections/ PNA. Sp trach.  3. ESRD - suspected gent toxicity, started HD March 2021 while at [facility]. Getting HD there MWF schedule. HD today.  4. BP/ volume - sig pitting leg edema bilat. UF 2-3 L w HD today 5. AMS -  talked w/ sister who said he was responsive and interactive at [facility] the last time they saw him. Looking better today.  6. Atrial fib - per primary 7. Anemia ckd / abl - Hb 6.7 > 2u prbcs >  9.4 8. Nutrition - on PEG feeds, +diverting colostomy 9. Sacral decub - per primary, WOC 10. SP COVID +pna - earlier this year     Kelly Splinter 02/17/2020, 3:39 PM   Recent Labs  Lab 02/16/20 0449 02/17/20 0343  K 4.5 3.7  BUN 84* 40*  CREATININE 3.00* 1.97*  CALCIUM 9.6 9.2  PHOS 5.8* 3.6  HGB 9.4* 9.9*   Inpatient medications: . chlorhexidine gluconate (MEDLINE KIT)  15 mL Mouth Rinse BID  . Chlorhexidine Gluconate Cloth  6 each Topical Daily  . Chlorhexidine Gluconate Cloth  6 each Topical Q0600  . docusate  100 mg Per Tube BID  . feeding supplement (PRO-STAT SUGAR FREE 64)  30 mL Per Tube BID  . heparin injection (subcutaneous)  5,000 Units Subcutaneous Q8H  . insulin aspart  0-9 Units Subcutaneous Q4H  . mouth rinse  15 mL Mouth Rinse 10 times per day  . pantoprazole (PROTONIX) IV  40 mg Intravenous Q24H  . polyethylene glycol  17 g Oral Daily   .  sodium chloride Stopped (02/16/20 1718)  . feeding supplement (VITAL 1.5 CAL) 1,000 mL (02/17/20 0917)   sodium chloride, acetaminophen (TYLENOL) oral liquid 160 mg/5 mL, fentaNYL (SUBLIMAZE) injection, fentaNYL (SUBLIMAZE) injection

## 2020-02-17 NOTE — Progress Notes (Signed)
eLink Physician-Brief Progress Note Patient Name: Shane Banks DOB: 26-Sep-1946 MRN: [id number]   Date of Service  02/17/2020  HPI/Events of Note  Fever to 100.4 F - Request for Tylenol PRN. AST and ALT both normal.   eICU Interventions  Will order: 1. Blood cultures X 2.  2. Tracheal aspirate culture.  3. Tylenol liquid 650 mg per tube Q 6 hour PRN Temp > 101.0 F.     Intervention Category Major Interventions: Other:  Lysle Dingwall 02/17/2020, 3:39 AM

## 2020-02-17 NOTE — Progress Notes (Signed)
[facility] Progress Note Patient Name: Oseph Imburgia DOB: 1946/06/12 MRN: [id number]   Date of Service  02/17/2020  HPI/Events of Note  Hyperglycemia - Blood glucose = 180.   eICU Interventions  Plan: 1. Q 4 hour sensitive Novolog SSI.     Intervention Category Major Interventions: Hyperglycemia - active titration of insulin therapy  Lysle Dingwall 02/17/2020, 12:18 AM

## 2020-02-18 DIAGNOSIS — E43 Unspecified severe protein-calorie malnutrition: Secondary | ICD-10-CM | POA: Diagnosis present

## 2020-02-18 LAB — CBC
HCT: 30.4 % — ABNORMAL LOW (ref 39.0–52.0)
Hemoglobin: 9.8 g/dL — ABNORMAL LOW (ref 13.0–17.0)
MCH: 27.3 pg (ref 26.0–34.0)
MCHC: 32.2 g/dL (ref 30.0–36.0)
MCV: 84.7 fL (ref 80.0–100.0)
Platelets: 175 10*3/uL (ref 150–400)
RBC: 3.59 MIL/uL — ABNORMAL LOW (ref 4.22–5.81)
RDW: 17.2 % — ABNORMAL HIGH (ref 11.5–15.5)
WBC: 15.5 10*3/uL — ABNORMAL HIGH (ref 4.0–10.5)
nRBC: 0 % (ref 0.0–0.2)

## 2020-02-18 LAB — GLUCOSE, CAPILLARY
Glucose-Capillary: 149 mg/dL — ABNORMAL HIGH (ref 70–99)
Glucose-Capillary: 152 mg/dL — ABNORMAL HIGH (ref 70–99)
Glucose-Capillary: 161 mg/dL — ABNORMAL HIGH (ref 70–99)
Glucose-Capillary: 138 mg/dL — ABNORMAL HIGH (ref 70–99)
Glucose-Capillary: 154 mg/dL — ABNORMAL HIGH (ref 70–99)

## 2020-02-18 LAB — BASIC METABOLIC PANEL
Anion gap: 15 (ref 5–15)
BUN: 62 mg/dL — ABNORMAL HIGH (ref 8–23)
CO2: 24 mmol/L (ref 22–32)
Calcium: 9.2 mg/dL (ref 8.9–10.3)
Chloride: 101 mmol/L (ref 98–111)
Creatinine, Ser: 2.72 mg/dL — ABNORMAL HIGH (ref 0.61–1.24)
GFR calc Af Amer: 26 mL/min — ABNORMAL LOW (ref >60)
GFR calc non Af Amer: 22 mL/min — ABNORMAL LOW (ref >60)
Glucose, Bld: 156 mg/dL — ABNORMAL HIGH (ref 70–99)
Potassium: 4 mmol/L (ref 3.5–5.1)
Sodium: 140 mmol/L (ref 135–145)

## 2020-02-18 MED ORDER — CHLORHEXIDINE GLUCONATE CLOTH 2 % EX PADS
6.0000 | MEDICATED_PAD | Freq: Every day | CUTANEOUS | Status: DC
  Administered 2020-02-20: 6 via TOPICAL

## 2020-02-18 MED ORDER — POLYETHYLENE GLYCOL 3350 17 G PO PACK
17.0000 g | PACK | Freq: Every day | ORAL | Status: DC
  Administered 2020-02-19 – 2020-03-05 (×11): 17 g
  Filled 2020-02-18 (×11): qty 1

## 2020-02-18 NOTE — Plan of Care (Signed)

## 2020-02-18 NOTE — Progress Notes (Signed)
[facility] Progress Note  Subjective: seen in ICU, no c/o's, somnolent  Vitals:   02/18/20 0900 02/18/20 1000 02/18/20 1100 02/18/20 1125  BP: 126/64 124/72 132/81   Pulse: 74  (!) 49 (!) 101  Resp: (!) 36 (!) 36 (!) 41 (!) 38  Temp:    99.7 F (37.6 C)  TempSrc:    Axillary  SpO2: 100%  100% 100%  Weight:      Height:        Exam: Gen elderly AAM, awake , responsive No jvd or bruits Chest clear to A/P ant and lat RRR bounding precordium, no sig MRG Abd soft ntnd no mass or ascites +bs, +LLQ ostomy, +PEG tube GU normal male  MS no joint effusions or deformity Ext 1+ dependent edema Feet in boots Neuro more responsive today , more alert  RIJ TDC   Home meds:  - norvasc 10  - flomax 0.4/ PPI/ synthroid   - zoloft 25 qd  - prn's/ vitamins/ supplements   CXR - diffuse bilat infiltrates or scarring  Summary: 74 yo w/ hx of CVA, BPH, atrial fib who developed COVID+ PNA and was admitted to [facility].  Pt had severe lung issues requiring ICU/ ventilator support. He required tracheostomy as well. [facility] [place] on [facility]/21/[facility]. While at [facility] pt had UTI, MDRO pseudomonas PNA and stenotrophomonas bacteremia. He got IV gentamicin and developed AKI and was started on dialysis around 12/16/19. Pt has been on HD since. Pt had sig hemoptysis and was admitted to ICU at [facility] from [facility] on 02/14/20.    Assessment/ Plan: 1. Hemoptysis - in pt w/ trach at [facility], admitted here for bleeding from trach site on 5/19. Seems to be resolved.  Per CCM.  2. Acute/chronic resp failure - sp COVID pna earlier this year, also course at [facility] complicated by multiple infections/ PNA. Sp trach.  3. ESRD - suspected gent toxicity, started HD March 2021 while at [place]. Getting HD there MWF schedule, RIJ TDC. HD Monday.  4. BP/ volume excess - sig pitting leg edema bilat, wt's down 2-3kg here. UF 2-3 L w/ HD tomorrow as tol 5. AMS - talked w/  sister who said he was responsive and interactive at [facility] the last time they saw him. Close to baseline MS most likely.  6. Atrial fib - per primary 7. Anemia ckd / abl - Hb 6.7 > 2u prbcs >  9.4 8. Nutrition - on PEG feeds, +diverting colostomy 9. Sacral decub - per primary, WOC 10. SP COVID +pna - earlier this year  60. Dispo - stable for dc from renal standpoint    Kelly Splinter 02/18/2020, 12:18 PM   Recent Labs  Lab 02/16/20 0449 02/16/20 0449 02/17/20 0343 02/18/20 0415  K 4.5   < > 3.7 4.0  BUN 84*   < > 40* 62*  CREATININE 3.00*   < > 1.97* 2.72*  CALCIUM 9.6   < > 9.2 9.2  PHOS 5.8*  --  3.6  --   HGB 9.4*   < > 9.9* 9.8*   < > = values in this interval not displayed.   Inpatient medications: . chlorhexidine gluconate (MEDLINE KIT)  15 mL Mouth Rinse BID  . Chlorhexidine Gluconate Cloth  6 each Topical Daily  . Chlorhexidine Gluconate Cloth  6 each Topical Q0600  . docusate  100 mg Per Tube BID  . feeding supplement (PRO-STAT SUGAR FREE 64)  30 mL Per Tube BID  . heparin injection (subcutaneous)  5,000 Units Subcutaneous Q8H  . insulin aspart  0-9 Units Subcutaneous Q4H  . mouth rinse  15 mL Mouth Rinse 10 times per day  . pantoprazole (PROTONIX) IV  40 mg Intravenous Q24H  . polyethylene glycol  17 g Oral Daily   . sodium chloride Stopped (02/16/20 1718)  . feeding supplement (VITAL 1.5 CAL) 1,000 mL (02/18/20 0514)   sodium chloride, acetaminophen (TYLENOL) oral liquid 160 mg/5 mL, fentaNYL (SUBLIMAZE) injection, fentaNYL (SUBLIMAZE) injection

## 2020-02-18 NOTE — Plan of Care (Signed)
  Problem: Activity: Goal: Risk for activity intolerance will decrease Outcome: Progressing   Problem: Nutrition: Goal: Adequate nutrition will be maintained Outcome: Progressing   Problem: Pain Managment: Goal: General experience of comfort will improve Outcome: Progressing   

## 2020-02-18 NOTE — Progress Notes (Signed)
PROGRESS NOTE    Shane Banks  CHE:[id number] DOB: 08/01/1946 DOA: 02/14/2020 PCP: [name], MD    Chief Complaint  Patient presents with  . trach bleeding    Brief Narrative:  Patient is a 74 year old male with past medical history of peripheral vascular disease, hypertension, cardiomyopathy, gout, and stroke. He was admitted to [facility] for [place] 19 pneumonia, which unfortunately progressed to ARDS.  It seems as though his highest vent settings were 60% FiO2 and 8 of PEEP.  He had very little improvement in his ventilatory and oxygenation use over the course of his hospitalization and ultimately underwent tracheostomy and was [facility] to [facility] in [place] on February 23. 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 As a result of sepsis and shock he developed renal failure requiring dialysis, which he now receives M/W/F.  He was treated with multiple antibiotics including IV vancomycin, azithromycin, ceftriaxone, tigecycline, Fortaz, meropenem, Flagyl, tobramycin nebulizers, Avycaz, Levaquin, meropenem, Zyvox.  From infection standpoint he somewhat stabilized.  However, in the morning hours of 5/19 he developed hemoptysis. He had previously been taken off anticoagulation (for AF) due to anemia with positive hemoccult. Transferred to [facility] ED for evaluation of hemoptysis. Prior to the transfer he had been tolerating 28% ATC.  He was admitted to the ICU service. #6 cuffed trach placed. Back on vent. Bronch performed.  He had a anteflexed scope on 02/14/2020 with no bleeding source identified.  FOB on 5/19 also no bleeding source identified.  BAL did not show any growth.  5/20 Transitioned to trach collar. Transferred to the hospitalist service.   Assessment & Plan:   Active Problems:   Hemoptysis    Pressure injury of skin   Protein-calorie malnutrition, severe Acute on chronic hypoxemic respiratory failure: he has a trach s/p COVID infection earlier this year. His oxygenation had improved to tolerate 28% ATC.  He has had multiple pneumonias with different organisms.  He has been treated with multiple antibiotics including Fortaz, meropenem, cefepime, tobramycin nebulizers, vancomycin, azithromycin, ceftriaxone, tigecycline, Avycaz, meropenem, Flagyl.  More recently he had had pneumonia with MDRO Pseudomonas for which he was treated with Avycaz and tobramycin nebulizers at [facility].  Patient also likely has ongoing aspiration. Now he had hemoptysis and aspirated blood.  Per critical care note "Hemoptysis" seems to be originating from stoma. Ulceration?  Has a cuffed trach placed by ENT.  No active bleeding from the blood on bronchoscopy.  Chest x-ray per report persistent bilateral lower lobe infiltrates with small bilateral pleural effusions.  He has high suspicion for ongoing aspiration and at risk for aspiration pneumonia.  Currently afebrile. Holding antibiotics for now unless he starts having any worsening fevers.   Low Hb Secondary to blood loss S/p PRBC.  Monitor CBC  ESRD on HD:  Dialysis per nephrology.  Atrial fibrillation:  He had sinus tachycardia here. Amiodarone stopped at [facility] due to bradycardia, anticoagulation stopped due to suspected GIB with several transfusions.  - Telemetry monitoring.  Consider restarting anticoagulation if he continues to be stable.  Nutrition: TF via PEG  Sacral pressure ulcer unstageable: WOC consult  DVT prophylaxis: SCD Code Status: DNR Family Communication: No family at bedside Disposition: Likely to LTAC?  Status is: Inpatient              Anticipated d/c date is: Unknown at this time  Patient currently is medically stable to d/c.    Consultants:   Nephrology, ENT   Procedures:  ENT flex scope 5/19 > no  bleeding source identified FOB 5/19 > no pulmonary bleeding source identified.   Antimicrobials:   None    Subjective: He is more awake today.  Per nursing staff he had some mild bleeding from around the trach which has improved.  He has no bleeding today.  Objective: Vitals:   02/18/20 0600 02/18/20 0700 02/18/20 0733 02/18/20 0800  BP: 118/70 121/64 121/64 116/69  Pulse:  73 76 78  Resp: 16 (!) 24 (!) 30 (!) 36  Temp:  99.1 F (37.3 C)    TempSrc:  Axillary    SpO2:  100% 100% 100%  Weight:      Height:        Intake/Output Summary (Last 24 hours) at 02/18/2020 3419 Last data filed at 02/18/2020 0800 Gross per 24 hour  Intake 1210 ml  Output 550 ml  Net 660 ml   Filed Weights   02/16/20 1654 02/17/20 0500 02/18/20 0500  Weight: 61.4 kg 60.8 kg 60.8 kg    Examination:  General exam: Frail, ill-appearing male, opening eyes, following few commands  Respiratory system: Occasional rhonchi, no wheezing, has trach in place, trach site looks okay Cardiovascular system: S1 & S2 heard, RRR. No murmur Gastrointestinal system: Abdomen is nondistended, soft and nontender.  Has ostomy, normal bowel sounds Central nervous system: He is opening eyes, following few commands.  Has debility with generalized weakness. Extremities: No lower extremity edema. Skin: Unstageable sacral pressure ulcer Psychiatry: Not agitated   Data Reviewed: I have personally reviewed following labs and imaging studies  CBC: Recent Labs  Lab 02/14/20 1433 02/15/20 0501 02/16/20 0449 02/17/20 0343 02/18/20 0415  WBC 16.1* 17.5* 15.1* 17.0* 15.5*  NEUTROABS 12.2*  --   --   --   --   HGB 7.7* 6.7* 9.4* 9.9* 9.8*  HCT 24.7* 21.5* 29.4* 31.0* 30.4*  MCV 85.8 85.3 84.7 84.2 84.7  PLT 128* 128* 133* 185 622    Basic Metabolic Panel: Recent Labs  Lab 02/14/20 0556 02/14/20 0556 02/14/20 1433 02/15/20 0501 02/15/20 2003 02/16/20 0449 02/17/20 0343 02/18/20 0415  NA 138   < > 140 140  --   138 138 140  K 3.5   < > 3.6 3.7  --  4.5 3.7 4.0  CL 97*   < > 101 101  --  101 98 101  CO2 25   < > 27 27  --  _0 GLUCOSE 135*   < > 180* 102*  --  86 164* 156*  BUN 121*   < > 63* 78*  --  84* 40* 62*  CREATININE 2.50*   < > 1.52* 2.09*  --  3.00* 1.97* 2.72*  CALCIUM 10.8*   < > 9.9 10.1  --  9.6 9.2 9.2  MG  --   --   --  1.8  --  2.3 2.0  --   PHOS 2.3*  --   --  1.4* 4.6 5.8* 3.6  --    < > = values in this interval not displayed.    GFR: Estimated Creatinine Clearance: 21.1 mL/min (A) (by C-G formula based on SCr of 2.72 mg/dL (H)).  Liver Function Tests: Recent Labs  Lab 02/12/20 0650 02/14/20 0556 02/14/20 1433  AST  --   --  29  ALT  --   --  24  ALKPHOS  --   --  116  BILITOT  --   --  1.1  PROT  --   --  8.3*  ALBUMIN 2.4* 2.8* 3.4*    CBG: Recent Labs  Lab 02/17/20 1509 02/17/20 1923 02/17/20 2334 02/18/20 0339 02/18/20 0712  GLUCAP 167* 156* 130* 149* 152*     Recent Results (from the past 240 hour(s))  Culture, blood (routine x 2)     Status: None (Preliminary result)   Collection Time: 02/14/20  2:55 PM   Specimen: BLOOD RIGHT WRIST  Result Value Ref Range Status   Specimen Description BLOOD RIGHT WRIST  Final   Special Requests   Final    BOTTLES DRAWN AEROBIC AND ANAEROBIC Blood Culture results may not be optimal due to an excessive volume of blood received in culture bottles   Culture   Final    NO GROWTH 3 DAYS Performed at [facility], [place] [address], [place], [place] [address]    Report Status PENDING  Incomplete  SARS Coronavirus 2 by RT PCR (hospital order, performed in [facility] hospital lab) Nasopharyngeal Nasopharyngeal Swab     Status: None   Collection Time: 02/14/20  3:00 PM   Specimen: Nasopharyngeal Swab  Result Value Ref Range Status   SARS Coronavirus 2 NEGATIVE NEGATIVE Final    Comment: (NOTE) SARS-CoV-2 target nucleic acids are NOT DETECTED. The SARS-CoV-2 RNA is generally detectable in upper and  lower respiratory specimens during the acute phase of infection. The lowest concentration of SARS-CoV-2 viral copies this assay can detect is 250 copies / mL. A negative result does not preclude SARS-CoV-2 infection and should not be used as the sole basis for treatment or other patient management decisions.  A negative result may occur with improper specimen collection / handling, submission of specimen other than nasopharyngeal swab, presence of viral mutation(s) within the areas targeted by this assay, and inadequate number of viral copies (<250 copies / mL). A negative result must be combined with clinical observations, patient history, and epidemiological information. Fact Sheet for Patients:   StrictlyIdeas.no Fact Sheet for Healthcare Providers: BankingDealers.co.za This test is not yet approved or cleared  by the [place] FDA and has been authorized for detection and/or diagnosis of SARS-CoV-2 by FDA under an Emergency Use Authorization (EUA).  This EUA will remain in effect (meaning this test can be used) for the duration of the COVID-19 declaration under Section 564(b)(1) of the Act, 21 U.S.C. section 360bbb-3(b)(1), unless the authorization is terminated or revoked sooner. Performed at [facility], [place] [address], [place], [place] [address]   Culture, bal-quantitative     Status: None   Collection Time: 02/14/20  5:17 PM   Specimen: Bronchoalveolar Lavage; Respiratory  Result Value Ref Range Status   Specimen Description BRONCHIAL ALVEOLAR LAVAGE  Final   Special Requests Normal  Final   Gram Stain   Final    RARE WBC PRESENT,BOTH PMN AND MONONUCLEAR NO ORGANISMS SEEN    Culture   Final    NO GROWTH 2 DAYS Performed at [facility], [address], [place], [place] [address]    Report Status 02/17/2020 FINAL  Final  Culture, blood (routine x 2)     Status: None (Preliminary result)   Collection Time:  02/14/20  7:20 PM   Specimen: BLOOD  Result Value Ref Range Status   Specimen Description BLOOD RIGHT ANTECUBITAL  Final   Special Requests  Final    BOTTLES DRAWN AEROBIC AND ANAEROBIC Blood Culture adequate volume   Culture   Final    NO GROWTH 3 DAYS Performed at [facility], [place] [address], [place], [place] [address]    Report Status PENDING  Incomplete  MRSA PCR Screening     Status: None   Collection Time: 02/16/20  7:30 PM   Specimen: Nasal Mucosa; Nasopharyngeal  Result Value Ref Range Status   MRSA by PCR NEGATIVE NEGATIVE Final    Comment:        The GeneXpert MRSA Assay (FDA approved for NASAL specimens only), is one component of a comprehensive MRSA colonization surveillance program. It is not intended to diagnose MRSA infection nor to guide or monitor treatment for MRSA infections. Performed at [facility], [place] [address] [place], [place] [address]   Culture, respiratory (non-expectorated)     Status: None (Preliminary result)   Collection Time: 02/17/20  7:30 AM   Specimen: Tracheal Aspirate; Respiratory  Result Value Ref Range Status   Specimen Description TRACHEAL ASPIRATE  Final   Special Requests Normal  Final   Gram Stain   Final    RARE WBC PRESENT, PREDOMINANTLY PMN NO ORGANISMS SEEN Performed at [facility], [place] [address] [place], [place] [address]    Culture PENDING  Incomplete   Report Status PENDING  Incomplete         Radiology Studies: No results found.   Scheduled Meds: . chlorhexidine gluconate (MEDLINE KIT)  15 mL Mouth Rinse BID  . Chlorhexidine Gluconate Cloth  6 each Topical Daily  . Chlorhexidine Gluconate Cloth  6 each Topical Q0600  . docusate  100 mg Per Tube BID  . feeding supplement (PRO-STAT SUGAR FREE 64)  30 mL Per Tube BID  . heparin injection (subcutaneous)  5,000 Units Subcutaneous Q8H  . insulin aspart  0-9 Units Subcutaneous Q4H  . mouth rinse  15 mL Mouth Rinse 10 times per day  .  pantoprazole (PROTONIX) IV  40 mg Intravenous Q24H  . polyethylene glycol  17 g Oral Daily   Continuous Infusions: . sodium chloride Stopped (02/16/20 1718)  . feeding supplement (VITAL 1.5 CAL) 1,000 mL (02/18/20 0514)     LOS: 4 days    Yaakov Guthrie, MD [facility]   To contact the attending provider between 7A-7P or the covering provider during after hours 7P-7A, please log into the web site [email or web address] and access using universal [place] password for that web site. If you do not have the password, please call the hospital operator.  02/18/2020, 9:42 AM

## 2020-02-19 ENCOUNTER — Inpatient Hospital Stay (HOSPITAL_COMMUNITY): Payer: Medicare HMO

## 2020-02-19 DIAGNOSIS — L89159 Pressure ulcer of sacral region, unspecified stage: Secondary | ICD-10-CM

## 2020-02-19 DIAGNOSIS — E43 Unspecified severe protein-calorie malnutrition: Secondary | ICD-10-CM

## 2020-02-19 DIAGNOSIS — D649 Anemia, unspecified: Secondary | ICD-10-CM

## 2020-02-19 DIAGNOSIS — Z930 Tracheostomy status: Secondary | ICD-10-CM

## 2020-02-19 DIAGNOSIS — J189 Pneumonia, unspecified organism: Secondary | ICD-10-CM

## 2020-02-19 DIAGNOSIS — Z9911 Dependence on respirator [ventilator] status: Secondary | ICD-10-CM

## 2020-02-19 LAB — BASIC METABOLIC PANEL
Anion gap: 18 — ABNORMAL HIGH (ref 5–15)
BUN: 86 mg/dL — ABNORMAL HIGH (ref 8–23)
CO2: 25 mmol/L (ref 22–32)
Calcium: 9.9 mg/dL (ref 8.9–10.3)
Chloride: 100 mmol/L (ref 98–111)
Creatinine, Ser: 3.75 mg/dL — ABNORMAL HIGH (ref 0.61–1.24)
GFR calc Af Amer: 18 mL/min — ABNORMAL LOW (ref >60)
GFR calc non Af Amer: 15 mL/min — ABNORMAL LOW (ref >60)
Glucose, Bld: 154 mg/dL — ABNORMAL HIGH (ref 70–99)
Potassium: 5 mmol/L (ref 3.5–5.1)
Sodium: 143 mmol/L (ref 135–145)

## 2020-02-19 LAB — CBC
HCT: 30.2 % — ABNORMAL LOW (ref 39.0–52.0)
Hemoglobin: 9.6 g/dL — ABNORMAL LOW (ref 13.0–17.0)
MCH: 26.7 pg (ref 26.0–34.0)
MCHC: 31.8 g/dL (ref 30.0–36.0)
MCV: 84.1 fL (ref 80.0–100.0)
Platelets: 208 10*3/uL (ref 150–400)
RBC: 3.59 MIL/uL — ABNORMAL LOW (ref 4.22–5.81)
RDW: 17.2 % — ABNORMAL HIGH (ref 11.5–15.5)
WBC: 17.4 10*3/uL — ABNORMAL HIGH (ref 4.0–10.5)
nRBC: 0 % (ref 0.0–0.2)

## 2020-02-19 LAB — GLUCOSE, CAPILLARY
Glucose-Capillary: 134 mg/dL — ABNORMAL HIGH (ref 70–99)
Glucose-Capillary: 137 mg/dL — ABNORMAL HIGH (ref 70–99)
Glucose-Capillary: 155 mg/dL — ABNORMAL HIGH (ref 70–99)
Glucose-Capillary: 159 mg/dL — ABNORMAL HIGH (ref 70–99)
Glucose-Capillary: 163 mg/dL — ABNORMAL HIGH (ref 70–99)
Glucose-Capillary: 167 mg/dL — ABNORMAL HIGH (ref 70–99)
Glucose-Capillary: 193 mg/dL — ABNORMAL HIGH (ref 70–99)

## 2020-02-19 LAB — HEMOGLOBIN A1C
Hgb A1c MFr Bld: 5.5 % (ref 4.8–5.6)
Mean Plasma Glucose: 111 mg/dL

## 2020-02-19 LAB — BLOOD CULTURE ID PANEL (REFLEXED)

## 2020-02-19 LAB — CULTURE, BLOOD (ROUTINE X 2)
Culture: NO GROWTH
Culture: NO GROWTH
Special Requests: ADEQUATE

## 2020-02-19 MED ORDER — DILTIAZEM HCL 30 MG PO TABS
30.0000 mg | ORAL_TABLET | Freq: Three times a day (TID) | ORAL | Status: DC
  Administered 2020-02-19 – 2020-02-21 (×7): 30 mg via ORAL
  Filled 2020-02-19 (×7): qty 1

## 2020-02-19 MED ORDER — HEPARIN SODIUM (PORCINE) 1000 UNIT/ML IJ SOLN
INTRAMUSCULAR | Status: AC
  Filled 2020-02-19: qty 3

## 2020-02-19 NOTE — Progress Notes (Signed)
PROGRESS NOTE    Shane Banks  PPJ:[id number] DOB: October 21, 1945 DOA: 02/14/2020 PCP: [name], MD    Chief Complaint  Patient presents with  . trach bleeding    Brief Narrative:  Patient is a 74 year old male with past medical history of peripheral vascular disease, hypertension, cardiomyopathy, gout, and stroke. He was admitted to [facility] for [place] 19 pneumonia, which unfortunately progressed to ARDS.  It seems as though his highest vent settings were 60% FiO2 and 8 of PEEP.  He had very little improvement in his ventilatory and oxygenation use over the course of his hospitalization and ultimately underwent tracheostomy and was [facility] to [facility] in [place] on February 23. 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 As a result of sepsis and shock he developed renal failure requiring dialysis, which he now receives M/W/F.  He was treated with multiple antibiotics including IV vancomycin, azithromycin, ceftriaxone, tigecycline, Fortaz, meropenem, Flagyl, tobramycin nebulizers, Avycaz, Levaquin, meropenem, Zyvox.  From infection standpoint he somewhat stabilized.  However, in the morning hours of 5/19 he developed hemoptysis. He had previously been taken off anticoagulation (for AF) due to anemia with positive hemoccult. Transferred to [facility] ED for evaluation of hemoptysis. Prior to the transfer he had been tolerating 28% ATC.  He was admitted to the ICU service. #6 cuffed trach placed. Back on vent. Bronch performed.  He had a anteflexed scope on 02/14/2020 with no bleeding source identified.  FOB on 5/19 also no bleeding source identified.  BAL did not show any growth.  5/20 Transitioned to trach collar. Transferred to the hospitalist service.   Assessment & Plan:   Active Problems:   Hemoptysis    Pressure injury of skin   Protein-calorie malnutrition, severe Acute on chronic hypoxemic respiratory failure: he has a trach s/p COVID infection earlier this year. His oxygenation had improved to tolerate 28% ATC.  He has had multiple pneumonias with different organisms.  He has been treated with multiple antibiotics including Fortaz, meropenem, cefepime, tobramycin nebulizers, vancomycin, azithromycin, ceftriaxone, tigecycline, Avycaz, meropenem, Flagyl.  More recently he had had pneumonia with MDRO Pseudomonas for which he was treated with Avycaz and tobramycin nebulizers at [facility].  Patient also likely has ongoing aspiration. Now he had hemoptysis and aspirated blood.  Per critical care note "Hemoptysis" seems to be originating from stoma. Ulceration?  Has a cuffed trach placed by ENT.  No active bleeding on bronchoscopy.  Chest x-ray per report persistent bilateral lower lobe infiltrates with small bilateral pleural effusions.  He has high suspicion for ongoing aspiration and at risk for aspiration pneumonia.  Therefore high risk for recurrent pneumonia.  Holding antibiotics for now unless he starts having any worsening fevers.   Low Hb Secondary to blood loss S/p PRBC.  Monitor CBC  ESRD on HD:  Dialysis per nephrology.  Atrial fibrillation:  He had sinus tachycardia here. Amiodarone stopped at [facility] due to bradycardia, anticoagulation stopped due to suspected GIB with several transfusions.  - His heart rate and blood pressure is fluctuating.  He is likely back in A. Fib.  -Since he had bradycardia with amiodarone will consider starting short-acting Cardizem and monitor. -ContinueTelemetry monitoring.   Consider restarting anticoagulation if he continues to be stable.  Nutrition: TF via PEG  Sacral pressure ulcer unstageable: WOC consult  DVT prophylaxis: Subcutaneous heparin Code Status: DNR Family Communication:  No family at bedside Disposition: Likely to LTAC?  Status is:  Inpatient              Anticipated d/c date is: Unknown at this time              Patient currently is medically stable to d/c.    Consultants:   Nephrology, ENT   Procedures:  ENT flex scope 5/19 > no bleeding source identified FOB 5/19 > no pulmonary bleeding source identified.   Antimicrobials:   None    Subjective: He is getting dialysis at the bedside.  He is opening eyes but not following commands.  Heart rate and blood pressure fluctuating.  No further bleeding from around the trach but having increased secretions per the nursing staff.  Objective: Vitals:   02/19/20 0800 02/19/20 0815 02/19/20 0830 02/19/20 0845  BP: (!) 123/100 (!) 106/55 120/61 (!) 105/58  Pulse: (!) 115 (!) 112 (!) 104 (!) 103  Resp:      Temp:      TempSrc:      SpO2:      Weight:      Height:        Intake/Output Summary (Last 24 hours) at 02/19/2020 0854 Last data filed at 02/19/2020 0600 Gross per 24 hour  Intake 1300 ml  Output 150 ml  Net 1150 ml   Filed Weights   02/18/20 0500 02/19/20 0500 02/19/20 0701  Weight: 60.8 kg 62 kg 64.7 kg    Examination:  General exam: Frail, ill-appearing male, opening eyes, following few commands  Respiratory system: Occasional rhonchi, no wheezing, has trach in place, trach site looks okay Cardiovascular system: Irregularly irregular, No murmur Gastrointestinal system: Abdomen is nondistended, soft and nontender.  Has ostomy, normal bowel sounds Central nervous system: He is opening eyes, following few commands.  Has debility with generalized weakness. Extremities: No lower extremity edema. Skin: Unstageable sacral pressure ulcer Psychiatry: Not agitated   Data Reviewed: I have personally reviewed following labs and imaging studies  CBC: Recent Labs  Lab 02/14/20 1433 02/14/20 1433 02/15/20 0501 02/16/20 0449 02/17/20 0343 02/18/20 0415 02/19/20 0557  WBC 16.1*   < > 17.5* 15.1* 17.0* 15.5* 17.4*  NEUTROABS 12.2*  --   --   --    --   --   --   HGB 7.7*   < > 6.7* 9.4* 9.9* 9.8* 9.6*  HCT 24.7*   < > 21.5* 29.4* 31.0* 30.4* 30.2*  MCV 85.8   < > 85.3 84.7 84.2 84.7 84.1  PLT 128*   < > 128* 133* 185 175 208   < > = values in this interval not displayed.    Basic Metabolic Panel: Recent Labs  Lab 02/14/20 0556 02/14/20 1433 02/15/20 0501 02/15/20 2003 02/16/20 0449 02/17/20 0343 02/18/20 0415 02/19/20 0557  NA 138   < > 140  --  138 138 140 143  K 3.5   < > 3.7  --  4.5 3.7 4.0 5.0  CL 97*   < > 101  --  101 98 101 100  CO2 25   < > 27  --  _0 GLUCOSE 135*   < > 102*  --  86 164* 156* 154*  BUN 121*   < > 78*  --  84* 40* 62* 86*  CREATININE 2.50*   < > 2.09*  --  3.00* 1.97* 2.72* 3.75*  CALCIUM 10.8*   < > 10.1  --  9.6 9.2 9.2 9.9  MG  --   --  1.8  --  2.3 2.0  --   --   PHOS 2.3*  --  1.4* 4.6 5.8* 3.6  --   --    < > = values in this interval not displayed.    GFR: Estimated Creatinine Clearance: 16.3 mL/min (A) (by C-G formula based on SCr of 3.75 mg/dL (H)).  Liver Function Tests: Recent Labs  Lab 02/14/20 0556 02/14/20 1433  AST  --  29  ALT  --  24  ALKPHOS  --  116  BILITOT  --  1.1  PROT  --  8.3*  ALBUMIN 2.8* 3.4*    CBG: Recent Labs  Lab 02/18/20 1526 02/18/20 1926 02/19/20 0003 02/19/20 0342 02/19/20 0728  GLUCAP 138* 154* 163* 159* 134*     Recent Results (from the past 240 hour(s))  Culture, blood (routine x 2)     Status: None   Collection Time: 02/14/20  2:55 PM   Specimen: BLOOD RIGHT WRIST  Result Value Ref Range Status   Specimen Description BLOOD RIGHT WRIST  Final   Special Requests   Final    BOTTLES DRAWN AEROBIC AND ANAEROBIC Blood Culture results may not be optimal due to an excessive volume of blood received in culture bottles   Culture   Final    NO GROWTH 5 DAYS Performed at [facility], [place] [address] [place], [place] [address]    Report Status 02/19/2020 FINAL  Final  SARS Coronavirus 2 by RT PCR (hospital order,  performed in [facility] hospital lab) Nasopharyngeal Nasopharyngeal Swab     Status: None   Collection Time: 02/14/20  3:00 PM   Specimen: Nasopharyngeal Swab  Result Value Ref Range Status   SARS Coronavirus 2 NEGATIVE NEGATIVE Final    Comment: (NOTE) SARS-CoV-2 target nucleic acids are NOT DETECTED. The SARS-CoV-2 RNA is generally detectable in upper and lower respiratory specimens during the acute phase of infection. The lowest concentration of SARS-CoV-2 viral copies this assay can detect is 250 copies / mL. A negative result does not preclude SARS-CoV-2 infection and should not be used as the sole basis for treatment or other patient management decisions.  A negative result may occur with improper specimen collection / handling, submission of specimen other than nasopharyngeal swab, presence of viral mutation(s) within the areas targeted by this assay, and inadequate number of viral copies (<250 copies / mL). A negative result must be combined with clinical observations, patient history, and epidemiological information. Fact Sheet for Patients:   StrictlyIdeas.no Fact Sheet for Healthcare Providers: BankingDealers.co.za This test is not yet approved or cleared  by the [place] FDA and has been authorized for detection and/or diagnosis of SARS-CoV-2 by FDA under an Emergency Use Authorization (EUA).  This EUA will remain in effect (meaning this test can be used) for the duration of the COVID-19 declaration under Section 564(b)(1) of the Act, 21 U.S.C. section 360bbb-3(b)(1), unless the authorization is terminated or revoked sooner. Performed at [facility], [place] [address], [place], [place] [address]   Culture, bal-quantitative     Status: None   Collection Time: 02/14/20  5:17 PM   Specimen: Bronchoalveolar Lavage; Respiratory  Result Value Ref Range Status   Specimen Description BRONCHIAL ALVEOLAR LAVAGE  Final    Special Requests Normal  Final   Gram Stain   Final    RARE WBC PRESENT,BOTH PMN AND MONONUCLEAR NO ORGANISMS SEEN  Culture   Final    NO GROWTH 2 DAYS Performed at [facility], [place] [address] [place], [place] [address]    Report Status 02/17/2020 FINAL  Final  Culture, blood (routine x 2)     Status: None   Collection Time: 02/14/20  7:20 PM   Specimen: BLOOD  Result Value Ref Range Status   Specimen Description BLOOD RIGHT ANTECUBITAL  Final   Special Requests   Final    BOTTLES DRAWN AEROBIC AND ANAEROBIC Blood Culture adequate volume   Culture   Final    NO GROWTH 5 DAYS Performed at [facility], [place] [address] [place], [place] [address]    Report Status 02/19/2020 FINAL  Final  MRSA PCR Screening     Status: None   Collection Time: 02/16/20  7:30 PM   Specimen: Nasal Mucosa; Nasopharyngeal  Result Value Ref Range Status   MRSA by PCR NEGATIVE NEGATIVE Final    Comment:        The GeneXpert MRSA Assay (FDA approved for NASAL specimens only), is one component of a comprehensive MRSA colonization surveillance program. It is not intended to diagnose MRSA infection nor to guide or monitor treatment for MRSA infections. Performed at [facility], [place] [address] [place], [place] [address]   Culture, blood (Routine X 2) w Reflex to ID Panel     Status: None (Preliminary result)   Collection Time: 02/17/20  6:26 AM   Specimen: BLOOD RIGHT WRIST  Result Value Ref Range Status   Specimen Description BLOOD RIGHT WRIST  Final   Special Requests AEROBIC BOTTLE ONLY Blood Culture adequate volume  Final   Culture  Setup Time   Final    GRAM POSITIVE COCCI IN CLUSTERS AEROBIC BOTTLE ONLY CRITICAL RESULT CALLED TO, READ BACK BY AND VERIFIED WITH: Annett Gula [id number] 02/19/2020 Mena Goes Performed at [facility], [place] [address], [place], [place] [address]    Culture GRAM POSITIVE COCCI  Final   Report Status PENDING  Incomplete  Culture, blood  (Routine X 2) w Reflex to ID Panel     Status: None (Preliminary result)   Collection Time: 02/17/20  6:26 AM   Specimen: BLOOD RIGHT HAND  Result Value Ref Range Status   Specimen Description BLOOD RIGHT HAND  Final   Special Requests AEROBIC BOTTLE ONLY Blood Culture adequate volume  Final   Culture   Final    NO GROWTH 2 DAYS Performed at [facility], [place] [address], [place], [place] [address]    Report Status PENDING  Incomplete  Blood Culture ID Panel (Reflexed)     Status: Abnormal   Collection Time: 02/17/20  6:26 AM  Result Value Ref Range Status   Enterococcus species NOT DETECTED NOT DETECTED Final   Listeria monocytogenes NOT DETECTED NOT DETECTED Final   Staphylococcus species DETECTED (A) NOT DETECTED Final    Comment: Methicillin (oxacillin) resistant coagulase negative staphylococcus. Possible blood culture contaminant (unless isolated from more than one blood culture draw or clinical case suggests pathogenicity). No antibiotic treatment is indicated for blood  culture contaminants. CRITICAL RESULT CALLED TO, READ BACK BY AND VERIFIED WITH: K. HURTH,PHARMD [id number] 02/19/2020 T. TYSOR    Staphylococcus aureus (BCID) NOT DETECTED NOT DETECTED Final   Methicillin resistance DETECTED (A) NOT DETECTED Final    Comment: CRITICAL RESULT CALLED TO, READ BACK BY AND VERIFIED WITH: K. HURTH,PHARMD [id number] 02/19/2020 T. TYSOR    Streptococcus species  NOT DETECTED NOT DETECTED Final   Streptococcus agalactiae NOT DETECTED NOT DETECTED Final   Streptococcus pneumoniae NOT DETECTED NOT DETECTED Final   Streptococcus pyogenes NOT DETECTED NOT DETECTED Final   Acinetobacter baumannii NOT DETECTED NOT DETECTED Final   Enterobacteriaceae species NOT DETECTED NOT DETECTED Final   Enterobacter cloacae complex NOT DETECTED NOT DETECTED Final   Escherichia coli NOT DETECTED NOT DETECTED Final   Klebsiella oxytoca NOT DETECTED NOT DETECTED Final   Klebsiella pneumoniae NOT DETECTED NOT  DETECTED Final   Proteus species NOT DETECTED NOT DETECTED Final   Serratia marcescens NOT DETECTED NOT DETECTED Final   Haemophilus influenzae NOT DETECTED NOT DETECTED Final   Neisseria meningitidis NOT DETECTED NOT DETECTED Final   Pseudomonas aeruginosa NOT DETECTED NOT DETECTED Final   Candida albicans NOT DETECTED NOT DETECTED Final   Candida glabrata NOT DETECTED NOT DETECTED Final   Candida krusei NOT DETECTED NOT DETECTED Final   Candida parapsilosis NOT DETECTED NOT DETECTED Final   Candida tropicalis NOT DETECTED NOT DETECTED Final    Comment: Performed at [facility], [place] [address] [place], [place] [address]  Culture, respiratory (non-expectorated)     Status: None (Preliminary result)   Collection Time: 02/17/20  7:30 AM   Specimen: Tracheal Aspirate; Respiratory  Result Value Ref Range Status   Specimen Description TRACHEAL ASPIRATE  Final   Special Requests Normal  Final   Gram Stain   Final    RARE WBC PRESENT, PREDOMINANTLY PMN NO ORGANISMS SEEN    Culture   Final    CULTURE REINCUBATED FOR BETTER GROWTH Performed at [facility], [place] [address], [place], [place] [address]    Report Status PENDING  Incomplete         Radiology Studies: No results found.   Scheduled Meds: . chlorhexidine gluconate (MEDLINE KIT)  15 mL Mouth Rinse BID  . Chlorhexidine Gluconate Cloth  6 each Topical Daily  . Chlorhexidine Gluconate Cloth  6 each Topical Q0600  . Chlorhexidine Gluconate Cloth  6 each Topical Q0600  . docusate  100 mg Per Tube BID  . feeding supplement (PRO-STAT SUGAR FREE 64)  30 mL Per Tube BID  . heparin injection (subcutaneous)  5,000 Units Subcutaneous Q8H  . heparin sodium (porcine)      . insulin aspart  0-9 Units Subcutaneous Q4H  . mouth rinse  15 mL Mouth Rinse 10 times per day  . pantoprazole (PROTONIX) IV  40 mg Intravenous Q24H  . polyethylene glycol  17 g Per Tube Daily   Continuous Infusions: . sodium chloride Stopped  (02/16/20 1718)  . feeding supplement (VITAL 1.5 CAL) 1,000 mL (02/18/20 0514)     LOS: 5 days    Yaakov Guthrie, MD [facility]   To contact the attending provider between 7A-7P or the covering provider during after hours 7P-7A, please log into the web site [email or web address] and access using universal [place] password for that web site. If you do not have the password, please call the hospital operator.  02/19/2020, 8:54 AM

## 2020-02-19 NOTE — Progress Notes (Signed)
[name]   Subjective:   This is a 74 year old gentleman with a history of CVA benign plastic hypertrophy atrial fibrillation developed Covid pneumonia admitted to [facility].  Required ventilator support and eventually tracheostomy.  [facility] 11/21/2019.  Developed complications from UTI Pseudomonas pneumonia stenotrophomonas bacteremia.  Received IV vancomycin developed acute kidney injury started on dialysis 12/16/2019.  Has been dialysis dependent since.  Was admitted to [facility] with hemoptysis 02/14/2020.  Receives dialysis Monday Wednesday Friday  Some agitation requiring fentanyl.  Appears last dialysis treatment was 02/16/2020 with 2.3 L removed.  Blood pressure 125/70 pulse 100 temperature nine 9.4 O2 sats 97% 30% FiO2  Sodium 143 potassium 5.0 chloride 100 CO2 25 BUN 86 creatinine 3.75 glucose 154 calcium 9.9 hemoglobin 9.6 WBC 17.4  Insulin sliding scale, Protonix 40 mg every 24 hours   Objective:  Vital signs in last 24 hours:  Temp:  [99.4 F (37.4 C)-100.4 F (38 C)] 99.4 F (37.4 C) (05/24 0701) Pulse Rate:  [49-127] 100 (05/24 0701) Resp:  [18-41] 25 (05/24 0701) BP: (116-146)/(64-92) 125/70 (05/24 0701) SpO2:  [98 %-100 %] 99 % (05/24 0600) FiO2 (%):  [30 %] 30 % (05/24 0400) Weight:  [62 kg-64.7 kg] 64.7 kg (05/24 0701)  Weight change: 1.2 kg Filed Weights   02/18/20 0500 02/19/20 0500 02/19/20 0701  Weight: 60.8 kg 62 kg 64.7 kg    Intake/Output: I/O last 3 completed shifts: In: 75 [Other:90; NG/GT:1925] Out: 400 [Stool:400]   Intake/Output this shift:  No intake/output data recorded.  Genelderly AAM, awake , responsive No jvd or bruits Chestclear to A/P ant and lat RRRbounding precordium, no sig MRG Abd soft ntnd no mass or ascites +bs, +LLQ ostomy, +PEG tube GU normal male MS no joint effusions or deformity Ext1+ dependent edema Feet  in boots Neuro more responsive today , more alert  RIJ [facility]   Basic Metabolic Panel: Recent Labs  Lab 02/14/20 0556 02/14/20 1433 02/15/20 0501 02/15/20 0501 02/15/20 2003 02/16/20 0449 02/16/20 0449 02/17/20 0343 02/18/20 0415 02/19/20 0557  NA 138   < > 140  --   --  138  --  138 140 143  K 3.5   < > 3.7  --   --  4.5  --  3.7 4.0 5.0  CL 97*   < > 101  --   --  101  --  98 101 100  CO2 25   < > 27  --   --  23  --  _0 GLUCOSE 135*   < > 102*  --   --  86  --  164* 156* 154*  BUN 121*   < > 78*  --   --  84*  --  40* 62* 86*  CREATININE 2.50*   < > 2.09*  --   --  3.00*  --  1.97* 2.72* 3.75*  CALCIUM 10.8*   < > 10.1   < >  --  9.6   < > 9.2 9.2 9.9  MG  --   --  1.8  --   --  2.3  --  2.0  --   --   PHOS 2.3*  --  1.4*  --  4.6 5.8*  --  3.6  --   --    < > = values in this interval not displayed.    Liver Function Tests: Recent Labs  Lab 02/14/20 0556 02/14/20 1433  AST  --  29  ALT  --  24  ALKPHOS  --  116  BILITOT  --  1.1  PROT  --  8.3*  ALBUMIN 2.8* 3.4*   No results for input(s): LIPASE, AMYLASE in the last 168 hours. No results for input(s): AMMONIA in the last 168 hours.  CBC: Recent Labs  Lab 02/14/20 1433 02/14/20 1433 02/15/20 0501 02/16/20 0449 02/17/20 0343 02/18/20 0415 02/19/20 0557  WBC 16.1*   < > 17.5* 15.1* 17.0* 15.5* 17.4*  NEUTROABS 12.2*  --   --   --   --   --   --   HGB 7.7*   < > 6.7* 9.4* 9.9* 9.8* 9.6*  HCT 24.7*   < > 21.5* 29.4* 31.0* 30.4* 30.2*  MCV 85.8   < > 85.3 84.7 84.2 84.7 84.1  PLT 128*   < > 128* 133* 185 175 208   < > = values in this interval not displayed.    Cardiac Enzymes: No results for input(s): CKTOTAL, CKMB, CKMBINDEX, TROPONINI in the last 168 hours.  BNP: Invalid input(s): POCBNP  CBG: Recent Labs  Lab 02/18/20 1140 02/18/20 1526 02/18/20 1926 02/19/20 0003 02/19/20 0342  GLUCAP 161* 138* 154* 163* 159*    Microbiology: Results for orders placed or performed during the  hospital encounter of 02/14/20  Culture, blood (routine x 2)     Status: None (Preliminary result)   Collection Time: 02/14/20  2:55 PM   Specimen: BLOOD RIGHT WRIST  Result Value Ref Range Status   Specimen Description BLOOD RIGHT WRIST  Final   Special Requests   Final    BOTTLES DRAWN AEROBIC AND ANAEROBIC Blood Culture results may not be optimal due to an excessive volume of blood received in culture bottles   Culture   Final    NO GROWTH 4 DAYS Performed at [facility], [place] [address], [place], [place] [address]    Report Status PENDING  Incomplete  SARS Coronavirus 2 by RT PCR (hospital order, performed in [facility] hospital lab) Nasopharyngeal Nasopharyngeal Swab     Status: None   Collection Time: 02/14/20  3:00 PM   Specimen: Nasopharyngeal Swab  Result Value Ref Range Status   SARS Coronavirus 2 NEGATIVE NEGATIVE Final    Comment: (NOTE) SARS-CoV-2 target nucleic acids are NOT DETECTED. The SARS-CoV-2 RNA is generally detectable in upper and lower respiratory specimens during the acute phase of infection. The lowest concentration of SARS-CoV-2 viral copies this assay can detect is 250 copies / mL. A negative result does not preclude SARS-CoV-2 infection and should not be used as the sole basis for treatment or other patient management decisions.  A negative result may occur with improper specimen collection / handling, submission of specimen other than nasopharyngeal swab, presence of viral mutation(s) within the areas targeted by this assay, and inadequate number of viral copies (<250 copies / mL). A negative result must be combined with clinical observations, patient history, and epidemiological information. Fact Sheet for Patients:   StrictlyIdeas.no Fact Sheet for Healthcare Providers: BankingDealers.co.za This test is not yet approved or cleared  by the [place] FDA and has been authorized for detection  and/or diagnosis of SARS-CoV-2 by FDA under an Emergency Use Authorization (EUA).  This EUA will remain in effect (meaning this test can be used) for the duration of the COVID-19 declaration under Section 564(b)(1) of the Act, 21 U.S.C. section 360bbb-3(b)(1), unless the  authorization is terminated or revoked sooner. Performed at [facility], [place] [address], [place], [place] [address]   Culture, bal-quantitative     Status: None   Collection Time: 02/14/20  5:17 PM   Specimen: Bronchoalveolar Lavage; Respiratory  Result Value Ref Range Status   Specimen Description BRONCHIAL ALVEOLAR LAVAGE  Final   Special Requests Normal  Final   Gram Stain   Final    RARE WBC PRESENT,BOTH PMN AND MONONUCLEAR NO ORGANISMS SEEN    Culture   Final    NO GROWTH 2 DAYS Performed at [facility], [address] [place], [place] [address]    Report Status 02/17/2020 FINAL  Final  Culture, blood (routine x 2)     Status: None (Preliminary result)   Collection Time: 02/14/20  7:20 PM   Specimen: BLOOD  Result Value Ref Range Status   Specimen Description BLOOD RIGHT ANTECUBITAL  Final   Special Requests   Final    BOTTLES DRAWN AEROBIC AND ANAEROBIC Blood Culture adequate volume   Culture   Final    NO GROWTH 4 DAYS Performed at [facility], [place] [address], [place], [place] [address]    Report Status PENDING  Incomplete  MRSA PCR Screening     Status: None   Collection Time: 02/16/20  7:30 PM   Specimen: Nasal Mucosa; Nasopharyngeal  Result Value Ref Range Status   MRSA by PCR NEGATIVE NEGATIVE Final    Comment:        The GeneXpert MRSA Assay (FDA approved for NASAL specimens only), is one component of a comprehensive MRSA colonization surveillance program. It is not intended to diagnose MRSA infection nor to guide or monitor treatment for MRSA infections. Performed at [facility], [place] [address], [place], [place] [address]   Culture, blood (Routine X 2) w  Reflex to ID Panel     Status: None (Preliminary result)   Collection Time: 02/17/20  6:26 AM   Specimen: BLOOD RIGHT WRIST  Result Value Ref Range Status   Specimen Description BLOOD RIGHT WRIST  Final   Special Requests AEROBIC BOTTLE ONLY Blood Culture adequate volume  Final   Culture  Setup Time   Final    GRAM POSITIVE COCCI IN CLUSTERS AEROBIC BOTTLE ONLY CRITICAL RESULT CALLED TO, READ BACK BY AND VERIFIED WITH: Annett Gula [id number] 02/19/2020 Mena Goes Performed at [facility], [place] [address] [place], [place] [address]    Culture GRAM POSITIVE COCCI  Final   Report Status PENDING  Incomplete  Culture, blood (Routine X 2) w Reflex to ID Panel     Status: None (Preliminary result)   Collection Time: 02/17/20  6:26 AM   Specimen: BLOOD RIGHT HAND  Result Value Ref Range Status   Specimen Description BLOOD RIGHT HAND  Final   Special Requests AEROBIC BOTTLE ONLY Blood Culture adequate volume  Final   Culture   Final    NO GROWTH 1 DAY Performed at [facility], [place] [address] [place], [place] [address]    Report Status PENDING  Incomplete  Blood Culture ID Panel (Reflexed)     Status: Abnormal   Collection Time: 02/17/20  6:26 AM  Result Value Ref Range Status   Enterococcus species NOT DETECTED NOT DETECTED Final   Listeria monocytogenes NOT DETECTED NOT DETECTED Final   Staphylococcus species DETECTED (A) NOT DETECTED Final    Comment: Methicillin (oxacillin) resistant coagulase negative staphylococcus. Possible  blood culture contaminant (unless isolated from more than one blood culture draw or clinical case suggests pathogenicity). No antibiotic treatment is indicated for blood  culture contaminants. CRITICAL RESULT CALLED TO, READ BACK BY AND VERIFIED WITH: K. HURTH,PHARMD [id number] 02/19/2020 T. TYSOR    Staphylococcus aureus (BCID) NOT DETECTED NOT DETECTED Final   Methicillin resistance DETECTED (A) NOT DETECTED Final    Comment: CRITICAL RESULT CALLED TO,  READ BACK BY AND VERIFIED WITH: K. HURTH,PHARMD [id number] 02/19/2020 T. TYSOR    Streptococcus species NOT DETECTED NOT DETECTED Final   Streptococcus agalactiae NOT DETECTED NOT DETECTED Final   Streptococcus pneumoniae NOT DETECTED NOT DETECTED Final   Streptococcus pyogenes NOT DETECTED NOT DETECTED Final   Acinetobacter baumannii NOT DETECTED NOT DETECTED Final   Enterobacteriaceae species NOT DETECTED NOT DETECTED Final   Enterobacter cloacae complex NOT DETECTED NOT DETECTED Final   Escherichia coli NOT DETECTED NOT DETECTED Final   Klebsiella oxytoca NOT DETECTED NOT DETECTED Final   Klebsiella pneumoniae NOT DETECTED NOT DETECTED Final   Proteus species NOT DETECTED NOT DETECTED Final   Serratia marcescens NOT DETECTED NOT DETECTED Final   Haemophilus influenzae NOT DETECTED NOT DETECTED Final   Neisseria meningitidis NOT DETECTED NOT DETECTED Final   Pseudomonas aeruginosa NOT DETECTED NOT DETECTED Final   Candida albicans NOT DETECTED NOT DETECTED Final   Candida glabrata NOT DETECTED NOT DETECTED Final   Candida krusei NOT DETECTED NOT DETECTED Final   Candida parapsilosis NOT DETECTED NOT DETECTED Final   Candida tropicalis NOT DETECTED NOT DETECTED Final    Comment: Performed at [facility], [place]. [address], [place], [place] [address]  Culture, respiratory (non-expectorated)     Status: None (Preliminary result)   Collection Time: 02/17/20  7:30 AM   Specimen: Tracheal Aspirate; Respiratory  Result Value Ref Range Status   Specimen Description TRACHEAL ASPIRATE  Final   Special Requests Normal  Final   Gram Stain   Final    RARE WBC PRESENT, PREDOMINANTLY PMN NO ORGANISMS SEEN    Culture   Final    CULTURE REINCUBATED FOR BETTER GROWTH Performed at [facility], [place] [address], [place], [place] [address]    Report Status PENDING  Incomplete    Coagulation Studies: No results for input(s): LABPROT, INR in the last 72 hours.  Urinalysis: No results for  input(s): COLORURINE, LABSPEC, PHURINE, GLUCOSEU, HGBUR, BILIRUBINUR, KETONESUR, PROTEINUR, UROBILINOGEN, NITRITE, LEUKOCYTESUR in the last 72 hours.  Invalid input(s): APPERANCEUR    Imaging: No results found.   Medications:   . sodium chloride Stopped (02/16/20 1718)  . feeding supplement (VITAL 1.5 CAL) 1,000 mL (02/18/20 0514)   . chlorhexidine gluconate (MEDLINE KIT)  15 mL Mouth Rinse BID  . Chlorhexidine Gluconate Cloth  6 each Topical Daily  . Chlorhexidine Gluconate Cloth  6 each Topical Q0600  . Chlorhexidine Gluconate Cloth  6 each Topical Q0600  . docusate  100 mg Per Tube BID  . feeding supplement (PRO-STAT SUGAR FREE 64)  30 mL Per Tube BID  . heparin injection (subcutaneous)  5,000 Units Subcutaneous Q8H  . heparin sodium (porcine)      . insulin aspart  0-9 Units Subcutaneous Q4H  . mouth rinse  15 mL Mouth Rinse 10 times per day  . pantoprazole (PROTONIX) IV  40 mg Intravenous Q24H  . polyethylene glycol  17 g Per Tube Daily   sodium chloride, acetaminophen (TYLENOL) oral liquid 160 mg/5 mL, fentaNYL (SUBLIMAZE) injection, fentaNYL (SUBLIMAZE) injection  Assessment/ Plan:  1. Hemoptysis - in pt w/ trach at [facility], admitted here for bleeding from trach site on 5/19. Seems to be resolved.  Per CCM.  2. Acute/chronic resp failure - sp COVID pna earlier this year, also course at [facility] complicated by multiple infections/ PNA. Sp trach.  3. 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 Getting HD there MWF schedule, [facility]IJ TDC.  Next dialysis treatment planned for 02/19/2020 4. BP/ volume excess - sig pitting leg edema bilat, wt's down 2-3kg here. UF 2-3 L w/ HD tomorrow as tol 5. AMS -mental status close to baseline 6. Atrial fib - per primary 7. Anemia ckd / abl - Hb 6.7 > 2u prbcs > 9.4 8. Nutrition - on PEG feeds, +diverting colostomy 9. Sacral decub - per primary, WOC 10. SP COVID +pna - earlier this year  77. Dispo - stable for dc from renal  standpoint    LOS: [place] _0 _1 :06 AM

## 2020-02-19 NOTE — TOC Initial Note (Addendum)
Transition of Care [facility]) - Initial/Assessment Note    Patient Details  Name: Shane Banks MRN: [id number] Date of Birth: 19-Mar-1946  [facility] CM/SW Contact:    Archie Endo, LCSW Phone Number: 02/19/2020, 11:29 AM  Clinical Narrative:                 CSW spoke with patient's sister Shane Banks to discuss the discharge plan. Shane Banks provided CSW with permission to initiate a bed search for a vent SNF. CSW explained to Shane Banks the difference between [facility] and vent SNF. Shane Banks stated no preference for facility but did request the patient remain in [place] if at all possible.  CSW will fax patient's clinicals to vent SNF's for review and will continue following for discharge planning.  Expected Discharge Plan: Long Term Acute Care (LTAC) Barriers to Discharge: Continued Medical Work up, SNF Pending bed offer   Patient Goals and CMS Choice        Expected Discharge Plan and Services  Expected discharge plan: Vent SNF     Living arrangements for the past 2 months: Post-Acute Facility                                      Prior Living Arrangements/Services Living arrangements for the past 2 months: [place] Lives with:: Self Patient language and need for interpreter reviewed:: Yes Do you feel safe going back to the place where you live?: Yes      Need for Family Participation in Patient Care: No (Comment) Care giver support system in place?: Yes (comment)   Criminal Activity/Legal Involvement Pertinent to Current Situation/Hospitalization: No - Comment as needed  Activities of Daily Living Home Assistive Devices/Equipment: Hoyer Lift ADL Screening (condition at time of admission) Patient's cognitive ability adequate to safely complete daily activities?: No Is the patient deaf or have difficulty hearing?: No Does the patient have difficulty seeing, even when wearing glasses/contacts?: Yes Does the patient have difficulty concentrating,  remembering, or making decisions?: Yes Patient able to express need for assistance with ADLs?: No Does the patient have difficulty dressing or bathing?: Yes Independently performs ADLs?: No Communication: Dependent Is this a change from baseline?: Pre-admission baseline Dressing (OT): Dependent Is this a change from baseline?: Pre-admission baseline Grooming: Dependent Is this a change from baseline?: Pre-admission baseline Feeding: Dependent Is this a change from baseline?: Pre-admission baseline Bathing: Dependent Is this a change from baseline?: Pre-admission baseline Toileting: Dependent Is this a change from baseline?: Pre-admission baseline In/Out Bed: Dependent Is this a change from baseline?: Pre-admission baseline Walks in Home: Dependent Is this a change from baseline?: Pre-admission baseline Does the patient have difficulty walking or climbing stairs?: Yes Weakness of Legs: Both Weakness of Arms/Hands: Both  Permission Sought/Granted                  Emotional Assessment Appearance:: Appears stated age   Affect (typically observed): Unable to Assess     Psych Involvement: No (comment)  Admission diagnosis:  Tracheostomy hemorrhage ([place]) [J95.01] HCAP (healthcare-associated pneumonia) [J18.9] Hemoptysis [R04.2] Severe comorbid illness [R69] Patient Active Problem List   Diagnosis Date Noted  . Protein-calorie malnutrition, severe 02/18/2020  . Hemoptysis 02/14/2020  . Pressure injury of skin 02/14/2020  . Tracheostomy hemorrhage ([place])   . Acute on chronic respiratory failure with hypoxia ([place])   . COVID-19 virus infection   . Atrial fibrillation with RVR ([place])   .  Severe sepsis ([place])   . Pneumonia due to COVID-19 virus    PCP:  [name], MD Pharmacy:  No Pharmacies Listed    Social Determinants of Health (SDOH) Interventions    Readmission Risk Interventions No flowsheet data found.

## 2020-02-19 NOTE — Progress Notes (Signed)
PHARMACY - PHYSICIAN COMMUNICATION CRITICAL VALUE ALERT - BLOOD CULTURE IDENTIFICATION (BCID)  Shane Banks is an 74 y.o. male who presented to [facility] on 02/14/2020 with a chief complaint of trach bleeding, transferred from [facility] to [facility].  Assessment: Has hx UTI, MDRO pseudomonas PNA and stenotrophomonas bacteremia folllowing COVID PNA requiring tracheostomy. WBC 15.5, temp 100.4. Bcx from 5/19 showing NGTD. BCx 1/2 collected on 5/22 growing coag neg staph, mecA detected.   Name of physician (or Provider) Contacted: Mickel Baas Gleason, PA-C  Current antibiotics: None  Changes to prescribed antibiotics recommended:  Possible contaminant - will hold on antibiotics and monitor clinical status at this time.   Results for orders placed or performed during the hospital encounter of 02/14/20  Blood Culture ID Panel (Reflexed) (Collected: 02/17/2020  6:26 AM)  Result Value Ref Range   Enterococcus species NOT DETECTED NOT DETECTED   Listeria monocytogenes NOT DETECTED NOT DETECTED   Staphylococcus species DETECTED (A) NOT DETECTED   Staphylococcus aureus (BCID) NOT DETECTED NOT DETECTED   Methicillin resistance DETECTED (A) NOT DETECTED   Streptococcus species NOT DETECTED NOT DETECTED   Streptococcus agalactiae NOT DETECTED NOT DETECTED   Streptococcus pneumoniae NOT DETECTED NOT DETECTED   Streptococcus pyogenes NOT DETECTED NOT DETECTED   Acinetobacter baumannii NOT DETECTED NOT DETECTED   Enterobacteriaceae species NOT DETECTED NOT DETECTED   Enterobacter cloacae complex NOT DETECTED NOT DETECTED   Escherichia coli NOT DETECTED NOT DETECTED   Klebsiella oxytoca NOT DETECTED NOT DETECTED   Klebsiella pneumoniae NOT DETECTED NOT DETECTED   Proteus species NOT DETECTED NOT DETECTED   Serratia marcescens NOT DETECTED NOT DETECTED   Haemophilus influenzae NOT DETECTED NOT DETECTED   Neisseria meningitidis NOT DETECTED NOT DETECTED   Pseudomonas aeruginosa NOT DETECTED NOT DETECTED   Candida  albicans NOT DETECTED NOT DETECTED   Candida glabrata NOT DETECTED NOT DETECTED   Candida krusei NOT DETECTED NOT DETECTED   Candida parapsilosis NOT DETECTED NOT DETECTED   Candida tropicalis NOT DETECTED NOT DETECTED    Antonietta Jewel, PharmD, BCCCP Clinical Pharmacist  02/19/2020 5:38 AM  Please check AMION for all [place] phone numbers After 10:00 PM, call [place] [phone]

## 2020-02-19 NOTE — Progress Notes (Addendum)
CSW faxed clinicals to [facility], [facility], and [facility] for review.  Madilyn Fireman, MSW, LCSW-A Transitions of Care  Clinical Social Worker  [facility] Emergency Departments  Medical ICU [phone]

## 2020-02-19 NOTE — NC FL2 (Signed)
[place] LEVEL OF CARE SCREENING TOOL     IDENTIFICATION  Patient Name: Shane Banks Birthdate: [birth year] Sex: male Admission Date (Current Location): 02/14/2020  [facility] and [place] Number:  Shane Banks and Address:  [facility], [place] [address], [place], [place] [address]      Provider Number: [id number]  Attending Physician Name and Address:  Yaakov Guthrie, MD  Relative Name and Phone Number:       Current Level of Care: Hospital Recommended Level of Care: Vent SNF Prior Approval Number:    Date Approved/Denied:   PASRR Number: [id number]  Discharge Plan: Hospital    Current Diagnoses: Patient Active Problem List   Diagnosis Date Noted  . Protein-calorie malnutrition, severe 02/18/2020  . Hemoptysis 02/14/2020  . Pressure injury of skin 02/14/2020  . 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 Acute on chronic respiratory failure with hypoxia ([place])   . COVID-19 virus infection   . Atrial fibrillation with RVR ([place])   . Severe sepsis ([place])   . Pneumonia due to COVID-19 virus     Orientation RESPIRATION BLADDER Height & Weight     (Currently unable to assess as patient is on ventilator)  Tracheostomy, Vent Incontinent Weight: 142 lb 10.2 oz (64.7 kg) Height:  _0  (175.3 cm)  BEHAVIORAL SYMPTOMS/MOOD NEUROLOGICAL BOWEL NUTRITION STATUS      Incontinent Feeding tube  AMBULATORY STATUS COMMUNICATION OF NEEDS Skin   Extensive Assist Non-Verbally PU Stage and Appropriate Care                       Personal Care Assistance Level of Assistance  Dressing, Feeding, Bathing Bathing Assistance: Maximum assistance Feeding assistance: Maximum assistance Dressing Assistance: Maximum assistance     Functional Limitations Info  Speech, Hearing, Sight Sight Info: Adequate Hearing Info: Adequate Speech Info: Impaired    SPECIAL CARE FACTORS FREQUENCY  PT (By licensed PT), OT (By licensed OT)     PT Frequency: 5x  weekly OT Frequency: 5x weekly            Contractures      Additional Factors Info  Allergies, Code Status Code Status Info: DNR Allergies Info: Aspirin, Penicillin, Cephalexin           Current Medications (02/19/2020):  This is the current hospital active medication list Current Facility-Administered Medications  Medication Dose Route Frequency Provider Last Rate Last Admin  . 0.9 %  sodium chloride infusion   Intravenous PRN Mannam, Praveen, MD   Stopped at 02/16/20 1718  . acetaminophen (TYLENOL) 160 MG/5ML solution 650 mg  650 mg Per Tube Q4H PRN Anders Simmonds, MD      . chlorhexidine gluconate (MEDLINE KIT) (PERIDEX) 0.12 % solution 15 mL  15 mL Mouth Rinse BID Ogan, [place]or[place]wo U, MD   15 mL at 02/19/20 0930  . Chlorhexidine Gluconate Cloth 2 % PADS 6 each  6 each Topical Daily Frederik Pear, MD   6 each at 02/18/20 2206  . Chlorhexidine Gluconate Cloth 2 % PADS 6 each  6 each Topical Q0600 Roney Jaffe, MD   6 each at 02/18/20 2205  . Chlorhexidine Gluconate Cloth 2 % PADS 6 each  6 each Topical Q0600 Roney Jaffe, MD      . diltiazem (CARDIZEM) tablet 30 mg  30 mg Oral Q8H Matcha, Anupama, MD      . docusate (COLACE) 50 MG/5ML liquid 100 mg  100 mg Per Tube  BID Blenda Nicely, RPH   100 mg at 02/19/20 5331  . feeding supplement (PRO-STAT SUGAR FREE 64) liquid 30 mL  30 mL Per Tube BID Blenda Nicely, RPH   30 mL at 02/19/20 0932  . feeding supplement (VITAL 1.5 CAL) liquid 1,000 mL  1,000 mL Per Tube Continuous Mannam, Praveen, MD 55 mL/hr at 02/18/20 0514 1,000 mL at 02/18/20 0514  . fentaNYL (SUBLIMAZE) injection 25 mcg  25 mcg Intravenous Q15 min PRN Corey Harold, NP   25 mcg at 02/14/20 2150  . fentaNYL (SUBLIMAZE) injection 25-100 mcg  25-100 mcg Intravenous Q30 min PRN Corey Harold, NP   100 mcg at 02/19/20 0458  . heparin injection 5,000 Units  5,000 Units Subcutaneous Q8H Mannam, Praveen, MD   5,000 Units at 02/19/20 0500  . heparin sodium  (porcine) 1000 UNIT/ML injection           . insulin aspart (novoLOG) injection 0-9 Units  0-9 Units Subcutaneous Q4H Anders Simmonds, MD   2 Units at 02/19/20 1148  . MEDLINE mouth rinse  15 mL Mouth Rinse 10 times per day Frederik Pear, MD   15 mL at 02/19/20 1149  . pantoprazole (PROTONIX) injection 40 mg  40 mg Intravenous Q24H Corey Harold, NP   40 mg at 02/18/20 1708  . polyethylene glycol (MIRALAX / GLYCOLAX) packet 17 g  17 g Per Tube Daily Matcha, Anupama, MD   17 g at 02/19/20 7409     Discharge Medications: Please see discharge summary for a list of discharge medications.  Relevant Imaging Results:  Relevant Lab Results:   Additional Information SSN: [id number] - Patient is Shane Banks Dialysis  Shane Endo, LCSW

## 2020-02-19 NOTE — Progress Notes (Signed)
NAME:  Shane Banks, MRN:  [id number], DOB:  03-04-1946, LOS: 5 ADMISSION DATE:  02/14/2020, CONSULTATION DATE:  5/19 REFERRING MD:  Dr. Alvino Chapel, CHIEF COMPLAINT:  Hemoptysis   Brief History   74 year old male. COVID ARDS in January. Unfortunately required trach and LTACH placement. Had been tolerating trach collar, but on 5/19 he developed hemoptysis and hypoxemic respiratory failure requiring vent.   History of present illness   74 year old male with past medical history as below, which is significant for Atrial fibrillation and stroke. He was admitted to [facility] for [place] 19 pneumonia, which unfortunately progressed to ARDS.  It seems as though his highest vent settings were 60% FiO2 and 8 of PEEP.  He had very little improvement in his ventilatory and oxygenation use over the course of his hospitalization and ultimately underwent tracheostomy and was discharged to [facility] in [place] on February 23. His course at [facility] was complicated by ongoing infections including UTI, MDRO pseudomonas pneumonia, and stenotrophomonas bacteremia. As a result of sepsis and shock he developed renal failure requiring dialysis, which he now receives M/W/F. In the morning hours oh 5/19 he developed hemoptysis. He had previously been taken off anticoagulation (for AF) due to anemia with positive hemoccult. Transferred to [facility] ED for evaluation of hemoptysis. Prior to the transfer he had been tolerating 28% ATC.   Past Medical History   has a past medical history of Acute on chronic respiratory failure with hypoxia ([place]), Atrial fibrillation with RVR ([place]), BPH (benign prostatic hyperplasia), COVID-19 virus infection, Pneumonia due to COVID-19 virus, Severe sepsis ([place]), and Stroke ([place]).   Significant Hospital Events   1/8  Admit to [facility] for COVID PNA 2/23 Transfer to [facility] on vent 5/19 Transfer to [facility] for hemoptysis. #6 cuffed trach placed. Back on  vent. Bronch performed 5/20 Transitioned to trach collar.   Consults:  ENT  Procedures:  ENT flex scope 5/19 > no bleeding source identified FOB 5/19 > no pulmonary bleeding source identified.   Significant Diagnostic Tests:  CXR 5/20 >> patchy bilateral infiltrates on left.   Micro Data:  BAL 5/19 > no growth 5/19 blood > NG 5/22 trach aspirate>> 5/22 blood>> 1/4 GPC  Antimicrobials:    Interim history/subjective:  Back on the vent all weekend- unknown reason. This morning failed SBT due to tachypnea.  Objective   Blood pressure 137/88, pulse (!) 106, temperature 98.8 F (37.1 C), temperature source Oral, resp. rate (!) 29, height _0  (1.753 m), weight 64.7 kg, SpO2 100 %.    Vent Mode: PRVC FiO2 (%):  [30 %] 30 % Set Rate:  [16 bmp] 16 bmp Vt Set:  [560 mL] 560 mL PEEP:  [5 cmH20] 5 cmH20 Plateau Pressure:  [36 cmH20-48 cmH20] 36 cmH20   Intake/Output Summary (Last 24 hours) at 02/19/2020 1436 Last data filed at 02/19/2020 1400 Gross per 24 hour  Intake 1410 ml  Output 2275 ml  Net -865 ml   Filed Weights   02/18/20 0500 02/19/20 0500 02/19/20 0701  Weight: 60.8 kg 62 kg 64.7 kg    Examination: Gen:      Frail appearing elderly man laying in bed in NAD, cachectic HEENT:  [place]/AT, eyes anicteric Neck:     Trach in place with clear thick secretions around site, no bleeding Lungs:    tachypneic breathing above the vent. CTAB. CV:          RRR, no murmurs  Abd:      Soft, NT, ND. PEG without erythema. Ext:    No edema. Minimal muscle mass. Skin:      Warm, dry. No rashes. Neuro:     Awake, looking around the room. Globally weak but moving extremities  Labs significant for BUN/creatinine 84/3, WBC 15.1, hemoglobin 9.4, platelets 133 Chest x-ray 5/21-stable lower lobe opacities.   Resolved Hospital Problem list     Assessment & Plan:   Acute on chronic hypoxemic respiratory failure requiring tracheostomy, prolonged MV: he has a trach s/p COVID pneumonia  earlier this year. His oxygenation had  improved to tolerate 28% ATC, but has been back on the vent for several days. Course since complicated by multiple pneumonias, which is seems he has complete tx for (MDRO pseudomonas treated with inhaled tobramycin). Now he has had hemoptysis and aspirated blood. Decompensated in ER in this setting. Hemoptysis seems to be originating from stoma. Ulceration?  Has a cuffed trach placed by ENT.  No active bleeding from the blood on bronchoscopy -Con't vent support, liberal vent PS trials with goal of getting back to ATC. -Con't to monitor cultures.  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 S/p pRBC transfusion. -con't to monitor -tranfuse for Hb <7 or hemodynamically significant bleeding  ESRD on HD:  -Dialysis per nephrology; most recently dialyzed this AM -renally dose meds -strict /os  Paroxysmal atrial fibrillation: sinus on telemetry now. Amiodarone stopped at [facility] due to bradycardia, anticoagulation stopped due to suspected GIB with several transfusions.  - Telemetry monitoring.  -Can consider restarting anticoagulation if he remains stable  Chronic leukocytosis; likely contaminant in blood cx -con't to monitor cultures -monitor clinically for possible signs of infection  Cachexia, severe protein energy malnutrition -con't TF via PEG  Sacral decub -WOC consult- appreciate their input   Best practice:  Diet: TF Pain/Anxiety/Delirium protocol (if indicated): PRN sedation for vent complaince VAP protocol (if indicated): Per protocol DVT prophylaxis: SCD, subcu heparin GI prophylaxis: PPI Glucose control: SSI Mobility: BR Code Status: DNR Family Communication: Sister updated 5/21 Disposition: Out of ICU   This patient is critically ill with multiple organ system failure which requires frequent high complexity decision making, assessment, support, evaluation, and titration of therapies. This was completed through the  application of advanced monitoring technologies and extensive interpretation of multiple databases. During this encounter critical care time was devoted to patient care services described in this note for 35 minutes.   Julian Hy, DO 02/19/20 3:29 PM [place] [facility] [facility]

## 2020-02-20 DIAGNOSIS — N186 End stage renal disease: Secondary | ICD-10-CM

## 2020-02-20 LAB — CBC
HCT: 32 % — ABNORMAL LOW (ref 39.0–52.0)
HCT: 30.8 % — ABNORMAL LOW (ref 39.0–52.0)
Hemoglobin: 10.2 g/dL — ABNORMAL LOW (ref 13.0–17.0)
Hemoglobin: 9.8 g/dL — ABNORMAL LOW (ref 13.0–17.0)
MCH: 27.3 pg (ref 26.0–34.0)
MCH: 26.8 pg (ref 26.0–34.0)
MCHC: 31.9 g/dL (ref 30.0–36.0)
MCHC: 31.8 g/dL (ref 30.0–36.0)
MCV: 85.8 fL (ref 80.0–100.0)
MCV: 84.4 fL (ref 80.0–100.0)
Platelets: 186 10*3/uL (ref 150–400)
Platelets: 193 10*3/uL (ref 150–400)
RBC: 3.73 MIL/uL — ABNORMAL LOW (ref 4.22–5.81)
RBC: 3.65 MIL/uL — ABNORMAL LOW (ref 4.22–5.81)
RDW: 17.2 % — ABNORMAL HIGH (ref 11.5–15.5)
RDW: 17.3 % — ABNORMAL HIGH (ref 11.5–15.5)
WBC: 19.5 10*3/uL — ABNORMAL HIGH (ref 4.0–10.5)
WBC: 22.1 10*3/uL — ABNORMAL HIGH (ref 4.0–10.5)
nRBC: 0 % (ref 0.0–0.2)
nRBC: 0 % (ref 0.0–0.2)

## 2020-02-20 LAB — RENAL FUNCTION PANEL
Albumin: 2.6 g/dL — ABNORMAL LOW (ref 3.5–5.0)
Anion gap: 19 — ABNORMAL HIGH (ref 5–15)
BUN: 76 mg/dL — ABNORMAL HIGH (ref 8–23)
CO2: 22 mmol/L (ref 22–32)
Calcium: 9.8 mg/dL (ref 8.9–10.3)
Chloride: 98 mmol/L (ref 98–111)
Creatinine, Ser: 3.2 mg/dL — ABNORMAL HIGH (ref 0.61–1.24)
GFR calc Af Amer: 21 mL/min — ABNORMAL LOW (ref >60)
GFR calc non Af Amer: 18 mL/min — ABNORMAL LOW (ref >60)
Glucose, Bld: 208 mg/dL — ABNORMAL HIGH (ref 70–99)
Phosphorus: 5 mg/dL — ABNORMAL HIGH (ref 2.5–4.6)
Potassium: 6 mmol/L — ABNORMAL HIGH (ref 3.5–5.1)
Sodium: 139 mmol/L (ref 135–145)

## 2020-02-20 LAB — BASIC METABOLIC PANEL
Anion gap: 16 — ABNORMAL HIGH (ref 5–15)
BUN: 64 mg/dL — ABNORMAL HIGH (ref 8–23)
CO2: 24 mmol/L (ref 22–32)
Calcium: 9.6 mg/dL (ref 8.9–10.3)
Chloride: 100 mmol/L (ref 98–111)
Creatinine, Ser: 2.98 mg/dL — ABNORMAL HIGH (ref 0.61–1.24)
GFR calc Af Amer: 23 mL/min — ABNORMAL LOW (ref >60)
GFR calc non Af Amer: 20 mL/min — ABNORMAL LOW (ref >60)
Glucose, Bld: 199 mg/dL — ABNORMAL HIGH (ref 70–99)
Potassium: 4.9 mmol/L (ref 3.5–5.1)
Sodium: 140 mmol/L (ref 135–145)

## 2020-02-20 LAB — GLUCOSE, CAPILLARY
Glucose-Capillary: 152 mg/dL — ABNORMAL HIGH (ref 70–99)
Glucose-Capillary: 163 mg/dL — ABNORMAL HIGH (ref 70–99)
Glucose-Capillary: 168 mg/dL — ABNORMAL HIGH (ref 70–99)
Glucose-Capillary: 171 mg/dL — ABNORMAL HIGH (ref 70–99)
Glucose-Capillary: 180 mg/dL — ABNORMAL HIGH (ref 70–99)
Glucose-Capillary: 197 mg/dL — ABNORMAL HIGH (ref 70–99)

## 2020-02-20 LAB — CULTURE, RESPIRATORY W GRAM STAIN: Special Requests: NORMAL

## 2020-02-20 MED ORDER — SODIUM ZIRCONIUM CYCLOSILICATE 5 G PO PACK
5.0000 g | PACK | Freq: Once | ORAL | Status: AC
  Administered 2020-02-20: 5 g
  Filled 2020-02-20: qty 1

## 2020-02-20 MED ORDER — LIDOCAINE-PRILOCAINE 2.5-2.5 % EX CREA
1.0000 "application " | TOPICAL_CREAM | CUTANEOUS | Status: DC | PRN
  Filled 2020-02-20: qty 5

## 2020-02-20 MED ORDER — SODIUM CHLORIDE 0.9 % IV SOLN: 100.0000 mL | INTRAVENOUS | Status: DC | PRN

## 2020-02-20 MED ORDER — LIDOCAINE HCL (PF) 1 % IJ SOLN: 5.0000 mL | INTRAMUSCULAR | Status: DC | PRN

## 2020-02-20 MED ORDER — VANCOMYCIN HCL 1250 MG/250ML IV SOLN
1250.0000 mg | Freq: Once | INTRAVENOUS | Status: AC
  Administered 2020-02-20: 1250 mg via INTRAVENOUS
  Filled 2020-02-20 (×2): qty 250

## 2020-02-20 MED ORDER — VANCOMYCIN HCL IN DEXTROSE 500-5 MG/100ML-% IV SOLN: 500.0000 mg | INTRAVENOUS | Status: DC

## 2020-02-20 MED ORDER — HEPARIN SODIUM (PORCINE) 1000 UNIT/ML DIALYSIS: 1000.0000 [IU] | INTRAMUSCULAR | Status: DC | PRN

## 2020-02-20 MED ORDER — CHLORHEXIDINE GLUCONATE CLOTH 2 % EX PADS
6.0000 | MEDICATED_PAD | Freq: Every day | CUTANEOUS | Status: DC
  Administered 2020-02-21 – 2020-02-25 (×4): 6 via TOPICAL

## 2020-02-20 MED ORDER — APIXABAN 2.5 MG PO TABS
2.5000 mg | ORAL_TABLET | Freq: Two times a day (BID) | ORAL | Status: DC
  Administered 2020-02-20 – 2020-02-22 (×4): 2.5 mg
  Filled 2020-02-20 (×5): qty 1

## 2020-02-20 MED ORDER — PENTAFLUOROPROP-TETRAFLUOROETH EX AERO: 1.0000 "application " | INHALATION_SPRAY | CUTANEOUS | Status: DC | PRN

## 2020-02-20 MED ORDER — LEVOFLOXACIN IN D5W 500 MG/100ML IV SOLN
500.0000 mg | INTRAVENOUS | Status: DC
  Administered 2020-02-20: 500 mg via INTRAVENOUS
  Filled 2020-02-20: qty 100

## 2020-02-20 MED ORDER — ALTEPLASE 2 MG IJ SOLR
2.0000 mg | Freq: Once | INTRAMUSCULAR | Status: AC | PRN
  Administered 2020-02-27: 2 mg
  Filled 2020-02-20: qty 2

## 2020-02-20 NOTE — Progress Notes (Signed)
Renal Navigator reviewed record and notes documentation by CSW of attempts to find placement for patient. Navigator discussed patient with CSW for continuity of care. It is noted that it will likely be very difficult to find a dialysis clinic to accept patient for OP HD treatment given that he has a trach/vent, even if patient can be placed in a SNF. CSW is aware and will keep Renal Navigator updated.  Alphonzo Cruise, [place] Renal Navigator [phone]

## 2020-02-20 NOTE — Progress Notes (Signed)
PROGRESS NOTE    Shane Banks  SPQ:[id number] DOB: 02-19-1946 DOA: 02/14/2020 PCP: [name], MD    Chief Complaint  Patient presents with  . trach bleeding    Brief Narrative:  Patient is a 74 year old male with past medical history of peripheral vascular disease, hypertension, cardiomyopathy, gout, and stroke. He was admitted to [facility] for [place] 19 pneumonia, which unfortunately progressed to ARDS.  It seems as though his highest vent settings were 60% FiO2 and 8 of PEEP.  He had very little improvement in his ventilatory and oxygenation use over the course of his hospitalization and ultimately underwent tracheostomy and was discharged to [facility] in [place] on February 23. 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 As a result of sepsis and shock he developed renal failure requiring dialysis, which he now receives M/W/F.  He was treated with multiple antibiotics including IV vancomycin, azithromycin, ceftriaxone, tigecycline, Fortaz, meropenem, Flagyl, tobramycin nebulizers, Avycaz, Levaquin, meropenem, Zyvox.  From infection standpoint he somewhat stabilized.  However, in the morning hours of 5/19 he developed hemoptysis. He had previously been taken off anticoagulation (for AF) due to anemia with positive hemoccult. Transferred to [facility] ED for evaluation of hemoptysis. Prior to the transfer he had been tolerating 28% ATC.  He was admitted to the ICU service. #6 cuffed trach placed. Back on vent. Bronch performed.  He had a anteflexed scope on 02/14/2020 with no bleeding source identified.  FOB on 5/19 also no bleeding source identified.  BAL did not show any growth.  5/20 Transitioned to trach collar. Transferred to the hospitalist service.   Assessment & Plan:   Active Problems:   Hemoptysis    Pressure injury of skin   Protein-calorie malnutrition, severe Acute on chronic hypoxemic respiratory failure: he has a trach s/p COVID infection earlier this year. His oxygenation had improved to tolerate 28% ATC.  He has had multiple pneumonias with different organisms.  He has been treated with multiple antibiotics including Fortaz, meropenem, cefepime, tobramycin nebulizers, vancomycin, azithromycin, ceftriaxone, tigecycline, Avycaz, meropenem, Flagyl.  More recently he had had pneumonia with MDRO Pseudomonas for which he was treated with Avycaz and tobramycin nebulizers at [facility].  Patient also likely has ongoing aspiration. Now he had hemoptysis and aspirated blood.  Per critical care note "Hemoptysis" seems to be originating from stoma. Ulceration?  Has a cuffed trach placed by ENT.  No active bleeding on bronchoscopy.    Fever: Chest x-ray per report persistent bilateral lower lobe infiltrates with small bilateral pleural effusions.  He has high suspicion for ongoing aspiration and at risk for aspiration pneumonia.  Therefore high risk for recurrent pneumonia. He has blood cultures showed coagulase-negative staph in 1 bottle.  Was holding antibiotics.  However, now having fever of 102 with worsening leukocytosis.  Therefore will start on Levaquin.  Low Hb Secondary to blood loss S/p PRBC.  Monitor CBC  ESRD on HD:  Dialysis per nephrology.  Atrial fibrillation:  He had sinus tachycardia here. Amiodarone stopped at [facility] due to bradycardia, anticoagulation stopped due to suspected GIB with several transfusions.  - His heart rate and blood pressure is fluctuating.  He is back in A. Fib.  -Since he had bradycardia with amiodarone started short-acting Cardizem. -ContinueTelemetry monitoring.   Consider restarting anticoagulation if he continues to be stable.  Nutrition: TF via PEG  Sacral  pressure ulcer unstageable: WOC consulted  DVT prophylaxis: Subcutaneous heparin Code  Status: DNR Family Communication: No family at bedside Disposition: Likely to LTAC?  Status is: Inpatient              Anticipated d/c date is: Unknown at this time              Patient currently is medically stable to d/c.    Consultants:   Nephrology, ENT   Procedures:  ENT flex scope 5/19 > no bleeding source identified FOB 5/19 > no pulmonary bleeding source identified.   Antimicrobials:   None    Subjective: He is opening eyes. Heart rate and blood pressure fluctuating.  T-max 102.1.  Also having worsening leukocytosis.  WBC count 19.5. No further bleeding from around the trach but having increased secretions.  Objective: Vitals:   02/20/20 0500 02/20/20 0600 02/20/20 0610 02/20/20 0714  BP: 106/60 126/78 121/67   Pulse: 77 77    Resp: (!) 26 (!) 21    Temp:    98.8 F (37.1 C)  TempSrc:    Axillary  SpO2: 100% 98%    Weight: 58.6 kg     Height:        Intake/Output Summary (Last 24 hours) at 02/20/2020 0916 Last data filed at 02/20/2020 0600 Gross per 24 hour  Intake 1155 ml  Output 2425 ml  Net -1270 ml   Filed Weights   02/19/20 0500 02/19/20 0701 02/20/20 0500  Weight: 62 kg 64.7 kg 58.6 kg    Examination:  General exam: Frail, ill-appearing male, opening eyes, following few commands  Respiratory system: Occasional rhonchi, no wheezing, has trach in place, trach site looks okay Cardiovascular system: Irregularly irregular, No murmur Gastrointestinal system: Abdomen is nondistended, soft and nontender.  Has ostomy, normal bowel sounds Central nervous system: He is opening eyes, following few commands.  Has debility with severe generalized weakness. Extremities: No lower extremity edema. Skin: Unstageable sacral pressure ulcer Psychiatry: Not agitated   Data Reviewed: I have personally reviewed following labs and imaging studies  CBC: Recent Labs  Lab 02/14/20 1433 02/15/20 0501 02/16/20 0449 02/17/20 0343 02/18/20 0415 02/19/20 0557  02/20/20 0601  WBC 16.1*   < > 15.1* 17.0* 15.5* 17.4* 19.5*  NEUTROABS 12.2*  --   --   --   --   --   --   HGB 7.7*   < > 9.4* 9.9* 9.8* 9.6* 10.2*  HCT 24.7*   < > 29.4* 31.0* 30.4* 30.2* 32.0*  MCV 85.8   < > 84.7 84.2 84.7 84.1 85.8  PLT 128*   < > 133* 185 175 208 186   < > = values in this interval not displayed.    Basic Metabolic Panel: Recent Labs  Lab 02/14/20 0556 02/14/20 1433 02/15/20 0501 02/15/20 0501 02/15/20 2003 02/16/20 0449 02/17/20 0343 02/18/20 0415 02/19/20 0557 02/20/20 0601  NA 138   < > 140   < >  --  138 138 140 143 140  K 3.5   < > 3.7   < >  --  4.5 3.7 4.0 5.0 4.9  CL 97*   < > 101   < >  --  101 98 101 100 100  CO2 25   < > 27   < >  --  _0 GLUCOSE 135*   < > 102*   < >  --  86 164* 156* 154* 199*  BUN 121*   < >  78*   < >  --  84* 40* 62* 86* 64*  CREATININE 2.50*   < > 2.09*   < >  --  3.00* 1.97* 2.72* 3.75* 2.98*  CALCIUM 10.8*   < > 10.1   < >  --  9.6 9.2 9.2 9.9 9.6  MG  --   --  1.8  --   --  2.3 2.0  --   --   --   PHOS 2.3*  --  1.4*  --  4.6 5.8* 3.6  --   --   --    < > = values in this interval not displayed.    GFR: Estimated Creatinine Clearance: 18.6 mL/min (A) (by C-G formula based on SCr of 2.98 mg/dL (H)).  Liver Function Tests: Recent Labs  Lab 02/14/20 0556 02/14/20 1433  AST  --  29  ALT  --  24  ALKPHOS  --  116  BILITOT  --  1.1  PROT  --  8.3*  ALBUMIN 2.8* 3.4*    CBG: Recent Labs  Lab 02/19/20 1506 02/19/20 1939 02/19/20 2324 02/20/20 0354 02/20/20 0712  GLUCAP 167* 155* 137* 152* 180*     Recent Results (from the past 240 hour(s))  Culture, blood (routine x 2)     Status: None   Collection Time: 02/14/20  2:55 PM   Specimen: BLOOD RIGHT WRIST  Result Value Ref Range Status   Specimen Description BLOOD RIGHT WRIST  Final   Special Requests   Final    BOTTLES DRAWN AEROBIC AND ANAEROBIC Blood Culture results may not be optimal due to an excessive volume of blood received in  culture bottles   Culture   Final    NO GROWTH 5 DAYS Performed at [facility], [place] [address] [place], [place] [address]    Report Status 02/19/2020 FINAL  Final  SARS Coronavirus 2 by RT PCR (hospital order, performed in [facility] hospital lab) Nasopharyngeal Nasopharyngeal Swab     Status: None   Collection Time: 02/14/20  3:00 PM   Specimen: Nasopharyngeal Swab  Result Value Ref Range Status   SARS Coronavirus 2 NEGATIVE NEGATIVE Final    Comment: (NOTE) SARS-CoV-2 target nucleic acids are NOT DETECTED. The SARS-CoV-2 RNA is generally detectable in upper and lower respiratory specimens during the acute phase of infection. The lowest concentration of SARS-CoV-2 viral copies this assay can detect is 250 copies / mL. A negative result does not preclude SARS-CoV-2 infection and should not be used as the sole basis for treatment or other patient management decisions.  A negative result may occur with improper specimen collection / handling, submission of specimen other than nasopharyngeal swab, presence of viral mutation(s) within the areas targeted by this assay, and inadequate number of viral copies (<250 copies / mL). A negative result must be combined with clinical observations, patient history, and epidemiological information. Fact Sheet for Patients:   StrictlyIdeas.no Fact Sheet for Healthcare Providers: BankingDealers.co.za This test is not yet approved or cleared  by the [place] FDA and has been authorized for detection and/or diagnosis of SARS-CoV-2 by FDA under an Emergency Use Authorization (EUA).  This EUA will remain in effect (meaning this test can be used) for the duration of the COVID-19 declaration under Section 564(b)(1) of the Act, 21 U.S.C. section 360bbb-3(b)(1), unless the authorization is terminated or revoked sooner. Performed at [facility], [place] [address] [place], [place] [address]     Culture,  bal-quantitative     Status: None   Collection Time: 02/14/20  5:17 PM   Specimen: Bronchoalveolar Lavage; Respiratory  Result Value Ref Range Status   Specimen Description BRONCHIAL ALVEOLAR LAVAGE  Final   Special Requests Normal  Final   Gram Stain   Final    RARE WBC PRESENT,BOTH PMN AND MONONUCLEAR NO ORGANISMS SEEN    Culture   Final    NO GROWTH 2 DAYS Performed at [facility], [address] [place], [place] [address]    Report Status 02/17/2020 FINAL  Final  Culture, blood (routine x 2)     Status: None   Collection Time: 02/14/20  7:20 PM   Specimen: BLOOD  Result Value Ref Range Status   Specimen Description BLOOD RIGHT ANTECUBITAL  Final   Special Requests   Final    BOTTLES DRAWN AEROBIC AND ANAEROBIC Blood Culture adequate volume   Culture   Final    NO GROWTH 5 DAYS Performed at [facility], [place] [address] [place], [place] [address]    Report Status 02/19/2020 FINAL  Final  MRSA PCR Screening     Status: None   Collection Time: 02/16/20  7:30 PM   Specimen: Nasal Mucosa; Nasopharyngeal  Result Value Ref Range Status   MRSA by PCR NEGATIVE NEGATIVE Final    Comment:        The GeneXpert MRSA Assay (FDA approved for NASAL specimens only), is one component of a comprehensive MRSA colonization surveillance program. It is not intended to diagnose MRSA infection nor to guide or monitor treatment for MRSA infections. Performed at [facility], [place] [address], [place], [place] [address]   Culture, blood (Routine X 2) w Reflex to ID Panel     Status: None (Preliminary result)   Collection Time: 02/17/20  6:26 AM   Specimen: BLOOD RIGHT WRIST  Result Value Ref Range Status   Specimen Description BLOOD RIGHT WRIST  Final   Special Requests AEROBIC BOTTLE ONLY Blood Culture adequate volume  Final   Culture  Setup Time   Final    GRAM POSITIVE COCCI IN CLUSTERS AEROBIC BOTTLE ONLY CRITICAL RESULT CALLED TO, READ BACK BY AND  VERIFIED WITH: Annett Gula 0938 02/19/2020 Mena Goes Performed at [facility], [place] [address] [place], [place] [address]    Culture GRAM POSITIVE COCCI  Final   Report Status PENDING  Incomplete  Culture, blood (Routine X 2) w Reflex to ID Panel     Status: None (Preliminary result)   Collection Time: 02/17/20  6:26 AM   Specimen: BLOOD RIGHT HAND  Result Value Ref Range Status   Specimen Description BLOOD RIGHT HAND  Final   Special Requests AEROBIC BOTTLE ONLY Blood Culture adequate volume  Final   Culture   Final    NO GROWTH 2 DAYS Performed at [facility], [place] [address] [place], [place] [address]    Report Status PENDING  Incomplete  Blood Culture ID Panel (Reflexed)     Status: Abnormal   Collection Time: 02/17/20  6:26 AM  Result Value Ref Range Status   Enterococcus species NOT DETECTED NOT DETECTED Final   Listeria monocytogenes NOT DETECTED NOT DETECTED Final   Staphylococcus species DETECTED (A) NOT DETECTED Final    Comment: Methicillin (oxacillin) resistant coagulase negative staphylococcus. Possible blood culture contaminant (unless isolated from more than one blood culture draw or clinical case suggests pathogenicity). No antibiotic treatment is indicated for  blood  culture contaminants. CRITICAL RESULT CALLED TO, READ BACK BY AND VERIFIED WITH: K. HURTH,PHARMD [id number] 02/19/2020 T. TYSOR    Staphylococcus aureus (BCID) NOT DETECTED NOT DETECTED Final   Methicillin resistance DETECTED (A) NOT DETECTED Final    Comment: CRITICAL RESULT CALLED TO, READ BACK BY AND VERIFIED WITH: K. HURTH,PHARMD [id number] 02/19/2020 T. TYSOR    Streptococcus species NOT DETECTED NOT DETECTED Final   Streptococcus agalactiae NOT DETECTED NOT DETECTED Final   Streptococcus pneumoniae NOT DETECTED NOT DETECTED Final   Streptococcus pyogenes NOT DETECTED NOT DETECTED Final   Acinetobacter baumannii NOT DETECTED NOT DETECTED Final   Enterobacteriaceae species NOT DETECTED NOT  DETECTED Final   Enterobacter cloacae complex NOT DETECTED NOT DETECTED Final   Escherichia coli NOT DETECTED NOT DETECTED Final   Klebsiella oxytoca NOT DETECTED NOT DETECTED Final   Klebsiella pneumoniae NOT DETECTED NOT DETECTED Final   Proteus species NOT DETECTED NOT DETECTED Final   Serratia marcescens NOT DETECTED NOT DETECTED Final   Haemophilus influenzae NOT DETECTED NOT DETECTED Final   Neisseria meningitidis NOT DETECTED NOT DETECTED Final   Pseudomonas aeruginosa NOT DETECTED NOT DETECTED Final   Candida albicans NOT DETECTED NOT DETECTED Final   Candida glabrata NOT DETECTED NOT DETECTED Final   Candida krusei NOT DETECTED NOT DETECTED Final   Candida parapsilosis NOT DETECTED NOT DETECTED Final   Candida tropicalis NOT DETECTED NOT DETECTED Final    Comment: Performed at [facility], [place]. [address] [place], [place] [address]  Culture, respiratory (non-expectorated)     Status: None   Collection Time: 02/17/20  7:30 AM   Specimen: Tracheal Aspirate; Respiratory  Result Value Ref Range Status   Specimen Description TRACHEAL ASPIRATE  Final   Special Requests Normal  Final   Gram Stain   Final    RARE WBC PRESENT, PREDOMINANTLY PMN NO ORGANISMS SEEN Performed at [facility], [place] [address] [place], [place] [address]    Culture RARE CANDIDA PARAPSILOSIS  Final   Report Status 02/20/2020 FINAL  Final         Radiology Studies: DG CHEST PORT 1 VIEW  Result Date: 02/19/2020 CLINICAL DATA:  Hypoxia and tachycardia. Ventilator patient. EXAM: PORTABLE CHEST 1 VIEW COMPARISON:  Radiographs 02/16/2020 and 02/15/2020. Radiographs 12/21/2019. FINDINGS: 1543 hours. Tracheostomy and right IJ hemodialysis catheters appear unchanged. The heart size and mediastinal contours are stable with aortic atherosclerosis. Interval improvement in the previously demonstrated left lower lobe consolidation. Patchy airspace opacities in both lungs are otherwise stable. No  pneumothorax or significant pleural effusion. The bones appear unchanged. IMPRESSION: 1. Interval improvement in left lower lobe infiltrate. The patchy bilateral airspace opacities are otherwise unchanged, likely related to the patient's viral pneumonia. 2. Stable support system. 3. No acute findings identified. Electronically Signed   By: Richardean Sale M.D.   On: 02/19/2020 16:04     Scheduled Meds: . chlorhexidine gluconate (MEDLINE KIT)  15 mL Mouth Rinse BID  . Chlorhexidine Gluconate Cloth  6 each Topical Daily  . Chlorhexidine Gluconate Cloth  6 each Topical Q0600  . Chlorhexidine Gluconate Cloth  6 each Topical Q0600  . Chlorhexidine Gluconate Cloth  6 each Topical Q0600  . diltiazem  30 mg Oral Q8H  . docusate  100 mg Per Tube BID  . feeding supplement (PRO-STAT SUGAR FREE 64)  30 mL Per Tube BID  . heparin injection (subcutaneous)  5,000 Units Subcutaneous Q8H  . insulin aspart  0-9 Units Subcutaneous Q4H  .  mouth rinse  15 mL Mouth Rinse 10 times per day  . pantoprazole (PROTONIX) IV  40 mg Intravenous Q24H  . polyethylene glycol  17 g Per Tube Daily   Continuous Infusions: . sodium chloride Stopped (02/16/20 1718)  . feeding supplement (VITAL 1.5 CAL) 1,000 mL (02/19/20 2137)     LOS: 6 days    Yaakov Guthrie, MD [facility]   To contact the attending provider between 7A-7P or the covering provider during after hours 7P-7A, please log into the web site [email or web address] and access using universal [place] password for that web site. If you do not have the password, please call the hospital operator.  02/20/2020, 9:16 AM

## 2020-02-20 NOTE — Progress Notes (Signed)
[name]   Subjective:   This is a 74 year old gentleman with a history of CVA benign plastic hypertrophy atrial fibrillation developed Covid pneumonia admitted to [facility].  Required ventilator support and eventually tracheostomy.  [facility] 11/21/2019.  Developed complications from UTI Pseudomonas pneumonia stenotrophomonas bacteremia.  Received IV vancomycin developed acute kidney injury started on dialysis 12/16/2019.  Has been dialysis dependent since.  Was admitted to [facility] with hemoptysis 02/14/2020.  Receives dialysis Monday Wednesday Friday  Removal 2 L 02/19/2020  Blood pressure 121/61 pulse 78 temperature 9.1 O2 sats 97% FiO2 30%  Sodium 140 potassium 4.9 chloride 100 CO2 24 BUN 64 creatinine 2.98 glucose 199 calcium 9.6 hemoglobin 10.2 WBC 19.5  Insulin sliding scale, Protonix 40 mg every 24 hours   Objective:  Vital signs in last 24 hours:  Temp:  [98.7 F (37.1 C)-102.1 F (38.9 C)] 98.8 F (37.1 C) (05/25 0714) Pulse Rate:  [52-116] 78 (05/25 0100) Resp:  [24-42] 27 (05/25 0100) BP: (88-137)/(44-103) 121/67 (05/25 0610) SpO2:  [81 %-100 %] 99 % (05/25 0403) FiO2 (%):  [30 %] 30 % (05/25 0403) Weight:  [58.6 kg] 58.6 kg (05/25 0500)  Weight change: 2.7 kg Filed Weights   02/19/20 0500 02/19/20 0701 02/20/20 0500  Weight: 62 kg 64.7 kg 58.6 kg    Intake/Output: I/O last 3 completed shifts: In: 1740 [Other:90; NG/GT:1650] Out: 2275 [Other:2000; Stool:275]   Intake/Output this shift:  No intake/output data recorded.  Genelderly AAM, awake , responsive No jvd or bruits Chestclear to A/P ant and lat RRRbounding precordium, no sig MRG Abd soft ntnd no mass or ascites +bs, +LLQ ostomy, +PEG tube GU normal male MS no joint effusions or deformity Ext1+ dependent edema Feet in boots Neuro more responsive today , more alert  [facility]   Basic  Metabolic Panel: Recent Labs  Lab 02/14/20 0556 02/14/20 1433 02/15/20 0501 02/15/20 0501 02/15/20 2003 02/16/20 0449 02/16/20 0449 02/17/20 0343 02/17/20 0343 02/18/20 0415 02/19/20 0557 02/20/20 0601  NA 138   < > 140   < >  --  138  --  138  --  140 143 140  K 3.5   < > 3.7   < >  --  4.5  --  3.7  --  4.0 5.0 4.9  CL 97*   < > 101   < >  --  101  --  98  --  101 100 100  CO2 25   < > 27   < >  --  23  --  26  --  _0 GLUCOSE 135*   < > 102*   < >  --  86  --  164*  --  156* 154* 199*  BUN 121*   < > 78*   < >  --  84*  --  40*  --  62* 86* 64*  CREATININE 2.50*   < > 2.09*   < >  --  3.00*  --  1.97*  --  2.72* 3.75* 2.98*  CALCIUM 10.8*   < > 10.1   < >  --  9.6   < > 9.2   < > 9.2 9.9 9.6  MG  --   --  1.8  --   --  2.3  --  2.0  --   --   --   --   PHOS 2.3*  --  1.4*  --  4.6 5.8*  --  3.6  --   --   --   --    < > = values in this interval not displayed.    Liver Function Tests: Recent Labs  Lab 02/14/20 0556 02/14/20 1433  AST  --  29  ALT  --  24  ALKPHOS  --  116  BILITOT  --  1.1  PROT  --  8.3*  ALBUMIN 2.8* 3.4*   No results for input(s): LIPASE, AMYLASE in the last 168 hours. No results for input(s): AMMONIA in the last 168 hours.  CBC: Recent Labs  Lab 02/14/20 1433 02/15/20 0501 02/16/20 0449 02/17/20 0343 02/18/20 0415 02/19/20 0557 02/20/20 0601  WBC 16.1*   < > 15.1* 17.0* 15.5* 17.4* 19.5*  NEUTROABS 12.2*  --   --   --   --   --   --   HGB 7.7*   < > 9.4* 9.9* 9.8* 9.6* 10.2*  HCT 24.7*   < > 29.4* 31.0* 30.4* 30.2* 32.0*  MCV 85.8   < > 84.7 84.2 84.7 84.1 85.8  PLT 128*   < > 133* 185 175 208 186   < > = values in this interval not displayed.    Cardiac Enzymes: No results for input(s): CKTOTAL, CKMB, CKMBINDEX, TROPONINI in the last 168 hours.  BNP: Invalid input(s): POCBNP  CBG: Recent Labs  Lab 02/19/20 1506 02/19/20 1939 02/19/20 2324 02/20/20 0354 02/20/20 0712  GLUCAP 167* 155* 137* 152* 180*     Microbiology: Results for orders placed or performed during the hospital encounter of 02/14/20  Culture, blood (routine x 2)     Status: None   Collection Time: 02/14/20  2:55 PM   Specimen: BLOOD RIGHT WRIST  Result Value Ref Range Status   Specimen Description BLOOD RIGHT WRIST  Final   Special Requests   Final    BOTTLES DRAWN AEROBIC AND ANAEROBIC Blood Culture results may not be optimal due to an excessive volume of blood received in culture bottles   Culture   Final    NO GROWTH 5 DAYS Performed at [facility], [place] [address] [place], [place] [address]    Report Status 02/19/2020 FINAL  Final  SARS Coronavirus 2 by RT PCR (hospital order, performed in [facility] hospital lab) Nasopharyngeal Nasopharyngeal Swab     Status: None   Collection Time: 02/14/20  3:00 PM   Specimen: Nasopharyngeal Swab  Result Value Ref Range Status   SARS Coronavirus 2 NEGATIVE NEGATIVE Final    Comment: (NOTE) SARS-CoV-2 target nucleic acids are NOT DETECTED. The SARS-CoV-2 RNA is generally detectable in upper and lower respiratory specimens during the acute phase of infection. The lowest concentration of SARS-CoV-2 viral copies this assay can detect is 250 copies / mL. A negative result does not preclude SARS-CoV-2 infection and should not be used as the sole basis for treatment or other patient management decisions.  A negative result may occur with improper specimen collection / handling, submission of specimen other than nasopharyngeal swab, presence of viral mutation(s) within the areas targeted by this assay, and inadequate number of viral copies (<250 copies / mL). A negative result must be combined with clinical observations, patient history, and epidemiological information. Fact Sheet for Patients:   StrictlyIdeas.no Fact Sheet for Healthcare Providers: BankingDealers.co.za This test is not yet approved or cleared  by the [place] FDA and has been authorized for detection and/or diagnosis of  SARS-CoV-2 by FDA under an Emergency Use Authorization (EUA).  This EUA will remain in effect (meaning this test can be used) for the duration of the COVID-19 declaration under Section 564(b)(1) of the Act, 21 U.S.C. section 360bbb-3(b)(1), unless the authorization is terminated or revoked sooner. Performed at [facility], [place] [address] [place], [place] [address]   Culture, bal-quantitative     Status: None   Collection Time: 02/14/20  5:17 PM   Specimen: Bronchoalveolar Lavage; Respiratory  Result Value Ref Range Status   Specimen Description BRONCHIAL ALVEOLAR LAVAGE  Final   Special Requests Normal  Final   Gram Stain   Final    RARE WBC PRESENT,BOTH PMN AND MONONUCLEAR NO ORGANISMS SEEN    Culture   Final    NO GROWTH 2 DAYS Performed at [facility], [address], [place], [place] [address]    Report Status 02/17/2020 FINAL  Final  Culture, blood (routine x 2)     Status: None   Collection Time: 02/14/20  7:20 PM   Specimen: BLOOD  Result Value Ref Range Status   Specimen Description BLOOD RIGHT ANTECUBITAL  Final   Special Requests   Final    BOTTLES DRAWN AEROBIC AND ANAEROBIC Blood Culture adequate volume   Culture   Final    NO GROWTH 5 DAYS Performed at [facility], [place] [address], [place], [place] [address]    Report Status 02/19/2020 FINAL  Final  MRSA PCR Screening     Status: None   Collection Time: 02/16/20  7:30 PM   Specimen: Nasal Mucosa; Nasopharyngeal  Result Value Ref Range Status   MRSA by PCR NEGATIVE NEGATIVE Final    Comment:        The GeneXpert MRSA Assay (FDA approved for NASAL specimens only), is one component of a comprehensive MRSA colonization surveillance program. It is not intended to diagnose MRSA infection nor to guide or monitor treatment for MRSA infections. Performed at [facility], [place] [address] [place], [place] [address]    Culture, blood (Routine X 2) w Reflex to ID Panel     Status: None (Preliminary result)   Collection Time: 02/17/20  6:26 AM   Specimen: BLOOD RIGHT WRIST  Result Value Ref Range Status   Specimen Description BLOOD RIGHT WRIST  Final   Special Requests AEROBIC BOTTLE ONLY Blood Culture adequate volume  Final   Culture  Setup Time   Final    GRAM POSITIVE COCCI IN CLUSTERS AEROBIC BOTTLE ONLY CRITICAL RESULT CALLED TO, READ BACK BY AND VERIFIED WITH: Annett Gula 1655 02/19/2020 Mena Goes Performed at [facility], [place] [address] [place], [place] [address]    Culture GRAM POSITIVE COCCI  Final   Report Status PENDING  Incomplete  Culture, blood (Routine X 2) w Reflex to ID Panel     Status: None (Preliminary result)   Collection Time: 02/17/20  6:26 AM   Specimen: BLOOD RIGHT HAND  Result Value Ref Range Status   Specimen Description BLOOD RIGHT HAND  Final   Special Requests AEROBIC BOTTLE ONLY Blood Culture adequate volume  Final   Culture   Final    NO GROWTH 2 DAYS Performed at [facility], [place] [address] [place], [place] [address]    Report Status PENDING  Incomplete  Blood Culture ID Panel (Reflexed)     Status: Abnormal   Collection Time: 02/17/20  6:26 AM  Result Value Ref  Range Status   Enterococcus species NOT DETECTED NOT DETECTED Final   Listeria monocytogenes NOT DETECTED NOT DETECTED Final   Staphylococcus species DETECTED (A) NOT DETECTED Final    Comment: Methicillin (oxacillin) resistant coagulase negative staphylococcus. Possible blood culture contaminant (unless isolated from more than one blood culture draw or clinical case suggests pathogenicity). No antibiotic treatment is indicated for blood  culture contaminants. CRITICAL RESULT CALLED TO, READ BACK BY AND VERIFIED WITH: K. HURTH,PHARMD [id number] 02/19/2020 T. TYSOR    Staphylococcus aureus (BCID) NOT DETECTED NOT DETECTED Final   Methicillin resistance DETECTED (A) NOT DETECTED Final     Comment: CRITICAL RESULT CALLED TO, READ BACK BY AND VERIFIED WITH: K. HURTH,PHARMD [id number] 02/19/2020 T. TYSOR    Streptococcus species NOT DETECTED NOT DETECTED Final   Streptococcus agalactiae NOT DETECTED NOT DETECTED Final   Streptococcus pneumoniae NOT DETECTED NOT DETECTED Final   Streptococcus pyogenes NOT DETECTED NOT DETECTED Final   Acinetobacter baumannii NOT DETECTED NOT DETECTED Final   Enterobacteriaceae species NOT DETECTED NOT DETECTED Final   Enterobacter cloacae complex NOT DETECTED NOT DETECTED Final   Escherichia coli NOT DETECTED NOT DETECTED Final   Klebsiella oxytoca NOT DETECTED NOT DETECTED Final   Klebsiella pneumoniae NOT DETECTED NOT DETECTED Final   Proteus species NOT DETECTED NOT DETECTED Final   Serratia marcescens NOT DETECTED NOT DETECTED Final   Haemophilus influenzae NOT DETECTED NOT DETECTED Final   Neisseria meningitidis NOT DETECTED NOT DETECTED Final   Pseudomonas aeruginosa NOT DETECTED NOT DETECTED Final   Candida albicans NOT DETECTED NOT DETECTED Final   Candida glabrata NOT DETECTED NOT DETECTED Final   Candida krusei NOT DETECTED NOT DETECTED Final   Candida parapsilosis NOT DETECTED NOT DETECTED Final   Candida tropicalis NOT DETECTED NOT DETECTED Final    Comment: Performed at [facility], [place]. [address] [place], [place] [address]  Culture, respiratory (non-expectorated)     Status: None (Preliminary result)   Collection Time: 02/17/20  7:30 AM   Specimen: Tracheal Aspirate; Respiratory  Result Value Ref Range Status   Specimen Description TRACHEAL ASPIRATE  Final   Special Requests Normal  Final   Gram Stain   Final    RARE WBC PRESENT, PREDOMINANTLY PMN NO ORGANISMS SEEN    Culture   Final    RARE YEAST IDENTIFICATION TO FOLLOW Performed at [facility], [address], [place],  [address]    Report Status PENDING  Incomplete    Coagulation Studies: No results for input(s): LABPROT, INR in the last 72  hours.  Urinalysis: No results for input(s): COLORURINE, LABSPEC, PHURINE, GLUCOSEU, HGBUR, BILIRUBINUR, KETONESUR, PROTEINUR, UROBILINOGEN, NITRITE, LEUKOCYTESUR in the last 72 hours.  Invalid input(s): APPERANCEUR    Imaging: DG CHEST PORT 1 VIEW  Result Date: 02/19/2020 CLINICAL DATA:  Hypoxia and tachycardia. Ventilator patient. EXAM: PORTABLE CHEST 1 VIEW COMPARISON:  Radiographs 02/16/2020 and 02/15/2020. Radiographs 12/21/2019. FINDINGS: 1543 hours. Tracheostomy and right IJ hemodialysis catheters appear unchanged. The heart size and mediastinal contours are stable with aortic atherosclerosis. Interval improvement in the previously demonstrated left lower lobe consolidation. Patchy airspace opacities in both lungs are otherwise stable. No pneumothorax or significant pleural effusion. The bones appear unchanged. IMPRESSION: 1. Interval improvement in left lower lobe infiltrate. The patchy bilateral airspace opacities are otherwise unchanged, likely related to the patient's viral pneumonia. 2. Stable support system. 3. No acute findings identified. Electronically Signed   By: Richardean Sale M.D.   On: 02/19/2020 16:04  Medications:   . sodium chloride Stopped (02/16/20 1718)  . feeding supplement (VITAL 1.5 CAL) 1,000 mL (02/19/20 2137)   . chlorhexidine gluconate (MEDLINE KIT)  15 mL Mouth Rinse BID  . Chlorhexidine Gluconate Cloth  6 each Topical Daily  . Chlorhexidine Gluconate Cloth  6 each Topical Q0600  . Chlorhexidine Gluconate Cloth  6 each Topical Q0600  . diltiazem  30 mg Oral Q8H  . docusate  100 mg Per Tube BID  . feeding supplement (PRO-STAT SUGAR FREE 64)  30 mL Per Tube BID  . heparin injection (subcutaneous)  5,000 Units Subcutaneous Q8H  . insulin aspart  0-9 Units Subcutaneous Q4H  . mouth rinse  15 mL Mouth Rinse 10 times per day  . pantoprazole (PROTONIX) IV  40 mg Intravenous Q24H  . polyethylene glycol  17 g Per Tube Daily   sodium chloride,  acetaminophen (TYLENOL) oral liquid 160 mg/5 mL, fentaNYL (SUBLIMAZE) injection, fentaNYL (SUBLIMAZE) injection  Assessment/ Plan:  1. Hemoptysis - in pt w/ trach at [facility], admitted here for bleeding from trach site on 5/19. Seems to be resolved.  Per CCM.  2. Acute/chronic resp failure - sp COVID pna earlier this year, also course at [facility] complicated by multiple infections/ PNA. Sp trach.  3. 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 Getting HD there MWF schedule, RIJ TDC.  Next dialysis treatment planned for 02/20/2020 4. BP/ volume excess -2 L removed 02/19/2020 5. AMS -mental status close to baseline 6. Atrial fib - per primary 7. Anemia ckd / abl - Hb 6.7 > 2u prbcs > 9.4 8. Nutrition - on PEG feeds, +diverting colostomy 9. Sacral decub - per primary, WOC 10. SP COVID +pna - earlier this year  74. Dispo - stable for dc from renal standpoint    LOS: [place] _0 _1 :18 AM

## 2020-02-20 NOTE — Care Plan (Signed)
Sister [name] updated via phone.  Julian Hy, DO 02/20/20 3:41 PM [facility]

## 2020-02-20 NOTE — Progress Notes (Signed)
NAME:  Ester Mabe, MRN:  [id number], DOB:  Jun 17, 1946, LOS: 6 ADMISSION DATE:  02/14/2020, CONSULTATION DATE:  5/19 REFERRING MD:  Dr. Alvino Chapel, CHIEF COMPLAINT:  Hemoptysis   Brief History   74 year old male. COVID ARDS in January. Unfortunately required trach and LTACH placement. Had been tolerating trach collar, but on 5/19 he developed hemoptysis and hypoxemic respiratory failure requiring vent.   History of present illness   74 year old male with past medical history as below, which is significant for Atrial fibrillation and stroke. He was admitted to [facility] for [place] 19 pneumonia, which unfortunately progressed to ARDS.  It seems as though his highest vent settings were 60% FiO2 and 8 of PEEP.  He had very little improvement in his ventilatory and oxygenation use over the course of his hospitalization and ultimately underwent tracheostomy and was [facility] [place] on February 23. His course at [facility] was complicated by ongoing infections including UTI, MDRO pseudomonas pneumonia, and stenotrophomonas bacteremia. As a result of sepsis and shock he developed renal failure requiring dialysis, which he now receives M/W/F. In the morning hours oh 5/19 he developed hemoptysis. He had previously been taken off anticoagulation (for AF) due to anemia with positive hemoccult. Transferred to [facility] ED for evaluation of hemoptysis. Prior to the transfer he had been tolerating 28% ATC.   Past Medical History   has a past medical history of Acute on chronic respiratory failure with hypoxia ([place]), Atrial fibrillation with RVR ([place]), BPH (benign prostatic hyperplasia), COVID-19 virus infection, Pneumonia due to COVID-19 virus, Severe sepsis ([place]), and Stroke ([place]).   Significant Hospital Events   1/8  Admit to [facility] for COVID PNA 2/23 Transfer to [facility] on vent 5/19 Transfer to [facility] for hemoptysis. #6 cuffed trach placed. Back on  vent. Bronch performed 5/20 Transitioned to trach collar.   Consults:  ENT nephro  Procedures:  ENT flex scope 5/19 > no bleeding source identified FOB 5/19 > no pulmonary bleeding source identified.   Significant Diagnostic Tests:  CXR 5/20 >> patchy bilateral infiltrates on left.   Micro Data:  BAL 5/19 > no growth 5/19 blood > NG 5/22 trach aspirate>> candida parasipolis 5/22 blood>> 1/4 GPC  Antimicrobials:  Levofloxacin 5/24> vanc 5/24>  Interim history/subjective:  Febrile overnight, restarted on levofloxacin. He denies complaints this morning.   Objective   Blood pressure 121/67, pulse 77, temperature 99.6 F (37.6 C), temperature source Axillary, resp. rate (!) 21, height _0  (1.753 m), weight 58.6 kg, SpO2 98 %.    Vent Mode: PRVC FiO2 (%):  [30 %] 30 % Set Rate:  [16 bmp] 16 bmp Vt Set:  [480 mL] 480 mL PEEP:  [5 cmH20] 5 cmH20 Plateau Pressure:  [24 cmH20-36 cmH20] 24 cmH20   Intake/Output Summary (Last 24 hours) at 02/20/2020 1227 Last data filed at 02/20/2020 1000 Gross per 24 hour  Intake 1415 ml  Output 425 ml  Net 990 ml   Filed Weights   02/19/20 0500 02/19/20 0701 02/20/20 0500  Weight: 62 kg 64.7 kg 58.6 kg    Examination: Gen:      Cachectic, frail appearing elderly man laying in bed in NAD HEENT:  [place]/AT, eyes anicteric Neck:     Trach in place, no bleeding or secretions around site Lungs:    Tachypneic, breathing above the vent, decreased basilar breath sounds, no rhonchi. CV:  Tachycardic, regular rhythm.  Abd:      Soft, NT, ND. Ostomy in LLQ- pink. PEG without erythema. Ext:    No c/c/e Skin:      Warm, dry, no rashes. Neuro:     Awake and alert, watching TV, globaly weak  CXR 5/24 personally reviewed:   Resolved Hospital Problem list     Assessment & Plan:   Acute on chronic hypoxemic respiratory failure requiring tracheostomy, prolonged MV: he has a trach s/p COVID pneumonia earlier this year. His oxygenation had   improved to tolerate 28% ATC, but has been back on the vent for several days. Course since complicated by multiple pneumonias, which is seems he has complete tx for (MDRO pseudomonas treated with inhaled tobramycin). Now he has had hemoptysis and aspirated blood. Decompensated in ER in this setting. Hemoptysis seems to be originating from stoma. Ulceration?  Has a cuffed trach placed by ENT.  No active bleeding from the blood on bronchoscopy New LUL pneumonia -Con't full vent support. Intolerant of vent weaning currently due to pneumonia. -reculture given fevers overnight -restart vanc, con't levofloxacin started overnight -trach aspirate   Chronic anemia - 2/2 ESRD, critical illness. S/p pRBC transfusion. -con't to monitor -tranfuse for Hb <7 or hemodynamically significant bleeding  ESRD on HD:  -Dialysis per nephrology; most recently dialyzed 5/24 -renally dose meds -strict I/os  Paroxysmal atrial fibrillation: sinus on telemetry today. Amiodarone stopped at [facility] due to bradycardia, anticoagulation stopped due to suspected GIB with several transfusions.  -con't tele monitoring -restart low-dose eliquis as he has been tolerating DVT prophylaxis  Chronic leukocytosis- worsened; likely contaminant in blood cx -con't to monitor cultures -repeat blood cultures given fever  Cachexia, severe protein energy malnutrition -con't TF via PEG  Sacral decub -WOC consult- appreciate their input   Best practice:  Diet: TF Pain/Anxiety/Delirium protocol (if indicated): PRN sedation for vent complaince VAP protocol (if indicated): Per protocol DVT prophylaxis: eliquis low- dose GI prophylaxis: PPI Glucose control: SSI Mobility: BR Code Status: DNR Family Communication: Sister updated 5/21 Disposition: ICU   This patient is critically ill with multiple organ system failure which requires frequent high complexity decision making, assessment, support, evaluation, and titration of therapies.  This was completed through the application of advanced monitoring technologies and extensive interpretation of multiple databases. During this encounter critical care time was devoted to patient care services described in this note for 40 minutes.   Julian Hy, DO 02/20/20 3:38 PM [facility]

## 2020-02-20 NOTE — Progress Notes (Signed)
Pharmacy Antibiotic Note  Shane Banks is a 74 y.o. male admitted on 02/14/2020 with pneumonia HD patient   Plan: Levoflox 500 q48h Vanc 1250 mg x 1 then 500 qhd Monitor renal fx cx lot lvls prn  Height: 5\' 9"  (175.3 cm) Weight: 58.6 kg (129 lb 3 oz) IBW/kg (Calculated) : 70.7  Temp (24hrs), Avg:100 F (37.8 C), Min:98.8 F (37.1 C), Max:102.1 F (38.9 C)  Recent Labs  Lab 02/14/20 1457 02/15/20 0501 02/16/20 0449 02/17/20 0343 02/18/20 0415 02/19/20 0557 02/20/20 0601  WBC  --    < > 15.1* 17.0* 15.5* 17.4* 19.5*  CREATININE  --    < > 3.00* 1.97* 2.72* 3.75* 2.98*  LATICACIDVEN 1.2  --   --   --   --   --   --    < > = values in this interval not displayed.    Estimated Creatinine Clearance: 18.6 mL/min (A) (by C-G formula based on SCr of 2.98 mg/dL (H)).    Allergies  Allergen Reactions  . Aspirin Rash  . Cephalexin Other (See Comments)    dizziness  . Penicillins     Itching,swelling    [name], PharmD, BCPS, BCCCP Clinical Pharmacist [phone]  Please check AMION for all [facility] numbers  02/20/2020 12:54 PM

## 2020-02-20 NOTE — Progress Notes (Addendum)
CSW spoke with Santiago Glad at [facility] who states the facility is not in network with [employer].  CSW spoke with Prem at [facility] who states only the LTAC side can accommodate the patient's dialysis needs.  CSW spoke with Gae Bon at [facility] who states the facility is not in network with [employer].  CSW spoke with Tammy at [facility] in [place] who agreed to review this patient's referral.  Madilyn Fireman, MSW, LCSW-A Transitions of Care  Clinical Social Worker  [facility] Emergency Departments  Medical ICU [phone]

## 2020-02-21 DIAGNOSIS — R64 Cachexia: Secondary | ICD-10-CM

## 2020-02-21 DIAGNOSIS — R7881 Bacteremia: Secondary | ICD-10-CM

## 2020-02-21 LAB — CBC
HCT: 29.5 % — ABNORMAL LOW (ref 39.0–52.0)
Hemoglobin: 9.4 g/dL — ABNORMAL LOW (ref 13.0–17.0)
MCH: 26.4 pg (ref 26.0–34.0)
MCHC: 31.9 g/dL (ref 30.0–36.0)
MCV: 82.9 fL (ref 80.0–100.0)
Platelets: 185 10*3/uL (ref 150–400)
RBC: 3.56 MIL/uL — ABNORMAL LOW (ref 4.22–5.81)
RDW: 17.2 % — ABNORMAL HIGH (ref 11.5–15.5)
WBC: 18.9 10*3/uL — ABNORMAL HIGH (ref 4.0–10.5)
nRBC: 0 % (ref 0.0–0.2)

## 2020-02-21 LAB — BLOOD CULTURE ID PANEL (REFLEXED)
Acinetobacter baumannii: NOT DETECTED
Candida albicans: NOT DETECTED
Candida glabrata: NOT DETECTED
Candida krusei: NOT DETECTED
Candida parapsilosis: NOT DETECTED
Candida tropicalis: NOT DETECTED
Enterobacter cloacae complex: NOT DETECTED
Enterobacteriaceae species: NOT DETECTED
Enterococcus species: DETECTED — AB
Escherichia coli: NOT DETECTED
Haemophilus influenzae: NOT DETECTED
Klebsiella oxytoca: NOT DETECTED
Klebsiella pneumoniae: NOT DETECTED
Listeria monocytogenes: NOT DETECTED
Methicillin resistance: DETECTED — AB
Neisseria meningitidis: NOT DETECTED
Proteus species: NOT DETECTED
Pseudomonas aeruginosa: NOT DETECTED
Serratia marcescens: NOT DETECTED
Staphylococcus aureus (BCID): NOT DETECTED
Staphylococcus species: DETECTED — AB
Streptococcus agalactiae: NOT DETECTED
Streptococcus pneumoniae: NOT DETECTED
Streptococcus pyogenes: NOT DETECTED
Streptococcus species: NOT DETECTED
Vancomycin resistance: NOT DETECTED

## 2020-02-21 LAB — BASIC METABOLIC PANEL
Anion gap: 18 — ABNORMAL HIGH (ref 5–15)
BUN: 96 mg/dL — ABNORMAL HIGH (ref 8–23)
CO2: 23 mmol/L (ref 22–32)
Calcium: 9.9 mg/dL (ref 8.9–10.3)
Chloride: 100 mmol/L (ref 98–111)
Creatinine, Ser: 3.8 mg/dL — ABNORMAL HIGH (ref 0.61–1.24)
GFR calc Af Amer: 17 mL/min — ABNORMAL LOW (ref >60)
GFR calc non Af Amer: 15 mL/min — ABNORMAL LOW (ref >60)
Glucose, Bld: 189 mg/dL — ABNORMAL HIGH (ref 70–99)
Potassium: 5.6 mmol/L — ABNORMAL HIGH (ref 3.5–5.1)
Sodium: 141 mmol/L (ref 135–145)

## 2020-02-21 LAB — GLUCOSE, CAPILLARY
Glucose-Capillary: 136 mg/dL — ABNORMAL HIGH (ref 70–99)
Glucose-Capillary: 149 mg/dL — ABNORMAL HIGH (ref 70–99)
Glucose-Capillary: 133 mg/dL — ABNORMAL HIGH (ref 70–99)
Glucose-Capillary: 184 mg/dL — ABNORMAL HIGH (ref 70–99)
Glucose-Capillary: 149 mg/dL — ABNORMAL HIGH (ref 70–99)
Glucose-Capillary: 186 mg/dL — ABNORMAL HIGH (ref 70–99)

## 2020-02-21 MED ORDER — HEPARIN SODIUM (PORCINE) 1000 UNIT/ML IJ SOLN
INTRAMUSCULAR | Status: AC
  Filled 2020-02-21: qty 4

## 2020-02-21 MED ORDER — VANCOMYCIN VARIABLE DOSE PER UNSTABLE RENAL FUNCTION (PHARMACIST DOSING): Status: DC

## 2020-02-21 MED ORDER — VANCOMYCIN HCL IN DEXTROSE 500-5 MG/100ML-% IV SOLN
INTRAVENOUS | Status: AC
  Filled 2020-02-21: qty 100

## 2020-02-21 MED ORDER — DILTIAZEM HCL 30 MG PO TABS
30.0000 mg | ORAL_TABLET | Freq: Three times a day (TID) | ORAL | Status: DC
  Administered 2020-02-21 – 2020-02-29 (×21): 30 mg
  Filled 2020-02-21 (×24): qty 1

## 2020-02-21 MED ORDER — NEPRO/CARBSTEADY PO LIQD
1000.0000 mL | ORAL | Status: DC
  Administered 2020-02-22 – 2020-03-04 (×11): 1000 mL
  Filled 2020-02-21 (×17): qty 1000

## 2020-02-21 MED ORDER — VANCOMYCIN HCL 500 MG/100ML IV SOLN
500.0000 mg | Freq: Once | INTRAVENOUS | Status: AC
  Administered 2020-02-21: 500 mg via INTRAVENOUS
  Filled 2020-02-21: qty 100

## 2020-02-21 NOTE — Care Management (Addendum)
CM again reached out to [facility] to discuss possible readmit.  [facility] liaison explained that pt is no longer appropriate for LTACH level of care as pt met the goals established for the [facility] level of care prior to admitting to [facility].   Per [facility] liaison; pt is back to his new baseline and can be managed in a SNF.  [facility] is also declining to make bed offer for pt

## 2020-02-21 NOTE — Progress Notes (Addendum)
CSW received notification from [place] at [facility] in [place] who states the facility can not accomodates the patient's dialysis needs.  CSW spoke with Abigail Butts in admissions at [facility] in [place], [place] who agreed to review this patient. CSW sent over clinical information for review.  Madilyn Fireman, MSW, LCSW-A Transitions of Care  Clinical Social Worker  [facility] Emergency Departments  Medical ICU [phone]

## 2020-02-21 NOTE — Progress Notes (Addendum)
[name]   Subjective:   This is a 74 year old gentleman with a history of CVA benign plastic hypertrophy atrial fibrillation developed Covid pneumonia admitted to [facility].  Required ventilator support and eventually tracheostomy.  [facility] 11/21/2019.  Developed complications from UTI Pseudomonas pneumonia stenotrophomonas bacteremia.  Received IV vancomycin developed acute kidney injury started on dialysis 12/16/2019.  Has been dialysis dependent since.  Was admitted to [facility] with hemoptysis 02/14/2020.  Receives dialysis Monday Wednesday Friday  Removal 2 L 02/19/2020  Blood pressure 122/68 pulse 101 temperature 101.6 O2 sats 100% 30% FiO2  Sodium 141 potassium 5.6 chloride 100 CO2 23 BUN 96 creatinine 3.8 glucose 189 hemoglobin 9.4 WBC 18.9 platelets 185  Insulin sliding scale, Protonix 40 mg every 24 hours, Eliquis 2.5 mg twice daily, Cardizem 30 mg every 8 hours  IV vancomycin    Objective:  Vital signs in last 24 hours:  Temp:  [99.6 F (37.6 C)-101.6 F (38.7 C)] 101.6 F (38.7 C) (05/26 0348) Pulse Rate:  [75-124] 75 (05/26 0500) Resp:  [21-46] 33 (05/26 0500) BP: (90-151)/(60-89) 122/68 (05/26 0538) SpO2:  [94 %-100 %] 100 % (05/26 0500) FiO2 (%):  [30 %] 30 % (05/26 0400) Weight:  [59.9 kg] 59.9 kg (05/26 0500)  Weight change: -4.8 kg Filed Weights   02/19/20 0701 02/20/20 0500 02/21/20 0500  Weight: 64.7 kg 58.6 kg 59.9 kg    Intake/Output: I/O last 3 completed shifts: In: 2425.1 [Other:150; NG/GT:1925; IV Piggyback:350.1] Out: 460 [Stool:460]   Intake/Output this shift:  No intake/output data recorded.  Genelderly AAM, awake , responsive No jvd or bruits Chestclear to A/P ant and lat RRRbounding precordium, no sig MRG Abd soft ntnd no mass or ascites +bs, +LLQ ostomy, +PEG tube GU normal male MS no joint effusions or deformity Ext1+  dependent edema Feet in boots Neuro more responsive today , more alert  RIJ [facility]   Basic Metabolic Panel: Recent Labs  Lab 02/15/20 0501 02/15/20 0501 02/15/20 2003 02/16/20 0449 02/16/20 0449 02/17/20 0343 02/17/20 0343 02/18/20 0415 02/18/20 0415 02/19/20 0557 02/19/20 0557 02/20/20 0601 02/20/20 1455 02/21/20 0540  NA 140   < >  --  138   < > 138   < > 140  --  143  --  140 139 141  K 3.7   < >  --  4.5   < > 3.7   < > 4.0  --  5.0  --  4.9 6.0* 5.6*  CL 101   < >  --  101   < > 98   < > 101  --  100  --  100 98 100  CO2 27   < >  --  23   < > 26   < > 24  --  25  --  _0 GLUCOSE 102*   < >  --  86   < > 164*   < > 156*  --  154*  --  199* 208* 189*  BUN 78*   < >  --  84*   < > 40*   < > 62*  --  86*  --  64* 76* 96*  CREATININE 2.09*   < >  --  3.00*   < > 1.97*   < > 2.72*  --  3.75*  --  2.98* 3.20* 3.80*  CALCIUM 10.1   < >  --  9.6   < > 9.2   < > 9.2   < > 9.9   < > 9.6 9.8 9.9  MG 1.8  --   --  2.3  --  2.0  --   --   --   --   --   --   --   --   PHOS 1.4*  --  4.6 5.8*  --  3.6  --   --   --   --   --   --  5.0*  --    < > = values in this interval not displayed.    Liver Function Tests: Recent Labs  Lab 02/14/20 1433 02/20/20 1455  AST 29  --   ALT 24  --   ALKPHOS 116  --   BILITOT 1.1  --   PROT 8.3*  --   ALBUMIN 3.4* 2.6*   No results for input(s): LIPASE, AMYLASE in the last 168 hours. No results for input(s): AMMONIA in the last 168 hours.  CBC: Recent Labs  Lab 02/14/20 1433 02/15/20 0501 02/18/20 0415 02/19/20 0557 02/20/20 0601 02/20/20 1455 02/21/20 0252  WBC 16.1*   < > 15.5* 17.4* 19.5* 22.1* 18.9*  NEUTROABS 12.2*  --   --   --   --   --   --   HGB 7.7*   < > 9.8* 9.6* 10.2* 9.8* 9.4*  HCT 24.7*   < > 30.4* 30.2* 32.0* 30.8* 29.5*  MCV 85.8   < > 84.7 84.1 85.8 84.4 82.9  PLT 128*   < > 175 208 186 193 185   < > = values in this interval not displayed.    Cardiac Enzymes: No results for input(s): CKTOTAL, CKMB,  CKMBINDEX, TROPONINI in the last 168 hours.  BNP: Invalid input(s): POCBNP  CBG: Recent Labs  Lab 02/20/20 1109 02/20/20 1536 02/20/20 1951 02/20/20 2352 02/21/20 0344  GLUCAP 171* 197* 168* 163* 136*    Microbiology: Results for orders placed or performed during the hospital encounter of 02/14/20  Culture, blood (routine x 2)     Status: None   Collection Time: 02/14/20  2:55 PM   Specimen: BLOOD RIGHT WRIST  Result Value Ref Range Status   Specimen Description BLOOD RIGHT WRIST  Final   Special Requests   Final    BOTTLES DRAWN AEROBIC AND ANAEROBIC Blood Culture results may not be optimal due to an excessive volume of blood received in culture bottles   Culture   Final    NO GROWTH 5 DAYS Performed at [facility], [place] [address] [place], [place] [address]    Report Status 02/19/2020 FINAL  Final  SARS Coronavirus 2 by RT PCR (hospital order, performed in [facility] hospital lab) Nasopharyngeal Nasopharyngeal Swab     Status: None   Collection Time: 02/14/20  3:00 PM   Specimen: Nasopharyngeal Swab  Result Value Ref Range Status   SARS Coronavirus 2 NEGATIVE NEGATIVE Final    Comment: (NOTE) SARS-CoV-2 target nucleic acids are NOT DETECTED. The SARS-CoV-2 RNA is generally detectable in upper and lower respiratory specimens during the acute phase of infection. The lowest concentration of SARS-CoV-2 viral copies this assay can detect is 250 copies / mL. A negative result does not preclude SARS-CoV-2 infection and should not be used as the sole basis for treatment or other patient management decisions.  A negative result may occur with improper specimen collection / handling, submission of specimen other than nasopharyngeal  swab, presence of viral mutation(s) within the areas targeted by this assay, and inadequate number of viral copies (<250 copies / mL). A negative result must be combined with clinical observations, patient history, and epidemiological  information. Fact Sheet for Patients:   StrictlyIdeas.no Fact Sheet for Healthcare Providers: BankingDealers.co.za This test is not yet approved or cleared  by the [place] FDA and has been authorized for detection and/or diagnosis of SARS-CoV-2 by FDA under an Emergency Use Authorization (EUA).  This EUA will remain in effect (meaning this test can be used) for the duration of the COVID-19 declaration under Section 564(b)(1) of the Act, 21 U.S.C. section 360bbb-3(b)(1), unless the authorization is terminated or revoked sooner. Performed at [facility], [place] [address] [place], [place] [address]   Culture, bal-quantitative     Status: None   Collection Time: 02/14/20  5:17 PM   Specimen: Bronchoalveolar Lavage; Respiratory  Result Value Ref Range Status   Specimen Description BRONCHIAL ALVEOLAR LAVAGE  Final   Special Requests Normal  Final   Gram Stain   Final    RARE WBC PRESENT,BOTH PMN AND MONONUCLEAR NO ORGANISMS SEEN    Culture   Final    NO GROWTH 2 DAYS Performed at [facility], [address], [place], [place] [address]    Report Status 02/17/2020 FINAL  Final  Culture, blood (routine x 2)     Status: None   Collection Time: 02/14/20  7:20 PM   Specimen: BLOOD  Result Value Ref Range Status   Specimen Description BLOOD RIGHT ANTECUBITAL  Final   Special Requests   Final    BOTTLES DRAWN AEROBIC AND ANAEROBIC Blood Culture adequate volume   Culture   Final    NO GROWTH 5 DAYS Performed at [facility], [place] [address], [place], [place] [address]    Report Status 02/19/2020 FINAL  Final  MRSA PCR Screening     Status: None   Collection Time: 02/16/20  7:30 PM   Specimen: Nasal Mucosa; Nasopharyngeal  Result Value Ref Range Status   MRSA by PCR NEGATIVE NEGATIVE Final    Comment:        The GeneXpert MRSA Assay (FDA approved for NASAL specimens only), is one component of a comprehensive MRSA  colonization surveillance program. It is not intended to diagnose MRSA infection nor to guide or monitor treatment for MRSA infections. Performed at [facility], [place] [address] [place], [place] [address]   Culture, blood (Routine X 2) w Reflex to ID Panel     Status: Abnormal (Preliminary result)   Collection Time: 02/17/20  6:26 AM   Specimen: BLOOD RIGHT WRIST  Result Value Ref Range Status   Specimen Description BLOOD RIGHT WRIST  Final   Special Requests AEROBIC BOTTLE ONLY Blood Culture adequate volume  Final   Culture  Setup Time   Final    GRAM POSITIVE COCCI IN CLUSTERS AEROBIC BOTTLE ONLY CRITICAL RESULT CALLED TO, READ BACK BY AND VERIFIED WITH: K. HURTH,PHARMD [id number] 02/19/2020 T. TYSOR    Culture (A)  Final    STAPHYLOCOCCUS SPECIES (COAGULASE NEGATIVE) THE SIGNIFICANCE OF ISOLATING THIS ORGANISM FROM A SINGLE SET OF BLOOD CULTURES WHEN MULTIPLE SETS ARE DRAWN IS UNCERTAIN. PLEASE NOTIFY THE MICROBIOLOGY DEPARTMENT WITHIN ONE WEEK IF SPECIATION AND SENSITIVITIES ARE REQUIRED. Performed at [facility], [place] [address] [place],  [address]    Report Status PENDING  Incomplete  Culture, blood (Routine X 2)  w Reflex to ID Panel     Status: None (Preliminary result)   Collection Time: 02/17/20  6:26 AM   Specimen: BLOOD RIGHT HAND  Result Value Ref Range Status   Specimen Description BLOOD RIGHT HAND  Final   Special Requests AEROBIC BOTTLE ONLY Blood Culture adequate volume  Final   Culture   Final    NO GROWTH 3 DAYS Performed at [facility], [address], [place], [place] [address]    Report Status PENDING  Incomplete  Blood Culture ID Panel (Reflexed)     Status: Abnormal   Collection Time: 02/17/20  6:26 AM  Result Value Ref Range Status   Enterococcus species NOT DETECTED NOT DETECTED Final   Listeria monocytogenes NOT DETECTED NOT DETECTED Final   Staphylococcus species DETECTED (A) NOT DETECTED Final    Comment: Methicillin  (oxacillin) resistant coagulase negative staphylococcus. Possible blood culture contaminant (unless isolated from more than one blood culture draw or clinical case suggests pathogenicity). No antibiotic treatment is indicated for blood  culture contaminants. CRITICAL RESULT CALLED TO, READ BACK BY AND VERIFIED WITH: K. HURTH,PHARMD [id number] 02/19/2020 T. TYSOR    Staphylococcus aureus (BCID) NOT DETECTED NOT DETECTED Final   Methicillin resistance DETECTED (A) NOT DETECTED Final    Comment: CRITICAL RESULT CALLED TO, READ BACK BY AND VERIFIED WITH: K. HURTH,PHARMD [id number] 02/19/2020 T. TYSOR    Streptococcus species NOT DETECTED NOT DETECTED Final   Streptococcus agalactiae NOT DETECTED NOT DETECTED Final   Streptococcus pneumoniae NOT DETECTED NOT DETECTED Final   Streptococcus pyogenes NOT DETECTED NOT DETECTED Final   Acinetobacter baumannii NOT DETECTED NOT DETECTED Final   Enterobacteriaceae species NOT DETECTED NOT DETECTED Final   Enterobacter cloacae complex NOT DETECTED NOT DETECTED Final   Escherichia coli NOT DETECTED NOT DETECTED Final   Klebsiella oxytoca NOT DETECTED NOT DETECTED Final   Klebsiella pneumoniae NOT DETECTED NOT DETECTED Final   Proteus species NOT DETECTED NOT DETECTED Final   Serratia marcescens NOT DETECTED NOT DETECTED Final   Haemophilus influenzae NOT DETECTED NOT DETECTED Final   Neisseria meningitidis NOT DETECTED NOT DETECTED Final   Pseudomonas aeruginosa NOT DETECTED NOT DETECTED Final   Candida albicans NOT DETECTED NOT DETECTED Final   Candida glabrata NOT DETECTED NOT DETECTED Final   Candida krusei NOT DETECTED NOT DETECTED Final   Candida parapsilosis NOT DETECTED NOT DETECTED Final   Candida tropicalis NOT DETECTED NOT DETECTED Final    Comment: Performed at [facility], [place] [address], [place], [place] [address]  Culture, respiratory (non-expectorated)     Status: None   Collection Time: 02/17/20  7:30 AM   Specimen: Tracheal Aspirate;  Respiratory  Result Value Ref Range Status   Specimen Description TRACHEAL ASPIRATE  Final   Special Requests Normal  Final   Gram Stain   Final    RARE WBC PRESENT, PREDOMINANTLY PMN NO ORGANISMS SEEN Performed at [facility], [place] [address], [place],  [address]    Culture RARE CANDIDA PARAPSILOSIS  Final   Report Status 02/20/2020 FINAL  Final  Culture, respiratory (non-expectorated)     Status: None (Preliminary result)   Collection Time: 02/20/20 12:38 PM   Specimen: Tracheal Aspirate; Respiratory  Result Value Ref Range Status   Specimen Description TRACHEAL ASPIRATE  Final   Special Requests NONE  Final   Gram Stain   Final    RARE WBC PRESENT, PREDOMINANTLY PMN RARE SQUAMOUS EPITHELIAL CELLS PRESENT FEW GRAM POSITIVE RODS  RARE GRAM POSITIVE COCCI Performed at [facility], [place] [address], [place], [place] [address]    Culture PENDING  Incomplete   Report Status PENDING  Incomplete  Culture, blood (routine x 2)     Status: None (Preliminary result)   Collection Time: 02/20/20 12:50 PM   Specimen: BLOOD LEFT HAND  Result Value Ref Range Status   Specimen Description BLOOD LEFT HAND  Final   Special Requests   Final    BOTTLES DRAWN AEROBIC AND ANAEROBIC Blood Culture adequate volume   Culture  Setup Time   Final    GRAM POSITIVE COCCI IN CHAINS IN BOTH AEROBIC AND ANAEROBIC BOTTLES Organism ID to follow Performed at [facility], [place] [address] [place], [place] [address]    Culture GRAM POSITIVE COCCI  Final   Report Status PENDING  Incomplete  Culture, blood (routine x 2)     Status: None (Preliminary result)   Collection Time: 02/20/20 12:58 PM   Specimen: BLOOD LEFT ARM  Result Value Ref Range Status   Specimen Description BLOOD LEFT ARM  Final   Special Requests   Final    BOTTLES DRAWN AEROBIC AND ANAEROBIC Blood Culture adequate volume   Culture PENDING  Incomplete   Report Status PENDING  Incomplete    Coagulation Studies: No  results for input(s): LABPROT, INR in the last 72 hours.  Urinalysis: No results for input(s): COLORURINE, LABSPEC, PHURINE, GLUCOSEU, HGBUR, BILIRUBINUR, KETONESUR, PROTEINUR, UROBILINOGEN, NITRITE, LEUKOCYTESUR in the last 72 hours.  Invalid input(s): APPERANCEUR    Imaging: DG CHEST PORT 1 VIEW  Result Date: 02/19/2020 CLINICAL DATA:  Hypoxia and tachycardia. Ventilator patient. EXAM: PORTABLE CHEST 1 VIEW COMPARISON:  Radiographs 02/16/2020 and 02/15/2020. Radiographs 12/21/2019. FINDINGS: 1543 hours. Tracheostomy and right IJ hemodialysis catheters appear unchanged. The heart size and mediastinal contours are stable with aortic atherosclerosis. Interval improvement in the previously demonstrated left lower lobe consolidation. Patchy airspace opacities in both lungs are otherwise stable. No pneumothorax or significant pleural effusion. The bones appear unchanged. IMPRESSION: 1. Interval improvement in left lower lobe infiltrate. The patchy bilateral airspace opacities are otherwise unchanged, likely related to the patient's viral pneumonia. 2. Stable support system. 3. No acute findings identified. Electronically Signed   By: Richardean Sale M.D.   On: 02/19/2020 16:04     Medications:   . sodium chloride    . feeding supplement (VITAL 1.5 CAL) 55 mL/hr at 02/20/20 1800  . levofloxacin (LEVAQUIN) IV Stopped (02/20/20 1257)   . apixaban  2.5 mg Per Tube BID  . chlorhexidine gluconate (MEDLINE KIT)  15 mL Mouth Rinse BID  . Chlorhexidine Gluconate Cloth  6 each Topical Daily  . Chlorhexidine Gluconate Cloth  6 each Topical Q0600  . diltiazem  30 mg Oral Q8H  . docusate  100 mg Per Tube BID  . feeding supplement (PRO-STAT SUGAR FREE 64)  30 mL Per Tube BID  . insulin aspart  0-9 Units Subcutaneous Q4H  . mouth rinse  15 mL Mouth Rinse 10 times per day  . pantoprazole (PROTONIX) IV  40 mg Intravenous Q24H  . polyethylene glycol  17 g Per Tube Daily  . [START ON 02/22/2020] vancomycin   500 mg Intravenous Q T,Th,Sa-HD   sodium chloride, acetaminophen (TYLENOL) oral liquid 160 mg/5 mL, alteplase, fentaNYL (SUBLIMAZE) injection, fentaNYL (SUBLIMAZE) injection, heparin, lidocaine (PF), lidocaine-prilocaine, pentafluoroprop-tetrafluoroeth  Assessment/ Plan:  1. Hemoptysis - in pt w/ trach at [facility], admitted here for bleeding from trach site  on 5/19. Seems to be resolved.   Seems to be ulceration of stoma 2. Acute/chronic resp failure - sp COVID pna earlier this year, also course at [facility] complicated by multiple infections/ PNA. Sp trach. Vancomycin restarted. 02/21/2019 reculturing due to fever 3. 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 Getting HD there MWF schedule, RIJ TDC.  Next dialysis treatment planned for 02/20/2020 4. BP/ volume excess -2 L removed 02/19/2020 5. AMS -mental status close to baseline 6. Atrial fib - per primary Eliquis and Cardizem for rate control 7. Anemia ckd / abl - Hb 6.7 > 2u prbcs > 9.4 8. Nutrition - on PEG feeds, +diverting colostomy 9. Sacral decub - per primary, WOC  Enterococcus growing in blood recommending line holiday.  Dialysis 02/21/2020 remove catheter after dialysis.  Has a right IJ TDC      LOS: [place] _0 _1 :14 AM

## 2020-02-21 NOTE — Progress Notes (Signed)
Nutrition Follow-up  DOCUMENTATION CODES:   Severe malnutrition in context of chronic illness  INTERVENTION:   Continue TF via PEG: Change to Nepro at 45 ml/h (1080 ml per day) Pro-stat 30 ml BID  Provides 2144 kcal, 117 gm protein, 785 ml free water daily  NUTRITION DIAGNOSIS:   Severe Malnutrition related to chronic illness(chronic respiratory failure related to COVID ARDS) as evidenced by severe muscle depletion, severe fat depletion.  GOAL:   Patient will meet greater than or equal to 90% of their needs  MONITOR:   Vent status, Labs, TF tolerance, Skin  REASON FOR ASSESSMENT:   Ventilator, Other (Comment)(hx PEG)    ASSESSMENT:   74 yo male admitted with hemoptysis. 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   Discussed patient in ICU rounds and with RN today. HD catheter being removed today; will be replaced Friday afternoon for HD on Saturday.  Patient is tolerating TF well via PEG. Potassium and phosphorus now elevated, will change to a renal TF formula.  Patient is currently intubated on ventilator support MV: 19.5 L/min Temp (24hrs), Avg:99.3 F (37.4 C), Min:97.5 F (36.4 C), Max:101.6 F (38.7 C)   Labs reviewed. BUN 96 (H), Creat 3.8 (H), K 5.6 (H), phos 5 (H) CBG: [phone]  Medications reviewed and include colace, novolog, miralax.  59.3 kg today, down from 65 kg on admission. I/O + 2.3 L since admission.   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  Estimated Nutritional Needs:   Kcal:  1900-2200  Protein:  100-125 gm  Fluid:  1 L + UOP    Lucas Mallow, RD, LDN, CNSC Please refer to Amion for contact information.

## 2020-02-21 NOTE — Progress Notes (Signed)
NAME:  Javonni Macke, MRN:  [id number], DOB:  1946/03/31, LOS: 7 ADMISSION DATE:  02/14/2020, CONSULTATION DATE:  5/19 REFERRING MD:  Dr. Alvino Chapel, CHIEF COMPLAINT:  Hemoptysis   Brief History   74 year old male. COVID ARDS in January. Unfortunately required trach and LTACH placement. Had been tolerating trach collar, but on 5/19 he developed hemoptysis and hypoxemic respiratory failure requiring vent.   History of present illness   74 year old male with past medical history as below, which is significant for Atrial fibrillation and stroke. He was admitted to [facility] for [place] 19 pneumonia, which unfortunately progressed to ARDS.  It seems as though his highest vent settings were 60% FiO2 and 8 of PEEP.  He had very little improvement in his ventilatory and oxygenation use over the course of his hospitalization and ultimately underwent tracheostomy and was discharged to [facility] specialty LT[facility] in [place] on February 23. His course at [facility] was complicated by ongoing infections including UTI, MDRO pseudomonas pneumonia, and stenotrophomonas bacteremia. As a result of sepsis and shock he developed renal failure requiring dialysis, which he now receives M/W/F. In the morning hours oh 5/19 he developed hemoptysis. He had previously been taken off anticoagulation (for AF) due to anemia with positive hemoccult. Transferred to [facility] ED for evaluation of hemoptysis. Prior to the transfer he had been tolerating 28% ATC.   Past Medical History   has a past medical history of Acute on chronic respiratory failure with hypoxia ([place]), Atrial fibrillation with RVR ([place]), BPH (benign prostatic hyperplasia), COVID-19 virus infection, Pneumonia due to COVID-19 virus, Severe sepsis ([place]), and Stroke ([place]).   Significant Hospital Events   1/8  Admit to [facility] for COVID PNA 2/23 Transfer to [facility] on vent 5/19 Transfer to [facility] for hemoptysis. #6 cuffed trach placed. Back on  vent. Bronch performed 5/20 Transitioned to trach collar.   Consults:  ENT nephro ID  Procedures:  ENT flex scope 5/19 > no bleeding source identified FOB 5/19 > no pulmonary bleeding source identified.   Significant Diagnostic Tests:  CXR 5/20 >> patchy bilateral infiltrates on left.   Micro Data:  BAL 5/19 > no growth 5/19 blood > NG 5/22 trach aspirate>> candida parasipolis 5/22 blood>> 1/4 GPC 5/25 blood>> GPC (enterococcus) 2/4 bottles>>  5/25 trach aspirate>>few GPR, rare GPC  Antimicrobials:  Levofloxacin 5/24>5/26 vanc 5/24>   Interim history/subjective:  No acute complaints. HD this morning.  Objective   Blood pressure (!) 86/63, pulse (!) 113, temperature 98.5 F (36.9 C), temperature source Oral, resp. rate (!) 33, height _0  (1.753 m), weight 59.3 kg, SpO2 100 %.    Vent Mode: PRVC FiO2 (%):  [30 %] 30 % Set Rate:  [16 bmp] 16 bmp Vt Set:  [480 mL] 480 mL PEEP:  [5 cmH20] 5 cmH20 Plateau Pressure:  [30 cmH20] 30 cmH20   Intake/Output Summary (Last 24 hours) at 02/21/2020 1116 Last data filed at 02/21/2020 1100 Gross per 24 hour  Intake 1730.09 ml  Output 510 ml  Net 1220.09 ml   Filed Weights   02/20/20 0500 02/21/20 0500 02/21/20 0900  Weight: 58.6 kg 59.9 kg 59.3 kg    Examination: Gen:      Cachectic frail appearing man laying in bed in NAD. HEENT:  [place]/AT, eyes anicteric Neck:     Trach in place, no bleeding or secretions around stoma. Lungs:    tachypneic breathing above the vent. CV:  Tachycardic, regular rhythm. Abd:      Soft, NT, ND Ext:    No clubbing or cyanosis. Skin:       Warm, dry, no rashes Neuro:     Awake and alert, moving all extremities but very weak.   Resolved Hospital Problem list     Assessment & Plan:   Acute on chronic hypoxemic respiratory failure requiring tracheostomy, prolonged MV: he has a trach s/p COVID pneumonia earlier this year. His oxygenation had  improved to tolerate 28% ATC, but has been  back on the vent for several days. Course since complicated by multiple pneumonias, which is seems he has complete tx for (MDRO pseudomonas treated with inhaled tobramycin). Now he has had hemoptysis and aspirated blood. Decompensated in ER in this setting. Hemoptysis seems to be originating from stoma. Ulceration?  Has a cuffed trach placed by ENT.  No active bleeding from the blood on bronchoscopy New LUL pneumonia -Con't full vent support. Intolerant of vent weaning currently due to high respiratory rate due to pneumonia. At goal. -Follow respiratory cultures -Con't vanc and levofloxacin -VAP prevention protocol.  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 S/p pRBC transfusion. -con't to monitor -tranfuse for Hb <7 or hemodynamically significant bleeding  ESRD on HD  -dialysis per nephro- today and again on Saturday. -renally dose meds -strict I/os -switch TF to nepro for hyperkalemia  Paroxysmal atrial fibrillation: sinus on telemetry today. Amiodarone stopped at [facility] due to bradycardia, anticoagulation stopped due to suspected GIB with several transfusions.  -con't tele monitoring -restart low-dose eliquis as he has been tolerating DVT prophylaxis  Chronic leukocytosis- improving now on antibiotics. Prev culture positive for GPC thought to be contaminant- but wasn't positive for enterococcus on biofire. -con't to monitor cultures -repeat blood cultures tomorrow  Cachexia, severe protein energy malnutrition -con't TF via PEG  Sacral decub -WOC consult- appreciate their input   Best practice:  Diet: TF Pain/Anxiety/Delirium protocol (if indicated): PRN sedation for vent complaince VAP protocol (if indicated): Per protocol DVT prophylaxis: eliquis low- dose GI prophylaxis: PPI Glucose control: SSI Mobility: BR Code Status: DNR  Family Communication: Sister updated 5/26 Disposition: ICU   This patient is critically ill with multiple organ system failure which requires frequent high complexity decision making, assessment, support, evaluation, and titration of therapies. This was completed through the application of advanced monitoring technologies and extensive interpretation of multiple databases. During this encounter critical care time was devoted to patient care services described in this note for 46 minutes.   Julian Hy, DO 02/21/20 11:47 AM [facility]

## 2020-02-21 NOTE — Progress Notes (Signed)
PHARMACY - PHYSICIAN COMMUNICATION CRITICAL VALUE ALERT - BLOOD CULTURE IDENTIFICATION (BCID)  Shane Banks is an 74 y.o. male who presented to [facility] on 02/14/[facility]   Assessment:  Enterococcus on BCID  Name of physician (or Provider) Contacted: Dr Carlis Abbott CCM, Dr Baxter Flattery ID  Current antibiotics: levoflox, vanc  Changes to prescribed antibiotics recommended:  DC levoflox vanc 500 mg x 1 today with HD  F/U replacement of line later this week  Results for orders placed or performed during the hospital encounter of 02/14/20  Blood Culture ID Panel (Reflexed) (Collected: 02/20/2020 12:50 PM)  Result Value Ref Range   Enterococcus species DETECTED (A) NOT DETECTED   Vancomycin resistance NOT DETECTED NOT DETECTED   Listeria monocytogenes NOT DETECTED NOT DETECTED   Staphylococcus species DETECTED (A) NOT DETECTED   Staphylococcus aureus (BCID) NOT DETECTED NOT DETECTED   Methicillin resistance DETECTED (A) NOT DETECTED   Streptococcus species NOT DETECTED NOT DETECTED   Streptococcus agalactiae NOT DETECTED NOT DETECTED   Streptococcus pneumoniae NOT DETECTED NOT DETECTED   Streptococcus pyogenes NOT DETECTED NOT DETECTED   Acinetobacter baumannii NOT DETECTED NOT DETECTED   Enterobacteriaceae species NOT DETECTED NOT DETECTED   Enterobacter cloacae complex NOT DETECTED NOT DETECTED   Escherichia coli NOT DETECTED NOT DETECTED   Klebsiella oxytoca NOT DETECTED NOT DETECTED   Klebsiella pneumoniae NOT DETECTED NOT DETECTED   Proteus species NOT DETECTED NOT DETECTED   Serratia marcescens NOT DETECTED NOT DETECTED   Haemophilus influenzae NOT DETECTED NOT DETECTED   Neisseria meningitidis NOT DETECTED NOT DETECTED   Pseudomonas aeruginosa NOT DETECTED NOT DETECTED   Candida albicans NOT DETECTED NOT DETECTED   Candida glabrata NOT DETECTED NOT DETECTED   Candida krusei NOT DETECTED NOT DETECTED   Candida parapsilosis NOT DETECTED NOT DETECTED   Candida tropicalis NOT DETECTED NOT  DETECTED   [name], PharmD, BCPS, BCCCP Clinical Pharmacist [phone]  Please check AMION for all [place] numbers  02/21/2020 10:41 AM

## 2020-02-21 NOTE — Consult Note (Signed)
[facility] for Infectious Disease  Total days of antibiotics 2         Reason for Consult: enterococcal bacteremia    Referring Physician: mikhail Active Problems:   Hemoptysis   Pressure injury of skin   Protein-calorie malnutrition, severe    HPI: Shane Banks is a 74 y.o. male with respiratory failure s/p trach, MDRO PsA colonized, ESRD on HD all secondary to severe covid disease, s/p peg, LTACH resident, who was admitted on 5/19 for bleading around his trach and soma with evolving hemoptysis plus hypoxia. His CXR was thought to be unchanged with patchy infiltrates. He underwent bronchoscopy for which excluded DAH, no endobronchial lesions and no focal blood loss in the airway. Since admit, he has had intermittent fevers, for which he had blood cx + for enterococcus on HD#6. Due to difficulty with decreasing vent setting and cxr, concern for pneumonia, thus vancomycin and levofloxacin.   Past Medical History:  Diagnosis Date  . Acute on chronic respiratory failure with hypoxia ([place])   . Atrial fibrillation with RVR ([place])   . BPH (benign prostatic hyperplasia)   . COVID-19 virus infection   . Pneumonia due to COVID-19 virus   . Severe sepsis ([place])   . Stroke [facility])     Allergies:  Allergies  Allergen Reactions  . Aspirin Rash  . Cephalexin Other (See Comments)    dizziness  . Penicillins     Itching,swelling     MEDICATIONS: . apixaban  2.5 mg Per Tube BID  . chlorhexidine gluconate (MEDLINE KIT)  15 mL Mouth Rinse BID  . Chlorhexidine Gluconate Cloth  6 each Topical Daily  . Chlorhexidine Gluconate Cloth  6 each Topical Q0600  . diltiazem  30 mg Per Tube Q8H  . docusate  100 mg Per Tube BID  . feeding supplement (PRO-STAT SUGAR FREE 64)  30 mL Per Tube BID  . insulin aspart  0-9 Units Subcutaneous Q4H  . mouth rinse  15 mL Mouth Rinse 10 times per day  . pantoprazole (PROTONIX) IV  40 mg Intravenous Q24H  . polyethylene glycol  17 g Per Tube Daily  .  vancomycin variable dose per unstable renal function (pharmacist dosing)   Does not apply See admin instructions    Social History   Tobacco Use  . Smoking status: Not on file  Substance Use Topics  . Alcohol use: Not on file  . Drug use: Not on file    No family history on file.  Review of Systems - unable to obtain since the patient since on trach sedated   OBJECTIVE: Temp:  [97.5 F (36.4 C)-101.6 F (38.7 C)] 98.7 F (37.1 C) (05/26 1532) Pulse Rate:  [52-115] 80 (05/26 1600) Resp:  [23-46] 35 (05/26 1600) BP: (80-151)/(48-88) 97/59 (05/26 1600) SpO2:  [93 %-100 %] 100 % (05/26 1600) FiO2 (%):  [30 %] 30 % (05/26 1555) Weight:  [59.3 kg-59.9 kg] 59.3 kg (05/26 0900) Physical Exam  Constitutional: He opens eyes to verbal stimuli. Doesn't respond answers. He appears cachetic and chronically ill. No distress.  HENT:  Mouth/Throat: Oropharynx is clear and moist. No oropharyngeal exudate.  Cardiovascular: Normal rate, regular rhythm and normal heart sounds. Exam reveals no gallop and no friction rub.  No murmur heard.  Chest wall = HD catheter in place no surrounding erythema Abd= peg and ostomy present Skin: Skin is warm and dry. No rash noted. No erythema.  Psychiatric: He has a normal mood and affect. His  behavior is normal.     LABS: Results for orders placed or performed during the hospital encounter of 02/14/20 (from the past 48 hour(s))  Glucose, capillary     Status: Abnormal   Collection Time: 02/19/20  7:39 PM  Result Value Ref Range   Glucose-Capillary 155 (H) 70 - 99 mg/dL    Comment: Glucose reference range applies only to samples taken after fasting for at least 8 hours.  Glucose, capillary     Status: Abnormal   Collection Time: 02/19/20 11:24 PM  Result Value Ref Range   Glucose-Capillary 137 (H) 70 - 99 mg/dL    Comment: Glucose reference range applies only to samples taken after fasting for at least 8 hours.  Glucose, capillary     Status:  Abnormal   Collection Time: 02/20/20  3:54 AM  Result Value Ref Range   Glucose-Capillary 152 (H) 70 - 99 mg/dL    Comment: Glucose reference range applies only to samples taken after fasting for at least 8 hours.  CBC     Status: Abnormal   Collection Time: 02/20/20  6:01 AM  Result Value Ref Range   WBC 19.5 (H) 4.0 - 10.5 K/uL   RBC 3.73 (L) 4.22 - 5.81 MIL/uL   Hemoglobin 10.2 (L) 13.0 - 17.0 g/dL   HCT 32.0 (L) 39.0 - 52.0 %   MCV 85.8 80.0 - 100.0 fL   MCH 27.3 26.0 - 34.0 pg   MCHC 31.9 30.0 - 36.0 g/dL   RDW 17.2 (H) 11.5 - 15.5 %   Platelets 186 150 - 400 K/uL   nRBC 0.0 0.0 - 0.2 %    Comment: Performed at [facility] [address], [place], [place] [address]  Basic metabolic panel     Status: Abnormal   Collection Time: 02/20/20  6:01 AM  Result Value Ref Range   Sodium 140 135 - 145 mmol/L   Potassium 4.9 3.5 - 5.1 mmol/L   Chloride 100 98 - 111 mmol/L   CO2 24 22 - 32 mmol/L   Glucose, Bld 199 (H) 70 - 99 mg/dL    Comment: Glucose reference range applies only to samples taken after fasting for at least 8 hours.   BUN 64 (H) 8 - 23 mg/dL   Creatinine, Ser 2.98 (H) 0.61 - 1.24 mg/dL   Calcium 9.6 8.9 - 10.3 mg/dL   GFR calc non Af Amer 20 (L) >60 mL/min   GFR calc Af Amer 23 (L) >60 mL/min   Anion gap 16 (H) 5 - 15    Comment: Performed at [facility] [address] [place], [place] [address]  Glucose, capillary     Status: Abnormal   Collection Time: 02/20/20  7:12 AM  Result Value Ref Range   Glucose-Capillary 180 (H) 70 - 99 mg/dL    Comment: Glucose reference range applies only to samples taken after fasting for at least 8 hours.  Glucose, capillary     Status: Abnormal   Collection Time: 02/20/20 11:09 AM  Result Value Ref Range   Glucose-Capillary 171 (H) 70 - 99 mg/dL    Comment: Glucose reference range applies only to samples taken after fasting for at least 8 hours.  Culture, respiratory (non-expectorated)     Status: None  (Preliminary result)   Collection Time: 02/20/20 12:38 PM   Specimen: Tracheal Aspirate; Respiratory  Result Value Ref Range   Specimen Description TRACHEAL ASPIRATE    Special Requests NONE  Gram Stain      RARE WBC PRESENT, PREDOMINANTLY PMN RARE SQUAMOUS EPITHELIAL CELLS PRESENT FEW GRAM POSITIVE RODS RARE GRAM POSITIVE COCCI    Culture      CULTURE REINCUBATED FOR BETTER GROWTH Performed at [facility], [place] [address] [place], [place] [address]    Report Status PENDING   Culture, blood (routine x 2)     Status: None (Preliminary result)   Collection Time: 02/20/20 12:50 PM   Specimen: BLOOD LEFT HAND  Result Value Ref Range   Specimen Description BLOOD LEFT HAND    Special Requests      BOTTLES DRAWN AEROBIC AND ANAEROBIC Blood Culture adequate volume   Culture  Setup Time      GRAM POSITIVE COCCI IN CHAINS IN BOTH AEROBIC AND ANAEROBIC BOTTLES CRITICAL RESULT CALLED TO, READ BACK BY AND VERIFIED WITH: Jens Som AMEND [id number] FCP Performed at [facility], [place] [address], [place], [place] [address]    Culture GRAM POSITIVE COCCI    Report Status PENDING   Blood Culture ID Panel (Reflexed)     Status: Abnormal   Collection Time: 02/20/20 12:50 PM  Result Value Ref Range   Enterococcus species DETECTED (A) NOT DETECTED    Comment: CRITICAL RESULT CALLED TO, READ BACK BY AND VERIFIED WITH: PHARMD KAREN AMEND [id number] FCP    Vancomycin resistance NOT DETECTED NOT DETECTED   Listeria monocytogenes NOT DETECTED NOT DETECTED   Staphylococcus species DETECTED (A) NOT DETECTED    Comment: Methicillin (oxacillin) resistant coagulase negative staphylococcus. Possible blood culture contaminant (unless isolated from more than one blood culture draw or clinical case suggests pathogenicity). No antibiotic treatment is indicated for blood  culture contaminants. CRITICAL RESULT CALLED TO, READ BACK BY AND VERIFIED WITH: PHARMD KAREN AMEND [id number] FCP     Staphylococcus aureus (BCID) NOT DETECTED NOT DETECTED   Methicillin resistance DETECTED (A) NOT DETECTED    Comment: CRITICAL RESULT CALLED TO, READ BACK BY AND VERIFIED WITH: PHARMD KAREN AMEND [id number] FCP    Streptococcus species NOT DETECTED NOT DETECTED   Streptococcus agalactiae NOT DETECTED NOT DETECTED   Streptococcus pneumoniae NOT DETECTED NOT DETECTED   Streptococcus pyogenes NOT DETECTED NOT DETECTED   Acinetobacter baumannii NOT DETECTED NOT DETECTED   Enterobacteriaceae species NOT DETECTED NOT DETECTED   Enterobacter cloacae complex NOT DETECTED NOT DETECTED   Escherichia coli NOT DETECTED NOT DETECTED   Klebsiella oxytoca NOT DETECTED NOT DETECTED   Klebsiella pneumoniae NOT DETECTED NOT DETECTED   Proteus species NOT DETECTED NOT DETECTED   Serratia marcescens NOT DETECTED NOT DETECTED   Haemophilus influenzae NOT DETECTED NOT DETECTED   Neisseria meningitidis NOT DETECTED NOT DETECTED   Pseudomonas aeruginosa NOT DETECTED NOT DETECTED   Candida albicans NOT DETECTED NOT DETECTED   Candida glabrata NOT DETECTED NOT DETECTED   Candida krusei NOT DETECTED NOT DETECTED   Candida parapsilosis NOT DETECTED NOT DETECTED   Candida tropicalis NOT DETECTED NOT DETECTED    Comment: Performed at [facility] [address], [place], [place] [address]  Culture, blood (routine x 2)     Status: None (Preliminary result)   Collection Time: 02/20/20 12:58 PM   Specimen: BLOOD LEFT ARM  Result Value Ref Range   Specimen Description BLOOD LEFT ARM    Special Requests      BOTTLES DRAWN AEROBIC AND ANAEROBIC Blood Culture adequate volume   Culture      NO GROWTH <  24 HOURS Performed at [facility], [place] [address] [place], [place] [address]    Report Status PENDING   Renal function panel     Status: Abnormal   Collection Time: 02/20/20  2:55 PM  Result Value Ref Range   Sodium 139 135 - 145 mmol/L   Potassium 6.0 (H) 3.5 - 5.1 mmol/L    Comment: SLIGHT  HEMOLYSIS   Chloride 98 98 - 111 mmol/L   CO2 22 22 - 32 mmol/L   Glucose, Bld 208 (H) 70 - 99 mg/dL    Comment: Glucose reference range applies only to samples taken after fasting for at least 8 hours.   BUN 76 (H) 8 - 23 mg/dL   Creatinine, Ser 3.20 (H) 0.61 - 1.24 mg/dL   Calcium 9.8 8.9 - 10.3 mg/dL   Phosphorus 5.0 (H) 2.5 - 4.6 mg/dL   Albumin 2.6 (L) 3.5 - 5.0 g/dL   GFR calc non Af Amer 18 (L) >60 mL/min   GFR calc Af Amer 21 (L) >60 mL/min   Anion gap 19 (H) 5 - 15    Comment: Performed at [facility] [address] [place], [place] [address]  CBC     Status: Abnormal   Collection Time: 02/20/20  2:55 PM  Result Value Ref Range   WBC 22.1 (H) 4.0 - 10.5 K/uL   RBC 3.65 (L) 4.22 - 5.81 MIL/uL   Hemoglobin 9.8 (L) 13.0 - 17.0 g/dL   HCT 30.8 (L) 39.0 - 52.0 %   MCV 84.4 80.0 - 100.0 fL   MCH 26.8 26.0 - 34.0 pg   MCHC 31.8 30.0 - 36.0 g/dL   RDW 17.3 (H) 11.5 - 15.5 %   Platelets 193 150 - 400 K/uL   nRBC 0.0 0.0 - 0.2 %    Comment: Performed at [facility] [address] [place], [place] [address]  Glucose, capillary     Status: Abnormal   Collection Time: 02/20/20  3:36 PM  Result Value Ref Range   Glucose-Capillary 197 (H) 70 - 99 mg/dL    Comment: Glucose reference range applies only to samples taken after fasting for at least 8 hours.  Glucose, capillary     Status: Abnormal   Collection Time: 02/20/20  7:51 PM  Result Value Ref Range   Glucose-Capillary 168 (H) 70 - 99 mg/dL    Comment: Glucose reference range applies only to samples taken after fasting for at least 8 hours.   Comment 1 Notify RN   Glucose, capillary     Status: Abnormal   Collection Time: 02/20/20 11:52 PM  Result Value Ref Range   Glucose-Capillary 163 (H) 70 - 99 mg/dL    Comment: Glucose reference range applies only to samples taken after fasting for at least 8 hours.   Comment 1 Notify RN   CBC     Status: Abnormal   Collection Time: 02/21/20  2:52 AM  Result Value  Ref Range   WBC 18.9 (H) 4.0 - 10.5 K/uL   RBC 3.56 (L) 4.22 - 5.81 MIL/uL   Hemoglobin 9.4 (L) 13.0 - 17.0 g/dL   HCT 29.5 (L) 39.0 - 52.0 %   MCV 82.9 80.0 - 100.0 fL   MCH 26.4 26.0 - 34.0 pg   MCHC 31.9 30.0 - 36.0 g/dL   RDW 17.2 (H) 11.5 - 15.5 %   Platelets 185 150 - 400 K/uL   nRBC 0.0 0.0 - 0.2 %  Comment: Performed at [facility], [place] [address] [place], [place] [address]  Glucose, capillary     Status: Abnormal   Collection Time: 02/21/20  3:44 AM  Result Value Ref Range   Glucose-Capillary 136 (H) 70 - 99 mg/dL    Comment: Glucose reference range applies only to samples taken after fasting for at least 8 hours.   Comment 1 Notify RN   Basic metabolic panel     Status: Abnormal   Collection Time: 02/21/20  5:40 AM  Result Value Ref Range   Sodium 141 135 - 145 mmol/L   Potassium 5.6 (H) 3.5 - 5.1 mmol/L   Chloride 100 98 - 111 mmol/L   CO2 23 22 - 32 mmol/L   Glucose, Bld 189 (H) 70 - 99 mg/dL    Comment: Glucose reference range applies only to samples taken after fasting for at least 8 hours.   BUN 96 (H) 8 - 23 mg/dL   Creatinine, Ser 3.80 (H) 0.61 - 1.24 mg/dL   Calcium 9.9 8.9 - 10.3 mg/dL   GFR calc non Af Amer 15 (L) >60 mL/min   GFR calc Af Amer 17 (L) >60 mL/min   Anion gap 18 (H) 5 - 15    Comment: Performed at [facility] [address], [place], [place] [address]  Glucose, capillary     Status: Abnormal   Collection Time: 02/21/20  7:23 AM  Result Value Ref Range   Glucose-Capillary 149 (H) 70 - 99 mg/dL    Comment: Glucose reference range applies only to samples taken after fasting for at least 8 hours.  Glucose, capillary     Status: Abnormal   Collection Time: 02/21/20 11:14 AM  Result Value Ref Range   Glucose-Capillary 133 (H) 70 - 99 mg/dL    Comment: Glucose reference range applies only to samples taken after fasting for at least 8 hours.  Glucose, capillary     Status: Abnormal   Collection Time: 02/21/20  3:31 PM  Result  Value Ref Range   Glucose-Capillary 184 (H) 70 - 99 mg/dL    Comment: Glucose reference range applies only to samples taken after fasting for at least 8 hours.    MICRO: reveiwed IMAGING: No results found.   Assessment/Plan:  74yo M with hx of severe covid, recovered with sequalae of respiratory failure s/p trach, s/p peg admitted for hemoptysis, increase work of breathing with intermittent fever found to have hospital onset bacteremia with enterococcus, staph epi bacteremia suspect that this is real rather than contaminant. Question whether that this is HD related vs. Secondary to pneumonia  - continue on vancomycin (discontinue FQ) - await to see sensitivities from trach specimens - please get TTE - agree with line holiday and removal of HD - repeat blood cx on 5/27 to see bacteremia is not persistent  [facility] [phone]

## 2020-02-21 NOTE — Progress Notes (Signed)
PROGRESS NOTE    Shane Banks  IWO:[id number] DOB: Oct 19, 1945 DOA: 02/14/2020 PCP: [name], MD   Brief Narrative:  HPI On 02/14/2020 by [name], NP (PCCM) 74 year old male with past medical history as below, which is significant for Atrial fibrillation and stroke. He was admitted to [facility] for [place] 19 pneumonia, which unfortunately progressed to ARDS.  It seems as though his highest vent settings were 60% FiO2 and 8 of PEEP.  He had very little improvement in his ventilatory and oxygenation use over the course of his hospitalization and ultimately underwent tracheostomy and was [facility] [place] on February 23. His course at [facility] was complicated by ongoing infections including UTI, MDRO pseudomonas pneumonia, and stenotrophomonas bacteremia. As a result of sepsis and shock he developed renal failure requiring dialysis, which he now receives M/W/F. In the morning hours oh 5/19 he developed hemoptysis. He had previously been taken off anticoagulation (for AF) due to anemia with positive hemoccult. Transferred to [facility] ED for evaluation of hemoptysis. Prior to the transfer he had been tolerating 28% ATC.  Assessment & Plan   Acute on chronic hypoxemic respiratory failure -Trach status post Covid infection earlier this year -Oxygenation had improved and patient was able to tolerate 28% ATC however now back on the vent for several days and has been intolerant of the weaning process -He has had multiple pneumonias of different organisms and has been treated with multiple antibiotics including Fortaz, Merrem, cefepime, Toprol mycin, vancomycin, azithromycin, ceftriaxone, tigecycline, avycaz, merrem, flagyl.  -Most recently pneumonia has been MDRO Pseudomonas for which she was treated with Avycaz and mycin [facility]bulizers at [facility] -He likely has ongoing aspiration -Now with hemoptysis and aspirated blood.  Per critical care note also seems  to be originating from stoma.  Question ulceration.  Patient has cuffed trach placed by ENT.  No active bleeding on bronchoscopy.  Atrial fibrillation, paroxysmal -Wound discontinued due to bradycardia.  Anticoagulation discontinued due to suspected GI bleed with several transfusions. -Currently in sinus rhythm today  Sepsis, new left upper lobe pneumonia, Enterococcus bacteremia -Chest x-ray shows persistent bilateral lower lobe infiltrates with small bilateral pleural effusions -High suspicion of ongoing aspiration -Blood culture GPC -Patient was started on Levaquin, but this was discontinued -Now on vancomycin with HD -Infectious disease consulted and appreciated  -Remove catheter today after HD- planning for line holiday -will order echocardiogram  End-stage renal disease -Nephrology consulted and appreciated -Continue hemodialysis  Chronic anemia -Likely secondary to ESRD, critical illness -Status post blood transfusion -Continue to monitor  Sacral pressure ulcer -Unstageable -Wound care consulted  Cachexia, severe protein malnutrition -Tube feeds via PEG  DVT Prophylaxis  SCDs  Code Status: DNR  Family Communication:   Disposition Plan:  Status is: Inpatient  Remains inpatient appropriate because:IV treatments appropriate due to intensity of illness or inability to take PO   Dispo: The [facility]              Anticipated d/c is to: TBD              Anticipated d/c date is: > 3 days              Patient currently is not medically stable to d/c.   Consultants PCCM Nephrology ENT Infectious disease  Procedures  ENT Flex scope FOB  Antibiotics   Anti-infectives (From admission, onward)   Start     Dose/Rate Route Frequency Ordered  Stop   02/22/20 1200  vancomycin (VANCOCIN) IVPB 500 mg/100 ml premix  Status:  Discontinued     500 mg 100 mL/hr over 60 Minutes Intravenous Every T-Th-Sa (Hemodialysis) 02/20/20 1253 02/21/20 1040    02/21/20 1200  vancomycin (VANCOREADY) IVPB 500 mg/100 mL     500 mg 100 mL/hr over 60 Minutes Intravenous  Once 02/21/20 0956 02/21/20 1302   02/21/20 0714  vancomycin (VANCOCIN) 500-5 MG/100ML-% IVPB  Status:  Discontinued    Note to Pharmacy: Ashley Akin   : cabinet override      02/21/20 0714 02/21/20 1346   02/20/20 1400  vancomycin (VANCOREADY) IVPB 1250 mg/250 mL     1,250 mg 166.7 mL/hr over 90 Minutes Intravenous  Once 02/20/20 1253 02/20/20 1538   02/20/20 1200  levofloxacin (LEVAQUIN) IVPB 500 mg  Status:  Discontinued     500 mg 100 mL/hr over 60 Minutes Intravenous Every 48 hours 02/20/20 1020 02/21/20 0943   02/15/20 1545  vancomycin (VANCOCIN) IVPB 1000 mg/200 mL premix  Status:  Discontinued     1,000 mg 200 mL/hr over 60 Minutes Intravenous Every 24 hours 02/14/20 1530 02/14/20 1745   02/15/20 0330  ceFEPIme (MAXIPIME) 2 g in sodium chloride 0.9 % 100 mL IVPB  Status:  Discontinued     2 g 200 mL/hr over 30 Minutes Intravenous Every 12 hours 02/14/20 1531 02/14/20 1531   02/14/20 1531  ceFEPIme (MAXIPIME) 2 g in sodium chloride 0.9 % 100 mL IVPB  Status:  Discontinued     2 g 200 mL/hr over 30 Minutes Intravenous Every 12 hours 02/14/20 1531 02/14/20 1745   02/14/20 1530  ceFEPIme (MAXIPIME) 2 g in sodium chloride 0.9 % 100 mL IVPB  Status:  Discontinued     2 g 200 mL/hr over 30 Minutes Intravenous  Once 02/14/20 1520 02/14/20 1531   02/14/20 1530  vancomycin (VANCOREADY) IVPB 1500 mg/300 mL  Status:  Discontinued     1,500 mg 150 mL/hr over 120 Minutes Intravenous  Once 02/14/20 1530 02/14/20 1745      Subjective:   Shane Banks seen and examined today.  Not verbal or interactive  Objective:   Vitals:   02/21/20 1230 02/21/20 1245 02/21/20 1300 02/21/20 1315  BP: (!) 89/63 99/64 (!) 88/70 107/72  Pulse: (!) 111 (!) 110 (!) 111 (!) 113  Resp: (!) 34 (!) 34 (!) 43 (!) 44  Temp:    97.6 F (36.4 C)  TempSrc:    Oral  SpO2: 100% 99% 100% 100%  Weight:       Height:        Intake/Output Summary (Last 24 hours) at 02/21/2020 1406 Last data filed at 02/21/2020 1315 Gross per 24 hour  Intake 1465.09 ml  Output 1400 ml  Net 65.09 ml   Filed Weights   02/20/20 0500 02/21/20 0500 02/21/20 0900  Weight: 58.6 kg 59.9 kg 59.3 kg    Exam  General: Well developed, chronically ill appearing, NAD  HEENT: NCAT, mucous membranes moist.   Neck: Trach   Cardiovascular: S1 S2 auscultated, tachycardic  Respiratory: Tachypneic, on vent  Abdomen: Soft, nontender, nondistended, + bowel sounds, peg  Extremities: warm dry without cyanosis clubbing or edema  Neuro: Awake  Psych: cannot assess   Data Reviewed: I have personally reviewed following labs and imaging studies  CBC: Recent Labs  Lab 02/14/20 1433 02/15/20 0501 02/18/20 0415 02/19/20 0557 02/20/20 0601 02/20/20 1455 02/21/20 0252  WBC 16.1*   < > 15.5*  17.4* 19.5* 22.1* 18.9*  NEUTROABS 12.2*  --   --   --   --   --   --   HGB 7.7*   < > 9.8* 9.6* 10.2* 9.8* 9.4*  HCT 24.7*   < > 30.4* 30.2* 32.0* 30.8* 29.5*  MCV 85.8   < > 84.7 84.1 85.8 84.4 82.9  PLT 128*   < > 175 208 186 193 185   < > = values in this interval not displayed.   Basic Metabolic Panel: Recent Labs  Lab 02/15/20 0501 02/15/20 0501 02/15/20 2003 02/16/20 0449 02/16/20 0449 02/17/20 0343 02/17/20 0343 02/18/20 0415 02/19/20 0557 02/20/20 0601 02/20/20 1455 02/21/20 0540  NA 140   < >  --  138   < > 138   < > 140 143 140 139 141  K 3.7   < >  --  4.5   < > 3.7   < > 4.0 5.0 4.9 6.0* 5.6*  CL 101   < >  --  101   < > 98   < > 101 100 100 98 100  CO2 27   < >  --  23   < > 26   < > _0 GLUCOSE 102*   < >  --  86   < > 164*   < > 156* 154* 199* 208* 189*  BUN 78*   < >  --  84*   < > 40*   < > 62* 86* 64* 76* 96*  CREATININE 2.09*   < >  --  3.00*   < > 1.97*   < > 2.72* 3.75* 2.98* 3.20* 3.80*  CALCIUM 10.1   < >  --  9.6   < > 9.2   < > 9.2 9.9 9.6 9.8 9.9  MG 1.8  --   --  2.3   --  2.0  --   --   --   --   --   --   PHOS 1.4*  --  4.6 5.8*  --  3.6  --   --   --   --  5.0*  --    < > = values in this interval not displayed.   GFR: Estimated Creatinine Clearance: 14.7 mL/min (A) (by C-G formula based on SCr of 3.8 mg/dL (H)). Liver Function Tests: Recent Labs  Lab 02/14/20 1433 02/20/20 1455  AST 29  --   ALT 24  --   ALKPHOS 116  --   BILITOT 1.1  --   PROT 8.3*  --   ALBUMIN 3.4* 2.6*   No results for input(s): LIPASE, AMYLASE in the last 168 hours. No results for input(s): AMMONIA in the last 168 hours. Coagulation Profile: Recent Labs  Lab 02/14/20 1433  INR 1.2   Cardiac Enzymes: No results for input(s): CKTOTAL, CKMB, CKMBINDEX, TROPONINI in the last 168 hours. BNP (last 3 results) No results for input(s): PROBNP in the last 8760 hours. HbA1C: No results for input(s): HGBA1C in the last 72 hours. CBG: Recent Labs  Lab 02/20/20 1951 02/20/20 2352 02/21/20 0344 02/21/20 0723 02/21/20 1114  GLUCAP 168* 163* 136* 149* 133*   Lipid Profile: No results for input(s): CHOL, HDL, LDLCALC, TRIG, CHOLHDL, LDLDIRECT in the last 72 hours. Thyroid Function Tests: No results for input(s): TSH, T4TOTAL, FREET4, T3FREE, THYROIDAB in the last 72 hours. Anemia Panel: No results for input(s): VITAMINB12, FOLATE, FERRITIN, TIBC, IRON, RETICCTPCT in  the last 72 hours. Urine analysis:    Component Value Date/Time   COLORURINE YELLOW 12/11/2019 1230   APPEARANCEUR HAZY (A) 12/11/2019 1230   LABSPEC 1.018 12/11/2019 1230   PHURINE 6.0 12/11/2019 1230   GLUCOSEU NEGATIVE 12/11/2019 1230   HGBUR NEGATIVE 12/11/2019 1230   BILIRUBINUR NEGATIVE 12/11/2019 1230   KETONESUR NEGATIVE 12/11/2019 1230   PROTEINUR 30 (A) 12/11/2019 1230   NITRITE NEGATIVE 12/11/2019 1230   LEUKOCYTESUR LARGE (A) 12/11/2019 1230   Sepsis Labs: _0 (procalcitonin:4,lacticidven:4)  ) Recent Results (from the past 240 hour(s))  Culture, blood (routine x 2)      Status: None   Collection Time: 02/14/20  2:55 PM   Specimen: BLOOD RIGHT WRIST  Result Value Ref Range Status   Specimen Description BLOOD RIGHT WRIST  Final   Special Requests   Final    BOTTLES DRAWN AEROBIC AND ANAEROBIC Blood Culture results may not be optimal due to an excessive volume of blood received in culture bottles   Culture   Final    NO GROWTH 5 DAYS Performed at [facility], [place] [address] [place], [place] [address]    Report Status 02/19/2020 FINAL  Final  SARS Coronavirus 2 by RT PCR (hospital order, performed in [facility] hospital lab) Nasopharyngeal Nasopharyngeal Swab     Status: None   Collection Time: 02/14/20  3:00 PM   Specimen: Nasopharyngeal Swab  Result Value Ref Range Status   SARS Coronavirus 2 NEGATIVE NEGATIVE Final    Comment: (NOTE) SARS-CoV-2 target nucleic acids are NOT DETECTED. The SARS-CoV-2 RNA is generally detectable in upper and lower respiratory specimens during the acute phase of infection. The lowest concentration of SARS-CoV-2 viral copies this assay can detect is 250 copies / mL. A negative result does not preclude SARS-CoV-2 infection and should not be used as the sole basis for treatment or other patient management decisions.  A negative result may occur with improper specimen collection / handling, submission of specimen other than nasopharyngeal swab, presence of viral mutation(s) within the areas targeted by this assay, and inadequate number of viral copies (<250 copies / mL). A negative result must be combined with clinical observations, patient history, and epidemiological information. Fact Sheet for Patients:   StrictlyIdeas.no Fact Sheet for Healthcare Providers: BankingDealers.co.za This test is not yet approved or cleared  by the [place] FDA and has been authorized for detection and/or diagnosis of SARS-CoV-2 by FDA under an Emergency Use Authorization (EUA).   This EUA will remain in effect (meaning this test can be used) for the duration of the COVID-19 declaration under Section 564(b)(1) of the Act, 21 U.S.C. section 360bbb-3(b)(1), unless the authorization is terminated or revoked sooner. Performed at [facility], [place] [address], [place], [place] [address]   Culture, bal-quantitative     Status: None   Collection Time: 02/14/20  5:17 PM   Specimen: Bronchoalveolar Lavage; Respiratory  Result Value Ref Range Status   Specimen Description BRONCHIAL ALVEOLAR LAVAGE  Final   Special Requests Normal  Final   Gram Stain   Final    RARE WBC PRESENT,BOTH PMN AND MONONUCLEAR NO ORGANISMS SEEN    Culture   Final    NO GROWTH 2 DAYS Performed at [facility], [address], [place], [place] [address]    Report Status 02/17/2020 FINAL  Final  Culture, blood (routine x 2)     Status: None   Collection Time: 02/14/20  7:20 PM  Specimen: BLOOD  Result Value Ref Range Status   Specimen Description BLOOD RIGHT ANTECUBITAL  Final   Special Requests   Final    BOTTLES DRAWN AEROBIC AND ANAEROBIC Blood Culture adequate volume   Culture   Final    NO GROWTH 5 DAYS Performed at [facility], [address], [place], [place] [address]    Report Status 02/19/2020 FINAL  Final  MRSA PCR Screening     Status: None   Collection Time: 02/16/20  7:30 PM   Specimen: Nasal Mucosa; Nasopharyngeal  Result Value Ref Range Status   MRSA by PCR NEGATIVE NEGATIVE Final    Comment:        The GeneXpert MRSA Assay (FDA approved for NASAL specimens only), is one component of a comprehensive MRSA colonization surveillance program. It is not intended to diagnose MRSA infection nor to guide or monitor treatment for MRSA infections. Performed at [facility], [place] [address], [place], [place] [address]   Culture, blood (Routine X 2) w Reflex to ID Panel     Status: Abnormal (Preliminary result)   Collection Time: 02/17/20  6:26 AM    Specimen: BLOOD RIGHT WRIST  Result Value Ref Range Status   Specimen Description BLOOD RIGHT WRIST  Final   Special Requests AEROBIC BOTTLE ONLY Blood Culture adequate volume  Final   Culture  Setup Time   Final    GRAM POSITIVE COCCI IN CLUSTERS AEROBIC BOTTLE ONLY CRITICAL RESULT CALLED TO, READ BACK BY AND VERIFIED WITH: K. HURTH,PHARMD [id number] 02/19/2020 T. TYSOR    Culture (A)  Final    STAPHYLOCOCCUS SPECIES (COAGULASE NEGATIVE) THE SIGNIFICANCE OF ISOLATING THIS ORGANISM FROM A SINGLE SET OF BLOOD CULTURES WHEN MULTIPLE SETS ARE DRAWN IS UNCERTAIN. PLEASE NOTIFY THE MICROBIOLOGY DEPARTMENT WITHIN ONE WEEK IF SPECIATION AND SENSITIVITIES ARE REQUIRED. Performed at [facility], [place] [address] [place], [place] [address]    Report Status PENDING  Incomplete  Culture, blood (Routine X 2) w Reflex to ID Panel     Status: None (Preliminary result)   Collection Time: 02/17/20  6:26 AM   Specimen: BLOOD RIGHT HAND  Result Value Ref Range Status   Specimen Description BLOOD RIGHT HAND  Final   Special Requests AEROBIC BOTTLE ONLY Blood Culture adequate volume  Final   Culture   Final    NO GROWTH 4 DAYS Performed at [facility], [place] [address], [place], [place] [address]    Report Status PENDING  Incomplete  Blood Culture ID Panel (Reflexed)     Status: Abnormal   Collection Time: 02/17/20  6:26 AM  Result Value Ref Range Status   Enterococcus species NOT DETECTED NOT DETECTED Final   Listeria monocytogenes NOT DETECTED NOT DETECTED Final   Staphylococcus species DETECTED (A) NOT DETECTED Final    Comment: Methicillin (oxacillin) resistant coagulase negative staphylococcus. Possible blood culture contaminant (unless isolated from more than one blood culture draw or clinical case suggests pathogenicity). No antibiotic treatment is indicated for blood  culture contaminants. CRITICAL RESULT CALLED TO, READ BACK BY AND VERIFIED WITH: K. HURTH,PHARMD [id number] 02/19/2020 T. TYSOR     Staphylococcus aureus (BCID) NOT DETECTED NOT DETECTED Final   Methicillin resistance DETECTED (A) NOT DETECTED Final    Comment: CRITICAL RESULT CALLED TO, READ BACK BY AND VERIFIED WITH: K. HURTH,PHARMD [id number] 02/19/2020 T. TYSOR    Streptococcus species NOT DETECTED NOT DETECTED Final   Streptococcus agalactiae NOT DETECTED NOT DETECTED Final  Streptococcus pneumoniae NOT DETECTED NOT DETECTED Final   Streptococcus pyogenes NOT DETECTED NOT DETECTED Final   Acinetobacter baumannii NOT DETECTED NOT DETECTED Final   Enterobacteriaceae species NOT DETECTED NOT DETECTED Final   Enterobacter cloacae complex NOT DETECTED NOT DETECTED Final   Escherichia coli NOT DETECTED NOT DETECTED Final   Klebsiella oxytoca NOT DETECTED NOT DETECTED Final   Klebsiella pneumoniae NOT DETECTED NOT DETECTED Final   Proteus species NOT DETECTED NOT DETECTED Final   Serratia marcescens NOT DETECTED NOT DETECTED Final   Haemophilus influenzae NOT DETECTED NOT DETECTED Final   Neisseria meningitidis NOT DETECTED NOT DETECTED Final   Pseudomonas aeruginosa NOT DETECTED NOT DETECTED Final   Candida albicans NOT DETECTED NOT DETECTED Final   Candida glabrata NOT DETECTED NOT DETECTED Final   Candida krusei NOT DETECTED NOT DETECTED Final   Candida parapsilosis NOT DETECTED NOT DETECTED Final   Candida tropicalis NOT DETECTED NOT DETECTED Final    Comment: Performed at [facility], [place] [address] [place], [place] [address]  Culture, respiratory (non-expectorated)     Status: None   Collection Time: 02/17/20  7:30 AM   Specimen: Tracheal Aspirate; Respiratory  Result Value Ref Range Status   Specimen Description TRACHEAL ASPIRATE  Final   Special Requests Normal  Final   Gram Stain   Final    RARE WBC PRESENT, PREDOMINANTLY PMN NO ORGANISMS SEEN Performed at [facility], [place] [address], [place], [place] [address]    Culture RARE CANDIDA PARAPSILOSIS  Final   Report Status 02/20/2020 FINAL   Final  Culture, respiratory (non-expectorated)     Status: None (Preliminary result)   Collection Time: 02/20/20 12:38 PM   Specimen: Tracheal Aspirate; Respiratory  Result Value Ref Range Status   Specimen Description TRACHEAL ASPIRATE  Final   Special Requests NONE  Final   Gram Stain   Final    RARE WBC PRESENT, PREDOMINANTLY PMN RARE SQUAMOUS EPITHELIAL CELLS PRESENT FEW GRAM POSITIVE RODS RARE GRAM POSITIVE COCCI    Culture   Final    CULTURE REINCUBATED FOR BETTER GROWTH Performed at [facility], [place] [address] [place], [place] [address]    Report Status PENDING  Incomplete  Culture, blood (routine x 2)     Status: None (Preliminary result)   Collection Time: 02/20/20 12:50 PM   Specimen: BLOOD LEFT HAND  Result Value Ref Range Status   Specimen Description BLOOD LEFT HAND  Final   Special Requests   Final    BOTTLES DRAWN AEROBIC AND ANAEROBIC Blood Culture adequate volume   Culture  Setup Time   Final    GRAM POSITIVE COCCI IN CHAINS IN BOTH AEROBIC AND ANAEROBIC BOTTLES CRITICAL RESULT CALLED TO, READ BACK BY AND VERIFIED WITH: Jens Som AMEND [id number] FCP Performed at [facility], [place] [address], [place], [place] [address]    Culture [facility] POSITIVE COCCI  Final   Report Status PENDING  Incomplete  Blood Culture ID Panel (Reflexed)     Status: Abnormal   Collection Time: 02/20/20 12:50 PM  Result Value Ref Range Status   Enterococcus species DETECTED (A) NOT DETECTED Final    Comment: CRITICAL RESULT CALLED TO, READ BACK BY AND VERIFIED WITH: PHARMD KAREN AMEND [phone] FCP    Vancomycin resistance NOT DETECTED NOT DETECTED Final   Listeria monocytogenes NOT DETECTED NOT DETECTED Final   Staphylococcus species DETECTED (A) NOT DETECTED Final    Comment: Methicillin (oxacillin) resistant  coagulase negative staphylococcus. Possible blood culture contaminant (unless isolated from more than one blood culture draw or clinical case suggests  pathogenicity). No antibiotic treatment is indicated for blood  culture contaminants. CRITICAL RESULT CALLED TO, READ BACK BY AND VERIFIED WITH: PHARMD KAREN AMEND [phone] FCP    Staphylococcus aureus (BCID) NOT DETECTED NOT DETECTED Final   Methicillin resistance DETECTED (A) NOT DETECTED Final    Comment: CRITICAL RESULT CALLED TO, READ BACK BY AND VERIFIED WITH: PHARMD KAREN AMEND [phone] FCP    Streptococcus species NOT DETECTED NOT DETECTED Final   Streptococcus agalactiae NOT DETECTED NOT DETECTED Final   Streptococcus pneumoniae NOT DETECTED NOT DETECTED Final   Streptococcus pyogenes NOT DETECTED NOT DETECTED Final   Acinetobacter baumannii NOT DETECTED NOT DETECTED Final   Enterobacteriaceae species NOT DETECTED NOT DETECTED Final   Enterobacter cloacae complex NOT DETECTED NOT DETECTED Final   Escherichia coli NOT DETECTED NOT DETECTED Final   Klebsiella oxytoca NOT DETECTED NOT DETECTED Final   Klebsiella pneumoniae NOT DETECTED NOT DETECTED Final   Proteus species NOT DETECTED NOT DETECTED Final   Serratia marcescens NOT DETECTED NOT DETECTED Final   Haemophilus influenzae NOT DETECTED NOT DETECTED Final   Neisseria meningitidis NOT DETECTED NOT DETECTED Final   Pseudomonas aeruginosa NOT DETECTED NOT DETECTED Final   Candida albicans NOT DETECTED NOT DETECTED Final   Candida glabrata NOT DETECTED NOT DETECTED Final   Candida krusei NOT DETECTED NOT DETECTED Final   Candida parapsilosis NOT DETECTED NOT DETECTED Final   Candida tropicalis NOT DETECTED NOT DETECTED Final    Comment: Performed at [facility], [place]. [address], [place], [place] [address]  Culture, blood (routine x 2)     Status: None (Preliminary result)   Collection Time: 02/20/20 12:58 PM   Specimen: BLOOD LEFT ARM  Result Value Ref Range Status   Specimen Description BLOOD LEFT ARM  Final   Special Requests   Final    BOTTLES DRAWN AEROBIC AND ANAEROBIC Blood Culture adequate volume    Culture   Final    NO GROWTH < 24 HOURS Performed at [facility], [place] [address] [place], [place] [address]    Report Status PENDING  Incomplete      Radiology Studies: DG CHEST PORT 1 VIEW  Result Date: 02/19/2020 CLINICAL DATA:  Hypoxia and tachycardia. Ventilator patient. EXAM: PORTABLE CHEST 1 VIEW COMPARISON:  Radiographs 02/16/2020 and 02/15/2020. Radiographs 12/21/2019. FINDINGS: 1543 hours. Tracheostomy and right IJ hemodialysis catheters appear unchanged. The heart size and mediastinal contours are stable with aortic atherosclerosis. Interval improvement in the previously demonstrated left lower lobe consolidation. Patchy airspace opacities in both lungs are otherwise stable. No pneumothorax or significant pleural effusion. The bones appear unchanged. IMPRESSION: 1. Interval improvement in left lower lobe infiltrate. The patchy bilateral airspace opacities are otherwise unchanged, likely related to the patient's viral pneumonia. 2. Stable support system. 3. No acute findings identified. Electronically Signed   By: Richardean Sale M.D.   On: 02/19/2020 16:04     Scheduled Meds: . apixaban  2.5 mg Per Tube BID  . chlorhexidine gluconate (MEDLINE KIT)  15 mL Mouth Rinse BID  . Chlorhexidine Gluconate Cloth  6 each Topical Daily  . Chlorhexidine Gluconate Cloth  6 each Topical Q0600  . diltiazem  30 mg Oral Q8H  . docusate  100 mg Per Tube BID  . feeding supplement (PRO-STAT SUGAR FREE 64)  30 mL Per Tube BID  . insulin aspart  0-9 Units Subcutaneous Q4H  . mouth rinse  15 mL Mouth Rinse 10 times per day  . pantoprazole (PROTONIX) IV  40 mg Intravenous Q24H  . polyethylene glycol  17 g Per Tube Daily   Continuous Infusions: . sodium chloride    . feeding supplement (NEPRO CARB STEADY)       LOS: 7 days   Time Spent in minutes   45 minutes  Kailly Richoux D.O. on 02/21/2020 at 2:06 PM  Between 7am to 7pm - Please see pager noted on [email or web address]  After 7pm go to  [email or web address]  And look for the night coverage person covering for me after hours  [facility] Office  [phone]

## 2020-02-22 ENCOUNTER — Inpatient Hospital Stay (HOSPITAL_COMMUNITY): Payer: Medicare HMO

## 2020-02-22 DIAGNOSIS — I361 Nonrheumatic tricuspid (valve) insufficiency: Secondary | ICD-10-CM

## 2020-02-22 LAB — CBC
HCT: 30.2 % — ABNORMAL LOW (ref 39.0–52.0)
Hemoglobin: 9.5 g/dL — ABNORMAL LOW (ref 13.0–17.0)
MCH: 26.3 pg (ref 26.0–34.0)
MCHC: 31.5 g/dL (ref 30.0–36.0)
MCV: 83.7 fL (ref 80.0–100.0)
Platelets: 202 10*3/uL (ref 150–400)
RBC: 3.61 MIL/uL — ABNORMAL LOW (ref 4.22–5.81)
RDW: 17.5 % — ABNORMAL HIGH (ref 11.5–15.5)
WBC: 19.7 10*3/uL — ABNORMAL HIGH (ref 4.0–10.5)
nRBC: 0 % (ref 0.0–0.2)

## 2020-02-22 LAB — GLUCOSE, CAPILLARY
Glucose-Capillary: 159 mg/dL — ABNORMAL HIGH (ref 70–99)
Glucose-Capillary: 160 mg/dL — ABNORMAL HIGH (ref 70–99)
Glucose-Capillary: 167 mg/dL — ABNORMAL HIGH (ref 70–99)
Glucose-Capillary: 176 mg/dL — ABNORMAL HIGH (ref 70–99)
Glucose-Capillary: 177 mg/dL — ABNORMAL HIGH (ref 70–99)
Glucose-Capillary: 181 mg/dL — ABNORMAL HIGH (ref 70–99)

## 2020-02-22 LAB — BASIC METABOLIC PANEL
Anion gap: 18 — ABNORMAL HIGH (ref 5–15)
BUN: 62 mg/dL — ABNORMAL HIGH (ref 8–23)
CO2: 24 mmol/L (ref 22–32)
Calcium: 10 mg/dL (ref 8.9–10.3)
Chloride: 95 mmol/L — ABNORMAL LOW (ref 98–111)
Creatinine, Ser: 2.61 mg/dL — ABNORMAL HIGH (ref 0.61–1.24)
GFR calc Af Amer: 27 mL/min — ABNORMAL LOW (ref >60)
GFR calc non Af Amer: 23 mL/min — ABNORMAL LOW (ref >60)
Glucose, Bld: 182 mg/dL — ABNORMAL HIGH (ref 70–99)
Potassium: 4.2 mmol/L (ref 3.5–5.1)
Sodium: 137 mmol/L (ref 135–145)

## 2020-02-22 LAB — C-REACTIVE PROTEIN: CRP: 24.2 mg/dL — ABNORMAL HIGH (ref <1.0)

## 2020-02-22 LAB — CULTURE, BLOOD (ROUTINE X 2)
Culture: NO GROWTH
Special Requests: ADEQUATE

## 2020-02-22 LAB — ECHOCARDIOGRAM COMPLETE
Height: 69 in
Weight: 2035.29 oz

## 2020-02-22 LAB — SEDIMENTATION RATE: Sed Rate: 100 mm/hr — ABNORMAL HIGH (ref 0–16)

## 2020-02-22 MED ORDER — METRONIDAZOLE 500 MG PO TABS
500.0000 mg | ORAL_TABLET | Freq: Three times a day (TID) | ORAL | Status: DC
  Administered 2020-02-22 – 2020-03-30 (×108): 500 mg
  Filled 2020-02-22 (×110): qty 1

## 2020-02-22 MED ORDER — SODIUM CHLORIDE 0.9 % IV SOLN
2.0000 g | INTRAVENOUS | Status: DC
  Administered 2020-02-22 – 2020-02-26 (×5): 2 g via INTRAVENOUS
  Filled 2020-02-22 (×6): qty 20

## 2020-02-22 NOTE — Progress Notes (Signed)
CSW attempted to reach Abigail Butts in admissions at [facility] in [employer] without success - a voicemail was left requesting a return call.  Madilyn Fireman, MSW, LCSW-A Transitions of Care  Clinical Social Worker  [facility] Emergency Departments  Medical ICU [phone]

## 2020-02-22 NOTE — Progress Notes (Signed)
[name]   Subjective:   This is a 74 year old gentleman with a history of CVA benign plastic hypertrophy atrial fibrillation developed Covid pneumonia admitted to [facility].  Required ventilator support and eventually tracheostomy.  [facility] 11/21/[facility].  Developed complications from UTI Pseudomonas pneumonia stenotrophomonas bacteremia.  Received IV vancomycin developed acute kidney injury started on dialysis 12/16/2019.  Has been dialysis dependent since.  Was admitted to [facility] with hemoptysis 02/14/2020.  Receives dialysis Monday Wednesday Friday  Removal 1 L 02/21/2020  Blood pressure 104/63 pulse 78 temperature 98.1 O2 sats 94% FiO2 30%  No labs ordered this morning we will order daily renal panel  Insulin sliding scale, Protonix 40 mg every 24 hours, Eliquis 2.5 mg twice daily, Cardizem 30 mg every 8 hours  IV vancomycin    Objective:  Vital signs in last 24 hours:  Temp:  [97.5 F (36.4 C)-101.6 F (38.7 C)] 98.1 F (36.7 C) (05/27 0318) Pulse Rate:  [52-115] 77 (05/27 0700) Resp:  [21-44] 26 (05/27 0723) BP: (80-127)/(48-72) 104/63 (05/27 0700) SpO2:  [93 %-100 %] 94 % (05/27 0723) FiO2 (%):  [30 %] 30 % (05/27 0723) Weight:  [57.7 kg-59.3 kg] 57.7 kg (05/27 0500)  Weight change: -0.6 kg Filed Weights   02/21/20 0500 02/21/20 0900 02/22/20 0500  Weight: 59.9 kg 59.3 kg 57.7 kg    Intake/Output: I/O last 3 completed shifts: In: 1842.2 [Other:60; NG/GT:1681.8; IV Piggyback:100.5] Out: 1890 [Other:1000; Stool:890]   Intake/Output this shift:  No intake/output data recorded.  Genelderly AAM, awake , responsive No jvd or bruits Chestclear to A/P ant and lat RRRbounding precordium, no sig MRG Abd soft ntnd no mass or ascites +bs, +LLQ ostomy, +PEG tube GU normal male MS no joint effusions or deformity Ext1+ dependent edema Feet in boots Neuro more  responsive today , more alert  [facility]   Basic Metabolic Panel: Recent Labs  Lab 02/15/20 2003 02/16/20 0449 02/16/20 0449 02/17/20 0343 02/17/20 0343 02/18/20 0415 02/18/20 0415 02/19/20 0557 02/19/20 0557 02/20/20 0601 02/20/20 1455 02/21/20 0540  NA  --  138   < > 138   < > 140  --  143  --  140 139 141  K  --  4.5   < > 3.7   < > 4.0  --  5.0  --  4.9 6.0* 5.6*  CL  --  101   < > 98   < > 101  --  100  --  100 98 100  CO2  --  23   < > 26   < > 24  --  25  --  _0 GLUCOSE  --  86   < > 164*   < > 156*  --  154*  --  199* 208* 189*  BUN  --  84*   < > 40*   < > 62*  --  86*  --  64* 76* 96*  CREATININE  --  3.00*   < > 1.97*   < > 2.72*  --  3.75*  --  2.98* 3.20* 3.80*  CALCIUM  --  9.6   < > 9.2   < > 9.2   < > 9.9   < > 9.6 9.8 9.9  MG  --  2.3  --  2.0  --   --   --   --   --   --   --   --  PHOS 4.6 5.8*  --  3.6  --   --   --   --   --   --  5.0*  --    < > = values in this interval not displayed.    Liver Function Tests: Recent Labs  Lab 02/20/20 1455  ALBUMIN 2.6*   No results for input(s): LIPASE, AMYLASE in the last 168 hours. No results for input(s): AMMONIA in the last 168 hours.  CBC: Recent Labs  Lab 02/18/20 0415 02/19/20 0557 02/20/20 0601 02/20/20 1455 02/21/20 0252  WBC 15.5* 17.4* 19.5* 22.1* 18.9*  HGB 9.8* 9.6* 10.2* 9.8* 9.4*  HCT 30.4* 30.2* 32.0* 30.8* 29.5*  MCV 84.7 84.1 85.8 84.4 82.9  PLT 175 208 186 193 185    Cardiac Enzymes: No results for input(s): CKTOTAL, CKMB, CKMBINDEX, TROPONINI in the last 168 hours.  BNP: Invalid input(s): POCBNP  CBG: Recent Labs  Lab 02/21/20 1114 02/21/20 1531 02/21/20 1937 02/21/20 2324 02/22/20 0318  GLUCAP 133* 184* 149* 186* 159*    Microbiology: Results for orders placed or performed during the hospital encounter of 02/14/20  Culture, blood (routine x 2)     Status: None   Collection Time: 02/14/20  2:55 PM   Specimen: BLOOD RIGHT WRIST  Result Value Ref Range Status    Specimen Description BLOOD RIGHT WRIST  Final   Special Requests   Final    BOTTLES DRAWN AEROBIC AND ANAEROBIC Blood Culture results may not be optimal due to an excessive volume of blood received in culture bottles   Culture   Final    NO GROWTH 5 DAYS Performed at [facility], [place] [address], [place], [place] [address]    Report Status 02/19/2020 FINAL  Final  SARS Coronavirus 2 by RT PCR (hospital order, performed in [facility] hospital lab) Nasopharyngeal Nasopharyngeal Swab     Status: None   Collection Time: 02/14/20  3:00 PM   Specimen: Nasopharyngeal Swab  Result Value Ref Range Status   SARS Coronavirus 2 NEGATIVE NEGATIVE Final    Comment: (NOTE) SARS-CoV-2 target nucleic acids are NOT DETECTED. The SARS-CoV-2 RNA is generally detectable in upper and lower respiratory specimens during the acute phase of infection. The lowest concentration of SARS-CoV-2 viral copies this assay can detect is 250 copies / mL. A negative result does not preclude SARS-CoV-2 infection and should not be used as the sole basis for treatment or other patient management decisions.  A negative result may occur with improper specimen collection / handling, submission of specimen other than nasopharyngeal swab, presence of viral mutation(s) within the areas targeted by this assay, and inadequate number of viral copies (<250 copies / mL). A negative result must be combined with clinical observations, patient history, and epidemiological information. Fact Sheet for Patients:   StrictlyIdeas.no Fact Sheet for Healthcare Providers: BankingDealers.co.za This test is not yet approved or cleared  by the [place] FDA and has been authorized for detection and/or diagnosis of SARS-CoV-2 by FDA under an Emergency Use Authorization (EUA).  This EUA will remain in effect (meaning this test can be used) for the duration of the COVID-19 declaration under  Section 564(b)(1) of the Act, 21 U.S.C. section 360bbb-3(b)(1), unless the authorization is terminated or revoked sooner. Performed at [facility], [place] [address], [place], [place] [address]   Culture, bal-quantitative     Status: None   Collection Time: 02/14/20  5:17 PM   Specimen: Bronchoalveolar Lavage; Respiratory  Result Value  Ref Range Status   Specimen Description BRONCHIAL ALVEOLAR LAVAGE  Final   Special Requests Normal  Final   Gram Stain   Final    RARE WBC PRESENT,BOTH PMN AND MONONUCLEAR NO ORGANISMS SEEN    Culture   Final    NO GROWTH 2 DAYS Performed at [facility], [address] [place], [place] [address]    Report Status 02/17/2020 FINAL  Final  Culture, blood (routine x 2)     Status: None   Collection Time: 02/14/20  7:20 PM   Specimen: BLOOD  Result Value Ref Range Status   Specimen Description BLOOD RIGHT ANTECUBITAL  Final   Special Requests   Final    BOTTLES DRAWN AEROBIC AND ANAEROBIC Blood Culture adequate volume   Culture   Final    NO GROWTH 5 DAYS Performed at [facility], [place] [address], [place], [place] [address]    Report Status 02/19/2020 FINAL  Final  MRSA PCR Screening     Status: None   Collection Time: 02/16/20  7:30 PM   Specimen: Nasal Mucosa; Nasopharyngeal  Result Value Ref Range Status   MRSA by PCR NEGATIVE NEGATIVE Final    Comment:        The GeneXpert MRSA Assay (FDA approved for NASAL specimens only), is one component of a comprehensive MRSA colonization surveillance program. It is not intended to diagnose MRSA infection nor to guide or monitor treatment for MRSA infections. Performed at [facility], [place] [address] [place], [place] [address]   Culture, blood (Routine X 2) w Reflex to ID Panel     Status: Abnormal (Preliminary result)   Collection Time: 02/17/20  6:26 AM   Specimen: BLOOD RIGHT WRIST  Result Value Ref Range Status   Specimen Description BLOOD RIGHT WRIST  Final    Special Requests AEROBIC BOTTLE ONLY Blood Culture adequate volume  Final   Culture  Setup Time   Final    GRAM POSITIVE COCCI IN CLUSTERS AEROBIC BOTTLE ONLY CRITICAL RESULT CALLED TO, READ BACK BY AND VERIFIED WITH: K. HURTH,PHARMD [id number] 02/19/2020 T. TYSOR    Culture (A)  Final    STAPHYLOCOCCUS SPECIES (COAGULASE NEGATIVE) THE SIGNIFICANCE OF ISOLATING THIS ORGANISM FROM A SINGLE SET OF BLOOD CULTURES WHEN MULTIPLE SETS ARE DRAWN IS UNCERTAIN. PLEASE NOTIFY THE MICROBIOLOGY DEPARTMENT WITHIN ONE WEEK IF SPECIATION AND SENSITIVITIES ARE REQUIRED. Performed at [facility], [place] [address], [place], [place] [address]    Report Status PENDING  Incomplete  Culture, blood (Routine X 2) w Reflex to ID Panel     Status: None (Preliminary result)   Collection Time: 02/17/20  6:26 AM   Specimen: BLOOD RIGHT HAND  Result Value Ref Range Status   Specimen Description BLOOD RIGHT HAND  Final   Special Requests AEROBIC BOTTLE ONLY Blood Culture adequate volume  Final   Culture   Final    NO GROWTH 4 DAYS Performed at [facility], [place] [address] [place], [place] [address]    Report Status PENDING  Incomplete  Blood Culture ID Panel (Reflexed)     Status: Abnormal   Collection Time: 02/17/20  6:26 AM  Result Value Ref Range Status   Enterococcus species NOT DETECTED NOT DETECTED Final   Listeria monocytogenes NOT DETECTED NOT DETECTED Final   Staphylococcus species DETECTED (A) NOT DETECTED Final    Comment: Methicillin (oxacillin) resistant coagulase negative staphylococcus. Possible blood culture contaminant (unless isolated from more  than one blood culture draw or clinical case suggests pathogenicity). No antibiotic treatment is indicated for blood  culture contaminants. CRITICAL RESULT CALLED TO, READ BACK BY AND VERIFIED WITH: K. HURTH,PHARMD [id number] 02/19/2020 T. TYSOR    Staphylococcus aureus (BCID) NOT DETECTED NOT DETECTED Final   Methicillin resistance DETECTED (A) NOT  DETECTED Final    Comment: CRITICAL RESULT CALLED TO, READ BACK BY AND VERIFIED WITH: K. HURTH,PHARMD [id number] 02/19/2020 T. TYSOR    Streptococcus species NOT DETECTED NOT DETECTED Final   Streptococcus agalactiae NOT DETECTED NOT DETECTED Final   Streptococcus pneumoniae NOT DETECTED NOT DETECTED Final   Streptococcus pyogenes NOT DETECTED NOT DETECTED Final   Acinetobacter baumannii NOT DETECTED NOT DETECTED Final   Enterobacteriaceae species NOT DETECTED NOT DETECTED Final   Enterobacter cloacae complex NOT DETECTED NOT DETECTED Final   Escherichia coli NOT DETECTED NOT DETECTED Final   Klebsiella oxytoca NOT DETECTED NOT DETECTED Final   Klebsiella pneumoniae NOT DETECTED NOT DETECTED Final   Proteus species NOT DETECTED NOT DETECTED Final   Serratia marcescens NOT DETECTED NOT DETECTED Final   Haemophilus influenzae NOT DETECTED NOT DETECTED Final   Neisseria meningitidis NOT DETECTED NOT DETECTED Final   Pseudomonas aeruginosa NOT DETECTED NOT DETECTED Final   Candida albicans NOT DETECTED NOT DETECTED Final   Candida glabrata NOT DETECTED NOT DETECTED Final   Candida krusei NOT DETECTED NOT DETECTED Final   Candida parapsilosis NOT DETECTED NOT DETECTED Final   Candida tropicalis NOT DETECTED NOT DETECTED Final    Comment: Performed at [facility], [place], [place], [place] [address]  Culture, respiratory (non-expectorated)     Status: None   Collection Time: 02/17/20  7:30 AM   Specimen: Tracheal Aspirate; Respiratory  Result Value Ref Range Status   Specimen Description TRACHEAL ASPIRATE  Final   Special Requests Normal  Final   Gram Stain   Final    RARE WBC PRESENT, PREDOMINANTLY PMN NO ORGANISMS SEEN Performed at [facility], [place] [address], [place], [place] [address]    Culture RARE CANDIDA PARAPSILOSIS  Final   Report Status 02/20/2020 FINAL  Final  Culture, respiratory (non-expectorated)     Status: None (Preliminary result)   Collection Time:  02/20/20 12:38 PM   Specimen: Tracheal Aspirate; Respiratory  Result Value Ref Range Status   Specimen Description TRACHEAL ASPIRATE  Final   Special Requests NONE  Final   Gram Stain   Final    RARE WBC PRESENT, PREDOMINANTLY PMN RARE SQUAMOUS EPITHELIAL CELLS PRESENT FEW GRAM POSITIVE RODS RARE GRAM POSITIVE COCCI    Culture   Final    CULTURE REINCUBATED FOR BETTER GROWTH Performed at [facility], [place] [address] [place], [place] [address]    Report Status PENDING  Incomplete  Culture, blood (routine x 2)     Status: None (Preliminary result)   Collection Time: 02/20/20 12:50 PM   Specimen: BLOOD LEFT HAND  Result Value Ref Range Status   Specimen Description BLOOD LEFT HAND  Final   Special Requests   Final    BOTTLES DRAWN AEROBIC AND ANAEROBIC Blood Culture adequate volume   Culture  Setup Time   Final    GRAM POSITIVE COCCI IN CHAINS IN BOTH AEROBIC AND ANAEROBIC BOTTLES CRITICAL RESULT CALLED TO, READ BACK BY AND VERIFIED WITH: Jens Som AMEND [id number] FCP Performed at [facility], [place] [address], [place], [place] [address]    Culture GRAM POSITIVE COCCI  Final   Report Status PENDING  Incomplete  Blood Culture ID Panel (Reflexed)     Status: Abnormal   Collection Time: 02/20/20 12:50 PM  Result Value Ref Range Status   Enterococcus species DETECTED (A) NOT DETECTED Final    Comment: CRITICAL RESULT CALLED TO, READ BACK BY AND VERIFIED WITH: PHARMD KAREN AMEND [id number] FCP    Vancomycin resistance NOT DETECTED NOT DETECTED Final   Listeria monocytogenes NOT DETECTED NOT DETECTED Final   Staphylococcus species DETECTED (A) NOT DETECTED Final    Comment: Methicillin (oxacillin) resistant coagulase negative staphylococcus. Possible blood culture contaminant (unless isolated from more than one blood culture draw or clinical case suggests pathogenicity). No antibiotic treatment is indicated for blood  culture contaminants. CRITICAL RESULT CALLED  TO, READ BACK BY AND VERIFIED WITH: PHARMD KAREN AMEND [id number] FCP    Staphylococcus aureus (BCID) NOT DETECTED NOT DETECTED Final   Methicillin resistance DETECTED (A) NOT DETECTED Final    Comment: CRITICAL RESULT CALLED TO, READ BACK BY AND VERIFIED WITH: PHARMD KAREN AMEND [phone] [id number] FCP    Streptococcus species NOT DETECTED NOT DETECTED Final   Streptococcus agalactiae NOT DETECTED NOT DETECTED Final   Streptococcus pneumoniae NOT DETECTED NOT DETECTED Final   Streptococcus pyogenes NOT DETECTED NOT DETECTED Final   Acinetobacter baumannii NOT DETECTED NOT DETECTED Final   Enterobacteriaceae species NOT DETECTED NOT DETECTED Final   Enterobacter cloacae complex NOT DETECTED NOT DETECTED Final   Escherichia coli NOT DETECTED NOT DETECTED Final   Klebsiella oxytoca NOT DETECTED NOT DETECTED Final   Klebsiella pneumoniae NOT DETECTED NOT DETECTED Final   Proteus species NOT DETECTED NOT DETECTED Final   Serratia marcescens NOT DETECTED NOT DETECTED Final   Haemophilus influenzae NOT DETECTED NOT DETECTED Final   Neisseria meningitidis NOT DETECTED NOT DETECTED Final   Pseudomonas aeruginosa NOT DETECTED NOT DETECTED Final   Candida albicans NOT DETECTED NOT DETECTED Final   Candida glabrata NOT DETECTED NOT DETECTED Final   Candida krusei NOT DETECTED NOT DETECTED Final   Candida parapsilosis NOT DETECTED NOT DETECTED Final   Candida tropicalis NOT DETECTED NOT DETECTED Final    Comment: Performed at [facility], [place]. [address], [place], [place] [address]  Culture, blood (routine x 2)     Status: None (Preliminary result)   Collection Time: 02/20/20 12:58 PM   Specimen: BLOOD LEFT ARM  Result Value Ref Range Status   Specimen Description BLOOD LEFT ARM  Final   Special Requests   Final    BOTTLES DRAWN AEROBIC AND ANAEROBIC Blood Culture adequate volume   Culture   Final    NO GROWTH < 24 HOURS Performed at [facility], [place] [address], [place], [place]  [address]    Report Status PENDING  Incomplete    Coagulation Studies: No results for input(s): LABPROT, INR in the last 72 hours.  Urinalysis: No results for input(s): COLORURINE, LABSPEC, PHURINE, GLUCOSEU, HGBUR, BILIRUBINUR, KETONESUR, PROTEINUR, UROBILINOGEN, NITRITE, LEUKOCYTESUR in the last 72 hours.  Invalid input(s): APPERANCEUR    Imaging: No results found.   Medications:   . sodium chloride    . feeding supplement (NEPRO CARB STEADY)     . apixaban  2.5 mg Per Tube BID  . chlorhexidine gluconate (MEDLINE KIT)  15 mL Mouth Rinse BID  . Chlorhexidine Gluconate Cloth  6 each Topical Daily  . Chlorhexidine Gluconate Cloth  6 each Topical Q0600  . diltiazem  30 mg Per Tube Q8H  .  docusate  100 mg Per Tube BID  . feeding supplement (PRO-STAT SUGAR FREE 64)  30 mL Per Tube BID  . insulin aspart  0-9 Units Subcutaneous Q4H  . mouth rinse  15 mL Mouth Rinse 10 times per day  . pantoprazole (PROTONIX) IV  40 mg Intravenous Q24H  . polyethylene glycol  17 g Per Tube Daily  . vancomycin variable dose per unstable renal function (pharmacist dosing)   Does not apply See admin instructions   sodium chloride, acetaminophen (TYLENOL) oral liquid 160 mg/5 mL, alteplase, fentaNYL (SUBLIMAZE) injection, fentaNYL (SUBLIMAZE) injection, heparin, lidocaine (PF), lidocaine-prilocaine, pentafluoroprop-tetrafluoroeth  Assessment/ Plan:  1. Hemoptysis - in pt w/ trach at [facility], admitted here for bleeding from trach site on 5/19. Seems to be resolved.   Seems to be ulceration of stoma 2. Acute/chronic resp failure - sp COVID pna earlier this year, also course at [facility] complicated by multiple infections/ PNA. Sp trach. Vancomycin restarted. 02/21/2019 reculturing due to fever.  Enterococcus/MRSA positive for blood cultures.  Dialysis catheter removed 02/21/2020.    3. 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  Right IJ TDC removed.  Patient was receiving dialysis Monday  Wednesday Friday.  Received dialysis 02/21/2020 with removal of 1 L.  Currently line holiday next dialysis will be planned for Saturday, 02/24/2020 4. BP/ volume excess - 1 L removed 02/21/2020 5. AMS -mental status close to baseline 6. Atrial fib - per primary Eliquis and Cardizem for rate control 7. Anemia ckd / abl -status post transfusion 2 packs unit blood cells 8. Nutrition - on PEG feeds, +diverting colostomy 9. Sacral decub - per primary, WOC        LOS: [place] _0 _1 :30 AM

## 2020-02-22 NOTE — Progress Notes (Addendum)
[facility] for Infectious Disease    Date of Admission:  02/14/2020   Total days of antibiotics 3   ID: Shane Banks is a 74 y.o. 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  HD line pulled yesterday Medications:  . apixaban  2.5 mg Per Tube BID  . chlorhexidine gluconate (MEDLINE KIT)  15 mL Mouth Rinse BID  . Chlorhexidine Gluconate Cloth  6 each Topical Daily  . Chlorhexidine Gluconate Cloth  6 each Topical Q0600  . diltiazem  30 mg Per Tube Q8H  . docusate  100 mg Per Tube BID  . feeding supplement (PRO-STAT SUGAR FREE 64)  30 mL Per Tube BID  . insulin aspart  0-9 Units Subcutaneous Q4H  . mouth rinse  15 mL Mouth Rinse 10 times per day  . pantoprazole (PROTONIX) IV  40 mg Intravenous Q24H  . polyethylene glycol  17 g Per Tube Daily  . vancomycin variable dose per unstable renal function (pharmacist dosing)   Does not apply See admin instructions    Objective: Vital signs in last 24 hours: Temp:  [97.6 F (36.4 C)-101.6 F (38.7 C)] 97.7 F (36.5 C) (05/27 0752) Pulse Rate:  [52-115] 76 (05/27 0900) Resp:  [21-44] 30 (05/27 0900) BP: (80-116)/(48-72) 108/70 (05/27 0900) SpO2:  [93 %-100 %] 99 % (05/27 0900) FiO2 (%):  [30 %] 30 % (05/27 0723) Weight:  [57.7 kg] 57.7 kg (05/27 0500) Physical Exam  Constitutional: He is oriented to person, . He appears cachetic and chronically ill. No distress.  HENT:  Mouth/Throat: Oropharynx is clear and moist. No oropharyngeal exudate.  Cardiovascular: Normal rate, regular rhythm and normal heart sounds. Exam reveals no gallop and no friction rub.  No murmur heard.  Pulmonary/Chest: Effort normal and bilateral rhonchi Abdominal: Soft. Bowel sounds are normal. He exhibits no distension. There is  no tenderness. Peg and ostomy in place Back/buttock = large stage I4 with granualtion tissue some undermining, has palpable/visible bone noted Neurological: He is alert and oriented to person, place, and time.  Skin: Skin is warm and dry. No rash noted. No erythema.  Psychiatric: He has a normal mood and affect. His behavior is normal.     Lab Results Recent Labs    02/20/20 1455 02/20/20 1455 02/21/20 0252 02/21/20 0540 02/22/20 0839  WBC 22.1*   < > 18.9*  --  19.7*  HGB 9.8*   < > 9.4*  --  9.5*  HCT 30.8*   < > 29.5*  --  30.2*  NA 139  --   --  141  --   K 6.0*  --   --  5.6*  --   CL 98  --   --  100  --   CO2 22  --   --  23  --   BUN 76*  --   --  96*  --   CREATININE 3.20*  --   --  3.80*  --    < > = values in this interval not displayed.   Liver Panel Recent Labs    02/20/20 1455  ALBUMIN 2.6*    Microbiology: 5/22 staph epi  Blood cx 5/25 MR CoAg still pending, and enterococcus still pending - in 1 set of  blood cx Studies/Results: No results found.   Assessment/Plan: Intermittent bacteremia found to have MRSE(initially thought to be contaminant) and now has enterococcus = possible source include sacral wound/osteomyelitis and pneumonia and less likely HD line. Has probable pneumonia but also suspect that if he has not been treated for sacral osteomyelitis that this could be source - recommend to get tissue sacral biopsy (? 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

## 2020-02-22 NOTE — Progress Notes (Signed)
NAME:  Shane Banks, MRN:  [id number], DOB:  [birth year], LOS: 8 ADMISSION DATE:  02/14/2020, CONSULTATION DATE:  5/19 REFERRING MD:  Dr. Alvino Chapel, CHIEF COMPLAINT:  Hemoptysis   Brief History   73 year old male. COVID ARDS in January. Unfortunately required trach and LTACH placement. Had been tolerating trach collar, but on 5/19 he developed hemoptysis and hypoxemic respiratory failure requiring vent.   History of present illness   74 year old male with past medical history as below, which is significant for Atrial fibrillation and stroke. He was admitted to [facility] for [place] 19 pneumonia, which unfortunately progressed to ARDS.  It seems as though his highest vent settings were 60% FiO2 and 8 of PEEP.  He had very little improvement in his ventilatory and oxygenation use over the course of his hospitalization and ultimately underwent tracheostomy and was [facility] to [facility] in [place] on February 23. His course at [facility] was complicated by ongoing infections including UTI, MDRO pseudomonas pneumonia, and stenotrophomonas bacteremia. As a result of sepsis and shock he developed renal failure requiring dialysis, which he now receives M/W/F. In the morning hours oh 5/19 he developed hemoptysis. He had previously been taken off anticoagulation (for AF) due to anemia with positive hemoccult. Transferred to [facility] ED for evaluation of hemoptysis. Prior to the transfer he had been tolerating 28% ATC.   Past Medical History   has a past medical history of Acute on chronic respiratory failure with hypoxia ([place]), Atrial fibrillation with RVR ([place]), BPH (benign prostatic hyperplasia), COVID-19 virus infection, Pneumonia due to COVID-19 virus, Severe sepsis ([place]), and Stroke ([place]).   Significant Hospital Events   1/8  Admit to [facility] for COVID PNA 2/23 Transfer to [facility] on vent 5/19 Transfer to [facility] for hemoptysis. #6 cuffed trach placed. Back on  vent. Bronch performed 5/20 Transitioned to trach collar.  5/22 back on vent full time 5/24 back on antibiotics for fevers  Consults:  ENT nephro ID  Procedures:  ENT flex scope 5/19 > no bleeding source identified FOB 5/19 > no pulmonary bleeding source identified.   Significant Diagnostic Tests:  CXR 5/20 >> patchy bilateral infiltrates on left.   Micro Data:  BAL 5/19 > no growth 5/19 blood > NG 5/22 trach aspirate>> candida [place] 5/22 blood>> 1/4 GPC> staph epi 5/25 blood>> GPC (enterococcus on biofire) 2/4 bottles>> E. faecalis 5/25 trach aspirate>>many PMN, few GPR, rare GPC> diphtheroids 5/27 blood cx>>  Antimicrobials:  Levofloxacin 5/24>5/26 vanc 5/24> Ceftriaxone  5/27> Metronidazole 5/27>   Interim history/subjective:  HD line removed after dialysis yesterday. Denies complaints this morning.  Objective   Blood pressure 108/70, pulse 76, temperature 97.7 F (36.5 C), temperature source Axillary, resp. rate (!) 30, height _0  (1.753 m), weight 57.7 kg, SpO2 99 %.    Vent Mode: PRVC FiO2 (%):  [30 %] 30 % Set Rate:  [16 bmp-1616 bmp] 1616 bmp Vt Set:  [480 mL] 480 mL PEEP:  [5 cmH20] 5 cmH20 Plateau Pressure:  [17 cmH20-37 cmH20] 17 cmH20   Intake/Output Summary (Last 24 hours) at 02/22/2020 0946 Last data filed at 02/22/2020 0900 Gross per 24 hour  Intake 1102.24 ml  Output 1540 ml  Net -437.76 ml   Filed Weights   02/21/20 0500 02/21/20 0900 02/22/20 0500  Weight: 59.9 kg 59.3 kg 57.7 kg    Examination:   Gen:      Frail appearing man in NAD; appears stated age.  HEENT:  [place]/AT, temporal wasting Neck:     Trach in place without bleeding or secretions Lungs:    Tachypneic but without increased WOB on PS 15 + CPAP. CTAB. CV:         RRR Abd:      Thin, soft, NT, ND. Ostomy with pink stoma, PEG without erythema. Ext:    No clubbing or cyanosis. Muscle wasting. Skin:       Warm, dry, no rashes. Sacral wound not examined. Neuro:     Awake  and alert, globally weak, nods to answer questions   Resolved Hospital Problem list     Assessment & Plan:   Acute on chronic hypoxemic respiratory failure requiring tracheostomy, prolonged MV: he has a trach s/p COVID pneumonia earlier this year. His oxygenation had  improved to tolerate 28% ATC, but has been back on the vent for several days. Course since complicated by multiple pneumonias, which is seems he has complete tx for (MDRO pseudomonas treated with inhaled tobramycin). Now he has had hemoptysis and aspirated blood. Decompensated in ER in this setting. Hemoptysis seems to be originating from stoma. Ulceration?  Has a cuffed trach placed by ENT.  No active bleeding from the blood on bronchoscopy New LUL pneumonia -Con't full vent support. Restarting PS+ CPAP trials on PS 15- tachypneic but pulling appropriate volumes and no increased WOB or tachycardia. -con't to follow cultures -Con't vanc and ceftriaxone -VAP prevention protocol. 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  -IR consult for bone biopsy for culture to tailor antibiotics. 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 S/p pRBC transfusion. -con't to monitor -tranfuse for Hb <7 or hemodynamically significant bleeding  ESRD on HD  -dialysis per nephro- next on Saturday. -renally dose meds -strict I/os  Paroxysmal atrial fibrillation: sinus on telemetry today. Amiodarone stopped at [facility] due to bradycardia, anticoagulation stopped due to suspected GIB with several transfusions.  -con't tele monitoring -restart low-dose eliquis as he has been tolerating DVT prophylaxis  Chronic leukocytosis- stable. Prev culture positive for GPC thought to be contaminant- but wasn't positive for enterococcus on biofire. -con't to  monitor cultures -repeat blood cultures tomorrow  Cachexia, severe protein energy malnutrition -con't TF via PEG  Sacral decub; concern for sacral OM -WOC consult- appreciate their input -ceftriaxone & flagyl per ID -IR consult for bone biopsy for culture per surgery's request  Hyperglycemia- at goal -con't SSI PRN -accuchecks Q4h -goal BG 140-180 while admitted to ICU  Best practice:  Diet: TF Pain/Anxiety/Delirium protocol (if indicated): PRN sedation for vent complaince VAP protocol (if indicated): Per protocol DVT prophylaxis: eliquis low- dose GI prophylaxis: PPI Glucose control: SSI Mobility: BR Code Status: DNR Family Communication: Sister updated 5/26 Disposition: ICU   This patient is critically ill with multiple organ system failure which requires frequent high complexity decision making, assessment, support, evaluation, and titration of therapies. This was completed through the application of advanced monitoring technologies and extensive interpretation of multiple databases. During this encounter critical care time was devoted to patient care services described in this note for 51 minutes.   Julian Hy, DO 02/22/20 3:52 PM [facility]

## 2020-02-22 NOTE — Progress Notes (Signed)
  Echocardiogram 2D Echocardiogram has been performed.  Chelsea A Turrentine 02/22/2020, 8:59 AM

## 2020-02-22 NOTE — Progress Notes (Signed)
PROGRESS NOTE    Shane Banks  RCV:[id number] DOB: 08/01/1946 DOA: 02/14/2020 PCP: [name], MD   Brief Narrative:  HPI On 02/14/2020 by [name], NP (PCCM) 74 year old male with past medical history as below, which is significant for Atrial fibrillation and stroke. He was admitted to [facility] for [place] 19 pneumonia, which unfortunately progressed to ARDS.  It seems as though his highest vent settings were 60% FiO2 and 8 of PEEP.  He had very little improvement in his ventilatory and oxygenation use over the course of his hospitalization and ultimately underwent tracheostomy and was [facility] [place] on February 23. His course at [facility] was complicated by ongoing infections including UTI, MDRO pseudomonas pneumonia, and stenotrophomonas bacteremia. As a result of sepsis and shock he developed renal failure requiring dialysis, which he now receives M/W/F. In the morning hours oh 5/19 he developed hemoptysis. He had previously been taken off anticoagulation (for AF) due to anemia with positive hemoccult. Transferred to [facility] ED for evaluation of hemoptysis. Prior to the transfer he had been tolerating 28% ATC.  Assessment & Plan   Acute on chronic hypoxemic respiratory failure -Trach status post Covid infection earlier this year -Oxygenation had improved and patient was able to tolerate 28% ATC however now back on the vent for several days and has been intolerant of the weaning process -He has had multiple pneumonias of different organisms and has been treated with multiple antibiotics including Fortaz, Merrem, cefepime, Toprol mycin, vancomycin, azithromycin, ceftriaxone, tigecycline, avycaz, merrem, flagyl.  -Most recently pneumonia has been MDRO Pseudomonas for which she was treated with Avycaz and mycin [facility]bulizers [facility] -He likely has ongoing aspiration -Now with hemoptysis and aspirated blood.  Per critical care note also seems  to be originating from stoma.  Question ulceration.  Patient has cuffed trach placed by ENT.  No active bleeding on bronchoscopy.  Atrial fibrillation, paroxysmal -Wound discontinued due to bradycardia.  Anticoagulation discontinued due to suspected GI bleed with several transfusions. -Currently in sinus rhythm today  Sepsis, new left upper lobe pneumonia, Enterococcus bacteremia -Chest x-ray shows persistent bilateral lower lobe infiltrates with small bilateral pleural effusions -High suspicion of ongoing aspiration -Blood culture [facility] -Patient was started on Levaquin, but this was discontinued -Now on vancomycin with HD -Infectious disease consulted and appreciated-recommending sacral biopsy and adding on ceftriaxone and Flagyl later today -Remove catheter today after HD- planning for line holiday -Echocardiogram pending  End-stage renal disease -Nephrology consulted and appreciated -Continue hemodialysis -Currently patient has a line holiday, with next HD planned for 02/24/2020  Chronic anemia -Likely secondary to ESRD, critical illness -Status post blood transfusion -Continue to monitor  Sacral pressure ulcer -Unstageable -Wound care consulted -Neurosurgery consulted for tissue biopsy  Cachexia, severe protein malnutrition -Tube feeds via PEG  DVT Prophylaxis  SCDs  Code Status: DNR  Family Communication:   Disposition Plan:  Status is: Inpatient  Remains inpatient appropriate because:IV treatments appropriate due to intensity of illness or inability to take PO   Dispo: [facility]              Anticipated d/c is to: TBD              Anticipated d/c date is: > 3 days              Patient currently is not medically stable to d/c.   Consultants PCCM Nephrology ENT Infectious disease  Procedures  ENT Flex scope FOB  Antibiotics   Anti-infectives (From admission, onward)   Start     Dose/Rate Route Frequency Ordered Stop   02/22/20  2200  metroNIDAZOLE (FLAGYL) tablet 500 mg     500 mg Per Tube Every 8 hours 02/22/20 0957     02/22/20 1700  cefTRIAXone (ROCEPHIN) 2 g in sodium chloride 0.9 % 100 mL IVPB     2 g 200 mL/hr over 30 Minutes Intravenous Every 24 hours 02/22/20 0957     02/22/20 1200  vancomycin (VANCOCIN) IVPB 500 mg/100 ml premix  Status:  Discontinued     500 mg 100 mL/hr over 60 Minutes Intravenous Every T-Th-Sa (Hemodialysis) 02/20/20 1253 02/21/20 1040   02/21/20 1431  vancomycin variable dose per unstable renal function (pharmacist dosing)      Does not apply See admin instructions 02/21/20 1431     02/21/20 1200  vancomycin (VANCOREADY) IVPB 500 mg/100 mL     500 mg 100 mL/hr over 60 Minutes Intravenous  Once 02/21/20 0956 02/21/20 1253   02/21/20 0714  vancomycin (VANCOCIN) 500-5 MG/100ML-% IVPB  Status:  Discontinued    Note to Pharmacy: Ashley Akin   : cabinet override      02/21/20 0714 02/21/20 1346   02/20/20 1400  vancomycin (VANCOREADY) IVPB 1250 mg/250 mL     1,250 mg 166.7 mL/hr over 90 Minutes Intravenous  Once 02/20/20 1253 02/20/20 1538   02/20/20 1200  levofloxacin (LEVAQUIN) IVPB 500 mg  Status:  Discontinued     500 mg 100 mL/hr over 60 Minutes Intravenous Every 48 hours 02/20/20 1020 02/21/20 0943   02/15/20 1545  vancomycin (VANCOCIN) IVPB 1000 mg/200 mL premix  Status:  Discontinued     1,000 mg 200 mL/hr over 60 Minutes Intravenous Every 24 hours 02/14/20 1530 02/14/20 1745   02/15/20 0330  ceFEPIme (MAXIPIME) 2 g in sodium chloride 0.9 % 100 mL IVPB  Status:  Discontinued     2 g 200 mL/hr over 30 Minutes Intravenous Every 12 hours 02/14/20 1531 02/14/20 1531   02/14/20 1531  ceFEPIme (MAXIPIME) 2 g in sodium chloride 0.9 % 100 mL IVPB  Status:  Discontinued     2 g 200 mL/hr over 30 Minutes Intravenous Every 12 hours 02/14/20 1531 02/14/20 1745   02/14/20 1530  ceFEPIme (MAXIPIME) 2 g in sodium chloride 0.9 % 100 mL IVPB  Status:  Discontinued     2 g 200 mL/hr over 30  Minutes Intravenous  Once 02/14/20 1520 02/14/20 1531   02/14/20 1530  vancomycin (VANCOREADY) IVPB 1500 mg/300 mL  Status:  Discontinued     1,500 mg 150 mL/hr over 120 Minutes Intravenous  Once 02/14/20 1530 02/14/20 1745      Subjective:   Shane Banks seen and examined today.  Patient minimally verbal and interactive this morning.  Objective:   Vitals:   02/22/20 0800 02/22/20 0900 02/22/20 1000 02/22/20 1136  BP: 106/64 108/70 (!) 108/59   Pulse: 77 76 79   Resp: (!) 27 (!) 30 (!) 25   Temp:    99.2 F (37.3 C)  TempSrc:    Axillary  SpO2: 100% 99% 100%   Weight:      Height:        Intake/Output Summary (Last 24 hours) at 02/22/2020 1138 Last data filed at 02/22/2020 1000 Gross per 24 hour  Intake 1037.24 ml  Output 1540 ml  Net -502.76 ml   Filed Weights   02/21/20 0500  02/21/20 0900 02/22/20 0500  Weight: 59.9 kg 59.3 kg 57.7 kg   Exam  General: Well developed, chronically ill-appearing, NAD  HEENT: NCAT,mucous membranes moist.   Neck: Trach  Cardiovascular: S1 S2 auscultated, tachycardic  Respiratory: On vent, tachypneic  Abdomen: Soft, nontender, nondistended, + bowel sounds, PEG  Extremities: warm dry without cyanosis clubbing or edema  Neuro: Awake and alert  Psych: Cannot fully assess  Data Reviewed: I have personally reviewed following labs and imaging studies  CBC: Recent Labs  Lab 02/19/20 0557 02/20/20 0601 02/20/20 1455 02/21/20 0252 02/22/20 0839  WBC 17.4* 19.5* 22.1* 18.9* 19.7*  HGB 9.6* 10.2* 9.8* 9.4* 9.5*  HCT 30.2* 32.0* 30.8* 29.5* 30.2*  MCV 84.1 85.8 84.4 82.9 83.7  PLT 208 186 193 185 967   Basic Metabolic Panel: Recent Labs  Lab 02/15/20 2003 02/16/20 0449 02/16/20 0449 02/17/20 0343 02/18/20 0415 02/19/20 0557 02/20/20 0601 02/20/20 1455 02/21/20 0540 02/22/20 0839  NA  --  138   < > 138   < > 143 140 139 141 137  K  --  4.5   < > 3.7   < > 5.0 4.9 6.0* 5.6* 4.2  CL  --  101   < > 98   < > 100 100 98  100 95*  CO2  --  23   < > 26   < > _0 GLUCOSE  --  86   < > 164*   < > 154* 199* 208* 189* 182*  BUN  --  84*   < > 40*   < > 86* 64* 76* 96* 62*  CREATININE  --  3.00*   < > 1.97*   < > 3.75* 2.98* 3.20* 3.80* 2.61*  CALCIUM  --  9.6   < > 9.2   < > 9.9 9.6 9.8 9.9 10.0  MG  --  2.3  --  2.0  --   --   --   --   --   --   PHOS 4.6 5.8*  --  3.6  --   --   --  5.0*  --   --    < > = values in this interval not displayed.   GFR: Estimated Creatinine Clearance: 20.9 mL/min (A) (by C-G formula based on SCr of 2.61 mg/dL (H)). Liver Function Tests: Recent Labs  Lab 02/20/20 1455  ALBUMIN 2.6*   No results for input(s): LIPASE, AMYLASE in the last 168 hours. No results for input(s): AMMONIA in the last 168 hours. Coagulation Profile: No results for input(s): INR, PROTIME in the last 168 hours. Cardiac Enzymes: No results for input(s): CKTOTAL, CKMB, CKMBINDEX, TROPONINI in the last 168 hours. BNP (last 3 results) No results for input(s): PROBNP in the last 8760 hours. HbA1C: No results for input(s): HGBA1C in the last 72 hours. CBG: Recent Labs  Lab 02/21/20 1531 02/21/20 1937 02/21/20 2324 02/22/20 0318 02/22/20 0745  GLUCAP 184* 149* 186* 159* 177*   Lipid Profile: No results for input(s): CHOL, HDL, LDLCALC, TRIG, CHOLHDL, LDLDIRECT in the last 72 hours. Thyroid Function Tests: No results for input(s): TSH, T4TOTAL, FREET4, T3FREE, THYROIDAB in the last 72 hours. Anemia Panel: No results for input(s): VITAMINB12, FOLATE, FERRITIN, TIBC, IRON, RETICCTPCT in the last 72 hours. Urine analysis:    Component Value Date/Time   COLORURINE YELLOW 12/11/2019 1230   APPEARANCEUR HAZY (A) 12/11/2019 1230   LABSPEC 1.018 12/11/2019 1230   PHURINE 6.0 12/11/2019  [place] 12/11/2019 [place] 12/11/2019 [place] 12/11/2019 [place] 12/11/2019 1230   PROTEINUR 30 (A) 12/11/2019 1230   NITRITE NEGATIVE  12/11/2019 1230   LEUKOCYTESUR LARGE (A) 12/11/2019 1230   Sepsis Labs: _0 (procalcitonin:4,lacticidven:4)  ) Recent Results (from the past 240 hour(s))  Culture, blood (routine x 2)     Status: None   Collection Time: 02/14/20  2:55 PM   Specimen: BLOOD RIGHT WRIST  Result Value Ref Range Status   Specimen Description BLOOD RIGHT WRIST  Final   Special Requests   Final    BOTTLES DRAWN AEROBIC AND ANAEROBIC Blood Culture results may not be optimal due to an excessive volume of blood received in culture bottles   Culture   Final    NO GROWTH 5 DAYS Performed at [facility], [place] [address] [place], [place] [address]    Report Status 02/19/2020 FINAL  Final  SARS Coronavirus 2 by RT PCR (hospital order, performed in [facility] hospital lab) Nasopharyngeal Nasopharyngeal Swab     Status: None   Collection Time: 02/14/20  3:00 PM   Specimen: Nasopharyngeal Swab  Result Value Ref Range Status   SARS Coronavirus 2 NEGATIVE NEGATIVE Final    Comment: (NOTE) SARS-CoV-2 target nucleic acids are NOT DETECTED. The SARS-CoV-2 RNA is generally detectable in upper and lower respiratory specimens during the acute phase of infection. The lowest concentration of SARS-CoV-2 viral copies this assay can detect is 250 copies / mL. A negative result does not preclude SARS-CoV-2 infection and should not be used as the sole basis for treatment or other patient management decisions.  A negative result may occur with improper specimen collection / handling, submission of specimen other than nasopharyngeal swab, presence of viral mutation(s) within the areas targeted by this assay, and inadequate number of viral copies (<250 copies / mL). A negative result must be combined with clinical observations, patient history, and epidemiological information. Fact Sheet for Patients:   StrictlyIdeas.no Fact Sheet for Healthcare  Providers: BankingDealers.co.za This test is not yet approved or cleared  by the [place] FDA and has been authorized for detection and/or diagnosis of SARS-CoV-2 by FDA under an Emergency Use Authorization (EUA).  This EUA will remain in effect (meaning this test can be used) for the duration of the COVID-19 declaration under Section 564(b)(1) of the Act, 21 U.S.C. section 360bbb-3(b)(1), unless the authorization is terminated or revoked sooner. Performed at [facility], [place] [address], [place], [place] [address]   Culture, bal-quantitative     Status: None   Collection Time: 02/14/20  5:17 PM   Specimen: Bronchoalveolar Lavage; Respiratory  Result Value Ref Range Status   Specimen Description BRONCHIAL ALVEOLAR LAVAGE  Final   Special Requests Normal  Final   Gram Stain   Final    RARE WBC PRESENT,BOTH PMN AND MONONUCLEAR NO ORGANISMS SEEN    Culture   Final    NO GROWTH 2 DAYS Performed at [facility], [address] [place], [place] [address]    Report Status 02/17/2020 FINAL  Final  Culture, blood (routine x 2)     Status: None   Collection Time: 02/14/20  7:20 PM   Specimen: BLOOD  Result Value Ref Range Status   Specimen Description BLOOD RIGHT ANTECUBITAL  Final   Special Requests   Final    BOTTLES DRAWN AEROBIC AND ANAEROBIC Blood Culture adequate volume  Culture   Final    NO GROWTH 5 DAYS Performed at [facility], [place] [address], [place], [place] [address]    Report Status 02/19/2020 FINAL  Final  MRSA PCR Screening     Status: None   Collection Time: 02/16/20  7:30 PM   Specimen: Nasal Mucosa; Nasopharyngeal  Result Value Ref Range Status   MRSA by PCR NEGATIVE NEGATIVE Final    Comment:        The GeneXpert MRSA Assay (FDA approved for NASAL specimens only), is one component of a comprehensive MRSA colonization surveillance program. It is not intended to diagnose MRSA infection nor to guide or monitor  treatment for MRSA infections. Performed at [facility], [place] [address], [place], [place] [address]   Culture, blood (Routine X 2) w Reflex to ID Panel     Status: Abnormal (Preliminary result)   Collection Time: 02/17/20  6:26 AM   Specimen: BLOOD RIGHT WRIST  Result Value Ref Range Status   Specimen Description BLOOD RIGHT WRIST  Final   Special Requests AEROBIC BOTTLE ONLY Blood Culture adequate volume  Final   Culture  Setup Time   Final    GRAM POSITIVE COCCI IN CLUSTERS AEROBIC BOTTLE ONLY CRITICAL RESULT CALLED TO, READ BACK BY AND VERIFIED WITH: K. HURTH,PHARMD [id number] 02/19/2020 T. TYSOR    Culture (A)  Final    STAPHYLOCOCCUS EPIDERMIDIS THE SIGNIFICANCE OF ISOLATING THIS ORGANISM FROM A SINGLE SET OF BLOOD CULTURES WHEN MULTIPLE SETS ARE DRAWN IS UNCERTAIN. PLEASE NOTIFY THE MICROBIOLOGY DEPARTMENT WITHIN ONE WEEK IF SPECIATION AND SENSITIVITIES ARE REQUIRED. Performed at [facility], [place] [address], [place], [place] [address]    Report Status PENDING  Incomplete  Culture, blood (Routine X 2) w Reflex to ID Panel     Status: None   Collection Time: 02/17/20  6:26 AM   Specimen: BLOOD RIGHT HAND  Result Value Ref Range Status   Specimen Description BLOOD RIGHT HAND  Final   Special Requests AEROBIC BOTTLE ONLY Blood Culture adequate volume  Final   Culture   Final    NO GROWTH 5 DAYS Performed at [facility], [place] [address] [place], [place] [address]    Report Status 02/22/2020 FINAL  Final  Blood Culture ID Panel (Reflexed)     Status: Abnormal   Collection Time: 02/17/20  6:26 AM  Result Value Ref Range Status   Enterococcus species NOT DETECTED NOT DETECTED Final   Listeria monocytogenes NOT DETECTED NOT DETECTED Final   Staphylococcus species DETECTED (A) NOT DETECTED Final    Comment: Methicillin (oxacillin) resistant coagulase negative staphylococcus. Possible blood culture contaminant (unless isolated from more than one blood culture draw or  clinical case suggests pathogenicity). No antibiotic treatment is indicated for blood  culture contaminants. CRITICAL RESULT CALLED TO, READ BACK BY AND VERIFIED WITH: K. HURTH,PHARMD [id number] 02/19/2020 T. TYSOR    Staphylococcus aureus (BCID) NOT DETECTED NOT DETECTED Final   Methicillin resistance DETECTED (A) NOT DETECTED Final    Comment: CRITICAL RESULT CALLED TO, READ BACK BY AND VERIFIED WITH: K. HURTH,PHARMD [id number] 02/19/2020 T. TYSOR    Streptococcus species NOT DETECTED NOT DETECTED Final   Streptococcus agalactiae NOT DETECTED NOT DETECTED Final   Streptococcus pneumoniae NOT DETECTED NOT DETECTED Final   Streptococcus pyogenes NOT DETECTED NOT DETECTED Final   Acinetobacter baumannii NOT DETECTED NOT DETECTED Final   Enterobacteriaceae species NOT DETECTED NOT DETECTED Final   Enterobacter cloacae complex  NOT DETECTED NOT DETECTED Final   Escherichia coli NOT DETECTED NOT DETECTED Final   Klebsiella oxytoca NOT DETECTED NOT DETECTED Final   Klebsiella pneumoniae NOT DETECTED NOT DETECTED Final   Proteus species NOT DETECTED NOT DETECTED Final   Serratia marcescens NOT DETECTED NOT DETECTED Final   Haemophilus influenzae NOT DETECTED NOT DETECTED Final   Neisseria meningitidis NOT DETECTED NOT DETECTED Final   Pseudomonas aeruginosa NOT DETECTED NOT DETECTED Final   Candida albicans NOT DETECTED NOT DETECTED Final   Candida glabrata NOT DETECTED NOT DETECTED Final   Candida krusei NOT DETECTED NOT DETECTED Final   Candida parapsilosis NOT DETECTED NOT DETECTED Final   Candida tropicalis NOT DETECTED NOT DETECTED Final    Comment: Performed at [facility], [place] [address], [place], [place] [address]  Culture, respiratory (non-expectorated)     Status: None   Collection Time: 02/17/20  7:30 AM   Specimen: Tracheal Aspirate; Respiratory  Result Value Ref Range Status   Specimen Description TRACHEAL ASPIRATE  Final   Special Requests Normal  Final   Gram Stain   Final     RARE WBC PRESENT, PREDOMINANTLY PMN NO ORGANISMS SEEN Performed at [facility], [place] [address], [place], [place] [address]    Culture RARE CANDIDA PARAPSILOSIS  Final   Report Status 02/20/2020 FINAL  Final  Culture, respiratory (non-expectorated)     Status: None (Preliminary result)   Collection Time: 02/20/20 12:38 PM   Specimen: Tracheal Aspirate; Respiratory  Result Value Ref Range Status   Specimen Description TRACHEAL ASPIRATE  Final   Special Requests NONE  Final   Gram Stain   Final    RARE WBC PRESENT, PREDOMINANTLY PMN RARE SQUAMOUS EPITHELIAL CELLS PRESENT FEW GRAM POSITIVE RODS RARE GRAM POSITIVE COCCI    Culture   Final    CULTURE REINCUBATED FOR BETTER GROWTH Performed at [facility], [place] [address], [place], [place] [address]    Report Status PENDING  Incomplete  Culture, blood (routine x 2)     Status: None (Preliminary result)   Collection Time: 02/20/20 12:50 PM   Specimen: BLOOD LEFT HAND  Result Value Ref Range Status   Specimen Description BLOOD LEFT HAND  Final   Special Requests   Final    BOTTLES DRAWN AEROBIC AND ANAEROBIC Blood Culture adequate volume   Culture  Setup Time   Final    GRAM POSITIVE COCCI IN CHAINS IN BOTH AEROBIC AND ANAEROBIC BOTTLES CRITICAL RESULT CALLED TO, READ BACK BY AND VERIFIED WITH: Jens Som AMEND [id number] FCP Performed at [facility], [place] [address] [place], [place] [address]    Culture [facility] POSITIVE COCCI  Final   Report Status PENDING  Incomplete  Blood Culture ID Panel (Reflexed)     Status: Abnormal   Collection Time: 02/20/20 12:50 PM  Result Value Ref Range Status   Enterococcus species DETECTED (A) NOT DETECTED Final    Comment: CRITICAL RESULT CALLED TO, READ BACK BY AND VERIFIED WITH: PHARMD KAREN AMEND [phone] [id number] FCP    Vancomycin resistance NOT DETECTED NOT DETECTED Final   Listeria monocytogenes NOT DETECTED NOT DETECTED Final   Staphylococcus species DETECTED (A) NOT  DETECTED Final    Comment: Methicillin (oxacillin) resistant coagulase negative staphylococcus. Possible blood culture contaminant (unless isolated from more than one blood culture draw or clinical case suggests pathogenicity). No antibiotic treatment is indicated for blood  culture contaminants. CRITICAL RESULT CALLED TO, READ BACK BY  AND VERIFIED WITH: PHARMD KAREN AMEND [id number] FCP    Staphylococcus aureus (BCID) NOT DETECTED NOT DETECTED Final   Methicillin resistance DETECTED (A) NOT DETECTED Final    Comment: CRITICAL RESULT CALLED TO, READ BACK BY AND VERIFIED WITH: PHARMD KAREN AMEND [id number] FCP    Streptococcus species NOT DETECTED NOT DETECTED Final   Streptococcus agalactiae NOT DETECTED NOT DETECTED Final   Streptococcus pneumoniae NOT DETECTED NOT DETECTED Final   Streptococcus pyogenes NOT DETECTED NOT DETECTED Final   Acinetobacter baumannii NOT DETECTED NOT DETECTED Final   Enterobacteriaceae species NOT DETECTED NOT DETECTED Final   Enterobacter cloacae complex NOT DETECTED NOT DETECTED Final   Escherichia coli NOT DETECTED NOT DETECTED Final   Klebsiella oxytoca NOT DETECTED NOT DETECTED Final   Klebsiella pneumoniae NOT DETECTED NOT DETECTED Final   Proteus species NOT DETECTED NOT DETECTED Final   Serratia marcescens NOT DETECTED NOT DETECTED Final   Haemophilus influenzae NOT DETECTED NOT DETECTED Final   Neisseria meningitidis NOT DETECTED NOT DETECTED Final   Pseudomonas aeruginosa NOT DETECTED NOT DETECTED Final   Candida albicans NOT DETECTED NOT DETECTED Final   Candida glabrata NOT DETECTED NOT DETECTED Final   Candida krusei NOT DETECTED NOT DETECTED Final   Candida parapsilosis NOT DETECTED NOT DETECTED Final   Candida tropicalis NOT DETECTED NOT DETECTED Final    Comment: Performed at [facility], [place] [address] [place], [place] [address]  Culture, blood (routine x 2)     Status: None (Preliminary result)   Collection Time: 02/20/20 12:58  PM   Specimen: BLOOD LEFT ARM  Result Value Ref Range Status   Specimen Description BLOOD LEFT ARM  Final   Special Requests   Final    BOTTLES DRAWN AEROBIC AND ANAEROBIC Blood Culture adequate volume   Culture   Final    NO GROWTH 2 DAYS Performed at [facility], [place] [address], [place], [place] [address]    Report Status PENDING  Incomplete      Radiology Studies: No results found.   Scheduled Meds: . apixaban  2.5 mg Per Tube BID  . chlorhexidine gluconate (MEDLINE KIT)  15 mL Mouth Rinse BID  . Chlorhexidine Gluconate Cloth  6 each Topical Daily  . Chlorhexidine Gluconate Cloth  6 each Topical Q0600  . diltiazem  30 mg Per Tube Q8H  . docusate  100 mg Per Tube BID  . feeding supplement (PRO-STAT SUGAR FREE 64)  30 mL Per Tube BID  . insulin aspart  0-9 Units Subcutaneous Q4H  . mouth rinse  15 mL Mouth Rinse 10 times per day  . metroNIDAZOLE  500 mg Per Tube Q8H  . pantoprazole (PROTONIX) IV  40 mg Intravenous Q24H  . polyethylene glycol  17 g Per Tube Daily  . vancomycin variable dose per unstable renal function (pharmacist dosing)   Does not apply See admin instructions   Continuous Infusions: . sodium chloride    . cefTRIAXone (ROCEPHIN)  IV    . feeding supplement (NEPRO CARB STEADY) 1,000 mL (02/22/20 0700)     LOS: 8 days   Time Spent in minutes   45 minutes  Lilie Vezina D.O. on 02/22/2020 at 11:38 AM  Between 7am to 7pm - Please see pager noted on [email or web address]  After 7pm go to [email or web address]  And look for the night coverage person covering for me after hours  [facility] Office  [phone]

## 2020-02-23 ENCOUNTER — Inpatient Hospital Stay (HOSPITAL_COMMUNITY): Payer: Medicare HMO

## 2020-02-23 DIAGNOSIS — L89154 Pressure ulcer of sacral region, stage 4: Secondary | ICD-10-CM | POA: Diagnosis present

## 2020-02-23 DIAGNOSIS — J9622 Acute and chronic respiratory failure with hypercapnia: Secondary | ICD-10-CM

## 2020-02-23 DIAGNOSIS — R509 Fever, unspecified: Secondary | ICD-10-CM

## 2020-02-23 DIAGNOSIS — B952 Enterococcus as the cause of diseases classified elsewhere: Secondary | ICD-10-CM

## 2020-02-23 LAB — GLUCOSE, CAPILLARY
Glucose-Capillary: 117 mg/dL — ABNORMAL HIGH (ref 70–99)
Glucose-Capillary: 141 mg/dL — ABNORMAL HIGH (ref 70–99)
Glucose-Capillary: 144 mg/dL — ABNORMAL HIGH (ref 70–99)
Glucose-Capillary: 116 mg/dL — ABNORMAL HIGH (ref 70–99)
Glucose-Capillary: 126 mg/dL — ABNORMAL HIGH (ref 70–99)
Glucose-Capillary: 144 mg/dL — ABNORMAL HIGH (ref 70–99)

## 2020-02-23 LAB — BASIC METABOLIC PANEL
Anion gap: 18 — ABNORMAL HIGH (ref 5–15)
BUN: 91 mg/dL — ABNORMAL HIGH (ref 8–23)
CO2: 22 mmol/L (ref 22–32)
Calcium: 10.1 mg/dL (ref 8.9–10.3)
Chloride: 99 mmol/L (ref 98–111)
Creatinine, Ser: 3.34 mg/dL — ABNORMAL HIGH (ref 0.61–1.24)
GFR calc Af Amer: 20 mL/min — ABNORMAL LOW (ref >60)
GFR calc non Af Amer: 17 mL/min — ABNORMAL LOW (ref >60)
Glucose, Bld: 142 mg/dL — ABNORMAL HIGH (ref 70–99)
Potassium: 4.2 mmol/L (ref 3.5–5.1)
Sodium: 139 mmol/L (ref 135–145)

## 2020-02-23 LAB — CBC
HCT: 29.2 % — ABNORMAL LOW (ref 39.0–52.0)
Hemoglobin: 9.3 g/dL — ABNORMAL LOW (ref 13.0–17.0)
MCH: 26.5 pg (ref 26.0–34.0)
MCHC: 31.8 g/dL (ref 30.0–36.0)
MCV: 83.2 fL (ref 80.0–100.0)
Platelets: 217 10*3/uL (ref 150–400)
RBC: 3.51 MIL/uL — ABNORMAL LOW (ref 4.22–5.81)
RDW: 17.7 % — ABNORMAL HIGH (ref 11.5–15.5)
WBC: 16.2 10*3/uL — ABNORMAL HIGH (ref 4.0–10.5)
nRBC: 0 % (ref 0.0–0.2)

## 2020-02-23 MED ORDER — HEPARIN SODIUM (PORCINE) 1000 UNIT/ML DIALYSIS
1000.0000 [IU] | INTRAMUSCULAR | Status: DC | PRN
  Administered 2020-02-23: 1200 [IU] via INTRAVENOUS_CENTRAL
  Administered 2020-02-25: 2400 [IU] via INTRAVENOUS_CENTRAL
  Administered 2020-04-01 (×2): 1000 [IU] via INTRAVENOUS_CENTRAL
  Filled 2020-02-23 (×11): qty 6

## 2020-02-23 MED ORDER — CHLORHEXIDINE GLUCONATE CLOTH 2 % EX PADS: 6.0000 | MEDICATED_PAD | Freq: Every day | CUTANEOUS | Status: DC

## 2020-02-23 NOTE — Progress Notes (Signed)
NAME:  Shane Banks, MRN:  [id number], DOB:  May 28, 1946, LOS: 9 ADMISSION DATE:  02/14/2020, CONSULTATION DATE:  5/19 REFERRING MD:  Dr. Alvino Chapel, CHIEF COMPLAINT:  Hemoptysis   Brief History   74 year old male. COVID ARDS in January. Unfortunately required trach and LTACH placement. Had been tolerating trach collar, but on 5/19 he developed hemoptysis and hypoxemic respiratory failure requiring vent.   History of present illness   74 year old male with past medical history as below, which is significant for Atrial fibrillation and stroke. He was admitted to [facility] for [place] 19 pneumonia, which unfortunately progressed to ARDS.  It seems as though his highest vent settings were 60% FiO2 and 8 of PEEP.  He had very little improvement in his ventilatory and oxygenation use over the course of his hospitalization and ultimately underwent tracheostomy and was [facility] to [facility] in [place] on February 23. His course at [facility] was complicated by ongoing infections including UTI, MDRO pseudomonas pneumonia, and stenotrophomonas bacteremia. As a result of sepsis and shock he developed renal failure requiring dialysis, which he now receives M/W/F. In the morning hours oh 5/19 he developed hemoptysis. He had previously been taken off anticoagulation (for AF) due to anemia with positive hemoccult. Transferred to [facility] ED for evaluation of hemoptysis. Prior to the transfer he had been tolerating 28% ATC.   Past Medical History   has a past medical history of Acute on chronic respiratory failure with hypoxia ([place]), Atrial fibrillation with RVR ([place]), BPH (benign prostatic hyperplasia), COVID-19 virus infection, Pneumonia due to COVID-19 virus, Severe sepsis ([place]), and Stroke ([place]).   Significant Hospital Events   1/8  Admit to [facility] for COVID PNA 2/23 Transfer to [facility] on vent 5/19 Transfer to [facility] for hemoptysis. #6 cuffed trach placed. Back on  vent. Bronch performed 5/20 Transitioned to trach collar.  5/22 back on vent full time 5/24 back on antibiotics for fevers  Consults:  ENT nephro ID  Procedures:  ENT flex scope 5/19 > no bleeding source identified FOB 5/19 > no pulmonary bleeding source identified.   Significant Diagnostic Tests:  CXR 5/20 >> patchy bilateral infiltrates on left.   Micro Data:  BAL 5/19 > no growth 5/19 blood > NG 5/22 trach aspirate>> candida paras[place] 5/22 blood>> 1/4 GPC> staph epi 5/25 blood>> GPC (enterococcus on biofire) 2/4 bottles>> E. Faecalis, staph epi. 5/25 trach aspirate>>many PMN, few GPR, rare GPC> diphtheroids 5/27 blood cx>>  Antimicrobials:  Levofloxacin 5/24>5/26 vanc 5/25> Ceftriaxone  5/27> Metronidazole 5/27>   Interim history/subjective:  Denies complaints. MRI sacrum today, but no bone biopsy yet.  Objective   Blood pressure 120/72, pulse (!) 105, temperature 97.9 F (36.6 C), temperature source Oral, resp. rate (!) 37, height _0  (1.753 m), weight 57.7 kg, SpO2 100 %.    Vent Mode: PRVC FiO2 (%):  [30 %] 30 % Set Rate:  [16 bmp] 16 bmp Vt Set:  [480 mL] 480 mL PEEP:  [5 cmH20] 5 cmH20 Pressure Support:  [15 cmH20] 15 cmH20 Plateau Pressure:  [18 cmH20-30 cmH20] 30 cmH20   Intake/Output Summary (Last 24 hours) at 02/23/2020 1413 Last data filed at 02/23/2020 0800 Gross per 24 hour  Intake 585 ml  Output 400 ml  Net 185 ml   Filed Weights   02/21/20 0500 02/21/20 0900 02/22/20 0500  Weight: 59.9 kg 59.3 kg 57.7 kg    Examination:   Gen:  Frail elderly man in NAD, appears stated age 48:  [place]/AT, eyes anicteric. Temporal wasting. Neck:     Trach in place without bleeding Lungs:    tachypneic on the vent, faint rhonchi bilaterally CV:         Regular rhythm, mild tachycardia Abd:      Thin, soft, NT, ND. PEG without erythema, ostomy pink. Ext:    Minimal muscle mass, no edema Skin:       Warm, dry Neuro:     Awake, globally weak, nodding  to answer questions   Resolved Hospital Problem list     Assessment & Plan:   Acute on chronic hypoxemic respiratory failure requiring tracheostomy, prolonged MV: he has a trach s/p COVID pneumonia earlier this year. His oxygenation had  improved to tolerate 28% ATC, but has been back on the vent for several days. Course since complicated by multiple pneumonias, which is seems he has complete tx for (MDRO pseudomonas treated with inhaled tobramycin). Now he has had hemoptysis and aspirated blood. Decompensated in ER in this setting. Hemoptysis seems to be originating from stoma. Ulceration?  Has a cuffed trach placed by ENT.  No active bleeding from the blood on bronchoscopy New LUL pneumonia- diphtheroids on cx.  -Con't full vent support. Restarting PS+ CPAP trials on PS 15- tachypneic but tolerated for only about 1 hour today -Con't vanc, flagyl,ceftriaxone -VAP prevention protocol. 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 S/p pRBC transfusion. -con't to monitor -tranfuse for Hb <7 or hemodynamically significant bleeding  ESRD on HD  -dialysis per nephro- next on Saturday. -renally dose meds -strict I/os -line replaced today -renal TF for hyperkalemia  Paroxysmal atrial fibrillation: sinus on telemetry today. Amiodarone stopped at [facility] due to bradycardia, anticoagulation stopped due to suspected GIB with several transfusions.  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 Minimal insulin needs. -con't SSI PRN -accuchecks Q4h -goal BG 140-180 while admitted to ICU  Best practice:  Diet: TF Pain/Anxiety/Delirium protocol (if indicated): PRN sedation for vent complaince VAP protocol (if indicated): Per protocol DVT prophylaxis: eliquis low- dose GI prophylaxis: PPI Glucose control: SSI Mobility: BR Code Status: DNR Family Communication: Sister updated 5/26 Disposition: ICU   This patient is critically ill with multiple organ system failure which requires frequent high complexity decision making, assessment, support, evaluation, and titration of therapies. This was completed through the application of advanced monitoring technologies and extensive interpretation of multiple databases. During this encounter critical care time was devoted to patient care services described in this note for 33 minutes.  Julian Hy, DO 02/23/20 7:15 PM [facility]

## 2020-02-23 NOTE — Progress Notes (Signed)
Pharmacy Antibiotic Note  Shane Banks is a 74 y.o. 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 Patient is ESRD w/HD MWF PTA. Line pulled 5/26 with pland for replacement today/tomorrow. Plan next dialysis tomorrow.   Plan: Vanc 500 every HD Monitor HD sessions Plan pre-HD level next week  Height: 5\' 9"  (175.3 cm) Weight: 57.7 kg (127 lb 3.3 oz) IBW/kg (Calculated) : 70.7  Temp (24hrs), Avg:99.1 F (37.3 C), Min:97.9 F (36.6 C), Max:100.6 F (38.1 C)  Recent Labs  Lab 02/20/20 0601 02/20/20 1455 02/21/20 0252 02/21/20 0540 02/22/20 0839 02/23/20 0258  WBC 19.5* 22.1* 18.9*  --  19.7* 16.2*  CREATININE 2.98* 3.20*  --  3.80* 2.61* 3.34*    Estimated Creatinine Clearance: 16.3 mL/min (A) (by C-G formula based on SCr of 3.34 mg/dL (H)).    Allergies  Allergen Reactions  . Aspirin Rash  . Cephalexin Other (See Comments)    dizziness  . Penicillins     Itching,swelling     [name], PharmD, BCPS, [place] [facility] Clinical Pharmacist Phone: [phone] Please check AMION for all [facility] numbers  02/23/2020 3:05 PM

## 2020-02-23 NOTE — Progress Notes (Signed)
PROGRESS NOTE    Shane Banks  KVQ:[id number] DOB: 1946-03-31 DOA: 02/14/2020 PCP: [name], MD   Brief Narrative:  HPI On 02/14/2020 by [name], NP (PCCM) 74 year old male with past medical history as below, which is significant for Atrial fibrillation and stroke. He was admitted to [facility] for [place] 19 pneumonia, which unfortunately progressed to ARDS.  It seems as though his highest vent settings were 60% FiO2 and 8 of PEEP.  He had very little improvement in his ventilatory and oxygenation use over the course of his hospitalization and ultimately underwent tracheostomy and was [facility] [place] on February 23. His course at [facility] was complicated by ongoing infections including UTI, MDRO pseudomonas pneumonia, and stenotrophomonas bacteremia. As a result of sepsis and shock he developed renal failure requiring dialysis, which he now receives M/W/F. In the morning hours oh 5/19 he developed hemoptysis. He had previously been taken off anticoagulation (for AF) due to anemia with positive hemoccult. Transferred to [facility] ED for evaluation of hemoptysis. Prior to the transfer he had been tolerating 28% ATC.  Assessment & Plan   Acute on chronic hypoxemic respiratory failure -Trach status post Covid infection earlier this year -Oxygenation had improved and patient was able to tolerate 28% ATC however now back on the vent for several days and has been intolerant of the weaning process -He has had multiple pneumonias of different organisms and has been treated with multiple antibiotics including Fortaz, Merrem, cefepime, Toprol mycin, vancomycin, azithromycin, ceftriaxone, tigecycline, avycaz, merrem, flagyl.  -Most recently pneumonia has been MDRO Pseudomonas for which she was treated with Avycaz and mycin [facility]bulizers [facility] -He likely has ongoing aspiration -Now with hemoptysis and aspirated blood.  Per critical care note also seems  to be originating from stoma.  Question ulceration.  Patient has cuffed trach placed by ENT.  No active bleeding on bronchoscopy.  Atrial fibrillation, paroxysmal -Wound discontinued due to bradycardia.  Anticoagulation discontinued due to suspected GI bleed with several transfusions. -Currently in sinus rhythm today  Sepsis, new left upper lobe pneumonia, Enterococcus bacteremia -Chest x-ray shows persistent bilateral lower lobe infiltrates with small bilateral pleural effusions -High suspicion of ongoing aspiration -Blood culture GPC -Patient was started on Levaquin, but this was discontinued -Now on vancomycin with HD -Infectious disease consulted and appreciated -Discussed biopsy with surgery, they recommended IR  -Interventional radiology consulted and appreciated- pending CT bone marrow biopsy and aspiration  -Echocardiogram EF 64-33%, grade 1 diastolic dysfunction  End-stage renal disease -Nephrology consulted and appreciated -Continue hemodialysis -Currently patient has a line holiday, with next HD planned for 02/24/2020  Chronic anemia -Likely secondary to ESRD, critical illness -Status post blood transfusion -Continue to monitor  Sacral pressure ulcer -Unstageable -Wound care consulted -Neurosurgery consulted for tissue biopsy  Cachexia, severe protein malnutrition -Tube feeds via PEG  DVT Prophylaxis  SCDs  Code Status: DNR  Family Communication: None at bedside  Disposition Plan:  Status is: Inpatient  Remains inpatient appropriate because:IV treatments appropriate due to intensity of illness or inability to take PO   Dispo: [facility]              Anticipated d/c is to: TBD              Anticipated d/c date is: > 3 days              Patient currently is not medically stable to d/c.  Consultants PCCM Nephrology ENT Infectious disease  Procedures  ENT Flex scope FOB  Antibiotics   Anti-infectives (From admission, onward)     Start     Dose/Rate Route Frequency Ordered Stop   02/22/20 2200  metroNIDAZOLE (FLAGYL) tablet 500 mg     500 mg Per Tube Every 8 hours 02/22/20 0957     02/22/20 1700  cefTRIAXone (ROCEPHIN) 2 g in sodium chloride 0.9 % 100 mL IVPB     2 g 200 mL/hr over 30 Minutes Intravenous Every 24 hours 02/22/20 0957     02/22/20 1200  vancomycin (VANCOCIN) IVPB 500 mg/100 ml premix  Status:  Discontinued     500 mg 100 mL/hr over 60 Minutes Intravenous Every T-Th-Sa (Hemodialysis) 02/20/20 1253 02/21/20 1040   02/21/20 1431  vancomycin variable dose per unstable renal function (pharmacist dosing)      Does not apply See admin instructions 02/21/20 1431     02/21/20 1200  vancomycin (VANCOREADY) IVPB 500 mg/100 mL     500 mg 100 mL/hr over 60 Minutes Intravenous  Once 02/21/20 0956 02/21/20 1253   02/21/20 0714  vancomycin (VANCOCIN) 500-5 MG/100ML-% IVPB  Status:  Discontinued    Note to Pharmacy: Ashley Akin   : cabinet override      02/21/20 0714 02/21/20 1346   02/20/20 1400  vancomycin (VANCOREADY) IVPB 1250 mg/250 mL     1,250 mg 166.7 mL/hr over 90 Minutes Intravenous  Once 02/20/20 1253 02/20/20 1538   02/20/20 1200  levofloxacin (LEVAQUIN) IVPB 500 mg  Status:  Discontinued     500 mg 100 mL/hr over 60 Minutes Intravenous Every 48 hours 02/20/20 1020 02/21/20 0943   02/15/20 1545  vancomycin (VANCOCIN) IVPB 1000 mg/200 mL premix  Status:  Discontinued     1,000 mg 200 mL/hr over 60 Minutes Intravenous Every 24 hours 02/14/20 1530 02/14/20 1745   02/15/20 0330  ceFEPIme (MAXIPIME) 2 g in sodium chloride 0.9 % 100 mL IVPB  Status:  Discontinued     2 g 200 mL/hr over 30 Minutes Intravenous Every 12 hours 02/14/20 1531 02/14/20 1531   02/14/20 1531  ceFEPIme (MAXIPIME) 2 g in sodium chloride 0.9 % 100 mL IVPB  Status:  Discontinued     2 g 200 mL/hr over 30 Minutes Intravenous Every 12 hours 02/14/20 1531 02/14/20 1745   02/14/20 1530  ceFEPIme (MAXIPIME) 2 g in sodium chloride 0.9  % 100 mL IVPB  Status:  Discontinued     2 g 200 mL/hr over 30 Minutes Intravenous  Once 02/14/20 1520 02/14/20 1531   02/14/20 1530  vancomycin (VANCOREADY) IVPB 1500 mg/300 mL  Status:  Discontinued     1,500 mg 150 mL/hr over 120 Minutes Intravenous  Once 02/14/20 1530 02/14/20 1745      Subjective:   Alicia Ackert seen and examined today.  Patient minimally verbal and interactive this morning. Has no complaints.   Objective:   Vitals:   02/23/20 0700 02/23/20 0800 02/23/20 0900 02/23/20 1000  BP: 111/61 110/65 116/83 119/64  Pulse: 79 77 98 78  Resp: (!) 24 (!) 43 (!) 37 (!) 31  Temp: 98.8 F (37.1 C)     TempSrc: Oral     SpO2: 100% 100% 98% 100%  Weight:      Height:        Intake/Output Summary (Last 24 hours) at 02/23/2020 1053 Last data filed at 02/23/2020 0800 Gross per 24 hour  Intake 585 ml  Output 400 ml  Net 185 ml   Filed Weights   02/21/20 0500 02/21/20 0900 02/22/20 0500  Weight: 59.9 kg 59.3 kg 57.7 kg   Exam  General: Well developed, chronically ill-appearing, NAD  HEENT: NCAT, mucous membranes moist.   Cardiovascular: S1 S2 auscultated, RRR  Respiratory: on vent  Abdomen: Soft, nontender, nondistended, + bowel sounds  Extremities: warm dry without cyanosis clubbing or edema  Neuro: Awake and alert, answers questions can follow commands, weak  Psych: Appropriate  Data Reviewed: I have personally reviewed following labs and imaging studies  CBC: Recent Labs  Lab 02/20/20 0601 02/20/20 1455 02/21/20 0252 02/22/20 0839 02/23/20 0258  WBC 19.5* 22.1* 18.9* 19.7* 16.2*  HGB 10.2* 9.8* 9.4* 9.5* 9.3*  HCT 32.0* 30.8* 29.5* 30.2* 29.2*  MCV 85.8 84.4 82.9 83.7 83.2  PLT 186 193 185 202 709   Basic Metabolic Panel: Recent Labs  Lab 02/17/20 0343 02/18/20 0415 02/20/20 0601 02/20/20 1455 02/21/20 0540 02/22/20 0839 02/23/20 0258  NA 138   < > 140 139 141 137 139  K 3.7   < > 4.9 6.0* 5.6* 4.2 4.2  CL 98   < > 100 98 100 95*  99  CO2 26   < > _0 GLUCOSE 164*   < > 199* 208* 189* 182* 142*  BUN 40*   < > 64* 76* 96* 62* 91*  CREATININE 1.97*   < > 2.98* 3.20* 3.80* 2.61* 3.34*  CALCIUM 9.2   < > 9.6 9.8 9.9 10.0 10.1  MG 2.0  --   --   --   --   --   --   PHOS 3.6  --   --  5.0*  --   --   --    < > = values in this interval not displayed.   GFR: Estimated Creatinine Clearance: 16.3 mL/min (A) (by C-G formula based on SCr of 3.34 mg/dL (H)). Liver Function Tests: Recent Labs  Lab 02/20/20 1455  ALBUMIN 2.6*   No results for input(s): LIPASE, AMYLASE in the last 168 hours. No results for input(s): AMMONIA in the last 168 hours. Coagulation Profile: No results for input(s): INR, PROTIME in the last 168 hours. Cardiac Enzymes: No results for input(s): CKTOTAL, CKMB, CKMBINDEX, TROPONINI in the last 168 hours. BNP (last 3 results) No results for input(s): PROBNP in the last 8760 hours. HbA1C: No results for input(s): HGBA1C in the last 72 hours. CBG: Recent Labs  Lab 02/22/20 1532 02/22/20 1931 02/22/20 2335 02/23/20 0340 02/23/20 0720  GLUCAP 167* 160* 176* 117* 141*   Lipid Profile: No results for input(s): CHOL, HDL, LDLCALC, TRIG, CHOLHDL, LDLDIRECT in the last 72 hours. Thyroid Function Tests: No results for input(s): TSH, T4TOTAL, FREET4, T3FREE, THYROIDAB in the last 72 hours. Anemia Panel: No results for input(s): VITAMINB12, FOLATE, FERRITIN, TIBC, IRON, RETICCTPCT in the last 72 hours. Urine analysis:    Component Value Date/Time   COLORURINE YELLOW 12/11/2019 1230   APPEARANCEUR HAZY (A) 12/11/2019 1230   LABSPEC 1.018 12/11/2019 1230   PHURINE 6.0 12/11/2019 1230   GLUCOSEU NEGATIVE 12/11/2019 1230   HGBUR NEGATIVE 12/11/2019 1230   BILIRUBINUR NEGATIVE 12/11/2019 1230   KETONESUR NEGATIVE 12/11/2019 1230   PROTEINUR 30 (A) 12/11/2019 1230   NITRITE NEGATIVE 12/11/2019 1230   LEUKOCYTESUR LARGE (A) 12/11/2019 1230   Sepsis  Labs: _1 (procalcitonin:4,lacticidven:4)  ) Recent Results (from the past 240 hour(s))  Culture, blood (routine x 2)     Status:  None   Collection Time: 02/14/20  2:55 PM   Specimen: BLOOD RIGHT WRIST  Result Value Ref Range Status   Specimen Description BLOOD RIGHT WRIST  Final   Special Requests   Final    BOTTLES DRAWN AEROBIC AND ANAEROBIC Blood Culture results may not be optimal due to an excessive volume of blood received in culture bottles   Culture   Final    NO GROWTH 5 DAYS Performed at [facility], [place] [address] [place], [place] [address]    Report Status 02/19/2020 FINAL  Final  SARS Coronavirus 2 by RT PCR (hospital order, performed in [facility] hospital lab) Nasopharyngeal Nasopharyngeal Swab     Status: None   Collection Time: 02/14/20  3:00 PM   Specimen: Nasopharyngeal Swab  Result Value Ref Range Status   SARS Coronavirus 2 NEGATIVE NEGATIVE Final    Comment: (NOTE) SARS-CoV-2 target nucleic acids are NOT DETECTED. The SARS-CoV-2 RNA is generally detectable in upper and lower respiratory specimens during the acute phase of infection. The lowest concentration of SARS-CoV-2 viral copies this assay can detect is 250 copies / mL. A negative result does not preclude SARS-CoV-2 infection and should not be used as the sole basis for treatment or other patient management decisions.  A negative result may occur with improper specimen collection / handling, submission of specimen other than nasopharyngeal swab, presence of viral mutation(s) within the areas targeted by this assay, and inadequate number of viral copies (<250 copies / mL). A negative result must be combined with clinical observations, patient history, and epidemiological information. Fact Sheet for Patients:   StrictlyIdeas.no Fact Sheet for Healthcare Providers: BankingDealers.co.za This test is not yet approved or cleared  by the [place] FDA and has been authorized for detection and/or diagnosis of SARS-CoV-2 by FDA under an Emergency Use Authorization (EUA).  This EUA will remain in effect (meaning this test can be used) for the duration of the COVID-19 declaration under Section 564(b)(1) of the Act, 21 U.S.C. section 360bbb-3(b)(1), unless the authorization is terminated or revoked sooner. Performed at [facility], [place] [address], [place], [place] [address]   Culture, bal-quantitative     Status: None   Collection Time: 02/14/20  5:17 PM   Specimen: Bronchoalveolar Lavage; Respiratory  Result Value Ref Range Status   Specimen Description BRONCHIAL ALVEOLAR LAVAGE  Final   Special Requests Normal  Final   Gram Stain   Final    RARE WBC PRESENT,BOTH PMN AND MONONUCLEAR NO ORGANISMS SEEN    Culture   Final    NO GROWTH 2 DAYS Performed at [facility], [address] [place], [place] [address]    Report Status 02/17/2020 FINAL  Final  Culture, blood (routine x 2)     Status: None   Collection Time: 02/14/20  7:20 PM   Specimen: BLOOD  Result Value Ref Range Status   Specimen Description BLOOD RIGHT ANTECUBITAL  Final   Special Requests   Final    BOTTLES DRAWN AEROBIC AND ANAEROBIC Blood Culture adequate volume   Culture   Final    NO GROWTH 5 DAYS Performed at [facility], [place] [address], [place],  [address]    Report Status 02/19/2020 FINAL  Final  MRSA PCR Screening     Status: None   Collection Time: 02/16/20  7:30 PM   Specimen: Nasal Mucosa; Nasopharyngeal  Result Value Ref Range Status   MRSA by PCR NEGATIVE  NEGATIVE Final    Comment:        The GeneXpert MRSA Assay (FDA approved for NASAL specimens only), is one component of a comprehensive MRSA colonization surveillance program. It is not intended to diagnose MRSA infection nor to guide or monitor treatment for MRSA infections. Performed at [facility], [place] [address], [place], [place] [address]    Culture, blood (Routine X 2) w Reflex to ID Panel     Status: Abnormal (Preliminary result)   Collection Time: 02/17/20  6:26 AM   Specimen: BLOOD RIGHT WRIST  Result Value Ref Range Status   Specimen Description BLOOD RIGHT WRIST  Final   Special Requests AEROBIC BOTTLE ONLY Blood Culture adequate volume  Final   Culture  Setup Time   Final    GRAM POSITIVE COCCI IN CLUSTERS AEROBIC BOTTLE ONLY CRITICAL RESULT CALLED TO, READ BACK BY AND VERIFIED WITH: Annett Gula 1505 02/19/2020 Mena Goes Performed at [facility], [place] [address] [place], [place] [address]    Culture STAPHYLOCOCCUS EPIDERMIDIS (A)  Final   Report Status PENDING  Incomplete  Culture, blood (Routine X 2) w Reflex to ID Panel     Status: None   Collection Time: 02/17/20  6:26 AM   Specimen: BLOOD RIGHT HAND  Result Value Ref Range Status   Specimen Description BLOOD RIGHT HAND  Final   Special Requests AEROBIC BOTTLE ONLY Blood Culture adequate volume  Final   Culture   Final    NO GROWTH 5 DAYS Performed at [facility], [place] [address], [place], [place] [address]    Report Status 02/22/2020 FINAL  Final  Blood Culture ID Panel (Reflexed)     Status: Abnormal   Collection Time: 02/17/20  6:26 AM  Result Value Ref Range Status   Enterococcus species NOT DETECTED NOT DETECTED Final   Listeria monocytogenes NOT DETECTED NOT DETECTED Final   Staphylococcus species DETECTED (A) NOT DETECTED Final    Comment: Methicillin (oxacillin) resistant coagulase negative staphylococcus. Possible blood culture contaminant (unless isolated from more than one blood culture draw or clinical case suggests pathogenicity). No antibiotic treatment is indicated for blood  culture contaminants. CRITICAL RESULT CALLED TO, READ BACK BY AND VERIFIED WITH: K. HURTH,PHARMD [id number] 02/19/2020 T. TYSOR    Staphylococcus aureus (BCID) NOT DETECTED NOT DETECTED Final   Methicillin resistance DETECTED (A) NOT DETECTED Final     Comment: CRITICAL RESULT CALLED TO, READ BACK BY AND VERIFIED WITH: K. HURTH,PHARMD [id number] 02/19/2020 T. TYSOR    Streptococcus species NOT DETECTED NOT DETECTED Final   Streptococcus agalactiae NOT DETECTED NOT DETECTED Final   Streptococcus pneumoniae NOT DETECTED NOT DETECTED Final   Streptococcus pyogenes NOT DETECTED NOT DETECTED Final   Acinetobacter baumannii NOT DETECTED NOT DETECTED Final   Enterobacteriaceae species NOT DETECTED NOT DETECTED Final   Enterobacter cloacae complex NOT DETECTED NOT DETECTED Final   Escherichia coli NOT DETECTED NOT DETECTED Final   Klebsiella oxytoca NOT DETECTED NOT DETECTED Final   Klebsiella pneumoniae NOT DETECTED NOT DETECTED Final   Proteus species NOT DETECTED NOT DETECTED Final   Serratia marcescens NOT DETECTED NOT DETECTED Final   Haemophilus influenzae NOT DETECTED NOT DETECTED Final   Neisseria meningitidis NOT DETECTED NOT DETECTED Final   Pseudomonas aeruginosa NOT DETECTED NOT DETECTED Final   Candida albicans NOT DETECTED NOT DETECTED Final   Candida glabrata NOT DETECTED NOT DETECTED Final   Candida krusei NOT DETECTED NOT DETECTED Final   Candida  parapsilosis NOT DETECTED NOT DETECTED Final   Candida tropicalis NOT DETECTED NOT DETECTED Final    Comment: Performed at [facility], [place] [address], [place], [place] [address]  Culture, respiratory (non-expectorated)     Status: None   Collection Time: 02/17/20  7:30 AM   Specimen: Tracheal Aspirate; Respiratory  Result Value Ref Range Status   Specimen Description TRACHEAL ASPIRATE  Final   Special Requests Normal  Final   Gram Stain   Final    RARE WBC PRESENT, PREDOMINANTLY PMN NO ORGANISMS SEEN Performed at [facility], [place] [address], [place], [place] [address]    Culture RARE CANDIDA PARAPSILOSIS  Final   Report Status 02/20/2020 FINAL  Final  Culture, respiratory (non-expectorated)     Status: None (Preliminary result)   Collection Time: 02/20/20 12:38 PM    Specimen: Tracheal Aspirate; Respiratory  Result Value Ref Range Status   Specimen Description TRACHEAL ASPIRATE  Final   Special Requests NONE  Final   Gram Stain   Final    RARE WBC PRESENT, PREDOMINANTLY PMN RARE SQUAMOUS EPITHELIAL CELLS PRESENT FEW GRAM POSITIVE RODS RARE GRAM POSITIVE COCCI    Culture   Final    ABUNDANT DIPHTHEROIDS(CORYNEBACTERIUM SPECIES) FEW YEAST CULTURE REINCUBATED FOR BETTER GROWTH Performed at [facility], [place] [address], [place], [place] [address]    Report Status PENDING  Incomplete  Culture, blood (routine x 2)     Status: Abnormal (Preliminary result)   Collection Time: 02/20/20 12:50 PM   Specimen: BLOOD LEFT HAND  Result Value Ref Range Status   Specimen Description BLOOD LEFT HAND  Final   Special Requests   Final    BOTTLES DRAWN AEROBIC AND ANAEROBIC Blood Culture adequate volume   Culture  Setup Time   Final    GRAM POSITIVE COCCI IN CHAINS IN BOTH AEROBIC AND ANAEROBIC BOTTLES CRITICAL RESULT CALLED TO, READ BACK BY AND VERIFIED WITH: PHARMD KAREN AMEND [id number] [id number] FCP    Culture (A)  Final    ENTEROCOCCUS FAECALIS STAPHYLOCOCCUS EPIDERMIDIS SUSCEPTIBILITIES TO FOLLOW Performed at [facility], [place] [address] [place], [place] [address]    Report Status PENDING  Incomplete   Organism ID, Bacteria ENTEROCOCCUS FAECALIS  Final      Susceptibility   Enterococcus faecalis - MIC*    AMPICILLIN <=2 SENSITIVE Sensitive     VANCOMYCIN 1 SENSITIVE Sensitive     GENTAMICIN SYNERGY RESISTANT Resistant     * ENTEROCOCCUS FAECALIS  Blood Culture ID Panel (Reflexed)     Status: Abnormal   Collection Time: 02/20/20 12:50 PM  Result Value Ref Range Status   Enterococcus species DETECTED (A) NOT DETECTED Final    Comment: CRITICAL RESULT CALLED TO, READ BACK BY AND VERIFIED WITH: PHARMD KAREN AMEND [id number] [id number] FCP    Vancomycin resistance NOT DETECTED NOT DETECTED Final   Listeria monocytogenes NOT DETECTED NOT DETECTED Final    Staphylococcus species DETECTED (A) NOT DETECTED Final    Comment: Methicillin (oxacillin) resistant coagulase negative staphylococcus. Possible blood culture contaminant (unless isolated from more than one blood culture draw or clinical case suggests pathogenicity). No antibiotic treatment is indicated for blood  culture contaminants. CRITICAL RESULT CALLED TO, READ BACK BY AND VERIFIED WITH: PHARMD KAREN AMEND [id number] [id number] FCP    Staphylococcus aureus (BCID) NOT DETECTED NOT DETECTED Final   Methicillin resistance DETECTED (A) NOT DETECTED Final    Comment: CRITICAL RESULT CALLED TO, READ BACK BY AND VERIFIED  WITH: PHARMD KAREN AMEND [phone] [id number] FCP    Streptococcus species NOT DETECTED NOT DETECTED Final   Streptococcus agalactiae NOT DETECTED NOT DETECTED Final   Streptococcus pneumoniae NOT DETECTED NOT DETECTED Final   Streptococcus pyogenes NOT DETECTED NOT DETECTED Final   Acinetobacter baumannii NOT DETECTED NOT DETECTED Final   Enterobacteriaceae species NOT DETECTED NOT DETECTED Final   Enterobacter cloacae complex NOT DETECTED NOT DETECTED Final   Escherichia coli NOT DETECTED NOT DETECTED Final   Klebsiella oxytoca NOT DETECTED NOT DETECTED Final   Klebsiella pneumoniae NOT DETECTED NOT DETECTED Final   Proteus species NOT DETECTED NOT DETECTED Final   Serratia marcescens NOT DETECTED NOT DETECTED Final   Haemophilus influenzae NOT DETECTED NOT DETECTED Final   Neisseria meningitidis NOT DETECTED NOT DETECTED Final   Pseudomonas aeruginosa NOT DETECTED NOT DETECTED Final   Candida albicans NOT DETECTED NOT DETECTED Final   Candida glabrata NOT DETECTED NOT DETECTED Final   Candida krusei NOT DETECTED NOT DETECTED Final   Candida parapsilosis NOT DETECTED NOT DETECTED Final   Candida tropicalis NOT DETECTED NOT DETECTED Final    Comment: Performed at [facility], [place] [address], [place], [place] [address]  Culture, blood (routine x 2)     Status: None (Preliminary  result)   Collection Time: 02/20/20 12:58 PM   Specimen: BLOOD LEFT ARM  Result Value Ref Range Status   Specimen Description BLOOD LEFT ARM  Final   Special Requests   Final    BOTTLES DRAWN AEROBIC AND ANAEROBIC Blood Culture adequate volume   Culture   Final    NO GROWTH 3 DAYS Performed at [facility], [place] [address] [place], [place] [address]    Report Status PENDING  Incomplete  Culture, blood (Routine X 2) w Reflex to ID Panel     Status: None (Preliminary result)   Collection Time: 02/22/20 11:25 AM   Specimen: BLOOD RIGHT HAND  Result Value Ref Range Status   Specimen Description BLOOD RIGHT HAND  Final   Special Requests   Final    BOTTLES DRAWN AEROBIC ONLY Blood Culture adequate volume   Culture   Final    NO GROWTH < 24 HOURS Performed at [facility], [place] [address], [place], [place] [address]    Report Status PENDING  Incomplete  Culture, blood (Routine X 2) w Reflex to ID Panel     Status: None (Preliminary result)   Collection Time: 02/22/20 11:25 AM   Specimen: BLOOD RIGHT HAND  Result Value Ref Range Status   Specimen Description BLOOD RIGHT HAND  Final   Special Requests   Final    BOTTLES DRAWN AEROBIC ONLY Blood Culture adequate volume   Culture   Final    NO GROWTH < 24 HOURS Performed at [facility], [place] [address], [place], [place] [address]    Report Status PENDING  Incomplete      Radiology Studies: ECHOCARDIOGRAM COMPLETE  Result Date: 02/22/2020    ECHOCARDIOGRAM REPORT   Patient Name:   Shane Banks Date of Exam: 02/22/2020 Medical Rec #:  [id number]    Height:       69.0 in Accession #:    [id number]   Weight:       127.2 lb Date of Birth:  1946-06-22    BSA:          1.705 m Patient Age:    55 years     BP:  104/63 mmHg Patient Gender: M            HR:           77 bpm. Exam Location:  Inpatient Procedure: 2D Echo Indications:    Bacteremia 790.7 / R78.81  History:        Patient has prior history of Echocardiogram  examinations, most                 recent 12/19/2019. Arrythmias:Atrial Fibrillation. Sepsis.  Sonographer:    Vikki Ports Turrentine Referring Phys: [id number] [facility] Cheree Fowles  Sonographer Comments: Echo performed with patient supine and on artificial respirator and no subcostal window. No SSN. Tracheostomy and peg tube in place. IMPRESSIONS  1. Left ventricular ejection fraction, by estimation, is 60 to 65%. The left ventricle has normal function. The left ventricle has no regional wall motion abnormalities. There is severe left ventricular hypertrophy. Left ventricular diastolic parameters  are consistent with Grade I diastolic dysfunction (impaired relaxation).  2. Right ventricular systolic function is normal. The right ventricular size is normal. There is mildly elevated pulmonary artery systolic pressure.  3. Left atrial size was mildly dilated.  4. Right atrial size was mildly dilated.  5. The mitral valve is grossly normal. Trivial mitral valve regurgitation.  6. The aortic valve is tricuspid. Aortic valve regurgitation is trivial. Conclusion(s)/Recommendation(s): No evidence of valvular vegetations on this transthoracic echocardiogram. Would recommend a transesophageal echocardiogram to exclude infective endocarditis if clinically indicated. FINDINGS  Left Ventricle: Left ventricular ejection fraction, by estimation, is 60 to 65%. The left ventricle has normal function. The left ventricle has no regional wall motion abnormalities. The left ventricular internal cavity size was normal in size. There is  severe left ventricular hypertrophy. Left ventricular diastolic parameters are consistent with Grade I diastolic dysfunction (impaired relaxation). Indeterminate filling pressures. Right Ventricle: The right ventricular size is normal. No increase in right ventricular wall thickness. Right ventricular systolic function is normal. There is mildly elevated pulmonary artery systolic pressure. The tricuspid regurgitant  velocity is 3.00  m/s, and with an assumed right atrial pressure of 8 mmHg, the estimated right ventricular systolic pressure is 83.4 mmHg. Left Atrium: Left atrial size was mildly dilated. Right Atrium: Right atrial size was mildly dilated. Pericardium: Trivial pericardial effusion is present. The pericardial effusion is posterior to the left ventricle. Mitral Valve: The mitral valve is grossly normal. Trivial mitral valve regurgitation. Tricuspid Valve: The tricuspid valve is grossly normal. Tricuspid valve regurgitation is mild. Aortic Valve: The aortic valve is tricuspid. Aortic valve regurgitation is trivial. Pulmonic Valve: The pulmonic valve was grossly normal. Pulmonic valve regurgitation is trivial. Aorta: The aortic root and ascending aorta are structurally normal, with no evidence of dilitation. IAS/Shunts: No atrial level shunt detected by color flow Doppler.  LEFT VENTRICLE PLAX 2D LVIDd:         3.40 cm  Diastology LVIDs:         2.30 cm  LV e' lateral:   6.83 cm/s LV PW:         1.40 cm  LV E/e' lateral: 13.4 LV IVS:        1.40 cm  LV e' medial:    5.93 cm/s LVOT diam:     2.10 cm  LV E/e' medial:  15.4 LV SV:         56 LV SV Index:   33 LVOT Area:     3.46 cm  RIGHT VENTRICLE RV S prime:     11.10  cm/s TAPSE (M-mode): 1.4 cm LEFT ATRIUM           Index       RIGHT ATRIUM           Index LA diam:      3.50 cm 2.05 cm/m  RA Area:     20.80 cm LA Vol (A2C): 64.3 ml 37.72 ml/m RA Volume:   59.20 ml  34.73 ml/m LA Vol (A4C): 32.1 ml 18.83 ml/m  AORTIC VALVE LVOT Vmax:   102.00 cm/s LVOT Vmean:  72.600 cm/s LVOT VTI:    0.163 m  AORTA Ao Root diam: 4.00 cm MITRAL VALVE               TRICUSPID VALVE MV Area (PHT): 4.63 cm    TR Peak grad:   36.0 mmHg MV Decel Time: 164 msec    TR Vmax:        300.00 cm/s MV E velocity: 91.30 cm/s MV A velocity: 67.70 cm/s  SHUNTS MV E/A ratio:  1.35        Systemic VTI:  0.16 m                            Systemic Diam: 2.10 cm Lyman Bishop MD Electronically signed  by Lyman Bishop MD Signature Date/Time: 02/22/2020/12:03:41 PM    Final      Scheduled Meds: . chlorhexidine gluconate (MEDLINE KIT)  15 mL Mouth Rinse BID  . Chlorhexidine Gluconate Cloth  6 each Topical Q0600  . diltiazem  30 mg Per Tube Q8H  . docusate  100 mg Per Tube BID  . feeding supplement (PRO-STAT SUGAR FREE 64)  30 mL Per Tube BID  . insulin aspart  0-9 Units Subcutaneous Q4H  . mouth rinse  15 mL Mouth Rinse 10 times per day  . metroNIDAZOLE  500 mg Per Tube Q8H  . pantoprazole (PROTONIX) IV  40 mg Intravenous Q24H  . polyethylene glycol  17 g Per Tube Daily  . vancomycin variable dose per unstable renal function (pharmacist dosing)   Does not apply See admin instructions   Continuous Infusions: . sodium chloride    . cefTRIAXone (ROCEPHIN)  IV 2 g (02/22/20 1736)  . feeding supplement (NEPRO CARB STEADY) Stopped (02/23/20 0000)     LOS: 9 days   Time Spent in minutes   45 minutes  Brittiany Wiehe D.O. on 02/23/2020 at 10:53 AM  Between 7am to 7pm - Please see pager noted on [email or web address]  After 7pm go to [email or web address]  And look for the night coverage person covering for me after hours  [facility] Office  [phone]

## 2020-02-23 NOTE — Procedures (Signed)
Central Venous Catheter Insertion Procedure Note Shane Banks [id number] 1946/09/26  Procedure: Insertion of Central Venous Catheter Indications: HD  Procedure Details Consent: Risks of procedure as well as the alternatives and risks of each were explained to the (patient/caregiver).  Consent for procedure obtained. Time Out: Verified patient identification, verified procedure, site/side was marked, verified correct patient position, special equipment/implants available, medications/allergies/relevent history reviewed, required imaging and test results available.  Performed  Maximum sterile technique was used including antiseptics, cap, gloves, gown, hand hygiene, mask and sheet. Skin prep: Chlorhexidine; local anesthetic administered A antimicrobial bonded/coated triple lumen catheter was placed in the right internal jugular vein using the Seldinger technique.  Evaluation Blood flow good Complications: No apparent complications Patient did tolerate procedure well. Chest X-ray ordered to verify placement.  CXR: pending.  Shane Banks 02/23/2020, 6:06 PM

## 2020-02-23 NOTE — Progress Notes (Addendum)
[name]   Subjective:   This is a 74 year old gentleman with a history of CVA benign plastic hypertrophy atrial fibrillation developed Covid pneumonia admitted to [facility].  Required ventilator support and eventually tracheostomy.  [facility] 11/21/2019.  Developed complications from UTI Pseudomonas pneumonia stenotrophomonas bacteremia.  Received IV vancomycin developed acute kidney injury started on dialysis 12/16/2019.  Has been dialysis dependent since.  Was admitted to [facility] with hemoptysis 02/14/2020.  Receives dialysis Monday Wednesday Friday.Enterococcus/MRSA positive for blood cultures.  Dialysis catheter removed 02/21/2020.    Removal 1 L 02/21/2020  Blood pressure 111/61 pulse 79 temperature 99.4 O2 sats 9 9% FiO2 30%  Sodium 139 potassium 4.2 chloride 99 CO2 22 BUN 3091 creatinine 3.34 glucose 142 calcium 10.1 hemoglobin 9.3 WBC 16.2  Insulin sliding scale, Protonix 40 mg every 24 hours, Eliquis 2.5 mg twice daily, Cardizem 30 mg every 8 hours  IV vancomycin IV Rocephin 2 g every 24 hours Flagyl   Objective:  Vital signs in last 24 hours:  Temp:  [97.7 F (36.5 C)-100.6 F (38.1 C)] 99.4 F (37.4 C) (05/28 0405) Pulse Rate:  [75-88] 75 (05/28 0600) Resp:  [22-45] 28 (05/28 0600) BP: (88-125)/(50-81) 117/59 (05/28 0600) SpO2:  [94 %-100 %] 100 % (05/28 0600) FiO2 (%):  [30 %] 30 % (05/28 0305)  Weight change:  Filed Weights   02/21/20 0500 02/21/20 0900 02/22/20 0500  Weight: 59.9 kg 59.3 kg 57.7 kg    Intake/Output: I/O last 3 completed shifts: In: 1260 [NG/GT:1260] Out: 690 [Stool:690]   Intake/Output this shift:  No intake/output data recorded.  Genelderly AAM, awake , responsive No jvd or bruits Chestclear to A/P ant and lat RRRbounding precordium, no sig MRG Abd soft ntnd no mass or ascites +bs, +LLQ ostomy, +PEG tube GU normal male MS no  joint effusions or deformity Ext1+ dependent edema Feet in boots Neuro more responsive today , more alert  [facility]   Basic Metabolic Panel: Recent Labs  Lab 02/17/20 0343 02/18/20 0415 02/20/20 0601 02/20/20 0601 02/20/20 1455 02/20/20 1455 02/21/20 0540 02/22/20 0839 02/23/20 0258  NA 138   < > 140  --  139  --  141 137 139  K 3.7   < > 4.9  --  6.0*  --  5.6* 4.2 4.2  CL 98   < > 100  --  98  --  100 95* 99  CO2 26   < > 24  --  22  --  _0 GLUCOSE 164*   < > 199*  --  208*  --  189* 182* 142*  BUN 40*   < > 64*  --  76*  --  96* 62* 91*  CREATININE 1.97*   < > 2.98*  --  3.20*  --  3.80* 2.61* 3.34*  CALCIUM 9.2   < > 9.6   < > 9.8   < > 9.9 10.0 10.1  MG 2.0  --   --   --   --   --   --   --   --   PHOS 3.6  --   --   --  5.0*  --   --   --   --    < > = values in this interval not displayed.    Liver Function Tests: Recent Labs  Lab 02/20/20 1455  ALBUMIN 2.6*   No results for  input(s): LIPASE, AMYLASE in the last 168 hours. No results for input(s): AMMONIA in the last 168 hours.  CBC: Recent Labs  Lab 02/20/20 0601 02/20/20 1455 02/21/20 0252 02/22/20 0839 02/23/20 0258  WBC 19.5* 22.1* 18.9* 19.7* 16.2*  HGB 10.2* 9.8* 9.4* 9.5* 9.3*  HCT 32.0* 30.8* 29.5* 30.2* 29.2*  MCV 85.8 84.4 82.9 83.7 83.2  PLT 186 193 185 202 217    Cardiac Enzymes: No results for input(s): CKTOTAL, CKMB, CKMBINDEX, TROPONINI in the last 168 hours.  BNP: Invalid input(s): POCBNP  CBG: Recent Labs  Lab 02/22/20 1134 02/22/20 1532 02/22/20 1931 02/22/20 2335 02/23/20 0340  GLUCAP 181* 167* 160* 176* 117*    Microbiology: Results for orders placed or performed during the hospital encounter of 02/14/20  Culture, blood (routine x 2)     Status: None   Collection Time: 02/14/20  2:55 PM   Specimen: BLOOD RIGHT WRIST  Result Value Ref Range Status   Specimen Description BLOOD RIGHT WRIST  Final   Special Requests   Final    BOTTLES DRAWN AEROBIC AND  ANAEROBIC Blood Culture results may not be optimal due to an excessive volume of blood received in culture bottles   Culture   Final    NO GROWTH 5 DAYS Performed at [facility], [place] [address], [place], [place] [address]    Report Status 02/19/2020 FINAL  Final  SARS Coronavirus 2 by RT PCR (hospital order, performed in [facility] hospital lab) Nasopharyngeal Nasopharyngeal Swab     Status: None   Collection Time: 02/14/20  3:00 PM   Specimen: Nasopharyngeal Swab  Result Value Ref Range Status   SARS Coronavirus 2 NEGATIVE NEGATIVE Final    Comment: (NOTE) SARS-CoV-2 target nucleic acids are NOT DETECTED. The SARS-CoV-2 RNA is generally detectable in upper and lower respiratory specimens during the acute phase of infection. The lowest concentration of SARS-CoV-2 viral copies this assay can detect is 250 copies / mL. A negative result does not preclude SARS-CoV-2 infection and should not be used as the sole basis for treatment or other patient management decisions.  A negative result may occur with improper specimen collection / handling, submission of specimen other than nasopharyngeal swab, presence of viral mutation(s) within the areas targeted by this assay, and inadequate number of viral copies (<250 copies / mL). A negative result must be combined with clinical observations, patient history, and epidemiological information. Fact Sheet for Patients:   StrictlyIdeas.no Fact Sheet for Healthcare Providers: BankingDealers.co.za This test is not yet approved or cleared  by the [place] FDA and has been authorized for detection and/or diagnosis of SARS-CoV-2 by FDA under an Emergency Use Authorization (EUA).  This EUA will remain in effect (meaning this test can be used) for the duration of the COVID-19 declaration under Section 564(b)(1) of the Act, 21 U.S.C. section 360bbb-3(b)(1), unless the authorization is terminated  or revoked sooner. Performed at [facility], [place] [address], [place], [place] [address]   Culture, bal-quantitative     Status: None   Collection Time: 02/14/20  5:17 PM   Specimen: Bronchoalveolar Lavage; Respiratory  Result Value Ref Range Status   Specimen Description BRONCHIAL ALVEOLAR LAVAGE  Final   Special Requests Normal  Final   Gram Stain   Final    RARE WBC PRESENT,BOTH PMN AND MONONUCLEAR NO ORGANISMS SEEN    Culture   Final    NO GROWTH 2 DAYS Performed at [facility], [address], [place], [place] [address]    Report Status 02/17/2020 FINAL  Final  Culture, blood (routine x 2)     Status: None   Collection Time: 02/14/20  7:20 PM   Specimen: BLOOD  Result Value Ref Range Status   Specimen Description BLOOD RIGHT ANTECUBITAL  Final   Special Requests   Final    BOTTLES DRAWN AEROBIC AND ANAEROBIC Blood Culture adequate volume   Culture   Final    NO GROWTH 5 DAYS Performed at [facility], [place] [address], [place], [place] [address]    Report Status 02/19/2020 FINAL  Final  MRSA PCR Screening     Status: None   Collection Time: 02/16/20  7:30 PM   Specimen: Nasal Mucosa; Nasopharyngeal  Result Value Ref Range Status   MRSA by PCR NEGATIVE NEGATIVE Final    Comment:        The GeneXpert MRSA Assay (FDA approved for NASAL specimens only), is one component of a comprehensive MRSA colonization surveillance program. It is not intended to diagnose MRSA infection nor to guide or monitor treatment for MRSA infections. Performed at [facility], [place] [address] [place], [place] [address]   Culture, blood (Routine X 2) w Reflex to ID Panel     Status: Abnormal (Preliminary result)   Collection Time: 02/17/20  6:26 AM   Specimen: BLOOD RIGHT WRIST  Result Value Ref Range Status   Specimen Description BLOOD RIGHT WRIST  Final   Special Requests AEROBIC BOTTLE ONLY Blood Culture adequate volume  Final   Culture  Setup Time   Final     GRAM POSITIVE COCCI IN CLUSTERS AEROBIC BOTTLE ONLY CRITICAL RESULT CALLED TO, READ BACK BY AND VERIFIED WITH: K. HURTH,PHARMD [id number] 02/19/2020 T. TYSOR    Culture (A)  Final    STAPHYLOCOCCUS EPIDERMIDIS THE SIGNIFICANCE OF ISOLATING THIS ORGANISM FROM A SINGLE SET OF BLOOD CULTURES WHEN MULTIPLE SETS ARE DRAWN IS UNCERTAIN. PLEASE NOTIFY THE MICROBIOLOGY DEPARTMENT WITHIN ONE WEEK IF SPECIATION AND SENSITIVITIES ARE REQUIRED. Performed at [facility], [place] [address], [place], [place] [address]    Report Status PENDING  Incomplete  Culture, blood (Routine X 2) w Reflex to ID Panel     Status: None   Collection Time: 02/17/20  6:26 AM   Specimen: BLOOD RIGHT HAND  Result Value Ref Range Status   Specimen Description BLOOD RIGHT HAND  Final   Special Requests AEROBIC BOTTLE ONLY Blood Culture adequate volume  Final   Culture   Final    NO GROWTH 5 DAYS Performed at [facility], [place] [address], [place], [place] [address]    Report Status 02/22/2020 FINAL  Final  Blood Culture ID Panel (Reflexed)     Status: Abnormal   Collection Time: 02/17/20  6:26 AM  Result Value Ref Range Status   Enterococcus species NOT DETECTED NOT DETECTED Final   Listeria monocytogenes NOT DETECTED NOT DETECTED Final   Staphylococcus species DETECTED (A) NOT DETECTED Final    Comment: Methicillin (oxacillin) resistant coagulase negative staphylococcus. Possible blood culture contaminant (unless isolated from more than one blood culture draw or clinical case suggests pathogenicity). No antibiotic treatment is indicated for blood  culture contaminants. CRITICAL RESULT CALLED TO, READ BACK BY AND VERIFIED WITH: K. HURTH,PHARMD [id number] 02/19/2020 T. TYSOR    Staphylococcus aureus (BCID) NOT DETECTED NOT DETECTED Final   Methicillin resistance DETECTED (A) NOT DETECTED Final    Comment: CRITICAL RESULT CALLED  TO, READ BACK BY AND VERIFIED WITH: K. HURTH,PHARMD [id number] 02/19/2020 T. TYSOR    Streptococcus  species NOT DETECTED NOT DETECTED Final   Streptococcus agalactiae NOT DETECTED NOT DETECTED Final   Streptococcus pneumoniae NOT DETECTED NOT DETECTED Final   Streptococcus pyogenes NOT DETECTED NOT DETECTED Final   Acinetobacter baumannii NOT DETECTED NOT DETECTED Final   Enterobacteriaceae species NOT DETECTED NOT DETECTED Final   Enterobacter cloacae complex NOT DETECTED NOT DETECTED Final   Escherichia coli NOT DETECTED NOT DETECTED Final   Klebsiella oxytoca NOT DETECTED NOT DETECTED Final   Klebsiella pneumoniae NOT DETECTED NOT DETECTED Final   Proteus species NOT DETECTED NOT DETECTED Final   Serratia marcescens NOT DETECTED NOT DETECTED Final   Haemophilus influenzae NOT DETECTED NOT DETECTED Final   Neisseria meningitidis NOT DETECTED NOT DETECTED Final   Pseudomonas aeruginosa NOT DETECTED NOT DETECTED Final   Candida albicans NOT DETECTED NOT DETECTED Final   Candida glabrata NOT DETECTED NOT DETECTED Final   Candida krusei NOT DETECTED NOT DETECTED Final   Candida parapsilosis NOT DETECTED NOT DETECTED Final   Candida tropicalis NOT DETECTED NOT DETECTED Final    Comment: Performed at [facility], [place] [address] [place], [place] [address]  Culture, respiratory (non-expectorated)     Status: None   Collection Time: 02/17/20  7:30 AM   Specimen: Tracheal Aspirate; Respiratory  Result Value Ref Range Status   Specimen Description TRACHEAL ASPIRATE  Final   Special Requests Normal  Final   Gram Stain   Final    RARE WBC PRESENT, PREDOMINANTLY PMN NO ORGANISMS SEEN Performed at [facility], [place] [address], [place], [place] [address]    Culture RARE CANDIDA PARAPSILOSIS  Final   Report Status 02/20/2020 FINAL  Final  Culture, respiratory (non-expectorated)     Status: None (Preliminary result)   Collection Time: 02/20/20 12:38 PM   Specimen: Tracheal Aspirate; Respiratory  Result Value Ref Range Status   Specimen Description TRACHEAL ASPIRATE  Final    Special Requests NONE  Final   Gram Stain   Final    RARE WBC PRESENT, PREDOMINANTLY PMN RARE SQUAMOUS EPITHELIAL CELLS PRESENT FEW GRAM POSITIVE RODS RARE GRAM POSITIVE COCCI    Culture   Final    ABUNDANT DIPHTHEROIDS(CORYNEBACTERIUM SPECIES) FEW YEAST CULTURE REINCUBATED FOR BETTER GROWTH Performed at [facility], [place] [address] [place], [place] [address]    Report Status PENDING  Incomplete  Culture, blood (routine x 2)     Status: Abnormal (Preliminary result)   Collection Time: 02/20/20 12:50 PM   Specimen: BLOOD LEFT HAND  Result Value Ref Range Status   Specimen Description BLOOD LEFT HAND  Final   Special Requests   Final    BOTTLES DRAWN AEROBIC AND ANAEROBIC Blood Culture adequate volume   Culture  Setup Time   Final    GRAM POSITIVE COCCI IN CHAINS IN BOTH AEROBIC AND ANAEROBIC BOTTLES CRITICAL RESULT CALLED TO, READ BACK BY AND VERIFIED WITH: Jens Som AMEND [id number] FCP Performed at [facility], [place] [address] [place], [place] [address]    Culture ENTEROCOCCUS FAECALIS (A)  Final   Report Status PENDING  Incomplete  Blood Culture ID Panel (Reflexed)     Status: Abnormal   Collection Time: 02/20/20 12:50 PM  Result Value Ref Range Status   Enterococcus species DETECTED (A) NOT DETECTED Final    Comment: CRITICAL RESULT CALLED TO, READ BACK BY AND VERIFIED WITH: PHARMD  KAREN AMEND [id number] FCP    Vancomycin resistance NOT DETECTED NOT DETECTED Final   Listeria monocytogenes NOT DETECTED NOT DETECTED Final   Staphylococcus species DETECTED (A) NOT DETECTED Final    Comment: Methicillin (oxacillin) resistant coagulase negative staphylococcus. Possible blood culture contaminant (unless isolated from more than one blood culture draw or clinical case suggests pathogenicity). No antibiotic treatment is indicated for blood  culture contaminants. CRITICAL RESULT CALLED TO, READ BACK BY AND VERIFIED WITH: PHARMD KAREN AMEND [id number] FCP     Staphylococcus aureus (BCID) NOT DETECTED NOT DETECTED Final   Methicillin resistance DETECTED (A) NOT DETECTED Final    Comment: CRITICAL RESULT CALLED TO, READ BACK BY AND VERIFIED WITH: PHARMD KAREN AMEND [id number] FCP    Streptococcus species NOT DETECTED NOT DETECTED Final   Streptococcus agalactiae NOT DETECTED NOT DETECTED Final   Streptococcus pneumoniae NOT DETECTED NOT DETECTED Final   Streptococcus pyogenes NOT DETECTED NOT DETECTED Final   Acinetobacter baumannii NOT DETECTED NOT DETECTED Final   Enterobacteriaceae species NOT DETECTED NOT DETECTED Final   Enterobacter cloacae complex NOT DETECTED NOT DETECTED Final   Escherichia coli NOT DETECTED NOT DETECTED Final   Klebsiella oxytoca NOT DETECTED NOT DETECTED Final   Klebsiella pneumoniae NOT DETECTED NOT DETECTED Final   Proteus species NOT DETECTED NOT DETECTED Final   Serratia marcescens NOT DETECTED NOT DETECTED Final   Haemophilus influenzae NOT DETECTED NOT DETECTED Final   Neisseria meningitidis NOT DETECTED NOT DETECTED Final   Pseudomonas aeruginosa NOT DETECTED NOT DETECTED Final   Candida albicans NOT DETECTED NOT DETECTED Final   Candida glabrata NOT DETECTED NOT DETECTED Final   Candida krusei NOT DETECTED NOT DETECTED Final   Candida parapsilosis NOT DETECTED NOT DETECTED Final   Candida tropicalis NOT DETECTED NOT DETECTED Final    Comment: Performed at [facility], [place]. [address], [place], [place] [address]  Culture, blood (routine x 2)     Status: None (Preliminary result)   Collection Time: 02/20/20 12:58 PM   Specimen: BLOOD LEFT ARM  Result Value Ref Range Status   Specimen Description BLOOD LEFT ARM  Final   Special Requests   Final    BOTTLES DRAWN AEROBIC AND ANAEROBIC Blood Culture adequate volume   Culture   Final    NO GROWTH 3 DAYS Performed at [facility], [place] [address] [place], [place] [address]    Report Status PENDING  Incomplete  Culture, blood (Routine X 2) w  Reflex to ID Panel     Status: None (Preliminary result)   Collection Time: 02/22/20 11:25 AM   Specimen: BLOOD RIGHT HAND  Result Value Ref Range Status   Specimen Description BLOOD RIGHT HAND  Final   Special Requests AEROBIC BOTTLE ONLY Blood Culture adequate volume  Final   Culture   Final    NO GROWTH < 24 HOURS Performed at [facility], [place] [address] [place], [place] [address]    Report Status PENDING  Incomplete  Culture, blood (Routine X 2) w Reflex to ID Panel     Status: None (Preliminary result)   Collection Time: 02/22/20 11:25 AM   Specimen: BLOOD RIGHT HAND  Result Value Ref Range Status   Specimen Description BLOOD RIGHT HAND  Final   Special Requests AEROBIC BOTTLE ONLY Blood Culture adequate volume  Final   Culture   Final    NO GROWTH < 24 HOURS Performed at [facility], [place]  [address], [place], [place] [address]    Report Status PENDING  Incomplete    Coagulation Studies: No results for input(s): LABPROT, INR in the last 72 hours.  Urinalysis: No results for input(s): COLORURINE, LABSPEC, PHURINE, GLUCOSEU, HGBUR, BILIRUBINUR, KETONESUR, PROTEINUR, UROBILINOGEN, NITRITE, LEUKOCYTESUR in the last 72 hours.  Invalid input(s): APPERANCEUR    Imaging: ECHOCARDIOGRAM COMPLETE  Result Date: 02/22/2020    ECHOCARDIOGRAM REPORT   Patient Name:   Shane Banks Date of Exam: 02/22/2020 Medical Rec #:  [id number]    Height:       69.0 in Accession #:    [id number]   Weight:       127.2 lb Date of Birth:  18-Jan-1946    BSA:          1.705 m Patient Age:    46 years     BP:           104/63 mmHg Patient Gender: M            HR:           77 bpm. Exam Location:  Inpatient Procedure: 2D Echo Indications:    Bacteremia 790.7 / R78.81  History:        Patient has prior history of Echocardiogram examinations, most                 recent 12/19/2019. Arrythmias:Atrial Fibrillation. Sepsis.  Sonographer:    Vikki Ports Turrentine Referring Phys: [id number] [facility] MIKHAIL   Sonographer Comments: Echo performed with patient supine and on artificial respirator and no subcostal window. No SSN. Tracheostomy and peg tube in place. IMPRESSIONS  1. Left ventricular ejection fraction, by estimation, is 60 to 65%. The left ventricle has normal function. The left ventricle has no regional wall motion abnormalities. There is severe left ventricular hypertrophy. Left ventricular diastolic parameters  are consistent with Grade I diastolic dysfunction (impaired relaxation).  2. Right ventricular systolic function is normal. The right ventricular size is normal. There is mildly elevated pulmonary artery systolic pressure.  3. Left atrial size was mildly dilated.  4. Right atrial size was mildly dilated.  5. The mitral valve is grossly normal. Trivial mitral valve regurgitation.  6. The aortic valve is tricuspid. Aortic valve regurgitation is trivial. Conclusion(s)/Recommendation(s): No evidence of valvular vegetations on this transthoracic echocardiogram. Would recommend a transesophageal echocardiogram to exclude infective endocarditis if clinically indicated. FINDINGS  Left Ventricle: Left ventricular ejection fraction, by estimation, is 60 to 65%. The left ventricle has normal function. The left ventricle has no regional wall motion abnormalities. The left ventricular internal cavity size was normal in size. There is  severe left ventricular hypertrophy. Left ventricular diastolic parameters are consistent with Grade I diastolic dysfunction (impaired relaxation). Indeterminate filling pressures. Right Ventricle: The right ventricular size is normal. No increase in right ventricular wall thickness. Right ventricular systolic function is normal. There is mildly elevated pulmonary artery systolic pressure. The tricuspid regurgitant velocity is 3.00  m/s, and with an assumed right atrial pressure of 8 mmHg, the estimated right ventricular systolic pressure is 80.3 mmHg. Left Atrium: Left atrial size  was mildly dilated. Right Atrium: Right atrial size was mildly dilated. Pericardium: Trivial pericardial effusion is present. The pericardial effusion is posterior to the left ventricle. Mitral Valve: The mitral valve is grossly normal. Trivial mitral valve regurgitation. Tricuspid Valve: The tricuspid valve is grossly normal. Tricuspid valve regurgitation is mild. Aortic Valve: The aortic valve is tricuspid. Aortic valve regurgitation is trivial. Pulmonic Valve: The pulmonic  valve was grossly normal. Pulmonic valve regurgitation is trivial. Aorta: The aortic root and ascending aorta are structurally normal, with no evidence of dilitation. IAS/Shunts: No atrial level shunt detected by color flow Doppler.  LEFT VENTRICLE PLAX 2D LVIDd:         3.40 cm  Diastology LVIDs:         2.30 cm  LV e' lateral:   6.83 cm/s LV PW:         1.40 cm  LV E/e' lateral: 13.4 LV IVS:        1.40 cm  LV e' medial:    5.93 cm/s LVOT diam:     2.10 cm  LV E/e' medial:  15.4 LV SV:         56 LV SV Index:   33 LVOT Area:     3.46 cm  RIGHT VENTRICLE RV S prime:     11.10 cm/s TAPSE (M-mode): 1.4 cm LEFT ATRIUM           Index       RIGHT ATRIUM           Index LA diam:      3.50 cm 2.05 cm/m  RA Area:     20.80 cm LA Vol (A2C): 64.3 ml 37.72 ml/m RA Volume:   59.20 ml  34.73 ml/m LA Vol (A4C): 32.1 ml 18.83 ml/m  AORTIC VALVE LVOT Vmax:   102.00 cm/s LVOT Vmean:  72.600 cm/s LVOT VTI:    0.163 m  AORTA Ao Root diam: 4.00 cm MITRAL VALVE               TRICUSPID VALVE MV Area (PHT): 4.63 cm    TR Peak grad:   36.0 mmHg MV Decel Time: 164 msec    TR Vmax:        300.00 cm/s MV E velocity: 91.30 cm/s MV A velocity: 67.70 cm/s  SHUNTS MV E/A ratio:  1.35        Systemic VTI:  0.16 m                            Systemic Diam: 2.10 cm Lyman Bishop MD Electronically signed by Lyman Bishop MD Signature Date/Time: 02/22/2020/12:03:41 PM    Final      Medications:   . sodium chloride    . cefTRIAXone (ROCEPHIN)  IV 2 g (02/22/20 1736)   . feeding supplement (NEPRO CARB STEADY) Stopped (02/23/20 0000)   . chlorhexidine gluconate (MEDLINE KIT)  15 mL Mouth Rinse BID  . Chlorhexidine Gluconate Cloth  6 each Topical Daily  . Chlorhexidine Gluconate Cloth  6 each Topical Q0600  . diltiazem  30 mg Per Tube Q8H  . docusate  100 mg Per Tube BID  . feeding supplement (PRO-STAT SUGAR FREE 64)  30 mL Per Tube BID  . insulin aspart  0-9 Units Subcutaneous Q4H  . mouth rinse  15 mL Mouth Rinse 10 times per day  . metroNIDAZOLE  500 mg Per Tube Q8H  . pantoprazole (PROTONIX) IV  40 mg Intravenous Q24H  . polyethylene glycol  17 g Per Tube Daily  . vancomycin variable dose per unstable renal function (pharmacist dosing)   Does not apply See admin instructions   sodium chloride, acetaminophen (TYLENOL) oral liquid 160 mg/5 mL, alteplase, fentaNYL (SUBLIMAZE) injection, fentaNYL (SUBLIMAZE) injection, heparin, lidocaine (PF), lidocaine-prilocaine, pentafluoroprop-tetrafluoroeth  Assessment/ Plan:  1. Hemoptysis - in pt w/ trach at [facility],  admitted here for bleeding from trach site on 5/19. Seems to be resolved.   Seems to be ulceration of stoma 2. Acute/chronic resp failure - sp COVID pna earlier this year, also course at [facility] complicated by multiple infections/ PNA. Sp trach. Vancomycin restarted. 02/21/2019 reculturing due to fever.  Enterococcus/MRSA positive for blood cultures.  Dialysis catheter removed 02/21/2020.  Appreciate assistance from Dr. Baxter Flattery.  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  Recommending sacral biopsy.  Continues on Rocephin and Flagyl.  As well as vancomycin.  Repeat blood cultures ordered 3. 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  Right IJ TDC removed.  Patient was receiving dialysis Monday Wednesday Friday.  Received dialysis 02/21/2020 with removal of 1 L.  Currently line holiday next dialysis will be planned for Saturday,  02/24/2020 4. BP/ volume excess - 1 L removed 02/21/2020 5. AMS -mental status close to baseline 6. Atrial fib - per primary Eliquis and Cardizem for rate control 7. Anemia ckd / abl -status post transfusion 2 packs unit blood cells 8. Nutrition - on PEG feeds, +diverting colostomy 9. Sacral decub - per primary, WOC        LOS: Lillie _0 _1 :02 AM

## 2020-02-23 NOTE — Progress Notes (Addendum)
CSW spoke with Stanton Kidney of [facility] who states none of the facilities can accommodate the patient with his dialysis needs.   CSW attempted to reach admissions staff at [facility] in [place], [place] to make a referral for this patient - a voicemail was left requesting a return call.   Madilyn Fireman, MSW, LCSW-A Transitions of Care  Clinical Social Worker  [facility] Emergency Departments  Medical ICU [phone]

## 2020-02-23 NOTE — Progress Notes (Signed)
[facility] for Infectious Disease    Date of Admission:  02/14/2020   Total days of antibiotics 3   ID: Shaunak Kreis is a 74 y.o. male with   Principal Problem:   Enterococcal bacteremia Active Problems:   Decubitus ulcer of sacral region, stage 4 (HCC)   Fever   Hemoptysis   Pressure injury of skin   Protein-calorie malnutrition, severe    Subjective: Alert.  Line removed 5/26  Medications:  . chlorhexidine gluconate (MEDLINE KIT)  15 mL Mouth Rinse BID  . Chlorhexidine Gluconate Cloth  6 each Topical Q0600  . diltiazem  30 mg Per Tube Q8H  . docusate  100 mg Per Tube BID  . feeding supplement (PRO-STAT SUGAR FREE 64)  30 mL Per Tube BID  . insulin aspart  0-9 Units Subcutaneous Q4H  . mouth rinse  15 mL Mouth Rinse 10 times per day  . metroNIDAZOLE  500 mg Per Tube Q8H  . pantoprazole (PROTONIX) IV  40 mg Intravenous Q24H  . polyethylene glycol  17 g Per Tube Daily  . vancomycin variable dose per unstable renal function (pharmacist dosing)   Does not apply See admin instructions    Objective: Vital signs in last 24 hours: Temp:  [97.9 F (36.6 C)-100.6 F (38.1 C)] 97.9 F (36.6 C) (05/28 1100) Pulse Rate:  [75-105] 105 (05/28 1225) Resp:  [22-43] 37 (05/28 1225) BP: (88-127)/(50-83) 127/76 (05/28 1424) SpO2:  [96 %-100 %] 100 % (05/28 1225) FiO2 (%):  [30 %] 30 % (05/28 1225)  Physical Exam  Constitutional: He appears cachetic and chronically ill. No distress.  HENT:  Mouth/Throat: Oropharynx is clear and moist. No oropharyngeal exudate.  Cardiovascular: Normal rate, regular rhythm and normal heart sounds. Exam reveals no gallop and no friction rub.  No murmur heard.  Pulmonary/Chest: Effort normal and bilateral rhonchi Abdominal: Soft. Bowel sounds are normal. He exhibits no distension. There is no tenderness. Peg and ostomy in place Back/buttock = large stage 4 with granualtion tissue some undermining, has palpable/visible bone noted Neurological:  He is alert and oriented to person, place, and time.  Skin: Skin is warm and dry. No rash noted. No erythema.  Psychiatric: He has a normal mood and affect. His behavior is normal.     Lab Results Recent Labs    02/22/20 0839 02/23/20 0258  WBC 19.7* 16.2*  HGB 9.5* 9.3*  HCT 30.2* 29.2*  NA 137 139  K 4.2 4.2  CL 95* 99  CO2 24 22  BUN 62* 91*  CREATININE 2.61* 3.34*   Liver Panel No results for input(s): PROT, ALBUMIN, AST, ALT, ALKPHOS, BILITOT, BILIDIR, IBILI in the last 72 hours.  Microbiology: 5/22 staph epi  Blood cx 5/25 MR CoAg still pending, and enterococcus still pending - in 1 set of blood cx  Studies/Results: ECHOCARDIOGRAM COMPLETE  Result Date: 02/22/2020    ECHOCARDIOGRAM REPORT   Patient Name:   ERICH KOCHAN Date of Exam: 02/22/2020 Medical Rec #:  [id number]    Height:       69.0 in Accession #:    [id number]   Weight:       127.2 lb Date of Birth:  May 17, 1946    BSA:          1.705 m Patient Age:    16 years     BP:           104/63 mmHg Patient Gender: M  HR:           77 bpm. Exam Location:  Inpatient Procedure: 2D Echo Indications:    Bacteremia 790.7 / R78.81  History:        Patient has prior history of Echocardiogram examinations, most                 recent 12/19/2019. Arrythmias:Atrial Fibrillation. Sepsis.  Sonographer:    Vikki Ports Turrentine Referring Phys: [id number] [facility] MIKHAIL  Sonographer Comments: Echo performed with patient supine and on artificial respirator and no subcostal window. No SSN. Tracheostomy and peg tube in place. IMPRESSIONS  1. Left ventricular ejection fraction, by estimation, is 60 to 65%. The left ventricle has normal function. The left ventricle has no regional wall motion abnormalities. There is severe left ventricular hypertrophy. Left ventricular diastolic parameters  are consistent with Grade I diastolic dysfunction (impaired relaxation).  2. Right ventricular systolic function is normal. The right ventricular size is  normal. There is mildly elevated pulmonary artery systolic pressure.  3. Left atrial size was mildly dilated.  4. Right atrial size was mildly dilated.  5. The mitral valve is grossly normal. Trivial mitral valve regurgitation.  6. The aortic valve is tricuspid. Aortic valve regurgitation is trivial. Conclusion(s)/Recommendation(s): No evidence of valvular vegetations on this transthoracic echocardiogram. Would recommend a transesophageal echocardiogram to exclude infective endocarditis if clinically indicated. FINDINGS  Left Ventricle: Left ventricular ejection fraction, by estimation, is 60 to 65%. The left ventricle has normal function. The left ventricle has no regional wall motion abnormalities. The left ventricular internal cavity size was normal in size. There is  severe left ventricular hypertrophy. Left ventricular diastolic parameters are consistent with Grade I diastolic dysfunction (impaired relaxation). Indeterminate filling pressures. Right Ventricle: The right ventricular size is normal. No increase in right ventricular wall thickness. Right ventricular systolic function is normal. There is mildly elevated pulmonary artery systolic pressure. The tricuspid regurgitant velocity is 3.00  m/s, and with an assumed right atrial pressure of 8 mmHg, the estimated right ventricular systolic pressure is 51.7 mmHg. Left Atrium: Left atrial size was mildly dilated. Right Atrium: Right atrial size was mildly dilated. Pericardium: Trivial pericardial effusion is present. The pericardial effusion is posterior to the left ventricle. Mitral Valve: The mitral valve is grossly normal. Trivial mitral valve regurgitation. Tricuspid Valve: The tricuspid valve is grossly normal. Tricuspid valve regurgitation is mild. Aortic Valve: The aortic valve is tricuspid. Aortic valve regurgitation is trivial. Pulmonic Valve: The pulmonic valve was grossly normal. Pulmonic valve regurgitation is trivial. Aorta: The aortic root and  ascending aorta are structurally normal, with no evidence of dilitation. IAS/Shunts: No atrial level shunt detected by color flow Doppler.  LEFT VENTRICLE PLAX 2D LVIDd:         3.40 cm  Diastology LVIDs:         2.30 cm  LV e' lateral:   6.83 cm/s LV PW:         1.40 cm  LV E/e' lateral: 13.4 LV IVS:        1.40 cm  LV e' medial:    5.93 cm/s LVOT diam:     2.10 cm  LV E/e' medial:  15.4 LV SV:         56 LV SV Index:   33 LVOT Area:     3.46 cm  RIGHT VENTRICLE RV S prime:     11.10 cm/s TAPSE (M-mode): 1.4 cm LEFT ATRIUM  Index       RIGHT ATRIUM           Index LA diam:      3.50 cm 2.05 cm/m  RA Area:     20.80 cm LA Vol (A2C): 64.3 ml 37.72 ml/m RA Volume:   59.20 ml  34.73 ml/m LA Vol (A4C): 32.1 ml 18.83 ml/m  AORTIC VALVE LVOT Vmax:   102.00 cm/s LVOT Vmean:  72.600 cm/s LVOT VTI:    0.163 m  AORTA Ao Root diam: 4.00 cm MITRAL VALVE               TRICUSPID VALVE MV Area (PHT): 4.63 cm    TR Peak grad:   36.0 mmHg MV Decel Time: 164 msec    TR Vmax:        300.00 cm/s MV E velocity: 91.30 cm/s MV A velocity: 67.70 cm/s  SHUNTS MV E/A ratio:  1.35        Systemic VTI:  0.16 m                            Systemic Diam: 2.10 cm Lyman Bishop MD Electronically signed by Lyman Bishop MD Signature Date/Time: 02/22/2020/12:03:41 PM    Final      Assessment/Plan: #1 Polymicrobial Bacteremia --> has had intermittent bacteremia with MRSE (initially thought to be contaminant) now re-isolated along with enterococcus faecalis. Concern over source from sacral wound (likely chronic osteomyelitis with exposed bone) vs pneumonia vs HD line.  Transthoracic echo was normal --> may need to consider TEE   #2 Pneumonia --> chronic trach, was tolerating TC until he became ill and needed vent support for a few days. History of MDR pseudomonas, stenotrophomonas. This seems improved.   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 This will be helpful to identify if he has any  abscess; I am sure it will comment on osteomyelitis playing a role.  Of most benefit will be bone tissue biopsy for culture and pathology (not a bone marrow biopsy)   He continues to have intermittent fever and leukocytosis that we suspect is due to #3. 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    Janene Madeira, MSN, NP-C [facility] for Infectious Disease [email or web address] Pager: [phone] Office: [phone] [place]: [phone]

## 2020-02-24 DIAGNOSIS — J9602 Acute respiratory failure with hypercapnia: Secondary | ICD-10-CM

## 2020-02-24 DIAGNOSIS — R509 Fever, unspecified: Secondary | ICD-10-CM

## 2020-02-24 DIAGNOSIS — J9601 Acute respiratory failure with hypoxia: Secondary | ICD-10-CM

## 2020-02-24 DIAGNOSIS — B952 Enterococcus as the cause of diseases classified elsewhere: Secondary | ICD-10-CM

## 2020-02-24 LAB — CULTURE, BLOOD (ROUTINE X 2)
Special Requests: ADEQUATE
Special Requests: ADEQUATE

## 2020-02-24 LAB — BASIC METABOLIC PANEL
Anion gap: 21 — ABNORMAL HIGH (ref 5–15)
BUN: 128 mg/dL — ABNORMAL HIGH (ref 8–23)
CO2: 19 mmol/L — ABNORMAL LOW (ref 22–32)
Calcium: 10.3 mg/dL (ref 8.9–10.3)
Chloride: 99 mmol/L (ref 98–111)
Creatinine, Ser: 4.62 mg/dL — ABNORMAL HIGH (ref 0.61–1.24)
GFR calc Af Amer: 14 mL/min — ABNORMAL LOW (ref >60)
GFR calc non Af Amer: 12 mL/min — ABNORMAL LOW (ref >60)
Glucose, Bld: 182 mg/dL — ABNORMAL HIGH (ref 70–99)
Potassium: 4.6 mmol/L (ref 3.5–5.1)
Sodium: 139 mmol/L (ref 135–145)

## 2020-02-24 LAB — CULTURE, RESPIRATORY W GRAM STAIN

## 2020-02-24 LAB — CBC
HCT: 29.3 % — ABNORMAL LOW (ref 39.0–52.0)
Hemoglobin: 9.3 g/dL — ABNORMAL LOW (ref 13.0–17.0)
MCH: 25.9 pg — ABNORMAL LOW (ref 26.0–34.0)
MCHC: 31.7 g/dL (ref 30.0–36.0)
MCV: 81.6 fL (ref 80.0–100.0)
Platelets: 256 10*3/uL (ref 150–400)
RBC: 3.59 MIL/uL — ABNORMAL LOW (ref 4.22–5.81)
RDW: 17.8 % — ABNORMAL HIGH (ref 11.5–15.5)
WBC: 20.9 10*3/uL — ABNORMAL HIGH (ref 4.0–10.5)
nRBC: 0 % (ref 0.0–0.2)

## 2020-02-24 LAB — GLUCOSE, CAPILLARY
Glucose-Capillary: 153 mg/dL — ABNORMAL HIGH (ref 70–99)
Glucose-Capillary: 156 mg/dL — ABNORMAL HIGH (ref 70–99)
Glucose-Capillary: 158 mg/dL — ABNORMAL HIGH (ref 70–99)
Glucose-Capillary: 166 mg/dL — ABNORMAL HIGH (ref 70–99)
Glucose-Capillary: 167 mg/dL — ABNORMAL HIGH (ref 70–99)
Glucose-Capillary: 183 mg/dL — ABNORMAL HIGH (ref 70–99)

## 2020-02-24 MED ORDER — VANCOMYCIN HCL 500 MG/100ML IV SOLN
500.0000 mg | Freq: Once | INTRAVENOUS | Status: AC
  Administered 2020-02-24: 500 mg via INTRAVENOUS
  Filled 2020-02-24: qty 100

## 2020-02-24 NOTE — Progress Notes (Signed)
[name]   Subjective:   This is a 74 year old gentleman with a history of CVA benign plastic hypertrophy atrial fibrillation developed Covid pneumonia admitted to [facility].  Required ventilator support and eventually tracheostomy.  [facility]21/[facility].  Developed complications from UTI Pseudomonas pneumonia stenotrophomonas bacteremia.  Received IV vancomycin developed acute kidney injury started on dialysis 12/16/2019.  Has been dialysis dependent since.  Was admitted to [facility] with hemoptysis 02/14/2020.  Receives dialysis Monday Wednesday Friday.Enterococcus/MRSA positive for blood cultures.  Dialysis catheter removed 02/21/2020.    Removal 1 L 02/21/2020  Blood pressure 102/61 pulse 103 temperature 100.3 O2 sats 100% FiO2 40%  Sodium 139 potassium 4.2 chloride 99 CO2 22 BUN 3091 creatinine 3.34 glucose 142 calcium 10.1 hemoglobin 9.3 WBC 20.9  Insulin sliding scale, Protonix 40 mg every 24 hours, Eliquis 2.5 mg twice daily, Cardizem 30 mg every 8 hours  IV vancomycin IV Rocephin 2 g every 24 hours Flagyl   Objective:  Vital signs in last 24 hours:  Temp:  [97.9 F (36.6 C)-101.5 F (38.6 C)] 100.3 F (37.9 C) (05/29 0736) Pulse Rate:  [48-111] 106 (05/29 0600) Resp:  [23-48] 28 (05/29 0600) BP: (97-127)/(54-83) 107/59 (05/29 0600) SpO2:  [98 %-100 %] 100 % (05/29 0600) FiO2 (%):  [30 %-40 %] 40 % (05/29 0736)  Weight change:  Filed Weights   02/21/20 0500 02/21/20 0900 02/22/20 0500  Weight: 59.9 kg 59.3 kg 57.7 kg    Intake/Output: I/O last 3 completed shifts: In: 624.5 [NG/GT:524.5; IV Piggyback:100] Out: 150 [Stool:150]   Intake/Output this shift:  No intake/output data recorded.  Genelderly AAM, awake , responsive No jvd or bruits Chestclear to A/P ant and lat RRRbounding precordium, no sig MRG Abd soft ntnd no mass or ascites +bs, +LLQ ostomy, +PEG  tube GU normal male MS no joint effusions or deformity Ext1+ dependent edema Feet in boots Neuro more responsive today , more alert  [facility]   Basic Metabolic Panel: Recent Labs  Lab 02/20/20 0601 02/20/20 0601 02/20/20 1455 02/20/20 1455 02/21/20 0540 02/22/20 0839 02/23/20 0258  NA 140  --  139  --  141 137 139  K 4.9  --  6.0*  --  5.6* 4.2 4.2  CL 100  --  98  --  100 95* 99  CO2 24  --  22  --  _0 GLUCOSE 199*  --  208*  --  189* 182* 142*  BUN 64*  --  76*  --  96* 62* 91*  CREATININE 2.98*  --  3.20*  --  3.80* 2.61* 3.34*  CALCIUM 9.6   < > 9.8   < > 9.9 10.0 10.1  PHOS  --   --  5.0*  --   --   --   --    < > = values in this interval not displayed.    Liver Function Tests: Recent Labs  Lab 02/20/20 1455  ALBUMIN 2.6*   No results for input(s): LIPASE, AMYLASE in the last 168 hours. No results for input(s): AMMONIA in the last 168 hours.  CBC: Recent Labs  Lab 02/20/20 1455 02/21/20 0252 02/22/20 0839 02/23/20 0258 02/24/20 0742  WBC 22.1* 18.9* 19.7* 16.2* 20.9*  HGB 9.8* 9.4* 9.5* 9.3* 9.3*  HCT 30.8* 29.5* 30.2* 29.2* 29.3*  MCV 84.4 82.9 83.7 83.2 81.6  PLT 193 185 202 217 256    Cardiac  Enzymes: No results for input(s): CKTOTAL, CKMB, CKMBINDEX, TROPONINI in the last 168 hours.  BNP: Invalid input(s): POCBNP  CBG: Recent Labs  Lab 02/23/20 1705 02/23/20 1940 02/23/20 2323 02/24/20 0344 02/24/20 0740  GLUCAP 116* 126* 144* 156* 92*    Microbiology: Results for orders placed or performed during the hospital encounter of 02/14/20  Culture, blood (routine x 2)     Status: None   Collection Time: 02/14/20  2:55 PM   Specimen: BLOOD RIGHT WRIST  Result Value Ref Range Status   Specimen Description BLOOD RIGHT WRIST  Final   Special Requests   Final    BOTTLES DRAWN AEROBIC AND ANAEROBIC Blood Culture results may not be optimal due to an excessive volume of blood received in culture bottles   Culture   Final    NO  GROWTH 5 DAYS Performed at [facility], [place] [address] [place], [place] [address]    Report Status 02/19/2020 FINAL  Final  SARS Coronavirus 2 by RT PCR (hospital order, performed in [facility] hospital lab) Nasopharyngeal Nasopharyngeal Swab     Status: None   Collection Time: 02/14/20  3:00 PM   Specimen: Nasopharyngeal Swab  Result Value Ref Range Status   SARS Coronavirus 2 NEGATIVE NEGATIVE Final    Comment: (NOTE) SARS-CoV-2 target nucleic acids are NOT DETECTED. The SARS-CoV-2 RNA is generally detectable in upper and lower respiratory specimens during the acute phase of infection. The lowest concentration of SARS-CoV-2 viral copies this assay can detect is 250 copies / mL. A negative result does not preclude SARS-CoV-2 infection and should not be used as the sole basis for treatment or other patient management decisions.  A negative result may occur with improper specimen collection / handling, submission of specimen other than nasopharyngeal swab, presence of viral mutation(s) within the areas targeted by this assay, and inadequate number of viral copies (<250 copies / mL). A negative result must be combined with clinical observations, patient history, and epidemiological information. Fact Sheet for Patients:   StrictlyIdeas.no Fact Sheet for Healthcare Providers: BankingDealers.co.za This test is not yet approved or cleared  by the [place] FDA and has been authorized for detection and/or diagnosis of SARS-CoV-2 by FDA under an Emergency Use Authorization (EUA).  This EUA will remain in effect (meaning this test can be used) for the duration of the COVID-19 declaration under Section 564(b)(1) of the Act, 21 U.S.C. section 360bbb-3(b)(1), unless the authorization is terminated or revoked sooner. Performed at [facility], [place] [address], [place], [place] [address]   Culture, bal-quantitative     Status: None    Collection Time: 02/14/20  5:17 PM   Specimen: Bronchoalveolar Lavage; Respiratory  Result Value Ref Range Status   Specimen Description BRONCHIAL ALVEOLAR LAVAGE  Final   Special Requests Normal  Final   Gram Stain   Final    RARE WBC PRESENT,BOTH PMN AND MONONUCLEAR NO ORGANISMS SEEN    Culture   Final    NO GROWTH 2 DAYS Performed at [facility], [address], [place], [place] [address]    Report Status 02/17/2020 FINAL  Final  Culture, blood (routine x 2)     Status: None   Collection Time: 02/14/20  7:20 PM   Specimen: BLOOD  Result Value Ref Range Status   Specimen Description BLOOD RIGHT ANTECUBITAL  Final   Special Requests   Final    BOTTLES DRAWN AEROBIC AND ANAEROBIC Blood Culture adequate volume  Culture   Final    NO GROWTH 5 DAYS Performed at [facility], [place] [address] [place], [place] [address]    Report Status 02/19/2020 FINAL  Final  MRSA PCR Screening     Status: None   Collection Time: 02/16/20  7:30 PM   Specimen: Nasal Mucosa; Nasopharyngeal  Result Value Ref Range Status   MRSA by PCR NEGATIVE NEGATIVE Final    Comment:        The GeneXpert MRSA Assay (FDA approved for NASAL specimens only), is one component of a comprehensive MRSA colonization surveillance program. It is not intended to diagnose MRSA infection nor to guide or monitor treatment for MRSA infections. Performed at [facility], [place] [address] [place], [place] [address]   Culture, blood (Routine X 2) w Reflex to ID Panel     Status: Abnormal   Collection Time: 02/17/20  6:26 AM   Specimen: BLOOD RIGHT WRIST  Result Value Ref Range Status   Specimen Description BLOOD RIGHT WRIST  Final   Special Requests AEROBIC BOTTLE ONLY Blood Culture adequate volume  Final   Culture  Setup Time   Final    GRAM POSITIVE COCCI IN CLUSTERS AEROBIC BOTTLE ONLY CRITICAL RESULT CALLED TO, READ BACK BY AND VERIFIED WITH: K. HURTH,PHARMD [id number] 02/19/2020 T. TYSOR     Culture (A)  Final    STAPHYLOCOCCUS EPIDERMIDIS SUSCEPTIBILITIES PERFORMED ON PREVIOUS CULTURE WITHIN THE LAST 5 DAYS. Performed at [facility], [place] [address] [place], [place] [address]    Report Status 02/24/2020 FINAL  Final  Culture, blood (Routine X 2) w Reflex to ID Panel     Status: None   Collection Time: 02/17/20  6:26 AM   Specimen: BLOOD RIGHT HAND  Result Value Ref Range Status   Specimen Description BLOOD RIGHT HAND  Final   Special Requests AEROBIC BOTTLE ONLY Blood Culture adequate volume  Final   Culture   Final    NO GROWTH 5 DAYS Performed at [facility], [place] [address], [place], [place] [address]    Report Status 02/22/2020 FINAL  Final  Blood Culture ID Panel (Reflexed)     Status: Abnormal   Collection Time: 02/17/20  6:26 AM  Result Value Ref Range Status   Enterococcus species NOT DETECTED NOT DETECTED Final   Listeria monocytogenes NOT DETECTED NOT DETECTED Final   Staphylococcus species DETECTED (A) NOT DETECTED Final    Comment: Methicillin (oxacillin) resistant coagulase negative staphylococcus. Possible blood culture contaminant (unless isolated from more than one blood culture draw or clinical case suggests pathogenicity). No antibiotic treatment is indicated for blood  culture contaminants. CRITICAL RESULT CALLED TO, READ BACK BY AND VERIFIED WITH: K. HURTH,PHARMD [id number] 02/19/2020 T. TYSOR    Staphylococcus aureus (BCID) NOT DETECTED NOT DETECTED Final   Methicillin resistance DETECTED (A) NOT DETECTED Final    Comment: CRITICAL RESULT CALLED TO, READ BACK BY AND VERIFIED WITH: K. HURTH,PHARMD [id number] 02/19/2020 T. TYSOR    Streptococcus species NOT DETECTED NOT DETECTED Final   Streptococcus agalactiae NOT DETECTED NOT DETECTED Final   Streptococcus pneumoniae NOT DETECTED NOT DETECTED Final   Streptococcus pyogenes NOT DETECTED NOT DETECTED Final   Acinetobacter baumannii NOT DETECTED NOT DETECTED Final   Enterobacteriaceae species NOT  DETECTED NOT DETECTED Final   Enterobacter cloacae complex NOT DETECTED NOT DETECTED Final   Escherichia coli NOT DETECTED NOT DETECTED Final   Klebsiella oxytoca NOT DETECTED NOT DETECTED Final  Klebsiella pneumoniae NOT DETECTED NOT DETECTED Final   Proteus species NOT DETECTED NOT DETECTED Final   Serratia marcescens NOT DETECTED NOT DETECTED Final   Haemophilus influenzae NOT DETECTED NOT DETECTED Final   Neisseria meningitidis NOT DETECTED NOT DETECTED Final   Pseudomonas aeruginosa NOT DETECTED NOT DETECTED Final   Candida albicans NOT DETECTED NOT DETECTED Final   Candida glabrata NOT DETECTED NOT DETECTED Final   Candida krusei NOT DETECTED NOT DETECTED Final   Candida parapsilosis NOT DETECTED NOT DETECTED Final   Candida tropicalis NOT DETECTED NOT DETECTED Final    Comment: Performed at [facility], [place] [address], [place], [place] [address]  Culture, respiratory (non-expectorated)     Status: None   Collection Time: 02/17/20  7:30 AM   Specimen: Tracheal Aspirate; Respiratory  Result Value Ref Range Status   Specimen Description TRACHEAL ASPIRATE  Final   Special Requests Normal  Final   Gram Stain   Final    RARE WBC PRESENT, PREDOMINANTLY PMN NO ORGANISMS SEEN Performed at [name], [place] [address], [place], [place] [address]    Culture RARE CANDIDA PARAPSILOSIS  Final   Report Status 02/20/2020 FINAL  Final  Culture, respiratory (non-expectorated)     Status: None (Preliminary result)   Collection Time: 02/20/20 12:38 PM   Specimen: Tracheal Aspirate; Respiratory  Result Value Ref Range Status   Specimen Description TRACHEAL ASPIRATE  Final   Special Requests NONE  Final   Gram Stain   Final    RARE WBC PRESENT, PREDOMINANTLY PMN RARE SQUAMOUS EPITHELIAL CELLS PRESENT FEW GRAM POSITIVE RODS RARE GRAM POSITIVE COCCI    Culture   Final    ABUNDANT DIPHTHEROIDS(CORYNEBACTERIUM SPECIES) FEW YEAST IDENTIFICATION TO FOLLOW Performed at [facility], [place] [address] [place], [place] [address]    Report Status PENDING  Incomplete  Culture, blood (routine x 2)     Status: Abnormal   Collection Time: 02/20/20 12:50 PM   Specimen: BLOOD LEFT HAND  Result Value Ref Range Status   Specimen Description BLOOD LEFT HAND  Final   Special Requests   Final    BOTTLES DRAWN AEROBIC AND ANAEROBIC Blood Culture adequate volume   Culture  Setup Time   Final    GRAM POSITIVE COCCI IN CHAINS IN BOTH AEROBIC AND ANAEROBIC BOTTLES CRITICAL RESULT CALLED TO, READ BACK BY AND VERIFIED WITH: Jens Som AMEND [id number] FCP Performed at [facility], [place] [address] [place], [place] [address]    Culture (A)  Final    ENTEROCOCCUS FAECALIS STAPHYLOCOCCUS EPIDERMIDIS    Report Status 02/24/2020 FINAL  Final   Organism ID, Bacteria ENTEROCOCCUS FAECALIS  Final   Organism ID, Bacteria STAPHYLOCOCCUS EPIDERMIDIS  Final      Susceptibility   Enterococcus faecalis - MIC*    AMPICILLIN <=2 SENSITIVE Sensitive     VANCOMYCIN 1 SENSITIVE Sensitive     GENTAMICIN SYNERGY RESISTANT Resistant     * ENTEROCOCCUS FAECALIS   Staphylococcus epidermidis - MIC*    CIPROFLOXACIN >=8 RESISTANT Resistant     ERYTHROMYCIN >=8 RESISTANT Resistant     GENTAMICIN >=16 RESISTANT Resistant     OXACILLIN >=4 RESISTANT Resistant     TETRACYCLINE 2 SENSITIVE Sensitive     VANCOMYCIN 2 SENSITIVE Sensitive     TRIMETH/SULFA 160 RESISTANT Resistant     CLINDAMYCIN >=8 RESISTANT Resistant     RIFAMPIN <=0.5 SENSITIVE Sensitive     Inducible Clindamycin NEGATIVE Sensitive     *  STAPHYLOCOCCUS EPIDERMIDIS  Blood Culture ID Panel (Reflexed)     Status: Abnormal   Collection Time: 02/20/20 12:50 PM  Result Value Ref Range Status   Enterococcus species DETECTED (A) NOT DETECTED Final    Comment: CRITICAL RESULT CALLED TO, READ BACK BY AND VERIFIED WITH: PHARMD KAREN AMEND [id number] FCP    Vancomycin resistance NOT DETECTED NOT DETECTED Final   Listeria  monocytogenes NOT DETECTED NOT DETECTED Final   Staphylococcus species DETECTED (A) NOT DETECTED Final    Comment: Methicillin (oxacillin) resistant coagulase negative staphylococcus. Possible blood culture contaminant (unless isolated from more than one blood culture draw or clinical case suggests pathogenicity). No antibiotic treatment is indicated for blood  culture contaminants. CRITICAL RESULT CALLED TO, READ BACK BY AND VERIFIED WITH: PHARMD KAREN AMEND [id number] FCP    Staphylococcus aureus (BCID) NOT DETECTED NOT DETECTED Final   Methicillin resistance DETECTED (A) NOT DETECTED Final    Comment: CRITICAL RESULT CALLED TO, READ BACK BY AND VERIFIED WITH: PHARMD KAREN AMEND [id number] FCP    Streptococcus species NOT DETECTED NOT DETECTED Final   Streptococcus agalactiae NOT DETECTED NOT DETECTED Final   Streptococcus pneumoniae NOT DETECTED NOT DETECTED Final   Streptococcus pyogenes NOT DETECTED NOT DETECTED Final   Acinetobacter baumannii NOT DETECTED NOT DETECTED Final   Enterobacteriaceae species NOT DETECTED NOT DETECTED Final   Enterobacter cloacae complex NOT DETECTED NOT DETECTED Final   Escherichia coli NOT DETECTED NOT DETECTED Final   Klebsiella oxytoca NOT DETECTED NOT DETECTED Final   Klebsiella pneumoniae NOT DETECTED NOT DETECTED Final   Proteus species NOT DETECTED NOT DETECTED Final   Serratia marcescens NOT DETECTED NOT DETECTED Final   Haemophilus influenzae NOT DETECTED NOT DETECTED Final   Neisseria meningitidis NOT DETECTED NOT DETECTED Final   Pseudomonas aeruginosa NOT DETECTED NOT DETECTED Final   Candida albicans NOT DETECTED NOT DETECTED Final   Candida glabrata NOT DETECTED NOT DETECTED Final   Candida krusei NOT DETECTED NOT DETECTED Final   Candida parapsilosis NOT DETECTED NOT DETECTED Final   Candida tropicalis NOT DETECTED NOT DETECTED Final    Comment: Performed at [facility], [place], [place], [place] [address]  Culture,  blood (routine x 2)     Status: None (Preliminary result)   Collection Time: 02/20/20 12:58 PM   Specimen: BLOOD LEFT ARM  Result Value Ref Range Status   Specimen Description BLOOD LEFT ARM  Final   Special Requests   Final    BOTTLES DRAWN AEROBIC AND ANAEROBIC Blood Culture adequate volume   Culture   Final    NO GROWTH 4 DAYS Performed at [facility], [place] [address], [place], [place] [address]    Report Status PENDING  Incomplete  Culture, blood (Routine X 2) w Reflex to ID Panel     Status: None (Preliminary result)   Collection Time: 02/22/20 11:25 AM   Specimen: BLOOD RIGHT HAND  Result Value Ref Range Status   Specimen Description BLOOD RIGHT HAND  Final   Special Requests   Final    BOTTLES DRAWN AEROBIC ONLY Blood Culture adequate volume   Culture   Final    NO GROWTH 2 DAYS Performed at [facility], [place] [address] [place], [place] [address]    Report Status PENDING  Incomplete  Culture, blood (Routine X 2) w Reflex to ID Panel     Status: None (Preliminary result)   Collection Time: 02/22/20 11:25 AM  Specimen: BLOOD RIGHT HAND  Result Value Ref Range Status   Specimen Description BLOOD RIGHT HAND  Final   Special Requests   Final    BOTTLES DRAWN AEROBIC ONLY Blood Culture adequate volume   Culture   Final    NO GROWTH 2 DAYS Performed at [facility], [address] [place], [place] [address]    Report Status PENDING  Incomplete    Coagulation Studies: No results for input(s): LABPROT, INR in the last 72 hours.  Urinalysis: No results for input(s): COLORURINE, LABSPEC, PHURINE, GLUCOSEU, HGBUR, BILIRUBINUR, KETONESUR, PROTEINUR, UROBILINOGEN, NITRITE, LEUKOCYTESUR in the last 72 hours.  Invalid input(s): APPERANCEUR    Imaging: DG Chest 1 View  Result Date: 02/23/2020 CLINICAL DATA:  PICC line EXAM: CHEST  1 VIEW COMPARISON:  02/19/2020, CT chest 12/21/2019 FINDINGS: Tracheostomy tube remains in place. Right-sided central venous  catheter with tip projecting over expected location of SVC allowing for mediastinal shift to the left. No pneumothorax. No peripheral catheter is visualized. Persistent consolidation probable pleural effusion on the left. Improved aeration at the right base. Shift of mediastinal contents to the left consistent with volume loss. Aortic atherosclerosis. IMPRESSION: 1. Insertion of right-sided central venous catheter, the tip projects to the left of the spine however the patient is very rotated and there is shift of mediastinal contents to the left and the tip projects over expected location of SVC allowing for rotation and mediastinal shift. There is no pneumothorax. 2. No extremity catheter is visualized 3. Slightly improved aeration at the right base. Left basilar consolidation and probable effusion, in addition to the remaining pulmonary infiltrates are otherwise unchanged. Electronically Signed   By: Donavan Foil M.D.   On: 02/23/2020 19:07   MR PELVIS WO CONTRAST  Result Date: 02/23/2020 CLINICAL DATA:  Sepsis.  Sacral decubitus ulcer. EXAM: MRI PELVIS WITHOUT CONTRAST TECHNIQUE: Multiplanar multisequence MR imaging of the pelvis was performed. No intravenous contrast was administered. COMPARISON:  CT scan of the chest abdomen and pelvis dated 12/21/2019 FINDINGS: Musculoskeletal: There is abnormal increased T2 signal from the fifth sacral segment. There is fragmentation of the proximal segments of the coccyx. There is patchy decreased signal intensity from the marrow of the bones of the pelvis, probably representing red marrow reactivation. No fractures. There is patchy edema in the muscles around both hips and in both buttocks with subcutaneous edema in the lateral aspects of both hips and in the proximal thighs, left more than right. Urinary Tract: There is a fluid-fluid level in the bladder, possibly representing debris or protein in the bladder. Enlargement of the median lobe of the prostate gland.  Bowel: Ostomy in the left mid abdomen. Left inguinal hernia contains a loop of small bowel. Vascular/Lymphatic: No pathologically enlarged lymph nodes. No significant vascular abnormality seen. Reproductive:  Enlarged median lobe of the prostate gland. Other:  None. IMPRESSION: 1. Osteomyelitis of the fifth sacral segment. 2. Edema in the muscles around both hips and in the proximal thighs, left more than right. This could represent myositis. 3. Left inguinal hernia contains a loop of small bowel. 4. Enlarged median lobe of the prostate gland. Electronically Signed   By: Lorriane Shire M.D.   On: 02/23/2020 17:35   MR SACRUM SI JOINTS WO CONTRAST  Result Date: 02/23/2020 CLINICAL DATA:  Sepsis.  Sacral decubitus ulcer. EXAM: MRI SACRUM WITHOUT CONTRAST TECHNIQUE: Multiplanar, multisequence MR imaging of the sacrum was performed. No intravenous contrast was administered. COMPARISON:  CT scan of the abdomen  and pelvis dated 12/21/2019 FINDINGS: There is abnormal edema in the fifth sacral segment. The proximal segments of the coccyx are in either destroyed or have been previously resected due to the overlying sacral decubitus ulcer. There is diffuse patchy low signal of the marrow of the pelvic bones and lumbar spine consistent with red marrow reactivation. The sacroiliac joints and hip joints are normal. There is edema in the muscles around the hips and in the posterior paraspinal musculature of the lower lumbar spine and in the buttocks which could represent myositis. Fluid-fluid level in the bladder may represent protein or debris in the bladder. Does the patient have findings of a urinary tract infection? Enlarged median lobe of the prostate gland. IMPRESSION: 1. Osteomyelitis of the fifth sacral segment. 2. Edema in the muscles around the hips and in the posterior paraspinal musculature of the lower lumbar spine and in the buttocks which could represent myositis. 3. Fluid-fluid level in the bladder may  represent protein or debris in the bladder. The possibility of urinary tract infection should be considered. Electronically Signed   By: Lorriane Shire M.D.   On: 02/23/2020 17:40   ECHOCARDIOGRAM COMPLETE  Result Date: 02/22/2020    ECHOCARDIOGRAM REPORT   Patient Name:   Shane Banks Date of Exam: 02/22/2020 Medical Rec #:  [id number]    Height:       69.0 in Accession #:    [id number]   Weight:       127.2 lb Date of Birth:  1945-10-09    BSA:          1.705 m Patient Age:    51 years     BP:           104/63 mmHg Patient Gender: M            HR:           77 bpm. Exam Location:  Inpatient Procedure: 2D Echo Indications:    Bacteremia 790.7 / R78.81  History:        Patient has prior history of Echocardiogram examinations, most                 recent 12/19/2019. Arrythmias:Atrial Fibrillation. Sepsis.  Sonographer:    Vikki Ports Turrentine Referring Phys: [id number] [facility] MIK[facility]IL  Sonographer Comments: Echo performed with patient supine and on artificial respirator and no subcostal window. No SSN. Tracheostomy and peg tube in place. IMPRESSIONS  1. Left ventricular ejection fraction, by estimation, is 60 to 65%. The left ventricle has normal function. The left ventricle has no regional wall motion abnormalities. There is severe left ventricular hypertrophy. Left ventricular diastolic parameters  are consistent with Grade I diastolic dysfunction (impaired relaxation).  2. Right ventricular systolic function is normal. The right ventricular size is normal. There is mildly elevated pulmonary artery systolic pressure.  3. Left atrial size was mildly dilated.  4. Right atrial size was mildly dilated.  5. The mitral valve is grossly normal. Trivial mitral valve regurgitation.  6. The aortic valve is tricuspid. Aortic valve regurgitation is trivial. Conclusion(s)/Recommendation(s): No evidence of valvular vegetations on this transthoracic echocardiogram. Would recommend a transesophageal echocardiogram to exclude infective  endocarditis if clinically indicated. FINDINGS  Left Ventricle: Left ventricular ejection fraction, by estimation, is 60 to 65%. The left ventricle has normal function. The left ventricle has no regional wall motion abnormalities. The left ventricular internal cavity size was normal in size. There is  severe left ventricular hypertrophy. Left ventricular diastolic parameters are consistent  with Grade I diastolic dysfunction (impaired relaxation). Indeterminate filling pressures. Right Ventricle: The right ventricular size is normal. No increase in right ventricular wall thickness. Right ventricular systolic function is normal. There is mildly elevated pulmonary artery systolic pressure. The tricuspid regurgitant velocity is 3.00  m/s, and with an assumed right atrial pressure of 8 mmHg, the estimated right ventricular systolic pressure is 27.0 mmHg. Left Atrium: Left atrial size was mildly dilated. Right Atrium: Right atrial size was mildly dilated. Pericardium: Trivial pericardial effusion is present. The pericardial effusion is posterior to the left ventricle. Mitral Valve: The mitral valve is grossly normal. Trivial mitral valve regurgitation. Tricuspid Valve: The tricuspid valve is grossly normal. Tricuspid valve regurgitation is mild. Aortic Valve: The aortic valve is tricuspid. Aortic valve regurgitation is trivial. Pulmonic Valve: The pulmonic valve was grossly normal. Pulmonic valve regurgitation is trivial. Aorta: The aortic root and ascending aorta are structurally normal, with no evidence of dilitation. IAS/Shunts: No atrial level shunt detected by color flow Doppler.  LEFT VENTRICLE PLAX 2D LVIDd:         3.40 cm  Diastology LVIDs:         2.30 cm  LV e' lateral:   6.83 cm/s LV PW:         1.40 cm  LV E/e' lateral: 13.4 LV IVS:        1.40 cm  LV e' medial:    5.93 cm/s LVOT diam:     2.10 cm  LV E/e' medial:  15.4 LV SV:         56 LV SV Index:   33 LVOT Area:     3.46 cm  RIGHT VENTRICLE RV S prime:      11.10 cm/s TAPSE (M-mode): 1.4 cm LEFT ATRIUM           Index       RIGHT ATRIUM           Index LA diam:      3.50 cm 2.05 cm/m  RA Area:     20.80 cm LA Vol (A2C): 64.3 ml 37.72 ml/m RA Volume:   59.20 ml  34.73 ml/m LA Vol (A4C): 32.1 ml 18.83 ml/m  AORTIC VALVE LVOT Vmax:   102.00 cm/s LVOT Vmean:  72.600 cm/s LVOT VTI:    0.163 m  AORTA Ao Root diam: 4.00 cm MITRAL VALVE               TRICUSPID VALVE MV Area (PHT): 4.63 cm    TR Peak grad:   36.0 mmHg MV Decel Time: 164 msec    TR Vmax:        300.00 cm/s MV E velocity: 91.30 cm/s MV A velocity: 67.70 cm/s  SHUNTS MV E/A ratio:  1.35        Systemic VTI:  0.16 m                            Systemic Diam: 2.10 cm Lyman Bishop MD Electronically signed by Lyman Bishop MD Signature Date/Time: 02/22/2020/12:03:41 PM    Final      Medications:   . sodium chloride    . cefTRIAXone (ROCEPHIN)  IV 2 g (02/23/20 1920)  . feeding supplement (NEPRO CARB STEADY) 1,000 mL (02/23/20 1814)   . chlorhexidine gluconate (MEDLINE KIT)  15 mL Mouth Rinse BID  . Chlorhexidine Gluconate Cloth  6 each Topical Q0600  . diltiazem  30 mg Per Tube Q8H  .  docusate  100 mg Per Tube BID  . feeding supplement (PRO-STAT SUGAR FREE 64)  30 mL Per Tube BID  . insulin aspart  0-9 Units Subcutaneous Q4H  . mouth rinse  15 mL Mouth Rinse 10 times per day  . metroNIDAZOLE  500 mg Per Tube Q8H  . pantoprazole (PROTONIX) IV  40 mg Intravenous Q24H  . polyethylene glycol  17 g Per Tube Daily  . vancomycin variable dose per unstable renal function (pharmacist dosing)   Does not apply See admin instructions   sodium chloride, acetaminophen (TYLENOL) oral liquid 160 mg/5 mL, alteplase, fentaNYL (SUBLIMAZE) injection, fentaNYL (SUBLIMAZE) injection, heparin, heparin, lidocaine (PF), lidocaine-prilocaine, pentafluoroprop-tetrafluoroeth  Assessment/ Plan:  1. Hemoptysis - in pt w/ trach at [facility], admitted here for bleeding from trach site on 5/19. Seems to be resolved.   Seems  to be ulceration of stoma 2. Acute/chronic resp failure - sp COVID pna earlier this year, also course at [facility] complicated by multiple infections/ PNA. Sp trach. Vancomycin restarted. 02/21/2019 reculturing due to fever.  Enterococcus/MRSA positive for blood cultures.  Dialysis catheter removed 02/21/2020.  Appreciate assistance from Dr. Baxter Flattery.  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  Recommending sacral biopsy.  Continues on Rocephin and Flagyl.  As well as vancomycin.  Repeat blood cultures ordered 3. 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  Right IJ TDC removed.  Patient was receiving dialysis Monday Wednesday Friday.  Received dialysis 02/21/2020 with removal of 1 L.  Currently line holiday next dialysis will be planned for Saturday, 02/24/2020. 4. BP/ volume excess - 1 L removed 02/21/2020 5. AMS -mental status close to baseline 6. Atrial fib - per primary Eliquis and Cardizem for rate control 7. Anemia ckd / abl -status post transfusion 2 packs unit blood cells 8. Nutrition - on PEG feeds, +diverting colostomy 9. Sacral decub - per primary, WOC        LOS: [place] _0 _1 :20 AM

## 2020-02-24 NOTE — Progress Notes (Addendum)
NAME:  Keymani Mclean, MRN:  [id number], DOB:  1945-10-08, LOS: 69 ADMISSION DATE:  02/14/2020, CONSULTATION DATE:  5/19 REFERRING MD:  Dr. Alvino Chapel, CHIEF COMPLAINT:  Hemoptysis   Brief History   74 year old male. COVID ARDS in January. Unfortunately required trach and LTACH placement. Had been tolerating trach collar, but on 5/19 he developed hemoptysis and hypoxemic respiratory failure requiring vent.   History of present illness   74 year old male with past medical history as below, which is significant for Atrial fibrillation and stroke. He was admitted to [facility] for [place] 19 pneumonia, which unfortunately progressed to ARDS.  It seems as though his highest vent settings were 60% FiO2 and 8 of PEEP.  He had very little improvement in his ventilatory and oxygenation use over the course of his hospitalization and ultimately underwent tracheostomy and [facility] [place] [facility]. His course at [facility] was complicated by ongoing infections including UTI, MDRO pseudomonas pneumonia, and stenotrophomonas bacteremia. As a result of sepsis and shock he developed renal failure requiring dialysis, which he now receives M/W/F. In the morning hours oh 5/19 he developed hemoptysis. He had previously been taken off anticoagulation (for AF) due to anemia with positive hemoccult. Transferred to [facility] ED for evaluation of hemoptysis. Prior to the transfer he had been tolerating 28% ATC.   Past Medical History   has a past medical history of Acute on chronic respiratory failure with hypoxia ([place]), Atrial fibrillation with RVR ([place]), BPH (benign prostatic hyperplasia), COVID-19 virus infection, Pneumonia due to COVID-19 virus, Severe sepsis ([place]), and Stroke ([place]).   Significant Hospital Events   1/8  Admit to [facility] for COVID PNA 2/23 Transfer to [facility] on vent 5/19 Transfer to [facility] for hemoptysis. #6 cuffed trach placed. Back on  vent. Bronch performed 5/20 Transitioned to trach collar.  5/22 back on vent full time 5/24 back on antibiotics for fevers  Consults:  ENT nephro ID  Procedures:  ENT flex scope 5/19 > no bleeding source identified FOB 5/19 > no pulmonary bleeding source identified.   Significant Diagnostic Tests:  CXR 5/28 >> R CVC placement confirmed Slightly improved aeration at the right base. 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 Edema in the muscles around the hips and in the posterior paraspinal musculature of the lower lumbar spine and in the buttocks which could represent myositis. Fluid-fluid level in the bladder may represent protein or debris in the bladder. The possibility of urinary tract infection should be considered.  Micro Data:  BAL 5/19 > no growth 5/19 blood > NG 5/22 trach aspirate>> candida [place] 5/22 blood>> 1/4 GPC> staph epi 5/25 blood>> GPC (enterococcus on biofire) 2/4 bottles>> E. Faecalis, staph epi. 5/25 trach aspirate>>many PMN, few GPR, rare GPC> diphtheroids 5/27 blood cx>> + for Enterococcus Faecalis/ Staphylococcus Epidermidis  Of note tracheal aspirate 5/7 + for Pseudomonas AERUGINOSA   Antimicrobials:  Levofloxacin 5/24>5/26 vanc 5/25> Ceftriaxone  5/27> Metronidazole 5/27>   Interim history/subjective:  States he is doing ok.  WBC is 20,000  T Max is 101.5 + 3.4 L No weaning 5/29  Objective   Blood pressure (!) 107/59, pulse (!) 106, temperature 100.3 F (37.9 C), temperature source Axillary, resp. rate (!) 28, height _0  (1.753 m), weight 57.7 kg, SpO2 100 %.  Vent Mode: PRVC FiO2 (%):  [30 %-40 %] 40 % Set Rate:  [16 bmp] 16 bmp Vt Set:  [480 mL] 480 mL PEEP:  [5 cmH20] 5 cmH20 Plateau Pressure:  [30 cmH20-34 cmH20] 32 cmH20   Intake/Output Summary (Last  24 hours) at 02/24/2020 5053 Last data filed at 02/24/2020 0600 Gross per 24 hour  Intake 444.5 ml  Output 0 ml  Net 444.5 ml   Filed Weights   02/21/20 0500 02/21/20 0900 02/22/20 0500  Weight: 59.9 kg 59.3 kg 57.7 kg    Examination:   Gen:      Frail elderly man in NAD, appears stated age 16:  /AT, eyes anicteric. Temporal wasting. Neck:     Trach in place and secure  without bleeding, slight JVD Lungs:    tachypneic on the vent, faint rhonchi bilaterally, diminished per bases CV:         S1, S2, Irr, mild tachycardia, a fib per monitor Abd:      Thin, soft, NT, ND. PEG without erythema, ostomy pink with brown liquid stool noted Ext:    Minimal muscle mass, no edema, no obvious deformities Skin:       Warm, dry Neuro:     Awake, globally weak, deconditioned,  nodding to answer questions   Resolved Hospital Problem list     Assessment & Plan:   Acute on chronic hypoxemic respiratory failure requiring tracheostomy, prolonged MV: he has a trach s/p COVID pneumonia earlier this year. His oxygenation had  improved to tolerate 28% ATC, but has been back on the vent for several days. Course since complicated by multiple pneumonias, which is seems he has complete tx for (MDRO pseudomonas treated with inhaled tobramycin). Now he has had hemoptysis and aspirated blood. Decompensated in ER in this setting. Hemoptysis seems to be originating from stoma. Ulceration?  Has a cuffed trach placed by ENT.  No active bleeding from the blood on bronchoscopy New LUL pneumonia- diphtheroids on cx.  Plan -Con't full vent support. Restarting PS+ CPAP trials on PS 15- remains  tachypneic , -Con't vanc, flagyl,ceftriaxone -VAP prevention protocol. 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  -IR consult for bone biopsy for culture- had MRI  today -con't ceftriaxone, vanc, and flagyl ( Covers for gram - OM) -line holiday- new line placed today for HD in AM -con't to follow cultures  Chronic anemia - 2/2 ESRD, critical illness. S/p pRBC transfusion. -con't to monitor -tranfuse for Hb <7 or hemodynamically significant bleeding  ESRD on HD  -dialysis per nephro- next due 5/29 ( Today) -renally dose meds -strict I/os -line replaced today -renal TF for hyperkalemia - Trend BMET  - Replete electrolytes as needed  Paroxysmal atrial fibrillation: sinus on telemetry today. Amiodarone stopped at [facility] due to bradycardia, anticoagulation stopped due to suspected GIB with several transfusions.  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 Minimal insulin needs. -con't SSI PRN -accuchecks Q4h -goal BG 140-180 while admitted to ICU  Best practice:  Diet: TF Pain/Anxiety/Delirium protocol (if indicated): PRN sedation for vent complaince  VAP protocol (if indicated): Per protocol DVT prophylaxis: eliquis low- dose GI prophylaxis: PPI Glucose control: SSI Mobility: BR Code Status: DNR Family Communication: Sister updated 5/29 Disposition: ICU   This patient is critically ill with multiple organ system failure which requires frequent high complexity decision making, assessment, support, evaluation, and titration of therapies. This was completed through the application of advanced monitoring technologies and extensive interpretation of multiple databases. During this encounter critical care time was devoted to patient care services described in this note for 35 minutes.  Magdalen Spatz, MSN, AGACNP-BC [place] for personal pager PCCM on call pager [phone]  02/24/20 9:24 AM

## 2020-02-24 NOTE — Progress Notes (Signed)
Notified  Ben, RN  At 1200 that HD orders have been modified for 02/25/2020, per Dr Justin Mend

## 2020-02-24 NOTE — Progress Notes (Signed)
PROGRESS NOTE    Shane Banks  GMW:[id number] DOB: 08/25/1946 DOA: 02/14/2020 PCP: [name], MD   Brief Narrative:  HPI On 02/14/2020 by [name], NP (PCCM) 74 year old male with past medical history as below, which is significant for Atrial fibrillation and stroke. He was admitted to [facility] for [place] 19 pneumonia, which unfortunately progressed to ARDS.  It seems as though his highest vent settings were 60% FiO2 and 8 of PEEP.  He had very little improvement in his ventilatory and oxygenation use over the course of his hospitalization and ultimately underwent tracheostomy and was [facility] [place] on February 23. His course at [facility] was complicated by ongoing infections including UTI, MDRO pseudomonas pneumonia, and stenotrophomonas bacteremia. As a result of sepsis and shock he developed renal failure requiring dialysis, which he now receives M/W/F. In the morning hours oh 5/19 he developed hemoptysis. He had previously been taken off anticoagulation (for AF) due to anemia with positive hemoccult. Transferred to [facility] for evaluation of hemoptysis. Prior to the transfer he had been tolerating 28% ATC.  Assessment & Plan   Acute on chronic hypoxemic respiratory failure -Trach status post Covid infection earlier this year -Oxygenation had improved and patient was able to tolerate 28% ATC however now back on the vent for several days and has been intolerant of the weaning process -He has had multiple pneumonias of different organisms and has been treated with multiple antibiotics including Fortaz, Merrem, cefepime, Toprol mycin, vancomycin, azithromycin, ceftriaxone, tigecycline, avycaz, merrem, flagyl.  -Most recently pneumonia has been MDRO Pseudomonas for which she was treated with Avycaz and mycin [facility]bulizers [facility] -He likely has ongoing aspiration -Now with hemoptysis and aspirated blood.  Per critical care note also seems  to be originating from stoma.  Question ulceration.  Patient has cuffed trach placed by ENT.  No active bleeding on bronchoscopy.  Atrial fibrillation, paroxysmal -Wound discontinued due to bradycardia.  Anticoagulation discontinued due to suspected GI bleed with several transfusions. 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 -Chest x-ray shows persistent bilateral lower lobe infiltrates with small bilateral pleural effusions -High suspicion of ongoing aspiration -Blood culture 02/20/2020 showed Enterococcus faecalis, Staphylococcus epidermidis -Patient was started on Levaquin, but this was discontinued -Now on vancomycin with HD -Infectious disease consulted and appreciated -Discussed biopsy with surgery, they recommended IR  -Interventional radiology consulted and appreciated-commended MRI -Echocardiogram EF 44-03%, grade 1 diastolic dysfunction -MRI pelvis/sacrum showed osteomyelitis of the fifth sacral segment.  Edema in the muscle around both hips and proximal thighs, left more than right, could represent myositis.  Left inguinal hernia contains loops of small bowel. -Recommending continuing vancomycin, ceftriaxone and Flagyl  End-stage renal disease -Nephrology consulted and appreciated -Continue hemodialysis -Currently patient has a line holiday, with next HD planned for today 02/24/2020  Chronic anemia -Likely secondary to ESRD, critical illness -Status post blood transfusion -Continue to monitor  Sacral pressure ulcer-osteomyelitis -Unstageable -Wound care consulted -Discussion above  Cachexia, severe protein malnutrition -Tube feeds via PEG  DVT Prophylaxis  SCDs  Code Status: DNR  Family Communication: None at bedside  Disposition Plan:  Status is: Inpatient  Remains inpatient appropriate because:IV treatments appropriate due to intensity of illness or  inability to take PO   [facility]: [facility]  Anticipated d/c is to: TBD              Anticipated d/c date is: > 3 days              Patient currently is not medically stable to d/c.   Consultants PCCM Nephrology ENT Infectious disease  Procedures  ENT Flex scope FOB  Antibiotics   Anti-infectives (From admission, onward)   Start     Dose/Rate Route Frequency Ordered Stop   02/22/20 2200  metroNIDAZOLE (FLAGYL) tablet 500 mg     500 mg Per Tube Every 8 hours 02/22/20 0957     02/22/20 1700  cefTRIAXone (ROCEPHIN) 2 g in sodium chloride 0.9 % 100 mL IVPB     2 g 200 mL/hr over 30 Minutes Intravenous Every 24 hours 02/22/20 0957     02/22/20 1200  vancomycin (VANCOCIN) IVPB 500 mg/100 ml premix  Status:  Discontinued     500 mg 100 mL/hr over 60 Minutes Intravenous Every T-Th-Sa (Hemodialysis) 02/20/20 1253 02/21/20 1040   02/21/20 1431  vancomycin variable dose per unstable renal function (pharmacist dosing)      Does not apply See admin instructions 02/21/20 1431     02/21/20 1200  vancomycin (VANCOREADY) IVPB 500 mg/100 mL     500 mg 100 mL/hr over 60 Minutes Intravenous  Once 02/21/20 0956 02/21/20 1253   02/21/20 0714  vancomycin (VANCOCIN) 500-5 MG/100ML-% IVPB  Status:  Discontinued    Note to Pharmacy: Ashley Akin   : cabinet override      02/21/20 0714 02/21/20 1346   02/20/20 1400  vancomycin (VANCOREADY) IVPB 1250 mg/250 mL     1,250 mg 166.7 mL/hr over 90 Minutes Intravenous  Once 02/20/20 1253 02/20/20 1538   02/20/20 1200  levofloxacin (LEVAQUIN) IVPB 500 mg  Status:  Discontinued     500 mg 100 mL/hr over 60 Minutes Intravenous Every 48 hours 02/20/20 1020 02/21/20 0943   02/15/20 1545  vancomycin (VANCOCIN) IVPB 1000 mg/200 mL premix  Status:  Discontinued     1,000 mg 200 mL/hr over 60 Minutes Intravenous Every 24 hours 02/14/20 1530 02/14/20 1745   02/15/20 0330  ceFEPIme (MAXIPIME) 2 g in sodium chloride 0.9 % 100 mL IVPB   Status:  Discontinued     2 g 200 mL/hr over 30 Minutes Intravenous Every 12 hours 02/14/20 1531 02/14/20 1531   02/14/20 1531  ceFEPIme (MAXIPIME) 2 g in sodium chloride 0.9 % 100 mL IVPB  Status:  Discontinued     2 g 200 mL/hr over 30 Minutes Intravenous Every 12 hours 02/14/20 1531 02/14/20 1745   02/14/20 1530  ceFEPIme (MAXIPIME) 2 g in sodium chloride 0.9 % 100 mL IVPB  Status:  Discontinued     2 g 200 mL/hr over 30 Minutes Intravenous  Once 02/14/20 1520 02/14/20 1531   02/14/20 1530  vancomycin (VANCOREADY) IVPB 1500 mg/300 mL  Status:  Discontinued     1,500 mg 150 mL/hr over 120 Minutes Intravenous  Once 02/14/20 1530 02/14/20 1745      Subjective:   Shane Banks seen and examined today.  Patient has no complaints today.  (Minimally conversant) Objective:   Vitals:   02/24/20 0500 02/24/20 0532 02/24/20 0600 02/24/20 0736  BP: 97/61 97/61 (!) 107/59   Pulse: (!) 104  (!) 106   Resp: (!) 35  (!) 28   Temp:    100.3 F (37.9 C)  TempSrc:    Axillary  SpO2: 100%  100%   Weight:      Height:        Intake/Output Summary (Last 24 hours) at 02/24/2020 1009 Last data filed at 02/24/2020 0600 Gross per 24 hour  Intake 444.5 ml  Output 0 ml  Net 444.5 ml   Filed Weights   02/21/20 0500 02/21/20 0900 02/22/20 0500  Weight: 59.9 kg 59.3 kg 57.7 kg   Exam  General: Well developed, chronically ill-appearing, NAD  HEENT: NCAT, mucous membranes moist.   Neck: trach  Cardiovascular: S1 S2 auscultated  Respiratory: Currently on vent, diminished breath sounds at the bases  Abdomen: Soft, nontender, nondistended, + bowel sounds, peg  Extremities: warm dry without cyanosis clubbing or edema  Neuro: Awake, alert, and answers questions and follows commands, very weak  Psych: Appropriate   Data Reviewed: I have personally reviewed following labs and imaging studies  CBC: Recent Labs  Lab 02/20/20 1455 02/21/20 0252 02/22/20 0839 02/23/20 0258  02/24/20 0742  WBC 22.1* 18.9* 19.7* 16.2* 20.9*  HGB 9.8* 9.4* 9.5* 9.3* 9.3*  HCT 30.8* 29.5* 30.2* 29.2* 29.3*  MCV 84.4 82.9 83.7 83.2 81.6  PLT 193 185 202 217 675   Basic Metabolic Panel: Recent Labs  Lab 02/20/20 1455 02/21/20 0540 02/22/20 0839 02/23/20 0258 02/24/20 0742  NA 139 141 137 139 139  K 6.0* 5.6* 4.2 4.2 4.6  CL 98 100 95* 99 99  CO2 _0 19*  GLUCOSE 208* 189* 182* 142* 182*  BUN 76* 96* 62* 91* 128*  CREATININE 3.20* 3.80* 2.61* 3.34* 4.62*  CALCIUM 9.8 9.9 10.0 10.1 10.3  PHOS 5.0*  --   --   --   --    GFR: Estimated Creatinine Clearance: 11.8 mL/min (A) (by C-G formula based on SCr of 4.62 mg/dL (H)). Liver Function Tests: Recent Labs  Lab 02/20/20 1455  ALBUMIN 2.6*   No results for input(s): LIPASE, AMYLASE in the last 168 hours. No results for input(s): AMMONIA in the last 168 hours. Coagulation Profile: No results for input(s): INR, PROTIME in the last 168 hours. Cardiac Enzymes: No results for input(s): CKTOTAL, CKMB, CKMBINDEX, TROPONINI in the last 168 hours. BNP (last 3 results) No results for input(s): PROBNP in the last 8760 hours. HbA1C: No results for input(s): HGBA1C in the last 72 hours. CBG: Recent Labs  Lab 02/23/20 1705 02/23/20 1940 02/23/20 2323 02/24/20 0344 02/24/20 0740  GLUCAP 116* 126* 144* 156* 166*   Lipid Profile: No results for input(s): CHOL, HDL, LDLCALC, TRIG, CHOLHDL, LDLDIRECT in the last 72 hours. Thyroid Function Tests: No results for input(s): TSH, T4TOTAL, FREET4, T3FREE, THYROIDAB in the last 72 hours. Anemia Panel: No results for input(s): VITAMINB12, FOLATE, FERRITIN, TIBC, IRON, RETICCTPCT in the last 72 hours. Urine analysis:    Component Value Date/Time   COLORURINE YELLOW 12/11/2019 1230   APPEARANCEUR HAZY (A) 12/11/2019 1230   LABSPEC 1.018 12/11/2019 1230   PHURINE 6.0 12/11/2019 1230   GLUCOSEU NEGATIVE 12/11/2019 1230   HGBUR NEGATIVE 12/11/2019 1230   BILIRUBINUR  NEGATIVE 12/11/2019 1230   KETONESUR NEGATIVE 12/11/2019 1230   PROTEINUR 30 (A) 12/11/2019 1230   NITRITE NEGATIVE 12/11/2019 1230   LEUKOCYTESUR LARGE (A) 12/11/2019 1230   Sepsis Labs: _1 (procalcitonin:4,lacticidven:4)  ) Recent Results (from the past 240 hour(s))  Culture, blood (routine x 2)     Status: None   Collection Time: 02/14/20  2:55 PM   Specimen: BLOOD RIGHT WRIST  Result Value Ref Range Status   Specimen  Description BLOOD RIGHT WRIST  Final   Special Requests   Final    BOTTLES DRAWN AEROBIC AND ANAEROBIC Blood Culture results may not be optimal due to an excessive volume of blood received in culture bottles   Culture   Final    NO GROWTH 5 DAYS Performed at [facility], [place] [address], [place], [place] [address]    Report Status 02/19/2020 FINAL  Final  SARS Coronavirus 2 by RT PCR (hospital order, performed in [facility] hospital lab) Nasopharyngeal Nasopharyngeal Swab     Status: None   Collection Time: 02/14/20  3:00 PM   Specimen: Nasopharyngeal Swab  Result Value Ref Range Status   SARS Coronavirus 2 NEGATIVE NEGATIVE Final    Comment: (NOTE) SARS-CoV-2 target nucleic acids are NOT DETECTED. The SARS-CoV-2 RNA is generally detectable in upper and lower respiratory specimens during the acute phase of infection. The lowest concentration of SARS-CoV-2 viral copies this assay can detect is 250 copies / mL. A negative result does not preclude SARS-CoV-2 infection and should not be used as the sole basis for treatment or other patient management decisions.  A negative result may occur with improper specimen collection / handling, submission of specimen other than nasopharyngeal swab, presence of viral mutation(s) within the areas targeted by this assay, and inadequate number of viral copies (<250 copies / mL). A negative result must be combined with clinical observations, patient history, and epidemiological information. Fact Sheet for  Patients:   StrictlyIdeas.no Fact Sheet for Healthcare Providers: BankingDealers.co.za This test is not yet approved or cleared  by the [place] FDA and has been authorized for detection and/or diagnosis of SARS-CoV-2 by FDA under an Emergency Use Authorization (EUA).  This EUA will remain in effect (meaning this test can be used) for the duration of the COVID-19 declaration under Section 564(b)(1) of the Act, 21 U.S.C. section 360bbb-3(b)(1), unless the authorization is terminated or revoked sooner. Performed at [facility], [place] [address] [place], [place] [address]   Culture, bal-quantitative     Status: None   Collection Time: 02/14/20  5:17 PM   Specimen: Bronchoalveolar Lavage; Respiratory  Result Value Ref Range Status   Specimen Description BRONCHIAL ALVEOLAR LAVAGE  Final   Special Requests Normal  Final   Gram Stain   Final    RARE WBC PRESENT,BOTH PMN AND MONONUCLEAR NO ORGANISMS SEEN    Culture   Final    NO GROWTH 2 DAYS Performed at [facility], [address] [place], [place] [address]    Report Status 02/17/2020 FINAL  Final  Culture, blood (routine x 2)     Status: None   Collection Time: 02/14/20  7:20 PM   Specimen: BLOOD  Result Value Ref Range Status   Specimen Description BLOOD RIGHT ANTECUBITAL  Final   Special Requests   Final    BOTTLES DRAWN AEROBIC AND ANAEROBIC Blood Culture adequate volume   Culture   Final    NO GROWTH 5 DAYS Performed at [facility], [place] [address], [place], [place] [address]    Report Status 02/19/2020 FINAL  Final  MRSA PCR Screening     Status: None   Collection Time: 02/16/20  7:30 PM   Specimen: Nasal Mucosa; Nasopharyngeal  Result Value Ref Range Status   MRSA by PCR NEGATIVE NEGATIVE Final    Comment:        The GeneXpert MRSA Assay (FDA approved for NASAL specimens only), is  one component of a comprehensive MRSA colonization surveillance  program. It is not intended to diagnose MRSA infection nor to guide or monitor treatment for MRSA infections. Performed at [facility], [place] [address], [place], [place] [address]   Culture, blood (Routine X 2) w Reflex to ID Panel     Status: Abnormal   Collection Time: 02/17/20  6:26 AM   Specimen: BLOOD RIGHT WRIST  Result Value Ref Range Status   Specimen Description BLOOD RIGHT WRIST  Final   Special Requests AEROBIC BOTTLE ONLY Blood Culture adequate volume  Final   Culture  Setup Time   Final    GRAM POSITIVE COCCI IN CLUSTERS AEROBIC BOTTLE ONLY CRITICAL RESULT CALLED TO, READ BACK BY AND VERIFIED WITH: K. HURTH,PHARMD 0867 02/19/2020 T. TYSOR    Culture (A)  Final    STAPHYLOCOCCUS EPIDERMIDIS SUSCEPTIBILITIES PERFORMED ON PREVIOUS CULTURE WITHIN THE LAST 5 DAYS. Performed at [facility], [place] [address], [place], [place] [address]    Report Status 02/24/2020 FINAL  Final  Culture, blood (Routine X 2) w Reflex to ID Panel     Status: None   Collection Time: 02/17/20  6:26 AM   Specimen: BLOOD RIGHT HAND  Result Value Ref Range Status   Specimen Description BLOOD RIGHT HAND  Final   Special Requests AEROBIC BOTTLE ONLY Blood Culture adequate volume  Final   Culture   Final    NO GROWTH 5 DAYS Performed at [facility], [place] [address], [place],  [address]    Report Status 02/22/2020 FINAL  Final  Blood Culture ID Panel (Reflexed)     Status: Abnormal   Collection Time: 02/17/20  6:26 AM  Result Value Ref Range Status   Enterococcus species NOT DETECTED NOT DETECTED Final   Listeria monocytogenes NOT DETECTED NOT DETECTED Final   Staphylococcus species DETECTED (A) NOT DETECTED Final    Comment: Methicillin (oxacillin) resistant coagulase negative staphylococcus. Possible blood culture contaminant (unless isolated from more than one blood culture draw or clinical case suggests pathogenicity). No antibiotic treatment is indicated for blood   culture contaminants. CRITICAL RESULT CALLED TO, READ BACK BY AND VERIFIED WITH: K. HURTH,PHARMD 1245 02/19/2020 T. TYSOR    Staphylococcus aureus (BCID) NOT DETECTED NOT DETECTED Final   Methicillin resistance DETECTED (A) NOT DETECTED Final    Comment: CRITICAL RESULT CALLED TO, READ BACK BY AND VERIFIED WITH: K. HURTH,PHARMD [id number] 02/19/2020 T. TYSOR    Streptococcus species NOT DETECTED NOT DETECTED Final   Streptococcus agalactiae NOT DETECTED NOT DETECTED Final   Streptococcus pneumoniae NOT DETECTED NOT DETECTED Final   Streptococcus pyogenes NOT DETECTED NOT DETECTED Final   Acinetobacter baumannii NOT DETECTED NOT DETECTED Final   Enterobacteriaceae species NOT DETECTED NOT DETECTED Final   Enterobacter cloacae complex NOT DETECTED NOT DETECTED Final   Escherichia coli NOT DETECTED NOT DETECTED Final   Klebsiella oxytoca NOT DETECTED NOT DETECTED Final   Klebsiella pneumoniae NOT DETECTED NOT DETECTED Final   Proteus species NOT DETECTED NOT DETECTED Final   Serratia marcescens NOT DETECTED NOT DETECTED Final   Haemophilus influenzae NOT DETECTED NOT DETECTED Final   Neisseria meningitidis NOT DETECTED NOT DETECTED Final   Pseudomonas aeruginosa NOT DETECTED NOT DETECTED Final   Candida albicans NOT DETECTED NOT DETECTED Final   Candida glabrata NOT DETECTED NOT DETECTED Final   Candida krusei NOT DETECTED NOT DETECTED Final   Candida parapsilosis NOT DETECTED NOT DETECTED Final   Candida tropicalis NOT  DETECTED NOT DETECTED Final    Comment: Performed at [facility], [place] [address] [place], [place] [address]  Culture, respiratory (non-expectorated)     Status: None   Collection Time: 02/17/20  7:30 AM   Specimen: Tracheal Aspirate; Respiratory  Result Value Ref Range Status   Specimen Description TRACHEAL ASPIRATE  Final   Special Requests Normal  Final   Gram Stain   Final    RARE WBC PRESENT, PREDOMINANTLY PMN NO ORGANISMS SEEN Performed at [facility], [place] [address], [place], [place] [address]    Culture RARE CANDIDA PARAPSILOSIS  Final   Report Status 02/20/2020 FINAL  Final  Culture, respiratory (non-expectorated)     Status: None   Collection Time: 02/20/20 12:38 PM   Specimen: Tracheal Aspirate; Respiratory  Result Value Ref Range Status   Specimen Description TRACHEAL ASPIRATE  Final   Special Requests NONE  Final   Gram Stain   Final    RARE WBC PRESENT, PREDOMINANTLY PMN RARE SQUAMOUS EPITHELIAL CELLS PRESENT FEW GRAM POSITIVE RODS RARE GRAM POSITIVE COCCI Performed at [facility], [place] [address] [place], [place] [address]    Culture   Final    ABUNDANT CORYNEBACTERIUM STRIATUM Standardized susceptibility testing for this organism is not available. FEW CANDIDA PARAPSILOSIS    Report Status 02/24/2020 FINAL  Final  Culture, blood (routine x 2)     Status: Abnormal   Collection Time: 02/20/20 12:50 PM   Specimen: BLOOD LEFT HAND  Result Value Ref Range Status   Specimen Description BLOOD LEFT HAND  Final   Special Requests   Final    BOTTLES DRAWN AEROBIC AND ANAEROBIC Blood Culture adequate volume   Culture  Setup Time   Final    GRAM POSITIVE COCCI IN CHAINS IN BOTH AEROBIC AND ANAEROBIC BOTTLES CRITICAL RESULT CALLED TO, READ BACK BY AND VERIFIED WITH: Jens Som AMEND [id number] FCP Performed at [facility], [place] [address], [place], [place] [address]    Culture (A)  Final    ENTEROCOCCUS FAECALIS STAPHYLOCOCCUS EPIDERMIDIS    Report Status 02/24/2020 FINAL  Final   Organism ID, Bacteria ENTEROCOCCUS FAECALIS  Final   Organism ID, Bacteria STAPHYLOCOCCUS EPIDERMIDIS  Final      Susceptibility   Enterococcus faecalis - MIC*    AMPICILLIN <=2 SENSITIVE Sensitive     VANCOMYCIN 1 SENSITIVE Sensitive     GENTAMICIN SYNERGY RESISTANT Resistant     * ENTEROCOCCUS FAECALIS   Staphylococcus epidermidis - MIC*    CIPROFLOXACIN >=8 RESISTANT Resistant     ERYTHROMYCIN >=8 RESISTANT  Resistant     GENTAMICIN >=16 RESISTANT Resistant     OXACILLIN >=4 RESISTANT Resistant     TETRACYCLINE 2 SENSITIVE Sensitive     VANCOMYCIN 2 SENSITIVE Sensitive     TRIMETH/SULFA 160 RESISTANT Resistant     CLINDAMYCIN >=8 RESISTANT Resistant     RIFAMPIN <=0.5 SENSITIVE Sensitive     Inducible Clindamycin NEGATIVE Sensitive     * STAPHYLOCOCCUS EPIDERMIDIS  Blood Culture ID Panel (Reflexed)     Status: Abnormal   Collection Time: 02/20/20 12:50 PM  Result Value Ref Range Status   Enterococcus species DETECTED (A) NOT DETECTED Final    Comment: CRITICAL RESULT CALLED TO, READ BACK BY AND VERIFIED WITH: PHARMD KAREN AMEND [id number] FCP    Vancomycin resistance NOT DETECTED NOT DETECTED Final   Listeria monocytogenes NOT DETECTED NOT DETECTED Final   Staphylococcus species DETECTED (A)  NOT DETECTED Final    Comment: Methicillin (oxacillin) resistant coagulase negative staphylococcus. Possible blood culture contaminant (unless isolated from more than one blood culture draw or clinical case suggests pathogenicity). No antibiotic treatment is indicated for blood  culture contaminants. CRITICAL RESULT CALLED TO, READ BACK BY AND VERIFIED WITH: PHARMD KAREN AMEND [id number] FCP    Staphylococcus aureus (BCID) NOT DETECTED NOT DETECTED Final   Methicillin resistance DETECTED (A) NOT DETECTED Final    Comment: CRITICAL RESULT CALLED TO, READ BACK BY AND VERIFIED WITH: PHARMD KAREN AMEND [id number] FCP    Streptococcus species NOT DETECTED NOT DETECTED Final   Streptococcus agalactiae NOT DETECTED NOT DETECTED Final   Streptococcus pneumoniae NOT DETECTED NOT DETECTED Final   Streptococcus pyogenes NOT DETECTED NOT DETECTED Final   Acinetobacter baumannii NOT DETECTED NOT DETECTED Final   Enterobacteriaceae species NOT DETECTED NOT DETECTED Final   Enterobacter cloacae complex NOT DETECTED NOT DETECTED Final   Escherichia coli NOT DETECTED NOT DETECTED Final   Klebsiella oxytoca  NOT DETECTED NOT DETECTED Final   Klebsiella pneumoniae NOT DETECTED NOT DETECTED Final   Proteus species NOT DETECTED NOT DETECTED Final   Serratia marcescens NOT DETECTED NOT DETECTED Final   Haemophilus influenzae NOT DETECTED NOT DETECTED Final   Neisseria meningitidis NOT DETECTED NOT DETECTED Final   Pseudomonas aeruginosa NOT DETECTED NOT DETECTED Final   Candida albicans NOT DETECTED NOT DETECTED Final   Candida glabrata NOT DETECTED NOT DETECTED Final   Candida krusei NOT DETECTED NOT DETECTED Final   Candida parapsilosis NOT DETECTED NOT DETECTED Final   Candida tropicalis NOT DETECTED NOT DETECTED Final    Comment: Performed at [facility], [place], [place], [place] [address]  Culture, blood (routine x 2)     Status: None (Preliminary result)   Collection Time: 02/20/20 12:58 PM   Specimen: BLOOD LEFT ARM  Result Value Ref Range Status   Specimen Description BLOOD LEFT ARM  Final   Special Requests   Final    BOTTLES DRAWN AEROBIC AND ANAEROBIC Blood Culture adequate volume   Culture   Final    NO GROWTH 4 DAYS Performed at [facility], [place] [address] [place], [place] [address]    Report Status PENDING  Incomplete  Culture, blood (Routine X 2) w Reflex to ID Panel     Status: None (Preliminary result)   Collection Time: 02/22/20 11:25 AM   Specimen: BLOOD RIGHT HAND  Result Value Ref Range Status   Specimen Description BLOOD RIGHT HAND  Final   Special Requests   Final    BOTTLES DRAWN AEROBIC ONLY Blood Culture adequate volume   Culture   Final    NO GROWTH 2 DAYS Performed at [facility], [place] [address] [place], [place] [address]    Report Status PENDING  Incomplete  Culture, blood (Routine X 2) w Reflex to ID Panel     Status: None (Preliminary result)   Collection Time: 02/22/20 11:25 AM   Specimen: BLOOD RIGHT HAND  Result Value Ref Range Status   Specimen Description BLOOD RIGHT HAND  Final   Special Requests   Final    BOTTLES  DRAWN AEROBIC ONLY Blood Culture adequate volume   Culture   Final    NO GROWTH 2 DAYS Performed at [facility], [place] [address] [place], [place] [address]    Report Status PENDING  Incomplete      Radiology Studies: DG Chest  1 View  Result Date: 02/23/2020 CLINICAL DATA:  PICC line EXAM: CHEST  1 VIEW COMPARISON:  02/19/2020, CT chest 12/21/2019 FINDINGS: Tracheostomy tube remains in place. Right-sided central venous catheter with tip projecting over expected location of SVC allowing for mediastinal shift to the left. No pneumothorax. No peripheral catheter is visualized. Persistent consolidation probable pleural effusion on the left. Improved aeration at the right base. Shift of mediastinal contents to the left consistent with volume loss. Aortic atherosclerosis. IMPRESSION: 1. Insertion of right-sided central venous catheter, the tip projects to the left of the spine however the patient is very rotated and there is shift of mediastinal contents to the left and the tip projects over expected location of SVC allowing for rotation and mediastinal shift. There is no pneumothorax. 2. No extremity catheter is visualized 3. Slightly improved aeration at the right base. Left basilar consolidation and probable effusion, in addition to the remaining pulmonary infiltrates are otherwise unchanged. Electronically Signed   By: Donavan Foil M.D.   On: 02/23/2020 19:07   MR PELVIS WO CONTRAST  Result Date: 02/23/2020 CLINICAL DATA:  Sepsis.  Sacral decubitus ulcer. EXAM: MRI PELVIS WITHOUT CONTRAST TECHNIQUE: Multiplanar multisequence MR imaging of the pelvis was performed. No intravenous contrast was administered. COMPARISON:  CT scan of the chest abdomen and pelvis dated 12/21/2019 FINDINGS: Musculoskeletal: There is abnormal increased T2 signal from the fifth sacral segment. There is fragmentation of the proximal segments of the coccyx. There is patchy decreased signal intensity from the marrow of the  bones of the pelvis, probably representing red marrow reactivation. No fractures. There is patchy edema in the muscles around both hips and in both buttocks with subcutaneous edema in the lateral aspects of both hips and in the proximal thighs, left more than right. Urinary Tract: There is a fluid-fluid level in the bladder, possibly representing debris or protein in the bladder. Enlargement of the median lobe of the prostate gland. Bowel: Ostomy in the left mid abdomen. Left inguinal hernia contains a loop of small bowel. Vascular/Lymphatic: No pathologically enlarged lymph nodes. No significant vascular abnormality seen. Reproductive:  Enlarged median lobe of the prostate gland. Other:  None. IMPRESSION: 1. Osteomyelitis of the fifth sacral segment. 2. Edema in the muscles around both hips and in the proximal thighs, left more than right. This could represent myositis. 3. Left inguinal hernia contains a loop of small bowel. 4. Enlarged median lobe of the prostate gland. Electronically Signed   By: Lorriane Shire M.D.   On: 02/23/2020 17:35   MR SACRUM SI JOINTS WO CONTRAST  Result Date: 02/23/2020 CLINICAL DATA:  Sepsis.  Sacral decubitus ulcer. EXAM: MRI SACRUM WITHOUT CONTRAST TECHNIQUE: Multiplanar, multisequence MR imaging of the sacrum was performed. No intravenous contrast was administered. COMPARISON:  CT scan of the abdomen and pelvis dated 12/21/2019 FINDINGS: There is abnormal edema in the fifth sacral segment. The proximal segments of the coccyx are in either destroyed or have been previously resected due to the overlying sacral decubitus ulcer. There is diffuse patchy low signal of the marrow of the pelvic bones and lumbar spine consistent with red marrow reactivation. The sacroiliac joints and hip joints are normal. There is edema in the muscles around the hips and in the posterior paraspinal musculature of the lower lumbar spine and in the buttocks which could represent myositis. Fluid-fluid  level in the bladder may represent protein or debris in the bladder. Does the patient have findings of a urinary tract infection? Enlarged median lobe  of the prostate gland. IMPRESSION: 1. Osteomyelitis of the fifth sacral segment. 2. Edema in the muscles around the hips and in the posterior paraspinal musculature of the lower lumbar spine and in the buttocks which could represent myositis. 3. Fluid-fluid level in the bladder may represent protein or debris in the bladder. The possibility of urinary tract infection should be considered. Electronically Signed   By: Lorriane Shire M.D.   On: 02/23/2020 17:40     Scheduled Meds: . chlorhexidine gluconate (MEDLINE KIT)  15 mL Mouth Rinse BID  . Chlorhexidine Gluconate Cloth  6 each Topical Q0600  . diltiazem  30 mg Per Tube Q8H  . docusate  100 mg Per Tube BID  . feeding supplement (PRO-STAT SUGAR FREE 64)  30 mL Per Tube BID  . insulin aspart  0-9 Units Subcutaneous Q4H  . mouth rinse  15 mL Mouth Rinse 10 times per day  . metroNIDAZOLE  500 mg Per Tube Q8H  . pantoprazole (PROTONIX) IV  40 mg Intravenous Q24H  . polyethylene glycol  17 g Per Tube Daily  . vancomycin variable dose per unstable renal function (pharmacist dosing)   Does not apply See admin instructions   Continuous Infusions: . sodium chloride    . cefTRIAXone (ROCEPHIN)  IV 2 g (02/23/20 1920)  . feeding supplement (NEPRO CARB STEADY) 1,000 mL (02/23/20 1814)     LOS: 10 days   Time Spent in minutes   45 minutes  Chirsty Armistead D.O. on 02/24/2020 at 10:09 AM  Between 7am to 7pm - Please see pager noted on [email or web address]  After 7pm go to [email or web address]  And look for the night coverage person covering for me after hours  [facility] Office  [phone]

## 2020-02-25 ENCOUNTER — Inpatient Hospital Stay (HOSPITAL_COMMUNITY): Payer: Medicare HMO

## 2020-02-25 LAB — COMPREHENSIVE METABOLIC PANEL
ALT: 85 U/L — ABNORMAL HIGH (ref 0–44)
AST: 62 U/L — ABNORMAL HIGH (ref 15–41)
Albumin: 2.5 g/dL — ABNORMAL LOW (ref 3.5–5.0)
Alkaline Phosphatase: 219 U/L — ABNORMAL HIGH (ref 38–126)
Anion gap: 22 — ABNORMAL HIGH (ref 5–15)
BUN: 157 mg/dL — ABNORMAL HIGH (ref 8–23)
CO2: 21 mmol/L — ABNORMAL LOW (ref 22–32)
Calcium: 10.2 mg/dL (ref 8.9–10.3)
Chloride: 98 mmol/L (ref 98–111)
Creatinine, Ser: 4.88 mg/dL — ABNORMAL HIGH (ref 0.61–1.24)
GFR calc Af Amer: 13 mL/min — ABNORMAL LOW (ref >60)
GFR calc non Af Amer: 11 mL/min — ABNORMAL LOW (ref >60)
Glucose, Bld: 154 mg/dL — ABNORMAL HIGH (ref 70–99)
Potassium: 4.5 mmol/L (ref 3.5–5.1)
Sodium: 141 mmol/L (ref 135–145)
Total Bilirubin: 0.4 mg/dL (ref 0.3–1.2)
Total Protein: 8.6 g/dL — ABNORMAL HIGH (ref 6.5–8.1)

## 2020-02-25 LAB — GLUCOSE, CAPILLARY
Glucose-Capillary: 182 mg/dL — ABNORMAL HIGH (ref 70–99)
Glucose-Capillary: 194 mg/dL — ABNORMAL HIGH (ref 70–99)
Glucose-Capillary: 139 mg/dL — ABNORMAL HIGH (ref 70–99)
Glucose-Capillary: 204 mg/dL — ABNORMAL HIGH (ref 70–99)
Glucose-Capillary: 151 mg/dL — ABNORMAL HIGH (ref 70–99)

## 2020-02-25 LAB — MAGNESIUM: Magnesium: 2.5 mg/dL — ABNORMAL HIGH (ref 1.7–2.4)

## 2020-02-25 LAB — CBC
HCT: 30.6 % — ABNORMAL LOW (ref 39.0–52.0)
Hemoglobin: 9.8 g/dL — ABNORMAL LOW (ref 13.0–17.0)
MCH: 26.3 pg (ref 26.0–34.0)
MCHC: 32 g/dL (ref 30.0–36.0)
MCV: 82.3 fL (ref 80.0–100.0)
Platelets: 283 10*3/uL (ref 150–400)
RBC: 3.72 MIL/uL — ABNORMAL LOW (ref 4.22–5.81)
RDW: 18.3 % — ABNORMAL HIGH (ref 11.5–15.5)
WBC: 21 10*3/uL — ABNORMAL HIGH (ref 4.0–10.5)
nRBC: 0 % (ref 0.0–0.2)

## 2020-02-25 LAB — CULTURE, BLOOD (ROUTINE X 2)
Culture: NO GROWTH
Special Requests: ADEQUATE

## 2020-02-25 MED ORDER — APIXABAN 2.5 MG PO TABS
2.5000 mg | ORAL_TABLET | Freq: Two times a day (BID) | ORAL | Status: DC
  Administered 2020-02-25 – 2020-02-26 (×2): 2.5 mg via ORAL
  Filled 2020-02-25 (×3): qty 1

## 2020-02-25 MED ORDER — INSULIN ASPART 100 UNIT/ML ~~LOC~~ SOLN
2.0000 [IU] | SUBCUTANEOUS | Status: DC
  Administered 2020-02-25 – 2020-04-24 (×284): 2 [IU] via SUBCUTANEOUS

## 2020-02-25 MED ORDER — APIXABAN 2.5 MG PO TABS
2.5000 mg | ORAL_TABLET | Freq: Two times a day (BID) | ORAL | Status: DC
  Filled 2020-02-25 (×2): qty 1

## 2020-02-25 MED ORDER — CHLORHEXIDINE GLUCONATE CLOTH 2 % EX PADS
6.0000 | MEDICATED_PAD | Freq: Every day | CUTANEOUS | Status: DC
  Administered 2020-02-26 – 2020-03-01 (×5): 6 via TOPICAL

## 2020-02-25 MED ORDER — INSULIN ASPART 100 UNIT/ML ~~LOC~~ SOLN
0.0000 [IU] | SUBCUTANEOUS | Status: DC
  Administered 2020-02-25: 1 [IU] via SUBCUTANEOUS
  Administered 2020-02-25: 2 [IU] via SUBCUTANEOUS
  Administered 2020-02-26 – 2020-03-08 (×33): 1 [IU] via SUBCUTANEOUS
  Administered 2020-03-08 – 2020-03-09 (×2): 0 [IU] via SUBCUTANEOUS
  Administered 2020-03-09 – 2020-03-15 (×20): 1 [IU] via SUBCUTANEOUS
  Administered 2020-03-16: 3 [IU] via SUBCUTANEOUS
  Administered 2020-03-16 – 2020-04-11 (×29): 1 [IU] via SUBCUTANEOUS
  Administered 2020-04-11: 0 [IU] via SUBCUTANEOUS
  Administered 2020-04-12 – 2020-04-22 (×6): 1 [IU] via SUBCUTANEOUS

## 2020-02-25 NOTE — Progress Notes (Signed)
NAME:  Shane Banks, MRN:  [id number], DOB:  Aug 28, 1946, LOS: 87 ADMISSION DATE:  02/14/2020, CONSULTATION DATE:  5/19 REFERRING MD:  Dr. Alvino Chapel, CHIEF COMPLAINT:  Hemoptysis   Brief History   74 year old male. COVID ARDS in January. Unfortunately required trach and LTACH placement. Had been tolerating trach collar, but on 5/19 he developed hemoptysis and hypoxemic respiratory failure requiring vent.   History of present illness   74 year old male with past medical history as below, which is significant for Atrial fibrillation and stroke. He was admitted to [facility] for [place] 19 pneumonia, which unfortunately progressed to ARDS.  It seems as though his highest vent settings were 60% FiO2 and 8 of PEEP.  He had very little improvement in his ventilatory and oxygenation use over the course of his hospitalization and ultimately underwent tracheostomy and was discharged to [facility] in [place] on February 23. His course at [facility] was complicated by ongoing infections including UTI, MDRO pseudomonas pneumonia, and stenotrophomonas bacteremia. As a result of sepsis and shock he developed renal failure requiring dialysis, which he now receives M/W/F. In the morning hours oh 5/19 he developed hemoptysis. He had previously been taken off anticoagulation (for AF) due to anemia with positive hemoccult. Transferred to [facility] ED for evaluation of hemoptysis. Prior to the transfer he had been tolerating 28% ATC.   Past Medical History   has a past medical history of Acute on chronic respiratory failure with hypoxia ([place]), Atrial fibrillation with RVR ([place]), BPH (benign prostatic hyperplasia), COVID-19 virus infection, Pneumonia due to COVID-19 virus, Severe sepsis ([place]), and Stroke ([place]).   Significant Hospital Events   1/8  Admit to [facility] for COVID PNA 2/23 Transfer to [facility] on vent 5/19 Transfer to [facility] for hemoptysis. #6 cuffed trach placed. Back on  vent. Bronch performed 5/20 Transitioned to trach collar.  5/22 back on vent full time 5/24 back on antibiotics for fevers  Consults:  ENT nephro ID  Procedures:  ENT flex scope 5/19 > no bleeding source identified FOB 5/19 > no pulmonary bleeding source identified.   Significant Diagnostic Tests:  CXR 5/28 >> R CVC placement confirmed Slightly improved aeration at the right base. 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 Edema in the muscles around the hips and in the posterior paraspinal musculature of the lower lumbar spine and in the buttocks which could represent myositis. Fluid-fluid level in the bladder may represent protein or debris in the bladder. The possibility of urinary tract infection should be considered.  Micro Data:  BAL 5/19 > no growth 5/19 blood > NG 5/22 trach aspirate>> candida [place] 5/22 blood>> 1/4 GPC> staph epi 5/25 blood>> GPC (enterococcus on biofire) 2/4 bottles>> E. Faecalis, staph epi. 5/25 trach aspirate>>many PMN, few GPR, rare GPC> diphtheroids 5/27 blood cx>> + for Enterococcus Faecalis/ Staphylococcus Epidermidis  Of note tracheal aspirate 5/7 + for Pseudomonas AERUGINOSA   Antimicrobials:  Levofloxacin 5/24>5/26 vanc 5/25> Ceftriaxone  5/27> Metronidazole 5/27>   Interim history/subjective:  States he is doing ok.  WBC is 21,000  T Max is 99.6 + 3.9 L No weaning 5/30 Receiving HD at present BP soft>> Goal is for 2 L but most likely will be less States he feels tired today  CXR 5/30 Stable blunting of the left greater than right costophrenic  angles, differential includes pleural-parenchymal scarring or small pleural effusions. Stable patchy left lung base consolidation with background of patchy reticular opacities in both lungs, cannot exclude pneumonia  or aspiration superimposed on pulmonary fibrosis  Objective   Blood pressure (!) 84/68, pulse (!) 107, temperature 98.7 F (37.1 C), temperature source Axillary, resp. rate (!) 39, height _0  (1.753 m), weight 59.6 kg, SpO2 100 %.    Vent Mode: PRVC FiO2 (%):  [40 %] 40 % Set Rate:  [16 bmp] 16 bmp Vt Set:  [480 mL] 480 mL PEEP:  [5 cmH20] 5 cmH20 Plateau Pressure:  [30 cmH20-35 cmH20] 35 cmH20   Intake/Output Summary (Last 24 hours) at 02/25/2020 1040 Last data filed at 02/25/2020 1000 Gross per 24 hour  Intake 730.32 ml  Output 300 ml  Net 430.32 ml   Filed Weights   02/22/20 0500 02/25/20 0500 02/25/20 0830  Weight: 57.7 kg 58.7 kg 59.6 kg    Examination:   Gen:      Frail elderly man in NAD, on HD HEENT:  [place]/AT, eyes anicteric. Temporal wasting. Neck:     Trach in place and secure  without bleeding, slight JVD, R IJ HD cath Lungs:    tachypneic on the vent, faint rhonchi bilaterally, + crackles per bases,  diminished per bases, white frothy secretions CV:         S1, S2, Irr, mild tachycardia, a fib per monitor Abd:      Thin, soft, NT, ND. PEG without erythema, ostomy pink with brown liquid stool noted Ext:    Minimal muscle mass, no edema, no obvious deformities Skin:       Warm, dry, intact, no lesions noted Neuro:     Awake, globally weak, deconditioned,  nodding to answer questions   Resolved Hospital Problem list     Assessment & Plan:   Acute on chronic hypoxemic respiratory failure requiring tracheostomy, prolonged MV: he has a trach s/p COVID pneumonia earlier this year. His oxygenation had  improved to tolerate 28% ATC, but has been back on the vent for several days. Course since complicated by multiple pneumonias, which is seems he has complete tx for (MDRO pseudomonas treated with inhaled tobramycin). Now he has had hemoptysis and aspirated blood. Decompensated in ER in this setting. Hemoptysis seems to be originating from stoma. Ulceration?  Has a cuffed  trach placed by ENT.  No active bleeding from the blood on bronchoscopy New LUL pneumonia- diphtheroids on cx.  Plan -Con't full vent support. Restarting PS+ CPAP trials on PS 15- remains  tachypneic , -Con't vanc, flagyl,ceftriaxone -VAP prevention protocol. 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 S/p pRBC transfusion. -con't to monitor -tranfuse for Hb <7 or hemodynamically significant bleeding  ESRD on HD  -dialysis per nephro ( Last HD 5/30) -renally dose meds -strict I/os -line replaced 5/28 -renal TF for hyperkalemia - Trend BMET  - Replete electrolytes as needed  Paroxysmal atrial fibrillation: Tachy. Amiodarone stopped at [facility] due to bradycardia, anticoagulation stopped due to suspected GIB with several transfusions.  -con't tele monitoring -  restart low-dose eliquis after bone biopsy  Cachexia, severe protein energy malnutrition -con't TF via PEG - Prostat as ordered  Sacral decub; concern for sacral OM -WOC consult- appreciate their input -ceftriaxone & flagyl per ID -IR consult for bone biopsy- MRI 5/28 confirms OM  Hyperglycemia- at goal. Minimal insulin needs. -con't SSI PRN -accuchecks Q4h -goal BG 140-180 while admitted to ICU - Will add 2 units of TF coverage to see if we can get within range  Best practice:  Diet: TF Pain/Anxiety/Delirium protocol (if indicated): PRN sedation for vent complaince VAP protocol (if indicated): Per protocol DVT prophylaxis: eliquis low- dose GI prophylaxis: PPI Glucose control: SSI Mobility: BR Code Status: DNR Family  Communication: Sister updated 5/29 Disposition: ICU   This patient is critically ill with multiple organ system failure which requires frequent high complexity decision making, assessment, support, evaluation, and titration of therapies. This was completed through the application of advanced monitoring technologies and extensive interpretation of multiple databases. During this encounter critical care time was devoted to patient care services described in this note for 32 minutes.  Magdalen Spatz, MSN, AGACNP-BC [place] for personal pager PCCM on call pager [phone]  02/25/20 10:40 AM

## 2020-02-25 NOTE — Progress Notes (Signed)
IR received request for bone biopsy of osteomyelitis to aid in antibiotic course. Pt with enterococcal bacteremia on blood culture but also noted to have sacral decubitus ulcer. MRI was recommended and performed. MRI shows osteomyelitis of the S5(fifth sacral segment)  Images reviewed with and by Dr. Kathlene Cote who feels this area is too small and fragile and therefore unsafe for biopsy with high risk of bone fracture/shatter. No adjacent abscess to aspirate. IR will not proceed with biopsy. Attending MD notified.  Ascencion Dike PA-C Interventional Radiology 02/25/2020 10:18 AM

## 2020-02-25 NOTE — Progress Notes (Signed)
PROGRESS NOTE    Shane Banks  HQP:[id number] DOB: Feb 13, 1946 DOA: 02/14/2020 PCP: [name], MD   Brief Narrative:  HPI On 02/14/2020 by [name], NP (PCCM) 74 year old male with past medical history as below, which is significant for Atrial fibrillation and stroke. He was admitted to [facility] for [place] 19 pneumonia, which unfortunately progressed to ARDS.  It seems as though his highest vent settings were 60% FiO2 and 8 of PEEP.  He had very little improvement in his ventilatory and oxygenation use over the course of his hospitalization and ultimately underwent tracheostomy and [facility] [place] on February 23. His course at [facility] was complicated by ongoing infections including UTI, MDRO pseudomonas pneumonia, and stenotrophomonas bacteremia. As a result of sepsis and shock he developed renal failure requiring dialysis, which he now receives M/W/F. In the morning hours oh 5/19 he developed hemoptysis. He had previously been taken off anticoagulation (for AF) due to anemia with positive hemoccult. Transferred to [facility] ED for evaluation of hemoptysis. Prior to the transfer he had been tolerating 28% ATC.   Interim history Admitted with acute on chronic hypoxemic respiratory failure also noted to have bleeding from tracheostomy site.  Patient now with sepsis secondary to bacteremia as well as osteomyelitis of the fifth sacral segment.  ID consulted and following.  Nephrology consulted and following for ESRD.  Pulmonology continues to follow for vent management. Assessment & Plan   Acute on chronic hypoxemic respiratory failure -Trach status post Covid infection earlier this year -Oxygenation had improved and patient was able to tolerate 28% ATC however now back on the vent for several days and has been intolerant of the weaning process -He has had multiple pneumonias of different organisms and has been treated with multiple antibiotics  including Fortaz, Merrem, cefepime, Toprol mycin, vancomycin, azithromycin, ceftriaxone, tigecycline, avycaz, merrem, flagyl.  -Most recently pneumonia has been MDRO Pseudomonas for which she was treated with Avycaz and mycin [facility]bul[facility] -He likely has ongoing aspiration -Now with hemoptysis and aspirated blood.  Per critical care note also seems to be originating from stoma.  Question ulceration.  Patient has cuffed trach placed by ENT.  No active bleeding on bronchoscopy.  Atrial fibrillation, paroxysmal -Wound discontinued due to bradycardia.  Anticoagulation discontinued due to suspected GI bleed with several transfusions.  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 -Chest x-ray shows persistent bilateral lower lobe infiltrates with small bilateral pleural effusions -High suspicion of ongoing aspiration -Blood culture 02/20/2020 showed Enterococcus faecalis, Staphylococcus epidermidis -Patient was started on Levaquin, but this was discontinued -Now on vancomycin with HD -Infectious disease consulted and appreciated -Discussed biopsy with surgery, they recommended IR  -Interventional radiology consulted and appreciated-commended MRI -Echocardiogram EF 59-93%, grade 1 diastolic dysfunction -MRI pelvis/sacrum showed osteomyelitis of the fifth sacral segment.  Edema in the muscle around both hips and proximal thighs, left more than right, could represent myositis.  Left inguinal hernia contains loops of small bowel. -Recommending continuing vancomycin, ceftriaxone and Flagyl -Interventional radiology consulted and feels the area for biopsy is too small and fragile, therefore unsafe for biopsy and high risk for bone fracture  End-stage renal disease -Nephrology consulted and appreciated -Continue hemodialysis -Currently patient has a line holiday, with next HD planned for 4 today 02/25/2020 as well as  02/26/2020  Chronic anemia -Likely secondary to ESRD, critical illness -Status post blood transfusion -Continue to monitor  Sacral pressure ulcer-osteomyelitis -Unstageable -Wound care consulted -Discussion above  Cachexia, severe protein malnutrition -Tube feeds via PEG  Mildly elevated LFTs -Continue to monitor  DVT Prophylaxis  SCDs  Code Status: DNR  Family Communication: None at bedside  Disposition Plan:  Status is: Inpatient  Remains inpatient appropriate because:IV treatments appropriate due to intensity of illness or inability to take PO   [facility]po: [facility]              Anticipated d/c is to: TBD              Anticipated d/c date is: > 3 days              Patient currently is not medically stable to d/c.   Consultants PCCM Nephrology ENT Infectious disease Interventional radiology  Procedures  ENT Flex scope FOB  Antibiotics   Anti-infectives (From admission, onward)   Start     Dose/Rate Route Frequency Ordered Stop   02/24/20 1800  vancomycin (VANCOREADY) IVPB 500 mg/100 mL     500 mg 100 mL/hr over 60 Minutes Intravenous  Once 02/24/20 1308 02/24/20 1905   02/22/20 2200  metroNIDAZOLE (FLAGYL) tablet 500 mg     500 mg Per Tube Every 8 hours 02/22/20 0957     02/22/20 1700  cefTRIAXone (ROCEPHIN) 2 g in sodium chloride 0.9 % 100 mL IVPB     2 g 200 mL/hr over 30 Minutes Intravenous Every 24 hours 02/22/20 0957     02/22/20 1200  vancomycin (VANCOCIN) IVPB 500 mg/100 ml premix  Status:  Discontinued     500 mg 100 mL/hr over 60 Minutes Intravenous Every T-Th-Sa (Hemodialysis) 02/20/20 1253 02/21/20 1040   02/21/20 1431  vancomycin variable dose per unstable renal function (pharmacist dosing)      Does not apply See admin instructions 02/21/20 1431     02/21/20 1200  vancomycin (VANCOREADY) IVPB 500 mg/100 mL     500 mg 100 mL/hr over 60 Minutes Intravenous  Once 02/21/20 0956 02/21/20 1253   02/21/20 0714  vancomycin  (VANCOCIN) 500-5 MG/100ML-% IVPB  Status:  Discontinued    Note to Pharmacy: Ashley Akin   : cabinet override      02/21/20 0714 02/21/20 1346   02/20/20 1400  vancomycin (VANCOREADY) IVPB 1250 mg/250 mL     1,250 mg 166.7 mL/hr over 90 Minutes Intravenous  Once 02/20/20 1253 02/20/20 1538   02/20/20 1200  levofloxacin (LEVAQUIN) IVPB 500 mg  Status:  Discontinued     500 mg 100 mL/hr over 60 Minutes Intravenous Every 48 hours 02/20/20 1020 02/21/20 0943   02/15/20 1545  vancomycin (VANCOCIN) IVPB 1000 mg/200 mL premix  Status:  Discontinued     1,000 mg 200 mL/hr over 60 Minutes Intravenous Every 24 hours 02/14/20 1530 02/14/20 1745   02/15/20 0330  ceFEPIme (MAXIPIME) 2 g in sodium chloride 0.9 % 100 mL IVPB  Status:  Discontinued     2 g 200 mL/hr over 30 Minutes Intravenous Every 12 hours 02/14/20 1531 02/14/20 1531   02/14/20 1531  ceFEPIme (MAXIPIME) 2 g in sodium chloride 0.9 % 100 mL IVPB  Status:  Discontinued     2 g 200 mL/hr over 30 Minutes Intravenous Every 12 hours 02/14/20 1531 02/14/20 1745   02/14/20 1530  ceFEPIme (MAXIPIME) 2 g in sodium chloride 0.9 % 100 mL IVPB  Status:  Discontinued     2 g 200 mL/hr over 30 Minutes Intravenous  Once 02/14/20 1520 02/14/20 1531   02/14/20 1530  vancomycin (VANCOREADY) IVPB 1500 mg/300 mL  Status:  Discontinued     1,500 mg 150 mL/hr over 120 Minutes Intravenous  Once 02/14/20 1530 02/14/20 1745      Subjective:   Ethin Drummond seen and examined today.  Patient has no complaints today.  (Minimally conversant) Objective:   Vitals:   02/25/20 0930 02/25/20 0950 02/25/20 1000 02/25/20 1015  BP: (!) 92/59 (!) 88/60 (!) 87/65 (!) 84/68  Pulse:   (!) 108 (!) 107  Resp:   (!) 39   Temp:      TempSrc:      SpO2:   100%   Weight:      Height:        Intake/Output Summary (Last 24 hours) at 02/25/2020 1034 Last data filed at 02/25/2020 1000 Gross per 24 hour  Intake 730.32 ml  Output 300 ml  Net 430.32 ml   Filed Weights    02/22/20 0500 02/25/20 0500 02/25/20 0830  Weight: 57.7 kg 58.7 kg 59.6 kg   Exam  General: Well developed, chronically ill-appearing, NAD  HEENT: NCAT,mucous membranes moist.   Neck: Trach  Cardiovascular: S1 S2 auscultated, irregular  Respiratory: Diminished, but clear  Abdomen: Soft, nontender, nondistended, + bowel sounds, PEG  Extremities: warm dry without cyanosis clubbing or edema  Neuro: Awake and alert, answers questions and follows commands, very weak  Psych: Flat however appears appropriate   Data Reviewed: I have personally reviewed following labs and imaging studies  CBC: Recent Labs  Lab 02/21/20 0252 02/22/20 0839 02/23/20 0258 02/24/20 0742 02/25/20 0151  WBC 18.9* 19.7* 16.2* 20.9* 21.0*  HGB 9.4* 9.5* 9.3* 9.3* 9.8*  HCT 29.5* 30.2* 29.2* 29.3* 30.6*  MCV 82.9 83.7 83.2 81.6 82.3  PLT 185 202 217 256 161   Basic Metabolic Panel: Recent Labs  Lab 02/20/20 1455 02/20/20 1455 02/21/20 0540 02/22/20 0839 02/23/20 0258 02/24/20 0742 02/25/20 0151  NA 139   < > 141 137 139 139 141  K 6.0*   < > 5.6* 4.2 4.2 4.6 4.5  CL 98   < > 100 95* 99 99 98  CO2 22   < > 23 24 22  19* 21*  GLUCOSE 208*   < > 189* 182* 142* 182* 154*  BUN 76*   < > 96* 62* 91* 128* 157*  CREATININE 3.20*   < > 3.80* 2.61* 3.34* 4.62* 4.88*  CALCIUM 9.8   < > 9.9 10.0 10.1 10.3 10.2  MG  --   --   --   --   --   --  2.5*  PHOS 5.0*  --   --   --   --   --   --    < > = values in this interval not displayed.   GFR: Estimated Creatinine Clearance: 11.5 mL/min (A) (by C-G formula based on SCr of 4.88 mg/dL (H)). Liver Function Tests: Recent Labs  Lab 02/20/20 1455 02/25/20 0151  AST  --  62*  ALT  --  85*  ALKPHOS  --  219*  BILITOT  --  0.4  PROT  --  8.6*  ALBUMIN 2.6* 2.5*   No results for input(s): LIPASE, AMYLASE in the last 168 hours. No results for input(s): AMMONIA in the last 168 hours. Coagulation Profile: No results for input(s): INR, PROTIME in  the last 168 hours. Cardiac Enzymes: No results for input(s): CKTOTAL, CKMB, CKMBINDEX, TROPONINI in the last  168 hours. BNP (last 3 results) No results for input(s): PROBNP in the last 8760 hours. HbA1C: No results for input(s): HGBA1C in the last 72 hours. CBG: Recent Labs  Lab 02/24/20 1510 02/24/20 1948 02/24/20 2355 02/25/20 0354 02/25/20 0742  GLUCAP 167* 183* 153* 182* 194*   Lipid Profile: No results for input(s): CHOL, HDL, LDLCALC, TRIG, CHOLHDL, LDLDIRECT in the last 72 hours. Thyroid Function Tests: No results for input(s): TSH, T4TOTAL, FREET4, T3FREE, THYROIDAB in the last 72 hours. Anemia Panel: No results for input(s): VITAMINB12, FOLATE, FERRITIN, TIBC, IRON, RETICCTPCT in the last 72 hours. Urine analysis:    Component Value Date/Time   COLORURINE YELLOW 12/11/2019 1230   APPEARANCEUR HAZY (A) 12/11/2019 1230   LABSPEC 1.018 12/11/2019 1230   PHURINE 6.0 12/11/2019 1230   GLUCOSEU NEGATIVE 12/11/2019 1230   HGBUR NEGATIVE 12/11/2019 1230   BILIRUBINUR NEGATIVE 12/11/2019 [place] 12/11/2019 1230   PROTEINUR 30 (A) 12/11/2019 1230   NITRITE NEGATIVE 12/11/2019 1230   LEUKOCYTESUR LARGE (A) 12/11/2019 1230   Sepsis Labs: @LABRCNTIP (procalcitonin:4,lacticidven:4)  ) Recent Results (from the past 240 hour(s))  MRSA PCR Screening     Status: None   Collection Time: 02/16/20  7:30 PM   Specimen: Nasal Mucosa; Nasopharyngeal  Result Value Ref Range Status   MRSA by PCR NEGATIVE NEGATIVE Final    Comment:        The GeneXpert MRSA Assay (FDA approved for NASAL specimens only), is one component of a comprehensive MRSA colonization surveillance program. It is not intended to diagnose MRSA infection nor to guide or monitor treatment for MRSA infections. Performed at [facility], [place] [address] [place], [place] [address]   Culture, blood (Routine X 2) w Reflex to ID Panel     Status: Abnormal   Collection Time: 02/17/20  6:26  AM   Specimen: BLOOD RIGHT WRIST  Result Value Ref Range Status   Specimen Description BLOOD RIGHT WRIST  Final   Special Requests AEROBIC BOTTLE ONLY Blood Culture adequate volume  Final   Culture  Setup Time   Final    GRAM POSITIVE COCCI IN CLUSTERS AEROBIC BOTTLE ONLY CRITICAL RESULT CALLED TO, READ BACK BY AND VERIFIED WITH: K. HURTH,PHARMD [id number] 02/19/2020 T. TYSOR    Culture (A)  Final    STAPHYLOCOCCUS EPIDERMIDIS SUSCEPTIBILITIES PERFORMED ON PREVIOUS CULTURE WITHIN THE LAST 5 DAYS. Performed at [facility], [place] [address], [place], [place] [address]    Report Status 02/24/2020 FINAL  Final  Culture, blood (Routine X 2) w Reflex to ID Panel     Status: None   Collection Time: 02/17/20  6:26 AM   Specimen: BLOOD RIGHT HAND  Result Value Ref Range Status   Specimen Description BLOOD RIGHT HAND  Final   Special Requests AEROBIC BOTTLE ONLY Blood Culture adequate volume  Final   Culture   Final    NO GROWTH 5 DAYS Performed at [facility], [place] [address] [place], [place] [address]    Report Status 02/22/2020 FINAL  Final  Blood Culture ID Panel (Reflexed)     Status: Abnormal   Collection Time: 02/17/20  6:26 AM  Result Value Ref Range Status   Enterococcus species NOT DETECTED NOT DETECTED Final   Listeria monocytogenes NOT DETECTED NOT DETECTED Final   Staphylococcus species DETECTED (A) NOT DETECTED Final    Comment: Methicillin (oxacillin) resistant coagulase negative staphylococcus. Possible blood culture contaminant (unless isolated from more than one  blood culture draw or clinical case suggests pathogenicity). No antibiotic treatment is indicated for blood  culture contaminants. CRITICAL RESULT CALLED TO, READ BACK BY AND VERIFIED WITH: K. HURTH,PHARMD [id number] 02/19/2020 T. TYSOR    Staphylococcus aureus (BCID) NOT DETECTED NOT DETECTED Final   Methicillin resistance DETECTED (A) NOT DETECTED Final    Comment: CRITICAL RESULT CALLED TO, READ BACK BY AND  VERIFIED WITH: K. HURTH,PHARMD [id number] 02/19/2020 T. TYSOR    Streptococcus species NOT DETECTED NOT DETECTED Final   Streptococcus agalactiae NOT DETECTED NOT DETECTED Final   Streptococcus pneumoniae NOT DETECTED NOT DETECTED Final   Streptococcus pyogenes NOT DETECTED NOT DETECTED Final   Acinetobacter baumannii NOT DETECTED NOT DETECTED Final   Enterobacteriaceae species NOT DETECTED NOT DETECTED Final   Enterobacter cloacae complex NOT DETECTED NOT DETECTED Final   Escherichia coli NOT DETECTED NOT DETECTED Final   Klebsiella oxytoca NOT DETECTED NOT DETECTED Final   Klebsiella pneumoniae NOT DETECTED NOT DETECTED Final   Proteus species NOT DETECTED NOT DETECTED Final   Serratia marcescens NOT DETECTED NOT DETECTED Final   Haemophilus influenzae NOT DETECTED NOT DETECTED Final   Neisseria meningitidis NOT DETECTED NOT DETECTED Final   Pseudomonas aeruginosa NOT DETECTED NOT DETECTED Final   Candida albicans NOT DETECTED NOT DETECTED Final   Candida glabrata NOT DETECTED NOT DETECTED Final   Candida krusei NOT DETECTED NOT DETECTED Final   Candida parapsilosis NOT DETECTED NOT DETECTED Final   Candida tropicalis NOT DETECTED NOT DETECTED Final    Comment: Performed at [facility], [place]. [address] [place], [place] [address]  Culture, respiratory (non-expectorated)     Status: None   Collection Time: 02/17/20  7:30 AM   Specimen: Tracheal Aspirate; Respiratory  Result Value Ref Range Status   Specimen Description TRACHEAL ASPIRATE  Final   Special Requests Normal  Final   Gram Stain   Final    RARE WBC PRESENT, PREDOMINANTLY PMN NO ORGANISMS SEEN Performed at [facility], [place] [address], [place], [place] [address]    Culture RARE CANDIDA PARAPSILOSIS  Final   Report Status 02/20/2020 FINAL  Final  Culture, respiratory (non-expectorated)     Status: None   Collection Time: 02/20/20 12:38 PM   Specimen: Tracheal Aspirate; Respiratory  Result Value Ref Range Status    Specimen Description TRACHEAL ASPIRATE  Final   Special Requests NONE  Final   Gram Stain   Final    RARE WBC PRESENT, PREDOMINANTLY PMN RARE SQUAMOUS EPITHELIAL CELLS PRESENT FEW GRAM POSITIVE RODS RARE GRAM POSITIVE COCCI Performed at [facility], [place] [address], [place], [place] [address]    Culture   Final    ABUNDANT CORYNEBACTERIUM STRIATUM Standardized susceptibility testing for this organism is not available. FEW CANDIDA PARAPSILOSIS    Report Status 02/24/2020 FINAL  Final  Culture, blood (routine x 2)     Status: Abnormal   Collection Time: 02/20/20 12:50 PM   Specimen: BLOOD LEFT HAND  Result Value Ref Range Status   Specimen Description BLOOD LEFT HAND  Final   Special Requests   Final    BOTTLES DRAWN AEROBIC AND ANAEROBIC Blood Culture adequate volume   Culture  Setup Time   Final    GRAM POSITIVE COCCI IN CHAINS IN BOTH AEROBIC AND ANAEROBIC BOTTLES CRITICAL RESULT CALLED TO, READ BACK BY AND VERIFIED WITH: Jens Som AMEND [id number] FCP Performed at [facility], [place] [address] [place], [place] [address]  Culture (A)  Final    ENTEROCOCCUS FAECALIS STAPHYLOCOCCUS EPIDERMIDIS    Report Status 02/24/2020 FINAL  Final   Organism ID, Bacteria ENTEROCOCCUS FAECALIS  Final   Organism ID, Bacteria STAPHYLOCOCCUS EPIDERMIDIS  Final      Susceptibility   Enterococcus faecalis - MIC*    AMPICILLIN <=2 SENSITIVE Sensitive     VANCOMYCIN 1 SENSITIVE Sensitive     GENTAMICIN SYNERGY RESISTANT Resistant     * ENTEROCOCCUS FAECALIS   Staphylococcus epidermidis - MIC*    CIPROFLOXACIN >=8 RESISTANT Resistant     ERYTHROMYCIN >=8 RESISTANT Resistant     GENTAMICIN >=16 RESISTANT Resistant     OXACILLIN >=4 RESISTANT Resistant     TETRACYCLINE 2 SENSITIVE Sensitive     VANCOMYCIN 2 SENSITIVE Sensitive     TRIMETH/SULFA 160 RESISTANT Resistant     CLINDAMYCIN >=8 RESISTANT Resistant     RIFAMPIN <=0.5 SENSITIVE Sensitive     Inducible Clindamycin  NEGATIVE Sensitive     * STAPHYLOCOCCUS EPIDERMIDIS  Blood Culture ID Panel (Reflexed)     Status: Abnormal   Collection Time: 02/20/20 12:50 PM  Result Value Ref Range Status   Enterococcus species DETECTED (A) NOT DETECTED Final    Comment: CRITICAL RESULT CALLED TO, READ BACK BY AND VERIFIED WITH: PHARMD KAREN AMEND [id number] FCP    Vancomycin resistance NOT DETECTED NOT DETECTED Final   Listeria monocytogenes NOT DETECTED NOT DETECTED Final   Staphylococcus species DETECTED (A) NOT DETECTED Final    Comment: Methicillin (oxacillin) resistant coagulase negative staphylococcus. Possible blood culture contaminant (unless isolated from more than one blood culture draw or clinical case suggests pathogenicity). No antibiotic treatment is indicated for blood  culture contaminants. CRITICAL RESULT CALLED TO, READ BACK BY AND VERIFIED WITH: PHARMD KAREN AMEND [id number] FCP    Staphylococcus aureus (BCID) NOT DETECTED NOT DETECTED Final   Methicillin resistance DETECTED (A) NOT DETECTED Final    Comment: CRITICAL RESULT CALLED TO, READ BACK BY AND VERIFIED WITH: PHARMD KAREN AMEND [id number] FCP    Streptococcus species NOT DETECTED NOT DETECTED Final   Streptococcus agalactiae NOT DETECTED NOT DETECTED Final   Streptococcus pneumoniae NOT DETECTED NOT DETECTED Final   Streptococcus pyogenes NOT DETECTED NOT DETECTED Final   Acinetobacter baumannii NOT DETECTED NOT DETECTED Final   Enterobacteriaceae species NOT DETECTED NOT DETECTED Final   Enterobacter cloacae complex NOT DETECTED NOT DETECTED Final   Escherichia coli NOT DETECTED NOT DETECTED Final   Klebsiella oxytoca NOT DETECTED NOT DETECTED Final   Klebsiella pneumoniae NOT DETECTED NOT DETECTED Final   Proteus species NOT DETECTED NOT DETECTED Final   Serratia marcescens NOT DETECTED NOT DETECTED Final   Haemophilus influenzae NOT DETECTED NOT DETECTED Final   Neisseria meningitidis NOT DETECTED NOT DETECTED Final    Pseudomonas aeruginosa NOT DETECTED NOT DETECTED Final   Candida albicans NOT DETECTED NOT DETECTED Final   Candida glabrata NOT DETECTED NOT DETECTED Final   Candida krusei NOT DETECTED NOT DETECTED Final   Candida parapsilosis NOT DETECTED NOT DETECTED Final   Candida tropicalis NOT DETECTED NOT DETECTED Final    Comment: Performed at [facility], [place]. [address], [place], [place] [address]  Culture, blood (routine x 2)     Status: None   Collection Time: 02/20/20 12:58 PM   Specimen: BLOOD LEFT ARM  Result Value Ref Range Status   Specimen Description BLOOD LEFT ARM  Final   Special Requests   Final    BOTTLES  DRAWN AEROBIC AND ANAEROBIC Blood Culture adequate volume   Culture   Final    NO GROWTH 5 DAYS Performed at [facility], [place] [address], [place], [place] [address]    Report Status 02/25/2020 FINAL  Final  Culture, blood (Routine X 2) w Reflex to ID Panel     Status: None (Preliminary result)   Collection Time: 02/22/20 11:25 AM   Specimen: BLOOD RIGHT HAND  Result Value Ref Range Status   Specimen Description BLOOD RIGHT HAND  Final   Special Requests   Final    BOTTLES DRAWN AEROBIC ONLY Blood Culture adequate volume   Culture   Final    NO GROWTH 3 DAYS Performed at [facility], [place] [address], [place], [place] [address]    Report Status PENDING  Incomplete  Culture, blood (Routine X 2) w Reflex to ID Panel     Status: None (Preliminary result)   Collection Time: 02/22/20 11:25 AM   Specimen: BLOOD RIGHT HAND  Result Value Ref Range Status   Specimen Description BLOOD RIGHT HAND  Final   Special Requests   Final    BOTTLES DRAWN AEROBIC ONLY Blood Culture adequate volume   Culture   Final    NO GROWTH 3 DAYS Performed at [facility], [place] [address], [place], [place] [address]    Report Status PENDING  Incomplete      Radiology Studies: DG Chest 1 View  Result Date: 02/23/2020 CLINICAL DATA:  PICC line EXAM: CHEST  1 VIEW  COMPARISON:  02/19/2020, CT chest 12/21/2019 FINDINGS: Tracheostomy tube remains in place. Right-sided central venous catheter with tip projecting over expected location of SVC allowing for mediastinal shift to the left. No pneumothorax. No peripheral catheter is visualized. Persistent consolidation probable pleural effusion on the left. Improved aeration at the right base. Shift of mediastinal contents to the left consistent with volume loss. Aortic atherosclerosis. IMPRESSION: 1. Insertion of right-sided central venous catheter, the tip projects to the left of the spine however the patient is very rotated and there is shift of mediastinal contents to the left and the tip projects over expected location of SVC allowing for rotation and mediastinal shift. There is no pneumothorax. 2. No extremity catheter is visualized 3. Slightly improved aeration at the right base. Left basilar consolidation and probable effusion, in addition to the remaining pulmonary infiltrates are otherwise unchanged. Electronically Signed   By: Donavan Foil M.D.   On: 02/23/2020 19:07   MR PELVIS WO CONTRAST  Result Date: 02/23/2020 CLINICAL DATA:  Sepsis.  Sacral decubitus ulcer. EXAM: MRI PELVIS WITHOUT CONTRAST TECHNIQUE: Multiplanar multisequence MR imaging of the pelvis was performed. No intravenous contrast was administered. COMPARISON:  CT scan of the chest abdomen and pelvis dated 12/21/2019 FINDINGS: Musculoskeletal: There is abnormal increased T2 signal from the fifth sacral segment. There is fragmentation of the proximal segments of the coccyx. There is patchy decreased signal intensity from the marrow of the bones of the pelvis, probably representing red marrow reactivation. No fractures. There is patchy edema in the muscles around both hips and in both buttocks with subcutaneous edema in the lateral aspects of both hips and in the proximal thighs, left more than right. Urinary Tract: There is a fluid-fluid level in the  bladder, possibly representing debris or protein in the bladder. Enlargement of the median lobe of the prostate gland. Bowel: Ostomy in the left mid abdomen. Left inguinal hernia contains a loop of small bowel.  Vascular/Lymphatic: No pathologically enlarged lymph nodes. No significant vascular abnormality seen. Reproductive:  Enlarged median lobe of the prostate gland. Other:  None. IMPRESSION: 1. Osteomyelitis of the fifth sacral segment. 2. Edema in the muscles around both hips and in the proximal thighs, left more than right. This could represent myositis. 3. Left inguinal hernia contains a loop of small bowel. 4. Enlarged median lobe of the prostate gland. Electronically Signed   By: Lorriane Shire M.D.   On: 02/23/2020 17:35   MR SACRUM SI JOINTS WO CONTRAST  Result Date: 02/23/2020 CLINICAL DATA:  Sepsis.  Sacral decubitus ulcer. EXAM: MRI SACRUM WITHOUT CONTRAST TECHNIQUE: Multiplanar, multisequence MR imaging of the sacrum was performed. No intravenous contrast was administered. COMPARISON:  CT scan of the abdomen and pelvis dated 12/21/2019 FINDINGS: There is abnormal edema in the fifth sacral segment. The proximal segments of the coccyx are in either destroyed or have been previously resected due to the overlying sacral decubitus ulcer. There is diffuse patchy low signal of the marrow of the pelvic bones and lumbar spine consistent with red marrow reactivation. The sacroiliac joints and hip joints are normal. There is edema in the muscles around the hips and in the posterior paraspinal musculature of the lower lumbar spine and in the buttocks which could represent myositis. Fluid-fluid level in the bladder may represent protein or debris in the bladder. Does the patient have findings of a urinary tract infection? Enlarged median lobe of the prostate gland. IMPRESSION: 1. Osteomyelitis of the fifth sacral segment. 2. Edema in the muscles around the hips and in the posterior paraspinal musculature of the  lower lumbar spine and in the buttocks which could represent myositis. 3. Fluid-fluid level in the bladder may represent protein or debris in the bladder. The possibility of urinary tract infection should be considered. Electronically Signed   By: Lorriane Shire M.D.   On: 02/23/2020 17:40   DG CHEST PORT 1 VIEW  Result Date: 02/25/2020 CLINICAL DATA:  Respiratory failure EXAM: PORTABLE CHEST 1 VIEW COMPARISON:  02/23/2020 chest radiograph. FINDINGS: Tracheostomy tube tip overlies the tracheal air column at the level of the thoracic inlet. Right internal jugular central venous catheter terminates in the middle third of the SVC. Stable cardiomediastinal silhouette with top-normal heart size. No pneumothorax. Blunting of the left greater than right costophrenic angles, similar. Patchy reticular opacities at the right lung base, stable. Patchy consolidation and reticular opacities throughout the left lung, most prominent at the left lung base, not substantially changed. IMPRESSION: 1. Well-positioned support structures. No pneumothorax. 2. Stable blunting of the left greater than right costophrenic angles, differential includes pleural-parenchymal scarring or small pleural effusions. 3. Stable patchy left lung base consolidation with background of patchy reticular opacities in both lungs, cannot exclude pneumonia or aspiration superimposed on pulmonary fibrosis. Electronically Signed   By: Ilona Sorrel M.D.   On: 02/25/2020 05:07     Scheduled Meds: . chlorhexidine gluconate (MEDLINE KIT)  15 mL Mouth Rinse BID  . Chlorhexidine Gluconate Cloth  6 each Topical Q0600  . Chlorhexidine Gluconate Cloth  6 each Topical Q0600  . diltiazem  30 mg Per Tube Q8H  . docusate  100 mg Per Tube BID  . feeding supplement (PRO-STAT SUGAR FREE 64)  30 mL Per Tube BID  . insulin aspart  0-9 Units Subcutaneous Q4H  . mouth rinse  15 mL Mouth Rinse 10 times per day  . metroNIDAZOLE  500 mg Per Tube Q8H  . pantoprazole  (PROTONIX)  IV  40 mg Intravenous Q24H  . polyethylene glycol  17 g Per Tube Daily  . vancomycin variable dose per unstable renal function (pharmacist dosing)   Does not apply See admin instructions   Continuous Infusions: . sodium chloride    . cefTRIAXone (ROCEPHIN)  IV Stopped (02/24/20 1554)  . feeding supplement (NEPRO CARB STEADY) 45 mL/hr at 02/25/20 0600     LOS: 11 days   Time Spent in minutes   45 minutes  Caryle Helgeson D.O. on 02/25/2020 at 10:34 AM  Between 7am to 7pm - Please see pager noted on [email or web address]  After 7pm go to [email or web address]  And look for the night coverage person covering for me after hours  [facility] Office  [phone]

## 2020-02-25 NOTE — Progress Notes (Signed)
Shane Banks ROUNDING NOTE   Subjective:   This is a 74 year old gentleman with a history of CVA benign plastic hypertrophy atrial fibrillation developed Covid pneumonia admitted to [facility].  Required ventilator support and eventually tracheostomy.  [facility]/21/2019.  Developed complications from UTI Pseudomonas pneumonia stenotrophomonas bacteremia.  Received IV vancomycin developed acute kidney injury started on dialysis 12/16/2019.  Has been dialysis dependent since.  Was admitted to [facility] with hemoptysis 02/14/2020.  Receives dialysis Monday Wednesday Friday.Enterococcus/MRSA positive for blood cultures.  Dialysis catheter removed 02/21/2020, replace catheter 02/23/2020.  MRI confirms osteomyelitis sacrum 02/23/2020  Removal 1 L 02/21/2020 patient scheduled for dialysis 02/25/2020.  High dialysis volumes and patient to be done off schedule.  We will also order dialysis for 02/26/2020.  Status post insertion of right IJ catheter 02/23/2020  Blood pressure 102/61 pulse 103 temperature 100.4 O2 sats 100% FiO2 40%  Sodium 141 potassium 4.5 chloride 98 CO2 21 BUN 157 creatinine 4.88 glucose 154 magnesium 2.5 calcium 10.2 albumin 2.5 hemoglobin 9.8 WBC 21,000  Insulin sliding scale, Protonix 40 mg every 24 hours, Eliquis 2.5 mg twice daily, Cardizem 30 mg every 8 hours  IV vancomycin IV Rocephin 2 g every 24 hours Flagyl 500 mg every 8 hours   Objective:  Vital signs in last 24 hours:  Temp:  [99.3 F (37.4 C)-100.4 F (38 C)] 99.3 F (37.4 C) (05/30 0358) Pulse Rate:  [78-107] 106 (05/30 0600) Resp:  [21-41] 21 (05/30 0600) BP: (97-122)/(59-88) 104/63 (05/30 0600) SpO2:  [97 %-100 %] 100 % (05/30 0600) FiO2 (%):  [40 %] 40 % (05/30 0422) Weight:  [58.7 kg] 58.7 kg (05/30 0500)  Weight change:  Filed Weights   02/21/20 0900 02/22/20 0500 02/25/20 0500  Weight: 59.3 kg 57.7 kg 58.7 kg     Intake/Output: I/O last 3 completed shifts: In: 840.3 [NG/GT:750; IV Piggyback:90.3] Out: 300 [Stool:300]   Intake/Output this shift:  No intake/output data recorded.  Genelderly AAM, awake , responsive No jvd or bruits Chestclear to A/P ant and lat RRRbounding precordium, no sig MRG Abd soft ntnd no mass or ascites +bs, +LLQ ostomy, +PEG tube GU normal male MS no joint effusions or deformity Ext1+ dependent edema Feet in boots Neuro more responsive today , more alert    Basic Metabolic Panel: Recent Labs  Lab 02/20/20 1455 02/20/20 1455 02/21/20 0540 02/21/20 0540 02/22/20 0839 02/22/20 0839 02/23/20 0258 02/24/20 0742 02/25/20 0151  NA 139   < > 141  --  137  --  139 139 141  K 6.0*   < > 5.6*  --  4.2  --  4.2 4.6 4.5  CL 98   < > 100  --  95*  --  99 99 98  CO2 22   < > 23  --  24  --  22 19* 21*  GLUCOSE 208*   < > 189*  --  182*  --  142* 182* 154*  BUN 76*   < > 96*  --  62*  --  91* 128* 157*  CREATININE 3.20*   < > 3.80*  --  2.61*  --  3.34* 4.62* 4.88*  CALCIUM 9.8   < > 9.9   < > 10.0   < > 10.1 10.3 10.2  MG  --   --   --   --   --   --   --   --  2.5*  PHOS 5.0*  --   --   --   --   --   --   --   --    < > = values in this interval not displayed.    Liver Function Tests: Recent Labs  Lab 02/20/20 1455 02/25/20 0151  AST  --  62*  ALT  --  85*  ALKPHOS  --  219*  BILITOT  --  0.4  PROT  --  8.6*  ALBUMIN 2.6* 2.5*   No results for input(s): LIPASE, AMYLASE in the last 168 hours. No results for input(s): AMMONIA in the last 168 hours.  CBC: Recent Labs  Lab 02/21/20 0252 02/22/20 0839 02/23/20 0258 02/24/20 0742 02/25/20 0151  WBC 18.9* 19.7* 16.2* 20.9* 21.0*  HGB 9.4* 9.5* 9.3* 9.3* 9.8*  HCT 29.5* 30.2* 29.2* 29.3* 30.6*  MCV 82.9 83.7 83.2 81.6 82.3  PLT 185 202 217 256 283    Cardiac Enzymes: No results for input(s): CKTOTAL, CKMB, CKMBINDEX, TROPONINI in the last 168 hours.  BNP: Invalid input(s):  POCBNP  CBG: Recent Labs  Lab 02/24/20 1204 02/24/20 1510 02/24/20 1948 02/24/20 2355 02/25/20 0354  GLUCAP 158* 167* 183* 153* 182*    Microbiology: Results for orders placed or performed during the hospital encounter of 02/14/20  Culture, blood (routine x 2)     Status: None   Collection Time: 02/14/20  2:55 PM   Specimen: BLOOD RIGHT WRIST  Result Value Ref Range Status   Specimen Description BLOOD RIGHT WRIST  Final   Special Requests   Final    BOTTLES DRAWN AEROBIC AND ANAEROBIC Blood Culture results may not be optimal due to an excessive volume of blood received in culture bottles   Culture   Final    NO GROWTH 5 DAYS Performed at [facility], [place] [address], [place], [place] [address]    Report Status 02/19/2020 FINAL  Final  SARS Coronavirus 2 by RT PCR (hospital order, performed in [facility] hospital lab) Nasopharyngeal Nasopharyngeal Swab     Status: None   Collection Time: 02/14/20  3:00 PM   Specimen: Nasopharyngeal Swab  Result Value Ref Range Status   SARS Coronavirus 2 NEGATIVE NEGATIVE Final    Comment: (NOTE) SARS-CoV-2 target nucleic acids are NOT DETECTED. The SARS-CoV-2 RNA is generally detectable in upper and lower respiratory specimens during the acute phase of infection. The lowest concentration of SARS-CoV-2 viral copies this assay can detect is 250 copies / mL. A negative result does not preclude SARS-CoV-2 infection and should not be used as the sole basis for treatment or other patient management decisions.  A negative result may occur with improper specimen collection / handling, submission of specimen other than nasopharyngeal swab, presence of viral mutation(s) within the areas targeted by this assay, and inadequate number of viral copies (<250 copies / mL). A negative result must be combined with clinical observations, patient history, and epidemiological information. Fact Sheet for Patients:    StrictlyIdeas.no Fact Sheet for Healthcare Providers: BankingDealers.co.za This test is not yet approved or cleared  by the [place] FDA and has been authorized for detection and/or diagnosis of SARS-CoV-2 by FDA under an Emergency Use Authorization (EUA).  This EUA will remain in effect (meaning this test can be used) for the duration of the COVID-19 declaration under Section 564(b)(1) of the Act, 21 U.S.C. section 360bbb-3(b)(1), unless the authorization is terminated or revoked sooner. Performed at [facility], [place] [address]  [place], [place] [address]   Culture, bal-quantitative     Status: None   Collection Time: 02/14/20  5:17 PM   Specimen: Bronchoalveolar Lavage; Respiratory  Result Value Ref Range Status   Specimen Description BRONCHIAL ALVEOLAR LAVAGE  Final   Special Requests Normal  Final   Gram Stain   Final    RARE WBC PRESENT,BOTH PMN AND MONONUCLEAR NO ORGANISMS SEEN    Culture   Final    NO GROWTH 2 DAYS Performed at [facility], [address], [place], [place] [address]    Report Status 02/17/2020 FINAL  Final  Culture, blood (routine x 2)     Status: None   Collection Time: 02/14/20  7:20 PM   Specimen: BLOOD  Result Value Ref Range Status   Specimen Description BLOOD RIGHT ANTECUBITAL  Final   Special Requests   Final    BOTTLES DRAWN AEROBIC AND ANAEROBIC Blood Culture adequate volume   Culture   Final    NO GROWTH 5 DAYS Performed at [facility], [place] [address], [place], [place] [address]    Report Status 02/19/2020 FINAL  Final  MRSA PCR Screening     Status: None   Collection Time: 02/16/20  7:30 PM   Specimen: Nasal Mucosa; Nasopharyngeal  Result Value Ref Range Status   MRSA by PCR NEGATIVE NEGATIVE Final    Comment:        The GeneXpert MRSA Assay (FDA approved for NASAL specimens only), is one component of a comprehensive MRSA colonization surveillance program. It  is not intended to diagnose MRSA infection nor to guide or monitor treatment for MRSA infections. Performed at [facility], [place] [address] [place], [place] [address]   Culture, blood (Routine X 2) w Reflex to ID Panel     Status: Abnormal   Collection Time: 02/17/20  6:26 AM   Specimen: BLOOD RIGHT WRIST  Result Value Ref Range Status   Specimen Description BLOOD RIGHT WRIST  Final   Special Requests AEROBIC BOTTLE ONLY Blood Culture adequate volume  Final   Culture  Setup Time   Final    GRAM POSITIVE COCCI IN CLUSTERS AEROBIC BOTTLE ONLY CRITICAL RESULT CALLED TO, READ BACK BY AND VERIFIED WITH: K. HURTH,PHARMD [id number] 02/19/2020 T. TYSOR    Culture (A)  Final    STAPHYLOCOCCUS EPIDERMIDIS SUSCEPTIBILITIES PERFORMED ON PREVIOUS CULTURE WITHIN THE LAST 5 DAYS. Performed at [facility], [place] [address], [place], [place] [address]    Report Status 02/24/2020 FINAL  Final  Culture, blood (Routine X 2) w Reflex to ID Panel     Status: None   Collection Time: 02/17/20  6:26 AM   Specimen: BLOOD RIGHT HAND  Result Value Ref Range Status   Specimen Description BLOOD RIGHT HAND  Final   Special Requests AEROBIC BOTTLE ONLY Blood Culture adequate volume  Final   Culture   Final    NO GROWTH 5 DAYS Performed at [facility], [place] [address], [place],  [address]    Report Status 02/22/2020 FINAL  Final  Blood Culture ID Panel (Reflexed)     Status: Abnormal   Collection Time: 02/17/20  6:26 AM  Result Value Ref Range Status   Enterococcus species NOT DETECTED NOT DETECTED Final   Listeria monocytogenes NOT DETECTED NOT DETECTED Final   Staphylococcus species DETECTED (A) NOT DETECTED Final    Comment: Methicillin (oxacillin) resistant coagulase negative staphylococcus. Possible blood culture contaminant (unless isolated  from more than one blood culture draw or clinical case suggests pathogenicity). No antibiotic treatment is indicated for blood  culture  contaminants. CRITICAL RESULT CALLED TO, READ BACK BY AND VERIFIED WITH: K. HURTH,PHARMD [id number] 02/19/2020 T. TYSOR    Staphylococcus aureus (BCID) NOT DETECTED NOT DETECTED Final   Methicillin resistance DETECTED (A) NOT DETECTED Final    Comment: CRITICAL RESULT CALLED TO, READ BACK BY AND VERIFIED WITH: K. HURTH,PHARMD [id number] 02/19/2020 T. TYSOR    Streptococcus species NOT DETECTED NOT DETECTED Final   Streptococcus agalactiae NOT DETECTED NOT DETECTED Final   Streptococcus pneumoniae NOT DETECTED NOT DETECTED Final   Streptococcus pyogenes NOT DETECTED NOT DETECTED Final   Acinetobacter baumannii NOT DETECTED NOT DETECTED Final   Enterobacteriaceae species NOT DETECTED NOT DETECTED Final   Enterobacter cloacae complex NOT DETECTED NOT DETECTED Final   Escherichia coli NOT DETECTED NOT DETECTED Final   Klebsiella oxytoca NOT DETECTED NOT DETECTED Final   Klebsiella pneumoniae NOT DETECTED NOT DETECTED Final   Proteus species NOT DETECTED NOT DETECTED Final   Serratia marcescens NOT DETECTED NOT DETECTED Final   Haemophilus influenzae NOT DETECTED NOT DETECTED Final   Neisseria meningitidis NOT DETECTED NOT DETECTED Final   Pseudomonas aeruginosa NOT DETECTED NOT DETECTED Final   Candida albicans NOT DETECTED NOT DETECTED Final   Candida glabrata NOT DETECTED NOT DETECTED Final   Candida krusei NOT DETECTED NOT DETECTED Final   Candida parapsilosis NOT DETECTED NOT DETECTED Final   Candida tropicalis NOT DETECTED NOT DETECTED Final    Comment: Performed at [facility], [place]. [address] [place], [place] [address]  Culture, respiratory (non-expectorated)     Status: None   Collection Time: 02/17/20  7:30 AM   Specimen: Tracheal Aspirate; Respiratory  Result Value Ref Range Status   Specimen Description TRACHEAL ASPIRATE  Final   Special Requests Normal  Final   Gram Stain   Final    RARE WBC PRESENT, PREDOMINANTLY PMN NO ORGANISMS SEEN Performed at [facility],  [place] [address] [place], [place] [address]    Culture RARE CANDIDA PARAPSILOSIS  Final   Report Status 02/20/2020 FINAL  Final  Culture, respiratory (non-expectorated)     Status: None   Collection Time: 02/20/20 12:38 PM   Specimen: Tracheal Aspirate; Respiratory  Result Value Ref Range Status   Specimen Description TRACHEAL ASPIRATE  Final   Special Requests NONE  Final   Gram Stain   Final    RARE WBC PRESENT, PREDOMINANTLY PMN RARE SQUAMOUS EPITHELIAL CELLS PRESENT FEW GRAM POSITIVE RODS RARE GRAM POSITIVE COCCI Performed at [facility], [place] [address] [place], [place] [address]    Culture   Final    ABUNDANT CORYNEBACTERIUM STRIATUM Standardized susceptibility testing for this organism is not available. FEW CANDIDA PARAPSILOSIS    Report Status 02/24/2020 FINAL  Final  Culture, blood (routine x 2)     Status: Abnormal   Collection Time: 02/20/20 12:50 PM   Specimen: BLOOD LEFT HAND  Result Value Ref Range Status   Specimen Description BLOOD LEFT HAND  Final   Special Requests   Final    BOTTLES DRAWN AEROBIC AND ANAEROBIC Blood Culture adequate volume   Culture  Setup Time   Final    GRAM POSITIVE COCCI IN CHAINS IN BOTH AEROBIC AND ANAEROBIC BOTTLES CRITICAL RESULT CALLED TO, READ BACK BY AND VERIFIED WITH: Jens Som AMEND [id number] FCP Performed at [facility], [place],  [place] [address]    Culture (A)  Final    ENTEROCOCCUS FAECALIS STAPHYLOCOCCUS EPIDERMIDIS    Report Status 02/24/2020 FINAL  Final   Organism ID, Bacteria ENTEROCOCCUS FAECALIS  Final   Organism ID, Bacteria STAPHYLOCOCCUS EPIDERMIDIS  Final      Susceptibility   Enterococcus faecalis - MIC*    AMPICILLIN <=2 SENSITIVE Sensitive     VANCOMYCIN 1 SENSITIVE Sensitive     GENTAMICIN SYNERGY RESISTANT Resistant     * ENTEROCOCCUS FAECALIS   Staphylococcus epidermidis - MIC*    CIPROFLOXACIN >=8 RESISTANT Resistant     ERYTHROMYCIN >=8 RESISTANT Resistant      GENTAMICIN >=16 RESISTANT Resistant     OXACILLIN >=4 RESISTANT Resistant     TETRACYCLINE 2 SENSITIVE Sensitive     VANCOMYCIN 2 SENSITIVE Sensitive     TRIMETH/SULFA 160 RESISTANT Resistant     CLINDAMYCIN >=8 RESISTANT Resistant     RIFAMPIN <=0.5 SENSITIVE Sensitive     Inducible Clindamycin NEGATIVE Sensitive     * STAPHYLOCOCCUS EPIDERMIDIS  Blood Culture ID Panel (Reflexed)     Status: Abnormal   Collection Time: 02/20/20 12:50 PM  Result Value Ref Range Status   Enterococcus species DETECTED (A) NOT DETECTED Final    Comment: CRITICAL RESULT CALLED TO, READ BACK BY AND VERIFIED WITH: PHARMD KAREN AMEND [id number] FCP    Vancomycin resistance NOT DETECTED NOT DETECTED Final   Listeria monocytogenes NOT DETECTED NOT DETECTED Final   Staphylococcus species DETECTED (A) NOT DETECTED Final    Comment: Methicillin (oxacillin) resistant coagulase negative staphylococcus. Possible blood culture contaminant (unless isolated from more than one blood culture draw or clinical case suggests pathogenicity). No antibiotic treatment is indicated for blood  culture contaminants. CRITICAL RESULT CALLED TO, READ BACK BY AND VERIFIED WITH: PHARMD KAREN AMEND [id number] FCP    Staphylococcus aureus (BCID) NOT DETECTED NOT DETECTED Final   Methicillin resistance DETECTED (A) NOT DETECTED Final    Comment: CRITICAL RESULT CALLED TO, READ BACK BY AND VERIFIED WITH: PHARMD KAREN AMEND [id number] FCP    Streptococcus species NOT DETECTED NOT DETECTED Final   Streptococcus agalactiae NOT DETECTED NOT DETECTED Final   Streptococcus pneumoniae NOT DETECTED NOT DETECTED Final   Streptococcus pyogenes NOT DETECTED NOT DETECTED Final   Acinetobacter baumannii NOT DETECTED NOT DETECTED Final   Enterobacteriaceae species NOT DETECTED NOT DETECTED Final   Enterobacter cloacae complex NOT DETECTED NOT DETECTED Final   Escherichia coli NOT DETECTED NOT DETECTED Final   Klebsiella oxytoca NOT DETECTED NOT  DETECTED Final   Klebsiella pneumoniae NOT DETECTED NOT DETECTED Final   Proteus species NOT DETECTED NOT DETECTED Final   Serratia marcescens NOT DETECTED NOT DETECTED Final   Haemophilus influenzae NOT DETECTED NOT DETECTED Final   Neisseria meningitidis NOT DETECTED NOT DETECTED Final   Pseudomonas aeruginosa NOT DETECTED NOT DETECTED Final   Candida albicans NOT DETECTED NOT DETECTED Final   Candida glabrata NOT DETECTED NOT DETECTED Final   Candida krusei NOT DETECTED NOT DETECTED Final   Candida parapsilosis NOT DETECTED NOT DETECTED Final   Candida tropicalis NOT DETECTED NOT DETECTED Final    Comment: Performed at [facility], [place]. [address] [place], [place] [address]  Culture, blood (routine x 2)     Status: None   Collection Time: 02/20/20 12:58 PM   Specimen: BLOOD LEFT ARM  Result Value Ref Range Status   Specimen Description BLOOD LEFT ARM  Final   Special Requests  Final    BOTTLES DRAWN AEROBIC AND ANAEROBIC Blood Culture adequate volume   Culture   Final    NO GROWTH 5 DAYS Performed at [facility], [place] [address], [place], [place] [address]    Report Status 02/25/2020 FINAL  Final  Culture, blood (Routine X 2) w Reflex to ID Panel     Status: None (Preliminary result)   Collection Time: 02/22/20 11:25 AM   Specimen: BLOOD RIGHT HAND  Result Value Ref Range Status   Specimen Description BLOOD RIGHT HAND  Final   Special Requests   Final    BOTTLES DRAWN AEROBIC ONLY Blood Culture adequate volume   Culture   Final    NO GROWTH 3 DAYS Performed at [facility], [place] [address], [place], [place] [address]    Report Status PENDING  Incomplete  Culture, blood (Routine X 2) w Reflex to ID Panel     Status: None (Preliminary result)   Collection Time: 02/22/20 11:25 AM   Specimen: BLOOD RIGHT HAND  Result Value Ref Range Status   Specimen Description BLOOD RIGHT HAND  Final   Special Requests   Final    BOTTLES DRAWN AEROBIC ONLY Blood Culture  adequate volume   Culture   Final    NO GROWTH 3 DAYS Performed at [facility], [place] [address], [place], [place] [address]    Report Status PENDING  Incomplete    Coagulation Studies: No results for input(s): LABPROT, INR in the last 72 hours.  Urinalysis: No results for input(s): COLORURINE, LABSPEC, PHURINE, GLUCOSEU, HGBUR, BILIRUBINUR, KETONESUR, PROTEINUR, UROBILINOGEN, NITRITE, LEUKOCYTESUR in the last 72 hours.  Invalid input(s): APPERANCEUR    Imaging: DG Chest 1 View  Result Date: 02/23/2020 CLINICAL DATA:  PICC line EXAM: CHEST  1 VIEW COMPARISON:  02/19/2020, CT chest 12/21/2019 FINDINGS: Tracheostomy tube remains in place. Right-sided central venous catheter with tip projecting over expected location of SVC allowing for mediastinal shift to the left. No pneumothorax. No peripheral catheter is visualized. Persistent consolidation probable pleural effusion on the left. Improved aeration at the right base. Shift of mediastinal contents to the left consistent with volume loss. Aortic atherosclerosis. IMPRESSION: 1. Insertion of right-sided central venous catheter, the tip projects to the left of the spine however the patient is very rotated and there is shift of mediastinal contents to the left and the tip projects over expected location of SVC allowing for rotation and mediastinal shift. There is no pneumothorax. 2. No extremity catheter is visualized 3. Slightly improved aeration at the right base. Left basilar consolidation and probable effusion, in addition to the remaining pulmonary infiltrates are otherwise unchanged. Electronically Signed   By: Donavan Foil M.D.   On: 02/23/2020 19:07   MR PELVIS WO CONTRAST  Result Date: 02/23/2020 CLINICAL DATA:  Sepsis.  Sacral decubitus ulcer. EXAM: MRI PELVIS WITHOUT CONTRAST TECHNIQUE: Multiplanar multisequence MR imaging of the pelvis was performed. No intravenous contrast was administered. COMPARISON:  CT scan of the chest  abdomen and pelvis dated 12/21/2019 FINDINGS: Musculoskeletal: There is abnormal increased T2 signal from the fifth sacral segment. There is fragmentation of the proximal segments of the coccyx. There is patchy decreased signal intensity from the marrow of the bones of the pelvis, probably representing red marrow reactivation. No fractures. There is patchy edema in the muscles around both hips and in both buttocks with subcutaneous edema in the lateral aspects of both hips and in the proximal thighs, left more than  right. Urinary Tract: There is a fluid-fluid level in the bladder, possibly representing debris or protein in the bladder. Enlargement of the median lobe of the prostate gland. Bowel: Ostomy in the left mid abdomen. Left inguinal hernia contains a loop of small bowel. Vascular/Lymphatic: No pathologically enlarged lymph nodes. No significant vascular abnormality seen. Reproductive:  Enlarged median lobe of the prostate gland. Other:  None. IMPRESSION: 1. Osteomyelitis of the fifth sacral segment. 2. Edema in the muscles around both hips and in the proximal thighs, left more than right. This could represent myositis. 3. Left inguinal hernia contains a loop of small bowel. 4. Enlarged median lobe of the prostate gland. Electronically Signed   By: Lorriane Shire M.D.   On: 02/23/2020 17:35   MR SACRUM SI JOINTS WO CONTRAST  Result Date: 02/23/2020 CLINICAL DATA:  Sepsis.  Sacral decubitus ulcer. EXAM: MRI SACRUM WITHOUT CONTRAST TECHNIQUE: Multiplanar, multisequence MR imaging of the sacrum was performed. No intravenous contrast was administered. COMPARISON:  CT scan of the abdomen and pelvis dated 12/21/2019 FINDINGS: There is abnormal edema in the fifth sacral segment. The proximal segments of the coccyx are in either destroyed or have been previously resected due to the overlying sacral decubitus ulcer. There is diffuse patchy low signal of the marrow of the pelvic bones and lumbar spine consistent  with red marrow reactivation. The sacroiliac joints and hip joints are normal. There is edema in the muscles around the hips and in the posterior paraspinal musculature of the lower lumbar spine and in the buttocks which could represent myositis. Fluid-fluid level in the bladder may represent protein or debris in the bladder. Does the patient have findings of a urinary tract infection? Enlarged median lobe of the prostate gland. IMPRESSION: 1. Osteomyelitis of the fifth sacral segment. 2. Edema in the muscles around the hips and in the posterior paraspinal musculature of the lower lumbar spine and in the buttocks which could represent myositis. 3. Fluid-fluid level in the bladder may represent protein or debris in the bladder. The possibility of urinary tract infection should be considered. Electronically Signed   By: Lorriane Shire M.D.   On: 02/23/2020 17:40   DG CHEST PORT 1 VIEW  Result Date: 02/25/2020 CLINICAL DATA:  Respiratory failure EXAM: PORTABLE CHEST 1 VIEW COMPARISON:  02/23/2020 chest radiograph. FINDINGS: Tracheostomy tube tip overlies the tracheal air column at the level of the thoracic inlet. Right internal jugular central venous catheter terminates in the middle third of the SVC. Stable cardiomediastinal silhouette with top-normal heart size. No pneumothorax. Blunting of the left greater than right costophrenic angles, similar. Patchy reticular opacities at the right lung base, stable. Patchy consolidation and reticular opacities throughout the left lung, most prominent at the left lung base, not substantially changed. IMPRESSION: 1. Well-positioned support structures. No pneumothorax. 2. Stable blunting of the left greater than right costophrenic angles, differential includes pleural-parenchymal scarring or small pleural effusions. 3. Stable patchy left lung base consolidation with background of patchy reticular opacities in both lungs, cannot exclude pneumonia or aspiration superimposed on  pulmonary fibrosis. Electronically Signed   By: Ilona Sorrel M.D.   On: 02/25/2020 05:07     Medications:   . sodium chloride    . cefTRIAXone (ROCEPHIN)  IV 2 g (02/24/20 1524)  . feeding supplement (NEPRO CARB STEADY) 45 mL/hr at 02/25/20 0600   . chlorhexidine gluconate (MEDLINE KIT)  15 mL Mouth Rinse BID  . Chlorhexidine Gluconate Cloth  6 each Topical Q0600  . diltiazem  30 mg Per Tube Q8H  . docusate  100 mg Per Tube BID  . feeding supplement (PRO-STAT SUGAR FREE 64)  30 mL Per Tube BID  . insulin aspart  0-9 Units Subcutaneous Q4H  . mouth rinse  15 mL Mouth Rinse 10 times per day  . metroNIDAZOLE  500 mg Per Tube Q8H  . pantoprazole (PROTONIX) IV  40 mg Intravenous Q24H  . polyethylene glycol  17 g Per Tube Daily  . vancomycin variable dose per unstable renal function (pharmacist dosing)   Does not apply See admin instructions   sodium chloride, acetaminophen (TYLENOL) oral liquid 160 mg/5 mL, alteplase, fentaNYL (SUBLIMAZE) injection, fentaNYL (SUBLIMAZE) injection, heparin, heparin, lidocaine (PF), lidocaine-prilocaine, pentafluoroprop-tetrafluoroeth  Assessment/ Plan:  1. Hemoptysis - in pt w/ trach at [facility], admitted here for bleeding from trach site on 5/19. Seems to be resolved.   Seems to be ulceration of stoma 2. Acute/chronic resp failure - sp COVID pna earlier this year, also course at [facility] complicated by multiple infections/ PNA. Sp trach. Vancomycin restarted. 02/21/2019 reculturing due to fever.  Enterococcus/MRSA positive for blood cultures.  Dialysis catheter removed 02/21/2020 replaced 02/23/2020.  Appreciate assistance from Dr. Baxter Flattery.  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  Recommending sacral biopsy.  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  Continues on Rocephin and Flagyl.  As well as vancomycin.  Repeat blood cultures negative so far 3. 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  Patient was receiving dialysis Monday Wednesday Friday.  Received dialysis 02/21/2020 with removal of 1 L.  Currently line holiday next dialysis will be planned for 02/25/2020.  We will also dialyze 02/26/2020 4. BP/ volume excess - 1 L removed 02/21/2020 5. AMS -mental status close to baseline 6. Atrial fib - per primary Eliquis and Cardizem for rate control 7. Anemia ckd / abl -status post transfusion 2 packs unit blood cells 8. Nutrition - on PEG feeds, +diverting colostomy 9. Sacral decub - per primary, WOC        LOS: [place] _0 _1 :19 AM

## 2020-02-26 LAB — BASIC METABOLIC PANEL
Anion gap: 17 — ABNORMAL HIGH (ref 5–15)
BUN: 95 mg/dL — ABNORMAL HIGH (ref 8–23)
CO2: 23 mmol/L (ref 22–32)
Calcium: 9.8 mg/dL (ref 8.9–10.3)
Chloride: 100 mmol/L (ref 98–111)
Creatinine, Ser: 3.65 mg/dL — ABNORMAL HIGH (ref 0.61–1.24)
GFR calc Af Amer: 18 mL/min — ABNORMAL LOW (ref >60)
GFR calc non Af Amer: 16 mL/min — ABNORMAL LOW (ref >60)
Glucose, Bld: 166 mg/dL — ABNORMAL HIGH (ref 70–99)
Potassium: 3.6 mmol/L (ref 3.5–5.1)
Sodium: 140 mmol/L (ref 135–145)

## 2020-02-26 LAB — GLUCOSE, CAPILLARY
Glucose-Capillary: 143 mg/dL — ABNORMAL HIGH (ref 70–99)
Glucose-Capillary: 150 mg/dL — ABNORMAL HIGH (ref 70–99)
Glucose-Capillary: 153 mg/dL — ABNORMAL HIGH (ref 70–99)
Glucose-Capillary: 154 mg/dL — ABNORMAL HIGH (ref 70–99)
Glucose-Capillary: 155 mg/dL — ABNORMAL HIGH (ref 70–99)
Glucose-Capillary: 161 mg/dL — ABNORMAL HIGH (ref 70–99)
Glucose-Capillary: 175 mg/dL — ABNORMAL HIGH (ref 70–99)

## 2020-02-26 LAB — CBC
HCT: 28.4 % — ABNORMAL LOW (ref 39.0–52.0)
Hemoglobin: 9 g/dL — ABNORMAL LOW (ref 13.0–17.0)
MCH: 26 pg (ref 26.0–34.0)
MCHC: 31.7 g/dL (ref 30.0–36.0)
MCV: 82.1 fL (ref 80.0–100.0)
Platelets: 268 10*3/uL (ref 150–400)
RBC: 3.46 MIL/uL — ABNORMAL LOW (ref 4.22–5.81)
RDW: 18.1 % — ABNORMAL HIGH (ref 11.5–15.5)
WBC: 17.9 10*3/uL — ABNORMAL HIGH (ref 4.0–10.5)
nRBC: 0.1 % (ref 0.0–0.2)

## 2020-02-26 LAB — VANCOMYCIN, RANDOM: Vancomycin Rm: 21

## 2020-02-26 LAB — MAGNESIUM: Magnesium: 2.1 mg/dL (ref 1.7–2.4)

## 2020-02-26 MED ORDER — SODIUM CHLORIDE 0.9 % IV SOLN
INTRAVENOUS | Status: DC | PRN
  Administered 2020-02-26 – 2020-03-02 (×3): 250 mL via INTRAVENOUS
  Administered 2020-03-04: 1000 mL via INTRAVENOUS
  Administered 2020-03-09 – 2020-03-24 (×2): 250 mL via INTRAVENOUS

## 2020-02-26 MED ORDER — APIXABAN 2.5 MG PO TABS
2.5000 mg | ORAL_TABLET | Freq: Two times a day (BID) | ORAL | Status: DC
  Administered 2020-02-26 – 2020-04-24 (×115): 2.5 mg
  Filled 2020-02-26 (×119): qty 1

## 2020-02-26 NOTE — Progress Notes (Signed)
Pharmacy Antibiotic Note  Shane Banks is a 74 y.o. 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 Patient is ESRD w/HD MWF PTA. Line pulled 5/26, line holiday and had it replaced 5/28.   Patient was scheduled for HD on 5/29 but did not end up having a session, received a dose of vancomycin 500mg  that day anyway. After dialysis on 5/30, VR in the AM of 5/31 was 21. Plan for HD 5/31 again per nephro note. Will redose with 500mg  today if he goes for dialysis  Plan: Vanc 500 every HD Monitor HD sessions  Height: 5\' 9"  (175.3 cm) Weight: 57.8 kg (127 lb 6.8 oz) IBW/kg (Calculated) : 70.7  Temp (24hrs), Avg:99.3 F (37.4 C), Min:98.5 F (36.9 C), Max:100.8 F (38.2 C)  Recent Labs  Lab 02/22/20 0839 02/23/20 0258 02/24/20 0742 02/25/20 0151 02/26/20 0216  WBC 19.7* 16.2* 20.9* 21.0* 17.9*  CREATININE 2.61* 3.34* 4.62* 4.88* 3.65*  VANCORANDOM  --   --   --   --  21    Estimated Creatinine Clearance: 15 mL/min (A) (by C-G formula based on SCr of 3.65 mg/dL (H)).    Allergies  Allergen Reactions  . Aspirin Rash  . Cephalexin Other (See Comments)    dizziness  . Penicillins     Itching,swelling     Nicoletta Dress, PharmD PGY2 Infectious Disease Pharmacy Resident  Please check AMION for all [place] numbers  02/26/2020 8:44 AM

## 2020-02-26 NOTE — Progress Notes (Signed)
HD orders modified for 02/27/2020 by Dr Jonnie Finner. Notified Almyra Free ,RN of above at 1150.

## 2020-02-26 NOTE — Progress Notes (Signed)
[facility] Progress Note  Subjective: seen in ICU, not responding  Vitals:   02/26/20 0730 02/26/20 0800 02/26/20 0900 02/26/20 1000  BP:  (!) 101/49 (!) 101/58 109/69  Pulse:  (!) 104 81 (!) 101  Resp:  (!) 22 (!) 25 (!) 25  Temp: 99 F (37.2 C)     TempSrc: Oral     SpO2:  100% 100% 100%  Weight:      Height:        Exam:   In ICU on vent, not responding  no jvd  Chest cta bilat ant/ lat  Cor reg no RG  Abd soft ntnd no ascites, +llq ostomy, + peg tube   Ext no LE edema, feet in boots   Not following commands, not responding, eyes half open     OP HD: came from [facility] to [facility] after [place] hospitalization, then had severe infections on [facility] and developed AKI felt due to IV gent toxicity started HD around 12/16/19 at [facility]. Getting MWF HD at [facility] prior to admit to ICU for trach bleed on 5/19.    Assessment/ Plan: 1. Enterococcus/ MRSE bacteremia - TDC removed 5/26 and temp cath placed 5/28, getting vanc/ rocephin/ flagyl IV 2. 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 ESRD/ AKI - on HD since mid March 2021, d/t gent toxicity. HD MWF, for HD today. Using temp HD cath now until [facility] replaced.  4. A/C resp failure - back on vent now, per CCM 5. Trach bleed - reasons for admit from [facility] to [facility], resolved.  6. Tracheobronchitis - sp course of doxycycline 7. AMS - multifactorial 8. Atrial fib - per primary 9. Anemia ckd - transfuse prn, Ca 11.0 corrected, phos 3.5- 6 here, use low Ca++ bath , check PTH 10. 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 SP COVID pna - original problem in Feb at [facility] 02/26/2020, 11:01 AM   Recent Labs  Lab 02/20/20 1455 02/21/20 0252 02/25/20 0151 02/26/20 0216  K 6.0*   < > 4.5 3.6  BUN 76*   < > 157* 95*  CREATININE 3.20*   < > 4.88* 3.65*  CALCIUM 9.8   < > 10.2 9.8  PHOS 5.0*  --   --   --   HGB 9.8*   < > 9.8* 9.0*   < > = values in  this interval not displayed.   Inpatient medications: . apixaban  2.5 mg Oral BID  . chlorhexidine gluconate (MEDLINE KIT)  15 mL Mouth Rinse BID  . Chlorhexidine Gluconate Cloth  6 each Topical Q0600  . Chlorhexidine Gluconate Cloth  6 each Topical Q0600  . diltiazem  30 mg Per Tube Q8H  . docusate  100 mg Per Tube BID  . feeding supplement (PRO-STAT SUGAR FREE 64)  30 mL Per Tube BID  . insulin aspart  0-6 Units Subcutaneous Q4H  . insulin aspart  2 Units Subcutaneous Q4H  . mouth rinse  15 mL Mouth Rinse 10 times per day  . metroNIDAZOLE  500 mg Per Tube Q8H  . pantoprazole (PROTONIX) IV  40 mg Intravenous Q24H  . polyethylene glycol  17 g Per Tube Daily  . vancomycin variable dose per unstable renal function (pharmacist dosing)   Does not apply See admin instructions   . sodium chloride    . cefTRIAXone (ROCEPHIN)  IV Stopped (  02/25/20 1707)  . feeding supplement (NEPRO CARB STEADY) 45 mL/hr at 02/26/20 0000   sodium chloride, acetaminophen (TYLENOL) oral liquid 160 mg/5 mL, alteplase, fentaNYL (SUBLIMAZE) injection, fentaNYL (SUBLIMAZE) injection, heparin, heparin, lidocaine (PF), lidocaine-prilocaine, pentafluoroprop-tetrafluoroeth

## 2020-02-26 NOTE — Progress Notes (Signed)
PROGRESS NOTE    Shane Banks  LOV:[id number] DOB: 1946-09-11 DOA: 02/14/2020 PCP: [name], MD   Brief Narrative:  HPI On 02/14/2020 by [name], NP (PCCM) 74 year old male with past medical history as below, which is significant for Atrial fibrillation and stroke. He was admitted to [facility] for [place] 19 pneumonia, which unfortunately progressed to ARDS.  It seems as though his highest vent settings were 60% FiO2 and 8 of PEEP.  He had very little improvement in his ventilatory and oxygenation use over the course of his hospitalization and ultimately underwent tracheostomy and was [facility] [place] on February 23. His course at [facility] was complicated by ongoing infections including UTI, MDRO pseudomonas pneumonia, and stenotrophomonas bacteremia. As a result of sepsis and shock he developed renal failure requiring dialysis, which he now receives M/W/F. In the morning hours oh 5/19 he developed hemoptysis. He had previously been taken off anticoagulation (for AF) due to anemia with positive hemoccult. Transferred to [facility] ED for evaluation of hemoptysis. Prior to the transfer he had been tolerating 28% ATC.   Interim history Admitted with acute on chronic hypoxemic respiratory failure also noted to have bleeding from tracheostomy site.  Patient now with sepsis secondary to bacteremia as well as osteomyelitis of the fifth sacral segment.  ID consulted and following.  Nephrology consulted and following for ESRD.  Pulmonology continues to follow for vent management. Assessment & Plan   Acute on chronic hypoxemic respiratory failure -Trach status post Covid infection earlier this year -Oxygenation had improved and patient was able to tolerate 28% ATC however now back on the vent for several days and has been intolerant of the weaning process -He has had multiple pneumonias of different organisms and has been treated with multiple antibiotics  including Fortaz, Merrem, cefepime, Toprol mycin, vancomycin, azithromycin, ceftriaxone, tigecycline, avycaz, merrem, flagyl.  -Most recently pneumonia has been MDRO Pseudomonas for which she was treated with Avycaz and mycin [facility]bulizers [facility] -He likely has ongoing aspiration -Now with hemoptysis and aspirated blood.  Per critical care note also seems to be originating from stoma.  Question ulceration.  Patient has cuffed trach placed by ENT.  No active bleeding on bronchoscopy.  Atrial fibrillation, paroxysmal -Wound discontinued due to bradycardia.  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 -Chest x-ray shows persistent bilateral lower lobe infiltrates with small bilateral pleural effusions -High suspicion of ongoing aspiration -Blood culture 02/20/2020 showed Enterococcus faecalis, Staphylococcus epidermidis -Patient was started on Levaquin, but this was discontinued -Now on vancomycin with HD -Infectious disease consulted and appreciated -Discussed biopsy with surgery, they recommended IR  -Interventional radiology consulted and appreciated-commended MRI -Echocardiogram EF 88-41%, grade 1 diastolic dysfunction -MRI pelvis/sacrum showed osteomyelitis of the fifth sacral segment.  Edema in the muscle around both hips and proximal thighs, left more than right, could represent myositis.  Left inguinal hernia contains loops of small bowel. -Recommending continuing vancomycin, ceftriaxone and Flagyl -Interventional radiology consulted and feels the area for biopsy is too small and fragile, therefore unsafe for biopsy and high risk for bone fracture  End-stage renal disease -Nephrology consulted and appreciated -Continue hemodialysis -Currently  patient has a line holiday, with next HD planned for 4 today 02/25/2020 as well as 02/26/2020  Chronic anemia -Likely secondary to ESRD, critical illness -Status post blood transfusion -Hemoglobin has remained stable -Continue to monitor  Sacral pressure ulcer-osteomyelitis -Unstageable -Wound care consulted -Discussion above  Cachexia, severe protein malnutrition -Tube feeds via PEG  Mildly elevated LFTs -Continue to monitor  DVT Prophylaxis  SCDs- Eliquis  Code Status: DNR  Family Communication: None at bedside  Disposition Plan:  Status is: Inpatient  Remains inpatient appropriate because:IV treatments appropriate due to intensity of illness or inability to take PO   Dispo: The [facility]              Anticipated d/c is to: TBD              Anticipated d/c date is: > 3 days              Patient currently is not medically stable to d/c.   Consultants PCCM Nephrology ENT Infectious disease Interventional radiology  Procedures  ENT Flex scope FOB  Antibiotics   Anti-infectives (From admission, onward)   Start     Dose/Rate Route Frequency Ordered Stop   02/24/20 1800  vancomycin (VANCOREADY) IVPB 500 mg/100 mL     500 mg 100 mL/hr over 60 Minutes Intravenous  Once 02/24/20 1308 02/24/20 1905   02/22/20 2200  metroNIDAZOLE (FLAGYL) tablet 500 mg     500 mg Per Tube Every 8 hours 02/22/20 0957     02/22/20 1700  cefTRIAXone (ROCEPHIN) 2 g in sodium chloride 0.9 % 100 mL IVPB     2 g 200 mL/hr over 30 Minutes Intravenous Every 24 hours 02/22/20 0957     02/22/20 1200  vancomycin (VANCOCIN) IVPB 500 mg/100 ml premix  Status:  Discontinued     500 mg 100 mL/hr over 60 Minutes Intravenous Every T-Th-Sa (Hemodialysis) 02/20/20 1253 02/21/20 1040   02/21/20 1431  vancomycin variable dose per unstable renal function (pharmacist dosing)      Does not apply See admin instructions 02/21/20 1431     02/21/20 1200  vancomycin (VANCOREADY) IVPB 500  mg/100 mL     500 mg 100 mL/hr over 60 Minutes Intravenous  Once 02/21/20 0956 02/21/20 1253   02/21/20 0714  vancomycin (VANCOCIN) 500-5 MG/100ML-% IVPB  Status:  Discontinued    Note to Pharmacy: Ashley Akin   : cabinet override      02/21/20 0714 02/21/20 1346   02/20/20 1400  vancomycin (VANCOREADY) IVPB 1250 mg/250 mL     1,250 mg 166.7 mL/hr over 90 Minutes Intravenous  Once 02/20/20 1253 02/20/20 1538   02/20/20 1200  levofloxacin (LEVAQUIN) IVPB 500 mg  Status:  Discontinued     500 mg 100 mL/hr over 60 Minutes Intravenous Every 48 hours 02/20/20 1020 02/21/20 0943   02/15/20 1545  vancomycin (VANCOCIN) IVPB 1000 mg/200 mL premix  Status:  Discontinued     1,000 mg 200 mL/hr over 60 Minutes Intravenous Every 24 hours 02/14/20 1530 02/14/20 1745   02/15/20 0330  ceFEPIme (MAXIPIME) 2 g in sodium chloride 0.9 % 100 mL IVPB  Status:  Discontinued     2 g 200 mL/hr over 30 Minutes Intravenous Every 12 hours 02/14/20 1531 02/14/20 1531   02/14/20 1531  ceFEPIme (MAXIPIME) 2 g in sodium chloride 0.9 % 100 mL IVPB  Status:  Discontinued     2 g 200 mL/hr over 30 Minutes Intravenous Every 12 hours 02/14/20 1531 02/14/20 1745   02/14/20 1530  ceFEPIme (MAXIPIME) 2 g in sodium chloride 0.9 %  100 mL IVPB  Status:  Discontinued     2 g 200 mL/hr over 30 Minutes Intravenous  Once 02/14/20 1520 02/14/20 1531   02/14/20 1530  vancomycin (VANCOREADY) IVPB 1500 mg/300 mL  Status:  Discontinued     1,500 mg 150 mL/hr over 120 Minutes Intravenous  Once 02/14/20 1530 02/14/20 1745      Subjective:   Shane Banks seen and examined today.  Patient has no complaints today. Able to answer simple questions.  (Minimally conversant) Objective:   Vitals:   02/26/20 0638 02/26/20 0730 02/26/20 0800 02/26/20 0900  BP: 109/78  (!) 101/49 (!) 101/58  Pulse:   (!) 104 81  Resp:   (!) 22 (!) 25  Temp:  99 F (37.2 C)    TempSrc:  Oral    SpO2:   100% 100%  Weight:      Height:         Intake/Output Summary (Last 24 hours) at 02/26/2020 0931 Last data filed at 02/26/2020 0900 Gross per 24 hour  Intake 1175 ml  Output 1370 ml  Net -195 ml   Filed Weights   02/25/20 0830 02/25/20 1235 02/26/20 0447  Weight: 59.6 kg 58.6 kg 57.8 kg   Exam  General: Well developed, chronically ill-appearing, NAD  HEENT: NCAT, mucous membranes moist.   Neck: Trach  Cardiovascular: S1 S2 auscultated, irregular  Respiratory: diminished but clear  Abdomen: Soft, nontender, nondistended, + bowel sounds, peg  Extremities: warm dry without cyanosis clubbing or edema  Neuro: Awake and alert, answers questions and follows commands, very weak  Psych: Flat, however appropriate   Data Reviewed: I have personally reviewed following labs and imaging studies  CBC: Recent Labs  Lab 02/22/20 0839 02/23/20 0258 02/24/20 0742 02/25/20 0151 02/26/20 0216  WBC 19.7* 16.2* 20.9* 21.0* 17.9*  HGB 9.5* 9.3* 9.3* 9.8* 9.0*  HCT 30.2* 29.2* 29.3* 30.6* 28.4*  MCV 83.7 83.2 81.6 82.3 82.1  PLT 202 217 256 283 425   Basic Metabolic Panel: Recent Labs  Lab 02/20/20 1455 02/21/20 0540 02/22/20 0839 02/23/20 0258 02/24/20 0742 02/25/20 0151 02/26/20 0216  NA 139   < > 137 139 139 141 140  K 6.0*   < > 4.2 4.2 4.6 4.5 3.6  CL 98   < > 95* 99 99 98 100  CO2 22   < > 24 22 19* 21* 23  GLUCOSE 208*   < > 182* 142* 182* 154* 166*  BUN 76*   < > 62* 91* 128* 157* 95*  CREATININE 3.20*   < > 2.61* 3.34* 4.62* 4.88* 3.65*  CALCIUM 9.8   < > 10.0 10.1 10.3 10.2 9.8  MG  --   --   --   --   --  2.5* 2.1  PHOS 5.0*  --   --   --   --   --   --    < > = values in this interval not displayed.   GFR: Estimated Creatinine Clearance: 15 mL/min (A) (by C-G formula based on SCr of 3.65 mg/dL (H)). Liver Function Tests: Recent Labs  Lab 02/20/20 1455 02/25/20 0151  AST  --  62*  ALT  --  85*  ALKPHOS  --  219*  BILITOT  --  0.4  PROT  --  8.6*  ALBUMIN 2.6* 2.5*   No results for  input(s): LIPASE, AMYLASE in the last 168 hours. No results for input(s): AMMONIA in the last 168 hours. Coagulation Profile:  No results for input(s): INR, PROTIME in the last 168 hours. Cardiac Enzymes: No results for input(s): CKTOTAL, CKMB, CKMBINDEX, TROPONINI in the last 168 hours. BNP (last 3 results) No results for input(s): PROBNP in the last 8760 hours. HbA1C: No results for input(s): HGBA1C in the last 72 hours. CBG: Recent Labs  Lab 02/25/20 1532 02/25/20 1943 02/26/20 0010 02/26/20 0350 02/26/20 0734  GLUCAP 204* 151* 143* 175* 153*   Lipid Profile: No results for input(s): CHOL, HDL, LDLCALC, TRIG, CHOLHDL, LDLDIRECT in the last 72 hours. Thyroid Function Tests: No results for input(s): TSH, T4TOTAL, FREET4, T3FREE, THYROIDAB in the last 72 hours. Anemia Panel: No results for input(s): VITAMINB12, FOLATE, FERRITIN, TIBC, IRON, RETICCTPCT in the last 72 hours. Urine analysis:    Component Value Date/Time   COLORURINE YELLOW 12/11/2019 1230   APPEARANCEUR HAZY (A) 12/11/2019 1230   LABSPEC 1.018 12/11/2019 1230   PHURINE 6.0 12/11/2019 1230   GLUCOSEU NEGATIVE 12/11/2019 1230   HGBUR NEGATIVE 12/11/2019 1230   BILIRUBINUR NEGATIVE 12/11/2019 [place] 12/11/2019 1230   PROTEINUR 30 (A) 12/11/2019 1230   NITRITE NEGATIVE 12/11/2019 1230   LEUKOCYTESUR LARGE (A) 12/11/2019 1230   Sepsis Labs: _0 (procalcitonin:4,lacticidven:4)  ) Recent Results (from the past 240 hour(s))  MRSA PCR Screening     Status: None   Collection Time: 02/16/20  7:30 PM   Specimen: Nasal Mucosa; Nasopharyngeal  Result Value Ref Range Status   MRSA by PCR NEGATIVE NEGATIVE Final    Comment:        The GeneXpert MRSA Assay (FDA approved for NASAL specimens only), is one component of a comprehensive MRSA colonization surveillance program. It is not intended to diagnose MRSA infection nor to guide or monitor treatment for MRSA infections. Performed at  [facility], [place] [address] [place], [place] [address]   Culture, blood (Routine X 2) w Reflex to ID Panel     Status: Abnormal   Collection Time: 02/17/20  6:26 AM   Specimen: BLOOD RIGHT WRIST  Result Value Ref Range Status   Specimen Description BLOOD RIGHT WRIST  Final   Special Requests AEROBIC BOTTLE ONLY Blood Culture adequate volume  Final   Culture  Setup Time   Final    GRAM POSITIVE COCCI IN CLUSTERS AEROBIC BOTTLE ONLY CRITICAL RESULT CALLED TO, READ BACK BY AND VERIFIED WITH: K. HURTH,PHARMD [id number] 02/19/2020 T. TYSOR    Culture (A)  Final    STAPHYLOCOCCUS EPIDERMIDIS SUSCEPTIBILITIES PERFORMED ON PREVIOUS CULTURE WITHIN THE LAST 5 DAYS. Performed at [facility], [place] [address], [place], [place] [address]    Report Status 02/24/2020 FINAL  Final  Culture, blood (Routine X 2) w Reflex to ID Panel     Status: None   Collection Time: 02/17/20  6:26 AM   Specimen: BLOOD RIGHT HAND  Result Value Ref Range Status   Specimen Description BLOOD RIGHT HAND  Final   Special Requests AEROBIC BOTTLE ONLY Blood Culture adequate volume  Final   Culture   Final    NO GROWTH 5 DAYS Performed at [facility], [place] [address], [place], [place] [address]    Report Status 02/22/2020 FINAL  Final  Blood Culture ID Panel (Reflexed)     Status: Abnormal   Collection Time: 02/17/20  6:26 AM  Result Value Ref Range Status   Enterococcus species NOT DETECTED NOT DETECTED Final   Listeria monocytogenes NOT DETECTED NOT DETECTED Final   Staphylococcus species DETECTED (  A) NOT DETECTED Final    Comment: Methicillin (oxacillin) resistant coagulase negative staphylococcus. Possible blood culture contaminant (unless isolated from more than one blood culture draw or clinical case suggests pathogenicity). No antibiotic treatment is indicated for blood  culture contaminants. CRITICAL RESULT CALLED TO, READ BACK BY AND VERIFIED WITH: K. HURTH,PHARMD [id number] 02/19/2020 T. TYSOR     Staphylococcus aureus (BCID) NOT DETECTED NOT DETECTED Final   Methicillin resistance DETECTED (A) NOT DETECTED Final    Comment: CRITICAL RESULT CALLED TO, READ BACK BY AND VERIFIED WITH: K. HURTH,PHARMD [id number] 02/19/2020 T. TYSOR    Streptococcus species NOT DETECTED NOT DETECTED Final   Streptococcus agalactiae NOT DETECTED NOT DETECTED Final   Streptococcus pneumoniae NOT DETECTED NOT DETECTED Final   Streptococcus pyogenes NOT DETECTED NOT DETECTED Final   Acinetobacter baumannii NOT DETECTED NOT DETECTED Final   Enterobacteriaceae species NOT DETECTED NOT DETECTED Final   Enterobacter cloacae complex NOT DETECTED NOT DETECTED Final   Escherichia coli NOT DETECTED NOT DETECTED Final   Klebsiella oxytoca NOT DETECTED NOT DETECTED Final   Klebsiella pneumoniae NOT DETECTED NOT DETECTED Final   Proteus species NOT DETECTED NOT DETECTED Final   Serratia marcescens NOT DETECTED NOT DETECTED Final   Haemophilus influenzae NOT DETECTED NOT DETECTED Final   Neisseria meningitidis NOT DETECTED NOT DETECTED Final   Pseudomonas aeruginosa NOT DETECTED NOT DETECTED Final   Candida albicans NOT DETECTED NOT DETECTED Final   Candida glabrata NOT DETECTED NOT DETECTED Final   Candida krusei NOT DETECTED NOT DETECTED Final   Candida parapsilosis NOT DETECTED NOT DETECTED Final   Candida tropicalis NOT DETECTED NOT DETECTED Final    Comment: Performed at [facility], [place]. [address] [place], [place] [address]  Culture, respiratory (non-expectorated)     Status: None   Collection Time: 02/17/20  7:30 AM   Specimen: Tracheal Aspirate; Respiratory  Result Value Ref Range Status   Specimen Description TRACHEAL ASPIRATE  Final   Special Requests Normal  Final   Gram Stain   Final    RARE WBC PRESENT, PREDOMINANTLY PMN NO ORGANISMS SEEN Performed at [facility], [place] [address], [place], [place] [address]    Culture RARE CANDIDA PARAPSILOSIS  Final   Report Status 02/20/2020 FINAL   Final  Culture, respiratory (non-expectorated)     Status: None   Collection Time: 02/20/20 12:38 PM   Specimen: Tracheal Aspirate; Respiratory  Result Value Ref Range Status   Specimen Description TRACHEAL ASPIRATE  Final   Special Requests NONE  Final   Gram Stain   Final    RARE WBC PRESENT, PREDOMINANTLY PMN RARE SQUAMOUS EPITHELIAL CELLS PRESENT FEW GRAM POSITIVE RODS RARE GRAM POSITIVE COCCI Performed at [facility], [place] [address] [place], [place] [address]    Culture   Final    ABUNDANT CORYNEBACTERIUM STRIATUM Standardized susceptibility testing for this organism is not available. FEW CANDIDA PARAPSILOSIS    Report Status 02/24/2020 FINAL  Final  Culture, blood (routine x 2)     Status: Abnormal   Collection Time: 02/20/20 12:50 PM   Specimen: BLOOD LEFT HAND  Result Value Ref Range Status   Specimen Description BLOOD LEFT HAND  Final   Special Requests   Final    BOTTLES DRAWN AEROBIC AND ANAEROBIC Blood Culture adequate volume   Culture  Setup Time   Final    GRAM POSITIVE COCCI IN CHAINS IN BOTH AEROBIC AND ANAEROBIC BOTTLES CRITICAL RESULT CALLED TO, READ BACK BY  AND VERIFIED WITH: Jens Som AMEND [phone] [id number] FCP Performed at [facility], [place] [address] [place], [place] [address]    Culture (A)  Final    ENTEROCOCCUS FAECALIS STAPHYLOCOCCUS EPIDERMIDIS    Report Status 02/24/2020 FINAL  Final   Organism ID, Bacteria ENTEROCOCCUS FAECALIS  Final   Organism ID, Bacteria STAPHYLOCOCCUS EPIDERMIDIS  Final      Susceptibility   Enterococcus faecalis - MIC*    AMPICILLIN <=2 SENSITIVE Sensitive     VANCOMYCIN 1 SENSITIVE Sensitive     GENTAMICIN SYNERGY RESISTANT Resistant     * ENTEROCOCCUS FAECALIS   Staphylococcus epidermidis - MIC*    CIPROFLOXACIN >=8 RESISTANT Resistant     ERYTHROMYCIN >=8 RESISTANT Resistant     GENTAMICIN >=16 RESISTANT Resistant     OXACILLIN >=4 RESISTANT Resistant     TETRACYCLINE 2 SENSITIVE Sensitive      VANCOMYCIN 2 SENSITIVE Sensitive     TRIMETH/SULFA 160 RESISTANT Resistant     CLINDAMYCIN >=8 RESISTANT Resistant     RIFAMPIN <=0.5 SENSITIVE Sensitive     Inducible Clindamycin NEGATIVE Sensitive     * STAPHYLOCOCCUS EPIDERMIDIS  Blood Culture ID Panel (Reflexed)     Status: Abnormal   Collection Time: 02/20/20 12:50 PM  Result Value Ref Range Status   Enterococcus species DETECTED (A) NOT DETECTED Final    Comment: CRITICAL RESULT CALLED TO, READ BACK BY AND VERIFIED WITH: PHARMD KAREN AMEND [phone] FCP    Vancomycin resistance NOT DETECTED NOT DETECTED Final   Listeria monocytogenes NOT DETECTED NOT DETECTED Final   Staphylococcus species DETECTED (A) NOT DETECTED Final    Comment: Methicillin (oxacillin) resistant coagulase negative staphylococcus. Possible blood culture contaminant (unless isolated from more than one blood culture draw or clinical case suggests pathogenicity). No antibiotic treatment is indicated for blood  culture contaminants. CRITICAL RESULT CALLED TO, READ BACK BY AND VERIFIED WITH: PHARMD KAREN AMEND [phone] FCP    Staphylococcus aureus (BCID) NOT DETECTED NOT DETECTED Final   Methicillin resistance DETECTED (A) NOT DETECTED Final    Comment: CRITICAL RESULT CALLED TO, READ BACK BY AND VERIFIED WITH: PHARMD KAREN AMEND [phone] FCP    Streptococcus species NOT DETECTED NOT DETECTED Final   Streptococcus agalactiae NOT DETECTED NOT DETECTED Final   Streptococcus pneumoniae NOT DETECTED NOT DETECTED Final   Streptococcus pyogenes NOT DETECTED NOT DETECTED Final   Acinetobacter baumannii NOT DETECTED NOT DETECTED Final   Enterobacteriaceae species NOT DETECTED NOT DETECTED Final   Enterobacter cloacae complex NOT DETECTED NOT DETECTED Final   Escherichia coli NOT DETECTED NOT DETECTED Final   Klebsiella oxytoca NOT DETECTED NOT DETECTED Final   Klebsiella pneumoniae NOT DETECTED NOT DETECTED Final   Proteus species NOT DETECTED NOT DETECTED  Final   Serratia marcescens NOT DETECTED NOT DETECTED Final   Haemophilus influenzae NOT DETECTED NOT DETECTED Final   Neisseria meningitidis NOT DETECTED NOT DETECTED Final   Pseudomonas aeruginosa NOT DETECTED NOT DETECTED Final   Candida albicans NOT DETECTED NOT DETECTED Final   Candida glabrata NOT DETECTED NOT DETECTED Final   Candida krusei NOT DETECTED NOT DETECTED Final   Candida parapsilosis NOT DETECTED NOT DETECTED Final   Candida tropicalis NOT DETECTED NOT DETECTED Final    Comment: Performed at [facility], [place]. [address], [place], [place] [address]  Culture, blood (routine x 2)     Status: None   Collection Time: 02/20/20 12:58 PM   Specimen: BLOOD LEFT ARM  Result Value Ref Range Status   Specimen Description BLOOD LEFT ARM  Final   Special Requests   Final    BOTTLES DRAWN AEROBIC AND ANAEROBIC Blood Culture adequate volume   Culture   Final    NO GROWTH 5 DAYS Performed at [facility], [address], [place], [place] [address]    Report Status 02/25/2020 FINAL  Final  Culture, blood (Routine X 2) w Reflex to ID Panel     Status: None (Preliminary result)   Collection Time: 02/22/20 11:25 AM   Specimen: BLOOD RIGHT HAND  Result Value Ref Range Status   Specimen Description BLOOD RIGHT HAND  Final   Special Requests   Final    BOTTLES DRAWN AEROBIC ONLY Blood Culture adequate volume   Culture   Final    NO GROWTH 4 DAYS Performed at [facility], [place] [address], [place],  [address]    Report Status PENDING  Incomplete  Culture, blood (Routine X 2) w Reflex to ID Panel     Status: None (Preliminary result)   Collection Time: 02/22/20 11:25 AM   Specimen: BLOOD RIGHT HAND  Result Value Ref Range Status   Specimen Description BLOOD RIGHT HAND  Final   Special Requests   Final    BOTTLES DRAWN AEROBIC ONLY Blood Culture adequate volume   Culture   Final    NO GROWTH 4 DAYS Performed at [facility], [place] [address],  [place],  [address]    Report Status PENDING  Incomplete      Radiology Studies: DG CHEST PORT 1 VIEW  Result Date: 02/25/2020 CLINICAL DATA:  Respiratory failure EXAM: PORTABLE CHEST 1 VIEW COMPARISON:  02/23/2020 chest radiograph. FINDINGS: Tracheostomy tube tip overlies the tracheal air column at the level of the thoracic inlet. Right internal jugular central venous catheter terminates in the middle third of the SVC. Stable cardiomediastinal silhouette with top-normal heart size. No pneumothorax. Blunting of the left greater than right costophrenic angles, similar. Patchy reticular opacities at the right lung base, stable. Patchy consolidation and reticular opacities throughout the left lung, most prominent at the left lung base, not substantially changed. IMPRESSION: 1. Well-positioned support structures. No pneumothorax. 2. Stable blunting of the left greater than right costophrenic angles, differential includes pleural-parenchymal scarring or small pleural effusions. 3. Stable patchy left lung base consolidation with background of patchy reticular opacities in both lungs, cannot exclude pneumonia or aspiration superimposed on pulmonary fibrosis. Electronically Signed   By: Ilona Sorrel M.D.   On: 02/25/2020 05:07     Scheduled Meds: . apixaban  2.5 mg Oral BID  . chlorhexidine gluconate (MEDLINE KIT)  15 mL Mouth Rinse BID  . Chlorhexidine Gluconate Cloth  6 each Topical Q0600  . Chlorhexidine Gluconate Cloth  6 each Topical Q0600  . diltiazem  30 mg Per Tube Q8H  . docusate  100 mg Per Tube BID  . feeding supplement (PRO-STAT SUGAR FREE 64)  30 mL Per Tube BID  . insulin aspart  0-6 Units Subcutaneous Q4H  . insulin aspart  2 Units Subcutaneous Q4H  . mouth rinse  15 mL Mouth Rinse 10 times per day  . metroNIDAZOLE  500 mg Per Tube Q8H  . pantoprazole (PROTONIX) IV  40 mg Intravenous Q24H  . polyethylene glycol  17 g Per Tube Daily  . vancomycin variable dose per unstable renal  function (pharmacist dosing)   Does not apply See admin instructions   Continuous  Infusions: . sodium chloride    . cefTRIAXone (ROCEPHIN)  IV Stopped (02/25/20 1707)  . feeding supplement (NEPRO CARB STEADY) 45 mL/hr at 02/26/20 0000     LOS: 12 days   Time Spent in minutes   45 minutes  Maryann Mikhail D.O. on 02/26/2020 at 9:31 AM  Between 7am to 7pm - Please see pager noted on [email or web address]  After 7pm go to [email or web address]  And look for the night coverage person covering for me after hours  [facility] Office  [phone]

## 2020-02-27 ENCOUNTER — Inpatient Hospital Stay (HOSPITAL_COMMUNITY): Payer: Medicare HMO

## 2020-02-27 LAB — CULTURE, BLOOD (ROUTINE X 2)
Culture: NO GROWTH
Culture: NO GROWTH
Special Requests: ADEQUATE
Special Requests: ADEQUATE

## 2020-02-27 LAB — GLUCOSE, CAPILLARY
Glucose-Capillary: 124 mg/dL — ABNORMAL HIGH (ref 70–99)
Glucose-Capillary: 124 mg/dL — ABNORMAL HIGH (ref 70–99)
Glucose-Capillary: 151 mg/dL — ABNORMAL HIGH (ref 70–99)
Glucose-Capillary: 165 mg/dL — ABNORMAL HIGH (ref 70–99)
Glucose-Capillary: 176 mg/dL — ABNORMAL HIGH (ref 70–99)
Glucose-Capillary: 177 mg/dL — ABNORMAL HIGH (ref 70–99)

## 2020-02-27 LAB — CBC
HCT: 28.9 % — ABNORMAL LOW (ref 39.0–52.0)
HCT: 27.7 % — ABNORMAL LOW (ref 39.0–52.0)
Hemoglobin: 9.2 g/dL — ABNORMAL LOW (ref 13.0–17.0)
Hemoglobin: 9.1 g/dL — ABNORMAL LOW (ref 13.0–17.0)
MCH: 26 pg (ref 26.0–34.0)
MCH: 26.8 pg (ref 26.0–34.0)
MCHC: 31.8 g/dL (ref 30.0–36.0)
MCHC: 32.9 g/dL (ref 30.0–36.0)
MCV: 81.6 fL (ref 80.0–100.0)
MCV: 81.5 fL (ref 80.0–100.0)
Platelets: 303 10*3/uL (ref 150–400)
Platelets: 277 10*3/uL (ref 150–400)
RBC: 3.54 MIL/uL — ABNORMAL LOW (ref 4.22–5.81)
RBC: 3.4 MIL/uL — ABNORMAL LOW (ref 4.22–5.81)
RDW: 18.2 % — ABNORMAL HIGH (ref 11.5–15.5)
RDW: 18.3 % — ABNORMAL HIGH (ref 11.5–15.5)
WBC: 16.7 10*3/uL — ABNORMAL HIGH (ref 4.0–10.5)
WBC: 15.8 10*3/uL — ABNORMAL HIGH (ref 4.0–10.5)
nRBC: 0 % (ref 0.0–0.2)
nRBC: 0 % (ref 0.0–0.2)

## 2020-02-27 LAB — RENAL FUNCTION PANEL
Albumin: 2.3 g/dL — ABNORMAL LOW (ref 3.5–5.0)
Anion gap: 21 — ABNORMAL HIGH (ref 5–15)
BUN: 135 mg/dL — ABNORMAL HIGH (ref 8–23)
CO2: 22 mmol/L (ref 22–32)
Calcium: 10.2 mg/dL (ref 8.9–10.3)
Chloride: 98 mmol/L (ref 98–111)
Creatinine, Ser: 4.73 mg/dL — ABNORMAL HIGH (ref 0.61–1.24)
GFR calc Af Amer: 13 mL/min — ABNORMAL LOW (ref >60)
GFR calc non Af Amer: 11 mL/min — ABNORMAL LOW (ref >60)
Glucose, Bld: 131 mg/dL — ABNORMAL HIGH (ref 70–99)
Phosphorus: 5.8 mg/dL — ABNORMAL HIGH (ref 2.5–4.6)
Potassium: 3.9 mmol/L (ref 3.5–5.1)
Sodium: 141 mmol/L (ref 135–145)

## 2020-02-27 MED ORDER — LORAZEPAM 2 MG/ML IJ SOLN
INTRAMUSCULAR | Status: AC
  Filled 2020-02-27: qty 1

## 2020-02-27 MED ORDER — SODIUM CHLORIDE 0.9 % IV SOLN
100.0000 mL | INTRAVENOUS | Status: DC | PRN
Start: 2020-02-27 — End: 2020-02-27

## 2020-02-27 MED ORDER — VANCOMYCIN HCL IN DEXTROSE 500-5 MG/100ML-% IV SOLN
500.0000 mg | Freq: Once | INTRAVENOUS | Status: AC
  Administered 2020-02-27: 500 mg via INTRAVENOUS
  Filled 2020-02-27: qty 100

## 2020-02-27 MED ORDER — ALTEPLASE 2 MG IJ SOLR
INTRAMUSCULAR | Status: AC
  Filled 2020-02-27: qty 4

## 2020-02-27 MED ORDER — ALTEPLASE 2 MG IJ SOLR: 2.0000 mg | Freq: Once | INTRAMUSCULAR | Status: DC | PRN

## 2020-02-27 MED ORDER — HEPARIN SODIUM (PORCINE) 1000 UNIT/ML DIALYSIS: 1000.0000 [IU] | INTRAMUSCULAR | Status: DC | PRN

## 2020-02-27 MED ORDER — LORAZEPAM 2 MG/ML IJ SOLN
1.0000 mg | Freq: Once | INTRAMUSCULAR | Status: AC
  Administered 2020-02-27: 1 mg via INTRAVENOUS

## 2020-02-27 MED ORDER — SODIUM CHLORIDE 0.9 % IV SOLN
1.0000 g | INTRAVENOUS | Status: AC
  Administered 2020-02-27 – 2020-03-03 (×6): 1 g via INTRAVENOUS
  Filled 2020-02-27 (×6): qty 1

## 2020-02-27 MED ORDER — SODIUM CHLORIDE 0.9 % IV SOLN: 100.0000 mL | INTRAVENOUS | Status: DC | PRN

## 2020-02-27 MED ORDER — LIDOCAINE HCL (PF) 1 % IJ SOLN: 5.0000 mL | INTRAMUSCULAR | Status: DC | PRN

## 2020-02-27 MED ORDER — LIDOCAINE-PRILOCAINE 2.5-2.5 % EX CREA: 1.0000 "application " | TOPICAL_CREAM | CUTANEOUS | Status: DC | PRN

## 2020-02-27 MED ORDER — PENTAFLUOROPROP-TETRAFLUOROETH EX AERO: 1.0000 "application " | INHALATION_SPRAY | CUTANEOUS | Status: DC | PRN

## 2020-02-27 NOTE — Progress Notes (Signed)
Pharmacy Antibiotic Note  Shane Banks is a 74 y.o. 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 Patient is ESRD w/HD MWF PTA. Line pulled 5/26, line holiday and had it replaced 5/28. TTE negative. Repeat BCx are negative to date.  Patient was scheduled for HD on 5/29 but did not end up having a session, received a dose of vancomycin 500mg  that day anyway. After dialysis on 5/30, VR in the AM of 5/31 was 21. Patient currently receiving 4-hr HD session 6/1. Will redose with 500mg  today if he tolerates full session  Plan: Vancomycin 500mg  IV qHD Ceftriaxone 2g IV q24h + Flagyl 500mg  IV q8h per ID Monitor clinical progress, c/s, abx plan/LOT Pre-HD vancomycin level as indicated F/u HD schedule/tolerance inpatient to enter vancomycin maintenance doses   Height: 5\' 9"  (175.3 cm) Weight: 56.9 kg (125 lb 7.1 oz) IBW/kg (Calculated) : 70.7  Temp (24hrs), Avg:98.9 F (37.2 C), Min:98.4 F (36.9 C), Max:99.9 F (37.7 C)  Recent Labs  Lab 02/23/20 0258 02/23/20 0258 02/24/20 0742 02/25/20 0151 02/26/20 0216 02/27/20 0316 02/27/20 0705  WBC 16.2*   < > 20.9* 21.0* 17.9* 16.7* 15.8*  CREATININE 3.34*  --  4.62* 4.88* 3.65* 4.73*  --   VANCORANDOM  --   --   --   --  21  --   --    < > = values in this interval not displayed.    Estimated Creatinine Clearance: 11.4 mL/min (A) (by C-G formula based on SCr of 4.73 mg/dL (H)).    Allergies  Allergen Reactions  . Aspirin Rash  . Cephalexin Other (See Comments)    dizziness  . Penicillins     Itching,swelling     Arturo Morton, PharmD, BCPS Please check AMION for all [place] contact numbers Clinical Pharmacist 02/27/2020 8:31 AM

## 2020-02-27 NOTE — Progress Notes (Signed)
PROGRESS NOTE    Shane Banks  MRN:[id number] DOB: 05/19/1946 DOA: 02/14/2020 PCP: Hijazi, Ali, MD   Brief Narrative:  HPI On 02/14/2020 by [name], NP (PCCM) 74 year old male with past medical history as below, which is significant for Atrial fibrillation and stroke. He was admitted to [facility] for COVID 19 pneumonia, which unfortunately progressed to ARDS.  It seems as though his highest vent settings were 60% FiO2 and 8 of PEEP.  He had very little improvement in his ventilatory and oxygenation use over the course of his hospitalization and ultimately underwent tracheostomy and [facility] [place] [facility]. His course at [facility] was complicated by ongoing infections including UTI, MDRO pseudomonas pneumonia, and stenotrophomonas bacteremia. As a result of sepsis and shock he developed renal failure requiring dialysis, which he now receives M/W/F. In the morning hours oh 5/19 he developed hemoptysis. He had previously been taken off anticoagulation (for AF) due to anemia with positive hemoccult. Transferred to [facility] for evaluation of hemoptysis. Prior to the transfer he had been tolerating 28% ATC.   Interim history Admitted with acute on chronic hypoxemic respiratory failure also noted to have bleeding from tracheostomy site.  Patient now with sepsis secondary to bacteremia as well as osteomyelitis of the fifth sacral segment.  ID consulted and following.  Nephrology consulted and following for ESRD.  Pulmonology continues to follow for vent management. Assessment & Plan   Acute on chronic hypoxemic respiratory failure -Trach status post Covid infection earlier this year -Oxygenation had improved and patient was able to tolerate 28% ATC however now back on the vent for several days and has been intolerant of the weaning process -He has had multiple pneumonias of different organisms and has been treated with multiple antibiotics  including Fortaz, Merrem, cefepime, Toprol mycin, vancomycin, azithromycin, ceftriaxone, tigecycline, avycaz, merrem, flagyl.  -Most recently pneumonia has been MDRO Pseudomonas for which she was treated with Avycaz and mycin [facility]bulizers [facility] -He likely has ongoing aspiration -Now with hemoptysis and aspirated blood.  Per critical care note also seems to be originating from stoma.  Question ulceration.  Patient has cuffed trach placed by ENT.  No active bleeding on bronchoscopy.  Atrial fibrillation, paroxysmal -Wound discontinued due to bradycardia.  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 -Chest x-ray shows persistent bilateral lower lobe infiltrates with small bilateral pleural effusions -High suspicion of ongoing aspiration -Blood culture 02/20/2020 showed Enterococcus faecalis, Staphylococcus epidermidis -Patient was started on Levaquin, but this was discontinued -Now on vancomycin with HD -Infectious disease consulted and appreciated -Discussed biopsy with surgery, they recommended IR  -Interventional radiology consulted and appreciated-commended MRI -Echocardiogram EF 60-65%, grade 1 diastolic dysfunction -MRI pelvis/sacrum showed osteomyelitis of the fifth sacral segment.  Edema in the muscle around both hips and proximal thighs, left more than right, could represent myositis.  Left inguinal hernia contains loops of small bowel. -Recommending continuing vancomycin, ceftriaxone and Flagyl -Interventional radiology consulted and feels the area for biopsy is too small and fragile, therefore unsafe for biopsy and high risk for bone fracture  End-stage renal disease -Nephrology consulted and appreciated -Continue hemodialysis -Had a line  holiday, however did dialyze on 02/26/2020.    Chronic anemia -Likely secondary to ESRD,   critical illness -Status post blood transfusion -Hemoglobin has remained stable -Continue to monitor  Sacral pressure ulcer-osteomyelitis -Unstageable -Wound care consulted -Discussion above  Cachexia, severe protein malnutrition -Tube feeds via PEG  Mildly elevated LFTs -Continue to monitor  DVT Prophylaxis  SCDs- Eliquis  Code Status: DNR  Family Communication: None at bedside  Disposition Plan:  Status is: Inpatient  Remains inpatient appropriate because:IV treatments appropriate due to intensity of illness or inability to take PO.  Continues to be on IV antibiotics for osteomyelitis and bacteremia.  Pending further recommendations from infectious disease.   Dispo: [facility]              Anticipated d/c is to: TBD (difficult to place)              Anticipated d/c date is: > 3 days              Patient currently is not medically stable to d/c.   Consultants PCCM Nephrology ENT Infectious disease Interventional radiology  Procedures  ENT Flex scope FOB  Antibiotics   Anti-infectives (From admission, onward)   Start     Dose/Rate Route Frequency Ordered Stop   02/27/20 1200  vancomycin (VANCOCIN) IVPB 500 mg/100 ml premix     500 mg 100 mL/hr over 60 Minutes Intravenous Once 02/27/20 0829     02/24/20 1800  vancomycin (VANCOREADY) IVPB 500 mg/100 mL     500 mg 100 mL/hr over 60 Minutes Intravenous  Once 02/24/20 1308 02/24/20 1905   02/22/20 2200  metroNIDAZOLE (FLAGYL) tablet 500 mg     500 mg Per Tube Every 8 hours 02/22/20 0957     02/22/20 1700  cefTRIAXone (ROCEPHIN) 2 g in sodium chloride 0.9 % 100 mL IVPB     2 g 200 mL/hr over 30 Minutes Intravenous Every 24 hours 02/22/20 0957     02/22/20 1200  vancomycin (VANCOCIN) IVPB 500 mg/100 ml premix  Status:  Discontinued     500 mg 100 mL/hr over 60 Minutes Intravenous Every T-Th-Sa  (Hemodialysis) 02/20/20 1253 02/21/20 1040   02/21/20 1431  vancomycin variable dose per unstable renal function (pharmacist dosing)      Does not apply See admin instructions 02/21/20 1431     02/21/20 1200  vancomycin (VANCOREADY) IVPB 500 mg/100 mL     500 mg 100 mL/hr over 60 Minutes Intravenous  Once 02/21/20 0956 02/21/20 1253   02/21/20 0714  vancomycin (VANCOCIN) 500-5 MG/100ML-% IVPB  Status:  Discontinued    Note to Pharmacy: Javier, Danilo   : cabinet override      02/21/20 0714 02/21/20 1346   02/20/20 1400  vancomycin (VANCOREADY) IVPB 1250 mg/250 mL     1,250 mg 166.7 mL/hr over 90 Minutes Intravenous  Once 02/20/20 1253 02/20/20 1538   02/20/20 1200  levofloxacin (LEVAQUIN) IVPB 500 mg  Status:  Discontinued     500 mg 100 mL/hr over 60 Minutes Intravenous Every 48 hours 02/20/20 1020 02/21/20 0943   02/15/20 1545  vancomycin (VANCOCIN) IVPB 1000 mg/200 mL premix  Status:  Discontinued     1,000 mg 200 mL/hr over 60 Minutes Intravenous Every 24 hours 02/14/20 1530 02/14/20 1745   02/15/20 0330  ceFEPIme (MAXIPIME) 2 g in sodium chloride 0.9 % 100 mL IVPB  Status:  Discontinued     2 g 200 mL/hr over 30 Minutes Intravenous Every 12 hours 02/14/20 1531 02/14/20 1531   02/14/20 1531  ceFEPIme (MAXIPIME) 2 g   in sodium chloride 0.9 % 100 mL IVPB  Status:  Discontinued     2 g 200 mL/hr over 30 Minutes Intravenous Every 12 hours 02/14/20 1531 02/14/20 1745   02/14/20 1530  ceFEPIme (MAXIPIME) 2 g in sodium chloride 0.9 % 100 mL IVPB  Status:  Discontinued     2 g 200 mL/hr over 30 Minutes Intravenous  Once 02/14/20 1520 02/14/20 1531   02/14/20 1530  vancomycin (VANCOREADY) IVPB 1500 mg/300 mL  Status:  Discontinued     1,500 mg 150 mL/hr over 120 Minutes Intravenous  Once 02/14/20 1530 02/14/20 1745      Subjective:   Delphia Grates seen and examined today.  Patient with no complaints this day.  Denies current chest pain or shortness of breath.  Feels nothing is bothering  him.  Minimally conversant.  Objective:   Vitals:   02/27/20 0730 02/27/20 0745 02/27/20 0815 02/27/20 0830  BP: 103/60 (!) 114/58 112/68   Pulse: 76 66 72   Resp: (!) 25 19 (!) 27   Temp:      TempSrc:      SpO2: 100% 100% 100% 100%  Weight:      Height:        Intake/Output Summary (Last 24 hours) at 02/27/2020 0905 Last data filed at 02/27/2020 0800 Gross per 24 hour  Intake 1248.86 ml  Output 160 ml  Net 1088.86 ml   Filed Weights   02/25/20 1235 02/26/20 0447 02/27/20 0414  Weight: 58.6 kg 57.8 kg 56.9 kg   Exam  General: Well developed, chronically ill-appearing, NAD  HEENT: NCAT,  mucous membranes moist.   Neck: Trach  Cardiovascular: S1 S2 auscultated, irregular  Respiratory: Diminished breath sounds, few rhonchi, tachypneic  Abdomen: Soft, nontender, nondistended, + bowel sounds, peg  Extremities: warm dry without cyanosis clubbing or edema  Neuro: Awake, alert, does follow commands and answer some questions.  Very weak  Psych: Odd affect, however appropriate  Data Reviewed: I have personally reviewed following labs and imaging studies  CBC: Recent Labs  Lab 02/24/20 0742 02/25/20 0151 02/26/20 0216 02/27/20 0316 02/27/20 0705  WBC 20.9* 21.0* 17.9* 16.7* 15.8*  HGB 9.3* 9.8* 9.0* 9.2* 9.1*  HCT 29.3* 30.6* 28.4* 28.9* 27.7*  MCV 81.6 82.3 82.1 81.6 81.5  PLT 256 283 268 303 409   Basic Metabolic Panel: Recent Labs  Lab 02/20/20 1455 02/21/20 0540 02/23/20 0258 02/24/20 0742 02/25/20 0151 02/26/20 0216 02/27/20 0316  NA 139   < > 139 139 141 140 141  K 6.0*   < > 4.2 4.6 4.5 3.6 3.9  CL 98   < > 99 99 98 100 98  CO2 22   < > 22 19* 21* 23 22  GLUCOSE 208*   < > 142* 182* 154* 166* 131*  BUN 76*   < > 91* 128* 157* 95* 135*  CREATININE 3.20*   < > 3.34* 4.62* 4.88* 3.65* 4.73*  CALCIUM 9.8   < > 10.1 10.3 10.2 9.8 10.2  MG  --   --   --   --  2.5* 2.1  --   PHOS 5.0*  --   --   --   --   --  5.8*   < > = values in this interval not  displayed.   GFR: Estimated Creatinine Clearance: 11.4 mL/min (A) (by C-G formula based on SCr of 4.73 mg/dL (H)). Liver Function Tests: Recent Labs  Lab 02/20/20 1455 02/25/20 0151 02/27/20 0316  AST  --  62*  --   ALT  --  85*  --   ALKPHOS  --  219*  --   BILITOT  --  0.4  --   PROT  --  8.6*  --   ALBUMIN 2.6* 2.5* 2.3*   No results for input(s): LIPASE, AMYLASE in the last 168 hours. No results for input(s): AMMONIA in the last 168 hours. Coagulation Profile: No results for input(s): INR, PROTIME in the last 168 hours. Cardiac Enzymes: No results for input(s): CKTOTAL, CKMB, CKMBINDEX, TROPONINI in the last 168 hours. BNP (last 3 results) No results for input(s): PROBNP in the last 8760 hours. HbA1C: No results for input(s): HGBA1C in the last 72 hours. CBG: Recent Labs  Lab 02/26/20 1616 02/26/20 1914 02/26/20 2327 02/27/20 0329 02/27/20 0720  GLUCAP 155* 161* 154* 124* 165*   Lipid Profile: No results for input(s): CHOL, HDL, LDLCALC, TRIG, CHOLHDL, LDLDIRECT in the last 72 hours. Thyroid Function Tests: No results for input(s): TSH, T4TOTAL, FREET4, T3FREE, THYROIDAB in the last 72 hours. Anemia Panel: No results for input(s): VITAMINB12, FOLATE, FERRITIN, TIBC, IRON, RETICCTPCT in the last 72 hours. Urine analysis:    Component Value Date/Time   COLORURINE YELLOW 12/11/2019 1230   APPEARANCEUR HAZY (A) 12/11/2019 1230   LABSPEC 1.018 12/11/2019 1230   PHURINE 6.0 12/11/2019 1230   GLUCOSEU NEGATIVE 12/11/2019 1230   HGBUR NEGATIVE 12/11/2019 1230   BILIRUBINUR NEGATIVE 12/11/2019 1230   KETONESUR NEGATIVE 12/11/2019 1230   PROTEINUR 30 (A) 12/11/2019 1230   NITRITE NEGATIVE 12/11/2019 1230   LEUKOCYTESUR LARGE (A) 12/11/2019 1230   Sepsis Labs: _0 (procalcitonin:4,lacticidven:4)  ) Recent Results (from the past 240 hour(s))  Culture, respiratory (non-expectorated)     Status: None   Collection Time: 02/20/20 12:38 PM   Specimen:  Tracheal Aspirate; Respiratory  Result Value Ref Range Status   Specimen Description TRACHEAL ASPIRATE  Final   Special Requests NONE  Final   Gram Stain   Final    RARE WBC PRESENT, PREDOMINANTLY PMN RARE SQUAMOUS EPITHELIAL CELLS PRESENT FEW GRAM POSITIVE RODS RARE GRAM POSITIVE COCCI Performed at [facility], [place] [address], [place], [place] [address]    Culture   Final    ABUNDANT CORYNEBACTERIUM STRIATUM Standardized susceptibility testing for this organism is not available. FEW CANDIDA PARAPSILOSIS    Report Status 02/24/2020 FINAL  Final  Culture, blood (routine x 2)     Status: Abnormal   Collection Time: 02/20/20 12:50 PM   Specimen: BLOOD LEFT HAND  Result Value Ref Range Status   Specimen Description BLOOD LEFT HAND  Final   Special Requests   Final    BOTTLES DRAWN AEROBIC AND ANAEROBIC Blood Culture adequate volume   Culture  Setup Time   Final    GRAM POSITIVE COCCI IN CHAINS IN BOTH AEROBIC AND ANAEROBIC BOTTLES CRITICAL RESULT CALLED TO, READ BACK BY AND VERIFIED WITH: Jens Som AMEND [id number] FCP Performed at [facility], [place] [address] [place], [place] [address]    Culture (A)  Final    ENTEROCOCCUS FAECALIS STAPHYLOCOCCUS EPIDERMIDIS    Report Status 02/24/2020 FINAL  Final   Organism ID, Bacteria ENTEROCOCCUS FAECALIS  Final   Organism ID, Bacteria STAPHYLOCOCCUS EPIDERMIDIS  Final      Susceptibility   Enterococcus faecalis - MIC*    AMPICILLIN <=2 SENSITIVE Sensitive     VANCOMYCIN 1 SENSITIVE Sensitive     GENTAMICIN SYNERGY RESISTANT Resistant     *  ENTEROCOCCUS FAECALIS   Staphylococcus epidermidis - MIC*    CIPROFLOXACIN >=8 RESISTANT Resistant     ERYTHROMYCIN >=8 RESISTANT Resistant     GENTAMICIN >=16 RESISTANT Resistant     OXACILLIN >=4 RESISTANT Resistant     TETRACYCLINE 2 SENSITIVE Sensitive     VANCOMYCIN 2 SENSITIVE Sensitive     TRIMETH/SULFA 160 RESISTANT Resistant     CLINDAMYCIN >=8 RESISTANT Resistant      RIFAMPIN <=0.5 SENSITIVE Sensitive     Inducible Clindamycin NEGATIVE Sensitive     * STAPHYLOCOCCUS EPIDERMIDIS  Blood Culture ID Panel (Reflexed)     Status: Abnormal   Collection Time: 02/20/20 12:50 PM  Result Value Ref Range Status   Enterococcus species DETECTED (A) NOT DETECTED Final    Comment: CRITICAL RESULT CALLED TO, READ BACK BY AND VERIFIED WITH: PHARMD KAREN AMEND [id number] [id number] FCP    Vancomycin resistance NOT DETECTED NOT DETECTED Final   Listeria monocytogenes NOT DETECTED NOT DETECTED Final   Staphylococcus species DETECTED (A) NOT DETECTED Final    Comment: Methicillin (oxacillin) resistant coagulase negative staphylococcus. Possible blood culture contaminant (unless isolated from more than one blood culture draw or clinical case suggests pathogenicity). No antibiotic treatment is indicated for blood  culture contaminants. CRITICAL RESULT CALLED TO, READ BACK BY AND VERIFIED WITH: PHARMD KAREN AMEND [id number] FCP    Staphylococcus aureus (BCID) NOT DETECTED NOT DETECTED Final   Methicillin resistance DETECTED (A) NOT DETECTED Final    Comment: CRITICAL RESULT CALLED TO, READ BACK BY AND VERIFIED WITH: PHARMD KAREN AMEND [id number] [id number] FCP    Streptococcus species NOT DETECTED NOT DETECTED Final   Streptococcus agalactiae NOT DETECTED NOT DETECTED Final   Streptococcus pneumoniae NOT DETECTED NOT DETECTED Final   Streptococcus pyogenes NOT DETECTED NOT DETECTED Final   Acinetobacter baumannii NOT DETECTED NOT DETECTED Final   Enterobacteriaceae species NOT DETECTED NOT DETECTED Final   Enterobacter cloacae complex NOT DETECTED NOT DETECTED Final   Escherichia coli NOT DETECTED NOT DETECTED Final   Klebsiella oxytoca NOT DETECTED NOT DETECTED Final   Klebsiella pneumoniae NOT DETECTED NOT DETECTED Final   Proteus species NOT DETECTED NOT DETECTED Final   Serratia marcescens NOT DETECTED NOT DETECTED Final   Haemophilus influenzae NOT DETECTED NOT DETECTED Final    Neisseria meningitidis NOT DETECTED NOT DETECTED Final   Pseudomonas aeruginosa NOT DETECTED NOT DETECTED Final   Candida albicans NOT DETECTED NOT DETECTED Final   Candida glabrata NOT DETECTED NOT DETECTED Final   Candida krusei NOT DETECTED NOT DETECTED Final   Candida parapsilosis NOT DETECTED NOT DETECTED Final   Candida tropicalis NOT DETECTED NOT DETECTED Final    Comment: Performed at [facility], [address] [place], [place] [address]  Culture, blood (routine x 2)     Status: None   Collection Time: 02/20/20 12:58 PM   Specimen: BLOOD LEFT ARM  Result Value Ref Range Status   Specimen Description BLOOD LEFT ARM  Final   Special Requests   Final    BOTTLES DRAWN AEROBIC AND ANAEROBIC Blood Culture adequate volume   Culture   Final    NO GROWTH 5 DAYS Performed at [facility], [address] [place], [place] [address]    Report Status 02/25/2020 FINAL  Final  Culture, blood (Routine X 2) w Reflex to ID Panel     Status: None (Preliminary result)   Collection Time: 02/22/20 11:25 AM   Specimen: BLOOD RIGHT HAND  Result Value   Ref Range Status   Specimen Description BLOOD RIGHT HAND  Final   Special Requests   Final    BOTTLES DRAWN AEROBIC ONLY Blood Culture adequate volume   Culture   Final    NO GROWTH 4 DAYS Performed at [facility], [address] [place], [place] [address]    Report Status PENDING  Incomplete  Culture, blood (Routine X 2) w Reflex to ID Panel     Status: None (Preliminary result)   Collection Time: 02/22/20 11:25 AM   Specimen: BLOOD RIGHT HAND  Result Value Ref Range Status   Specimen Description BLOOD RIGHT HAND  Final   Special Requests   Final    BOTTLES DRAWN AEROBIC ONLY Blood Culture adequate volume   Culture   Final    NO GROWTH 4 DAYS Performed at [facility], [address] [place], [place] [address]    Report Status PENDING  Incomplete      Radiology Studies: No results found.   Scheduled  Meds: . apixaban  2.5 mg Per Tube BID  . chlorhexidine gluconate (MEDLINE KIT)  15 mL Mouth Rinse BID  . Chlorhexidine Gluconate Cloth  6 each Topical Q0600  . diltiazem  30 mg Per Tube Q8H  . docusate  100 mg Per Tube BID  . feeding supplement (PRO-STAT SUGAR FREE 64)  30 mL Per Tube BID  . insulin aspart  0-6 Units Subcutaneous Q4H  . insulin aspart  2 Units Subcutaneous Q4H  . mouth rinse  15 mL Mouth Rinse 10 times per day  . metroNIDAZOLE  500 mg Per Tube Q8H  . pantoprazole (PROTONIX) IV  40 mg Intravenous Q24H  . polyethylene glycol  17 g Per Tube Daily  . vancomycin  500 mg Intravenous Once  . vancomycin variable dose per unstable renal function (pharmacist dosing)   Does not apply See admin instructions   Continuous Infusions: . sodium chloride    . sodium chloride 10 mL/hr at 02/27/20 0600  . cefTRIAXone (ROCEPHIN)  IV Stopped (02/26/20 1809)  . feeding supplement (NEPRO CARB STEADY) 45 mL/hr at 02/26/20 1800     LOS: 13 days   Time Spent in minutes   45 minutes    D.O. on 02/27/2020 at 9:05 AM  Between 7am to 7pm - Please see pager noted on [email or web address]  After 7pm go to [email or web address]  And look for the night coverage person covering for me after hours  [facility] Office  [phone]  

## 2020-02-27 NOTE — Progress Notes (Signed)
NAME:  Shane Banks, MRN:  [id number], DOB:  1946-02-20, LOS: 51 ADMISSION DATE:  02/14/2020, CONSULTATION DATE:  5/19 REFERRING MD:  Dr. Alvino Chapel, CHIEF COMPLAINT:  Hemoptysis   Brief History   74 year old male. COVID ARDS in January. Unfortunately required trach and LTACH placement. Had been tolerating trach collar, but on 5/19 he developed hemoptysis and hypoxemic respiratory failure requiring vent.   History of present illness   74 year old male with past medical history as below, which is significant for Atrial fibrillation and stroke. He was admitted to [facility] for [place] 19 pneumonia, which unfortunately progressed to ARDS.  It seems as though his highest vent settings were 60% FiO2 and 8 of PEEP.  He had very little improvement in his ventilatory and oxygenation use over the course of his hospitalization and ultimately underwent tracheostomy and [facility] [place] [facility]. His course at [facility] was complicated by ongoing infections including UTI, MDRO pseudomonas pneumonia, and stenotrophomonas bacteremia. As a result of sepsis and shock he developed renal failure requiring dialysis, which he now receives M/W/F. In the morning hours oh 5/19 he developed hemoptysis. He had previously been taken off anticoagulation (for AF) due to anemia with positive hemoccult. Transferred to [facility] ED for evaluation of hemoptysis. Prior to the transfer he had been tolerating 28% ATC.   Past Medical History   has a past medical history of Acute on chronic respiratory failure with hypoxia ([place]), Atrial fibrillation with RVR ([place]), BPH (benign prostatic hyperplasia), COVID-19 virus infection, Pneumonia due to COVID-19 virus, Severe sepsis ([place]), and Stroke ([place]).   Significant Hospital Events   1/8  Admit [facility] for COVID PNA 2/23 Transfer to [facility] on vent 5/19 Transfer to [facility] for hemoptysis. #6 cuffed trach placed. Back on  vent. Bronch performed 5/20 Transitioned to trach collar.  5/22 back on vent full time 5/24 back on antibiotics for fevers 6/1 Initiating PSV weans  Consults:  ENT nephro ID  Procedures:  ENT flex scope 5/19 > no bleeding source identified FOB 5/19 > no pulmonary bleeding source identified.   Significant Diagnostic Tests:  CXR 5/28 >> R CVC placement confirmed Slightly improved aeration at the right base. 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 Edema in the muscles around the hips and in the posterior paraspinal musculature of the lower lumbar spine and in the buttocks which could represent myositis. Fluid-fluid level in the bladder may represent protein or debris in the bladder. The possibility of urinary tract infection should be considered.  Micro Data:  BAL 5/19 > no growth 5/19 blood > NG 5/22 trach aspirate>> candida parasipolis 5/22 blood>> 1/4 GPC> staph epi 5/25 blood>> GPC (enterococcus on biofire) 2/4 bottles>> E. Faecalis, staph epi. 5/25 trach aspirate>>many PMN, few GPR, rare GPC> diphtheroids 5/27 blood cx>> + for Enterococcus Faecalis/ Staphylococcus Epidermidis  Of note tracheal aspirate 5/7 + for Pseudomonas AERUGINOSA   Antimicrobials:  Levofloxacin 5/24>5/26 vanc 5/25> Ceftriaxone  5/27> Metronidazole 5/27>   Interim history/subjective:   No acute events, on vent, HD today Tolerating PSV weans  Objective   Blood pressure 107/79, pulse 92, temperature 98.6 F (37 C), temperature source Oral, resp. rate (!) 43, height _0  (1.753 m), weight 56.9 kg, SpO2 100 %.    Vent Mode: PRVC FiO2 (%):  [  30 %-40 %] 30 % Set Rate:  [16 bmp] 16 bmp Vt Set:  [480 mL] 480 mL PEEP:  [5 cmH20] 5 cmH20 Plateau Pressure:  [31 cmH20-35 cmH20] 31 cmH20   Intake/Output Summary (Last 24 hours) at  02/27/2020 1033 Last data filed at 02/27/2020 0900 Gross per 24 hour  Intake 1213.86 ml  Output 160 ml  Net 1053.86 ml   Filed Weights   02/25/20 1235 02/26/20 0447 02/27/20 0414  Weight: 58.6 kg 57.8 kg 56.9 kg    Examination:   Gen:      No acute distress, frail HEENT:  EOMI, sclera anicteric Neck:     No masses; no thyromegaly, trach Lungs:    Clear to auscultation bilaterally; normal respiratory effort CV:         Regular rate and rhythm; no murmurs Abd:      + bowel sounds; soft, non-tender; no palpable masses, no distension Ext:    No edema; adequate peripheral perfusion Skin:      Warm and dry; no rash Neuro: Awake, responds  Resolved Hospital Problem list     Assessment & Plan:   Acute on chronic hypoxemic respiratory failure requiring tracheostomy, prolonged MV: he has a trach s/p COVID pneumonia earlier this year. His oxygenation had  improved to tolerate 28% ATC, but has been back on the vent for several days. Course since complicated by multiple pneumonias, which is seems he has complete tx for (MDRO pseudomonas treated with inhaled tobramycin). Now he has had hemoptysis and aspirated blood. Decompensated in ER in this setting. Hemoptysis seems to be originating from stoma. Ulceration?  Has a cuffed trach placed by ENT.  No active bleeding from the blood on bronchoscopy New LUL pneumonia- diphtheroids on cx.  Plan -Con't full vent support. PSV weans as tolerated -Con't vanc, flagyl,ceftaz -VAP prevention protocol. -need to continue cuffed trach until back on TC - trend CXR - Culture as is clinically indiacted   Best practice:  Per primary team  Marshell Garfinkel MD [facility] Please see [email or web address] for pager details.  02/27/2020, 10:35 AM

## 2020-02-27 NOTE — Progress Notes (Signed)
[facility] Progress Note  Subjective: seen in ICU  Vitals:   02/27/20 1045 02/27/20 1100 02/27/20 1115 02/27/20 1130  BP: 111/64 131/81 (!) 143/71 109/68  Pulse: 92 78 79 90  Resp: (!) 35 (!) 43 (!) 33 (!) 40  Temp:  98.6 F (37 C)  98.6 F (37 C)  TempSrc:  Oral  Oral  SpO2: 95% 100% 100% 100%  Weight:      Height:        Exam:   In ICU on vent, not responding  no jvd  Chest cta bilat ant/ lat  Cor reg no RG  Abd soft ntnd no ascites, +llq ostomy, + peg tube   Ext no LE edema, feet in boots   Not following commands, not responding, eyes half open     OP HD: [facility] [place] hospitalization, then had severe infections on [facility] and developed AKI felt due to IV gent toxicity requiring initiation of HD 12/16/19 at [facility]. Admitted to ICU for trach bleed on 5/19. Was getting MWF HD at [facility] prior to admit.    Assessment/ Plan: 1. Enterococcus/ MRSE bacteremia - TDC removed 5/26, per ID needs 6 wks vanc/ fortaz/ flagyl for polymicrobial sepsis and osteomyelitis.  2. Sacral osteo/ decub stage IV - per ID, as above 3. ESRD/ AKI - on HD since mid March 2021, d/t gent toxicity. HD MWF, for HD today bumped from yesterday. Temp HD cath acting up, have d/w ID will go ahead and request [facility] replacement w/ IR. Will do HD TTS here this week. 4. A/C resp failure - back on vent now, per CCM 5. Trach bleed - resolved, was reason for admit from [facility] to [facility] 6. Tracheobronchitis - sp course of doxycycline 7. AMS - multifactorial 8. Atrial fib - per primary 9. Anemia ckd - transfuse prn, Ca 11.0 corrected, phos 3.5- 6 here, use low Ca++ bath , check PTH 10. 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 SP COVID pna - original problem in Feb at [facility] 02/27/2020, 11:54 AM   Recent Labs  Lab 02/20/20 1455 02/21/20 0252 02/26/20 0216 02/26/20 0216 02/27/20 0316  02/27/20 0705  K 6.0*   < > 3.6  --  3.9  --   BUN 76*   < > 95*  --  135*  --   CREATININE 3.20*   < > 3.65*  --  4.73*  --   CALCIUM 9.8   < > 9.8  --  10.2  --   PHOS 5.0*  --   --   --  5.8*  --   HGB 9.8*   < > 9.0*   < > 9.2* 9.1*   < > = values in this interval not displayed.   Inpatient medications: . apixaban  2.5 mg Per Tube BID  . chlorhexidine gluconate (MEDLINE KIT)  15 mL Mouth Rinse BID  . Chlorhexidine Gluconate Cloth  6 each Topical Q0600  . diltiazem  30 mg Per Tube Q8H  . docusate  100 mg Per Tube BID  . feeding supplement (PRO-STAT SUGAR FREE 64)  30 mL Per Tube BID  . insulin aspart  0-6 Units Subcutaneous Q4H  . insulin aspart  2 Units Subcutaneous Q4H  . mouth rinse  15 mL Mouth Rinse 10 times per day  . metroNIDAZOLE  500 mg Per Tube  Q8H  . pantoprazole (PROTONIX) IV  40 mg Intravenous Q24H  . polyethylene glycol  17 g Per Tube Daily  . vancomycin  500 mg Intravenous Once  . vancomycin variable dose per unstable renal function (pharmacist dosing)   Does not apply See admin instructions   . sodium chloride    . sodium chloride 10 mL/hr at 02/27/20 0600  . cefTAZidime (FORTAZ)  IV    . feeding supplement (NEPRO CARB STEADY) 45 mL/hr at 02/26/20 1800   sodium chloride, sodium chloride, acetaminophen (TYLENOL) oral liquid 160 mg/5 mL, fentaNYL (SUBLIMAZE) injection, fentaNYL (SUBLIMAZE) injection, heparin, heparin, lidocaine (PF), lidocaine-prilocaine, pentafluoroprop-tetrafluoroeth

## 2020-02-27 NOTE — Progress Notes (Signed)
[facility] for Infectious Disease  Date of Admission:  02/14/2020     Total days of antibiotics 8         ASSESSMENT:  Shane Banks blood culture remain from 5/27 remain without growth to date. TTE without evidence of endocarditis and TEE would not change the current plan of care. IR evaluation with no fluid collection and increased risk for biopsy. He will need 6 weeks of antimicrobial therapy for polymicrobial bacteremia and osteomyelitis. Change ceftriaxone for gram negative coverage to ceftazidime for ease of use with dialysis. Continue vancomycin and metronidazole. Any residual pneumonia will be covered with broad spectrum of current medications. End date of therapy planned for 04/04/20. Continue to optimize nutrition and off loading of site per primary team. Respiratory management per CCM.   PLAN:  1. Change ceftriaxone to ceftazidime for use with dialysis 2. Continue vancomycin and metronidazole. 3. Off load site and optimize nutrition as able to encourage healing.   Principal Problem:   Enterococcal bacteremia Active Problems:   Acute respiratory failure (HCC)   Hemoptysis   Pressure injury of skin   Protein-calorie malnutrition, severe   Decubitus ulcer of sacral region, stage 4 (HCC)   Fever   . apixaban  2.5 mg Per Tube BID  . chlorhexidine gluconate (MEDLINE KIT)  15 mL Mouth Rinse BID  . Chlorhexidine Gluconate Cloth  6 each Topical Q0600  . diltiazem  30 mg Per Tube Q8H  . docusate  100 mg Per Tube BID  . feeding supplement (PRO-STAT SUGAR FREE 64)  30 mL Per Tube BID  . insulin aspart  0-6 Units Subcutaneous Q4H  . insulin aspart  2 Units Subcutaneous Q4H  . mouth rinse  15 mL Mouth Rinse 10 times per day  . metroNIDAZOLE  500 mg Per Tube Q8H  . pantoprazole (PROTONIX) IV  40 mg Intravenous Q24H  . polyethylene glycol  17 g Per Tube Daily  . vancomycin  500 mg Intravenous Once  . vancomycin variable dose per unstable renal function (pharmacist dosing)    Does not apply See admin instructions    SUBJECTIVE:  Afebrile overnight with no acute events. Denies pain or fevers.   Allergies  Allergen Reactions  . Aspirin Rash  . Cephalexin Other (See Comments)    dizziness  . Penicillins     Itching,swelling      Review of Systems: Review of Systems  Constitutional: Negative for chills, fever and weight loss.  Respiratory: Negative for cough, shortness of breath and wheezing.   Cardiovascular: Negative for chest pain and leg swelling.  Gastrointestinal: Negative for abdominal pain, constipation, diarrhea, nausea and vomiting.  Skin: Negative for rash.      OBJECTIVE: Vitals:   02/27/20 0830 02/27/20 0845 02/27/20 0901 02/27/20 0915  BP:  123/76 (!) 162/80 106/70  Pulse:  (!) 102 (!) 105 79  Resp:  (!) 37 (!) 33 (!) 39  Temp:      TempSrc:      SpO2: 100% 100% 100% 100%  Weight:      Height:       Body mass index is 18.52 kg/m.  Physical Exam Constitutional:      General: He is not in acute distress.    Appearance: He is well-developed.     Comments: Lying in bed with head of bed elevated; pleasant.   Cardiovascular:     Rate and Rhythm: Normal rate and regular rhythm.     Heart sounds: Normal heart sounds.  Comments: Right IJ HD catheter currently in use; site clean and dry; no signs of infection.  Pulmonary:     Effort: Pulmonary effort is normal. Tachypnea present.     Breath sounds: Normal breath sounds.     Comments: On pressure support.  Skin:    General: Skin is warm and dry.     Comments: Prevalon boots in place.   Neurological:     Mental Status: He is alert and oriented to person, place, and time.     Lab Results Lab Results  Component Value Date   WBC 15.8 (H) 02/27/2020   HGB 9.1 (L) 02/27/2020   HCT 27.7 (L) 02/27/2020   MCV 81.5 02/27/2020   PLT 277 02/27/2020    Lab Results  Component Value Date   CREATININE 4.73 (H) 02/27/2020   BUN 135 (H) 02/27/2020   NA 141 02/27/2020   K 3.9  02/27/2020   CL 98 02/27/2020   CO2 22 02/27/2020    Lab Results  Component Value Date   ALT 85 (H) 02/25/2020   AST 62 (H) 02/25/2020   ALKPHOS 219 (H) 02/25/2020   BILITOT 0.4 02/25/2020     Microbiology: Recent Results (from the past 240 hour(s))  Culture, respiratory (non-expectorated)     Status: None   Collection Time: 02/20/20 12:38 PM   Specimen: Tracheal Aspirate; Respiratory  Result Value Ref Range Status   Specimen Description TRACHEAL ASPIRATE  Final   Special Requests NONE  Final   Gram Stain   Final    RARE WBC PRESENT, PREDOMINANTLY PMN RARE SQUAMOUS EPITHELIAL CELLS PRESENT FEW GRAM POSITIVE RODS RARE GRAM POSITIVE COCCI Performed at [facility], [place] [address] [place], [place] [address]    Culture   Final    ABUNDANT CORYNEBACTERIUM STRIATUM Standardized susceptibility testing for this organism is not available. FEW CANDIDA PARAPSILOSIS    Report Status 02/24/2020 FINAL  Final  Culture, blood (routine x 2)     Status: Abnormal   Collection Time: 02/20/20 12:50 PM   Specimen: BLOOD LEFT HAND  Result Value Ref Range Status   Specimen Description BLOOD LEFT HAND  Final   Special Requests   Final    BOTTLES DRAWN AEROBIC AND ANAEROBIC Blood Culture adequate volume   Culture  Setup Time   Final    GRAM POSITIVE COCCI IN CHAINS IN BOTH AEROBIC AND ANAEROBIC BOTTLES CRITICAL RESULT CALLED TO, READ BACK BY AND VERIFIED WITH: Jens Som AMEND [id number] FCP Performed at [facility], [place] [address], [place], [place] [address]    Culture (A)  Final    ENTEROCOCCUS FAECALIS STAPHYLOCOCCUS EPIDERMIDIS    Report Status 02/24/2020 FINAL  Final   Organism ID, Bacteria ENTEROCOCCUS FAECALIS  Final   Organism ID, Bacteria STAPHYLOCOCCUS EPIDERMIDIS  Final      Susceptibility   Enterococcus faecalis - MIC*    AMPICILLIN <=2 SENSITIVE Sensitive     VANCOMYCIN 1 SENSITIVE Sensitive     GENTAMICIN SYNERGY RESISTANT Resistant     * ENTEROCOCCUS  FAECALIS   Staphylococcus epidermidis - MIC*    CIPROFLOXACIN >=8 RESISTANT Resistant     ERYTHROMYCIN >=8 RESISTANT Resistant     GENTAMICIN >=16 RESISTANT Resistant     OXACILLIN >=4 RESISTANT Resistant     TETRACYCLINE 2 SENSITIVE Sensitive     VANCOMYCIN 2 SENSITIVE Sensitive     TRIMETH/SULFA 160 RESISTANT Resistant     CLINDAMYCIN >=8 RESISTANT Resistant     RIFAMPIN <=0.5  SENSITIVE Sensitive     Inducible Clindamycin NEGATIVE Sensitive     * STAPHYLOCOCCUS EPIDERMIDIS  Blood Culture ID Panel (Reflexed)     Status: Abnormal   Collection Time: 02/20/20 12:50 PM  Result Value Ref Range Status   Enterococcus species DETECTED (A) NOT DETECTED Final    Comment: CRITICAL RESULT CALLED TO, READ BACK BY AND VERIFIED WITH: PHARMD KAREN AMEND [id number] FCP    Vancomycin resistance NOT DETECTED NOT DETECTED Final   Listeria monocytogenes NOT DETECTED NOT DETECTED Final   Staphylococcus species DETECTED (A) NOT DETECTED Final    Comment: Methicillin (oxacillin) resistant coagulase negative staphylococcus. Possible blood culture contaminant (unless isolated from more than one blood culture draw or clinical case suggests pathogenicity). No antibiotic treatment is indicated for blood  culture contaminants. CRITICAL RESULT CALLED TO, READ BACK BY AND VERIFIED WITH: PHARMD KAREN AMEND [id number] FCP    Staphylococcus aureus (BCID) NOT DETECTED NOT DETECTED Final   Methicillin resistance DETECTED (A) NOT DETECTED Final    Comment: CRITICAL RESULT CALLED TO, READ BACK BY AND VERIFIED WITH: PHARMD KAREN AMEND [id number] FCP    Streptococcus species NOT DETECTED NOT DETECTED Final   Streptococcus agalactiae NOT DETECTED NOT DETECTED Final   Streptococcus pneumoniae NOT DETECTED NOT DETECTED Final   Streptococcus pyogenes NOT DETECTED NOT DETECTED Final   Acinetobacter baumannii NOT DETECTED NOT DETECTED Final   Enterobacteriaceae species NOT DETECTED NOT DETECTED Final   Enterobacter  cloacae complex NOT DETECTED NOT DETECTED Final   Escherichia coli NOT DETECTED NOT DETECTED Final   Klebsiella oxytoca NOT DETECTED NOT DETECTED Final   Klebsiella pneumoniae NOT DETECTED NOT DETECTED Final   Proteus species NOT DETECTED NOT DETECTED Final   Serratia marcescens NOT DETECTED NOT DETECTED Final   Haemophilus influenzae NOT DETECTED NOT DETECTED Final   Neisseria meningitidis NOT DETECTED NOT DETECTED Final   Pseudomonas aeruginosa NOT DETECTED NOT DETECTED Final   Candida albicans NOT DETECTED NOT DETECTED Final   Candida glabrata NOT DETECTED NOT DETECTED Final   Candida krusei NOT DETECTED NOT DETECTED Final   Candida parapsilosis NOT DETECTED NOT DETECTED Final   Candida tropicalis NOT DETECTED NOT DETECTED Final    Comment: Performed at [facility], [place]. [address] [place], [place] [address]  Culture, blood (routine x 2)     Status: None   Collection Time: 02/20/20 12:58 PM   Specimen: BLOOD LEFT ARM  Result Value Ref Range Status   Specimen Description BLOOD LEFT ARM  Final   Special Requests   Final    BOTTLES DRAWN AEROBIC AND ANAEROBIC Blood Culture adequate volume   Culture   Final    NO GROWTH 5 DAYS Performed at [facility], [place] [address] [place], [place] [address]    Report Status 02/25/2020 FINAL  Final  Culture, blood (Routine X 2) w Reflex to ID Panel     Status: None (Preliminary result)   Collection Time: 02/22/20 11:25 AM   Specimen: BLOOD RIGHT HAND  Result Value Ref Range Status   Specimen Description BLOOD RIGHT HAND  Final   Special Requests   Final    BOTTLES DRAWN AEROBIC ONLY Blood Culture adequate volume   Culture   Final    NO GROWTH 4 DAYS Performed at [facility], [place] [address], [place], [place] [address]    Report Status PENDING  Incomplete  Culture, blood (Routine X 2) w Reflex to ID Panel  Status: None (Preliminary result)   Collection Time: 02/22/20 11:25 AM   Specimen: BLOOD RIGHT HAND  Result Value  Ref Range Status   Specimen Description BLOOD RIGHT HAND  Final   Special Requests   Final    BOTTLES DRAWN AEROBIC ONLY Blood Culture adequate volume   Culture   Final    NO GROWTH 4 DAYS Performed at [facility], [address] [place], [place] [address]    Report Status PENDING  Incomplete     Terri Piedra, [place] [facility]  02/27/2020  9:22 AM

## 2020-02-27 NOTE — Consult Note (Signed)
Chief Complaint: Patient was seen in consultation today for tunneled hemodialysis catheter placement Chief Complaint  Patient presents with  . trach bleeding   at the request of Dr Jonnie Finner   Supervising Physician: Daryll Brod  Patient Status: [facility] - In-pt  History of Present Illness: Shane Banks is a 74 y.o. male   Admitted with Covid ARDS from [facility] Jan 2021 [facility] [place] and [place] for few weeks Was transferred to ICU with hemoptysis 02/14/20  Developed renal failure/injury-- requiring dialysis  (M/W/F) Developed Bacteremia and tunneled cath was removed Temp cath placed 5/28 with Dr Tamala Julian at bedside Line holiday since then  DR Jonnie Finner has requested new tunneled cath placement afeb since 5/31 Wbc trending down-- 15.8 this am  Scheduled for tunneled dialysis catheter placement in IR 02/28/20    Past Medical History:  Diagnosis Date  . Acute on chronic respiratory failure with hypoxia ([place])   . Atrial fibrillation with RVR ([place])   . BPH (benign prostatic hyperplasia)   . COVID-19 virus infection   . Pneumonia due to COVID-19 virus   . Severe sepsis ([place])   . Stroke [facility])     Past Surgical History:  Procedure Laterality Date  . IR FLUORO GUIDE CV LINE RIGHT  12/15/2019  . IR THORACENTESIS ASP PLEURAL SPACE W/IMG GUIDE  12/21/2019  . IR US GUIDE VASC ACCESS RIGHT  12/15/2019    Allergies: Aspirin, Cephalexin, and Penicillins  Medications: Prior to Admission medications   Medication Sig Start Date End Date Taking? Authorizing Provider  amLODipine (NORVASC) 10 MG tablet Take 10 mg by mouth daily.   Yes [provider]  ascorbic acid (VITAMIN C) 500 MG tablet Take 500 mg by mouth 2 (two) times daily.   Yes [provider]  cholecalciferol (VITAMIN D3) 25 MCG (1000 UNIT) tablet Take 1,000 Units by mouth daily.   Yes [provider]  guaifenesin (HUMIBID E) 400 MG TABS tablet Take 400 mg by mouth in the morning, at noon,  and at bedtime.   Yes [provider]  levothyroxine (SYNTHROID) 25 MCG tablet Take 25 mcg by mouth daily before breakfast.   Yes [provider]  melatonin 3 MG TABS tablet Take 3 mg by mouth at bedtime.   Yes [provider]  nutrition supplement, JUVEN, (JUVEN) PACK Take 1 packet by mouth in the morning, at noon, and at bedtime.   Yes [provider]  Omega-3 Fatty Acids (FISH OIL) 1000 MG CAPS Take 1 capsule by mouth daily.   Yes [provider]  pantoprazole (PROTONIX) 40 MG tablet Take 40 mg by mouth 2 (two) times daily before a meal.   Yes [provider]  sertraline (ZOLOFT) 25 MG tablet Take 25 mg by mouth daily.   Yes [provider]  Specialty Vitamins Products (PROSTATE PO) Take 30 mLs by mouth daily.   Yes [provider]  tamsulosin (FLOMAX) 0.4 MG CAPS capsule Take 0.4 mg by mouth daily.   Yes [provider]     No family history on file.  Social History   Socioeconomic History  . Marital status: Unknown    Spouse name: Not on file  . Number of children: Not on file  . Years of education: Not on file  . Highest education level: Not on file  Occupational History  . Not on file  Tobacco Use  . Smoking status: Not on file  Substance and Sexual Activity  . Alcohol use: Not on  file  . Drug use: Not on file  . Sexual activity: Not on file  Other Topics Concern  . Not on file  Social History Narrative  . Not on file   Social Determinants of Health   Financial Resource Strain:   . Difficulty of Paying Living Expenses:   Food Insecurity:   . Worried About Charity fundraiser in the Last Year:   . Arboriculturist in the Last Year:   Transportation Needs:   . Film/video editor (Medical):   Marland Kitchen Lack of Transportation (Non-Medical):   Physical Activity:   . Days of Exercise per Week:   . Minutes of Exercise per Session:   Stress:   . Feeling of Stress :   Social Connections:   .  Frequency of Communication with Friends and Family:   . Frequency of Social Gatherings with Friends and Family:   . Attends Religious Services:   . Active Member of Clubs or Organizations:   . Attends Archivist Meetings:   Marland Kitchen Marital Status:      Review of Systems: A 12 point ROS discussed and pertinent positives are indicated in the HPI above.  All other systems are negative.   Vital Signs: BP (!) 95/59   Pulse 77   Temp 97.6 F (36.4 C) (Oral)   Resp (!) 26   Ht 5\' 9"  (1.753 m)   Wt 125 lb 7.1 oz (56.9 kg)   SpO2 90%   BMI 18.52 kg/m   Physical Exam Vitals reviewed.  Constitutional:      Appearance: He is ill-appearing.  Cardiovascular:     Rate and Rhythm: Rhythm irregular.  Skin:    General: Skin is warm and dry.  Neurological:     Comments: No response  Psychiatric:     Comments: Consented with sister [name] via phone     Imaging: DG Chest 1 View  Result Date: 02/23/2020 CLINICAL DATA:  PICC line EXAM: CHEST  1 VIEW COMPARISON:  02/19/2020, CT chest 12/21/2019 FINDINGS: Tracheostomy tube remains in place. Right-sided central venous catheter with tip projecting over expected location of SVC allowing for mediastinal shift to the left. No pneumothorax. No peripheral catheter is visualized. Persistent consolidation probable pleural effusion on the left. Improved aeration at the right base. Shift of mediastinal contents to the left consistent with volume loss. Aortic atherosclerosis. IMPRESSION: 1. Insertion of right-sided central venous catheter, the tip projects to the left of the spine however the patient is very rotated and there is shift of mediastinal contents to the left and the tip projects over expected location of SVC allowing for rotation and mediastinal shift. There is no pneumothorax. 2. No extremity catheter is visualized 3. Slightly improved aeration at the right base. Left basilar consolidation and probable effusion, in addition to the  remaining pulmonary infiltrates are otherwise unchanged. Electronically Signed   By: Donavan Foil M.D.   On: 02/23/2020 19:07   MR PELVIS WO CONTRAST  Result Date: 02/23/2020 CLINICAL DATA:  Sepsis.  Sacral decubitus ulcer. EXAM: MRI PELVIS WITHOUT CONTRAST TECHNIQUE: Multiplanar multisequence MR imaging of the pelvis was performed. No intravenous contrast was administered. COMPARISON:  CT scan of the chest abdomen and pelvis dated 12/21/2019 FINDINGS: Musculoskeletal: There is abnormal increased T2 signal from the fifth sacral segment. There is fragmentation of the proximal segments of the coccyx. There is patchy decreased signal intensity from the marrow of the bones of the pelvis, probably representing red marrow reactivation. No  fractures. There is patchy edema in the muscles around both hips and in both buttocks with subcutaneous edema in the lateral aspects of both hips and in the proximal thighs, left more than right. Urinary Tract: There is a fluid-fluid level in the bladder, possibly representing debris or protein in the bladder. Enlargement of the median lobe of the prostate gland. Bowel: Ostomy in the left mid abdomen. Left inguinal hernia contains a loop of small bowel. Vascular/Lymphatic: No pathologically enlarged lymph nodes. No significant vascular abnormality seen. Reproductive:  Enlarged median lobe of the prostate gland. Other:  None. IMPRESSION: 1. Osteomyelitis of the fifth sacral segment. 2. Edema in the muscles around both hips and in the proximal thighs, left more than right. This could represent myositis. 3. Left inguinal hernia contains a loop of small bowel. 4. Enlarged median lobe of the prostate gland. Electronically Signed   By: Lorriane Shire M.D.   On: 02/23/2020 17:35   MR SACRUM SI JOINTS WO CONTRAST  Result Date: 02/23/2020 CLINICAL DATA:  Sepsis.  Sacral decubitus ulcer. EXAM: MRI SACRUM WITHOUT CONTRAST TECHNIQUE: Multiplanar, multisequence MR imaging of the sacrum was  performed. No intravenous contrast was administered. COMPARISON:  CT scan of the abdomen and pelvis dated 12/21/2019 FINDINGS: There is abnormal edema in the fifth sacral segment. The proximal segments of the coccyx are in either destroyed or have been previously resected due to the overlying sacral decubitus ulcer. There is diffuse patchy low signal of the marrow of the pelvic bones and lumbar spine consistent with red marrow reactivation. The sacroiliac joints and hip joints are normal. There is edema in the muscles around the hips and in the posterior paraspinal musculature of the lower lumbar spine and in the buttocks which could represent myositis. Fluid-fluid level in the bladder may represent protein or debris in the bladder. Does the patient have findings of a urinary tract infection? Enlarged median lobe of the prostate gland. IMPRESSION: 1. Osteomyelitis of the fifth sacral segment. 2. Edema in the muscles around the hips and in the posterior paraspinal musculature of the lower lumbar spine and in the buttocks which could represent myositis. 3. Fluid-fluid level in the bladder may represent protein or debris in the bladder. The possibility of urinary tract infection should be considered. Electronically Signed   By: Lorriane Shire M.D.   On: 02/23/2020 17:40   DG CHEST PORT 1 VIEW  Result Date: 02/27/2020 CLINICAL DATA:  Tachypnea, hypotension, increased trach secretions EXAM: PORTABLE CHEST 1 VIEW COMPARISON:  02/25/2020 FINDINGS: No significant interval change in AP portable radiograph with heterogeneous airspace opacity and pleural effusions, left greater than right. No new airspace opacity. Tracheostomy remains in position. Right neck multi lumen vascular catheter, tip over the lower SVC. IMPRESSION: No significant interval change in AP portable radiograph with heterogeneous airspace opacity and pleural effusions, left greater than right. No new airspace opacity. Electronically Signed   By: Eddie Candle M.D.   On: 02/27/2020 12:43   DG CHEST PORT 1 VIEW  Result Date: 02/25/2020 CLINICAL DATA:  Respiratory failure EXAM: PORTABLE CHEST 1 VIEW COMPARISON:  02/23/2020 chest radiograph. FINDINGS: Tracheostomy tube tip overlies the tracheal air column at the level of the thoracic inlet. Right internal jugular central venous catheter terminates in the middle third of the SVC. Stable cardiomediastinal silhouette with top-normal heart size. No pneumothorax. Blunting of the left greater than right costophrenic angles, similar. Patchy reticular opacities at the right lung base, stable. Patchy consolidation and reticular opacities throughout the  left lung, most prominent at the left lung base, not substantially changed. IMPRESSION: 1. Well-positioned support structures. No pneumothorax. 2. Stable blunting of the left greater than right costophrenic angles, differential includes pleural-parenchymal scarring or small pleural effusions. 3. Stable patchy left lung base consolidation with background of patchy reticular opacities in both lungs, cannot exclude pneumonia or aspiration superimposed on pulmonary fibrosis. Electronically Signed   By: Ilona Sorrel M.D.   On: 02/25/2020 05:07   DG CHEST PORT 1 VIEW  Result Date: 02/19/2020 CLINICAL DATA:  Hypoxia and tachycardia. Ventilator patient. EXAM: PORTABLE CHEST 1 VIEW COMPARISON:  Radiographs 02/16/2020 and 02/15/2020. Radiographs 12/21/2019. FINDINGS: 1543 hours. Tracheostomy and right IJ hemodialysis catheters appear unchanged. The heart size and mediastinal contours are stable with aortic atherosclerosis. Interval improvement in the previously demonstrated left lower lobe consolidation. Patchy airspace opacities in both lungs are otherwise stable. No pneumothorax or significant pleural effusion. The bones appear unchanged. IMPRESSION: 1. Interval improvement in left lower lobe infiltrate. The patchy bilateral airspace opacities are otherwise unchanged, likely  related to the patient's viral pneumonia. 2. Stable support system. 3. No acute findings identified. Electronically Signed   By: Richardean Sale M.D.   On: 02/19/2020 16:04   DG Chest Port 1 View  Result Date: 02/16/2020 CLINICAL DATA:  Acute respiratory failure. EXAM: PORTABLE CHEST 1 VIEW COMPARISON:  02/15/2020 FINDINGS: The tracheostomy tube is stable. The right IJ dialysis catheter is stable. The cardiac silhouette, mediastinal and hilar contours are unchanged. Stable tortuosity and calcification of the thoracic aorta. Persistent left lower lobe airspace consolidation but slight improved surrounding aeration. Persistent right basilar infiltrate and small bilateral pleural effusions. IMPRESSION: 1. Stable support apparatus. 2. Persistent bilateral lower lobe infiltrates and small bilateral pleural effusions. Electronically Signed   By: Marijo Sanes M.D.   On: 02/16/2020 06:55   DG Chest Port 1 View  Result Date: 02/15/2020 CLINICAL DATA:  Hemoptysis EXAM: PORTABLE CHEST 1 VIEW COMPARISON:  Yesterday FINDINGS: Patchy bilateral pulmonary infiltrate asymmetric to the left, likely pneumonia. Right-sided central line with tip at the upper cavoatrial junction. Tracheostomy tube in place. Normal heart size when accounting for technique. No visible pneumothorax or increasing pleural fluid IMPRESSION: Stable hardware positioning and bilateral infiltrates. Electronically Signed   By: Monte Fantasia M.D.   On: 02/15/2020 06:34   DG Chest Port 1 View  Result Date: 02/14/2020 CLINICAL DATA:  Hemoptysis. EXAM: PORTABLE CHEST 1 VIEW COMPARISON:  Feb 02, 2020. FINDINGS: Stable cardiomegaly with central pulmonary vascular congestion. Tracheostomy tube is unchanged in position. Right internal jugular dialysis catheter is noted with tip in expected position of right atrium. No pneumothorax is noted. Stable bilateral lung opacities are noted concerning for either edema or multifocal pneumonia, with probable small  bilateral pleural effusions. Bony thorax is unremarkable. IMPRESSION: Stable cardiomegaly with central pulmonary vascular congestion. Stable bilateral lung opacities are noted concerning for either edema or multifocal pneumonia, with probable small bilateral pleural effusions. Aortic Atherosclerosis (ICD10-I70.0). Electronically Signed   By: Marijo Conception M.D.   On: 02/14/2020 14:47   DG CHEST PORT 1 VIEW  Result Date: 02/02/2020 CLINICAL DATA:  Pneumonia. EXAM: PORTABLE CHEST 1 VIEW COMPARISON:  01/22/2020. FINDINGS: Tracheostomy tube and right central line in unchanged position. Heart size unchanged. Diffuse bilateral pulmonary infiltrates/edema again noted. Bibasilar atelectasis again noted. Bilateral pleural effusions appear unchanged. No pneumothorax. Chest is unchanged from prior exam. IMPRESSION: 1.  Tracheostomy tube and right central line in unchanged position. 2. Diffuse bilateral pulmonary infiltrates/edema, bibasilar  atelectasis, bilateral pleural effusions again noted. Chest is unchanged from prior exam. Electronically Signed   By: Marcello Moores  Register   On: 02/02/2020 07:26   ECHOCARDIOGRAM COMPLETE  Result Date: 02/22/2020    ECHOCARDIOGRAM REPORT   Patient Name:   BRON SNELLINGS Date of Exam: 02/22/2020 Medical Rec #:  [id number]    Height:       69.0 in Accession #:    [id number]   Weight:       127.2 lb Date of Birth:  1946/03/22    BSA:          1.705 m Patient Age:    26 years     BP:           104/63 mmHg Patient Gender: M            HR:           77 bpm. Exam Location:  Inpatient Procedure: 2D Echo Indications:    Bacteremia 790.7 / R78.81  History:        Patient has prior history of Echocardiogram examinations, most                 recent 12/19/2019. Arrythmias:Atrial Fibrillation. Sepsis.  Sonographer:    Vikki Ports Turrentine Referring Phys: [id number] [facility] MIKHAIL  Sonographer Comments: Echo performed with patient supine and on artificial respirator and no subcostal window. No SSN.  Tracheostomy and peg tube in place. IMPRESSIONS  1. Left ventricular ejection fraction, by estimation, is 60 to 65%. The left ventricle has normal function. The left ventricle has no regional wall motion abnormalities. There is severe left ventricular hypertrophy. Left ventricular diastolic parameters  are consistent with Grade I diastolic dysfunction (impaired relaxation).  2. Right ventricular systolic function is normal. The right ventricular size is normal. There is mildly elevated pulmonary artery systolic pressure.  3. Left atrial size was mildly dilated.  4. Right atrial size was mildly dilated.  5. The mitral valve is grossly normal. Trivial mitral valve regurgitation.  6. The aortic valve is tricuspid. Aortic valve regurgitation is trivial. Conclusion(s)/Recommendation(s): No evidence of valvular vegetations on this transthoracic echocardiogram. Would recommend a transesophageal echocardiogram to exclude infective endocarditis if clinically indicated. FINDINGS  Left Ventricle: Left ventricular ejection fraction, by estimation, is 60 to 65%. The left ventricle has normal function. The left ventricle has no regional wall motion abnormalities. The left ventricular internal cavity size was normal in size. There is  severe left ventricular hypertrophy. Left ventricular diastolic parameters are consistent with Grade I diastolic dysfunction (impaired relaxation). Indeterminate filling pressures. Right Ventricle: The right ventricular size is normal. No increase in right ventricular wall thickness. Right ventricular systolic function is normal. There is mildly elevated pulmonary artery systolic pressure. The tricuspid regurgitant velocity is 3.00  m/s, and with an assumed right atrial pressure of 8 mmHg, the estimated right ventricular systolic pressure is 78.4 mmHg. Left Atrium: Left atrial size was mildly dilated. Right Atrium: Right atrial size was mildly dilated. Pericardium: Trivial pericardial effusion is  present. The pericardial effusion is posterior to the left ventricle. Mitral Valve: The mitral valve is grossly normal. Trivial mitral valve regurgitation. Tricuspid Valve: The tricuspid valve is grossly normal. Tricuspid valve regurgitation is mild. Aortic Valve: The aortic valve is tricuspid. Aortic valve regurgitation is trivial. Pulmonic Valve: The pulmonic valve was grossly normal. Pulmonic valve regurgitation is trivial. Aorta: The aortic root and ascending aorta are structurally normal, with no evidence of dilitation. IAS/Shunts: No atrial level shunt detected by color  flow Doppler.  LEFT VENTRICLE PLAX 2D LVIDd:         3.40 cm  Diastology LVIDs:         2.30 cm  LV e' lateral:   6.83 cm/s LV PW:         1.40 cm  LV E/e' lateral: 13.4 LV IVS:        1.40 cm  LV e' medial:    5.93 cm/s LVOT diam:     2.10 cm  LV E/e' medial:  15.4 LV SV:         56 LV SV Index:   33 LVOT Area:     3.46 cm  RIGHT VENTRICLE RV S prime:     11.10 cm/s TAPSE (M-mode): 1.4 cm LEFT ATRIUM           Index       RIGHT ATRIUM           Index LA diam:      3.50 cm 2.05 cm/m  RA Area:     20.80 cm LA Vol (A2C): 64.3 ml 37.72 ml/m RA Volume:   59.20 ml  34.73 ml/m LA Vol (A4C): 32.1 ml 18.83 ml/m  AORTIC VALVE LVOT Vmax:   102.00 cm/s LVOT Vmean:  72.600 cm/s LVOT VTI:    0.163 m  AORTA Ao Root diam: 4.00 cm MITRAL VALVE               TRICUSPID VALVE MV Area (PHT): 4.63 cm    TR Peak grad:   36.0 mmHg MV Decel Time: 164 msec    TR Vmax:        300.00 cm/s MV E velocity: 91.30 cm/s MV A velocity: 67.70 cm/s  SHUNTS MV E/A ratio:  1.35        Systemic VTI:  0.16 m                            Systemic Diam: 2.10 cm Lyman Bishop MD Electronically signed by Lyman Bishop MD Signature Date/Time: 02/22/2020/12:03:41 PM    Final     Labs:  CBC: Recent Labs    02/25/20 0151 02/26/20 0216 02/27/20 0316 02/27/20 0705  WBC 21.0* 17.9* 16.7* 15.8*  HGB 9.8* 9.0* 9.2* 9.1*  HCT 30.6* 28.4* 28.9* 27.7*  PLT 283 268 303 277     COAGS: Recent Labs    11/22/19 0617 02/14/20 1433  INR 1.6* 1.2  APTT 33  --     BMP: Recent Labs    02/24/20 0742 02/25/20 0151 02/26/20 0216 02/27/20 0316  NA 139 141 140 141  K 4.6 4.5 3.6 3.9  CL 99 98 100 98  CO2 19* 21* 23 22  GLUCOSE 182* 154* 166* 131*  BUN 128* 157* 95* 135*  CALCIUM 10.3 10.2 9.8 10.2  CREATININE 4.62* 4.88* 3.65* 4.73*  GFRNONAA 12* 11* 16* 11*  GFRAA 14* 13* 18* 13*    LIVER FUNCTION TESTS: Recent Labs    11/22/19 0617 11/30/19 0656 02/14/20 1433 02/20/20 1455 02/25/20 0151 02/27/20 0316  BILITOT 0.8  --  1.1  --  0.4  --   AST 36  --  29  --  62*  --   ALT 39  --  24  --  85*  --   ALKPHOS 161*  --  116  --  219*  --   PROT 5.1*  --  8.3*  --  8.6*  --  ALBUMIN 1.3*   < > 3.4* 2.6* 2.5* 2.3*   < > = values in this interval not displayed.    TUMOR MARKERS: No results for input(s): AFPTM, CEA, CA199, CHROMGRNA in the last 8760 hours.  Assessment and Plan:  Renal failure secondary sepsis/shock Developed bacteremia 5/28 and line was removed-- temp placed Line Holiday sine then Now afeb 48 hrs; wbc trending down  15.8 Need for ongoing dialysis-- scheduled for new tunneled catheter placement in IR 02/28/20 Risks and benefits discussed with the patient's sister Deboraoh via phone including, but not limited to bleeding, infection, vascular injury, pneumothorax which may require chest tube placement, air embolism or even death  All questions were answered, she is agreeable to proceed. Consent signed and in chart.   Thank you for this interesting consult.  I greatly enjoyed meeting Shane Banks and look forward to participating in their care.  A copy of this report was sent to the requesting provider on this date.  Electronically Signed: Lavonia Drafts, PA-C 02/27/2020, 3:33 PM   I spent a total of 20 Minutes    in face to face in clinical consultation, greater than 50% of which was counseling/coordinating care for tunneled  HD cath placement

## 2020-02-28 ENCOUNTER — Inpatient Hospital Stay (HOSPITAL_COMMUNITY): Payer: Medicare HMO

## 2020-02-28 HISTORY — PX: IR US GUIDE VASC ACCESS RIGHT: IMG2390

## 2020-02-28 HISTORY — PX: IR FLUORO GUIDE CV LINE RIGHT: IMG2283

## 2020-02-28 LAB — COMPREHENSIVE METABOLIC PANEL
ALT: 47 U/L — ABNORMAL HIGH (ref 0–44)
AST: 39 U/L (ref 15–41)
Albumin: 2.3 g/dL — ABNORMAL LOW (ref 3.5–5.0)
Alkaline Phosphatase: 233 U/L — ABNORMAL HIGH (ref 38–126)
Anion gap: 19 — ABNORMAL HIGH (ref 5–15)
BUN: 120 mg/dL — ABNORMAL HIGH (ref 8–23)
CO2: 22 mmol/L (ref 22–32)
Calcium: 9.7 mg/dL (ref 8.9–10.3)
Chloride: 98 mmol/L (ref 98–111)
Creatinine, Ser: 4.22 mg/dL — ABNORMAL HIGH (ref 0.61–1.24)
GFR calc Af Amer: 15 mL/min — ABNORMAL LOW (ref >60)
GFR calc non Af Amer: 13 mL/min — ABNORMAL LOW (ref >60)
Glucose, Bld: 144 mg/dL — ABNORMAL HIGH (ref 70–99)
Potassium: 3.6 mmol/L (ref 3.5–5.1)
Sodium: 139 mmol/L (ref 135–145)
Total Bilirubin: 0.6 mg/dL (ref 0.3–1.2)
Total Protein: 8.2 g/dL — ABNORMAL HIGH (ref 6.5–8.1)

## 2020-02-28 LAB — GLUCOSE, CAPILLARY
Glucose-Capillary: 121 mg/dL — ABNORMAL HIGH (ref 70–99)
Glucose-Capillary: 92 mg/dL (ref 70–99)
Glucose-Capillary: 161 mg/dL — ABNORMAL HIGH (ref 70–99)
Glucose-Capillary: 98 mg/dL (ref 70–99)
Glucose-Capillary: 157 mg/dL — ABNORMAL HIGH (ref 70–99)
Glucose-Capillary: 133 mg/dL — ABNORMAL HIGH (ref 70–99)

## 2020-02-28 LAB — CBC
HCT: 28.9 % — ABNORMAL LOW (ref 39.0–52.0)
Hemoglobin: 9.1 g/dL — ABNORMAL LOW (ref 13.0–17.0)
MCH: 25.8 pg — ABNORMAL LOW (ref 26.0–34.0)
MCHC: 31.5 g/dL (ref 30.0–36.0)
MCV: 81.9 fL (ref 80.0–100.0)
Platelets: 268 10*3/uL (ref 150–400)
RBC: 3.53 MIL/uL — ABNORMAL LOW (ref 4.22–5.81)
RDW: 18.5 % — ABNORMAL HIGH (ref 11.5–15.5)
WBC: 16.2 10*3/uL — ABNORMAL HIGH (ref 4.0–10.5)
nRBC: 0 % (ref 0.0–0.2)

## 2020-02-28 MED ORDER — SODIUM CHLORIDE 0.9 % IV SOLN: 100.0000 mL | INTRAVENOUS | Status: DC | PRN

## 2020-02-28 MED ORDER — LIDOCAINE HCL 1 % IJ SOLN
INTRAMUSCULAR | Status: AC
  Filled 2020-02-28: qty 20

## 2020-02-28 MED ORDER — CHLORHEXIDINE GLUCONATE CLOTH 2 % EX PADS: 6.0000 | MEDICATED_PAD | Freq: Every day | CUTANEOUS | Status: DC

## 2020-02-28 MED ORDER — HEPARIN SODIUM (PORCINE) 1000 UNIT/ML DIALYSIS
1000.0000 [IU] | INTRAMUSCULAR | Status: DC | PRN
  Administered 2020-03-02: 3800 [IU] via INTRAVENOUS_CENTRAL
  Administered 2020-03-04: 2000 [IU] via INTRAVENOUS_CENTRAL
  Administered 2020-03-07: 1000 [IU] via INTRAVENOUS_CENTRAL
  Filled 2020-02-28: qty 1

## 2020-02-28 MED ORDER — HEPARIN SODIUM (PORCINE) 1000 UNIT/ML DIALYSIS
1500.0000 [IU] | INTRAMUSCULAR | Status: AC | PRN
  Administered 2020-02-29: 3800 [IU] via INTRAVENOUS_CENTRAL

## 2020-02-28 MED ORDER — ALTEPLASE 2 MG IJ SOLR
2.0000 mg | Freq: Once | INTRAMUSCULAR | Status: DC | PRN
  Filled 2020-02-28: qty 2

## 2020-02-28 MED ORDER — LIDOCAINE-PRILOCAINE 2.5-2.5 % EX CREA
1.0000 "application " | TOPICAL_CREAM | CUTANEOUS | Status: DC | PRN
  Filled 2020-02-28: qty 5

## 2020-02-28 MED ORDER — JUVEN PO PACK
1.0000 | PACK | Freq: Two times a day (BID) | ORAL | Status: DC
  Administered 2020-02-28 – 2020-04-24 (×103): 1
  Filled 2020-02-28 (×98): qty 1

## 2020-02-28 MED ORDER — PENTAFLUOROPROP-TETRAFLUOROETH EX AERO: 1.0000 "application " | INHALATION_SPRAY | CUTANEOUS | Status: DC | PRN

## 2020-02-28 MED ORDER — LIDOCAINE HCL (PF) 1 % IJ SOLN: 5.0000 mL | INTRAMUSCULAR | Status: DC | PRN

## 2020-02-28 MED ORDER — MIDAZOLAM HCL 2 MG/2ML IJ SOLN
1.0000 mg | INTRAMUSCULAR | Status: DC | PRN
  Administered 2020-03-01 – 2020-03-03 (×3): 1 mg via INTRAVENOUS
  Filled 2020-02-28 (×3): qty 2

## 2020-02-28 MED ORDER — HEPARIN SODIUM (PORCINE) 1000 UNIT/ML IJ SOLN
INTRAMUSCULAR | Status: AC
  Filled 2020-02-28: qty 1

## 2020-02-28 MED ORDER — MIDAZOLAM HCL 2 MG/2ML IJ SOLN: 1.0000 mg | INTRAMUSCULAR | Status: DC | PRN

## 2020-02-28 MED ORDER — LIDOCAINE HCL (PF) 1 % IJ SOLN
INTRAMUSCULAR | Status: AC | PRN
  Administered 2020-02-28: 5 mL

## 2020-02-28 MED ORDER — "THROMBI-PAD 3""X3"" EX PADS"
1.0000 | MEDICATED_PAD | Freq: Once | CUTANEOUS | Status: AC
  Administered 2020-02-28: 1 via TOPICAL
  Filled 2020-02-28: qty 1

## 2020-02-28 MED ORDER — VANCOMYCIN HCL IN DEXTROSE 1-5 GM/200ML-% IV SOLN
INTRAVENOUS | Status: AC
  Filled 2020-02-28: qty 200

## 2020-02-28 MED ORDER — VANCOMYCIN HCL 500 MG/100ML IV SOLN
INTRAVENOUS | Status: AC | PRN
  Administered 2020-02-28: 1000 mg via INTRAVENOUS

## 2020-02-28 NOTE — Progress Notes (Signed)
Pt transported from 43M 04 to IR on the ventilator and back without incident.

## 2020-02-28 NOTE — Progress Notes (Signed)
Received consult for midline placement. Pt with new tunneled IJ. LUE assessed with ultrasound. Long 20g placed in forearm to preserve upper arm veins and mitigate risk for infection.

## 2020-02-28 NOTE — Procedures (Signed)
  Procedure: R IJ tunneled Palindrome 23 HD CVC placement   EBL:   minimal Complications:  none immediate  See full dictation in [facility].  Dillard Cannon MD Main # [phone] Pager  [phone]

## 2020-02-28 NOTE — Progress Notes (Addendum)
11am: CSW faxed patient's referral to [facility] for review.  9:30am: CSW attempted to reach admissions staff at [facility] in [place], [place] to make a referral for this patient, no answer so a voicemail was left requesting a return call.  Madilyn Fireman, MSW, LCSW-A Transitions of Care  Clinical Social Worker  [facility]  Medical ICU [phone]

## 2020-02-28 NOTE — Progress Notes (Addendum)
NAME:  Shane Banks, MRN:  [id number], DOB:  October 06, 1945, LOS: 35 ADMISSION DATE:  02/14/2020, CONSULTATION DATE:  5/19 REFERRING MD:  Dr. Alvino Chapel, CHIEF COMPLAINT:  Hemoptysis   Brief History   74 year old male. COVID ARDS in January. Unfortunately required trach and LTACH placement. Had been tolerating trach collar, but on 5/19 he developed hemoptysis and hypoxemic respiratory failure requiring vent.   History of present illness   74 year old male with past medical history as below, which is significant for Atrial fibrillation and stroke. He was admitted to [facility] for [place] 19 pneumonia, which unfortunately progressed to ARDS.  It seems as though his highest vent settings were 60% FiO2 and 8 of PEEP.  He had very little improvement in his ventilatory and oxygenation use over the course of his hospitalization and ultimately underwent tracheostomy and [facility] [place] on February [facility]. His course at [facility] was complicated by ongoing infections including UTI, MDRO pseudomonas pneumonia, and stenotrophomonas bacteremia. As a result of sepsis and shock he developed renal failure requiring dialysis, which he now receives M/W/F. In the morning hours oh 5/19 he developed hemoptysis. He had previously been taken off anticoagulation (for AF) due to anemia with positive hemoccult. Transferred to [facility] ED for evaluation of hemoptysis. Prior to the transfer he had been tolerating 28% ATC.   Past Medical History   has a past medical history of Acute on chronic respiratory failure with hypoxia ([place]), Atrial fibrillation with RVR ([place]), BPH (benign prostatic hyperplasia), COVID-19 virus infection, Pneumonia due to COVID-19 virus, Severe sepsis ([place]), and Stroke ([place]).   Significant Hospital Events   1/8  Admit to [facility] for COVID PNA 2/23 Transfer to [facility] on vent 5/19 Transfer to [facility] for hemoptysis. #6 cuffed trach placed. Back on  vent. Bronch performed 5/20 Transitioned to trach collar.  5/22 back on vent full time 5/24 back on antibiotics for fevers 6/1 Initiating PSV weans  Consults:  ENT nephro ID  Procedures:  ENT flex scope 5/19 > no bleeding source identified FOB 5/19 > no pulmonary bleeding source identified.   Significant Diagnostic Tests:  CXR 5/28 >> R CVC placement confirmed Slightly improved aeration at the right base. 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 Edema in the muscles around the hips and in the posterior paraspinal musculature of the lower lumbar spine and in the buttocks which could represent myositis. Fluid-fluid level in the bladder may represent protein or debris in the bladder. The possibility of urinary tract infection should be considered.  Micro Data:    tracheal aspirate 5/7> for Pseudomonas A  BAL 5/19 > no growth 5/19 blood > NG 5/22 trach aspirate>> candida [place] 5/22 blood>> 1/4 GPC> staph epi 5/25 blood>> GPC (enterococcus on biofire) 2/4 bottles>> E. Faecalis, staph epi. 5/25 trach aspirate>>many PMN, few GPR, rare GPC> diphtheroids 5/25 blood cx>> + for Enterococcus Faecalis (gent resistant)/ Staphylococcus Epidermidis (tetracycline, vanc, rifampin sensitive) 5/27 BCx> ngtd    Antimicrobials:  Levofloxacin 5/24>5/26 vanc 5/25> Ceftriaxone  5/27> Metronidazole 5/27>   Interim history/subjective:   NAEO Exhaled volumes on vent consistently 100-273m less than inhaled volumes  Objective   Blood pressure 108/70, pulse (!) 104, temperature 98.9 F (37.2 C), temperature source Oral, resp. rate (!) 25, height _0  (1.753 m), weight 58.2  kg, SpO2 100 %.    Vent Mode: PRVC FiO2 (%):  [30 %] 30 % Set Rate:  [16 bmp] 16 bmp Vt Set:  [480 mL] 480 mL PEEP:  [5 cmH20] 5 cmH20 Pressure  Support:  [12 cmH20] 12 cmH20 Plateau Pressure:  [12 cmH20-30 cmH20] 30 cmH20   Intake/Output Summary (Last 24 hours) at 02/28/2020 0730 Last data filed at 02/28/2020 0400 Gross per 24 hour  Intake 876.09 ml  Output 1195 ml  Net -318.91 ml   Filed Weights   02/26/20 0447 02/27/20 0414 02/28/20 0356  Weight: 57.8 kg 56.9 kg 58.2 kg    Examination:   Gen:      Chronically ill appearing, frail older adult M, trach/vent NAD  HEENT:  NCAT  #6 cuffed Trach secure.  Lungs:    Increased RR, no accessory muscle use. Scattered rhonchi improve with cough  CV:         RRR s1s2 no rgm  Abd:      Soft flat ndnt. + bowel sounds  Ext:    No cyanosis clubbing or edema  Skin:      Clean dry warm Neuro:  Awake, following commands. [facility] Problem list     Assessment & Plan:   Acute on chronic hypoxemic respiratory failure requiring tracheostomy, prolonged MV: he has a trach s/p COVID pneumonia earlier this year. His oxygenation had  improved to tolerate 28% ATC, but has been back on the vent for several days. Course since complicated by multiple pneumonias, which is seems he has complete tx for (MDRO pseudomonas treated with inhaled tobramycin). Now he has had hemoptysis and aspirated blood. Decompensated in ER in this setting. Hemoptysis seems to be originating from stoma. Ulceration?  Has a cuffed trach placed by ENT.  No active bleeding from the blood on bronchoscopy LUL pneumonia- diphtheroids on cx.  Plan -Plan to change trach today-- concern for cuff leak despite high cuff pressure vs volume loss around cuff. discussed with PCCM physician and RRT  -Cont efforts at weaning vent. Goal PSV weans then to TC -VAP bundle -Cont vanc, flagyl, fortaz  -Cont cuffed trach until progressed to TC  -Check CXR 6/3 AM    Best practice:  Per primary team   Eliseo Gum MSN, AGACNP-BC [place] [phone] If no answer, [phone] 02/28/2020, 7:30  AM

## 2020-02-28 NOTE — Progress Notes (Signed)
Nutrition Follow-up  DOCUMENTATION CODES:   Severe malnutrition in context of chronic illness  INTERVENTION:   Continue TF via PEG: Nepro at 45 ml/h (1080 ml per day) Pro-stat 30 ml BID  Provides 2144 kcal, 117 gm protein, 785 ml free water daily  Add Juven BID, each packet provides 80 calories, 8 grams of carbohydrate, 2.5  grams of protein (collagen), 7 grams of L-arginine and 7 grams of L-glutamine; supplement contains CaHMB, Vitamins C, E, B12 and Zinc to promote wound healing.  NUTRITION DIAGNOSIS:   Severe Malnutrition related to chronic illness(chronic respiratory failure related to COVID ARDS) as evidenced by severe muscle depletion, severe fat depletion.  Ongoing  GOAL:   Patient will meet greater than or equal to 90% of their needs   Met with TF  MONITOR:   Vent status, Labs, TF tolerance, Skin  REASON FOR ASSESSMENT:   Ventilator, Other (Comment)(hx PEG)    ASSESSMENT:   74 yo male admitted with hemoptysis. 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   Discussed patient in ICU rounds and with RN today. Patient is tolerating TF well via PEG. Midline placed today.  L IJ tunneled HD cath placed today. Plan for HD tomorrow. MRI pelvis/sacrum showed osteomyelitis of fifth sacral segment. Plans for IV antibiotics x 6 weeks.  Concern for ongoing aspiration with persistent BLL infiltrates.   Patient remains intubated on ventilator support MV: 9.2 L/min Temp (24hrs), Avg:98.9 F (37.2 C), Min:98.3 F (36.8 C), Max:99.2 F (37.3 C)   Labs reviewed. BUN 120 (H), Creat 4.22 (H), K 3.6 (WNL), phos 5.8 on 6/1 (H) CBG: 121-92-161  Medications reviewed and include colace, novolog, miralax.  59.3 kg today, down from 65 kg on admission. I/O + 2.3 L since admission.   Diet Order:   Diet Order            Diet NPO time specified  Diet effective now              EDUCATION NEEDS:    No education needs have been identified at this time  Skin:  Skin Assessment: Skin Integrity Issues: Skin Integrity Issues:: Stage IV, DTI DTI: L hip Stage IV: sacrum    Last BM:  5/25 colostomy output 160 ml  Height:   Ht Readings from Last 1 Encounters:  02/14/20 5' 9" (1.753 m)    Weight:   Wt Readings from Last 1 Encounters:  02/28/20 58.2 kg    Ideal Body Weight:  72.7 kg  BMI:  Body mass index is 18.95 kg/m.  Estimated Nutritional Needs:   Kcal:  1900-2200  Protein:  100-125 gm  Fluid:  1 L + UOP    Lucas Mallow, RD, LDN, CNSC Please refer to Amion for contact information.

## 2020-02-28 NOTE — Progress Notes (Signed)
[facility] Progress Note  Subjective: seen in ICU  Vitals:   02/28/20 0741 02/28/20 0757 02/28/20 0800 02/28/20 0900  BP:   105/65 110/65  Pulse: (!) 111  80 (!) 102  Resp: (!) 30  17 (!) 27  Temp:      TempSrc:      SpO2: 100% 100% 100% 100%  Weight:      Height:        Exam:   In ICU on vent, not responding  no jvd  Chest cta bilat ant/ lat  Cor reg no RG  Abd soft ntnd no ascites, +llq ostomy, + peg tube   Ext no LE edema, feet in boots   Not following commands, not responding, eyes half open     OP HD: came [facility] after [place] hospitalization, then had severe infections on [facility] and developed AKI felt due to IV gent toxicity requiring initiation of HD 12/16/19 at [facility]. Admitted to ICU for trach bleed on 5/19. Was getting MWF HD at [facility] prior to admit.    Assessment/ Plan: 1. Enterococcus/ MRSE bacteremia - TDC removed 5/26, per ID needs 6 wks vanc/ fortaz/ flagyl for polymicrobial sepsis and osteomyelitis.  2. Sacral osteo/ decub stage IV - per ID, as above 3. ESRD/ AKI - on HD since mid March 2021, d/t gent toxicity. HD MWF at [facility]. Consulting IR for new TDC placement. Donah Driver w/o sig [place], not sure cause.  Plan HD tomorrow.  4. A/C resp failure - poss PNA, back on vent now, per CCM 5. Trach bleed - resolved, was reason for admit from [facility] to [facility] 6. Tracheobronchitis - sp course of doxycycline 7. AMS - multifactorial 8. Atrial fib - per primary 9. Anemia ckd - transfuse prn, Ca 11.0 corrected, phos 3.5- 6 here, use low Ca++ bath , check PTH 10. 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 SP COVID pna - original problem in Feb at [facility] 02/28/2020, 10:09 AM   Recent Labs  Lab 02/27/20 0316 02/27/20 0316 02/27/20 0705 02/28/20 0234  K 3.9  --   --  3.6  BUN 135*  --   --  120*  CREATININE 4.73*  --   --  4.22*  CALCIUM 10.2  --   --  9.7  PHOS 5.8*   --   --   --   HGB 9.2*   < > 9.1* 9.1*   < > = values in this interval not displayed.   Inpatient medications: . apixaban  2.5 mg Per Tube BID  . chlorhexidine gluconate (MEDLINE KIT)  15 mL Mouth Rinse BID  . Chlorhexidine Gluconate Cloth  6 each Topical Q0600  . diltiazem  30 mg Per Tube Q8H  . docusate  100 mg Per Tube BID  . feeding supplement (PRO-STAT SUGAR FREE 64)  30 mL Per Tube BID  . insulin aspart  0-6 Units Subcutaneous Q4H  . insulin aspart  2 Units Subcutaneous Q4H  . mouth rinse  15 mL Mouth Rinse 10 times per day  . metroNIDAZOLE  500 mg Per Tube Q8H  . pantoprazole (PROTONIX) IV  40 mg Intravenous Q24H  . polyethylene glycol  17 g Per Tube Daily  . vancomycin variable dose per unstable renal function (pharmacist dosing)   Does not apply See admin instructions   . sodium chloride    .  sodium chloride 10 mL/hr at 02/28/20 0900  . cefTAZidime (FORTAZ)  IV Stopped (02/27/20 1927)  . feeding supplement (NEPRO CARB STEADY) Stopped (02/28/20 0000)   sodium chloride, sodium chloride, acetaminophen (TYLENOL) oral liquid 160 mg/5 mL, fentaNYL (SUBLIMAZE) injection, fentaNYL (SUBLIMAZE) injection, heparin, heparin, lidocaine (PF), lidocaine-prilocaine, pentafluoroprop-tetrafluoroeth

## 2020-02-28 NOTE — Progress Notes (Signed)
Arrived to 64M, pt off the floor. Requested RN re-enter consult when pt returns to unit.

## 2020-02-28 NOTE — Progress Notes (Signed)
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  Patient had recent history of Covid pneumonia and was on ventilator because he progressed to ARDS.  Because of little improvement in the ventilatory and oxygenation, he underwent tracheostomy and was discharged to [facility].  He had recent history of UTI, multidrug-resistant Pseudomonas pneumonia, stenotrophomonas bacteremia.  Because of sepsis and shock, he developed renal failure requiring dialysis.  On 5/19, he developed hemoptysis and was transferred to [facility] ED for evaluation.  PCCM,ID,Nephrology following here.  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  Due to lack of insurance, he cannot go back to [facility].  Assessment & Plan:   Principal Problem:   Enterococcal bacteremia Active Problems:   Acute respiratory failure (HCC)   Hemoptysis   Pressure injury of skin   Protein-calorie malnutrition, severe   Decubitus ulcer of sacral region, stage 4 (HCC)   Fever   Acute on chronic hypoxic respiratory failure: Status post trach due to respiratory failure from Covid pneumonia.  Currently on vent.  PCCM managing vent.  History of multiple episodes of pneumonia from different organisms and was treated for different antibiotics.  Most recently pneumonia has been multidrug-resistant Pseudomonas for which she was treated with Avycaz and [facility]cin [facility]bul[facility].  Possible ongoing aspiration also.  Presented with hemoptysis, aspiration of blood.  Patient has cuffed trach placed by ENT.  No active bleeding on bronchoscopy.  Paroxysmal A. fib: Currently rate is controlled.  Anticoagulation was  discontinued due to suspected GI bleed with several transfusion.  Currently on low-dose Eliquis.  Sepsis/bacteremia: Enterococcus and Staphylococcus epidermidis bacteremia.  Also has osteomyelitis fifth sacral segment.  Chest x-ray showed persistent bilateral lower lobe infiltrates.  High suspicion for ongoing aspiration.  Currently on vancomycin with hemodialysis.  Infectious disease consulted and following.  MRI pelvis/sacrum showed osteomyelitis of fifth sacral segment, edema in the muscle around both hips and proximal thighs with possible myositis.  Currently on vancomycin, ceftriaxone, Flagyl.  IR consulted for bone biopsy but due to headaches it was not done. ID recommended treatment for 6 weeks for sacral osteomyelitis and staph epidermidis bacteremia with vancomycin, ceftriaxone 2 g IV daily and metronidazole.  ESRD: On dialysis per nephrology.  Plan for tunnel catheter placement.  Chronic normocytic anemia: Secondary to ESRD, critical illness.  Has been transfused with PRBCs.  Currently hemoglobin stable.  Continue to monitor  Unstageable sacral pressure ulcer/osteomyelitis: Wound care following.  Discussion as above.  Cachexia/severe protein malnutrition: On tube feeds.  Nutrition following.  Mild elevated LFTs: Continue to monitor.  [facility]: [facility].  Anticipate discharge to [facility] but due to lack of insurance, placement has been difficult.    Nutrition Problem: Severe Malnutrition Etiology: chronic illness(chronic respiratory failure related to COVID ARDS)      DVT prophylaxis: Eliquis Code Status: DNR Family Communication: None present at the bedside Status is: Inpatient  Remains inpatient appropriate because:Unsafe d/c plan   Dispo: The patient is from: SNF              Anticipated d/c is to: SNF              Anticipated d/c date is: > 3 days  Patient currently is not medically stable to d/c.     Consultants: ID, PCCM, IR,  nephrology  Procedures: ENT flex scope 5/19 > no bleeding source identified FOB 5/19 > no pulmonary bleeding source identified.  Antimicrobials:  Anti-infectives (From admission, onward)   Start     Dose/Rate Route Frequency Ordered Stop   02/27/20 1800  cefTAZidime (FORTAZ) 1 g in sodium chloride 0.9 % 100 mL IVPB     1 g 200 mL/hr over 30 Minutes Intravenous Every 24 hours 02/27/20 0947 04/04/20 2359   02/27/20 1200  vancomycin (VANCOCIN) IVPB 500 mg/100 ml premix     500 mg 100 mL/hr over 60 Minutes Intravenous Once 02/27/20 0829 02/27/20 1343   02/24/20 1800  vancomycin (VANCOREADY) IVPB 500 mg/100 mL     500 mg 100 mL/hr over 60 Minutes Intravenous  Once 02/24/20 1308 02/24/20 1905   02/22/20 2200  metroNIDAZOLE (FLAGYL) tablet 500 mg     500 mg Per Tube Every 8 hours 02/22/20 0957 04/04/20 2359   02/22/20 1700  cefTRIAXone (ROCEPHIN) 2 g in sodium chloride 0.9 % 100 mL IVPB  Status:  Discontinued     2 g 200 mL/hr over 30 Minutes Intravenous Every 24 hours 02/22/20 0957 02/27/20 0947   02/22/20 1200  vancomycin (VANCOCIN) IVPB 500 mg/100 ml premix  Status:  Discontinued     500 mg 100 mL/hr over 60 Minutes Intravenous Every T-Th-Sa (Hemodialysis) 02/20/20 1253 02/21/20 1040   02/21/20 1431  vancomycin variable dose per unstable renal function (pharmacist dosing)      Does not apply See admin instructions 02/21/20 1431     02/21/20 1200  vancomycin (VANCOREADY) IVPB 500 mg/100 mL     500 mg 100 mL/hr over 60 Minutes Intravenous  Once 02/21/20 0956 02/21/20 1253   02/21/20 0714  vancomycin (VANCOCIN) 500-5 MG/100ML-% IVPB  Status:  Discontinued    Note to Pharmacy: Ashley Akin   : cabinet override      02/21/20 0714 02/21/20 1346   02/20/20 1400  vancomycin (VANCOREADY) IVPB 1250 mg/250 mL     1,250 mg 166.7 mL/hr over 90 Minutes Intravenous  Once 02/20/20 1253 02/20/20 1538   02/20/20 1200  levofloxacin (LEVAQUIN) IVPB 500 mg  Status:  Discontinued     500 mg 100 mL/hr  over 60 Minutes Intravenous Every 48 hours 02/20/20 1020 02/21/20 0943   02/15/20 1545  vancomycin (VANCOCIN) IVPB 1000 mg/200 mL premix  Status:  Discontinued     1,000 mg 200 mL/hr over 60 Minutes Intravenous Every 24 hours 02/14/20 1530 02/14/20 1745   02/15/20 0330  ceFEPIme (MAXIPIME) 2 g in sodium chloride 0.9 % 100 mL IVPB  Status:  Discontinued     2 g 200 mL/hr over 30 Minutes Intravenous Every 12 hours 02/14/20 1531 02/14/20 1531   02/14/20 1531  ceFEPIme (MAXIPIME) 2 g in sodium chloride 0.9 % 100 mL IVPB  Status:  Discontinued     2 g 200 mL/hr over 30 Minutes Intravenous Every 12 hours 02/14/20 1531 02/14/20 1745   02/14/20 1530  ceFEPIme (MAXIPIME) 2 g in sodium chloride 0.9 % 100 mL IVPB  Status:  Discontinued     2 g 200 mL/hr over 30 Minutes Intravenous  Once 02/14/20 1520 02/14/20 1531   02/14/20 1530  vancomycin (VANCOREADY) IVPB 1500 mg/300 mL  Status:  Discontinued     1,500 mg 150 mL/hr over 120 Minutes Intravenous  Once 02/14/20 1530 02/14/20 1745      Subjective:  Patient seen and examined at the bedside this morning.  Currently hemodynamically stable.  Looks comfortable, alert and awake.  Follows commands.  Plan for tunnel catheter placement today.  Objective: Vitals:   02/28/20 0700 02/28/20 0711 02/28/20 0741 02/28/20 0757  BP: (!) 96/59     Pulse: 79  (!) 111   Resp: (!) 24  (!) 30   Temp:  98.9 F (37.2 C)    TempSrc:  Oral    SpO2: 100%  100% 100%  Weight:      Height:        Intake/Output Summary (Last 24 hours) at 02/28/2020 0811 Last data filed at 02/28/2020 0700 Gross per 24 hour  Intake 851.08 ml  Output 1195 ml  Net -343.92 ml   Filed Weights   02/26/20 0447 02/27/20 0414 02/28/20 0356  Weight: 57.8 kg 56.9 kg 58.2 kg    Examination:  General exam: Appears calm and comfortable ,Not in distress HEENT:PERRL,Tach Respiratory system: Bilateral equal air entry,no wheezes or crackles  Cardiovascular system: Afib, No JVD, murmurs, rubs,  gallops or clicks. No pedal edema. Gastrointestinal system: Abdomen is nondistended, soft and nontender.PEG.Colostomy.Normal bowel sounds heard. Central nervous system: Alert and awake.  Extremities: No edema, no clubbing ,no cyanosis Skin: Non unstageable sacral ulcer   Data Reviewed: I have personally reviewed following labs and imaging studies  CBC: Recent Labs  Lab 02/25/20 0151 02/26/20 0216 02/27/20 0316 02/27/20 0705 02/28/20 0234  WBC 21.0* 17.9* 16.7* 15.8* 16.2*  HGB 9.8* 9.0* 9.2* 9.1* 9.1*  HCT 30.6* 28.4* 28.9* 27.7* 28.9*  MCV 82.3 82.1 81.6 81.5 81.9  PLT 283 268 303 277 161   Basic Metabolic Panel: Recent Labs  Lab 02/24/20 0742 02/25/20 0151 02/26/20 0216 02/27/20 0316 02/28/20 0234  NA 139 141 140 141 139  K 4.6 4.5 3.6 3.9 3.6  CL 99 98 100 98 98  CO2 19* 21* _0 GLUCOSE 182* 154* 166* 131* 144*  BUN 128* 157* 95* 135* 120*  CREATININE 4.62* 4.88* 3.65* 4.73* 4.22*  CALCIUM 10.3 10.2 9.8 10.2 9.7  MG  --  2.5* 2.1  --   --   PHOS  --   --   --  5.8*  --    GFR: Estimated Creatinine Clearance: 13 mL/min (A) (by C-G formula based on SCr of 4.22 mg/dL (H)). Liver Function Tests: Recent Labs  Lab 02/25/20 0151 02/27/20 0316 02/28/20 0234  AST 62*  --  39  ALT 85*  --  47*  ALKPHOS 219*  --  233*  BILITOT 0.4  --  0.6  PROT 8.6*  --  8.2*  ALBUMIN 2.5* 2.3* 2.3*   No results for input(s): LIPASE, AMYLASE in the last 168 hours. No results for input(s): AMMONIA in the last 168 hours. Coagulation Profile: No results for input(s): INR, PROTIME in the last 168 hours. Cardiac Enzymes: No results for input(s): CKTOTAL, CKMB, CKMBINDEX, TROPONINI in the last 168 hours. BNP (last 3 results) No results for input(s): PROBNP in the last 8760 hours. HbA1C: No results for input(s): HGBA1C in the last 72 hours. CBG: Recent Labs  Lab 02/27/20 1518 02/27/20 1929 02/27/20 2341 02/28/20 0342 02/28/20 0710  GLUCAP 177* 176* 124* 121* 92    Lipid Profile: No results for input(s): CHOL, HDL, LDLCALC, TRIG, CHOLHDL, LDLDIRECT in the last 72 hours. Thyroid Function Tests: No results for input(s): TSH, T4TOTAL, FREET4, T3FREE, THYROIDAB in the last 72 hours. Anemia Panel: No results for input(s): VITAMINB12,  FOLATE, FERRITIN, TIBC, IRON, RETICCTPCT in the last 72 hours. Sepsis Labs: No results for input(s): PROCALCITON, LATICACIDVEN in the last 168 hours.  Recent Results (from the past 240 hour(s))  Culture, respiratory (non-expectorated)     Status: None   Collection Time: 02/20/20 12:38 PM   Specimen: Tracheal Aspirate; Respiratory  Result Value Ref Range Status   Specimen Description TRACHEAL ASPIRATE  Final   Special Requests NONE  Final   Gram Stain   Final    RARE WBC PRESENT, PREDOMINANTLY PMN RARE SQUAMOUS EPITHELIAL CELLS PRESENT FEW GRAM POSITIVE RODS RARE GRAM POSITIVE COCCI Performed at [facility], [place] [address] [place], [place] [address]    Culture   Final    ABUNDANT CORYNEBACTERIUM STRIATUM Standardized susceptibility testing for this organism is not available. FEW CANDIDA PARAPSILOSIS    Report Status 02/24/2020 FINAL  Final  Culture, blood (routine x 2)     Status: Abnormal   Collection Time: 02/20/20 12:50 PM   Specimen: BLOOD LEFT HAND  Result Value Ref Range Status   Specimen Description BLOOD LEFT HAND  Final   Special Requests   Final    BOTTLES DRAWN AEROBIC AND ANAEROBIC Blood Culture adequate volume   Culture  Setup Time   Final    GRAM POSITIVE COCCI IN CHAINS IN BOTH AEROBIC AND ANAEROBIC BOTTLES CRITICAL RESULT CALLED TO, READ BACK BY AND VERIFIED WITH: Jens Som AMEND [id number] FCP Performed at [facility], [place] [address] [place], [place] [address]    Culture (A)  Final    ENTEROCOCCUS FAECALIS STAPHYLOCOCCUS EPIDERMIDIS    Report Status 02/24/2020 FINAL  Final   Organism ID, Bacteria ENTEROCOCCUS FAECALIS  Final   Organism ID, Bacteria STAPHYLOCOCCUS  EPIDERMIDIS  Final      Susceptibility   Enterococcus faecalis - MIC*    AMPICILLIN <=2 SENSITIVE Sensitive     VANCOMYCIN 1 SENSITIVE Sensitive     GENTAMICIN SYNERGY RESISTANT Resistant     * ENTEROCOCCUS FAECALIS   Staphylococcus epidermidis - MIC*    CIPROFLOXACIN >=8 RESISTANT Resistant     ERYTHROMYCIN >=8 RESISTANT Resistant     GENTAMICIN >=16 RESISTANT Resistant     OXACILLIN >=4 RESISTANT Resistant     TETRACYCLINE 2 SENSITIVE Sensitive     VANCOMYCIN 2 SENSITIVE Sensitive     TRIMETH/SULFA 160 RESISTANT Resistant     CLINDAMYCIN >=8 RESISTANT Resistant     RIFAMPIN <=0.5 SENSITIVE Sensitive     Inducible Clindamycin NEGATIVE Sensitive     * STAPHYLOCOCCUS EPIDERMIDIS  Blood Culture ID Panel (Reflexed)     Status: Abnormal   Collection Time: 02/20/20 12:50 PM  Result Value Ref Range Status   Enterococcus species DETECTED (A) NOT DETECTED Final    Comment: CRITICAL RESULT CALLED TO, READ BACK BY AND VERIFIED WITH: PHARMD KAREN AMEND [id number] FCP    Vancomycin resistance NOT DETECTED NOT DETECTED Final   Listeria monocytogenes NOT DETECTED NOT DETECTED Final   Staphylococcus species DETECTED (A) NOT DETECTED Final    Comment: Methicillin (oxacillin) resistant coagulase negative staphylococcus. Possible blood culture contaminant (unless isolated from more than one blood culture draw or clinical case suggests pathogenicity). No antibiotic treatment is indicated for blood  culture contaminants. CRITICAL RESULT CALLED TO, READ BACK BY AND VERIFIED WITH: PHARMD KAREN AMEND [phone] [id number] FCP    Staphylococcus aureus (BCID) NOT DETECTED NOT DETECTED Final   Methicillin resistance DETECTED (A) NOT DETECTED Final    Comment: CRITICAL RESULT CALLED  TO, READ BACK BY AND VERIFIED WITH: PHARMD KAREN AMEND [phone] [id number] FCP    Streptococcus species NOT DETECTED NOT DETECTED Final   Streptococcus agalactiae NOT DETECTED NOT DETECTED Final   Streptococcus pneumoniae NOT DETECTED NOT  DETECTED Final   Streptococcus pyogenes NOT DETECTED NOT DETECTED Final   Acinetobacter baumannii NOT DETECTED NOT DETECTED Final   Enterobacteriaceae species NOT DETECTED NOT DETECTED Final   Enterobacter cloacae complex NOT DETECTED NOT DETECTED Final   Escherichia coli NOT DETECTED NOT DETECTED Final   Klebsiella oxytoca NOT DETECTED NOT DETECTED Final   Klebsiella pneumoniae NOT DETECTED NOT DETECTED Final   Proteus species NOT DETECTED NOT DETECTED Final   Serratia marcescens NOT DETECTED NOT DETECTED Final   Haemophilus influenzae NOT DETECTED NOT DETECTED Final   Neisseria meningitidis NOT DETECTED NOT DETECTED Final   Pseudomonas aeruginosa NOT DETECTED NOT DETECTED Final   Candida albicans NOT DETECTED NOT DETECTED Final   Candida glabrata NOT DETECTED NOT DETECTED Final   Candida krusei NOT DETECTED NOT DETECTED Final   Candida parapsilosis NOT DETECTED NOT DETECTED Final   Candida tropicalis NOT DETECTED NOT DETECTED Final    Comment: Performed at [facility], [place] [address] [place], [place] [address]  Culture, blood (routine x 2)     Status: None   Collection Time: 02/20/20 12:58 PM   Specimen: BLOOD LEFT ARM  Result Value Ref Range Status   Specimen Description BLOOD LEFT ARM  Final   Special Requests   Final    BOTTLES DRAWN AEROBIC AND ANAEROBIC Blood Culture adequate volume   Culture   Final    NO GROWTH 5 DAYS Performed at [facility], [place] [address] [place], [place] [address]    Report Status 02/25/2020 FINAL  Final  Culture, blood (Routine X 2) w Reflex to ID Panel     Status: None   Collection Time: 02/22/20 11:25 AM   Specimen: BLOOD RIGHT HAND  Result Value Ref Range Status   Specimen Description BLOOD RIGHT HAND  Final   Special Requests   Final    BOTTLES DRAWN AEROBIC ONLY Blood Culture adequate volume   Culture   Final    NO GROWTH 5 DAYS Performed at [facility], [place] [address], [place], [place] [address]    Report Status  02/27/2020 FINAL  Final  Culture, blood (Routine X 2) w Reflex to ID Panel     Status: None   Collection Time: 02/22/20 11:25 AM   Specimen: BLOOD RIGHT HAND  Result Value Ref Range Status   Specimen Description BLOOD RIGHT HAND  Final   Special Requests   Final    BOTTLES DRAWN AEROBIC ONLY Blood Culture adequate volume   Culture   Final    NO GROWTH 5 DAYS Performed at [facility], [place] [address], [place],  [address]    Report Status 02/27/2020 FINAL  Final         Radiology Studies: DG CHEST PORT 1 VIEW  Result Date: 02/27/2020 CLINICAL DATA:  Tachypnea, hypotension, increased trach secretions EXAM: PORTABLE CHEST 1 VIEW COMPARISON:  02/25/2020 FINDINGS: No significant interval change in AP portable radiograph with heterogeneous airspace opacity and pleural effusions, left greater than right. No new airspace opacity. Tracheostomy remains in position. Right neck multi lumen vascular catheter, tip over the lower SVC. IMPRESSION: No significant interval change in AP portable radiograph with heterogeneous airspace opacity and pleural effusions, left greater than right. No new  airspace opacity. Electronically Signed   By: Eddie Candle M.D.   On: 02/27/2020 12:43        Scheduled Meds: . apixaban  2.5 mg Per Tube BID  . chlorhexidine gluconate (MEDLINE KIT)  15 mL Mouth Rinse BID  . Chlorhexidine Gluconate Cloth  6 each Topical Q0600  . diltiazem  30 mg Per Tube Q8H  . docusate  100 mg Per Tube BID  . feeding supplement (PRO-STAT SUGAR FREE 64)  30 mL Per Tube BID  . insulin aspart  0-6 Units Subcutaneous Q4H  . insulin aspart  2 Units Subcutaneous Q4H  . mouth rinse  15 mL Mouth Rinse 10 times per day  . metroNIDAZOLE  500 mg Per Tube Q8H  . pantoprazole (PROTONIX) IV  40 mg Intravenous Q24H  . polyethylene glycol  17 g Per Tube Daily  . vancomycin variable dose per unstable renal function (pharmacist dosing)   Does not apply See admin instructions   Continuous  Infusions: . sodium chloride    . sodium chloride 10 mL/hr at 02/28/20 0400  . cefTAZidime (FORTAZ)  IV Stopped (02/27/20 1927)  . feeding supplement (NEPRO CARB STEADY) Stopped (02/28/20 0000)     LOS: 14 days    Time spent: 35 mins.More than 50% of that time was spent in counseling and/or coordination of care.      Shelly Coss, MD [facility] P6/10/2019, 8:11 AM

## 2020-02-29 ENCOUNTER — Inpatient Hospital Stay (HOSPITAL_COMMUNITY): Payer: Medicare HMO

## 2020-02-29 LAB — CBC WITH DIFFERENTIAL/PLATELET
Abs Immature Granulocytes: 0.65 10*3/uL — ABNORMAL HIGH (ref 0.00–0.07)
Basophils Absolute: 0.1 10*3/uL (ref 0.0–0.1)
Basophils Relative: 1 %
Eosinophils Absolute: 0.5 10*3/uL (ref 0.0–0.5)
Eosinophils Relative: 3 %
HCT: 27.9 % — ABNORMAL LOW (ref 39.0–52.0)
Hemoglobin: 8.8 g/dL — ABNORMAL LOW (ref 13.0–17.0)
Immature Granulocytes: 4 %
Lymphocytes Relative: 15 %
Lymphs Abs: 2.7 10*3/uL (ref 0.7–4.0)
MCH: 26.2 pg (ref 26.0–34.0)
MCHC: 31.5 g/dL (ref 30.0–36.0)
MCV: 83 fL (ref 80.0–100.0)
Monocytes Absolute: 1.7 10*3/uL — ABNORMAL HIGH (ref 0.1–1.0)
Monocytes Relative: 10 %
Neutro Abs: 11.6 10*3/uL — ABNORMAL HIGH (ref 1.7–7.7)
Neutrophils Relative %: 67 %
Platelets: 254 10*3/uL (ref 150–400)
RBC: 3.36 MIL/uL — ABNORMAL LOW (ref 4.22–5.81)
RDW: 18.6 % — ABNORMAL HIGH (ref 11.5–15.5)
WBC: 17.2 10*3/uL — ABNORMAL HIGH (ref 4.0–10.5)
nRBC: 0 % (ref 0.0–0.2)

## 2020-02-29 LAB — GLUCOSE, CAPILLARY
Glucose-Capillary: 126 mg/dL — ABNORMAL HIGH (ref 70–99)
Glucose-Capillary: 158 mg/dL — ABNORMAL HIGH (ref 70–99)
Glucose-Capillary: 135 mg/dL — ABNORMAL HIGH (ref 70–99)
Glucose-Capillary: 145 mg/dL — ABNORMAL HIGH (ref 70–99)
Glucose-Capillary: 166 mg/dL — ABNORMAL HIGH (ref 70–99)
Glucose-Capillary: 145 mg/dL — ABNORMAL HIGH (ref 70–99)

## 2020-02-29 LAB — BASIC METABOLIC PANEL
Anion gap: 18 — ABNORMAL HIGH (ref 5–15)
BUN: 151 mg/dL — ABNORMAL HIGH (ref 8–23)
CO2: 22 mmol/L (ref 22–32)
Calcium: 9.9 mg/dL (ref 8.9–10.3)
Chloride: 99 mmol/L (ref 98–111)
Creatinine, Ser: 4.97 mg/dL — ABNORMAL HIGH (ref 0.61–1.24)
GFR calc Af Amer: 12 mL/min — ABNORMAL LOW (ref >60)
GFR calc non Af Amer: 11 mL/min — ABNORMAL LOW (ref >60)
Glucose, Bld: 134 mg/dL — ABNORMAL HIGH (ref 70–99)
Potassium: 3.9 mmol/L (ref 3.5–5.1)
Sodium: 139 mmol/L (ref 135–145)

## 2020-02-29 LAB — VANCOMYCIN, RANDOM: Vancomycin Rm: 36

## 2020-02-29 LAB — PARATHYROID HORMONE, INTACT (NO CA): PTH: 29 pg/mL (ref 15–65)

## 2020-02-29 MED ORDER — LEVOTHYROXINE SODIUM 25 MCG PO TABS
25.0000 ug | ORAL_TABLET | Freq: Every day | ORAL | Status: DC
  Administered 2020-02-29 – 2020-04-24 (×56): 25 ug
  Filled 2020-02-29 (×56): qty 1

## 2020-02-29 MED ORDER — HEPARIN SODIUM (PORCINE) 1000 UNIT/ML IJ SOLN
1500.0000 [IU] | Freq: Once | INTRAMUSCULAR | Status: AC
  Administered 2020-02-29: 1500 [IU] via INTRAVENOUS

## 2020-02-29 NOTE — Progress Notes (Addendum)
2pm: CSW spoke with the patient's sister [name] at [phone] to provide her with updates regarding placement. CSW answered questions and no further concerns were mentioned.  CSW spoke with [place] at [facility] who states this patient's referral is currently under review by clinical staff. Tabitha to return call to [place] after the review is completed.  Madilyn Fireman, MSW, LCSW-A Transitions of Care  Clinical Social Worker  [facility] Emergency Departments  Medical ICU [phone]

## 2020-02-29 NOTE — Progress Notes (Addendum)
Pharmacy Antibiotic Note  Elhadj Girton is a 74 y.o. 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 Line pulled 5/26, line holiday and had it replaced 5/28. TTE negative. Repeat BCx are negative. ID planning 6 weeks of therapy  Patient was not scheduled for HD on 6/2 but received vancomycin 1g IV dose per IR physician. Vancomycin random level this morning is supratherapeutic at 36. Calculated vancomycin level after HD session today (if patient receives full dialysis session) is ~20. Will not give dose after HD today, as patient will still be therapeutic.  HD MWF PTA; however, Nephrology will do HD TTS this week. Will follow for further HD schedule plans.  Plan: Hold vancomycin dose post-HD today; f/u resume vancomycin 500mg  IV qHD as levels are appropriate (likely post-HD 6/5) Ceftazidime 1g IV q24h for now until HD schedule more regular Flagyl 500mg  IV q8h per ID Pre-HD vancomycin level as indicated F/u HD schedule/tolerance Abx end date - 04/04/20 per ID   Height: 5\' 9"  (175.3 cm) Weight: 58.7 kg (129 lb 6.6 oz) IBW/kg (Calculated) : 70.7  Temp (24hrs), Avg:98.1 F (36.7 C), Min:97.5 F (36.4 C), Max:98.8 F (37.1 C)  Recent Labs  Lab 02/25/20 0151 02/25/20 0151 02/26/20 0216 02/27/20 0316 02/27/20 0705 02/28/20 0234 02/29/20 0230  WBC 21.0*   < > 17.9* 16.7* 15.8* 16.2* 17.2*  CREATININE 4.88*  --  3.65* 4.73*  --  4.22* 4.97*  VANCORANDOM  --   --  21  --   --   --  36   < > = values in this interval not displayed.    Estimated Creatinine Clearance: 11.2 mL/min (A) (by C-G formula based on SCr of 4.97 mg/dL (H)).    Allergies  Allergen Reactions  . Aspirin Rash  . Cephalexin Other (See Comments)    dizziness  . Penicillins     Itching,swelling     Arturo Morton, PharmD, BCPS Please check AMION for all [facility] contact numbers Clinical Pharmacist 02/29/2020  1:13 PM

## 2020-02-29 NOTE — Progress Notes (Signed)
[facility] Progress Note  Subjective: seen in ICU  Vitals:   02/29/20 0800 02/29/20 0900 02/29/20 1000 02/29/20 1110  BP: 111/60 117/62 (!) 161/69   Pulse: 98 96 100   Resp: (!) 34 (!) 40 (!) 37   Temp:      TempSrc:      SpO2: 100% 100% 100% 100%  Weight:      Height:        Exam:   In ICU on vent, alert, responds by blinking it seems  no jvd  Chest cta bilat ant/ lat  Cor reg no RG  Abd soft ntnd no ascites, +llq ostomy, + peg tube   Ext 1+ hip edema bilat   Neuro: as above     Summary: came [facility] after [place] hospitalization, then had severe infections/ bacteremia/ PNA on [facility] and developed AKI felt due to IV gent toxicity requiring initiation of HD 12/16/19. Admitted to ICU [facility] for trach bleed on 5/19. Was getting MWF HD at [facility] prior to admit.   OP HD: started March 2021 at [facility] as above MWF   Assessment/ Plan: 1. Enterococcus/ MRSE bacteremia - TDC removed 5/26, per ID needs 6 wks vanc/ fortaz/ flagyl for polymicrobial sepsis and osteomyelitis.  2. Sacral osteo/ decub stage IV - per ID, as above 3. ESRD/ AKI - on HD since mid March 2021, d/t gent toxicity. HD MWF at [facility]. SP new TDC yest 6/2 by IR.  Donah Driver w/o sig [place], not sure cause.  Plan HD today then Sat.  4. A/C resp failure/ PNA -  vent issues per CCM, IV abx as above 5. Trach bleed - resolved 6. Tracheobronchitis - sp course of doxycycline 7. AMS - multifactorial, responsive now 8. Atrial fib - per primary 9. Anemia ckd - transfuse prn, Ca 11.0 corrected, phos 3.5- 6 here, use low Ca++ bath , PTH 29.  High Ca++ prob from immobility.  10. 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 SP COVID pna - original problem in Feb at [facility] 02/29/2020, 11:41 AM   Recent Labs  Lab 02/27/20 0316 02/27/20 0705 02/28/20 0234 02/29/20 0230  K 3.9   < > 3.6 3.9  BUN 135*   < > 120* 151*  CREATININE 4.73*    < > 4.22* 4.97*  CALCIUM 10.2   < > 9.7 9.9  PHOS 5.8*  --   --   --   HGB 9.2*   < > 9.1* 8.8*   < > = values in this interval not displayed.   Inpatient medications: . apixaban  2.5 mg Per Tube BID  . chlorhexidine gluconate (MEDLINE KIT)  15 mL Mouth Rinse BID  . Chlorhexidine Gluconate Cloth  6 each Topical Q0600  . diltiazem  30 mg Per Tube Q8H  . docusate  100 mg Per Tube BID  . feeding supplement (PRO-STAT SUGAR FREE 64)  30 mL Per Tube BID  . insulin aspart  0-6 Units Subcutaneous Q4H  . insulin aspart  2 Units Subcutaneous Q4H  . mouth rinse  15 mL Mouth Rinse 10 times per day  . metroNIDAZOLE  500 mg Per Tube Q8H  . nutrition supplement (JUVEN)  1 packet Per Tube BID BM  . pantoprazole (PROTONIX) IV  40 mg Intravenous Q24H  . polyethylene glycol  17 g Per Tube Daily  . vancomycin variable  dose per unstable renal function (pharmacist dosing)   Does not apply See admin instructions   . sodium chloride    . sodium chloride    . sodium chloride 10 mL/hr at 02/29/20 0600  . cefTAZidime (FORTAZ)  IV Stopped (02/28/20 1813)  . feeding supplement (NEPRO CARB STEADY) 45 mL/hr at 02/28/20 1900   sodium chloride, sodium chloride, sodium chloride, acetaminophen (TYLENOL) oral liquid 160 mg/5 mL, alteplase, fentaNYL (SUBLIMAZE) injection, fentaNYL (SUBLIMAZE) injection, heparin, heparin, heparin, lidocaine (PF), lidocaine-prilocaine, midazolam, midazolam, pentafluoroprop-tetrafluoroeth

## 2020-02-29 NOTE — Progress Notes (Signed)
Patients sister called and was updated on patients status throughout the night.

## 2020-02-29 NOTE — Progress Notes (Signed)
NAME:  Shane Banks, MRN:  [id number], DOB:  1946/06/02, LOS: 25 ADMISSION DATE:  02/14/2020, CONSULTATION DATE:  5/19 REFERRING MD:  Dr. Alvino Chapel, CHIEF COMPLAINT:  Hemoptysis   Brief History   74 year old male. COVID ARDS in January. Unfortunately required trach and LTACH placement. Had been tolerating trach collar, but on 5/19 he developed hemoptysis and hypoxemic respiratory failure requiring vent.   History of present illness   74 year old male with past medical history as below, which is significant for Atrial fibrillation and stroke. He was admitted to [facility] for [place] 19 pneumonia, which unfortunately progressed to ARDS.  It seems as though his highest vent settings were 60% FiO2 and 8 of PEEP.  He had very little improvement in his ventilatory and oxygenation use over the course of his hospitalization and ultimately underwent tracheostomy and was [facility] [place] on February 23. His course at [facility] was complicated by ongoing infections including UTI, MDRO pseudomonas pneumonia, and stenotrophomonas bacteremia. As a result of sepsis and shock he developed renal failure requiring dialysis, which he now receives M/W/F. In the morning hours oh 5/19 he developed hemoptysis. He had previously been taken off anticoagulation (for AF) due to anemia with positive hemoccult. Transferred to [facility] ED for evaluation of hemoptysis. Prior to the transfer he had been tolerating 28% ATC.   Past Medical History   has a past medical history of Acute on chronic respiratory failure with hypoxia ([place]), Atrial fibrillation with RVR ([place]), BPH (benign prostatic hyperplasia), COVID-19 virus infection, Pneumonia due to COVID-19 virus, Severe sepsis ([place]), and Stroke ([place]).   Significant Hospital Events   1/8  Admit to [facility] for COVID PNA 2/23 Transfer to [facility] on vent 5/19 Transfer to [facility] for hemoptysis. #6 cuffed trach placed. Back on  vent. Bronch performed 5/20 Transitioned to trach collar.  5/22 back on vent full time 5/24 back on antibiotics for fevers 6/1 Initiating PSV weans  Consults:  ENT nephro ID PCCM  Procedures:  ENT flex scope 5/19 > no bleeding source identified FOB 5/19 > no pulmonary bleeding source identified.   Significant Diagnostic Tests:  CXR 5/28 >> R CVC placement confirmed Slightly improved aeration at the right base. 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 Edema in the muscles around the hips and in the posterior paraspinal musculature of the lower lumbar spine and in the buttocks which could represent myositis. Fluid-fluid level in the bladder may represent protein or debris in the bladder. The possibility of urinary tract infection should be considered.  CXR 6/3> improved R lung aeration, worsening L sided opacity. Tracheostomy tube, HD catheter.    Micro Data:    tracheal aspirate 5/7> for Pseudomonas A  BAL 5/19 > no growth 5/19 blood > NG 5/22 trach aspirate>> candida [place] 5/22 blood>> 1/4 GPC> staph epi 5/25 blood>> GPC (enterococcus on biofire) 2/4 bottles>> E. Faecalis, staph epi. 5/25 trach aspirate>>many PMN, few GPR, rare GPC> diphtheroids 5/25 blood cx>> + for Enterococcus Faecalis (gent resistant)/ Staphylococcus Epidermidis (tetracycline, vanc, rifampin sensitive) 5/27 BCx> ngtd    Antimicrobials:  Levofloxacin 5/24>5/26 vanc 5/25> Ceftriaxone  5/27> Metronidazole 5/27>  Fortaz>  Interim history/subjective:  NAEO  Yesterday, planned to change trach due to suspected cuff leak however when preparing for trach exchange, inspiratory and expiratory volumes  correlated appropriately and decision made to defer trach exchange   Objective   Blood pressure 138/83, pulse 83, temperature 98.4  F (36.9 C), temperature source Axillary, resp. rate (!) 30, height _0  (1.753 m), weight 58.7 kg, SpO2 100 %.    Vent Mode: PSV;CPAP FiO2 (%):  [30 %-100 %] 30 % Set Rate:  [16 bmp] 16 bmp Vt Set:  [480 mL] 480 mL PEEP:  [5 cmH20] 5 cmH20 Pressure Support:  [12 cmH20] 12 cmH20 Plateau Pressure:  [17 cmH20-24 cmH20] 17 cmH20   Intake/Output Summary (Last 24 hours) at 02/29/2020 0907 Last data filed at 02/29/2020 0800 Gross per 24 hour  Intake 1162.35 ml  Output 170 ml  Net 992.35 ml   Filed Weights   02/27/20 0414 02/28/20 0356 02/29/20 0407  Weight: 56.9 kg 58.2 kg 58.7 kg    Examination:   Gen:   Chronically ill appearing frail older adult M. Trach/vent NAD  HEENT:  NCAT  # 6 cuffed trach secure. Trachea midline  Lungs:  Increased RR. Symmetrical chest expansion on PSV/CPAP. L sided rhonchi.  CV:     Tachycardic rate. s1s2   Abd:   Soft flat non-distended. Feeding tube with EN. Ext: No cyanosis or clubbing Skin:    Clean, dry, warm  Neuro:  AAO x3 following commands  Resolved Hospital Problem list     Assessment & Plan:   Acute on chronic hypoxemic respiratory failure requiring tracheostomy, prolonged MV: he has a trach s/p COVID pneumonia earlier this year. His oxygenation had  improved to tolerate 28% ATC, but has been back on the vent for several days. Course since complicated by multiple pneumonias, which is seems he has complete tx for (MDRO pseudomonas treated with inhaled tobramycin).  Has a cuffed trach placed by ENT.  LUL pneumonia- diphtheroids on cx -- abundant corynebacterium striatum  -6/3 CXR with progression of L sided PNA  Possible cuff leak -6/2 volume loss with inspiratory volumes 150cc>expiratory. Planned for trach change with concern for cuff leak despite adequate cuff pressure vs air leak around trach. However, this problem resolved as day went on (possibly positional?) and trach exchange deferred.  Plan - Continue attempts at weaning vent, goal to  progress to TC. -VAP bundle -Cont vanc, flagyl, fortaz  -PRN CXR  -if concern for air leak in trach, consider trach change   Best practice:   Per primary team  Eliseo Gum MSN, AGACNP-BC [place] [phone] If no answer, [phone] 02/29/2020, 9:07 AM

## 2020-02-29 NOTE — Progress Notes (Signed)
Dressing over previous HD catheter site changed due to being loose. Vaseline gauze changed. No new bleeding noted.

## 2020-02-29 NOTE — Progress Notes (Signed)
Vt changed back to original settnig per Dr. Genevive Bi. MD aware of large cuff leak. RT will continue to monitor.

## 2020-02-29 NOTE — Progress Notes (Signed)
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  Patient had recent history of Covid pneumonia and was on ventilator because he progressed to ARDS.  Because of little improvement in the ventilatory and oxygenation, he underwent tracheostomy and was discharged to [facility].  He had recent history of UTI, multidrug-resistant Pseudomonas pneumonia, stenotrophomonas bacteremia.  Because of sepsis and shock, he developed renal failure requiring dialysis.  On 5/19, he developed hemoptysis and was transferred to [facility] ED for evaluation.  PCCM,ID,Nephrology following here.  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  Due to lack of insurance, he cannot go back to [facility].  Social worker assisting on placement.  He is hemodynamically stable for discharge as soon as bed is available.  Assessment & Plan:   Principal Problem:   Enterococcal bacteremia Active Problems:   Acute respiratory failure (HCC)   Hemoptysis   Pressure injury of skin   Protein-calorie malnutrition, severe   Decubitus ulcer of sacral region, stage 4 (HCC)   Fever   Acute on chronic hypoxic respiratory failure: Status post trach due to respiratory failure from Covid pneumonia.  Currently on vent.  PCCM managing vent.  History of multiple episodes of pneumonia from different organisms and was treated for different antibiotics.  Most recently pneumonia has been multidrug-resistant Pseudomonas for which she was treated with Avycaz and mycin nebulizers at [facility].  Possible ongoing aspiration also.  Presented with hemoptysis, aspiration of blood.  Patient has cuffed trach placed by ENT.  No  active bleeding on bronchoscopy.  Continue attempts at weaning vent goal is to progress to trach collar.  If there is any concern for air leak in the trach, PCCM will consider trach change.  Paroxysmal A. fib: Currently rate is controlled.  Anticoagulation was discontinued due to suspected GI bleed.  Currently on low-dose Eliquis.  Sepsis/bacteremia: Enterococcus and Staphylococcus epidermidis bacteremia.  Also has osteomyelitis fifth sacral segment.  Chest x-ray showed persistent bilateral lower lobe infiltrates.  High suspicion for ongoing aspiration.  Currently on vancomycin with hemodialysis.  Infectious disease consulted and following.  MRI pelvis/sacrum showed osteomyelitis of fifth sacral segment, edema in the muscle around both hips and proximal thighs with possible myositis.  Currently on vancomycin, ceftriaxone, Flagyl.  IR consulted for bone biopsy but due to high risk  it was not done. ID recommended treatment for 6 weeks for sacral osteomyelitis and staph epidermidis bacteremia with vancomycin, ceftriaxone 2 g IV daily and metronidazole. He has leukocytosis.  Continue to monitor CBC.  ESRD: On dialysis per nephrology.  Underwent  tunnel catheter placement on 6/2.  Undergoing dialysis as per nephrology.  Chronic normocytic anemia: Secondary to ESRD, critical illness.  Has been transfused with PRBCs.  Currently hemoglobin stable.  Continue to monitor  Unstageable sacral pressure ulcer/osteomyelitis: Wound care following.  Discussion as above.  Cachexia/severe protein malnutrition: On tube feeds.  Nutrition following.  Mild elevated LFTs: Continue to monitor.  Disposition: Patient is from [facility].  Anticipate discharge to SNF but due to lack of insurance, placement has been difficult.  Social working assisting    Nutrition Problem: Severe Malnutrition Etiology: chronic illness(chronic respiratory failure related to COVID ARDS)      DVT prophylaxis: Eliquis Code  Status:  DNR Family Communication: Called sister for update on 02/29/20 Status is: Inpatient  Remains inpatient appropriate because:Unsafe d/c plan   Dispo: The patient is from: SNF              Anticipated d/c is to: SNF              Anticipated d/c date is: > 3 days Patient is medically stable for discharge as soon as bed is available.     Consultants: ID, PCCM, IR, nephrology  Procedures: ENT flex scope 5/19 > no bleeding source identified FOB 5/19 > no pulmonary bleeding source identified. 6/2: Tunnel catheter placement by IR.  Antimicrobials:  Anti-infectives (From admission, onward)   Start     Dose/Rate Route Frequency Ordered Stop   02/28/20 1110  vancomycin (VANCOREADY) IVPB 500 mg/100 mL     over 60 Minutes  Continuous PRN 02/28/20 1112 02/28/20 1110   02/28/20 1101  vancomycin (VANCOCIN) 1-5 GM/200ML-% IVPB  Status:  Discontinued    Note to Pharmacy: Lytle Butte   : cabinet override      02/28/20 1101 02/28/20 1327   02/27/20 1800  cefTAZidime (FORTAZ) 1 g in sodium chloride 0.9 % 100 mL IVPB     1 g 200 mL/hr over 30 Minutes Intravenous Every 24 hours 02/27/20 0947 04/04/20 2359   02/27/20 1200  vancomycin (VANCOCIN) IVPB 500 mg/100 ml premix     500 mg 100 mL/hr over 60 Minutes Intravenous Once 02/27/20 0829 02/27/20 1343   02/24/20 1800  vancomycin (VANCOREADY) IVPB 500 mg/100 mL     500 mg 100 mL/hr over 60 Minutes Intravenous  Once 02/24/20 1308 02/24/20 1905   02/22/20 2200  metroNIDAZOLE (FLAGYL) tablet 500 mg     500 mg Per Tube Every 8 hours 02/22/20 0957 04/04/20 2359   02/22/20 1700  cefTRIAXone (ROCEPHIN) 2 g in sodium chloride 0.9 % 100 mL IVPB  Status:  Discontinued     2 g 200 mL/hr over 30 Minutes Intravenous Every 24 hours 02/22/20 0957 02/27/20 0947   02/22/20 1200  vancomycin (VANCOCIN) IVPB 500 mg/100 ml premix  Status:  Discontinued     500 mg 100 mL/hr over 60 Minutes Intravenous Every T-Th-Sa (Hemodialysis) 02/20/20 1253 02/21/20 1040    02/21/20 1431  vancomycin variable dose per unstable renal function (pharmacist dosing)      Does not apply See admin instructions 02/21/20 1431     02/21/20 1200  vancomycin (VANCOREADY) IVPB 500 mg/100 mL     500 mg 100 mL/hr over 60 Minutes Intravenous  Once 02/21/20 0956 02/21/20 1253   02/21/20 0714  vancomycin (VANCOCIN) 500-5 MG/100ML-% IVPB  Status:  Discontinued    Note to Pharmacy: Ashley Akin   : cabinet override      02/21/20 0714 02/21/20 1346   02/20/20 1400  vancomycin (VANCOREADY) IVPB 1250 mg/250 mL     1,250 mg 166.7 mL/hr over 90 Minutes Intravenous  Once 02/20/20 1253 02/20/20 1538   02/20/20 1200  levofloxacin (LEVAQUIN) IVPB 500 mg  Status:  Discontinued     500 mg 100 mL/hr over 60 Minutes Intravenous Every 48 hours 02/20/20 1020 02/21/20 0943   02/15/20 1545  vancomycin (VANCOCIN) IVPB 1000 mg/200 mL premix  Status:  Discontinued     1,000 mg 200 mL/hr over 60 Minutes Intravenous Every 24 hours 02/14/20 1530 02/14/20 1745   02/15/20 0330  ceFEPIme (MAXIPIME) 2 g in sodium chloride 0.9 % 100 mL IVPB  Status:  Discontinued  2 g 200 mL/hr over 30 Minutes Intravenous Every 12 hours 02/14/20 1531 02/14/20 1531   02/14/20 1531  ceFEPIme (MAXIPIME) 2 g in sodium chloride 0.9 % 100 mL IVPB  Status:  Discontinued     2 g 200 mL/hr over 30 Minutes Intravenous Every 12 hours 02/14/20 1531 02/14/20 1745   02/14/20 1530  ceFEPIme (MAXIPIME) 2 g in sodium chloride 0.9 % 100 mL IVPB  Status:  Discontinued     2 g 200 mL/hr over 30 Minutes Intravenous  Once 02/14/20 1520 02/14/20 1531   02/14/20 1530  vancomycin (VANCOREADY) IVPB 1500 mg/300 mL  Status:  Discontinued     1,500 mg 150 mL/hr over 120 Minutes Intravenous  Once 02/14/20 1530 02/14/20 1745      Subjective: Patient seen and examined at the bedside this morning.  Hemodynamically stable.  No active issues.  Comfortable. Objective: Vitals:   02/29/20 0500 02/29/20 0600 02/29/20 0710 02/29/20 0726  BP: 120/69  138/83    Pulse: 73 83    Resp: (!) 28 (!) 30    Temp:    98.4 F (36.9 C)  TempSrc:    Axillary  SpO2: 99% 100% 100%   Weight:      Height:        Intake/Output Summary (Last 24 hours) at 02/29/2020 0753 Last data filed at 02/29/2020 0600 Gross per 24 hour  Intake 1072.32 ml  Output 170 ml  Net 902.32 ml   Filed Weights   02/27/20 0414 02/28/20 0356 02/29/20 0407  Weight: 56.9 kg 58.2 kg 58.7 kg    Examination:   General exam: Comfortable, not in distress HEENT:PERRL, trach Respiratory system: Bilateral equal air entry, normal vesicular breath sounds, no wheezes or crackles  Cardiovascular system: Afib, No JVD, murmurs, rubs, gallops or clicks. Gastrointestinal system: Abdomen is nondistended, soft and nontender. No organomegaly or masses felt. Normal bowel sounds heard.PEG,colostomy Central nervous system: Alert and awake Extremities: No edema, no clubbing ,no cyanosis Skin: No rashes, no icterus ,no pallor Non unstageable sacral ulcer present on admission    Data Reviewed: I have personally reviewed following labs and imaging studies  CBC: Recent Labs  Lab 02/26/20 0216 02/27/20 0316 02/27/20 0705 02/28/20 0234 02/29/20 0230  WBC 17.9* 16.7* 15.8* 16.2* 17.2*  NEUTROABS  --   --   --   --  11.6*  HGB 9.0* 9.2* 9.1* 9.1* 8.8*  HCT 28.4* 28.9* 27.7* 28.9* 27.9*  MCV 82.1 81.6 81.5 81.9 83.0  PLT 268 303 277 268 480   Basic Metabolic Panel: Recent Labs  Lab 02/25/20 0151 02/26/20 0216 02/27/20 0316 02/28/20 0234 02/29/20 0230  NA 141 140 141 139 139  K 4.5 3.6 3.9 3.6 3.9  CL 98 100 98 98 99  CO2 21* 23 22 22 22   GLUCOSE 154* 166* 131* 144* 134*  BUN 157* 95* 135* 120* 151*  CREATININE 4.88* 3.65* 4.73* 4.22* 4.97*  CALCIUM 10.2 9.8 10.2 9.7 9.9  MG 2.5* 2.1  --   --   --   PHOS  --   --  5.8*  --   --    GFR: Estimated Creatinine Clearance: 11.2 mL/min (A) (by C-G formula based on SCr of 4.97 mg/dL (H)). Liver Function Tests: Recent Labs  Lab  02/25/20 0151 02/27/20 0316 02/28/20 0234  AST 62*  --  39  ALT 85*  --  47*  ALKPHOS 219*  --  233*  BILITOT 0.4  --  0.6  PROT 8.6*  --  8.2*  ALBUMIN 2.5* 2.3* 2.3*   No results for input(s): LIPASE, AMYLASE in the last 168 hours. No results for input(s): AMMONIA in the last 168 hours. Coagulation Profile: No results for input(s): INR, PROTIME in the last 168 hours. Cardiac Enzymes: No results for input(s): CKTOTAL, CKMB, CKMBINDEX, TROPONINI in the last 168 hours. BNP (last 3 results) No results for input(s): PROBNP in the last 8760 hours. HbA1C: No results for input(s): HGBA1C in the last 72 hours. CBG: Recent Labs  Lab 02/28/20 1512 02/28/20 1917 02/28/20 2325 02/29/20 0329 02/29/20 0724  GLUCAP 98 157* 133* 126* 158*   Lipid Profile: No results for input(s): CHOL, HDL, LDLCALC, TRIG, CHOLHDL, LDLDIRECT in the last 72 hours. Thyroid Function Tests: No results for input(s): TSH, T4TOTAL, FREET4, T3FREE, THYROIDAB in the last 72 hours. Anemia Panel: No results for input(s): VITAMINB12, FOLATE, FERRITIN, TIBC, IRON, RETICCTPCT in the last 72 hours. Sepsis Labs: No results for input(s): PROCALCITON, LATICACIDVEN in the last 168 hours.  Recent Results (from the past 240 hour(s))  Culture, respiratory (non-expectorated)     Status: None   Collection Time: 02/20/20 12:38 PM   Specimen: Tracheal Aspirate; Respiratory  Result Value Ref Range Status   Specimen Description TRACHEAL ASPIRATE  Final   Special Requests NONE  Final   Gram Stain   Final    RARE WBC PRESENT, PREDOMINANTLY PMN RARE SQUAMOUS EPITHELIAL CELLS PRESENT FEW GRAM POSITIVE RODS RARE GRAM POSITIVE COCCI Performed at [facility], [place] [address] [place], [place] [address]    Culture   Final    ABUNDANT CORYNEBACTERIUM STRIATUM Standardized susceptibility testing for this organism is not available. FEW CANDIDA PARAPSILOSIS    Report Status 02/24/2020 FINAL  Final  Culture, blood (routine  x 2)     Status: Abnormal   Collection Time: 02/20/20 12:50 PM   Specimen: BLOOD LEFT HAND  Result Value Ref Range Status   Specimen Description BLOOD LEFT HAND  Final   Special Requests   Final    BOTTLES DRAWN AEROBIC AND ANAEROBIC Blood Culture adequate volume   Culture  Setup Time   Final    GRAM POSITIVE COCCI IN CHAINS IN BOTH AEROBIC AND ANAEROBIC BOTTLES CRITICAL RESULT CALLED TO, READ BACK BY AND VERIFIED WITH: Jens Som AMEND [id number] FCP Performed at [facility], [place] [address], [place], [place] [address]    Culture (A)  Final    ENTEROCOCCUS FAECALIS STAPHYLOCOCCUS EPIDERMIDIS    Report Status 02/24/2020 FINAL  Final   Organism ID, Bacteria ENTEROCOCCUS FAECALIS  Final   Organism ID, Bacteria STAPHYLOCOCCUS EPIDERMIDIS  Final      Susceptibility   Enterococcus faecalis - MIC*    AMPICILLIN <=2 SENSITIVE Sensitive     VANCOMYCIN 1 SENSITIVE Sensitive     GENTAMICIN SYNERGY RESISTANT Resistant     * ENTEROCOCCUS FAECALIS   Staphylococcus epidermidis - MIC*    CIPROFLOXACIN >=8 RESISTANT Resistant     ERYTHROMYCIN >=8 RESISTANT Resistant     GENTAMICIN >=16 RESISTANT Resistant     OXACILLIN >=4 RESISTANT Resistant     TETRACYCLINE 2 SENSITIVE Sensitive     VANCOMYCIN 2 SENSITIVE Sensitive     TRIMETH/SULFA 160 RESISTANT Resistant     CLINDAMYCIN >=8 RESISTANT Resistant     RIFAMPIN <=0.5 SENSITIVE Sensitive     Inducible Clindamycin NEGATIVE Sensitive     * STAPHYLOCOCCUS EPIDERMIDIS  Blood Culture ID Panel (Reflexed)     Status: Abnormal   Collection Time:  02/20/20 12:50 PM  Result Value Ref Range Status   Enterococcus species DETECTED (A) NOT DETECTED Final    Comment: CRITICAL RESULT CALLED TO, READ BACK BY AND VERIFIED WITH: PHARMD KAREN AMEND [id number] FCP    Vancomycin resistance NOT DETECTED NOT DETECTED Final   Listeria monocytogenes NOT DETECTED NOT DETECTED Final   Staphylococcus species DETECTED (A) NOT DETECTED Final    Comment:  Methicillin (oxacillin) resistant coagulase negative staphylococcus. Possible blood culture contaminant (unless isolated from more than one blood culture draw or clinical case suggests pathogenicity). No antibiotic treatment is indicated for blood  culture contaminants. CRITICAL RESULT CALLED TO, READ BACK BY AND VERIFIED WITH: PHARMD KAREN AMEND [id number] FCP    Staphylococcus aureus (BCID) NOT DETECTED NOT DETECTED Final   Methicillin resistance DETECTED (A) NOT DETECTED Final    Comment: CRITICAL RESULT CALLED TO, READ BACK BY AND VERIFIED WITH: PHARMD KAREN AMEND [id number] FCP    Streptococcus species NOT DETECTED NOT DETECTED Final   Streptococcus agalactiae NOT DETECTED NOT DETECTED Final   Streptococcus pneumoniae NOT DETECTED NOT DETECTED Final   Streptococcus pyogenes NOT DETECTED NOT DETECTED Final   Acinetobacter baumannii NOT DETECTED NOT DETECTED Final   Enterobacteriaceae species NOT DETECTED NOT DETECTED Final   Enterobacter cloacae complex NOT DETECTED NOT DETECTED Final   Escherichia coli NOT DETECTED NOT DETECTED Final   Klebsiella oxytoca NOT DETECTED NOT DETECTED Final   Klebsiella pneumoniae NOT DETECTED NOT DETECTED Final   Proteus species NOT DETECTED NOT DETECTED Final   Serratia marcescens NOT DETECTED NOT DETECTED Final   Haemophilus influenzae NOT DETECTED NOT DETECTED Final   Neisseria meningitidis NOT DETECTED NOT DETECTED Final   Pseudomonas aeruginosa NOT DETECTED NOT DETECTED Final   Candida albicans NOT DETECTED NOT DETECTED Final   Candida glabrata NOT DETECTED NOT DETECTED Final   Candida krusei NOT DETECTED NOT DETECTED Final   Candida parapsilosis NOT DETECTED NOT DETECTED Final   Candida tropicalis NOT DETECTED NOT DETECTED Final    Comment: Performed at [facility], [place]. [address] [place], [place] [address]  Culture, blood (routine x 2)     Status: None   Collection Time: 02/20/20 12:58 PM   Specimen: BLOOD LEFT ARM  Result Value  Ref Range Status   Specimen Description BLOOD LEFT ARM  Final   Special Requests   Final    BOTTLES DRAWN AEROBIC AND ANAEROBIC Blood Culture adequate volume   Culture   Final    NO GROWTH 5 DAYS Performed at [facility], [place] [address], [place], [place] [address]    Report Status 02/25/2020 FINAL  Final  Culture, blood (Routine X 2) w Reflex to ID Panel     Status: None   Collection Time: 02/22/20 11:25 AM   Specimen: BLOOD RIGHT HAND  Result Value Ref Range Status   Specimen Description BLOOD RIGHT HAND  Final   Special Requests   Final    BOTTLES DRAWN AEROBIC ONLY Blood Culture adequate volume   Culture   Final    NO GROWTH 5 DAYS Performed at [facility], [place] [address], [place], [place] [address]    Report Status 02/27/2020 FINAL  Final  Culture, blood (Routine X 2) w Reflex to ID Panel     Status: None   Collection Time: 02/22/20 11:25 AM   Specimen: BLOOD RIGHT HAND  Result Value Ref Range Status   Specimen Description BLOOD RIGHT HAND  Final  Special Requests   Final    BOTTLES DRAWN AEROBIC ONLY Blood Culture adequate volume   Culture   Final    NO GROWTH 5 DAYS Performed at [facility], [place] [address] [place], [place] [address]    Report Status 02/27/2020 FINAL  Final         Radiology Studies: IR Fluoro Guide CV Line Right  Result Date: 02/28/2020 CLINICAL DATA:  Renal failure. Needs long-term access for planned hemodialysis regimen. EXAM: TUNNELED HEMODIALYSIS CATHETER PLACEMENT WITH ULTRASOUND AND FLUOROSCOPIC GUIDANCE TECHNIQUE: As antibiotic prophylaxis, vancomycin 1 g was ordered pre-procedure and administered intravenously within one hour of incision. Patency of the right IJ vein was confirmed with ultrasound with image documentation. An appropriate skin site was determined. Region was prepped using maximum barrier technique including cap and mask, sterile gown, sterile gloves, large sterile sheet, and Chlorhexidine as cutaneous  antisepsis. The region was infiltrated locally with 1% lidocaine. No additional IV sedation was required. Under real-time ultrasound guidance, the right IJ vein was accessed with a 21 gauge micropuncture needle; the needle tip within the vein was confirmed with ultrasound image documentation. Needle exchanged over the 018 guidewire for transitional dilator, which allowed advancement of a Benson wire into the IVC. Over this, an MPA catheter was advanced. A Palindrome 23 hemodialysis catheter was tunneled from the right anterior chest wall approach to the right IJ dermatotomy site. The MPA catheter was exchanged over an Amplatz wire for serial vascular dilators which allow placement of a peel-away sheath, through which the catheter was advanced under intermittent fluoroscopy, positioned with its tips in the proximal and midright atrium. Spot chest radiograph confirms good catheter position. No pneumothorax. Catheter was flushed and primed per protocol. Catheter secured externally with O Prolene sutures. The right IJ dermatotomy site was closed with Dermabond. COMPLICATIONS: COMPLICATIONS None immediate FLUOROSCOPY TIME:  108 seconds; 4 mGy COMPARISON:  None IMPRESSION: 1. Technically successful placement of tunneled right IJ hemodialysis catheter with ultrasound and fluoroscopic guidance. Ready for routine use. ACCESS: Remains approachable for percutaneous intervention as needed. Electronically Signed   By: Lucrezia Europe M.D.   On: 02/28/2020 13:50   IR US Guide Vasc Access Right  Result Date: 02/28/2020 CLINICAL DATA:  Renal failure. Needs long-term access for planned hemodialysis regimen. EXAM: TUNNELED HEMODIALYSIS CATHETER PLACEMENT WITH ULTRASOUND AND FLUOROSCOPIC GUIDANCE TECHNIQUE: As antibiotic prophylaxis, vancomycin 1 g was ordered pre-procedure and administered intravenously within one hour of incision. Patency of the right IJ vein was confirmed with ultrasound with image documentation. An appropriate skin  site was determined. Region was prepped using maximum barrier technique including cap and mask, sterile gown, sterile gloves, large sterile sheet, and Chlorhexidine as cutaneous antisepsis. The region was infiltrated locally with 1% lidocaine. No additional IV sedation was required. Under real-time ultrasound guidance, the right IJ vein was accessed with a 21 gauge micropuncture needle; the needle tip within the vein was confirmed with ultrasound image documentation. Needle exchanged over the 018 guidewire for transitional dilator, which allowed advancement of a Benson wire into the IVC. Over this, an MPA catheter was advanced. A Palindrome 23 hemodialysis catheter was tunneled from the right anterior chest wall approach to the right IJ dermatotomy site. The MPA catheter was exchanged over an Amplatz wire for serial vascular dilators which allow placement of a peel-away sheath, through which the catheter was advanced under intermittent fluoroscopy, positioned with its tips in the proximal and midright atrium. Spot chest radiograph confirms good catheter position. No pneumothorax.  Catheter was flushed and primed per protocol. Catheter secured externally with O Prolene sutures. The right IJ dermatotomy site was closed with Dermabond. COMPLICATIONS: COMPLICATIONS None immediate FLUOROSCOPY TIME:  108 seconds; 4 mGy COMPARISON:  None IMPRESSION: 1. Technically successful placement of tunneled right IJ hemodialysis catheter with ultrasound and fluoroscopic guidance. Ready for routine use. ACCESS: Remains approachable for percutaneous intervention as needed. Electronically Signed   By: Lucrezia Europe M.D.   On: 02/28/2020 13:50   DG CHEST PORT 1 VIEW  Result Date: 02/27/2020 CLINICAL DATA:  Tachypnea, hypotension, increased trach secretions EXAM: PORTABLE CHEST 1 VIEW COMPARISON:  02/25/2020 FINDINGS: No significant interval change in AP portable radiograph with heterogeneous airspace opacity and pleural effusions, left  greater than right. No new airspace opacity. Tracheostomy remains in position. Right neck multi lumen vascular catheter, tip over the lower SVC. IMPRESSION: No significant interval change in AP portable radiograph with heterogeneous airspace opacity and pleural effusions, left greater than right. No new airspace opacity. Electronically Signed   By: Eddie Candle M.D.   On: 02/27/2020 12:43        Scheduled Meds: . apixaban  2.5 mg Per Tube BID  . chlorhexidine gluconate (MEDLINE KIT)  15 mL Mouth Rinse BID  . Chlorhexidine Gluconate Cloth  6 each Topical Q0600  . diltiazem  30 mg Per Tube Q8H  . docusate  100 mg Per Tube BID  . feeding supplement (PRO-STAT SUGAR FREE 64)  30 mL Per Tube BID  . insulin aspart  0-6 Units Subcutaneous Q4H  . insulin aspart  2 Units Subcutaneous Q4H  . mouth rinse  15 mL Mouth Rinse 10 times per day  . metroNIDAZOLE  500 mg Per Tube Q8H  . nutrition supplement (JUVEN)  1 packet Per Tube BID BM  . pantoprazole (PROTONIX) IV  40 mg Intravenous Q24H  . polyethylene glycol  17 g Per Tube Daily  . vancomycin variable dose per unstable renal function (pharmacist dosing)   Does not apply See admin instructions   Continuous Infusions: . sodium chloride    . sodium chloride    . sodium chloride 10 mL/hr at 02/29/20 0600  . cefTAZidime (FORTAZ)  IV Stopped (02/28/20 1813)  . feeding supplement (NEPRO CARB STEADY) 45 mL/hr at 02/28/20 1900     LOS: 15 days    Time spent: 35 mins.More than 50% of that time was spent in counseling and/or coordination of care.      Shelly Coss, MD [facility] P6/11/2019, 7:53 AM

## 2020-02-29 NOTE — Progress Notes (Signed)
[facility] Progress Note Patient Name: Shane Banks DOB: 1946/08/04 MRN: [id number]   Date of Service  02/29/2020  HPI/Events of Note  Notified of increased work of breathing. Having issues with cuff leak throughout the day. Previous tidal volume set at 480 but currently at 370 with no orders noted. VTE 200s on this setting.  eICU Interventions  Will resume VTI 480. On change VTE 300s with slight decrease work of breathing. May consider increasing further to compensate for leak. Will need trach change in am.     Intervention Category Intermediate Interventions: Respiratory distress - evaluation and management  Judd Lien 02/29/2020, 8:24 PM

## 2020-03-01 ENCOUNTER — Inpatient Hospital Stay (HOSPITAL_COMMUNITY): Payer: Medicare HMO

## 2020-03-01 LAB — BASIC METABOLIC PANEL
Anion gap: 13 (ref 5–15)
BUN: 59 mg/dL — ABNORMAL HIGH (ref 8–23)
CO2: 24 mmol/L (ref 22–32)
Calcium: 9.2 mg/dL (ref 8.9–10.3)
Chloride: 99 mmol/L (ref 98–111)
Creatinine, Ser: 2.47 mg/dL — ABNORMAL HIGH (ref 0.61–1.24)
GFR calc Af Amer: 29 mL/min — ABNORMAL LOW (ref >60)
GFR calc non Af Amer: 25 mL/min — ABNORMAL LOW (ref >60)
Glucose, Bld: 156 mg/dL — ABNORMAL HIGH (ref 70–99)
Potassium: 3.4 mmol/L — ABNORMAL LOW (ref 3.5–5.1)
Sodium: 136 mmol/L (ref 135–145)

## 2020-03-01 LAB — CBC WITH DIFFERENTIAL/PLATELET
Abs Immature Granulocytes: 1.3 10*3/uL — ABNORMAL HIGH (ref 0.00–0.07)
Basophils Absolute: 0.2 10*3/uL — ABNORMAL HIGH (ref 0.0–0.1)
Basophils Relative: 1 %
Eosinophils Absolute: 0.4 10*3/uL (ref 0.0–0.5)
Eosinophils Relative: 2 %
HCT: 29.2 % — ABNORMAL LOW (ref 39.0–52.0)
Hemoglobin: 9.1 g/dL — ABNORMAL LOW (ref 13.0–17.0)
Immature Granulocytes: 6 %
Lymphocytes Relative: 12 %
Lymphs Abs: 2.6 10*3/uL (ref 0.7–4.0)
MCH: 25.8 pg — ABNORMAL LOW (ref 26.0–34.0)
MCHC: 31.2 g/dL (ref 30.0–36.0)
MCV: 82.7 fL (ref 80.0–100.0)
Monocytes Absolute: 1.5 10*3/uL — ABNORMAL HIGH (ref 0.1–1.0)
Monocytes Relative: 7 %
Neutro Abs: 15.4 10*3/uL — ABNORMAL HIGH (ref 1.7–7.7)
Neutrophils Relative %: 72 %
Platelets: 289 10*3/uL (ref 150–400)
RBC: 3.53 MIL/uL — ABNORMAL LOW (ref 4.22–5.81)
RDW: 18.4 % — ABNORMAL HIGH (ref 11.5–15.5)
WBC: 21.5 10*3/uL — ABNORMAL HIGH (ref 4.0–10.5)
nRBC: 0 % (ref 0.0–0.2)

## 2020-03-01 LAB — GLUCOSE, CAPILLARY
Glucose-Capillary: 142 mg/dL — ABNORMAL HIGH (ref 70–99)
Glucose-Capillary: 163 mg/dL — ABNORMAL HIGH (ref 70–99)
Glucose-Capillary: 118 mg/dL — ABNORMAL HIGH (ref 70–99)
Glucose-Capillary: 180 mg/dL — ABNORMAL HIGH (ref 70–99)
Glucose-Capillary: 140 mg/dL — ABNORMAL HIGH (ref 70–99)

## 2020-03-01 MED ORDER — VANCOMYCIN HCL IN DEXTROSE 500-5 MG/100ML-% IV SOLN
500.0000 mg | INTRAVENOUS | Status: AC
  Administered 2020-03-02: 500 mg via INTRAVENOUS
  Filled 2020-03-01: qty 100

## 2020-03-01 MED ORDER — PANTOPRAZOLE SODIUM 40 MG PO PACK
40.0000 mg | PACK | Freq: Every day | ORAL | Status: DC
  Administered 2020-03-01 – 2020-04-24 (×52): 40 mg
  Filled 2020-03-01 (×54): qty 20

## 2020-03-01 MED ORDER — PANTOPRAZOLE SODIUM 40 MG PO TBEC: 40.0000 mg | DELAYED_RELEASE_TABLET | Freq: Every day | ORAL | Status: DC

## 2020-03-01 MED ORDER — VANCOMYCIN HCL IN DEXTROSE 500-5 MG/100ML-% IV SOLN
500.0000 mg | INTRAVENOUS | Status: DC
  Administered 2020-03-04: 500 mg via INTRAVENOUS
  Filled 2020-03-01 (×3): qty 100

## 2020-03-01 MED ORDER — CHLORHEXIDINE GLUCONATE CLOTH 2 % EX PADS
6.0000 | MEDICATED_PAD | Freq: Every day | CUTANEOUS | Status: DC
  Administered 2020-03-01 – 2020-03-03 (×3): 6 via TOPICAL

## 2020-03-01 MED ORDER — POTASSIUM CHLORIDE 20 MEQ/15ML (10%) PO SOLN
20.0000 meq | Freq: Once | ORAL | Status: AC
  Administered 2020-03-01: 20 meq
  Filled 2020-03-01: qty 15

## 2020-03-01 MED ORDER — SODIUM CHLORIDE 0.9 % IV SOLN
2.0000 g | INTRAVENOUS | Status: DC
  Administered 2020-03-04: 2 g via INTRAVENOUS
  Filled 2020-03-01 (×2): qty 2

## 2020-03-01 MED ORDER — DILTIAZEM HCL ER COATED BEADS 120 MG PO CP24
120.0000 mg | ORAL_CAPSULE | Freq: Every day | ORAL | Status: DC
  Filled 2020-03-01: qty 1

## 2020-03-01 MED ORDER — POTASSIUM CHLORIDE CRYS ER 20 MEQ PO TBCR: 20.0000 meq | EXTENDED_RELEASE_TABLET | Freq: Once | ORAL | Status: DC

## 2020-03-01 NOTE — Progress Notes (Signed)
Pharmacy Antibiotic Note  Shane Banks is a 74 y.o. 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 Line pulled 5/26, line holiday and had it replaced 5/28. TTE negative. Repeat BCx are negative. ID planning 6 weeks of therapy  Patient was not scheduled for HD on 6/2 but received vancomycin 1g IV dose per IR physician. Subsequent vancomycin random level was supratherapeutic at 36 mcg/mL. Calculated vancomycin level after HD session on 6/3 was ~20 mcg/mL.  Per renal, patient to be dialyzed today 6/4 and Sat 6/5, then transition back to MWF schedule.  Plan: Vanc 500mg  IV today post HD, again post HD on 6/5, then qHD MWF Fortaz 1gm IV Q24H through 6/6, then 2gm IV qHD MWF starting 6/7 Flagyl 500mg  PT Q8H per MD Monitor HD schedule/tolerance, vanc level PRN Abx end date - 04/04/20 per ID   Height: 5\' 9"  (175.3 cm) Weight: 59.5 kg (131 lb 2.8 oz) IBW/kg (Calculated) : 70.7  Temp (24hrs), Avg:98.2 F (36.8 C), Min:97.6 F (36.4 C), Max:99.1 F (37.3 C)  Recent Labs  Lab 02/26/20 0216 02/26/20 0216 02/27/20 0316 02/27/20 0705 02/28/20 0234 02/29/20 0230 03/01/20 0355  WBC 17.9*   < > 16.7* 15.8* 16.2* 17.2* 21.5*  CREATININE 3.65*  --  4.73*  --  4.22* 4.97* 2.47*  VANCORANDOM 21  --   --   --   --  36  --    < > = values in this interval not displayed.    Estimated Creatinine Clearance: 22.8 mL/min (A) (by C-G formula based on SCr of 2.47 mg/dL (H)).    Allergies  Allergen Reactions  . Aspirin Rash  . Cephalexin Other (See Comments)    dizziness  . Penicillins     Itching,swelling     5/19 Cefepime>>5/19 5/19 Vanc>>5/19; 5/25>>7/8 5/27 CTX >> 6/1 5/27 flagyl >>7/8 6/1 ceftaz >>7/8  5/31 VR = 21 mcg/mL 6/3 VR = 36 (1gm in IR w/o HD) >> estimated 20 mcg/mL post HD >> hold  Adeliz Tonkinson D. Mina Marble, PharmD, BCPS, [place] 03/01/2020, 11:28 AM

## 2020-03-01 NOTE — Procedures (Signed)
Tracheostomy tube change:   The old #6 shiley trach was carefully removed. the tracheostomy site appeared: well healed. A new #6 XLT distal shiley  trach was placed in the tracheostomy stoma using firm pressure. Then secured with velcro trach ties. The tracheostomy was patent, good color change observed via EZ-CAP, and the patient tolerated the procedure well with no immediate complications.   Marshell Garfinkel MD [facility] Please see [email or web address] for pager details.  03/01/2020, 3:15 PM

## 2020-03-01 NOTE — Progress Notes (Signed)
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  Patient had recent history of Covid pneumonia and was on ventilator because he progressed to ARDS.  Because of little improvement in the ventilatory and oxygenation, he underwent tracheostomy and was discharged to [facility].  He had recent history of UTI, multidrug-resistant Pseudomonas pneumonia, stenotrophomonas bacteremia.  Because of sepsis and shock, he developed renal failure requiring dialysis.  On 5/19, he developed hemoptysis and was transferred to [facility] ED for evaluation.  PCCM,ID,Nephrology following here.  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  Due to lack of insurance, he cannot go back to [facility].  Social worker assisting on placement.   Plan for trach change by PCCM due to persistent leak.  Assessment & Plan:   Principal Problem:   Enterococcal bacteremia Active Problems:   Acute respiratory failure (HCC)   Hemoptysis   Pressure injury of skin   Protein-calorie malnutrition, severe   Decubitus ulcer of sacral region, stage 4 (HCC)   Fever   Acute on chronic hypoxic respiratory failure: Status post trach due to respiratory failure from Covid pneumonia.  Currently on vent.  PCCM managing vent.  History of multiple episodes of pneumonia from different organisms and was treated for different antibiotics.  Most recently pneumonia has been multidrug-resistant Pseudomonas for which she was treated with Avycaz and mycin nebulizers at [facility].  Possible ongoing aspiration also.  Presented with hemoptysis, aspiration of blood.  Patient has cuffed trach placed by ENT.  No active bleeding  on bronchoscopy.  Continue attempts at weaning vent goal is to progress to trach collar.  There is concern for air leak in the trach, PCCM will consider trach change.  Paroxysmal A. fib: Currently rate is controlled.  Anticoagulation was discontinued due to suspected GI bleed.  Currently on low-dose Eliquis.  On rate control with Cardizem.  Sepsis/bacteremia: Enterococcus and Staphylococcus epidermidis bacteremia.  Also has osteomyelitis fifth sacral segment.  Chest x-ray showed persistent bilateral lower lobe infiltrates.  High suspicion for ongoing aspiration.  Currently on vancomycin with hemodialysis.  Infectious disease consulted and following.  MRI pelvis/sacrum showed osteomyelitis of fifth sacral segment, edema in the muscle around both hips and proximal thighs with possible myositis.  Currently on vancomycin, ceftriaxone, Flagyl.  IR consulted for bone biopsy but due to high risk  it was not done. ID recommended treatment for 6 weeks for sacral osteomyelitis and staph epidermidis bacteremia with vancomycin, ceftriaxone 2 g IV daily and metronidazole. He has leukocytosis.  Continue to monitor CBC.  ESRD: On dialysis per nephrology.  Underwent  tunnel catheter placement on 6/2.  Undergoing dialysis as per nephrology.  Chronic normocytic anemia: Secondary to ESRD, critical illness.  Has been transfused with PRBCs.  Currently hemoglobin stable.  Continue to monitor  Unstageable sacral pressure ulcer/osteomyelitis: Wound care following.  Discussion as above.  Cachexia/severe protein malnutrition: On tube feeds.  Nutrition following.  Mild elevated LFTs: Continue to monitor.  Disposition: Patient is from [place].  Anticipate discharge to SNF but due to lack of insurance, placement has been difficult.  Social working assisting    Nutrition Problem: Severe Malnutrition Etiology: chronic illness(chronic respiratory failure related to COVID ARDS)      DVT  prophylaxis: Eliquis Code Status:  DNR Family Communication: Called sister for update on 02/29/20 Status is: Inpatient  Remains inpatient appropriate because:Unsafe d/c plan   Dispo: The patient is from: SNF              Anticipated d/c is to: SNF              Anticipated d/c date is: > 3 days   Consultants: ID, PCCM, IR, nephrology  Procedures: ENT flex scope 5/19 > no bleeding source identified FOB 5/19 > no pulmonary bleeding source identified. 6/2: Tunnel catheter placement by IR.  Antimicrobials:  Anti-infectives (From admission, onward)   Start     Dose/Rate Route Frequency Ordered Stop   02/28/20 1110  vancomycin (VANCOREADY) IVPB 500 mg/100 mL     over 60 Minutes  Continuous PRN 02/28/20 1112 02/28/20 1110   02/28/20 1101  vancomycin (VANCOCIN) 1-5 GM/200ML-% IVPB  Status:  Discontinued    Note to Pharmacy: Lytle Butte   : cabinet override      02/28/20 1101 02/28/20 1327   02/27/20 1800  cefTAZidime (FORTAZ) 1 g in sodium chloride 0.9 % 100 mL IVPB     1 g 200 mL/hr over 30 Minutes Intravenous Every 24 hours 02/27/20 0947 04/04/20 2359   02/27/20 1200  vancomycin (VANCOCIN) IVPB 500 mg/100 ml premix     500 mg 100 mL/hr over 60 Minutes Intravenous Once 02/27/20 0829 02/27/20 1343   02/24/20 1800  vancomycin (VANCOREADY) IVPB 500 mg/100 mL     500 mg 100 mL/hr over 60 Minutes Intravenous  Once 02/24/20 1308 02/24/20 1905   02/22/20 2200  metroNIDAZOLE (FLAGYL) tablet 500 mg     500 mg Per Tube Every 8 hours 02/22/20 0957 04/04/20 2359   02/22/20 1700  cefTRIAXone (ROCEPHIN) 2 g in sodium chloride 0.9 % 100 mL IVPB  Status:  Discontinued     2 g 200 mL/hr over 30 Minutes Intravenous Every 24 hours 02/22/20 0957 02/27/20 0947   02/22/20 1200  vancomycin (VANCOCIN) IVPB 500 mg/100 ml premix  Status:  Discontinued     500 mg 100 mL/hr over 60 Minutes Intravenous Every T-Th-Sa (Hemodialysis) 02/20/20 1253 02/21/20 1040   02/21/20 1431  vancomycin variable dose per unstable renal function (pharmacist  dosing)      Does not apply See admin instructions 02/21/20 1431     02/21/20 1200  vancomycin (VANCOREADY) IVPB 500 mg/100 mL     500 mg 100 mL/hr over 60 Minutes Intravenous  Once 02/21/20 0956 02/21/20 1253   02/21/20 0714  vancomycin (VANCOCIN) 500-5 MG/100ML-% IVPB  Status:  Discontinued    Note to Pharmacy: Ashley Akin   : cabinet override      02/21/20 0714 02/21/20 1346   02/20/20 1400  vancomycin (VANCOREADY) IVPB 1250 mg/250 mL     1,250 mg 166.7 mL/hr over 90 Minutes Intravenous  Once 02/20/20 1253 02/20/20 1538   02/20/20 1200  levofloxacin (LEVAQUIN) IVPB 500 mg  Status:  Discontinued     500 mg 100 mL/hr over 60 Minutes Intravenous Every 48 hours 02/20/20 1020 02/21/20 0943   02/15/20 1545  vancomycin (VANCOCIN) IVPB 1000 mg/200 mL premix  Status:  Discontinued     1,000 mg 200 mL/hr over 60 Minutes Intravenous Every 24 hours 02/14/20 1530 02/14/20 1745   02/15/20 0330  ceFEPIme (MAXIPIME) 2 g in sodium chloride 0.9 % 100 mL IVPB  Status:  Discontinued     2 g 200 mL/hr over 30 Minutes  Intravenous Every 12 hours 02/14/20 1531 02/14/20 1531   02/14/20 1531  ceFEPIme (MAXIPIME) 2 g in sodium chloride 0.9 % 100 mL IVPB  Status:  Discontinued     2 g 200 mL/hr over 30 Minutes Intravenous Every 12 hours 02/14/20 1531 02/14/20 1745   02/14/20 1530  ceFEPIme (MAXIPIME) 2 g in sodium chloride 0.9 % 100 mL IVPB  Status:  Discontinued     2 g 200 mL/hr over 30 Minutes Intravenous  Once 02/14/20 1520 02/14/20 1531   02/14/20 1530  vancomycin (VANCOREADY) IVPB 1500 mg/300 mL  Status:  Discontinued     1,500 mg 150 mL/hr over 120 Minutes Intravenous  Once 02/14/20 1530 02/14/20 1745      Subjective:  Patient seen and examined the bedside this morning.  Hemodynamically stable.  There was concern for air leak through the trach last night.  He was slightly hyperventilating this morning.  Not in distress.   Objective: Vitals:   03/01/20 0400 03/01/20 0500 03/01/20 0600 03/01/20  0700  BP: 106/70 103/60 99/68   Pulse: (!) 105 (!) 105 (!) 104   Resp: (!) 22 (!) 24 (!) 30   Temp:    98.4 F (36.9 C)  TempSrc:    Oral  SpO2: 100% 100% 100%   Weight:  59.5 kg    Height:        Intake/Output Summary (Last 24 hours) at 03/01/2020 0744 Last data filed at 03/01/2020 0500 Gross per 24 hour  Intake 1454.98 ml  Output 1385 ml  Net 69.98 ml   Filed Weights   02/29/20 1315 02/29/20 1727 03/01/20 0500  Weight: 62 kg 60.4 kg 59.5 kg    Examination:   General exam: Not in distress HEENT: Trach Respiratory system: Bilateral equal air entry, normal vesicular breath sounds, no wheezes or crackles  Cardiovascular system: Afib. No JVD, murmurs, rubs, gallops or clicks. Gastrointestinal system: Abdomen is nondistended, soft and nontender. No organomegaly or masses felt. Normal bowel sounds heard.  PEG, colostomy Central nervous system: Alert and awake Extremities: No edema, no clubbing ,no cyanosis Skin: No rashes, lesions.  Non unstageable sacral ulcer present on admission.   Data Reviewed: I have personally reviewed following labs and imaging studies  CBC: Recent Labs  Lab 02/27/20 0316 02/27/20 0705 02/28/20 0234 02/29/20 0230 03/01/20 0355  WBC 16.7* 15.8* 16.2* 17.2* 21.5*  NEUTROABS  --   --   --  11.6* 15.4*  HGB 9.2* 9.1* 9.1* 8.8* 9.1*  HCT 28.9* 27.7* 28.9* 27.9* 29.2*  MCV 81.6 81.5 81.9 83.0 82.7  PLT 303 277 268 254 528   Basic Metabolic Panel: Recent Labs  Lab 02/25/20 0151 02/25/20 0151 02/26/20 0216 02/27/20 0316 02/28/20 0234 02/29/20 0230 03/01/20 0355  NA 141   < > 140 141 139 139 136  K 4.5   < > 3.6 3.9 3.6 3.9 3.4*  CL 98   < > 100 98 98 99 99  CO2 21*   < > 23 22 22 22 24   GLUCOSE 154*   < > 166* 131* 144* 134* 156*  BUN 157*   < > 95* 135* 120* 151* 59*  CREATININE 4.88*   < > 3.65* 4.73* 4.22* 4.97* 2.47*  CALCIUM 10.2   < > 9.8 10.2 9.7 9.9 9.2  MG 2.5*  --  2.1  --   --   --   --   PHOS  --   --   --  5.8*  --   --    --    < > =  values in this interval not displayed.   GFR: Estimated Creatinine Clearance: 22.8 mL/min (A) (by C-G formula based on SCr of 2.47 mg/dL (H)). Liver Function Tests: Recent Labs  Lab 02/25/20 0151 02/27/20 0316 02/28/20 0234  AST 62*  --  39  ALT 85*  --  47*  ALKPHOS 219*  --  233*  BILITOT 0.4  --  0.6  PROT 8.6*  --  8.2*  ALBUMIN 2.5* 2.3* 2.3*   No results for input(s): LIPASE, AMYLASE in the last 168 hours. No results for input(s): AMMONIA in the last 168 hours. Coagulation Profile: No results for input(s): INR, PROTIME in the last 168 hours. Cardiac Enzymes: No results for input(s): CKTOTAL, CKMB, CKMBINDEX, TROPONINI in the last 168 hours. BNP (last 3 results) No results for input(s): PROBNP in the last 8760 hours. HbA1C: No results for input(s): HGBA1C in the last 72 hours. CBG: Recent Labs  Lab 02/29/20 1516 02/29/20 1915 02/29/20 2315 03/01/20 0317 03/01/20 0729  GLUCAP 145* 166* 145* 142* 163*   Lipid Profile: No results for input(s): CHOL, HDL, LDLCALC, TRIG, CHOLHDL, LDLDIRECT in the last 72 hours. Thyroid Function Tests: No results for input(s): TSH, T4TOTAL, FREET4, T3FREE, THYROIDAB in the last 72 hours. Anemia Panel: No results for input(s): VITAMINB12, FOLATE, FERRITIN, TIBC, IRON, RETICCTPCT in the last 72 hours. Sepsis Labs: No results for input(s): PROCALCITON, LATICACIDVEN in the last 168 hours.  Recent Results (from the past 240 hour(s))  Culture, respiratory (non-expectorated)     Status: None   Collection Time: 02/20/20 12:38 PM   Specimen: Tracheal Aspirate; Respiratory  Result Value Ref Range Status   Specimen Description TRACHEAL ASPIRATE  Final   Special Requests NONE  Final   Gram Stain   Final    RARE WBC PRESENT, PREDOMINANTLY PMN RARE SQUAMOUS EPITHELIAL CELLS PRESENT FEW GRAM POSITIVE RODS RARE GRAM POSITIVE COCCI Performed at [facility], [place] [address] [place], [place] [address]    Culture   Final     ABUNDANT CORYNEBACTERIUM STRIATUM Standardized susceptibility testing for this organism is not available. FEW CANDIDA PARAPSILOSIS    Report Status 02/24/2020 FINAL  Final  Culture, blood (routine x 2)     Status: Abnormal   Collection Time: 02/20/20 12:50 PM   Specimen: BLOOD LEFT HAND  Result Value Ref Range Status   Specimen Description BLOOD LEFT HAND  Final   Special Requests   Final    BOTTLES DRAWN AEROBIC AND ANAEROBIC Blood Culture adequate volume   Culture  Setup Time   Final    GRAM POSITIVE COCCI IN CHAINS IN BOTH AEROBIC AND ANAEROBIC BOTTLES CRITICAL RESULT CALLED TO, READ BACK BY AND VERIFIED WITH: Jens Som AMEND [id number] FCP Performed at [facility], [place] [address] [place], [place] [address]    Culture (A)  Final    ENTEROCOCCUS FAECALIS STAPHYLOCOCCUS EPIDERMIDIS    Report Status 02/24/2020 FINAL  Final   Organism ID, Bacteria ENTEROCOCCUS FAECALIS  Final   Organism ID, Bacteria STAPHYLOCOCCUS EPIDERMIDIS  Final      Susceptibility   Enterococcus faecalis - MIC*    AMPICILLIN <=2 SENSITIVE Sensitive     VANCOMYCIN 1 SENSITIVE Sensitive     GENTAMICIN SYNERGY RESISTANT Resistant     * ENTEROCOCCUS FAECALIS   Staphylococcus epidermidis - MIC*    CIPROFLOXACIN >=8 RESISTANT Resistant     ERYTHROMYCIN >=8 RESISTANT Resistant     GENTAMICIN >=16 RESISTANT Resistant     OXACILLIN >=4 RESISTANT  Resistant     TETRACYCLINE 2 SENSITIVE Sensitive     VANCOMYCIN 2 SENSITIVE Sensitive     TRIMETH/SULFA 160 RESISTANT Resistant     CLINDAMYCIN >=8 RESISTANT Resistant     RIFAMPIN <=0.5 SENSITIVE Sensitive     Inducible Clindamycin NEGATIVE Sensitive     * STAPHYLOCOCCUS EPIDERMIDIS  Blood Culture ID Panel (Reflexed)     Status: Abnormal   Collection Time: 02/20/20 12:50 PM  Result Value Ref Range Status   Enterococcus species DETECTED (A) NOT DETECTED Final    Comment: CRITICAL RESULT CALLED TO, READ BACK BY AND VERIFIED WITH: PHARMD KAREN AMEND [id number]  [id number] FCP    Vancomycin resistance NOT DETECTED NOT DETECTED Final   Listeria monocytogenes NOT DETECTED NOT DETECTED Final   Staphylococcus species DETECTED (A) NOT DETECTED Final    Comment: Methicillin (oxacillin) resistant coagulase negative staphylococcus. Possible blood culture contaminant (unless isolated from more than one blood culture draw or clinical case suggests pathogenicity). No antibiotic treatment is indicated for blood  culture contaminants. CRITICAL RESULT CALLED TO, READ BACK BY AND VERIFIED WITH: PHARMD KAREN AMEND [id number] FCP    Staphylococcus aureus (BCID) NOT DETECTED NOT DETECTED Final   Methicillin resistance DETECTED (A) NOT DETECTED Final    Comment: CRITICAL RESULT CALLED TO, READ BACK BY AND VERIFIED WITH: PHARMD KAREN AMEND [id number] FCP    Streptococcus species NOT DETECTED NOT DETECTED Final   Streptococcus agalactiae NOT DETECTED NOT DETECTED Final   Streptococcus pneumoniae NOT DETECTED NOT DETECTED Final   Streptococcus pyogenes NOT DETECTED NOT DETECTED Final   Acinetobacter baumannii NOT DETECTED NOT DETECTED Final   Enterobacteriaceae species NOT DETECTED NOT DETECTED Final   Enterobacter cloacae complex NOT DETECTED NOT DETECTED Final   Escherichia coli NOT DETECTED NOT DETECTED Final   Klebsiella oxytoca NOT DETECTED NOT DETECTED Final   Klebsiella pneumoniae NOT DETECTED NOT DETECTED Final   Proteus species NOT DETECTED NOT DETECTED Final   Serratia marcescens NOT DETECTED NOT DETECTED Final   Haemophilus influenzae NOT DETECTED NOT DETECTED Final   Neisseria meningitidis NOT DETECTED NOT DETECTED Final   Pseudomonas aeruginosa NOT DETECTED NOT DETECTED Final   Candida albicans NOT DETECTED NOT DETECTED Final   Candida glabrata NOT DETECTED NOT DETECTED Final   Candida krusei NOT DETECTED NOT DETECTED Final   Candida parapsilosis NOT DETECTED NOT DETECTED Final   Candida tropicalis NOT DETECTED NOT DETECTED Final    Comment:  Performed at [facility], [place]. [address] [place], [place] [address]  Culture, blood (routine x 2)     Status: None   Collection Time: 02/20/20 12:58 PM   Specimen: BLOOD LEFT ARM  Result Value Ref Range Status   Specimen Description BLOOD LEFT ARM  Final   Special Requests   Final    BOTTLES DRAWN AEROBIC AND ANAEROBIC Blood Culture adequate volume   Culture   Final    NO GROWTH 5 DAYS Performed at [facility], [place] [address], [place], [place] [address]    Report Status 02/25/2020 FINAL  Final  Culture, blood (Routine X 2) w Reflex to ID Panel     Status: None   Collection Time: 02/22/20 11:25 AM   Specimen: BLOOD RIGHT HAND  Result Value Ref Range Status   Specimen Description BLOOD RIGHT HAND  Final   Special Requests   Final    BOTTLES DRAWN AEROBIC ONLY Blood Culture adequate volume   Culture   Final    NO  GROWTH 5 DAYS Performed at [facility], [place] [address] [place], [place] [address]    Report Status 02/27/2020 FINAL  Final  Culture, blood (Routine X 2) w Reflex to ID Panel     Status: None   Collection Time: 02/22/20 11:25 AM   Specimen: BLOOD RIGHT HAND  Result Value Ref Range Status   Specimen Description BLOOD RIGHT HAND  Final   Special Requests   Final    BOTTLES DRAWN AEROBIC ONLY Blood Culture adequate volume   Culture   Final    NO GROWTH 5 DAYS Performed at [facility], [place] [address] [place], [place] [address]    Report Status 02/27/2020 FINAL  Final         Radiology Studies: IR Fluoro Guide CV Line Right  Result Date: 02/28/2020 CLINICAL DATA:  Renal failure. Needs long-term access for planned hemodialysis regimen. EXAM: TUNNELED HEMODIALYSIS CATHETER PLACEMENT WITH ULTRASOUND AND FLUOROSCOPIC GUIDANCE TECHNIQUE: As antibiotic prophylaxis, vancomycin 1 g was ordered pre-procedure and administered intravenously within one hour of incision. Patency of the right IJ vein was confirmed with ultrasound with image documentation.  An appropriate skin site was determined. Region was prepped using maximum barrier technique including cap and mask, sterile gown, sterile gloves, large sterile sheet, and Chlorhexidine as cutaneous antisepsis. The region was infiltrated locally with 1% lidocaine. No additional IV sedation was required. Under real-time ultrasound guidance, the right IJ vein was accessed with a 21 gauge micropuncture needle; the needle tip within the vein was confirmed with ultrasound image documentation. Needle exchanged over the 018 guidewire for transitional dilator, which allowed advancement of a Benson wire into the IVC. Over this, an MPA catheter was advanced. A Palindrome 23 hemodialysis catheter was tunneled from the right anterior chest wall approach to the right IJ dermatotomy site. The MPA catheter was exchanged over an Amplatz wire for serial vascular dilators which allow placement of a peel-away sheath, through which the catheter was advanced under intermittent fluoroscopy, positioned with its tips in the proximal and midright atrium. Spot chest radiograph confirms good catheter position. No pneumothorax. Catheter was flushed and primed per protocol. Catheter secured externally with O Prolene sutures. The right IJ dermatotomy site was closed with Dermabond. COMPLICATIONS: COMPLICATIONS None immediate FLUOROSCOPY TIME:  108 seconds; 4 mGy COMPARISON:  None IMPRESSION: 1. Technically successful placement of tunneled right IJ hemodialysis catheter with ultrasound and fluoroscopic guidance. Ready for routine use. ACCESS: Remains approachable for percutaneous intervention as needed. Electronically Signed   By: Lucrezia Europe M.D.   On: 02/28/2020 13:50   IR US Guide Vasc Access Right  Result Date: 02/28/2020 CLINICAL DATA:  Renal failure. Needs long-term access for planned hemodialysis regimen. EXAM: TUNNELED HEMODIALYSIS CATHETER PLACEMENT WITH ULTRASOUND AND FLUOROSCOPIC GUIDANCE TECHNIQUE: As antibiotic prophylaxis,  vancomycin 1 g was ordered pre-procedure and administered intravenously within one hour of incision. Patency of the right IJ vein was confirmed with ultrasound with image documentation. An appropriate skin site was determined. Region was prepped using maximum barrier technique including cap and mask, sterile gown, sterile gloves, large sterile sheet, and Chlorhexidine as cutaneous antisepsis. The region was infiltrated locally with 1% lidocaine. No additional IV sedation was required. Under real-time ultrasound guidance, the right IJ vein was accessed with a 21 gauge micropuncture needle; the needle tip within the vein was confirmed with ultrasound image documentation. Needle exchanged over the 018 guidewire for transitional dilator, which allowed advancement of a Benson wire into the IVC. Over this, an  MPA catheter was advanced. A Palindrome 23 hemodialysis catheter was tunneled from the right anterior chest wall approach to the right IJ dermatotomy site. The MPA catheter was exchanged over an Amplatz wire for serial vascular dilators which allow placement of a peel-away sheath, through which the catheter was advanced under intermittent fluoroscopy, positioned with its tips in the proximal and midright atrium. Spot chest radiograph confirms good catheter position. No pneumothorax. Catheter was flushed and primed per protocol. Catheter secured externally with O Prolene sutures. The right IJ dermatotomy site was closed with Dermabond. COMPLICATIONS: COMPLICATIONS None immediate FLUOROSCOPY TIME:  108 seconds; 4 mGy COMPARISON:  None IMPRESSION: 1. Technically successful placement of tunneled right IJ hemodialysis catheter with ultrasound and fluoroscopic guidance. Ready for routine use. ACCESS: Remains approachable for percutaneous intervention as needed. Electronically Signed   By: Lucrezia Europe M.D.   On: 02/28/2020 13:50   DG CHEST PORT 1 VIEW  Result Date: 02/29/2020 CLINICAL DATA:  Respiratory failure EXAM:  PORTABLE CHEST 1 VIEW COMPARISON:  12/22/2019 FINDINGS: Improvement of right-sided aeration but progressive and extensive opacity on the left. Cardiomegaly. There is a tracheostomy tube and dialysis catheter, tips at the right atrium and unchanged. No visible pneumothorax IMPRESSION: Improved right and worsened left airspace disease. Electronically Signed   By: Monte Fantasia M.D.   On: 02/29/2020 08:07        Scheduled Meds: . apixaban  2.5 mg Per Tube BID  . chlorhexidine gluconate (MEDLINE KIT)  15 mL Mouth Rinse BID  . Chlorhexidine Gluconate Cloth  6 each Topical Q0600  . diltiazem  30 mg Per Tube Q8H  . docusate  100 mg Per Tube BID  . feeding supplement (PRO-STAT SUGAR FREE 64)  30 mL Per Tube BID  . insulin aspart  0-6 Units Subcutaneous Q4H  . insulin aspart  2 Units Subcutaneous Q4H  . levothyroxine  25 mcg Per Tube Q0600  . mouth rinse  15 mL Mouth Rinse 10 times per day  . metroNIDAZOLE  500 mg Per Tube Q8H  . nutrition supplement (JUVEN)  1 packet Per Tube BID BM  . pantoprazole (PROTONIX) IV  40 mg Intravenous Q24H  . polyethylene glycol  17 g Per Tube Daily  . vancomycin variable dose per unstable renal function (pharmacist dosing)   Does not apply See admin instructions   Continuous Infusions: . sodium chloride    . sodium chloride    . sodium chloride 10 mL/hr at 03/01/20 0500  . cefTAZidime (FORTAZ)  IV Stopped (02/29/20 2007)  . feeding supplement (NEPRO CARB STEADY) 1,000 mL (02/29/20 1716)     LOS: 16 days    Time spent: 35 mins.More than 50% of that time was spent in counseling and/or coordination of care.      Shelly Coss, MD [facility] P6/12/2019, 7:44 AM

## 2020-03-01 NOTE — Procedures (Signed)
Tracheostomy Change Note  Patient Details:   Name: Shane Banks DOB: Jun 21, 1946 MRN: [id number]    Airway Documentation:     Evaluation  O2 sats: stable throughout Complications: No apparent complications Patient did tolerate procedure well. Bilateral Breath Sounds: Rhonchi   Assisted Dr. Vaughan Browner with a trach exchange from a #6 Shiley cuffed to a #6 Shiley Distal cuffed trach.  [name] 03/01/2020, 1:47 PM

## 2020-03-01 NOTE — Progress Notes (Signed)
[facility] Progress Note  Subjective: net I/O -300 cc yest. 525cc liquid stool, 900 cc UF on HD.    Wt down slightly at 59.5kg (admit 62.8kg, range 56.9- 64.7kg here)  Vitals:   03/01/20 0800 03/01/20 0812 03/01/20 0821 03/01/20 0900  BP: 109/76 109/76 (!) 98/46 109/74  Pulse: (!) 103 (!) 106 95 (!) 103  Resp: (!) 27 (!) 35 19 (!) 22  Temp:      TempSrc:      SpO2: 100% 100% 97% 100%  Weight:      Height:        Exam:   In ICU on vent, awake  no jvd  Chest cta bilat ant/ lat  Cor reg no RG  Abd soft ntnd no ascites, +llq ostomy, + peg tube   Ext 1+ hip edema bilat   Neuro: as above     Summary: came from [facility] to [facility] after COVID hospitalization which included mech ventilation and trach placement. At [facility] had severe infections/ bacteremia/ PNA and developed AKI felt due to IV gent toxicity requiring initiation of HD 12/16/19. Admitted to ICU [facility] for trach bleed on 5/19. Was getting MWF HD at [facility] prior to admit here.   OP HD: started March 2021 at [facility] as above MWF   Getting HD here 3.5- 4h, UF 0.5- 1.5 L, hep 2000 prn   admit 62.8kg > range 56.9- 64.7kg here  Assessment/ Plan: 1. Enterococcus/ MRSE bacteremia - TDC removed 5/26, per ID needs 6 wks vanc/ fortaz/ flagyl for polymicrobial sepsis and osteomyelitis.  2. Sacral osteo/ decub stage IV - per ID, as above 3. ESRD/ AKI - on HD since mid March 2021, d/t gent toxicity. HD MWF at [facility] SP new TDC yest 6/2 by IR.  Donah Driver w/o sig W[employer] Flank, not sure cause.  Plan HD today then Sat then resume MWF.  4. A/C resp failure/ PNA -  vent issues per CCM, IV abx as above 5. Trach bleed - resolved 6. Tracheobronchitis - sp course of doxycycline 7. AMS - resolved, responsive now 8. Atrial fib - per primary 9. Anemia ckd - transfuse prn, high Ca++ (w/ low pth) prob from immobility, corrCa 11.0 > 10.5 today, improved. Cont low 2.0 Ca++ bath , PTH 29 10. 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 SP COVID pna - original problem in Feb at [employer] 03/01/2020, 10:29 AM   Recent Labs  Lab 02/27/20 0316 02/27/20 0705 02/29/20 0230 03/01/20 0355  K 3.9   < > 3.9 3.4*  BUN 135*   < > 151* 59*  CREATININE 4.73*   < > 4.97* 2.47*  CALCIUM 10.2   < > 9.9 9.2  PHOS 5.8*  --   --   --   HGB 9.2*   < > 8.8* 9.1*   < > = values in this interval not displayed.   Inpatient medications: . apixaban  2.5 mg Per Tube BID  . chlorhexidine gluconate (MEDLINE KIT)  15 mL Mouth Rinse BID  . Chlorhexidine Gluconate Cloth  6 each Topical Q0600  . docusate  100 mg Per Tube BID  . feeding supplement (PRO-STAT SUGAR FREE 64)  30 mL Per Tube BID  . insulin aspart  0-6 Units Subcutaneous Q4H  . insulin aspart  2 Units Subcutaneous Q4H  . levothyroxine  25 mcg Per Tube Q0600  . mouth  rinse  15 mL Mouth Rinse 10 times per day  . metroNIDAZOLE  500 mg Per Tube Q8H  . nutrition supplement (JUVEN)  1 packet Per Tube BID BM  . pantoprazole (PROTONIX) IV  40 mg Intravenous Q24H  . polyethylene glycol  17 g Per Tube Daily  . vancomycin variable dose per unstable renal function (pharmacist dosing)   Does not apply See admin instructions   . sodium chloride    . sodium chloride    . sodium chloride 10 mL/hr at 03/01/20 0900  . cefTAZidime (FORTAZ)  IV Stopped (02/29/20 2007)  . feeding supplement (NEPRO CARB STEADY) 1,000 mL (02/29/20 1716)   sodium chloride, sodium chloride, sodium chloride, acetaminophen (TYLENOL) oral liquid 160 mg/5 mL, alteplase, fentaNYL (SUBLIMAZE) injection, heparin, heparin, heparin, lidocaine (PF), lidocaine-prilocaine, midazolam, midazolam, pentafluoroprop-tetrafluoroeth

## 2020-03-01 NOTE — Progress Notes (Addendum)
NAME:  Shane Banks, MRN:  [id number], DOB:  1945-11-24, LOS: 70 ADMISSION DATE:  02/14/2020, CONSULTATION DATE:  5/19 REFERRING MD:  Dr. Alvino Chapel, CHIEF COMPLAINT:  Hemoptysis   Brief History   74 yo m with PMHx of afib with RVR, BPH, stroke, and COVID ARDS in January requiring trach and LTACH placement at [facility]. Had been tolerating trach collar, but on 5/19 developed hemoptysis and hypoxemic respiratory failure requiring vent. Transferred to [facility] on 5/19.  Past Medical History   has a past medical history of Acute on chronic respiratory failure with hypoxia ([place]), Atrial fibrillation with RVR ([place]), BPH (benign prostatic hyperplasia), COVID-19 virus infection, Pneumonia due to COVID-19 virus, Severe sepsis ([place]), and Stroke ([place]).  Significant Hospital Events   1/8  Admit to [facility] for COVID PNA 2/23 Transfer to [facility] on vent 5/19 Transfer to [facility] for hemoptysis. #6 cuffed trach placed. Back on vent. Bronch performed 5/20 Transitioned to trach collar.  5/22 back on vent full time 5/24 back on antibiotics for fevers 6/1 Initiating PSV weans 6/3 Large cuff leak, trach exchange  Consults:  ENT Nephrology ID [id number]  Procedures:  ENT flex scope 5/19 > no bleeding source identified FOB 5/19 > no pulmonary bleeding source identified.   Significant Diagnostic Tests:  CXR 5/28 >> R CVC placement confirmed Slightly improved aeration at the right base. 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 Edema in the muscles around the hips and in the posterior paraspinal musculature of the lower lumbar spine and in the buttocks which could represent myositis. Fluid-fluid level in the bladder may represent protein or debris in the bladder. The possibility of urinary tract infection should  be considered.  CXR 6/3> improved R lung aeration, worsening L sided opacity. Tracheostomy tube, HD catheter.   CXR 6/4>wosened R lung infiltrates and stable L sided infiltrate. Possible bilateral pleural effusions  Micro Data:    tracheal aspirate 5/7> for Pseudomonas A  BAL 5/19 > no growth 5/19 blood > NG 5/22 trach aspirate>> candida [place] 5/22 blood>> 1/4 GPC> staph epi 5/25 blood>> GPC (enterococcus on biofire) 2/4 bottles>> E. Faecalis, staph epi. 5/25 trach aspirate>>many PMN, few GPR, rare GPC> diphtheroids 5/25 blood cx>> + for Enterococcus Faecalis (gent resistant)/ Staphylococcus Epidermidis (tetracycline, vanc, rifampin sensitive) 5/27 BCx> ngtd    Antimicrobials:  Levofloxacin 5/24>5/26 vanc 5/25> Ceftriaxone  5/27>6/1 Metronidazole 5/27>  [name] 6/1>  Interim history/subjective:  Overnight noted to have increased work of breathing with VTI >> VTE, suggestive of cuff leak.   Today, patient resting in bed. Not responsive to commands and not fully alert, but mouthing words to himself.   Objective   Blood pressure 109/76, pulse (!) 106, temperature 98.4 F (36.9 C), temperature source Oral, resp. rate (!) 35, height _0  (1.753 m), weight 59.5 kg, SpO2 100 %.    Vent Mode: PRVC FiO2 (%):  [30 %] 30 % Set Rate:  [15 bmp-16 bmp] 15 bmp Vt Set:  [370 mL-480 mL] 480 mL PEEP:  [5 cmH20] 5 cmH20 Pressure Support:  [12 cmH20] 12 cmH20 Plateau Pressure:  [25 cmH20] 25 cmH20   Intake/Output Summary (Last 24 hours) at 03/01/2020 0947 Last data filed at 03/01/2020 0900 Gross per 24 hour  Intake 1564.96 ml  Output 1385 ml  Net 179.96 ml   Filed Weights   02/29/20  1315 02/29/20 1727 03/01/20 0500  Weight: 62 kg 60.4 kg 59.5 kg    Examination:   Gen:   Chronically ill appearing frail older adult M. Trach/vent NAD  HEENT:  NCAT  # 6 cuffed trach secure. Trachea midline  Lungs:  Increased RR. Symmetrical chest expansion on PSV/CPAP. Bilateral rhonchi  CV:      Tachycardic rate. s1s2   Abd:   Soft flat non-distended. Feeding tube with EN. Ext: No cyanosis or clubbing Skin:    Clean, dry, warm  Neuro:  AAO x3 following commands  Resolved Hospital Problem list     Assessment & Plan:   Acute on chronic hypoxemic respiratory failure requiring tracheostomy, prolonged MV: Trach s/p COVID pneumonia earlier this year. His oxygenation had  improved to tolerate 28% ATC, but has been back on the vent for several days. Course complicated by multiple pneumonias. Thought to have a cuff leak on 6/2, but trach exchange deferred due to apparently resolving volume discrepancy.   Cuff leak: Continues to have VTI >> VTE overnight and today. - Trach exchange today due to cuff leak - Continue attempts at weaning vent, goal to progress to TC. - VAP bundle - Cont vanc, flagyl, fortaz  - PRN CXR   LUL pneumonia with new R-sided infiltrates: diphtheroids on trach aspirate 5/25, abundant corynebacterium striatum. 6/4 CXR with progression of L-sided PNA and R-sided infiltrates - Receiving multiple antibiotics as above that should provide sufficient coverage   Best practice:  Per primary team   Marshell Garfinkel MD [facility] Please see [email or web address] for pager details.  03/01/2020, 3:16 PM

## 2020-03-02 LAB — CBC WITH DIFFERENTIAL/PLATELET
Abs Immature Granulocytes: 0.83 10*3/uL — ABNORMAL HIGH (ref 0.00–0.07)
Basophils Absolute: 0.1 10*3/uL (ref 0.0–0.1)
Basophils Relative: 1 %
Eosinophils Absolute: 0.6 10*3/uL — ABNORMAL HIGH (ref 0.0–0.5)
Eosinophils Relative: 3 %
HCT: 29.9 % — ABNORMAL LOW (ref 39.0–52.0)
Hemoglobin: 9.4 g/dL — ABNORMAL LOW (ref 13.0–17.0)
Immature Granulocytes: 4 %
Lymphocytes Relative: 13 %
Lymphs Abs: 2.5 10*3/uL (ref 0.7–4.0)
MCH: 25.9 pg — ABNORMAL LOW (ref 26.0–34.0)
MCHC: 31.4 g/dL (ref 30.0–36.0)
MCV: 82.4 fL (ref 80.0–100.0)
Monocytes Absolute: 1.5 10*3/uL — ABNORMAL HIGH (ref 0.1–1.0)
Monocytes Relative: 7 %
Neutro Abs: 14.5 10*3/uL — ABNORMAL HIGH (ref 1.7–7.7)
Neutrophils Relative %: 72 %
Platelets: 305 10*3/uL (ref 150–400)
RBC: 3.63 MIL/uL — ABNORMAL LOW (ref 4.22–5.81)
RDW: 18.2 % — ABNORMAL HIGH (ref 11.5–15.5)
WBC: 20.1 10*3/uL — ABNORMAL HIGH (ref 4.0–10.5)
nRBC: 0 % (ref 0.0–0.2)

## 2020-03-02 LAB — GLUCOSE, CAPILLARY
Glucose-Capillary: 118 mg/dL — ABNORMAL HIGH (ref 70–99)
Glucose-Capillary: 138 mg/dL — ABNORMAL HIGH (ref 70–99)
Glucose-Capillary: 146 mg/dL — ABNORMAL HIGH (ref 70–99)
Glucose-Capillary: 147 mg/dL — ABNORMAL HIGH (ref 70–99)
Glucose-Capillary: 154 mg/dL — ABNORMAL HIGH (ref 70–99)
Glucose-Capillary: 161 mg/dL — ABNORMAL HIGH (ref 70–99)
Glucose-Capillary: 172 mg/dL — ABNORMAL HIGH (ref 70–99)

## 2020-03-02 LAB — BASIC METABOLIC PANEL
Anion gap: 14 (ref 5–15)
BUN: 105 mg/dL — ABNORMAL HIGH (ref 8–23)
CO2: 25 mmol/L (ref 22–32)
Calcium: 10 mg/dL (ref 8.9–10.3)
Chloride: 98 mmol/L (ref 98–111)
Creatinine, Ser: 3.37 mg/dL — ABNORMAL HIGH (ref 0.61–1.24)
GFR calc Af Amer: 20 mL/min — ABNORMAL LOW (ref >60)
GFR calc non Af Amer: 17 mL/min — ABNORMAL LOW (ref >60)
Glucose, Bld: 130 mg/dL — ABNORMAL HIGH (ref 70–99)
Potassium: 3.6 mmol/L (ref 3.5–5.1)
Sodium: 137 mmol/L (ref 135–145)

## 2020-03-02 MED ORDER — HEPARIN SODIUM (PORCINE) 1000 UNIT/ML IJ SOLN
INTRAMUSCULAR | Status: AC
  Administered 2020-03-02: 2000 [IU] via INTRAVENOUS_CENTRAL
  Filled 2020-03-02: qty 4

## 2020-03-02 NOTE — Progress Notes (Signed)
[facility] Progress Note  Subjective: I/O + 1.6 L yest   Wt up at [place] (admit 62.8kg, range 56.9- 64.7kg here)  Vitals:   03/02/20 1530 03/02/20 1545 03/02/20 1552 03/02/20 1600  BP: 102/61 99/69  (!) 97/55  Pulse: (!) 109 (!) 112  (!) 112  Resp:    (!) 35  Temp:   98.8 F (37.1 C)   TempSrc:   Oral   SpO2:    (!) 78%  Weight:      Height:        Exam:   In ICU on vent, awake  no jvd  Chest cta bilat ant/ lat  Cor reg no RG  Abd soft ntnd no ascites, +llq ostomy, + peg tube   Ext 1+ hip edema bilat   Neuro: as above     Summary: came from [facility] to [facility] after COVID hospitalization which included mech ventilation and trach placement. At [facility] had severe infections/ bacteremia/ PNA and developed AKI felt due to IV gent toxicity requiring initiation of HD 12/16/19. Admitted to ICU [facility] for trach bleed on 5/19. Was getting MWF HD at [facility] prior to admit here.   OP HD: started March 2021 at [facility] as above MWF   Getting HD here 3.5- 4h, UF 0.5- 1.5 L, hep 2000 prn   admit 62.8kg > range 56.9- 64.7kg here  Assessment/ Plan: 1. Enterococcus/ MRSE bacteremia HD cath sepsis - TDC removed 5/26, per ID getting 6 wks vanc/ fortaz/ flagyl for polymicrobial sepsis and osteomyelitis.  2. Sacral osteo/ decub stage IV - per ID, also abx as above 3. ESRD/ AKI - on HD since mid March 2021, d/t gent toxicity. HD MWF at [facility]. SP new TDC yest 6/2 by IR.  Donah Driver w/o sig W[place]ank, not sure cause.  HD today to get back on schedule.  4. A/C resp failure/ PNA -  vent issues per CCM, IV abx as above 5. Trach bleed - resolved 6. Tracheobronchitis - sp course of doxycycline 7. AMS - resolved, responsive now 8. Atrial fib - per primary 9. Anemia ckd - transfuse prn, high Ca++ (w/ low pth) prob from immobility, corrCa 11.0 > 10.5 today, improved. Cont low 2.0 Ca++ bath , PTH 29 10. 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 SP COVID  pna - original problem in Feb at [employer] 03/02/2020, 4:15 PM   Recent Labs  Lab 02/27/20 0316 02/27/20 0705 03/01/20 0355 03/02/20 0244  K 3.9   < > 3.4* 3.6  BUN 135*   < > 59* 105*  CREATININE 4.73*   < > 2.47* 3.37*  CALCIUM 10.2   < > 9.2 10.0  PHOS 5.8*  --   --   --   HGB 9.2*   < > 9.1* 9.4*   < > = values in this interval not displayed.   Inpatient medications: . apixaban  2.5 mg Per Tube BID  . chlorhexidine gluconate (MEDLINE KIT)  15 mL Mouth Rinse BID  . Chlorhexidine Gluconate Cloth  6 each Topical Q0600  . Chlorhexidine Gluconate Cloth  6 each Topical Q0600  . docusate  100 mg Per Tube BID  . feeding supplement (PRO-STAT SUGAR FREE 64)  30 mL Per Tube BID  . insulin aspart  0-6 Units Subcutaneous Q4H  . insulin aspart  2 Units Subcutaneous Q4H  . levothyroxine  25 mcg  Per Tube Q0600  . mouth rinse  15 mL Mouth Rinse 10 times per day  . metroNIDAZOLE  500 mg Per Tube Q8H  . nutrition supplement (JUVEN)  1 packet Per Tube BID BM  . pantoprazole sodium  40 mg Per Tube Daily  . polyethylene glycol  17 g Per Tube Daily  . vancomycin  500 mg Intravenous Q M,W,F-HD  . vancomycin  500 mg Intravenous Q T,Th,Sa-HD   . sodium chloride    . sodium chloride    . sodium chloride 10 mL/hr at 03/02/20 1600  . cefTAZidime (FORTAZ)  IV Stopped (03/01/20 1734)  . [START ON 03/04/2020] cefTAZidime (FORTAZ)  IV    . feeding supplement (NEPRO CARB STEADY) 45 mL/hr at 03/02/20 0700   sodium chloride, sodium chloride, sodium chloride, acetaminophen (TYLENOL) oral liquid 160 mg/5 mL, alteplase, fentaNYL (SUBLIMAZE) injection, heparin, heparin, lidocaine (PF), lidocaine-prilocaine, midazolam, midazolam, pentafluoroprop-tetrafluoroeth

## 2020-03-02 NOTE — Progress Notes (Signed)
[place] Progress Note Patient Name: Shane Banks DOB: 07/23/1946 MRN: [id number]   Date of Service  03/02/2020  HPI/Events of Note  Tachypnea and slightly labored breathing.  eICU Interventions  Portable CXR ordered.        [name] Zoey Bidwell 03/02/2020, 11:59 PM

## 2020-03-02 NOTE — Progress Notes (Signed)
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  Patient had recent history of Covid pneumonia and was on ventilator because he progressed to ARDS.  Because of little improvement in the ventilatory and oxygenation, he underwent tracheostomy and was discharged to [facility].  He had recent history of UTI, multidrug-resistant Pseudomonas pneumonia, stenotrophomonas bacteremia.  Because of sepsis and shock, he developed renal failure requiring dialysis.  On 5/19, he developed hemoptysis and was transferred to [facility] ED for evaluation.  PCCM,ID,Nephrology following here.  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  He also underwent tracheostomy tube change by PCCM for persistent leak.  Due to lack of insurance, he cannot go back to [facility].  Social worker assisting on placement.    Assessment & Plan:   Principal Problem:   Enterococcal bacteremia Active Problems:   Acute respiratory failure (HCC)   Hemoptysis   Pressure injury of skin   Protein-calorie malnutrition, severe   Decubitus ulcer of sacral region, stage 4 (HCC)   Fever   Acute on chronic hypoxic respiratory failure: Status post trach due to respiratory failure from Covid pneumonia.  Currently on vent.  PCCM managing vent.  History of multiple episodes of pneumonia from different organisms and was treated for different antibiotics.  Most recently pneumonia has been multidrug-resistant Pseudomonas for which she was treated with Avycaz and mycin nebulizers at [facility].  Possible ongoing aspiration also.  Presented with hemoptysis, aspiration of blood.  Patient has cuffed trach placed by ENT.   No active bleeding on bronchoscopy.  Continue attempts at weaning vent goal is to progress to trach collar.  He also underwent tracheostomy tube change by PCCM for persistent leak on 03/01/20.  Paroxysmal A. fib: Currently rate is controlled.  Anticoagulation was discontinued due to suspected GI bleed.  Currently on low-dose Eliquis.  On rate control with Cardizem.  Sepsis/bacteremia: Enterococcus and Staphylococcus epidermidis bacteremia.  Also has osteomyelitis fifth sacral segment.  Chest x-ray showed persistent bilateral lower lobe infiltrates.  High suspicion for ongoing aspiration.  Currently on vancomycin with hemodialysis.  Infectious disease consulted and following.  MRI pelvis/sacrum showed osteomyelitis of fifth sacral segment, edema in the muscle around both hips and proximal thighs with possible myositis.  Currently on vancomycin, ceftriaxone, Flagyl.  IR consulted for bone biopsy but due to high risk  it was not done. ID recommended treatment for 6 weeks for sacral osteomyelitis and staph epidermidis bacteremia with vancomycin, ceftriaxone 2 g IV daily and metronidazole. He has leukocytosis.  Continue to monitor CBC.  ESRD: On dialysis per nephrology.  Underwent  tunnel catheter placement on 6/2.  Undergoing dialysis as per nephrology.  Chronic normocytic anemia: Secondary to ESRD, critical illness.  Has been transfused with PRBCs.  Currently hemoglobin stable.  Continue to monitor  Unstageable sacral pressure ulcer/osteomyelitis: Wound care following.  Discussion as above.  Cachexia/severe protein malnutrition: On tube feeds.  Nutrition following.  Mild elevated LFTs: Continue to monitor.  Disposition: Patient is from [facility].  Anticipate discharge to SNF but due to lack of insurance, placement has been difficult.  Social working assisting    Nutrition Problem: Severe Malnutrition Etiology: chronic illness(chronic respiratory failure related to COVID ARDS)  DVT  prophylaxis: Eliquis Code Status: DNR Family Communication: Called sister for update on 02/29/20 Status is: Inpatient  Remains inpatient appropriate because:Unsafe d/c plan   Dispo: The patient is from: SNF              Anticipated d/c is to: SNF              Anticipated d/c date is: > 3 days   Consultants: ID, PCCM, IR, nephrology  Procedures: ENT flex scope 5/19 > no bleeding source identified FOB 5/19 > no pulmonary bleeding source identified. 6/2: Tunnel catheter placement by IR. 6/4: Changed trach tube  Antimicrobials:  Anti-infectives (From admission, onward)   Start     Dose/Rate Route Frequency Ordered Stop   03/04/20 2000  cefTAZidime (FORTAZ) 2 g in sodium chloride 0.9 % 100 mL IVPB     2 g 200 mL/hr over 30 Minutes Intravenous Every M-W-F (2000) 03/01/20 1136     03/02/20 1200  vancomycin (VANCOCIN) IVPB 500 mg/100 ml premix     500 mg 100 mL/hr over 60 Minutes Intravenous Every T-Th-Sa (Hemodialysis) 03/01/20 1131 03/05/20 1159   03/01/20 1230  vancomycin (VANCOCIN) IVPB 500 mg/100 ml premix     500 mg 100 mL/hr over 60 Minutes Intravenous Every M-W-F (Hemodialysis) 03/01/20 1131     02/28/20 1110  vancomycin (VANCOREADY) IVPB 500 mg/100 mL     over 60 Minutes  Continuous PRN 02/28/20 1112 02/28/20 1110   02/28/20 1101  vancomycin (VANCOCIN) 1-5 GM/200ML-% IVPB  Status:  Discontinued    Note to Pharmacy: Lytle Butte   : cabinet override      02/28/20 1101 02/28/20 1327   02/27/20 1800  cefTAZidime (FORTAZ) 1 g in sodium chloride 0.9 % 100 mL IVPB     1 g 200 mL/hr over 30 Minutes Intravenous Every 24 hours 02/27/20 0947 03/03/20 2359   02/27/20 1200  vancomycin (VANCOCIN) IVPB 500 mg/100 ml premix     500 mg 100 mL/hr over 60 Minutes Intravenous Once 02/27/20 0829 02/27/20 1343   02/24/20 1800  vancomycin (VANCOREADY) IVPB 500 mg/100 mL     500 mg 100 mL/hr over 60 Minutes Intravenous  Once 02/24/20 1308 02/24/20 1905   02/22/20 2200  metroNIDAZOLE (FLAGYL)  tablet 500 mg     500 mg Per Tube Every 8 hours 02/22/20 0957 04/04/20 2359   02/22/20 1700  cefTRIAXone (ROCEPHIN) 2 g in sodium chloride 0.9 % 100 mL IVPB  Status:  Discontinued     2 g 200 mL/hr over 30 Minutes Intravenous Every 24 hours 02/22/20 0957 02/27/20 0947   02/22/20 1200  vancomycin (VANCOCIN) IVPB 500 mg/100 ml premix  Status:  Discontinued     500 mg 100 mL/hr over 60 Minutes Intravenous Every T-Th-Sa (Hemodialysis) 02/20/20 1253 02/21/20 1040   02/21/20 1431  vancomycin variable dose per unstable renal function (pharmacist dosing)  Status:  Discontinued      Does not apply See admin instructions 02/21/20 1431 03/01/20 1131   02/21/20 1200  vancomycin (VANCOREADY) IVPB 500 mg/100 mL     500 mg 100 mL/hr over 60 Minutes Intravenous  Once 02/21/20 0956 02/21/20 1253   02/21/20 0714  vancomycin (VANCOCIN) 500-5 MG/100ML-% IVPB  Status:  Discontinued    Note to Pharmacy: Ashley Akin   : cabinet override      02/21/20 0714 02/21/20 1346   02/20/20 1400  vancomycin (VANCOREADY) IVPB 1250 mg/250 mL     1,250 mg 166.7 mL/hr over 90 Minutes  Intravenous  Once 02/20/20 1253 02/20/20 1538   02/20/20 1200  levofloxacin (LEVAQUIN) IVPB 500 mg  Status:  Discontinued     500 mg 100 mL/hr over 60 Minutes Intravenous Every 48 hours 02/20/20 1020 02/21/20 0943   02/15/20 1545  vancomycin (VANCOCIN) IVPB 1000 mg/200 mL premix  Status:  Discontinued     1,000 mg 200 mL/hr over 60 Minutes Intravenous Every 24 hours 02/14/20 1530 02/14/20 1745   02/15/20 0330  ceFEPIme (MAXIPIME) 2 g in sodium chloride 0.9 % 100 mL IVPB  Status:  Discontinued     2 g 200 mL/hr over 30 Minutes Intravenous Every 12 hours 02/14/20 1531 02/14/20 1531   02/14/20 1531  ceFEPIme (MAXIPIME) 2 g in sodium chloride 0.9 % 100 mL IVPB  Status:  Discontinued     2 g 200 mL/hr over 30 Minutes Intravenous Every 12 hours 02/14/20 1531 02/14/20 1745   02/14/20 1530  ceFEPIme (MAXIPIME) 2 g in sodium chloride 0.9 % 100 mL  IVPB  Status:  Discontinued     2 g 200 mL/hr over 30 Minutes Intravenous  Once 02/14/20 1520 02/14/20 1531   02/14/20 1530  vancomycin (VANCOREADY) IVPB 1500 mg/300 mL  Status:  Discontinued     1,500 mg 150 mL/hr over 120 Minutes Intravenous  Once 02/14/20 1530 02/14/20 1745      Subjective:  Patient seen and examined at the bedside this morning.  Hemodynamically stable.  No active issues.  Trach tube was changed yesterday.   Objective: Vitals:   03/02/20 0500 03/02/20 0600 03/02/20 0700 03/02/20 0750  BP: 110/66 119/73 124/74   Pulse: (!) 103 (!) 103 99   Resp: (!) 28 (!) 25 (!) 27   Temp:    99 F (37.2 C)  TempSrc:    Oral  SpO2: 100% 100% 96%   Weight: 58.9 kg     Height:        Intake/Output Summary (Last 24 hours) at 03/02/2020 0802 Last data filed at 03/02/2020 0700 Gross per 24 hour  Intake 1938.35 ml  Output 450 ml  Net 1488.35 ml   Filed Weights   02/29/20 1727 03/01/20 0500 03/02/20 0500  Weight: 60.4 kg 59.5 kg 58.9 kg    Examination:  General exam: Comfortable HEENT:Trach Respiratory system: Bilateral equal air entry, normal vesicular breath sounds, no wheezes or crackles  Cardiovascular system: Afib. No JVD, murmurs, rubs, gallops or clicks. Gastrointestinal system: Abdomen is nondistended, soft and nontender. No organomegaly or masses felt. Normal bowel sounds heard.  PEG, colostomy Central nervous system: Alert and awake Extremities: No edema, no clubbing ,no cyanosis Skin: No rashes, lesions .  None unstageable sacral ulcer present on admission.  Data Reviewed: I have personally reviewed following labs and imaging studies  CBC: Recent Labs  Lab 02/27/20 0705 02/28/20 0234 02/29/20 0230 03/01/20 0355 03/02/20 0244  WBC 15.8* 16.2* 17.2* 21.5* 20.1*  NEUTROABS  --   --  11.6* 15.4* 14.5*  HGB 9.1* 9.1* 8.8* 9.1* 9.4*  HCT 27.7* 28.9* 27.9* 29.2* 29.9*  MCV 81.5 81.9 83.0 82.7 82.4  PLT 277 268 254 289 950   Basic Metabolic  Panel: Recent Labs  Lab 02/25/20 0151 02/25/20 0151 02/26/20 0216 02/26/20 0216 02/27/20 0316 02/28/20 0234 02/29/20 0230 03/01/20 0355 03/02/20 0244  NA 141   < > 140   < > 141 139 139 136 137  K 4.5   < > 3.6   < > 3.9 3.6 3.9 3.4* 3.6  CL 98   < >  100   < > 98 98 99 99 98  CO2 21*   < > 23   < > 22 22 22 24 25   GLUCOSE 154*   < > 166*   < > 131* 144* 134* 156* 130*  BUN 157*   < > 95*   < > 135* 120* 151* 59* 105*  CREATININE 4.88*   < > 3.65*   < > 4.73* 4.22* 4.97* 2.47* 3.37*  CALCIUM 10.2   < > 9.8   < > 10.2 9.7 9.9 9.2 10.0  MG 2.5*  --  2.1  --   --   --   --   --   --   PHOS  --   --   --   --  5.8*  --   --   --   --    < > = values in this interval not displayed.   GFR: Estimated Creatinine Clearance: 16.5 mL/min (A) (by C-G formula based on SCr of 3.37 mg/dL (H)). Liver Function Tests: Recent Labs  Lab 02/25/20 0151 02/27/20 0316 02/28/20 0234  AST 62*  --  39  ALT 85*  --  47*  ALKPHOS 219*  --  233*  BILITOT 0.4  --  0.6  PROT 8.6*  --  8.2*  ALBUMIN 2.5* 2.3* 2.3*   No results for input(s): LIPASE, AMYLASE in the last 168 hours. No results for input(s): AMMONIA in the last 168 hours. Coagulation Profile: No results for input(s): INR, PROTIME in the last 168 hours. Cardiac Enzymes: No results for input(s): CKTOTAL, CKMB, CKMBINDEX, TROPONINI in the last 168 hours. BNP (last 3 results) No results for input(s): PROBNP in the last 8760 hours. HbA1C: No results for input(s): HGBA1C in the last 72 hours. CBG: Recent Labs  Lab 03/01/20 1521 03/01/20 2004 03/02/20 0002 03/02/20 0347 03/02/20 0753  GLUCAP 180* 140* 154* 138* 146*   Lipid Profile: No results for input(s): CHOL, HDL, LDLCALC, TRIG, CHOLHDL, LDLDIRECT in the last 72 hours. Thyroid Function Tests: No results for input(s): TSH, T4TOTAL, FREET4, T3FREE, THYROIDAB in the last 72 hours. Anemia Panel: No results for input(s): VITAMINB12, FOLATE, FERRITIN, TIBC, IRON, RETICCTPCT in the  last 72 hours. Sepsis Labs: No results for input(s): PROCALCITON, LATICACIDVEN in the last 168 hours.  Recent Results (from the past 240 hour(s))  Culture, blood (Routine X 2) w Reflex to ID Panel     Status: None   Collection Time: 02/22/20 11:25 AM   Specimen: BLOOD RIGHT HAND  Result Value Ref Range Status   Specimen Description BLOOD RIGHT HAND  Final   Special Requests   Final    BOTTLES DRAWN AEROBIC ONLY Blood Culture adequate volume   Culture   Final    NO GROWTH 5 DAYS Performed at [facility], [address], [place], [place] [address]    Report Status 02/27/2020 FINAL  Final  Culture, blood (Routine X 2) w Reflex to ID Panel     Status: None   Collection Time: 02/22/20 11:25 AM   Specimen: BLOOD RIGHT HAND  Result Value Ref Range Status   Specimen Description BLOOD RIGHT HAND  Final   Special Requests   Final    BOTTLES DRAWN AEROBIC ONLY Blood Culture adequate volume   Culture   Final    NO GROWTH 5 DAYS Performed at [facility], [place] [address] [place], [place] [address]    Report Status 02/27/2020 FINAL  Final  Radiology Studies: DG CHEST PORT 1 VIEW  Result Date: 03/01/2020 CLINICAL DATA:  Pneumonia.  History of COVID. EXAM: PORTABLE CHEST 1 VIEW COMPARISON:  02/29/2020.  02/16/2020.  12/05/2019. FINDINGS: Tracheostomy tube stable position. Dialysis catheter stable position with tip over the lower right atrium. Stable fullness of the upper mediastinum most likely prominent great vessels. Heart size stable. Diffuse left lung infiltrate/edema again noted without interim change. Diffuse right lung infiltrate/edema noted on today's exam. Small bilateral pleural effusions can not be excluded. No pneumothorax. IMPRESSION: 1. Tracheostomy tube in stable position. Dialysis catheter in unchanged position with tip over the lower right atrium. 2. Stable diffuse left lung infiltrate/edema. Diffuse right lung infiltrates/edema noted on today's exam. Small  bilateral pleural effusions cannot be excluded. Electronically Signed   By: Marcello Moores  Register   On: 03/01/2020 07:45        Scheduled Meds: . apixaban  2.5 mg Per Tube BID  . chlorhexidine gluconate (MEDLINE KIT)  15 mL Mouth Rinse BID  . Chlorhexidine Gluconate Cloth  6 each Topical Q0600  . Chlorhexidine Gluconate Cloth  6 each Topical Q0600  . docusate  100 mg Per Tube BID  . feeding supplement (PRO-STAT SUGAR FREE 64)  30 mL Per Tube BID  . insulin aspart  0-6 Units Subcutaneous Q4H  . insulin aspart  2 Units Subcutaneous Q4H  . levothyroxine  25 mcg Per Tube Q0600  . mouth rinse  15 mL Mouth Rinse 10 times per day  . metroNIDAZOLE  500 mg Per Tube Q8H  . nutrition supplement (JUVEN)  1 packet Per Tube BID BM  . pantoprazole sodium  40 mg Per Tube Daily  . polyethylene glycol  17 g Per Tube Daily  . vancomycin  500 mg Intravenous Q M,W,F-HD  . vancomycin  500 mg Intravenous Q T,Th,Sa-HD   Continuous Infusions: . sodium chloride    . sodium chloride    . sodium chloride 10 mL/hr at 03/02/20 0700  . cefTAZidime (FORTAZ)  IV Stopped (03/01/20 1734)  . [START ON 03/04/2020] cefTAZidime (FORTAZ)  IV    . feeding supplement (NEPRO CARB STEADY) 45 mL/hr at 03/02/20 0700     LOS: 17 days    Time spent: 35 mins.More than 50% of that time was spent in counseling and/or coordination of care.      Shelly Coss, MD [facility] P6/01/2020, 8:02 AM

## 2020-03-03 ENCOUNTER — Inpatient Hospital Stay (HOSPITAL_COMMUNITY): Payer: Medicare HMO

## 2020-03-03 LAB — GLUCOSE, CAPILLARY
Glucose-Capillary: 130 mg/dL — ABNORMAL HIGH (ref 70–99)
Glucose-Capillary: 138 mg/dL — ABNORMAL HIGH (ref 70–99)
Glucose-Capillary: 140 mg/dL — ABNORMAL HIGH (ref 70–99)
Glucose-Capillary: 160 mg/dL — ABNORMAL HIGH (ref 70–99)
Glucose-Capillary: 165 mg/dL — ABNORMAL HIGH (ref 70–99)
Glucose-Capillary: 171 mg/dL — ABNORMAL HIGH (ref 70–99)

## 2020-03-03 LAB — CBC WITH DIFFERENTIAL/PLATELET
Abs Immature Granulocytes: 0.85 10*3/uL — ABNORMAL HIGH (ref 0.00–0.07)
Basophils Absolute: 0.1 10*3/uL (ref 0.0–0.1)
Basophils Relative: 1 %
Eosinophils Absolute: 0.4 10*3/uL (ref 0.0–0.5)
Eosinophils Relative: 2 %
HCT: 27.1 % — ABNORMAL LOW (ref 39.0–52.0)
Hemoglobin: 8.5 g/dL — ABNORMAL LOW (ref 13.0–17.0)
Immature Granulocytes: 4 %
Lymphocytes Relative: 9 %
Lymphs Abs: 2 10*3/uL (ref 0.7–4.0)
MCH: 25.8 pg — ABNORMAL LOW (ref 26.0–34.0)
MCHC: 31.4 g/dL (ref 30.0–36.0)
MCV: 82.1 fL (ref 80.0–100.0)
Monocytes Absolute: 1.8 10*3/uL — ABNORMAL HIGH (ref 0.1–1.0)
Monocytes Relative: 8 %
Neutro Abs: 16.7 10*3/uL — ABNORMAL HIGH (ref 1.7–7.7)
Neutrophils Relative %: 76 %
Platelets: 306 10*3/uL (ref 150–400)
RBC: 3.3 MIL/uL — ABNORMAL LOW (ref 4.22–5.81)
RDW: 18.1 % — ABNORMAL HIGH (ref 11.5–15.5)
WBC: 21.9 10*3/uL — ABNORMAL HIGH (ref 4.0–10.5)
nRBC: 0 % (ref 0.0–0.2)

## 2020-03-03 LAB — BASIC METABOLIC PANEL
Anion gap: 12 (ref 5–15)
BUN: 53 mg/dL — ABNORMAL HIGH (ref 8–23)
CO2: 26 mmol/L (ref 22–32)
Calcium: 9.2 mg/dL (ref 8.9–10.3)
Chloride: 97 mmol/L — ABNORMAL LOW (ref 98–111)
Creatinine, Ser: 1.93 mg/dL — ABNORMAL HIGH (ref 0.61–1.24)
GFR calc Af Amer: 39 mL/min — ABNORMAL LOW (ref >60)
GFR calc non Af Amer: 34 mL/min — ABNORMAL LOW (ref >60)
Glucose, Bld: 144 mg/dL — ABNORMAL HIGH (ref 70–99)
Potassium: 3.9 mmol/L (ref 3.5–5.1)
Sodium: 135 mmol/L (ref 135–145)

## 2020-03-03 LAB — POCT I-STAT 7, (LYTES, BLD GAS, ICA,H+H)
Acid-Base Excess: 5 mmol/L — ABNORMAL HIGH (ref 0.0–2.0)
Bicarbonate: 28.4 mmol/L — ABNORMAL HIGH (ref 20.0–28.0)
Calcium, Ion: 1.3 mmol/L (ref 1.15–1.40)
HCT: 28 % — ABNORMAL LOW (ref 39.0–52.0)
Hemoglobin: 9.5 g/dL — ABNORMAL LOW (ref 13.0–17.0)
O2 Saturation: 95 %
Patient temperature: 98.6
Potassium: 3.8 mmol/L (ref 3.5–5.1)
Sodium: 138 mmol/L (ref 135–145)
TCO2: 30 mmol/L (ref 22–32)
pCO2 arterial: 36.5 mmHg (ref 32.0–48.0)
pH, Arterial: 7.5 — ABNORMAL HIGH (ref 7.350–7.450)
pO2, Arterial: 68 mmHg — ABNORMAL LOW (ref 83.0–108.0)

## 2020-03-03 MED ORDER — CHLORHEXIDINE GLUCONATE CLOTH 2 % EX PADS
6.0000 | MEDICATED_PAD | Freq: Every day | CUTANEOUS | Status: DC
  Administered 2020-03-04 – 2020-03-24 (×11): 6 via TOPICAL

## 2020-03-03 NOTE — Progress Notes (Signed)
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  Patient had recent history of Covid pneumonia and was on ventilator because he progressed to ARDS.  Because of little improvement in the ventilatory and oxygenation, he underwent tracheostomy and was discharged to [facility].  He had recent history of UTI, multidrug-resistant Pseudomonas pneumonia, stenotrophomonas bacteremia.  Because of sepsis and shock, he developed renal failure requiring dialysis.  On 5/19, he developed hemoptysis and was transferred to [facility] ED for evaluation.  PCCM,ID,Nephrology following here.  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  He also underwent tracheostomy tube change by PCCM for persistent leak.  Due to lack of insurance, he cannot go back to [facility].  Social worker assisting on placement.    Assessment & Plan:   Principal Problem:   Enterococcal bacteremia Active Problems:   Acute respiratory failure (HCC)   Hemoptysis   Pressure injury of skin   Protein-calorie malnutrition, severe   Decubitus ulcer of sacral region, stage 4 (HCC)   Fever   Acute on chronic hypoxic respiratory failure: Status post trach due to respiratory failure from Covid pneumonia.  Currently on vent.  PCCM managing vent.  History of multiple episodes of pneumonia from different organisms and was treated for different antibiotics.  Most recently pneumonia has been multidrug-resistant Pseudomonas for which she was treated with Avycaz and mycin nebulizers at [facility].  Possible ongoing aspiration also.  Presented with hemoptysis, aspiration of blood.  Patient has cuffed trach placed by ENT.   No active bleeding on bronchoscopy.  Continue attempts at weaning vent goal is to progress to trach collar.  He also underwent tracheostomy tube change by PCCM for persistent leak on 03/01/20.  Paroxysmal A. fib: Currently rate is controlled.  Anticoagulation was discontinued due to suspected GI bleed.  Currently on low-dose Eliquis.  On rate control with Cardizem.  Sepsis/bacteremia: Enterococcus and Staphylococcus epidermidis bacteremia.  Also has osteomyelitis fifth sacral segment.  Chest x-ray showed persistent bilateral lower lobe infiltrates.  High suspicion for ongoing aspiration.  Currently on vancomycin with hemodialysis.  Infectious disease consulted and following.  MRI pelvis/sacrum showed osteomyelitis of fifth sacral segment, edema in the muscle around both hips and proximal thighs with possible myositis.  Currently on vancomycin, ceftriaxone, Flagyl.  IR consulted for bone biopsy but due to high risk  it was not done. ID recommended treatment for 6 weeks for sacral osteomyelitis and staph epidermidis bacteremia with vancomycin, ceftriaxone 2 g IV daily and metronidazole. He has leukocytosis.  Continue to monitor CBC. He became febrile last night, new blood cultures have been sent.  ESRD: On dialysis per nephrology.  Underwent  tunnel catheter placement on 6/2.  Undergoing dialysis as per nephrology.  Chronic normocytic anemia: Secondary to ESRD, critical illness.  Has been transfused with PRBCs.  Currently hemoglobin stable.  Continue to monitor  Unstageable sacral pressure ulcer/osteomyelitis: Wound care following.  Discussion as above.  Cachexia/severe protein malnutrition: On tube feeds.  Nutrition following.  Mild elevated LFTs: Continue to monitor.  Disposition: Patient is from [place].  Anticipate discharge to SNF but due to lack of insurance, placement has been difficult.  Social working assisting    Nutrition Problem: Severe Malnutrition Etiology: chronic illness(chronic  respiratory failure related to COVID ARDS)      DVT prophylaxis: Eliquis Code Status: DNR Family Communication: Called sister for update on 02/29/20 Status is: Inpatient  Remains inpatient appropriate because:Unsafe d/c plan   Dispo: The patient is from: SNF              Anticipated d/c is to: SNF              Anticipated d/c date is: > 3 days   Consultants: ID, PCCM, IR, nephrology  Procedures: ENT flex scope 5/19 > no bleeding source identified FOB 5/19 > no pulmonary bleeding source identified. 6/2: Tunnel catheter placement by IR. 6/4: Changed trach tube  Antimicrobials:  Anti-infectives (From admission, onward)   Start     Dose/Rate Route Frequency Ordered Stop   03/04/20 2000  cefTAZidime (FORTAZ) 2 g in sodium chloride 0.9 % 100 mL IVPB     2 g 200 mL/hr over 30 Minutes Intravenous Every M-W-F (2000) 03/01/20 1136     03/02/20 1200  vancomycin (VANCOCIN) IVPB 500 mg/100 ml premix     500 mg 100 mL/hr over 60 Minutes Intravenous Every T-Th-Sa (Hemodialysis) 03/01/20 1131 03/02/20 1710   03/01/20 1230  vancomycin (VANCOCIN) IVPB 500 mg/100 ml premix     500 mg 100 mL/hr over 60 Minutes Intravenous Every M-W-F (Hemodialysis) 03/01/20 1131     02/28/20 1110  vancomycin (VANCOREADY) IVPB 500 mg/100 mL     over 60 Minutes  Continuous PRN 02/28/20 1112 02/28/20 1110   02/28/20 1101  vancomycin (VANCOCIN) 1-5 GM/200ML-% IVPB  Status:  Discontinued    Note to Pharmacy: Lytle Butte   : cabinet override      02/28/20 1101 02/28/20 1327   02/27/20 1800  cefTAZidime (FORTAZ) 1 g in sodium chloride 0.9 % 100 mL IVPB     1 g 200 mL/hr over 30 Minutes Intravenous Every 24 hours 02/27/20 0947 03/03/20 2359   02/27/20 1200  vancomycin (VANCOCIN) IVPB 500 mg/100 ml premix     500 mg 100 mL/hr over 60 Minutes Intravenous Once 02/27/20 0829 02/27/20 1343   02/24/20 1800  vancomycin (VANCOREADY) IVPB 500 mg/100 mL     500 mg 100 mL/hr over 60 Minutes Intravenous  Once 02/24/20  1308 02/24/20 1905   02/22/20 2200  metroNIDAZOLE (FLAGYL) tablet 500 mg     500 mg Per Tube Every 8 hours 02/22/20 0957 04/04/20 2359   02/22/20 1700  cefTRIAXone (ROCEPHIN) 2 g in sodium chloride 0.9 % 100 mL IVPB  Status:  Discontinued     2 g 200 mL/hr over 30 Minutes Intravenous Every 24 hours 02/22/20 0957 02/27/20 0947   02/22/20 1200  vancomycin (VANCOCIN) IVPB 500 mg/100 ml premix  Status:  Discontinued     500 mg 100 mL/hr over 60 Minutes Intravenous Every T-Th-Sa (Hemodialysis) 02/20/20 1253 02/21/20 1040   02/21/20 1431  vancomycin variable dose per unstable renal function (pharmacist dosing)  Status:  Discontinued      Does not apply See admin instructions 02/21/20 1431 03/01/20 1131   02/21/20 1200  vancomycin (VANCOREADY) IVPB 500 mg/100 mL     500 mg 100 mL/hr over 60 Minutes Intravenous  Once 02/21/20 0956 02/21/20 1253   02/21/20 0714  vancomycin (VANCOCIN) 500-5 MG/100ML-% IVPB  Status:  Discontinued    Note to Pharmacy: Ashley Akin   : cabinet override      02/21/20 0714 02/21/20 1346   02/20/20 1400  vancomycin (VANCOREADY) IVPB 1250 mg/250 mL  1,250 mg 166.7 mL/hr over 90 Minutes Intravenous  Once 02/20/20 1253 02/20/20 1538   02/20/20 1200  levofloxacin (LEVAQUIN) IVPB 500 mg  Status:  Discontinued     500 mg 100 mL/hr over 60 Minutes Intravenous Every 48 hours 02/20/20 1020 02/21/20 0943   02/15/20 1545  vancomycin (VANCOCIN) IVPB 1000 mg/200 mL premix  Status:  Discontinued     1,000 mg 200 mL/hr over 60 Minutes Intravenous Every 24 hours 02/14/20 1530 02/14/20 1745   02/15/20 0330  ceFEPIme (MAXIPIME) 2 g in sodium chloride 0.9 % 100 mL IVPB  Status:  Discontinued     2 g 200 mL/hr over 30 Minutes Intravenous Every 12 hours 02/14/20 1531 02/14/20 1531   02/14/20 1531  ceFEPIme (MAXIPIME) 2 g in sodium chloride 0.9 % 100 mL IVPB  Status:  Discontinued     2 g 200 mL/hr over 30 Minutes Intravenous Every 12 hours 02/14/20 1531 02/14/20 1745   02/14/20 1530   ceFEPIme (MAXIPIME) 2 g in sodium chloride 0.9 % 100 mL IVPB  Status:  Discontinued     2 g 200 mL/hr over 30 Minutes Intravenous  Once 02/14/20 1520 02/14/20 1531   02/14/20 1530  vancomycin (VANCOREADY) IVPB 1500 mg/300 mL  Status:  Discontinued     1,500 mg 150 mL/hr over 120 Minutes Intravenous  Once 02/14/20 1530 02/14/20 1745      Subjective:  Patient seen and examined at the bedside this morning.  He was mildly tachycardic this morning.  He was febrile last night.  He was tachypneic during my evaluation but not in apparent respiratory distress.  No other new findings.   Objective: Vitals:   03/03/20 0800 03/03/20 0801 03/03/20 0900 03/03/20 1000  BP: 118/78  104/71 117/66  Pulse: (!) 108 (!) 110 (!) 108 (!) 106  Resp: (!) 46 (!) 40 (!) 35 (!) 41  Temp:      TempSrc:      SpO2: 100% 100% 100% 100%  Weight:      Height:        Intake/Output Summary (Last 24 hours) at 03/03/2020 1048 Last data filed at 03/03/2020 1000 Gross per 24 hour  Intake 1458.59 ml  Output 1205 ml  Net 253.59 ml   Filed Weights   03/02/20 1330 03/02/20 1723 03/03/20 0500  Weight: 61.2 kg 60 kg 57.5 kg    Examination:  General exam: Not in distress, deconditioned, debilitated HEENT: Trach Respiratory system: Tachypnea, no wheezing or crackles  cardiovascular system:Afib. No JVD, murmurs, rubs, gallops or clicks. Gastrointestinal system: Abdomen is nondistended, soft and nontender. No organomegaly or masses felt. Normal bowel sounds heard.  PEG, colostomy Central nervous system: Alert and awake  extremities: No edema, no clubbing ,no cyanosis Skin: Nonstageable sacral ulcer present on admission  Data Reviewed: I have personally reviewed following labs and imaging studies  CBC: Recent Labs  Lab 02/28/20 0234 02/28/20 0234 02/29/20 0230 03/01/20 0355 03/02/20 0244 03/03/20 0257 03/03/20 1032  WBC 16.2*  --  17.2* 21.5* 20.1* 21.9*  --   NEUTROABS  --   --  11.6* 15.4* 14.5* 16.7*  --    HGB 9.1*   < > 8.8* 9.1* 9.4* 8.5* 9.5*  HCT 28.9*   < > 27.9* 29.2* 29.9* 27.1* 28.0*  MCV 81.9  --  83.0 82.7 82.4 82.1  --   PLT 268  --  254 289 305 306  --    < > = values in this interval not displayed.  Basic Metabolic Panel: Recent Labs  Lab 02/26/20 0216 02/26/20 0216 02/27/20 0316 02/27/20 0316 02/28/20 0234 02/28/20 0234 02/29/20 0230 03/01/20 0355 03/02/20 0244 03/03/20 0257 03/03/20 1032  NA 140   < > 141   < > 139   < > 139 136 137 135 138  K 3.6   < > 3.9   < > 3.6   < > 3.9 3.4* 3.6 3.9 3.8  CL 100   < > 98   < > 98  --  99 99 98 97*  --   CO2 23   < > 22   < > 22  --  _0 --   GLUCOSE 166*   < > 131*   < > 144*  --  134* 156* 130* 144*  --   BUN 95*   < > 135*   < > 120*  --  151* 59* 105* 53*  --   CREATININE 3.65*   < > 4.73*   < > 4.22*  --  4.97* 2.47* 3.37* 1.93*  --   CALCIUM 9.8   < > 10.2   < > 9.7  --  9.9 9.2 10.0 9.2  --   MG 2.1  --   --   --   --   --   --   --   --   --   --   PHOS  --   --  5.8*  --   --   --   --   --   --   --   --    < > = values in this interval not displayed.   GFR: Estimated Creatinine Clearance: 28.1 mL/min (A) (by C-G formula based on SCr of 1.93 mg/dL (H)). Liver Function Tests: Recent Labs  Lab 02/27/20 0316 02/28/20 0234  AST  --  39  ALT  --  47*  ALKPHOS  --  233*  BILITOT  --  0.6  PROT  --  8.2*  ALBUMIN 2.3* 2.3*   No results for input(s): LIPASE, AMYLASE in the last 168 hours. No results for input(s): AMMONIA in the last 168 hours. Coagulation Profile: No results for input(s): INR, PROTIME in the last 168 hours. Cardiac Enzymes: No results for input(s): CKTOTAL, CKMB, CKMBINDEX, TROPONINI in the last 168 hours. BNP (last 3 results) No results for input(s): PROBNP in the last 8760 hours. HbA1C: No results for input(s): HGBA1C in the last 72 hours. CBG: Recent Labs  Lab 03/02/20 1555 03/02/20 1920 03/02/20 2339 03/03/20 0411 03/03/20 0736  GLUCAP 118* 172* 161* 160* 140*    Lipid Profile: No results for input(s): CHOL, HDL, LDLCALC, TRIG, CHOLHDL, LDLDIRECT in the last 72 hours. Thyroid Function Tests: No results for input(s): TSH, T4TOTAL, FREET4, T3FREE, THYROIDAB in the last 72 hours. Anemia Panel: No results for input(s): VITAMINB12, FOLATE, FERRITIN, TIBC, IRON, RETICCTPCT in the last 72 hours. Sepsis Labs: No results for input(s): PROCALCITON, LATICACIDVEN in the last 168 hours.  Recent Results (from the past 240 hour(s))  Culture, blood (Routine X 2) w Reflex to ID Panel     Status: None   Collection Time: 02/22/20 11:25 AM   Specimen: BLOOD RIGHT HAND  Result Value Ref Range Status   Specimen Description BLOOD RIGHT HAND  Final   Special Requests   Final    BOTTLES DRAWN AEROBIC ONLY Blood Culture adequate volume   Culture   Final    NO GROWTH 5  DAYS Performed at [facility], [place] [address], [place], [place] [address]    Report Status 02/27/2020 FINAL  Final  Culture, blood (Routine X 2) w Reflex to ID Panel     Status: None   Collection Time: 02/22/20 11:25 AM   Specimen: BLOOD RIGHT HAND  Result Value Ref Range Status   Specimen Description BLOOD RIGHT HAND  Final   Special Requests   Final    BOTTLES DRAWN AEROBIC ONLY Blood Culture adequate volume   Culture   Final    NO GROWTH 5 DAYS Performed at [facility], [place] [address] [place], [place] [address]    Report Status 02/27/2020 FINAL  Final         Radiology Studies: DG Chest Port 1 View  Result Date: 03/03/2020 CLINICAL DATA:  Acute respiratory failure EXAM: PORTABLE CHEST 1 VIEW COMPARISON:  Radiograph 02/29/2020, CT 12/21/2019 FINDINGS: Tracheostomy tube terminates in the mid trachea. Dual lumen dialysis catheter tip terminates in the right atrium. Telemetry leads overlie the chest. Persistent bilateral pleural effusions, left greater than right with extensive opacity throughout the left hemithorax. More coarse interstitial opacities throughout the right lung  are similar to slightly improved from prior. No pneumothorax. Cardiomediastinal contours are stable with a calcified aorta. No acute osseous or soft tissue abnormality. IMPRESSION: 1. Bilateral pleural effusions, left greater than right. 2. Extensive stable opacity throughout the left hemithorax. 3. More coarse interstitial opacities throughout the right lung are similar to slightly improved. Electronically Signed   By: Lovena Le M.D.   On: 03/03/2020 00:27        Scheduled Meds: . apixaban  2.5 mg Per Tube BID  . chlorhexidine gluconate (MEDLINE KIT)  15 mL Mouth Rinse BID  . Chlorhexidine Gluconate Cloth  6 each Topical Q0600  . Chlorhexidine Gluconate Cloth  6 each Topical Q0600  . docusate  100 mg Per Tube BID  . feeding supplement (PRO-STAT SUGAR FREE 64)  30 mL Per Tube BID  . insulin aspart  0-6 Units Subcutaneous Q4H  . insulin aspart  2 Units Subcutaneous Q4H  . levothyroxine  25 mcg Per Tube Q0600  . mouth rinse  15 mL Mouth Rinse 10 times per day  . metroNIDAZOLE  500 mg Per Tube Q8H  . nutrition supplement (JUVEN)  1 packet Per Tube BID BM  . pantoprazole sodium  40 mg Per Tube Daily  . polyethylene glycol  17 g Per Tube Daily  . vancomycin  500 mg Intravenous Q M,W,F-HD   Continuous Infusions: . sodium chloride    . sodium chloride    . sodium chloride 10 mL/hr at 03/03/20 1000  . cefTAZidime (FORTAZ)  IV Stopped (03/02/20 1836)  . [START ON 03/04/2020] cefTAZidime (FORTAZ)  IV    . feeding supplement (NEPRO CARB STEADY) 1,000 mL (03/02/20 1955)     LOS: 18 days    Time spent: 35 mins.More than 50% of that time was spent in counseling and/or coordination of care.      Shelly Coss, MD [facility] P6/02/2020, 10:48 AM

## 2020-03-03 NOTE — Progress Notes (Signed)
NAME:  Shane Banks, MRN:  [id number], DOB:  January 15, 1946, LOS: 37 ADMISSION DATE:  02/14/2020, CONSULTATION DATE:  5/19 REFERRING MD:  Dr. Alvino Chapel, CHIEF COMPLAINT:  Hemoptysis   Brief History   74 yo m with PMHx of afib with RVR, BPH, stroke, and COVID ARDS in January requiring trach and LTACH placement at [facility]. Had been tolerating trach collar, but on 5/19 developed hemoptysis and hypoxemic respiratory failure requiring vent. Transferred to [facility] on 5/19.  Past Medical History   has a past medical history of Acute on chronic respiratory failure with hypoxia ([place]), Atrial fibrillation with RVR ([place]), BPH (benign prostatic hyperplasia), COVID-19 virus infection, Pneumonia due to COVID-19 virus, Severe sepsis ([place]), and Stroke ([place]).  Significant Hospital Events   1/8  Admit to [facility] for COVID PNA 2/23 Transfer to [facility] on vent 5/19 Transfer to [facility] for hemoptysis. #6 cuffed trach placed. Back on vent. Bronch performed 5/20 Transitioned to trach collar.  5/22 back on vent full time 5/24 back on antibiotics for fevers 6/1 Initiating PSV weans 6/3 Large cuff leak, trach exchange  Consults:  ENT Nephrology ID PCCM  Procedures:  ENT flex scope 5/19 > no bleeding source identified FOB 5/19 > no pulmonary bleeding source identified.   Significant Diagnostic Tests:  CXR 5/28 >> R CVC placement confirmed Slightly improved aeration at the right base. 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 Edema in the muscles around the hips and in the posterior paraspinal musculature of the lower lumbar spine and in the buttocks which could represent myositis. Fluid-fluid level in the bladder may represent protein or debris in the bladder. The possibility of urinary tract infection should  be considered.  CXR 6/3> improved R lung aeration, worsening L sided opacity. Tracheostomy tube, HD catheter.   CXR 6/4>wosened R lung infiltrates and stable L sided infiltrate. Possible bilateral pleural effusions  Micro Data:    tracheal aspirate 5/7> for Pseudomonas A  BAL 5/19 > no growth 5/19 blood > NG 5/22 trach aspirate>> candida [place] 5/22 blood>> 1/4 GPC> staph epi 5/25 blood>> GPC (enterococcus on biofire) 2/4 bottles>> E. Faecalis, staph epi. 5/25 trach aspirate>>many PMN, few GPR, rare GPC> diphtheroids 5/25 blood cx>> + for Enterococcus Faecalis (gent resistant)/ Staphylococcus Epidermidis (tetracycline, vanc, rifampin sensitive) 5/27 BCx> ngtd    Antimicrobials:  Levofloxacin 5/24>5/26 vanc 5/25> Ceftriaxone  5/27>6/1 Metronidazole 5/27>  [name] 6/1>  Interim history/subjective:  More tachypneic this AM. CXR unchanged. He denies SOB.  Objective   Blood pressure 118/78, pulse (!) 110, temperature 99.9 F (37.7 C), resp. rate (!) 40, height _0  (1.753 m), weight 57.5 kg, SpO2 100 %.    Vent Mode: PRVC FiO2 (%):  [30 %] 30 % Set Rate:  [15 bmp] 15 bmp Vt Set:  [480 mL] 480 mL PEEP:  [5 cmH20] 5 cmH20 Plateau Pressure:  [27 cmH20-41 cmH20] 31 cmH20   Intake/Output Summary (Last 24 hours) at 03/03/2020 0920 Last data filed at 03/03/2020 0800 Gross per 24 hour  Intake 1948.59 ml  Output 1205 ml  Net 743.59 ml   Filed Weights   03/02/20 1330 03/02/20 1723 03/03/20 0500  Weight: 61.2 kg 60 kg 57.5 kg    Examination:   GEN: chronically ill man on vent HEENT: trach in place, minimal secretions CV: RRR, ext warm PULM: tachypneic, coarse rhonci  bilaterally, + accessory muscle use GI: Soft, +BS, PEG and ostomy in place EXT: +muscle wasting, no edema NEURO: moves all 4 ext but weak PSYCH: RASS 0 SKIN: No rashes  Stable leukocytosis Stable CXR Mild fever  Resolved Hospital Problem list     Assessment & Plan:   Acute on chronic hypoxemic  respiratory failure requiring tracheostomy, 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  Apparently was able to tolerate ATC in past although I am having a hard time believing this.  His lung compliance is terrible as is his inspiratory strength. - Continue vent support, trials of PS, check ABG with his MV in 20s to get idea of his dead space - Abx per primary team - Fluid removal as much as tolerated by BP - Will follow with you - I am doubtful he will ever wean to TC   Other issues Enterococcus/ MRSE bacteremia HD cath sepsis  Sacral osteo/ decub stage IV  Afib S/p PEG, diverting ostomy  Best practice:  Per primary team   Erskine Emery MD PCCM

## 2020-03-03 NOTE — Consult Note (Signed)
WOC Nurse Consult Note: Reason for Consult: New pressure injury beneath trach, Stage 3 Wound type:Pressure Pressure Injury POA: No Measurement: 1cm x 0.8cm x 0.2cm Stage 3 Wound bed: pink, moist, granulating at periphery Drainage (amount, consistency, odor) scant from wound. Subject to large amounts of mucus from trach Periwound: clean, dry Dressing procedure/placement/frequency: Silicone foam dressing to cover wound, In this instance, the thin dressing is less effective than the standard foam cut in half as it provided a bit more padding.   Ostomy in the LLQ and sacral chronic, Stage 4 wound were seen by my partner, D. Engels on 02/15/20.  Orders are active and the Bedside RN reports no new needs.  [facility] nursing team will follow, seeing patient every 7-10 days and will remain available to this patient, the nursing and medical teams.   Thanks, Maudie Flakes, MSN, RN, [place], Arther Abbott  Pager# [phone]

## 2020-03-03 NOTE — Progress Notes (Signed)
[facility] Progress Note  Subjective: no new issues  Vitals:   03/03/20 1000 03/03/20 1100 03/03/20 1117 03/03/20 1200  BP: 117/66 113/65  108/63  Pulse: (!) 106 (!) 106  (!) 106  Resp: (!) 41 (!) 43  (!) 30  Temp:   100 F (37.8 C)   TempSrc:   Axillary   SpO2: 100% 100% 100% 100%  Weight:      Height:        Exam:   In ICU on vent, awake, ^wob  no jvd  Chest cta bilat ant/ lat  Cor reg no RG  Abd soft ntnd no ascites, +llq ostomy, + peg tube   Ext 1+ hip edema bilat   Neuro: as above     Summary: came from [facility] to [facility] hospitalization which included mech ventilation and trach placement. At [facility] had severe infections/ bacteremia/ PNA and developed AKI felt due to IV gent toxicity requiring initiation of HD 12/16/19. Admitted to ICU [facility] for trach bleed on 5/19. Was getting MWF HD at [facility] prior to admit here.   OP HD: started March 2021 at [facility] as above MWF   Getting HD here 3.5- 4h, UF 0.5- 1.5 L, hep 2000 prn   admit 62.8kg > range 56.9- 64.7kg here  Assessment/ Plan: 1. 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 On IV abx. Get volume down as tol w/ HD. Poor prognosis per CCM. 2. Enterococcus/ MRSE bacteremia HD cath sepsis - TDC removed 5/26, per ID getting 6 wks vanc/ fortaz/ flagyl for polymicrobial sepsis and osteomyelitis.  3. Sacral osteo/ decub stage IV - per ID, also abx as above 4. ESRD/ AKI - on HD since mid March 2021, d/t gent toxicity. HD MWF at [facility]. SP new TDC yest 6/2 by IR.  HD tomorrow, max UF as tolerated (see #4 below) 5. Trach bleed - resolved 6. Tracheobronchitis - sp course of doxycycline 7. AMS - in and out 8. DNR 9. Atrial fib - per primary 10. Anemia ckd - transfuse prn, high Ca++ (w/ low pth) prob from immobility, corrCa 11.0 > 10.5, improved. Cont low 2.0 Ca++ bath , PTH 29 11. BP/vol - no sig vol excess, down 4kg from admit, no vol^ on exam 12. SP peg/ divert  colostomy 13. SP COVID pna - original problem in Feb at [employer] 03/03/2020, 1:09 PM   Recent Labs  Lab 02/27/20 0316 02/27/20 0705 03/02/20 0244 03/02/20 0244 03/03/20 0257 03/03/20 1032  K 3.9   < > 3.6   < > 3.9 3.8  BUN 135*   < > 105*  --  53*  --   CREATININE 4.73*   < > 3.37*  --  1.93*  --   CALCIUM 10.2   < > 10.0  --  9.2  --   PHOS 5.8*  --   --   --   --   --   HGB 9.2*   < > 9.4*   < > 8.5* 9.5*   < > = values in this interval not displayed.   Inpatient medications:  apixaban  2.5 mg Per Tube BID   chlorhexidine gluconate (MEDLINE KIT)  15 mL Mouth Rinse BID   Chlorhexidine Gluconate Cloth  6 each Topical Q0600   Chlorhexidine Gluconate Cloth  6 each Topical Q0600   docusate  100 mg Per Tube BID   feeding supplement (PRO-STAT SUGAR  FREE 64)  30 mL Per Tube BID   insulin aspart  0-6 Units Subcutaneous Q4H   insulin aspart  2 Units Subcutaneous Q4H   levothyroxine  25 mcg Per Tube Q0600   mouth rinse  15 mL Mouth Rinse 10 times per day   metroNIDAZOLE  500 mg Per Tube Q8H   nutrition supplement (JUVEN)  1 packet Per Tube BID BM   pantoprazole sodium  40 mg Per Tube Daily   polyethylene glycol  17 g Per Tube Daily   vancomycin  500 mg Intravenous Q M,W,F-HD    sodium chloride     sodium chloride     sodium chloride 10 mL/hr at 03/03/20 1200   cefTAZidime (FORTAZ)  IV Stopped (03/02/20 1836)   [START ON 03/04/2020] cefTAZidime (FORTAZ)  IV     feeding supplement (NEPRO CARB STEADY) 1,000 mL (03/02/20 1955)   sodium chloride, sodium chloride, sodium chloride, acetaminophen (TYLENOL) oral liquid 160 mg/5 mL, alteplase, fentaNYL (SUBLIMAZE) injection, heparin, heparin, lidocaine (PF), lidocaine-prilocaine, midazolam, midazolam, pentafluoroprop-tetrafluoroeth

## 2020-03-04 LAB — RENAL FUNCTION PANEL
Albumin: 2.1 g/dL — ABNORMAL LOW (ref 3.5–5.0)
Anion gap: 13 (ref 5–15)
BUN: 117 mg/dL — ABNORMAL HIGH (ref 8–23)
CO2: 26 mmol/L (ref 22–32)
Calcium: 9.8 mg/dL (ref 8.9–10.3)
Chloride: 97 mmol/L — ABNORMAL LOW (ref 98–111)
Creatinine, Ser: 3.37 mg/dL — ABNORMAL HIGH (ref 0.61–1.24)
GFR calc Af Amer: 20 mL/min — ABNORMAL LOW (ref >60)
GFR calc non Af Amer: 17 mL/min — ABNORMAL LOW (ref >60)
Glucose, Bld: 142 mg/dL — ABNORMAL HIGH (ref 70–99)
Phosphorus: 3.2 mg/dL (ref 2.5–4.6)
Potassium: 3.1 mmol/L — ABNORMAL LOW (ref 3.5–5.1)
Sodium: 136 mmol/L (ref 135–145)

## 2020-03-04 LAB — CBC WITH DIFFERENTIAL/PLATELET
Abs Immature Granulocytes: 0.76 10*3/uL — ABNORMAL HIGH (ref 0.00–0.07)
Basophils Absolute: 0.2 10*3/uL — ABNORMAL HIGH (ref 0.0–0.1)
Basophils Relative: 1 %
Eosinophils Absolute: 0.6 10*3/uL — ABNORMAL HIGH (ref 0.0–0.5)
Eosinophils Relative: 3 %
HCT: 28.7 % — ABNORMAL LOW (ref 39.0–52.0)
Hemoglobin: 9 g/dL — ABNORMAL LOW (ref 13.0–17.0)
Immature Granulocytes: 3 %
Lymphocytes Relative: 13 %
Lymphs Abs: 3 10*3/uL (ref 0.7–4.0)
MCH: 26.1 pg (ref 26.0–34.0)
MCHC: 31.4 g/dL (ref 30.0–36.0)
MCV: 83.2 fL (ref 80.0–100.0)
Monocytes Absolute: 2 10*3/uL — ABNORMAL HIGH (ref 0.1–1.0)
Monocytes Relative: 9 %
Neutro Abs: 17.3 10*3/uL — ABNORMAL HIGH (ref 1.7–7.7)
Neutrophils Relative %: 71 %
Platelets: 346 10*3/uL (ref 150–400)
RBC: 3.45 MIL/uL — ABNORMAL LOW (ref 4.22–5.81)
RDW: 18.4 % — ABNORMAL HIGH (ref 11.5–15.5)
WBC: 23.9 10*3/uL — ABNORMAL HIGH (ref 4.0–10.5)
nRBC: 0 % (ref 0.0–0.2)

## 2020-03-04 LAB — GLUCOSE, CAPILLARY
Glucose-Capillary: 115 mg/dL — ABNORMAL HIGH (ref 70–99)
Glucose-Capillary: 133 mg/dL — ABNORMAL HIGH (ref 70–99)
Glucose-Capillary: 134 mg/dL — ABNORMAL HIGH (ref 70–99)
Glucose-Capillary: 150 mg/dL — ABNORMAL HIGH (ref 70–99)
Glucose-Capillary: 162 mg/dL — ABNORMAL HIGH (ref 70–99)
Glucose-Capillary: 179 mg/dL — ABNORMAL HIGH (ref 70–99)

## 2020-03-04 NOTE — Progress Notes (Signed)
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  Patient had recent history of Covid pneumonia and was on ventilator because he progressed to ARDS.  Because of little improvement in the ventilatory and oxygenation, he underwent tracheostomy and was discharged to [facility].  He had recent history of UTI, multidrug-resistant Pseudomonas pneumonia, stenotrophomonas bacteremia.  Because of sepsis and shock, he developed renal failure requiring dialysis.  On 5/19, he developed hemoptysis and was transferred to [facility] ED for evaluation.  PCCM,ID,Nephrology following here.  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  He also underwent tracheostomy tube change by PCCM for persistent leak.  Due to lack of insurance, he cannot go back to [place].  Social worker assisting on placement.    Assessment & Plan:   Principal Problem:   Enterococcal bacteremia Active Problems:   Acute respiratory failure (HCC)   Hemoptysis   Pressure injury of skin   Protein-calorie malnutrition, severe   Decubitus ulcer of sacral region, stage 4 (HCC)   Fever   Acute on chronic hypoxic respiratory failure: Status post trach due to respiratory failure from Covid pneumonia.  Currently on vent.  PCCM managing vent.  History of multiple episodes of pneumonia from different organisms and was treated for different antibiotics.  Most recently pneumonia has been multidrug-resistant Pseudomonas for which she was treated with Avycaz and mycin [facility]bulizers [facility].  Possible ongoing aspiration also.  Presented with hemoptysis, aspiration of blood.  Patient has cuffed trach placed by ENT.   No active bleeding on bronchoscopy.  Continue attempts at weaning vent goal is to progress to trach collar.  He also underwent tracheostomy tube change by PCCM for persistent leak on 03/01/20. Chest x-ray done on 03/03/2020 showed bilateral pleural effusions, stable extensive opacity throughout the left hemithorax. He has been put on Trach collar this morning.  Paroxysmal A. fib: Currently rate is controlled.  Anticoagulation was discontinued due to suspected GI bleed.  Currently on low-dose Eliquis.  On rate control with Cardizem.  Sepsis/bacteremia: Enterococcus and Staphylococcus epidermidis bacteremia.  Also has osteomyelitis fifth sacral segment.  Chest x-ray showed persistent bilateral lower lobe infiltrates.  High suspicion for ongoing aspiration.    Infectious disease consulted and following.  MRI pelvis/sacrum showed osteomyelitis of fifth sacral segment, edema in the muscle around both hips and proximal thighs with possible myositis.  Currently on vancomycin, ceftriaxone, Flagyl.  IR consulted for bone biopsy but due to high risk  it was not done. ID recommended treatment for 6 weeks for sacral osteomyelitis and staph epidermidis bacteremia with vancomycin, ceftriaxone 2 g IV daily and metronidazole. He has persistent  leukocytosis.  Continue to monitor CBC. He became febrile on  03/02/20, new blood cultures have been sent,NGTD.  ESRD: On dialysis per nephrology.  Underwent  tunnel catheter placement on 6/2.  Undergoing dialysis as per nephrology.  Chronic normocytic anemia: Secondary to ESRD, critical illness.  Has been transfused with PRBCs.  Currently hemoglobin stable.  Continue to monitor  Unstageable sacral pressure ulcer/osteomyelitis: Wound care following.  Discussion as above.  Cachexia/severe protein malnutrition: On tube feeds.  Nutrition following.  Mild elevated LFTs: Continue to monitor.  Disposition: Patient is from [place].  Anticipate discharge to SNF  but due to lack of  insurance, placement has been difficult.  Social working assisting    Nutrition Problem: Severe Malnutrition Etiology: chronic illness(chronic respiratory failure related to COVID ARDS)      DVT prophylaxis: Eliquis Code Status: DNR Family Communication: Called sister for update on 02/29/20 Status is: Inpatient  Remains inpatient appropriate because:Unsafe d/c plan   Dispo: The patient is from: SNF              Anticipated d/c is to: SNF              Anticipated d/c date is: > 3 days   Consultants: ID, PCCM, IR, nephrology  Procedures: ENT flex scope 5/19 > no bleeding source identified FOB 5/19 > no pulmonary bleeding source identified. 6/2: Tunnel catheter placement by IR. 6/4: Changed trach tube  Antimicrobials:  Anti-infectives (From admission, onward)   Start     Dose/Rate Route Frequency Ordered Stop   03/04/20 2000  cefTAZidime (FORTAZ) 2 g in sodium chloride 0.9 % 100 mL IVPB     2 g 200 mL/hr over 30 Minutes Intravenous Every M-W-F (2000) 03/01/20 1136     03/02/20 1200  vancomycin (VANCOCIN) IVPB 500 mg/100 ml premix     500 mg 100 mL/hr over 60 Minutes Intravenous Every T-Th-Sa (Hemodialysis) 03/01/20 1131 03/02/20 1710   03/01/20 1230  vancomycin (VANCOCIN) IVPB 500 mg/100 ml premix     500 mg 100 mL/hr over 60 Minutes Intravenous Every M-W-F (Hemodialysis) 03/01/20 1131     02/28/20 1110  vancomycin (VANCOREADY) IVPB 500 mg/100 mL     over 60 Minutes  Continuous PRN 02/28/20 1112 02/28/20 1110   02/28/20 1101  vancomycin (VANCOCIN) 1-5 GM/200ML-% IVPB  Status:  Discontinued    Note to Pharmacy: Lytle Butte   : cabinet override      02/28/20 1101 02/28/20 1327   02/27/20 1800  cefTAZidime (FORTAZ) 1 g in sodium chloride 0.9 % 100 mL IVPB     1 g 200 mL/hr over 30 Minutes Intravenous Every 24 hours 02/27/20 0947 03/03/20 1738   02/27/20 1200  vancomycin (VANCOCIN) IVPB 500 mg/100 ml premix     500 mg 100 mL/hr over 60 Minutes Intravenous Once 02/27/20  0829 02/27/20 1343   02/24/20 1800  vancomycin (VANCOREADY) IVPB 500 mg/100 mL     500 mg 100 mL/hr over 60 Minutes Intravenous  Once 02/24/20 1308 02/24/20 1905   02/22/20 2200  metroNIDAZOLE (FLAGYL) tablet 500 mg     500 mg Per Tube Every 8 hours 02/22/20 0957 04/04/20 2359   02/22/20 1700  cefTRIAXone (ROCEPHIN) 2 g in sodium chloride 0.9 % 100 mL IVPB  Status:  Discontinued     2 g 200 mL/hr over 30 Minutes Intravenous Every 24 hours 02/22/20 0957 02/27/20 0947   02/22/20 1200  vancomycin (VANCOCIN) IVPB 500 mg/100 ml premix  Status:  Discontinued     500 mg 100 mL/hr over 60 Minutes Intravenous Every T-Th-Sa (Hemodialysis) 02/20/20 1253 02/21/20 1040   02/21/20 1431  vancomycin variable dose per unstable renal function (pharmacist dosing)  Status:  Discontinued      Does not apply See admin instructions 02/21/20 1431 03/01/20 1131   02/21/20 1200  vancomycin (VANCOREADY) IVPB 500 mg/100 mL     500 mg 100 mL/hr over 60 Minutes Intravenous  Once 02/21/20 0956 02/21/20 1253   02/21/20 0714  vancomycin (VANCOCIN) 500-5 MG/100ML-% IVPB  Status:  Discontinued    Note to Pharmacy: Ashley Akin   :  cabinet override      02/21/20 0714 02/21/20 1346   02/20/20 1400  vancomycin (VANCOREADY) IVPB 1250 mg/250 mL     1,250 mg 166.7 mL/hr over 90 Minutes Intravenous  Once 02/20/20 1253 02/20/20 1538   02/20/20 1200  levofloxacin (LEVAQUIN) IVPB 500 mg  Status:  Discontinued     500 mg 100 mL/hr over 60 Minutes Intravenous Every 48 hours 02/20/20 1020 02/21/20 0943   02/15/20 1545  vancomycin (VANCOCIN) IVPB 1000 mg/200 mL premix  Status:  Discontinued     1,000 mg 200 mL/hr over 60 Minutes Intravenous Every 24 hours 02/14/20 1530 02/14/20 1745   02/15/20 0330  ceFEPIme (MAXIPIME) 2 g in sodium chloride 0.9 % 100 mL IVPB  Status:  Discontinued     2 g 200 mL/hr over 30 Minutes Intravenous Every 12 hours 02/14/20 1531 02/14/20 1531   02/14/20 1531  ceFEPIme (MAXIPIME) 2 g in sodium chloride  0.9 % 100 mL IVPB  Status:  Discontinued     2 g 200 mL/hr over 30 Minutes Intravenous Every 12 hours 02/14/20 1531 02/14/20 1745   02/14/20 1530  ceFEPIme (MAXIPIME) 2 g in sodium chloride 0.9 % 100 mL IVPB  Status:  Discontinued     2 g 200 mL/hr over 30 Minutes Intravenous  Once 02/14/20 1520 02/14/20 1531   02/14/20 1530  vancomycin (VANCOREADY) IVPB 1500 mg/300 mL  Status:  Discontinued     1,500 mg 150 mL/hr over 120 Minutes Intravenous  Once 02/14/20 1530 02/14/20 1745      Subjective:  Patient seen and examined at the bedside this morning.  Hemodynamically stable but is tachypnea with respiratory in the range of 30s.  Heart rate ranging between 100-110.  He was in trach collar.  Objective: Vitals:   03/04/20 0400 03/04/20 0402 03/04/20 0500 03/04/20 0600  BP: 96/62 96/62 101/70 128/72  Pulse: (!) 107 (!) 102 (!) 108 (!) 107  Resp: (!) 22 (!) 24 (!) 22 18  Temp:      TempSrc:      SpO2: 100% 100% 100% 100%  Weight:   59.5 kg   Height:        Intake/Output Summary (Last 24 hours) at 03/04/2020 0736 Last data filed at 03/04/2020 0600 Gross per 24 hour  Intake 1238.59 ml  Output 500 ml  Net 738.59 ml   Filed Weights   03/02/20 1723 03/03/20 0500 03/04/20 0500  Weight: 60 kg 57.5 kg 59.5 kg    Examination:  General exam: Not in distress, deconditioned, debilitated HEENT: Trach Respiratory system: Tachypnea, no wheezing or crackles  cardiovascular system: Afib  No JVD, murmurs, rubs, gallops or clicks. Gastrointestinal system: Abdomen is nondistended, soft and nontender. No organomegaly or masses felt. Normal bowel sounds heard.  PEG, colostomy Central nervous system: Alert and awake  extremities: No edema, no clubbing ,no cyanosis Skin: No rashes, lesions or ulcers,no icterus ,no pallor     Data Reviewed: I have personally reviewed following labs and imaging studies  CBC: Recent Labs  Lab 02/29/20 0230 02/29/20 0230 03/01/20 0355 03/02/20 0244  03/03/20 0257 03/03/20 1032 03/04/20 0220  WBC 17.2*  --  21.5* 20.1* 21.9*  --  23.9*  NEUTROABS 11.6*  --  15.4* 14.5* 16.7*  --  17.3*  HGB 8.8*   < > 9.1* 9.4* 8.5* 9.5* 9.0*  HCT 27.9*   < > 29.2* 29.9* 27.1* 28.0* 28.7*  MCV 83.0  --  82.7 82.4 82.1  --  83.2  PLT 254  --  289 305 306  --  346   < > = values in this interval not displayed.   Basic Metabolic Panel: Recent Labs  Lab 02/27/20 0316 02/27/20 0316 02/28/20 0234 02/28/20 0234 02/29/20 0230 03/01/20 0355 03/02/20 0244 03/03/20 0257 03/03/20 1032  NA 141   < > 139   < > 139 136 137 135 138  K 3.9   < > 3.6   < > 3.9 3.4* 3.6 3.9 3.8  CL 98   < > 98  --  99 99 98 97*  --   CO2 22   < > 22  --  _0 --   GLUCOSE 131*   < > 144*  --  134* 156* 130* 144*  --   BUN 135*   < > 120*  --  151* 59* 105* 53*  --   CREATININE 4.73*   < > 4.22*  --  4.97* 2.47* 3.37* 1.93*  --   CALCIUM 10.2   < > 9.7  --  9.9 9.2 10.0 9.2  --   PHOS 5.8*  --   --   --   --   --   --   --   --    < > = values in this interval not displayed.   GFR: Estimated Creatinine Clearance: 29.1 mL/min (A) (by C-G formula based on SCr of 1.93 mg/dL (H)). Liver Function Tests: Recent Labs  Lab 02/27/20 0316 02/28/20 0234  AST  --  39  ALT  --  47*  ALKPHOS  --  233*  BILITOT  --  0.6  PROT  --  8.2*  ALBUMIN 2.3* 2.3*   No results for input(s): LIPASE, AMYLASE in the last 168 hours. No results for input(s): AMMONIA in the last 168 hours. Coagulation Profile: No results for input(s): INR, PROTIME in the last 168 hours. Cardiac Enzymes: No results for input(s): CKTOTAL, CKMB, CKMBINDEX, TROPONINI in the last 168 hours. BNP (last 3 results) No results for input(s): PROBNP in the last 8760 hours. HbA1C: No results for input(s): HGBA1C in the last 72 hours. CBG: Recent Labs  Lab 03/03/20 1622 03/03/20 1945 03/03/20 2353 03/04/20 0421 03/04/20 0716  GLUCAP 130* 138* 171* 162* 150*   Lipid Profile: No results for input(s):  CHOL, HDL, LDLCALC, TRIG, CHOLHDL, LDLDIRECT in the last 72 hours. Thyroid Function Tests: No results for input(s): TSH, T4TOTAL, FREET4, T3FREE, THYROIDAB in the last 72 hours. Anemia Panel: No results for input(s): VITAMINB12, FOLATE, FERRITIN, TIBC, IRON, RETICCTPCT in the last 72 hours. Sepsis Labs: No results for input(s): PROCALCITON, LATICACIDVEN in the last 168 hours.  Recent Results (from the past 240 hour(s))  Culture, blood (routine x 2)     Status: None (Preliminary result)   Collection Time: 03/03/20  9:13 AM   Specimen: BLOOD RIGHT HAND  Result Value Ref Range Status   Specimen Description BLOOD RIGHT HAND  Final   Special Requests AEROBIC BOTTLE ONLY Blood Culture adequate volume  Final   Culture   Final    NO GROWTH < 24 HOURS Performed at [facility], [address] [place], [place] [address]    Report Status PENDING  Incomplete  Culture, blood (routine x 2)     Status: None (Preliminary result)   Collection Time: 03/03/20  9:13 AM   Specimen: BLOOD LEFT HAND  Result Value Ref Range Status   Specimen Description BLOOD LEFT HAND  Final   Special Requests AEROBIC BOTTLE ONLY Blood Culture adequate volume  Final   Culture   Final    NO GROWTH < 24 HOURS Performed at [facility], [place] [address] [place], [place] [address]    Report Status PENDING  Incomplete         Radiology Studies: DG Chest Port 1 View  Result Date: 03/03/2020 CLINICAL DATA:  Acute respiratory failure EXAM: PORTABLE CHEST 1 VIEW COMPARISON:  Radiograph 02/29/2020, CT 12/21/2019 FINDINGS: Tracheostomy tube terminates in the mid trachea. Dual lumen dialysis catheter tip terminates in the right atrium. Telemetry leads overlie the chest. Persistent bilateral pleural effusions, left greater than right with extensive opacity throughout the left hemithorax. More coarse interstitial opacities throughout the right lung are similar to slightly improved from prior. No pneumothorax.  Cardiomediastinal contours are stable with a calcified aorta. No acute osseous or soft tissue abnormality. IMPRESSION: 1. Bilateral pleural effusions, left greater than right. 2. Extensive stable opacity throughout the left hemithorax. 3. More coarse interstitial opacities throughout the right lung are similar to slightly improved. Electronically Signed   By: Lovena Le M.D.   On: 03/03/2020 00:27        Scheduled Meds: . apixaban  2.5 mg Per Tube BID  . chlorhexidine gluconate (MEDLINE KIT)  15 mL Mouth Rinse BID  . Chlorhexidine Gluconate Cloth  6 each Topical Q0600  . docusate  100 mg Per Tube BID  . feeding supplement (PRO-STAT SUGAR FREE 64)  30 mL Per Tube BID  . insulin aspart  0-6 Units Subcutaneous Q4H  . insulin aspart  2 Units Subcutaneous Q4H  . levothyroxine  25 mcg Per Tube Q0600  . mouth rinse  15 mL Mouth Rinse 10 times per day  . metroNIDAZOLE  500 mg Per Tube Q8H  . nutrition supplement (JUVEN)  1 packet Per Tube BID BM  . pantoprazole sodium  40 mg Per Tube Daily  . polyethylene glycol  17 g Per Tube Daily  . vancomycin  500 mg Intravenous Q M,W,F-HD   Continuous Infusions: . sodium chloride    . sodium chloride    . sodium chloride 10 mL/hr at 03/04/20 0500  . cefTAZidime (FORTAZ)  IV    . feeding supplement (NEPRO CARB STEADY) 45 mL/hr at 03/04/20 0600     LOS: 19 days    Time spent: 35 mins.More than 50% of that time was spent in counseling and/or coordination of care.      Shelly Coss, MD [facility] P6/03/2020, 7:36 AM

## 2020-03-04 NOTE — Progress Notes (Addendum)
1:25pm: CSW received return call from [place] at [facility] who states the facility cannot accomodate that patient due to his HD needs.  10:25am: CSW attempted to reach admissions at [place] was left requesting a return call.  Madilyn Fireman, MSW, LCSW-A Transitions of Care  Clinical Social Worker  [facility]  Medical ICU [phone]

## 2020-03-04 NOTE — Progress Notes (Signed)
[facility] Progress Note  Subjective: no new issues on TC this AM  Vitals:   03/04/20 0800 03/04/20 0803 03/04/20 0806 03/04/20 0900  BP: 127/77   123/77  Pulse: (!) 103 (!) 105  (!) 106  Resp: (!) 39 (!) 26  (!) 42  Temp:   98.4 F (36.9 C)   TempSrc:   Oral   SpO2: 100% 100%  100%  Weight:      Height:        Exam:   In ICU on TC, awake, ^wob with RR in 40s, ABG pending  no jvd  Chest cta bilat ant/ lat  Cor reg no RG  Abd soft ntnd no ascites, +llq ostomy, + peg tube   Ext 1+ hip edema bilat   Neuro: as above     Summary: came from [facility] to [facility] after [place] hospitalization which included mech ventilation and trach placement. At [facility] had severe infections/ bacteremia/ PNA and developed AKI felt due to IV gent toxicity requiring initiation of HD 12/16/19. Admitted to ICU [facility] for trach bleed on 5/19. Was getting MWF HD at [facility] prior to admit here.   OP HD: started March 2021 at [facility] as above MWF   Getting HD here 3.5- 4h, UF 0.5- 1.5 L, hep 2000 prn   admit 62.8kg > range 56.9- 64.7kg here  Assessment/ Plan: 1. 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 On IV abx. Get volume down as tol w/ HD. Poor prognosis per CCM. 2. Enterococcus/ MRSE bacteremia HD cath sepsis - TDC removed 5/26, per ID getting 6 wks vanc/ fortaz/ flagyl for polymicrobial sepsis and osteomyelitis.  3. Sacral osteo/ decub stage IV - per ID, also abx as above 4. ESRD/ AKI - on HD since mid March 2021, d/t gent toxicity. HD MWF at [facility]. SP new TDC 6/2 by IR.  HD today, UF as tol, not significantly overloaded.  Plan next HD Wednesday. 5. Trach bleed - resolved 6. Tracheobronchitis - sp course of doxycycline 7. AMS - in and out, ABG pending today to eval if CO2 retention 8. Atrial fib - per primary 9. Anemia ckd - transfuse prn 10. Hypercalcemia:  w/ low PTH, prob from immobility, corrCa 11.0 > 10.5>10.3, improved. Cont low 2.0 Ca++ bath ,  PTH 29 11. BP/vol - no sig vol excess, down 4kg from admit, no vol^ on exam 12. SP peg/ divert colostomy 13. SP COVID pna - original problem in Feb at OSH 14. DNR   Jannifer Hick MD [facility] Pager [phone]   Recent Labs  Lab 02/27/20 [phone] 02/27/20 0705 03/02/20 0244 03/02/20 0244 03/03/20 0257 03/03/20 0257 03/03/20 1032 03/04/20 0220  K 3.9   < > 3.6   < > 3.9  --  3.8  --   BUN 135*   < > 105*  --  53*  --   --   --   CREATININE 4.73*   < > 3.37*  --  1.93*  --   --   --   CALCIUM 10.2   < > 10.0  --  9.2  --   --   --   PHOS 5.8*  --   --   --   --   --   --   --   HGB 9.2*   < > 9.4*   < > 8.5*   < > 9.5* 9.0*   < > = values in this  interval not displayed.   Inpatient medications: . apixaban  2.5 mg Per Tube BID  . chlorhexidine gluconate (MEDLINE KIT)  15 mL Mouth Rinse BID  . Chlorhexidine Gluconate Cloth  6 each Topical Q0600  . docusate  100 mg Per Tube BID  . feeding supplement (PRO-STAT SUGAR FREE 64)  30 mL Per Tube BID  . insulin aspart  0-6 Units Subcutaneous Q4H  . insulin aspart  2 Units Subcutaneous Q4H  . levothyroxine  25 mcg Per Tube Q0600  . mouth rinse  15 mL Mouth Rinse 10 times per day  . metroNIDAZOLE  500 mg Per Tube Q8H  . nutrition supplement (JUVEN)  1 packet Per Tube BID BM  . pantoprazole sodium  40 mg Per Tube Daily  . polyethylene glycol  17 g Per Tube Daily  . vancomycin  500 mg Intravenous Q M,W,F-HD   . sodium chloride    . sodium chloride    . sodium chloride 10 mL/hr at 03/04/20 0800  . cefTAZidime (FORTAZ)  IV    . feeding supplement (NEPRO CARB STEADY) 45 mL/hr at 03/04/20 0600   sodium chloride, sodium chloride, sodium chloride, acetaminophen (TYLENOL) oral liquid 160 mg/5 mL, alteplase, fentaNYL (SUBLIMAZE) injection, heparin, heparin, lidocaine (PF), lidocaine-prilocaine, midazolam, midazolam, pentafluoroprop-tetrafluoroeth

## 2020-03-04 NOTE — Progress Notes (Signed)
NAME:  Shane Banks, MRN:  [id number], DOB:  19-Oct-1945, LOS: 29 ADMISSION DATE:  02/14/2020, CONSULTATION DATE:  5/19 REFERRING MD:  Dr. Alvino Chapel, CHIEF COMPLAINT:  Hemoptysis   Brief History   74 yo m with PMHx of afib with RVR, BPH, stroke, and COVID ARDS in January requiring trach and LTACH placement at [facility]. Had been tolerating trach collar, but on 5/19 developed hemoptysis and hypoxemic respiratory failure requiring vent. Transferred to [facility] on 5/19.  Past Medical History   has a past medical history of Acute on chronic respiratory failure with hypoxia ([place]), Atrial fibrillation with RVR ([place]), BPH (benign prostatic hyperplasia), COVID-19 virus infection, Pneumonia due to COVID-19 virus, Severe sepsis ([place]), and Stroke ([place]).  Significant Hospital Events   1/8  Admit to [facility] for COVID PNA 2/23 Transfer to [facility] on vent 5/19 Transfer to [facility] for hemoptysis. #6 cuffed trach placed. Back on vent. Bronch performed 5/20 Transitioned to trach collar.  5/22 back on vent full time 5/24 back on antibiotics for fevers 6/1 Initiating PSV weans 6/3 Large cuff leak, trach exchange  Consults:  ENT Nephrology ID PCCM  Procedures:  ENT flex scope 5/19 > no bleeding source identified FOB 5/19 > no pulmonary bleeding source identified.   Significant Diagnostic Tests:  CXR 5/28 >> R CVC placement confirmed Slightly improved aeration at the right base. 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 Edema in the muscles around the hips and in the posterior paraspinal musculature of the lower lumbar spine and in the buttocks which could represent myositis. Fluid-fluid level in the bladder may represent protein or debris in the bladder. The possibility of urinary tract infection should  be considered.  CXR 6/3> improved R lung aeration, worsening L sided opacity. Tracheostomy tube, HD catheter.   CXR 6/4>wosened R lung infiltrates and stable L sided infiltrate. Possible bilateral pleural effusions  Micro Data:    tracheal aspirate 5/7> for Pseudomonas A  BAL 5/19 > no growth 5/19 blood > NG 5/22 trach aspirate>> candida parasipolis 5/22 blood>> 1/4 GPC> staph epi 5/25 blood>> GPC (enterococcus on biofire) 2/4 bottles>> E. Faecalis, staph epi. 5/25 trach aspirate>>many PMN, few GPR, rare GPC> diphtheroids 5/25 blood cx>> + for Enterococcus Faecalis (gent resistant)/ Staphylococcus Epidermidis (tetracycline, vanc, rifampin sensitive) 5/27 BCx> ngtd    Antimicrobials:  Levofloxacin 5/24>5/26 vanc 5/25> Ceftriaxone  5/27>6/1 Metronidazole 5/27>  [name] 6/1>  Interim history/subjective:  No events. Denies pain. No hemoptysis.  Objective   Blood pressure 117/71, pulse 99, temperature 98.5 F (36.9 C), resp. rate (!) 24, height _0  (1.753 m), weight 59.5 kg, SpO2 100 %.    Vent Mode: PRVC FiO2 (%):  [30 %] 30 % Set Rate:  [15 bmp] 15 bmp Vt Set:  [480 mL] 480 mL PEEP:  [5 cmH20] 5 cmH20 Pressure Support:  [30 cmH20] 30 cmH20 Plateau Pressure:  [31 cmH20-33 cmH20] 33 cmH20   Intake/Output Summary (Last 24 hours) at 03/04/2020 0757 Last data filed at 03/04/2020 0600 Gross per 24 hour  Intake 1238.59 ml  Output 500 ml  Net 738.59 ml   Filed Weights   03/02/20 1723 03/03/20 0500 03/04/20 0500  Weight: 60 kg 57.5 kg 59.5 kg    Examination:   GEN: chronically ill man on vent HEENT: trach in place, minimal secretions CV: RRR, ext warm PULM:  tachypneic, coarse rhonci bilaterally, + accessory muscle use GI: Soft, +BS, PEG and ostomy in place EXT: +muscle wasting, no edema NEURO: moves all 4 ext but weak PSYCH: RASS 0 SKIN: No rashes  Stable leukocytosis Stable CXR Mild fever  Resolved Hospital Problem list     Assessment & Plan:   Acute on  chronic hypoxemic respiratory failure requiring tracheostomy, 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  Apparently was able to tolerate ATC in past although I am having a hard time believing this.  His lung compliance is terrible as is his inspiratory strength. - Will try him on trach collar and see what happens, if depressed mental status will check ABG to confirm its from respiratory insufficiency. - Abx per primary team - Fluid removal as much as tolerated by BP - Will follow with you   Other issues Enterococcus/ MRSE bacteremia HD cath sepsis  Sacral osteo/ decub stage IV  Afib S/p PEG, diverting ostomy  Best practice:  Per primary team   Erskine Emery MD PCCM

## 2020-03-04 NOTE — Progress Notes (Signed)
Pharmacy Antibiotic Note  Shane Banks is a 74 y.o. 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 Line pulled 5/26, line holiday and had it replaced 5/28. TTE negative. Repeat BCx are negative. ID planning 6 weeks of therapy  Patient was not scheduled for HD on 6/2 but received vancomycin 1g IV dose per IR physician. Subsequent vancomycin random level was supratherapeutic at 36 mcg/mL. The patient's Vancomycin was held after HD on 6/3 and resumed with HD on 6/5.   Plan: Continue Vancomycin 500 mg qHD MWF Continue Fortaz 2gm IV qHD MWF @ 2000 Flagyl 500mg  PT Q8H per MD Monitor HD schedule/tolerance, vanc level PRN Abx end date - 04/04/20 per ID   Height: 5\' 9"  (175.3 cm) Weight: (P) 59.5 kg (131 lb 2.8 oz) IBW/kg (Calculated) : 70.7  Temp (24hrs), Avg:98.4 F (36.9 C), Min:98 F (36.7 C), Max:99.3 F (37.4 C)  Recent Labs  Lab 02/28/20 0234 02/28/20 0234 02/29/20 0230 03/01/20 0355 03/02/20 0244 03/03/20 0257 03/04/20 0220  WBC 16.2*   < > 17.2* 21.5* 20.1* 21.9* 23.9*  CREATININE 4.22*  --  4.97* 2.47* 3.37* 1.93*  --   VANCORANDOM  --   --  36  --   --   --   --    < > = values in this interval not displayed.    Estimated Creatinine Clearance: 29.1 mL/min (A) (by C-G formula based on SCr of 1.93 mg/dL (H)).    Allergies  Allergen Reactions  . Aspirin Rash  . Cephalexin Other (See Comments)    dizziness  . Penicillins     Itching,swelling     5/19 Cefepime>>5/19 5/19 Vanc>>5/19; 5/25>>7/8 5/27 CTX >> 6/1 5/27 flagyl >>7/8 6/1 ceftaz >>7/8  5/31 VR = 21 mcg/mL 6/3 VR = 36 (1gm in IR w/o HD) >> estimated 20 mcg/mL post HD >> held w/ HD 6/3, resumed 6/5  Thank you for allowing pharmacy to be a part of this patient's care.  Alycia Rossetti, PharmD, BCPS Clinical Pharmacist Clinical phone for 03/04/2020: [phone] 03/04/2020 1:32 PM   **Pharmacist phone directory  can now be found on [email or web address] ([email or web address]).  Listed under [place].

## 2020-03-05 DIAGNOSIS — Z515 Encounter for palliative care: Secondary | ICD-10-CM

## 2020-03-05 DIAGNOSIS — Z7189 Other specified counseling: Secondary | ICD-10-CM

## 2020-03-05 DIAGNOSIS — Z66 Do not resuscitate: Secondary | ICD-10-CM

## 2020-03-05 LAB — CBC WITH DIFFERENTIAL/PLATELET
Abs Immature Granulocytes: 0.8 10*3/uL — ABNORMAL HIGH (ref 0.00–0.07)
Basophils Absolute: 0.1 10*3/uL (ref 0.0–0.1)
Basophils Relative: 1 %
Eosinophils Absolute: 0.7 10*3/uL — ABNORMAL HIGH (ref 0.0–0.5)
Eosinophils Relative: 3 %
HCT: 28 % — ABNORMAL LOW (ref 39.0–52.0)
Hemoglobin: 8.6 g/dL — ABNORMAL LOW (ref 13.0–17.0)
Immature Granulocytes: 4 %
Lymphocytes Relative: 12 %
Lymphs Abs: 2.7 10*3/uL (ref 0.7–4.0)
MCH: 25.7 pg — ABNORMAL LOW (ref 26.0–34.0)
MCHC: 30.7 g/dL (ref 30.0–36.0)
MCV: 83.6 fL (ref 80.0–100.0)
Monocytes Absolute: 1.8 10*3/uL — ABNORMAL HIGH (ref 0.1–1.0)
Monocytes Relative: 8 %
Neutro Abs: 16.6 10*3/uL — ABNORMAL HIGH (ref 1.7–7.7)
Neutrophils Relative %: 72 %
Platelets: 312 10*3/uL (ref 150–400)
RBC: 3.35 MIL/uL — ABNORMAL LOW (ref 4.22–5.81)
RDW: 18.4 % — ABNORMAL HIGH (ref 11.5–15.5)
WBC: 22.8 10*3/uL — ABNORMAL HIGH (ref 4.0–10.5)
nRBC: 0 % (ref 0.0–0.2)

## 2020-03-05 LAB — GLUCOSE, CAPILLARY
Glucose-Capillary: 138 mg/dL — ABNORMAL HIGH (ref 70–99)
Glucose-Capillary: 142 mg/dL — ABNORMAL HIGH (ref 70–99)
Glucose-Capillary: 158 mg/dL — ABNORMAL HIGH (ref 70–99)
Glucose-Capillary: 151 mg/dL — ABNORMAL HIGH (ref 70–99)
Glucose-Capillary: 147 mg/dL — ABNORMAL HIGH (ref 70–99)
Glucose-Capillary: 84 mg/dL (ref 70–99)

## 2020-03-05 LAB — BASIC METABOLIC PANEL
Anion gap: 9 (ref 5–15)
BUN: 51 mg/dL — ABNORMAL HIGH (ref 8–23)
CO2: 28 mmol/L (ref 22–32)
Calcium: 9.2 mg/dL (ref 8.9–10.3)
Chloride: 98 mmol/L (ref 98–111)
Creatinine, Ser: 1.83 mg/dL — ABNORMAL HIGH (ref 0.61–1.24)
GFR calc Af Amer: 42 mL/min — ABNORMAL LOW (ref >60)
GFR calc non Af Amer: 36 mL/min — ABNORMAL LOW (ref >60)
Glucose, Bld: 139 mg/dL — ABNORMAL HIGH (ref 70–99)
Potassium: 3.4 mmol/L — ABNORMAL LOW (ref 3.5–5.1)
Sodium: 135 mmol/L (ref 135–145)

## 2020-03-05 MED ORDER — NEPRO/CARBSTEADY PO LIQD
1000.0000 mL | ORAL | Status: DC
  Administered 2020-03-06 – 2020-03-17 (×9): 1000 mL
  Filled 2020-03-05 (×19): qty 1000

## 2020-03-05 MED ORDER — HEPARIN SODIUM (PORCINE) 1000 UNIT/ML DIALYSIS
2000.0000 [IU] | INTRAMUSCULAR | Status: DC | PRN
  Filled 2020-03-05: qty 2

## 2020-03-05 MED ORDER — POTASSIUM CHLORIDE CRYS ER 20 MEQ PO TBCR
20.0000 meq | EXTENDED_RELEASE_TABLET | Freq: Once | ORAL | Status: AC
  Administered 2020-03-05: 20 meq via ORAL
  Filled 2020-03-05: qty 1

## 2020-03-05 MED ORDER — IPRATROPIUM-ALBUTEROL 0.5-2.5 (3) MG/3ML IN SOLN
RESPIRATORY_TRACT | Status: AC
  Filled 2020-03-05: qty 3

## 2020-03-05 MED ORDER — IPRATROPIUM-ALBUTEROL 0.5-2.5 (3) MG/3ML IN SOLN
3.0000 mL | Freq: Four times a day (QID) | RESPIRATORY_TRACT | Status: DC
  Administered 2020-03-05 – 2020-03-13 (×33): 3 mL via RESPIRATORY_TRACT
  Filled 2020-03-05 (×34): qty 3

## 2020-03-05 MED ORDER — HEPARIN SODIUM (PORCINE) 1000 UNIT/ML DIALYSIS
2000.0000 [IU] | INTRAMUSCULAR | Status: DC | PRN
  Administered 2020-03-07: 2000 [IU] via INTRAVENOUS_CENTRAL
  Filled 2020-03-05: qty 2

## 2020-03-05 NOTE — Progress Notes (Signed)
Patients sister called and was updated on how the patient did throughout the night.

## 2020-03-05 NOTE — Progress Notes (Signed)
NAME:  Shane Banks, MRN:  [id number], DOB:  03-Apr-1946, LOS: 45 ADMISSION DATE:  02/14/2020, CONSULTATION DATE:  5/19 REFERRING MD:  Dr. Alvino Chapel, CHIEF COMPLAINT:  Hemoptysis   Brief History   74 yo m with PMHx of afib with RVR, BPH, stroke, and COVID ARDS in January requiring trach and LTACH placement at [facility]. Had been tolerating trach collar, but on 5/19 developed hemoptysis and hypoxemic respiratory failure requiring vent. Transferred to [facility] on 5/19.  Past Medical History   has a past medical history of Acute on chronic respiratory failure with hypoxia ([place]), Atrial fibrillation with RVR ([place]), BPH (benign prostatic hyperplasia), COVID-19 virus infection, Pneumonia due to COVID-19 virus, Severe sepsis ([place]), and Stroke ([place]).  Significant Hospital Events   1/8  Admit to [facility] for COVID PNA 2/23 Transfer to [facility] on vent 5/19 Transfer to [facility] for hemoptysis. #6 cuffed trach placed. Back on vent. Bronch performed 5/20 Transitioned to trach collar.  5/22 back on vent full time 5/24 back on antibiotics for fevers 6/1 Initiating PSV weans 6/3 Large cuff leak, trach exchange  Consults:  ENT Nephrology ID PCCM  Procedures:  ENT flex scope 5/19 > no bleeding source identified FOB 5/19 > no pulmonary bleeding source identified.   Significant Diagnostic Tests:  CXR 5/28 >> R CVC placement confirmed Slightly improved aeration at the right base. 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 Edema in the muscles around the hips and in the posterior paraspinal musculature of the lower lumbar spine and in the buttocks which could represent myositis. Fluid-fluid level in the bladder may represent protein or debris in the bladder. The possibility of urinary tract infection should  be considered.  CXR 6/3> improved R lung aeration, worsening L sided opacity. Tracheostomy tube, HD catheter.   CXR 6/4>wosened R lung infiltrates and stable L sided infiltrate. Possible bilateral pleural effusions  Micro Data:    tracheal aspirate 5/7> for Pseudomonas A  BAL 5/19 > no growth 5/19 blood > NG 5/22 trach aspirate>> candida parasipolis 5/22 blood>> 1/4 GPC> staph epi 5/25 blood>> GPC (enterococcus on biofire) 2/4 bottles>> E. Faecalis, staph epi. 5/25 trach aspirate>>many PMN, few GPR, rare GPC> diphtheroids 5/25 blood cx>> + for Enterococcus Faecalis (gent resistant)/ Staphylococcus Epidermidis (tetracycline, vanc, rifampin sensitive) 5/27 BCx> ngtd    Antimicrobials:  Levofloxacin 5/24>5/26 vanc 5/25> Ceftriaxone  5/27>6/1 Metronidazole 5/27>  [name] 6/1>  Interim history/subjective:  Doing great on TC!  Denies pain.  Has some small thick secretions.  Objective   Blood pressure (!) 103/59, pulse (!) 108, temperature 99.6 F (37.6 C), temperature source Oral, resp. rate (!) 34, height _0  (1.753 m), weight 56.1 kg, SpO2 100 %.    Vent Mode: PRVC FiO2 (%):  [28 %-35 %] 28 % Set Rate:  [15 bmp] 15 bmp Vt Set:  [480 mL] 480 mL PEEP:  [5 cmH20] 5 cmH20   Intake/Output Summary (Last 24 hours) at 03/05/2020 0721 Last data filed at 03/04/2020 2200 Gross per 24 hour  Intake 1158.83 ml  Output 1850 ml  Net -691.17 ml   Filed Weights   03/04/20 1250 03/04/20 1637 03/05/20 0348  Weight: 59.5 kg 58 kg 56.1 kg    Examination:   GEN: chronically ill man on vent HEENT: trach in place, minimal secretions CV: RRR, ext warm PULM: tachypneic, coarse  rhonci bilaterally, + accessory muscle use GI: Soft, +BS, PEG and ostomy in place EXT: +muscle wasting, no edema NEURO: moves all 4 ext but weak PSYCH: RASS 0 SKIN: No rashes  Stable leukocytosis Stable CXR Blood cx ngtd No fever  Resolved Hospital Problem list     Assessment & Plan:   Acute on chronic  hypoxemic respiratory failure requiring tracheostomy, 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  Apparently was able to tolerate ATC in past although I am having a hard time believing this.  His lung compliance is terrible as is his inspiratory strength. - Continue indefinite TC - Duonebs + CPT - Abx per primary team - Fluid removal as much as tolerated by BP - Okay for progressive - Will follow with you  Other issues Enterococcus/ MRSE bacteremia HD cath sepsis  Sacral osteo/ decub stage IV  Afib S/p PEG, diverting ostomy  Best practice:  Per primary team   Erskine Emery MD PCCM

## 2020-03-05 NOTE — Progress Notes (Signed)
[facility] Progress Note  Subjective: doing well on TC per PCCM - plan to transfer to SDU.  I/Os yesterday 1.1 / 1.5 with HD UF  Vitals:   03/05/20 0500 03/05/20 0600 03/05/20 0700 03/05/20 0830  BP: (!) 113/58 (!) 103/59  110/76  Pulse: (!) 50 (!) 108  (!) 108  Resp: (!) 51 (!) 34  (!) 40  Temp:   98.8 F (37.1 C)   TempSrc:   Axillary   SpO2: 99% 100%  100%  Weight:      Height:        Exam:   In ICU on TC, awake and will mouth words  no jvd  Chest cta bilat ant/ lat  Cor reg no RG  Abd soft ntnd no ascites, +llq ostomy, + peg tube   Ext  Minor edema   Neuro: as above     Summary: came from [facility] to [facility] after COVID hospitalization which included mech ventilation and trach placement. At [facility] had severe infections/ bacteremia/ PNA and developed AKI felt due to IV gent toxicity requiring initiation of HD 12/16/19. Admitted to ICU [facility] for trach bleed on 5/19. Was getting MWF HD at [facility] prior to admit here.   OP HD: started March 2021 at [facility] as above MWF   Getting HD here 3.5- 4h, UF 0.5- 1.5 L, hep 2000 prn   admit 62.8kg > range 56.9- 64.7kg here  Assessment/ Plan: 1. 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 On IV abx. Get volume down as tol w/ HD. Doing fairly well on TC now.  2. Enterococcus/ MRSE bacteremia HD cath sepsis - TDC removed 5/26, per ID getting 6 wks vanc/ fortaz/ flagyl for polymicrobial sepsis and osteomyelitis.  3. Sacral osteo/ decub stage IV - per ID, also abx as above 4. ESRD/ AKI - on HD since mid March 2021, d/t gent toxicity. HD MWF at [facility]. S/P new TDC 6/2 by IR.  HD today, UF as tol, not significantly overloaded.  Plan next HD Wednesday. 5. Trach bleed - resolved 6. Tracheobronchitis - sp course of doxycycline 7. AMS - variable, seems overall improved 8. Atrial fib - per primary, on eliquis 2.5 BID 9. Anemia ckd - transfuse prn 10. Hypercalcemia:  w/ low PTH, prob from  immobility, corrCa 11.0 > 10.5>10.3, improved. Cont low 2.0 Ca++ bath , PTH 29 11. BP/vol - no sig vol excess, down almost 9kg from admit, no vol^ on exam 12. SP peg/ divert colostomy 13. SP COVID pna - original problem in Feb at OSH 14. DNR, poor prognosis, LTACH only option for disposition given needs   Jannifer Hick MD [facility] Pager [phone]   Recent Labs  Lab 03/03/20 0257 03/04/20 0220 03/04/20 1255 03/05/20 0253  K   < >  --  3.1* 3.4*  BUN   < >  --  117* 51*  CREATININE   < >  --  3.37* 1.83*  CALCIUM   < >  --  9.8 9.2  PHOS  --   --  3.2  --   HGB  --  9.0*  --  8.6*   < > = values in this interval not displayed.   Inpatient medications: . apixaban  2.5 mg Per Tube BID  . chlorhexidine gluconate (MEDLINE KIT)  15 mL Mouth Rinse BID  . Chlorhexidine Gluconate Cloth  6 each Topical Q0600  . docusate  100 mg Per Tube BID  .  feeding supplement (PRO-STAT SUGAR FREE 64)  30 mL Per Tube BID  . insulin aspart  0-6 Units Subcutaneous Q4H  . insulin aspart  2 Units Subcutaneous Q4H  . ipratropium-albuterol  3 mL Nebulization QID  . ipratropium-albuterol      . levothyroxine  25 mcg Per Tube Q0600  . mouth rinse  15 mL Mouth Rinse 10 times per day  . metroNIDAZOLE  500 mg Per Tube Q8H  . nutrition supplement (JUVEN)  1 packet Per Tube BID BM  . pantoprazole sodium  40 mg Per Tube Daily  . polyethylene glycol  17 g Per Tube Daily  . potassium chloride  20 mEq Oral Once  . vancomycin  500 mg Intravenous Q M,W,F-HD   . sodium chloride    . sodium chloride    . sodium chloride Stopped (03/04/20 1524)  . cefTAZidime (FORTAZ)  IV Stopped (03/04/20 2110)  . feeding supplement (NEPRO CARB STEADY) 1,000 mL (03/04/20 2045)   sodium chloride, sodium chloride, sodium chloride, acetaminophen (TYLENOL) oral liquid 160 mg/5 mL, alteplase, fentaNYL (SUBLIMAZE) injection, heparin, heparin, [START ON 03/06/2020] heparin, [START ON 03/06/2020] heparin, lidocaine (PF),  lidocaine-prilocaine, midazolam, midazolam, pentafluoroprop-tetrafluoroeth

## 2020-03-05 NOTE — Consult Note (Addendum)
Palliative Medicine Inpatient Consult Note  Reason for consult:  Goals of Care conversations "Chronic respiratory failure from Covid.Unlikely to get better"  HPI:  Per intake H&P --> 74 yo m with PMHx of afib with RVR, BPH, stroke, and COVID ARDS in January requiring trach and LTACH placement at [facility]. Had been tolerating trach collar, but on 5/19 developed hemoptysis and hypoxemic respiratory failure requiring vent. Transferred to [facility] on 5/19.  Patient has been at [place] since January.  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 Patient is bed-bound and has been so for the past six months causing a notable sacral decubitus ulcerations leading to OM. 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   Clinical Assessment/Goals of Care: I have reviewed medical records including EPIC notes, labs and imaging, received report from bedside RN, assessed the patient. Patient was noted to be in bed on trach collar. Can respond by nodding head.    I called [name] to further discuss diagnosis prognosis, GOC, EOL wishes, disposition and options.   I introduced Palliative Medicine as specialized medical care for people living with serious illness. It focuses on providing relief from the symptoms and stress of a serious illness. The goal is to improve quality of life for both the patient and the family.  I asked [name] to tell me about Aundre. She shares that he is from [place], [place]. He is not married though had a long time girlfriend. 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 He was fully independent until about eight years ago when he suffered a stroke. After this he was still able to  accommodate his basic needs though more slowly. He had been doing well living with his girlfriend until December when he was hospitalized at [facility] in the setting of COVID-19 PNA. He had recovered from that then got re-hospitalized in January due to COVID-19 pneumonia recurrence leading to intubation --> tracheostomy and g-tube placement. Since February he has been cared for by [facility] without making great improvements. As of March he became HID dependent.   I asked [name] if she feels that this is the life that [name] would want for himself, being confined to his hospital bed. She said that they had never had these types of conversations. She shares that she is his HCPOA and her family and she have decided that this is the best course of action. I discussed with her medical futility which she was not familiar with. We talked about goals for Tanor which at the present are to get to another facility. I asked [name] if she feels Quintell would want to spend the rest of his life in a facility. She shares that she does not think that he would but does not see a feasible way to come home.I asked what her families hopes are for [place]. She stated that the family would like him to gain strength and improve. I told her that he will likely still suffer all of these complications moving forward such as worsening ulcers, recurrent pneumonias, increased frailty in the setting of muscular deconditioning. I asked her if she feels that he would want a life going back and forth to dialysis. She  shared that she truly did not know.   I was able to be very candidate with [name] about [name]'s poor prognosis moving forward. I shared that it is time to meet collectively to readdress goals of care. We plan to have a family meeting later in the week.  Bradie is being assessed for a Passy muir valve by speech therapy. It would truly be invaluable to gain his insights on the present situation with his family present.    Concepts specific to code status, patient is a DNR.   Discussed the importance of continued conversation with family and their  medical providers regarding overall plan of care and treatment options, ensuring decisions are within the context of the patients values and GOCs.  Decision Maker: [name]  SUMMARY OF RECOMMENDATIONS   DNR  Ongoing conversations, plan for family meeting in the oncoming days to discuss patient current status. Ideally patient would be able to participate with a passy muir valve as I do not believe the family will shift the course of care without Joal's input.  Code Status/Advance Care Planning: DNR    Palliative Prophylaxis:   Turn Q2H, Constipation, Pain, ROM  Additional Recommendations (Limitations, Scope, Preferences):  Continue current scope of care   Psycho-social/Spiritual:   Desire for further Chaplaincy support: Yes, patient was a Retail banker  Additional Recommendations: Education on Palliative care   Prognosis: Poor given patients immobility, sacral decubitus ulcer, ESRD HD dependence,   Discharge Planning: Discharge plan is not determined at this time.   PPS: 10%   This conversation/these recommendations were discussed with patient primary care team, Dr. Tawanna Solo  Time In: 1615 Time Out: 1730 Total Time: 75 Greater than 50%  of this time was spent counseling and coordinating care related to the above assessment and plan.  [place] Team Team Cell Phone: [phone] Please utilize secure chat with additional questions, if there is no response within 30 minutes please call the above phone number  Palliative Medicine Team providers are available by phone from 7am to 7pm daily and can be reached through the team cell phone.  Should this patient require assistance outside of these hours, please call the patient's attending physician.

## 2020-03-05 NOTE — Progress Notes (Signed)
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  Patient had recent history of Covid pneumonia and was on ventilator because he progressed to ARDS.  Because of little improvement in the ventilatory and oxygenation, he underwent tracheostomy and was discharged to [name].  He had recent history of UTI, multidrug-resistant Pseudomonas pneumonia, stenotrophomonas bacteremia.  Because of sepsis and shock, he developed renal failure requiring dialysis.  On 5/19, he developed hemoptysis and was transferred to [name] ED for evaluation.  PCCM,ID,Nephrology following here.  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  He also underwent tracheostomy tube change by PCCM for persistent leak.  Due to lack of insurance, he cannot go back to [place].  Social worker assisting on placement.  Anticipate prolonged hospitalization.  Assessment & Plan:   Principal Problem:   Enterococcal bacteremia Active Problems:   Acute respiratory failure (HCC)   Hemoptysis   Pressure injury of skin   Protein-calorie malnutrition, severe   Decubitus ulcer of sacral region, stage 4 (HCC)   Fever   Acute on chronic hypoxic respiratory failure: Status post trach due to respiratory failure from Covid pneumonia.  He is on trach collar this morning.  PCCM following.  History of multiple episodes of pneumonia from different organisms and was treated for different antibiotics.  Most recently pneumonia has been multidrug-resistant Pseudomonas for which she was treated with Avycaz and mycin nebulizers at [facility].  Possible ongoing aspiration also.  Presented with hemoptysis, aspiration of  blood.  Patient has cuffed trach placed by ENT.  No active bleeding on bronchoscopy.  Continue attempts at weaning vent goal is to progress to trach collar.  He also underwent tracheostomy tube change by PCCM for persistent leak on 03/01/20. Chest x-ray done on 03/03/2020 showed bilateral pleural effusions, stable extensive opacity throughout the left hemithorax. Currently he is on trach collar, requiring 7 L of oxygen per minute..  Paroxysmal A. fib: Currently rate is controlled.  Anticoagulation was discontinued due to suspected GI bleed.  Currently on low-dose Eliquis.  On rate control with Cardizem.  Sepsis/bacteremia: Enterococcus and Staphylococcus epidermidis bacteremia.  Also has osteomyelitis fifth sacral segment.  Chest x-ray showed persistent bilateral lower lobe infiltrates.  High suspicion for ongoing aspiration.    Infectious disease consulted and was following.  MRI pelvis/sacrum showed osteomyelitis of fifth sacral segment, edema in the muscle around both hips and proximal thighs with possible myositis.  Currently on vancomycin, ceftriaxone, Flagyl.  IR consulted for bone biopsy but due to high risk  it was not done. ID recommended treatment for 6 weeks for sacral osteomyelitis and staph epidermidis bacteremia with vancomycin, ceftriaxone 2 g IV daily and metronidazole. He has persistent  leukocytosis.  Continue to monitor CBC. He became febrile on  03/02/20, new blood cultures have been sent,NGTD.  ESRD: On dialysis per nephrology.  Underwent  tunnel catheter placement on 6/2.  Undergoing dialysis as per nephrology.  Chronic normocytic anemia: Secondary to ESRD, critical illness.  Has been transfused with PRBCs.  Currently hemoglobin stable.  Continue to monitor  Unstageable sacral pressure ulcer/osteomyelitis: Wound care following.  Discussion as above.  Cachexia/severe protein malnutrition: On tube feeds.  Nutrition following.  Mild elevated LFTs: Continue to monitor.  Disposition:  Patient is from [name].  Anticipate discharge to LTAC/SNF but due to lack of insurance, placement has been difficult.  Social working Sports administrator.  Goals of care: DNR.  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  We have requested for palliative  care evaluation.  Pressure Injury 02/14/20 Sacrum Medial Stage 4 - Full thickness tissue loss with exposed bone, tendon or muscle. (Active)  02/14/20 1906  Location: Sacrum  Location Orientation: Medial  Staging: Stage 4 - Full thickness tissue loss with exposed bone, tendon or muscle.  Wound Description (Comments):   Present on Admission: Yes     Pressure Injury 03/03/20 Neck Anterior Stage 3 -  Full thickness tissue loss. Subcutaneous fat may be visible but bone, tendon or muscle are NOT exposed. Device related injury under trach tube.  (Active)  03/03/20 1805  Location: Neck  Location Orientation: Anterior  Staging: Stage 3 -  Full thickness tissue loss. Subcutaneous fat may be visible but bone, tendon or muscle are NOT exposed.  Wound Description (Comments): Device related injury under trach tube.   Present on Admission: No        Nutrition Problem: Severe Malnutrition Etiology: chronic illness(chronic respiratory failure related to COVID ARDS)      DVT prophylaxis: Eliquis Code Status: DNR Family Communication: Called sister for update on 02/29/20 Status is: Inpatient  Remains inpatient appropriate because:Unsafe d/c plan   Dispo: The patient is from: [facility]              Anticipated d/c is to: SNF/LTAC              Anticipated d/c date is: Unknown   Consultants: ID, PCCM, IR, nephrology  Procedures: ENT flex scope 5/19 > no bleeding source identified FOB 5/19 > no pulmonary bleeding source identified. 6/2: Tunnel catheter placement by IR. 6/4: Changed trach tube  Antimicrobials:  Anti-infectives (From admission, onward)   Start     Dose/Rate  Route Frequency Ordered Stop   03/04/20 2000  cefTAZidime (FORTAZ) 2 g in sodium chloride 0.9 % 100 mL IVPB     2 g 200 mL/hr over 30 Minutes Intravenous Every M-W-F (2000) 03/01/20 1136     03/02/20 1200  vancomycin (VANCOCIN) IVPB 500 mg/100 ml premix     500 mg 100 mL/hr over 60 Minutes Intravenous Every T-Th-Sa (Hemodialysis) 03/01/20 1131 03/02/20 1710   03/01/20 1230  vancomycin (VANCOCIN) IVPB 500 mg/100 ml premix     500 mg 100 mL/hr over 60 Minutes Intravenous Every M-W-F (Hemodialysis) 03/01/20 1131     02/28/20 1110  vancomycin (VANCOREADY) IVPB 500 mg/100 mL     over 60 Minutes  Continuous PRN 02/28/20 1112 02/28/20 1110   02/28/20 1101  vancomycin (VANCOCIN) 1-5 GM/200ML-% IVPB  Status:  Discontinued    Note to Pharmacy: Lytle Butte   : cabinet override      02/28/20 1101 02/28/20 1327   02/27/20 1800  cefTAZidime (FORTAZ) 1 g in sodium chloride 0.9 % 100 mL IVPB     1 g 200 mL/hr over 30 Minutes Intravenous Every 24 hours 02/27/20 0947 03/03/20 1738   02/27/20 1200  vancomycin (VANCOCIN) IVPB 500 mg/100 ml premix     500 mg 100 mL/hr over 60 Minutes Intravenous Once 02/27/20 0829 02/27/20 1343   02/24/20 1800  vancomycin (VANCOREADY) IVPB 500 mg/100 mL     500 mg 100 mL/hr over 60 Minutes Intravenous  Once 02/24/20 1308 02/24/20  1905   02/22/20 2200  metroNIDAZOLE (FLAGYL) tablet 500 mg     500 mg Per Tube Every 8 hours 02/22/20 0957 04/04/20 2359   02/22/20 1700  cefTRIAXone (ROCEPHIN) 2 g in sodium chloride 0.9 % 100 mL IVPB  Status:  Discontinued     2 g 200 mL/hr over 30 Minutes Intravenous Every 24 hours 02/22/20 0957 02/27/20 0947   02/22/20 1200  vancomycin (VANCOCIN) IVPB 500 mg/100 ml premix  Status:  Discontinued     500 mg 100 mL/hr over 60 Minutes Intravenous Every T-Th-Sa (Hemodialysis) 02/20/20 1253 02/21/20 1040   02/21/20 1431  vancomycin variable dose per unstable renal function (pharmacist dosing)  Status:  Discontinued      Does not apply See admin  instructions 02/21/20 1431 03/01/20 1131   02/21/20 1200  vancomycin (VANCOREADY) IVPB 500 mg/100 mL     500 mg 100 mL/hr over 60 Minutes Intravenous  Once 02/21/20 0956 02/21/20 1253   02/21/20 0714  vancomycin (VANCOCIN) 500-5 MG/100ML-% IVPB  Status:  Discontinued    Note to Pharmacy: Ashley Akin   : cabinet override      02/21/20 0714 02/21/20 1346   02/20/20 1400  vancomycin (VANCOREADY) IVPB 1250 mg/250 mL     1,250 mg 166.7 mL/hr over 90 Minutes Intravenous  Once 02/20/20 1253 02/20/20 1538   02/20/20 1200  levofloxacin (LEVAQUIN) IVPB 500 mg  Status:  Discontinued     500 mg 100 mL/hr over 60 Minutes Intravenous Every 48 hours 02/20/20 1020 02/21/20 0943   02/15/20 1545  vancomycin (VANCOCIN) IVPB 1000 mg/200 mL premix  Status:  Discontinued     1,000 mg 200 mL/hr over 60 Minutes Intravenous Every 24 hours 02/14/20 1530 02/14/20 1745   02/15/20 0330  ceFEPIme (MAXIPIME) 2 g in sodium chloride 0.9 % 100 mL IVPB  Status:  Discontinued     2 g 200 mL/hr over 30 Minutes Intravenous Every 12 hours 02/14/20 1531 02/14/20 1531   02/14/20 1531  ceFEPIme (MAXIPIME) 2 g in sodium chloride 0.9 % 100 mL IVPB  Status:  Discontinued     2 g 200 mL/hr over 30 Minutes Intravenous Every 12 hours 02/14/20 1531 02/14/20 1745   02/14/20 1530  ceFEPIme (MAXIPIME) 2 g in sodium chloride 0.9 % 100 mL IVPB  Status:  Discontinued     2 g 200 mL/hr over 30 Minutes Intravenous  Once 02/14/20 1520 02/14/20 1531   02/14/20 1530  vancomycin (VANCOREADY) IVPB 1500 mg/300 mL  Status:  Discontinued     1,500 mg 150 mL/hr over 120 Minutes Intravenous  Once 02/14/20 1530 02/14/20 1745      Subjective:  Patient seen and examined at the bedside this morning.  Currently hemodynamically stable.  Currently on trach collar.  Slightly hyperventilating.  Was not in any distress.  No significant change from yesterday. Objective: Vitals:   03/05/20 0400 03/05/20 0500 03/05/20 0600 03/05/20 0700  BP: 129/78 (!)  113/58 (!) 103/59   Pulse: (!) 59 (!) 50 (!) 108   Resp: (!) 42 (!) 51 (!) 34   Temp:    98.8 F (37.1 C)  TempSrc:    Axillary  SpO2: 100% 99% 100%   Weight:      Height:        Intake/Output Summary (Last 24 hours) at 03/05/2020 0801 Last data filed at 03/04/2020 2200 Gross per 24 hour  Intake 973.94 ml  Output 1850 ml  Net -876.06 ml   [employer]  03/04/20 1250 03/04/20 1637 03/05/20 0348  Weight: 59.5 kg 58 kg 56.1 kg    Examination:   General exam: Not in distress, extremely deconditioned, debilitated HEENT: Trach Respiratory system: Tachypnea, no wheezing or crackles.   Cardiovascular system: Irregularly irregular rhythm no JVD, murmurs, rubs, gallops or clicks. Gastrointestinal system: Abdomen is nondistended, soft and nontender. No organomegaly or masses felt. Normal bowel sounds heard.  PEG, colostomy Central nervous system: Alert and awake Extremities: No edema, no clubbing ,no cyanosis Skin: Sacral decubitus ulcer stage 4    Data Reviewed: I have personally reviewed following labs and imaging studies  CBC: Recent Labs  Lab 03/01/20 0355 03/01/20 0355 03/02/20 0244 03/03/20 0257 03/03/20 1032 03/04/20 0220 03/05/20 0253  WBC 21.5*  --  20.1* 21.9*  --  23.9* 22.8*  NEUTROABS 15.4*  --  14.5* 16.7*  --  17.3* 16.6*  HGB 9.1*   < > 9.4* 8.5* 9.5* 9.0* 8.6*  HCT 29.2*   < > 29.9* 27.1* 28.0* 28.7* 28.0*  MCV 82.7  --  82.4 82.1  --  83.2 83.6  PLT 289  --  305 306  --  346 312   < > = values in this interval not displayed.   Basic Metabolic Panel: Recent Labs  Lab 03/01/20 0355 03/01/20 0355 03/02/20 0244 03/03/20 0257 03/03/20 1032 03/04/20 1255 03/05/20 0253  NA 136   < > 137 135 138 136 135  K 3.4*   < > 3.6 3.9 3.8 3.1* 3.4*  CL 99  --  98 97*  --  97* 98  CO2 24  --  25 26  --  26 28  GLUCOSE 156*  --  130* 144*  --  142* 139*  BUN 59*  --  105* 53*  --  117* 51*  CREATININE 2.47*  --  3.37* 1.93*  --  3.37* 1.83*  CALCIUM 9.2  --   10.0 9.2  --  9.8 9.2  PHOS  --   --   --   --   --  3.2  --    < > = values in this interval not displayed.   GFR: Estimated Creatinine Clearance: 29 mL/min (A) (by C-G formula based on SCr of 1.83 mg/dL (H)). Liver Function Tests: Recent Labs  Lab 02/28/20 0234 03/04/20 1255  AST 39  --   ALT 47*  --   ALKPHOS 233*  --   BILITOT 0.6  --   PROT 8.2*  --   ALBUMIN 2.3* 2.1*   No results for input(s): LIPASE, AMYLASE in the last 168 hours. No results for input(s): AMMONIA in the last 168 hours. Coagulation Profile: No results for input(s): INR, PROTIME in the last 168 hours. Cardiac Enzymes: No results for input(s): CKTOTAL, CKMB, CKMBINDEX, TROPONINI in the last 168 hours. BNP (last 3 results) No results for input(s): PROBNP in the last 8760 hours. HbA1C: No results for input(s): HGBA1C in the last 72 hours. CBG: Recent Labs  Lab 03/04/20 1510 03/04/20 1928 03/04/20 2335 03/05/20 0318 03/05/20 0724  GLUCAP 115* 179* 133* 138* 142*   Lipid Profile: No results for input(s): CHOL, HDL, LDLCALC, TRIG, CHOLHDL, LDLDIRECT in the last 72 hours. Thyroid Function Tests: No results for input(s): TSH, T4TOTAL, FREET4, T3FREE, THYROIDAB in the last 72 hours. Anemia Panel: No results for input(s): VITAMINB12, FOLATE, FERRITIN, TIBC, IRON, RETICCTPCT in the last 72 hours. Sepsis Labs: No results for input(s): PROCALCITON, LATICACIDVEN in the last 168 hours.  Recent Results (  from the past 240 hour(s))  Culture, blood (routine x 2)     Status: None (Preliminary result)   Collection Time: 03/03/20  9:13 AM   Specimen: BLOOD RIGHT HAND  Result Value Ref Range Status   Specimen Description BLOOD RIGHT HAND  Final   Special Requests   Final    BOTTLES DRAWN AEROBIC ONLY Blood Culture adequate volume   Culture   Final    NO GROWTH 2 DAYS Performed at [facility], [address], [place], [place] [address]    Report Status PENDING  Incomplete  Culture, blood (routine x 2)      Status: None (Preliminary result)   Collection Time: 03/03/20  9:13 AM   Specimen: BLOOD LEFT HAND  Result Value Ref Range Status   Specimen Description BLOOD LEFT HAND  Final   Special Requests   Final    BOTTLES DRAWN AEROBIC ONLY Blood Culture adequate volume   Culture   Final    NO GROWTH 2 DAYS Performed at [facility], [place] [address] [place], [place] [address]    Report Status PENDING  Incomplete         Radiology Studies: No results found.      Scheduled Meds: . apixaban  2.5 mg Per Tube BID  . chlorhexidine gluconate (MEDLINE KIT)  15 mL Mouth Rinse BID  . Chlorhexidine Gluconate Cloth  6 each Topical Q0600  . docusate  100 mg Per Tube BID  . feeding supplement (PRO-STAT SUGAR FREE 64)  30 mL Per Tube BID  . insulin aspart  0-6 Units Subcutaneous Q4H  . insulin aspart  2 Units Subcutaneous Q4H  . levothyroxine  25 mcg Per Tube Q0600  . mouth rinse  15 mL Mouth Rinse 10 times per day  . metroNIDAZOLE  500 mg Per Tube Q8H  . nutrition supplement (JUVEN)  1 packet Per Tube BID BM  . pantoprazole sodium  40 mg Per Tube Daily  . polyethylene glycol  17 g Per Tube Daily  . potassium chloride  20 mEq Oral Once  . vancomycin  500 mg Intravenous Q M,W,F-HD   Continuous Infusions: . sodium chloride    . sodium chloride    . sodium chloride Stopped (03/04/20 1524)  . cefTAZidime (FORTAZ)  IV Stopped (03/04/20 2110)  . feeding supplement (NEPRO CARB STEADY) 1,000 mL (03/04/20 2045)     LOS: 20 days    Time spent: 35 mins.More than 50% of that time was spent in counseling and/or coordination of care.      Shelly Coss, MD [facility] P6/04/2020, 8:01 AM

## 2020-03-05 NOTE — Progress Notes (Signed)
Nutrition Follow-up  DOCUMENTATION CODES:   Underweight, Severe malnutrition in context of chronic illness  INTERVENTION:   Continue tube feeding via PEG: - Increase Nepro to 50 ml/h (1200 ml per day) - Pro-stat 30 ml BID  Provides 2360 kcal, 127 grams of protein, 872 ml free water daily.  - Continue Juven BID per tube, each packet provides 80 calories, 8 grams of carbohydrate, 2.5 grams of protein, 7 grams of L-arginine and 7 grams of L-glutamine; supplement contains CaHMB, Vitamins C, E, B12 and zinc to promote wound healing.  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  Ongoing  GOAL:   Patient will meet greater than or equal to 90% of their needs  Met via TF  MONITOR:   Diet advancement, Labs, Weight trends, TF tolerance, Skin, I & O's  REASON FOR ASSESSMENT:   Ventilator, Other (hx PEG)    ASSESSMENT:   74 yo male admitted with hemoptysis. 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  6/02 - s/p new [facility] 6/07 - trach collar  Discussed pt in ICU rounds and with RN today. Pt to continue indefinitely on TC.  Tube feeding infusing via PEG.  Weight down a total of 9 kg since admit. Since pt has been tolerating TC, will increase TF.  Current TF: Nepro @ 45 ml/hr, Pro-stat 30 ml BID  Medications reviewed and include: colace, SSI q 4 hours, Novolog 2 units q 4 hours, Juven, protonix, miralax, Klor-con 20 mEq once  Labs reviewed: potassium 3.4, hemoglobin 8.6 CBG's: 115-179 x 24 hours  HD net UF 6/07: 1500 ml x 24 hours Colostomy: 350 ml x 24 hours  Diet Order:   Diet Order            Diet NPO time specified  Diet effective now              EDUCATION NEEDS:   No education needs have been identified at this time  Skin:  Skin  Assessment: Skin Integrity Issues: DTI: left hip Stage III: neck Stage IV: sacrum  Last BM:  03/04/20 colostomy output 350 ml  Height:   Ht Readings from Last 1 Encounters:  02/14/20 5' 9"  (1.753 m)    Weight:   Wt Readings from Last 1 Encounters:  03/05/20 56.1 kg    Ideal Body Weight:  72.7 kg  BMI:  Body mass index is 18.26 kg/m.  Estimated Nutritional Needs:   Kcal:  2200-2400  Protein:  110-135 grams  Fluid:  1 L + UOP    Gaynell Face, MS, RD, LDN Inpatient Clinical Dietitian Pager: [phone] Weekend/After Hours: [phone]

## 2020-03-06 LAB — BASIC METABOLIC PANEL
Anion gap: 12 (ref 5–15)
BUN: 98 mg/dL — ABNORMAL HIGH (ref 8–23)
CO2: 26 mmol/L (ref 22–32)
Calcium: 9.9 mg/dL (ref 8.9–10.3)
Chloride: 99 mmol/L (ref 98–111)
Creatinine, Ser: 2.88 mg/dL — ABNORMAL HIGH (ref 0.61–1.24)
GFR calc Af Amer: 24 mL/min — ABNORMAL LOW (ref >60)
GFR calc non Af Amer: 21 mL/min — ABNORMAL LOW (ref >60)
Glucose, Bld: 137 mg/dL — ABNORMAL HIGH (ref 70–99)
Potassium: 3.8 mmol/L (ref 3.5–5.1)
Sodium: 137 mmol/L (ref 135–145)

## 2020-03-06 LAB — CBC WITH DIFFERENTIAL/PLATELET
Abs Immature Granulocytes: 0.67 10*3/uL — ABNORMAL HIGH (ref 0.00–0.07)
Basophils Absolute: 0.2 10*3/uL — ABNORMAL HIGH (ref 0.0–0.1)
Basophils Relative: 1 %
Eosinophils Absolute: 0.8 10*3/uL — ABNORMAL HIGH (ref 0.0–0.5)
Eosinophils Relative: 3 %
HCT: 29.8 % — ABNORMAL LOW (ref 39.0–52.0)
Hemoglobin: 9.2 g/dL — ABNORMAL LOW (ref 13.0–17.0)
Immature Granulocytes: 3 %
Lymphocytes Relative: 14 %
Lymphs Abs: 3.2 10*3/uL (ref 0.7–4.0)
MCH: 25.9 pg — ABNORMAL LOW (ref 26.0–34.0)
MCHC: 30.9 g/dL (ref 30.0–36.0)
MCV: 83.9 fL (ref 80.0–100.0)
Monocytes Absolute: 1.9 10*3/uL — ABNORMAL HIGH (ref 0.1–1.0)
Monocytes Relative: 8 %
Neutro Abs: 17 10*3/uL — ABNORMAL HIGH (ref 1.7–7.7)
Neutrophils Relative %: 71 %
Platelets: 347 10*3/uL (ref 150–400)
RBC: 3.55 MIL/uL — ABNORMAL LOW (ref 4.22–5.81)
RDW: 18.7 % — ABNORMAL HIGH (ref 11.5–15.5)
WBC: 23.7 10*3/uL — ABNORMAL HIGH (ref 4.0–10.5)
nRBC: 0 % (ref 0.0–0.2)

## 2020-03-06 LAB — GLUCOSE, CAPILLARY
Glucose-Capillary: 148 mg/dL — ABNORMAL HIGH (ref 70–99)
Glucose-Capillary: 153 mg/dL — ABNORMAL HIGH (ref 70–99)
Glucose-Capillary: 161 mg/dL — ABNORMAL HIGH (ref 70–99)
Glucose-Capillary: 161 mg/dL — ABNORMAL HIGH (ref 70–99)
Glucose-Capillary: 161 mg/dL — ABNORMAL HIGH (ref 70–99)
Glucose-Capillary: 172 mg/dL — ABNORMAL HIGH (ref 70–99)

## 2020-03-06 MED ORDER — POLYETHYLENE GLYCOL 3350 17 G PO PACK: 17.0000 g | PACK | Freq: Every day | ORAL | Status: DC | PRN

## 2020-03-06 MED ORDER — SODIUM CHLORIDE 0.9 % IV SOLN
2.0000 g | INTRAVENOUS | Status: DC
  Administered 2020-03-07 – 2020-03-16 (×5): 2 g via INTRAVENOUS
  Filled 2020-03-06 (×5): qty 2

## 2020-03-06 MED ORDER — VANCOMYCIN HCL IN DEXTROSE 500-5 MG/100ML-% IV SOLN
500.0000 mg | INTRAVENOUS | Status: DC
  Administered 2020-03-07 – 2020-03-09 (×2): 500 mg via INTRAVENOUS
  Filled 2020-03-06 (×6): qty 100

## 2020-03-06 MED ORDER — DOCUSATE SODIUM 50 MG/5ML PO LIQD: 100.0000 mg | Freq: Every day | ORAL | Status: DC | PRN

## 2020-03-06 NOTE — Progress Notes (Signed)
PROGRESS NOTE    Shane Banks  LEX:[id number] DOB: [birth year] DOA: 02/14/2020 PCP: [name], MD    Brief Narrative:  Patient is a 74 year old male with past medical history of chronic hypoxic respiratory failure status post tracheostomy, ESRD on dialysis, atrial fibrillation, nonhemorrhagic stroke who was admitted from [place] for the evaluation of trach site bleeding.  Patient had recent history of Covid pneumonia and was on ventilator because he progressed to ARDS.  Because of little improvement in the ventilatory and oxygenation, he underwent tracheostomy and was discharged to [facility]  He had recent history of UTI, multidrug-resistant Pseudomonas pneumonia, stenotrophomonas bacteremia.  Because of sepsis and shock, he developed renal failure requiring dialysis.  On 5/19, he developed hemoptysis and was transferred to [facility] ED for evaluation.  PCCM,ID,Nephrology following here.  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  He also underwent tracheostomy tube change by PCCM for persistent leak.  Due to lack of insurance, he cannot go back to [place].  Social worker assisting on placement.  Anticipate prolonged hospitalization.    Consultants:   palliative ENT Nephrology ID PCCM  Procedures: ENT flex scope 5/19 > no bleeding source identified FOB 5/19 >no pulmonary bleeding source identified. 6/2: Tunnel catheter placement by IR. 6/4: Changed trach tube  Antimicrobials:   Metronidazole, vancomycin, [name]   Subjective: On trach. No issues noted Objective: Vitals:   03/06/20 0900 03/06/20 1000 03/06/20 1110 03/06/20 1120  BP: 115/82 133/86    Pulse: (!) 111 83  (!) 115  Resp: (!) 41 (!) 38  (!) 34  Temp:   98.3 F (36.8 C)   TempSrc:   Axillary   SpO2: 100% 96%  100%  Weight:      Height:        Intake/Output Summary (Last 24 hours) at 03/06/2020 1155 Last data filed at 03/06/2020 0800 Gross per 24 hour  Intake 1559.17 ml  Output 750  ml  Net 809.17 ml   Filed Weights   03/04/20 1637 03/05/20 0348 03/06/20 0431  Weight: 58 kg 56.1 kg 57.2 kg    Examination:  General exam: Appears calm and comfortable , on trach.  Respiratory system: mildly rhonchorus, no wheezing Cardiovascular system: S1 & S2 heard, RRR. No JVD, murmurs, rubs, gallops or clicks.  Gastrointestinal system: Abdomen is nondistended, soft and nontender. Normal bowel sounds heard. Central nervous system:awake, alert Extremities: trace edema Skin: warm, dry     Data Reviewed: I have personally reviewed following labs and imaging studies  CBC: Recent Labs  Lab 03/02/20 0244 03/02/20 0244 03/03/20 0257 03/03/20 1032 03/04/20 0220 03/05/20 0253 03/06/20 0249  WBC 20.1*  --  21.9*  --  23.9* 22.8* 23.7*  NEUTROABS 14.5*  --  16.7*  --  17.3* 16.6* 17.0*  HGB 9.4*   < > 8.5* 9.5* 9.0* 8.6* 9.2*  HCT 29.9*   < > 27.1* 28.0* 28.7* 28.0* 29.8*  MCV 82.4  --  82.1  --  83.2 83.6 83.9  PLT 305  --  306  --  346 312 347   < > = values in this interval not displayed.   Basic Metabolic Panel: Recent Labs  Lab 03/02/20 0244 03/02/20 0244 03/03/20 0257 03/03/20 1032 03/04/20 1255 03/05/20 0253 03/06/20 0249  NA 137   < > 135 138 136 135 137  K 3.6   < > 3.9 3.8 3.1* 3.4* 3.8  CL 98  --  97*  --  97* 98 99  CO2 25  --  26  --  _0 GLUCOSE 130*  --  144*  --  142* 139* 137*  BUN 105*  --  53*  --  117* 51* 98*  CREATININE 3.37*  --  1.93*  --  3.37* 1.83* 2.88*  CALCIUM 10.0  --  9.2  --  9.8 9.2 9.9  PHOS  --   --   --   --  3.2  --   --    < > = values in this interval not displayed.   GFR: Estimated Creatinine Clearance: 18.8 mL/min (A) (by C-G formula based on SCr of 2.88 mg/dL (H)). Liver Function Tests: Recent Labs  Lab 03/04/20 1255  ALBUMIN 2.1*   No results for input(s): LIPASE, AMYLASE in the last 168 hours. No results for input(s): AMMONIA in the last 168 hours. Coagulation Profile: No results for input(s): INR,  PROTIME in the last 168 hours. Cardiac Enzymes: No results for input(s): CKTOTAL, CKMB, CKMBINDEX, TROPONINI in the last 168 hours. BNP (last 3 results) No results for input(s): PROBNP in the last 8760 hours. HbA1C: No results for input(s): HGBA1C in the last 72 hours. CBG: Recent Labs  Lab 03/05/20 1929 03/05/20 2320 03/06/20 0313 03/06/20 0801 03/06/20 1109  GLUCAP 147* 84 161* 148* 153*   Lipid Profile: No results for input(s): CHOL, HDL, LDLCALC, TRIG, CHOLHDL, LDLDIRECT in the last 72 hours. Thyroid Function Tests: No results for input(s): TSH, T4TOTAL, FREET4, T3FREE, THYROIDAB in the last 72 hours. Anemia Panel: No results for input(s): VITAMINB12, FOLATE, FERRITIN, TIBC, IRON, RETICCTPCT in the last 72 hours. Sepsis Labs: No results for input(s): PROCALCITON, LATICACIDVEN in the last 168 hours.  Recent Results (from the past 240 hour(s))  Culture, blood (routine x 2)     Status: None (Preliminary result)   Collection Time: 03/03/20  9:13 AM   Specimen: BLOOD RIGHT HAND  Result Value Ref Range Status   Specimen Description BLOOD RIGHT HAND  Final   Special Requests   Final    BOTTLES DRAWN AEROBIC ONLY Blood Culture adequate volume   Culture   Final    NO GROWTH 3 DAYS Performed at [facility], [address] [place], [place] [address]    Report Status PENDING  Incomplete  Culture, blood (routine x 2)     Status: None (Preliminary result)   Collection Time: 03/03/20  9:13 AM   Specimen: BLOOD LEFT HAND  Result Value Ref Range Status   Specimen Description BLOOD LEFT HAND  Final   Special Requests   Final    BOTTLES DRAWN AEROBIC ONLY Blood Culture adequate volume   Culture   Final    NO GROWTH 3 DAYS Performed at [facility], [place] [address], [place], [place] [address]    Report Status PENDING  Incomplete         Radiology Studies: No results found.      Scheduled Meds: . apixaban  2.5 mg Per Tube BID  . chlorhexidine gluconate  (MEDLINE KIT)  15 mL Mouth Rinse BID  . Chlorhexidine Gluconate Cloth  6 each Topical Q0600  . feeding supplement (PRO-STAT SUGAR FREE 64)  30 mL Per Tube BID  . insulin aspart  0-6 Units Subcutaneous Q4H  . insulin aspart  2 Units Subcutaneous Q4H  . ipratropium-albuterol  3 mL Nebulization QID  . levothyroxine  25 mcg Per Tube Q0600  . mouth rinse  15 mL Mouth Rinse 10 times per day  .  metroNIDAZOLE  500 mg Per Tube Q8H  . nutrition supplement (JUVEN)  1 packet Per Tube BID BM  . pantoprazole sodium  40 mg Per Tube Daily  . vancomycin  500 mg Intravenous Q M,W,F-HD   Continuous Infusions: . sodium chloride    . sodium chloride    . sodium chloride Stopped (03/04/20 1524)  . cefTAZidime (FORTAZ)  IV Stopped (03/04/20 2110)  . 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  Status post trach due to respiratory failure from Covid pneumonia.  He is on trach collar. PCCM following.  Most recently pneumonia has been multidrug-resistant Pseudomonas for which she was treated with Avycaz and mycin nebulizers at [facility]. Possible ongoing aspiration also.   Presented with hemoptysis, aspiration of blood.  Patient has cuffed trach placed by ENT.  No active bleeding on bronchoscopy.   Continue attempts at weaning vent goal is to progress to trach collar.  He also underwent tracheostomy tube change by PCCM for persistent leak on 03/01/20. Chest x-ray done on 03/03/2020  showed bilateral pleural effusions, stable extensive opacity throughout the left hemithorax. Currently he is on trach collar, requiring 7 L of oxygen per minute.. Per PCCm, trach collar may be destination for pt. duonebs +CPT Continue abx  Paroxysmal A. fib: Currently rate is controlled.  Anticoagulation was discontinued due to suspected GI bleed.  C urrently on low-dose Eliquis.  On rate control with Cardizem.  Sepsis/bacteremia: Enterococcus and Staphylococcus epidermidis bacteremia.  Also has osteomyelitis fifth sacral segment.    High suspicion for ongoing aspiration.    Infectious disease consulted and was following.   MRI pelvis/sacrum showed osteomyelitis of fifth sacral segment, edema in the muscle around both hips and proximal thighs with possible myositis.   Currently on vancomycin,fortaz, Flagyl.   IR consulted for bone biopsy but due to high risk  it was not done. ID recommended treatment for 6 weeks for sacral osteomyelitis and staph epidermidis bacteremia with vancomycin, fortaz and metronidazole. He has persistent  leukocytosis.  Continue to monitor CBC.   ESRD: On dialysis per nephrology.  Underwent  tunnel catheter placement on 6/2.   HD in am   Chronic normocytic anemia: Secondary to ESRD, critical illness.  Has been transfused with PRBCs.  Currently hemoglobin stable.  Continue to monitor  Unstageable sacral pressure ulcer/osteomyelitis: Wound care following.  Discussion as above.  Cachexia/severe protein malnutrition: On tube feeds.  Nutrition following.  Mild elevated LFTs: Continue to monitor.  Disposition: Patient is from [place].  Anticipate discharge to LTAC/SNF but due to lack of insurance, placement has been difficult.  Social working Sports administrator.  Goals of care: DNR.  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  We have requested for palliative  care evaluation.  Pressure  Injury 02/14/20 Sacrum Medial Stage 4 - Full thickness tissue loss with exposed bone, tendon or muscle. (Active)  02/14/20 1906  Location: Sacrum  Location Orientation: Medial  Staging: Stage 4 - Full thickness tissue loss with exposed bone, tendon or muscle.  Wound Description (Comments):   Present on Admission: Yes     Pressure Injury 03/03/20 Neck Anterior Stage 3 -  Full thickness tissue loss. Subcutaneous fat may be visible but bone, tendon or muscle are NOT exposed. Device related injury under trach tube.  (Active)  03/03/20 1805  Location: Neck  Location Orientation: Anterior  Staging: Stage 3 -  Full thickness tissue loss. Subcutaneous fat may be visible but bone, tendon or muscle are NOT exposed.  Wound Description (Comments): Device related injury under trach tube.   Present on Admission: No   Nutrition Problem: Severe Malnutrition Etiology: chronic illness(chronic respiratory failure related to COVID ARDS)  DVT prophylaxis: Eliquis Code Status: DNR Family Communication: None at bedside Disposition Plan:  Status is: Inpatient  Remains inpatient appropriate because:Unsafe d/c plan   Dispo: The patient is from: [facility]  Anticipated d/c is to: SNF/LTAC  Anticipated d/c date is: Unknown     LOS: 21 days   Time spent:45 min with >50% on coc    Nolberto Hanlon, MD [facility] Pager [phone]  If 7PM-7AM, please contact night-coverage [email or web address] Password [email or web address] 03/06/2020, 11:55 AM

## 2020-03-06 NOTE — Progress Notes (Signed)
NAME:  Shane Banks, MRN:  [id number], DOB:  September 23, 1946, LOS: 21 ADMISSION DATE:  02/14/2020, CONSULTATION DATE:  5/19 REFERRING MD:  Dr. Alvino Chapel, CHIEF COMPLAINT:  Hemoptysis   Brief History   74 yo m with PMHx of afib with RVR, BPH, stroke, and COVID ARDS in January requiring trach and LTACH placement at [facility]. Had been tolerating trach collar, but on 5/19 developed hemoptysis and hypoxemic respiratory failure requiring vent. Transferred to [facility] on 5/19.  Past Medical History   has a past medical history of Acute on chronic respiratory failure with hypoxia ([place]), Atrial fibrillation with RVR ([place]), BPH (benign prostatic hyperplasia), COVID-19 virus infection, Pneumonia due to COVID-19 virus, Severe sepsis ([place]), and Stroke ([place]).  Significant Hospital Events   1/8  Admit to [facility] for COVID PNA 2/23 Transfer to [facility] on vent 5/19 Transfer to [facility] for hemoptysis. #6 cuffed trach placed. Back on vent. Bronch performed 5/20 Transitioned to trach collar.  5/22 back on vent full time 5/24 back on antibiotics for fevers 6/1 Initiating PSV weans 6/3 Large cuff leak, trach exchange 6/7 transitioned to [facility] 6/9 remains on TC continuously since 6/7  Consults:  ENT Nephrology ID PCCM  Procedures:  ENT flex scope 5/19 > no bleeding source identified FOB 5/19 > no pulmonary bleeding source identified.   Significant Diagnostic Tests:  CXR 5/28 >> R CVC placement confirmed Slightly improved aeration at the right base. 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 Edema in the muscles around the hips and in the posterior paraspinal musculature of the lower lumbar spine and in the buttocks which could represent myositis. Fluid-fluid level in the bladder may represent protein or debris in  the bladder. The possibility of urinary tract infection should be considered.  CXR 6/3> improved R lung aeration, worsening L sided opacity. Tracheostomy tube, HD catheter.   CXR 6/4>wosened R lung infiltrates and stable L sided infiltrate. Possible bilateral pleural effusions  Micro Data:    tracheal aspirate 5/7> for Pseudomonas A  BAL 5/19 > no growth 5/19 blood > NG 5/22 trach aspirate>> candida [place]ip[place] 5/22 blood>> 1/4 GPC> staph epi 5/25 blood>> GPC (enterococcus on biofire) 2/4 bottles>> E. Faecalis, staph epi. 5/25 trach aspirate>>many PMN, few GPR, rare GPC> diphtheroids 5/25 blood cx>> + for Enterococcus Faecalis (gent resistant)/ Staphylococcus Epidermidis (tetracycline, vanc, rifampin sensitive) 5/27 BCx> ngtd    Antimicrobials:  Levofloxacin 5/24>5/26 vanc 5/25> Ceftriaxone  5/27>6/1 Metronidazole 5/27>  [name] 6/1>  Interim history/subjective:  Doing well on TC. Tachypneic, but has been even when he was on the vent. Feels like he is breathing well. Some thick secretions remain.   Objective   Blood pressure 127/68, pulse (!) 113, temperature 98.5 F (36.9 C), temperature source Oral, resp. rate (!) 40, height _0  (1.753 m), weight 57.2 kg, SpO2 100 %.    FiO2 (%):  [28 %] 28 %   Intake/Output Summary (Last 24 hours) at 03/06/2020 0852 Last data filed at 03/06/2020 0600 Gross per 24 hour  Intake 1459.17 ml  Output 750 ml  Net 709.17 ml   Filed Weights   03/04/20 1637 03/05/20 0348 03/06/20 0431  Weight: 58 kg 56.1 kg 57.2 kg    Examination:   General:  Chronically ill appearing frail elderly male. Neuro:  Awake, alert, shakes/nods head to respond.  HEENT:  [place]/AT,  No JVD noted, PERRL Cardiovascular:  RRR, no MRG Lungs:  Transmitted upper airway sounds r/t secretions, clear with suctioning.  Abdomen:  Soft, non-distended, non-tender.  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  Apparently was able to tolerate ATC in past although I am having a hard time believing this.  His lung compliance is terrible as is his inspiratory strength. - Continue trach collar as tolerated. Trach collar is likely destination for him.  - Duonebs + CPT - Abx per primary team - Volume removal as tolerated.  - Will continue to follow and observe for palliative evaluation.   Other issues Enterococcus/ MRSE bacteremia HD cath sepsis  Sacral osteo/ decub stage IV  Afib S/p PEG, diverting ostomy  Best practice:  Per primary team    Georgann Housekeeper, AGACNP-BC [place] for personal pager PCCM on call pager [phone]  03/06/2020 8:56 AM

## 2020-03-06 NOTE — Progress Notes (Signed)
[facility] Progress Note  Subjective: has remained on TC.  I/Os yest 1.5 / 0.75 stool Palliative care d/w family yesterday.  Vitals:   03/06/20 0700 03/06/20 0720 03/06/20 0800 03/06/20 0803  BP: 127/68  119/71   Pulse: (!) 48 (!) 113 (!) 111   Resp: (!) 45 (!) 40 (!) 37   Temp:    98.5 F (36.9 C)  TempSrc:    Oral  SpO2: 95% 100% 100%   Weight:      Height:        Exam:   In ICU on TC, awake and will mouth words  no jvd  Chest cta bilat ant/ lat  Cor reg no RG  Abd soft ntnd no ascites, +llq ostomy, + peg tube   Ext  Minor edema   Neuro: as above     Summary: came from [facility] to [facility] after COVID hospitalization which included mech ventilation and trach placement. At [facility] had severe infections/ bacteremia/ PNA and developed AKI felt due to IV gent toxicity requiring initiation of HD 12/16/19. Admitted to ICU [facility] for trach bleed on 5/19. Was getting MWF HD at [facility] prior to admit here.   OP HD: started March 2021 at [facility] as above MWF   Getting HD here 3.5- 4h, UF 0.5- 1.5 L, hep 2000 prn   admit 62.8kg > range 56.9- 64.7kg here  Assessment/ Plan: 1. 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 On IV abx. Get volume down as tol w/ HD. Doing fairly well on TC now.  2. Enterococcus/ MRSE bacteremia HD cath sepsis - TDC removed 5/26, per ID getting 6 wks vanc/ fortaz/ flagyl for polymicrobial sepsis and osteomyelitis.  3. Sacral osteo/ decub stage IV - per ID, also abx as above 4. ESRD/ AKI - on HD since mid March 2021, d/t gent toxicity. HD MWF at [facility]. S/P new TDC 6/2 by IR.  HD tomorrow.   5. Trach bleed - resolved 6. Tracheobronchitis - sp course of doxycycline 7. AMS - variable, seems overall improved 8. Atrial fib - per primary, on eliquis 2.5 BID 9. Anemia ckd - transfuse prn 10. Hypercalcemia:  w/ low PTH, prob from immobility, corrCa 11.0 > 10.5>10.3, improved. Cont low 2.0 Ca++ bath , PTH  29 11. BP/vol - no sig vol excess, down almost 9kg from admit, no vol^ on exam 12. SP peg/ divert colostomy 13. SP COVID pna - original problem in Feb at OSH 14. DNR, poor prognosis, LTACH only option for disposition given needs but apparently not a candidate.  Appreciate palliative care getting involved, their note was reviewed.  Hopefully patient can participate in the discussion to take the burden off family to make more palliative decisions.    Jannifer Hick MD [facility] Pager [phone]   Recent Labs  Lab 03/04/20 1255 03/04/20 1255 03/05/20 0253 03/06/20 0249  K 3.1*   < > 3.4* 3.8  BUN 117*   < > 51* 98*  CREATININE 3.37*   < > 1.83* 2.88*  CALCIUM 9.8   < > 9.2 9.9  PHOS 3.2  --   --   --   HGB  --   --  8.6* 9.2*   < > = values in this interval not displayed.   Inpatient medications: . apixaban  2.5 mg Per Tube BID  . chlorhexidine gluconate (MEDLINE KIT)  15 mL Mouth Rinse BID  . Chlorhexidine Gluconate Cloth  6 each  Topical Q0600  . docusate  100 mg Per Tube BID  . feeding supplement (PRO-STAT SUGAR FREE 64)  30 mL Per Tube BID  . insulin aspart  0-6 Units Subcutaneous Q4H  . insulin aspart  2 Units Subcutaneous Q4H  . ipratropium-albuterol  3 mL Nebulization QID  . levothyroxine  25 mcg Per Tube Q0600  . mouth rinse  15 mL Mouth Rinse 10 times per day  . metroNIDAZOLE  500 mg Per Tube Q8H  . nutrition supplement (JUVEN)  1 packet Per Tube BID BM  . pantoprazole sodium  40 mg Per Tube Daily  . polyethylene glycol  17 g Per Tube Daily  . vancomycin  500 mg Intravenous Q M,W,F-HD   . sodium chloride    . sodium chloride    . sodium chloride Stopped (03/04/20 1524)  . cefTAZidime (FORTAZ)  IV Stopped (03/04/20 2110)  . feeding supplement (NEPRO CARB STEADY) 1,000 mL (03/06/20 0151)   sodium chloride, sodium chloride, sodium chloride, acetaminophen (TYLENOL) oral liquid 160 mg/5 mL, alteplase, fentaNYL (SUBLIMAZE) injection, heparin, heparin,  heparin, heparin, lidocaine (PF), lidocaine-prilocaine, midazolam, midazolam, pentafluoroprop-tetrafluoroeth

## 2020-03-06 NOTE — Evaluation (Signed)
Passy-Muir Speaking Valve - Evaluation Patient Details  Name: Shane Banks MRN: [id number] Date of Birth: 1946/05/20  Today's Date: 03/06/2020 Time: 1000-1012 SLP Time Calculation (min) (ACUTE ONLY): 12 min  Past Medical History:  Past Medical History:  Diagnosis Date  . Acute on chronic respiratory failure with hypoxia ([place])   . Atrial fibrillation with RVR ([place])   . BPH (benign prostatic hyperplasia)   . COVID-19 virus infection   . Pneumonia due to COVID-19 virus   . Severe sepsis ([place])   . Stroke [facility])    Past Surgical History:  Past Surgical History:  Procedure Laterality Date  . IR FLUORO GUIDE CV LINE RIGHT  12/15/2019  . IR FLUORO GUIDE CV LINE RIGHT  02/28/2020  . IR THORACENTESIS ASP PLEURAL SPACE W/IMG GUIDE  12/21/2019  . IR US GUIDE VASC ACCESS RIGHT  12/15/2019  . IR US GUIDE VASC ACCESS RIGHT  02/28/2020   HPI:  74 yo m with PMHx of afib with RVR, BPH, stroke, and COVID ARDS in January requiring trach and LTACH placement at [facility]. Had been tolerating trach collar, but on 5/19 developed hemoptysis and hypoxemic respiratory failure requiring vent. Transferred to [facility] on 5/19. PMSV desired for discussion with pt regarding goals of care.   Assessment / Plan / Recommendation Clinical Impression  Pt has a chronic trach since January of this year with limited history available regarding use of PMSV or communication methods. Pt was observed to be alert, in mild distress coughing on secretions without ability to clear, cuffed [place] XLT with cuff inflated. Pt able to follow oral motor commands prior to trach interventions. RN present with deep tracheal suctioning during cuff deflation. RR increased dramatically to 60, but stabiliated to 40 and 30 after clearing secretions. Placement of PMSV additionally assisted in oral expectoration. Pt eventually able to verbalize name, place and some simple biographical information in response to questions. Voice is dysphonic,  breath support for increased volume impaired. Pt not yet successful with verbal cueing. Trunk support is a barrier to success. Otherwise no signs of air trapping, redirection of air to upper airway adequate. Recommend pt wear PMSV with full staff supervision today (also during therapies) as this may assist in secretion management. Keep cuff deflated when off vent. Will f/u to assist in functional communication and PMSV tolerance, Pt requesting water, unsure if swallow eval is appropriate. Will f/u with family.  SLP Visit Diagnosis: Aphonia (R49.1)    SLP Assessment  Patient needs continued Speech Lanaguage Pathology Services    Follow Up Recommendations  LTACH    Frequency and Duration min 2x/week  2 weeks    PMSV Trial PMSV was placed for: 8 minutes Able to redirect subglottic air through upper airway: Yes Able to Attain Phonation: Yes Voice Quality: Low vocal intensity;Breathy Able to Expectorate Secretions: Yes Level of Secretion Expectoration with PMSV: Oral Breath Support for Phonation: Moderately decreased Intelligibility: Intelligibility reduced Word: 50-74% accurate Phrase: 0-24% accurate Sentence: 0-24% accurate Conversation: 0-24% accurate Respirations During Trial: (!) 40(usually below 30) SpO2 During Trial: 100 % Behavior: Alert;Cooperative   Tracheostomy Tube  Additional Tracheostomy Tube Assessment Trach Collar Period: at least 24 hours Secretion Description: runny, clear, initially copious Frequency of Tracheal Suctioning: often, more than once an hour Level of Secretion Expectoration: Tracheal    Vent Dependency  Vent Dependent: (not usually, maybe once since admission) FiO2 (%): 28 %    Cuff Deflation Trial  GO Tolerated Cuff Deflation: Yes Length of Time for Cuff  Deflation Trial: 10 minutes Behavior: Alert;Cooperative Cuff Deflation Trial - Comments: lots of initially coughing and tachypnea, calmed after clearing          Shane [place], MA  CCC-SLP  Acute Rehabilitation Services Pager [phone] Office [phone]  [name] 03/06/2020, 11:09 AM

## 2020-03-06 NOTE — Evaluation (Signed)
Physical Therapy Evaluation Patient Details Name: Shane Banks MRN: [id number] DOB: [birth year] Today's Date: 03/06/2020   History of Present Illness  74 yo m with PMHx of afib with RVR, BPH, stroke, and COVID ARDS in January requiring trach and LTACH placement at [facility]. Had been tolerating trach collar, but on 5/19 developed hemoptysis and hypoxemic respiratory failure requiring vent. Transferred to [facility] on 5/19.  5/19 back on vent, 6/7 transitioned to TC  Clinical Impression  Pt admitted with/for complications related to covid/ARDS.  Pt needing total assist at this time due to significant debilitation.  Pt currently limited functionally due to the problems listed. ( See problems list.)   Pt will benefit from PT to maximize function and safety in order to get ready for next venue listed below.     Follow Up Recommendations SNF;LTACH;Supervision/Assistance - 24 hour    Equipment Recommendations  Other (comment)(TBA)    Recommendations for Other Services OT consult     Precautions / Restrictions Precautions Precautions: Fall Precaution Comments: TC continuously on 28% FiO2 Restrictions Weight Bearing Restrictions: No      Mobility  Bed Mobility Overal bed mobility: Needs Assistance Bed Mobility: Supine to Sit;Sit to Supine     Supine to sit: Total assist Sit to supine: Total assist;+2 for physical assistance      Transfers                 General transfer comment: NT  Ambulation/Gait                Stairs            Wheelchair Mobility    Modified Rankin (Stroke Patients Only)       Balance Overall balance assessment: Needs assistance Sitting-balance support: No upper extremity supported;Single extremity supported;Feet supported Sitting balance-Leahy Scale: Zero Sitting balance - Comments: noticed minimal abdominal activation, trace paraspinals.  At EOB, BP 123/90 sat ~8 min                                      Pertinent Vitals/Pain Pain Assessment: Faces Faces Pain Scale: Hurts little more Pain Location: with ROM at ankles/knees elbows and shoulders Pain Descriptors / Indicators: Tightness;Sore;Grimacing;Guarding;Discomfort Pain Intervention(s): Monitored during session;Limited activity within patient's tolerance    Home Living Family/patient expects to be discharged to:: Other (Comment)(SNF or LTAC likely)                      Prior Function Level of Independence: Needs assistance   Gait / Transfers Assistance Needed: Since stroke has been limited, but functional  until he contracted Covid.           Hand Dominance        Extremity/Trunk Assessment   Upper Extremity Assessment Upper Extremity Assessment: RUE deficits/detail;LUE deficits/detail(OT needed for tightness and contracture) RUE Deficits / Details: tight scapula, but with some movement, shoulder neutral to 45* flex, elbow flexion to 70*, 0* ext, limited wrist , limited finger ext. RUE Coordination: decreased gross motor;decreased fine motor LUE Deficits / Details: more functional range than R UE.  elbow to ~110*, tight wrist in ext, fingers tight in extension. LUE Coordination: decreased fine motor;decreased gross motor    Lower Extremity Assessment Lower Extremity Assessment: RLE deficits/detail;LLE deficits/detail RLE Deficits / Details: trace voluntary movement.  knee flexion contracture to ~80, neutral df RLE Coordination: decreased fine motor;decreased gross motor  LLE Deficits / Details: similar to R LE, but knee flexion to 90* LLE Coordination: decreased fine motor;decreased gross motor    Cervical / Trunk Assessment Cervical / Trunk Assessment: Kyphotic  Communication   Communication: Expressive difficulties;Passy-Muir valve(PMV initiated 6/9)  Cognition Arousal/Alertness: Awake/alert Behavior During Therapy: Flat affect;WFL for tasks assessed/performed Overall Cognitive Status: Difficult to  assess                                        General Comments General comments (skin integrity, edema, etc.): HR  110's to upper 120's,  RR in the 30's and 40's, and low to mid 90's on 28% FiO2 with PMV    Exercises Other Exercises Other Exercises: PROM to all 4's with emphasi on bil knees elbows and shd's.   Assessment/Plan    PT Assessment Patient needs continued PT services  PT Problem List Decreased strength;Decreased activity tolerance;Decreased range of motion;Decreased balance;Decreased mobility;Decreased coordination;Decreased knowledge of use of DME;Cardiopulmonary status limiting activity;Pain;Decreased skin integrity       PT Treatment Interventions Functional mobility training;Therapeutic activities;Therapeutic exercise;Balance training;Patient/family education;Manual techniques    PT Goals (Current goals can be found in the Care Plan section)  Acute Rehab PT Goals Patient Stated Goal: pt unable to participate PT Goal Formulation: With patient Time For Goal Achievement: 03/20/20 Potential to Achieve Goals: Fair    Frequency Min 2X/week   Barriers to discharge        Co-evaluation               AM-PAC PT "6 Clicks" Mobility  Outcome Measure Help needed turning from your back to your side while in a flat bed without using bedrails?: Total Help needed moving from lying on your back to sitting on the side of a flat bed without using bedrails?: Total Help needed moving to and from a bed to a chair (including a wheelchair)?: Total Help needed standing up from a chair using your arms (e.g., wheelchair or bedside chair)?: Total Help needed to walk in hospital room?: Total Help needed climbing 3-5 steps with a railing? : Total 6 Click Score: 6    End of Session Equipment Utilized During Treatment: Oxygen Activity Tolerance: Patient tolerated treatment well Patient left: in bed;with call bell/phone within reach;with nursing/sitter in  room Nurse Communication: Mobility status PT Visit Diagnosis: Muscle weakness (generalized) (M62.81);Adult, failure to thrive (R62.7);Other abnormalities of gait and mobility (R26.89)    Time: 1040-1106 PT Time Calculation (min) (ACUTE ONLY): 26 min   Charges:   PT Evaluation $PT Eval High Complexity: 1 High PT Treatments $Therapeutic Activity: 8-22 mins        03/06/2020  [name], PT Acute Rehabilitation Services [phone]  (pager) [phone]  (office)  Tessie Fass Read Bonelli 03/06/2020, 11:31 AM

## 2020-03-07 LAB — BASIC METABOLIC PANEL
Anion gap: 12 (ref 5–15)
BUN: 147 mg/dL — ABNORMAL HIGH (ref 8–23)
CO2: 26 mmol/L (ref 22–32)
Calcium: 10.4 mg/dL — ABNORMAL HIGH (ref 8.9–10.3)
Chloride: 102 mmol/L (ref 98–111)
Creatinine, Ser: 3.9 mg/dL — ABNORMAL HIGH (ref 0.61–1.24)
GFR calc Af Amer: 17 mL/min — ABNORMAL LOW (ref >60)
GFR calc non Af Amer: 14 mL/min — ABNORMAL LOW (ref >60)
Glucose, Bld: 132 mg/dL — ABNORMAL HIGH (ref 70–99)
Potassium: 3.8 mmol/L (ref 3.5–5.1)
Sodium: 140 mmol/L (ref 135–145)

## 2020-03-07 LAB — CBC WITH DIFFERENTIAL/PLATELET
Abs Immature Granulocytes: 0.41 10*3/uL — ABNORMAL HIGH (ref 0.00–0.07)
Basophils Absolute: 0.1 10*3/uL (ref 0.0–0.1)
Basophils Relative: 1 %
Eosinophils Absolute: 0.6 10*3/uL — ABNORMAL HIGH (ref 0.0–0.5)
Eosinophils Relative: 2 %
HCT: 29.1 % — ABNORMAL LOW (ref 39.0–52.0)
Hemoglobin: 9.2 g/dL — ABNORMAL LOW (ref 13.0–17.0)
Immature Granulocytes: 2 %
Lymphocytes Relative: 12 %
Lymphs Abs: 3 10*3/uL (ref 0.7–4.0)
MCH: 26.1 pg (ref 26.0–34.0)
MCHC: 31.6 g/dL (ref 30.0–36.0)
MCV: 82.7 fL (ref 80.0–100.0)
Monocytes Absolute: 1.8 10*3/uL — ABNORMAL HIGH (ref 0.1–1.0)
Monocytes Relative: 7 %
Neutro Abs: 19.1 10*3/uL — ABNORMAL HIGH (ref 1.7–7.7)
Neutrophils Relative %: 76 %
Platelets: 360 10*3/uL (ref 150–400)
RBC: 3.52 MIL/uL — ABNORMAL LOW (ref 4.22–5.81)
RDW: 19.4 % — ABNORMAL HIGH (ref 11.5–15.5)
WBC: 25.6 10*3/uL — ABNORMAL HIGH (ref 4.0–10.5)
nRBC: 0 % (ref 0.0–0.2)

## 2020-03-07 LAB — GLUCOSE, CAPILLARY
Glucose-Capillary: 137 mg/dL — ABNORMAL HIGH (ref 70–99)
Glucose-Capillary: 151 mg/dL — ABNORMAL HIGH (ref 70–99)
Glucose-Capillary: 148 mg/dL — ABNORMAL HIGH (ref 70–99)
Glucose-Capillary: 154 mg/dL — ABNORMAL HIGH (ref 70–99)
Glucose-Capillary: 145 mg/dL — ABNORMAL HIGH (ref 70–99)
Glucose-Capillary: 134 mg/dL — ABNORMAL HIGH (ref 70–99)

## 2020-03-07 LAB — VANCOMYCIN, RANDOM: Vancomycin Rm: 20

## 2020-03-07 MED ORDER — HEPARIN SODIUM (PORCINE) 1000 UNIT/ML IJ SOLN
INTRAMUSCULAR | Status: AC
  Filled 2020-03-07: qty 8

## 2020-03-07 NOTE — Progress Notes (Signed)
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  Patient had recent history of Covid pneumonia and was on ventilator because he progressed to ARDS.  Because of little improvement in the ventilatory and oxygenation, he underwent tracheostomy and was discharged to [facility].  He had recent history of UTI, multidrug-resistant Pseudomonas pneumonia, stenotrophomonas bacteremia.  Because of sepsis and shock, he developed renal failure requiring dialysis.  On 5/19, he developed hemoptysis and was transferred to [facility] ED for evaluation.  PCCM,ID,Nephrology following here.  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  He also underwent tracheostomy tube change by PCCM for persistent leak.  Due to lack of insurance, he cannot go back to [facility].  Social worker assisting on placement.  Anticipate prolonged hospitalization.    Consultants:   palliative ENT Nephrology ID PCCM  Procedures: ENT flex scope 5/19 > no bleeding source identified FOB 5/19 >no pulmonary bleeding source identified. 6/2: Tunnel catheter placement by IR. 6/4: Changed trach tube  Antimicrobials:   Metronidazole, vancomycin, Fortaz   Subjective: Lying in bed, getting HD.  On asking question by him unable to hear him as he is mumbling softly   Objective: Vitals:   03/07/20 0600 03/07/20 0700 03/07/20 0751 03/07/20 0803  BP: 136/78 (!) 143/70    Pulse: (!) 104 (!) 51 (!) 114   Resp: (!) 32 (!) 28 (!) 42   Temp:    98.4 F (36.9 C)  TempSrc:    Axillary  SpO2: 100% 98% 100%   Weight:      Height:        Intake/Output Summary (Last 24 hours) at 03/07/2020  0810 Last data filed at 03/07/2020 0600 Gross per 24 hour  Intake 1190 ml  Output --  Net 1190 ml   Filed Weights   03/05/20 0348 03/06/20 0431 03/07/20 0438  Weight: 56.1 kg 57.2 kg 59.3 kg    Examination:  General exam: Appears calm and comfortable , on trach. , receiving HD Respiratory system: less rhonchorus, no rales or wheezing Cardiovascular system: S1 & S2 heard, RRR. No JVD, murmurs, rubs, gallops or clicks.  Gastrointestinal system: Abdomen is nondistended, soft and nontender. Normal bowel sounds heard. Central nervous system:awake, alert Extremities: trace edema Skin: warm, dry Mood appropriate for current setting    Data Reviewed: I have personally reviewed following labs and imaging studies  CBC: Recent Labs  Lab 03/03/20 0257 03/03/20 0257 03/03/20 1032 03/04/20 0220 03/05/20 0253 03/06/20 0249 03/07/20 0307  WBC 21.9*  --   --  23.9* 22.8* 23.7* 25.6*  NEUTROABS 16.7*  --   --  17.3* 16.6* 17.0* 19.1*  HGB 8.5*   < > 9.5* 9.0* 8.6* 9.2* 9.2*  HCT 27.1*   < > 28.0* 28.7* 28.0* 29.8* 29.1*  MCV 82.1  --   --  83.2 83.6 83.9 82.7  PLT 306  --   --  346 312 347 360   < > = values in this interval not displayed.   Basic Metabolic Panel: Recent Labs  Lab 03/03/20 0257 03/03/20 0257 03/03/20 1032 03/04/20 1255 03/05/20 0253 03/06/20 0249 03/07/20 0307  NA 135   < > 138 136 135 137 140  K 3.9   < >  3.8 3.1* 3.4* 3.8 3.8  CL 97*  --   --  97* 98 99 102  CO2 26  --   --  26 28 26 26   GLUCOSE 144*  --   --  142* 139* 137* 132*  BUN 53*  --   --  117* 51* 98* 147*  CREATININE 1.93*  --   --  3.37* 1.83* 2.88* 3.90*  CALCIUM 9.2  --   --  9.8 9.2 9.9 10.4*  PHOS  --   --   --  3.2  --   --   --    < > = values in this interval not displayed.   GFR: Estimated Creatinine Clearance: 14.4 mL/min (A) (by C-G formula based on SCr of 3.9 mg/dL (H)). Liver Function Tests: Recent Labs  Lab 03/04/20 1255  ALBUMIN 2.1*   No results for input(s): LIPASE,  AMYLASE in the last 168 hours. No results for input(s): AMMONIA in the last 168 hours. Coagulation Profile: No results for input(s): INR, PROTIME in the last 168 hours. Cardiac Enzymes: No results for input(s): CKTOTAL, CKMB, CKMBINDEX, TROPONINI in the last 168 hours. BNP (last 3 results) No results for input(s): PROBNP in the last 8760 hours. HbA1C: No results for input(s): HGBA1C in the last 72 hours. CBG: Recent Labs  Lab 03/06/20 1519 03/06/20 1913 03/06/20 2331 03/07/20 0326 03/07/20 0753  GLUCAP 161* 161* 172* 137* 151*   Lipid Profile: No results for input(s): CHOL, HDL, LDLCALC, TRIG, CHOLHDL, LDLDIRECT in the last 72 hours. Thyroid Function Tests: No results for input(s): TSH, T4TOTAL, FREET4, T3FREE, THYROIDAB in the last 72 hours. Anemia Panel: No results for input(s): VITAMINB12, FOLATE, FERRITIN, TIBC, IRON, RETICCTPCT in the last 72 hours. Sepsis Labs: No results for input(s): PROCALCITON, LATICACIDVEN in the last 168 hours.  Recent Results (from the past 240 hour(s))  Culture, blood (routine x 2)     Status: None (Preliminary result)   Collection Time: 03/03/20  9:13 AM   Specimen: BLOOD RIGHT HAND  Result Value Ref Range Status   Specimen Description BLOOD RIGHT HAND  Final   Special Requests   Final    BOTTLES DRAWN AEROBIC ONLY Blood Culture adequate volume   Culture   Final    NO GROWTH 4 DAYS Performed at [facility], [address], [place], [place] [address]    Report Status PENDING  Incomplete  Culture, blood (routine x 2)     Status: None (Preliminary result)   Collection Time: 03/03/20  9:13 AM   Specimen: BLOOD LEFT HAND  Result Value Ref Range Status   Specimen Description BLOOD LEFT HAND  Final   Special Requests   Final    BOTTLES DRAWN AEROBIC ONLY Blood Culture adequate volume   Culture   Final    NO GROWTH 4 DAYS Performed at [facility], [place] [address], [place], [place] [address]    Report Status PENDING  Incomplete           Radiology Studies: No results found.      Scheduled Meds:  apixaban  2.5 mg Per Tube BID   chlorhexidine gluconate (MEDLINE KIT)  15 mL Mouth Rinse BID   Chlorhexidine Gluconate Cloth  6 each Topical Q0600   feeding supplement (PRO-STAT SUGAR FREE 64)  30 mL Per Tube BID   insulin aspart  0-6 Units Subcutaneous Q4H   insulin aspart  2 Units Subcutaneous Q4H   ipratropium-albuterol  3 mL Nebulization  QID   levothyroxine  25 mcg Per Tube Q0600   mouth rinse  15 mL Mouth Rinse 10 times per day   metroNIDAZOLE  500 mg Per Tube Q8H   nutrition supplement (JUVEN)  1 packet Per Tube BID BM   pantoprazole sodium  40 mg Per Tube Daily   vancomycin  500 mg Intravenous Q T,Th,Sa-HD   Continuous Infusions:  sodium chloride     sodium chloride     sodium chloride Stopped (03/04/20 1524)   cefTAZidime (FORTAZ)  IV     feeding supplement (NEPRO CARB STEADY) 1,000 mL (03/07/20 0404)    Assessment & Plan:   Principal Problem:   Enterococcal bacteremia Active Problems:   Acute respiratory failure (HCC)   Hemoptysis   Pressure injury of skin   Protein-calorie malnutrition, severe   Decubitus ulcer of sacral region, stage 4 (HCC)   Fever   Palliative care by specialist   Goals of care, counseling/discussion   DNR (do not resuscitate)   Acute on chronic hypoxemic respiratory failure requiring tracheostomy, 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  Status post trach due to respiratory failure from Covid pneumonia.  He is on trach collar. PCCM following.  Most recently pneumonia has been multidrug-resistant Pseudomonas for which she was treated with Avycaz and mycin nebulizers at [facility]. Possible ongoing aspiration also.   Presented with hemoptysis, aspiration of blood.  Patient has cuffed trach placed by ENT.  No active bleeding on bronchoscopy.   Continue attempts at weaning vent goal is to progress to trach  collar.  He also underwent tracheostomy tube change by PCCM for persistent leak on 03/01/20. Chest x-ray done on 03/03/2020 showed bilateral pleural effusions, stable extensive opacity throughout the left hemithorax. Currently he is on trach collar, requiring 7 L of oxygen per minute.. Per PCCm, trach collar may be destination for pt. duonebs +CPT Continue abx  Paroxysmal A. fib: Currently rate is controlled.  Anticoagulation was discontinued due to suspected GI bleed.  C urrently on low-dose Eliquis.  On rate control with Cardizem.  Sepsis/bacteremia: Enterococcus and Staphylococcus epidermidis bacteremia.  Also has osteomyelitis fifth sacral segment.    High suspicion for ongoing aspiration.    Infectious disease consulted and was following.   MRI pelvis/sacrum showed osteomyelitis of fifth sacral segment, edema in the muscle around both hips and proximal thighs with possible myositis.   Currently on vancomycin,fortaz, Flagyl.   IR consulted for bone biopsy but due to high risk  it was not done. ID recommended treatment for 6 weeks for sacral osteomyelitis and staph epidermidis bacteremia with vancomycin, fortaz and metronidazole. He has persistent  leukocytosis.  Continue to monitor CBC.   ESRD: On dialysis per nephrology.  Underwent  tunnel catheter placement on 6/2.   HD in am   Chronic normocytic anemia: Secondary to ESRD, critical illness.  Has been transfused with PRBCs.  Currently hemoglobin stable.  Continue to monitor  Unstageable sacral pressure ulcer/osteomyelitis: Wound care following.  Discussion as above.  Cachexia/severe protein malnutrition: On tube feeds.  Nutrition following.  Mild elevated LFTs: Continue to monitor.  Disposition: Patient is from [place].  Anticipate discharge to LTAC/SNF but due to lack of insurance, placement has been difficult.  Social working Sports administrator.  Goals of care: DNR.  He has poor prognosis due to post Covid fibrosis, deconditioning,  multiple episodes of healthcare associated  pneumonia.  We have requested for palliative  care evaluation.plz see note  Pressure Injury 02/14/20 Sacrum  Medial Stage 4 - Full thickness tissue loss with exposed bone, tendon or muscle. (Active)  02/14/20 1906  Location: Sacrum  Location Orientation: Medial  Staging: Stage 4 - Full thickness tissue loss with exposed bone, tendon or muscle.  Wound Description (Comments):   Present on Admission: Yes     Pressure Injury 03/03/20 Neck Anterior Stage 3 -  Full thickness tissue loss. Subcutaneous fat may be visible but bone, tendon or muscle are NOT exposed. Device related injury under trach tube.  (Active)  03/03/20 1805  Location: Neck  Location Orientation: Anterior  Staging: Stage 3 -  Full thickness tissue loss. Subcutaneous fat may be visible but bone, tendon or muscle are NOT exposed.  Wound Description (Comments): Device related injury under trach tube.   Present on Admission: No   Nutrition Problem: Severe Malnutrition Etiology: chronic illness(chronic respiratory failure related to COVID ARDS)  DVT prophylaxis: Eliquis Code Status: DNR Family Communication: None at bedside Disposition Plan:  Status is: Inpatient  Remains inpatient appropriate because:Unsafe d/c plan   Dispo: The patient is from: [facility]  Anticipated d/c is to: SNF/LTAC  Anticipated d/c date is: Unknown     LOS: 22 days   Time spent:45 min with >50% on coc    Nolberto Hanlon, MD [facility] Pager [phone]  If 7PM-7AM, please contact night-coverage [email or web address] Password [facility] 03/07/2020, 8:10 AM Patient ID: Shane Banks, male   DOB: [birth year], 74 y.o.   MRN: [id number]

## 2020-03-07 NOTE — Progress Notes (Addendum)
Attending addendum 73/06/2020  Seen and examined in f/u for trach wean.  Doing well this AM. Denies pain or SOB. Remains on TC Has PMV in place, speaking Strong cough Continued bilateral rhonci and profound muscle weakess  A: Post COVID fibrosis and deconditioning now s/p trach Enterococcus/ MRSE bacteremiaHD cath sepsis Sacral osteo/ decub stage IV S/p PEG, diverting ostomy Afib  P: Continue TC and current pulmonary toileting measures PMV as tolerated Not ready to downsize Will follow with you  Erskine Emery MD PCCM        NAME:  Shane Banks, MRN:  [id number], DOB:  03-28-1946, LOS: 34 ADMISSION DATE:  02/14/2020, CONSULTATION DATE:  5/19 REFERRING MD:  Dr. Alvino Chapel, CHIEF COMPLAINT:  Hemoptysis   Brief History   74 yo m with PMHx of afib with RVR, BPH, stroke, and COVID ARDS in January requiring trach and LTACH placement at [facility]. Had been tolerating trach collar, but on 5/19 developed hemoptysis and hypoxemic respiratory failure requiring vent. Transferred to [facility] on 5/19.  Past Medical History   has a past medical history of Acute on chronic respiratory failure with hypoxia ([place]), Atrial fibrillation with RVR ([place]), BPH (benign prostatic hyperplasia), COVID-19 virus infection, Pneumonia due to COVID-19 virus, Severe sepsis ([place]), and Stroke ([place]).  Significant Hospital Events   1/8  Admit to [facility] for COVID PNA 2/23 Transfer to [facility] on vent 5/19 Transfer to [facility] for hemoptysis. #6 cuffed trach placed. Back on vent. Bronch performed 5/20 Transitioned to trach collar.  5/22 back on vent full time 5/24 back on antibiotics for fevers 6/1 Initiating PSV weans 6/3 Large cuff leak, trach exchange 6/7 transitioned to [facility] 6/9 remains on TC continuously since 6/7  Consults:  ENT Nephrology ID PCCM  Procedures:  ENT flex scope 5/19 > no bleeding source identified FOB 5/19 > no pulmonary bleeding source identified.    Significant Diagnostic Tests:  CXR 5/28 >> R CVC placement confirmed Slightly improved aeration at the right base. 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 Edema in the muscles around the hips and in the posterior paraspinal musculature of the lower lumbar spine and in the buttocks which could represent myositis. Fluid-fluid level in the bladder may represent protein or debris in the bladder. The possibility of urinary tract infection should be considered.  CXR 6/3> improved R lung aeration, worsening L sided opacity. Tracheostomy tube, HD catheter.   CXR 6/4>wosened R lung infiltrates and stable L sided infiltrate. Possible bilateral pleural effusions  Micro Data:    tracheal aspirate 5/7> for Pseudomonas A  BAL 5/19 > no growth 5/19 blood > NG 5/22 trach aspirate>> candida [place] 5/22 blood>> 1/4 GPC> staph epi 5/25 blood>> GPC (enterococcus on biofire) 2/4 bottles>> E. Faecalis, staph epi. 5/25 trach aspirate>>many PMN, few GPR, rare GPC> diphtheroids 5/25 blood cx>> + for Enterococcus Faecalis (gent resistant)/ Staphylococcus Epidermidis (tetracycline, vanc, rifampin sensitive) 5/27 BCx> ngtd    Antimicrobials:  Levofloxacin 5/24>5/26 vanc 5/25> Ceftriaxone  5/27>6/1 Metronidazole 5/27>  [name] 6/1>  Interim history/subjective:  Doing great on trach collar. Started with PMSV yesterday and tolerating very well. Phonates well.   Objective   Blood pressure (!) 141/95, pulse (!) 103, temperature 98.4 F (36.9 C), temperature source Axillary, resp. rate (!) 42, height _0  (1.753 m), weight 59.3 kg, SpO2 100 %.    FiO2 (%):  [  28 %] 28 %   Intake/Output Summary (Last 24 hours) at 03/07/2020 0910 Last data filed at 03/07/2020 0800 Gross per 24 hour  Intake 1240 ml  Output 150 ml  Net 1090 ml    Filed Weights   03/05/20 0348 03/06/20 0431 03/07/20 0438  Weight: 56.1 kg 57.2 kg 59.3 kg    Examination:   General: Frail elderly male Neuro:  Awake, alert, oriented to self.  HEENT:  [place]/AT, No JVD noted, PERRL Cardiovascular:  RRR, no MRG Lungs:  Clear bilateral breath sounds Abdomen:  Soft, non-distended, non-tender.  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  - Continue trach collar as tolerated.  - Continue working with [place] and overall frailty currently would prevent decannulation.  - Duonebs + CPT - Abx per primary team - Volume removal as tolerated.  - Family endorsees ongoing aggressive care including vent and HD per palliative note.   Other issues per rimary Enterococcus/ MRSE bacteremia HD cath sepsis  Sacral osteo/ decub stage IV  Afib S/p PEG, diverting ostomy  Best practice:  Per primary team   Georgann Housekeeper, AGACNP-BC [place] for personal pager PCCM on call pager [phone]  03/07/2020 9:10 AM

## 2020-03-07 NOTE — Progress Notes (Signed)
  Speech Language Pathology Treatment: Shane Banks Speaking valve  Patient Details Name: Shane Banks MRN: [id number] DOB: 13-Apr-1946 Today's Date: 03/07/2020 Time: 1030-1040 SLP Time Calculation (min) (ACUTE ONLY): 10 min  Assessment / Plan / Recommendation Clinical Impression  Pt is tolerating PMSV use with staff supervision, expressing basic wants and needs. His RR is in the 40s on the vent, on trach collar and with PMSV in place per notes. Pt answers Y/N questions, but struggles with orientation or basic biographical information. He often asks for water. Notes from [facility] are not available but sister states he has not worked on swallowing in the past. He would be an appropriate candidate for a swallow eval at this point, given his desire to drink, appropriate cognition and tolerance of PMSV. Discussed with MD  HPI HPI: 74 yo m with PMHx of afib with RVR, BPH, stroke, and COVID ARDS in January requiring trach and LTACH placement at [facility]. Had been tolerating trach collar, but on 5/19 developed hemoptysis and hypoxemic respiratory failure requiring vent. Transferred to [facility] on 5/19. PMSV desired for discussion with pt regarding goals of care.      SLP Plan  Continue with current plan of care       Recommendations                   Plan: Continue with current plan of care       GO               [name], MA [place] Pager [phone] Office [phone]  [name] 03/07/2020, 1:34 PM

## 2020-03-07 NOTE — Progress Notes (Signed)
Pharmacy Antibiotic Note  Shane Banks is a 74 y.o. male admitted on 02/14/2020 with CoNS/E.faecalis bacteremia/sacral osteomyelitis/pneumonia. 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 Line pulled 5/26, line holiday and had it replaced 5/28. TTE negative. Repeat BCx are negative. ID planning 6 weeks of therapy.  Vancomycin pre-HD level is therapeutic at 20 mcg/mL (goal 15-25 mcg/mL).  Not on MWF schedule currently and next HD session will be on Sat 03/09/20.  Afebrile, WBC up to 25.6.  Plan: Vanc 573m IV qHD  Fortaz  2gm IV qHD Flagyl 5037mPT Q8H per MD Monitor HD schedule/tolerance, weekly vanc trough Abx end date - 04/04/20 per ID   Height: _0  (175.3 cm) Weight: 59 kg (130 lb 1.1 oz) IBW/kg (Calculated) : 70.7  Temp (24hrs), Avg:98.7 F (37.1 C), Min:97.8 F (36.6 C), Max:99.2 F (37.3 C)  Recent Labs  Lab 03/03/20 0257 03/04/20 0220 03/04/20 1255 03/05/20 0253 03/06/20 0249 03/07/20 0307 03/07/20 1000  WBC 21.9* 23.9*  --  22.8* 23.7* 25.6*  --   CREATININE 1.93*  --  3.37* 1.83* 2.88* 3.90*  --   VANCORANDOM  --   --   --   --   --   --  20    Estimated Creatinine Clearance: 14.3 mL/min (A) (by C-G formula based on SCr of 3.9 mg/dL (H)).    Allergies  Allergen Reactions  . Aspirin Rash  . Cephalexin Other (See Comments)    dizziness  . Penicillins     Itching,swelling     5/19 Cefepime>>5/19 5/19 Vanc>>5/19; 5/25>>7/8 5/27 CTX >> 6/1 5/27 flagyl >>7/8 6/1 ceftaz >>7/8  5/31 VR = 21 mcg/mL 6/3 VR = 36 (1gm in IR w/o HD) >> ~20 mcg/mL post HD >> hold 6/3, restart 6/5 HD 6/10 pre-HD VL = 20 mcg/mL >> no change   5/19 Bld -neg 5/19 BAL - neg 5/21 MRSA pcr negative 5/22 BCx - 1/2 CoNS MECA 5/22 TA - rare candida parapsilosis 5/25 TA - abundant corynebacterium striatum 5/25 BCx - MRSE and E.faecalis 5/27 BCx - negative 6/6 BCx - NGTD  Shane Ishida D. DaMina MarblePharmD, BCPS, [place]/06/2020, 12:20 PM

## 2020-03-07 NOTE — Progress Notes (Signed)
Palliative Medicine RN Note: Patient is on PMT list for follow up today or tomorrow. Sister [name] [phone]) left a voicemail on team phone this morning stating that family had a meeting and that they wish to continue all aggressive care, including vent and dialysis.   We have not assigned a provider for follow up Tacey Ruiz is off-service today). Once we have someone assigned, we will follow up with her.  Marjie Skiff Cecil Vandyke, RN, BSN, [facility] Palliative Medicine Team 03/07/2020 8:47 AM Office [phone]

## 2020-03-07 NOTE — Progress Notes (Signed)
Occupational Therapy Evaluation Patient Details Name: Shane Banks MRN: [id number] DOB: October 10, 1945 Today's Date: 03/07/2020    History of Present Illness 74 yo m with PMHx of afib with RVR, BPH, stroke, and COVID ARDS in January requiring trach and LTACH placement at [facility]. Had been tolerating trach collar, but on 5/19 developed hemoptysis and hypoxemic respiratory failure requiring vent. Transferred to [facility] on 5/19.  5/19 back on vent, 6/7 transitioned to TC   Clinical Impression   Per nsg, prior to becoming ill with Covid, pt lived at home with his girlfriend. Has been at [facility] - unsure of functional level. Pt presents with multiple contractures, sever deconditioning and apparent cognitive deficits. Pt inconsistently following commands this session. Not moving BUE this date, however nsg states he has squeezed both of his hands. May benefit from L resting hand splint - will monitor. Did not transfer OOB to chair due to pt having zero trunk control and L lateral lean. Left in chair position in bed. HR 121; SpO2 98-100 25% FiO2 5L; BP 102/56 (sitting); RR 25. Recommend pt be placed in chair position TID. Will follow acutely to facilitate DC to next venue of care.     Follow Up Recommendations  SNF;LTACH;Supervision/Assistance - 24 hour    Equipment Recommendations  Other (comment) (TBA)    Recommendations for Other Services       Precautions / Restrictions Precautions Precautions: Fall Precaution Comments: TC continuously on 28% FiO2; Peg; colostomy; Dialysis catheter - L IJ      Mobility Bed Mobility Overal bed mobility: Needs Assistance             General bed mobility comments: total A +2  Transfers                 General transfer comment: will need lift    Balance Overall balance assessment: Needs assistance   Sitting balance-Leahy Scale: Zero Sitting balance - Comments: L lateral lean; no attempts to reposition                                    ADL either performed or assessed with clinical judgement   ADL Overall ADL's : Needs assistance/impaired Eating/Feeding: NPO                                     General ADL Comments: total A all ADL tasks     Vision Patient Visual Report:  (will further assess)       Perception Perception Comments: will further assess   Praxis Praxis Praxis-Other Comments: poor initiation    Pertinent Vitals/Pain Pain Assessment: Faces Faces Pain Scale: Hurts whole lot Pain Location: with ROM BUE Pain Descriptors / Indicators: Grimacing Pain Intervention(s): Limited activity within patient's tolerance;Repositioned     Hand Dominance     Extremity/Trunk Assessment Upper Extremity Assessment Upper Extremity Assessment: RUE deficits/detail;LUE deficits/detail RUE Deficits / Details: no active movement observed; scapula rigid with poor glide/ROM; able to mobilize with soft tissue mob howevr not WFL; elbow contracture in extension - @ 30 degrees total A @ elbow. No active movement observed at hand although nsg states he squeezed her hand earlier; limited MP ext4ension @ 20 degrees however ablet o achieve full gross grasp; shoulder PROM to @ 30 - not ranged beyond due to tight scapula and shoulder contracture RUE Coordination:  decreased fine motor;decreased gross motor LUE Deficits / Details: holding hand in flexed /fisted position - able to extend to @ 75 degrees - contracture; elbow WFL; shoulder to @ 45 degrees; tight scapula similar to R, however not as tight LUE Coordination: decreased fine motor;decreased gross motor   Lower Extremity Assessment Lower Extremity Assessment: Defer to PT evaluation   Cervical / Trunk Assessment Cervical / Trunk Assessment: Kyphotic;Other exceptions Cervical / Trunk Exceptions: rigid trunk with poor dissociation of rib cage from hips; rolls as unit   Communication Communication Communication: Expressive  difficulties;Passy-Muir valve   Cognition Arousal/Alertness: Awake/alert Behavior During Therapy: Flat affect Overall Cognitive Status: Difficult to assess                                 General Comments: inconsistently following commands; nsg states he has been following commands for her more consistently; may be affected by fatigue as pt had dialysis this am   General Comments       Exercises Exercises: Other exercises Other Exercises Other Exercises: Scapular ROM in all planes followed by BUE PROM within tolerated ROM Other Exercises: cervical rotation and lateral flexion   Shoulder Instructions      Home Living Family/patient expects to be discharged to:: Skilled nursing facility [facility])                                        Prior Functioning/Environment Level of Independence: Needs assistance  Gait / Transfers Assistance Needed: Since stroke has been limited, but functional  until he contracted Covid.              OT Problem List: Decreased strength;Decreased range of motion;Decreased activity tolerance;Impaired balance (sitting and/or standing);Impaired vision/perception;Decreased coordination;Decreased cognition;Decreased safety awareness;Decreased knowledge of use of DME or AE;Decreased knowledge of precautions;Cardiopulmonary status limiting activity;Impaired UE functional use;Pain      OT Treatment/Interventions: Self-care/ADL training;Therapeutic exercise;Neuromuscular education;DME and/or AE instruction;Therapeutic activities;Splinting;Cognitive remediation/compensation;Patient/family education;Visual/perceptual remediation/compensation;Balance training    OT Goals(Current goals can be found in the care plan section) Acute Rehab OT Goals Patient Stated Goal: pt unable to participate OT Goal Formulation: Patient unable to participate in goal setting Time For Goal Achievement: 03/21/20 Potential to Achieve Goals: Fair  OT  Frequency: Min 2X/week   Barriers to D/C:            Co-evaluation              AM-PAC OT "6 Clicks" Daily Activity     Outcome Measure Help from another person eating meals?: Total Help from another person taking care of personal grooming?: Total Help from another person toileting, which includes using toliet, bedpan, or urinal?: Total Help from another person bathing (including washing, rinsing, drying)?: Total Help from another person to put on and taking off regular upper body clothing?: Total Help from another person to put on and taking off regular lower body clothing?: Total 6 Click Score: 6   End of Session Equipment Utilized During Treatment: Oxygen (5L FiO2) Nurse Communication: Mobility status  Activity Tolerance: Patient tolerated treatment well Patient left: in bed;with call bell/phone within reach;with SCD's reapplied;with bed alarm set;Other (comment) (chair position)  OT Visit Diagnosis: Unsteadiness on feet (R26.81);Other abnormalities of gait and mobility (R26.89);Muscle weakness (generalized) (M62.81);Other symptoms and signs involving cognitive function;Pain Pain - Right/Left: Right (Left) Pain - part of  body: Shoulder;Arm;Hand;Hip;Knee;Leg                Time: 7737-3668 OT Time Calculation (min): 28 min Charges:  OT General Charges $OT Visit: 1 Visit OT Evaluation $OT Eval High Complexity: 1 High OT Treatments $Therapeutic Exercise: 8-22 mins  Maurie Boettcher, OT/L   Acute OT Clinical Specialist Acute Rehabilitation Services Pager [phone] Office [phone]   [facility] 03/07/2020, 3:30 PM

## 2020-03-07 NOTE — Progress Notes (Signed)
[facility] Progress Note  Subjective: has remained on TC.  I/Os yest +1.3 On HD now  Palliative care d/w family yesterday - notes reviewed, for now cont current level of care.  Vitals:   03/07/20 0700 03/07/20 0751 03/07/20 0800 03/07/20 0803  BP: (!) 143/70  (!) 141/95   Pulse: (!) 51 (!) 114 (!) 103   Resp: (!) 28 (!) 42 (!) 42   Temp:    98.4 F (36.9 C)  TempSrc:    Axillary  SpO2: 98% 100% 100%   Weight:      Height:        Exam:   In ICU on TC, speaking valve in place and talking some  no jvd  Chest cta bilat ant/ lat  Cor reg no RG  Abd soft ntnd no ascites, +llq ostomy, + peg tube   Ext  No edema   Neuro: as above     Summary: came from [facility] to [facility] after COVID hospitalization which included mech ventilation and trach placement. At [facility] had severe infections/ bacteremia/ PNA and developed AKI felt due to IV gent toxicity requiring initiation of HD 12/16/19. Admitted to ICU [facility] for trach bleed on 5/19. Was getting MWF HD at [facility] prior to admit here.   OP HD: started March 2021 at [facility] as above MWF   Getting HD here 3.5- 4h, UF 0.5- 1.5 L, hep 2000 prn   admit 62.8kg > range 56.9- 64.7kg here  Assessment/ Plan: 1. 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 On IV abx. Get volume down as tol w/ HD. Doing fairly well on TC now.  2. Enterococcus/ MRSE bacteremia HD cath sepsis - TDC removed 5/26, per ID getting 6 wks vanc/ fortaz/ flagyl for polymicrobial sepsis and osteomyelitis.  3. Sacral osteo/ decub stage IV - per ID, also abx as above 4. ESRD/ AKI - on HD since mid March 2021, d/t gent toxicity. HD MWF at [facility]. S/P new TDC 6/2 by IR.  HD today, plan next Sat.   5. Trach bleed - resolved 6. Tracheobronchitis - sp course of doxycycline 7. AMS - variable, seems overall improved 8. Atrial fib - per primary, on eliquis 2.5 BID 9. Anemia ckd - transfuse prn 10. Hypercalcemia:  w/ low PTH, prob from  immobility, corrCa 11.0 > 10.5>10.3, improved. Cont low 2.0 Ca++ bath , PTH 29 11. BP/vol - no sig vol excess, down almost 9kg from admit, no vol^ on exam 12. SP peg/ divert colostomy 13. SP COVID pna - original problem in Feb at OSH 14. DNR, poor prognosis, LTACH only option for disposition given needs but apparently not a candidate.  Appreciate palliative care getting involved, their note was reviewed.  Hopefully patient can participate in the discussion to take the burden off family to make more palliative decisions --> he has a speaking valve in place now   Jannifer Hick MD [place] Pager [phone]   Recent Labs  Lab 03/04/20 1255 03/05/20 0253 03/06/20 0249 03/07/20 0307  K 3.1*   < > 3.8 3.8  BUN 117*   < > 98* 147*  CREATININE 3.37*   < > 2.88* 3.90*  CALCIUM 9.8   < > 9.9 10.4*  PHOS 3.2  --   --   --   HGB  --    < > 9.2* 9.2*   < > = values in this interval not displayed.   Inpatient medications: .  heparin sodium (porcine)      . apixaban  2.5 mg Per Tube BID  . chlorhexidine gluconate (MEDLINE KIT)  15 mL Mouth Rinse BID  . Chlorhexidine Gluconate Cloth  6 each Topical Q0600  . feeding supplement (PRO-STAT SUGAR FREE 64)  30 mL Per Tube BID  . insulin aspart  0-6 Units Subcutaneous Q4H  . insulin aspart  2 Units Subcutaneous Q4H  . ipratropium-albuterol  3 mL Nebulization QID  . levothyroxine  25 mcg Per Tube Q0600  . mouth rinse  15 mL Mouth Rinse 10 times per day  . metroNIDAZOLE  500 mg Per Tube Q8H  . nutrition supplement (JUVEN)  1 packet Per Tube BID BM  . pantoprazole sodium  40 mg Per Tube Daily  . vancomycin  500 mg Intravenous Q T,Th,Sa-HD   . sodium chloride    . sodium chloride    . sodium chloride Stopped (03/04/20 1524)  . cefTAZidime (FORTAZ)  IV    . feeding supplement (NEPRO CARB STEADY) 1,000 mL (03/07/20 0404)   sodium chloride, sodium chloride, sodium chloride, acetaminophen (TYLENOL) oral liquid 160 mg/5 mL, alteplase,  docusate, fentaNYL (SUBLIMAZE) injection, heparin, heparin, heparin, heparin, lidocaine (PF), lidocaine-prilocaine, midazolam, midazolam, pentafluoroprop-tetrafluoroeth, polyethylene glycol

## 2020-03-08 LAB — CULTURE, BLOOD (ROUTINE X 2)
Culture: NO GROWTH
Culture: NO GROWTH
Special Requests: ADEQUATE
Special Requests: ADEQUATE

## 2020-03-08 LAB — GLUCOSE, CAPILLARY
Glucose-Capillary: 146 mg/dL — ABNORMAL HIGH (ref 70–99)
Glucose-Capillary: 160 mg/dL — ABNORMAL HIGH (ref 70–99)
Glucose-Capillary: 136 mg/dL — ABNORMAL HIGH (ref 70–99)
Glucose-Capillary: 145 mg/dL — ABNORMAL HIGH (ref 70–99)
Glucose-Capillary: 148 mg/dL — ABNORMAL HIGH (ref 70–99)
Glucose-Capillary: 154 mg/dL — ABNORMAL HIGH (ref 70–99)

## 2020-03-08 LAB — CBC WITH DIFFERENTIAL/PLATELET
Abs Immature Granulocytes: 0.4 10*3/uL — ABNORMAL HIGH (ref 0.00–0.07)
Basophils Absolute: 0.2 10*3/uL — ABNORMAL HIGH (ref 0.0–0.1)
Basophils Relative: 1 %
Eosinophils Absolute: 0.6 10*3/uL — ABNORMAL HIGH (ref 0.0–0.5)
Eosinophils Relative: 3 %
HCT: 31.1 % — ABNORMAL LOW (ref 39.0–52.0)
Hemoglobin: 9.5 g/dL — ABNORMAL LOW (ref 13.0–17.0)
Immature Granulocytes: 2 %
Lymphocytes Relative: 12 %
Lymphs Abs: 3.1 10*3/uL (ref 0.7–4.0)
MCH: 25.6 pg — ABNORMAL LOW (ref 26.0–34.0)
MCHC: 30.5 g/dL (ref 30.0–36.0)
MCV: 83.8 fL (ref 80.0–100.0)
Monocytes Absolute: 1.9 10*3/uL — ABNORMAL HIGH (ref 0.1–1.0)
Monocytes Relative: 8 %
Neutro Abs: 18.8 10*3/uL — ABNORMAL HIGH (ref 1.7–7.7)
Neutrophils Relative %: 74 %
Platelets: 342 10*3/uL (ref 150–400)
RBC: 3.71 MIL/uL — ABNORMAL LOW (ref 4.22–5.81)
RDW: 19.4 % — ABNORMAL HIGH (ref 11.5–15.5)
WBC: 25 10*3/uL — ABNORMAL HIGH (ref 4.0–10.5)
nRBC: 0 % (ref 0.0–0.2)

## 2020-03-08 NOTE — Progress Notes (Signed)
  Speech Language Pathology Treatment: Nada Boozer Speaking valve  Patient Details Name: Shane Banks MRN: [id number] DOB: 01/28/1946 Today's Date: 03/08/2020 Time: 0900-0920 SLP Time Calculation (min) (ACUTE ONLY): 20 min  Assessment / Plan / Recommendation Clinical Impression  Pt had remained on ATC for over 48 hours. Upon arrival to room he had cuff deflated, but no PMSV in place yet, gurgling on tracheal secretions. When valve placed, pt immediately orally expectorated secretions and sustained a clear airway for several minutes. He occasionally coughs or clears his throat, senses secretions and swallows, which is appropriate secretion and airway management. Pt tolerated PMSV in place with HD RN supervising for several hours yesterday without incident. Recommend pt continue to wear PMSV with close intermittent staff supervision while on 16M. Will f/u for ongoing tolerance and progress.   HPI        SLP Plan  Continue with current plan of care       Recommendations         Patient may use Passy-Muir Speech Valve: During all waking hours (remove during sleep) PMSV Supervision: Intermittent         Oral Care Recommendations: Oral care QID Follow up Recommendations: LTACH SLP Visit Diagnosis: Aphonia (R49.1) Plan: Continue with current plan of care       GO              [name], MA [place] Pager [phone] Office [phone]   [name] 03/08/2020, 12:20 PM

## 2020-03-08 NOTE — Progress Notes (Signed)
   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 Had been tolerating trach collar, but on 5/19 developed hemoptysis and hypoxemic respiratory failure requiring vent. Transferred to [facility] on 5/19.  Patient has been at [facility] since January.  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 Patient is bed-bound and has been so for the past six months causing a notable sacral decubitus ulcerations leading to OM. 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   Today's Discussion (03/08/2020): Chart reviewed. I was able to speak to patient sister, [name] this afternoon. She shares that she does not feel the need for [place] to meet formally as she discussed Melesio's case with her family. They all would like to continue the current course of care. They felt at these are the interventions [name] would want for himself.   I met with Milbert Coulter at bedside, he did have the passy muir on by his phonation was poor. He did nod yes when I reviewed his present health situation and whether or not he wants [place] to continue his care as is. I shared my worries with him for decline and the inability to function independently ever again. I asked if this is considered an acceptable quality of life for him. He was noted to shut his eyes and take quite a few minutes. He did not respond. I worry he may have some degree of ICU associated delirium and is not comprehending these conversations well.    Discussed the importance of continued conversation with family and their  medical providers regarding overall plan of care and treatment options, ensuring decisions are within the context of the patients values and GOCs.  Questions and concerns addressed   SUMMARY OF RECOMMENDATIONS DNR, already has tracheostomy in place  Patients family would like to continue current course of care, at the present time they feel strongly that this is what the patient would want for himself. Passy Muir valve is in place though patients phonation is difficult to interpret. He is able to nod yes when asked questions about continuation of care though his capacity to understand these questions is of concern and hard to assess under the present circumstances.   PMT to provide ongoing support, it appears that there may be a lack of understanding in terms of illness severity  Time Spent: 35 Greater than 50% of the time was spent in counseling and coordination of care ______________________________________________________________________________________ [place] Team Team Cell Phone: [phone] Please utilize secure chat with additional questions, if there is no response within 30 minutes please call the above phone number  Palliative Medicine Team providers are available by phone from 7am to 7pm daily and can be reached through the team cell phone.  Should this patient require assistance outside of these hours, please call the patient's attending physician.

## 2020-03-08 NOTE — Progress Notes (Signed)
PROGRESS NOTE    Shane Banks  ZOX:[id number] DOB: January 20, 1946 DOA: 02/14/2020 PCP: [name], MD    Brief Narrative:  Patient is a 74 year old male with past medical history of chronic hypoxic respiratory failure status post tracheostomy, ESRD on dialysis, atrial fibrillation, nonhemorrhagic stroke who was admitted from [place] for the evaluation of trach site bleeding.  Patient had recent history of Covid pneumonia and was on ventilator because he progressed to ARDS.  Because of little improvement in the ventilatory and oxygenation, he underwent tracheostomy and was discharged to [facility].  He had recent history of UTI, multidrug-resistant Pseudomonas pneumonia, stenotrophomonas bacteremia.  Because of sepsis and shock, he developed renal failure requiring dialysis.  On 5/19, he developed hemoptysis and was transferred to [facility] ED for evaluation.  PCCM,ID,Nephrology following here.  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  He also underwent tracheostomy tube change by PCCM for persistent leak.  Due to lack of insurance, he cannot go back to [place].  Social worker assisting on placement.  Anticipate prolonged hospitalization.    Consultants:   palliative ENT Nephrology ID PCCM  Procedures: ENT flex scope 5/19 > no bleeding source identified FOB 5/19 >no pulmonary bleeding source identified. 6/2: Tunnel catheter placement by IR. 6/4: Changed trach tube  Antimicrobials:   Metronidazole, vancomycin, Fortaz   Subjective: No issues per nursing.  Laying in bed sleepy this a.m.  Does wake up opens his eyes when I call his name.  Objective: Vitals:   03/08/20 0500 03/08/20 0600 03/08/20 0700 03/08/20 0748  BP: 135/62 122/74 (!) 113/57   Pulse: (!) 109 (!) 45  (!) 113  Resp: (!) 39 (!) 39 (!) 35 (!) 38  Temp:   99 F (37.2 C)   TempSrc:   Axillary   SpO2: 96% 100%  98%  Weight:      Height:        Intake/Output Summary (Last 24 hours) at  03/08/2020 0819 Last data filed at 03/08/2020 0600 Gross per 24 hour  Intake 1000 ml  Output 500 ml  Net 500 ml   Filed Weights   03/07/20 0930 03/07/20 1245 03/08/20 0437  Weight: 59 kg 58.5 kg 56.4 kg    Examination:  General exam: Appears calm and comfortable , on trach. ,  Lying in bed sleepy does open eyes when called his name Respiratory system: Clear to auscultation anteriorly, no wheezing, or rhonchi's Cardiovascular system: S1 & S2 heard, RRR. No JVD, murmurs, rubs, gallops or clicks.  Gastrointestinal system: Abdomen is nondistended, soft and nontender. Normal bowel sounds heard. Central nervous system: Grossly appears intact, awake when called name Extremities: trace edema Skin: warm, dry Psych: Mood appropriate for current setting    Data Reviewed: I have personally reviewed following labs and imaging studies  CBC: Recent Labs  Lab 03/04/20 0220 03/05/20 0253 03/06/20 0249 03/07/20 0307 03/08/20 0530  WBC 23.9* 22.8* 23.7* 25.6* 25.0*  NEUTROABS 17.3* 16.6* 17.0* 19.1* 18.8*  HGB 9.0* 8.6* 9.2* 9.2* 9.5*  HCT 28.7* 28.0* 29.8* 29.1* 31.1*  MCV 83.2 83.6 83.9 82.7 83.8  PLT 346 312 347 360 811   Basic Metabolic Panel: Recent Labs  Lab 03/03/20 0257 03/03/20 0257 03/03/20 1032 03/04/20 1255 03/05/20 0253 03/06/20 0249 03/07/20 0307  NA 135   < > 138 136 135 137 140  K 3.9   < > 3.8 3.1* 3.4* 3.8 3.8  CL 97*  --   --  97* 98 99 102  CO2 26  --   --  26 28 26 26   GLUCOSE 144*  --   --  142* 139* 137* 132*  BUN 53*  --   --  117* 51* 98* 147*  CREATININE 1.93*  --   --  3.37* 1.83* 2.88* 3.90*  CALCIUM 9.2  --   --  9.8 9.2 9.9 10.4*  PHOS  --   --   --  3.2  --   --   --    < > = values in this interval not displayed.   GFR: Estimated Creatinine Clearance: 13.7 mL/min (A) (by C-G formula based on SCr of 3.9 mg/dL (H)). Liver Function Tests: Recent Labs  Lab 03/04/20 1255  ALBUMIN 2.1*   No results for input(s): LIPASE, AMYLASE in the last  168 hours. No results for input(s): AMMONIA in the last 168 hours. Coagulation Profile: No results for input(s): INR, PROTIME in the last 168 hours. Cardiac Enzymes: No results for input(s): CKTOTAL, CKMB, CKMBINDEX, TROPONINI in the last 168 hours. BNP (last 3 results) No results for input(s): PROBNP in the last 8760 hours. HbA1C: No results for input(s): HGBA1C in the last 72 hours. CBG: Recent Labs  Lab 03/07/20 1533 03/07/20 1912 03/07/20 2314 03/08/20 0316 03/08/20 0718  GLUCAP 154* 145* 134* 146* 160*   Lipid Profile: No results for input(s): CHOL, HDL, LDLCALC, TRIG, CHOLHDL, LDLDIRECT in the last 72 hours. Thyroid Function Tests: No results for input(s): TSH, T4TOTAL, FREET4, T3FREE, THYROIDAB in the last 72 hours. Anemia Panel: No results for input(s): VITAMINB12, FOLATE, FERRITIN, TIBC, IRON, RETICCTPCT in the last 72 hours. Sepsis Labs: No results for input(s): PROCALCITON, LATICACIDVEN in the last 168 hours.  Recent Results (from the past 240 hour(s))  Culture, blood (routine x 2)     Status: None   Collection Time: 03/03/20  9:13 AM   Specimen: BLOOD RIGHT HAND  Result Value Ref Range Status   Specimen Description BLOOD RIGHT HAND  Final   Special Requests   Final    BOTTLES DRAWN AEROBIC ONLY Blood Culture adequate volume   Culture   Final    NO GROWTH 5 DAYS Performed at [facility], [address] [place], [place] [address]    Report Status 03/08/2020 FINAL  Final  Culture, blood (routine x 2)     Status: None   Collection Time: 03/03/20  9:13 AM   Specimen: BLOOD LEFT HAND  Result Value Ref Range Status   Specimen Description BLOOD LEFT HAND  Final   Special Requests   Final    BOTTLES DRAWN AEROBIC ONLY Blood Culture adequate volume   Culture   Final    NO GROWTH 5 DAYS Performed at [facility], [place] [address] [place], [place] [address]    Report Status 03/08/2020 FINAL  Final         Radiology Studies: No results  found.      Scheduled Meds: . apixaban  2.5 mg Per Tube BID  . chlorhexidine gluconate (MEDLINE KIT)  15 mL Mouth Rinse BID  . Chlorhexidine Gluconate Cloth  6 each Topical Q0600  . feeding supplement (PRO-STAT SUGAR FREE 64)  30 mL Per Tube BID  . insulin aspart  0-6 Units Subcutaneous Q4H  . insulin aspart  2 Units Subcutaneous Q4H  . ipratropium-albuterol  3 mL Nebulization QID  . levothyroxine  25 mcg Per Tube Q0600  . mouth rinse  15 mL Mouth Rinse 10 times per day  . metroNIDAZOLE  500 mg Per  Tube Q8H  . nutrition supplement (JUVEN)  1 packet Per Tube BID BM  . pantoprazole sodium  40 mg Per Tube Daily  . vancomycin  500 mg Intravenous Q T,Th,Sa-HD   Continuous Infusions: . sodium chloride    . sodium chloride    . sodium chloride Stopped (03/04/20 1524)  . cefTAZidime (FORTAZ)  IV 2 g (03/07/20 1751)  . 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  Status post trach due to respiratory failure from Covid pneumonia.  He is on trach collar. PCCM following.  Most recently pneumonia has been multidrug-resistant Pseudomonas for which she was treated with Avycaz and [facility]cin [facility]bulizers [facility]. Possible ongoing aspiration also.   Presented with hemoptysis, aspiration of blood.  Patient has cuffed trach placed by ENT.  No active bleeding on bronchoscopy.   Continue attempts at weaning vent goal is to progress to trach collar.  He also underwent  tracheostomy tube change by PCCM for persistent leak on 03/01/20. Chest x-ray done on 03/03/2020 showed bilateral pleural effusions, stable extensive opacity throughout the left hemithorax. Currently he is on trach collar, requiring 7 L of oxygen per minute.. Per PCCm, trach collar may be destination for pt. duonebs +CPT Continue abx  Paroxysmal A. fib: Currently rate is controlled.  Anticoagulation was discontinued due to suspected GI bleed.  C urrently on low-dose Eliquis.  On rate control with Cardizem.  Sepsis/bacteremia: Enterococcus and Staphylococcus epidermidis bacteremia.  Also has osteomyelitis fifth sacral segment.    High suspicion for ongoing aspiration.    Infectious disease consulted and was following.   MRI pelvis/sacrum showed osteomyelitis of fifth sacral segment, edema in the muscle around both hips and proximal thighs with possible myositis.   Currently on vancomycin,fortaz, Flagyl.   IR consulted for bone biopsy but due to high risk  it was not done. ID recommended treatment for 6 weeks for sacral osteomyelitis and staph epidermidis bacteremia with vancomycin, fortaz and metronidazole. He has persistent  leukocytosis.  Continue to monitor CBC.   ESRD: On dialysis per nephrology.  Underwent  tunnel catheter placement on 6/2.   HD in am   Chronic normocytic anemia: Secondary to ESRD, critical illness.  Has been transfused with PRBCs.  Currently hemoglobin stable.  Continue to monitor  Unstageable sacral pressure ulcer/osteomyelitis: Wound care following.  Discussion as above.  Cachexia/severe protein malnutrition: On tube feeds.  Nutrition following.  Mild elevated LFTs: Continue to monitor.  Disposition: Patient is from [place].  Anticipate discharge to LTAC/SNF but due to lack of insurance, placement has been difficult.  Social working Sports administrator.  Goals of care: DNR.  He has poor prognosis due to post Covid fibrosis, deconditioning, multiple episodes of  healthcare associated  pneumonia.  We have requested for palliative  care evaluation.plz see note-trying to have discussion with pt . Per palliative-family had a meeting and that they wish to continue all aggressive care, including vent and dialysis  Pressure Injury 02/14/20 Sacrum Medial Stage 4 -  Full thickness tissue loss with exposed bone, tendon or muscle. (Active)  02/14/20 1906  Location: Sacrum  Location Orientation: Medial  Staging: Stage 4 - Full thickness tissue loss with exposed bone, tendon or muscle.  Wound Description (Comments):   Present on Admission: Yes     Pressure Injury 03/03/20 Neck Anterior Stage 3 -  Full thickness tissue loss. Subcutaneous fat may be visible but bone, tendon or muscle are NOT exposed. Device related injury under trach tube.  (Active)  03/03/20 1805  Location: Neck  Location Orientation: Anterior  Staging: Stage 3 -  Full thickness tissue loss. Subcutaneous fat may be visible but bone, tendon or muscle are NOT exposed.  Wound Description (Comments): Device related injury under trach tube.   Present on Admission: No   Nutrition Problem: Severe Malnutrition Etiology: chronic illness(chronic respiratory failure related to COVID ARDS)  DVT prophylaxis: Eliquis Code Status: DNR Family Communication: None at bedside Disposition Plan:  Status is: Inpatient  Remains inpatient appropriate because:Unsafe d/c plan   Dispo: The patient is from: [facility]  Anticipated d/c is to: SNF/LTAC  Anticipated d/c date is: Unknown     LOS: 23 days   Time spent:45 min with >50% on coc    Nolberto Hanlon, MD [facility] Pager [phone]  If 7PM-7AM, please contact night-coverage [email or web address] Password [email or web address] 03/08/2020, 8:19 AM Patient ID: Deonta Bomberger, male   DOB: 09/04/1946, 74 y.o.   MRN: [id number]

## 2020-03-08 NOTE — Progress Notes (Signed)
[facility] Progress Note  Subjective: has remained on TC. S/p HD yesterday UF tolerated 0.5L.  Less awake this AM - per RN had just rec'd fentanyl for pain.  Vitals:   03/08/20 0500 03/08/20 0600 03/08/20 0700 03/08/20 0748  BP: 135/62 122/74 (!) 113/57   Pulse: (!) 109 (!) 45  (!) 113  Resp: (!) 39 (!) 39 (!) 35 (!) 38  Temp:   99 F (37.2 C)   TempSrc:   Axillary   SpO2: 96% 100%  98%  Weight:      Height:        Exam:   In ICU on TC, speaking valve in place but not talking today  no jvd  Chest cta bilat ant/ lat  Cor reg no RG  Abd soft ntnd no ascites, +llq ostomy, + peg tube   Ext  No edema   Neuro: as above     Summary: came from [facility] to [facility] after COVID hospitalization which included mech ventilation and trach placement. At [facility] had severe infections/ bacteremia/ PNA and developed AKI felt due to IV gent toxicity requiring initiation of HD 12/16/19. Admitted to ICU [facility] for trach bleed on 5/19. Was getting MWF HD at [facility] prior to admit here.   OP HD: started March 2021 at [facility] as above MWF   Getting HD here 3.5- 4h, UF 0.5- 1.5 L, hep 2000 prn   admit 62.8kg > range 56.9- 64.7kg here  Assessment/ Plan: 1. 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 On IV abx. Get volume down as tol w/ HD. Doing fairly well on TC now.  2. Enterococcus/ MRSE bacteremia HD cath sepsis - TDC removed 5/26, per ID getting 6 wks vanc/ fortaz/ flagyl for polymicrobial sepsis and osteomyelitis.  3. Sacral osteo/ decub stage IV - per ID, also abx as above 4. ESRD/ AKI - on HD since mid March 2021, d/t gent toxicity. HD MWF at [facility]. S/P new TDC 6/2 by IR.  HD Thurs, plan next Sat.   5. Trach bleed - resolved 6. Tracheobronchitis - sp course of doxycycline 7. AMS - variable, seems overall improved 8. Atrial fib - per primary, on eliquis 2.5 BID 9. Anemia ckd - transfuse prn, Hb 9.5. 10. Hypercalcemia:  w/ low PTH, prob from  immobility, corrCa 11.0 > 10.5>10.3, improved. Cont low 2.0 Ca++ bath , PTH 29 11. BP/vol - no sig vol excess now, wt way down c/w admission. 12. SP peg/ divert colostomy 13. SP COVID pna - original problem in Feb at OSH 14. DNR, poor prognosis, LTACH only option for disposition given needs but apparently not a candidate.  Appreciate palliative care getting involved, their note was reviewed.  Hopefully patient can participate in the discussion to take the burden off family to make more palliative decisions --> he has a speaking valve in place now.     Shane Hick MD [facility] Pager [phone]   Recent Labs  Lab 03/04/20 1255 03/05/20 0253 03/06/20 0249 03/06/20 0249 03/07/20 0307 03/08/20 0530  K 3.1*   < > 3.8  --  3.8  --   BUN 117*   < > 98*  --  147*  --   CREATININE 3.37*   < > 2.88*  --  3.90*  --   CALCIUM 9.8   < > 9.9  --  10.4*  --   PHOS 3.2  --   --   --   --   --  HGB  --    < > 9.2*   < > 9.2* 9.5*   < > = values in this interval not displayed.   Inpatient medications: . apixaban  2.5 mg Per Tube BID  . chlorhexidine gluconate (MEDLINE KIT)  15 mL Mouth Rinse BID  . Chlorhexidine Gluconate Cloth  6 each Topical Q0600  . feeding supplement (PRO-STAT SUGAR FREE 64)  30 mL Per Tube BID  . insulin aspart  0-6 Units Subcutaneous Q4H  . insulin aspart  2 Units Subcutaneous Q4H  . ipratropium-albuterol  3 mL Nebulization QID  . levothyroxine  25 mcg Per Tube Q0600  . mouth rinse  15 mL Mouth Rinse 10 times per day  . metroNIDAZOLE  500 mg Per Tube Q8H  . nutrition supplement (JUVEN)  1 packet Per Tube BID BM  . pantoprazole sodium  40 mg Per Tube Daily  . vancomycin  500 mg Intravenous Q T,Th,Sa-HD   . sodium chloride    . sodium chloride    . sodium chloride Stopped (03/04/20 1524)  . cefTAZidime (FORTAZ)  IV 2 g (03/07/20 1751)  . feeding supplement (NEPRO CARB STEADY) 1,000 mL (03/08/20 0440)   sodium chloride, sodium chloride, sodium  chloride, acetaminophen (TYLENOL) oral liquid 160 mg/5 mL, alteplase, docusate, fentaNYL (SUBLIMAZE) injection, heparin, heparin, heparin, heparin, lidocaine (PF), lidocaine-prilocaine, midazolam, midazolam, pentafluoroprop-tetrafluoroeth, polyethylene glycol

## 2020-03-08 NOTE — Progress Notes (Addendum)
2:20pm: CSW spoke with Claiborne Billings at [facility] who states the facility will review this patient - CSW sent clinicals for review.  1:50pm: CSW sent clinicals for review to [facility].   9am: CSW spoke with Trey Sailors, hospital liaison for [place] reports no facility will be able to accomodates the patient's dialysis needs due to him being unable to sit up.  CSW spoke with Juliann Pulse at [facility] who states the facility is not in network with the patient's insurance.  CSW spoke with Gae Bon in admissions at [facility] who states the facility does not provide any HD services. Gae Bon provided CSW with facilities in [place] who accept patient's requiring trach and HD.  CSW attempted to reach [facility] in admissions at [facility] to make a referral for this patient - no answer so a voicemail was left requesting a return call.   CSW spoke with admissions at [facility] to request a review of this patient - CSW faxed seven days worth of clinical documentation over for review to [phone].  Madilyn Fireman, MSW, LCSW-A Transitions of Care  Clinical Social Worker  [facility] Emergency Departments  Medical ICU [phone]

## 2020-03-08 NOTE — Progress Notes (Signed)
NAME:  Shane Banks, MRN:  [id number], DOB:  1946-06-08, LOS: 2 ADMISSION DATE:  02/14/2020, CONSULTATION DATE:  5/19 REFERRING MD:  Dr. Alvino Chapel, CHIEF COMPLAINT:  Hemoptysis   Brief History   74 yo m with PMHx of afib with RVR, BPH, stroke, and COVID ARDS in January requiring trach and LTACH placement at [facility]. Had been tolerating trach collar, but on 5/19 developed hemoptysis and hypoxemic respiratory failure requiring vent. Transferred to [facility] on 5/19.  Past Medical History   has a past medical history of Acute on chronic respiratory failure with hypoxia ([place]), Atrial fibrillation with RVR ([place]), BPH (benign prostatic hyperplasia), COVID-19 virus infection, Pneumonia due to COVID-19 virus, Severe sepsis ([place]), and Stroke ([place]).  Significant Hospital Events   1/8  Admit to [facility] for COVID PNA 2/23 Transfer to [facility] on vent 5/19 Transfer to [facility] for hemoptysis. #6 cuffed trach placed. Back on vent. Bronch performed 5/20 Transitioned to trach collar.  5/22 back on vent full time 5/24 back on antibiotics for fevers 6/1 Initiating PSV weans 6/3 Large cuff leak, trach exchange 6/7 transitioned to [facility] 6/9 remains on TC continuously since 6/7  Consults:  ENT Nephrology ID PCCM  Procedures:  ENT flex scope 5/19 > no bleeding source identified FOB 5/19 > no pulmonary bleeding source identified.   Significant Diagnostic Tests:  CXR 5/28 >> R CVC placement confirmed Slightly improved aeration at the right base. 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 Edema in the muscles around the hips and in the posterior paraspinal musculature of the lower lumbar spine and in the buttocks which could represent myositis. Fluid-fluid level in the bladder may represent protein or  debris in the bladder. The possibility of urinary tract infection should be considered.  CXR 6/3> improved R lung aeration, worsening L sided opacity. Tracheostomy tube, HD catheter.   CXR 6/4>wosened R lung infiltrates and stable L sided infiltrate. Possible bilateral pleural effusions  Micro Data:    tracheal aspirate 5/7> for Pseudomonas A  BAL 5/19 > no growth 5/19 blood > NG 5/22 trach aspirate>> candida parasipolis 5/22 blood>> 1/4 GPC> staph epi 5/25 blood>> GPC (enterococcus on biofire) 2/4 bottles>> E. Faecalis, staph epi. 5/25 trach aspirate>>many PMN, few GPR, rare GPC> diphtheroids 5/25 blood cx>> + for Enterococcus Faecalis (gent resistant)/ Staphylococcus Epidermidis (tetracycline, vanc, rifampin sensitive) 5/27 BCx> ngtd    Antimicrobials:  Levofloxacin 5/24>5/26 vanc 5/25> Ceftriaxone  5/27>6/1 Metronidazole 5/27>  [name] 6/1>  Interim history/subjective:  No events, remains on TC, tachypneic but stable. Pulse wrong below, he is 110-120. Denies pain or SOB.  Objective   Blood pressure 122/74, pulse (!) 45, temperature 98.2 F (36.8 C), temperature source Axillary, resp. rate (!) 39, height _0  (1.753 m), weight 56.4 kg, SpO2 100 %.    FiO2 (%):  [28 %] 28 %   Intake/Output Summary (Last 24 hours) at 03/08/2020 0719 Last data filed at 03/08/2020 0600 Gross per 24 hour  Intake 1050 ml  Output 650 ml  Net 400 ml   Filed Weights   03/07/20 0930 03/07/20 1245 03/08/20 0437  Weight: 59 kg 58.5 kg 56.4 kg    Examination:   GEN: chronically ill man on vent HEENT: trach in place, minimal secretions CV: RRR, ext warm PULM: tachypneic, coarse rhonci bilaterally, + accessory muscle  use GI: Soft, +BS, PEG and ostomy in place EXT: +muscle wasting, no edema NEURO: moves all 4 ext but weak PSYCH: RASS 0 SKIN: No rashes  No new labs  Resolved Hospital Problem list     Assessment & Plan:   Acute on chronic hypoxemic respiratory failure requiring  tracheostomy, 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  - Continue trach collar - Continue working with PMV - Secretions and overall frailty currently would prevent decannulation.  - Duonebs + CPT - Abx per primary team - Volume removal by HD as tolerated.  - Awaiting PCU bed - Disposition per CM/primary discussion  Other issues per primary Enterococcus/ MRSE bacteremia HD cath sepsis  Sacral osteo/ decub stage IV  Afib S/p PEG, diverting ostomy  Erskine Emery MD [facility] 03/08/2020 7:20 AM Personal pager: [phone] If unanswered, please page CCM On-call: [phone]

## 2020-03-08 NOTE — Progress Notes (Signed)
Physical Therapy Treatment Patient Details Name: Shane Banks MRN: [id number] DOB: Jun 16, 1946 Today's Date: 03/08/2020    History of Present Illness 74 yo m with PMHx of afib with RVR, BPH, stroke, and COVID ARDS in January requiring trach and LTACH placement at [facility]. Had been tolerating trach collar, but on 5/19 developed hemoptysis and hypoxemic respiratory failure requiring vent. Transferred to [facility] on 5/19.  5/19 back on vent, 6/7 transitioned to [facility]    PT Comments    Pt awake and drowsy on arrival.  Plan was to get to chair today, but other emphasis on ROM with prolonged stretch on mildly and more extensively contractured joints, sitting balance at EOB, before transfer via face to face stand pivot.    Follow Up Recommendations  SNF;LTACH;Supervision/Assistance - 24 hour     Equipment Recommendations  Other (comment) (TBA)    Recommendations for Other Services OT consult     Precautions / Restrictions Precautions Precautions: Fall Precaution Comments: TC continuously on 28% FiO2    Mobility  Bed Mobility Overal bed mobility: Needs Assistance Bed Mobility: Supine to Sit     Supine to sit: Total assist     General bed mobility comments: pt unable to assist with any of the mobility  Transfers Overall transfer level: Needs assistance   Transfers: Stand Pivot Transfers   Stand pivot transfers: Total assist;+2 safety/equipment       General transfer comment: Due to pt's stiffness in extention, a stand pivot is relatively easy, but pt unable to actively assist.  Ambulation/Gait                 Stairs             Wheelchair Mobility    Modified Rankin (Stroke Patients Only)       Balance Overall balance assessment: Needs assistance Sitting-balance support: No upper extremity supported;Single extremity supported;Feet supported Sitting balance-Leahy Scale: Zero Sitting balance - Comments: As on eval, noticed minimal abdominal  activation, trace paraspinals.  Sat for ~6 min                                    Cognition Arousal/Alertness: Awake/alert Behavior During Therapy: Flat affect;WFL for tasks assessed/performed Overall Cognitive Status: Difficult to assess                                 General Comments: inconsistently following commands; nsg states he has been following commands for her more consistently; may be affected by fatigue as pt had dialysis this am      Exercises Other Exercises Other Exercises: PROM to all 4's with emphasis on bil knees,  elbows, heelcords and shd's.    General Comments General comments (skin integrity, edema, etc.): HR up to 128 bpm, sats mid 90's on TC      Pertinent Vitals/Pain Pain Assessment: Faces Faces Pain Scale: Hurts even more Pain Location: with ROM at ankles/knees elbows and shoulders Pain Descriptors / Indicators: Tightness;Sore;Grimacing;Guarding;Discomfort Pain Intervention(s): Limited activity within patient's tolerance    Home Living Family/patient expects to be discharged to:: Other (Comment) (SNF or LTAC likely)                    Prior Function Level of Independence: Needs assistance  Gait / Transfers Assistance Needed: Since stroke has been limited, but functional  until  he contracted Covid.       PT Goals (current goals can now be found in the care plan section) Acute Rehab PT Goals Patient Stated Goal: pt unable to participate PT Goal Formulation: With patient Time For Goal Achievement: 03/20/20 Potential to Achieve Goals: Fair    Frequency    Min 2X/week      PT Plan      Co-evaluation              AM-PAC PT "6 Clicks" Mobility   Outcome Measure  Help needed turning from your back to your side while in a flat bed without using bedrails?: Total Help needed moving from lying on your back to sitting on the side of a flat bed without using bedrails?: Total Help needed moving to and  from a bed to a chair (including a wheelchair)?: Total Help needed standing up from a chair using your arms (e.g., wheelchair or bedside chair)?: Total Help needed to walk in hospital room?: Total Help needed climbing 3-5 steps with a railing? : Total 6 Click Score: 6    End of Session Equipment Utilized During Treatment: Oxygen Activity Tolerance: Patient tolerated treatment well Patient left: in bed;with call bell/phone within reach;with nursing/sitter in room Nurse Communication: Mobility status PT Visit Diagnosis: Muscle weakness (generalized) (M62.81);Adult, failure to thrive (R62.7);Other abnormalities of gait and mobility (R26.89)     Time: 9150-5697 PT Time Calculation (min) (ACUTE ONLY): 20 min  Charges:  $Therapeutic Activity: 8-22 mins                     03/08/2020  [name], PT Acute Rehabilitation Services [phone]  (pager) [phone]  (office)   Tessie Fass Needham Biggins 03/08/2020, 2:50 PM

## 2020-03-08 NOTE — Evaluation (Signed)
Clinical/Bedside Swallow Evaluation Patient Details  Name: Shane Banks MRN: [id number] Date of Birth: [birth year]  Today's Date: 03/08/2020 Time: SLP Start Time (ACUTE ONLY): 0900 SLP Stop Time (ACUTE ONLY): 0920 SLP Time Calculation (min) (ACUTE ONLY): 20 min  Past Medical History:  Past Medical History:  Diagnosis Date  . Acute on chronic respiratory failure with hypoxia ([place])   . Atrial fibrillation with RVR ([place])   . BPH (benign prostatic hyperplasia)   . COVID-19 virus infection   . Pneumonia due to COVID-19 virus   . Severe sepsis ([place])   . Stroke [facility])    Past Surgical History:  Past Surgical History:  Procedure Laterality Date  . IR FLUORO GUIDE CV LINE RIGHT  12/15/2019  . IR FLUORO GUIDE CV LINE RIGHT  02/28/2020  . IR THORACENTESIS ASP PLEURAL SPACE W/IMG GUIDE  12/21/2019  . IR US GUIDE VASC ACCESS RIGHT  12/15/2019  . IR US GUIDE VASC ACCESS RIGHT  02/28/2020   HPI:  74 yo m with PMHx of afib with RVR, BPH, stroke, and COVID ARDS in January requiring trach and LTACH placement at [facility]. Had been tolerating trach collar, but on 5/19 developed hemoptysis and hypoxemic respiratory failure requiring vent. Transferred to [facility] on 5/19. PMSV desired for discussion with pt regarding goals of care.   Assessment / Plan / Recommendation Clinical Impression  In the setting of prolonged critical illness with 5-6 months NPO status and baseline high RR, pt does demonstrate dysphagia, but with potential for PO intake with modified textures and careful feeding methods. Pt requires verbal cueing and modifications for assisted feeding to accept bolus. His head needs anterior support and cup and spoon feeding result in very poor bolus containment. Pt was more successful with a siphoned straw and then sips from a straw. A sip of thin water resulted in multiple swallows and an immediate cough. Nectar was subjectively tolerated with only slight throat clearing. Recommend FEES for  instrumental assessment of swallowing. Will set up for next week.  SLP Visit Diagnosis: Aphonia (R49.1)    Aspiration Risk  Severe aspiration risk    Diet Recommendation NPO;Alternative means - long-term        Other  Recommendations Oral Care Recommendations: Oral care QID   Follow up Recommendations LTACH      Frequency and Duration            Prognosis Prognosis for Safe Diet Advancement: Guarded Barriers to Reach Goals: Severity of deficits;Time post onset      Swallow Study   General HPI: 74 yo m with PMHx of afib with RVR, BPH, stroke, and COVID ARDS in January requiring trach and LTACH placement at [facility]. Had been tolerating trach collar, but on 5/19 developed hemoptysis and hypoxemic respiratory failure requiring vent. Transferred to [facility] on 5/19. PMSV desired for discussion with pt regarding goals of care. Type of Study: Bedside Swallow Evaluation Diet Prior to this Study: NPO Temperature Spikes Noted: No Respiratory Status: Trach Collar History of Recent Intubation: No Behavior/Cognition: Alert;Cooperative;Requires cueing Oral Cavity Assessment: Within Functional Limits Oral Care Completed by SLP: No Oral Cavity - Dentition: Edentulous Self-Feeding Abilities: Total assist Patient Positioning: Upright in bed Baseline Vocal Quality: Low vocal intensity Volitional Cough: Strong Volitional Swallow: Able to elicit    Oral/Motor/Sensory Function Overall Oral Motor/Sensory Function: Within functional limits   Ice Chips Ice chips: Impaired Presentation: Spoon Pharyngeal Phase Impairments: Multiple swallows;Throat Clearing - Immediate   Thin Liquid Thin Liquid: Impaired Presentation:  Cup;Spoon;Straw Oral Phase Impairments: Poor awareness of bolus;Reduced labial seal Pharyngeal  Phase Impairments: Multiple swallows;Cough - Immediate;Throat Clearing - Immediate    Nectar Thick Nectar Thick Liquid: Impaired Presentation: Straw Oral Phase  Impairments: Poor awareness of bolus Pharyngeal Phase Impairments: Multiple swallows;Throat Clearing - Immediate   Honey Thick Honey Thick Liquid: Not tested   Puree Puree: Not tested   Solid           [name], MA CCC-SLP  Acute Rehabilitation Services Pager [phone] Office [phone]  [name] 03/08/2020,1:04 PM

## 2020-03-09 LAB — BASIC METABOLIC PANEL
Anion gap: 13 (ref 5–15)
BUN: 150 mg/dL — ABNORMAL HIGH (ref 8–23)
CO2: 26 mmol/L (ref 22–32)
Calcium: 10.3 mg/dL (ref 8.9–10.3)
Chloride: 99 mmol/L (ref 98–111)
Creatinine, Ser: 3.89 mg/dL — ABNORMAL HIGH (ref 0.61–1.24)
GFR calc Af Amer: 17 mL/min — ABNORMAL LOW (ref >60)
GFR calc non Af Amer: 14 mL/min — ABNORMAL LOW (ref >60)
Glucose, Bld: 154 mg/dL — ABNORMAL HIGH (ref 70–99)
Potassium: 3.9 mmol/L (ref 3.5–5.1)
Sodium: 138 mmol/L (ref 135–145)

## 2020-03-09 LAB — CBC WITH DIFFERENTIAL/PLATELET
Abs Immature Granulocytes: 0.31 10*3/uL — ABNORMAL HIGH (ref 0.00–0.07)
Basophils Absolute: 0.1 10*3/uL (ref 0.0–0.1)
Basophils Relative: 0 %
Eosinophils Absolute: 0.6 10*3/uL — ABNORMAL HIGH (ref 0.0–0.5)
Eosinophils Relative: 3 %
HCT: 30.8 % — ABNORMAL LOW (ref 39.0–52.0)
Hemoglobin: 9.6 g/dL — ABNORMAL LOW (ref 13.0–17.0)
Immature Granulocytes: 1 %
Lymphocytes Relative: 12 %
Lymphs Abs: 2.8 10*3/uL (ref 0.7–4.0)
MCH: 25.8 pg — ABNORMAL LOW (ref 26.0–34.0)
MCHC: 31.2 g/dL (ref 30.0–36.0)
MCV: 82.8 fL (ref 80.0–100.0)
Monocytes Absolute: 2 10*3/uL — ABNORMAL HIGH (ref 0.1–1.0)
Monocytes Relative: 9 %
Neutro Abs: 16.5 10*3/uL — ABNORMAL HIGH (ref 1.7–7.7)
Neutrophils Relative %: 75 %
Platelets: 311 10*3/uL (ref 150–400)
RBC: 3.72 MIL/uL — ABNORMAL LOW (ref 4.22–5.81)
RDW: 19.2 % — ABNORMAL HIGH (ref 11.5–15.5)
WBC: 22.2 10*3/uL — ABNORMAL HIGH (ref 4.0–10.5)
nRBC: 0 % (ref 0.0–0.2)

## 2020-03-09 LAB — GLUCOSE, CAPILLARY
Glucose-Capillary: 143 mg/dL — ABNORMAL HIGH (ref 70–99)
Glucose-Capillary: 146 mg/dL — ABNORMAL HIGH (ref 70–99)
Glucose-Capillary: 140 mg/dL — ABNORMAL HIGH (ref 70–99)
Glucose-Capillary: 123 mg/dL — ABNORMAL HIGH (ref 70–99)
Glucose-Capillary: 196 mg/dL — ABNORMAL HIGH (ref 70–99)

## 2020-03-09 MED ORDER — INSULIN ASPART 100 UNIT/ML IV SOLN: 10.0000 [IU] | Freq: Once | INTRAVENOUS | Status: DC

## 2020-03-09 MED ORDER — METOPROLOL SUCCINATE 12.5 MG HALF TABLET: 12.5000 mg | ORAL_TABLET | Freq: Every day | ORAL | Status: DC

## 2020-03-09 MED ORDER — CALCIUM GLUCONATE-NACL 1-0.675 GM/50ML-% IV SOLN: 1.0000 g | Freq: Once | INTRAVENOUS | Status: DC

## 2020-03-09 MED ORDER — HEPARIN SODIUM (PORCINE) 1000 UNIT/ML IJ SOLN
INTRAMUSCULAR | Status: AC
  Administered 2020-03-09: 3800 [IU]
  Filled 2020-03-09: qty 6

## 2020-03-09 MED ORDER — DEXTROSE 50 % IV SOLN: 1.0000 | Freq: Once | INTRAVENOUS | Status: DC

## 2020-03-09 MED ORDER — METOPROLOL TARTRATE 12.5 MG HALF TABLET
12.5000 mg | ORAL_TABLET | Freq: Two times a day (BID) | ORAL | Status: DC
  Administered 2020-03-09 – 2020-03-18 (×17): 12.5 mg
  Filled 2020-03-09 (×18): qty 1

## 2020-03-09 NOTE — Progress Notes (Signed)
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  Patient had recent history of Covid pneumonia and was on ventilator because he progressed to ARDS.  Because of little improvement in the ventilatory and oxygenation, he underwent tracheostomy and was discharged to [facility].  He had recent history of UTI, multidrug-resistant Pseudomonas pneumonia, stenotrophomonas bacteremia.  Because of sepsis and shock, he developed renal failure requiring dialysis.  On 5/19, he developed hemoptysis and was transferred to [facility] ED for evaluation.  PCCM,ID,Nephrology following here.  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  He also underwent tracheostomy tube change by PCCM for persistent leak.  Due to lack of insurance, he cannot go back to [place].  Social worker assisting on placement.  Anticipate prolonged hospitalization.    Consultants:   palliative ENT Nephrology ID PCCM  Procedures: ENT flex scope 5/19 > no bleeding source identified FOB 5/19 >no pulmonary bleeding source identified. 6/2: Tunnel catheter placement by IR. 6/4: Changed trach tube  Antimicrobials:   Metronidazole, vancomycin, Fortaz   Subjective: No issues per nsg. Pt is sr but at times goes into afib .   Objective: Vitals:   03/09/20 0327 03/09/20 0400 03/09/20 0442 03/09/20 0714  BP:  123/72    Pulse:  82 (!) 55   Resp:  (!) 28 (!) 38   Temp: (!) 97.3 F (36.3 C)   98 F (36.7 C)  TempSrc: Axillary   Axillary  SpO2:  100% 100%   Weight:      Height:        Intake/Output Summary (Last 24 hours) at 03/09/2020 0747 Last data filed at 03/09/2020 0400 Gross per  24 hour  Intake 1480 ml  Output 750 ml  Net 730 ml   Filed Weights   03/07/20 0930 03/07/20 1245 03/08/20 0437  Weight: 59 kg 58.5 kg 56.4 kg    Examination:  General exam: Appears calm and comfortable , on trach. ,  More awake today Respiratory system: Clear to auscultation anteriorly, no r/w/r Cardiovascular system: S1 & S2 heard, RRR. No JVD, murmurs, rubs, gallops or clicks.  Gastrointestinal system: Abdomen is nondistended, soft and nontender. Normal bowel sounds heard.+iloostomy ,+peg in place Central nervous system: Grossly appears intact Extremities: no edema or cyanosis Skin: warm, dry Psych: Mood appropriate for current setting    Data Reviewed: I have personally reviewed following labs and imaging studies  CBC: Recent Labs  Lab 03/05/20 0253 03/06/20 0249 03/07/20 0307 03/08/20 0530 03/09/20 0334  WBC 22.8* 23.7* 25.6* 25.0* 22.2*  NEUTROABS 16.6* 17.0* 19.1* 18.8* 16.5*  HGB 8.6* 9.2* 9.2* 9.5* 9.6*  HCT 28.0* 29.8* 29.1* 31.1* 30.8*  MCV 83.6 83.9 82.7 83.8 82.8  PLT 312 347 360 342 323   Basic Metabolic Panel: Recent Labs  Lab 03/03/20 0257 03/03/20 0257 03/03/20 1032 03/04/20 1255 03/05/20 0253 03/06/20 0249 03/07/20 0307  NA 135   < > 138 136 135 137 140  K 3.9   < > 3.8 3.1* 3.4* 3.8 3.8  CL 97*  --   --  97* 98 99 102  CO2 26  --   --  _0 GLUCOSE 144*  --   --  142* 139* 137* 132*  BUN 53*  --   --  117* 51* 98* 147*  CREATININE 1.93*  --   --  3.37* 1.83* 2.88* 3.90*  CALCIUM 9.2  --   --  9.8 9.2 9.9 10.4*  PHOS  --   --   --  3.2  --   --   --    < > = values in this interval not displayed.   GFR: Estimated Creatinine Clearance: 13.7 mL/min (A) (by C-G formula based on SCr of 3.9 mg/dL (H)). Liver Function Tests: Recent Labs  Lab 03/04/20 1255  ALBUMIN 2.1*   No results for input(s): LIPASE, AMYLASE in the last 168 hours. No results for input(s): AMMONIA in the last 168 hours. Coagulation Profile: No results for  input(s): INR, PROTIME in the last 168 hours. Cardiac Enzymes: No results for input(s): CKTOTAL, CKMB, CKMBINDEX, TROPONINI in the last 168 hours. BNP (last 3 results) No results for input(s): PROBNP in the last 8760 hours. HbA1C: No results for input(s): HGBA1C in the last 72 hours. CBG: Recent Labs  Lab 03/08/20 1527 03/08/20 1915 03/08/20 2314 03/09/20 0327 03/09/20 0713  GLUCAP 145* 148* 154* 143* 146*   Lipid Profile: No results for input(s): CHOL, HDL, LDLCALC, TRIG, CHOLHDL, LDLDIRECT in the last 72 hours. Thyroid Function Tests: No results for input(s): TSH, T4TOTAL, FREET4, T3FREE, THYROIDAB in the last 72 hours. Anemia Panel: No results for input(s): VITAMINB12, FOLATE, FERRITIN, TIBC, IRON, RETICCTPCT in the last 72 hours. Sepsis Labs: No results for input(s): PROCALCITON, LATICACIDVEN in the last 168 hours.  Recent Results (from the past 240 hour(s))  Culture, blood (routine x 2)     Status: None   Collection Time: 03/03/20  9:13 AM   Specimen: BLOOD RIGHT HAND  Result Value Ref Range Status   Specimen Description BLOOD RIGHT HAND  Final   Special Requests   Final    BOTTLES DRAWN AEROBIC ONLY Blood Culture adequate volume   Culture   Final    NO GROWTH 5 DAYS Performed at [facility], [address] [place], [place] [address]    Report Status 03/08/2020 FINAL  Final  Culture, blood (routine x 2)     Status: None   Collection Time: 03/03/20  9:13 AM   Specimen: BLOOD LEFT HAND  Result Value Ref Range Status   Specimen Description BLOOD LEFT HAND  Final   Special Requests   Final    BOTTLES DRAWN AEROBIC ONLY Blood Culture adequate volume   Culture   Final    NO GROWTH 5 DAYS Performed at [facility], [place] [address] [place], [place] [address]    Report Status 03/08/2020 FINAL  Final         Radiology Studies: No results found.      Scheduled Meds: . apixaban  2.5 mg Per Tube BID  . chlorhexidine gluconate (MEDLINE KIT)  15  mL Mouth Rinse BID  . Chlorhexidine Gluconate Cloth  6 each Topical Q0600  . feeding supplement (PRO-STAT SUGAR FREE 64)  30 mL Per Tube BID  . insulin aspart  0-6 Units Subcutaneous Q4H  . insulin aspart  2 Units Subcutaneous Q4H  . ipratropium-albuterol  3 mL Nebulization QID  . levothyroxine  25 mcg Per Tube Q0600  . mouth rinse  15 mL Mouth Rinse 10 times per day  . metroNIDAZOLE  500 mg Per Tube Q8H  . nutrition supplement (JUVEN)  1 packet Per Tube BID  BM  . pantoprazole sodium  40 mg Per Tube Daily  . vancomycin  500 mg Intravenous Q T,Th,Sa-HD   Continuous Infusions: . sodium chloride    . sodium chloride    . sodium chloride Stopped (03/04/20 1524)  . cefTAZidime (FORTAZ)  IV Stopped (03/07/20 1822)  . 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  Status post trach due to respiratory failure from Covid pneumonia.  He is on trach collar. PCCM following.  Most recently pneumonia has been multidrug-resistant Pseudomonas for which she was treated with Avycaz and mycin nebulizers at [facility]. Possible ongoing aspiration also.   Presented with hemoptysis, aspiration of blood.  Patient has cuffed trach placed by ENT.  No active bleeding on bronchoscopy.   Continue attempts at weaning vent goal is to progress to trach collar.  He also underwent tracheostomy tube change by PCCM for persistent leak on 03/01/20. Chest x-ray done on 03/03/2020 showed bilateral  pleural effusions, stable extensive opacity throughout the left hemithorax. Currently he is on trach collar, requiring 7 L of oxygen per minute.. Per PCCm, trach collar may be destination for pt. duonebs +CPT Continue abx  Paroxysmal A. fib: Currently rate is controlled.  Anticoagulation was discontinued due to suspected GI bleed.   currently on low-dose Eliquis.   Not on cardizem? Will add low dose toprol xl 12.5mg qd for better rate control.  Sepsis/bacteremia: Enterococcus and Staphylococcus epidermidis bacteremia.  Also has osteomyelitis fifth sacral segment.    High suspicion for ongoing aspiration.    Infectious disease consulted and was following.   MRI pelvis/sacrum showed osteomyelitis of fifth sacral segment, edema in the muscle around both hips and proximal thighs with possible myositis.   Currently on vancomycin,fortaz, Flagyl.   IR consulted for bone biopsy but due to high risk  it was not done. ID recommended treatment for 6 weeks for sacral osteomyelitis and staph epidermidis bacteremia with vancomycin, fortaz and metronidazole. He has persistent  leukocytosis.  Continue to monitor CBC.   ESRD: On dialysis per nephrology.  Underwent  tunnel catheter placement on 6/2.   HD in am   Chronic normocytic anemia: Secondary to ESRD, critical illness.  Has been transfused with PRBCs.  Currently hemoglobin stable.  Continue to monitor  Unstageable sacral pressure ulcer/osteomyelitis: Wound care following.  Discussion as above.  Cachexia/severe protein malnutrition: On tube feeds.  Nutrition following.  Mild elevated LFTs: Continue to monitor.  Disposition: Patient is from [facility].  Anticipate discharge to LTAC/SNF but due to lack of insurance, placement has been difficult.  Social working assisting.  Goals of care: DNR.  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  We have requested for  palliative  care evaluation.plz see note-trying to have discussion with pt . Per palliative-family had a meeting and that they wish to continue all aggressive care, including vent and dialysis  Pressure Injury 02/14/20 Sacrum Medial Stage 4 - Full thickness   tissue loss with exposed bone, tendon or muscle. (Active)  02/14/20 1906  Location: Sacrum  Location Orientation: Medial  Staging: Stage 4 - Full thickness tissue loss with exposed bone, tendon or muscle.  Wound Description (Comments):   Present on Admission: Yes     Pressure Injury 03/03/20 Neck Anterior Stage 3 -  Full thickness tissue loss. Subcutaneous fat may be visible but bone, tendon or muscle are NOT exposed. Device related injury under trach tube.  (Active)  03/03/20 1805  Location: Neck  Location Orientation: Anterior  Staging: Stage 3 -  Full thickness tissue loss. Subcutaneous fat may be visible but bone, tendon or muscle are NOT exposed.  Wound Description (Comments): Device related injury under trach tube.   Present on Admission: No   Nutrition Problem: Severe Malnutrition Etiology: chronic illness(chronic respiratory failure related to COVID ARDS)  DVT prophylaxis: Eliquis Code Status: DNR Family Communication: None at bedside Disposition Plan:  Status is: Inpatient  Remains inpatient appropriate because:Unsafe d/c plan   Dispo: The patient is from: [facility]  Anticipated d/c is to: SNF/LTAC  Anticipated d/c date is: Unknown     LOS: 24 days   Time spent:45 min with >50% on coc    Nolberto Hanlon, MD [facility] Pager [phone]  If 7PM-7AM, please contact night-coverage [email or web address] Password [facility] 03/09/2020, 7:47 AM Patient ID: Shane Banks, male   DOB: 1946-06-28, 74 y.o.

## 2020-03-09 NOTE — Progress Notes (Signed)
[facility] Progress Note  Subjective: has remained on TC. S/p HD yesterday UF tolerated 0.5L.  Less awake this AM - per RN had just rec'd fentanyl for pain.  Vitals:   03/09/20 0714 03/09/20 0800 03/09/20 0848 03/09/20 0900  BP:  127/74 127/74 114/64  Pulse:  (!) 106 (!) 114 (!) 53  Resp:  (!) 30 (!) 30 (!) 33  Temp: 98 F (36.7 C)     TempSrc: Axillary     SpO2:  100%  100%  Weight:      Height:        Exam:   In ICU on TC, speaking valve in place but not talking today, sleepy  no jvd  Chest cta bilat ant/ lat  Cor reg no RG  Abd soft ntnd no ascites, +llq ostomy, + peg tube   Ext  No edema   Neuro: awakens but not very interactive     Summary: came from [facility] to [facility] after COVID hospitalization which included mech ventilation and trach placement. At [facility] had severe infections/ bacteremia/ PNA and developed AKI felt due to IV gent toxicity requiring initiation of HD 12/16/19. Admitted to ICU [facility] for trach bleed on 5/19. Was getting MWF HD at [facility] prior to admit here.   OP HD: started March 2021 at [facility] as above MWF   Getting HD here 3.5- 4h, UF 0.5- 1.5 L, hep 2000 prn   admit 62.8kg > range 56.9- 64.7kg here  Assessment/ Plan: 1. 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 On IV abx. Get volume down as tol w/ HD. Doing fairly well on TC now though RR stays in 30-40s.  2. Enterococcus/ MRSE bacteremia HD cath sepsis - TDC removed 5/26, per ID getting 6 wks vanc/ fortaz/ flagyl for polymicrobial sepsis and osteomyelitis.  3. Sacral osteo/ decub stage IV - per ID, also abx as above 4. ESRD/ AKI - on HD since mid March 2021, d/t gent toxicity. HD MWF at [facility]. S/P new TDC 6/2 by IR.  HD Thurs, plan next today.   5. Trach bleed - resolved 6. Tracheobronchitis - sp course of doxycycline 7. AMS - variable, seems overall improved 8. Atrial fib - per primary, on eliquis 2.5 BID 9. Anemia ckd - transfuse prn, Hb  9.6. 10. Hypercalcemia:  w/ low PTH, prob from immobility, corrCa 11.0 > 10.5>10.3, improved. Cont low 2.0 Ca++ bath , PTH 29 11. BP/vol - no sig vol excess now, wt way down c/w admission. 12. SP peg/ divert colostomy 13. SP COVID pna - original problem in Feb at OSH 14. DNR, poor prognosis, LTACH only option for disposition given needs but apparently not a candidate.  Appreciate palliative care getting involved, their note was reviewed.  Hopefully patient can participate in the discussion to take the burden off family to make more palliative decisions --> he has a speaking valve in place now and PM tried to conversate with him yesterday but it was challenging and they think he may have some delerium to complicate things.  For now cont plan of care.     MD [facility] Pager [phone]   Recent Labs  Lab 03/04/20 1255 03/05/20 0253 03/06/20 0249 03/06/20 0249 03/07/20 0307 03/07/20 0307 03/08/20 0530 03/09/20 0334  K 3.1*   < > 3.8  --  3.8  --   --   --   BUN 117*   < > 98*  --  147*  --   --   --     CREATININE 3.37*   < > 2.88*  --  3.90*  --   --   --   CALCIUM 9.8   < > 9.9  --  10.4*  --   --   --   PHOS 3.2  --   --   --   --   --   --   --   HGB  --    < > 9.2*   < > 9.2*   < > 9.5* 9.6*   < > = values in this interval not displayed.   Inpatient medications: . apixaban  2.5 mg Per Tube BID  . chlorhexidine gluconate (MEDLINE KIT)  15 mL Mouth Rinse BID  . Chlorhexidine Gluconate Cloth  6 each Topical Q0600  . feeding supplement (PRO-STAT SUGAR FREE 64)  30 mL Per Tube BID  . insulin aspart  0-6 Units Subcutaneous Q4H  . insulin aspart  2 Units Subcutaneous Q4H  . ipratropium-albuterol  3 mL Nebulization QID  . levothyroxine  25 mcg Per Tube Q0600  . mouth rinse  15 mL Mouth Rinse 10 times per day  . metroNIDAZOLE  500 mg Per Tube Q8H  . nutrition supplement (JUVEN)  1 packet Per Tube BID BM  . pantoprazole sodium  40 mg Per Tube Daily  .  vancomycin  500 mg Intravenous Q T,Th,Sa-HD   . sodium chloride    . sodium chloride    . sodium chloride Stopped (03/04/20 1524)  . cefTAZidime (FORTAZ)  IV Stopped (03/07/20 1822)  . feeding supplement (NEPRO CARB STEADY) 50 mL/hr at 03/09/20 0400   sodium chloride, sodium chloride, sodium chloride, acetaminophen (TYLENOL) oral liquid 160 mg/5 mL, alteplase, docusate, fentaNYL (SUBLIMAZE) injection, heparin, heparin, heparin, heparin, lidocaine (PF), lidocaine-prilocaine, midazolam, midazolam, pentafluoroprop-tetrafluoroeth, polyethylene glycol

## 2020-03-09 NOTE — TOC Progression Note (Signed)
Transition of Care [facility]) - Progression Note    Patient Details  Name: Jacksen Isip MRN: [id number] Date of Birth: November 02, 1945  Transition of Care [facility]) CM/SW [place], [place] Phone Number: 03/09/2020, 4:52 PM  Clinical Narrative:     No SNF bed offers at this time. CSW spoke with Claiborne Billings with [facility] who cannot accept patient. The facility is not in network with patients insurance.  TOC team will continue to follow for possible SNF placement.  Expected Discharge Plan: Long Term Acute Care (LTAC) Barriers to Discharge: Continued Medical Work up, SNF Pending bed offer  Expected Discharge Plan and Services Expected Discharge Plan: [place] (LTAC)       Living arrangements for the past 2 months: Post-Acute Facility                                       Social Determinants of Health (SDOH) Interventions    Readmission Risk Interventions No flowsheet data found.

## 2020-03-09 NOTE — Progress Notes (Signed)
NAME:  Shane Banks, MRN:  [id number], DOB:  Nov 01, 1945, LOS: 24 ADMISSION DATE:  02/14/2020, CONSULTATION DATE:  5/19 REFERRING MD:  Dr. Alvino Chapel, CHIEF COMPLAINT:  Hemoptysis   Brief History   74 yo m with PMHx of afib with RVR, BPH, stroke, and COVID ARDS in January requiring trach and LTACH placement at [facility]. Had been tolerating trach collar, but on 5/19 developed hemoptysis and hypoxemic respiratory failure requiring vent. Transferred to [facility] on 5/19.  Past Medical History   has a past medical history of Acute on chronic respiratory failure with hypoxia ([place]), Atrial fibrillation with RVR ([place]), BPH (benign prostatic hyperplasia), COVID-19 virus infection, Pneumonia due to COVID-19 virus, Severe sepsis ([place]), and Stroke ([place]).  Significant Hospital Events   1/8  Admit to [facility] for COVID PNA 2/23 Transfer to [facility] on vent 5/19 Transfer to [facility] for hemoptysis. #6 cuffed trach placed. Back on vent. Bronch performed 5/20 Transitioned to trach collar.  5/22 back on vent full time 5/24 back on antibiotics for fevers 6/1 Initiating PSV weans 6/3 Large cuff leak, trach exchange 6/7 transitioned to [facility] 6/9 remains on TC continuously since 6/7  Consults:  ENT Nephrology ID [id number]  Procedures:  ENT flex scope 5/19 > no bleeding source identified FOB 5/19 > no pulmonary bleeding source identified.   Significant Diagnostic Tests:  CXR 5/28 >> R CVC placement confirmed Slightly improved aeration at the right base. 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 Edema in the muscles around the hips and in the posterior paraspinal musculature of the lower lumbar spine and in the buttocks which could represent myositis. Fluid-fluid level in the bladder may represent protein or  debris in the bladder. The possibility of urinary tract infection should be considered.  CXR 6/3> improved R lung aeration, worsening L sided opacity. Tracheostomy tube, HD catheter.   CXR 6/4>wosened R lung infiltrates and stable L sided infiltrate. Possible bilateral pleural effusions  Micro Data:    tracheal aspirate 5/7> for Pseudomonas A  BAL 5/19 > no growth 5/19 blood > NG 5/22 trach aspirate>> candida parasipolis 5/22 blood>> 1/4 GPC> staph epi 5/25 blood>> GPC (enterococcus on biofire) 2/4 bottles>> E. Faecalis, staph epi. 5/25 trach aspirate>>many PMN, few GPR, rare GPC> diphtheroids 5/25 blood cx>> + for Enterococcus Faecalis (gent resistant)/ Staphylococcus Epidermidis (tetracycline, vanc, rifampin sensitive) 5/27 BCx> ngtd    Antimicrobials:  Levofloxacin 5/24>5/26 vanc 5/25> Ceftriaxone  5/27>6/1 Metronidazole 5/27>  [name] 6/1>  Interim history/subjective:  No events, remains on TC, tachypneic but stable. No overnight events  Objective   Blood pressure 114/64, pulse (!) 53, temperature 98.8 F (37.1 C), temperature source Axillary, resp. rate (!) 33, height _0  (1.753 m), weight 56.4 kg, SpO2 100 %.    FiO2 (%):  [28 %] 28 %   Intake/Output Summary (Last 24 hours) at 03/09/2020 1128 Last data filed at 03/09/2020 1000 Gross per 24 hour  Intake 1440 ml  Output 750 ml  Net 690 ml   Filed Weights   03/07/20 0930 03/07/20 1245 03/08/20 0437  Weight: 59 kg 58.5 kg 56.4 kg    Examination:   GEN: chronically ill man on vent HEENT: trach in place, minimal secretions CV: RRR, ext warm PULM: tachypneic, bilateral rhonchi GI: Soft, +BS, PEG and ostomy in place EXT: +muscle wasting,  no edema NEURO: moves all 4 ext but weak  No new labs  Resolved Hospital Problem list     Assessment & Plan:   Acute on chronic hypoxemic respiratory failure requiring tracheostomy, 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  -  Continue trach collar - Continue working with PMV - Secretions and overall frailty currently would prevent decannulation.  - Duonebs + CPT - Volume removal by HD as tolerated.  - Awaiting PCU bed - Disposition per CM/primary discussion  Other issues per primary Enterococcus/ MRSE bacteremia HD cath sepsis  Sacral osteo/ decub stage IV  Afib S/p PEG, diverting ostomy  Awaiting bed  Sherrilyn Rist, MD [place] P[facility]M Pager: [phone]

## 2020-03-10 LAB — CBC WITH DIFFERENTIAL/PLATELET
Abs Immature Granulocytes: 0.47 10*3/uL — ABNORMAL HIGH (ref 0.00–0.07)
Basophils Absolute: 0.1 10*3/uL (ref 0.0–0.1)
Basophils Relative: 1 %
Eosinophils Absolute: 0.6 10*3/uL — ABNORMAL HIGH (ref 0.0–0.5)
Eosinophils Relative: 2 %
HCT: 32.4 % — ABNORMAL LOW (ref 39.0–52.0)
Hemoglobin: 9.9 g/dL — ABNORMAL LOW (ref 13.0–17.0)
Immature Granulocytes: 2 %
Lymphocytes Relative: 12 %
Lymphs Abs: 3.1 10*3/uL (ref 0.7–4.0)
MCH: 25.6 pg — ABNORMAL LOW (ref 26.0–34.0)
MCHC: 30.6 g/dL (ref 30.0–36.0)
MCV: 83.7 fL (ref 80.0–100.0)
Monocytes Absolute: 2 10*3/uL — ABNORMAL HIGH (ref 0.1–1.0)
Monocytes Relative: 8 %
Neutro Abs: 19.2 10*3/uL — ABNORMAL HIGH (ref 1.7–7.7)
Neutrophils Relative %: 75 %
Platelets: 260 10*3/uL (ref 150–400)
RBC: 3.87 MIL/uL — ABNORMAL LOW (ref 4.22–5.81)
RDW: 19.5 % — ABNORMAL HIGH (ref 11.5–15.5)
WBC: 25.4 10*3/uL — ABNORMAL HIGH (ref 4.0–10.5)
nRBC: 0 % (ref 0.0–0.2)

## 2020-03-10 LAB — GLUCOSE, CAPILLARY
Glucose-Capillary: 104 mg/dL — ABNORMAL HIGH (ref 70–99)
Glucose-Capillary: 136 mg/dL — ABNORMAL HIGH (ref 70–99)
Glucose-Capillary: 143 mg/dL — ABNORMAL HIGH (ref 70–99)
Glucose-Capillary: 154 mg/dL — ABNORMAL HIGH (ref 70–99)
Glucose-Capillary: 155 mg/dL — ABNORMAL HIGH (ref 70–99)
Glucose-Capillary: 155 mg/dL — ABNORMAL HIGH (ref 70–99)
Glucose-Capillary: 164 mg/dL — ABNORMAL HIGH (ref 70–99)

## 2020-03-10 NOTE — Progress Notes (Signed)
[facility] Progress Note  Subjective: has remained on TC. S/p HD yesterday UF tolerated 0.7L.    Vitals:   03/10/20 0600 03/10/20 0700 03/10/20 0800 03/10/20 0819  BP: 119/64 108/72 123/68   Pulse: 69 81 (!) 53 84  Resp: (!) 44 (!) 30 (!) 27 (!) 29  Temp:  99.9 F (37.7 C)    TempSrc:  Axillary    SpO2: 100% 100% 100%   Weight:      Height:        Exam:   In ICU on TC, alert and somewhat interactive  no jvd  Chest cta bilat ant/ lat  Cor reg no RG  Abd soft ntnd no ascites, +llq ostomy, + peg tube   Ext  No edema   Neuro: can mouth yes or no to some simple questions.     Summary: came from [facility] to [facility] after [place] hospitalization which included mech ventilation and trach placement. At [facility] had severe infections/ bacteremia/ PNA and developed AKI felt due to IV gent toxicity requiring initiation of HD 12/16/19. Admitted to ICU [facility] for trach bleed on 5/19. Was getting MWF HD at [facility] prior to admit here.   OP HD: started March 2021 at [facility] as above MWF   Getting HD here 3.5- 4h, UF 0.5- 1.5 L, hep 2000 prn   admit 62.8kg > range 56.9- 64.7kg here  Assessment/ Plan: 1. 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 On IV abx. Get volume down as tol w/ HD. Doing fairly well on TC now though RR stays in 30-40s.  2. Enterococcus/ MRSE bacteremia HD cath sepsis - TDC removed 5/26, per ID getting 6 wks vanc/ fortaz/ flagyl for polymicrobial sepsis and osteomyelitis.  3. Sacral osteo/ decub stage IV - per ID, also abx as above 4. ESRD/ AKI - on HD since mid March 2021, d/t gent toxicity. HD MWF at [facility]. S/P new TDC 6/2 by IR.  HD Sat, next planned Tues.   5. Trach bleed - resolved 6. Tracheobronchitis - sp course of doxycycline 7. AMS - variable, seems overall improved 8. Atrial fib - per primary, on eliquis 2.5 BID 9. Anemia ckd - transfuse prn, Hb 9.6. 10. Hypercalcemia:  w/ low PTH, prob from immobility, corrCa  11.0 > 10.5>10.3, improved. Cont low 2.0 Ca++ bath , PTH 29 11. BP/vol - no sig vol excess now, wt way down c/w admission. 12. SP peg/ divert colostomy 13. SP COVID pna - original problem in Feb at OSH 14. DNR, poor prognosis, LTACH only option for disposition given needs but apparently not a candidate.  Appreciate palliative care getting involved, their note was reviewed.  Hopefully patient can participate in the discussion to take the burden off family to make more palliative decisions --> he has a speaking valve in place now and PM tried to conversate with him yesterday but it was challenging and they think he may have some delerium to complicate things.  For now cont plan of care.   Jannifer Hick MD [facility] Pager [phone]   Recent Labs  Lab 03/04/20 1255 03/05/20 0253 03/07/20 0307 03/08/20 0530 03/09/20 0334 03/09/20 1110 03/10/20 0307  K 3.1*   < > 3.8  --   --  3.9  --   BUN 117*   < > 147*  --   --  150*  --   CREATININE 3.37*   < > 3.90*  --   --  3.89*  --   CALCIUM 9.8   < > 10.4*  --   --  10.3  --   PHOS 3.2  --   --   --   --   --   --   HGB  --    < > 9.2*   < > 9.6*  --  9.9*   < > = values in this interval not displayed.   Inpatient medications: . apixaban  2.5 mg Per Tube BID  . chlorhexidine gluconate (MEDLINE KIT)  15 mL Mouth Rinse BID  . Chlorhexidine Gluconate Cloth  6 each Topical Q0600  . feeding supplement (PRO-STAT SUGAR FREE 64)  30 mL Per Tube BID  . insulin aspart  0-6 Units Subcutaneous Q4H  . insulin aspart  2 Units Subcutaneous Q4H  . ipratropium-albuterol  3 mL Nebulization QID  . levothyroxine  25 mcg Per Tube Q0600  . mouth rinse  15 mL Mouth Rinse 10 times per day  . metoprolol tartrate  12.5 mg Per Tube BID  . metroNIDAZOLE  500 mg Per Tube Q8H  . nutrition supplement (JUVEN)  1 packet Per Tube BID BM  . pantoprazole sodium  40 mg Per Tube Daily  . vancomycin  500 mg Intravenous Q T,Th,Sa-HD   . sodium chloride     . sodium chloride    . sodium chloride Stopped (03/09/20 1302)  . cefTAZidime (FORTAZ)  IV Stopped (03/09/20 1844)  . feeding supplement (NEPRO CARB STEADY) 50 mL/hr at 03/10/20 0400   sodium chloride, sodium chloride, sodium chloride, acetaminophen (TYLENOL) oral liquid 160 mg/5 mL, alteplase, docusate, fentaNYL (SUBLIMAZE) injection, heparin, heparin, heparin, heparin, lidocaine (PF), lidocaine-prilocaine, midazolam, midazolam, pentafluoroprop-tetrafluoroeth, polyethylene glycol

## 2020-03-10 NOTE — Progress Notes (Signed)
NAME:  Shane Banks, MRN:  [id number], DOB:  03-Jul-1946, LOS: 5 ADMISSION DATE:  02/14/2020, CONSULTATION DATE:  5/19 REFERRING MD:  Dr. Alvino Chapel, CHIEF COMPLAINT:  Hemoptysis   Brief History   74 yo m with PMHx of afib with RVR, BPH, stroke, and COVID ARDS in January requiring trach and LTACH placement at [facility]. Had been tolerating trach collar, but on 5/19 developed hemoptysis and hypoxemic respiratory failure requiring vent. Transferred to [facility] on 5/19.  Past Medical History   has a past medical history of Acute on chronic respiratory failure with hypoxia ([place]), Atrial fibrillation with RVR ([place]), BPH (benign prostatic hyperplasia), COVID-19 virus infection, Pneumonia due to COVID-19 virus, Severe sepsis ([place]), and Stroke ([place]).  Significant Hospital Events   1/8  Admit to [facility] for COVID PNA 2/23 Transfer to [facility] on vent 5/19 Transfer to [facility] for hemoptysis. #6 cuffed trach placed. Back on vent. Bronch performed 5/20 Transitioned to trach collar.  5/22 back on vent full time 5/24 back on antibiotics for fevers 6/1 Initiating PSV weans 6/3 Large cuff leak, trach exchange 6/7 transitioned to [facility] 6/9 remains on TC continuously since 6/7  Consults:  ENT Nephrology ID PCCM  Procedures:  ENT flex scope 5/19 > no bleeding source identified FOB 5/19 > no pulmonary bleeding source identified.   Significant Diagnostic Tests:  CXR 5/28 >> R CVC placement confirmed Slightly improved aeration at the right base. 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 Edema in the muscles around the hips and in the posterior paraspinal musculature of the lower lumbar spine and in the buttocks which could represent myositis. Fluid-fluid level in the bladder may represent protein or debris in  the bladder. The possibility of urinary tract infection should be considered.  CXR 6/3> improved R lung aeration, worsening L sided opacity. Tracheostomy tube, HD catheter.   CXR 6/4>wosened R lung infiltrates and stable L sided infiltrate. Possible bilateral pleural effusions   Micro Data:    tracheal aspirate 5/7> for Pseudomonas A  BAL 5/19 > no growth 5/19 blood > NG 5/22 trach aspirate>> candida parasipolis 5/22 blood>> 1/4 GPC> staph epi 5/25 blood>> GPC (enterococcus on biofire) 2/4 bottles>> E. Faecalis, staph epi. 5/25 trach aspirate>>many PMN, few GPR, rare GPC> diphtheroids 5/25 blood cx>> + for Enterococcus Faecalis (gent resistant)/ Staphylococcus Epidermidis (tetracycline, vanc, rifampin sensitive) 5/27 BCx> ngtd    Antimicrobials:  Levofloxacin 5/24>5/26 vanc 5/25> Ceftriaxone  5/27>6/1 Metronidazole 5/27>  [name] 6/1>  Interim history/subjective:  No overnight events Remains stable on trach collar  Objective   Blood pressure 123/68, pulse 84, temperature 99.9 F (37.7 C), temperature source Axillary, resp. rate (!) 29, height _0  (1.753 m), weight 58.2 kg, SpO2 100 %.    FiO2 (%):  [28 %] 28 %   Intake/Output Summary (Last 24 hours) at 03/10/2020 0923 Last data filed at 03/10/2020 0800 Gross per 24 hour  Intake 1405 ml  Output 900 ml  Net 505 ml   Filed Weights   03/08/20 0437 03/09/20 1336 03/09/20 1710  Weight: 56.4 kg 59 kg 58.2 kg    Examination:   GEN: chronically ill man on vent HEENT: trach in place, minimal secretions CV: RRR, ext warm PULM: tachypneic, bilateral rhonchi GI: Soft, +BS, PEG and ostomy in place  White count 25.4-remains stable BUN 150, creatinine 3.89 on 6/12  Resolved Hospital Problem list     Assessment & Plan:   Acute on chronic hypoxemic respiratory failure requiring tracheostomy, 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  - Continue trach collar - Continue working with  PMV - Secretions and overall frailty currently would prevent decannulation.  - Duonebs + CPT - Volume removal by HD as tolerated.  - Awaiting PCU bed - Disposition per CM/primary discussion  Other issues per primary Enterococcus/ MRSE bacteremia HD cath sepsis  Sacral osteo/ decub stage IV  Afib S/p PEG, diverting ostomy  Awaiting bed  Sherrilyn Rist, MD [place] PCCM Pager: [phone]

## 2020-03-10 NOTE — Progress Notes (Addendum)
Pharmacy Antibiotic Note  Shane Banks is a 74 y.o. male admitted on 02/14/2020 with CoNS/E.faecalis bacteremia/sacral osteomyelitis/pneumonia. 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 Line pulled 5/26, line holiday and had it replaced 5/28. TTE negative. Repeat BCx are negative. ID planning 6 weeks of therapy.  Last HD yesterday  Height: 5\' 9"  (175.3 cm) Weight: 58.2 kg (128 lb 4.9 oz) IBW/kg (Calculated) : 70.7  Temp (24hrs), Avg:98.4 F (36.9 C), Min:97.6 F (36.4 C), Max:99.9 F (37.7 C)  Recent Labs  Lab 03/04/20 0220 03/04/20 1255 03/05/20 0253 03/05/20 0253 03/06/20 0249 03/07/20 0307 03/07/20 1000 03/08/20 0530 03/09/20 0334 03/09/20 1110 03/10/20 0307  WBC   < >  --  22.8*   < > 23.7* 25.6*  --  25.0* 22.2*  --  25.4*  CREATININE  --  3.37* 1.83*  --  2.88* 3.90*  --   --   --  3.89*  --   VANCORANDOM  --   --   --   --   --   --  20  --   --   --   --    < > = values in this interval not displayed.    Estimated Creatinine Clearance: 14.1 mL/min (A) (by C-G formula based on SCr of 3.89 mg/dL (H)).    Allergies  Allergen Reactions  . Aspirin Rash  . Cephalexin Other (See Comments)    dizziness  . Penicillins     Itching,swelling    Plan: Vanc 500mg  IV qHD  Fortaz  2gm IV qHD Flagyl 500mg  PT Q8H per MD Monitor HD schedule/tolerance, weekly vanc trough Abx end date - 04/04/20 per ID Will f/u in am planned HD schedule for the week - renal note today says next [place], PharmD, BCPS, BCCCP Clinical Pharmacist [phone]  Please check AMION for all [place] numbers  03/10/2020 9:39 AM

## 2020-03-10 NOTE — Progress Notes (Signed)
PROGRESS NOTE    Shane Banks  ZOX:[id number] DOB: 06-08-1946 DOA: 02/14/2020 PCP: [name], MD    Brief Narrative:  Patient is a 74 year old male with past medical history of chronic hypoxic respiratory failure status post tracheostomy, ESRD on dialysis, atrial fibrillation, nonhemorrhagic stroke who was admitted from [place] for the evaluation of trach site bleeding.  Patient had recent history of Covid pneumonia and was on ventilator because he progressed to ARDS.  Because of little improvement in the ventilatory and oxygenation, he underwent tracheostomy and was discharged to [facility].  He had recent history of UTI, multidrug-resistant Pseudomonas pneumonia, stenotrophomonas bacteremia.  Because of sepsis and shock, he developed renal failure requiring dialysis.  On 5/19, he developed hemoptysis and was transferred to [facility] ED for evaluation.  PCCM,ID,Nephrology following here.  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  He also underwent tracheostomy tube change by PCCM for persistent leak.  Due to lack of insurance, he cannot go back to [employer].  Social worker assisting on placement.  Anticipate prolonged hospitalization.  1/8  Admit to [facility] for COVID PNA 2/23 Transfer to [facility] on vent 5/19 Transfer to [facility] for hemoptysis. #6 cuffed trach placed. Back on vent. Bronch performed 5/20 Transitioned to trach collar.  5/22 back on vent full time 5/24 back on antibiotics for fevers 6/1 Initiating PSV weans 6/3 Large cuff leak, trach exchange 6/7 transitioned to [facility] 6/9 remains on TC continuously since 6/7  Consultants:   palliative ENT Nephrology ID PCCM  Procedures: ENT flex scope 5/19 > no bleeding source identified FOB 5/19 >no pulmonary bleeding source identified. 6/2: Tunnel catheter placement by IR. 6/4: Changed trach tube  Antimicrobials:   Metronidazole, vancomycin, Fortaz   Subjective: No issues per nsg.hr  better. Nodes no to when I ask if abd pain, cp, dizziness.  Objective: Vitals:   03/10/20 0425 03/10/20 0500 03/10/20 0600 03/10/20 0700  BP:  110/65 119/64 108/72  Pulse: (!) 52 83 69 81  Resp: (!) 32 (!) 29 (!) 44 (!) 30  Temp:    99.9 F (37.7 C)  TempSrc:    Axillary  SpO2: 100% 100% 100% 100%  Weight:      Height:        Intake/Output Summary (Last 24 hours) at 03/10/2020 0755 Last data filed at 03/10/2020 0400 Gross per 24 hour  Intake 1405 ml  Output 900 ml  Net 505 ml   Filed Weights   03/08/20 0437 03/09/20 1336 03/09/20 1710  Weight: 56.4 kg 59 kg 58.2 kg    Examination:  General exam: Appears calm and comfortable , on trach. ,  , NAD Respiratory system: anteriorly Clear to auscultation , no r/w/r Cardiovascular system: S1 & S2 heard, RRR. No JVD, murmurs, rubs, gallops or clicks.  Gastrointestinal system: Abdomen is nondistended, soft and nontender. Normal bowel sounds heard.+ostomy ,+peg in place Central nervous system: Grossly appears intact, awake, alert Extremities: no edema or cyanosis Skin: warm, dry Psych: Mood appropriate for current setting    Data Reviewed: I have personally reviewed following labs and imaging studies  CBC: Recent Labs  Lab 03/06/20 0249 03/07/20 0307 03/08/20 0530 03/09/20 0334 03/10/20 0307  WBC 23.7* 25.6* 25.0* 22.2* 25.4*  NEUTROABS 17.0* 19.1* 18.8* 16.5* 19.2*  HGB 9.2* 9.2* 9.5* 9.6* 9.9*  HCT 29.8* 29.1* 31.1* 30.8* 32.4*  MCV 83.9 82.7 83.8 82.8 83.7  PLT 347 360 342 311 811   Basic Metabolic Panel: Recent Labs  Lab 03/04/20 1255  03/05/20 0253 03/06/20 0249 03/07/20 0307 03/09/20 1110  NA 136 135 137 140 138  K 3.1* 3.4* 3.8 3.8 3.9  CL 97* 98 99 102 99  CO2 _0 GLUCOSE 142* 139* 137* 132* 154*  BUN 117* 51* 98* 147* 150*  CREATININE 3.37* 1.83* 2.88* 3.90* 3.89*  CALCIUM 9.8 9.2 9.9 10.4* 10.3  PHOS 3.2  --   --   --   --    GFR: Estimated Creatinine Clearance: 14.1 mL/min (A) (by  C-G formula based on SCr of 3.89 mg/dL (H)). Liver Function Tests: Recent Labs  Lab 03/04/20 1255  ALBUMIN 2.1*   No results for input(s): LIPASE, AMYLASE in the last 168 hours. No results for input(s): AMMONIA in the last 168 hours. Coagulation Profile: No results for input(s): INR, PROTIME in the last 168 hours. Cardiac Enzymes: No results for input(s): CKTOTAL, CKMB, CKMBINDEX, TROPONINI in the last 168 hours. BNP (last 3 results) No results for input(s): PROBNP in the last 8760 hours. HbA1C: No results for input(s): HGBA1C in the last 72 hours. CBG: Recent Labs  Lab 03/09/20 1516 03/09/20 2011 03/10/20 0005 03/10/20 0326 03/10/20 0734  GLUCAP 123* 196* 143* 104* 155*   Lipid Profile: No results for input(s): CHOL, HDL, LDLCALC, TRIG, CHOLHDL, LDLDIRECT in the last 72 hours. Thyroid Function Tests: No results for input(s): TSH, T4TOTAL, FREET4, T3FREE, THYROIDAB in the last 72 hours. Anemia Panel: No results for input(s): VITAMINB12, FOLATE, FERRITIN, TIBC, IRON, RETICCTPCT in the last 72 hours. Sepsis Labs: No results for input(s): PROCALCITON, LATICACIDVEN in the last 168 hours.  Recent Results (from the past 240 hour(s))  Culture, blood (routine x 2)     Status: None   Collection Time: 03/03/20  9:13 AM   Specimen: BLOOD RIGHT HAND  Result Value Ref Range Status   Specimen Description BLOOD RIGHT HAND  Final   Special Requests   Final    BOTTLES DRAWN AEROBIC ONLY Blood Culture adequate volume   Culture   Final    NO GROWTH 5 DAYS Performed at [facility], [address] [place], [place] [address]    Report Status 03/08/2020 FINAL  Final  Culture, blood (routine x 2)     Status: None   Collection Time: 03/03/20  9:13 AM   Specimen: BLOOD LEFT HAND  Result Value Ref Range Status   Specimen Description BLOOD LEFT HAND  Final   Special Requests   Final    BOTTLES DRAWN AEROBIC ONLY Blood Culture adequate volume   Culture   Final    NO GROWTH 5  DAYS Performed at [facility], [place] [address], [place], [place] [address]    Report Status 03/08/2020 FINAL  Final         Radiology Studies: No results found.      Scheduled Meds:  apixaban  2.5 mg Per Tube BID   chlorhexidine gluconate (MEDLINE KIT)  15 mL Mouth Rinse BID   Chlorhexidine Gluconate Cloth  6 each Topical Q0600   feeding supplement (PRO-STAT SUGAR FREE 64)  30 mL Per Tube BID   insulin aspart  0-6 Units Subcutaneous Q4H   insulin aspart  2 Units Subcutaneous Q4H   ipratropium-albuterol  3 mL Nebulization QID   levothyroxine  25 mcg Per Tube Q0600   mouth rinse  15 mL Mouth Rinse 10 times per day   metoprolol tartrate  12.5 mg Per Tube BID   metroNIDAZOLE  500 mg  Per Tube Q8H   nutrition supplement (JUVEN)  1 packet Per Tube BID BM   pantoprazole sodium  40 mg Per Tube Daily   vancomycin  500 mg Intravenous Q T,Th,Sa-HD   Continuous Infusions:  sodium chloride     sodium chloride     sodium chloride Stopped (03/09/20 1302)   cefTAZidime (FORTAZ)  IV Stopped (03/09/20 1844)   feeding supplement (NEPRO CARB STEADY) 50 mL/hr at 03/10/20 0400    Assessment & Plan:   Principal Problem:   Enterococcal bacteremia Active Problems:   Acute respiratory failure (HCC)   Hemoptysis   Pressure injury of skin   Protein-calorie malnutrition, severe   Decubitus ulcer of sacral region, stage 4 (HCC)   Fever   Palliative care by specialist   Goals of care, counseling/discussion   DNR (do not resuscitate)   Acute on chronic hypoxemic respiratory failure requiring tracheostomy, 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  Status post trach due to respiratory failure from Covid pneumonia.  He is on trach collar. PCCM following.  Most recently pneumonia has been multidrug-resistant Pseudomonas for which she was treated with Avycaz and [facility]cin [facility]bul[facility]. Possible ongoing aspiration also.    Presented with hemoptysis, aspiration of blood.  Patient has cuffed trach placed by ENT.  No active bleeding on bronchoscopy.     He also underwent tracheostomy tube change by PCCM for persistent leak on 03/01/20. Chest x-ray done on 03/03/2020 showed bilateral pleural effusions, stable extensive opacity throughout the left hemithorax. Currently he is on trach collar, requiring 7 L of oxygen per minute.. Per PCCm, trach collar may be destination for pt. duonebs +CPT Continue abx Volume removal by HD   Paroxysmal A. fib: Currently rate is controlled.  Anticoagulation was discontinued due to suspected GI bleed.   currently on low-dose Eliquis.   Not on cardizem?  added low dose toprol xl 12.4m qd , Hr better rate control.  Sepsis/bacteremia: Enterococcus and Staphylococcus epidermidis bacteremia HD cath sepsis TDC removed 5/26,  Also has osteomyelitis fifth sacral segment.   High suspicion for ongoing aspiration.  MRI pelvis/sacrum showed osteomyelitis of fifth sacral segment, edema in the muscle around both hips and proximal thighs with possible myositis.   ID had recommended treatment for 6 weeks for sacral osteomyelitis and staph epidermidis bacteremia with vancomycin, fortaz and metronidazole. IR consulted for bone biopsy but due to high risk  it was not done. He has persistent  leukocytosis.  Continue to monitor CBC.   ESRD: on HD since mid March 2021, d/t gent toxicity.  [facility] Tunnel catheter placement on  6/2 by IR.  HD Sat, next planned Tues.       Chronic normocytic anemia : Secondary to ESRD, critical illness.  Has been transfused with PRBCs.  Currently hemoglobin stable.  Continue to monitor  Unstageable sacral pressure ulcer/osteomyelitis: Wound care following.  Discussion as above.  Cachexia/severe protein malnutrition: On tube feeds.  Nutrition following.  Mild elevated LFTs: Continue to monitor.  [facility]: [facility]  Anticipate  discharge to LTAC/SNF but due to lack of insurance, placement has been difficult.  Social working aSports administrator  Goals of care: DNR.  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  We have requested for palliative  care evaluation.plz see note-trying to have discussion with pt . Per palliative-family had a meeting and that they wish to continue all  aggressive care, including vent and dialysis  Pressure Injury 02/14/20 Sacrum Medial Stage 4 - Full thickness tissue loss with exposed bone, tendon or muscle. (Active)  02/14/20 1906  Location: Sacrum  Location Orientation: Medial  Staging: Stage 4 - Full thickness tissue loss with exposed bone, tendon or muscle.  Wound Description (Comments):   Present on Admission: Yes     Pressure Injury 03/03/20 Neck Anterior Stage 3 -  Full thickness tissue loss. Subcutaneous fat may be visible but bone, tendon or muscle are NOT exposed. Device related injury under trach tube.  (Active)  03/03/20 1805  Location: Neck  Location Orientation: Anterior  Staging: Stage 3 -  Full thickness tissue loss. Subcutaneous fat may be visible but bone, tendon or muscle are NOT exposed.  Wound Description (Comments): Device related injury under trach tube.   Present on Admission: No   Nutrition Problem: Severe Malnutrition Etiology: chronic illness(chronic respiratory failure related to COVID ARDS)  DVT prophylaxis: Eliquis Code Status: DNR Family Communication: None at bedside Disposition Plan:  Status is: Inpatient  Remains inpatient appropriate because:Unsafe d/c plan   Dispo: The patient is from: [facility]  Anticipated d/c is to: SNF/LTAC  Anticipated d/c date is: Unknown     LOS: 25 days   Time spent:45 min with >50% on coc    Nolberto Hanlon, MD [facility] Pager [phone]  If 7PM-7AM, please contact night-coverage [email or web address] Password  [facility] 03/10/2020, 7:55 AM Patient ID: Shane Banks, male   DOB: Dec 08, 1945, 74 y.o.  Patient ID: Shane Banks, male   DOB: 1946-03-10, 74 y.o.   MRN: [id number]

## 2020-03-11 ENCOUNTER — Inpatient Hospital Stay (HOSPITAL_COMMUNITY): Payer: Medicare HMO

## 2020-03-11 LAB — BASIC METABOLIC PANEL
Anion gap: 11 (ref 5–15)
BUN: 127 mg/dL — ABNORMAL HIGH (ref 8–23)
CO2: 29 mmol/L (ref 22–32)
Calcium: 10.1 mg/dL (ref 8.9–10.3)
Chloride: 98 mmol/L (ref 98–111)
Creatinine, Ser: 3.28 mg/dL — ABNORMAL HIGH (ref 0.61–1.24)
GFR calc Af Amer: 21 mL/min — ABNORMAL LOW (ref >60)
GFR calc non Af Amer: 18 mL/min — ABNORMAL LOW (ref >60)
Glucose, Bld: 136 mg/dL — ABNORMAL HIGH (ref 70–99)
Potassium: 4.2 mmol/L (ref 3.5–5.1)
Sodium: 138 mmol/L (ref 135–145)

## 2020-03-11 LAB — CBC WITH DIFFERENTIAL/PLATELET
Abs Immature Granulocytes: 0.26 10*3/uL — ABNORMAL HIGH (ref 0.00–0.07)
Basophils Absolute: 0.1 10*3/uL (ref 0.0–0.1)
Basophils Relative: 1 %
Eosinophils Absolute: 0.8 10*3/uL — ABNORMAL HIGH (ref 0.0–0.5)
Eosinophils Relative: 4 %
HCT: 30.9 % — ABNORMAL LOW (ref 39.0–52.0)
Hemoglobin: 9.5 g/dL — ABNORMAL LOW (ref 13.0–17.0)
Immature Granulocytes: 1 %
Lymphocytes Relative: 13 %
Lymphs Abs: 2.8 10*3/uL (ref 0.7–4.0)
MCH: 25.8 pg — ABNORMAL LOW (ref 26.0–34.0)
MCHC: 30.7 g/dL (ref 30.0–36.0)
MCV: 84 fL (ref 80.0–100.0)
Monocytes Absolute: 2 10*3/uL — ABNORMAL HIGH (ref 0.1–1.0)
Monocytes Relative: 10 %
Neutro Abs: 15.2 10*3/uL — ABNORMAL HIGH (ref 1.7–7.7)
Neutrophils Relative %: 71 %
Platelets: 272 10*3/uL (ref 150–400)
RBC: 3.68 MIL/uL — ABNORMAL LOW (ref 4.22–5.81)
RDW: 19.2 % — ABNORMAL HIGH (ref 11.5–15.5)
WBC: 21.1 10*3/uL — ABNORMAL HIGH (ref 4.0–10.5)
nRBC: 0 % (ref 0.0–0.2)

## 2020-03-11 LAB — GLUCOSE, CAPILLARY
Glucose-Capillary: 131 mg/dL — ABNORMAL HIGH (ref 70–99)
Glucose-Capillary: 149 mg/dL — ABNORMAL HIGH (ref 70–99)
Glucose-Capillary: 156 mg/dL — ABNORMAL HIGH (ref 70–99)
Glucose-Capillary: 158 mg/dL — ABNORMAL HIGH (ref 70–99)
Glucose-Capillary: 162 mg/dL — ABNORMAL HIGH (ref 70–99)
Glucose-Capillary: 183 mg/dL — ABNORMAL HIGH (ref 70–99)

## 2020-03-11 MED ORDER — CHLORHEXIDINE GLUCONATE CLOTH 2 % EX PADS
6.0000 | MEDICATED_PAD | Freq: Every day | CUTANEOUS | Status: DC
  Administered 2020-03-12 – 2020-03-14 (×3): 6 via TOPICAL

## 2020-03-11 NOTE — Progress Notes (Addendum)
Pt transported to 6N by bed.  Spoke w/ pts sister to provide updates and new bed number and unit.

## 2020-03-11 NOTE — Progress Notes (Signed)
[facility] Progress Note  Subjective: pt seen in ICU, not responding today  Vitals:   03/11/20 1109 03/11/20 1119 03/11/20 1200 03/11/20 1239  BP:  133/90  125/72  Pulse:  83 80 87  Resp:  (!) 36 (!) 36 (!) 27  Temp: 98.5 F (36.9 C)     TempSrc: Axillary     SpO2:   97% 98%  Weight:      Height:        Exam:   on TC, up in chair  no jvd  Chest cta bilat  Cor reg no RG  Abd soft ntnd no ascites, +llq ostomy, +peg tube   Ext no LE edema    Can mouth yes or no to some simple questions         Summary: came from [facility] to [facility] after COVID hospitalization which included mech ventilation and trach placement. At [facility] had severe infections/ bacteremia/ PNA and developed AKI felt due to IV gent toxicity requiring initiation of HD 12/16/19. Admitted to ICU [facility] for trach bleed on 5/19. Was getting MWF HD at [facility] prior to admit here.   OP HD: started March 2021 at [facility] as above MWF   Getting HD here 3.5- 4h, UF 0.5- 1.5 L, hep 2000 prn   admit 62.8kg > range 56.9- 64.7kg here  Assessment/ Plan: 1. 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 On IV abx. Get volume down as tol w/ HD. Doing fairly well on TC now though RR stays in 30-40s.  2. Enterococcus/ MRSE bacteremia HD cath sepsis - TDC removed 5/26, per ID getting 6 wks vanc/ fortaz/ flagyl for polymicrobial sepsis and osteomyelitis.  3. Sacral osteo/ decub stage IV - per ID, also abx as above 4. ESRD/ AKI - on HD since mid March 2021, d/t gent toxicity. HD MWF at [facility]. S/P new TDC 6/2 by IR.  HD Sat, next HD planned forTues.   5. Trach bleed - resolved 6. Tracheobronchitis - sp course of doxycycline 7. AMS - variable, seems overall improved 8. Atrial fib - per primary, on eliquis 2.5 BID 9. Anemia ckd - transfuse prn, Hb 9.6. 10. Hypercalcemia:  w/ low PTH, prob from immobility, corrCa 11.0 > 10.5>10.3, improved. Cont low 2.0 Ca++ bath , PTH 29 11. BP/vol - no  sig vol excess now, wt way down c/w admission. 12. SP peg/ divert colostomy 13. SP COVID pna - original problem in Feb at OSH 14. DNR/ poor prognosis-  LTACH only option for disposition given needs but apparently not a candidate.  Appreciate palliative care getting involved, their note was reviewed.  Hopefully patient can participate in the discussion to take the burden off family to make more palliative decisions --> he has a speaking valve in place now and PM tried to converse with him yesterday but it was challenging and they think he may have some delerium to complicate things.  For now cont plan of care.    Shane Banks 03/11/2020, 1:37 PM   Recent Labs  Lab 03/09/20 0334 03/09/20 1110 03/10/20 0307 03/11/20 0236  K  --  3.9  --  4.2  BUN  --  150*  --  127*  CREATININE  --  3.89*  --  3.28*  CALCIUM  --  10.3  --  10.1  HGB   < >  --  9.9* 9.5*   < > = values in this interval not displayed.  Inpatient medications: . apixaban  2.5 mg Per Tube BID  . chlorhexidine gluconate (MEDLINE KIT)  15 mL Mouth Rinse BID  . Chlorhexidine Gluconate Cloth  6 each Topical Q0600  . feeding supplement (PRO-STAT SUGAR FREE 64)  30 mL Per Tube BID  . insulin aspart  0-6 Units Subcutaneous Q4H  . insulin aspart  2 Units Subcutaneous Q4H  . ipratropium-albuterol  3 mL Nebulization QID  . levothyroxine  25 mcg Per Tube Q0600  . mouth rinse  15 mL Mouth Rinse 10 times per day  . metoprolol tartrate  12.5 mg Per Tube BID  . metroNIDAZOLE  500 mg Per Tube Q8H  . nutrition supplement (JUVEN)  1 packet Per Tube BID BM  . pantoprazole sodium  40 mg Per Tube Daily  . vancomycin  500 mg Intravenous Q T,Th,Sa-HD   . sodium chloride    . sodium chloride    . sodium chloride Stopped (03/09/20 1302)  . cefTAZidime (FORTAZ)  IV Stopped (03/09/20 1844)  . feeding supplement (NEPRO CARB STEADY) 1,000 mL (03/10/20 1956)   sodium chloride, sodium chloride, sodium chloride, acetaminophen (TYLENOL) oral  liquid 160 mg/5 mL, alteplase, docusate, fentaNYL (SUBLIMAZE) injection, heparin, heparin, heparin, heparin, lidocaine (PF), lidocaine-prilocaine, midazolam, midazolam, pentafluoroprop-tetrafluoroeth, polyethylene glycol

## 2020-03-11 NOTE — TOC Progression Note (Signed)
Transition of Care [facility]) - Progression Note    Patient Details  Name: Shane Banks MRN: [id number] Date of Birth: [birth year]  Transition of Care [facility]) CM/SW [place], [place] Phone Number: 03/11/2020, 3:24 PM  Clinical Narrative:    Acknowledging consult for placement at SNF. Pt tx from 4N/51M- trach, [employer] Medicare only, no Medicaid. DNR, palliative - g tube, trach collar at 28%, cuffed (needs to be cuffless). HD pt, will need to be able to tolerate dialysis in recliner. TOC team continues to follow- placement will be very difficult for this pt due to multiple care needs.     Expected Discharge Plan: Long Term Acute Care (LTAC) Barriers to Discharge: Continued Medical Work up, SNF Pending bed offer  Expected Discharge Plan and Services Expected Discharge Plan: [place] (LTAC) Living arrangements for the past 2 months: Post-Acute Facility  Readmission Risk Interventions No flowsheet data found.

## 2020-03-11 NOTE — Consult Note (Addendum)
WOC Nurse Consult Note: Previously noted stage 3 pressure injury beneath trach is now pink dry healed scar tissue; approx .2X.2cm, no further open wound or drainage.  Procedure/placement/frequency: Continue Silicone foam dressing to protect from further injury. Please re-consult if further assistance is needed.  Thank-you,  Julien Girt MSN, [place], [place], [place], [place]

## 2020-03-11 NOTE — Progress Notes (Signed)
Patient RR    03/11/20 1503  Assess: MEWS Score  Pulse Rate 87  Resp (!) 28  SpO2 98 %  O2 Device Tracheostomy Collar  O2 Flow Rate (L/min) 5 L/min  FiO2 (%) 28 %  Assess: MEWS Score  MEWS Temp 0  MEWS Systolic 0  MEWS Pulse 0  MEWS RR 2  MEWS LOC 0  MEWS Score 2  MEWS Score Color Yellow  Assess: if the MEWS score is Yellow or Red  Were vital signs taken at a resting state? Yes  Focused Assessment Documented focused assessment  Early Detection of Sepsis Score *See Row Information* Medium  MEWS guidelines implemented *See Row Information* Yes  Treat  MEWS Interventions Other (Comment);Consulted Respiratory Therapy  Take Vital Signs  Increase Vital Sign Frequency  Yellow: Q 2hr X 2 then Q 4hr X 2, if remains yellow, continue Q 4hrs  Notify: Charge Nurse/RN  Name of Charge Nurse/RN Notified z [email or web address]  Date Charge Nurse/RN Notified 03/11/20  Time Charge Nurse/RN Notified 1529  Will continue to monitor RR. Stable no acute distress noted at this time.

## 2020-03-11 NOTE — Progress Notes (Signed)
Called 6N to give report however nurse in patient room. Will call back shortly. Modena Morrow E, RN 03/11/2020 1:00 PM

## 2020-03-11 NOTE — Progress Notes (Signed)
Palliative Medicine RN Note: Discussed pt in team rounds.  Family is not interested in meeting with or speaking with PMT any more, and they have no interest in de-escalating care. At this time, we have no further role in Shane Banks's care and will sign off.  If Shane Banks's family requests to speak with [place], please re-consult [place].  Marjie Skiff Zanovia Rotz, RN, BSN, [facility] Palliative Medicine Team 03/11/2020 9:40 AM Office [phone]

## 2020-03-11 NOTE — Progress Notes (Signed)
NAME:  Shane Banks, MRN:  [id number], DOB:  May 04, 1946, LOS: 64 ADMISSION DATE:  02/14/2020, CONSULTATION DATE:  5/19 REFERRING MD:  Dr. Alvino Chapel, CHIEF COMPLAINT:  Hemoptysis   Brief History   74 yo m with PMHx of afib with RVR, BPH, stroke, and COVID ARDS in January requiring trach and LTACH placement at [facility]. Had been tolerating trach collar, but on 5/19 developed hemoptysis and hypoxemic respiratory failure requiring vent. Transferred to [facility] on 5/19.  Past Medical History   has a past medical history of Acute on chronic respiratory failure with hypoxia ([place]), Atrial fibrillation with RVR ([place]), BPH (benign prostatic hyperplasia), COVID-19 virus infection, Pneumonia due to COVID-19 virus, Severe sepsis ([place]), and Stroke ([place]).  Significant Hospital Events   1/8  Admit to [facility] for COVID PNA 2/23 Transfer to [facility] on vent 5/19 Transfer to [facility] for hemoptysis. #6 cuffed trach placed. Back on vent. Bronch performed 5/20 Transitioned to trach collar.  5/22 back on vent full time 5/24 back on antibiotics for fevers 6/1 Initiating PSV weans 6/3 Large cuff leak, trach exchange 6/7 transitioned to [facility] 6/9 remains on TC continuously since 6/7  Consults:  ENT Nephrology ID PCCM  Procedures:  ENT flex scope 5/19 > no bleeding source identified FOB 5/19 > no pulmonary bleeding source identified.   Significant Diagnostic Tests:  CXR 5/28 >> R CVC placement confirmed Slightly improved aeration at the right base. 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 Edema in the muscles around the hips and in the posterior paraspinal musculature of the lower lumbar spine and in the buttocks which could represent myositis. Fluid-fluid level in the bladder may represent protein or debris in  the bladder. The possibility of urinary tract infection should be considered.  CXR 6/3> improved R lung aeration, worsening L sided opacity. Tracheostomy tube, HD catheter.   CXR 6/4>wosened R lung infiltrates and stable L sided infiltrate. Possible bilateral pleural effusions   Micro Data:    tracheal aspirate 5/7> for Pseudomonas A  BAL 5/19 > no growth 5/19 blood > NG 5/22 trach aspirate>> candida paras[place] 5/22 blood>> 1/4 GPC> staph epi 5/25 blood>> GPC (enterococcus on biofire) 2/4 bottles>> E. Faecalis, staph epi. 5/25 trach aspirate>>many PMN, few GPR, rare GPC> diphtheroids 5/25 blood cx>> + for Enterococcus Faecalis (gent resistant)/ Staphylococcus Epidermidis (tetracycline, vanc, rifampin sensitive) 5/27 BCx> ngtd    Antimicrobials:  Levofloxacin 5/24>5/26 vanc 5/25> Ceftriaxone  5/27>6/1 Metronidazole 5/27>  [name] 6/1>  Interim history/subjective:  No overnight events Remains stable on trach collar  Objective   Blood pressure 138/79, pulse (!) 54, temperature 97.9 F (36.6 C), temperature source Axillary, resp. rate (!) 28, height _0  (1.753 m), weight 57.3 kg, SpO2 97 %.    FiO2 (%):  [25 %-28 %] 28 %   Intake/Output Summary (Last 24 hours) at 03/11/2020 0908 Last data filed at 03/11/2020 0700 Gross per 24 hour  Intake 1270 ml  Output 470 ml  Net 800 ml   Filed Weights   03/09/20 1336 03/09/20 1710 03/11/20 0500  Weight: 59 kg 58.2 kg 57.3 kg    Examination:   GEN: chronically ill man on vent HEENT: trach in place, minimal secretions CV: RRR, ext warm PULM: tachypneic, bilateral rhonchi GI: Soft, +BS, PEG and ostomy in place  White count 25.4-remains stable BUN 150, creatinine 3.89  on 6/12  Resolved Hospital Problem list     Assessment & Plan:   Acute on chronic hypoxemic respiratory failure requiring tracheostomy, 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  - Continue trach collar - Continue  working with PMV - Secretions and overall frailty currently would prevent decannulation.  - Duonebs + CPT - Volume removal by HD as tolerated.  - Disposition per CM/primary discussion - Ready for Med-Surg.  Other issues per primary Enterococcus/ MRSE bacteremia HD cath sepsis  Sacral osteo/ decub stage IV  Afib S/p PEG, diverting ostomy  Kipp Brood, MD [facility] ICU Physician [place]  Pager: [phone] Mobile: [phone] After hours: [phone].  03/11/2020, 9:08 AM

## 2020-03-11 NOTE — Progress Notes (Signed)
PROGRESS NOTE    Shane Banks  OEV:[id number] DOB: 01/26/1946 DOA: 02/14/2020 PCP: [name], MD    Brief Narrative:  Patient is a 74 year old male with past medical history of chronic hypoxic respiratory failure status post tracheostomy, ESRD on dialysis, atrial fibrillation, nonhemorrhagic stroke who was admitted from [place] for the evaluation of trach site bleeding.  Patient had recent history of Covid pneumonia and was on ventilator because he progressed to ARDS.  Because of little improvement in the ventilatory and oxygenation, he underwent tracheostomy and was discharged to [facility].  He had recent history of UTI, multidrug-resistant Pseudomonas pneumonia, stenotrophomonas bacteremia.  Because of sepsis and shock, he developed renal failure requiring dialysis.  On 5/19, he developed hemoptysis and was transferred to [facility] ED for evaluation.  PCCM,ID,Nephrology following here.  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  He also underwent tracheostomy tube change by PCCM for persistent leak.  Due to lack of insurance, he cannot go back to [place].  Social worker assisting on placement.  Anticipate prolonged hospitalization.  1/8  Admit to [facility] for COVID PNA 2/23 Transfer to [facility] on vent 5/19 Transfer to [facility] for hemoptysis. #6 cuffed trach placed. Back on vent. Bronch performed 5/20 Transitioned to trach collar.  5/22 back on vent full time 5/24 back on antibiotics for fevers 6/1 Initiating PSV weans 6/3 Large cuff leak, trach exchange 6/7 transitioned to [facility] 6/9 remains on TC continuously since 6/7  Consultants:   palliative ENT Nephrology ID PCCM  Procedures: ENT flex scope 5/19 > no bleeding source identified FOB 5/19 >no pulmonary bleeding source identified. 6/2: Tunnel catheter placement by IR. 6/4: Changed trach tube  Antimicrobials:   Metronidazole, vancomycin, Fortaz   Subjective: Patient sitting in  chair, trach collar on.  Nods no to my questions of shortness of breath, chest pain, abdominal pain.  Objective: Vitals:   03/11/20 1109 03/11/20 1119 03/11/20 1200 03/11/20 1239  BP:  133/90  125/72  Pulse:  83 80 87  Resp:  (!) 36 (!) 36 (!) 27  Temp: 98.5 F (36.9 C)     TempSrc: Axillary     SpO2:   97% 98%  Weight:      Height:        Intake/Output Summary (Last 24 hours) at 03/11/2020 1339 Last data filed at 03/11/2020 1200 Gross per 24 hour  Intake 1520 ml  Output 470 ml  Net 1050 ml   Filed Weights   03/09/20 1336 03/09/20 1710 03/11/20 0500  Weight: 59 kg 58.2 kg 57.3 kg    Examination:  General exam: Appears calm and comfortable , on trach.  Collar, sitting in chair Respiratory system: anteriorly Clear to auscultation , no r/w/r Cardiovascular system: S1 & S2 heard, RRR. No JVD, murmurs, rubs, gallops or clicks.  Gastrointestinal system: Abdomen is nondistended, soft and nontender. +bs+ostomy ,+peg in place Central nervous system: Grossly appears intact, awake, alert Extremities: no edema or cyanosis Skin: warm, dry Psych: Mood appropriate for current setting    Data Reviewed: I have personally reviewed following labs and imaging studies  CBC: Recent Labs  Lab 03/07/20 0307 03/08/20 0530 03/09/20 0334 03/10/20 0307 03/11/20 0236  WBC 25.6* 25.0* 22.2* 25.4* 21.1*  NEUTROABS 19.1* 18.8* 16.5* 19.2* 15.2*  HGB 9.2* 9.5* 9.6* 9.9* 9.5*  HCT 29.1* 31.1* 30.8* 32.4* 30.9*  MCV 82.7 83.8 82.8 83.7 84.0  PLT 360 342 311 260 829   Basic Metabolic Panel: Recent Labs  Lab 03/05/20  9983 03/06/20 0249 03/07/20 0307 03/09/20 1110 03/11/20 0236  NA 135 137 140 138 138  K 3.4* 3.8 3.8 3.9 4.2  CL 98 99 102 99 98  CO2 _0 GLUCOSE 139* 137* 132* 154* 136*  BUN 51* 98* 147* 150* 127*  CREATININE 1.83* 2.88* 3.90* 3.89* 3.28*  CALCIUM 9.2 9.9 10.4* 10.3 10.1   GFR: Estimated Creatinine Clearance: 16.5 mL/min (A) (by C-G formula based on SCr  of 3.28 mg/dL (H)). Liver Function Tests: No results for input(s): AST, ALT, ALKPHOS, BILITOT, PROT, ALBUMIN in the last 168 hours. No results for input(s): LIPASE, AMYLASE in the last 168 hours. No results for input(s): AMMONIA in the last 168 hours. Coagulation Profile: No results for input(s): INR, PROTIME in the last 168 hours. Cardiac Enzymes: No results for input(s): CKTOTAL, CKMB, CKMBINDEX, TROPONINI in the last 168 hours. BNP (last 3 results) No results for input(s): PROBNP in the last 8760 hours. HbA1C: No results for input(s): HGBA1C in the last 72 hours. CBG: Recent Labs  Lab 03/10/20 1920 03/10/20 2348 03/11/20 0304 03/11/20 0721 03/11/20 1108  GLUCAP 155* 164* 131* 149* 158*   Lipid Profile: No results for input(s): CHOL, HDL, LDLCALC, TRIG, CHOLHDL, LDLDIRECT in the last 72 hours. Thyroid Function Tests: No results for input(s): TSH, T4TOTAL, FREET4, T3FREE, THYROIDAB in the last 72 hours. Anemia Panel: No results for input(s): VITAMINB12, FOLATE, FERRITIN, TIBC, IRON, RETICCTPCT in the last 72 hours. Sepsis Labs: No results for input(s): PROCALCITON, LATICACIDVEN in the last 168 hours.  Recent Results (from the past 240 hour(s))  Culture, blood (routine x 2)     Status: None   Collection Time: 03/03/20  9:13 AM   Specimen: BLOOD RIGHT HAND  Result Value Ref Range Status   Specimen Description BLOOD RIGHT HAND  Final   Special Requests   Final    BOTTLES DRAWN AEROBIC ONLY Blood Culture adequate volume   Culture   Final    NO GROWTH 5 DAYS Performed at [facility], [address], [place], [place] [address]    Report Status 03/08/2020 FINAL  Final  Culture, blood (routine x 2)     Status: None   Collection Time: 03/03/20  9:13 AM   Specimen: BLOOD LEFT HAND  Result Value Ref Range Status   Specimen Description BLOOD LEFT HAND  Final   Special Requests   Final    BOTTLES DRAWN AEROBIC ONLY Blood Culture adequate volume   Culture   Final    NO  GROWTH 5 DAYS Performed at [facility], [place] [address], [place], [place] [address]    Report Status 03/08/2020 FINAL  Final         Radiology Studies: DG Chest Port 1 View  Result Date: 03/11/2020 CLINICAL DATA:  Respiratory failure EXAM: PORTABLE CHEST 1 VIEW COMPARISON:  03/03/2020 FINDINGS: Asymmetric reticular and airspace disease on the left with volume loss. Mild improvement in right lung aeration. Tracheostomy tube in place. Dialysis catheter with tip at the right atrium. No visible effusion or pneumothorax IMPRESSION: Left more than right airspace disease with mild clearing on the right since prior. No new abnormality. Electronically Signed   By: Monte Fantasia M.D.   On: 03/11/2020 07:55        Scheduled Meds: . apixaban  2.5 mg Per Tube BID  . chlorhexidine gluconate (MEDLINE KIT)  15 mL Mouth Rinse BID  . Chlorhexidine Gluconate Cloth  6 each Topical Q0600  .  feeding supplement (PRO-STAT SUGAR FREE 64)  30 mL Per Tube BID  . insulin aspart  0-6 Units Subcutaneous Q4H  . insulin aspart  2 Units Subcutaneous Q4H  . ipratropium-albuterol  3 mL Nebulization QID  . levothyroxine  25 mcg Per Tube Q0600  . mouth rinse  15 mL Mouth Rinse 10 times per day  . metoprolol tartrate  12.5 mg Per Tube BID  . metroNIDAZOLE  500 mg Per Tube Q8H  . nutrition supplement (JUVEN)  1 packet Per Tube BID BM  . pantoprazole sodium  40 mg Per Tube Daily  . vancomycin  500 mg Intravenous Q T,Th,Sa-HD   Continuous Infusions: . sodium chloride    . sodium chloride    . sodium chloride Stopped (03/09/20 1302)  . cefTAZidime (FORTAZ)  IV Stopped (03/09/20 1844)  . 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  Status post trach due to respiratory failure from Covid pneumonia.  He is on trach collar. PCCM following.  Most recently pneumonia has been multidrug-resistant Pseudomonas for which she was treated with Avycaz and mycin nebulizers at [facility]. Possible ongoing aspiration also.   Presented with hemoptysis, aspiration of blood.  Patient has cuffed trach placed by ENT.  No active bleeding on bronchoscopy.     He also underwent tracheostomy tube change by PCCM for persistent leak on 03/01/20. Chest x-ray done on 03/03/2020 showed bilateral pleural effusions, stable extensive opacity throughout the left hemithorax. Currently he is on trach collar, requiring 7 L of oxygen per minute.. Per PCCm, trach collar may be destination for pt. duonebs +CPT Continue abx Volume removal by HD   Paroxysmal A. fib: Currently rate is controlled.  Anticoagulation was discontinued due to suspected GI bleed.   currently on low-dose Eliquis.   Not on cardizem?  added low dose toprol xl 12.46m qd , Hr better rate control.  Sepsis/bacteremia: Enterococcus and Staphylococcus epidermidis bacteremia HD cath sepsis TDC removed 5/26,  Also has osteomyelitis fifth sacral segment.   High suspicion for ongoing aspiration.  MRI pelvis/sacrum showed osteomyelitis of fifth sacral segment, edema in the muscle around both hips and proximal thighs with possible myositis.   ID had recommended treatment for 6 weeks for sacral osteomyelitis and staph epidermidis bacteremia with vancomycin, fortaz and metronidazole. IR consulted for bone biopsy but due to high risk  it was not done. He has persistent  leukocytosis.  Continue to monitor CBC.   ESRD: on HD since mid March  2021, d/t gent toxicity.  [facility] Tunnel catheter placement on  6/2 by IR.  HD Sat, next planned Tues.       Chronic normocytic anemia : Secondary to ESRD, critical illness.  Has been transfused with PRBCs.  Currently hemoglobin stable.  Continue to monitor  Unstageable sacral pressure ulcer/osteomyelitis: Wound care following.  Discussion as above.  Cachexia/severe protein malnutrition: On tube feeds.  Nutrition following.  Mild elevated LFTs: Continue to monitor.  Disposition:  Patient is from [place].  Anticipate discharge to LTAC/SNF but due to lack of insurance, placement has been difficult.  Social working Sports administrator.  Goals of care: DNR.  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  We have requested for palliative  care evaluation.plz see note-trying to have discussion with pt . Per palliative-family had a meeting and that they wish to continue all aggressive care, including vent and dialysis.  Per palliative care on 6/14 family is not interested in meeting with them anymore and have no interest in de-escalating care.  Palliative care has signed off.  Pressure Injury 02/14/20 Sacrum Medial Stage 4 - Full thickness tissue loss with exposed bone, tendon or muscle. (Active)  02/14/20 1906  Location: Sacrum  Location Orientation: Medial  Staging: Stage 4 - Full thickness tissue loss with exposed bone, tendon or muscle.  Wound Description (Comments):   Present on Admission: Yes     Pressure Injury 03/03/20 Neck Anterior Stage 3 -  Full thickness tissue loss. Subcutaneous fat may be visible but bone, tendon or muscle are NOT exposed. Device related injury under trach tube.  (Active)  03/03/20 1805  Location: Neck  Location Orientation: Anterior  Staging: Stage 3 -  Full thickness tissue loss. Subcutaneous fat may be visible but bone, tendon or muscle are NOT exposed.  Wound Description  (Comments): Device related injury under trach tube.   Present on Admission: No   Nutrition Problem: Severe Malnutrition Etiology: chronic illness(chronic respiratory failure related to COVID ARDS)  DVT prophylaxis: Eliquis Code Status: DNR Family Communication: None at bedside Disposition Plan:  Status is: Inpatient  Remains inpatient appropriate because:Unsafe d/c plan   Dispo: The patient is from: [facility]  Anticipated d/c is to: SNF/LTAC  Anticipated d/c date is: Unknown     LOS: 26 days   Time spent:45 min with >50% on coc    Nolberto Hanlon, MD [facility] Pager [phone]  If 7PM-7AM, please contact night-coverage [email or web address] Password [facility] 03/11/2020, 1:39 PM

## 2020-03-12 ENCOUNTER — Inpatient Hospital Stay (HOSPITAL_COMMUNITY): Payer: Medicare HMO

## 2020-03-12 DIAGNOSIS — A419 Sepsis, unspecified organism: Secondary | ICD-10-CM

## 2020-03-12 DIAGNOSIS — J1282 Pneumonia due to coronavirus disease 2019: Secondary | ICD-10-CM

## 2020-03-12 DIAGNOSIS — U071 COVID-19: Secondary | ICD-10-CM

## 2020-03-12 DIAGNOSIS — I4891 Unspecified atrial fibrillation: Secondary | ICD-10-CM

## 2020-03-12 LAB — RENAL FUNCTION PANEL
Albumin: 2.1 g/dL — ABNORMAL LOW (ref 3.5–5.0)
Anion gap: 15 (ref 5–15)
BUN: 188 mg/dL — ABNORMAL HIGH (ref 8–23)
CO2: 26 mmol/L (ref 22–32)
Calcium: 10.6 mg/dL — ABNORMAL HIGH (ref 8.9–10.3)
Chloride: 97 mmol/L — ABNORMAL LOW (ref 98–111)
Creatinine, Ser: 4.44 mg/dL — ABNORMAL HIGH (ref 0.61–1.24)
GFR calc Af Amer: 14 mL/min — ABNORMAL LOW (ref >60)
GFR calc non Af Amer: 12 mL/min — ABNORMAL LOW (ref >60)
Glucose, Bld: 170 mg/dL — ABNORMAL HIGH (ref 70–99)
Phosphorus: 4.9 mg/dL — ABNORMAL HIGH (ref 2.5–4.6)
Potassium: 4.6 mmol/L (ref 3.5–5.1)
Sodium: 138 mmol/L (ref 135–145)

## 2020-03-12 LAB — CBC WITH DIFFERENTIAL/PLATELET
Abs Immature Granulocytes: 0.18 10*3/uL — ABNORMAL HIGH (ref 0.00–0.07)
Basophils Absolute: 0.1 10*3/uL (ref 0.0–0.1)
Basophils Relative: 0 %
Eosinophils Absolute: 0.6 10*3/uL — ABNORMAL HIGH (ref 0.0–0.5)
Eosinophils Relative: 3 %
HCT: 31.8 % — ABNORMAL LOW (ref 39.0–52.0)
Hemoglobin: 9.8 g/dL — ABNORMAL LOW (ref 13.0–17.0)
Immature Granulocytes: 1 %
Lymphocytes Relative: 14 %
Lymphs Abs: 2.7 10*3/uL (ref 0.7–4.0)
MCH: 25.7 pg — ABNORMAL LOW (ref 26.0–34.0)
MCHC: 30.8 g/dL (ref 30.0–36.0)
MCV: 83.2 fL (ref 80.0–100.0)
Monocytes Absolute: 1.7 10*3/uL — ABNORMAL HIGH (ref 0.1–1.0)
Monocytes Relative: 8 %
Neutro Abs: 15 10*3/uL — ABNORMAL HIGH (ref 1.7–7.7)
Neutrophils Relative %: 74 %
Platelets: 264 10*3/uL (ref 150–400)
RBC: 3.82 MIL/uL — ABNORMAL LOW (ref 4.22–5.81)
RDW: 19.2 % — ABNORMAL HIGH (ref 11.5–15.5)
WBC: 20.3 10*3/uL — ABNORMAL HIGH (ref 4.0–10.5)
nRBC: 0 % (ref 0.0–0.2)

## 2020-03-12 LAB — HEPATITIS B SURFACE ANTIGEN: Hepatitis B Surface Ag: NONREACTIVE

## 2020-03-12 LAB — GLUCOSE, CAPILLARY
Glucose-Capillary: 173 mg/dL — ABNORMAL HIGH (ref 70–99)
Glucose-Capillary: 196 mg/dL — ABNORMAL HIGH (ref 70–99)
Glucose-Capillary: 147 mg/dL — ABNORMAL HIGH (ref 70–99)
Glucose-Capillary: 123 mg/dL — ABNORMAL HIGH (ref 70–99)

## 2020-03-12 MED ORDER — HEPARIN SODIUM (PORCINE) 1000 UNIT/ML DIALYSIS: 2000.0000 [IU] | INTRAMUSCULAR | Status: DC | PRN

## 2020-03-12 MED ORDER — HEPARIN SODIUM (PORCINE) 1000 UNIT/ML IJ SOLN
INTRAMUSCULAR | Status: AC
  Administered 2020-03-12: 1000 [IU] via INTRAVENOUS_CENTRAL
  Filled 2020-03-12: qty 4

## 2020-03-12 MED ORDER — VANCOMYCIN HCL IN DEXTROSE 500-5 MG/100ML-% IV SOLN
INTRAVENOUS | Status: AC
  Administered 2020-03-12: 500 mg via INTRAVENOUS
  Filled 2020-03-12: qty 100

## 2020-03-12 NOTE — Progress Notes (Signed)
Pt transferred to 2W22. Beverline Amisial,RN gave report to Phyllis,RN. [name] sister made aware via phone call that pt transferred to another unit.

## 2020-03-12 NOTE — Progress Notes (Addendum)
Nutrition Follow-up  DOCUMENTATION CODES:   Underweight, Severe malnutrition in context of chronic illness  INTERVENTION:   - Continue Nepro at 50 ml/hr via PEG(1200 ml per day) - Pro-stat 30 ml BID  Provides 2360 kcal, 127 grams of protein, 872 ml free water daily.  -Continue Juven BID per tube, each packet provides 80 calories, 8 grams of carbohydrate, 2.5 grams of protein, 7 grams of L-arginine and 7 grams of L-glutamine; supplement contains CaHMB, Vitamins C, E, B12 and zinc to promote wound healing.  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  Ongoing  GOAL:   Patient will meet greater than or equal to 90% of their needs  Met with TF  MONITOR:   Diet advancement, Labs, Weight trends, TF tolerance, Skin, I & O's  REASON FOR ASSESSMENT:   Ventilator, Other (Comment) (hx PEG)    ASSESSMENT:   74 yo male admitted with hemoptysis. 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  6/02 - s/p new [facility] 6/07 - trach collar  Reviewed I/O's: +1.1 L x 24 hours and +8.7 L since 02/27/20  UOP: 50 ml x 24 hours  Pt down at HD at time of visit.   Pt remains NPO; SLP continues with PSMV trials.   Pt remains dependent on TF via PEG; currently receiving Nepro @ 50 ml/hr, 30 ml Prostat BID and Juven BID. Complete TF regimen provides  2520 kcals, 143 grams protein and 872 ml free water daily,meeting 100% of estimated kcal and protein needs.   Per chart review, palliative care has signed off as pt and family continues to desire aggressive care.  Labs reviewed: CBGS: 156-183 (inpatient orders for glycemic control are 0-6 units insulin aspart every 4 hours and 2 units insulin aspart every 4 hours).   Diet Order:   Diet Order            Diet NPO time specified  Diet  effective now                 EDUCATION NEEDS:   No education needs have been identified at this time  Skin:  Skin Assessment: Skin Integrity Issues: Skin Integrity Issues:: Stage II, Incisions DTI: - Stage II: head Stage III: - Stage IV: sacrum Incisions: skin tear lt leg; MASD rt and lt buttocks  Last BM:  03/12/20 (325 ml output via colostomy)  Height:   Ht Readings from Last 1 Encounters:  02/14/20 _0  (1.753 m)    Weight:   Wt Readings from Last 1 Encounters:  03/12/20 58.5 kg    Ideal Body Weight:  72.7 kg  BMI:  Body mass index is 19.05 kg/m.  Estimated Nutritional Needs:   Kcal:  2200-2400  Protein:  110-135 grams  Fluid:  1 L + UOP    Lakysha Kossman W, RD, LDN, CDCES Registered Dietitian II Certified Diabetes Care and Education Specialist Please refer to AMION for RD and/or RD on-call/weekend/after hours pager

## 2020-03-12 NOTE — Progress Notes (Signed)
Orthopedic Tech Progress Note Patient Details:  [name] 1946-01-09 [id number] Called in bilateral resting hand splints.  Patient ID: Shane Banks, male   DOB: 1946-06-28, 74 y.o.   MRN: [id number]   [name] 03/12/2020, 6:02 PM

## 2020-03-12 NOTE — Progress Notes (Signed)
Occupational Therapy Treatment Patient Details Name: Shane Banks MRN: [id number] DOB: [birth year] Today's Date: 03/12/2020    History of present illness 74 yo m with PMHx of afib with RVR, BPH, stroke, and COVID ARDS in January requiring trach and LTACH placement at [facility]. Had been tolerating trach collar, but on 5/19 developed hemoptysis and hypoxemic respiratory failure requiring vent. Transferred to [facility] on 5/19.  5/19 back on vent, 6/7 transitioned to [facility]   OT comments  Pt tolerating PROM to BUEs; minimal elbow flexion in RUE and near "stuck" in elbow extension; Pt eyes locked looking in R superior corner of both eyes and unable to get eyes to midline. Pt occasionally shaking head to allow for eye movement. Pt requires resting hand splints for BUEs as pt's palms require protection and otherwise, pt remains with fisted hands- wet and sticky from poor sanitation/excessive sweat. Pt not tolerating cervical rotation well. Pt would greatly benefit from continued OT skilled services. OT following acutely. OT to order resting hand splints once okayed by MD.  Bed level - HR 106-126 throughout exercises.    Follow Up Recommendations  SNF;LTACH;Supervision/Assistance - 24 hour    Equipment Recommendations  Other (comment) (defer to next facility)    Recommendations for Other Services      Precautions / Restrictions Precautions Precautions: Fall Precaution Comments: trach collar Restrictions Weight Bearing Restrictions: No       Mobility Bed Mobility Overal bed mobility: Needs Assistance             General bed mobility comments: totalA for repositioning; HR stayed increased 106 BPM to 126 BPM with BUE HEP  Transfers Overall transfer level: Needs assistance               General transfer comment: deferred due to high HR and rigidity    Balance                                           ADL either performed or assessed with clinical  judgement   ADL Overall ADL's : Needs assistance/impaired                                     Functional mobility during ADLs: Total assistance General ADL Comments: total A all ADL tasks     Vision   Vision Assessment?: Vision impaired- to be further tested in functional context;Yes Additional Comments: poor command follow- pt blinks R eye with movement in front of it; L eye does not seem affected by sound or movement in eye   Perception     Praxis      Cognition Arousal/Alertness: Lethargic Behavior During Therapy: Flat affect Overall Cognitive Status: Difficult to assess                                 General Comments: Pt not following commands; pt with R gaze preference and difficulty looking past midline to L with either eye        Exercises Exercises: Other exercises Other Exercises Other Exercises: PROM to BUEs; evaluated for resting hand splints Other Exercises: cervical rotation   Shoulder Instructions       General Comments HR 106-126 BPM with littel exertion    Pertinent Vitals/  Pain       Pain Assessment: Faces Faces Pain Scale: Hurts even more Pain Location: R shoulder elbow, B ankles, B hands Pain Descriptors / Indicators: Discomfort;Sore Pain Intervention(s): Monitored during session  Home Living                                          Prior Functioning/Environment              Frequency  Min 2X/week        Progress Toward Goals  OT Goals(current goals can now be found in the care plan section)  Progress towards OT goals: Progressing toward goals  Acute Rehab OT Goals Patient Stated Goal: pt unable to participate OT Goal Formulation: Patient unable to participate in goal setting Time For Goal Achievement: 03/21/20 Potential to Achieve Goals: Fair ADL Goals Pt Will Perform Upper Body Bathing: with max assist;bed level Additional ADL Goal #1: Demonstrate increased of R shoulder  P/AAROM to 75 degrees after using mobilization techniques to increase functional use RUE Additional ADL Goal #2: Pt will complete hand to mouth pattern with mod A using L UE inpreparation for ADL tasks Additional ADL Goal #3: pt will tolerate bilateral resting hand splints 4 hours on/off schedule to maintain a stretch in hands and increase functional use.  Plan Discharge plan remains appropriate    Co-evaluation                 AM-PAC OT "6 Clicks" Daily Activity     Outcome Measure   Help from another person eating meals?: Total Help from another person taking care of personal grooming?: Total Help from another person toileting, which includes using toliet, bedpan, or urinal?: Total Help from another person bathing (including washing, rinsing, drying)?: Total Help from another person to put on and taking off regular upper body clothing?: Total Help from another person to put on and taking off regular lower body clothing?: Total 6 Click Score: 6    End of Session Equipment Utilized During Treatment: Oxygen  OT Visit Diagnosis: Unsteadiness on feet (R26.81);Other abnormalities of gait and mobility (R26.89);Muscle weakness (generalized) (M62.81);Other symptoms and signs involving cognitive function;Pain Pain - Right/Left: Right Pain - part of body: Shoulder;Arm;Hand;Hip;Knee;Leg   Activity Tolerance Treatment limited secondary to medical complications (Comment)   Patient Left in bed;with call bell/phone within reach;with bed alarm set   Nurse Communication Mobility status        Time: 4401-0272 OT Time Calculation (min): 16 min  Charges: OT General Charges $OT Visit: 1 Visit OT Treatments $Therapeutic Exercise: 8-22 mins  Jefferey Pica, OTR/L Acute Rehabilitation Services Pager: [phone] Office: [phone]    Jefferey Pica 03/12/2020, 4:06 PM

## 2020-03-12 NOTE — Progress Notes (Signed)
PT Cancellation Note  Patient Details Name: Dontrez Pettis MRN: [id number] DOB: 1945-11-04   Cancelled Treatment:    Reason Eval/Treat Not Completed: (P) Patient at procedure or test/unavailable (Pt off unit for HD.  Will f/u per POC.)   Kissie Ziolkowski Eli Hose 03/12/2020, 9:39 AM  Erasmo Leventhal , PTA Acute Rehabilitation Services Pager [phone] Office [phone]

## 2020-03-12 NOTE — Progress Notes (Signed)
Was asked to come and clarify decision regarding transfer of this patient to ICU vs progressive for intermittent tachypnea. Seems he improves with some suctioning.   He is intermittently tachypneic now, but only when I wake him up.  He intermittently has a wet cough.  Satting well on trach collar.   Arousable.    CXR shows infiltrates, unchanged.  I think he will be well served on progressive for closer monitoring and more frequent suctioning.   Would benefit from CPT, suctioning q4, more aggressive pulmonary hygeine.    Collier Bullock

## 2020-03-12 NOTE — Progress Notes (Addendum)
[facility] Progress Note  Subjective: pt seen on HD, more alert today  Vitals:   03/12/20 0900 03/12/20 0930 03/12/20 1000 03/12/20 1030  BP: (!) 93/55 (!) 93/50 (!) 78/58 (!) 110/50  Pulse: (!) 58 (!) 58 (!) 117 (!) 57  Resp:      Temp:      TempSrc:      SpO2:      Weight:      Height:        Exam:   on HD, in bed, alert, eyes open  no jvd  Chest cta bilat  Cor reg no RG  Abd soft ntnd no ascites, +llq ostomy, +peg tube   Ext no LE edema    Can mouth yes or no to some simple questions         Summary: came from [facility] to [facility] after COVID hospitalization which included mech ventilation and trach placement. At [facility] had severe infections/ bacteremia/ PNA and developed AKI felt due to IV gent toxicity requiring initiation of HD 12/16/19. Admitted to ICU [facility] for trach bleed on 5/19. Was getting MWF HD at [facility] prior to admit here.   OP HD: started March 2021 at [facility] as above MWF   Getting HD here 3.5- 4h, UF 0.5- 1.5 L, hep 2000 prn   admit 62.8kg > range 56.9- 64.7kg here  Assessment/ Plan: 1. 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 On IV abx. Get volume down as tol w/ HD. Doing fairly well on TC now though RR stays in 30's.  2. Enterococcus/ MRSE bacteremia HD cath sepsis - TDC removed 5/26, per ID getting 6 wks vanc/ fortaz/ flagyl for polymicrobial sepsis and sacral osteo.  3. Sacral osteo/ decub stage IV - IV abx as above per ID 4. ESRD/ AKI - on HD since mid March 2021, d/t gent toxicity. HD MWF at [facility]. S/P new TDC 6/2 by IR. HD today upstairs.    5. Trach bleed - resolved 6. Tracheobronchitis - sp course of doxycycline 7. AMS - variable, seems overall improved 8. Atrial fib - per primary, on eliquis 2.5 BID 9. Anemia ckd - transfuse prn, Hb 9.6. 10. Hypercalcemia:  w/ low PTH, prob from immobility, corrCa 11.0 > 10.5>10.3, improved. Cont low 2.0 Ca++ bath , PTH 29. If recurs will  consider pamidronate or similar.  11. BP/vol - no sig vol excess now, 5kg down from admit wt, looks dry on exam, BP not tolerating even small UF w/ HD today will keep even 12. SP peg/ divert colostomy 13. SP COVID pna - original problem in Feb at OSH 14. DNR/ poor prognosis-  LTACH only option for disposition given needs but apparently not a candidate. Family asked not to be contacted by PMT anymore, they have now signed off.  For now cont plan of care.    Rob  03/12/2020, 11:17 AM   Recent Labs  Lab 03/11/20 0236 03/12/20 0427 03/12/20 0728  K 4.2  --  4.6  BUN 127*  --  188*  CREATININE 3.28*  --  4.44*  CALCIUM 10.1  --  10.6*  PHOS  --   --  4.9*  HGB 9.5* 9.8*  --    Inpatient medications: . apixaban  2.5 mg Per Tube BID  . chlorhexidine gluconate (MEDLINE KIT)  15 mL Mouth Rinse BID  . Chlorhexidine Gluconate Cloth  6 each Topical Q0600  . Chlorhexidine Gluconate Cloth    6 each Topical Q0600  . feeding supplement (PRO-STAT SUGAR FREE 64)  30 mL Per Tube BID  . insulin aspart  0-6 Units Subcutaneous Q4H  . insulin aspart  2 Units Subcutaneous Q4H  . ipratropium-albuterol  3 mL Nebulization QID  . levothyroxine  25 mcg Per Tube Q0600  . mouth rinse  15 mL Mouth Rinse 10 times per day  . metoprolol tartrate  12.5 mg Per Tube BID  . metroNIDAZOLE  500 mg Per Tube Q8H  . nutrition supplement (JUVEN)  1 packet Per Tube BID BM  . pantoprazole sodium  40 mg Per Tube Daily  . vancomycin  500 mg Intravenous Q T,Th,Sa-HD   . sodium chloride    . sodium chloride    . sodium chloride Stopped (03/09/20 1302)  . cefTAZidime (FORTAZ)  IV Stopped (03/09/20 1844)  . feeding supplement (NEPRO CARB STEADY) 1,000 mL (03/10/20 1956)   sodium chloride, sodium chloride, sodium chloride, acetaminophen (TYLENOL) oral liquid 160 mg/5 mL, alteplase, docusate, fentaNYL (SUBLIMAZE) injection, heparin, heparin, heparin, heparin, [START ON 03/13/2020] heparin, lidocaine (PF),  lidocaine-prilocaine, midazolam, midazolam, pentafluoroprop-tetrafluoroeth, polyethylene glycol

## 2020-03-12 NOTE — Progress Notes (Signed)
Physical Therapy Treatment Patient Details Name: Shane Banks MRN: [id number] DOB: 11-30-1945 Today's Date: 03/12/2020    History of Present Illness 74 yo m with PMHx of afib with RVR, BPH, stroke, and COVID ARDS in January requiring trach and LTACH placement at [facility]. Had been tolerating trach collar, but on 5/19 developed hemoptysis and hypoxemic respiratory failure requiring vent. Transferred to [facility] on 5/19.  5/19 back on vent, 6/7 transitioned to [facility]    PT Comments    Pt supine in bed on arrival in extensor tone with gaze to the R.  Pt with elevated resting HR and not following commands.  Focus of session on positioning to avoid further contractures.  Pt is not making active progress towards goals but continues to benefit from snf placement for positioning and OOB transfers when appropriate.  Continue to recommend post acute rehab.      Follow Up Recommendations  SNF;LTACH;Supervision/Assistance - 24 hour     Equipment Recommendations  Other (comment) (TBA)    Recommendations for Other Services       Precautions / Restrictions Precautions Precautions: Fall Restrictions Weight Bearing Restrictions: No    Mobility  Bed Mobility Overal bed mobility: Needs Assistance             General bed mobility comments: Total assistance for positioning.  On arrival HR elevated to 128 bpm, required metoprololo.  He presents with strong gaze to the R, and strong extensor tone on R UE.  Pt positioned into B dorsiflexion with prevalon boots, knee flexion with bed positioning, hip flexion and elevation of B UE.  Placed wash cloth rolls in B hands as his palms were macerated from moisture.  Positioned pillow folder to turn head back to midline.  Deferred further mobility due to HR.  Transfers                    Ambulation/Gait                 Stairs             Wheelchair Mobility    Modified Rankin (Stroke Patients Only)       Balance                                             Cognition Arousal/Alertness: Lethargic Behavior During Therapy: Flat affect Overall Cognitive Status: Difficult to assess                                 General Comments: Pt not following commands this session.  he has strong gaze to the L side of the room.  RN reports he was more alert last session.      Exercises      General Comments        Pertinent Vitals/Pain Pain Assessment: Faces Faces Pain Scale: Hurts even more Pain Location: R shoulder elbow, B ankles, B hands Pain Descriptors / Indicators: Tightness;Sore;Grimacing;Guarding;Discomfort Pain Intervention(s): Monitored during session;Repositioned    Home Living                      Prior Function            PT Goals (current goals can now be found in the care plan section) Acute Rehab PT Goals  PT Goal Formulation: With patient Potential to Achieve Goals: Fair Progress towards PT goals: Not progressing toward goals - comment (Pt not following commands this session and HR remains elevated.)    Frequency    Min 2X/week      PT Plan Current plan remains appropriate    Co-evaluation              AM-PAC PT "6 Clicks" Mobility   Outcome Measure  Help needed turning from your back to your side while in a flat bed without using bedrails?: Total Help needed moving from lying on your back to sitting on the side of a flat bed without using bedrails?: Total Help needed moving to and from a bed to a chair (including a wheelchair)?: Total Help needed standing up from a chair using your arms (e.g., wheelchair or bedside chair)?: Total Help needed to walk in hospital room?: Total Help needed climbing 3-5 steps with a railing? : Total 6 Click Score: 6    End of Session Equipment Utilized During Treatment: Oxygen Activity Tolerance: Patient tolerated treatment well Patient left: in bed;with call bell/phone within reach;with  nursing/sitter in room;with bed alarm set Nurse Communication: Mobility status (macerated palms) PT Visit Diagnosis: Muscle weakness (generalized) (M62.81);Adult, failure to thrive (R62.7);Other abnormalities of gait and mobility (R26.89)     Time: 7482-7078 PT Time Calculation (min) (ACUTE ONLY): 19 min  Charges:  $Therapeutic Activity: 8-22 mins                     Shane Banks , PTA Acute Rehabilitation Services Pager [phone] Office [phone]     Shane Banks 03/12/2020, 1:40 PM

## 2020-03-12 NOTE — Progress Notes (Signed)
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  Patient had recent history of Covid pneumonia and was on ventilator because he progressed to ARDS.  Because of little improvement in the ventilatory and oxygenation, he underwent tracheostomy and was discharged to [facility].  He had recent history of UTI, multidrug-resistant Pseudomonas pneumonia, stenotrophomonas bacteremia.  Because of sepsis and shock, he developed renal failure requiring dialysis.  On 5/19, he developed hemoptysis and was transferred to [facility] ED for evaluation.  PCCM,ID,Nephrology following here.  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  He also underwent tracheostomy tube change by PCCM for persistent leak.  Due to lack of insurance, he cannot go back to [facility].  Social worker assisting on placement.  Anticipate prolonged hospitalization.  1/8  Admit to [facility] for COVID PNA 2/23 Transfer to [facility] on vent 5/19 Transfer to [facility] for hemoptysis. #6 cuffed trach placed. Back on vent. Bronch performed 5/20 Transitioned to trach collar.  5/22 back on vent full time 5/24 back on antibiotics for fevers 6/1 Initiating PSV weans 6/3 Large cuff leak, trach exchange 6/7 transitioned to [facility] 6/9 remains on TC continuously since 6/7  Consultants:   palliative ENT Nephrology ID PCCM  Procedures: ENT flex scope 5/19 > no bleeding source identified FOB 5/19 >no pulmonary bleeding source identified. 6/2: Tunnel catheter placement by IR. 6/4: Changed trach tube  Antimicrobials:   Metronidazole, vancomycin, [name]   Subjective: In dialysis this a.m.   On trach collar..bp on low side. Pt nodes no to questions of cp, worsening sob, dizzines.s Objective: Vitals:   03/12/20 1220 03/12/20 1447 03/12/20 1533 03/12/20 1601  BP:  (!) 95/57 101/60   Pulse:  81 82   Resp:  (!) 45 (!) 38   Temp:  97.9 F (36.6 C) 98 F (36.7 C)   TempSrc:  Axillary Axillary   SpO2: 91%  98% 97%  Weight:      Height:        Intake/Output Summary (Last 24 hours) at 03/12/2020 1705 Last data filed at 03/12/2020 1112 Gross per 24 hour  Intake 766.67 ml  Output 36 ml  Net 730.67 ml   Filed Weights   03/12/20 0500 03/12/20 0700 03/12/20 1112  Weight: 61.2 kg 57.6 kg 58.5 kg    Examination:  General exam: Appears calm and comfortable , on trach.  In HD Respiratory system: anteriorly Clear to auscultation , no r/w/r, poor respiratory effort Cardiovascular system: S1 & S2 heard, RRR. No JVD, murmurs, rubs, gallops or clicks.  Gastrointestinal system: Abdomen is nondistended, soft and nontender. +bs+ostomy ,+peg in place Central nervous system: Grossly appears intact, awake, alert Extremities: no edema or cyanosis Skin: warm, dry Psych: Mood appropriate for current setting    Data Reviewed: I have personally reviewed following labs and imaging studies  CBC: Recent Labs  Lab 03/08/20 0530 03/09/20 0334 03/10/20 0307 03/11/20 0236 03/12/20 0427  WBC 25.0* 22.2* 25.4* 21.1* 20.3*  NEUTROABS 18.8* 16.5* 19.2* 15.2* 15.0*  HGB 9.5* 9.6* 9.9* 9.5* 9.8*  HCT 31.1* 30.8* 32.4* 30.9* 31.8*  MCV 83.8 82.8 83.7 84.0 83.2  PLT 342 311 260 272 102   Basic Metabolic Panel: Recent Labs  Lab 03/06/20 0249 03/07/20 0307 03/09/20 1110 03/11/20 0236 03/12/20 0728  NA 137 140 138 138 138  K 3.8 3.8 3.9 4.2 4.6  CL 99 102 99 98 97*  CO2 26 26 26 29 26   GLUCOSE 137* 132* 154* 136* 170*  BUN 98* 147* 150* 127* 188*  CREATININE 2.88* 3.90* 3.89* 3.28* 4.44*  CALCIUM 9.9 10.4* 10.3 10.1 10.6*  PHOS  --   --   --   --  4.9*   GFR: Estimated Creatinine  Clearance: 12.4 mL/min (A) (by C-G formula based on SCr of 4.44 mg/dL (H)). Liver Function Tests: Recent Labs  Lab 03/12/20 0728  ALBUMIN 2.1*   No results for input(s): LIPASE, AMYLASE in the last 168 hours. No results for input(s): AMMONIA in the last 168 hours. Coagulation Profile: No results for input(s): INR, PROTIME in the last 168 hours. Cardiac Enzymes: No results for input(s): CKTOTAL, CKMB, CKMBINDEX, TROPONINI in the last 168 hours. BNP (last 3 results) No results for input(s): PROBNP in the last 8760 hours. HbA1C: No results for input(s): HGBA1C in the last 72 hours. CBG: Recent Labs  Lab 03/11/20 1942 03/11/20 2351 03/12/20 0327 03/12/20 1157 03/12/20 1659  GLUCAP 156* 183* 173* 196* 147*   Lipid Profile: No results for input(s): CHOL, HDL, LDLCALC, TRIG, CHOLHDL, LDLDIRECT in the last 72 hours. Thyroid Function Tests: No results for input(s): TSH, T4TOTAL, FREET4, T3FREE, THYROIDAB in the last 72 hours. Anemia Panel: No results for input(s): VITAMINB12, FOLATE, FERRITIN, TIBC, IRON, RETICCTPCT in the last 72 hours. Sepsis Labs: No results for input(s): PROCALCITON, LATICACIDVEN in the last 168 hours.  Recent Results (from the past 240 hour(s))  Culture, blood (routine x 2)     Status: None   Collection Time: 03/03/20  9:13 AM   Specimen: BLOOD RIGHT HAND  Result Value Ref Range Status   Specimen Description BLOOD RIGHT HAND  Final   Special Requests   Final    BOTTLES DRAWN AEROBIC ONLY Blood Culture adequate volume   Culture   Final    NO GROWTH 5 DAYS Performed at [facility], [address], [place], [place] [address]    Report Status 03/08/2020 FINAL  Final  Culture, blood (routine x 2)     Status: None   Collection Time: 03/03/20  9:13 AM   Specimen: BLOOD LEFT HAND  Result Value Ref Range Status   Specimen Description BLOOD LEFT HAND  Final   Special Requests   Final    BOTTLES DRAWN AEROBIC ONLY Blood Culture adequate volume    Culture   Final    NO GROWTH 5 DAYS Performed at [facility], [place] [address] [place], [place] [address]    Report Status 03/08/2020 FINAL  Final         Radiology Studies: DG Chest Port 1 View  Result Date: 03/11/2020 CLINICAL DATA:  Respiratory failure EXAM: PORTABLE CHEST 1 VIEW COMPARISON:  03/03/2020 FINDINGS: Asymmetric reticular and airspace disease on the left with volume loss. Mild improvement in right lung aeration. Tracheostomy tube in place. Dialysis catheter with tip at the right atrium. No visible effusion or pneumothorax IMPRESSION: Left more than right airspace disease with mild clearing on the right since prior. No new abnormality. Electronically Signed   By: Monte Fantasia M.D.   On: 03/11/2020 07:55        Scheduled Meds: . apixaban  2.5 mg Per Tube BID  . chlorhexidine gluconate (MEDLINE KIT)  15 mL  Mouth Rinse BID  . Chlorhexidine Gluconate Cloth  6 each Topical Q0600  . Chlorhexidine Gluconate Cloth  6 each Topical Q0600  . feeding supplement (PRO-STAT SUGAR FREE 64)  30 mL Per Tube BID  . insulin aspart  0-6 Units Subcutaneous Q4H  . insulin aspart  2 Units Subcutaneous Q4H  . ipratropium-albuterol  3 mL Nebulization QID  . levothyroxine  25 mcg Per Tube Q0600  . mouth rinse  15 mL Mouth Rinse 10 times per day  . metoprolol tartrate  12.5 mg Per Tube BID  . metroNIDAZOLE  500 mg Per Tube Q8H  . nutrition supplement (JUVEN)  1 packet Per Tube BID BM  . pantoprazole sodium  40 mg Per Tube Daily  . vancomycin  500 mg Intravenous Q T,Th,Sa-HD   Continuous Infusions: . sodium chloride Stopped (03/09/20 1302)  . cefTAZidime (FORTAZ)  IV Stopped (03/09/20 1844)  . 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  Status post trach due to respiratory failure from Covid pneumonia.  He is on trach collar. PCCM following.  Most recently pneumonia has been multidrug-resistant Pseudomonas for which she was treated with Avycaz and mycin nebulizers at [facility]. Possible ongoing aspiration also.   Presented with hemoptysis, aspiration of blood.  Patient has cuffed trach placed by ENT.  No active bleeding on bronchoscopy.     He also underwent tracheostomy tube change by PCCM for persistent leak on 03/01/20. Chest x-ray done on 03/03/2020 showed bilateral pleural effusions, stable extensive opacity throughout the left hemithorax. Currently he is on trach collar, requiring 7 L of oxygen per minute.. Per PCCm, trach collar may be destination for pt. duonebs +CPT Continue abx Volume removal by HD   Paroxysmal A. fib: Currently rate is controlled.  Anticoagulation was discontinued due to suspected GI bleed.   currently on low-dose Eliquis.   added low dose toprol xl 12.56m qd , Hr better rate control.  Sepsis/bacteremia: Enterococcus and Staphylococcus epidermidis bacteremia HD cath sepsis TDC removed 5/26,  Also has osteomyelitis fifth sacral segment.   High suspicion for ongoing aspiration.  MRI pelvis/sacrum showed osteomyelitis of fifth sacral segment, edema in the muscle around both hips and proximal thighs with possible myositis.   ID had recommended treatment for 6 weeks for sacral osteomyelitis and staph epidermidis bacteremia with vancomycin, fortaz and metronidazole. IR consulted for bone biopsy but due to high risk  it was not done. He has persistent  leukocytosis.  Continue to monitor CBC.   ESRD: on HD  since mid March 2021, d/t gent toxicity.  [facility] Tunnel catheter placement on  6/2 by IR.   HD today     Chronic normocytic anemia : Secondary to ESRD, critical illness.  Has been transfused with PRBCs.  Currently hemoglobin stable.  Continue to monitor  Unstageable sacral pressure ulcer/osteomyelitis: Wound care following.  Discussion as above.  Cachexia/severe protein malnutrition: On tube feeds.  Nutrition following.  Mild elevated LFTs:  Continue to monitor.  Disposition: Patient is from [place].  Anticipate discharge to LTAC/SNF but due to lack of insurance, placement has been difficult.  Social working Sports administrator.  Goals of care: DNR.  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  We have requested for palliative  care evaluation.plz see note-trying to have discussion with pt . Per palliative-family had a meeting and that they wish to continue all aggressive care, including vent and dialysis.  Per palliative care on 6/14 family is not interested in meeting with them anymore and have no interest in de-escalating care.  Palliative care has signed off.  Pressure Injury 02/14/20 Sacrum Medial Stage 4 - Full thickness tissue loss with exposed bone, tendon or muscle. (Active)  02/14/20 1906  Location: Sacrum  Location Orientation: Medial  Staging: Stage 4 - Full thickness tissue loss with exposed bone, tendon or muscle.  Wound Description (Comments):   Present on Admission: Yes     Pressure Injury 03/03/20 Neck Anterior Stage 3 -  Full thickness tissue loss. Subcutaneous fat may be visible but bone, tendon or muscle are NOT exposed. Device related injury under trach tube.  (Active)  03/03/20 1805  Location: Neck  Location Orientation: Anterior  Staging: Stage 3 -  Full thickness tissue loss. Subcutaneous fat may be visible but bone, tendon or muscle are NOT exposed.  Wound Description  (Comments): Device related injury under trach tube.   Present on Admission: No   Nutrition Problem: Severe Malnutrition Etiology: chronic illness(chronic respiratory failure related to COVID ARDS)  DVT prophylaxis: Eliquis Code Status: DNR Family Communication: None at bedside Disposition Plan:  Status is: Inpatient  Remains inpatient appropriate because:Unsafe d/c plan   Dispo: The patient is from: [facility]  Anticipated d/c is to: SNF/LTAC  Anticipated d/c date is: Unknown     LOS: 27 days   Time spent:45 min with >50% on coc    Nolberto Hanlon, MD [facility] Pager [phone]  If 7PM-7AM, please contact night-coverage [email or web address] Password [facility] 03/12/2020, 5:05 PM

## 2020-03-12 NOTE — Progress Notes (Signed)
NAME:  Shane Banks, MRN:  [id number], DOB:  03/06/1946, LOS: 88 ADMISSION DATE:  02/14/2020, CONSULTATION DATE:  5/19 REFERRING MD:  Dr. Alvino Chapel, CHIEF COMPLAINT:  Hemoptysis   Brief History   74 yo m with PMHx of afib with RVR, BPH, stroke, and COVID ARDS in January requiring trach and LTACH placement at [facility]. Had been tolerating trach collar, but on 5/19 developed hemoptysis and hypoxemic respiratory failure requiring vent. Transferred to [facility] on 5/19.  Past Medical History   has a past medical history of Acute on chronic respiratory failure with hypoxia ([place]), Atrial fibrillation with RVR ([place]), BPH (benign prostatic hyperplasia), COVID-19 virus infection, Pneumonia due to COVID-19 virus, Severe sepsis ([place]), and Stroke ([place]).  Significant Hospital Events   1/8  Admit to [facility] for COVID PNA 2/23 Transfer to [facility] on vent 5/19 Transfer to [facility] for hemoptysis. #6 cuffed trach placed. Back on vent. Bronch performed 5/20 Transitioned to trach collar.  5/22 back on vent full time 5/24 back on antibiotics for fevers 6/1 Initiating PSV weans 6/3 Large cuff leak, trach exchange 6/7 transitioned to [facility] 6/9 remains on TC continuously since 6/7  Consults:  ENT Nephrology ID PCCM  Procedures:  ENT flex scope 5/19 > no bleeding source identified FOB 5/19 > no pulmonary bleeding source identified.   Significant Diagnostic Tests:  CXR 5/28 >> R CVC placement confirmed Slightly improved aeration at the right base. 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 Edema in the muscles around the hips and in the posterior paraspinal musculature of the lower lumbar spine and in the buttocks which could represent myositis. Fluid-fluid level in the bladder may represent protein or debris in  the bladder. The possibility of urinary tract infection should be considered.  CXR 6/3> improved R lung aeration, worsening L sided opacity. Tracheostomy tube, HD catheter.   CXR 6/4>wosened R lung infiltrates and stable L sided infiltrate. Possible bilateral pleural effusions   Micro Data:    tracheal aspirate 5/7> for Pseudomonas A  BAL 5/19 > no growth 5/19 blood > NG 5/22 trach aspirate>> candida parasipolis 5/22 blood>> 1/4 GPC> staph epi 5/25 blood>> GPC (enterococcus on biofire) 2/4 bottles>> E. Faecalis, staph epi. 5/25 trach aspirate>>many PMN, few GPR, rare GPC> diphtheroids 5/25 blood cx>> + for Enterococcus Faecalis (gent resistant)/ Staphylococcus Epidermidis (tetracycline, vanc, rifampin sensitive) 5/27 BCx> ngtd    Antimicrobials:  Levofloxacin 5/24>5/26 vanc 5/25> Ceftriaxone  5/27>6/1 Metronidazole 5/27>  [name] 6/1>  Interim history/subjective:  Called back today for tachypnea. Had been more alert earlier on HD. RT did not feel he required suctioning earlier today.   Objective   Blood pressure 97/66, pulse 77, temperature 98.3 F (36.8 C), temperature source Axillary, resp. rate (!) 45, height _0  (1.753 m), weight 58.5 kg, SpO2 99 %.    FiO2 (%):  [28 %-40 %] 40 %   Intake/Output Summary (Last 24 hours) at 03/12/2020 1805 Last data filed at 03/12/2020 1112 Gross per 24 hour  Intake 766.67 ml  Output 36 ml  Net 730.67 ml   Filed Weights   03/12/20 0500 03/12/20 0700 03/12/20 1112  Weight: 61.2 kg 57.6 kg 58.5 kg    Examination:   GEN: chronically ill man on vent HEENT: trach in place, copious clear secretions on suctioning. 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  - Continue trach collar - Continue working with PMV - Secretions and overall frailty currently would prevent decannulation.  - Suctioned today with improvement in tachypnea.  Will review chest XR and reassess prior to bringing to ICU.  Other issues per primary Enterococcus/ MRSE bacteremia HD cath sepsis  Sacral osteo/ decub stage IV  Afib S/p PEG, diverting ostomy  Kipp Brood, MD [place] ICU Physician [place]  Pager: [phone] Mobile: [phone] After hours: [phone].  03/12/2020, 6:05 PM

## 2020-03-12 NOTE — Progress Notes (Signed)
Patient transferred to 2w22. Bed low and locked. Will continue to monitor.

## 2020-03-12 NOTE — Progress Notes (Signed)
   03/12/20 1447  Assess: MEWS Score  Temp 97.9 F (36.6 C)  BP (!) 95/57  Pulse Rate 81  Resp (!) 45  Assess: MEWS Score  MEWS Temp 0  MEWS Systolic 1  MEWS Pulse 0  MEWS RR 3  MEWS LOC 0  MEWS Score 4  MEWS Score Color Red  Assess: if the MEWS score is Yellow or Red  Were vital signs taken at a resting state? Yes  Focused Assessment Documented focused assessment  Early Detection of Sepsis Score *See Row Information* Medium  MEWS guidelines implemented *See Row Information* Yes  Treat  MEWS Interventions Administered prn meds/treatments  Take Vital Signs  Increase Vital Sign Frequency  Red: Q 1hr X 4 then Q 4hr X 4, if remains red, continue Q 4hrs  Escalate  MEWS: Escalate Red: discuss with charge nurse/RN and provider, consider discussing with RRT  Notify: Charge Nurse/RN  Name of Charge Nurse/RN Notified Desma Maxim RN  Date Charge Nurse/RN Notified 03/12/20  Time Charge Nurse/RN Notified 1500  Notify: Provider  Provider Name/Title Nolberto Hanlon  Date Provider Notified 03/12/20  Time Provider Notified 1515  Notification Type Page  Notification Reason Change in status  Response No new orders  Date of Provider Response 03/12/20  Time of Provider Response [id number]  [id number] Will continue to monitor. Resting at this.

## 2020-03-13 LAB — CBC WITH DIFFERENTIAL/PLATELET
Abs Immature Granulocytes: 0.16 10*3/uL — ABNORMAL HIGH (ref 0.00–0.07)
Basophils Absolute: 0.1 10*3/uL (ref 0.0–0.1)
Basophils Relative: 1 %
Eosinophils Absolute: 0.9 10*3/uL — ABNORMAL HIGH (ref 0.0–0.5)
Eosinophils Relative: 5 %
HCT: 31.1 % — ABNORMAL LOW (ref 39.0–52.0)
Hemoglobin: 9.5 g/dL — ABNORMAL LOW (ref 13.0–17.0)
Immature Granulocytes: 1 %
Lymphocytes Relative: 16 %
Lymphs Abs: 2.9 10*3/uL (ref 0.7–4.0)
MCH: 25.9 pg — ABNORMAL LOW (ref 26.0–34.0)
MCHC: 30.5 g/dL (ref 30.0–36.0)
MCV: 84.7 fL (ref 80.0–100.0)
Monocytes Absolute: 1.6 10*3/uL — ABNORMAL HIGH (ref 0.1–1.0)
Monocytes Relative: 9 %
Neutro Abs: 12.6 10*3/uL — ABNORMAL HIGH (ref 1.7–7.7)
Neutrophils Relative %: 68 %
Platelets: 222 10*3/uL (ref 150–400)
RBC: 3.67 MIL/uL — ABNORMAL LOW (ref 4.22–5.81)
RDW: 19.2 % — ABNORMAL HIGH (ref 11.5–15.5)
WBC: 18.2 10*3/uL — ABNORMAL HIGH (ref 4.0–10.5)
nRBC: 0 % (ref 0.0–0.2)

## 2020-03-13 LAB — GLUCOSE, CAPILLARY
Glucose-Capillary: 110 mg/dL — ABNORMAL HIGH (ref 70–99)
Glucose-Capillary: 116 mg/dL — ABNORMAL HIGH (ref 70–99)
Glucose-Capillary: 117 mg/dL — ABNORMAL HIGH (ref 70–99)
Glucose-Capillary: 133 mg/dL — ABNORMAL HIGH (ref 70–99)
Glucose-Capillary: 139 mg/dL — ABNORMAL HIGH (ref 70–99)
Glucose-Capillary: 150 mg/dL — ABNORMAL HIGH (ref 70–99)
Glucose-Capillary: 162 mg/dL — ABNORMAL HIGH (ref 70–99)

## 2020-03-13 MED ORDER — ALBUTEROL SULFATE (2.5 MG/3ML) 0.083% IN NEBU: 2.5000 mg | INHALATION_SOLUTION | Freq: Four times a day (QID) | RESPIRATORY_TRACT | Status: DC | PRN

## 2020-03-13 MED ORDER — IPRATROPIUM-ALBUTEROL 0.5-2.5 (3) MG/3ML IN SOLN
3.0000 mL | Freq: Two times a day (BID) | RESPIRATORY_TRACT | Status: DC
  Administered 2020-03-13: 3 mL via RESPIRATORY_TRACT
  Filled 2020-03-13: qty 3

## 2020-03-13 MED ORDER — CHLORHEXIDINE GLUCONATE CLOTH 2 % EX PADS: 6.0000 | MEDICATED_PAD | Freq: Every day | CUTANEOUS | Status: DC

## 2020-03-13 NOTE — Progress Notes (Signed)
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 Patient had recent history of Covid pneumonia and was on ventilator because he progressed to ARDS. Because of little improvement in the ventilatory and oxygenation, he underwent tracheostomy and was discharged to [facility]. He had recent history of UTI, multidrug-resistant Pseudomonas pneumonia, stenotrophomonas bacteremia. Because of sepsis and shock, he developed renal failure requiring dialysis. On 5/19, he developed hemoptysis and was transferred to [facility] ED for evaluation. PCCM,ID,Nephrology following here. 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 He also underwent tracheostomy tube change by PCCM for persistent leak. Due to lack of insurance, he cannot go back to [employer]. Social worker assisting on placement. Anticipate prolonged hospitalization.  1/8 Admit to [facility] for COVID PNA 2/23 Transfer to [facility] on vent 5/19 Transfer to [facility] for hemoptysis. #6 cuffed trach placed. Back on vent. Bronch performed 5/20 Transitioned to trach collar.  5/22 back on vent full time 5/24 back on antibiotics for fevers 6/1 Initiating PSV weans 6/3 Large cuff leak, trach exchange 6/7 transitioned to [facility] 6/9 remains on TC continuously since 6/7  Consultants  . Palliative care . Wound care . Interventional Radiology . Infectious Disease . Nephrology . Neurosurgery . ENT . PCCM  Procedures  . Mechanical Ventilation . Tracheostomy . Trach exchange . Transition to trach collar . Flexible laryngeal endoscopy - no source of bleeding seen. . Bronchoscopy . Tunnelled catheter  placement by IR . HD  Antibiotics   Anti-infectives (From admission, onward)   Start     Dose/Rate Route Frequency Ordered Stop   03/07/20 1800  cefTAZidime (FORTAZ) 2 g in sodium chloride 0.9 % 100 mL IVPB     Discontinue     2 g 200 mL/hr over 30 Minutes Intravenous Every T-Th-Sa (1800) 03/06/20 1255     03/07/20 1200  vancomycin (VANCOCIN) IVPB 500 mg/100 ml premix     Discontinue     500 mg 100 mL/hr over 60 Minutes Intravenous Every T-Th-Sa (Hemodialysis) 03/06/20 1255     03/04/20 2000  cefTAZidime (FORTAZ) 2 g in sodium chloride 0.9 % 100 mL IVPB  Status:  Discontinued        2 g 200 mL/hr over 30 Minutes Intravenous Every M-W-F (2000) 03/01/20 1136 03/06/20 1255   03/02/20 1200  vancomycin (VANCOCIN) IVPB 500 mg/100 ml premix        500 mg 100 mL/hr over 60 Minutes Intravenous Every T-Th-Sa (Hemodialysis) 03/01/20 1131 03/02/20 1710   03/01/20 1230  vancomycin (VANCOCIN) IVPB 500 mg/100 ml premix  Status:  Discontinued        500 mg 100 mL/hr over 60 Minutes Intravenous Every M-W-F (Hemodialysis) 03/01/20 1131 03/06/20 1255   02/28/20 1110  vancomycin (VANCOREADY) IVPB 500 mg/100 mL        over 60 Minutes  Continuous PRN 02/28/20 1112 02/28/20 1110   02/28/20 1101  vancomycin (VANCOCIN) 1-5 GM/200ML-% IVPB  Status:  Discontinued       Note to Pharmacy: Lytle Butte   : cabinet override      02/28/20 1101 02/28/20 1327   02/27/20 1800  cefTAZidime (FORTAZ) 1 g in sodium chloride 0.9 % 100 mL IVPB        1  g 200 mL/hr over 30 Minutes Intravenous Every 24 hours 02/27/20 0947 03/03/20 1738   02/27/20 1200  vancomycin (VANCOCIN) IVPB 500 mg/100 ml premix        500 mg 100 mL/hr over 60 Minutes Intravenous Once 02/27/20 0829 02/27/20 1343   02/24/20 1800  vancomycin (VANCOREADY) IVPB 500 mg/100 mL        500 mg 100 mL/hr over 60 Minutes Intravenous  Once 02/24/20 1308 02/24/20 1905   02/22/20 2200  metroNIDAZOLE (FLAGYL) tablet 500 mg     Discontinue     500 mg Per Tube Every  8 hours 02/22/20 0957 04/04/20 2359   02/22/20 1700  cefTRIAXone (ROCEPHIN) 2 g in sodium chloride 0.9 % 100 mL IVPB  Status:  Discontinued        2 g 200 mL/hr over 30 Minutes Intravenous Every 24 hours 02/22/20 0957 02/27/20 0947   02/22/20 1200  vancomycin (VANCOCIN) IVPB 500 mg/100 ml premix  Status:  Discontinued        500 mg 100 mL/hr over 60 Minutes Intravenous Every T-Th-Sa (Hemodialysis) 02/20/20 1253 02/21/20 1040   02/21/20 1431  vancomycin variable dose per unstable renal function (pharmacist dosing)  Status:  Discontinued         Does not apply See admin instructions 02/21/20 1431 03/01/20 1131   02/21/20 1200  vancomycin (VANCOREADY) IVPB 500 mg/100 mL        500 mg 100 mL/hr over 60 Minutes Intravenous  Once 02/21/20 0956 02/21/20 1253   02/21/20 0714  vancomycin (VANCOCIN) 500-5 MG/100ML-% IVPB  Status:  Discontinued       Note to Pharmacy: Ashley Akin   : cabinet override      02/21/20 0714 02/21/20 1346   02/20/20 1400  vancomycin (VANCOREADY) IVPB 1250 mg/250 mL        1,250 mg 166.7 mL/hr over 90 Minutes Intravenous  Once 02/20/20 1253 02/20/20 1538   02/20/20 1200  levofloxacin (LEVAQUIN) IVPB 500 mg  Status:  Discontinued        500 mg 100 mL/hr over 60 Minutes Intravenous Every 48 hours 02/20/20 1020 02/21/20 0943   02/15/20 1545  vancomycin (VANCOCIN) IVPB 1000 mg/200 mL premix  Status:  Discontinued       "Followed by" Linked Group Details   1,000 mg 200 mL/hr over 60 Minutes Intravenous Every 24 hours 02/14/20 1530 02/14/20 1745   02/15/20 0330  ceFEPIme (MAXIPIME) 2 g in sodium chloride 0.9 % 100 mL IVPB  Status:  Discontinued        2 g 200 mL/hr over 30 Minutes Intravenous Every 12 hours 02/14/20 1531 02/14/20 1531   02/14/20 1531  ceFEPIme (MAXIPIME) 2 g in sodium chloride 0.9 % 100 mL IVPB  Status:  Discontinued        2 g 200 mL/hr over 30 Minutes Intravenous Every 12 hours 02/14/20 1531 02/14/20 1745   02/14/20 1530  ceFEPIme (MAXIPIME) 2 g in  sodium chloride 0.9 % 100 mL IVPB  Status:  Discontinued        2 g 200 mL/hr over 30 Minutes Intravenous  Once 02/14/20 1520 02/14/20 1531   02/14/20 1530  vancomycin (VANCOREADY) IVPB 1500 mg/300 mL  Status:  Discontinued       "Followed by" Linked Group Details   1,500 mg 150 mL/hr over 120 Minutes Intravenous  Once 02/14/20 1530 02/14/20 1745    .  Marland Kitchen   Subjective  The patient is resting comfortably. No new complaints.  Objective  Vitals:  Vitals:   03/13/20 1548 03/13/20 1625  BP:  (!) 124/57  Pulse: 92 (!) 56  Resp: (!) 24 (!) 28  Temp:  98.5 F (36.9 C)  SpO2: 92% 96%   Exam:  Constitutional:  . No acute distress.  Neck:  . neck appears normal, no masses, normal ROM, supple . no thyromegaly . Trach collar in place. Respiratory:  . No increased work of breathing. . No wheezes, rales, or rhonchi . No tactile fremitus Cardiovascular:  . Regular rate and rhythm . No murmurs, ectopy, or gallups. . No lateral PMI. No thrills. Abdomen:  . Abdomen is soft, non-tender, non-distended . No hernias, masses, or organomegaly . Normoactive bowel sounds.  Musculoskeletal:  . No cyanosis, clubbing, or edema Skin:  . No rashes, lesions, ulcers . palpation of skin: no induration or nodules Neurologic:  . Patient is unable to cooperate with exam. Psychiatric:  Patient is unable to cooperate with exam.  I have personally reviewed the following:   Today's Data  . Vitals, CBC, Glucoses  Micro Data  . Blood cultures x 2 (03/03/2020) No growth  Scheduled Meds: . apixaban  2.5 mg Per Tube BID  . chlorhexidine gluconate (MEDLINE KIT)  15 mL Mouth Rinse BID  . Chlorhexidine Gluconate Cloth  6 each Topical Q0600  . Chlorhexidine Gluconate Cloth  6 each Topical Q0600  . feeding supplement (PRO-STAT SUGAR FREE 64)  30 mL Per Tube BID  . insulin aspart  0-6 Units Subcutaneous Q4H  . insulin aspart  2 Units Subcutaneous Q4H  . ipratropium-albuterol  3 mL Nebulization BID    . levothyroxine  25 mcg Per Tube Q0600  . mouth rinse  15 mL Mouth Rinse 10 times per day  . metoprolol tartrate  12.5 mg Per Tube BID  . metroNIDAZOLE  500 mg Per Tube Q8H  . nutrition supplement (JUVEN)  1 packet Per Tube BID BM  . pantoprazole sodium  40 mg Per Tube Daily  . vancomycin  500 mg Intravenous Q T,Th,Sa-HD   Continuous Infusions: . sodium chloride Stopped (03/09/20 1302)  . cefTAZidime (FORTAZ)  IV 2 g (03/12/20 1836)  . feeding supplement (NEPRO CARB STEADY) 1,000 mL (03/12/20 2056)    Principal Problem:   Enterococcal bacteremia Active Problems:   Acute respiratory failure (HCC)   Hemoptysis   Pressure injury of skin   Protein-calorie malnutrition, severe   Decubitus ulcer of sacral region, stage 4 (HCC)   Fever   Palliative care by specialist   Goals of care, counseling/discussion   DNR (do not resuscitate)   LOS: 28 days   A & P  Acute on chronic hypoxemic respiratory failure requiring tracheostomy, prolonged FY:[id number] to post COVID fibrosis, deconditioning and multiple HCAPs.  Status post trach due to respiratory failure from Covid pneumonia.He ison trach collar.PCCM following. Most recently pneumonia has been multidrug-resistant Pseudomonas for which she was treated with Avycaz and mycin nebulizers at [facility]. Possible ongoing aspiration also. Presented with hemoptysis, aspiration of blood. Patient has cuffed trach placed by ENT.No active bleeding on bronchoscopy. He also underwent tracheostomy tube change by PCCM for persistent leak on 03/01/20. Chest x-ray done on 03/03/2020 showed bilateral pleural effusions, stable extensive opacity throughout the left hemithorax. Currently he is on trach collar, requiring 8 L of oxygen per minute. He continues to receive duonebs and CPT. Volume control per HD.  Paroxysmal A. GER:OXRJSVOTB rate is controlled. Anticoagulation was discontinued due to suspected GI bleed. He is currently on low-dose Eliquis.  Added low  dose toprol xl 12.'5mg'$  qd for improved rate control.  Sepsis/bacteremia:Enterococcus and Staphylococcus epidermidis bacteremia HD cath sepsis: TDC removed 5/26. Also has osteomyelitis fifth sacral segment. There is high suspicion for ongoing aspiration. MRI pelvis/sacrum showed osteomyelitis of fifth sacral segment, edema in the muscle around both hips and proximal thighs with possible myositis. ID had recommended treatment for 6 weeks for sacral osteomyelitis and staph epidermidis bacteremia with vancomycin, fortaz and metronidazole.IR consulted for bone biopsy but due to high risk it was not done. He has persistent leukocytosis. Continue to monitor CBC.  ESRD:on HD since mid March 2021. AKI is due to gent toxicity. HD MWF at [facility]. S/P new Tunnel catheter placement on  6/2 by IR. HD today.  Chronic normocytic anemia: Secondary to ESRD, critical illness. Has been transfused with PRBCs. Currently hemoglobin stable. Continue to monitor  Unstageable sacral pressure ulcer/osteomyelitis:Wound care following. Discussion as above.  Cachexia/severe protein malnutrition:On tube feeds. Nutrition following.  Mild elevated LFTs:Continue to monitor.  Severe Protein Calorie Malnutrition: Due to chronic illness. Nutrition consulted.  Pressure Injury 02/14/20 Sacrum Medial Stage 4 - Full thickness tissue loss with exposed bone, tendon or muscle. (Active)  02/14/20 1906  Location: Sacrum  Location Orientation: Medial  Staging: Stage 4 - Full thickness tissue loss with exposed bone, tendon or muscle.  Wound Description (Comments):   Present on Admission: Yes    Pressure Injury 03/03/20 Neck Anterior Stage 3 - Full thickness tissue loss. Subcutaneous fat may be visible but bone, tendon or muscle are NOT exposed. Device related injury under trach tube. (Active)  03/03/20 1805  Location: Neck  Location Orientation: Anterior  Staging: Stage 3 - Full thickness tissue loss. Subcutaneous  fat may be visible but bone, tendon or muscle are NOT exposed.  Wound Description (Comments): Device related injury under trach tube.   Present on Admission: No   I have seen and examined this patient myself. I have spent 34 minutes in his evaluation and care.  DVT Prophylaxis: Eliquis  CODE STATUS: DNR. 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 We have  requested for palliativecare evaluation.plz see note-trying to have discussion with pt  Per palliative-family had a meeting and that they wish to continue all aggressive care,  including vent and dialysis.  Per palliative care on 6/14 family is not interested in  meeting with them anymore and have no interest in de-escalating care.  Palliative  care has signed off  Family Communication: None available. Disposition: Patient is from [place]. Anticipate discharge to LTAC/SNF but due to lack of insurance, placement has been difficult. Social working Sports administrator.  Disposition Plan:  Status is: Inpatient Remains inpatient appropriate because:Unsafe d/c plan Dispo: The patient is from:[facility] Anticipated d/c is to: SNF/LTAC Anticipated d/c date is: Unknown Barriers to discharge: funding for LTAC placement.  Jamilynn Whitacre, DO [facility] Direct contact: see [email or web address]  7PM-7AM contact night coverage as above 03/13/2020, 7:18 PM  LOS: 28 days

## 2020-03-13 NOTE — Progress Notes (Signed)
[facility] Progress Note  Subjective: Shane Banks seen on HD, more alert today  Vitals:   03/13/20 0700 03/13/20 0753 03/13/20 0845 03/13/20 1145  BP: 119/61 119/61 (!) 121/56   Pulse: 95  90 (!) 107  Resp: (!) 30  (!) 30 (!) 40  Temp:  98.6 F (37 C)    TempSrc:  Axillary    SpO2: 97%  98% 99%  Weight:      Height:        Exam:   on HD, in bed, alert, eyes open  no jvd  Chest cta bilat  Cor reg no RG  Abd soft ntnd no ascites, +llq ostomy, +peg tube   Ext no LE edema    Can mouth yes or no to some simple questions         Summary: came from [facility] to [facility] after COVID hospitalization which included mech ventilation and trach placement. At [facility] had severe infections/ bacteremia/ PNA and developed AKI felt due to IV gent toxicity requiring initiation of HD 12/16/19. Admitted to ICU [facility] for trach bleed on 5/19. Was getting MWF HD at [facility] prior to admit here.   OP HD: started March 2021 at [facility] as above MWF   Getting HD here 3.5- 4h, UF 0.5- 1.5 L, hep 2000 prn   admit 62.8kg > range 56.9- 64.7kg here  Assessment/ Plan: 1. 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 Per CCM.  Still has secretion burden and requires suctioning, not a candidate for decannulation at this time.  RR remains in the 30's. Remains on broad-spec abx.  2. Enterococcus/ MRSE bacteremia HD cath sepsis - TDC removed 5/26, per ID getting 6 wks vanc/ fortaz/ flagyl for polymicrobial sepsis and sacral osteo.  3. Sacral osteo/ decub stage IV - IV abx as above per ID 4. ESRD/ AKI - on HD since mid March 2021, d/t gent toxicity. HD MWF at [facility]. S/P new TDC 6/2 by IR. Getting HD here on TTS schedule now. HD tomorrow then Friday to get back on MWF schedule.  5. Trach bleed - resolved 6. Tracheobronchitis - sp course of doxycycline 7. AMS - variable, seems overall improved 8. Atrial fib - per primary, on eliquis 2.5 BID 9. Anemia ckd -  transfuse prn, Hb 9.6. 10. Hypercalcemia:  w/ low PTH, prob from immobility, corrCa 11.0 > 10.5>10.3, improved. Cont low 2.0 Ca++ bath , PTH 29. If recurs will consider pamidronate or similar.  11. BP/vol - no sig vol excess now, 5kg down from admit wt, looks dry on exam, BP not tolerating even small UF.  12. SP peg/ divert colostomy 13. SP COVID pna - original problem in Feb at OSH 14. DNR/ poor prognosis-  LTACH only option for disposition given needs but apparently not a candidate. Family asked not to be contacted by PMT anymore, they have now signed off.  For now cont plan of care.    [name] 03/13/2020, 2:29 PM   Recent Labs  Lab 03/11/20 0236 03/11/20 0236 03/12/20 0427 03/12/20 0728 03/13/20 0351  K 4.2  --   --  4.6  --   BUN 127*  --   --  188*  --   CREATININE 3.28*  --   --  4.44*  --   CALCIUM 10.1  --   --  10.6*  --   PHOS  --   --   --  4.9*  --  HGB 9.5*   < > 9.8*  --  9.5*   < > = values in this interval not displayed.   Inpatient medications:  apixaban  2.5 mg Per Tube BID   chlorhexidine gluconate (MEDLINE KIT)  15 mL Mouth Rinse BID   Chlorhexidine Gluconate Cloth  6 each Topical Q0600   Chlorhexidine Gluconate Cloth  6 each Topical Q0600   feeding supplement (PRO-STAT SUGAR FREE 64)  30 mL Per Tube BID   insulin aspart  0-6 Units Subcutaneous Q4H   insulin aspart  2 Units Subcutaneous Q4H   ipratropium-albuterol  3 mL Nebulization QID   levothyroxine  25 mcg Per Tube Q0600   mouth rinse  15 mL Mouth Rinse 10 times per day   metoprolol tartrate  12.5 mg Per Tube BID   metroNIDAZOLE  500 mg Per Tube Q8H   nutrition supplement (JUVEN)  1 packet Per Tube BID BM   pantoprazole sodium  40 mg Per Tube Daily   vancomycin  500 mg Intravenous Q T,Th,Sa-HD    sodium chloride Stopped (03/09/20 1302)   cefTAZidime (FORTAZ)  IV 2 g (03/12/20 1836)   feeding supplement (NEPRO CARB STEADY) 1,000 mL (03/12/20 2056)   sodium chloride,  acetaminophen (TYLENOL) oral liquid 160 mg/5 mL, docusate, fentaNYL (SUBLIMAZE) injection, heparin, midazolam, midazolam, polyethylene glycol

## 2020-03-13 NOTE — Progress Notes (Signed)
NAME:  Shane Banks, MRN:  [id number], DOB:  08-Jun-1946, LOS: 63 ADMISSION DATE:  02/14/2020, CONSULTATION DATE:  5/19 REFERRING MD:  Dr. Alvino Chapel, CHIEF COMPLAINT:  Hemoptysis   Brief History   74 yo m with PMHx of afib with RVR, BPH, stroke, and COVID ARDS in January requiring trach and LTACH placement at [facility]. Had been tolerating trach collar, but on 5/19 developed hemoptysis and hypoxemic respiratory failure requiring vent. Transferred to [facility] on 5/19.  Past Medical History   has a past medical history of Acute on chronic respiratory failure with hypoxia ([place]), Atrial fibrillation with RVR ([place]), BPH (benign prostatic hyperplasia), COVID-19 virus infection, Pneumonia due to COVID-19 virus, Severe sepsis ([place]), and Stroke ([place]).  Significant Hospital Events   1/8  Admit to [facility] for COVID PNA 2/23 Transfer to [facility] on vent 5/19 Transfer to [facility] for hemoptysis. #6 cuffed trach placed. Back on vent. Bronch performed 5/20 Transitioned to trach collar.  5/22 back on vent full time 5/24 back on antibiotics for fevers 6/1 Initiating PSV weans 6/3 Large cuff leak, trach exchange 6/7 transitioned to [facility] 6/9 remains on TC continuously since 6/7  Consults:  ENT Nephrology ID PCCM  Procedures:  ENT flex scope 5/19 > no bleeding source identified FOB 5/19 > no pulmonary bleeding source identified.   Significant Diagnostic Tests:  CXR 5/28 >> R CVC placement confirmed Slightly improved aeration at the right base. 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 Edema in the muscles around the hips and in the posterior paraspinal musculature of the lower lumbar spine and in the buttocks which could represent myositis. Fluid-fluid level in the bladder may represent protein or debris in  the bladder. The possibility of urinary tract infection should be considered.  CXR 6/3> improved R lung aeration, worsening L sided opacity. Tracheostomy tube, HD catheter.   CXR 6/4>wosened R lung infiltrates and stable L sided infiltrate. Possible bilateral pleural effusions   Micro Data:    tracheal aspirate 5/7> for Pseudomonas A  BAL 5/19 > no growth 5/19 blood > NG 5/22 trach aspirate>> candida parasipolis 5/22 blood>> 1/4 GPC> staph epi 5/25 blood>> GPC (enterococcus on biofire) 2/4 bottles>> E. Faecalis, staph epi. 5/25 trach aspirate>>many PMN, few GPR, rare GPC> diphtheroids 5/25 blood cx>> + for Enterococcus Faecalis (gent resistant)/ Staphylococcus Epidermidis (tetracycline, vanc, rifampin sensitive) 5/27 BCx> ngtd    Antimicrobials:  Levofloxacin 5/24>5/26 vanc 5/25> Ceftriaxone  5/27>6/1 Metronidazole 5/27>  [name] 6/1>  Interim history/subjective:  No acute distress at rest.  Poorly reactive  Objective   Blood pressure (!) 121/56, pulse 90, temperature 98.6 F (37 C), temperature source Axillary, resp. rate (!) 30, height _0  (1.753 m), weight 58.5 kg, SpO2 98 %.    FiO2 (%):  [35 %-40 %] 35 %   Intake/Output Summary (Last 24 hours) at 03/13/2020 0855 Last data filed at 03/13/2020 0300 Gross per 24 hour  Intake --  Output 161 ml  Net -161 ml   Filed Weights   03/12/20 0500 03/12/20 0700 03/12/20 1112  Weight: 61.2 kg 57.6 kg 58.5 kg    Examination:   Chronically ear pulling man on trach collar Trach in place is unremarkable Decreased breath sounds throughout with coarse rhonchi Right tunneled HD catheter is in place Heart sounds are distant Positive bowel sounds, PEG in place along with  ostomy Reported to have sacral decubitus Lower extremities with edema   Resolved Hospital Problem list     Assessment & Plan:   Acute on chronic hypoxemic respiratory failure requiring tracheostomy, 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   Continue trach collar Suction as needed FiO2 to keep sats greater than 88% Currently on Fortaz Flagyl vancomycin History of Enterococcus in the blood Hemodynamically stable okay to remain in progressive care unit    Other issues per primary Enterococcus/ MRSE bacteremia HD cath sepsis  per ID Sacral osteo/ decub stage IV  Afib S/p PEG, diverting ostomy Renal failure per nephrology  Richardson Landry Joesphine Schemm ACNP Acute Care Nurse Practitioner [place] Please consult Amion 03/13/2020, 8:55 AM

## 2020-03-14 LAB — CBC WITH DIFFERENTIAL/PLATELET
Abs Immature Granulocytes: 0.19 10*3/uL — ABNORMAL HIGH (ref 0.00–0.07)
Basophils Absolute: 0.1 10*3/uL (ref 0.0–0.1)
Basophils Relative: 1 %
Eosinophils Absolute: 0.5 10*3/uL (ref 0.0–0.5)
Eosinophils Relative: 2 %
HCT: 30 % — ABNORMAL LOW (ref 39.0–52.0)
Hemoglobin: 9.4 g/dL — ABNORMAL LOW (ref 13.0–17.0)
Immature Granulocytes: 1 %
Lymphocytes Relative: 13 %
Lymphs Abs: 2.8 10*3/uL (ref 0.7–4.0)
MCH: 26.1 pg (ref 26.0–34.0)
MCHC: 31.3 g/dL (ref 30.0–36.0)
MCV: 83.3 fL (ref 80.0–100.0)
Monocytes Absolute: 1.7 10*3/uL — ABNORMAL HIGH (ref 0.1–1.0)
Monocytes Relative: 8 %
Neutro Abs: 16.8 10*3/uL — ABNORMAL HIGH (ref 1.7–7.7)
Neutrophils Relative %: 75 %
Platelets: 227 10*3/uL (ref 150–400)
RBC: 3.6 MIL/uL — ABNORMAL LOW (ref 4.22–5.81)
RDW: 18.9 % — ABNORMAL HIGH (ref 11.5–15.5)
WBC: 22.1 10*3/uL — ABNORMAL HIGH (ref 4.0–10.5)
nRBC: 0 % (ref 0.0–0.2)

## 2020-03-14 LAB — BASIC METABOLIC PANEL
Anion gap: 15 (ref 5–15)
BUN: 134 mg/dL — ABNORMAL HIGH (ref 8–23)
CO2: 27 mmol/L (ref 22–32)
Calcium: 9.8 mg/dL (ref 8.9–10.3)
Chloride: 98 mmol/L (ref 98–111)
Creatinine, Ser: 3.6 mg/dL — ABNORMAL HIGH (ref 0.61–1.24)
GFR calc Af Amer: 18 mL/min — ABNORMAL LOW (ref >60)
GFR calc non Af Amer: 16 mL/min — ABNORMAL LOW (ref >60)
Glucose, Bld: 172 mg/dL — ABNORMAL HIGH (ref 70–99)
Potassium: 3.9 mmol/L (ref 3.5–5.1)
Sodium: 140 mmol/L (ref 135–145)

## 2020-03-14 LAB — GLUCOSE, CAPILLARY
Glucose-Capillary: 161 mg/dL — ABNORMAL HIGH (ref 70–99)
Glucose-Capillary: 175 mg/dL — ABNORMAL HIGH (ref 70–99)
Glucose-Capillary: 164 mg/dL — ABNORMAL HIGH (ref 70–99)
Glucose-Capillary: 190 mg/dL — ABNORMAL HIGH (ref 70–99)
Glucose-Capillary: 169 mg/dL — ABNORMAL HIGH (ref 70–99)
Glucose-Capillary: 162 mg/dL — ABNORMAL HIGH (ref 70–99)

## 2020-03-14 LAB — VANCOMYCIN, RANDOM: Vancomycin Rm: 20

## 2020-03-14 MED ORDER — HEPARIN SODIUM (PORCINE) 1000 UNIT/ML IJ SOLN
INTRAMUSCULAR | Status: AC
  Administered 2020-03-14: 3800 [IU] via INTRAVENOUS_CENTRAL
  Filled 2020-03-14: qty 4

## 2020-03-14 MED ORDER — VANCOMYCIN HCL IN DEXTROSE 500-5 MG/100ML-% IV SOLN
INTRAVENOUS | Status: AC
  Administered 2020-03-14: 500 mg via INTRAVENOUS
  Filled 2020-03-14: qty 100

## 2020-03-14 MED ORDER — CHLORHEXIDINE GLUCONATE CLOTH 2 % EX PADS
6.0000 | MEDICATED_PAD | Freq: Every day | CUTANEOUS | Status: DC
  Administered 2020-03-14 – 2020-03-18 (×4): 6 via TOPICAL

## 2020-03-14 MED ORDER — IPRATROPIUM-ALBUTEROL 0.5-2.5 (3) MG/3ML IN SOLN: 3.0000 mL | Freq: Four times a day (QID) | RESPIRATORY_TRACT | Status: DC | PRN

## 2020-03-14 NOTE — Progress Notes (Signed)
Pharmacy Antibiotic Note  Shane Banks is a 74 y.o. male admitted on 02/14/2020 with CoNS/E.faecalis bacteremia/sacral osteomyelitis/pneumonia. 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 Line pulled 5/26, line holiday and had it replaced 5/28. TTE negative. Repeat BCx are negative. ID planning 6 weeks of therapy.  Vanc random pre dialysis= 20 (goal 15-25 mcg/ml preHD)  Height: 5\' 9"  (175.3 cm) Weight: 60.9 kg (134 lb 4.2 oz) IBW/kg (Calculated) : 70.7  Temp (24hrs), Avg:98.5 F (36.9 C), Min:98 F (36.7 C), Max:99 F (37.2 C)  Recent Labs  Lab 03/07/20 1000 03/08/20 0530 03/09/20 0334 03/09/20 1110 03/10/20 0307 03/11/20 0236 03/12/20 0427 03/12/20 0728 03/13/20 0351 03/14/20 0457  WBC  --    < >   < >  --  25.4* 21.1* 20.3*  --  18.2* 22.1*  CREATININE  --   --   --  3.89*  --  3.28*  --  4.44*  --  3.60*  VANCORANDOM 20  --   --   --   --   --   --   --   --  20   < > = values in this interval not displayed.    Estimated Creatinine Clearance: 16 mL/min (A) (by C-G formula based on SCr of 3.6 mg/dL (H)).    Allergies  Allergen Reactions   Aspirin Rash   Cephalexin Other (See Comments)    dizziness   Penicillins     Itching,swelling    Plan: Continue Vanc 500mg  IV qHD  Continue Fortaz  2gm IV qHD Continue Flagyl 500mg  PT Q8H per MD Monitor HD schedule/tolerance, weekly vanc trough Abx end date - 04/04/20 per ID   Maycie Luera A. Levada Dy, PharmD, BCPS, FNKF Clinical Pharmacist [place] Please utilize Amion for appropriate phone number to reach the unit pharmacist ([place])    03/14/2020 8:56 AM

## 2020-03-14 NOTE — Progress Notes (Signed)
NAME:  Shane Banks, MRN:  [id number], DOB:  1946/06/27, LOS: 4 ADMISSION DATE:  02/14/2020, CONSULTATION DATE:  5/19 REFERRING MD:  Dr. Alvino Chapel, CHIEF COMPLAINT:  Hemoptysis   Brief History   74 yo m with PMHx of afib with RVR, BPH, stroke, and COVID ARDS in January requiring trach and LTACH placement at [facility]. Had been tolerating trach collar, but on 5/19 developed hemoptysis and hypoxemic respiratory failure requiring vent. Transferred to [facility] on 5/19.  Past Medical History   has a past medical history of Acute on chronic respiratory failure with hypoxia ([place]), Atrial fibrillation with RVR ([place]), BPH (benign prostatic hyperplasia), COVID-19 virus infection, Pneumonia due to COVID-19 virus, Severe sepsis ([place]), and Stroke ([place]).  Significant Hospital Events   1/8  Admit to [facility] for COVID PNA 2/23 Transfer to [facility] on vent 5/19 Transfer to [facility] for hemoptysis. #6 cuffed trach placed. Back on vent. Bronch performed 5/20 Transitioned to trach collar.  5/22 back on vent full time 5/24 back on antibiotics for fevers 6/1 Initiating PSV weans 6/3 Large cuff leak, trach exchange 6/7 transitioned to [facility] 6/9 remains on TC continuously since 6/7  Consults:  ENT Nephrology ID PCCM  Procedures:  ENT flex scope 5/19 > no bleeding source identified FOB 5/19 > no pulmonary bleeding source identified.   Significant Diagnostic Tests:  CXR 5/28 >> R CVC placement confirmed Slightly improved aeration at the right base. 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 Edema in the muscles around the hips and in the posterior paraspinal musculature of the lower lumbar spine and in the buttocks which could represent myositis. Fluid-fluid level in the bladder may represent protein or debris in  the bladder. The possibility of urinary tract infection should be considered.  CXR 6/3> improved R lung aeration, worsening L sided opacity. Tracheostomy tube, HD catheter.   CXR 6/4>wosened R lung infiltrates and stable L sided infiltrate. Possible bilateral pleural effusions   Micro Data:    tracheal aspirate 5/7> for Pseudomonas A  BAL 5/19 > no growth 5/19 blood > NG 5/22 trach aspirate>> candida parasipolis 5/22 blood>> 1/4 GPC> staph epi 5/25 blood>> GPC (enterococcus on biofire) 2/4 bottles>> E. Faecalis, staph epi. 5/25 trach aspirate>>many PMN, few GPR, rare GPC> diphtheroids 5/25 blood cx>> + for Enterococcus Faecalis (gent resistant)/ Staphylococcus Epidermidis (tetracycline, vanc, rifampin sensitive) 5/27 BCx> ngtd    Antimicrobials:  Levofloxacin 5/24>5/26 vanc 5/25> Ceftriaxone  5/27>6/1 Metronidazole 5/27>  [name] 6/1>  Interim history/subjective:  No clinical change.  Still intermittently requiring suctioning Remains on ATC, intermittently tachypneic   Objective   Blood pressure 130/71, pulse 100, temperature 98.9 F (37.2 C), temperature source Axillary, resp. rate (!) 34, height _0  (1.753 m), weight 60.9 kg, SpO2 96 %.    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   Continue trach collar ad lib. Has required intermittent suctioning, has  periods of tachypnea.  Based on previous evaluation he is tachypneic whether he is on ATC or ventilated.  No clear indication to return to ventilation NOT a candidate for decannulation.  Can continue to work with SLP, PMV if able to tolerate Remains on ceftazidime Flagyl, vancomycin per [facility] plans  We will see weekly. Please call if we can assist sooner.   Baltazar Apo, MD, PhD 03/14/2020, 11:37 AM [place] [facility] [phone] or if no answer [phone]

## 2020-03-14 NOTE — Progress Notes (Signed)
Physical Therapy Treatment Patient Details Name: Shane Banks MRN: [id number] DOB: 21-Feb-1946 Today's Date: 03/14/2020    History of Present Illness 74 yo m with PMHx of afib with RVR, BPH, stroke, and COVID ARDS in January requiring trach and LTACH placement at [facility]. Had been tolerating trach collar, but on 5/19 developed hemoptysis and hypoxemic respiratory failure requiring vent. Transferred to [facility] on 5/19.  5/19 back on vent, 6/7 transitioned to [facility]    PT Comments    Pt with limited ability to participate.  Session focused on stretching, positioning, and PROM to prevent further contractions.  Additionally, sat pt EOB for trunk activation and pulmonary hygeine.  Pt requiring total assist of 2 for all.  He did have increased RR and HR with transfers.  Recommend maxi move for OOB as pt unable to assist.     Follow Up Recommendations  SNF;LTACH;Supervision/Assistance - 24 hour     Equipment Recommendations  Hospital bed    Recommendations for Other Services       Precautions / Restrictions Precautions Precautions: Fall Precaution Comments: trach collar    Mobility  Bed Mobility Overal bed mobility: Needs Assistance Bed Mobility: Supine to Sit;Sit to Supine;Rolling Rolling: Total assist   Supine to sit: Total assist;+2 for physical assistance Sit to supine: Total assist;+2 for physical assistance   General bed mobility comments: When returned to supine positioned in chair postion with bil feet in offloading boots  Transfers                 General transfer comment: deferred due to elevated RR and unable to follow commands  Ambulation/Gait                 Stairs             Wheelchair Mobility    Modified Rankin (Stroke Patients Only)       Balance Overall balance assessment: Needs assistance Sitting-balance support: Feet supported;No upper extremity supported (attempted to position UE support but unable) Sitting  balance-Leahy Scale: Zero Sitting balance - Comments: Sat EOB for 5 mins with max A to maintain balance.  Attempted to position UE to assist but unable.                                    Cognition Arousal/Alertness: Awake/alert Behavior During Therapy: Flat affect Overall Cognitive Status: Difficult to assess                                 General Comments: Pt not following commands; Pt gazing midline and unable to deviate eyes in either direction.      Exercises Low Level/ICU Exercises Ankle Circles/Pumps: PROM;10 reps;Supine;Both Hip ABduction/ADduction: PROM;Both;10 reps;Supine Heel Slides: PROM;Both;10 reps;Supine Other Exercises Other Exercises: Hip IR/HR PROM supine x 10 Other Exercises: R ankle PF stretch and knee flexion stretch 3 x 30 sec    General Comments General comments (skin integrity, edema, etc.): HR 98-110 bpm; RR up to 31 with ROM and rolling.      Pertinent Vitals/Pain Pain Assessment: Faces Faces Pain Scale: Hurts little more Pain Location: generalized Pain Descriptors / Indicators: Other (Comment) (increased RR and facial grimace with movement) Pain Intervention(s): Monitored during session;Repositioned;Limited activity within patient's tolerance    Home Living  Prior Function            PT Goals (current goals can now be found in the care plan section) Acute Rehab PT Goals Patient Stated Goal: pt unable to participate PT Goal Formulation: With patient Time For Goal Achievement: 03/20/20 Potential to Achieve Goals: Fair Progress towards PT goals: Progressing toward goals    Frequency    Min 2X/week      PT Plan Current plan remains appropriate    Co-evaluation              AM-PAC PT "6 Clicks" Mobility   Outcome Measure  Help needed turning from your back to your side while in a flat bed without using bedrails?: Total Help needed moving from lying on your back to  sitting on the side of a flat bed without using bedrails?: Total Help needed moving to and from a bed to a chair (including a wheelchair)?: Total Help needed standing up from a chair using your arms (e.g., wheelchair or bedside chair)?: Total Help needed to walk in hospital room?: Total Help needed climbing 3-5 steps with a railing? : Total 6 Click Score: 6    End of Session Equipment Utilized During Treatment: Oxygen Activity Tolerance: Patient tolerated treatment well Patient left: in bed;with call bell/phone within reach;with nursing/sitter in room;with bed alarm set Nurse Communication: Mobility status PT Visit Diagnosis: Muscle weakness (generalized) (M62.81);Adult, failure to thrive (R62.7);Other abnormalities of gait and mobility (R26.89)     Time: 4920-1007 PT Time Calculation (min) (ACUTE ONLY): 24 min  Charges:  $Therapeutic Exercise: 8-22 mins $Therapeutic Activity: 8-22 mins                     Abran Richard, PT Acute Rehab Services Pager [phone] [facility] Rehab [phone]     Karlton Lemon 03/14/2020, 4:01 PM

## 2020-03-14 NOTE — Progress Notes (Signed)
[facility] Progress Note  Subjective: pt seen on HD, more alert today  Vitals:   03/14/20 0900 03/14/20 0930 03/14/20 1000 03/14/20 1030  BP: (!) 95/57 (!) 96/57 (!) 92/52 (!) 98/55  Pulse: 89 88 92 75  Resp:      Temp:      TempSrc:      SpO2:      Weight:      Height:        Exam:   on HD, in bed, alert, eyes open  no jvd  Chest cta bilat  Cor reg no RG  Abd soft ntnd no ascites, +llq ostomy, +peg tube   Ext no LE edema    Can mouth yes or no to some simple questions         Summary: came from [facility] after COVID hospitalization which included mech ventilation and trach placement. At [facility] had severe infections/ bacteremia/ PNA and developed AKI felt due to IV gent toxicity requiring initiation of HD 12/16/19. Admitted to ICU [facility] for trach bleed on 5/19. Was getting MWF HD at [facility] prior to admit here.   OP HD: started March 2021 at [facility] as above MWF   Getting HD here 3.5- 4h, UF 0.5- 1.5 L, hep 2000 prn   admit 62.8kg > range 56.9- 64.7kg here  Assessment/ Plan: 1. 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 Per CCM.  Still has secretion burden and requires suctioning, not a candidate for decannulation at this time.  RR remains in the 30's.   2. Enterococcus/ MRSE bacteremia HD cath sepsis - TDC removed 5/26, per ID getting 6 wks vanc/ fortaz/ flagyl for polymicrobial cath sepsis and sacral osteomyelitis.  3. Sacral osteo/ decub stage IV - IV abx as per #2 4. ESRD/ AKI - on HD since mid March 2021, d/t gent toxicity. HD MWF at [facility]. S/P new TDC 6/2 by IR. Getting HD here on TTS schedule now. HD tomorrow then Friday to get back on MWF schedule.  5. Trach bleed - resolved 6. Tracheobronchitis - sp course of doxycycline 7. AMS - variable, seems overall improved 8. Atrial fib - per primary, on eliquis 2.5 BID 9. Anemia ckd - transfuse prn, Hb 9.6. 10. Hypercalcemia:  w/ low PTH, prob  from immobility, corrCa 11.0 > 10.5>10.3, improved. Cont low 2.0 Ca++ bath , PTH 29. If recurs will consider pamidronate or similar.  11. BP/vol - no sig vol excess now by exam, BP's dropping on HD w/o sig UF goal. ?septic.  Will bolus on HD today.  12. SP peg/ divert colostomy 13. SP COVID pna - original problem in Feb at OSH 14. DNR/ poor prognosis-  LTACH only option for disposition given needs but apparently not a candidate. Family asked not to be contacted by PMT anymore. PCT  have now signed off.  For now cont plan of care.    Shane Banks 03/14/2020, 10:37 AM   Recent Labs  Lab 03/12/20 0427 03/12/20 0728 03/13/20 0351 03/14/20 0457  K  --  4.6  --  3.9  BUN  --  188*  --  134*  CREATININE  --  4.44*  --  3.60*  CALCIUM  --  10.6*  --  9.8  PHOS  --  4.9*  --   --   HGB   < >  --  9.5* 9.4*   < > = values in this interval  not displayed.   Inpatient medications: . apixaban  2.5 mg Per Tube BID  . chlorhexidine gluconate (MEDLINE KIT)  15 mL Mouth Rinse BID  . Chlorhexidine Gluconate Cloth  6 each Topical Q0600  . Chlorhexidine Gluconate Cloth  6 each Topical Q0600  . feeding supplement (PRO-STAT SUGAR FREE 64)  30 mL Per Tube BID  . insulin aspart  0-6 Units Subcutaneous Q4H  . insulin aspart  2 Units Subcutaneous Q4H  . ipratropium-albuterol  3 mL Nebulization BID  . levothyroxine  25 mcg Per Tube Q0600  . mouth rinse  15 mL Mouth Rinse 10 times per day  . metoprolol tartrate  12.5 mg Per Tube BID  . metroNIDAZOLE  500 mg Per Tube Q8H  . nutrition supplement (JUVEN)  1 packet Per Tube BID BM  . pantoprazole sodium  40 mg Per Tube Daily  . vancomycin  500 mg Intravenous Q T,Th,Sa-HD   . sodium chloride Stopped (03/09/20 1302)  . cefTAZidime (FORTAZ)  IV 2 g (03/12/20 1836)  . feeding supplement (NEPRO CARB STEADY) 1,000 mL (03/12/20 2056)   sodium chloride, acetaminophen (TYLENOL) oral liquid 160 mg/5 mL, albuterol, docusate, fentaNYL (SUBLIMAZE) injection, heparin,  midazolam, midazolam, polyethylene glycol

## 2020-03-14 NOTE — Plan of Care (Signed)

## 2020-03-14 NOTE — Progress Notes (Signed)
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 Patient had recent history of Covid pneumonia and was on ventilator because he progressed to ARDS. Because of little improvement in the ventilatory and oxygenation, he underwent tracheostomy and was discharged to [facility]. He had recent history of UTI, multidrug-resistant Pseudomonas pneumonia, stenotrophomonas bacteremia. Because of sepsis and shock, he developed renal failure requiring dialysis. On 5/19, he developed hemoptysis and was transferred to [facility] ED for evaluation. PCCM,ID,Nephrology following here. 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 He also underwent tracheostomy tube change by PCCM for persistent leak. Due to lack of insurance, he cannot go back to [employer]. Social worker assisting on placement. Anticipate prolonged hospitalization.  1/8 Admit to [facility] for COVID PNA 2/23 Transfer to [facility] on vent 5/19 Transfer to [facility] for hemoptysis. #6 cuffed trach placed. Back on vent. Bronch performed 5/20 Transitioned to trach collar.  5/22 back on vent full time 5/24 back on antibiotics for fevers 6/1 Initiating PSV weans 6/3 Large cuff leak, trach exchange 6/7 transitioned to [facility] 6/9 remains on TC continuously since 6/7  Consultants  . Palliative care . Wound care . Interventional Radiology . Infectious Disease . Nephrology . Neurosurgery . ENT . PCCM  Procedures  . Mechanical Ventilation . Tracheostomy . Trach exchange . Transition to trach collar . Flexible laryngeal endoscopy - no source of bleeding seen. . Bronchoscopy . Tunnelled catheter  placement by IR . HD  Antibiotics   Anti-infectives (From admission, onward)   Start     Dose/Rate Route Frequency Ordered Stop   03/07/20 1800  cefTAZidime (FORTAZ) 2 g in sodium chloride 0.9 % 100 mL IVPB     Discontinue     2 g 200 mL/hr over 30 Minutes Intravenous Every T-Th-Sa (1800) 03/06/20 1255     03/07/20 1200  vancomycin (VANCOCIN) IVPB 500 mg/100 ml premix     Discontinue     500 mg 100 mL/hr over 60 Minutes Intravenous Every T-Th-Sa (Hemodialysis) 03/06/20 1255     03/04/20 2000  cefTAZidime (FORTAZ) 2 g in sodium chloride 0.9 % 100 mL IVPB  Status:  Discontinued        2 g 200 mL/hr over 30 Minutes Intravenous Every M-W-F (2000) 03/01/20 1136 03/06/20 1255   03/02/20 1200  vancomycin (VANCOCIN) IVPB 500 mg/100 ml premix        500 mg 100 mL/hr over 60 Minutes Intravenous Every T-Th-Sa (Hemodialysis) 03/01/20 1131 03/02/20 1710   03/01/20 1230  vancomycin (VANCOCIN) IVPB 500 mg/100 ml premix  Status:  Discontinued        500 mg 100 mL/hr over 60 Minutes Intravenous Every M-W-F (Hemodialysis) 03/01/20 1131 03/06/20 1255   02/28/20 1110  vancomycin (VANCOREADY) IVPB 500 mg/100 mL        over 60 Minutes  Continuous PRN 02/28/20 1112 02/28/20 1110   02/28/20 1101  vancomycin (VANCOCIN) 1-5 GM/200ML-% IVPB  Status:  Discontinued       Note to Pharmacy: Lytle Butte   : cabinet override      02/28/20 1101 02/28/20 1327   02/27/20 1800  cefTAZidime (FORTAZ) 1 g in sodium chloride 0.9 % 100 mL IVPB        1  g 200 mL/hr over 30 Minutes Intravenous Every 24 hours 02/27/20 0947 03/03/20 1738   02/27/20 1200  vancomycin (VANCOCIN) IVPB 500 mg/100 ml premix        500 mg 100 mL/hr over 60 Minutes Intravenous Once 02/27/20 0829 02/27/20 1343   02/24/20 1800  vancomycin (VANCOREADY) IVPB 500 mg/100 mL        500 mg 100 mL/hr over 60 Minutes Intravenous  Once 02/24/20 1308 02/24/20 1905   02/22/20 2200  metroNIDAZOLE (FLAGYL) tablet 500 mg     Discontinue     500 mg Per Tube Every  8 hours 02/22/20 0957 04/04/20 2359   02/22/20 1700  cefTRIAXone (ROCEPHIN) 2 g in sodium chloride 0.9 % 100 mL IVPB  Status:  Discontinued        2 g 200 mL/hr over 30 Minutes Intravenous Every 24 hours 02/22/20 0957 02/27/20 0947   02/22/20 1200  vancomycin (VANCOCIN) IVPB 500 mg/100 ml premix  Status:  Discontinued        500 mg 100 mL/hr over 60 Minutes Intravenous Every T-Th-Sa (Hemodialysis) 02/20/20 1253 02/21/20 1040   02/21/20 1431  vancomycin variable dose per unstable renal function (pharmacist dosing)  Status:  Discontinued         Does not apply See admin instructions 02/21/20 1431 03/01/20 1131   02/21/20 1200  vancomycin (VANCOREADY) IVPB 500 mg/100 mL        500 mg 100 mL/hr over 60 Minutes Intravenous  Once 02/21/20 0956 02/21/20 1253   02/21/20 0714  vancomycin (VANCOCIN) 500-5 MG/100ML-% IVPB  Status:  Discontinued       Note to Pharmacy: Ashley Akin   : cabinet override      02/21/20 0714 02/21/20 1346   02/20/20 1400  vancomycin (VANCOREADY) IVPB 1250 mg/250 mL        1,250 mg 166.7 mL/hr over 90 Minutes Intravenous  Once 02/20/20 1253 02/20/20 1538   02/20/20 1200  levofloxacin (LEVAQUIN) IVPB 500 mg  Status:  Discontinued        500 mg 100 mL/hr over 60 Minutes Intravenous Every 48 hours 02/20/20 1020 02/21/20 0943   02/15/20 1545  vancomycin (VANCOCIN) IVPB 1000 mg/200 mL premix  Status:  Discontinued       "Followed by" Linked Group Details   1,000 mg 200 mL/hr over 60 Minutes Intravenous Every 24 hours 02/14/20 1530 02/14/20 1745   02/15/20 0330  ceFEPIme (MAXIPIME) 2 g in sodium chloride 0.9 % 100 mL IVPB  Status:  Discontinued        2 g 200 mL/hr over 30 Minutes Intravenous Every 12 hours 02/14/20 1531 02/14/20 1531   02/14/20 1531  ceFEPIme (MAXIPIME) 2 g in sodium chloride 0.9 % 100 mL IVPB  Status:  Discontinued        2 g 200 mL/hr over 30 Minutes Intravenous Every 12 hours 02/14/20 1531 02/14/20 1745   02/14/20 1530  ceFEPIme (MAXIPIME) 2 g in  sodium chloride 0.9 % 100 mL IVPB  Status:  Discontinued        2 g 200 mL/hr over 30 Minutes Intravenous  Once 02/14/20 1520 02/14/20 1531   02/14/20 1530  vancomycin (VANCOREADY) IVPB 1500 mg/300 mL  Status:  Discontinued       "Followed by" Linked Group Details   1,500 mg 150 mL/hr over 120 Minutes Intravenous  Once 02/14/20 1530 02/14/20 1745     .   Subjective  The patient is resting comfortably. No new complaints.  Objective  Vitals:  Vitals:   03/14/20 1132 03/14/20 1508  BP: 130/71   Pulse: 98 (!) 101  Resp: (!) 32 (!) 25  Temp:    SpO2: 96% 96%   Exam:  Constitutional:  . No acute distress.  Neck:  . neck appears normal, no masses, normal ROM, supple . no thyromegaly . Trach collar in place. Respiratory:  . No increased work of breathing. . No wheezes, rales, or rhonchi . No tactile fremitus . Coarse breath sounds bilaterally Cardiovascular:  . Regular rate and rhythm . No murmurs, ectopy, or gallups. . No lateral PMI. No thrills. Abdomen:  . Abdomen is soft, non-tender, non-distended . No hernias, masses, or organomegaly . Normoactive bowel sounds.  Musculoskeletal:  . No cyanosis, clubbing . Mild edema Skin:  . No rashes, lesions, ulcers . palpation of skin: no induration or nodules Neurologic:  . Patient is unable to cooperate with exam. Psychiatric:  Patient is unable to cooperate with exam.  I have personally reviewed the following:   Today's Data  . Vitals, BMP, CBC, Glucoses  Micro Data  . Blood cultures x 2 (03/03/2020) No growth  Scheduled Meds: . apixaban  2.5 mg Per Tube BID  . chlorhexidine gluconate (MEDLINE KIT)  15 mL Mouth Rinse BID  . Chlorhexidine Gluconate Cloth  6 each Topical Q0600  . Chlorhexidine Gluconate Cloth  6 each Topical Q0600  . Chlorhexidine Gluconate Cloth  6 each Topical Q0600  . feeding supplement (PRO-STAT SUGAR FREE 64)  30 mL Per Tube BID  . insulin aspart  0-6 Units Subcutaneous Q4H  . insulin  aspart  2 Units Subcutaneous Q4H  . levothyroxine  25 mcg Per Tube Q0600  . mouth rinse  15 mL Mouth Rinse 10 times per day  . metoprolol tartrate  12.5 mg Per Tube BID  . metroNIDAZOLE  500 mg Per Tube Q8H  . nutrition supplement (JUVEN)  1 packet Per Tube BID BM  . pantoprazole sodium  40 mg Per Tube Daily  . vancomycin  500 mg Intravenous Q T,Th,Sa-HD   Continuous Infusions: . sodium chloride Stopped (03/09/20 1302)  . cefTAZidime (FORTAZ)  IV 2 g (03/12/20 1836)  . feeding supplement (NEPRO CARB STEADY) 1,000 mL (03/12/20 2056)    Principal Problem:   Enterococcal bacteremia Active Problems:   Acute respiratory failure (HCC)   Hemoptysis   Pressure injury of skin   Protein-calorie malnutrition, severe   Decubitus ulcer of sacral region, stage 4 (HCC)   Fever   Palliative care by specialist   Goals of care, counseling/discussion   DNR (do not resuscitate)   LOS: 29 days   A & P  Acute on chronic hypoxemic respiratory failure requiring tracheostomy, prolonged [id number] to post COVID fibrosis, deconditioning and multiple HCAPs.  Status post trach due to respiratory failure from Covid pneumonia.He ison trach collar.PCCM following. Most recently pneumonia has been multidrug-resistant Pseudomonas for which she was treated with Avycaz and mycin nebulizers at [facility]. Possible ongoing aspiration also. Presented with hemoptysis, aspiration of blood. Patient has cuffed trach placed by ENT.No active bleeding on bronchoscopy. He also underwent tracheostomy tube change by PCCM for persistent leak on 03/01/20. Chest x-ray done on 03/03/2020 showed bilateral pleural effusions, stable extensive opacity throughout the left hemithorax. Currently he is on trach collar, requiring 8 L of oxygen per minute. He continues to receive duonebs and CPT. Volume control per HD.  Paroxysmal A. FTD:DUKGURKYH rate is controlled. Anticoagulation was discontinued due to suspected GI bleed. He is  currently on  low-dose Eliquis. Added low dose toprol xl 12.'5mg'$  qd for improved rate control.  Sepsis/bacteremia:Enterococcus and Staphylococcus epidermidis bacteremia HD cath sepsis: TDC removed 5/26. Also has osteomyelitis fifth sacral segment. There is high suspicion for ongoing aspiration. MRI pelvis/sacrum showed osteomyelitis of fifth sacral segment, edema in the muscle around both hips and proximal thighs with possible myositis. ID had recommended treatment for 6 weeks for sacral osteomyelitis and staph epidermidis bacteremia with vancomycin, fortaz and metronidazole. IR consulted for bone biopsy but due to high risk it was not done. He has persistent leukocytosis. Continue to monitor CBC.  ESRD:on HD since mid March 2021. AKI is due to gent toxicity. HD MWF at [facility]. S/P new Tunnel catheter placement on  6/2 by IR. HD today again. Now back to MWF.  Chronic normocytic anemia: Secondary to ESRD, critical illness. Has been transfused with PRBCs. Currently hemoglobin stable. Continue to monitor  Unstageable sacral pressure ulcer/osteomyelitis:Wound care following. Discussion as above.  Cachexia/severe protein malnutrition:On tube feeds. Nutrition following.  Mild elevated LFTs:Continue to monitor.  Severe Protein Calorie Malnutrition: Due to chronic illness. Nutrition consulted.  Pressure Injury 02/14/20 Sacrum Medial Stage 4 - Full thickness tissue loss with exposed bone, tendon or muscle. (Active)  02/14/20 1906  Location: Sacrum  Location Orientation: Medial  Staging: Stage 4 - Full thickness tissue loss with exposed bone, tendon or muscle.  Wound Description (Comments):   Present on Admission: Yes    Pressure Injury 03/03/20 Neck Anterior Stage 3 - Full thickness tissue loss. Subcutaneous fat may be visible but bone, tendon or muscle are NOT exposed. Device related injury under trach tube. (Active)  03/03/20 1805  Location: Neck  Location Orientation: Anterior  Staging:  Stage 3 - Full thickness tissue loss. Subcutaneous fat may be visible but bone, tendon or muscle are NOT exposed.  Wound Description (Comments): Device related injury under trach tube.   Present on Admission: No   I have seen and examined this patient myself. I have spent 34 minutes in his evaluation and care.  DVT Prophylaxis: Eliquis  CODE STATUS: DNR. 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 We have  requested for palliativecare evaluation.plz see note-trying to have discussion with pt  Per palliative-family had a meeting and that they wish to continue all aggressive care,  including vent and dialysis.  Per palliative care on 6/14 family is not interested in  meeting with them anymore and have no interest in de-escalating care.  Palliative  care has signed off  Family Communication: None available. Disposition: Patient is from [place]. Anticipate discharge to LTAC/SNF but due to lack of insurance, placement has been difficult. Social working Sports administrator.  Disposition Plan:  Status is: Inpatient Remains inpatient appropriate because:Unsafe d/c plan Dispo: The patient is from:[facility] Anticipated d/c is to: SNF/LTAC Anticipated d/c date is: Unknown Barriers to discharge: funding for LTAC placement.  Wayne Brunker, DO [facility] Direct contact: see [email or web address]  7PM-7AM contact night coverage as above 03/14/2020, 3:46 PM  LOS: 28 days

## 2020-03-15 ENCOUNTER — Inpatient Hospital Stay (HOSPITAL_COMMUNITY): Payer: Medicare HMO

## 2020-03-15 LAB — CBC WITH DIFFERENTIAL/PLATELET
Abs Immature Granulocytes: 0.2 10*3/uL — ABNORMAL HIGH (ref 0.00–0.07)
Basophils Absolute: 0.1 10*3/uL (ref 0.0–0.1)
Basophils Relative: 1 %
Eosinophils Absolute: 0.2 10*3/uL (ref 0.0–0.5)
Eosinophils Relative: 1 %
HCT: 32.2 % — ABNORMAL LOW (ref 39.0–52.0)
Hemoglobin: 9.8 g/dL — ABNORMAL LOW (ref 13.0–17.0)
Immature Granulocytes: 1 %
Lymphocytes Relative: 10 %
Lymphs Abs: 2.2 10*3/uL (ref 0.7–4.0)
MCH: 26.2 pg (ref 26.0–34.0)
MCHC: 30.4 g/dL (ref 30.0–36.0)
MCV: 86.1 fL (ref 80.0–100.0)
Monocytes Absolute: 1.6 10*3/uL — ABNORMAL HIGH (ref 0.1–1.0)
Monocytes Relative: 8 %
Neutro Abs: 17.1 10*3/uL — ABNORMAL HIGH (ref 1.7–7.7)
Neutrophils Relative %: 79 %
Platelets: 241 10*3/uL (ref 150–400)
RBC: 3.74 MIL/uL — ABNORMAL LOW (ref 4.22–5.81)
RDW: 18.8 % — ABNORMAL HIGH (ref 11.5–15.5)
WBC: 21.4 10*3/uL — ABNORMAL HIGH (ref 4.0–10.5)
nRBC: 0 % (ref 0.0–0.2)

## 2020-03-15 LAB — POCT I-STAT 7, (LYTES, BLD GAS, ICA,H+H)
Acid-Base Excess: 4 mmol/L — ABNORMAL HIGH (ref 0.0–2.0)
Bicarbonate: 30.8 mmol/L — ABNORMAL HIGH (ref 20.0–28.0)
Calcium, Ion: 1.22 mmol/L (ref 1.15–1.40)
HCT: 31 % — ABNORMAL LOW (ref 39.0–52.0)
Hemoglobin: 10.5 g/dL — ABNORMAL LOW (ref 13.0–17.0)
O2 Saturation: 98 %
Patient temperature: 99.5
Potassium: 3.5 mmol/L (ref 3.5–5.1)
Sodium: 138 mmol/L (ref 135–145)
TCO2: 33 mmol/L — ABNORMAL HIGH (ref 22–32)
pCO2 arterial: 58.2 mmHg — ABNORMAL HIGH (ref 32.0–48.0)
pH, Arterial: 7.334 — ABNORMAL LOW (ref 7.350–7.450)
pO2, Arterial: 121 mmHg — ABNORMAL HIGH (ref 83.0–108.0)

## 2020-03-15 LAB — BASIC METABOLIC PANEL
Anion gap: 10 (ref 5–15)
BUN: 57 mg/dL — ABNORMAL HIGH (ref 8–23)
CO2: 28 mmol/L (ref 22–32)
Calcium: 9 mg/dL (ref 8.9–10.3)
Chloride: 97 mmol/L — ABNORMAL LOW (ref 98–111)
Creatinine, Ser: 2.23 mg/dL — ABNORMAL HIGH (ref 0.61–1.24)
GFR calc Af Amer: 33 mL/min — ABNORMAL LOW (ref >60)
GFR calc non Af Amer: 28 mL/min — ABNORMAL LOW (ref >60)
Glucose, Bld: 178 mg/dL — ABNORMAL HIGH (ref 70–99)
Potassium: 3.7 mmol/L (ref 3.5–5.1)
Sodium: 135 mmol/L (ref 135–145)

## 2020-03-15 LAB — GLUCOSE, CAPILLARY
Glucose-Capillary: 158 mg/dL — ABNORMAL HIGH (ref 70–99)
Glucose-Capillary: 139 mg/dL — ABNORMAL HIGH (ref 70–99)
Glucose-Capillary: 168 mg/dL — ABNORMAL HIGH (ref 70–99)
Glucose-Capillary: 168 mg/dL — ABNORMAL HIGH (ref 70–99)
Glucose-Capillary: 180 mg/dL — ABNORMAL HIGH (ref 70–99)

## 2020-03-15 LAB — MAGNESIUM: Magnesium: 2.2 mg/dL (ref 1.7–2.4)

## 2020-03-15 MED ORDER — NOREPINEPHRINE 4 MG/250ML-% IV SOLN
0.0000 ug/min | INTRAVENOUS | Status: DC
  Administered 2020-03-15: 5 ug/min via INTRAVENOUS
  Administered 2020-03-17: 2 ug/min via INTRAVENOUS
  Filled 2020-03-15 (×2): qty 250

## 2020-03-15 MED ORDER — SODIUM CHLORIDE 0.9 % IV BOLUS
250.0000 mL | Freq: Once | INTRAVENOUS | Status: AC | PRN
  Administered 2020-03-15: 250 mL via INTRAVENOUS

## 2020-03-15 MED ORDER — METOPROLOL TARTRATE 5 MG/5ML IV SOLN: 5.0000 mg | INTRAVENOUS | Status: DC | PRN

## 2020-03-15 MED ORDER — HEPARIN SODIUM (PORCINE) 1000 UNIT/ML IJ SOLN
INTRAMUSCULAR | Status: AC
  Filled 2020-03-15: qty 4

## 2020-03-15 NOTE — Progress Notes (Signed)
eLink Physician-Brief Progress Note Patient Name: Shane Banks DOB: Feb 03, 1946 MRN: [id number]   Date of Service  03/15/2020  HPI/Events of Note  Patient with a history of severe Covid 19 pneumonia, ARDS, chronic respiratory failure s/p tracheostomy, MDR Pseudomonas infection, AKI progressing to ESRD requiring chronic dialysis. Patient became abruptly altered this evening with focal neurological findings, and acute on chronic respiratory failure, and hypotension, his family rescinded his DNR and he was transferred to the ICU for mechanical ventilation and further work up.  eICU Interventions  New Patient Evaluation completed.        [name] [name] 03/15/2020, 9:58 PM

## 2020-03-15 NOTE — Progress Notes (Addendum)
Pt returned to [facility] after dialysis this afternoon.  He began abdominal breathing and desat O2 to 53% - 70%.  In contacted RT and called RRT. Charge Nurse arrived.  I contacted the pt.'s daugther Shane Banks, [phone].  Attending, Dr Karie Kirks, DO, was notified.  The pt.'s daugher was encouraged to come to the hospital.  She states that she thinks she wants to reversed the DNR and put him on a ventilator.  The daughter states that she will wait to talk to the Dr. At this time, pt. Remains diaphoretic, tachypneic, agonal breathing w/labored breaths.  Will continue to monitor.

## 2020-03-15 NOTE — Progress Notes (Signed)
Called to dialysis due to patient desaturating. Upon arrival patient's SAT's were 86%. Patient was suctioned & a moderate amount of tan / white thick secretions were obtained. FIO2 was increased to 55% on the venti mask set up. SAT's have improved & are currently 93%.

## 2020-03-15 NOTE — Progress Notes (Addendum)
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 Patient had recent history of Covid pneumonia and was on ventilator because he progressed to ARDS. Because of little improvement in the ventilatory and oxygenation, he underwent tracheostomy and was discharged to [facility]. He had recent history of UTI, multidrug-resistant Pseudomonas pneumonia, stenotrophomonas bacteremia. Because of sepsis and shock, he developed renal failure requiring dialysis. On 5/19, he developed hemoptysis and was transferred to [facility] ED for evaluation. PCCM,ID,Nephrology following here. 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 He also underwent tracheostomy tube change by PCCM for persistent leak. Due to lack of insurance, he cannot go back to [place]. Social worker assisting on placement. Anticipate prolonged hospitalization.  1/8 Admit to [facility] for COVID PNA 2/23 Transfer to [facility] on vent 5/19 Transfer to [facility] for hemoptysis. #6 cuffed trach placed. Back on vent. Bronch performed 5/20 Transitioned to trach collar.  5/22 back on vent full time 5/24 back on antibiotics for fevers 6/1 Initiating PSV weans 6/3 Large cuff leak, trach exchange 6/7 transitioned to [facility] 6/9 remains on TC continuously since 6/7  Consultants  . Palliative care . Wound care . Interventional Radiology . Infectious Disease . Nephrology . Neurosurgery . ENT . PCCM  Procedures  . Mechanical Ventilation . Tracheostomy . Trach exchange . Transition to trach collar . Flexible laryngeal endoscopy - no source of bleeding seen. . Bronchoscopy . Tunnelled catheter  placement by IR . HD  Antibiotics   Anti-infectives (From admission, onward)   Start     Dose/Rate Route Frequency Ordered Stop   03/07/20 1800  cefTAZidime (FORTAZ) 2 g in sodium chloride 0.9 % 100 mL IVPB     Discontinue     2 g 200 mL/hr over 30 Minutes Intravenous Every T-Th-Sa (1800) 03/06/20 1255     03/07/20 1200  vancomycin (VANCOCIN) IVPB 500 mg/100 ml premix     Discontinue     500 mg 100 mL/hr over 60 Minutes Intravenous Every T-Th-Sa (Hemodialysis) 03/06/20 1255     03/04/20 2000  cefTAZidime (FORTAZ) 2 g in sodium chloride 0.9 % 100 mL IVPB  Status:  Discontinued        2 g 200 mL/hr over 30 Minutes Intravenous Every M-W-F (2000) 03/01/20 1136 03/06/20 1255   03/02/20 1200  vancomycin (VANCOCIN) IVPB 500 mg/100 ml premix        500 mg 100 mL/hr over 60 Minutes Intravenous Every T-Th-Sa (Hemodialysis) 03/01/20 1131 03/02/20 1710   03/01/20 1230  vancomycin (VANCOCIN) IVPB 500 mg/100 ml premix  Status:  Discontinued        500 mg 100 mL/hr over 60 Minutes Intravenous Every M-W-F (Hemodialysis) 03/01/20 1131 03/06/20 1255   02/28/20 1110  vancomycin (VANCOREADY) IVPB 500 mg/100 mL        over 60 Minutes  Continuous PRN 02/28/20 1112 02/28/20 1110   02/28/20 1101  vancomycin (VANCOCIN) 1-5 GM/200ML-% IVPB  Status:  Discontinued       Note to Pharmacy: Lytle Butte   : cabinet override      02/28/20 1101 02/28/20 1327   02/27/20 1800  cefTAZidime (FORTAZ) 1 g in sodium chloride 0.9 % 100 mL IVPB        1  g 200 mL/hr over 30 Minutes Intravenous Every 24 hours 02/27/20 0947 03/03/20 1738   02/27/20 1200  vancomycin (VANCOCIN) IVPB 500 mg/100 ml premix        500 mg 100 mL/hr over 60 Minutes Intravenous Once 02/27/20 0829 02/27/20 1343   02/24/20 1800  vancomycin (VANCOREADY) IVPB 500 mg/100 mL        500 mg 100 mL/hr over 60 Minutes Intravenous  Once 02/24/20 1308 02/24/20 1905   02/22/20 2200  metroNIDAZOLE (FLAGYL) tablet 500 mg     Discontinue     500 mg Per Tube Every  8 hours 02/22/20 0957 04/04/20 2359   02/22/20 1700  cefTRIAXone (ROCEPHIN) 2 g in sodium chloride 0.9 % 100 mL IVPB  Status:  Discontinued        2 g 200 mL/hr over 30 Minutes Intravenous Every 24 hours 02/22/20 0957 02/27/20 0947   02/22/20 1200  vancomycin (VANCOCIN) IVPB 500 mg/100 ml premix  Status:  Discontinued        500 mg 100 mL/hr over 60 Minutes Intravenous Every T-Th-Sa (Hemodialysis) 02/20/20 1253 02/21/20 1040   02/21/20 1431  vancomycin variable dose per unstable renal function (pharmacist dosing)  Status:  Discontinued         Does not apply See admin instructions 02/21/20 1431 03/01/20 1131   02/21/20 1200  vancomycin (VANCOREADY) IVPB 500 mg/100 mL        500 mg 100 mL/hr over 60 Minutes Intravenous  Once 02/21/20 0956 02/21/20 1253   02/21/20 0714  vancomycin (VANCOCIN) 500-5 MG/100ML-% IVPB  Status:  Discontinued       Note to Pharmacy: Ashley Akin   : cabinet override      02/21/20 0714 02/21/20 1346   02/20/20 1400  vancomycin (VANCOREADY) IVPB 1250 mg/250 mL        1,250 mg 166.7 mL/hr over 90 Minutes Intravenous  Once 02/20/20 1253 02/20/20 1538   02/20/20 1200  levofloxacin (LEVAQUIN) IVPB 500 mg  Status:  Discontinued        500 mg 100 mL/hr over 60 Minutes Intravenous Every 48 hours 02/20/20 1020 02/21/20 0943   02/15/20 1545  vancomycin (VANCOCIN) IVPB 1000 mg/200 mL premix  Status:  Discontinued       "Followed by" Linked Group Details   1,000 mg 200 mL/hr over 60 Minutes Intravenous Every 24 hours 02/14/20 1530 02/14/20 1745   02/15/20 0330  ceFEPIme (MAXIPIME) 2 g in sodium chloride 0.9 % 100 mL IVPB  Status:  Discontinued        2 g 200 mL/hr over 30 Minutes Intravenous Every 12 hours 02/14/20 1531 02/14/20 1531   02/14/20 1531  ceFEPIme (MAXIPIME) 2 g in sodium chloride 0.9 % 100 mL IVPB  Status:  Discontinued        2 g 200 mL/hr over 30 Minutes Intravenous Every 12 hours 02/14/20 1531 02/14/20 1745   02/14/20 1530  ceFEPIme (MAXIPIME) 2 g in  sodium chloride 0.9 % 100 mL IVPB  Status:  Discontinued        2 g 200 mL/hr over 30 Minutes Intravenous  Once 02/14/20 1520 02/14/20 1531   02/14/20 1530  vancomycin (VANCOREADY) IVPB 1500 mg/300 mL  Status:  Discontinued       "Followed by" Linked Group Details   1,500 mg 150 mL/hr over 120 Minutes Intravenous  Once 02/14/20 1530 02/14/20 1745     .   Subjective  The patient is resting comfortably. No new complaints.  Objective  Vitals:  Vitals:   03/15/20 1600 03/15/20 1630  BP: 95/66 (!) (P) 97/48  Pulse: 65 (P) 73  Resp: (!) 39 (!) (P) 40  Temp:    SpO2:     Exam:  Constitutional:  . No acute distress.  Neck:  . neck appears normal, no masses, normal ROM, supple . no thyromegaly . Trach collar in place. Respiratory:  . No increased work of breathing. . No wheezes, rales, or rhonchi . No tactile fremitus . Coarse breath sounds bilaterally Cardiovascular:  . Regular rate and rhythm . No murmurs, ectopy, or gallups. . No lateral PMI. No thrills. Abdomen:  . Abdomen is soft, non-tender, non-distended . No hernias, masses, or organomegaly . Normoactive bowel sounds.  Musculoskeletal:  . No cyanosis, clubbing . Mild edema Skin:  . No rashes, lesions, ulcers . palpation of skin: no induration or nodules Neurologic:  . Patient is unable to cooperate with exam. Psychiatric:  Patient is unable to cooperate with exam.  I have personally reviewed the following:   Today's Data  . Vitals, BMP, CBC, Glucoses  Micro Data  . Blood cultures x 2 (03/03/2020) No growth  Scheduled Meds: . apixaban  2.5 mg Per Tube BID  . chlorhexidine gluconate (MEDLINE KIT)  15 mL Mouth Rinse BID  . Chlorhexidine Gluconate Cloth  6 each Topical Q0600  . Chlorhexidine Gluconate Cloth  6 each Topical Q0600  . Chlorhexidine Gluconate Cloth  6 each Topical Q0600  . feeding supplement (PRO-STAT SUGAR FREE 64)  30 mL Per Tube BID  . insulin aspart  0-6 Units Subcutaneous Q4H  .  insulin aspart  2 Units Subcutaneous Q4H  . levothyroxine  25 mcg Per Tube Q0600  . mouth rinse  15 mL Mouth Rinse 10 times per day  . metoprolol tartrate  12.5 mg Per Tube BID  . metroNIDAZOLE  500 mg Per Tube Q8H  . nutrition supplement (JUVEN)  1 packet Per Tube BID BM  . pantoprazole sodium  40 mg Per Tube Daily  . vancomycin  500 mg Intravenous Q T,Th,Sa-HD   Continuous Infusions: . sodium chloride Stopped (03/09/20 1302)  . cefTAZidime (FORTAZ)  IV 2 g (03/14/20 1717)  . 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  Status post trach due to respiratory failure from Covid pneumonia.He ison trach collar.PCCM following. Most recently pneumonia has been multidrug-resistant Pseudomonas for which she was treated with Avycaz and mycin nebulizers at [facility]. Possible ongoing aspiration also. Presented with hemoptysis, aspiration of blood. Patient has cuffed trach placed by ENT.No active bleeding on bronchoscopy. He also underwent tracheostomy tube change by PCCM for persistent leak on 03/01/20. Chest x-ray done on 03/03/2020 showed bilateral pleural effusions, stable extensive opacity throughout the left hemithorax. Currently he is on trach collar, requiring 8 L of oxygen per minute. He continues to receive duonebs and CPT. Volume control per HD. The patient has been taken back to HD this afternoon.  Paroxysmal A.  WYO:VZCHYIFOY rate is controlled. Anticoagulation  was discontinued due to suspected GI bleed. He is currently on low-dose Eliquis. Added low dose toprol xl 12.54m qd for improved rate control.  Sepsis/bacteremia:Enterococcus and Staphylococcus epidermidis bacteremia HD cath sepsis: TDC removed 5/26. Also has osteomyelitis fifth sacral segment. There is high suspicion for ongoing aspiration. MRI pelvis/sacrum showed osteomyelitis of fifth sacral segment, edema in the muscle around both hips and proximal thighs with possible myositis. ID had recommended treatment for 6 weeks for sacral osteomyelitis and staph epidermidis bacteremia with vancomycin, fortaz and metronidazole. IR consulted for bone biopsy but due to high risk it was not done. He has persistent leukocytosis. Continue to monitor CBC.  ESRD:on HD since mid March 2021. AKI is due to gent toxicity. [facility] S/P new Tunnel catheter placement on  6/2 by IR. HD today again. Now back to MWF.  Chronic normocytic anemia: Secondary to ESRD, critical illness. Has been transfused with PRBCs. Currently hemoglobin stable. Continue to monitor  Unstageable sacral pressure ulcer/osteomyelitis:Wound care following. Discussion as above.  Cachexia/severe protein malnutrition:On tube feeds. Nutrition following.  Mild elevated LFTs:Continue to monitor.  Severe Protein Calorie Malnutrition: Due to chronic illness. Nutrition consulted.  Pressure Injury 02/14/20 Sacrum Medial Stage 4 - Full thickness tissue loss with exposed bone, tendon or muscle. (Active)  02/14/20 1906  Location: Sacrum  Location Orientation: Medial  Staging: Stage 4 - Full thickness tissue loss with exposed bone, tendon or muscle.  Wound Description (Comments):   Present on Admission: Yes    Pressure Injury 03/03/20 Neck Anterior Stage 3 - Full thickness tissue loss. Subcutaneous fat may be visible but bone, tendon or muscle are NOT exposed. Device related injury under trach tube. (Active)  03/03/20 1805    Location: Neck  Location Orientation: Anterior  Staging: Stage 3 - Full thickness tissue loss. Subcutaneous fat may be visible but bone, tendon or muscle are NOT exposed.  Wound Description (Comments): Device related injury under trach tube.   Present on Admission: No   I have seen and examined this patient myself. I have spent 34 minutes in his evaluation and care.  DVT Prophylaxis: Eliquis  CODE STATUS: DNR. 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 We have  requested for palliativecare evaluation.plz see note-trying to have discussion with  patient  Per palliative-family had a meeting and that they wish to continue all  aggressive cares, including vent and dialysis. Per palliative care on 6/14 family is  not interested in meeting with them anymore and have no interest in de-escalating  care. Palliative care has signed off.  Family Communication: None available. Disposition: Patient is from [place] Anticipate discharge to LTAC/SNF but due to lack of insurance, placement has been difficult. Social working aSports administrator  Disposition Plan:  Status is: Inpatient Remains inpatient appropriate because:Unsafe d/c plan Dispo: The patient is from:[facility] Anticipated d/c is to: SNF/LTAC Anticipated d/c date is: Unknown Barriers to discharge: funding for LTAC placement/SNf placement  Shane Loftus, DO [facility] Direct contact: see [email or web address]  7PM-7AM contact night coverage as above 03/15/2020, 5:15 PM  LOS: 28 days   ADDENDUM: I was contacted by nursing at [facility] me that the patient had been returned to the floor from HD. Apparently while there the patient desaturated into the mid-fifties. RT was called. They confirmed that the trach was operable and in good position. The patient's O2 was increased and they were able to increase his sat's  before he was brought back to  the floor. However, once he got up to the floor his oxygen saturations were in the 70's again. I came to see the patient. He was moribund. His eyes are open, but he is unresponsive. Eyes are sharply deviated to the right. No dolls eyes reflex.  He has gaped mouth gasping respirations. Lung sounds are actually good with fair air entry and no wheezes, rales, or rhonchi. Heart sounds are tachycardic. Abdomen is soft, non-tender, non-distended. Extremities are cachectic without edema, clubbing or cyanosis.  Stat pCXR has been ordered as has stat CT head and stat ABG.  I called and spoke with the patient's family including his mother, his sister, Shane Banks, and his brother Shane Banks. They asked if he would be alive in the morning and could then come to see him then. I told them that he was actively dying and that there were no guarantees he was going to make it through the night. I told them that he was profoundly hypoxic and unresponsive. I told them that it was my impressino that the patient had suffered a catastrophic stroke. They told me that they wanted the patient to be made a full code. I strongly advised against them explaining that coding the patient and putting him on a ventilator would not return their brother to them. All three family members have insisted that he be made a full code.   I have a consult into pulmonology, and am awaiting their call back and the result of the ABG.  I have spent an additional 45 minutes in the care of this patient.

## 2020-03-15 NOTE — Significant Event (Signed)
Rapid Response Event Note  Overview: Acute Hypoxia with tachypnea  Initial Focused Assessment: Addendum to earlier note, Continued event from 645pm. After Dr. Benny Lennert clarified code status and changed to full code, pt was placed on ventilator per existing trach at previous settings per RT. PCCM consulted by Dr. Benny Lennert. ICU bed requested. Unknown neurological baseline. Pt is responsive to noxious stimuli, PERRLA 3/3, grossly edematous with skin wounds. BBS Coarse Rhonchi with thick white secrections. BP soft 77/48. NS 250 cc bolus initiated until PCCM arrives to see pt 1945- 99.6 F rectal, HR 109 aflutter, 77/48 (58), RR 15 on ventilator at 100% Fio2 with sats 99%.   Pt transported to 3M12 via bed, monitor, IV pump and ventilator without complication.  Interventions: -PIV x 2 -pt placed on ventilator at previous PRVC settings per RT -NS 250cc bolus -Norepinephrine gtt at 72mcg -Tx to ICU   Event Summary: Call received [phone] Arrived at call [place] Call ended [phone]  Madelynn Done

## 2020-03-15 NOTE — Progress Notes (Signed)
[facility] Progress Note  Subjective: pt in room, opens eyes to voice. RR 30's, BP's wnl.   Vitals:   03/14/20 2248 03/14/20 2330 03/15/20 0322 03/15/20 0759  BP: 129/70     Pulse: 91 86 79 89  Resp: (!) 25 (!) 35 (!) 30 (!) 32  Temp: 99 F (37.2 C)     TempSrc: Axillary     SpO2: 95% 94% 95% 92%  Weight:      Height:        Exam:   on HD, in bed, sleepy  no jvd  Chest cta bilat  Cor reg no RG  Abd soft ntnd no ascites, +llq ostomy, +peg tube   Ext no LE edema   Nonfocal, +gen'd weakness         Summary: came from [facility] to [facility] after COVID hospitalization which included mech ventilation and trach placement. At [facility] had severe infections/ bacteremia/ PNA and developed AKI felt due to IV gent toxicity requiring initiation of HD 12/16/19. Admitted to ICU [facility] for trach bleed on 5/19. Was getting MWF HD at [facility] prior to admit here.   OP HD: started March 2021 at [facility] as above MWF   Getting HD here 3.5- 4h, UF 0.5- 1.5 L, hep 2000 prn   admit 62.8kg > range 56.9- 64.7kg here  Assessment/ Plan: 1. 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 Per CCM.  Still has secretion burden and requires suctioning, not a candidate for decannulation at this time.  RR remains in the 30's.   2. Enterococcus/ MRSE bacteremia HD cath sepsis - TDC removed 5/26, per ID getting 6 wks vanc/ fortaz/ flagyl for polymicrobial cath sepsis and sacral osteomyelitis.  3. Sacral osteo/ decub stage IV - IV abx as per #2 4. ESRD/ AKI - on HD since mid March 2021, d/t gent toxicity. HD MWF at [facility]. S/P new TDC 6/2 by IR. HD today to get back on MWF schedule.  5. BP/vol - no sig vol excess now by exam, wt's up but unable to UF on HD. Keep even w/ HD today.  6. Trach bleed - resolved 7. Tracheobronchitis - sp course of doxycycline 8. AMS - variable, seems overall improved 9. Atrial fib - per primary, on eliquis 2.5 BID 10. Anemia  ckd - transfuse prn, Hb 9.6. 11. Hypercalcemia:  w/ low PTH, prob from immobility, corrCa 11.0 > 10.5>10.3, improved. Cont low 2.0 Ca++ bath , PTH 29. If recurs will consider pamidronate or similar. 12. SP peg/ divert colostomy 13. SP COVID pna - original problem in Feb at OSH 14. DNR/ poor prognosis-  LTACH only option for disposition given needs but apparently not a candidate. Family asked not to be contacted by PCT anymore. PCT have now signed off.  For now cont plan of care.    Shane Banks 03/15/2020, 10:02 AM   Recent Labs  Lab 03/12/20 0728 03/13/20 0351 03/14/20 0457 03/15/20 0304  K 4.6  --  3.9 3.7  BUN 188*  --  134* 57*  CREATININE 4.44*  --  3.60* 2.23*  CALCIUM 10.6*  --  9.8 9.0  PHOS 4.9*  --   --   --   HGB  --    < > 9.4* 9.8*   < > = values in this interval not displayed.   Inpatient medications: . apixaban  2.5 mg Per Tube BID  . chlorhexidine gluconate (MEDLINE KIT)  15  mL Mouth Rinse BID  . Chlorhexidine Gluconate Cloth  6 each Topical Q0600  . Chlorhexidine Gluconate Cloth  6 each Topical Q0600  . Chlorhexidine Gluconate Cloth  6 each Topical Q0600  . feeding supplement (PRO-STAT SUGAR FREE 64)  30 mL Per Tube BID  . insulin aspart  0-6 Units Subcutaneous Q4H  . insulin aspart  2 Units Subcutaneous Q4H  . levothyroxine  25 mcg Per Tube Q0600  . mouth rinse  15 mL Mouth Rinse 10 times per day  . metoprolol tartrate  12.5 mg Per Tube BID  . metroNIDAZOLE  500 mg Per Tube Q8H  . nutrition supplement (JUVEN)  1 packet Per Tube BID BM  . pantoprazole sodium  40 mg Per Tube Daily  . vancomycin  500 mg Intravenous Q T,Th,Sa-HD   . sodium chloride Stopped (03/09/20 1302)  . cefTAZidime (FORTAZ)  IV 2 g (03/14/20 1717)  . feeding supplement (NEPRO CARB STEADY) 50 mL/hr at 03/14/20 1700   sodium chloride, acetaminophen (TYLENOL) oral liquid 160 mg/5 mL, docusate, fentaNYL (SUBLIMAZE) injection, heparin, ipratropium-albuterol, metoprolol tartrate, midazolam,  midazolam, polyethylene glycol

## 2020-03-15 NOTE — Significant Event (Signed)
NAME:  Shane Banks, MRN:  [id number], DOB:  22-Feb-1946, LOS: 2 ADMISSION DATE:  02/14/2020, CONSULTATION DATE:  03/15/20 REFERRING MD:  Karie Kirks, DO  CHIEF COMPLAINT:  Desaturation, increase WOB   Brief History   Mr. Shane Banks is a 74 year old male with past medical history of chronic hypoxic respiratory failure status post tracheostomy, ESRD on dialysis, atrial fibrillation, nonhemorrhagic stroke who was admitted from [place] for the evaluation of trach site bleeding. Patient had recent history of Covid pneumonia and was on ventilator because he progressed to ARDS. Because of little improvement in the ventilatory and oxygenation, he underwent tracheostomy and was discharged to [facility]. He had recent history of UTI, multidrug-resistant Pseudomonas pneumonia, stenotrophomonas bacteremia. Because of sepsis and shock, he developed renal failure requiring dialysis. On 5/19, he developed hemoptysis and was transferred to [facility] ED for evaluation. PCCM, ID, Nephrology following here. 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 He also underwent tracheostomy tube change by PCCM for persistent leak. Due to lack of insurance, he cannot go back to [employer]. Social worker assisting on placement. Anticipate prolonged hospitalization.  1/8 Admit to [facility] for COVID PNA 2/23 Transfer to [facility] on vent 5/19 Transfer to [facility] for hemoptysis. #6 cuffed trach placed. Back on vent. Bronch performed 5/20 Transitioned to trach collar.  5/22 back on vent full time 5/24 back on antibiotics for fevers 6/1 Initiating PSV weans 6/3 Large cuff leak, trach exchange 6/7 transitioned to [facility] 6/9 remains on TC continuously since 6/7  History of present illness   Asked to see patient for hypotension and worsening hypoxemia.   Per nursing and RT, the patient began desaturating to 86% while at dialysis.  He was suctioned of moderate amount thick tan secretions,  and FiO2 was increased to 55% on venti mask with improvement in sats to 93%.  Dialysis was terminated, and no fluid removed.  Upon return to room, he was noted with increased WOB, and again desaturation < 70% and tachypneic. diaphoretic.  The patient was evaluated by primary team, and ordered stat CXR, head CT and ABG.  She also spoke with the family who decided to change his code status from DNR to full code, and requested he be put back on ventilator.  CT head showed chronic small vessel ischemia, likely remote lacunar infarcts in the bilateral basal ganglia.  CXR showed slight worsening of left greater than right lung airspace disease.   Past Medical History   Past Medical History:  Diagnosis Date  . Acute on chronic respiratory failure with hypoxia ([place])   . Atrial fibrillation with RVR ([place])   . BPH (benign prostatic hyperplasia)   . COVID-19 virus infection   . Pneumonia due to COVID-19 virus   . Severe sepsis ([place])   . Stroke [facility])    Significant Hospital Events     Consults:   Palliative care  Wound care  Interventional Radiology  Infectious Disease  Nephrology  Neurosurgery  ENT  PCCM  Procedures:   Mechanical Ventilation  Tracheostomy  Trach exchange  Transition to trach collar  Flexible laryngeal endoscopy - no source of bleeding seen.  Bronchoscopy  Tunnelled catheter placement by IR  HD  Significant Diagnostic Tests:  CT head 6/18: IMPRESSION: 1. No acute hemorrhage. 2. Generalized atrophy and chronic small vessel ischemia, age advanced. Lacunar infarcts in the bilateral basal ganglia, technically indeterminate but likely remote. No other evidence of acute ischemia.  CXR 6/18: IMPRESSION: Stable to slight worsening of  left greater than right lung airspace disease.  Micro Data:  Blood 6/6:  No growth Blood 5/27:  No growth Blood 5/25:  Enterococcus faecalis Blood 5/22:  Staph epidermidis   Antimicrobials:   Ceftazidime Vancomycin  Interim history/subjective:  He was placed on vent setting PRVC, and appears more comfortable.   Objective   Blood pressure 91/67, pulse (!) 102, temperature 99.9 F (37.7 C), temperature source Axillary, resp. rate (!) 0, height 5\' 9"  (1.753 m), weight 59.5 kg, SpO2 100 %.    Vent Mode: PRVC FiO2 (%):  [40 %-99 %] 70 % Set Rate:  [15 bmp] 15 bmp Vt Set:  [480 mL] 480 mL PEEP:  [5 cmH20] 5 cmH20 Plateau Pressure:  [31 cmH20-33 cmH20] 31 cmH20   Intake/Output Summary (Last 24 hours) at 03/16/2020 0035 Last data filed at 03/16/2020 0000 Gross per 24 hour  Intake 392.1 ml  Output 450 ml  Net -57.9 ml   Filed Weights   03/14/20 1100 03/15/20 1740 03/15/20 2145  Weight: 61.6 kg 59.8 kg 59.5 kg   Examination: General:  Acutely and chronically ill appearing male who is not responsive, eyes closed, not following commands HENT: trach shiley in place, connected to ventilator Lungs: Not labored at this time, not breathing over the vent, coarse breath sounds bilaterally Cardiovascular: S1S2 present, RRR, no audible murmur or gallops Abdomen: soft, non-tender, non-distended, +BS Extremities: trace edema, no erythema Neuro: Non-verbal, not following commands, eyes closed  Resolved Hospital Problem list     Assessment & Plan:   Acute on chronic hypoxemic respiratory failure 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  New acute respiratory failure may be due to mucous plugging vs new lung infection.  His Tmax is 100.4 with persistent leukocytosis ~ 21K.  Patient has cuffed trach placed by ENT.No active bleeding on bronchoscopy. He also underwent tracheostomy tube change by PCCM for persistent  leak on 03/01/20.  Upgrade to ICU  Ventilatory support  Suction frequently as needed  Send deep tracheal sputum sample  Continue Ceftazidime, Vancomycin and Metronidazole for now  PAF Appears to be in sinus at this time  Continue Metoprolol 12.5mg  BID  Metoprolol 5mg  IV prn for RVR  Continue Eliquis 2.5mg  BID  Sepsis/ Enterococcus faecalis and Staph epidermidis Bacteremia via HD cath [facility] removed 5/26. Also has osteomyelitis fifth sacral segment. There is high suspicion for ongoing aspiration. MRI pelvis/sacrum showed osteomyelitis of fifth sacral segment, edema in the muscle around both hips and proximal thighs with possible myositis. ID had recommended treatment for 6 weeks for sacral osteomyelitis and staph epidermidis bacteremia with vancomycin, fortaz and metronidazole. IR consulted for bone biopsy but due to high risk it was not done. He has persistent leukocytosis.  Most recent blood cultures showed no growth.  Continue Ceftazidime, renally dosing  Continue Vancomycin, renally dosing  Continue Metronidazole  ESRD on HD since March 2021 AKI is due to gent toxicity. HD MWF at [facility]. S/P new Tunnel catheter placement on 6/2 by IR.  Continue iHD MWF  Consider Midodrine before dialysis to support BP  Neprophology on board  Unstageable sacral decubitus ulcer/osteomyelitis  Wound care following   Protein malnutrition  Continue tube feeding  Continue Juven packet BID  Nutrition consult  Severe deconditioning  Due to prolonged  critical illness  PT/OT  Best practice:  Diet: NPO, tube feed Pain/Anxiety/Delirium protocol (if indicated): none  VAP protocol (if indicated): trach suctioning, HOB > 30 deg, oral care DVT prophylaxis: on apixaban GI prophylaxis: protonix  Glucose control:  SSI Mobility: bedridden Code Status:  Changed from DNR to Full by family Family Communication:  Updated by Dr. Benny Lennert today Disposition:  Difficult placement due to lack of  insurance, anticipate discharge to [facility]  Labs   CBC: Recent Labs  Lab 03/11/20 0236 03/11/20 0236 03/12/20 0427 03/13/20 0351 03/14/20 0457 03/15/20 0304 03/15/20 2204  WBC 21.1*  --  20.3* 18.2* 22.1* 21.4*  --   NEUTROABS 15.2*  --  15.0* 12.6* 16.8* 17.1*  --   HGB 9.5*   < > 9.8* 9.5* 9.4* 9.8* 10.5*  HCT 30.9*   < > 31.8* 31.1* 30.0* 32.2* 31.0*  MCV 84.0  --  83.2 84.7 83.3 86.1  --   PLT 272  --  264 222 227 241  --    < > = values in this interval not displayed.    Basic Metabolic Panel: Recent Labs  Lab 03/09/20 1110 03/09/20 1110 03/11/20 0236 03/12/20 0728 03/14/20 0457 03/15/20 0304 03/15/20 2204  NA 138   < > 138 138 140 135 138  K 3.9   < > 4.2 4.6 3.9 3.7 3.5  CL 99  --  98 97* 98 97*  --   CO2 26  --  29 26 27 28   --   GLUCOSE 154*  --  136* 170* 172* 178*  --   BUN 150*  --  127* 188* 134* 57*  --   CREATININE 3.89*  --  3.28* 4.44* 3.60* 2.23*  --   CALCIUM 10.3  --  10.1 10.6* 9.8 9.0  --   MG  --   --   --   --   --  2.2  --   PHOS  --   --   --  4.9*  --   --   --    < > = values in this interval not displayed.   GFR: Estimated Creatinine Clearance: 25.2 mL/min (A) (by C-G formula based on SCr of 2.23 mg/dL (H)). Recent Labs  Lab 03/12/20 0427 03/13/20 0351 03/14/20 0457 03/15/20 0304  WBC 20.3* 18.2* 22.1* 21.4*    Liver Function Tests: Recent Labs  Lab 03/12/20 0728  ALBUMIN 2.1*   No results for input(s): LIPASE, AMYLASE in the last 168 hours. No results for input(s): AMMONIA in the last 168 hours.  ABG    Component Value Date/Time   PHART 7.334 (L) 03/15/2020 2204   PCO2ART 58.2 (H) 03/15/2020 2204   PO2ART 121 (H) 03/15/2020 2204   HCO3 30.8 (H) 03/15/2020 2204   TCO2 33 (H) 03/15/2020 2204   ACIDBASEDEF 0.4 12/17/2019 1809   O2SAT 98.0 03/15/2020 2204    Coagulation Profile: No results for input(s): INR, PROTIME in the last 168 hours.  Cardiac Enzymes: No results for input(s): CKTOTAL, CKMB, CKMBINDEX,  TROPONINI in the last 168 hours.  HbA1C: Hgb A1c MFr Bld  Date/Time Value Ref Range Status  02/17/2020 03:43 AM 5.5 4.8 - 5.6 % Final    Comment:    (NOTE)         Prediabetes: 5.7 - 6.4         Diabetes: >6.4         Glycemic control for adults with diabetes: <7.0   11/30/2019 06:56 AM  6.0 (H) 4.8 - 5.6 % Final    Comment:    (NOTE) Pre diabetes:          5.7%-6.4% Diabetes:              >6.4% Glycemic control for   <7.0% adults with diabetes     CBG: Recent Labs  Lab 03/15/20 0823 03/15/20 1127 03/15/20 1818 03/15/20 2139 03/16/20 0014  GLUCAP 139* 168* 168* 180* 171*    Review of Systems:   Unable to obtain, patient not responsive.  Past Medical History  He,  has a past medical history of Acute on chronic respiratory failure with hypoxia ([place]), Atrial fibrillation with RVR ([place]), BPH (benign prostatic hyperplasia), COVID-19 virus infection, Pneumonia due to COVID-19 virus, Severe sepsis ([place]), and Stroke ([place]).   Surgical History    Past Surgical History:  Procedure Laterality Date  . IR FLUORO GUIDE CV LINE RIGHT  12/15/2019  . IR FLUORO GUIDE CV LINE RIGHT  02/28/2020  . IR THORACENTESIS ASP PLEURAL SPACE W/IMG GUIDE  12/21/2019  . IR US GUIDE VASC ACCESS RIGHT  12/15/2019  . IR US GUIDE VASC ACCESS RIGHT  02/28/2020    Social History  Unable to obtain, non-verbal.  Family History   Unable to obtain, non-verbal.  Allergies Allergies  Allergen Reactions  . Aspirin Rash  . Cephalexin Other (See Comments)    dizziness  . Penicillins     Itching,swelling     Home Medications  Prior to Admission medications   Medication Sig Start Date End Date Taking? Authorizing Provider  amLODipine (NORVASC) 10 MG tablet Take 10 mg by mouth daily.   Yes [provider]  ascorbic acid (VITAMIN C) 500 MG tablet Take 500 mg by mouth 2 (two) times daily.   Yes [provider]  cholecalciferol (VITAMIN D3) 25 MCG (1000 UNIT) tablet Take 1,000 Units by mouth  daily.   Yes [provider]  guaifenesin (HUMIBID E) 400 MG TABS tablet Take 400 mg by mouth in the morning, at noon, and at bedtime.   Yes [provider]  levothyroxine (SYNTHROID) 25 MCG tablet Take 25 mcg by mouth daily before breakfast.   Yes [provider]  melatonin 3 MG TABS tablet Take 3 mg by mouth at bedtime.   Yes [provider]  nutrition supplement, JUVEN, (JUVEN) PACK Take 1 packet by mouth in the morning, at noon, and at bedtime.   Yes [provider]  Omega-3 Fatty Acids (FISH OIL) 1000 MG CAPS Take 1 capsule by mouth daily.   Yes [provider]  pantoprazole (PROTONIX) 40 MG tablet Take 40 mg by mouth 2 (two) times daily before a meal.   Yes [provider]  sertraline (ZOLOFT) 25 MG tablet Take 25 mg by mouth daily.   Yes [provider]  Specialty Vitamins Products (PROSTATE PO) Take 30 mLs by mouth daily.   Yes [provider]  tamsulosin (FLOMAX) 0.4 MG CAPS capsule Take 0.4 mg by mouth daily.   Yes [provider]   Critical care time:  45 minutes    ___________________ Samuella Cota. Reinaldo Berber, MD [facility]

## 2020-03-15 NOTE — Progress Notes (Signed)
Pt O2 sat noted between 87-89%; increased O2 from 10 to 15L and called Respiratory for assistance; rinsedback the pt and terminated HD tx while waiting for Respiratory.

## 2020-03-15 NOTE — Progress Notes (Signed)
Spoke with CCM NP regarding trach change. NP agrees to wait 4 weeks for routine trach change. Lurline Idol change will be due on 03/29/20.

## 2020-03-15 NOTE — Progress Notes (Addendum)
  Speech Language Pathology Treatment: Nada Boozer Speaking valve  Patient Details Name: Shane Banks MRN: [id number] DOB: 15-Apr-1946 Today's Date: 03/15/2020 Time: 6440-3474 SLP Time Calculation (min) (ACUTE ONLY): 10 min  Assessment / Plan / Recommendation Clinical Impression  Pt demonstrates decreased responsiveness today. Pt sleeping on arrival, opens eyes to stim, but otherwise no interaction. SLP sat pt upright , performed oral care given severe white coating on lingual surface, pt left mouth open, but no response otherwise. With PMSV in place pt had immediate desaturation to 87, return to 95 with removal and laying down. PMSV placed again with desat. Suctioned posterior oral cavity. Will f/u for further trials, but pt not appropriate for swallow eval today and not tolerating PMSV as well as no attempt to communicate or interact. Will f/u   HPI HPI: 74 yo m with PMHx of afib with RVR, BPH, stroke, and COVID ARDS in January requiring trach and LTACH placement at [facility]. Had been tolerating trach collar, but on 5/19 developed hemoptysis and hypoxemic respiratory failure requiring vent. Transferred to [facility] on 5/19. PMSV desired for discussion with pt regarding goals of care.      SLP Plan  Continue with current plan of care       Recommendations  Diet recommendations: NPO      Patient may use Passy-Muir Speech Valve: Intermittently with supervision PMSV Supervision: Intermittent         Oral Care Recommendations: Oral care QID Follow up Recommendations: LTACH Plan: Continue with current plan of care       GO                Jaquail Mclees, Katherene Ponto 03/15/2020, 2:17 PM

## 2020-03-15 NOTE — Progress Notes (Signed)
I was notified of pt with acute hypoxia and sats 50-70% after returning from HD. HD terminated due to earlier desat, no UF reportedly removed. Upon arrival, pt is obtunded, tachypneic and diaphoretic with coarse BBS. On ATC at 98% Fio2, his sats are 74%. He feels very hot to the touch. Needs a rectal temperature. Pt is DNR. Awaiting Dr. Benny Lennert to bedside and clarification of code status/escalation of care.

## 2020-03-16 LAB — GLUCOSE, CAPILLARY
Glucose-Capillary: 130 mg/dL — ABNORMAL HIGH (ref 70–99)
Glucose-Capillary: 140 mg/dL — ABNORMAL HIGH (ref 70–99)
Glucose-Capillary: 162 mg/dL — ABNORMAL HIGH (ref 70–99)
Glucose-Capillary: 171 mg/dL — ABNORMAL HIGH (ref 70–99)
Glucose-Capillary: 171 mg/dL — ABNORMAL HIGH (ref 70–99)
Glucose-Capillary: 174 mg/dL — ABNORMAL HIGH (ref 70–99)
Glucose-Capillary: 97 mg/dL (ref 70–99)

## 2020-03-16 LAB — CBC WITH DIFFERENTIAL/PLATELET
Abs Immature Granulocytes: 0.17 10*3/uL — ABNORMAL HIGH (ref 0.00–0.07)
Basophils Absolute: 0.1 10*3/uL (ref 0.0–0.1)
Basophils Relative: 0 %
Eosinophils Absolute: 0.1 10*3/uL (ref 0.0–0.5)
Eosinophils Relative: 1 %
HCT: 30.3 % — ABNORMAL LOW (ref 39.0–52.0)
Hemoglobin: 9 g/dL — ABNORMAL LOW (ref 13.0–17.0)
Immature Granulocytes: 1 %
Lymphocytes Relative: 8 %
Lymphs Abs: 1.7 10*3/uL (ref 0.7–4.0)
MCH: 25.6 pg — ABNORMAL LOW (ref 26.0–34.0)
MCHC: 29.7 g/dL — ABNORMAL LOW (ref 30.0–36.0)
MCV: 86.3 fL (ref 80.0–100.0)
Monocytes Absolute: 2.1 10*3/uL — ABNORMAL HIGH (ref 0.1–1.0)
Monocytes Relative: 10 %
Neutro Abs: 17.5 10*3/uL — ABNORMAL HIGH (ref 1.7–7.7)
Neutrophils Relative %: 80 %
Platelets: 200 10*3/uL (ref 150–400)
RBC: 3.51 MIL/uL — ABNORMAL LOW (ref 4.22–5.81)
RDW: 18.5 % — ABNORMAL HIGH (ref 11.5–15.5)
WBC: 21.7 10*3/uL — ABNORMAL HIGH (ref 4.0–10.5)
nRBC: 0 % (ref 0.0–0.2)

## 2020-03-16 NOTE — Progress Notes (Signed)
[facility] Progress Note  Subjective: +fever yest and resp distress, moved to ICU and back on ventilator.   Vitals:   03/16/20 1000 03/16/20 1100 03/16/20 1155 03/16/20 1230  BP: 91/60 (!) 88/51    Pulse: 88 100    Resp: 18 (!) 25    Temp:    99.3 F (37.4 C)  TempSrc:    Axillary  SpO2: 98% 100% 100%   Weight:      Height:        Exam:   In ICU on vent  no jvd  Chest coarse BS bilat  Cor reg no RG  Abd soft ntnd no ascites, +llq ostomy, +peg tube   Ext no LE edema   Nonfocal, +gen'd weakness         Summary: came from [facility] to [facility] after COVID hospitalization which included mech ventilation and trach placement. At [facility] had severe infections/ bacteremia/ PNA and developed AKI felt due to IV gent toxicity requiring initiation of HD 12/16/19. Admitted to ICU [facility] for trach bleed on 5/19. Was getting MWF HD at [facility] prior to admit here.   OP HD: started March 2021 at [facility] as above MWF   Getting HD here 3.5- 4h, UF 0.5- 1.5 L, hep 2000 prn   admit 62.8kg > range 56.9- 64.7kg here   CXR 6/18 - IMPRESSION: Stable to slight worsening of left greater than right lung airspace disease.   Assessment/ Plan: 1. 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  Got worse overnight and is back in ICU on the ventilator. DNR reversed to full code.  Is on 50% FiO2. CXR bilat infiltrates, continues on broad-spec IV abx.  2. Enterococcus/ MRSE bacteremia HD cath sepsis - TDC removed 5/26, per ID getting 6 wks vanc/ fortaz/ flagyl for polymicrobial cath sepsis and sacral osteomyelitis.  3. Sacral osteo/ decub stage IV - as per #2 4. ESRD/ AKI - on HD since mid March 2021, d/t gent toxicity. HD MWF at [facility]. S/P new TDC 6/2 by IR. HD Friday, next HD Monday.   5. BP/vol - no sig vol excess now by exam, wt's up but unable to UF on HD this wk 6. Trach bleed - resolved 7. Tracheobronchitis - sp course of  doxycycline 8. AMS - variable, seems overall improved 9. Atrial fib - per primary, on eliquis 2.5 BID 10. Anemia ckd - transfuse prn, Hb remains 9- 10 range. Has not required any ESA since admission in early May.  11. Hypercalcemia:  w/ low PTH, prob from immobility, corrCa 11.0 > 10.5>10.3, improved. Cont low 2.0 Ca++ bath , PTH 29. If recurs will consider pamidronate or similar. 12. SP peg/ divert colostomy 13. SP COVID pna - original problem in Feb at OSH 14. DNR/ poor prognosis-  LTACH only option for disposition given needs but apparently not a candidate. Family asked not to be contacted by PCT anymore. PCT have now signed off.  For now cont plan of care.    Rob Numa Schroeter 03/16/2020, 1:54 PM   Recent Labs  Lab 03/12/20 0728 03/13/20 0351 03/14/20 0457 03/14/20 0457 03/15/20 0304 03/15/20 0304 03/15/20 2204 03/16/20 0629  K 4.6  --  3.9   < > 3.7  --  3.5  --   BUN 188*  --  134*  --  57*  --   --   --   CREATININE 4.44*  --  3.60*  --  2.23*  --   --   --   CALCIUM 10.6*  --  9.8  --  9.0  --   --   --   PHOS 4.9*  --   --   --   --   --   --   --   HGB  --    < > 9.4*   < > 9.8*   < > 10.5* 9.0*   < > = values in this interval not displayed.   Inpatient medications: . apixaban  2.5 mg Per Tube BID  . chlorhexidine gluconate (MEDLINE KIT)  15 mL Mouth Rinse BID  . Chlorhexidine Gluconate Cloth  6 each Topical Q0600  . Chlorhexidine Gluconate Cloth  6 each Topical Q0600  . Chlorhexidine Gluconate Cloth  6 each Topical Q0600  . feeding supplement (PRO-STAT SUGAR FREE 64)  30 mL Per Tube BID  . insulin aspart  0-6 Units Subcutaneous Q4H  . insulin aspart  2 Units Subcutaneous Q4H  . levothyroxine  25 mcg Per Tube Q0600  . mouth rinse  15 mL Mouth Rinse 10 times per day  . metoprolol tartrate  12.5 mg Per Tube BID  . metroNIDAZOLE  500 mg Per Tube Q8H  . nutrition supplement (JUVEN)  1 packet Per Tube BID BM  . pantoprazole sodium  40 mg Per Tube Daily  . vancomycin  500  mg Intravenous Q T,Th,Sa-HD   . sodium chloride Stopped (03/09/20 1302)  . cefTAZidime (FORTAZ)  IV 2 g (03/14/20 1717)  . feeding supplement (NEPRO CARB STEADY) 50 mL/hr at 03/16/20 1100  . norepinephrine (LEVOPHED) Adult infusion 2 mcg/min (03/16/20 1100)   sodium chloride, acetaminophen (TYLENOL) oral liquid 160 mg/5 mL, docusate, fentaNYL (SUBLIMAZE) injection, heparin, ipratropium-albuterol, metoprolol tartrate, midazolam, midazolam, polyethylene glycol

## 2020-03-16 NOTE — Progress Notes (Signed)
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 Patient had recent history of Covid pneumonia and was on ventilator because he progressed to ARDS. Because of little improvement in the ventilatory and oxygenation, he underwent tracheostomy and was discharged to [facility]. He had recent history of UTI, multidrug-resistant Pseudomonas pneumonia, stenotrophomonas bacteremia. Because of sepsis and shock, he developed renal failure requiring dialysis. On 5/19, he developed hemoptysis and was transferred to [facility] ED for evaluation. PCCM,ID,Nephrology following here. 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 He also underwent tracheostomy tube change by PCCM for persistent leak. Due to lack of insurance, he cannot go back to [employer]. Social worker assisting on placement. Anticipate prolonged hospitalization.  1/8 Admit to [facility] for COVID PNA 2/23 Transfer to [facility] on vent 5/19 Transfer to [facility] for hemoptysis. #6 cuffed trach placed. Back on vent. Bronch performed 5/20 Transitioned to trach collar.  5/22 back on vent full time 5/24 back on antibiotics for fevers 6/1 Initiating PSV weans 6/3 Large cuff leak, trach exchange 6/7 transitioned to [facility] 6/9 remains on TC continuously since 6/7 03/15/2020: Pt returned to ICU for mechanical ventilation and pressors. Pt with sharply decreased saturations while in HD.  Consultants   Palliative care  Wound care  Interventional Radiology  Infectious Disease  Nephrology  Neurosurgery  ENT  PCCM  Procedures   Mechanical Ventilation  Tracheostomy  Trach  exchange  Transition to trach collar  Flexible laryngeal endoscopy - no source of bleeding seen.  Bronchoscopy  Tunnelled catheter placement by IR  HD  Antibiotics   Anti-infectives (From admission, onward)   Start     Dose/Rate Route Frequency Ordered Stop   03/07/20 1800  cefTAZidime (FORTAZ) 2 g in sodium chloride 0.9 % 100 mL IVPB     Discontinue     2 g 200 mL/hr over 30 Minutes Intravenous Every T-Th-Sa (1800) 03/06/20 1255     03/07/20 1200  vancomycin (VANCOCIN) IVPB 500 mg/100 ml premix     Discontinue     500 mg 100 mL/hr over 60 Minutes Intravenous Every T-Th-Sa (Hemodialysis) 03/06/20 1255     03/04/20 2000  cefTAZidime (FORTAZ) 2 g in sodium chloride 0.9 % 100 mL IVPB  Status:  Discontinued        2 g 200 mL/hr over 30 Minutes Intravenous Every M-W-F (2000) 03/01/20 1136 03/06/20 1255   03/02/20 1200  vancomycin (VANCOCIN) IVPB 500 mg/100 ml premix        500 mg 100 mL/hr over 60 Minutes Intravenous Every T-Th-Sa (Hemodialysis) 03/01/20 1131 03/02/20 1710   03/01/20 1230  vancomycin (VANCOCIN) IVPB 500 mg/100 ml premix  Status:  Discontinued        500 mg 100 mL/hr over 60 Minutes Intravenous Every M-W-F (Hemodialysis) 03/01/20 1131 03/06/20 1255   02/28/20 1110  vancomycin (VANCOREADY) IVPB 500 mg/100 mL        over 60 Minutes  Continuous PRN 02/28/20 1112 02/28/20 1110   02/28/20 1101  vancomycin (VANCOCIN) 1-5 GM/200ML-% IVPB  Status:  Discontinued       Note to Pharmacy: Lytle Butte   : cabinet override      02/28/20 1101 02/28/20 1327   02/27/20 1800  cefTAZidime (FORTAZ)  1 g in sodium chloride 0.9 % 100 mL IVPB        1 g 200 mL/hr over 30 Minutes Intravenous Every 24 hours 02/27/20 0947 03/03/20 1738   02/27/20 1200  vancomycin (VANCOCIN) IVPB 500 mg/100 ml premix        500 mg 100 mL/hr over 60 Minutes Intravenous Once 02/27/20 0829 02/27/20 1343   02/24/20 1800  vancomycin (VANCOREADY) IVPB 500 mg/100 mL        500 mg 100 mL/hr over 60 Minutes  Intravenous  Once 02/24/20 1308 02/24/20 1905   02/22/20 2200  metroNIDAZOLE (FLAGYL) tablet 500 mg     Discontinue     500 mg Per Tube Every 8 hours 02/22/20 0957 04/04/20 2359   02/22/20 1700  cefTRIAXone (ROCEPHIN) 2 g in sodium chloride 0.9 % 100 mL IVPB  Status:  Discontinued        2 g 200 mL/hr over 30 Minutes Intravenous Every 24 hours 02/22/20 0957 02/27/20 0947   02/22/20 1200  vancomycin (VANCOCIN) IVPB 500 mg/100 ml premix  Status:  Discontinued        500 mg 100 mL/hr over 60 Minutes Intravenous Every T-Th-Sa (Hemodialysis) 02/20/20 1253 02/21/20 1040   02/21/20 1431  vancomycin variable dose per unstable renal function (pharmacist dosing)  Status:  Discontinued         Does not apply See admin instructions 02/21/20 1431 03/01/20 1131   02/21/20 1200  vancomycin (VANCOREADY) IVPB 500 mg/100 mL        500 mg 100 mL/hr over 60 Minutes Intravenous  Once 02/21/20 0956 02/21/20 1253   02/21/20 0714  vancomycin (VANCOCIN) 500-5 MG/100ML-% IVPB  Status:  Discontinued       Note to Pharmacy: Ashley Akin   : cabinet override      02/21/20 0714 02/21/20 1346   02/20/20 1400  vancomycin (VANCOREADY) IVPB 1250 mg/250 mL        1,250 mg 166.7 mL/hr over 90 Minutes Intravenous  Once 02/20/20 1253 02/20/20 1538   02/20/20 1200  levofloxacin (LEVAQUIN) IVPB 500 mg  Status:  Discontinued        500 mg 100 mL/hr over 60 Minutes Intravenous Every 48 hours 02/20/20 1020 02/21/20 0943   02/15/20 1545  vancomycin (VANCOCIN) IVPB 1000 mg/200 mL premix  Status:  Discontinued       "Followed by" Linked Group Details   1,000 mg 200 mL/hr over 60 Minutes Intravenous Every 24 hours 02/14/20 1530 02/14/20 1745   02/15/20 0330  ceFEPIme (MAXIPIME) 2 g in sodium chloride 0.9 % 100 mL IVPB  Status:  Discontinued        2 g 200 mL/hr over 30 Minutes Intravenous Every 12 hours 02/14/20 1531 02/14/20 1531   02/14/20 1531  ceFEPIme (MAXIPIME) 2 g in sodium chloride 0.9 % 100 mL IVPB  Status:  Discontinued         2 g 200 mL/hr over 30 Minutes Intravenous Every 12 hours 02/14/20 1531 02/14/20 1745   02/14/20 1530  ceFEPIme (MAXIPIME) 2 g in sodium chloride 0.9 % 100 mL IVPB  Status:  Discontinued        2 g 200 mL/hr over 30 Minutes Intravenous  Once 02/14/20 1520 02/14/20 1531   02/14/20 1530  vancomycin (VANCOREADY) IVPB 1500 mg/300 mL  Status:  Discontinued       "Followed by" Linked Group Details   1,500 mg 150 mL/hr over 120 Minutes Intravenous  Once 02/14/20 1530 02/14/20 1745  Subjective  The patient is resting comfortably. Patient is sedated, ventilated. No family at bedside. Nursing confirms no family has visited this patient since last night.  Objective   Vitals:  Vitals:   03/16/20 1500 03/16/20 1551  BP: 130/85   Pulse: (!) 111   Resp: (!) 25   Temp:    SpO2: 100% 100%   Exam:  Constitutional:   Patient is sedated, intubated. Neck:   neck appears normal, no masses, normal ROM, supple  no thyromegaly  Trach collar in place. Respiratory:   No increased work of breathing.  No wheezes, rales, or rhonchi  No tactile fremitus  Coarse breath sounds bilaterally Cardiovascular:   Regular rate and rhythm  No murmurs, ectopy, or gallups.  No lateral PMI. No thrills. Abdomen:   Abdomen is soft, non-tender, non-distended  No hernias, masses, or organomegaly  Normoactive bowel sounds.  Musculoskeletal:   No cyanosis, clubbing  Mild edema Skin:   No rashes, lesions, ulcers  palpation of skin: no induration or nodules Neurologic:   Patient is unable to cooperate with exam. Psychiatric:  Patient is unable to cooperate with exam.  I have personally reviewed the following:   Today's Data   Vitals, CBC, Glucoses  Micro Data   Blood cultures x 2 (03/03/2020) No growth  Scheduled Meds:  apixaban  2.5 mg Per Tube BID   chlorhexidine gluconate (MEDLINE KIT)  15 mL Mouth Rinse BID   Chlorhexidine Gluconate Cloth  6 each Topical Q0600    Chlorhexidine Gluconate Cloth  6 each Topical Q0600   Chlorhexidine Gluconate Cloth  6 each Topical Q0600   feeding supplement (PRO-STAT SUGAR FREE 64)  30 mL Per Tube BID   insulin aspart  0-6 Units Subcutaneous Q4H   insulin aspart  2 Units Subcutaneous Q4H   levothyroxine  25 mcg Per Tube Q0600   mouth rinse  15 mL Mouth Rinse 10 times per day   metoprolol tartrate  12.5 mg Per Tube BID   metroNIDAZOLE  500 mg Per Tube Q8H   nutrition supplement (JUVEN)  1 packet Per Tube BID BM   pantoprazole sodium  40 mg Per Tube Daily   vancomycin  500 mg Intravenous Q T,Th,Sa-HD   Continuous Infusions:  sodium chloride Stopped (03/09/20 1302)   cefTAZidime (FORTAZ)  IV 2 g (03/14/20 1717)   feeding supplement (NEPRO CARB STEADY) 50 mL/hr at 03/16/20 1500   norepinephrine (LEVOPHED) Adult infusion Stopped (03/16/20 1129)    Principal Problem:   Enterococcal bacteremia Active Problems:   Acute respiratory failure (HCC)   Hemoptysis   Pressure injury of skin   Protein-calorie malnutrition, severe   Decubitus ulcer of sacral region, stage 4 (HCC)   Fever   Palliative care by specialist   Goals of care, counseling/discussion   DNR (do not resuscitate)   LOS: 31 days   A & P  Acute on chronic hypoxemic respiratory failure requiring tracheostomy, 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  Status post trach due to respiratory failure from Covid pneumonia.He ison trach collar.PCCM following. Most recently pneumonia has been multidrug-resistant Pseudomonas for which she was treated with Avycaz and mycin nebulizers at [facility]. Possible ongoing aspiration also. Presented with hemoptysis, aspiration of blood. Patient has cuffed trach placed by ENT.No active bleeding on bronchoscopy. He also underwent tracheostomy tube change by PCCM for persistent leak on 03/01/20. Chest x-ray done on 03/03/2020 showed bilateral pleural effusions, stable  extensive opacity throughout the left hemithorax. Currently he  is on trach collar, requiring 8 L of oxygen per minute. He continues to receive duonebs and CPT. Volume control per HD. The patient was been taken back to HD on the afternoon of 03/15/2020. He was returned to the floor in extremis with saturations in the 70's on NRB. Rapid Response called. Family rescinded DNR and stated that they (Mother, Sister, and brother) that they wished for him to be ventilated.  CVA: Last night on physical exam the patient appeared to have suffed a catastrophic CVA. Eyes were sharply deviated to the right, and he was unresponsive. Initial CT head was negative for new stroke, but did demonstrate an age indeterminate basal ganglia stroke. MRI brain has been ordered.  Paroxysmal A. YQM:VHQIONGEX rate is controlled. Anticoagulation was discontinued due to suspected GI bleed. He is currently on low-dose Eliquis. Added low dose toprol xl 12.25m qd for improved rate control.  Sepsis/bacteremia:Enterococcus and Staphylococcus epidermidis bacteremia HD cath sepsis: TDC removed 5/26. Also has osteomyelitis fifth sacral segment. There is high suspicion for ongoing aspiration. MRI pelvis/sacrum showed osteomyelitis of fifth sacral segment, edema in the muscle around both hips and proximal thighs with possible myositis. ID had recommended treatment for 6 weeks for sacral osteomyelitis and staph epidermidis bacteremia with vancomycin, fortaz and metronidazole. IR consulted for bone biopsy but due to high risk it was not done. He has persistent leukocytosis. Continue to monitor CBC.  Will repeat blood cultrues. Respiratory cultures from 03/16/2020 have had no growth.  ESRD:on HD since mid March 2021. AKI is due to gent toxicity. [facility] Tunnel catheter placement on  6/2 by IR. HD today again. Now back to MWF.  Chronic normocytic anemia: Secondary to ESRD, critical illness. Has been transfused with PRBCs.  Currently hemoglobin stable. Continue to monitor. No HD yesterday due to acute drop in saturations.  Unstageable sacral pressure ulcer/osteomyelitis:Wound care following. Discussion as above.  Cachexia/severe protein malnutrition:On tube feeds. Nutrition following.  Mild elevated LFTs:Continue to monitor.  Severe Protein Calorie Malnutrition: Due to chronic illness. Nutrition consulted.  Pressure Injury 02/14/20 Sacrum Medial Stage 4 - Full thickness tissue loss with exposed bone, tendon or muscle. (Active)  02/14/20 1906  Location: Sacrum  Location Orientation: Medial  Staging: Stage 4 - Full thickness tissue loss with exposed bone, tendon or muscle.  Wound Description (Comments):   Present on Admission: Yes    Pressure Injury 03/03/20 Neck Anterior Stage 3 - Full thickness tissue loss. Subcutaneous fat may be visible but bone, tendon or muscle are NOT exposed. Device related injury under trach tube. (Active)  03/03/20 1805  Location: Neck  Location Orientation: Anterior  Staging: Stage 3 - Full thickness tissue loss. Subcutaneous fat may be visible but bone, tendon or muscle are NOT exposed.  Wound Description (Comments): Device related injury under trach tube.   Present on Admission: No   I have seen and examined this patient myself. I have spent 38 minutes in his evaluation and care.  DVT Prophylaxis: Eliquis  CODE STATUS: Full Code. 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 We have  requested for palliativecare evaluation.plz see note-trying to have discussion with  patient  Per palliative-family had a meeting and that they wish to continue all aggressive cares,  including vent and dialysis. Per palliative care on 6/14 family is not interested in meeting with  them anymore and have no interest in de-escalating care. Palliative care has signed off.  Family Communication:  I spoke with  the family last night. They are not present today. Disposition: Patient is from [place]. Anticipate discharge to LTAC/SNF but due to lack of insurance, placement has been difficult. Social working Sports administrator.  Disposition Plan:  Status is: Inpatient Remains inpatient appropriate because:Unsafe d/c plan Dispo: The patient is from:[facility] Anticipated d/c is to: SNF/LTAC Anticipated d/c date is: Unknown Barriers to discharge: funding for LTAC placement/SNf placement  Plato Alspaugh, DO [facility] Direct contact: see [email or web address]  7PM-7AM contact night coverage as above 03/16/2020, 4:15 PM  LOS: 28 days

## 2020-03-17 LAB — CBC WITH DIFFERENTIAL/PLATELET
Abs Immature Granulocytes: 0.21 10*3/uL — ABNORMAL HIGH (ref 0.00–0.07)
Basophils Absolute: 0.1 10*3/uL (ref 0.0–0.1)
Basophils Relative: 0 %
Eosinophils Absolute: 0.3 10*3/uL (ref 0.0–0.5)
Eosinophils Relative: 2 %
HCT: 25.8 % — ABNORMAL LOW (ref 39.0–52.0)
Hemoglobin: 8.1 g/dL — ABNORMAL LOW (ref 13.0–17.0)
Immature Granulocytes: 1 %
Lymphocytes Relative: 15 %
Lymphs Abs: 2.6 10*3/uL (ref 0.7–4.0)
MCH: 25.9 pg — ABNORMAL LOW (ref 26.0–34.0)
MCHC: 31.4 g/dL (ref 30.0–36.0)
MCV: 82.4 fL (ref 80.0–100.0)
Monocytes Absolute: 1.8 10*3/uL — ABNORMAL HIGH (ref 0.1–1.0)
Monocytes Relative: 10 %
Neutro Abs: 12.9 10*3/uL — ABNORMAL HIGH (ref 1.7–7.7)
Neutrophils Relative %: 72 %
Platelets: 206 10*3/uL (ref 150–400)
RBC: 3.13 MIL/uL — ABNORMAL LOW (ref 4.22–5.81)
RDW: 18.6 % — ABNORMAL HIGH (ref 11.5–15.5)
WBC: 17.9 10*3/uL — ABNORMAL HIGH (ref 4.0–10.5)
nRBC: 0 % (ref 0.0–0.2)

## 2020-03-17 LAB — PROCALCITONIN: Procalcitonin: 1.88 ng/mL

## 2020-03-17 LAB — GLUCOSE, CAPILLARY
Glucose-Capillary: 142 mg/dL — ABNORMAL HIGH (ref 70–99)
Glucose-Capillary: 157 mg/dL — ABNORMAL HIGH (ref 70–99)
Glucose-Capillary: 154 mg/dL — ABNORMAL HIGH (ref 70–99)
Glucose-Capillary: 143 mg/dL — ABNORMAL HIGH (ref 70–99)
Glucose-Capillary: 147 mg/dL — ABNORMAL HIGH (ref 70–99)

## 2020-03-17 LAB — LACTIC ACID, PLASMA: Lactic Acid, Venous: 1 mmol/L (ref 0.5–1.9)

## 2020-03-17 MED ORDER — LACTATED RINGERS IV SOLN: INTRAVENOUS | Status: DC

## 2020-03-17 MED ORDER — CHLORHEXIDINE GLUCONATE CLOTH 2 % EX PADS
6.0000 | MEDICATED_PAD | Freq: Every day | CUTANEOUS | Status: DC
  Administered 2020-03-19 – 2020-03-21 (×3): 6 via TOPICAL

## 2020-03-17 NOTE — Progress Notes (Addendum)
PROGRESS NOTE  Shane Banks XBJ:[id number] DOB: April 02, 1946 DOA: 02/14/2020 PCP: [name], MD  Brief History   Patient is a 74 year old male with past medical history of chronic hypoxic respiratory failure status post tracheostomy, ESRD on dialysis, atrial fibrillation, nonhemorrhagic stroke who was admitted from [place] for the evaluation of trach site bleeding. Patient had recent history of Covid pneumonia and was on ventilator because he progressed to ARDS. Because of little improvement in the ventilatory and oxygenation, he underwent tracheostomy and was discharged to [facility]. He had recent history of UTI, multidrug-resistant Pseudomonas pneumonia, stenotrophomonas bacteremia. Because of sepsis and shock, he developed renal failure requiring dialysis. On 5/19, he developed hemoptysis and was transferred to [facility] ED for evaluation. PCCM,ID,Nephrology following here. 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 He also underwent tracheostomy tube change by PCCM for persistent leak. Due to lack of insurance, he cannot go back to [employer]. Social worker assisting on placement. Anticipate prolonged hospitalization.  1/8 Admit to [facility] for COVID PNA 2/23 Transfer to [facility] on vent 5/19 Transfer to [facility] for hemoptysis. #6 cuffed trach placed. Back on vent. Bronch performed 5/20 Transitioned to trach collar.  5/22 back on vent full time 5/24 back on antibiotics for fevers 6/1 Initiating PSV weans 6/3 Large cuff leak, trach exchange 6/7 transitioned to [facility] 6/9 remains on TC continuously since 6/7 03/15/2020: Pt returned to ICU for mechanical ventilation and pressors. Pt with sharply decreased saturations while in HD. Patient is requiring pressor support in ED.  Consultants  . Palliative care . Wound care . Interventional Radiology . Infectious Disease . Nephrology . Neurosurgery . ENT . PCCM  Procedures  . Mechanical  Ventilation . Tracheostomy . Trach exchange . Transition to trach collar . Flexible laryngeal endoscopy - no source of bleeding seen. . Bronchoscopy . Tunnelled catheter placement by IR . HD  Antibiotics   Anti-infectives (From admission, onward)   Start     Dose/Rate Route Frequency Ordered Stop   03/07/20 1800  cefTAZidime (FORTAZ) 2 g in sodium chloride 0.9 % 100 mL IVPB     Discontinue     2 g 200 mL/hr over 30 Minutes Intravenous Every T-Th-Sa (1800) 03/06/20 1255     03/07/20 1200  vancomycin (VANCOCIN) IVPB 500 mg/100 ml premix     Discontinue     500 mg 100 mL/hr over 60 Minutes Intravenous Every T-Th-Sa (Hemodialysis) 03/06/20 1255     03/04/20 2000  cefTAZidime (FORTAZ) 2 g in sodium chloride 0.9 % 100 mL IVPB  Status:  Discontinued        2 g 200 mL/hr over 30 Minutes Intravenous Every M-W-F (2000) 03/01/20 1136 03/06/20 1255   03/02/20 1200  vancomycin (VANCOCIN) IVPB 500 mg/100 ml premix        500 mg 100 mL/hr over 60 Minutes Intravenous Every T-Th-Sa (Hemodialysis) 03/01/20 1131 03/02/20 1710   03/01/20 1230  vancomycin (VANCOCIN) IVPB 500 mg/100 ml premix  Status:  Discontinued        500 mg 100 mL/hr over 60 Minutes Intravenous Every M-W-F (Hemodialysis) 03/01/20 1131 03/06/20 1255   02/28/20 1110  vancomycin (VANCOREADY) IVPB 500 mg/100 mL        over 60 Minutes  Continuous PRN 02/28/20 1112 02/28/20 1110   02/28/20 1101  vancomycin (VANCOCIN) 1-5 GM/200ML-% IVPB  Status:  Discontinued       Note to Pharmacy: Lytle Butte   : cabinet override      02/28/20 1101 02/28/20 1327  02/27/20 1800  cefTAZidime (FORTAZ) 1 g in sodium chloride 0.9 % 100 mL IVPB        1 g 200 mL/hr over 30 Minutes Intravenous Every 24 hours 02/27/20 0947 03/03/20 1738   02/27/20 1200  vancomycin (VANCOCIN) IVPB 500 mg/100 ml premix        500 mg 100 mL/hr over 60 Minutes Intravenous Once 02/27/20 0829 02/27/20 1343   02/24/20 1800  vancomycin (VANCOREADY) IVPB 500 mg/100 mL         500 mg 100 mL/hr over 60 Minutes Intravenous  Once 02/24/20 1308 02/24/20 1905   02/22/20 2200  metroNIDAZOLE (FLAGYL) tablet 500 mg     Discontinue     500 mg Per Tube Every 8 hours 02/22/20 0957 04/04/20 2359   02/22/20 1700  cefTRIAXone (ROCEPHIN) 2 g in sodium chloride 0.9 % 100 mL IVPB  Status:  Discontinued        2 g 200 mL/hr over 30 Minutes Intravenous Every 24 hours 02/22/20 0957 02/27/20 0947   02/22/20 1200  vancomycin (VANCOCIN) IVPB 500 mg/100 ml premix  Status:  Discontinued        500 mg 100 mL/hr over 60 Minutes Intravenous Every T-Th-Sa (Hemodialysis) 02/20/20 1253 02/21/20 1040   02/21/20 1431  vancomycin variable dose per unstable renal function (pharmacist dosing)  Status:  Discontinued         Does not apply See admin instructions 02/21/20 1431 03/01/20 1131   02/21/20 1200  vancomycin (VANCOREADY) IVPB 500 mg/100 mL        500 mg 100 mL/hr over 60 Minutes Intravenous  Once 02/21/20 0956 02/21/20 1253   02/21/20 0714  vancomycin (VANCOCIN) 500-5 MG/100ML-% IVPB  Status:  Discontinued       Note to Pharmacy: Ashley Akin   : cabinet override      02/21/20 0714 02/21/20 1346   02/20/20 1400  vancomycin (VANCOREADY) IVPB 1250 mg/250 mL        1,250 mg 166.7 mL/hr over 90 Minutes Intravenous  Once 02/20/20 1253 02/20/20 1538   02/20/20 1200  levofloxacin (LEVAQUIN) IVPB 500 mg  Status:  Discontinued        500 mg 100 mL/hr over 60 Minutes Intravenous Every 48 hours 02/20/20 1020 02/21/20 0943   02/15/20 1545  vancomycin (VANCOCIN) IVPB 1000 mg/200 mL premix  Status:  Discontinued       "Followed by" Linked Group Details   1,000 mg 200 mL/hr over 60 Minutes Intravenous Every 24 hours 02/14/20 1530 02/14/20 1745   02/15/20 0330  ceFEPIme (MAXIPIME) 2 g in sodium chloride 0.9 % 100 mL IVPB  Status:  Discontinued        2 g 200 mL/hr over 30 Minutes Intravenous Every 12 hours 02/14/20 1531 02/14/20 1531   02/14/20 1531  ceFEPIme (MAXIPIME) 2 g in sodium chloride 0.9 %  100 mL IVPB  Status:  Discontinued        2 g 200 mL/hr over 30 Minutes Intravenous Every 12 hours 02/14/20 1531 02/14/20 1745   02/14/20 1530  ceFEPIme (MAXIPIME) 2 g in sodium chloride 0.9 % 100 mL IVPB  Status:  Discontinued        2 g 200 mL/hr over 30 Minutes Intravenous  Once 02/14/20 1520 02/14/20 1531   02/14/20 1530  vancomycin (VANCOREADY) IVPB 1500 mg/300 mL  Status:  Discontinued       "Followed by" Linked Group Details   1,500 mg 150 mL/hr over 120 Minutes Intravenous  Once 02/14/20  1530 02/14/20 1745     .   Subjective  The patient is resting comfortably. Patient is sedated, ventilated. No family at bedside. Nursing confirms no family has visited this patient since last night.  Objective   Vitals:  Vitals:   03/17/20 1600 03/17/20 1700  BP: 109/62 (!) 100/59  Pulse: 87 88  Resp: (!) 28 (!) 34  Temp:    SpO2: 100% 100%   Exam:  Constitutional:  . Patient is sedated, intubated. Neck:  . neck appears normal, no masses, normal ROM, supple . no thyromegaly . Trach collar in place. Respiratory:  . No increased work of breathing. . No wheezes, rales, or rhonchi . No tactile fremitus . Coarse breath sounds bilaterally Cardiovascular:  . Regular rate and rhythm . No murmurs, ectopy, or gallups. . No lateral PMI. No thrills. Abdomen:  . Abdomen is soft, non-tender, non-distended . No hernias, masses, or organomegaly . Normoactive bowel sounds.  Musculoskeletal:  . No cyanosis, clubbing . Mild edema Skin:  . No rashes, lesions, ulcers . palpation of skin: no induration or nodules Neurologic:  . Patient is unable to cooperate with exam. Psychiatric:  Patient is unable to cooperate with exam.  I have personally reviewed the following:   Today's Data  . Vitals, CBC, Glucoses  Micro Data  . Blood cultures x 2 (03/03/2020) No growth  Scheduled Meds: . apixaban  2.5 mg Per Tube BID  . chlorhexidine gluconate (MEDLINE KIT)  15 mL Mouth Rinse BID  .  Chlorhexidine Gluconate Cloth  6 each Topical Q0600  . Chlorhexidine Gluconate Cloth  6 each Topical Q0600  . Chlorhexidine Gluconate Cloth  6 each Topical Q0600  . [START ON 03/18/2020] Chlorhexidine Gluconate Cloth  6 each Topical Q0600  . feeding supplement (PRO-STAT SUGAR FREE 64)  30 mL Per Tube BID  . insulin aspart  0-6 Units Subcutaneous Q4H  . insulin aspart  2 Units Subcutaneous Q4H  . levothyroxine  25 mcg Per Tube Q0600  . mouth rinse  15 mL Mouth Rinse 10 times per day  . metoprolol tartrate  12.5 mg Per Tube BID  . metroNIDAZOLE  500 mg Per Tube Q8H  . nutrition supplement (JUVEN)  1 packet Per Tube BID BM  . pantoprazole sodium  40 mg Per Tube Daily  . vancomycin  500 mg Intravenous Q T,Th,Sa-HD   Continuous Infusions: . sodium chloride Stopped (03/09/20 1302)  . cefTAZidime (FORTAZ)  IV Stopped (03/16/20 2101)  . feeding supplement (NEPRO CARB STEADY) 50 mL/hr at 03/17/20 1600  . 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  Status post trach due to respiratory failure from Covid pneumonia.He ison trach collar.PCCM following. Most recently pneumonia has been multidrug-resistant Pseudomonas for which she was treated with Avycaz and mycin nebulizers at [facility]. Possible ongoing aspiration also. Presented with hemoptysis, aspiration of blood. Patient has cuffed trach placed by ENT.No active bleeding on bronchoscopy. He also underwent tracheostomy tube change by  PCCM  for persistent leak on 03/01/20. Chest x-ray done on 03/03/2020 showed bilateral pleural effusions, stable extensive opacity throughout the left hemithorax. Currently he is on trach collar, requiring 8 L of oxygen per minute. He continues to receive duonebs and CPT. Volume control per HD. The patient was been taken back to HD on the afternoon of 03/15/2020. He was returned to the floor in extremis with saturations in the 70's on NRB. Rapid Response called. Family rescinded DNR and stated that they (Mother, Sister, and brother) that they wished for him to be ventilated. I apprecate Dr. Jorja Loa help.  Sepsis: Hypotension, tachycardia, elevated WBC. Assumed to be due to HCAP with worsening Lt>Rt infiltrates on CXR. Vancomycin and fortaz continued.  CVA: Last night on physical exam the patient appeared to have suffed a catastrophic CVA. Eyes were sharply deviated to the right, and he was unresponsive. Initial CT head was negative for new stroke, but did demonstrate an age indeterminate basal ganglia stroke. MRI brain has been ordered.  Paroxysmal A. YOV:ZCHYIFOYD rate is controlled. Anticoagulation was discontinued due to suspected GI bleed. He is currently on low-dose Eliquis. Added low dose toprol xl 12.74m qd for improved rate control.  Sepsis/bacteremia:Enterococcus and Staphylococcus epidermidis bacteremia HD cath sepsis: TDC removed 5/26. Also has osteomyelitis fifth sacral segment. There is high suspicion for ongoing aspiration. MRI pelvis/sacrum showed osteomyelitis of fifth sacral segment, edema in the muscle around both hips and proximal thighs with possible myositis. ID had recommended treatment for 6 weeks for sacral osteomyelitis and staph epidermidis bacteremia with vancomycin, fortaz and metronidazole. IR consulted for bone biopsy but due to high risk it was not done. He has persistent leukocytosis. Continue to monitor CBC.  Will repeat blood cultrues. Respiratory cultures from  03/16/2020 have had no growth.  ESRD:on HD since mid March 2021. AKI is due to gent toxicity. [facility] S/P new Tunnel catheter placement on  6/2 by IR. HD today again. Now back to MWF.  Chronic normocytic anemia: Secondary to ESRD, critical illness. Has been transfused with PRBCs. Currently hemoglobin stable. Continue to monitor. No HD yesterday due to acute drop in saturations.  Unstageable sacral pressure ulcer/osteomyelitis:Wound care following. Discussion as above.  Cachexia/severe protein malnutrition:On tube feeds. Nutrition following.  Mild elevated LFTs:Continue to monitor.  Severe Protein Calorie Malnutrition: Due to chronic illness. Nutrition consulted.  Pressure Injury 02/14/20 Sacrum Medial Stage 4 - Full thickness tissue loss with exposed bone, tendon or muscle. (Active)  02/14/20 1906  Location: Sacrum  Location Orientation: Medial  Staging: Stage 4 - Full thickness tissue loss with exposed bone, tendon or muscle.  Wound Description (Comments):   Present on Admission: Yes    Pressure Injury 03/03/20 Neck Anterior Stage 3 - Full thickness tissue loss. Subcutaneous fat may be visible but bone, tendon or muscle are NOT exposed. Device related injury under trach tube. (Active)  03/03/20 1805  Location: Neck  Location Orientation: Anterior  Staging: Stage 3 - Full thickness tissue loss. Subcutaneous fat may be visible but bone, tendon or muscle are NOT exposed.  Wound Description (Comments): Device related injury under trach tube.   Present on Admission: No   I have seen and examined this patient myself. I have spent 38 minutes in his evaluation and care.  DVT Prophylaxis: Eliquis  CODE STATUS: Full Code. 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 We have  requested for palliativecare evaluation.plz see note-trying to have discussion with  patient  Per palliative-family  had a meeting and that they wish to continue all aggressive cares,  including vent and dialysis. Per palliative care on 6/14 family is not interested in meeting with  them anymore and have no interest in de-escalating care. Palliative care has signed off.  Family Communication: I spoke with the family last night. They are not present today. Disposition: Patient is from [place]. Anticipate discharge to LTAC/SNF but due to lack of insurance, placement has been difficult. Social working Sports administrator.  Disposition Plan:  Status is: Inpatient Remains inpatient appropriate because:Unsafe d/c plan Dispo: The patient is from:[facility] Anticipated d/c is to: SNF/LTAC Anticipated d/c date is: Unknown Barriers to discharge: funding for LTAC placement/SNf placement  Lerlene Treadwell, DO [facility] Direct contact: see [email or web address]  7PM-7AM contact night coverage as above 03/17/2020, 6:30 PM  LOS: 28 days

## 2020-03-17 NOTE — Progress Notes (Signed)
[facility] Progress Note  Subjective: +750 cc yest, temp to 101 yest , WBC down, low dose levo gtt weaning towards 0.   Vitals:   03/17/20 1525 03/17/20 1547 03/17/20 1600 03/17/20 1700  BP:   109/62 (!) 100/59  Pulse:   87 88  Resp:   (!) 28 (!) 34  Temp:  98 F (36.7 C)    TempSrc:  Oral    SpO2: 100%  100% 100%  Weight:      Height:        Exam:   In ICU on vent  no jvd  Chest coarse BS bilat  Cor reg no RG  Abd soft ntnd no ascites, +llq ostomy, +peg tube   Ext no LE edema   Nonfocal, +gen'd weakness         Summary: came from [facility] to [facility] after COVID hospitalization which included mech ventilation and trach placement. At [facility] had severe infections/ bacteremia/ PNA and developed AKI felt due to IV gent toxicity requiring initiation of HD 12/16/19. Admitted to ICU [facility] for trach bleed on 5/19. Was getting MWF HD at [facility] prior to admit here.   OP HD: started March 2021 at [facility] as above MWF   Getting HD here 3.5- 4h, UF 0.5- 1.5 L, hep 2000 prn   admit 62.8kg > range 56.9- 64.7kg here   CXR 6/18 - IMPRESSION: Stable to slight worsening of left greater than right lung airspace disease.   Assessment/ Plan: 1. 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 Was on the floor for a while, went back to ICU and back on vent this weekend.  Full code now.  Is on 50% FiO2. CXR bilat infiltrates, continues on broad-spec IV abx.  2. Enterococcus/ MRSE bacteremia HD cath sepsis - TDC removed 5/26, per ID getting 6 wks vanc/ fortaz/ flagyl for polymicrobial cath sepsis and sacral osteomyelitis.  3. Sacral osteo/ decub stage IV - as per #2 4. ESRD/ AKI - on HD since mid March 2021, d/t gent toxicity. HD MWF at [facility]. S/P new TDC 6/2 by IR. Next HD Monday.  5. BP/vol - no sig vol excess now by exam, wt's stable around 60kg, unable to UF on HD this past week 6. Trach bleed - resolved 7. Tracheobronchitis - sp  course of doxycycline 8. AMS - variable, seems overall improved 9. Atrial fib - per primary, on eliquis 2.5 BID 10. Anemia ckd - transfuse prn, Hb remains 9- 10 range. Has not required any ESA since admission in early May.  11. Hypercalcemia:  w/ low PTH, prob from immobility, corrCa 11.0 > 10.5>10.3, improved. Cont low 2.0 Ca++ bath , PTH 29. If recurs will consider pamidronate or similar. 12. SP peg/ divert colostomy 13. SP COVID pna - original problem in Feb at OSH 14. DNR/ poor prognosis-  LTACH only option for disposition given needs but apparently not a candidate. Family asked not to be contacted by PCT anymore. PCT have now signed off.  For now cont plan of care.    Rob Ysabel Stankovich 03/17/2020, 5:57 PM   Recent Labs  Lab 03/12/20 0728 03/13/20 0351 03/14/20 0457 03/14/20 0457 03/15/20 0304 03/15/20 0304 03/15/20 2204 03/15/20 2204 03/16/20 0629 03/17/20 0410  K 4.6  --  3.9   < > 3.7  --  3.5  --   --   --   BUN 188*  --  134*  --  57*  --   --   --   --   --  CREATININE 4.44*  --  3.60*  --  2.23*  --   --   --   --   --   CALCIUM 10.6*  --  9.8  --  9.0  --   --   --   --   --   PHOS 4.9*  --   --   --   --   --   --   --   --   --   HGB  --    < > 9.4*   < > 9.8*   < > 10.5*   < > 9.0* 8.1*   < > = values in this interval not displayed.   Inpatient medications: . apixaban  2.5 mg Per Tube BID  . chlorhexidine gluconate (MEDLINE KIT)  15 mL Mouth Rinse BID  . Chlorhexidine Gluconate Cloth  6 each Topical Q0600  . Chlorhexidine Gluconate Cloth  6 each Topical Q0600  . Chlorhexidine Gluconate Cloth  6 each Topical Q0600  . feeding supplement (PRO-STAT SUGAR FREE 64)  30 mL Per Tube BID  . insulin aspart  0-6 Units Subcutaneous Q4H  . insulin aspart  2 Units Subcutaneous Q4H  . levothyroxine  25 mcg Per Tube Q0600  . mouth rinse  15 mL Mouth Rinse 10 times per day  . metoprolol tartrate  12.5 mg Per Tube BID  . metroNIDAZOLE  500 mg Per Tube Q8H  . nutrition  supplement (JUVEN)  1 packet Per Tube BID BM  . pantoprazole sodium  40 mg Per Tube Daily  . vancomycin  500 mg Intravenous Q T,Th,Sa-HD   . sodium chloride Stopped (03/09/20 1302)  . cefTAZidime (FORTAZ)  IV Stopped (03/16/20 2101)  . feeding supplement (NEPRO CARB STEADY) 50 mL/hr at 03/17/20 1600  . lactated ringers 50 mL/hr at 03/17/20 1600   sodium chloride, acetaminophen (TYLENOL) oral liquid 160 mg/5 mL, docusate, fentaNYL (SUBLIMAZE) injection, heparin, ipratropium-albuterol, metoprolol tartrate, polyethylene glycol

## 2020-03-17 NOTE — Progress Notes (Signed)
NAME:  Shane Banks, MRN:  [id number], DOB:  [birth year], LOS: 30 ADMISSION DATE:  02/14/2020, CONSULTATION DATE:  5/19 REFERRING MD:  Dr. Alvino Chapel, CHIEF COMPLAINT:  Hemoptysis   Brief History   74 yo m with PMHx of afib with RVR, BPH, stroke, and COVID ARDS in January requiring trach and LTACH placement at [facility]. Had been tolerating trach collar, but on 5/19 developed hemoptysis and hypoxemic respiratory failure requiring vent. Transferred to [facility] on 5/19.  Past Medical History   has a past medical history of Acute on chronic respiratory failure with hypoxia ([place]), Atrial fibrillation with RVR ([place]), BPH (benign prostatic hyperplasia), COVID-19 virus infection, Pneumonia due to COVID-19 virus, Severe sepsis ([place]), and Stroke ([place]).  Significant Hospital Events   1/8  Admit to [facility] for COVID PNA 2/23 Transfer to [facility] on vent 5/19 Transfer to [facility] for hemoptysis. #6 cuffed trach placed. Back on vent. Bronch performed 5/20 Transitioned to trach collar.  5/22 back on vent full time 5/24 back on antibiotics for fevers 6/1 Initiating PSV weans 6/3 Large cuff leak, trach exchange 6/7 transitioned to [facility] 6/9 remains on TC continuously since 6/7  Consults:  ENT Nephrology ID PCCM  Procedures:  ENT flex scope 5/19 > no bleeding source identified FOB 5/19 > no pulmonary bleeding source identified.   Significant Diagnostic Tests:  CXR 5/28 >> R CVC placement confirmed Slightly improved aeration at the right base. 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 Edema in the muscles around the hips and in the posterior paraspinal musculature of the lower lumbar spine and in the buttocks which could represent myositis. Fluid-fluid level in the bladder may represent protein or debris in  the bladder. The possibility of urinary tract infection should be considered.  CXR 6/3> improved R lung aeration, worsening L sided opacity. Tracheostomy tube, HD catheter.   CXR 6/4>wosened R lung infiltrates and stable L sided infiltrate. Possible bilateral pleural effusions   Micro Data:    tracheal aspirate 5/7> for Pseudomonas A  BAL 5/19 > no growth 5/19 blood > NG 5/22 trach aspirate>> candida paras[place] 5/22 blood>> 1/4 GPC> staph epi 5/25 blood>> GPC (enterococcus on biofire) 2/4 bottles>> E. Faecalis, staph epi. 5/25 trach aspirate>>many PMN, few GPR, rare GPC> diphtheroids 5/25 blood cx>> + for Enterococcus Faecalis (gent resistant)/ Staphylococcus Epidermidis (tetracycline, vanc, rifampin sensitive) 5/27 BCx> ngtd    Antimicrobials:  Levofloxacin 5/24>5/26 vanc 5/25> Ceftriaxone  5/27>6/1 Metronidazole 5/27>  [name] 6/1>  Interim history/subjective:  Returned to ICU yesterday for increased secretions requiring mechanical ventilation  Objective   Blood pressure 110/63, pulse 88, temperature 99.9 F (37.7 C), temperature source Oral, resp. rate (!) 34, height _0  (1.753 m), weight 61 kg, SpO2 100 %.    Vent Mode: PSV;CPAP FiO2 (%):  [40 %] 40 % Set Rate:  [15 bmp] 15 bmp Vt Set:  [480 mL] 480 mL PEEP:  [5 cmH20] 5 cmH20 Pressure Support:  [15 cmH20] 15 cmH20 Plateau Pressure:  [24 cmH20-28 cmH20] 24 cmH20   Intake/Output Summary (Last 24 hours) at 03/17/2020 1346 Last data filed at 03/17/2020 1200 Gross per 24 hour  Intake 1143.13 ml  Output 150 ml  Net 993.13 ml   Filed Weights   03/15/20 2145 03/16/20 0203 03/17/20 0401  Weight: 59.5 kg 59.4 kg 61 kg    Examination:  Chronically ill, trach collar in place Trach unremarkable, few tan secretions Coarse bilateral breath sounds, no wheezing-did not tolerate SBT Heart distant without a murmur Nondistended, PEG in place ostomy in place positive bowel sounds 1+ lower extremity edema Eyes open not  following command spontaneous head movement.  No limb movements.  Resolved Hospital Problem list     Assessment & Plan:   Acute on chronic hypoxemic respiratory failure requiring tracheostomy, prolonged MV COVID fibrosis,  Deconditioning multiple HCAPs.  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 End-stage renal disease on hemodialysis.  Plan:  Continue chest physiotherapy and current antibiotics Wean to trach collar as tolerated. Continue current feeding plan. Limited options for further intervention.  Family has been resistant to placing any limitations in care.   Daily Goals Checklist  Pain/Anxiety/Delirium protocol (if indicated): As needed only VAP protocol (if indicated): Bundle in place Respiratory support goals: Daily SBT/trach collar trial Blood pressure target: Keep MAP greater than 65 titrate low-dose norepinephrine DVT prophylaxis: Apixaban Nutrition Status: High nutritional risk.  On tube feeds. GI prophylaxis: Pantoprazole Fluid status goals: Likely volume contracted.  Gentle hydration. Urinary catheter: Condom catheter Central lines: Chronic HD catheter. Glucose control: Currently euvolemic Mobility/therapy needs: Bedbound at baseline Antibiotic de-escalation: On prolonged course of ceftazidime metronidazole and vancomycin for chronic osteomyelitis. Home medication reconciliation: None Daily labs: CBC BMP Code Status: Full code Family Communication: Family updated by hospitalist yesterday Disposition: ICU.

## 2020-03-17 NOTE — Progress Notes (Signed)
Pharmacy Antibiotic Note  Shane Banks is a 74 y.o. male admitted on 02/14/2020 with CoNS/E.faecalis bacteremia/sacral osteomyelitis/pneumonia. 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 Line pulled 5/26, line holiday and had it replaced 5/28. TTE negative. Repeat BCx are negative. ID planning 6 weeks of therapy.  Vanc random 6/17 pre dialysis= 20 (goal 15-25 mcg/ml preHD). Would get another level next week. TA showing moderate pseudomonas, already on appropriate therapy with [place]. Will continue and follow for sensitivities to confirm.  Height: 5\' 9"  (175.3 cm) Weight: 61 kg (134 lb 7.7 oz) IBW/kg (Calculated) : 70.7  Temp (24hrs), Avg:99.8 F (37.7 C), Min:98.7 F (37.1 C), Max:101 F (38.3 C)  Recent Labs  Lab 03/11/20 0236 03/12/20 0427 03/12/20 0728 03/13/20 0351 03/14/20 0457 03/15/20 0304 03/16/20 0629 03/17/20 0410  WBC 21.1*   < >  --  18.2* 22.1* 21.4* 21.7* 17.9*  CREATININE 3.28*  --  4.44*  --  3.60* 2.23*  --   --   VANCORANDOM  --   --   --   --  20  --   --   --    < > = values in this interval not displayed.    Estimated Creatinine Clearance: 25.8 mL/min (A) (by C-G formula based on SCr of 2.23 mg/dL (H)).    Allergies  Allergen Reactions  . Aspirin Rash  . Cephalexin Other (See Comments)    dizziness  . Penicillins     Itching,swelling    Plan: Continue Vanc 500mg  IV qHD  Continue Fortaz  2gm IV qHD Continue Flagyl 500mg  PT Q8H per MD Monitor HD schedule/tolerance, weekly vanc trough Abx end date - 04/04/20 per ID   Nicoletta Dress, PharmD PGY2 Infectious Disease Pharmacy Resident    03/17/2020 10:50 AM

## 2020-03-18 LAB — GLUCOSE, CAPILLARY
Glucose-Capillary: 123 mg/dL — ABNORMAL HIGH (ref 70–99)
Glucose-Capillary: 126 mg/dL — ABNORMAL HIGH (ref 70–99)
Glucose-Capillary: 144 mg/dL — ABNORMAL HIGH (ref 70–99)
Glucose-Capillary: 162 mg/dL — ABNORMAL HIGH (ref 70–99)
Glucose-Capillary: 177 mg/dL — ABNORMAL HIGH (ref 70–99)
Glucose-Capillary: 185 mg/dL — ABNORMAL HIGH (ref 70–99)

## 2020-03-18 LAB — RENAL FUNCTION PANEL
Albumin: 1.7 g/dL — ABNORMAL LOW (ref 3.5–5.0)
Anion gap: 12 (ref 5–15)
BUN: 112 mg/dL — ABNORMAL HIGH (ref 8–23)
CO2: 24 mmol/L (ref 22–32)
Calcium: 9.1 mg/dL (ref 8.9–10.3)
Chloride: 98 mmol/L (ref 98–111)
Creatinine, Ser: 3.78 mg/dL — ABNORMAL HIGH (ref 0.61–1.24)
GFR calc Af Amer: 17 mL/min — ABNORMAL LOW (ref >60)
GFR calc non Af Amer: 15 mL/min — ABNORMAL LOW (ref >60)
Glucose, Bld: 193 mg/dL — ABNORMAL HIGH (ref 70–99)
Phosphorus: 3.9 mg/dL (ref 2.5–4.6)
Potassium: 3.1 mmol/L — ABNORMAL LOW (ref 3.5–5.1)
Sodium: 134 mmol/L — ABNORMAL LOW (ref 135–145)

## 2020-03-18 LAB — CBC WITH DIFFERENTIAL/PLATELET
Abs Immature Granulocytes: 0.1 10*3/uL — ABNORMAL HIGH (ref 0.00–0.07)
Basophils Absolute: 0.1 10*3/uL (ref 0.0–0.1)
Basophils Relative: 0 %
Eosinophils Absolute: 0.5 10*3/uL (ref 0.0–0.5)
Eosinophils Relative: 4 %
HCT: 25.7 % — ABNORMAL LOW (ref 39.0–52.0)
Hemoglobin: 8.1 g/dL — ABNORMAL LOW (ref 13.0–17.0)
Immature Granulocytes: 1 %
Lymphocytes Relative: 13 %
Lymphs Abs: 1.8 10*3/uL (ref 0.7–4.0)
MCH: 25.9 pg — ABNORMAL LOW (ref 26.0–34.0)
MCHC: 31.5 g/dL (ref 30.0–36.0)
MCV: 82.1 fL (ref 80.0–100.0)
Monocytes Absolute: 1.3 10*3/uL — ABNORMAL HIGH (ref 0.1–1.0)
Monocytes Relative: 9 %
Neutro Abs: 10.9 10*3/uL — ABNORMAL HIGH (ref 1.7–7.7)
Neutrophils Relative %: 73 %
Platelets: 191 10*3/uL (ref 150–400)
RBC: 3.13 MIL/uL — ABNORMAL LOW (ref 4.22–5.81)
RDW: 18.4 % — ABNORMAL HIGH (ref 11.5–15.5)
WBC: 14.7 10*3/uL — ABNORMAL HIGH (ref 4.0–10.5)
nRBC: 0 % (ref 0.0–0.2)

## 2020-03-18 LAB — CULTURE, RESPIRATORY W GRAM STAIN

## 2020-03-18 LAB — LACTIC ACID, PLASMA: Lactic Acid, Venous: 1.3 mmol/L (ref 0.5–1.9)

## 2020-03-18 LAB — PROCALCITONIN: Procalcitonin: 1.66 ng/mL

## 2020-03-18 LAB — MAGNESIUM: Magnesium: 1.7 mg/dL (ref 1.7–2.4)

## 2020-03-18 MED ORDER — METOPROLOL TARTRATE 25 MG PO TABS
25.0000 mg | ORAL_TABLET | Freq: Two times a day (BID) | ORAL | Status: DC
  Administered 2020-03-18 – 2020-04-24 (×68): 25 mg
  Filled 2020-03-18 (×70): qty 1

## 2020-03-18 MED ORDER — SODIUM CHLORIDE 0.9 % IV SOLN
2.0000 g | INTRAVENOUS | Status: DC
  Filled 2020-03-18: qty 2

## 2020-03-18 MED ORDER — VANCOMYCIN HCL IN DEXTROSE 500-5 MG/100ML-% IV SOLN
500.0000 mg | INTRAVENOUS | Status: AC
  Administered 2020-03-18: 500 mg via INTRAVENOUS
  Filled 2020-03-18: qty 100

## 2020-03-18 MED ORDER — HEPARIN SODIUM (PORCINE) 1000 UNIT/ML IJ SOLN
INTRAMUSCULAR | Status: AC
  Administered 2020-03-18: 1000 [IU]
  Filled 2020-03-18: qty 6

## 2020-03-18 MED ORDER — SODIUM CHLORIDE 0.9 % IV SOLN
1.0000 g | INTRAVENOUS | Status: DC
  Administered 2020-03-25 – 2020-03-27 (×3): 1 g via INTRAVENOUS
  Filled 2020-03-18 (×4): qty 1

## 2020-03-18 MED ORDER — DARBEPOETIN ALFA 100 MCG/0.5ML IJ SOSY
100.0000 ug | PREFILLED_SYRINGE | INTRAMUSCULAR | Status: DC
  Administered 2020-03-18 – 2020-04-15 (×4): 100 ug via INTRAVENOUS
  Filled 2020-03-18 (×5): qty 0.5

## 2020-03-18 MED ORDER — HEPARIN SODIUM (PORCINE) 1000 UNIT/ML DIALYSIS
2000.0000 [IU] | INTRAMUSCULAR | Status: AC | PRN
  Administered 2020-03-18: 2000 [IU] via INTRAVENOUS_CENTRAL

## 2020-03-18 MED ORDER — SODIUM CHLORIDE 0.9 % IV SOLN
500.0000 mg | INTRAVENOUS | Status: AC
  Administered 2020-03-18 – 2020-03-23 (×6): 500 mg via INTRAVENOUS
  Filled 2020-03-18 (×8): qty 0.5

## 2020-03-18 NOTE — Progress Notes (Signed)
NAME:  Shane Banks, MRN:  [id number], DOB:  [birth year], LOS: 7 ADMISSION DATE:  02/14/2020, CONSULTATION DATE:  5/19 REFERRING MD:  Dr. Alvino Chapel, CHIEF COMPLAINT:  Hemoptysis   Brief History   74 yo m with PMHx of afib with RVR, BPH, stroke, and COVID ARDS in January requiring trach and LTACH placement at [facility]. Had been tolerating trach collar, but on 5/19 developed hemoptysis and hypoxemic respiratory failure requiring vent. Transferred to [facility] on 5/19.  Past Medical History   has a past medical history of Acute on chronic respiratory failure with hypoxia ([place]), Atrial fibrillation with RVR ([place]), BPH (benign prostatic hyperplasia), COVID-19 virus infection, Pneumonia due to COVID-19 virus, Severe sepsis ([place]), and Stroke ([place]).  Significant Hospital Events   1/8  Admit to [facility] for COVID PNA 2/23 Transfer to [facility] on vent 5/19 Transfer to [facility] for hemoptysis. #6 cuffed trach placed. Back on vent. Bronch performed 5/20 Transitioned to trach collar.  5/22 back on vent full time 5/24 back on antibiotics for fevers 6/1 Initiating PSV weans 6/3 Large cuff leak, trach exchange 6/7 transitioned to [facility] 6/9 remains on TC continuously since 6/7  Consults:  ENT Nephrology ID PCCM  Procedures:  ENT flex scope 5/19 > no bleeding source identified FOB 5/19 > no pulmonary bleeding source identified.   Significant Diagnostic Tests:  CXR 5/28 >> R CVC placement confirmed Slightly improved aeration at the right base. 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 Edema in the muscles around the hips and in the posterior paraspinal musculature of the lower lumbar spine and in the buttocks which could represent myositis. Fluid-fluid level in the bladder may represent protein or debris in  the bladder. The possibility of urinary tract infection should be considered.  CXR 6/3> improved R lung aeration, worsening L sided opacity. Tracheostomy tube, HD catheter.   CXR 6/4>wosened R lung infiltrates and stable L sided infiltrate. Possible bilateral pleural effusions   Micro Data:    tracheal aspirate 5/7> for Pseudomonas A  BAL 5/19 > no growth 5/19 blood > NG 5/22 trach aspirate>> candida [place] 5/22 blood>> 1/4 GPC> staph epi 5/25 blood>> GPC (enterococcus on biofire) 2/4 bottles>> E. Faecalis, staph epi. 5/25 trach aspirate>>many PMN, few GPR, rare GPC> diphtheroids 5/25 blood cx>> + for Enterococcus Faecalis (gent resistant)/ Staphylococcus Epidermidis (tetracycline, vanc, rifampin sensitive) 5/27 BCx> ngtd    Antimicrobials:  Levofloxacin 5/24>5/26 vanc 5/25> Ceftriaxone  5/27>6/1 Metronidazole 5/27>  [name] 6/1> Meropenem 6/21  To continue antibiotics up until 04/04/2020  Interim history/subjective:  Returned to ICU 6/19 for increased secretions requiring mechanical ventilation No overnight events, currently on vent  Objective   Blood pressure 104/61, pulse 89, temperature 98.4 F (36.9 C), temperature source Oral, resp. rate (!) 28, height _0  (1.753 m), weight 61.7 kg, SpO2 100 %.    Vent Mode: PRVC FiO2 (%):  [40 %] 40 % Set Rate:  [15 bmp] 15 bmp Vt Set:  [480 mL] 480 mL PEEP:  [5 cmH20] 5 cmH20 Pressure Support:  [15 cmH20] 15 cmH20 Plateau Pressure:  [26 cmH20-28 cmH20] 27 cmH20   Intake/Output Summary (Last 24 hours) at 03/18/2020 1057 Last data filed at 03/18/2020 0800 Gross per 24 hour  Intake 1654.26 ml  Output 75 ml  Net 1579.26 ml   Filed Weights   03/16/20 0203 03/17/20 0401  03/18/20 0423  Weight: 59.4 kg 61 kg 61.7 kg    Examination:   Chronically ill, trach collar in place Trach-tan secretions Chest: Coarse breath sounds bilaterally CVS: Distant heart sounds Nondistended, PEG in place ostomy in place positive bowel  sounds 1+ lower extremity edema Not following commands  Chest x-ray 6/18 reviewed-slight worsening of basilar infiltrates Resolved Hospital Problem list     Assessment & Plan:   Acute on chronic hypoxemic respiratory failure requiring tracheostomy, prolonged MV COVID fibrosis,  Deconditioning multiple HCAPs. 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 -On antibiotics -Appreciate infectious disease and pharmacy input  End-stage renal disease on hemodialysis. -Continue hemodialysis  Limited options for further intervention.  Family has been resistant to placing any limitations in care.   Daily Goals Checklist  Pain/Anxiety/Delirium protocol (if indicated): As needed only VAP protocol (if indicated): Bundle in place Respiratory support goals: Daily SBT/trach collar trial Blood pressure target: Keep MAP greater than 65 titrate low-dose norepinephrine DVT prophylaxis: Apixaban Nutrition Status: High nutritional risk.  On tube feeds. GI prophylaxis: Pantoprazole Fluid status goals: Likely volume contracted.  Gentle hydration. Urinary catheter: Condom catheter Central lines: Chronic HD catheter. Glucose control: Currently euvolemic Mobility/therapy needs: Bedbound at baseline Antibiotic de-escalation: On prolonged course of ceftazidime metronidazole and vancomycin for chronic osteomyelitis. Home medication reconciliation: None Daily labs: CBC BMP Code Status: Full code Family Communication: Family updated by hospitalist 6/19 Disposition: ICU.  The patient is critically ill with multiple organ systems failure and requires high complexity decision making for assessment and support, frequent evaluation and titration of therapies, application of advanced monitoring  technologies and extensive interpretation of multiple databases. Critical Care Time devoted to patient care services described in this note independent of APP/resident time (if applicable)  is 30 minutes.   Sherrilyn Rist MD [place] [facility] Personal pager: [phone] If unanswered, please page CCM On-call: [phone]

## 2020-03-18 NOTE — Progress Notes (Signed)
Pharmacy Antibiotic Note  Shane Banks is a 74 y.o. male admitted on 02/14/2020 with CoNS/E.faecalis bacteremia/sacral osteomyelitis/pneumonia. 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 Line pulled 5/26, line holiday and had it replaced 5/28. TTE negative. Repeat BCx are negative. ID planning 6 weeks of therapy.  Pseudomonas has grown in respiratory cx resistant to ceftaz  Height: 5\' 9"  (175.3 cm) Weight: 61.7 kg (136 lb 0.4 oz) IBW/kg (Calculated) : 70.7  Temp (24hrs), Avg:98.8 F (37.1 C), Min:98 F (36.7 C), Max:99.9 F (37.7 C)  Recent Labs  Lab 03/12/20 0728 03/13/20 0351 03/14/20 0457 03/15/20 0304 03/16/20 0629 03/17/20 0410 03/17/20 2032 03/18/20 0428  WBC  --    < > 22.1* 21.4* 21.7* 17.9*  --  14.7*  CREATININE 4.44*  --  3.60* 2.23*  --   --   --  3.78*  LATICACIDVEN  --   --   --   --   --   --  1.0 1.3  VANCORANDOM  --   --  20  --   --   --   --   --    < > = values in this interval not displayed.    Estimated Creatinine Clearance: 15.4 mL/min (A) (by C-G formula based on SCr of 3.78 mg/dL (H)).    Allergies  Allergen Reactions  . Aspirin Rash  . Cephalexin Other (See Comments)    dizziness  . Penicillins     Itching,swelling    Plan: Continue Vanc 500mg  IV qHD - dose added for HD today DC fortaz Meropenem 500 mg q24h x 1 week Then return to fortaz 1 g q24h to complete course through 7/8 vanc level mid-week  [name], PharmD, BCPS, BCCCP Clinical Pharmacist [phone]  Please check AMION for all [place] numbers  03/18/2020 10:18 AM

## 2020-03-18 NOTE — Progress Notes (Signed)
PROGRESS NOTE  Shane Banks TDH:[id number] DOB: [birth year] DOA: 02/14/2020 PCP: [name], MD  Brief History   Patient is a 74 year old male with past medical history of chronic hypoxic respiratory failure status post tracheostomy, ESRD on dialysis, atrial fibrillation, nonhemorrhagic stroke who was admitted from [facility] for the evaluation of trach site bleeding. Patient had recent history of Covid pneumonia and was on ventilator because he progressed to ARDS. Because of little improvement in the ventilatory and oxygenation, he underwent tracheostomy and was discharged to [facility]. He had recent history of UTI, multidrug-resistant Pseudomonas pneumonia, stenotrophomonas bacteremia. Because of sepsis and shock, he developed renal failure requiring dialysis. On 5/19, he developed hemoptysis and was transferred to [facility] ED for evaluation. PCCM,ID,Nephrology following here. 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 He also underwent tracheostomy tube change by PCCM for persistent leak. Due to lack of insurance, he cannot go back to [facility]. Social worker assisting on placement. Anticipate prolonged hospitalization.  1/8 Admit to [facility] for COVID PNA 2/23 Transfer to [facility] on vent 5/19 Transfer to [facility] for hemoptysis. #6 cuffed trach placed. Back on vent. Bronch performed 5/20 Transitioned to trach collar.  5/22 back on vent full time 5/24 back on antibiotics for fevers 6/1 Initiating PSV weans 6/3 Large cuff leak, trach exchange 6/7 transitioned to [place] 6/9 remains on TC continuously since 6/7 03/15/2020: Pt returned to ICU for mechanical ventilation and pressors. Pt with sharply decreased saturations while in HD. Patient is requiring pressor support in ED.  Consultants  . Palliative care . Wound care . Interventional Radiology . Infectious Disease . Nephrology . Neurosurgery . ENT . PCCM  Procedures  . Mechanical  Ventilation . Tracheostomy . Trach exchange . Transition to trach collar . Flexible laryngeal endoscopy - no source of bleeding seen. . Bronchoscopy . Tunnelled catheter placement by IR . HD  Antibiotics   Anti-infectives (From admission, onward)   Start     Dose/Rate Route Frequency Ordered Stop   03/25/20 1800  cefTAZidime (FORTAZ) 1 g in sodium chloride 0.9 % 100 mL IVPB     Discontinue    "Followed by" Linked Group Details   1 g 200 mL/hr over 30 Minutes Intravenous Every 24 hours 03/18/20 1015 04/05/20 1759   03/18/20 1800  meropenem (MERREM) 500 mg in sodium chloride 0.9 % 100 mL IVPB     Discontinue    "Followed by" Linked Group Details   500 mg 200 mL/hr over 30 Minutes Intravenous Every 24 hours 03/18/20 1015 03/25/20 1759   03/18/20 1200  vancomycin (VANCOCIN) IVPB 500 mg/100 ml premix        500 mg 100 mL/hr over 60 Minutes Intravenous Every M-W-F (Hemodialysis) 03/18/20 0938 03/18/20 1700   03/18/20 1200  cefTAZidime (FORTAZ) 2 g in sodium chloride 0.9 % 100 mL IVPB  Status:  Discontinued        2 g 200 mL/hr over 30 Minutes Intravenous Every Mon (Hemodialysis) 03/18/20 0939 03/18/20 1015   03/07/20 1800  cefTAZidime (FORTAZ) 2 g in sodium chloride 0.9 % 100 mL IVPB  Status:  Discontinued        2 g 200 mL/hr over 30 Minutes Intravenous Every T-Th-Sa (1800) 03/06/20 1255 03/18/20 1015   03/07/20 1200  vancomycin (VANCOCIN) IVPB 500 mg/100 ml premix     Discontinue     500 mg 100 mL/hr over 60 Minutes Intravenous Every T-Th-Sa (Hemodialysis) 03/06/20 1255     03/04/20 2000  cefTAZidime (FORTAZ) 2 g  in sodium chloride 0.9 % 100 mL IVPB  Status:  Discontinued        2 g 200 mL/hr over 30 Minutes Intravenous Every M-W-F (2000) 03/01/20 1136 03/06/20 1255   03/02/20 1200  vancomycin (VANCOCIN) IVPB 500 mg/100 ml premix        500 mg 100 mL/hr over 60 Minutes Intravenous Every T-Th-Sa (Hemodialysis) 03/01/20 1131 03/02/20 1710   03/01/20 1230  vancomycin (VANCOCIN) IVPB  500 mg/100 ml premix  Status:  Discontinued        500 mg 100 mL/hr over 60 Minutes Intravenous Every M-W-F (Hemodialysis) 03/01/20 1131 03/06/20 1255   02/28/20 1110  vancomycin (VANCOREADY) IVPB 500 mg/100 mL        over 60 Minutes  Continuous PRN 02/28/20 1112 02/28/20 1110   02/28/20 1101  vancomycin (VANCOCIN) 1-5 GM/200ML-% IVPB  Status:  Discontinued       Note to Pharmacy: Lytle Butte   : cabinet override      02/28/20 1101 02/28/20 1327   02/27/20 1800  cefTAZidime (FORTAZ) 1 g in sodium chloride 0.9 % 100 mL IVPB        1 g 200 mL/hr over 30 Minutes Intravenous Every 24 hours 02/27/20 0947 03/03/20 1738   02/27/20 1200  vancomycin (VANCOCIN) IVPB 500 mg/100 ml premix        500 mg 100 mL/hr over 60 Minutes Intravenous Once 02/27/20 0829 02/27/20 1343   02/24/20 1800  vancomycin (VANCOREADY) IVPB 500 mg/100 mL        500 mg 100 mL/hr over 60 Minutes Intravenous  Once 02/24/20 1308 02/24/20 1905   02/22/20 2200  metroNIDAZOLE (FLAGYL) tablet 500 mg     Discontinue     500 mg Per Tube Every 8 hours 02/22/20 0957 04/04/20 2359   02/22/20 1700  cefTRIAXone (ROCEPHIN) 2 g in sodium chloride 0.9 % 100 mL IVPB  Status:  Discontinued        2 g 200 mL/hr over 30 Minutes Intravenous Every 24 hours 02/22/20 0957 02/27/20 0947   02/22/20 1200  vancomycin (VANCOCIN) IVPB 500 mg/100 ml premix  Status:  Discontinued        500 mg 100 mL/hr over 60 Minutes Intravenous Every T-Th-Sa (Hemodialysis) 02/20/20 1253 02/21/20 1040   02/21/20 1431  vancomycin variable dose per unstable renal function (pharmacist dosing)  Status:  Discontinued         Does not apply See admin instructions 02/21/20 1431 03/01/20 1131   02/21/20 1200  vancomycin (VANCOREADY) IVPB 500 mg/100 mL        500 mg 100 mL/hr over 60 Minutes Intravenous  Once 02/21/20 0956 02/21/20 1253   02/21/20 0714  vancomycin (VANCOCIN) 500-5 MG/100ML-% IVPB  Status:  Discontinued       Note to Pharmacy: Ashley Akin   : cabinet  override      02/21/20 0714 02/21/20 1346   02/20/20 1400  vancomycin (VANCOREADY) IVPB 1250 mg/250 mL        1,250 mg 166.7 mL/hr over 90 Minutes Intravenous  Once 02/20/20 1253 02/20/20 1538   02/20/20 1200  levofloxacin (LEVAQUIN) IVPB 500 mg  Status:  Discontinued        500 mg 100 mL/hr over 60 Minutes Intravenous Every 48 hours 02/20/20 1020 02/21/20 0943   02/15/20 1545  vancomycin (VANCOCIN) IVPB 1000 mg/200 mL premix  Status:  Discontinued       "Followed by" Linked Group Details   1,000 mg 200 mL/hr over 60 Minutes  Intravenous Every 24 hours 02/14/20 1530 02/14/20 1745   02/15/20 0330  ceFEPIme (MAXIPIME) 2 g in sodium chloride 0.9 % 100 mL IVPB  Status:  Discontinued        2 g 200 mL/hr over 30 Minutes Intravenous Every 12 hours 02/14/20 1531 02/14/20 1531   02/14/20 1531  ceFEPIme (MAXIPIME) 2 g in sodium chloride 0.9 % 100 mL IVPB  Status:  Discontinued        2 g 200 mL/hr over 30 Minutes Intravenous Every 12 hours 02/14/20 1531 02/14/20 1745   02/14/20 1530  ceFEPIme (MAXIPIME) 2 g in sodium chloride 0.9 % 100 mL IVPB  Status:  Discontinued        2 g 200 mL/hr over 30 Minutes Intravenous  Once 02/14/20 1520 02/14/20 1531   02/14/20 1530  vancomycin (VANCOREADY) IVPB 1500 mg/300 mL  Status:  Discontinued       "Followed by" Linked Group Details   1,500 mg 150 mL/hr over 120 Minutes Intravenous  Once 02/14/20 1530 02/14/20 1745     .   Subjective  The patient is resting comfortably. Patient is awake and alert today. No family at bedside.   Objective   Vitals:  Vitals:   03/18/20 1700 03/18/20 1710  BP: 120/71 130/67  Pulse: 92 90  Resp: (!) 28 20  Temp:  98.1 F (36.7 C)  SpO2: 100% 100%   Exam:  Constitutional:  . Patient is awake, nonverbal, intubated. Neck:  . neck appears normal, no masses, normal ROM, supple . no thyromegaly . Trach collar in place. Respiratory:  . No increased work of breathing. . No wheezes, rales, or rhonchi . No tactile  fremitus . Coarse breath sounds bilaterally Cardiovascular:  . Regular rate and rhythm . No murmurs, ectopy, or gallups. . No lateral PMI. No thrills. Abdomen:  . Abdomen is soft, non-tender, non-distended . No hernias, masses, or organomegaly . Normoactive bowel sounds.  Musculoskeletal:  . No cyanosis, clubbing . Mild edema Skin:  . No rashes, lesions, ulcers . palpation of skin: no induration or nodules Neurologic:  . Patient is unable to cooperate with exam. Psychiatric:  Patient is unable to cooperate with exam.  I have personally reviewed the following:   Today's Data  . Vitals, CBC, BMP, Glucoses, Procalcitonin, Lactic Acid  Micro Data  . Blood cultures x 2 (03/03/2020) No growth  Scheduled Meds: . apixaban  2.5 mg Per Tube BID  . chlorhexidine gluconate (MEDLINE KIT)  15 mL Mouth Rinse BID  . Chlorhexidine Gluconate Cloth  6 each Topical Q0600  . Chlorhexidine Gluconate Cloth  6 each Topical Q0600  . Chlorhexidine Gluconate Cloth  6 each Topical Q0600  . Chlorhexidine Gluconate Cloth  6 each Topical Q0600  . darbepoetin (ARANESP) injection - DIALYSIS  100 mcg Intravenous Q Mon-HD  . feeding supplement (PRO-STAT SUGAR FREE 64)  30 mL Per Tube BID  . insulin aspart  0-6 Units Subcutaneous Q4H  . insulin aspart  2 Units Subcutaneous Q4H  . levothyroxine  25 mcg Per Tube Q0600  . mouth rinse  15 mL Mouth Rinse 10 times per day  . metoprolol tartrate  12.5 mg Per Tube BID  . metroNIDAZOLE  500 mg Per Tube Q8H  . nutrition supplement (JUVEN)  1 packet Per Tube BID BM  . pantoprazole sodium  40 mg Per Tube Daily  . vancomycin  500 mg Intravenous Q T,Th,Sa-HD   Continuous Infusions: . sodium chloride Stopped (03/09/20 1302)  .  meropenem (MERREM) IV     Followed by  . [START ON 03/25/2020] cefTAZidime (FORTAZ)  IV    . feeding supplement (NEPRO CARB STEADY) 50 mL/hr at 03/18/20 1100  . 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  Status post trach due to respiratory failure from Covid pneumonia.He ison trach collar.PCCM following. Most recently pneumonia has been multidrug-resistant Pseudomonas for which she was treated with Avycaz and mycin nebulizers at [facility]. Possible ongoing aspiration also. Presented with hemoptysis, aspiration of blood. Patient has cuffed trach placed by ENT.No active bleeding on bronchoscopy. He also underwent tracheostomy tube change by PCCM for persistent leak on 03/01/20. Chest x-ray done on 03/03/2020 showed bilateral pleural effusions, stable extensive opacity throughout the left hemithorax. Currently he is on trach collar, requiring 8 L of oxygen per minute. He continues to receive duonebs and CPT. Volume control per HD. The patient was been taken back to HD on the afternoon of 03/15/2020. He was returned to the floor in extremis with saturations in the 70's on NRB. Rapid Response called. Family rescinded DNR and stated that they (Mother, Sister, and brother) that they wished for him to be ventilated. I apprecate Dr. Jorja Loa help.  HCAP: Pt with purulent secretions from trach. WBC is elevated as is procalcitonin. Lactic acid is negative. The patient is receiving Vanc, Flagyl, and Merrem.  Sepsis: Hypotension, tachycardia, elevated WBC. Assumed to be due to HCAP with worsening Lt>Rt infiltrates on CXR. The patient is receiving Vanc, Flagyl, and Merrem.  CVA: Last night on physical exam the patient appeared to have suffed a  catastrophic CVA. Eyes were sharply deviated to the right, and he was unresponsive. Initial CT head was negative for new stroke, but did demonstrate an age indeterminate basal ganglia stroke. MRI brain has been ordered.  Paroxysmal A. GUY:[id number] rate is controlled. Anticoagulation was discontinued due to suspected GI bleed. He is currently on low-dose Eliquis. Added low dose toprol xl 12.5 mg qd for improved rate control.  Sepsis/bacteremia:Enterococcus and Staphylococcus epidermidis bacteremia HD cath sepsis: TDC removed 5/26. Also has osteomyelitis fifth sacral segment. There is high suspicion for ongoing aspiration. MRI pelvis/sacrum showed osteomyelitis of fifth sacral segment, edema in the muscle around both hips and proximal thighs with possible myositis. ID had recommended treatment for 6 weeks for sacral osteomyelitis and staph epidermidis bacteremia with vancomycin, fortaz and metronidazole. IR consulted for bone biopsy but due to high risk it was not done. He has persistent leukocytosis. Continue to monitor CBC.  Will repeat blood cultrues. Respiratory cultures from 03/16/2020 have had no growth. Blood cultures x 2 have been repeated.   ESRD:on HD since mid March 2021. AKI is due to gent toxicity. HD MWF at [facility]. S/P new Tunnel catheter placement on  6/2 by IR. HD today again. Now back to MWF.  Chronic normocytic anemia: Secondary to ESRD, critical illness. Has been transfused with PRBCs. Currently hemoglobin stable. Continue to monitor. No HD yesterday due to acute drop in saturations.  Unstageable sacral pressure ulcer/osteomyelitis:Wound care following. Discussion as above.  Cachexia/severe protein malnutrition:On tube feeds. Nutrition following.  Mild elevated LFTs:Continue to monitor.  Severe Protein Calorie Malnutrition: Due to chronic illness. Nutrition consulted.  Pressure Injury 02/14/20 Sacrum Medial Stage 4 - Full thickness tissue loss with exposed  bone, tendon or muscle. (Active)  02/14/20 1906  Location: Sacrum  Location Orientation: Medial  Staging: Stage 4 - Full thickness tissue loss with exposed bone, tendon or muscle.  Wound Description (Comments):   Present on Admission: Yes    Pressure Injury 03/03/20 Neck Anterior Stage 3 - Full thickness tissue loss. Subcutaneous fat may be visible but bone, tendon or muscle are NOT exposed. Device related injury under trach tube. (Active)  03/03/20 1805  Location: Neck  Location Orientation: Anterior  Staging: Stage 3 - Full thickness tissue loss. Subcutaneous fat may be visible but bone, tendon or muscle are NOT exposed.  Wound Description (Comments): Device related injury under trach tube.   Present on Admission: No   I have seen and examined this patient myself. I have spent 38 minutes in his evaluation and care.  DVT Prophylaxis: Eliquis  CODE STATUS: Full Code. 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 We have  requested for palliativecare evaluation.plz see note-trying to have discussion with  patient  Per palliative-family had a meeting and that they wish to continue all aggressive cares, including vent and dialysis. Per palliative care on 6/14 family is not interested in meeting with them anymore and have no interest in de-escalating care. Palliative care has signed off.  Family Communication: I spoke with the family last night. They are not present today. Disposition: Patient is from [facility]. Anticipate discharge to LTAC/SNF but due to lack of insurance, placement has been difficult. Social working Sports administrator.  Disposition Plan:  Status is: Inpatient Remains inpatient appropriate because:Unsafe d/c plan Dispo: The patient is from:[facility] Anticipated d/c is to: SNF/LTAC Anticipated d/c date is: Unknown Barriers to discharge: funding for LTAC placement/SNf  placement  Deni Berti, DO [facility] Direct contact: see [email or web address]  7PM-7AM contact night coverage as above 03/18/2020, 5:29 PM  LOS: 28 days

## 2020-03-18 NOTE — Progress Notes (Signed)
[facility] Progress Note Patient Name: Shane Banks DOB: 1945/12/30 MRN: [id number]   Date of Service  03/18/2020  HPI/Events of Note  AFIB with ventricular rate = 96-109. K+ this AM = 3.1. However, patient has history of ESRD on HD. Will not replace K+ at this time. Patient also having frequent PVC's. Currently on Metoprolol per tube and IV. BP = 115/91.    eICU Interventions  Will order: 1. Mg++ level STAT.  2. Continue Metoprolol IV.  3. Increased Metoprolol to 25 mg per tube Q 12 hours.      Intervention Category Major Interventions: Arrhythmia - evaluation and management  Maryn Freelove Cornelia Copa 03/18/2020, 8:35 PM

## 2020-03-18 NOTE — Progress Notes (Signed)
This RN spoke with the patient's sister Ms. Winona Legato this morning around 0900 and gave an update.The sister called back the unit a few minutes ago to ask for an update on patient but this RN was providing patient care for another patient and was unable to speak with Ms. Winona Legato. The unit secretary informed her that I would give her a call back at my earliest convenience. I just called her back at 1816 but there was no answer. Voicemail was provided instructing her I was returning her phone call and would give her another update whenever she is able to call back.

## 2020-03-18 NOTE — Progress Notes (Signed)
[facility] Progress Note  Subjective: no significant change overnight-  Still on vent- due for HD today   Vitals:   03/18/20 0700 03/18/20 0753 03/18/20 0800 03/18/20 0803  BP: 110/70  104/61   Pulse: 87  89   Resp: (!) 25  (!) 28   Temp:  98.4 F (36.9 C)    TempSrc:  Oral    SpO2: 100%  98% 100%  Weight:      Height:        Exam:   In ICU on vent-  Looks at you-  Nods to questions   no jvd  Chest coarse BS bilat  Cor reg no RG  Abd soft ntnd no ascites, +llq ostomy, +peg tube   Ext mild pitting  LE edema   Nonfocal, +gen'd weakness         Summary: came from [facility] to [facility] after COVID hospitalization which included mech ventilation and trach placement. At [facility] had severe infections/ bacteremia/ PNA and developed AKI felt due to IV gent toxicity requiring initiation of HD 12/16/19. Admitted to ICU [facility] for trach bleed on 5/19. Was getting MWF HD at [facility] prior to admit here.   OP HD: started March 2021 at [facility] as above MWF   Getting HD here 3.5- 4h, UF 0.5- 1.5 L, hep 2000 prn   admit 62.8kg > range 56.9- 64.7kg here    Assessment/ Plan: 1. 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 Was on the floor for a while, went back to ICU and back on vent this weekend.  Full code now.  Is on 40% FiO2. CXR bilat infiltrates, continues on broad-spec IV abx. Concern is for ongoing aspiration 2. Enterococcus/ MRSE bacteremia HD cath sepsis - TDC removed 5/26, per ID getting 6 wks vanc/ fortaz/ flagyl for polymicrobial cath sepsis and sacral osteomyelitis.  3. Sacral osteo/ decub stage IV - as per #2 4. ESRD - on HD since mid March 2021, d/t gent toxicity. HD MWF at [facility]. S/P new TDC 6/2 by IR. Next HD today.  5. BP/vol - no sig vol excess now by exam, wt's stable around 60kg, unable to UF on HD this past week 6. Trach bleed - resolved 7. Tracheobronchitis - sp course of doxycycline 8. AMS - variable,  seems overall improved 9. Atrial fib - per primary, on eliquis 2.5 BID 10. Anemia ckd - transfuse prn, Hb remains 9- 10 range. Needs ESA 11. Hypercalcemia:  w/ low PTH, prob from immobility, corrCa 11.0 > 10.5>10.3, improved. Cont low 2.0 Ca++ bath , PTH 29. If recurs will consider pamidronate or similar. 12. SP peg/ divert colostomy 13. SP COVID pna - original problem in Feb at OSH 14. DNR/ poor prognosis-  LTACH only option for disposition given needs but apparently not a candidate. Family asked not to be contacted by PCT anymore. PCT have now signed off.  For now cont plan of care.    Louis Meckel  03/18/2020, 9:23 AM   Recent Labs  Lab 03/12/20 0728 03/13/20 0351 03/15/20 0304 03/15/20 0304 03/15/20 2204 03/16/20 0629 03/17/20 0410 03/18/20 0428  K 4.6   < > 3.7   < > 3.5  --   --  3.1*  BUN 188*   < > 57*  --   --   --   --  112*  CREATININE 4.44*   < > 2.23*  --   --   --   --  3.78*  CALCIUM 10.6*   < > 9.0  --   --   --   --  9.1  PHOS 4.9*  --   --   --   --   --   --  3.9  HGB  --    < > 9.8*   < > 10.5*   < > 8.1* 8.1*   < > = values in this interval not displayed.   Inpatient medications: . apixaban  2.5 mg Per Tube BID  . chlorhexidine gluconate (MEDLINE KIT)  15 mL Mouth Rinse BID  . Chlorhexidine Gluconate Cloth  6 each Topical Q0600  . Chlorhexidine Gluconate Cloth  6 each Topical Q0600  . Chlorhexidine Gluconate Cloth  6 each Topical Q0600  . Chlorhexidine Gluconate Cloth  6 each Topical Q0600  . feeding supplement (PRO-STAT SUGAR FREE 64)  30 mL Per Tube BID  . heparin sodium (porcine)      . insulin aspart  0-6 Units Subcutaneous Q4H  . insulin aspart  2 Units Subcutaneous Q4H  . levothyroxine  25 mcg Per Tube Q0600  . mouth rinse  15 mL Mouth Rinse 10 times per day  . metoprolol tartrate  12.5 mg Per Tube BID  . metroNIDAZOLE  500 mg Per Tube Q8H  . nutrition supplement (JUVEN)  1 packet Per Tube BID BM  . pantoprazole sodium  40 mg Per  Tube Daily  . vancomycin  500 mg Intravenous Q T,Th,Sa-HD   . sodium chloride Stopped (03/09/20 1302)  . cefTAZidime (FORTAZ)  IV Stopped (03/16/20 2101)  . feeding supplement (NEPRO CARB STEADY) 50 mL/hr at 03/18/20 0800  . lactated ringers 50 mL/hr at 03/18/20 0800   sodium chloride, acetaminophen (TYLENOL) oral liquid 160 mg/5 mL, docusate, fentaNYL (SUBLIMAZE) injection, heparin, [START ON 03/19/2020] heparin, ipratropium-albuterol, metoprolol tartrate, polyethylene glycol

## 2020-03-18 NOTE — Progress Notes (Signed)
SLP Cancellation Note  Patient Details Name: Carvell Hoeffner MRN: [id number] DOB: 1946/06/04   Cancelled treatment:       Reason Eval/Treat Not Completed: Patient not medically ready. Back on ventilator. Will follow for readiness.    Korey Arroyo, Katherene Ponto 03/18/2020, 12:38 PM

## 2020-03-19 LAB — CBC WITH DIFFERENTIAL/PLATELET
Abs Immature Granulocytes: 0.18 10*3/uL — ABNORMAL HIGH (ref 0.00–0.07)
Basophils Absolute: 0.1 10*3/uL (ref 0.0–0.1)
Basophils Relative: 1 %
Eosinophils Absolute: 0.5 10*3/uL (ref 0.0–0.5)
Eosinophils Relative: 3 %
HCT: 25.8 % — ABNORMAL LOW (ref 39.0–52.0)
Hemoglobin: 8.2 g/dL — ABNORMAL LOW (ref 13.0–17.0)
Immature Granulocytes: 1 %
Lymphocytes Relative: 15 %
Lymphs Abs: 2.1 10*3/uL (ref 0.7–4.0)
MCH: 25.8 pg — ABNORMAL LOW (ref 26.0–34.0)
MCHC: 31.8 g/dL (ref 30.0–36.0)
MCV: 81.1 fL (ref 80.0–100.0)
Monocytes Absolute: 1.6 10*3/uL — ABNORMAL HIGH (ref 0.1–1.0)
Monocytes Relative: 11 %
Neutro Abs: 9.5 10*3/uL — ABNORMAL HIGH (ref 1.7–7.7)
Neutrophils Relative %: 69 %
Platelets: 192 10*3/uL (ref 150–400)
RBC: 3.18 MIL/uL — ABNORMAL LOW (ref 4.22–5.81)
RDW: 18.2 % — ABNORMAL HIGH (ref 11.5–15.5)
WBC: 13.9 10*3/uL — ABNORMAL HIGH (ref 4.0–10.5)
nRBC: 0 % (ref 0.0–0.2)

## 2020-03-19 LAB — GLUCOSE, CAPILLARY
Glucose-Capillary: 128 mg/dL — ABNORMAL HIGH (ref 70–99)
Glucose-Capillary: 133 mg/dL — ABNORMAL HIGH (ref 70–99)
Glucose-Capillary: 135 mg/dL — ABNORMAL HIGH (ref 70–99)
Glucose-Capillary: 144 mg/dL — ABNORMAL HIGH (ref 70–99)
Glucose-Capillary: 146 mg/dL — ABNORMAL HIGH (ref 70–99)
Glucose-Capillary: 147 mg/dL — ABNORMAL HIGH (ref 70–99)
Glucose-Capillary: 158 mg/dL — ABNORMAL HIGH (ref 70–99)

## 2020-03-19 LAB — PROCALCITONIN: Procalcitonin: 2.06 ng/mL

## 2020-03-19 MED ORDER — NEPRO/CARBSTEADY PO LIQD
1000.0000 mL | ORAL | Status: DC
  Administered 2020-03-19 – 2020-03-25 (×9): 1000 mL
  Filled 2020-03-19 (×7): qty 1000

## 2020-03-19 MED ORDER — VANCOMYCIN HCL IN DEXTROSE 500-5 MG/100ML-% IV SOLN
500.0000 mg | INTRAVENOUS | Status: AC
  Administered 2020-03-25 – 2020-04-01 (×2): 500 mg via INTRAVENOUS
  Filled 2020-03-19 (×7): qty 100

## 2020-03-19 MED ORDER — PRO-STAT SUGAR FREE PO LIQD
30.0000 mL | Freq: Three times a day (TID) | ORAL | Status: DC
  Administered 2020-03-19 – 2020-03-25 (×18): 30 mL
  Filled 2020-03-19 (×17): qty 30

## 2020-03-19 NOTE — Progress Notes (Signed)
PROGRESS NOTE  Shane Banks GMW:[id number] DOB: Feb 09, 1946 DOA: 02/14/2020 PCP: [name], MD  Brief History   Patient is a 74 year old male with past medical history of chronic hypoxic respiratory failure status post tracheostomy, ESRD on dialysis, atrial fibrillation, nonhemorrhagic stroke who was admitted from [facility] for the evaluation of trach site bleeding. Patient had recent history of Covid pneumonia and was on ventilator because he progressed to ARDS. Because of little improvement in the ventilatory and oxygenation, he underwent tracheostomy and was discharged to [facility]. He had recent history of UTI, multidrug-resistant Pseudomonas pneumonia, stenotrophomonas bacteremia. Because of sepsis and shock, he developed renal failure requiring dialysis. On 5/19, he developed hemoptysis and was transferred to [facility] ED for evaluation. PCCM,ID,Nephrology following here. 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 He also underwent tracheostomy tube change by PCCM for persistent leak. Due to lack of insurance, he cannot go back to [facility]. Social worker assisting on placement. Anticipate prolonged hospitalization.  1/8 Admit to [facility] for COVID PNA 2/23 Transfer to [facility] on vent 5/19 Transfer to [facility] for hemoptysis. #6 cuffed trach placed. Back on vent. Bronch performed 5/20 Transitioned to trach collar.  5/22 back on vent full time 5/24 back on antibiotics for fevers 6/1 Initiating PSV weans 6/3 Large cuff leak, trach exchange 6/7 transitioned to [facility] 6/9 remains on TC continuously since 6/7 03/15/2020: Pt returned to ICU for mechanical ventilation and pressors. Pt with sharply decreased saturations while in HD. Patient was requiring pressor support in ICU initially. He is now off of them, but continues to require vent support. Plan is to again wean him to trach collar.  Consultants   Palliative care  Wound care  Interventional  Radiology  Infectious Disease  Nephrology  Neurosurgery  ENT  PCCM  Procedures   Mechanical Ventilation  Tracheostomy  Trach exchange  Transition to trach collar  Flexible laryngeal endoscopy - no source of bleeding seen.  Bronchoscopy  Tunnelled catheter placement by IR  HD  Antibiotics   Anti-infectives (From admission, onward)   Start     Dose/Rate Route Frequency Ordered Stop   03/25/20 1800  cefTAZidime (FORTAZ) 1 g in sodium chloride 0.9 % 100 mL IVPB     Discontinue    "Followed by" Linked Group Details   1 g 200 mL/hr over 30 Minutes Intravenous Every 24 hours 03/18/20 1015 04/05/20 1759   03/20/20 1200  vancomycin (VANCOCIN) IVPB 500 mg/100 ml premix     Discontinue     500 mg 100 mL/hr over 60 Minutes Intravenous Every M-W-F (Hemodialysis) 03/19/20 0941     03/18/20 1800  meropenem (MERREM) 500 mg in sodium chloride 0.9 % 100 mL IVPB     Discontinue    "Followed by" Linked Group Details   500 mg 200 mL/hr over 30 Minutes Intravenous Every 24 hours 03/18/20 1015 03/25/20 1759   03/18/20 1200  vancomycin (VANCOCIN) IVPB 500 mg/100 ml premix        500 mg 100 mL/hr over 60 Minutes Intravenous Every M-W-F (Hemodialysis) 03/18/20 0938 03/18/20 1700   03/18/20 1200  cefTAZidime (FORTAZ) 2 g in sodium chloride 0.9 % 100 mL IVPB  Status:  Discontinued        2 g 200 mL/hr over 30 Minutes Intravenous Every Mon (Hemodialysis) 03/18/20 0939 03/18/20 1015   03/07/20 1800  cefTAZidime (FORTAZ) 2 g in sodium chloride 0.9 % 100 mL IVPB  Status:  Discontinued        2 g  200 mL/hr over 30 Minutes Intravenous Every T-Th-Sa (1800) 03/06/20 1255 03/18/20 1015   03/07/20 1200  vancomycin (VANCOCIN) IVPB 500 mg/100 ml premix  Status:  Discontinued        500 mg 100 mL/hr over 60 Minutes Intravenous Every T-Th-Sa (Hemodialysis) 03/06/20 1255 03/19/20 0941   03/04/20 2000  cefTAZidime (FORTAZ) 2 g in sodium chloride 0.9 % 100 mL IVPB  Status:  Discontinued        2 g 200  mL/hr over 30 Minutes Intravenous Every M-W-F (2000) 03/01/20 1136 03/06/20 1255   03/02/20 1200  vancomycin (VANCOCIN) IVPB 500 mg/100 ml premix        500 mg 100 mL/hr over 60 Minutes Intravenous Every T-Th-Sa (Hemodialysis) 03/01/20 1131 03/02/20 1710   03/01/20 1230  vancomycin (VANCOCIN) IVPB 500 mg/100 ml premix  Status:  Discontinued        500 mg 100 mL/hr over 60 Minutes Intravenous Every M-W-F (Hemodialysis) 03/01/20 1131 03/06/20 1255   02/28/20 1110  vancomycin (VANCOREADY) IVPB 500 mg/100 mL        over 60 Minutes  Continuous PRN 02/28/20 1112 02/28/20 1110   02/28/20 1101  vancomycin (VANCOCIN) 1-5 GM/200ML-% IVPB  Status:  Discontinued       Note to Pharmacy: Lytle Butte   : cabinet override      02/28/20 1101 02/28/20 1327   02/27/20 1800  cefTAZidime (FORTAZ) 1 g in sodium chloride 0.9 % 100 mL IVPB        1 g 200 mL/hr over 30 Minutes Intravenous Every 24 hours 02/27/20 0947 03/03/20 1738   02/27/20 1200  vancomycin (VANCOCIN) IVPB 500 mg/100 ml premix        500 mg 100 mL/hr over 60 Minutes Intravenous Once 02/27/20 0829 02/27/20 1343   02/24/20 1800  vancomycin (VANCOREADY) IVPB 500 mg/100 mL        500 mg 100 mL/hr over 60 Minutes Intravenous  Once 02/24/20 1308 02/24/20 1905   02/22/20 2200  metroNIDAZOLE (FLAGYL) tablet 500 mg     Discontinue     500 mg Per Tube Every 8 hours 02/22/20 0957 04/04/20 2359   02/22/20 1700  cefTRIAXone (ROCEPHIN) 2 g in sodium chloride 0.9 % 100 mL IVPB  Status:  Discontinued        2 g 200 mL/hr over 30 Minutes Intravenous Every 24 hours 02/22/20 0957 02/27/20 0947   02/22/20 1200  vancomycin (VANCOCIN) IVPB 500 mg/100 ml premix  Status:  Discontinued        500 mg 100 mL/hr over 60 Minutes Intravenous Every T-Th-Sa (Hemodialysis) 02/20/20 1253 02/21/20 1040   02/21/20 1431  vancomycin variable dose per unstable renal function (pharmacist dosing)  Status:  Discontinued         Does not apply See admin instructions 02/21/20 1431  03/01/20 1131   02/21/20 1200  vancomycin (VANCOREADY) IVPB 500 mg/100 mL        500 mg 100 mL/hr over 60 Minutes Intravenous  Once 02/21/20 0956 02/21/20 1253   02/21/20 0714  vancomycin (VANCOCIN) 500-5 MG/100ML-% IVPB  Status:  Discontinued       Note to Pharmacy: Ashley Akin   : cabinet override      02/21/20 0714 02/21/20 1346   02/20/20 1400  vancomycin (VANCOREADY) IVPB 1250 mg/250 mL        1,250 mg 166.7 mL/hr over 90 Minutes Intravenous  Once 02/20/20 1253 02/20/20 1538   02/20/20 1200  levofloxacin (LEVAQUIN) IVPB 500 mg  Status:  Discontinued        500 mg 100 mL/hr over 60 Minutes Intravenous Every 48 hours 02/20/20 1020 02/21/20 0943   02/15/20 1545  vancomycin (VANCOCIN) IVPB 1000 mg/200 mL premix  Status:  Discontinued       "Followed by" Linked Group Details   1,000 mg 200 mL/hr over 60 Minutes Intravenous Every 24 hours 02/14/20 1530 02/14/20 1745   02/15/20 0330  ceFEPIme (MAXIPIME) 2 g in sodium chloride 0.9 % 100 mL IVPB  Status:  Discontinued        2 g 200 mL/hr over 30 Minutes Intravenous Every 12 hours 02/14/20 1531 02/14/20 1531   02/14/20 1531  ceFEPIme (MAXIPIME) 2 g in sodium chloride 0.9 % 100 mL IVPB  Status:  Discontinued        2 g 200 mL/hr over 30 Minutes Intravenous Every 12 hours 02/14/20 1531 02/14/20 1745   02/14/20 1530  ceFEPIme (MAXIPIME) 2 g in sodium chloride 0.9 % 100 mL IVPB  Status:  Discontinued        2 g 200 mL/hr over 30 Minutes Intravenous  Once 02/14/20 1520 02/14/20 1531   02/14/20 1530  vancomycin (VANCOREADY) IVPB 1500 mg/300 mL  Status:  Discontinued       "Followed by" Linked Group Details   1,500 mg 150 mL/hr over 120 Minutes Intravenous  Once 02/14/20 1530 02/14/20 1745        Subjective  The patient is resting comfortably.   Objective   Vitals:  Vitals:   03/19/20 1531 03/19/20 1600  BP:  94/61  Pulse:  88  Resp:  (!) 24  Temp: 97.8 F (36.6 C)   SpO2:  100%   Exam:  Constitutional:   Patient is  awake, nonverbal, ventilated. Neck:   neck appears normal, no masses, normal ROM, supple  no thyromegaly  Trach stable. Respiratory:   No increased work of breathing.  No wheezes, rales, or rhonchi  No tactile fremitus  Coarse breath sounds bilaterally Cardiovascular:   Regular rate and rhythm  No murmurs, ectopy, or gallups.  No lateral PMI. No thrills. Abdomen:   Abdomen is soft, non-tender, non-distended  No hernias, masses, or organomegaly  Normoactive bowel sounds.  Musculoskeletal:   No cyanosis, clubbing  Mild edema Skin:   No rashes, lesions, ulcers  palpation of skin: no induration or nodules Neurologic:   Patient is unable to cooperate with exam. Psychiatric:  Patient is unable to cooperate with exam.  I have personally reviewed the following:   Today's Data   Vitals, CBC, BMP, Glucoses, Procalcitonin, Lactic Acid  Micro Data   Blood cultures x 2 (03/03/2020) No growth  Scheduled Meds:  apixaban  2.5 mg Per Tube BID   chlorhexidine gluconate (MEDLINE KIT)  15 mL Mouth Rinse BID   Chlorhexidine Gluconate Cloth  6 each Topical Q0600   Chlorhexidine Gluconate Cloth  6 each Topical Q0600   Chlorhexidine Gluconate Cloth  6 each Topical Q0600   Chlorhexidine Gluconate Cloth  6 each Topical Q0600   darbepoetin (ARANESP) injection - DIALYSIS  100 mcg Intravenous Q Mon-HD   feeding supplement (NEPRO CARB STEADY)  1,000 mL Per Tube Q24H   feeding supplement (PRO-STAT SUGAR FREE 64)  30 mL Per Tube TID   insulin aspart  0-6 Units Subcutaneous Q4H   insulin aspart  2 Units Subcutaneous Q4H   levothyroxine  25 mcg Per Tube Q0600   mouth rinse  15 mL Mouth Rinse 10 times per day  metoprolol tartrate  25 mg Per Tube BID   metroNIDAZOLE  500 mg Per Tube Q8H   nutrition supplement (JUVEN)  1 packet Per Tube BID BM   pantoprazole sodium  40 mg Per Tube Daily   [START ON 03/20/2020] vancomycin  500 mg Intravenous Q M,W,F-HD    Continuous Infusions:  sodium chloride Stopped (03/09/20 1302)   meropenem (MERREM) IV Stopped (03/18/20 1805)   Followed by   Derrill Memo ON 03/25/2020] cefTAZidime (FORTAZ)  IV      Principal Problem:   Enterococcal bacteremia Active Problems:   Acute respiratory failure (HCC)   Hemoptysis   Pressure injury of skin   Protein-calorie malnutrition, severe   Decubitus ulcer of sacral region, stage 4 (HCC)   Fever   Palliative care by specialist   Goals of care, counseling/discussion   DNR (do not resuscitate)   LOS: 34 days   A & P  Acute on chronic hypoxemic respiratory failure requiring tracheostomy, prolonged YN:[id number] to post COVID fibrosis, deconditioning and multiple HCAPs.  Status post trach due to respiratory failure from Covid pneumonia.He ison trach collar.PCCM following. Most recently pneumonia has been multidrug-resistant Pseudomonas for which she was treated with Avycaz and mycin nebulizers at [facility]. Possible ongoing aspiration also. Presented with hemoptysis, aspiration of blood. Patient has cuffed trach placed by ENT.No active bleeding on bronchoscopy. He also underwent tracheostomy tube change by PCCM for persistent leak on 03/01/20. Chest x-ray done on 03/03/2020 showed bilateral pleural effusions, stable extensive opacity throughout the left hemithorax. Currently he is on trach collar, requiring 8 L of oxygen per minute. He continues to receive duonebs and CPT. Volume control per HD. The patient was been taken back to HD on the afternoon of 03/15/2020. He was returned to the floor in extremis with saturations in the 70's on NRB. Rapid Response called. Family rescinded DNR and stated that they (Mother, Sister, and brother) that they wished for him to be ventilated. I apprecate Dr. Jorja Loa help. He continues to require vent support. Plan is to   HCAP: Pt with purulent secretions from trach. WBC is elevated as is procalcitonin. Lactic acid is negative. The patient is  receiving Vanc, Flagyl, and Merrem.  Sepsis: Hypotension, tachycardia, elevated WBC. Assumed to be due to HCAP with worsening Lt>Rt infiltrates on CXR. The patient is receiving Vanc, Flagyl, and Merrem.  CVA: Last night on physical exam the patient appeared to have suffed a catastrophic CVA. Eyes were sharply deviated to the right, and he was unresponsive. Initial CT head was negative for new stroke, but did demonstrate an age indeterminate basal ganglia stroke. MRI brain has been ordered.  Paroxysmal A. HYQ:MVHQIONGE rate is controlled. Anticoagulation was discontinued due to suspected GI bleed. He is currently on low-dose Eliquis. Added low dose toprol xl 12.5 mg qd for improved rate control.  Sepsis/bacteremia:Enterococcus and Staphylococcus epidermidis bacteremia HD cath sepsis: TDC removed 5/26. Also has osteomyelitis fifth sacral segment. There is high suspicion for ongoing aspiration. MRI pelvis/sacrum showed osteomyelitis of fifth sacral segment, edema in the muscle around both hips and proximal thighs with possible myositis. ID had recommended treatment for 6 weeks for sacral osteomyelitis and staph epidermidis bacteremia with vancomycin, fortaz and metronidazole. IR consulted for bone biopsy but due to high risk it was not done. He has persistent leukocytosis. Continue to monitor CBC.  Will repeat blood cultrues. Respiratory cultures from 03/16/2020 have had no growth. Blood cultures x 2 have been repeated.   ESRD:on HD since mid March 2021. AKI  is due to gent toxicity. HD MWF at [facility]. S/P new Tunnel catheter placement on  6/2 by IR. HD today again. Now back to MWF.  Chronic normocytic anemia: Secondary to ESRD, critical illness. Has been transfused with PRBCs. Currently hemoglobin stable. Continue to monitor. No HD yesterday due to acute drop in saturations.  Unstageable sacral pressure ulcer/osteomyelitis:Wound care following. Discussion as above.  Cachexia/severe protein  malnutrition:On tube feeds. Nutrition following.  Mild elevated LFTs:Continue to monitor.  Severe Protein Calorie Malnutrition: Due to chronic illness. Nutrition consulted.  Pressure Injury 02/14/20 Sacrum Medial Stage 4 - Full thickness tissue loss with exposed bone, tendon or muscle. (Active)  02/14/20 1906  Location: Sacrum  Location Orientation: Medial  Staging: Stage 4 - Full thickness tissue loss with exposed bone, tendon or muscle.  Wound Description (Comments):   Present on Admission: Yes    Pressure Injury 03/03/20 Neck Anterior Stage 3 - Full thickness tissue loss. Subcutaneous fat may be visible but bone, tendon or muscle are NOT exposed. Device related injury under trach tube. (Active)  03/03/20 1805  Location: Neck  Location Orientation: Anterior  Staging: Stage 3 - Full thickness tissue loss. Subcutaneous fat may be visible but bone, tendon or muscle are NOT exposed.  Wound Description (Comments): Device related injury under trach tube.   Present on Admission: No   I have seen and examined this patient myself. I have spent 38 minutes in his evaluation and care.  DVT Prophylaxis: Eliquis  CODE STATUS: Full Code. 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 We have  requested for palliativecare evaluation.plz see note-trying to have discussion with  patient  Per palliative-family had a meeting and that they wish to continue all  aggressive cares, including vent and dialysis. Per palliative care on 6/14 family is not  interested in meeting with them anymore and have no interest in de-escalating  care. Palliative care has signed off.  Family Communication: I spoke with the family on the night that the patient had  respiratory arrest. They have not been back to visit the patient. The mother, brother,  and sister all stated that they wanted the patient back on the vent. They rescinded  his DNR.  They are not present today either. Disposition: Patient is from [place]. Anticipate discharge to LTAC/SNF but due to lack of insurance, placement has been difficult. Social working Sports administrator.  Disposition Plan:  Status is: Inpatient Remains inpatient appropriate because:Unsafe d/c plan Dispo: The patient is from:[facility] Anticipated d/c is to: SNF/LTAC Anticipated d/c date is: Unknown Barriers to discharge: funding for LTAC placement/SNf placement  Shane Plemons, DO [facility] Direct contact: see [email or web address]  7PM-7AM contact night coverage as above 03/19/2020, 4:40 PM  LOS: 28 days

## 2020-03-19 NOTE — Progress Notes (Signed)
Nutrition Follow-up  RD working remotely.  DOCUMENTATION CODES:   Underweight, Severe malnutrition in context of chronic illness  INTERVENTION:  Initiate new goal TF regimen of Nepro at 40 mL/hr (960 mL goal daily volume) + Pro-Stat 30 mL TID per G-tube. Provides 2028 kcal, 123 grams of protein, 701 mL H2O daily.  Continue Juven 1 packet per G-tube BID. Each packet provides 95 kcal, 7 grams L-Arginine, 7 grams L-Glutamine, 2.5 grams collagen protein, 300 mg vitamin C, 9.5 mg zinc, and other micronutrients essential for wound healing.  Total regimen including Juven provides 2218 kcal daily.  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  Ongoing - addressing with TF regimen.  GOAL:   Patient will meet greater than or equal to 90% of their needs  Met with TF regimen.  MONITOR:   Diet advancement, Labs, Weight trends, TF tolerance, Skin, I & O's  REASON FOR ASSESSMENT:   Ventilator, Other (Comment) (hx PEG)    ASSESSMENT:   74 yo male admitted with hemoptysis. 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   -Patient returned to ICU on 6/18 for increased secretions requiring mechanical ventilation.  Patient is currently on ventilator support via trach MV: 13.6 L/min Temp (24hrs), Avg:99.8 F (37.7 C), Min:98.1 F (36.7 C), Max:101.8 F (38.8 C)  Medications reviewed and include: Novolog 0-6 units Q4hrs, Novolog 2 units Q4hrs, levothyroxine, Flagyl, Juven BID, Protonix, vancomycin, meropenem.  Labs reviewed: CBG 128-146. On 6/21 Sodium 134, Potassium 3.1, BUN 112, Creatinine 3.78.  Enteral Access: G-tube placed surgically 10/30/2019  TF regimen: Nepro at 50 mL/hr + Pro-Stat 30 mL BID + Juven BID  I/O: 325 mL UOP yesterday (0.2 mL/kg/hr)  Weight trend: 58.8 kg on  6/22; remains fairly weight-stable with fluctuations between 57-61 kg  Discussed with RN via secure chat. Patient continues to tolerate tube feeds. Patient failed SBT today.  Diet Order:   Diet Order            Diet NPO time specified  Diet effective now                EDUCATION NEEDS:   No education needs have been identified at this time  Skin:  Skin Assessment: Skin Integrity Issues: Skin Integrity Issues:: Stage II, Stage IV DTI: N/A Stage II: head Stage III: N/A Stage IV: sacrum (11cm x 8cm x 1.5cm) Incisions: N/A  Last BM:  03/19/2020 - medium type 7 per colostomy  Height:   Ht Readings from Last 1 Encounters:  03/18/20 _0  (1.753 m)   Weight:   Wt Readings from Last 1 Encounters:  03/19/20 58.8 kg   Ideal Body Weight:  72.7 kg  BMI:  Body mass index is 19.14 kg/m.  Estimated Nutritional Needs:   Kcal:  1900-2200  Protein:  110-135 grams  Fluid:  1 L + UOP  Jacklynn Barnacle, MS, RD, LDN Pager number available on Amion

## 2020-03-19 NOTE — Progress Notes (Signed)
NAME:  Shane Banks, MRN:  [id number], DOB:  1946-06-18, LOS: 58 ADMISSION DATE:  02/14/2020, CONSULTATION DATE:  5/19 REFERRING MD:  Dr. Alvino Chapel, CHIEF COMPLAINT:  Hemoptysis   Brief History   74 yo m with PMHx of afib with RVR, BPH, stroke, and COVID ARDS in January requiring trach and LTACH placement at [facility]. Had been tolerating trach collar, but on 5/19 developed hemoptysis and hypoxemic respiratory failure requiring vent. Transferred to [facility] on 5/19.  Past Medical History   has a past medical history of Acute on chronic respiratory failure with hypoxia ([place]), Atrial fibrillation with RVR ([place]), BPH (benign prostatic hyperplasia), COVID-19 virus infection, Pneumonia due to COVID-19 virus, Severe sepsis ([place]), and Stroke ([place]).  Significant Hospital Events   1/8  Admit to [facility] for COVID PNA 2/23 Transfer to [facility] on vent 5/19 Transfer to [facility] for hemoptysis. #6 cuffed trach placed. Back on vent. Bronch performed 5/20 Transitioned to trach collar.  5/22 back on vent full time 5/24 back on antibiotics for fevers 6/1 Initiating PSV weans 6/3 Large cuff leak, trach exchange 6/7 transitioned to [facility] 6/9 remains on TC continuously since 6/7  Consults:  ENT Nephrology ID [id number]  Procedures:  ENT flex scope 5/19 > no bleeding source identified FOB 5/19 > no pulmonary bleeding source identified.   Significant Diagnostic Tests:  CXR 5/28 >> R CVC placement confirmed Slightly improved aeration at the right base. 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 Edema in the muscles around the hips and in the posterior paraspinal musculature of the lower lumbar spine and in the buttocks which could represent myositis. Fluid-fluid level in the bladder may represent protein or debris in  the bladder. The possibility of urinary tract infection should be considered.  CXR 6/3> improved R lung aeration, worsening L sided opacity. Tracheostomy tube, HD catheter.   CXR 6/4>wosened R lung infiltrates and stable L sided infiltrate. Possible bilateral pleural effusions   Micro Data:    tracheal aspirate 5/7> for Pseudomonas A  BAL 5/19 > no growth 5/19 blood > NG 5/22 trach aspirate>> candida parasipolis 5/22 blood>> 1/4 GPC> staph epi 5/25 blood>> GPC (enterococcus on biofire) 2/4 bottles>> E. Faecalis, staph epi. 5/25 trach aspirate>>many PMN, few GPR, rare GPC> diphtheroids 5/25 blood cx>> + for Enterococcus Faecalis (gent resistant)/ Staphylococcus Epidermidis (tetracycline, vanc, rifampin sensitive) 5/27 BCx> ngtd    Antimicrobials:  Levofloxacin 5/24>5/26 vanc 5/25> Ceftriaxone  5/27>6/1 Metronidazole 5/27>  [name] 6/1> Meropenem 6/21  To continue antibiotics up until 04/04/2020  Interim history/subjective:  Returned to ICU 6/19 for increased secretions requiring mechanical ventilation No overnight events, currently on vent  Objective   Blood pressure 107/71, pulse 91, temperature 99.1 F (37.3 C), temperature source Axillary, resp. rate (!) 26, height _0  (1.753 m), weight 58.8 kg, SpO2 100 %.    Vent Mode: PRVC FiO2 (%):  [40 %] 40 % Set Rate:  [15 bmp] 15 bmp Vt Set:  [480 mL-560 mL] 560 mL PEEP:  [5 cmH20] 5 cmH20 Plateau Pressure:  [23 cmH20-31 cmH20] 29 cmH20   Intake/Output Summary (Last 24 hours) at 03/19/2020 1018 Last data filed at 03/19/2020 0800 Gross per 24 hour  Intake 700 ml  Output 950 ml  Net -250 ml   Filed Weights   03/18/20 0423 03/18/20 1250 03/19/20 0500  Weight: 61.7 kg  61.7 kg 58.8 kg    Examination:   Chronically ill, on vent Trach-tan secretions Chest: Coarse breath sounds, clear anteriorly CVS: S1-S2 appreciated Nondistended, PEG in place ostomy in place positive bowel sounds 1+ lower extremity edema Not following  commands  Chest x-ray 6/18 reviewed-slight worsening of basilar infiltrates Resolved Hospital Problem list     Assessment & Plan:   Acute on chronic hypoxemic respiratory failure requiring tracheostomy, prolonged MV COVID fibrosis,  Deconditioning multiple HCAPs. 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 -On antibiotics -Appreciate infectious disease and pharmacy input  End-stage renal disease on hemodialysis. -Continue hemodialysis  Limited options for further intervention.  Family has been resistant to placing any limitations in care.  No significant progress at present   Daily Goals Checklist  Pain/Anxiety/Delirium protocol (if indicated): As needed only VAP protocol (if indicated): Bundle in place Respiratory support goals: Daily SBT/trach collar trial Blood pressure target: Keep MAP greater than 65 titrate low-dose norepinephrine DVT prophylaxis: Apixaban Nutrition Status: High nutritional risk.  On tube feeds. GI prophylaxis: Pantoprazole Fluid status goals: Likely volume contracted.  Gentle hydration. Urinary catheter: Condom catheter Central lines: Chronic HD catheter. Glucose control: Currently euvolemic Mobility/therapy needs: Bedbound at baseline Antibiotic de-escalation: On prolonged course of ceftazidime metronidazole and vancomycin for chronic osteomyelitis. Home medication reconciliation: None Daily labs: CBC BMP Code Status: Full code Family Communication: Family updated by hospitalist 6/19 Disposition: ICU.  The patient is critically ill with multiple organ systems failure and requires high complexity decision making for assessment and support, frequent evaluation and  titration of therapies, application of advanced monitoring technologies and extensive interpretation of multiple databases. Critical Care Time devoted to patient care services described in this note independent of APP/resident time (if applicable)  is 32 minutes.   Sherrilyn Rist MD [place] [facility] Personal pager: [phone] If unanswered, please page CCM On-call: [phone]

## 2020-03-19 NOTE — Progress Notes (Signed)
Physical Therapy Treatment Patient Details Name: Shane Banks MRN: [id number] DOB: [birth year] Today's Date: 03/19/2020    History of Present Illness 74 yo m with PMHx of afib with RVR, BPH, stroke, and COVID ARDS in January requiring trach and LTACH placement at [facility]. Had been tolerating trach collar, but on 5/19 developed hemoptysis and hypoxemic respiratory failure requiring vent. Transferred to [facility] on 5/19.  5/19 back on vent, 6/7 transitioned to [facility]    PT Comments    Emphasis on mobilizing in the bed, repositioning and making comfortable after and extensive, collaborative ROM and session of prolonged stretch for contracture management.  Time spent on contracture management and rolling, in lieu of supported sitting EOB.    Follow Up Recommendations  SNF;Supervision/Assistance - 24 hour     Equipment Recommendations  Other (comment) (TBA)    Recommendations for Other Services       Precautions / Restrictions Precautions Precautions: Fall Precaution Comments: trach-- vent support Restrictions Weight Bearing Restrictions: No    Mobility  Bed Mobility Overal bed mobility: Needs Assistance Bed Mobility: Rolling Rolling: Total assist         General bed mobility comments: pt does not make any attempts to assist, but limbs are guided into normalized movement  Transfers                    Ambulation/Gait                 Stairs             Wheelchair Mobility    Modified Rankin (Stroke Patients Only)       Balance                                            Cognition Arousal/Alertness: Awake/alert Behavior During Therapy: Flat affect Overall Cognitive Status: Difficult to assess                                 General Comments: pt slow to respond, he appears to understand questions, but "zones" out without constant stimuli.      Exercises Other Exercises Other Exercises: OT  completed PROM at fingers, wrists, elbow, shoulders and scapular, with prolonged stretch for contracture .  Pt assist with UE support and positioning for scapular work.   Other Exercises: PROM to bil LE's at hips, knees ankles/heel cord bil with prolonged stretch to all joints for contracture management Other Exercises: cervical rotation with prolonged stretch as pt tolerated.    General Comments General comments (skin integrity, edema, etc.): vss, HR in the upper 80's, sats at or near 100% on vent support      Pertinent Vitals/Pain Pain Assessment: Faces Faces Pain Scale: Hurts even more Pain Location: generalize with prolonged stretch Pain Descriptors / Indicators: Discomfort;Grimacing Pain Intervention(s): Monitored during session;Repositioned    Home Living                      Prior Function            PT Goals (current goals can now be found in the care plan section) Acute Rehab PT Goals Patient Stated Goal: pt unable to participate PT Goal Formulation: With patient Time For Goal Achievement: 03/20/20 Potential to Achieve Goals: Fair Progress towards PT  goals: Not progressing toward goals - comment (no functional changes noted.)    Frequency    Min 2X/week      PT Plan Current plan remains appropriate    Co-evaluation PT/OT/SLP Co-Evaluation/Treatment: Yes Reason for Co-Treatment: Complexity of the patient's impairments (multi-system involvement);To address functional/ADL transfers PT goals addressed during session: Mobility/safety with mobility;Strengthening/ROM OT goals addressed during session: Strengthening/ROM      AM-PAC PT "6 Clicks" Mobility   Outcome Measure  Help needed turning from your back to your side while in a flat bed without using bedrails?: Total Help needed moving from lying on your back to sitting on the side of a flat bed without using bedrails?: Total Help needed moving to and from a bed to a chair (including a wheelchair)?:  Total Help needed standing up from a chair using your arms (e.g., wheelchair or bedside chair)?: Total Help needed to walk in hospital room?: Total Help needed climbing 3-5 steps with a railing? : Total 6 Click Score: 6    End of Session Equipment Utilized During Treatment: Oxygen Activity Tolerance: Patient tolerated treatment well Patient left: in bed;with call bell/phone within reach;with nursing/sitter in room;with bed alarm set Nurse Communication: Mobility status PT Visit Diagnosis: Muscle weakness (generalized) (M62.81);Adult, failure to thrive (R62.7);Other abnormalities of gait and mobility (R26.89);Pain Pain - part of body:  (all joints/Connective tissue around joints)     Time: 1031-5945 PT Time Calculation (min) (ACUTE ONLY): 33 min  Charges:  $Therapeutic Exercise: 8-22 mins                     03/19/2020  [name], PT Acute Rehabilitation Services [phone]  (pager) [phone]  (office)   [name] 03/19/2020, 11:26 AM

## 2020-03-19 NOTE — Progress Notes (Signed)
[facility] Progress Note  Subjective: no significant change overnight-  HD yest without volume removal-  Some lower BPs overnight but better this AM-  Shaking his head but not really responding to my direct questions  Vitals:   03/19/20 0700 03/19/20 0749 03/19/20 0800 03/19/20 0812  BP: 106/64  114/66   Pulse: 88  88   Resp: 17  (!) 26   Temp:    99.1 F (37.3 C)  TempSrc:    Axillary  SpO2: 100% 100% 100%   Weight:      Height:        Exam:   In ICU on vent-  Looks at you-  Only shaking head no today   no jvd  Chest coarse BS bilat  Cor reg no RG  Abd soft ntnd no ascites, +llq ostomy, +peg tube   Ext mild pitting  LE edema   Nonfocal, +gen'd weakness         Summary: came from [facility] to [facility] after COVID hospitalization which included mech ventilation and trach placement. At [facility] had severe infections/ bacteremia/ PNA and developed AKI felt due to IV gent toxicity requiring initiation of HD 12/16/19. Admitted to ICU [facility] for trach bleed on 5/19. Was getting MWF HD at [facility] prior to admit here.   OP HD: started March 2021 at [facility] as above MWF   Getting HD here 3.5- 4h, UF 0.5- 1.5 L, hep 2000 prn   admit 62.8kg > range 56.9- 64.7kg here    Assessment/ Plan: 1. 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 Was on the floor for a while, went back to ICU and back on vent this weekend.  Full code now.  Is on 40% FiO2. CXR bilat infiltrates, continues on broad-spec IV abx. Concern is for ongoing aspiration 2. Enterococcus/ MRSE bacteremia HD cath sepsis - TDC removed 5/26, per ID getting 6 wks vanc/ fortaz/ flagyl for polymicrobial cath sepsis and sacral osteomyelitis.  3. Sacral osteo/ decub stage IV - as per #2 4. ESRD - on HD since mid March 2021, d/t gent toxicity. HD MWF at [facility]. S/P new TDC 6/2 by IR. Next HD tomorrow.  5. BP/vol - no sig vol excess now by exam, wt's stable around 60kg, unable  to UF on HD this past week 6. Hypokalemia-  Ran on high K bath yesterday-  Will order daily labs and treat as needed    7. AMS - variable, seems overall improved 8. Atrial fib - per primary, on eliquis 2.5 BID 9. Anemia ckd - transfuse prn, Hb remains 9- 10 range. started ESA 10. Hypercalcemia:  w/ low PTH, prob from immobility, corrCa 11.0 > 10.5>10.3, improved. Cont low 2.0 Ca++ bath , PTH 29. If recurs will consider pamidronate or similar. 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 SP COVID pna - original problem in Feb at OSH 13. DNR/ poor prognosis-  LTACH only option for disposition given needs but apparently not a candidate. Family asked not to be contacted by PCT anymore. PCT have now signed off.  For now cont plan of care.    Louis Meckel  03/19/2020, 8:55 AM   Recent Labs  Lab 03/15/20 0304 03/15/20 0304 03/15/20 2204 03/16/20 0629 03/18/20 0428 03/19/20 0201  K 3.7   < > 3.5  --  3.1*  --   BUN 57*  --   --   --  112*  --  CREATININE 2.23*  --   --   --  3.78*  --   CALCIUM 9.0  --   --   --  9.1  --   PHOS  --   --   --   --  3.9  --   HGB 9.8*   < > 10.5*   < > 8.1* 8.2*   < > = values in this interval not displayed.   Inpatient medications:  apixaban  2.5 mg Per Tube BID   chlorhexidine gluconate (MEDLINE KIT)  15 mL Mouth Rinse BID   Chlorhexidine Gluconate Cloth  6 each Topical Q0600   Chlorhexidine Gluconate Cloth  6 each Topical Q0600   Chlorhexidine Gluconate Cloth  6 each Topical Q0600   Chlorhexidine Gluconate Cloth  6 each Topical Q0600   darbepoetin (ARANESP) injection - DIALYSIS  100 mcg Intravenous Q Mon-HD   feeding supplement (PRO-STAT SUGAR FREE 64)  30 mL Per Tube BID   insulin aspart  0-6 Units Subcutaneous Q4H   insulin aspart  2 Units Subcutaneous Q4H   levothyroxine  25 mcg Per Tube Q0600   mouth rinse  15 mL Mouth Rinse 10 times per day   metoprolol tartrate  25 mg Per Tube BID   metroNIDAZOLE  500 mg Per Tube Q8H    nutrition supplement (JUVEN)  1 packet Per Tube BID BM   pantoprazole sodium  40 mg Per Tube Daily   vancomycin  500 mg Intravenous Q T,Th,Sa-HD    sodium chloride Stopped (03/09/20 1302)   meropenem (MERREM) IV Stopped (03/18/20 1805)   Followed by   Derrill Memo ON 03/25/2020] cefTAZidime (FORTAZ)  IV     feeding supplement (NEPRO CARB STEADY) 50 mL/hr at 03/18/20 1100   lactated ringers Stopped (03/18/20 0954)   sodium chloride, acetaminophen (TYLENOL) oral liquid 160 mg/5 mL, docusate, fentaNYL (SUBLIMAZE) injection, heparin, heparin, ipratropium-albuterol, metoprolol tartrate, polyethylene glycol

## 2020-03-19 NOTE — Evaluation (Addendum)
Occupational Therapy Treatment Patient Details Name: Shane Banks MRN: [id number] DOB: Apr 21, 1946 Today's Date: 03/19/2020    History of Present Illness 74 yo m with PMHx of afib with RVR, BPH, stroke, and COVID ARDS in January requiring trach and LTACH placement at [facility]. Had been tolerating trach collar, but on 5/19 developed hemoptysis and hypoxemic respiratory failure requiring vent. Transferred to [facility] on 5/19.  5/19 back on vent, 6/7 transitioned to TC   Clinical Impression   Pt agreeable to working with therapies. He continues to present with tightness and decreased ROM in bil UE/LEs. Much of session focus on prolonged stretch and PROM to reduce risk for further contractures. Completed in both supine as well as in sidelying for further scapular/UE mobilization. Pt with resting hand splints donned start of session - removed and noted edema in bil hands which improved during session completion. Re-donned splints end of session and will plan to follow up for splint check this PM to ensure pt tolerating splint wear/establishe splint schedule. VSS today on vent-trach support (FiO2 40%, PEEP 5). Will continue per POC.     Follow Up Recommendations  SNF;LTACH;Supervision/Assistance - 24 hour    Equipment Recommendations  Other (comment) (TBA)           Precautions / Restrictions Precautions Precautions: Fall Precaution Comments: trach-- vent support Restrictions Weight Bearing Restrictions: No      Mobility Bed Mobility Overal bed mobility: Needs Assistance Bed Mobility: Rolling Rolling: Total assist         General bed mobility comments: pt does not make any attempts to assist, but limbs are guided into normalized movement  Transfers                 General transfer comment: deferred     Balance                                           ADL either performed or assessed with clinical judgement   ADL Overall ADL's : Needs  assistance/impaired                                       General ADL Comments: pt remains totalA for ADL at this time                         Pertinent Vitals/Pain Pain Assessment: Faces Faces Pain Scale: Hurts even more Pain Location: generalize with prolonged stretch Pain Descriptors / Indicators: Discomfort;Grimacing Pain Intervention(s): Monitored during session;Repositioned     Hand Dominance     Extremity/Trunk Assessment             Communication     Cognition Arousal/Alertness: Awake/alert Behavior During Therapy: Flat affect Overall Cognitive Status: Difficult to assess                                 General Comments: pt slow to respond, he appears to understand questions, but "zones" out without constant stimuli.   General Comments  VSS, HR in the upper 80's, sats at or near 100% on vent support (40% FiO2, PEEP 5)     Exercises Exercises: Other exercises Other Exercises Other Exercises: OT completed PROM at fingers, wrists,  elbow, shoulders and scapular, with prolonged stretch for contracture .  PT assisted with UE support and positioning in sidelying for further scapular work. Pt with very limited flexion in [place] Other Exercises: PROM to bil LE's at hips, knees ankles/heel cord bil with prolonged stretch to all joints for contracture management Other Exercises: cervical rotation with prolonged stretch as pt tolerated. Other Exercises: donned resting hand splints end of session   Shoulder Instructions      Home Living                                          Prior Functioning/Environment                   OT Problem List:        OT Treatment/Interventions:      OT Goals(Current goals can be found in the care plan section) Acute Rehab OT Goals Patient Stated Goal: pt unable to participate OT Goal Formulation: Patient unable to participate in goal setting Time For Goal Achievement:  04/02/20 Potential to Achieve Goals: Fair ADL Goals Pt Will Perform Upper Body Bathing: with max assist;bed level Additional ADL Goal #1: Demonstrate increased of R shoulder P/AAROM to 75 degrees after using mobilization techniques to increase functional use RUE Additional ADL Goal #2: Pt will complete hand to mouth pattern with mod A using L UE inpreparation for ADL tasks Additional ADL Goal #3: pt will tolerate bilateral resting hand splints 4 hours on/off schedule to maintain a stretch in hands and increase functional use.  OT Frequency: Min 2X/week   Barriers to D/C:            Co-evaluation PT/OT/SLP Co-Evaluation/Treatment: Yes Reason for Co-Treatment: Complexity of the patient's impairments (multi-system involvement);To address functional/ADL transfers PT goals addressed during session: Mobility/safety with mobility;Strengthening/ROM OT goals addressed during session: Strengthening/ROM      AM-PAC OT "6 Clicks" Daily Activity     Outcome Measure Help from another person eating meals?: Total Help from another person taking care of personal grooming?: Total Help from another person toileting, which includes using toliet, bedpan, or urinal?: Total Help from another person bathing (including washing, rinsing, drying)?: Total Help from another person to put on and taking off regular upper body clothing?: Total Help from another person to put on and taking off regular lower body clothing?: Total 6 Click Score: 6   End of Session Equipment Utilized During Treatment: Oxygen Nurse Communication: Mobility status  Activity Tolerance: Patient tolerated treatment well Patient left: in bed;with call bell/phone within reach;with SCD's reapplied  OT Visit Diagnosis: Other abnormalities of gait and mobility (R26.89);Muscle weakness (generalized) (M62.81);Other symptoms and signs involving cognitive function;Pain Pain - part of body: Shoulder;Arm;Hand;Hip;Knee;Leg                Time:  1540-0867 OT Time Calculation (min): 33 min Charges:  OT General Charges $OT Visit: 1 Visit OT Treatments $Therapeutic Activity: 8-22 mins  Shane Banks, OT Acute Rehabilitation Services Pager [phone] Office [phone]   Shane Banks 03/19/2020, 1:17 PM

## 2020-03-19 NOTE — Progress Notes (Signed)
Pharmacy Antibiotic Note  Shane Banks is a 74 y.o. male admitted on 02/14/2020 with CoNS/E.faecalis bacteremia/sacral osteomyelitis/pneumonia. 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 Line pulled 5/26, line holiday and had it replaced 5/28. TTE negative. Repeat BCx are negative. ID planning 6 weeks of therapy.  Pseudomonas has grown in respiratory cx resistant to ceftaz  Planned HD tomorrow  Height: 5\' 9"  (175.3 cm) Weight: 58.8 kg (129 lb 10.1 oz) IBW/kg (Calculated) : 70.7  Temp (24hrs), Avg:99.1 F (37.3 C), Min:98.1 F (36.7 C), Max:100.2 F (37.9 C)  Recent Labs  Lab 03/14/20 0457 03/14/20 0457 03/15/20 0304 03/16/20 0629 03/17/20 0410 03/17/20 2032 03/18/20 0428 03/19/20 0201  WBC 22.1*   < > 21.4* 21.7* 17.9*  --  14.7* 13.9*  CREATININE 3.60*  --  2.23*  --   --   --  3.78*  --   LATICACIDVEN  --   --   --   --   --  1.0 1.3  --   VANCORANDOM 20  --   --   --   --   --   --   --    < > = values in this interval not displayed.    Estimated Creatinine Clearance: 14.7 mL/min (A) (by C-G formula based on SCr of 3.78 mg/dL (H)).    Allergies  Allergen Reactions  . Aspirin Rash  . Cephalexin Other (See Comments)    dizziness  . Penicillins     Itching,swelling    Plan: Continue Vanc 500mg  IV qHD - scheduled for MWF with HD Meropenem 500 mg q24h x 1 week Then return to fortaz 1 g q24h to complete course through 7/8 vanc level mid-week  [name], PharmD, BCPS, BCCCP Clinical Pharmacist [phone]  Please check AMION for all [place] numbers  03/19/2020 9:42 AM

## 2020-03-19 NOTE — Progress Notes (Addendum)
Occupational Therapy Treatment Patient Details Name: Shane Banks MRN: [id number] DOB: 06-Aug-1946 Today's Date: 03/19/2020    History of present illness 74 yo m with PMHx of afib with RVR, BPH, stroke, and COVID ARDS in January requiring trach and LTACH placement at [facility]. Had been tolerating trach collar, but on 5/19 developed hemoptysis and hypoxemic respiratory failure requiring vent. Transferred to [facility] on 5/19.  5/19 back on vent, 6/7 transitioned to [facility]   OT comments  Pt seen for splint check and to establish splint wear schedule of bil resting hand splints. Pt wearing splints approx 4 hrs today. Noted some mild edema after removal of splint, no redness noted. Elevated UEs end of session with splints doffed to reduce swelling and improvements noted end of session. Issued splint wear schedule with limited wear time (<4hrs) to reduce chance for further swelling or adverse reaction. Will continue to follow/monitor for pt progress and tolerance to splint wear. Will continue per POC at this time.    Follow Up Recommendations  SNF;LTACH;Supervision/Assistance - 24 hour    Equipment Recommendations  Other (comment) (TBA)          Precautions / Restrictions Precautions Precautions: Fall Precaution Comments: trach-- vent support Restrictions Weight Bearing Restrictions: No       Mobility                   Transfers                 General transfer comment: deferred         ADL either performed or assessed with clinical judgement   ADL Overall ADL's : Needs assistance/impaired                                       General ADL Comments: splint check completed this session and initiated splint schedule. pt with some edema noted after wear time approx 4hrs, improved with splints doffed/UEs elevated. Started pt with a limited splint schedule (shorter wear time) to reduce risk for further swelling/skin breakdown                         Cognition Arousal/Alertness: Awake/alert Behavior During Therapy: Flat affect Overall Cognitive Status: Difficult to assess                                 General Comments: pt slow to respond, he appears to understand questions, but "zones" out without constant stimuli.        Exercises Exercises: Other exercises Other Exercises Other Exercises: gentle stretching to bil hands and elevated for edema management after removal of splints   Shoulder Instructions       General Comments VSS on trach-vent support     Pertinent Vitals/ Pain       Pain Assessment: Faces Faces Pain Scale: Hurts even more Pain Location: generalized with certain UE movements Pain Descriptors / Indicators: Discomfort;Grimacing Pain Intervention(s): Monitored during session;Repositioned  Home Living                                          Prior Functioning/Environment              Frequency  Min  2X/week        Progress Toward Goals  OT Goals(current goals can now be found in the care plan section)  Progress towards OT goals: Progressing toward goals  Acute Rehab OT Goals Patient Stated Goal: pt unable to participate OT Goal Formulation: Patient unable to participate in goal setting Time For Goal Achievement: 04/02/20 Potential to Achieve Goals: Fair ADL Goals Pt Will Perform Upper Body Bathing: with max assist;bed level Additional ADL Goal #1: Demonstrate increased of R shoulder P/AAROM to 75 degrees after using mobilization techniques to increase functional use RUE Additional ADL Goal #2: Pt will complete hand to mouth pattern with mod A using L UE inpreparation for ADL tasks Additional ADL Goal #3: pt will tolerate bilateral resting hand splints 4 hours on/off schedule to maintain a stretch in hands and increase functional use.  Plan Discharge plan remains appropriate    Co-evaluation                 AM-PAC OT "6 Clicks" Daily  Activity     Outcome Measure   Help from another person eating meals?: Total Help from another person taking care of personal grooming?: Total Help from another person toileting, which includes using toliet, bedpan, or urinal?: Total Help from another person bathing (including washing, rinsing, drying)?: Total Help from another person to put on and taking off regular upper body clothing?: Total Help from another person to put on and taking off regular lower body clothing?: Total 6 Click Score: 6    End of Session Equipment Utilized During Treatment: Oxygen  OT Visit Diagnosis: Other abnormalities of gait and mobility (R26.89);Muscle weakness (generalized) (M62.81);Other symptoms and signs involving cognitive function;Pain Pain - part of body: Shoulder;Arm;Hand;Hip;Knee;Leg   Activity Tolerance Patient tolerated treatment well   Patient Left in bed;with call bell/phone within reach;with SCD's reapplied   Nurse Communication Mobility status        Time: 6314-9702 OT Time Calculation (min): 21 min  Charges: OT General Charges $OT Visit: 1 Visit OT Treatments $Orthotics/Prosthetics Check: 8-22 mins  Shane Banks, OT Acute Rehabilitation Services Pager [phone] Office [phone]    Shane Banks 03/19/2020, 5:28 PM

## 2020-03-20 DIAGNOSIS — L89154 Pressure ulcer of sacral region, stage 4: Secondary | ICD-10-CM

## 2020-03-20 DIAGNOSIS — J9611 Chronic respiratory failure with hypoxia: Secondary | ICD-10-CM

## 2020-03-20 LAB — CBC WITH DIFFERENTIAL/PLATELET
Abs Immature Granulocytes: 0.14 10*3/uL — ABNORMAL HIGH (ref 0.00–0.07)
Basophils Absolute: 0.1 10*3/uL (ref 0.0–0.1)
Basophils Relative: 0 %
Eosinophils Absolute: 0.8 10*3/uL — ABNORMAL HIGH (ref 0.0–0.5)
Eosinophils Relative: 5 %
HCT: 28.2 % — ABNORMAL LOW (ref 39.0–52.0)
Hemoglobin: 8.8 g/dL — ABNORMAL LOW (ref 13.0–17.0)
Immature Granulocytes: 1 %
Lymphocytes Relative: 16 %
Lymphs Abs: 2.3 10*3/uL (ref 0.7–4.0)
MCH: 25.6 pg — ABNORMAL LOW (ref 26.0–34.0)
MCHC: 31.2 g/dL (ref 30.0–36.0)
MCV: 82 fL (ref 80.0–100.0)
Monocytes Absolute: 1.8 10*3/uL — ABNORMAL HIGH (ref 0.1–1.0)
Monocytes Relative: 12 %
Neutro Abs: 9.2 10*3/uL — ABNORMAL HIGH (ref 1.7–7.7)
Neutrophils Relative %: 66 %
Platelets: 207 10*3/uL (ref 150–400)
RBC: 3.44 MIL/uL — ABNORMAL LOW (ref 4.22–5.81)
RDW: 18.2 % — ABNORMAL HIGH (ref 11.5–15.5)
WBC: 14.2 10*3/uL — ABNORMAL HIGH (ref 4.0–10.5)
nRBC: 0 % (ref 0.0–0.2)

## 2020-03-20 LAB — GLUCOSE, CAPILLARY
Glucose-Capillary: 97 mg/dL (ref 70–99)
Glucose-Capillary: 142 mg/dL — ABNORMAL HIGH (ref 70–99)
Glucose-Capillary: 131 mg/dL — ABNORMAL HIGH (ref 70–99)
Glucose-Capillary: 119 mg/dL — ABNORMAL HIGH (ref 70–99)
Glucose-Capillary: 108 mg/dL — ABNORMAL HIGH (ref 70–99)

## 2020-03-20 LAB — RENAL FUNCTION PANEL
Albumin: 1.8 g/dL — ABNORMAL LOW (ref 3.5–5.0)
Anion gap: 12 (ref 5–15)
BUN: 98 mg/dL — ABNORMAL HIGH (ref 8–23)
CO2: 25 mmol/L (ref 22–32)
Calcium: 9.4 mg/dL (ref 8.9–10.3)
Chloride: 100 mmol/L (ref 98–111)
Creatinine, Ser: 2.92 mg/dL — ABNORMAL HIGH (ref 0.61–1.24)
GFR calc Af Amer: 24 mL/min — ABNORMAL LOW (ref >60)
GFR calc non Af Amer: 20 mL/min — ABNORMAL LOW (ref >60)
Glucose, Bld: 109 mg/dL — ABNORMAL HIGH (ref 70–99)
Phosphorus: 2.6 mg/dL (ref 2.5–4.6)
Potassium: 3.5 mmol/L (ref 3.5–5.1)
Sodium: 137 mmol/L (ref 135–145)

## 2020-03-20 MED ORDER — PHENYLEPHRINE 40 MCG/ML (10ML) SYRINGE FOR IV PUSH (FOR BLOOD PRESSURE SUPPORT)
PREFILLED_SYRINGE | INTRAVENOUS | Status: AC
  Administered 2020-03-20: 100 ug
  Filled 2020-03-20: qty 10

## 2020-03-20 MED ORDER — ALBUMIN HUMAN 25 % IV SOLN
25.0000 g | Freq: Once | INTRAVENOUS | Status: AC
  Administered 2020-03-20: 25 g via INTRAVENOUS
  Filled 2020-03-20: qty 100

## 2020-03-20 MED ORDER — PHENYLEPHRINE 200 MCG/ML (NON-ED) FOR PRIAPISM
100.0000 ug | Freq: Once | INTRAMUSCULAR | Status: DC
  Filled 2020-03-20: qty 10

## 2020-03-20 MED ORDER — VANCOMYCIN HCL IN DEXTROSE 500-5 MG/100ML-% IV SOLN
INTRAVENOUS | Status: AC
  Administered 2020-03-20: 500 mg via INTRAVENOUS
  Filled 2020-03-20: qty 100

## 2020-03-20 NOTE — Progress Notes (Signed)
[facility] Progress Note  Subjective: no pressors but low BPs-  On trach collar this AM-  Due for dialysis today   Vitals:   03/20/20 0700 03/20/20 0728 03/20/20 0742 03/20/20 0800  BP: (!) 89/55   (!) 94/59  Pulse: 91  93 90  Resp: (!) 29  (!) 26 (!) 33  Temp:    98.9 F (37.2 C)  TempSrc:    Oral  SpO2: 98% 100% 97% 98%  Weight:      Height:        Exam:   In ICU on vent-  Looks at you-  Only shaking head no today   no jvd  Chest coarse BS bilat  Cor reg no RG  Abd soft ntnd no ascites, +llq ostomy, +peg tube   Ext mild pitting  LE edema   Nonfocal, +gen'd weakness        Summary: came from [facility] to [facility] after COVID hospitalization which included mech ventilation and trach placement. At [facility] had severe infections/ bacteremia/ PNA and developed AKI felt due to IV gent toxicity requiring initiation of HD 12/16/19. Admitted to ICU [facility] for trach bleed on 5/19. Was getting MWF HD at [facility] prior to admit here.   OP HD: started March 2021 at [facility] as above MWF   Getting HD here 3.5- 4h, UF 0.5- 1.5 L, hep 2000 prn   admit 62.8kg > range 56.9- 64.7kg here    Assessment/ Plan: 1. 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 Was on the floor for a while, went back to ICU and back on vent this past weekend.  Full code now.  Is on 40% FiO2. CXR bilat infiltrates, continues on broad-spec IV abx. Concern is for ongoing aspiration- now back on TC 2. Enterococcus/ MRSE bacteremia HD cath sepsis - TDC removed 5/26, per ID getting 6 wks vanc/ fortaz/ flagyl for polymicrobial cath sepsis and sacral osteomyelitis.  3. Sacral osteo/ decub stage IV - as per #2 4. ESRD - on HD since mid March 2021, d/t gent toxicity. HD MWF at [facility]. S/P new TDC 6/2 by IR. Next HD today.  5. BP/vol - no sig vol excess now by exam, wt's stable around 60kg, unable to UF on HD this past week 6. Hypokalemia-  Ran on high K bath  yesterday-  Will order daily labs and treat as needed    7. AMS - variable, seems overall improved 8. Atrial fib - per primary, on eliquis 2.5 BID 9. Anemia ckd - transfuse prn, Hb remains 9- 10 range. started ESA 10. Hypercalcemia:  w/ low PTH, prob from immobility, corrCa 11.0 > 10.5>10.3, improved. Cont low 2.0 Ca++ bath , PTH 29. If recurs will consider pamidronate or similar. 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 SP COVID pna - original problem in Feb at OSH 13. DNR/ poor prognosis-  LTACH only option for disposition given needs but apparently not a candidate. Family asked not to be contacted by PCT anymore. PCT have now signed off.  For now cont plan of care.    Louis Meckel  03/20/2020, 8:53 AM   Recent Labs  Lab 03/18/20 0428 03/18/20 0428 03/19/20 0201 03/20/20 0344  K 3.1*  --   --  3.5  BUN 112*  --   --  98*  CREATININE 3.78*  --   --  2.92*  CALCIUM 9.1  --   --  9.4  PHOS  3.9  --   --  2.6  HGB 8.1*   < > 8.2* 8.8*   < > = values in this interval not displayed.   Inpatient medications: . apixaban  2.5 mg Per Tube BID  . chlorhexidine gluconate (MEDLINE KIT)  15 mL Mouth Rinse BID  . Chlorhexidine Gluconate Cloth  6 each Topical Q0600  . Chlorhexidine Gluconate Cloth  6 each Topical Q0600  . Chlorhexidine Gluconate Cloth  6 each Topical Q0600  . Chlorhexidine Gluconate Cloth  6 each Topical Q0600  . darbepoetin (ARANESP) injection - DIALYSIS  100 mcg Intravenous Q Mon-HD  . feeding supplement (NEPRO CARB STEADY)  1,000 mL Per Tube Q24H  . feeding supplement (PRO-STAT SUGAR FREE 64)  30 mL Per Tube TID  . insulin aspart  0-6 Units Subcutaneous Q4H  . insulin aspart  2 Units Subcutaneous Q4H  . levothyroxine  25 mcg Per Tube Q0600  . mouth rinse  15 mL Mouth Rinse 10 times per day  . metoprolol tartrate  25 mg Per Tube BID  . metroNIDAZOLE  500 mg Per Tube Q8H  . nutrition supplement (JUVEN)  1 packet Per Tube BID BM  . pantoprazole sodium  40 mg Per  Tube Daily  . vancomycin      . vancomycin  500 mg Intravenous Q M,W,F-HD   . sodium chloride Stopped (03/09/20 1302)  . meropenem (MERREM) IV Stopped (03/19/20 1908)   Followed by  . [START ON 03/25/2020] cefTAZidime (FORTAZ)  IV     sodium chloride, acetaminophen (TYLENOL) oral liquid 160 mg/5 mL, docusate, fentaNYL (SUBLIMAZE) injection, heparin, heparin, ipratropium-albuterol, metoprolol tartrate, polyethylene glycol

## 2020-03-20 NOTE — Progress Notes (Signed)
  Speech Language Pathology Treatment: Shane Banks Speaking valve  Patient Details Name: Shane Banks MRN: [id number] DOB: [birth year] Today's Date: 03/20/2020 Time: 0175-1025 SLP Time Calculation (min) (ACUTE ONLY): 18 min  Assessment / Plan / Recommendation Clinical Impression  Session consisted of use of Passy-Muir speaking valve to facilitate increased use of respiratory function, upper airway working towards more normal exhalation pattern. His attention to speaker and verbal information was approximately 5 seconds and sometimes no response though suspect he has slowly made small improvements toward cognition/communication. Tolerated valve for entirety of session without back pressure from exhalation; midly audible secretions but could not mobilize with valve placement or cues to cough. Diaphragmatic support is significantly decreased. Phonated in single word responses in wet, hoarse and low intensity quality with phonation breaks. Therapy will focus on increasing respiratory support for intelligible communication. HR and SpO2 stable. RR fluctuates 28-41 which he does on vent as well per RT.     HPI HPI: 74 yo m with PMHx of afib with RVR, BPH, stroke, and COVID ARDS in January requiring trach and LTACH placement at [facility]. Had been tolerating trach collar, but on 5/19 developed hemoptysis and hypoxemic respiratory failure requiring vent. Transferred to [facility] on 5/19. PMSV desired for discussion with pt regarding goals of care.      SLP Plan  Continue with current plan of care       Recommendations         Patient may use Passy-Muir Speech Valve: During all therapies with supervision PMSV Supervision: Full MD: Please consider changing trach tube to : Smaller size;Cuffless (once off vent)         Oral Care Recommendations: Oral care QID Follow up Recommendations: LTACH SLP Visit Diagnosis: Aphonia (R49.1) Plan: Continue with current plan of care       GO                 [name] 03/20/2020, 11:10 AM  Orbie Pyo Colvin Caroli.Ed Risk analyst [phone] Office [phone]

## 2020-03-20 NOTE — Progress Notes (Signed)
PROGRESS NOTE  Shane Banks JTT:[id number] DOB: 26-Jan-1946 DOA: 02/14/2020 PCP: [name], MD  HPI/Recap of past 24 hours: Patient is a 74 year old male with past medical history of chronic hypoxic respiratory failure status post tracheostomy, ESRD on dialysis, atrial fibrillation, nonhemorrhagic stroke who was admitted from [facility] for the evaluation of trach site bleeding. Patient had recent history of Covid pneumonia and was on ventilator because he progressed to ARDS. Because of little improvement in the ventilatory and oxygenation, he underwent tracheostomy and was discharged to [facility]. He had recent history of UTI, multidrug-resistant Pseudomonas pneumonia, stenotrophomonas bacteremia. Because of sepsis and shock, he developed renal failure requiring dialysis. On 5/19, he developed hemoptysis and was transferred to [facility] ED for evaluation. PCCM,ID,Nephrology following here. 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 He also underwent tracheostomy tube change by PCCM for persistent leak. Due to lack of insurance, he cannot go back to [facility]. Social worker assisting on placement.   1/8 Admit to [facility] for COVID PNA 2/23 Transfer to [facility] on vent 5/19 Transfer to [facility] for hemoptysis. #6 cuffed trach placed. Back on vent. Bronch performed 5/20 Transitioned to trach collar.  5/22 back on vent full time 5/24 back on antibiotics for fevers 6/1 Initiating PSV weans 6/3 Large cuff leak, trach exchange 6/7 transitioned to [facility] 6/9 remains on TC continuously since 6/7 03/15/2020: Pt returned to ICU for mechanical ventilation and pressors. Pt with sharply decreased saturations while in HD. Patient was requiring pressor support in ICU initially. He is now off of them, but continues to require vent support. Plan is to again wean him to trach collar.  No events overnight.  Had some episodes of hypotension today with dialysis.  Patient  remains nonverbal and does not follow commands.  Assessment/Plan: Principal Problem:   Enterococcal bacteremia Active Problems:   Acute respiratory failure (HCC)   Hemoptysis   Pressure injury of skin   Protein-calorie malnutrition, severe   Decubitus ulcer of sacral region, stage 4 (HCC)   Fever   Palliative care by specialist   Goals of care, counseling/discussion   DNR (do not resuscitate) End-stage renal disease on dialysis   Acute on chronic hypoxemic respiratory failure requiring tracheostomy, prolonged ES:PQZRAQT to post COVID fibrosis, deconditioning and multiple HCAPs.  Status post trach due to respiratory failure from Covid pneumonia.He ison trach collar.PCCM following. Most recently pneumonia has been multidrug-resistant Pseudomonas for which she was treated with Avycaz and mycin nebulizers at [facility]. Possible ongoing aspiration also. Presented with hemoptysis, aspiration of blood. Patient has cuffed trach placed by ENT.No active bleeding on bronchoscopy. He also underwent tracheostomy tube change by PCCM for persistent leak on 03/01/20. Chest x-ray done on 03/03/2020 showed bilateral pleural effusions, stable extensive opacity throughout the left hemithorax. Currently he is on trach collar, requiring 8 L of oxygen per minute. He continues to receive duonebs and CPT. Volume control per HD. The patient was been taken back to HD on the afternoon of 03/15/2020. He was returned to the floor in extremis with saturations in the 70's on NRB. Rapid Response called. Family rescinded DNR and stated that they (Mother, Sister, and brother) that they wished for him to be ventilated.    HCAP: Pt with purulent secretions from trach. WBC is elevated as is procalcitonin. Lactic acid is negative. The patient is receiving Vanc, Flagyl, and Merrem.  Sepsis: Hypotension, tachycardia, elevated WBC. Assumed to be due to HCAP with worsening Lt>Rt infiltrates on CXR. The patient is receiving  Vanc, Flagyl,  and Merrem.  CVA: Last night on physical exam the patient appeared to have suffed a catastrophic CVA. Eyes were sharply deviated to the right, and he was unresponsive. Initial CT head was negative for new stroke, but did demonstrate an age indeterminate basal ganglia stroke. MRI brain has been ordered.  Paroxysmal A. MVE:HMCNOBSJG rate is controlled. Anticoagulation was discontinued due to suspected GI bleed. He is currently on low-dose Eliquis. Added low dose toprol xl 12.5 mg qd for improved rate control.  Heart rate in the low 90s  Sepsis/bacteremia:Enterococcus and Staphylococcus epidermidis bacteremia HD cath sepsis: TDC removed 5/26. Also has osteomyelitis fifth sacral segment. There is high suspicion for ongoing aspiration. MRI pelvis/sacrum showed osteomyelitis of fifth sacral segment, edema in the muscle around both hips and proximal thighs with possible myositis. ID had recommended treatment for 6 weeks for sacral osteomyelitis and staph epidermidis bacteremia with vancomycin, fortaz and metronidazole. IR consulted for bone biopsy but due to high risk it was not done. He has persistent leukocytosis. Continue to monitor CBC.  Will repeat blood cultrues. Respiratory cultures from 03/16/2020 have had no growth. Blood cultures x 2 have been repeated.   ESRD:on HD since mid March 2021. AKI is due to gent toxicity. HD MWF at [facility]. S/P new Tunnel catheter placement on 6/2 by IR.  Watching closely, monitoring for episodes of hypotension  Chronic normocytic anemia: Secondary to ESRD, critical illness. Has been transfused with PRBCs. Currently hemoglobin stable. Continue to monitor.   Unstageable sacral pressure ulcer/osteomyelitis:Wound care following.  See below.  Cachexia/severe protein malnutrition:On tube feeds. Nutrition following.  Mild elevated LFTs:Continue to monitor.  Severe Protein Calorie Malnutrition/underweight: Meets criteria as evidenced by severe muscle and  fat depletion.  Due to chronic illness.  Seen by nutrition.  On Nepro and Protostat 3 times daily.  Pressure Injury 02/14/20 Sacrum Medial Stage 4 - Full thickness tissue loss with exposed bone, tendon or muscle. (Active)  02/14/20 1906  Location: Sacrum  Location Orientation: Medial  Staging: Stage 4 - Full thickness tissue loss with exposed bone, tendon or muscle.  Wound Description (Comments):   Present on Admission: Yes    Pressure Injury 03/03/20 Neck Anterior Stage 3 - Full thickness tissue loss. Subcutaneous fat may be visible but bone, tendon or muscle are NOT exposed. Device related injury under trach tube. (Active)  03/03/20 1805  Location: Neck  Location Orientation: Anterior  Staging: Stage 3 - Full thickness tissue loss. Subcutaneous fat may be visible but bone, tendon or muscle are NOT exposed.  Wound Description (Comments): Device related injury under trach tube.   Present on Admission: No     Code Status: Full code  Family Communication: Left message for sister  Disposition Plan: Looking for long-term acute care facility   Consultants:  Palliative care  Wound care  Interventional Radiology  Infectious Disease  Nephrology  Neurosurgery  ENT  PCCM   Procedures:  Mechanical Ventilation  Tracheostomy  Trach exchange  Transition to trach collar  Flexible laryngeal endoscopy - no source of bleeding seen.  Bronchoscopy  Tunnelled catheter placement by IR  HD  Antimicrobials:  IV ceftaz 6/1-7/9  IV vancomycin Monday Wednesday Friday with dialysis 5/19 -present  IV meropenem 6/21-6/28  IV Flagyl 5/27-7/8  IV Rocephin 5/27-6/1  IV Levaquin 5/25-5/26  IV cefepime 5/19 only  DVT prophylaxis: Eliquis   Objective: Vitals:   03/20/20 1605 03/20/20 1610  BP: (!) 93/57 (!) 96/57  Pulse: 95 91  Resp: (!) 29  17  Temp:    SpO2: 100% 100%    Intake/Output Summary (Last 24 hours) at 03/20/2020 1648 Last data filed at  03/20/2020 1603 Gross per 24 hour  Intake 1080 ml  Output 683 ml  Net 397 ml   Filed Weights   03/19/20 0500 03/20/20 0418 03/20/20 1204  Weight: 58.8 kg 62.9 kg 62.9 kg   Body mass index is 20.48 kg/m.  Exam:   General: Remains not really responsive, does not follow commands  Cardiovascular: Regular rhythm, rate controlled  Respiratory: Mostly clear, poor inspiratory effort  Abdomen: Soft, nontender, nondistended, hypoactive bowel sounds  Musculoskeletal: No clubbing or cyanosis, trace pitting edema  Psychiatry: Minimally responsive, no evidence of acute psychosis   Data Reviewed: CBC: Recent Labs  Lab 03/16/20 0629 03/17/20 0410 03/18/20 0428 03/19/20 0201 03/20/20 0344  WBC 21.7* 17.9* 14.7* 13.9* 14.2*  NEUTROABS 17.5* 12.9* 10.9* 9.5* 9.2*  HGB 9.0* 8.1* 8.1* 8.2* 8.8*  HCT 30.3* 25.8* 25.7* 25.8* 28.2*  MCV 86.3 82.4 82.1 81.1 82.0  PLT 200 206 191 192 537   Basic Metabolic Panel: Recent Labs  Lab 03/14/20 0457 03/15/20 0304 03/15/20 2204 03/18/20 0428 03/18/20 2108 03/20/20 0344  NA 140 135 138 134*  --  137  K 3.9 3.7 3.5 3.1*  --  3.5  CL 98 97*  --  98  --  100  CO2 27 28  --  24  --  25  GLUCOSE 172* 178*  --  193*  --  109*  BUN 134* 57*  --  112*  --  98*  CREATININE 3.60* 2.23*  --  3.78*  --  2.92*  CALCIUM 9.8 9.0  --  9.1  --  9.4  MG  --  2.2  --   --  1.7  --   PHOS  --   --   --  3.9  --  2.6   GFR: Estimated Creatinine Clearance: 20.3 mL/min (A) (by C-G formula based on SCr of 2.92 mg/dL (H)). Liver Function Tests: Recent Labs  Lab 03/18/20 0428 03/20/20 0344  ALBUMIN 1.7* 1.8*   No results for input(s): LIPASE, AMYLASE in the last 168 hours. No results for input(s): AMMONIA in the last 168 hours. Coagulation Profile: No results for input(s): INR, PROTIME in the last 168 hours. Cardiac Enzymes: No results for input(s): CKTOTAL, CKMB, CKMBINDEX, TROPONINI in the last 168 hours. BNP (last 3 results) No results for  input(s): PROBNP in the last 8760 hours. HbA1C: No results for input(s): HGBA1C in the last 72 hours. CBG: Recent Labs  Lab 03/19/20 2334 03/20/20 0357 03/20/20 0807 03/20/20 1201 03/20/20 1537  GLUCAP 147* 97 142* 131* 119*   Lipid Profile: No results for input(s): CHOL, HDL, LDLCALC, TRIG, CHOLHDL, LDLDIRECT in the last 72 hours. Thyroid Function Tests: No results for input(s): TSH, T4TOTAL, FREET4, T3FREE, THYROIDAB in the last 72 hours. Anemia Panel: No results for input(s): VITAMINB12, FOLATE, FERRITIN, TIBC, IRON, RETICCTPCT in the last 72 hours. Urine analysis:    Component Value Date/Time   COLORURINE YELLOW 12/11/2019 1230   APPEARANCEUR HAZY (A) 12/11/2019 1230   LABSPEC 1.018 12/11/2019 1230   PHURINE 6.0 12/11/2019 1230   GLUCOSEU NEGATIVE 12/11/2019 1230   HGBUR NEGATIVE 12/11/2019 1230   BILIRUBINUR NEGATIVE 12/11/2019 1230   KETONESUR NEGATIVE 12/11/2019 1230   PROTEINUR 30 (A) 12/11/2019 1230   NITRITE NEGATIVE 12/11/2019 1230   LEUKOCYTESUR LARGE (A) 12/11/2019 1230   Sepsis Labs: _0 (procalcitonin:4,lacticidven:4)  ) Recent  Results (from the past 240 hour(s))  Culture, respiratory (non-expectorated)     Status: None   Collection Time: 03/16/20  6:02 AM   Specimen: Tracheal Aspirate; Respiratory  Result Value Ref Range Status   Specimen Description TRACHEAL ASPIRATE  Final   Special Requests NONE  Final   Gram Stain   Final    RARE WBC PRESENT, PREDOMINANTLY PMN NO ORGANISMS SEEN Performed at [facility], [place] [address] [place], [place] [address]    Culture MODERATE PSEUDOMONAS AERUGINOSA  Final   Report Status 03/18/2020 FINAL  Final   Organism ID, Bacteria PSEUDOMONAS AERUGINOSA  Final      Susceptibility   Pseudomonas aeruginosa - MIC*    CEFTAZIDIME >=64 RESISTANT Resistant     CIPROFLOXACIN >=4 RESISTANT Resistant     GENTAMICIN 8 INTERMEDIATE Intermediate     IMIPENEM 2 SENSITIVE Sensitive     CEFEPIME >=32 RESISTANT  Resistant     * MODERATE PSEUDOMONAS AERUGINOSA  Culture, blood (routine x 2)     Status: None (Preliminary result)   Collection Time: 03/17/20  8:32 PM   Specimen: BLOOD RIGHT HAND  Result Value Ref Range Status   Specimen Description BLOOD RIGHT HAND  Final   Special Requests   Final    BOTTLES DRAWN AEROBIC ONLY Blood Culture adequate volume   Culture   Final    NO GROWTH 3 DAYS Performed at [facility], [address], [place],  [address]    Report Status PENDING  Incomplete  Culture, blood (routine x 2)     Status: None (Preliminary result)   Collection Time: 03/17/20  8:39 PM   Specimen: BLOOD LEFT HAND  Result Value Ref Range Status   Specimen Description BLOOD LEFT HAND  Final   Special Requests   Final    BOTTLES DRAWN AEROBIC ONLY Blood Culture adequate volume   Culture   Final    NO GROWTH 3 DAYS Performed at [facility], [place] [address], [place],  [address]    Report Status PENDING  Incomplete      Studies: No results found.  Scheduled Meds:  apixaban  2.5 mg Per Tube BID   chlorhexidine gluconate (MEDLINE KIT)  15 mL Mouth Rinse BID   Chlorhexidine Gluconate Cloth  6 each Topical Q0600   Chlorhexidine Gluconate Cloth  6 each Topical Q0600   Chlorhexidine Gluconate Cloth  6 each Topical Q0600   Chlorhexidine Gluconate Cloth  6 each Topical Q0600   darbepoetin (ARANESP) injection - DIALYSIS  100 mcg Intravenous Q Mon-HD   feeding supplement (NEPRO CARB STEADY)  1,000 mL Per Tube Q24H   feeding supplement (PRO-STAT SUGAR FREE 64)  30 mL Per Tube TID   insulin aspart  0-6 Units Subcutaneous Q4H   insulin aspart  2 Units Subcutaneous Q4H   levothyroxine  25 mcg Per Tube Q0600   mouth rinse  15 mL Mouth Rinse 10 times per day   metoprolol tartrate  25 mg Per Tube BID   metroNIDAZOLE  500 mg Per Tube Q8H   nutrition supplement (JUVEN)  1 packet Per Tube BID BM   pantoprazole sodium  40 mg Per Tube Daily    phenylephrine 200 mcg / ml (NON-ED USE ONLY) injection  100 mcg Intracavernosal Once   vancomycin  500 mg Intravenous Q M,W,F-HD    Continuous Infusions:  sodium chloride Stopped (03/09/20 1302)   meropenem (MERREM) IV Stopped (03/19/20 1908)   Followed by   [  START ON 03/25/2020] cefTAZidime (FORTAZ)  IV       LOS: 35 days     Annita Brod, MD [facility]   03/20/2020, 4:48 PM

## 2020-03-20 NOTE — Progress Notes (Signed)
Informed about hypotension during dialysis  Give 100 mcg of Neo-Synephrine IV 25 g of albumin IV

## 2020-03-20 NOTE — Progress Notes (Signed)
Pts BP 65/46. Dialysis currently running but not pulling anything. Dr. Ander Slade informed. See orders.

## 2020-03-20 NOTE — Progress Notes (Signed)
NAME:  Shane Banks, MRN:  [id number], DOB:  11-01-1945, LOS: 38 ADMISSION DATE:  02/14/2020, CONSULTATION DATE:  5/19 REFERRING MD:  Dr. Alvino Banks, CHIEF COMPLAINT:  Hemoptysis   Brief History   74 yo m with PMHx of afib with RVR, BPH, stroke, and COVID ARDS in January requiring trach and LTACH placement at [facility]. Had been tolerating trach collar, but on 5/19 developed hemoptysis and hypoxemic respiratory failure requiring vent. Transferred to [facility] on 5/19.  Past Medical History   has a past medical history of Acute on chronic respiratory failure with hypoxia ([place]), Atrial fibrillation with RVR ([place]), BPH (benign prostatic hyperplasia), COVID-19 virus infection, Pneumonia due to COVID-19 virus, Severe sepsis ([place]), and Stroke ([place]).  Significant Hospital Events   1/8  Admit to [facility] for COVID PNA 2/23 Transfer to [facility] on vent 5/19 Transfer to [facility] for hemoptysis. #6 cuffed trach placed. Back on vent. Bronch performed 5/20 Transitioned to trach collar.  5/22 back on vent full time 5/24 back on antibiotics for fevers 6/1 Initiating PSV weans 6/3 Large cuff leak, trach exchange 6/7 transitioned to [facility] 6/9 remains on TC continuously since 6/7 6/23-tolerating trach collar today  Consults:  ENT Nephrology ID PCCM  Procedures:  ENT flex scope 5/19 > no bleeding source identified FOB 5/19 > no pulmonary bleeding source identified.   Significant Diagnostic Tests:  CXR 5/28 >> R CVC placement confirmed Slightly improved aeration at the right base. 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 Edema in the muscles around the hips and in the posterior paraspinal musculature of the lower lumbar spine and in the buttocks which could represent myositis. Fluid-fluid level in the bladder  may represent protein or debris in the bladder. The possibility of urinary tract infection should be considered.  CXR 6/3> improved R lung aeration, worsening L sided opacity. Tracheostomy tube, HD catheter.   CXR 6/4>wosened R lung infiltrates and stable L sided infiltrate. Possible bilateral pleural effusions   Micro Data:    tracheal aspirate 5/7> for Pseudomonas A  BAL 5/19 > no growth 5/19 blood > NG 5/22 trach aspirate>> candida [place] 5/22 blood>> 1/4 GPC> staph epi 5/25 blood>> GPC (enterococcus on biofire) 2/4 bottles>> E. Faecalis, staph epi. 5/25 trach aspirate>>many PMN, few GPR, rare GPC> diphtheroids 5/25 blood cx>> + for Enterococcus Faecalis (gent resistant)/ Staphylococcus Epidermidis (tetracycline, vanc, rifampin sensitive) 5/27 BCx> ngtd    Antimicrobials:  Levofloxacin 5/24>5/26 vanc 5/25> Ceftriaxone  5/27>6/1 Metronidazole 5/27>  [name] 6/1> Meropenem 6/21  To continue antibiotics up until 04/04/2020  Interim history/subjective:  Returned to ICU 6/19 for increased secretions requiring mechanical ventilation No overnight events, currently on ATM this morning appears to be tolerating it okay Requiring suctioning  T-max 101.8  Objective   Blood pressure (!) 110/58, pulse 88, temperature 98.9 F (37.2 C), temperature source Oral, resp. rate (!) 33, height _0  (1.753 m), weight 62.9 kg, SpO2 98 %.    Vent Mode: PRVC FiO2 (%):  [40 %] 40 % Set Rate:  [15 bmp] 15 bmp Vt Set:  [560 mL] 560 mL PEEP:  [5 cmH20] 5 cmH20 Plateau Pressure:  [24 cmH20-30 cmH20] 29 cmH20   Intake/Output Summary (Last 24 hours) at 03/20/2020 1007 Last data filed at 03/20/2020 0800 Gross per 24 hour  Intake 1160 ml  Output 750 ml  Net 410  ml   Filed Weights   03/18/20 1250 03/19/20 0500 03/20/20 0418  Weight: 61.7 kg 58.8 kg 62.9 kg    Examination:   Chronically ill, tolerated ATM today Trach-tan secretions Chest: Rhonchi that clears with suctioning CVS: S1-S2  appreciated Nondistended, PEG in place ostomy in place positive bowel sounds 1+ lower extremity edema Answering questions, interactive  Chest x-ray 6/18 reviewed-slight worsening of basilar infiltrates Resolved Hospital Problem list     Assessment & Plan:   Acute on chronic hypoxemic respiratory failure requiring tracheostomy, prolonged MV COVID fibrosis,  Deconditioning multiple HCAPs. 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 -On antibiotics -Appreciate infectious disease and pharmacy input  End-stage renal disease on hemodialysis. -Continue hemodialysis  Limited options for further intervention.  Family has been resistant to placing any limitations in care.  No significant progress at present   Daily Goals Checklist  Pain/Anxiety/Delirium protocol (if indicated): As needed only VAP protocol (if indicated): Bundle in place Respiratory support goals: Daily SBT/trach collar trial Blood pressure target: Keep MAP greater than 65 titrate low-dose norepinephrine DVT prophylaxis: Apixaban Nutrition Status: High nutritional risk.  On tube feeds. GI prophylaxis: Pantoprazole Fluid status goals: Likely volume contracted.  Gentle hydration. Urinary catheter: Condom catheter Central lines: Chronic HD catheter. Glucose control: Currently euvolemic Mobility/therapy needs: Bedbound at baseline Antibiotic de-escalation: On prolonged course of ceftazidime metronidazole and vancomycin for chronic osteomyelitis. Home medication reconciliation: None Daily labs: CBC BMP Code Status: Full code Family Communication: Family updated by hospitalist 6/19 Disposition: ICU.  The patient is critically  ill with multiple organ systems failure and requires high complexity decision making for assessment and support, frequent evaluation and titration of therapies, application of advanced monitoring technologies and extensive interpretation of multiple databases. Critical Care Time devoted to patient care services described in this note independent of APP/resident time (if applicable)  is 30 minutes.   Sherrilyn Rist MD [facility] Personal pager: [phone] If unanswered, please page CCM On-call: [phone]

## 2020-03-21 ENCOUNTER — Inpatient Hospital Stay (HOSPITAL_COMMUNITY): Payer: Medicare HMO

## 2020-03-21 LAB — CBC WITH DIFFERENTIAL/PLATELET
Abs Immature Granulocytes: 0.21 10*3/uL — ABNORMAL HIGH (ref 0.00–0.07)
Basophils Absolute: 0.1 10*3/uL (ref 0.0–0.1)
Basophils Relative: 0 %
Eosinophils Absolute: 0.7 10*3/uL — ABNORMAL HIGH (ref 0.0–0.5)
Eosinophils Relative: 5 %
HCT: 29.1 % — ABNORMAL LOW (ref 39.0–52.0)
Hemoglobin: 9 g/dL — ABNORMAL LOW (ref 13.0–17.0)
Immature Granulocytes: 2 %
Lymphocytes Relative: 14 %
Lymphs Abs: 2 10*3/uL (ref 0.7–4.0)
MCH: 25.9 pg — ABNORMAL LOW (ref 26.0–34.0)
MCHC: 30.9 g/dL (ref 30.0–36.0)
MCV: 83.9 fL (ref 80.0–100.0)
Monocytes Absolute: 1.6 10*3/uL — ABNORMAL HIGH (ref 0.1–1.0)
Monocytes Relative: 12 %
Neutro Abs: 9.1 10*3/uL — ABNORMAL HIGH (ref 1.7–7.7)
Neutrophils Relative %: 67 %
Platelets: 207 10*3/uL (ref 150–400)
RBC: 3.47 MIL/uL — ABNORMAL LOW (ref 4.22–5.81)
RDW: 18.6 % — ABNORMAL HIGH (ref 11.5–15.5)
WBC: 13.6 10*3/uL — ABNORMAL HIGH (ref 4.0–10.5)
nRBC: 0 % (ref 0.0–0.2)

## 2020-03-21 LAB — GLUCOSE, CAPILLARY
Glucose-Capillary: 108 mg/dL — ABNORMAL HIGH (ref 70–99)
Glucose-Capillary: 110 mg/dL — ABNORMAL HIGH (ref 70–99)
Glucose-Capillary: 114 mg/dL — ABNORMAL HIGH (ref 70–99)
Glucose-Capillary: 115 mg/dL — ABNORMAL HIGH (ref 70–99)
Glucose-Capillary: 121 mg/dL — ABNORMAL HIGH (ref 70–99)
Glucose-Capillary: 121 mg/dL — ABNORMAL HIGH (ref 70–99)
Glucose-Capillary: 128 mg/dL — ABNORMAL HIGH (ref 70–99)

## 2020-03-21 LAB — RENAL FUNCTION PANEL
Albumin: 1.9 g/dL — ABNORMAL LOW (ref 3.5–5.0)
Anion gap: 10 (ref 5–15)
BUN: 62 mg/dL — ABNORMAL HIGH (ref 8–23)
CO2: 25 mmol/L (ref 22–32)
Calcium: 8.9 mg/dL (ref 8.9–10.3)
Chloride: 100 mmol/L (ref 98–111)
Creatinine, Ser: 2.09 mg/dL — ABNORMAL HIGH (ref 0.61–1.24)
GFR calc Af Amer: 36 mL/min — ABNORMAL LOW (ref >60)
GFR calc non Af Amer: 31 mL/min — ABNORMAL LOW (ref >60)
Glucose, Bld: 121 mg/dL — ABNORMAL HIGH (ref 70–99)
Phosphorus: 2 mg/dL — ABNORMAL LOW (ref 2.5–4.6)
Potassium: 3.3 mmol/L — ABNORMAL LOW (ref 3.5–5.1)
Sodium: 135 mmol/L (ref 135–145)

## 2020-03-21 MED ORDER — CHLORHEXIDINE GLUCONATE CLOTH 2 % EX PADS
6.0000 | MEDICATED_PAD | Freq: Every day | CUTANEOUS | Status: DC
  Administered 2020-03-22: 6 via TOPICAL

## 2020-03-21 NOTE — Progress Notes (Signed)
[facility] Progress Note  Subjective: no pressors but low BPs-  Rested on vent overnight but On trach collar this AM-  S/p dialysis yesterday-  Ran even essentially   Vitals:   03/21/20 0600 03/21/20 0700 03/21/20 0754 03/21/20 0802  BP: (!) 104/51 117/71    Pulse: 88 82 89   Resp: (!) 30 18 (!) 31   Temp:    99.8 F (37.7 C)  TempSrc:    Axillary  SpO2: 100% 100% 100%   Weight:      Height:        Exam:   In ICU on vent-  Looks at you-  Only shaking head no today   no jvd  Chest coarse BS bilat  Cor reg no RG  Abd soft ntnd no ascites, +llq ostomy, +peg tube   Ext mild pitting  LE edema   Nonfocal, +gen'd weakness        Summary: came from [facility] to [facility] after COVID hospitalization which included mech ventilation and trach placement. At [facility] had severe infections/ bacteremia/ PNA and developed AKI felt due to IV gent toxicity requiring initiation of HD 12/16/19. Admitted to ICU [facility] for trach bleed on 5/19. Was getting MWF HD at [facility] prior to admit here.   OP HD: started March 2021 at [facility] as above MWF   Getting HD here 3.5- 4h, UF 0.5- 1.5 L, hep 2000 prn   admit 62.8kg > range 56.9- 64.7kg here    Assessment/ Plan: 1. 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 Was on the floor for a while, went back to ICU and back on vent this past week.  Full code now.  Is on 40% FiO2. CXR bilat infiltrates, continues on broad-spec IV abx. Concern is for ongoing aspiration- now back on TC some 2. Enterococcus/ MRSE bacteremia HD cath sepsis - TDC removed 5/26, per ID getting 6 wks vanc/ fortaz/ flagyl for polymicrobial cath sepsis and sacral osteomyelitis.  3. Sacral osteo/ decub stage IV - as per #2 4. ESRD - on HD since mid March 2021, d/t gent toxicity. HD MWF at [facility]. S/P new TDC 6/2 by IR. Next HD tomorrow to keep MWF.  5. BP/vol - no sig vol excess now by exam, wt's stable around 60-63kg, unable to  UF on HD this past week.  Now with some dep pitting edema-  Will try some UF tomorrow  6. Hypokalemia-  Ran on high K bath yesterday-  Will order daily labs and treat as needed    7. AMS - variable, seems overall improved 8. Atrial fib - per primary, on eliquis 2.5 BID 9. Anemia ckd - transfuse prn, Hb remains 9- 10 range. started ESA 10. Hypercalcemia:  w/ low PTH, prob from immobility, corrCa 11.0 > 10.5>10.3, improved. Cont low 2.0 Ca++ bath , PTH 29. If recurs will consider pamidronate or similar. 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 SP COVID pna - original problem in Feb at OSH 13. DNR/ poor prognosis-  LTACH only option for disposition given needs but apparently not a candidate. Family asked not to be contacted by PCT anymore. PCT have now signed off.  For now cont plan of care.    Louis Meckel  03/21/2020, 8:29 AM   Recent Labs  Lab 03/18/20 0428 03/19/20 0201 03/20/20 0344 03/21/20 0746  K   < >  --  3.5 3.3*  BUN   < >  --  98* 62*  CREATININE   < >  --  2.92* 2.09*  CALCIUM   < >  --  9.4 8.9  PHOS   < >  --  2.6 2.0*  HGB  --  8.2* 8.8*  --    < > = values in this interval not displayed.   Inpatient medications:  apixaban  2.5 mg Per Tube BID   chlorhexidine gluconate (MEDLINE KIT)  15 mL Mouth Rinse BID   Chlorhexidine Gluconate Cloth  6 each Topical Q0600   Chlorhexidine Gluconate Cloth  6 each Topical Q0600   Chlorhexidine Gluconate Cloth  6 each Topical Q0600   Chlorhexidine Gluconate Cloth  6 each Topical Q0600   darbepoetin (ARANESP) injection - DIALYSIS  100 mcg Intravenous Q Mon-HD   feeding supplement (NEPRO CARB STEADY)  1,000 mL Per Tube Q24H   feeding supplement (PRO-STAT SUGAR FREE 64)  30 mL Per Tube TID   insulin aspart  0-6 Units Subcutaneous Q4H   insulin aspart  2 Units Subcutaneous Q4H   levothyroxine  25 mcg Per Tube Q0600   mouth rinse  15 mL Mouth Rinse 10 times per day   metoprolol tartrate  25 mg Per Tube BID    metroNIDAZOLE  500 mg Per Tube Q8H   nutrition supplement (JUVEN)  1 packet Per Tube BID BM   pantoprazole sodium  40 mg Per Tube Daily   phenylephrine 200 mcg / ml (NON-ED USE ONLY) injection  100 mcg Intracavernosal Once   vancomycin  500 mg Intravenous Q M,W,F-HD    sodium chloride Stopped (03/09/20 1302)   meropenem (MERREM) IV 500 mg (03/20/20 1735)   Followed by   Derrill Memo ON 03/25/2020] cefTAZidime (FORTAZ)  IV     sodium chloride, acetaminophen (TYLENOL) oral liquid 160 mg/5 mL, docusate, fentaNYL (SUBLIMAZE) injection, heparin, heparin, ipratropium-albuterol, metoprolol tartrate, polyethylene glycol

## 2020-03-21 NOTE — Progress Notes (Signed)
NAME:  Shane Banks, MRN:  [id number], DOB:  [birth year], LOS: 32 ADMISSION DATE:  02/14/2020, CONSULTATION DATE:  5/19 REFERRING MD:  Dr. Alvino Chapel, CHIEF COMPLAINT:  Hemoptysis   Brief History   74 yo m with PMHx of afib with RVR, BPH, stroke, and COVID ARDS in January requiring trach and LTACH placement at [facility]. Had been tolerating trach collar, but on 5/19 developed hemoptysis and hypoxemic respiratory failure requiring vent. Transferred to [facility] on 5/19.  Past Medical History   has a past medical history of Acute on chronic respiratory failure with hypoxia ([place]), Atrial fibrillation with RVR ([place]), BPH (benign prostatic hyperplasia), COVID-19 virus infection, Pneumonia due to COVID-19 virus, Severe sepsis ([place]), and Stroke ([place]).  Significant Hospital Events   1/8  Admit to [facility] for COVID PNA 2/23 Transfer to [facility] on vent 5/19 Transfer to [facility] for hemoptysis. #6 cuffed trach placed. Back on vent. Bronch performed 5/20 Transitioned to trach collar.  5/22 back on vent full time 5/24 back on antibiotics for fevers 6/1 Initiating PSV weans 6/3 Large cuff leak, trach exchange 6/7 transitioned to [facility] 6/9 remains on TC continuously since 6/7 6/23-tolerating trach collar today  Consults:  ENT Nephrology ID [id number]  Procedures:  ENT flex scope 5/19 > no bleeding source identified FOB 5/19 > no pulmonary bleeding source identified.   Significant Diagnostic Tests:  CXR 5/28 >> R CVC placement confirmed Slightly improved aeration at the right base. 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 Edema in the muscles around the hips and in the posterior paraspinal musculature of the lower lumbar spine and in the buttocks which could represent myositis. Fluid-fluid level in the bladder  may represent protein or debris in the bladder. The possibility of urinary tract infection should be considered.  CXR 6/3> improved R lung aeration, worsening L sided opacity. Tracheostomy tube, HD catheter.   CXR 6/4>wosened R lung infiltrates and stable L sided infiltrate. Possible bilateral pleural effusions   Micro Data:    tracheal aspirate 5/7> for Pseudomonas A  BAL 5/19 > no growth 5/19 blood > NG 5/22 trach aspirate>> candida [place] 5/22 blood>> 1/4 GPC> staph epi 5/25 blood>> GPC (enterococcus on biofire) 2/4 bottles>> E. Faecalis, staph epi. 5/25 trach aspirate>>many PMN, few GPR, rare GPC> diphtheroids 5/25 blood cx>> + for Enterococcus Faecalis (gent resistant)/ Staphylococcus Epidermidis (tetracycline, vanc, rifampin sensitive) 5/27 BCx> ngtd    Antimicrobials:  Levofloxacin 5/24>5/26 vanc 5/25> Ceftriaxone  5/27>6/1 Metronidazole 5/27>  [name] 6/1> Meropenem 6/21  To continue antibiotics up until 04/04/2020  Interim history/subjective:  Returned to ICU 6/19 for increased secretions requiring mechanical ventilation No overnight events, currently on ATM this morning appears to be tolerating it okay Requiring suctioning  Tolerating trach collar  Objective   Blood pressure 112/61, pulse 88, temperature 99.8 F (37.7 C), temperature source Axillary, resp. rate (!) 34, height _0  (1.753 m), weight 63.2 kg, SpO2 100 %.    Vent Mode: PRVC FiO2 (%):  [30 %-40 %] 40 % Set Rate:  [15 bmp] 15 bmp Vt Set:  [560 mL] 560 mL PEEP:  [5 cmH20] 5 cmH20 Plateau Pressure:  [30 cmH20-35 cmH20] 30 cmH20   Intake/Output Summary (Last 24 hours) at 03/21/2020 0931 Last data filed at 03/21/2020 0900 Gross per 24 hour  Intake 1020 ml  Output 390 ml  Net  630 ml   Filed Weights   03/20/20 0418 03/20/20 1204 03/21/20 0454  Weight: 62.9 kg 62.9 kg 63.2 kg    Examination:   Chronically ill-appearing Tolerating trach collar Tracheostomy with tan secretions Chest: R  clear breath sounds anteriorly CVS: S1-S2 appreciated Nondistended, PEG in place ostomy in place positive bowel sounds 1+ lower extremity edema Answering questions, interactive  Chest x-ray 6/24 reviewed-stable findings reviewed by myself Resolved Hospital Problem list     Assessment & Plan:   Acute on chronic hypoxemic respiratory failure requiring tracheostomy, prolonged MV COVID fibrosis,  Deconditioning multiple HCAPs. 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 -On antibiotics -Appreciate infectious disease and pharmacy input  End-stage renal disease on hemodialysis. -Continue hemodialysis    Limited options for further intervention.  Family has been resistant to placing any limitations in care.  No significant progress at present   Daily Goals Checklist  Pain/Anxiety/Delirium protocol (if indicated): As needed only VAP protocol (if indicated): Bundle in place Respiratory support goals: Daily SBT/trach collar trial Blood pressure target: Keep MAP greater than 65 titrate low-dose norepinephrine DVT prophylaxis: Apixaban Nutrition Status: High nutritional risk.  On tube feeds. GI prophylaxis: Pantoprazole Fluid status goals: Likely volume contracted.  Gentle hydration. Urinary catheter: Condom catheter Central lines: Chronic HD catheter. Glucose control: Currently euvolemic  Mobility/therapy needs: Bedbound at baseline Antibiotic de-escalation: On prolonged course of meropenem, metronidazole and vancomycin for chronic  osteomyelitis. Home medication reconciliation: None Daily labs: CBC BMP Code Status: Full code Family Communication: Family updated by hospitalist 6/19 Disposition: ICU.  The patient is critically ill with multiple organ systems failure and requires high complexity decision making for assessment and support, frequent evaluation and titration of therapies, application of advanced monitoring technologies and extensive interpretation of multiple databases. Critical Care Time devoted to patient care services described in this note independent of APP/resident time (if applicable)  is 33 minutes.   Sherrilyn Rist MD [place] [facility] Personal pager: [phone] If unanswered, please page CCM On-call: [phone]

## 2020-03-21 NOTE — Progress Notes (Signed)
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 Patient had recent history of Covid pneumonia and was on ventilator because he progressed to ARDS. Because of little improvement in the ventilatory and oxygenation, he underwent tracheostomy and was discharged to [facility]. He had recent history of UTI, multidrug-resistant Pseudomonas pneumonia, stenotrophomonas bacteremia. Because of sepsis and shock, he developed renal failure requiring dialysis. On 5/19, he developed hemoptysis and was transferred to [facility] ED for evaluation. PCCM,ID,Nephrology following here. 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 He also underwent tracheostomy tube change by PCCM for persistent leak. Due to lack of insurance, he cannot go back to [facility]. Social worker assisting on placement.   1/8 Admit to [facility] for COVID PNA 2/23 Transfer to [facility] on vent 5/19 Transfer to [facility] for hemoptysis. #6 cuffed trach placed. Back on vent. Bronch performed 5/20 Transitioned to trach collar.  5/22 back on vent full time 5/24 back on antibiotics for fevers 6/1 Initiating PSV weans 6/3 Large cuff leak, trach exchange 6/7 transitioned to [facility] 6/9 remains on TC continuously since 6/7 6/18/: Pt returned to ICU for mechanical ventilation and pressors. Pt with sharply decreased saturations while in HD. Patient was requiring pressor support in ICU initially. He is now off of them, but continues to require vent support.  6/24: Able to go back to trach collar   No events overnight.  Had some episodes of hypotension today with dialysis.  Now back on trach  collar.  Remains nonverbal and does not follow commands.  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  Status post trach due to respiratory failure from Covid pneumonia.He ison trach collar.PCCM following. Most recently pneumonia has been multidrug-resistant Pseudomonas for which she was treated with Avycaz and mycin nebulizers at [facility]. Possible ongoing aspiration also. Presented with hemoptysis, aspiration of blood. Patient has cuffed trach placed by ENT.No active bleeding on bronchoscopy. He also underwent tracheostomy tube change by PCCM for persistent leak on 03/01/20. Chest x-ray done on 03/03/2020 showed bilateral pleural effusions, stable extensive opacity throughout the left hemithorax. Currently he is on trach collar, requiring 8 L of oxygen per minute. He continues to receive duonebs and CPT. Volume control per HD. The patient was been taken back to HD on the afternoon of 03/15/2020. He was returned to the floor in extremis with saturations in the 70's on NRB. Rapid Response called. Family rescinded DNR and stated that they (Mother, Sister, and brother) that they wished for him to be ventilated.    HCAP: Pt with purulent secretions from trach. WBC is elevated as is procalcitonin. Lactic acid is negative. The patient is receiving Vanc, Flagyl, and Merrem.  Sepsis: Hypotension, tachycardia, elevated WBC. Assumed to be due to HCAP with worsening Lt>Rt infiltrates  on CXR. The patient is receiving  Vanc, Flagyl, and Merrem.  CVA: Last night on physical exam the patient appeared to have suffed a catastrophic CVA. Eyes were sharply deviated to the right, and he was unresponsive. Initial CT head was negative for new stroke, but did demonstrate an age indeterminate basal ganglia stroke.   Paroxysmal A. SFK:CLEXNTZGY rate is controlled. Anticoagulation was discontinued due to suspected GI bleed. He is currently on low-dose Eliquis. Added low dose toprol xl 12.5 mg qd for improved rate control.  Heart rate in the low 90s  Sepsis/bacteremia:Enterococcus and Staphylococcus epidermidis bacteremia HD cath sepsis: TDC removed 5/26. Also has osteomyelitis fifth sacral segment. There is high suspicion for ongoing aspiration. MRI pelvis/sacrum showed osteomyelitis of fifth sacral segment, edema in the muscle around both hips and proximal thighs with possible myositis. ID had recommended treatment for 6 weeks for sacral osteomyelitis and staph epidermidis bacteremia with vancomycin, fortaz and metronidazole. IR consulted for bone biopsy but due to high risk it was not done. He has persistent leukocytosis. Continue to monitor CBC.  Will repeat blood cultrues. Respiratory cultures from 03/16/2020 have had no growth. Blood cultures x 2 have been repeated.   ESRD:on HD since mid March 2021. AKI is due to gent toxicity. HD MWF at [facility]. S/P new Tunnel catheter placement on 6/2 by IR.  Watching closely, monitoring for episodes of hypotension  Chronic normocytic anemia: Secondary to ESRD, critical illness. Has been transfused with PRBCs. Currently hemoglobin stable. Continue to monitor.   Unstageable sacral pressure ulcer/osteomyelitis:Wound care following.  See below.  Cachexia/severe protein malnutrition:On tube feeds. Nutrition following.  Mild elevated LFTs:Continue to monitor.  Severe Protein Calorie Malnutrition/underweight: Meets criteria as evidenced by severe muscle and fat  depletion.  Due to chronic illness.  Seen by nutrition.  On Nepro and Protostat 3 times daily.  Pressure Injury 02/14/20 Sacrum Medial Stage 4 - Full thickness tissue loss with exposed bone, tendon or muscle. (Active)  02/14/20 1906  Location: Sacrum  Location Orientation: Medial  Staging: Stage 4 - Full thickness tissue loss with exposed bone, tendon or muscle.  Wound Description (Comments):   Present on Admission: Yes    Pressure Injury 03/03/20 Neck Anterior Stage 3 - Full thickness tissue loss. Subcutaneous fat may be visible but bone, tendon or muscle are NOT exposed. Device related injury under trach tube. (Active)  03/03/20 1805  Location: Neck  Location Orientation: Anterior  Staging: Stage 3 - Full thickness tissue loss. Subcutaneous fat may be visible but bone, tendon or muscle are NOT exposed.  Wound Description (Comments): Device related injury under trach tube.   Present on Admission: No     Code Status: Full code  Family Communication: Left message for sister  Disposition Plan: Looking for long-term acute care facility   Consultants:  Palliative care  Wound care  Interventional Radiology  Infectious Disease  Nephrology  Neurosurgery  ENT  PCCM   Procedures:  Mechanical Ventilation  Tracheostomy  Trach exchange  Transition to trach collar  Flexible laryngeal endoscopy - no source of bleeding seen.  Bronchoscopy  Tunnelled catheter placement by IR  HD  Antimicrobials:  IV ceftaz 6/1-7/9  IV vancomycin Monday Wednesday Friday with dialysis 5/19 -present  IV meropenem 6/21-6/28  IV Flagyl 5/27-7/8  IV Rocephin 5/27-6/1  IV Levaquin 5/25-5/26  IV cefepime 5/19 only  DVT prophylaxis: Eliquis   Objective: Vitals:   03/21/20 1600 03/21/20 1700  BP: 128/72 110/74  Pulse: 86 92  Resp: (!) 36 (!)  26  Temp:    SpO2: 100% 100%    Intake/Output Summary (Last 24 hours) at 03/21/2020 1735 Last data filed at 03/21/2020  1700 Gross per 24 hour  Intake 940 ml  Output 457 ml  Net 483 ml   Filed Weights   03/20/20 0418 03/20/20 1204 03/21/20 0454  Weight: 62.9 kg 62.9 kg 63.2 kg   Body mass index is 20.58 kg/m.  Exam: Unchanged from previous day  General: Remains not really responsive, does not follow commands  Cardiovascular: Regular rhythm, rate controlled  Respiratory: Mostly clear, poor inspiratory effort-although bit more tachypneic today  Abdomen: Soft, nontender, nondistended, hypoactive bowel sounds  Musculoskeletal: No clubbing or cyanosis, trace pitting edema  Psychiatry: Minimally responsive, no evidence of acute psychosis   Data Reviewed: CBC: Recent Labs  Lab 03/17/20 0410 03/18/20 0428 03/19/20 0201 03/20/20 0344 03/21/20 0746  WBC 17.9* 14.7* 13.9* 14.2* 13.6*  NEUTROABS 12.9* 10.9* 9.5* 9.2* 9.1*  HGB 8.1* 8.1* 8.2* 8.8* 9.0*  HCT 25.8* 25.7* 25.8* 28.2* 29.1*  MCV 82.4 82.1 81.1 82.0 83.9  PLT 206 191 192 207 161   Basic Metabolic Panel: Recent Labs  Lab 03/15/20 0304 03/15/20 2204 03/18/20 0428 03/18/20 2108 03/20/20 0344 03/21/20 0746  NA 135 138 134*  --  137 135  K 3.7 3.5 3.1*  --  3.5 3.3*  CL 97*  --  98  --  100 100  CO2 28  --  24  --  25 25  GLUCOSE 178*  --  193*  --  109* 121*  BUN 57*  --  112*  --  98* 62*  CREATININE 2.23*  --  3.78*  --  2.92* 2.09*  CALCIUM 9.0  --  9.1  --  9.4 8.9  MG 2.2  --   --  1.7  --   --   PHOS  --   --  3.9  --  2.6 2.0*   GFR: Estimated Creatinine Clearance: 28.6 mL/min (A) (by C-G formula based on SCr of 2.09 mg/dL (H)). Liver Function Tests: Recent Labs  Lab 03/18/20 0428 03/20/20 0344 03/21/20 0746  ALBUMIN 1.7* 1.8* 1.9*   No results for input(s): LIPASE, AMYLASE in the last 168 hours. No results for input(s): AMMONIA in the last 168 hours. Coagulation Profile: No results for input(s): INR, PROTIME in the last 168 hours. Cardiac Enzymes: No results for input(s): CKTOTAL, CKMB, CKMBINDEX,  TROPONINI in the last 168 hours. BNP (last 3 results) No results for input(s): PROBNP in the last 8760 hours. HbA1C: No results for input(s): HGBA1C in the last 72 hours. CBG: Recent Labs  Lab 03/21/20 0045 03/21/20 0453 03/21/20 0739 03/21/20 1130 03/21/20 1524  GLUCAP 114* 110* 115* 128* 121*   Lipid Profile: No results for input(s): CHOL, HDL, LDLCALC, TRIG, CHOLHDL, LDLDIRECT in the last 72 hours. Thyroid Function Tests: No results for input(s): TSH, T4TOTAL, FREET4, T3FREE, THYROIDAB in the last 72 hours. Anemia Panel: No results for input(s): VITAMINB12, FOLATE, FERRITIN, TIBC, IRON, RETICCTPCT in the last 72 hours. Urine analysis:    Component Value Date/Time   COLORURINE YELLOW 12/11/2019 1230   APPEARANCEUR HAZY (A) 12/11/2019 1230   LABSPEC 1.018 12/11/2019 1230   PHURINE 6.0 12/11/2019 1230   GLUCOSEU NEGATIVE 12/11/2019 1230   HGBUR NEGATIVE 12/11/2019 1230   BILIRUBINUR NEGATIVE 12/11/2019 1230   KETONESUR NEGATIVE 12/11/2019 1230   PROTEINUR 30 (A) 12/11/2019 1230   NITRITE NEGATIVE 12/11/2019 1230   LEUKOCYTESUR LARGE (A) 12/11/2019 1230  Sepsis Labs: _0 (procalcitonin:4,lacticidven:4)  ) Recent Results (from the past 240 hour(s))  Culture, respiratory (non-expectorated)     Status: None   Collection Time: 03/16/20  6:02 AM   Specimen: Tracheal Aspirate; Respiratory  Result Value Ref Range Status   Specimen Description TRACHEAL ASPIRATE  Final   Special Requests NONE  Final   Gram Stain   Final    RARE WBC PRESENT, PREDOMINANTLY PMN NO ORGANISMS SEEN Performed at [facility], [place] [address] [place], [place] [address]    Culture MODERATE PSEUDOMONAS AERUGINOSA  Final   Report Status 03/18/2020 FINAL  Final   Organism ID, Bacteria PSEUDOMONAS AERUGINOSA  Final      Susceptibility   Pseudomonas aeruginosa - MIC*    CEFTAZIDIME >=64 RESISTANT Resistant     CIPROFLOXACIN >=4 RESISTANT Resistant     GENTAMICIN 8 INTERMEDIATE  Intermediate     IMIPENEM 2 SENSITIVE Sensitive     CEFEPIME >=32 RESISTANT Resistant     * MODERATE PSEUDOMONAS AERUGINOSA  Culture, blood (routine x 2)     Status: None (Preliminary result)   Collection Time: 03/17/20  8:32 PM   Specimen: BLOOD RIGHT HAND  Result Value Ref Range Status   Specimen Description BLOOD RIGHT HAND  Final   Special Requests   Final    BOTTLES DRAWN AEROBIC ONLY Blood Culture adequate volume   Culture   Final    NO GROWTH 4 DAYS Performed at [facility], [address] [place], [place] [address]    Report Status PENDING  Incomplete  Culture, blood (routine x 2)     Status: None (Preliminary result)   Collection Time: 03/17/20  8:39 PM   Specimen: BLOOD LEFT HAND  Result Value Ref Range Status   Specimen Description BLOOD LEFT HAND  Final   Special Requests   Final    BOTTLES DRAWN AEROBIC ONLY Blood Culture adequate volume   Culture   Final    NO GROWTH 4 DAYS Performed at [facility], [place] [address] [place], [place] [address]    Report Status PENDING  Incomplete      Studies: DG Chest Port 1 View  Result Date: 03/21/2020 CLINICAL DATA:  Respiratory failure EXAM: PORTABLE CHEST 1 VIEW COMPARISON:  03/15/2020 FINDINGS: Tracheostomy tube terminates in the mid trachea, 4.1 cm from the carina. Dual lumen right IJ approach central venous catheter tip terminates at the level of the right atrium. Telemetry leads and additional support devices overlie the chest. Heterogeneous opacity throughout the left mid to lower lung is grossly unchanged from comparison imaging. There is some more fine reticular opacities in the right mid to lower lung as well. A trace right and small left pleural effusion are not significantly changed in volume accounting for differences in technique. Stable cardiomediastinal contours with a calcified aorta. No acute osseous or soft tissue abnormality. IMPRESSION: 1. No significant interval change in left mid to lower lung  airspace disease. Milder interstitial opacities in the right lower lung are stable as well. 2. Trace right and small left pleural effusions are not significantly changed in volume. Electronically Signed   By: Lovena Le M.D.   On: 03/21/2020 06:40    Scheduled Meds: . apixaban  2.5 mg Per Tube BID  . chlorhexidine gluconate (MEDLINE KIT)  15 mL Mouth Rinse BID  . Chlorhexidine Gluconate Cloth  6 each Topical Q0600  . Chlorhexidine Gluconate Cloth  6 each Topical Q0600  . Chlorhexidine Gluconate Cloth  6 each Topical Q0600  . Chlorhexidine Gluconate Cloth  6 each Topical Q0600  . Chlorhexidine Gluconate Cloth  6 each Topical Q0600  . darbepoetin (ARANESP) injection - DIALYSIS  100 mcg Intravenous Q Mon-HD  . feeding supplement (NEPRO CARB STEADY)  1,000 mL Per Tube Q24H  . feeding supplement (PRO-STAT SUGAR FREE 64)  30 mL Per Tube TID  . insulin aspart  0-6 Units Subcutaneous Q4H  . insulin aspart  2 Units Subcutaneous Q4H  . levothyroxine  25 mcg Per Tube Q0600  . mouth rinse  15 mL Mouth Rinse 10 times per day  . metoprolol tartrate  25 mg Per Tube BID  . metroNIDAZOLE  500 mg Per Tube Q8H  . nutrition supplement (JUVEN)  1 packet Per Tube BID BM  . pantoprazole sodium  40 mg Per Tube Daily  . phenylephrine 200 mcg / ml (NON-ED USE ONLY) injection  100 mcg Intracavernosal Once  . vancomycin  500 mg Intravenous Q M,W,F-HD    Continuous Infusions: . sodium chloride Stopped (03/09/20 1302)  . meropenem (MERREM) IV 500 mg (03/21/20 1720)   Followed by  . [START ON 03/25/2020] cefTAZidime (FORTAZ)  IV       LOS: 36 days     Annita Brod, MD [facility]   03/21/2020, 5:35 PM

## 2020-03-22 LAB — RENAL FUNCTION PANEL
Albumin: 1.9 g/dL — ABNORMAL LOW (ref 3.5–5.0)
Anion gap: 10 (ref 5–15)
BUN: 98 mg/dL — ABNORMAL HIGH (ref 8–23)
CO2: 26 mmol/L (ref 22–32)
Calcium: 9.4 mg/dL (ref 8.9–10.3)
Chloride: 101 mmol/L (ref 98–111)
Creatinine, Ser: 2.7 mg/dL — ABNORMAL HIGH (ref 0.61–1.24)
GFR calc Af Amer: 26 mL/min — ABNORMAL LOW (ref >60)
GFR calc non Af Amer: 23 mL/min — ABNORMAL LOW (ref >60)
Glucose, Bld: 100 mg/dL — ABNORMAL HIGH (ref 70–99)
Phosphorus: 3 mg/dL (ref 2.5–4.6)
Potassium: 3.4 mmol/L — ABNORMAL LOW (ref 3.5–5.1)
Sodium: 137 mmol/L (ref 135–145)

## 2020-03-22 LAB — CBC WITH DIFFERENTIAL/PLATELET
Abs Immature Granulocytes: 0.15 10*3/uL — ABNORMAL HIGH (ref 0.00–0.07)
Basophils Absolute: 0.1 10*3/uL (ref 0.0–0.1)
Basophils Relative: 1 %
Eosinophils Absolute: 0.8 10*3/uL — ABNORMAL HIGH (ref 0.0–0.5)
Eosinophils Relative: 6 %
HCT: 30.2 % — ABNORMAL LOW (ref 39.0–52.0)
Hemoglobin: 9.3 g/dL — ABNORMAL LOW (ref 13.0–17.0)
Immature Granulocytes: 1 %
Lymphocytes Relative: 16 %
Lymphs Abs: 2.1 10*3/uL (ref 0.7–4.0)
MCH: 25.5 pg — ABNORMAL LOW (ref 26.0–34.0)
MCHC: 30.8 g/dL (ref 30.0–36.0)
MCV: 82.7 fL (ref 80.0–100.0)
Monocytes Absolute: 1.5 10*3/uL — ABNORMAL HIGH (ref 0.1–1.0)
Monocytes Relative: 12 %
Neutro Abs: 8.4 10*3/uL — ABNORMAL HIGH (ref 1.7–7.7)
Neutrophils Relative %: 64 %
Platelets: 249 10*3/uL (ref 150–400)
RBC: 3.65 MIL/uL — ABNORMAL LOW (ref 4.22–5.81)
RDW: 18.4 % — ABNORMAL HIGH (ref 11.5–15.5)
WBC: 13.1 10*3/uL — ABNORMAL HIGH (ref 4.0–10.5)
nRBC: 0 % (ref 0.0–0.2)

## 2020-03-22 LAB — GLUCOSE, CAPILLARY
Glucose-Capillary: 106 mg/dL — ABNORMAL HIGH (ref 70–99)
Glucose-Capillary: 111 mg/dL — ABNORMAL HIGH (ref 70–99)
Glucose-Capillary: 132 mg/dL — ABNORMAL HIGH (ref 70–99)
Glucose-Capillary: 135 mg/dL — ABNORMAL HIGH (ref 70–99)
Glucose-Capillary: 148 mg/dL — ABNORMAL HIGH (ref 70–99)

## 2020-03-22 LAB — CULTURE, BLOOD (ROUTINE X 2)
Culture: NO GROWTH
Culture: NO GROWTH
Special Requests: ADEQUATE
Special Requests: ADEQUATE

## 2020-03-22 LAB — VANCOMYCIN, RANDOM: Vancomycin Rm: 15

## 2020-03-22 MED ORDER — VANCOMYCIN HCL IN DEXTROSE 500-5 MG/100ML-% IV SOLN
INTRAVENOUS | Status: AC
  Administered 2020-03-22: 500 mg via INTRAVENOUS
  Filled 2020-03-22: qty 100

## 2020-03-22 MED ORDER — ALBUMIN HUMAN 25 % IV SOLN
INTRAVENOUS | Status: AC
  Administered 2020-03-22: 25 g
  Filled 2020-03-22: qty 100

## 2020-03-22 MED ORDER — HEPARIN SODIUM (PORCINE) 1000 UNIT/ML IJ SOLN
INTRAMUSCULAR | Status: AC
  Administered 2020-03-22: 3800 [IU] via INTRAVENOUS_CENTRAL
  Filled 2020-03-22: qty 4

## 2020-03-22 MED ORDER — VANCOMYCIN HCL 500 MG/100ML IV SOLN
500.0000 mg | Freq: Once | INTRAVENOUS | Status: AC
  Administered 2020-03-22: 500 mg via INTRAVENOUS
  Filled 2020-03-22: qty 100

## 2020-03-22 NOTE — Progress Notes (Signed)
PROGRESS NOTE  Shane Banks PPI:[id number] DOB: Feb 20, 1946 DOA: 02/14/2020 PCP: [name], MD  Brief History   Patient is a 74 year old male with past medical history of chronic hypoxic respiratory failure status post tracheostomy, ESRD on dialysis, atrial fibrillation, nonhemorrhagic stroke who was admitted from [place] for the evaluation of trach site bleeding. Patient had recent history of Covid pneumonia and was on ventilator because he progressed to ARDS. Because of little improvement in the ventilatory and oxygenation, he underwent tracheostomy and was discharged to [facility]. He had recent history of UTI, multidrug-resistant Pseudomonas pneumonia, stenotrophomonas bacteremia. Because of sepsis and shock, he developed renal failure requiring dialysis. On 5/19, he developed hemoptysis and was transferred to [facility] ED for evaluation. PCCM,ID,Nephrology following here. 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 He also underwent tracheostomy tube change by PCCM for persistent leak. Due to lack of insurance, he cannot go back to [employer]. Social worker assisting on placement. Anticipate prolonged hospitalization.  1/8 Admit to [facility] for COVID PNA 2/23 Transfer to [facility] on vent 5/19 Transfer to [facility] for hemoptysis. #6 cuffed trach placed. Back on vent. Bronch performed 5/20 Transitioned to trach collar.  5/22 back on vent full time 5/24 back on antibiotics for fevers 6/1 Initiating PSV weans 6/3 Large cuff leak, trach exchange 6/7 transitioned to [facility] 6/9 remains on TC continuously since 6/7 03/15/2020: Pt returned to ICU for mechanical ventilation and pressors. Pt with sharply decreased saturations while in HD. Patient was requiring pressor support in ICU initially. He is now off of them, but continues to require vent support. Plan is to again wean him to trach collar. 03/21/2020: Pt taken off of vent.  03/22/2020: Pt transferred to  renal floor.  Consultants  . Palliative care . Wound care . Interventional Radiology . Infectious Disease . Nephrology . Neurosurgery . ENT . PCCM  Procedures  . Mechanical Ventilation . Tracheostomy . Trach exchange . Transition to trach collar . Flexible laryngeal endoscopy - no source of bleeding seen. . Bronchoscopy . Tunnelled catheter placement by IR . HD  Antibiotics   Anti-infectives (From admission, onward)   Start     Dose/Rate Route Frequency Ordered Stop   03/25/20 1800  cefTAZidime (FORTAZ) 1 g in sodium chloride 0.9 % 100 mL IVPB     Discontinue    "Followed by" Linked Group Details   1 g 200 mL/hr over 30 Minutes Intravenous Every 24 hours 03/18/20 1015 04/05/20 1759   03/22/20 0830  vancomycin (VANCOREADY) IVPB 500 mg/100 mL        500 mg 100 mL/hr over 60 Minutes Intravenous  Once 03/22/20 0827 03/22/20 1014   03/20/20 1200  vancomycin (VANCOCIN) IVPB 500 mg/100 ml premix     Discontinue     500 mg 100 mL/hr over 60 Minutes Intravenous Every M-W-F (Hemodialysis) 03/19/20 0941     03/18/20 1800  meropenem (MERREM) 500 mg in sodium chloride 0.9 % 100 mL IVPB     Discontinue    "Followed by" Linked Group Details   500 mg 200 mL/hr over 30 Minutes Intravenous Every 24 hours 03/18/20 1015 03/25/20 1759   03/18/20 1200  vancomycin (VANCOCIN) IVPB 500 mg/100 ml premix        500 mg 100 mL/hr over 60 Minutes Intravenous Every M-W-F (Hemodialysis) 03/18/20 0938 03/18/20 1700   03/18/20 1200  cefTAZidime (FORTAZ) 2 g in sodium chloride 0.9 % 100 mL IVPB  Status:  Discontinued  2 g 200 mL/hr over 30 Minutes Intravenous Every Mon (Hemodialysis) 03/18/20 0939 03/18/20 1015   03/07/20 1800  cefTAZidime (FORTAZ) 2 g in sodium chloride 0.9 % 100 mL IVPB  Status:  Discontinued        2 g 200 mL/hr over 30 Minutes Intravenous Every T-Th-Sa (1800) 03/06/20 1255 03/18/20 1015   03/07/20 1200  vancomycin (VANCOCIN) IVPB 500 mg/100 ml premix  Status:  Discontinued         500 mg 100 mL/hr over 60 Minutes Intravenous Every T-Th-Sa (Hemodialysis) 03/06/20 1255 03/19/20 0941   03/04/20 2000  cefTAZidime (FORTAZ) 2 g in sodium chloride 0.9 % 100 mL IVPB  Status:  Discontinued        2 g 200 mL/hr over 30 Minutes Intravenous Every M-W-F (2000) 03/01/20 1136 03/06/20 1255   03/02/20 1200  vancomycin (VANCOCIN) IVPB 500 mg/100 ml premix        500 mg 100 mL/hr over 60 Minutes Intravenous Every T-Th-Sa (Hemodialysis) 03/01/20 1131 03/02/20 1710   03/01/20 1230  vancomycin (VANCOCIN) IVPB 500 mg/100 ml premix  Status:  Discontinued        500 mg 100 mL/hr over 60 Minutes Intravenous Every M-W-F (Hemodialysis) 03/01/20 1131 03/06/20 1255   02/28/20 1110  vancomycin (VANCOREADY) IVPB 500 mg/100 mL        over 60 Minutes  Continuous PRN 02/28/20 1112 02/28/20 1110   02/28/20 1101  vancomycin (VANCOCIN) 1-5 GM/200ML-% IVPB  Status:  Discontinued       Note to Pharmacy: Lytle Butte   : cabinet override      02/28/20 1101 02/28/20 1327   02/27/20 1800  cefTAZidime (FORTAZ) 1 g in sodium chloride 0.9 % 100 mL IVPB        1 g 200 mL/hr over 30 Minutes Intravenous Every 24 hours 02/27/20 0947 03/03/20 1738   02/27/20 1200  vancomycin (VANCOCIN) IVPB 500 mg/100 ml premix        500 mg 100 mL/hr over 60 Minutes Intravenous Once 02/27/20 0829 02/27/20 1343   02/24/20 1800  vancomycin (VANCOREADY) IVPB 500 mg/100 mL        500 mg 100 mL/hr over 60 Minutes Intravenous  Once 02/24/20 1308 02/24/20 1905   02/22/20 2200  metroNIDAZOLE (FLAGYL) tablet 500 mg     Discontinue     500 mg Per Tube Every 8 hours 02/22/20 0957 04/04/20 2359   02/22/20 1700  cefTRIAXone (ROCEPHIN) 2 g in sodium chloride 0.9 % 100 mL IVPB  Status:  Discontinued        2 g 200 mL/hr over 30 Minutes Intravenous Every 24 hours 02/22/20 0957 02/27/20 0947   02/22/20 1200  vancomycin (VANCOCIN) IVPB 500 mg/100 ml premix  Status:  Discontinued        500 mg 100 mL/hr over 60 Minutes Intravenous Every  T-Th-Sa (Hemodialysis) 02/20/20 1253 02/21/20 1040   02/21/20 1431  vancomycin variable dose per unstable renal function (pharmacist dosing)  Status:  Discontinued         Does not apply See admin instructions 02/21/20 1431 03/01/20 1131   02/21/20 1200  vancomycin (VANCOREADY) IVPB 500 mg/100 mL        500 mg 100 mL/hr over 60 Minutes Intravenous  Once 02/21/20 0956 02/21/20 1253   02/21/20 0714  vancomycin (VANCOCIN) 500-5 MG/100ML-% IVPB  Status:  Discontinued       Note to Pharmacy: Ashley Akin   : cabinet override      02/21/20 0714 02/21/20  1346   02/20/20 1400  vancomycin (VANCOREADY) IVPB 1250 mg/250 mL        1,250 mg 166.7 mL/hr over 90 Minutes Intravenous  Once 02/20/20 1253 02/20/20 1538   02/20/20 1200  levofloxacin (LEVAQUIN) IVPB 500 mg  Status:  Discontinued        500 mg 100 mL/hr over 60 Minutes Intravenous Every 48 hours 02/20/20 1020 02/21/20 0943   02/15/20 1545  vancomycin (VANCOCIN) IVPB 1000 mg/200 mL premix  Status:  Discontinued       "Followed by" Linked Group Details   1,000 mg 200 mL/hr over 60 Minutes Intravenous Every 24 hours 02/14/20 1530 02/14/20 1745   02/15/20 0330  ceFEPIme (MAXIPIME) 2 g in sodium chloride 0.9 % 100 mL IVPB  Status:  Discontinued        2 g 200 mL/hr over 30 Minutes Intravenous Every 12 hours 02/14/20 1531 02/14/20 1531   02/14/20 1531  ceFEPIme (MAXIPIME) 2 g in sodium chloride 0.9 % 100 mL IVPB  Status:  Discontinued        2 g 200 mL/hr over 30 Minutes Intravenous Every 12 hours 02/14/20 1531 02/14/20 1745   02/14/20 1530  ceFEPIme (MAXIPIME) 2 g in sodium chloride 0.9 % 100 mL IVPB  Status:  Discontinued        2 g 200 mL/hr over 30 Minutes Intravenous  Once 02/14/20 1520 02/14/20 1531   02/14/20 1530  vancomycin (VANCOREADY) IVPB 1500 mg/300 mL  Status:  Discontinued       "Followed by" Linked Group Details   1,500 mg 150 mL/hr over 120 Minutes Intravenous  Once 02/14/20 1530 02/14/20 1745     .   Subjective  The  patient is resting comfortably. He is awake and alert.   Objective   Vitals:  Vitals:   03/22/20 1800 03/22/20 1857  BP: (!) 108/55   Pulse: 92 (!) 103  Resp: (!) 46 (!) 45  Temp:    SpO2: 92% 94%   Exam:  Constitutional:  . Patient is awake, nonverbal. No acute distress. Neck:  . neck appears normal, no masses, normal ROM, supple . no thyromegaly . Trach stable. Respiratory:  . No increased work of breathing. . No wheezes, rales, or rhonchi . No tactile fremitus . Coarse breath sounds bilaterally Cardiovascular:  . Regular rate and rhythm . No murmurs, ectopy, or gallups. . No lateral PMI. No thrills. Abdomen:  . Abdomen is soft, non-tender, non-distended . No hernias, masses, or organomegaly . Normoactive bowel sounds.  Musculoskeletal:  . No cyanosis, clubbing . Mild edema Skin:  . No rashes, lesions, ulcers . palpation of skin: no induration or nodules Neurologic:  . Patient is unable to cooperate with exam. Psychiatric:  Patient is unable to cooperate with exam.  I have personally reviewed the following:   Today's Data  . Vitals, CBC, BMP, Glucoses  Micro Data  . Blood cultures x 2 (03/03/2020) No growth  Scheduled Meds: . apixaban  2.5 mg Per Tube BID  . chlorhexidine gluconate (MEDLINE KIT)  15 mL Mouth Rinse BID  . Chlorhexidine Gluconate Cloth  6 each Topical Q0600  . Chlorhexidine Gluconate Cloth  6 each Topical Q0600  . Chlorhexidine Gluconate Cloth  6 each Topical Q0600  . Chlorhexidine Gluconate Cloth  6 each Topical Q0600  . Chlorhexidine Gluconate Cloth  6 each Topical Q0600  . darbepoetin (ARANESP) injection - DIALYSIS  100 mcg Intravenous Q Mon-HD  . feeding supplement (NEPRO CARB STEADY)  1,000  mL Per Tube Q24H  . feeding supplement (PRO-STAT SUGAR FREE 64)  30 mL Per Tube TID  . insulin aspart  0-6 Units Subcutaneous Q4H  . insulin aspart  2 Units Subcutaneous Q4H  . levothyroxine  25 mcg Per Tube Q0600  . mouth rinse  15 mL Mouth  Rinse 10 times per day  . metoprolol tartrate  25 mg Per Tube BID  . metroNIDAZOLE  500 mg Per Tube Q8H  . nutrition supplement (JUVEN)  1 packet Per Tube BID BM  . pantoprazole sodium  40 mg Per Tube Daily  . phenylephrine 200 mcg / ml (NON-ED USE ONLY) injection  100 mcg Intracavernosal Once  . vancomycin  500 mg Intravenous Q M,W,F-HD   Continuous Infusions: . sodium chloride Stopped (03/09/20 1302)  . meropenem (MERREM) IV 500 mg (03/22/20 1746)   Followed by  . 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  Status post trach due to respiratory failure from Covid pneumonia.He ison trach collar.PCCM following. Most recently pneumonia has been multidrug-resistant Pseudomonas for which she was treated with Avycaz and mycin nebulizers at [facility]. Possible ongoing aspiration also. Presented with hemoptysis, aspiration of blood. Patient has cuffed trach placed by ENT.No active bleeding on bronchoscopy. He also underwent tracheostomy tube change by PCCM for persistent leak on 03/01/20. Chest x-ray done on 03/03/2020 showed bilateral pleural effusions, stable extensive opacity throughout the left hemithorax. Currently he is on trach collar, requiring 8 L of oxygen per minute. He continues to receive duonebs and CPT. Volume control per HD. The patient was been taken back to HD on the afternoon of 03/15/2020. He was returned to the floor in extremis with saturations in the 70's on NRB.  Rapid Response called. Family rescinded DNR and stated that they (Mother, Sister, and brother) that they wished for him to be ventilated. I apprecate Dr. Jorja Loa help. He has now been disconnected from vent and   HCAP: Pt with purulent secretions from trach. WBC is elevated as is procalcitonin. Lactic acid is negative. The patient is receiving Vanc, Flagyl, and Merrem.  Sepsis: Resolved. Hypotension, tachycardia, elevated WBC. Assumed to be due to HCAP with worsening Lt>Rt infiltrates on CXR. The patient is receiving Vanc, Flagyl, and Merrem.  CVA: Last night on physical exam the patient appeared to have suffed a catastrophic CVA. Eyes were sharply deviated to the right, and he was unresponsive. Initial CT head was negative for new stroke. Will repeat.  Paroxysmal A. WUJ:WJXBJYNWG rate is controlled. Anticoagulation was discontinued due to suspected GI bleed. He is currently on low-dose Eliquis. Added low dose toprol xl 12.5 mg qd for improved rate control.  Sepsis/bacteremia:Enterococcus and Staphylococcus epidermidis bacteremia HD cath sepsis: TDC removed 5/26. Also has osteomyelitis fifth sacral segment. There is high suspicion for ongoing aspiration. MRI pelvis/sacrum showed osteomyelitis of fifth sacral segment, edema in the muscle around both hips and proximal thighs with possible myositis. ID had recommended treatment for 6 weeks for  sacral osteomyelitis and staph epidermidis bacteremia with vancomycin, fortaz and metronidazole. IR consulted for bone biopsy but due to high risk it was not done. He has persistent leukocytosis. Continue to monitor CBC.  Will repeat blood cultrues. Respiratory cultures from 03/16/2020 have had no growth. Blood cultures x 2 have been repeated. They have had no growth.  ESRD:on HD since mid March 2021. AKI is due to gent toxicity. HD MWF at [facility]. S/P new Tunnel catheter placement on  6/2 by IR. HD today again. Now back to MWFschedule.  Chronic  normocytic anemia: Secondary to ESRD, critical illness. Has been transfused with PRBCs. Currently hemoglobin stable. Continue to monitor. No HD yesterday due to acute drop in saturations.  Unstageable sacral pressure ulcer/osteomyelitis:Wound care following. Discussion as above.  Cachexia/severe protein malnutrition:On tube feeds. Nutrition following.  Mild elevated LFTs:Continue to monitor.  Pressure Injury 02/14/20 Sacrum Medial Stage 4 - Full thickness tissue loss with exposed bone, tendon or muscle. (Active)  02/14/20 1906  Location: Sacrum  Location Orientation: Medial  Staging: Stage 4 - Full thickness tissue loss with exposed bone, tendon or muscle.  Wound Description (Comments):   Present on Admission: Yes    Pressure Injury 03/03/20 Neck Anterior Stage 3 - Full thickness tissue loss. Subcutaneous fat may be visible but bone, tendon or muscle are NOT exposed. Device related injury under trach tube. (Active)  03/03/20 1805  Location: Neck  Location Orientation: Anterior  Staging: Stage 3 - Full thickness tissue loss. Subcutaneous fat may be visible but bone, tendon or muscle are NOT exposed.  Wound Description (Comments): Device related injury under trach tube.   Present on Admission: No   I have seen and examined this patient myself. I have spent 38 minutes in his evaluation and care.  DVT Prophylaxis: Eliquis  CODE STATUS: Full Code. 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 We have  requested for palliativecare evaluation.plz see note-trying to have discussion with  patient  Per palliative-family had a meeting and that they wish to continue all  aggressive cares, including vent and dialysis. Per palliative care on 6/14 family is not  interested in meeting with them anymore and have no interest in de-escalating  care. Palliative care has signed off.  Family Communication: I spoke  with the family on the night that the patient had  respiratory arrest. They have not been back to visit the patient. The mother, brother,  and sister all stated that they wanted the patient back on the vent. They rescinded  his DNR. They are not present today either. Disposition: Patient is from [place]. Anticipate discharge to LTAC/SNF but due to lack of insurance, placement has been difficult. Social working Sports administrator.  Disposition Plan:  Status is: Inpatient Remains inpatient appropriate because:Unsafe d/c plan Dispo: The patient is from:[facility] Anticipated d/c is to: SNF/LTAC Anticipated d/c date is: Unknown Barriers to discharge: funding for LTAC placement/SNF placement  Destyn Parfitt, DO [facility] Direct contact: see [email or web address]  7PM-7AM contact night coverage as above 03/22/2020, 7:05 PM  LOS: 28 days

## 2020-03-22 NOTE — Progress Notes (Signed)
NAME:  Shane Banks, MRN:  [id number], DOB:  Dec 16, 1945, LOS: 59 ADMISSION DATE:  02/14/2020, CONSULTATION DATE:  5/19 REFERRING MD:  Dr. Alvino Chapel, CHIEF COMPLAINT:  Hemoptysis   Brief History   74 yo m with PMHx of afib with RVR, BPH, stroke, and COVID ARDS in January requiring trach and LTACH placement at [facility]. Had been tolerating trach collar, but on 5/19 developed hemoptysis and hypoxemic respiratory failure requiring vent. Transferred to [facility] on 5/19.  Past Medical History   has a past medical history of Acute on chronic respiratory failure with hypoxia ([place]), Atrial fibrillation with RVR ([place]), BPH (benign prostatic hyperplasia), COVID-19 virus infection, Pneumonia due to COVID-19 virus, Severe sepsis ([place]), and Stroke ([place]).  Significant Hospital Events   1/8  Admit to [facility] for COVID PNA 2/23 Transfer to [facility] on vent 5/19 Transfer to [facility] for hemoptysis. #6 cuffed trach placed. Back on vent. Bronch performed 5/20 Transitioned to trach collar.  5/22 back on vent full time 5/24 back on antibiotics for fevers 6/1 Initiating PSV weans 6/3 Large cuff leak, trach exchange 6/7 transitioned to [facility] 6/9 remains on TC continuously since 6/7 6/23-tolerating trach collar today 6/25-left of the ventilator last evening and tolerated well  Consults:  ENT Nephrology ID PCCM  Procedures:  ENT flex scope 5/19 > no bleeding source identified FOB 5/19 > no pulmonary bleeding source identified.   Significant Diagnostic Tests:  CXR 5/28 >> R CVC placement confirmed Slightly improved aeration at the right base. 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 Edema in the muscles around the hips and in the posterior paraspinal musculature of the lower lumbar spine and in the buttocks which  could represent myositis. Fluid-fluid level in the bladder may represent protein or debris in the bladder. The possibility of urinary tract infection should be considered.  CXR 6/3> improved R lung aeration, worsening L sided opacity. Tracheostomy tube, HD catheter.   CXR 6/4>wosened R lung infiltrates and stable L sided infiltrate. Possible bilateral pleural effusions  Chest x-ray 6/24-basal infiltrates, stable from prior  Micro Data:    tracheal aspirate 5/7> for Pseudomonas A  BAL 5/19 > no growth 5/19 blood > NG 5/22 trach aspirate>> candida [place] 5/22 blood>> 1/4 GPC> staph epi 5/25 blood>> GPC (enterococcus on biofire) 2/4 bottles>> E. Faecalis, staph epi. 5/25 trach aspirate>>many PMN, few GPR, rare GPC> diphtheroids 5/25 blood cx>> + for Enterococcus Faecalis (gent resistant)/ Staphylococcus Epidermidis (tetracycline, vanc, rifampin sensitive) 5/27 BCx> ngtd    Antimicrobials:  Levofloxacin 5/24>5/26 vanc 5/25> Ceftriaxone  5/27>6/1 Metronidazole 5/27>  [name] 6/1> Meropenem 6/21  To continue antibiotics up until 04/04/2020  Interim history/subjective:  Returned to ICU 6/19 for increased secretions requiring mechanical ventilation No overnight events, currently on ATM this morning appears to be tolerating it okay Requiring suctioning  Tolerating trach collar  Objective   Blood pressure 121/77, pulse 87, temperature 98.5 F (36.9 C), temperature source Axillary, resp. rate (!) 43, height _0  (1.753 m), weight 64.8 kg, SpO2 100 %.    FiO2 (%):  [40 %] 40 %   Intake/Output Summary (Last 24 hours) at 03/22/2020 0959 Last data filed at 03/22/2020 0600 Gross per 24 hour  Intake 860 ml  Output 700 ml  Net 160 ml   Filed Weights   03/20/20 1204 03/21/20 0454 03/22/20 0500  Weight:  62.9 kg 63.2 kg 64.8 kg    Examination:   Chronically ill-appearing Tolerating trach collar Tracheostomy with tan secretions Chest: Clear breath sounds anteriorly CVS:  S1-S2 appreciated Nondistended, PEG in place ostomy in place positive bowel sounds 1+ lower extremity edema Interactive, concerns  Chest x-ray 6/24 reviewed-stable findings reviewed by myself Resolved Hospital Problem list     Assessment & Plan:   Acute on chronic hypoxemic respiratory failure requiring tracheostomy, prolonged MV COVID fibrosis,  Deconditioning multiple HCAPs. 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 -On antibiotics -Appreciate infectious disease and pharmacy input  End-stage renal disease on hemodialysis. -Continue hemodialysis -HD today   Limited options for further intervention.  Family has been resistant to placing any limitations in care.  No significant progress at present  Will transfer to the medical floor hospitalist service today   Daily Goals Checklist  Pain/Anxiety/Delirium protocol (if indicated): As needed only VAP protocol (if indicated): Bundle in place Respiratory support goals: Daily SBT/trach collar trial Blood pressure target: Keep MAP greater than 65 titrate low-dose norepinephrine DVT prophylaxis: Apixaban Nutrition Status: High nutritional risk.  On tube feeds. GI prophylaxis: Pantoprazole Fluid status goals: Likely volume contracted.  Gentle hydration. Urinary catheter: Condom catheter Central lines: Chronic HD catheter. Glucose control: Currently euvolemic  Mobility/therapy needs: Bedbound at baseline Antibiotic de-escalation: On prolonged course of meropenem, metronidazole and vancomycin for  chronic osteomyelitis. Home medication reconciliation: None Daily labs: CBC BMP Code Status: Full code Family Communication: Family updated by hospitalist 6/19 Disposition: MedSurg.  The patient is critically ill with multiple organ systems failure and requires high complexity decision making for assessment and support, frequent evaluation and titration of therapies, application of advanced monitoring technologies and extensive interpretation of multiple databases. Critical Care Time devoted to patient care services described in this note independent of APP/resident time (if applicable)  is 30 minutes.   Sherrilyn Rist MD [facility] Personal pager: [phone] If unanswered, please page CCM On-call: [phone]

## 2020-03-22 NOTE — Plan of Care (Signed)
  Problem: Education: Goal: Knowledge of General Education information will improve Description: Including pain rating scale, medication(s)/side effects and non-pharmacologic comfort measures Outcome: Not Progressing   Problem: Health Behavior/Discharge Planning: Goal: Ability to manage health-related needs will improve Outcome: Not Progressing   Problem: Clinical Measurements: Goal: Ability to maintain clinical measurements within normal limits will improve Outcome: Not Progressing Goal: Will remain free from infection Outcome: Not Progressing Goal: Diagnostic test results will improve Outcome: Not Progressing Goal: Respiratory complications will improve Outcome: Not Progressing

## 2020-03-22 NOTE — Progress Notes (Signed)
Off ventilator and stable  PCCM will sign off

## 2020-03-22 NOTE — Progress Notes (Signed)
Pharmacy Antibiotic Note  Shane Banks is a 74 y.o. male admitted on 02/14/2020 with CoNS/E.faecalis bacteremia/sacral osteomyelitis/pneumonia. 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 Line pulled 5/26, line holiday and had it replaced 5/28. TTE negative. Repeat BCx are negative. ID planning 6 weeks of therapy.  HD today Random vanc lvl this am 15   Height: 5\' 9"  (175.3 cm) Weight: 64.8 kg (142 lb 13.7 oz) IBW/kg (Calculated) : 70.7  Temp (24hrs), Avg:98.7 F (37.1 C), Min:98 F (36.7 C), Max:99.8 F (37.7 C)  Recent Labs  Lab 03/17/20 0410 03/17/20 2032 03/18/20 0428 03/19/20 0201 03/20/20 0344 03/21/20 0746 03/22/20 0320  WBC   < >  --  14.7* 13.9* 14.2* 13.6* 13.1*  CREATININE  --   --  3.78*  --  2.92* 2.09* 2.70*  LATICACIDVEN  --  1.0 1.3  --   --   --   --   VANCORANDOM  --   --   --   --   --   --  15   < > = values in this interval not displayed.    Estimated Creatinine Clearance: 22.7 mL/min (A) (by C-G formula based on SCr of 2.7 mg/dL (H)).    Allergies  Allergen Reactions  . Aspirin Rash  . Cephalexin Other (See Comments)    dizziness  . Penicillins     Itching,swelling    Plan: Give Vanc 500 mg x 1 pre-HD Continue Vanc 500mg  IV qHD - scheduled for MWF with HD Meropenem 500 mg q24h x 1 week Then return to fortaz 1 g q24h to complete course through 7/8 vanc level mid-week  [name], PharmD, BCPS, BCCCP Clinical Pharmacist [phone]  Please check AMION for all [facility] numbers  03/22/2020 8:26 AM

## 2020-03-22 NOTE — Progress Notes (Signed)
[facility] Progress Note  Subjective:    Seems to have maintained on  trach collar since yesterday-  More alert  Vitals:   03/22/20 0800 03/22/20 0813 03/22/20 0900 03/22/20 0923  BP: 117/68  121/77   Pulse:   88 87  Resp:   (!) 31 (!) 43  Temp:  98.5 F (36.9 C)    TempSrc:  Axillary    SpO2:   100% 100%  Weight:      Height:        Exam:   In ICU on vent-  More alert and interactive today   no jvd  Chest coarse BS bilat  Cor reg no RG  Abd soft ntnd no ascites, +llq ostomy, +peg tube   Ext mild pitting  LE edema   Nonfocal, +gen'd weakness        Summary: [facility] after COVID hospitalization which included mech ventilation and trach placement. At [facility] had severe infections/ bacteremia/ PNA and developed AKI felt due to IV gent toxicity requiring initiation of HD 12/16/19. Admitted to [facility] for trach bleed on 5/19. Was getting MWF HD at [facility] prior to admit here.   OP HD: started March 2021 at [facility] as above MWF   Getting HD here 3.5- 4h, UF 0.5- 1.5 L, hep 2000 prn   admit 62.8kg > range 56.9- 64.7kg here    Assessment/ Plan: 1. 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 Was on the floor for a while, went back to ICU and back on vent this past week.  Full code now.  Is on 40% FiO2. CXR bilat infiltrates, continues on broad-spec IV abx. Concern is for ongoing aspiration- now back on TC  2. Enterococcus/ MRSE bacteremia HD cath sepsis - TDC removed 5/26, per ID getting 6 wks vanc/ fortaz/ flagyl for polymicrobial cath sepsis and sacral osteomyelitis.  3. Sacral osteo/ decub stage IV - as per #2 4. ESRD - on HD since mid March 2021. HD MWF at [facility]. S/P new TDC 6/2 by IR. Next HD today to keep MWF.  5. BP/vol - no sig vol excess now by exam, wt's stable around 60-63kg, unable to UF on HD this past week.  Now with some dep pitting edema-  Will try some UF today  6. Hypokalemia-   Running on high K bath-  Will order daily labs and treat as needed    7. AMS - variable, seems overall improved 8. Atrial fib - per primary, on eliquis 2.5 BID 9. Anemia ckd - transfuse prn, Hb remains 9- 10 range. on ESA 10. Hypercalcemia:  w/ low PTH, prob from immobility, corrCa 11.0 > 10.5>10.3, improved. Cont low 2.0 Ca++ bath , PTH 29. If recurs will consider pamidronate or similar. 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 SP COVID pna - original problem in Feb at OSH 13. DNR/ poor prognosis-  LTACH only option for disposition given needs but apparently not a candidate. Family asked not to be contacted by PCT anymore. PCT have now signed off.  For now cont plan of care.    Louis Meckel  03/22/2020, 9:58 AM   Recent Labs  Lab 03/21/20 0746 03/22/20 0320  K 3.3* 3.4*  BUN 62* 98*  CREATININE 2.09* 2.70*  CALCIUM 8.9 9.4  PHOS 2.0* 3.0  HGB 9.0* 9.3*   Inpatient medications: . apixaban  2.5 mg Per Tube BID  . chlorhexidine gluconate (  MEDLINE KIT)  15 mL Mouth Rinse BID  . Chlorhexidine Gluconate Cloth  6 each Topical Q0600  . Chlorhexidine Gluconate Cloth  6 each Topical Q0600  . Chlorhexidine Gluconate Cloth  6 each Topical Q0600  . Chlorhexidine Gluconate Cloth  6 each Topical Q0600  . Chlorhexidine Gluconate Cloth  6 each Topical Q0600  . darbepoetin (ARANESP) injection - DIALYSIS  100 mcg Intravenous Q Mon-HD  . feeding supplement (NEPRO CARB STEADY)  1,000 mL Per Tube Q24H  . feeding supplement (PRO-STAT SUGAR FREE 64)  30 mL Per Tube TID  . insulin aspart  0-6 Units Subcutaneous Q4H  . insulin aspart  2 Units Subcutaneous Q4H  . levothyroxine  25 mcg Per Tube Q0600  . mouth rinse  15 mL Mouth Rinse 10 times per day  . metoprolol tartrate  25 mg Per Tube BID  . metroNIDAZOLE  500 mg Per Tube Q8H  . nutrition supplement (JUVEN)  1 packet Per Tube BID BM  . pantoprazole sodium  40 mg Per Tube Daily  . phenylephrine 200 mcg / ml (NON-ED USE ONLY) injection  100  mcg Intracavernosal Once  . vancomycin  500 mg Intravenous Q M,W,F-HD   . sodium chloride Stopped (03/09/20 1302)  . meropenem (MERREM) IV 500 mg (03/21/20 1720)   Followed by  . [START ON 03/25/2020] cefTAZidime (FORTAZ)  IV    . vancomycin 500 mg (03/22/20 0914)   sodium chloride, acetaminophen (TYLENOL) oral liquid 160 mg/5 mL, docusate, fentaNYL (SUBLIMAZE) injection, heparin, heparin, ipratropium-albuterol, metoprolol tartrate, polyethylene glycol

## 2020-03-22 NOTE — Progress Notes (Signed)
  Speech Language Pathology Treatment: Dysphagia;Passy Muir Speaking valve  Patient Details Name: [facility] MRN: [id number] DOB: March 30, 1946 Today's Date: 03/22/2020 Time: 7902-4097 SLP Time Calculation (min) (ACUTE ONLY): 20 min  Assessment / Plan / Recommendation Clinical Impression  Pt's awareness, attention and response time improved this session. His most significant limitations toward conversational speech and swallowing is respiratory support. He is unable to produce effective and productive cough on command and reflexive appears diminished. Consumed, with valve donned, single ice chips with minimal s/s aspiration of delayed throat clear. Phonation was intermittently wet before and after ice. Pt asked pt about the back of his head. He frequently moves head back and forth on pillow and the other day he had a bandaide and appears he had/has a wound that itches. Respiration supported phrase level responses with RR 28-31. Making improvements towards goals and is ready for repeat instrumental assessment that can be addressed next week. Pt can wear valve during therapies with full supervision.    HPI HPI: 74 yo m with PMHx of afib with RVR, BPH, stroke, and COVID ARDS in January requiring trach and LTACH placement at [facility]. Had been tolerating trach collar, but on 5/19 developed hemoptysis and hypoxemic respiratory failure requiring vent. Transferred to [facility] on 5/19. PMSV desired for discussion with pt regarding goals of care.      SLP Plan  Continue with current plan of care (instrumental swallow assessment)       Recommendations  Diet recommendations: NPO Medication Administration: Via alternative means      Patient may use Passy-Muir Speech Valve: During all therapies with supervision PMSV Supervision: Full MD: Please consider changing trach tube to : Smaller size;Cuffless         Oral Care Recommendations: Oral care QID Follow up Recommendations: LTACH SLP  Visit Diagnosis: Aphasia (R47.01);Dysphagia, unspecified (R13.10) Plan: Continue with current plan of care (instrumental swallow assessment)                      [name] 03/22/2020, 12:16 PM   Orbie Pyo Colvin Caroli.Ed Risk analyst [phone] Office [phone]

## 2020-03-22 NOTE — Progress Notes (Addendum)
Physical Therapy Treatment Patient Details Name: Shane Banks MRN: [id number] DOB: Nov 14, 1945 Today's Date: 03/22/2020    History of Present Illness 74 yo m with PMHx of afib with RVR, BPH, stroke, and COVID ARDS in January requiring trach and LTACH placement at [facility]. Had been tolerating trach collar, but on 5/19 developed hemoptysis and hypoxemic respiratory failure requiring vent. Transferred to [facility] on 5/19.  5/19 back on vent, 6/7 transitioned to [facility]    PT Comments    Pt continues to require +2 total assist bed mobility. With bed in chair position, pull to sit x 3 trials of 30 seconds each. Max assist to maintain. PROM BLE. Bed remained in chair position at end of session. Resting hand splints in place. RN aware. Pt on 10L O2 via trach collar during session.    Follow Up Recommendations  SNF;Supervision/Assistance - 24 hour     Equipment Recommendations  Other (comment) (defer to next venue)    Recommendations for Other Services       Precautions / Restrictions Precautions Precautions: Fall;Other (comment) Precaution Comments: trach collar, PEG    Mobility  Bed Mobility Overal bed mobility: Needs Assistance Bed Mobility: Rolling;Supine to Sit;Sit to Supine Rolling: Total assist   Supine to sit: Total assist;+2 for physical assistance Sit to supine: Total assist;+2 for physical assistance   General bed mobility comments: Pull forward to sitting with bed in chair position. Max assist to maintain sitting without bed support on trunk. Pull to sit x 3 trials of 30 seconds.  Transfers                    Ambulation/Gait                 Stairs             Wheelchair Mobility    Modified Rankin (Stroke Patients Only)       Balance Overall balance assessment: Needs assistance Sitting-balance support: Feet unsupported;No upper extremity supported Sitting balance-Leahy Scale: Zero Sitting balance - Comments: max assist to  maintain sitting balance                                    Cognition Arousal/Alertness: Awake/alert Behavior During Therapy: Flat affect Overall Cognitive Status: Difficult to assess                                 General Comments: Slow to respond. Constant cues to stay on task. Answers 'yes' and 'a little bit' to most questions.      Exercises Other Exercises Other Exercises: PROM BLE Other Exercises: donned resting hand splints end of session    General Comments General comments (skin integrity, edema, etc.): 10L O2 via trach collar during session      Pertinent Vitals/Pain Pain Assessment: Faces Faces Pain Scale: Hurts even more Pain Location: ROM/mobility RUE Pain Descriptors / Indicators: Discomfort;Grimacing;Guarding Pain Intervention(s): Limited activity within patient's tolerance;Repositioned;Monitored during session    Home Living                      Prior Function            PT Goals (current goals can now be found in the care plan section) Acute Rehab PT Goals Patient Stated Goal: not stated PT Goal Formulation: Patient unable to participate  in goal setting Time For Goal Achievement: 04/03/20 Potential to Achieve Goals: Fair Progress towards PT goals: Not progressing toward goals - comment (no functional change noted)    Frequency    Min 2X/week      PT Plan Current plan remains appropriate    Co-evaluation              AM-PAC PT "6 Clicks" Mobility   Outcome Measure  Help needed turning from your back to your side while in a flat bed without using bedrails?: Total Help needed moving from lying on your back to sitting on the side of a flat bed without using bedrails?: Total Help needed moving to and from a bed to a chair (including a wheelchair)?: Total Help needed standing up from a chair using your arms (e.g., wheelchair or bedside chair)?: Total Help needed to walk in hospital room?: Total Help  needed climbing 3-5 steps with a railing? : Total 6 Click Score: 6    End of Session Equipment Utilized During Treatment: Oxygen Activity Tolerance: Patient tolerated treatment well Patient left: with call bell/phone within reach;in bed;with bed alarm set Nurse Communication: Mobility status PT Visit Diagnosis: Muscle weakness (generalized) (M62.81);Adult, failure to thrive (R62.7);Other abnormalities of gait and mobility (R26.89);Pain     Time: 5284-1324 PT Time Calculation (min) (ACUTE ONLY): 24 min  Charges:  $Therapeutic Exercise: 8-22 mins $Therapeutic Activity: 8-22 mins                     Lorrin Goodell, PT  Office # [phone] Pager [phone]    Lorriane Shire 03/22/2020, 11:41 AM

## 2020-03-23 LAB — GLUCOSE, CAPILLARY
Glucose-Capillary: 103 mg/dL — ABNORMAL HIGH (ref 70–99)
Glucose-Capillary: 108 mg/dL — ABNORMAL HIGH (ref 70–99)
Glucose-Capillary: 147 mg/dL — ABNORMAL HIGH (ref 70–99)
Glucose-Capillary: 103 mg/dL — ABNORMAL HIGH (ref 70–99)
Glucose-Capillary: 139 mg/dL — ABNORMAL HIGH (ref 70–99)

## 2020-03-23 LAB — CBC WITH DIFFERENTIAL/PLATELET
Abs Immature Granulocytes: 0.22 10*3/uL — ABNORMAL HIGH (ref 0.00–0.07)
Basophils Absolute: 0.1 10*3/uL (ref 0.0–0.1)
Basophils Relative: 1 %
Eosinophils Absolute: 0.5 10*3/uL (ref 0.0–0.5)
Eosinophils Relative: 4 %
HCT: 29.9 % — ABNORMAL LOW (ref 39.0–52.0)
Hemoglobin: 9.1 g/dL — ABNORMAL LOW (ref 13.0–17.0)
Immature Granulocytes: 2 %
Lymphocytes Relative: 17 %
Lymphs Abs: 2.3 10*3/uL (ref 0.7–4.0)
MCH: 25.2 pg — ABNORMAL LOW (ref 26.0–34.0)
MCHC: 30.4 g/dL (ref 30.0–36.0)
MCV: 82.8 fL (ref 80.0–100.0)
Monocytes Absolute: 1.8 10*3/uL — ABNORMAL HIGH (ref 0.1–1.0)
Monocytes Relative: 13 %
Neutro Abs: 9.2 10*3/uL — ABNORMAL HIGH (ref 1.7–7.7)
Neutrophils Relative %: 63 %
Platelets: 242 10*3/uL (ref 150–400)
RBC: 3.61 MIL/uL — ABNORMAL LOW (ref 4.22–5.81)
RDW: 18.8 % — ABNORMAL HIGH (ref 11.5–15.5)
WBC: 14.1 10*3/uL — ABNORMAL HIGH (ref 4.0–10.5)
nRBC: 0 % (ref 0.0–0.2)

## 2020-03-23 LAB — RENAL FUNCTION PANEL
Albumin: 2.2 g/dL — ABNORMAL LOW (ref 3.5–5.0)
Anion gap: 10 (ref 5–15)
BUN: 45 mg/dL — ABNORMAL HIGH (ref 8–23)
CO2: 26 mmol/L (ref 22–32)
Calcium: 9.1 mg/dL (ref 8.9–10.3)
Chloride: 99 mmol/L (ref 98–111)
Creatinine, Ser: 1.82 mg/dL — ABNORMAL HIGH (ref 0.61–1.24)
GFR calc Af Amer: 42 mL/min — ABNORMAL LOW (ref >60)
GFR calc non Af Amer: 36 mL/min — ABNORMAL LOW (ref >60)
Glucose, Bld: 109 mg/dL — ABNORMAL HIGH (ref 70–99)
Phosphorus: 2.1 mg/dL — ABNORMAL LOW (ref 2.5–4.6)
Potassium: 3.6 mmol/L (ref 3.5–5.1)
Sodium: 135 mmol/L (ref 135–145)

## 2020-03-23 MED ORDER — ORAL CARE MOUTH RINSE
15.0000 mL | Freq: Two times a day (BID) | OROMUCOSAL | Status: DC
  Administered 2020-03-23 – 2020-04-26 (×61): 15 mL via OROMUCOSAL

## 2020-03-23 MED ORDER — CHLORHEXIDINE GLUCONATE 0.12 % MT SOLN
15.0000 mL | Freq: Two times a day (BID) | OROMUCOSAL | Status: DC
  Administered 2020-03-23 – 2020-04-26 (×67): 15 mL via OROMUCOSAL
  Filled 2020-03-23 (×64): qty 15

## 2020-03-23 NOTE — Progress Notes (Signed)
PROGRESS NOTE  Bostyn Bogie ERX:[id number] DOB: [birth year] DOA: 02/14/2020 PCP: [name], MD  Brief History   Patient is a 74 year old male with past medical history of chronic hypoxic respiratory failure status post tracheostomy, ESRD on dialysis, atrial fibrillation, nonhemorrhagic stroke who was admitted from [place] for the evaluation of trach site bleeding. Patient had recent history of Covid pneumonia and was on ventilator because he progressed to ARDS. Because of little improvement in the ventilatory and oxygenation, he underwent tracheostomy and was discharged to [facility]. He had recent history of UTI, multidrug-resistant Pseudomonas pneumonia, stenotrophomonas bacteremia. Because of sepsis and shock, he developed renal failure requiring dialysis. On 5/19, he developed hemoptysis and was transferred to [facility] ED for evaluation. PCCM,ID,Nephrology following here. 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 He also underwent tracheostomy tube change by PCCM for persistent leak. Due to lack of insurance, he cannot go back to [employer]. Social worker assisting on placement. Anticipate prolonged hospitalization.  1/8 Admit to [facility] for COVID PNA 2/23 Transfer to [facility] on vent 5/19 Transfer to [facility] for hemoptysis. #6 cuffed trach placed. Back on vent. Bronch performed 5/20 Transitioned to trach collar.  5/22 back on vent full time 5/24 back on antibiotics for fevers 6/1 Initiating PSV weans 6/3 Large cuff leak, trach exchange 6/7 transitioned to [facility] 6/9 remains on TC continuously since 6/7 03/15/2020: Pt returned to ICU for mechanical ventilation and pressors. Pt with sharply decreased saturations while in HD. Patient was requiring pressor support in ICU initially. He is now off of them, but continues to require vent support. Plan is to again wean him to trach collar. 03/21/2020: Pt taken off of vent.  03/22/2020: Pt transferred to  renal floor.  Consultants  . Palliative care . Wound care . Interventional Radiology . Infectious Disease . Nephrology . Neurosurgery . ENT . PCCM  Procedures  . Mechanical Ventilation . Tracheostomy . Trach exchange . Transition to trach collar . Flexible laryngeal endoscopy - no source of bleeding seen. . Bronchoscopy . Tunnelled catheter placement by IR . HD  Antibiotics   Anti-infectives (From admission, onward)   Start     Dose/Rate Route Frequency Ordered Stop   03/25/20 1800  cefTAZidime (FORTAZ) 1 g in sodium chloride 0.9 % 100 mL IVPB     Discontinue    "Followed by" Linked Group Details   1 g 200 mL/hr over 30 Minutes Intravenous Every 24 hours 03/18/20 1015 04/05/20 1759   03/22/20 0830  vancomycin (VANCOREADY) IVPB 500 mg/100 mL        500 mg 100 mL/hr over 60 Minutes Intravenous  Once 03/22/20 0827 03/22/20 1014   03/20/20 1200  vancomycin (VANCOCIN) IVPB 500 mg/100 ml premix     Discontinue     500 mg 100 mL/hr over 60 Minutes Intravenous Every M-W-F (Hemodialysis) 03/19/20 0941     03/18/20 1800  meropenem (MERREM) 500 mg in sodium chloride 0.9 % 100 mL IVPB     Discontinue    "Followed by" Linked Group Details   500 mg 200 mL/hr over 30 Minutes Intravenous Every 24 hours 03/18/20 1015 03/25/20 1759   03/18/20 1200  vancomycin (VANCOCIN) IVPB 500 mg/100 ml premix        500 mg 100 mL/hr over 60 Minutes Intravenous Every M-W-F (Hemodialysis) 03/18/20 0938 03/18/20 1700   03/18/20 1200  cefTAZidime (FORTAZ) 2 g in sodium chloride 0.9 % 100 mL IVPB  Status:  Discontinued  2 g 200 mL/hr over 30 Minutes Intravenous Every Mon (Hemodialysis) 03/18/20 0939 03/18/20 1015   03/07/20 1800  cefTAZidime (FORTAZ) 2 g in sodium chloride 0.9 % 100 mL IVPB  Status:  Discontinued        2 g 200 mL/hr over 30 Minutes Intravenous Every T-Th-Sa (1800) 03/06/20 1255 03/18/20 1015   03/07/20 1200  vancomycin (VANCOCIN) IVPB 500 mg/100 ml premix  Status:  Discontinued         500 mg 100 mL/hr over 60 Minutes Intravenous Every T-Th-Sa (Hemodialysis) 03/06/20 1255 03/19/20 0941   03/04/20 2000  cefTAZidime (FORTAZ) 2 g in sodium chloride 0.9 % 100 mL IVPB  Status:  Discontinued        2 g 200 mL/hr over 30 Minutes Intravenous Every M-W-F (2000) 03/01/20 1136 03/06/20 1255   03/02/20 1200  vancomycin (VANCOCIN) IVPB 500 mg/100 ml premix        500 mg 100 mL/hr over 60 Minutes Intravenous Every T-Th-Sa (Hemodialysis) 03/01/20 1131 03/02/20 1710   03/01/20 1230  vancomycin (VANCOCIN) IVPB 500 mg/100 ml premix  Status:  Discontinued        500 mg 100 mL/hr over 60 Minutes Intravenous Every M-W-F (Hemodialysis) 03/01/20 1131 03/06/20 1255   02/28/20 1110  vancomycin (VANCOREADY) IVPB 500 mg/100 mL        over 60 Minutes  Continuous PRN 02/28/20 1112 02/28/20 1110   02/28/20 1101  vancomycin (VANCOCIN) 1-5 GM/200ML-% IVPB  Status:  Discontinued       Note to Pharmacy: Lytle Butte   : cabinet override      02/28/20 1101 02/28/20 1327   02/27/20 1800  cefTAZidime (FORTAZ) 1 g in sodium chloride 0.9 % 100 mL IVPB        1 g 200 mL/hr over 30 Minutes Intravenous Every 24 hours 02/27/20 0947 03/03/20 1738   02/27/20 1200  vancomycin (VANCOCIN) IVPB 500 mg/100 ml premix        500 mg 100 mL/hr over 60 Minutes Intravenous Once 02/27/20 0829 02/27/20 1343   02/24/20 1800  vancomycin (VANCOREADY) IVPB 500 mg/100 mL        500 mg 100 mL/hr over 60 Minutes Intravenous  Once 02/24/20 1308 02/24/20 1905   02/22/20 2200  metroNIDAZOLE (FLAGYL) tablet 500 mg     Discontinue     500 mg Per Tube Every 8 hours 02/22/20 0957 04/04/20 2359   02/22/20 1700  cefTRIAXone (ROCEPHIN) 2 g in sodium chloride 0.9 % 100 mL IVPB  Status:  Discontinued        2 g 200 mL/hr over 30 Minutes Intravenous Every 24 hours 02/22/20 0957 02/27/20 0947   02/22/20 1200  vancomycin (VANCOCIN) IVPB 500 mg/100 ml premix  Status:  Discontinued        500 mg 100 mL/hr over 60 Minutes Intravenous Every  T-Th-Sa (Hemodialysis) 02/20/20 1253 02/21/20 1040   02/21/20 1431  vancomycin variable dose per unstable renal function (pharmacist dosing)  Status:  Discontinued         Does not apply See admin instructions 02/21/20 1431 03/01/20 1131   02/21/20 1200  vancomycin (VANCOREADY) IVPB 500 mg/100 mL        500 mg 100 mL/hr over 60 Minutes Intravenous  Once 02/21/20 0956 02/21/20 1253   02/21/20 0714  vancomycin (VANCOCIN) 500-5 MG/100ML-% IVPB  Status:  Discontinued       Note to Pharmacy: Ashley Akin   : cabinet override      02/21/20 0714 02/21/20  1346   02/20/20 1400  vancomycin (VANCOREADY) IVPB 1250 mg/250 mL        1,250 mg 166.7 mL/hr over 90 Minutes Intravenous  Once 02/20/20 1253 02/20/20 1538   02/20/20 1200  levofloxacin (LEVAQUIN) IVPB 500 mg  Status:  Discontinued        500 mg 100 mL/hr over 60 Minutes Intravenous Every 48 hours 02/20/20 1020 02/21/20 0943   02/15/20 1545  vancomycin (VANCOCIN) IVPB 1000 mg/200 mL premix  Status:  Discontinued       "Followed by" Linked Group Details   1,000 mg 200 mL/hr over 60 Minutes Intravenous Every 24 hours 02/14/20 1530 02/14/20 1745   02/15/20 0330  ceFEPIme (MAXIPIME) 2 g in sodium chloride 0.9 % 100 mL IVPB  Status:  Discontinued        2 g 200 mL/hr over 30 Minutes Intravenous Every 12 hours 02/14/20 1531 02/14/20 1531   02/14/20 1531  ceFEPIme (MAXIPIME) 2 g in sodium chloride 0.9 % 100 mL IVPB  Status:  Discontinued        2 g 200 mL/hr over 30 Minutes Intravenous Every 12 hours 02/14/20 1531 02/14/20 1745   02/14/20 1530  ceFEPIme (MAXIPIME) 2 g in sodium chloride 0.9 % 100 mL IVPB  Status:  Discontinued        2 g 200 mL/hr over 30 Minutes Intravenous  Once 02/14/20 1520 02/14/20 1531   02/14/20 1530  vancomycin (VANCOREADY) IVPB 1500 mg/300 mL  Status:  Discontinued       "Followed by" Linked Group Details   1,500 mg 150 mL/hr over 120 Minutes Intravenous  Once 02/14/20 1530 02/14/20 1745     .   Subjective  The  patient is resting comfortably. He is awake and alert.   Objective   Vitals:  Vitals:   03/23/20 1600 03/23/20 1700  BP: 120/67 127/60  Pulse: 88 90  Resp: (!) 40 (!) 49  Temp: 99 F (37.2 C)   SpO2: 99% 98%   Exam:  Constitutional:  . Patient is awake, nonverbal. No acute distress. Neck:  . neck appears normal, no masses, normal ROM, supple . no thyromegaly . Trach stable. Respiratory:  . No increased work of breathing. . No wheezes, rales, or rhonchi . No tactile fremitus . Coarse breath sounds bilaterally Cardiovascular:  . Regular rate and rhythm . No murmurs, ectopy, or gallups. . No lateral PMI. No thrills. Abdomen:  . Abdomen is soft, non-tender, non-distended . No hernias, masses, or organomegaly . Normoactive bowel sounds.  Musculoskeletal:  . No cyanosis, clubbing . Mild edema Skin:  . No rashes, lesions, ulcers . palpation of skin: no induration or nodules Neurologic:  . Patient is unable to cooperate with exam. Psychiatric:  Patient is unable to cooperate with exam.  I have personally reviewed the following:   Today's Data  . Vitals, CBC, BMP, Glucoses  Micro Data  . Blood cultures x 2 (03/03/2020) No growth  Scheduled Meds: . apixaban  2.5 mg Per Tube BID  . chlorhexidine  15 mL Mouth Rinse BID  . chlorhexidine gluconate (MEDLINE KIT)  15 mL Mouth Rinse BID  . Chlorhexidine Gluconate Cloth  6 each Topical Q0600  . darbepoetin (ARANESP) injection - DIALYSIS  100 mcg Intravenous Q Mon-HD  . feeding supplement (NEPRO CARB STEADY)  1,000 mL Per Tube Q24H  . feeding supplement (PRO-STAT SUGAR FREE 64)  30 mL Per Tube TID  . insulin aspart  0-6 Units Subcutaneous Q4H  . insulin  aspart  2 Units Subcutaneous Q4H  . levothyroxine  25 mcg Per Tube Q0600  . mouth rinse  15 mL Mouth Rinse q12n4p  . metoprolol tartrate  25 mg Per Tube BID  . metroNIDAZOLE  500 mg Per Tube Q8H  . nutrition supplement (JUVEN)  1 packet Per Tube BID BM  . pantoprazole  sodium  40 mg Per Tube Daily  . phenylephrine 200 mcg / ml (NON-ED USE ONLY) injection  100 mcg Intracavernosal Once  . vancomycin  500 mg Intravenous Q M,W,F-HD   Continuous Infusions: . sodium chloride Stopped (03/09/20 1302)  . meropenem (MERREM) IV 500 mg (03/22/20 1746)   Followed by  . 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  Status post trach due to respiratory failure from Covid pneumonia.He ison trach collar.PCCM following. Most recently pneumonia has been multidrug-resistant Pseudomonas for which she was treated with Avycaz and mycin nebulizers at [facility]. Possible ongoing aspiration also. Presented with hemoptysis, aspiration of blood. Patient has cuffed trach placed by ENT.No active bleeding on bronchoscopy. He also underwent tracheostomy tube change by PCCM for persistent leak on 03/01/20. Chest x-ray done on 03/03/2020 showed bilateral pleural effusions, stable extensive opacity throughout the left hemithorax. Currently he is on trach collar, requiring 8 L of oxygen per minute. He continues to receive duonebs and CPT. Volume control per HD. The patient was been taken back to HD on the afternoon of 03/15/2020. He was returned to the floor in extremis with saturations in the 70's on NRB. Rapid Response called. Family rescinded DNR and stated that they (Mother, Sister, and brother) that they wished for him to be ventilated. I apprecate Dr. Jorja Loa help. He has now  been disconnected from vent and   HCAP: Pt with purulent secretions from trach. WBC is elevated as is procalcitonin. Lactic acid is negative. The patient is receiving Vanc, Flagyl, and Merrem.  Sepsis: Resolved. Hypotension, tachycardia, elevated WBC. Assumed to be due to HCAP with worsening Lt>Rt infiltrates on CXR. The patient is receiving Vanc, Flagyl, and Merrem.  CVA: Last night on physical exam the patient appeared to have suffed a catastrophic CVA. Eyes were sharply deviated to the right, and he was unresponsive. Initial CT head was negative for new stroke. Will repeat.  Paroxysmal A. PNP:YYFRTMYTR rate is controlled. Anticoagulation was discontinued due to suspected GI bleed. He is currently on low-dose Eliquis. Added low dose toprol xl 12.5 mg qd for improved rate control.  Sepsis/bacteremia:Enterococcus and Staphylococcus epidermidis bacteremia HD cath sepsis: TDC removed 5/26. Also has osteomyelitis fifth sacral segment. There is high suspicion for ongoing aspiration. MRI pelvis/sacrum showed osteomyelitis of fifth sacral segment, edema in the muscle around both hips and proximal thighs with possible myositis. ID had recommended treatment for 6 weeks for sacral osteomyelitis and staph epidermidis bacteremia with vancomycin, fortaz and metronidazole. IR consulted for bone biopsy but due to high risk it was not done. He has persistent leukocytosis. Continue to monitor CBC.  Will repeat blood cultrues. Respiratory cultures from 03/16/2020 have had no growth. Blood cultures x 2 have been repeated. They have had no growth.  ESRD:on HD since mid March 2021. AKI is due to gent toxicity. HD MWF at [facility]. S/P new Tunnel catheter placement on  6/2 by IR. HD today again. Now back to MWFschedule.  Chronic normocytic anemia: Secondary to ESRD, critical illness. Has been transfused with PRBCs. Currently hemoglobin stable. Continue to monitor. No HD yesterday due to acute drop in  saturations.  Unstageable sacral pressure ulcer/osteomyelitis:Wound care following. Discussion as above.  Cachexia/severe protein malnutrition:On tube feeds. Nutrition following.  Mild elevated LFTs:Continue to monitor.  Pressure Injury 02/14/20 Sacrum Medial Stage 4 - Full thickness tissue loss with exposed bone, tendon or muscle. (Active)  02/14/20 1906  Location: Sacrum  Location Orientation: Medial  Staging: Stage 4 - Full thickness tissue loss with exposed bone, tendon or muscle.  Wound Description (Comments):   Present on Admission: Yes    Pressure Injury 03/03/20 Neck Anterior Stage 3 - Full thickness tissue loss. Subcutaneous fat may be visible but bone, tendon or muscle are NOT exposed. Device related injury under trach tube. (Active)  03/03/20 1805  Location: Neck  Location Orientation: Anterior  Staging: Stage 3 - Full thickness tissue loss. Subcutaneous fat may be visible but bone, tendon or muscle are NOT exposed.  Wound Description (Comments): Device related injury under trach tube.   Present on Admission: No   I have seen and examined this patient myself. I have spent 38 minutes in his evaluation and care.  DVT Prophylaxis: Eliquis  CODE STATUS: Full Code. 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 We have  requested for palliativecare evaluation.plz see note-trying to have discussion with  patient  Per palliative-family had a meeting and that they wish to continue all  aggressive cares, including vent and dialysis. Per palliative care on 6/14 family is not  interested in meeting with them anymore and have no interest in de-escalating  care. Palliative care has signed off.  Family Communication: I spoke with the family on the night that the patient had  respiratory arrest. They have not been back to visit the patient. The mother, brother,  and sister all stated that they wanted  the patient back on the vent. They rescinded  his DNR. They are not present today either. Disposition: Patient is from [place]. Anticipate discharge to LTAC/SNF but due to lack of insurance, placement has been difficult. Social working Sports administrator.  Disposition Plan:  Status is: Inpatient Remains inpatient appropriate because:Unsafe d/c plan Dispo: The patient is from:[facility] Anticipated d/c is to: SNF/LTAC Anticipated d/c date is: Unknown Barriers to discharge: funding for LTAC placement/SNF placement  Sharalyn Lomba, DO [facility] Direct contact: see [email or web address]  7PM-7AM contact night coverage as above 03/23/2020, 5:39 PM  LOS: 28 days

## 2020-03-23 NOTE — Progress Notes (Signed)
[facility] Progress Note  Subjective:   maintained on  trach collar since yesterday--- s/p HD yest- removed 1400-  Tolerated well   Vitals:   03/23/20 0450 03/23/20 0500 03/23/20 0600 03/23/20 0753  BP: 103/62 93/67 (!) 103/57   Pulse:  89 88   Resp: (!) 24 (!) 0 (!) 32   Temp:    97.9 F (36.6 C)  TempSrc:    Oral  SpO2:  100% 100%   Weight:  62.8 kg    Height:        Exam:   In ICU on vent-  More alert and interactive today   no jvd  Chest coarse BS bilat  Cor reg no RG  Abd soft ntnd no ascites, +llq ostomy, +peg tube   Ext mild pitting  LE edema   Nonfocal, +gen'd weakness        Summary: came from [facility] to [facility] after COVID hospitalization which included mech ventilation and trach placement. At [facility] had severe infections/ bacteremia/ PNA and developed AKI felt due to IV gent toxicity requiring initiation of HD 12/16/19. Admitted to ICU [facility] for trach bleed on 5/19. Was getting MWF HD at [facility] prior to admit here.   OP HD: started March 2021 at [facility] as above MWF   Getting HD here 3.5- 4h, UF 0.5- 1.5 L, hep 2000 prn   admit 62.8kg > range 56.9- 64.7kg here    Assessment/ Plan: 1. 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 resp status tenous- back on vent this past weekend-  Now back on TC.  Full code now.  CXR bilat infiltrates, continues on broad-spec IV abx. Concern is for ongoing aspiration-  2. Enterococcus/ MRSE bacteremia HD cath sepsis - TDC removed 5/26, per ID getting 6 wks vanc/ fortaz/ flagyl and meropenam for polymicrobial cath sepsis and sacral osteomyelitis.  3. Sacral osteo/ decub stage IV - as per #2 4. ESRD - on HD since mid March 2021. HD MWF at [facility]. S/P new TDC 6/2 by IR. Next HD planned for Monday to keep MWF.  5. BP/vol - no sig vol excess now by exam, wt's stable around 60-63kg, unable to UF much on HD this past week.  Now with some dep pitting edema-  some UF  yest 6. Hypokalemia-  Running on high K bath-  Will order daily labs and treat as needed    7. AMS - variable, seems overall improved 8. Atrial fib - per primary, on eliquis 2.5 BID 9. Anemia ckd - transfuse prn, Hb remains 9- 10 range. on ESA 10. Hypercalcemia:  w/ low PTH, prob from immobility, corrCa 11.0 > 10.5>10.3, improved. Cont low 2.0 Ca++ bath , PTH 29.  11. SP peg/ divert colostomy  12. DNR/ poor prognosis-  LTACH only option for disposition given needs but apparently not a candidate. Family asked not to be contacted by PCT anymore. PCT have now signed off.  For now cont plan of care.    Louis Meckel  03/23/2020, 8:28 AM   Recent Labs  Lab 03/22/20 0320 03/23/20 0358  K 3.4* 3.6  BUN 98* 45*  CREATININE 2.70* 1.82*  CALCIUM 9.4 9.1  PHOS 3.0 2.1*  HGB 9.3* 9.1*   Inpatient medications: . apixaban  2.5 mg Per Tube BID  . chlorhexidine gluconate (MEDLINE KIT)  15 mL Mouth Rinse BID  . Chlorhexidine Gluconate Cloth  6 each Topical Q0600  . darbepoetin (  ARANESP) injection - DIALYSIS  100 mcg Intravenous Q Mon-HD  . feeding supplement (NEPRO CARB STEADY)  1,000 mL Per Tube Q24H  . feeding supplement (PRO-STAT SUGAR FREE 64)  30 mL Per Tube TID  . insulin aspart  0-6 Units Subcutaneous Q4H  . insulin aspart  2 Units Subcutaneous Q4H  . levothyroxine  25 mcg Per Tube Q0600  . mouth rinse  15 mL Mouth Rinse 10 times per day  . metoprolol tartrate  25 mg Per Tube BID  . metroNIDAZOLE  500 mg Per Tube Q8H  . nutrition supplement (JUVEN)  1 packet Per Tube BID BM  . pantoprazole sodium  40 mg Per Tube Daily  . phenylephrine 200 mcg / ml (NON-ED USE ONLY) injection  100 mcg Intracavernosal Once  . vancomycin  500 mg Intravenous Q M,W,F-HD   . sodium chloride Stopped (03/09/20 1302)  . meropenem (MERREM) IV 500 mg (03/22/20 1746)   Followed by  . [START ON 03/25/2020] cefTAZidime (FORTAZ)  IV     sodium chloride, acetaminophen (TYLENOL) oral liquid 160 mg/5  mL, docusate, fentaNYL (SUBLIMAZE) injection, heparin, ipratropium-albuterol, metoprolol tartrate, polyethylene glycol

## 2020-03-24 LAB — RENAL FUNCTION PANEL
Albumin: 2.1 g/dL — ABNORMAL LOW (ref 3.5–5.0)
Anion gap: 8 (ref 5–15)
BUN: 91 mg/dL — ABNORMAL HIGH (ref 8–23)
CO2: 28 mmol/L (ref 22–32)
Calcium: 9.5 mg/dL (ref 8.9–10.3)
Chloride: 100 mmol/L (ref 98–111)
Creatinine, Ser: 2.66 mg/dL — ABNORMAL HIGH (ref 0.61–1.24)
GFR calc Af Amer: 27 mL/min — ABNORMAL LOW (ref >60)
GFR calc non Af Amer: 23 mL/min — ABNORMAL LOW (ref >60)
Glucose, Bld: 112 mg/dL — ABNORMAL HIGH (ref 70–99)
Phosphorus: 3.3 mg/dL (ref 2.5–4.6)
Potassium: 3.8 mmol/L (ref 3.5–5.1)
Sodium: 136 mmol/L (ref 135–145)

## 2020-03-24 LAB — CBC WITH DIFFERENTIAL/PLATELET
Abs Immature Granulocytes: 0.2 10*3/uL — ABNORMAL HIGH (ref 0.00–0.07)
Basophils Absolute: 0.1 10*3/uL (ref 0.0–0.1)
Basophils Relative: 1 %
Eosinophils Absolute: 0.7 10*3/uL — ABNORMAL HIGH (ref 0.0–0.5)
Eosinophils Relative: 5 %
HCT: 30.9 % — ABNORMAL LOW (ref 39.0–52.0)
Hemoglobin: 9.5 g/dL — ABNORMAL LOW (ref 13.0–17.0)
Immature Granulocytes: 1 %
Lymphocytes Relative: 17 %
Lymphs Abs: 2.6 10*3/uL (ref 0.7–4.0)
MCH: 25.6 pg — ABNORMAL LOW (ref 26.0–34.0)
MCHC: 30.7 g/dL (ref 30.0–36.0)
MCV: 83.3 fL (ref 80.0–100.0)
Monocytes Absolute: 1.7 10*3/uL — ABNORMAL HIGH (ref 0.1–1.0)
Monocytes Relative: 11 %
Neutro Abs: 9.6 10*3/uL — ABNORMAL HIGH (ref 1.7–7.7)
Neutrophils Relative %: 65 %
Platelets: 256 10*3/uL (ref 150–400)
RBC: 3.71 MIL/uL — ABNORMAL LOW (ref 4.22–5.81)
RDW: 18.7 % — ABNORMAL HIGH (ref 11.5–15.5)
WBC: 14.8 10*3/uL — ABNORMAL HIGH (ref 4.0–10.5)
nRBC: 0 % (ref 0.0–0.2)

## 2020-03-24 LAB — GLUCOSE, CAPILLARY
Glucose-Capillary: 106 mg/dL — ABNORMAL HIGH (ref 70–99)
Glucose-Capillary: 118 mg/dL — ABNORMAL HIGH (ref 70–99)
Glucose-Capillary: 119 mg/dL — ABNORMAL HIGH (ref 70–99)
Glucose-Capillary: 130 mg/dL — ABNORMAL HIGH (ref 70–99)
Glucose-Capillary: 131 mg/dL — ABNORMAL HIGH (ref 70–99)
Glucose-Capillary: 133 mg/dL — ABNORMAL HIGH (ref 70–99)
Glucose-Capillary: 140 mg/dL — ABNORMAL HIGH (ref 70–99)

## 2020-03-24 MED ORDER — CHLORHEXIDINE GLUCONATE CLOTH 2 % EX PADS
6.0000 | MEDICATED_PAD | Freq: Every day | CUTANEOUS | Status: DC
  Administered 2020-03-25 – 2020-03-26 (×2): 6 via TOPICAL

## 2020-03-24 NOTE — Progress Notes (Signed)
PROGRESS NOTE  Shane Banks JME:[id number] DOB: [birth year] DOA: 02/14/2020 PCP: [name], MD  Brief History   Patient is a 74 year old male with past medical history of chronic hypoxic respiratory failure status post tracheostomy, ESRD on dialysis, atrial fibrillation, nonhemorrhagic stroke who was admitted from [place] for the evaluation of trach site bleeding. Patient had recent history of Covid pneumonia and was on ventilator because he progressed to ARDS. Because of little improvement in the ventilatory and oxygenation, he underwent tracheostomy and was discharged to [facility]. He had recent history of UTI, multidrug-resistant Pseudomonas pneumonia, stenotrophomonas bacteremia. Because of sepsis and shock, he developed renal failure requiring dialysis. On 5/19, he developed hemoptysis and was transferred to [facility] ED for evaluation. PCCM,ID,Nephrology following here. 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 He also underwent tracheostomy tube change by PCCM for persistent leak. Due to lack of insurance, he cannot go back to [place]. Social worker assisting on placement. Anticipate prolonged hospitalization.  1/8 Admit to [facility] for COVID PNA 2/23 Transfer to [facility] on vent 5/19 Transfer to [facility] for hemoptysis. #6 cuffed trach placed. Back on vent. Bronch performed 5/20 Transitioned to trach collar.  5/22 back on vent full time 5/24 back on antibiotics for fevers 6/1 Initiating PSV weans 6/3 Large cuff leak, trach exchange 6/7 transitioned to [facility] 6/9 remains on TC continuously since 6/7 03/15/2020: Pt returned to ICU for mechanical ventilation and pressors. Pt with sharply decreased saturations while in HD. Patient was requiring pressor support in ICU initially. He is now off of them, but continues to require vent support. Plan is to again wean him to trach collar. 03/21/2020: Pt taken off of vent.  03/22/2020: Pt transferred to  renal floor.  Consultants   Palliative care  Wound care  Interventional Radiology  Infectious Disease  Nephrology  Neurosurgery  ENT  PCCM  Procedures   Mechanical Ventilation  Tracheostomy  Trach exchange  Transition to trach collar  Flexible laryngeal endoscopy - no source of bleeding seen.  Bronchoscopy  Tunnelled catheter placement by IR  HD  Antibiotics   Anti-infectives (From admission, onward)   Start     Dose/Rate Route Frequency Ordered Stop   03/25/20 1800  cefTAZidime (FORTAZ) 1 g in sodium chloride 0.9 % 100 mL IVPB     Discontinue    "Followed by" Linked Group Details   1 g 200 mL/hr over 30 Minutes Intravenous Every 24 hours 03/18/20 1015 04/05/20 1759   03/22/20 0830  vancomycin (VANCOREADY) IVPB 500 mg/100 mL        500 mg 100 mL/hr over 60 Minutes Intravenous  Once 03/22/20 0827 03/22/20 1014   03/20/20 1200  vancomycin (VANCOCIN) IVPB 500 mg/100 ml premix     Discontinue     500 mg 100 mL/hr over 60 Minutes Intravenous Every M-W-F (Hemodialysis) 03/19/20 0941     03/18/20 1800  meropenem (MERREM) 500 mg in sodium chloride 0.9 % 100 mL IVPB     Discontinue    "Followed by" Linked Group Details   500 mg 200 mL/hr over 30 Minutes Intravenous Every 24 hours 03/18/20 1015 03/25/20 1759   03/18/20 1200  vancomycin (VANCOCIN) IVPB 500 mg/100 ml premix        500 mg 100 mL/hr over 60 Minutes Intravenous Every M-W-F (Hemodialysis) 03/18/20 0938 03/18/20 1700   03/18/20 1200  cefTAZidime (FORTAZ) 2 g in sodium chloride 0.9 % 100 mL IVPB  Status:  Discontinued  2 g 200 mL/hr over 30 Minutes Intravenous Every Mon (Hemodialysis) 03/18/20 0939 03/18/20 1015   03/07/20 1800  cefTAZidime (FORTAZ) 2 g in sodium chloride 0.9 % 100 mL IVPB  Status:  Discontinued        2 g 200 mL/hr over 30 Minutes Intravenous Every T-Th-Sa (1800) 03/06/20 1255 03/18/20 1015   03/07/20 1200  vancomycin (VANCOCIN) IVPB 500 mg/100 ml premix  Status:  Discontinued         500 mg 100 mL/hr over 60 Minutes Intravenous Every T-Th-Sa (Hemodialysis) 03/06/20 1255 03/19/20 0941   03/04/20 2000  cefTAZidime (FORTAZ) 2 g in sodium chloride 0.9 % 100 mL IVPB  Status:  Discontinued        2 g 200 mL/hr over 30 Minutes Intravenous Every M-W-F (2000) 03/01/20 1136 03/06/20 1255   03/02/20 1200  vancomycin (VANCOCIN) IVPB 500 mg/100 ml premix        500 mg 100 mL/hr over 60 Minutes Intravenous Every T-Th-Sa (Hemodialysis) 03/01/20 1131 03/02/20 1710   03/01/20 1230  vancomycin (VANCOCIN) IVPB 500 mg/100 ml premix  Status:  Discontinued        500 mg 100 mL/hr over 60 Minutes Intravenous Every M-W-F (Hemodialysis) 03/01/20 1131 03/06/20 1255   02/28/20 1110  vancomycin (VANCOREADY) IVPB 500 mg/100 mL        over 60 Minutes  Continuous PRN 02/28/20 1112 02/28/20 1110   02/28/20 1101  vancomycin (VANCOCIN) 1-5 GM/200ML-% IVPB  Status:  Discontinued       Note to Pharmacy: Lytle Butte   : cabinet override      02/28/20 1101 02/28/20 1327   02/27/20 1800  cefTAZidime (FORTAZ) 1 g in sodium chloride 0.9 % 100 mL IVPB        1 g 200 mL/hr over 30 Minutes Intravenous Every 24 hours 02/27/20 0947 03/03/20 1738   02/27/20 1200  vancomycin (VANCOCIN) IVPB 500 mg/100 ml premix        500 mg 100 mL/hr over 60 Minutes Intravenous Once 02/27/20 0829 02/27/20 1343   02/24/20 1800  vancomycin (VANCOREADY) IVPB 500 mg/100 mL        500 mg 100 mL/hr over 60 Minutes Intravenous  Once 02/24/20 1308 02/24/20 1905   02/22/20 2200  metroNIDAZOLE (FLAGYL) tablet 500 mg     Discontinue     500 mg Per Tube Every 8 hours 02/22/20 0957 04/04/20 2359   02/22/20 1700  cefTRIAXone (ROCEPHIN) 2 g in sodium chloride 0.9 % 100 mL IVPB  Status:  Discontinued        2 g 200 mL/hr over 30 Minutes Intravenous Every 24 hours 02/22/20 0957 02/27/20 0947   02/22/20 1200  vancomycin (VANCOCIN) IVPB 500 mg/100 ml premix  Status:  Discontinued        500 mg 100 mL/hr over 60 Minutes Intravenous Every  T-Th-Sa (Hemodialysis) 02/20/20 1253 02/21/20 1040   02/21/20 1431  vancomycin variable dose per unstable renal function (pharmacist dosing)  Status:  Discontinued         Does not apply See admin instructions 02/21/20 1431 03/01/20 1131   02/21/20 1200  vancomycin (VANCOREADY) IVPB 500 mg/100 mL        500 mg 100 mL/hr over 60 Minutes Intravenous  Once 02/21/20 0956 02/21/20 1253   02/21/20 0714  vancomycin (VANCOCIN) 500-5 MG/100ML-% IVPB  Status:  Discontinued       Note to Pharmacy: Ashley Akin   : cabinet override      02/21/20 0714 02/21/20  1346   02/20/20 1400  vancomycin (VANCOREADY) IVPB 1250 mg/250 mL        1,250 mg 166.7 mL/hr over 90 Minutes Intravenous  Once 02/20/20 1253 02/20/20 1538   02/20/20 1200  levofloxacin (LEVAQUIN) IVPB 500 mg  Status:  Discontinued        500 mg 100 mL/hr over 60 Minutes Intravenous Every 48 hours 02/20/20 1020 02/21/20 0943   02/15/20 1545  vancomycin (VANCOCIN) IVPB 1000 mg/200 mL premix  Status:  Discontinued       "Followed by" Linked Group Details   1,000 mg 200 mL/hr over 60 Minutes Intravenous Every 24 hours 02/14/20 1530 02/14/20 1745   02/15/20 0330  ceFEPIme (MAXIPIME) 2 g in sodium chloride 0.9 % 100 mL IVPB  Status:  Discontinued        2 g 200 mL/hr over 30 Minutes Intravenous Every 12 hours 02/14/20 1531 02/14/20 1531   02/14/20 1531  ceFEPIme (MAXIPIME) 2 g in sodium chloride 0.9 % 100 mL IVPB  Status:  Discontinued        2 g 200 mL/hr over 30 Minutes Intravenous Every 12 hours 02/14/20 1531 02/14/20 1745   02/14/20 1530  ceFEPIme (MAXIPIME) 2 g in sodium chloride 0.9 % 100 mL IVPB  Status:  Discontinued        2 g 200 mL/hr over 30 Minutes Intravenous  Once 02/14/20 1520 02/14/20 1531   02/14/20 1530  vancomycin (VANCOREADY) IVPB 1500 mg/300 mL  Status:  Discontinued       "Followed by" Linked Group Details   1,500 mg 150 mL/hr over 120 Minutes Intravenous  Once 02/14/20 1530 02/14/20 1745        Subjective  The  patient is resting comfortably. He is awake and alert.   Objective   Vitals:  Vitals:   03/24/20 1219 03/24/20 1528  BP: 112/60   Pulse: 80   Resp: (!) 22   Temp: 98 F (36.7 C)   SpO2: 100% 100%   Exam:  Constitutional:   Patient is awake, nonverbal. No acute distress. Neck:   neck appears normal, no masses, normal ROM, supple  no thyromegaly  Trach stable. Respiratory:   No increased work of breathing.  No wheezes, rales, or rhonchi  No tactile fremitus  Coarse breath sounds bilaterally Cardiovascular:   Regular rate and rhythm  No murmurs, ectopy, or gallups.  No lateral PMI. No thrills. Abdomen:   Abdomen is soft, non-tender, non-distended  No hernias, masses, or organomegaly  Normoactive bowel sounds.  Musculoskeletal:   No cyanosis, clubbing  Mild edema Skin:   No rashes, lesions, ulcers  palpation of skin: no induration or nodules Neurologic:   Patient is unable to cooperate with exam. Psychiatric:  Patient is unable to cooperate with exam.  I have personally reviewed the following:   Today's Data   Vitals, CBC, BMP, Glucoses  Micro Data   Blood cultures x 2 (03/03/2020) No growth  Scheduled Meds:  apixaban  2.5 mg Per Tube BID   chlorhexidine  15 mL Mouth Rinse BID   chlorhexidine gluconate (MEDLINE KIT)  15 mL Mouth Rinse BID   Chlorhexidine Gluconate Cloth  6 each Topical Q0600   [START ON 03/25/2020] Chlorhexidine Gluconate Cloth  6 each Topical Q0600   darbepoetin (ARANESP) injection - DIALYSIS  100 mcg Intravenous Q Mon-HD   feeding supplement (NEPRO CARB STEADY)  1,000 mL Per Tube Q24H   feeding supplement (PRO-STAT SUGAR FREE 64)  30 mL Per Tube TID  insulin aspart  0-6 Units Subcutaneous Q4H   insulin aspart  2 Units Subcutaneous Q4H   levothyroxine  25 mcg Per Tube Q0600   mouth rinse  15 mL Mouth Rinse q12n4p   metoprolol tartrate  25 mg Per Tube BID   metroNIDAZOLE  500 mg Per Tube Q8H    nutrition supplement (JUVEN)  1 packet Per Tube BID BM   pantoprazole sodium  40 mg Per Tube Daily   phenylephrine 200 mcg / ml (NON-ED USE ONLY) injection  100 mcg Intracavernosal Once   vancomycin  500 mg Intravenous Q M,W,F-HD   Continuous Infusions:  sodium chloride Stopped (03/09/20 1302)   meropenem (MERREM) IV 500 mg (03/23/20 1749)   Followed by   Derrill Memo ON 03/25/2020] cefTAZidime (FORTAZ)  IV      Principal Problem:   Enterococcal bacteremia Active Problems:   Acute respiratory failure (HCC)   Hemoptysis   Pressure injury of skin   Protein-calorie malnutrition, severe   Decubitus ulcer of sacral region, stage 4 ([place])   Fever   Palliative care by specialist   Goals of care, counseling/discussion   DNR (do not resuscitate)   LOS: 39 days   A & P  Acute on chronic hypoxemic respiratory failure requiring tracheostomy, prolonged ER: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  Status post trach due to respiratory failure from Covid pneumonia.He ison trach collar.PCCM following. Most recently pneumonia has been multidrug-resistant Pseudomonas for which she was treated with Avycaz and mycin nebulizers at [facility]. Possible ongoing aspiration also. Presented with hemoptysis, aspiration of blood. Patient has cuffed trach placed by ENT.No active bleeding on bronchoscopy. He also underwent tracheostomy tube change by PCCM for persistent leak on 03/01/20. Chest x-ray done on 03/03/2020 showed bilateral pleural effusions, stable extensive opacity throughout the left hemithorax. Currently he is on trach collar, requiring 8 L of oxygen per minute. He continues to receive duonebs and CPT. Volume control per HD. The patient was been taken back to HD on the afternoon of 03/15/2020. He was returned to the floor in extremis with saturations in the 70's on NRB. Rapid Response called. Family rescinded DNR and stated that they (Mother, Sister, and brother) that they wished  for him to be ventilated. I apprecate Dr. Jorja Loa help. He has now been disconnected from vent and is on trach collar.  HCAP: Pt with purulent secretions from trach. WBC is elevated as is procalcitonin. Lactic acid is negative. The patient is receiving Vanc, Flagyl, and Merrem.  Sepsis: Resolved. Hypotension, tachycardia, elevated WBC. Assumed to be due to HCAP with worsening Lt>Rt infiltrates on CXR. The patient is receiving Vanc, Flagyl, [name], and Merrem.  CVA: Last night on physical exam the patient appeared to have suffed a catastrophic CVA. Eyes were sharply deviated to the right, and he was unresponsive. Initial CT head was negative for new stroke. Will repeat.  Paroxysmal A. PYP:[id number] rate is controlled. Anticoagulation was discontinued due to suspected GI bleed. He is currently on low-dose Eliquis. Added low dose toprol xl 12.5 mg qd for improved rate control.  Sepsis/bacteremia:Enterococcus and Staphylococcus epidermidis bacteremia HD cath sepsis: TDC removed 5/26. Also has osteomyelitis fifth sacral segment. There is high suspicion for ongoing aspiration. MRI pelvis/sacrum showed osteomyelitis of fifth sacral segment, edema in the muscle around both hips and proximal thighs with possible myositis. ID had recommended treatment for 6 weeks for sacral osteomyelitis and staph epidermidis bacteremia with vancomycin, fortaz and metronidazole. IR consulted for bone biopsy but due to  high risk it was not done. He has persistent leukocytosis. Continue to monitor CBC.  Will repeat blood cultrues. Respiratory cultures from 03/16/2020 have had no growth. Blood cultures x 2 have been repeated. They have had no growth.  ESRD:on HD since mid March 2021. AKI is due to gent toxicity. HD MWF at [facility]. S/P new Tunnel catheter placement on  6/2 by IR. HD today again. Now back to MWFschedule.  Chronic normocytic anemia: Secondary to ESRD, critical illness. Has been transfused with PRBCs.  Currently hemoglobin stable. Continue to monitor. No HD yesterday due to acute drop in saturations.  Unstageable sacral pressure ulcer/osteomyelitis:Wound care following. Discussion as above.  Cachexia/severe protein malnutrition:On tube feeds. Nutrition following.  Mild elevated LFTs:Continue to monitor.  Pressure Injury 02/14/20 Sacrum Medial Stage 4 - Full thickness tissue loss with exposed bone, tendon or muscle. (Active)  02/14/20 1906  Location: Sacrum  Location Orientation: Medial  Staging: Stage 4 - Full thickness tissue loss with exposed bone, tendon or muscle.  Wound Description (Comments):   Present on Admission: Yes    Pressure Injury 03/03/20 Neck Anterior Stage 3 - Full thickness tissue loss. Subcutaneous fat may be visible but bone, tendon or muscle are NOT exposed. Device related injury under trach tube. (Active)  03/03/20 1805  Location: Neck  Location Orientation: Anterior  Staging: Stage 3 - Full thickness tissue loss. Subcutaneous fat may be visible but bone, tendon or muscle are NOT exposed.  Wound Description (Comments): Device related injury under trach tube.   Present on Admission: No   I have seen and examined this patient myself. I have spent 38 minutes in his evaluation and care.  DVT Prophylaxis: Eliquis  CODE STATUS: Full Code. 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 We have  requested for palliativecare evaluation.plz see note-trying to have discussion with  patient  Per palliative-family had a meeting and that they wish to continue all  aggressive cares, including vent and dialysis. Per palliative care on 6/14 family is not  interested in meeting with them anymore and have no interest in de-escalating  care. Palliative care has signed off.  Family Communication: I spoke with the family on the night that the patient had  respiratory arrest. They have not been  back to visit the patient. The mother, brother,  and sister all stated that they wanted the patient back on the vent. They rescinded  his DNR. They are not present today either. Disposition: Patient is from [place]. Anticipate discharge to LTAC/SNF but due to lack of insurance, placement has been difficult. Social working Sports administrator.  Disposition Plan:  Status is: Inpatient Remains inpatient appropriate because:Unsafe d/c plan Dispo: The patient is from:[facility] Anticipated d/c is to: SNF/LTAC Anticipated d/c date is: Unknown Barriers to discharge: funding for LTAC placement/SNF placement  Zamorah Ailes, DO [facility] Direct contact: see [email or web address]  7PM-7AM contact night coverage as above 03/24/2020, 4:57 PM  LOS: 28 days

## 2020-03-24 NOTE — Progress Notes (Signed)
[facility] Progress Note  Subjective:  No major change-  maintained on  trach collar now for several days   Vitals:   03/24/20 0400 03/24/20 0435 03/24/20 0600 03/24/20 0700  BP: 103/62  109/60 (!) 97/56  Pulse: 87  85 90  Resp: (!) 32  (!) 26 (!) 29  Temp:      TempSrc:      SpO2: 98%  100% 100%  Weight:  62.4 kg    Height:        Exam:   In ICU on vent-  More alert and interactive today   no jvd  Chest coarse BS bilat  Cor reg no RG  Abd soft ntnd no ascites, +llq ostomy, +peg tube   Ext mild pitting  LE edema   Nonfocal, +gen'd weakness        Summary: came from [facility] to [facility] after COVID hospitalization which included mech ventilation and trach placement. At [facility] had severe infections/ bacteremia/ PNA and developed AKI felt due to IV gent toxicity requiring initiation of HD 12/16/19. Admitted to ICU [facility] for trach bleed on 5/19. Was getting MWF HD at [facility] prior to admit here.   OP HD: started March 2021 at [facility] as above MWF   Getting HD here 3.5- 4h, UF 0.5- 1.5 L, hep 2000 prn   admit 62.8kg > range 56.9- 64.7kg here    Assessment/ Plan: 1. 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 resp status tenous- back on vent this past weekend-  Now back on TC.  Full code now.  CXR bilat infiltrates, continues on broad-spec IV abx. Concern is for ongoing aspiration-  2. Enterococcus/ MRSE bacteremia HD cath sepsis - TDC removed 5/26, per ID getting 6 wks vanc/ fortaz/ flagyl and meropenam for polymicrobial cath sepsis and sacral osteomyelitis.  3. Sacral osteo/ decub stage IV - as per #2 4. ESRD - on HD since mid March 2021. HD MWF at [facility]. S/P new TDC 6/2 by IR. Next HD planned for Monday to keep MWF.  5. BP/vol - no sig vol excess now by exam, wt's stable around 60-63kg, unable to UF much on HD this past week.  Now with some dep pitting edema-  some UF yest 6. Hypokalemia-  Running on high K bath-   Will order daily labs and treat as needed    7. AMS - variable, seems overall improved 8. Atrial fib - per primary, on eliquis 2.5 BID 9. Anemia ckd - transfuse prn, Hb remains 9- 10 range. on ESA 10. Hypercalcemia:  w/ low PTH, prob from immobility,  improved. Cont low 2.0 Ca++ bath , PTH 29.  11. SP peg/ divert colostomy  12. DNR/ poor prognosis-  LTACH only option for disposition given needs but apparently not a candidate. Family asked not to be contacted by PCT anymore.     Shane Banks  03/24/2020, 7:49 AM   Recent Labs  Lab 03/23/20 0358 03/24/20 0222  K 3.6 3.8  BUN 45* 91*  CREATININE 1.82* 2.66*  CALCIUM 9.1 9.5  PHOS 2.1* 3.3  HGB 9.1* 9.5*   Inpatient medications: . apixaban  2.5 mg Per Tube BID  . chlorhexidine  15 mL Mouth Rinse BID  . chlorhexidine gluconate (MEDLINE KIT)  15 mL Mouth Rinse BID  . Chlorhexidine Gluconate Cloth  6 each Topical Q0600  . darbepoetin (ARANESP) injection - DIALYSIS  100 mcg Intravenous Q Mon-HD  .  feeding supplement (NEPRO CARB STEADY)  1,000 mL Per Tube Q24H  . feeding supplement (PRO-STAT SUGAR FREE 64)  30 mL Per Tube TID  . insulin aspart  0-6 Units Subcutaneous Q4H  . insulin aspart  2 Units Subcutaneous Q4H  . levothyroxine  25 mcg Per Tube Q0600  . mouth rinse  15 mL Mouth Rinse q12n4p  . metoprolol tartrate  25 mg Per Tube BID  . metroNIDAZOLE  500 mg Per Tube Q8H  . nutrition supplement (JUVEN)  1 packet Per Tube BID BM  . pantoprazole sodium  40 mg Per Tube Daily  . phenylephrine 200 mcg / ml (NON-ED USE ONLY) injection  100 mcg Intracavernosal Once  . vancomycin  500 mg Intravenous Q M,W,F-HD   . sodium chloride Stopped (03/09/20 1302)  . meropenem (MERREM) IV 500 mg (03/23/20 1749)   Followed by  . [START ON 03/25/2020] cefTAZidime (FORTAZ)  IV     sodium chloride, acetaminophen (TYLENOL) oral liquid 160 mg/5 mL, docusate, fentaNYL (SUBLIMAZE) injection, heparin, ipratropium-albuterol, metoprolol  tartrate, polyethylene glycol

## 2020-03-25 ENCOUNTER — Inpatient Hospital Stay (HOSPITAL_COMMUNITY): Payer: Medicare HMO

## 2020-03-25 LAB — HEPATIC FUNCTION PANEL
ALT: 16 U/L (ref 0–44)
AST: 70 U/L — ABNORMAL HIGH (ref 15–41)
Albumin: 2.2 g/dL — ABNORMAL LOW (ref 3.5–5.0)
Alkaline Phosphatase: 101 U/L (ref 38–126)
Bilirubin, Direct: 0.5 mg/dL — ABNORMAL HIGH (ref 0.0–0.2)
Indirect Bilirubin: 0.6 mg/dL (ref 0.3–0.9)
Total Bilirubin: 1.1 mg/dL (ref 0.3–1.2)
Total Protein: 7.8 g/dL (ref 6.5–8.1)

## 2020-03-25 LAB — CBC WITH DIFFERENTIAL/PLATELET
Abs Immature Granulocytes: 0.2 10*3/uL — ABNORMAL HIGH (ref 0.00–0.07)
Basophils Absolute: 0.1 10*3/uL (ref 0.0–0.1)
Basophils Relative: 1 %
Eosinophils Absolute: 0.8 10*3/uL — ABNORMAL HIGH (ref 0.0–0.5)
Eosinophils Relative: 5 %
HCT: 31.8 % — ABNORMAL LOW (ref 39.0–52.0)
Hemoglobin: 10.3 g/dL — ABNORMAL LOW (ref 13.0–17.0)
Immature Granulocytes: 1 %
Lymphocytes Relative: 18 %
Lymphs Abs: 2.8 10*3/uL (ref 0.7–4.0)
MCH: 26.3 pg (ref 26.0–34.0)
MCHC: 32.4 g/dL (ref 30.0–36.0)
MCV: 81.3 fL (ref 80.0–100.0)
Monocytes Absolute: 1.4 10*3/uL — ABNORMAL HIGH (ref 0.1–1.0)
Monocytes Relative: 9 %
Neutro Abs: 10.7 10*3/uL — ABNORMAL HIGH (ref 1.7–7.7)
Neutrophils Relative %: 66 %
Platelets: 229 10*3/uL (ref 150–400)
RBC: 3.91 MIL/uL — ABNORMAL LOW (ref 4.22–5.81)
RDW: 18.7 % — ABNORMAL HIGH (ref 11.5–15.5)
WBC: 15.9 10*3/uL — ABNORMAL HIGH (ref 4.0–10.5)
nRBC: 0.1 % (ref 0.0–0.2)

## 2020-03-25 LAB — RENAL FUNCTION PANEL
Albumin: 2.1 g/dL — ABNORMAL LOW (ref 3.5–5.0)
Anion gap: 12 (ref 5–15)
BUN: 137 mg/dL — ABNORMAL HIGH (ref 8–23)
CO2: 24 mmol/L (ref 22–32)
Calcium: 9.8 mg/dL (ref 8.9–10.3)
Chloride: 99 mmol/L (ref 98–111)
Creatinine, Ser: 3.46 mg/dL — ABNORMAL HIGH (ref 0.61–1.24)
GFR calc Af Amer: 19 mL/min — ABNORMAL LOW (ref >60)
GFR calc non Af Amer: 17 mL/min — ABNORMAL LOW (ref >60)
Glucose, Bld: 97 mg/dL (ref 70–99)
Phosphorus: 4.3 mg/dL (ref 2.5–4.6)
Potassium: 4.8 mmol/L (ref 3.5–5.1)
Sodium: 135 mmol/L (ref 135–145)

## 2020-03-25 LAB — CBC
HCT: 28 % — ABNORMAL LOW (ref 39.0–52.0)
Hemoglobin: 8.5 g/dL — ABNORMAL LOW (ref 13.0–17.0)
MCH: 25.1 pg — ABNORMAL LOW (ref 26.0–34.0)
MCHC: 30.4 g/dL (ref 30.0–36.0)
MCV: 82.8 fL (ref 80.0–100.0)
Platelets: 264 10*3/uL (ref 150–400)
RBC: 3.38 MIL/uL — ABNORMAL LOW (ref 4.22–5.81)
RDW: 18.6 % — ABNORMAL HIGH (ref 11.5–15.5)
WBC: 15.9 10*3/uL — ABNORMAL HIGH (ref 4.0–10.5)
nRBC: 0 % (ref 0.0–0.2)

## 2020-03-25 LAB — GLUCOSE, CAPILLARY
Glucose-Capillary: 110 mg/dL — ABNORMAL HIGH (ref 70–99)
Glucose-Capillary: 110 mg/dL — ABNORMAL HIGH (ref 70–99)
Glucose-Capillary: 94 mg/dL (ref 70–99)
Glucose-Capillary: 105 mg/dL — ABNORMAL HIGH (ref 70–99)
Glucose-Capillary: 128 mg/dL — ABNORMAL HIGH (ref 70–99)

## 2020-03-25 MED ORDER — NEPRO/CARBSTEADY PO LIQD
1000.0000 mL | ORAL | Status: DC
  Administered 2020-03-26 – 2020-04-07 (×10): 1000 mL
  Filled 2020-03-25 (×20): qty 1000

## 2020-03-25 MED ORDER — PRO-STAT SUGAR FREE PO LIQD
30.0000 mL | Freq: Two times a day (BID) | ORAL | Status: DC
  Administered 2020-03-25 – 2020-04-08 (×27): 30 mL
  Filled 2020-03-25 (×28): qty 30

## 2020-03-25 MED ORDER — HEPARIN SODIUM (PORCINE) 1000 UNIT/ML DIALYSIS: 20.0000 [IU]/kg | INTRAMUSCULAR | Status: DC | PRN

## 2020-03-25 MED ORDER — HEPARIN SODIUM (PORCINE) 1000 UNIT/ML IJ SOLN
INTRAMUSCULAR | Status: AC
  Administered 2020-03-25: 3800 [IU] via INTRAVENOUS_CENTRAL
  Filled 2020-03-25: qty 4

## 2020-03-25 MED ORDER — DARBEPOETIN ALFA 100 MCG/0.5ML IJ SOSY
PREFILLED_SYRINGE | INTRAMUSCULAR | Status: AC
  Filled 2020-03-25: qty 0.5

## 2020-03-25 NOTE — Progress Notes (Signed)
Pharmacy Antibiotic Note  Shane Banks is a 74 y.o. male admitted on 02/14/2020 with CoNS/E.faecalis bacteremia/sacral osteomyelitis/pneumonia. 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 Line pulled 5/26, line holiday and had it replaced 5/28. TTE negative. Repeat BCx are negative. ID planning 6 weeks of therapy.  HD today  Height: 5\' 9"  (175.3 cm) Weight: 81.2 kg (179 lb 0.2 oz) IBW/kg (Calculated) : 70.7  Temp (24hrs), Avg:98.8 F (37.1 C), Min:98 F (36.7 C), Max:99.4 F (37.4 C)  Recent Labs  Lab 03/21/20 0746 03/22/20 0320 03/23/20 0358 03/24/20 0222 03/25/20 0403  WBC 13.6* 13.1* 14.1* 14.8* 15.9*  CREATININE 2.09* 2.70* 1.82* 2.66* 3.46*  VANCORANDOM  --  15  --   --   --     Estimated Creatinine Clearance: 19.3 mL/min (A) (by C-G formula based on SCr of 3.46 mg/dL (H)).    Allergies  Allergen Reactions  . Aspirin Rash  . Cephalexin Other (See Comments)    dizziness  . Penicillins     Itching,swelling    Plan: Continue Vanc 500mg  IV qHD - scheduled for MWF with HD Fortaz 1 g q24h vanc level mid-week Above to complete course through 7/8  [name], PharmD, BCPS, BCCCP Clinical Pharmacist [phone]  Please check AMION for all [place] numbers  03/25/2020 10:57 AM

## 2020-03-25 NOTE — Progress Notes (Addendum)
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 Patient had recent history of Covid pneumonia and was on ventilator because he progressed to ARDS. Because of little improvement in the ventilatory and oxygenation, he underwent tracheostomy and was discharged to [facility]. He had recent history of UTI, multidrug-resistant Pseudomonas pneumonia, stenotrophomonas bacteremia. Because of sepsis and shock, he developed renal failure requiring dialysis. On 5/19, he developed hemoptysis and was transferred to [facility] ED for evaluation. PCCM,ID,Nephrology following here. 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 He also underwent tracheostomy tube change by PCCM for persistent leak on 6/3 and transition to trach collar on 6/7. 03/15/2020: Pt returned to ICU for mechanical ventilation and pressors. Pt with sharply decreased saturations while in HD. Patient was requiring pressor support in ICU initially. He is now off of them, but continues to require vent support. Plan is to again wean him to trach collar. 03/21/2020: Pt taken off of vent.  03/22/2020: Pt transferred to renal floor. Due to lack of insurance, he cannot go back to [employer]. Social worker working on SNF placement but will need to be on cuffless trach to be accepted.  Assessment/Plan:  1. 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 Patient had acute drop on O2 saturations while in hemodialysis on 6/18 and family  rescinded DNR status prompting transfer to ICU and required mechanical ventilation/pressors.  Now back on trach collar.  Remains full code.  On broad-spectrum IV antibiotics for bilateral infiltrates seen on chest x-ray and upper airway secretions/purulent bronchitis/concern for ongoing aspiration.  2.  Sacral osteomyelitis and HD cath sepsis: TDC removed on 5/26, remains on 6 weeks of IV antibiotics per infectious diseases-vancomycin/Fortaz/Flagyl and meropenem for polymicrobial Sepsis.  Appears to have persistent leukocytosis.  Continue IV antibiotics.  Appreciate infectious diseases evaluation and follow-up.  3. Paroxysmal A. AGT:XMIWOEHOZ rate is controlled. Anticoagulation was discontinued due to suspected GI bleed. He is currently on low-dose Eliquis. Added low dose toprol xl 12.5 mg qd for improved rate control.  Placed on cardiac monitor.  4. 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 S/P new Tunnel catheter placement on 6/2 by IR. Now back to MWFschedule. HD today.  5. Chronic normocytic anemia: Secondary to ESRD, critical illness. Has been transfused with PRBCs.  He also had tracheal site bleeding during the hospital course. Currently hemoglobin stable. Continue to monitor.  6.  History of nonhemorrhagic CVA: Patient had repeat CT head done on 6/18 and concern for lateral gaze/change in mental status.  CT head only showed atrophy and chronic microvascular disease with no acute findings.  On anticoagulation in the setting of A. fib.  7. Unstageable sacral pressure ulcer/osteomyelitis:Wound care following.  Antibiotics as above for osteomyelitis.  8.  Severe protein calorie malnutrition: On tube feeds.  Watch residuals periodically to prevent aspirations.  Dietitian following.  DVT prophylaxis: On oral anticoagulation Code Status: Full code now.  Placed on cardiac monitor Family / Patient Communication: Not present at bedside. Disposition  Plan:   Status is: Inpatient  Remains inpatient appropriate because:Unsafe d/c plan   Dispo: The patient is  from: Home              Anticipated d/c is to: SNF              Anticipated d/c date is: > 3 days              Patient currently is not medically stable to d/c.        Subjective:  Cachectic appearing male, s/p tracheostomy with good cough reflex but significant upper airway secretions noted.  Attempts to communicate by nodding and trying to speak although difficult to understand.  Objective: Vitals:   03/25/20 1600 03/25/20 1616 03/25/20 1736 03/25/20 1740  BP: (!) 93/54 119/63 (!) 104/57   Pulse: 93 95 89 90  Resp: (!) 43 (!) 28 19 (!) 26  Temp:  (!) 97.5 F (36.4 C) 98.6 F (37 C)   TempSrc:  Axillary    SpO2: 99% 100% 99% 98%  Weight:  63.3 kg    Height:        Intake/Output Summary (Last 24 hours) at 03/25/2020 1834 Last data filed at 03/25/2020 1616 Gross per 24 hour  Intake 480 ml  Output 566 ml  Net -86 ml   Filed Weights   03/25/20 0500 03/25/20 1227 03/25/20 1616  Weight: 81.2 kg 63.6 kg 63.3 kg    Physical Examination:  General: Cachectic appearing male, s/p trach with upper airway secretions.  He does attempt to talk and nod to some questions.  No acute distress noted Heart: S1-S2 heard, regular rate and rhythm, no murmurs.  No leg edema noted Lungs: Does have upper airway sounds/rhonchi on exam, no accessory muscle use Abdomen: S/p PEG and diverting colostomy, bowel sounds heard, soft, nontender, nondistended. No organomegaly.  No CVA tenderness Extremities: No pedal edema.  No cyanosis or clubbing. Neurological: Awake alert, nods to some questions but unable to follow specific commands.  Moves extremities spontaneously at times but could not cooperate for full evaluation of strength. skin: stage 4 sacral decubitus  Data Reviewed: I have personally reviewed following labs and imaging studies  CBC: Recent Labs  Lab 03/21/20 0746  03/21/20 0746 03/22/20 0320 03/23/20 0358 03/24/20 0222 03/25/20 0403 03/25/20 1541  WBC 13.6*   < > 13.1* 14.1* 14.8* 15.9* 15.9*  NEUTROABS 9.1*  --  8.4* 9.2* 9.6* 10.7*  --   HGB 9.0*   < > 9.3* 9.1* 9.5* 10.3* 8.5*  HCT 29.1*   < > 30.2* 29.9* 30.9* 31.8* 28.0*  MCV 83.9   < > 82.7 82.8 83.3 81.3 82.8  PLT 207   < > 249 242 256 229 264   < > = values in this interval not displayed.   Basic Metabolic Panel: Recent Labs  Lab 03/18/20 2108 03/20/20 0344 03/21/20 0746 03/22/20 0320 03/23/20 0358 03/24/20 0222 03/25/20 0403  NA  --    < > 135 137 135 136 135  K  --    < > 3.3* 3.4* 3.6 3.8 4.8  CL  --    < > 100 101 99 100 99  CO2  --    < > 25 26 26 28 24  GLUCOSE  --    < > 121* 100* 109* 112* 97  BUN  --    < > 62* 98* 45* 91* 137*  CREATININE  --    < > 2.09* 2.70* 1.82* 2.66* 3.46*  CALCIUM  --    < > 8.9 9.4 9.1 9.5 9.8  MG 1.7  --   --   --   --   --   --     PHOS  --    < > 2.0* 3.0 2.1* 3.3 4.3   < > = values in this interval not displayed.   GFR: Estimated Creatinine Clearance: 17.3 mL/min (A) (by C-G formula based on SCr of 3.46 mg/dL (H)). Liver Function Tests: Recent Labs  Lab 03/22/20 0320 03/23/20 0358 03/24/20 0222 03/25/20 0403 03/25/20 1053  AST  --   --   --   --  70*  ALT  --   --   --   --  16  ALKPHOS  --   --   --   --  101  BILITOT  --   --   --   --  1.1  PROT  --   --   --   --  7.8  ALBUMIN 1.9* 2.2* 2.1* 2.1* 2.2*   No results for input(s): LIPASE, AMYLASE in the last 168 hours. No results for input(s): AMMONIA in the last 168 hours. Coagulation Profile: No results for input(s): INR, PROTIME in the last 168 hours. Cardiac Enzymes: No results for input(s): CKTOTAL, CKMB, CKMBINDEX, TROPONINI in the last 168 hours. BNP (last 3 results) No results for input(s): PROBNP in the last 8760 hours. HbA1C: No results for input(s): HGBA1C in the last 72 hours. CBG: Recent Labs  Lab 03/24/20 1952 03/24/20 2338 03/25/20 0439  03/25/20 0815 03/25/20 1730  GLUCAP 140* 130* 110* 110* 94   Lipid Profile: No results for input(s): CHOL, HDL, LDLCALC, TRIG, CHOLHDL, LDLDIRECT in the last 72 hours. Thyroid Function Tests: No results for input(s): TSH, T4TOTAL, FREET4, T3FREE, THYROIDAB in the last 72 hours. Anemia Panel: No results for input(s): VITAMINB12, FOLATE, FERRITIN, TIBC, IRON, RETICCTPCT in the last 72 hours. Sepsis Labs: Recent Labs  Lab 03/19/20 0201  PROCALCITON 2.06    Recent Results (from the past 240 hour(s))  Culture, respiratory (non-expectorated)     Status: None   Collection Time: 03/16/20  6:02 AM   Specimen: Tracheal Aspirate; Respiratory  Result Value Ref Range Status   Specimen Description TRACHEAL ASPIRATE  Final   Special Requests NONE  Final   Gram Stain   Final    RARE WBC PRESENT, PREDOMINANTLY PMN NO ORGANISMS SEEN Performed at [facility], [place] [address], [place], [place] [address]    Culture MODERATE PSEUDOMONAS AERUGINOSA  Final   Report Status 03/18/2020 FINAL  Final   Organism ID, Bacteria PSEUDOMONAS AERUGINOSA  Final      Susceptibility   Pseudomonas aeruginosa - MIC*    CEFTAZIDIME >=64 RESISTANT Resistant     CIPROFLOXACIN >=4 RESISTANT Resistant     GENTAMICIN 8 INTERMEDIATE Intermediate     IMIPENEM 2 SENSITIVE Sensitive     CEFEPIME >=32 RESISTANT Resistant     * MODERATE PSEUDOMONAS AERUGINOSA  Culture, blood (routine x 2)     Status: None   Collection Time: 03/17/20  8:32 PM   Specimen: BLOOD RIGHT HAND  Result Value Ref Range Status   Specimen Description BLOOD RIGHT HAND  Final   Special Requests   Final    BOTTLES DRAWN AEROBIC ONLY Blood Culture adequate volume   Culture   Final    NO GROWTH 5 DAYS Performed at [facility], [place] [address] [place], [place] [address]    Report Status 03/22/2020 FINAL  Final  Culture, blood (routine x 2)     Status: None   Collection Time: 03/17/20  8:39 PM   Specimen: BLOOD LEFT HAND  Result Value  Ref Range Status   Specimen Description BLOOD LEFT HAND  Final   Special Requests   Final    BOTTLES DRAWN AEROBIC ONLY Blood Culture adequate volume   Culture   Final    NO GROWTH 5 DAYS Performed at [facility], [address], [place], [place] [address]    Report Status 03/22/2020 FINAL  Final      Radiology Studies: No results found.    Scheduled Meds: . apixaban  2.5 mg Per Tube BID  . chlorhexidine  15 mL Mouth Rinse BID  . chlorhexidine gluconate (MEDLINE KIT)  15 mL Mouth Rinse BID  . Chlorhexidine Gluconate Cloth  6 each Topical Q0600  . Chlorhexidine Gluconate Cloth  6 each Topical Q0600  . darbepoetin (ARANESP) injection - DIALYSIS  100 mcg Intravenous Q Mon-HD  . feeding supplement (PRO-STAT SUGAR FREE 64)  30 mL Per Tube BID  . insulin aspart  0-6 Units Subcutaneous Q4H  . insulin aspart  2 Units Subcutaneous Q4H  . levothyroxine  25 mcg Per Tube Q0600  . mouth rinse  15 mL Mouth Rinse q12n4p  . metoprolol tartrate  25 mg Per Tube BID  . metroNIDAZOLE  500 mg Per Tube Q8H  . nutrition supplement (JUVEN)  1 packet Per Tube BID BM  . pantoprazole sodium  40 mg Per Tube Daily  . phenylephrine 200 mcg / ml (NON-ED USE ONLY) injection  100 mcg Intracavernosal Once  . vancomycin  500 mg Intravenous Q M,W,F-HD   Continuous Infusions: . sodium chloride 250 mL (03/24/20 2353)  . cefTAZidime (FORTAZ)  IV    . feeding supplement (NEPRO CARB STEADY) Stopped (03/25/20 1111)         Time spent: 25 minutes    >50% time spent in discussions with care team and coordination of care.    Guilford Shi, MD [facility] Pager in [place]  If 7PM-7AM, please contact night-coverage [email or web address] 03/25/2020, 6:34 PM

## 2020-03-25 NOTE — Progress Notes (Signed)
Nutrition Follow-up  DOCUMENTATION CODES:   Underweight, Severe malnutrition in context of chronic illness  INTERVENTION:   Continue tube feeding via PEG: - Increase Nepro to 50 ml/hr (1200 ml/day) - Pro-stat 30 ml BID  Tube feeding regimen provides 2360 kcal, 127 grams of protein, and 872 ml of H2O.   -Continue Juven BID per tube, each packet provides 80 calories, 8 grams of carbohydrate, 2.5 grams of protein, 7 grams of L-arginine and 7 grams of L-glutamine; supplement contains CaHMB, Vitamins C, E, B12 and zinc to promote wound healing.  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  Ongoing, being addressed via TF  GOAL:   Patient will meet greater than or equal to 90% of their needs  Met via TF  MONITOR:   Diet advancement, Labs, Weight trends, TF tolerance, Skin, I & O's  REASON FOR ASSESSMENT:   Ventilator, Other (hx PEG)    ASSESSMENT:   74 yo male admitted with hemoptysis. 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  6/02 - s/p new [facility] 6/07 - trach collar 6/18 - returned to ICU for increased secretions requiring vent support 6/24 - back on trach collar 6/25 - transferred out of ICU  RD will adjust TF regimen now that pt has been back on TC and has not required mechanical ventilation.  Next HD planned for today.  Spoke with RN who reports pt tolerating TF well. TF off at time of RD visit. Per RN, TF off for ultrasound. Discussed plan to increase TF rate with RN.  Current TF: Nepro @ 40 ml/hr, Pro-stat 30 ml TID  Medications reviewed and include: Aranesp, SSI q 4 hours, Novolog 2 units q 4 hours, Juven BID, protonix, IV abx  Labs reviewed. CBG's: 110-140 x 24 hours  Colostomy: 400 ml x 24 hours  Diet Order:   Diet Order            Diet NPO  time specified  Diet effective now                 EDUCATION NEEDS:   No education needs have been identified at this time  Skin:  Skin Assessment: Skin Integrity Issues: Stage II: head Stage IV: sacrum (11cm x 8cm x 1.5cm)  Last BM:  03/25/20 colostomy  Height:   Ht Readings from Last 1 Encounters:  03/18/20 5' 9"  (1.753 m)    Weight:   Wt Readings from Last 1 Encounters:  03/25/20 81.2 kg    Ideal Body Weight:  72.7 kg  BMI:  Body mass index is 26.44 kg/m.  Estimated Nutritional Needs:   Kcal:  2200-2400  Protein:  110-135 grams  Fluid:  1 L + UOP    Gaynell Face, MS, RD, LDN Inpatient Clinical Dietitian Pager: [phone] Weekend/After Hours: [phone]

## 2020-03-25 NOTE — Progress Notes (Signed)
[facility] Progress Note  Subjective: seen in HD, pt is not responding, eyes slightly open  Vitals:   03/25/20 1500 03/25/20 1530 03/25/20 1600 03/25/20 1616  BP: (!) 89/49 (!) 61/54 (!) 93/54 119/63  Pulse: 92 93 93 95  Resp: (!) 45 (!) 39 (!) 43 (!) 28  Temp:    (!) 97.5 F (36.4 C)  TempSrc:    Axillary  SpO2: 98% 99% 99% 100%  Weight:      Height:        Exam:  seen upstairs on HD, not following commands, eyes open, staring off  no jvd  Chest coarse BS bilat  Cor reg no RG  Abd soft ntnd no ascites, +llq ostomy, +peg tube   Ext mild pitting  LE edema   Nonfocal, +gen'd weakness       Summary:came from [facility] to [facility] after COVID hospitalization which included mech ventilation and trach placement. At [facility] had severe infections/ bacteremia/ PNA and developed AKI felt due to IV gent toxicity requiring initiation of HD 12/16/19. Admitted to ICU [facility] for trach bleed on 5/19. Was getting MWF HD at [facility] prior to admit here.   OP HD: started March 2021 at [facility] as above MWF Getting HD here 3.5- 4h, UF 0.5- 1.5 L, hep 2000 prn admit 62.8kg > range 56.9- 64.7kg here    Assessment/ Plan: 1. 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 resp status tenous- back on vent this past weekend-  Now back on TC.  Full code now.  CXR bilat infiltrates, continues on broad-spec IV abx. Concern is for ongoing aspiration 2. Enterococcus/ MRSE bacteremia HD cath sepsis - TDC removed 5/26, per ID getting 6 wks vanc/ fortaz/ flagyl and meropenam for polymicrobial cath sepsis and sacral osteomyelitis.  3. Sacral osteo/ decub stage IV - as per #2 4. ESRD - on HD since mid March 2021. HD MWF at [facility]. S/P new TDC 6/2 by IR. Plan HD today on schedule.  5. BP/vol - no sig vol excess now by exam, wt's stable around 60-63kg, unable to UF much on HD this past week.  Now with some dep pitting edema-  some UF  yest 6. Hypokalemia-  Running on high K bath-  Will order daily labs and treat as needed 7. AMS - variable, seems overall improved 8. Atrial fib - per primary, on eliquis 2.5 BID 9. Anemia ckd - transfuse prn, Hb remains 9- 10 range. on ESA 10. Hypercalcemia: w/ low PTH, prob from immobility,  improved. Cont low 2.0 Ca++ bath , PTH 29.  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 DNR/ poor prognosis-  LTACH only option for disposition given needs but apparently not a candidate. Family asked not to be contacted by PCT anymore.      Rob Kamika Goodloe 03/25/2020, 4:56 PM   Recent Labs  Lab 03/24/20 0222 03/24/20 0222 03/25/20 0403 03/25/20 1541  K 3.8  --  4.8  --   BUN 91*  --  137*  --   CREATININE 2.66*  --  3.46*  --   CALCIUM 9.5  --  9.8  --   PHOS 3.3  --  4.3  --   HGB 9.5*   < > 10.3* 8.5*   < > = values in this interval not displayed.   Inpatient medications: . apixaban  2.5 mg Per Tube BID  . chlorhexidine  15 mL Mouth Rinse BID  .  chlorhexidine gluconate (MEDLINE KIT)  15 mL Mouth Rinse BID  . Chlorhexidine Gluconate Cloth  6 each Topical Q0600  . Chlorhexidine Gluconate Cloth  6 each Topical Q0600  . darbepoetin (ARANESP) injection - DIALYSIS  100 mcg Intravenous Q Mon-HD  . feeding supplement (PRO-STAT SUGAR FREE 64)  30 mL Per Tube BID  . insulin aspart  0-6 Units Subcutaneous Q4H  . insulin aspart  2 Units Subcutaneous Q4H  . levothyroxine  25 mcg Per Tube Q0600  . mouth rinse  15 mL Mouth Rinse q12n4p  . metoprolol tartrate  25 mg Per Tube BID  . metroNIDAZOLE  500 mg Per Tube Q8H  . nutrition supplement (JUVEN)  1 packet Per Tube BID BM  . pantoprazole sodium  40 mg Per Tube Daily  . phenylephrine 200 mcg / ml (NON-ED USE ONLY) injection  100 mcg Intracavernosal Once  . vancomycin  500 mg Intravenous Q M,W,F-HD   . sodium chloride 250 mL (03/24/20 2353)  . cefTAZidime (FORTAZ)  IV    . feeding supplement (NEPRO CARB STEADY) Stopped (03/25/20 1111)   sodium  chloride, acetaminophen (TYLENOL) oral liquid 160 mg/5 mL, docusate, fentaNYL (SUBLIMAZE) injection, heparin, heparin, ipratropium-albuterol, metoprolol tartrate, polyethylene glycol

## 2020-03-25 NOTE — Progress Notes (Signed)
OT Cancellation Note  Patient Details Name: Shane Banks MRN: [id number] DOB: 08-16-1946   Cancelled Treatment:    Reason Eval/Treat Not Completed: Patient at procedure or test/ unavailable (Pt at dialysis. Will re-attempt session as appropriate)  Layla Maw 03/25/2020, 2:50 PM

## 2020-03-25 NOTE — Progress Notes (Signed)
SLP Cancellation Note  Patient Details Name: Shane Banks MRN: [id number] DOB: [birth year]   Cancelled treatment:        Pt in HD. Will see tomorrow if pt ready for instrumental assessment of swallow.   [name] 03/25/2020, 2:58 PM   Orbie Pyo Colvin Caroli.Ed Actor Pager [phone] Office [phone]  s

## 2020-03-26 ENCOUNTER — Inpatient Hospital Stay (HOSPITAL_COMMUNITY): Payer: Medicare HMO

## 2020-03-26 LAB — CBC WITH DIFFERENTIAL/PLATELET
Abs Immature Granulocytes: 0.19 10*3/uL — ABNORMAL HIGH (ref 0.00–0.07)
Basophils Absolute: 0.1 10*3/uL (ref 0.0–0.1)
Basophils Relative: 1 %
Eosinophils Absolute: 0.6 10*3/uL — ABNORMAL HIGH (ref 0.0–0.5)
Eosinophils Relative: 4 %
HCT: 29.2 % — ABNORMAL LOW (ref 39.0–52.0)
Hemoglobin: 9 g/dL — ABNORMAL LOW (ref 13.0–17.0)
Immature Granulocytes: 1 %
Lymphocytes Relative: 16 %
Lymphs Abs: 2.4 10*3/uL (ref 0.7–4.0)
MCH: 25.3 pg — ABNORMAL LOW (ref 26.0–34.0)
MCHC: 30.8 g/dL (ref 30.0–36.0)
MCV: 82 fL (ref 80.0–100.0)
Monocytes Absolute: 1.3 10*3/uL — ABNORMAL HIGH (ref 0.1–1.0)
Monocytes Relative: 9 %
Neutro Abs: 10.7 10*3/uL — ABNORMAL HIGH (ref 1.7–7.7)
Neutrophils Relative %: 69 %
Platelets: 232 10*3/uL (ref 150–400)
RBC: 3.56 MIL/uL — ABNORMAL LOW (ref 4.22–5.81)
RDW: 18.5 % — ABNORMAL HIGH (ref 11.5–15.5)
WBC: 15.3 10*3/uL — ABNORMAL HIGH (ref 4.0–10.5)
nRBC: 0 % (ref 0.0–0.2)

## 2020-03-26 LAB — RENAL FUNCTION PANEL
Albumin: 1.9 g/dL — ABNORMAL LOW (ref 3.5–5.0)
Anion gap: 8 (ref 5–15)
BUN: 49 mg/dL — ABNORMAL HIGH (ref 8–23)
CO2: 26 mmol/L (ref 22–32)
Calcium: 8.8 mg/dL — ABNORMAL LOW (ref 8.9–10.3)
Chloride: 104 mmol/L (ref 98–111)
Creatinine, Ser: 1.92 mg/dL — ABNORMAL HIGH (ref 0.61–1.24)
GFR calc Af Amer: 39 mL/min — ABNORMAL LOW (ref >60)
GFR calc non Af Amer: 34 mL/min — ABNORMAL LOW (ref >60)
Glucose, Bld: 157 mg/dL — ABNORMAL HIGH (ref 70–99)
Phosphorus: 2.4 mg/dL — ABNORMAL LOW (ref 2.5–4.6)
Potassium: 3.5 mmol/L (ref 3.5–5.1)
Sodium: 138 mmol/L (ref 135–145)

## 2020-03-26 LAB — GLUCOSE, CAPILLARY
Glucose-Capillary: 139 mg/dL — ABNORMAL HIGH (ref 70–99)
Glucose-Capillary: 124 mg/dL — ABNORMAL HIGH (ref 70–99)
Glucose-Capillary: 117 mg/dL — ABNORMAL HIGH (ref 70–99)
Glucose-Capillary: 150 mg/dL — ABNORMAL HIGH (ref 70–99)
Glucose-Capillary: 128 mg/dL — ABNORMAL HIGH (ref 70–99)

## 2020-03-26 MED ORDER — CHLORHEXIDINE GLUCONATE CLOTH 2 % EX PADS
6.0000 | MEDICATED_PAD | Freq: Every day | CUTANEOUS | Status: DC
  Administered 2020-03-27 – 2020-03-28 (×2): 6 via TOPICAL

## 2020-03-26 NOTE — Progress Notes (Signed)
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 Patient had recent history of Covid pneumonia and was on ventilator because he progressed to ARDS. Because of little improvement in the ventilatory and oxygenation, he underwent tracheostomy and was discharged to [facility]. He had recent history of UTI, multidrug-resistant Pseudomonas pneumonia, stenotrophomonas bacteremia. Because of sepsis and shock, he developed renal failure requiring dialysis. On 5/19, he developed hemoptysis and was transferred to [facility] ED for evaluation. PCCM,ID,Nephrology following here. 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 He also underwent tracheostomy tube change by PCCM for persistent leak on 6/3 and transition to trach collar on 6/7. 03/15/2020: Pt returned to ICU for mechanical ventilation and pressors. Pt with sharply decreased saturations while in HD. Patient was requiring pressor support in ICU initially. He is now off of them, but continues to require vent support. Plan is to again wean him to trach collar. 03/21/2020: Pt taken off of vent.  03/22/2020: Pt transferred to renal floor. Due to lack of insurance, he cannot go back to [employer]. Social worker working on SNF placement but will need to be on cuffless trach to be accepted.   Subjective:  More awake, alert today.  Attempts to communicate by nodding and trying to speak although difficult to understand. On 6m cuffed trach /trach collar. SBP in 90s  Objective: Vitals:   03/26/20 0324 03/26/20 0759 03/26/20 0818 03/26/20 1244  BP:  (!) 94/52    Pulse:  71 83 97  Resp:   16  16  Temp:  100.3 F (37.9 C)    TempSrc:      SpO2:  100% 98% 97%  Weight: 67 kg     Height:        Intake/Output Summary (Last 24 hours) at 03/26/2020 1541 Last data filed at 03/26/2020 1409 Gross per 24 hour  Intake 733.33 ml  Output 1016 ml  Net -282.67 ml   Filed Weights   03/25/20 1227 03/25/20 1616 03/26/20 0324  Weight: 63.6 kg 63.3 kg 67 kg    Physical Examination:  General: Appears more awake/alert, improved upper airway secretions, nods to some questions.  No acute distress noted Heart: S1-S2 heard, regular rate and rhythm, no murmurs.  Lungs: improved upper airway sounds/rhonchi on exam today, no accessory muscle use Abdomen: S/p PEG and diverting colostomy, bowel sounds heard, soft, nontender, nondistended. No organomegaly.  No CVA tenderness Extremities: No pedal edema.Heels boots in place Neurological: Awake alert, nods to some questions but unable to follow specific commands.  Unable to move extremities on command-per staff moves at times.  Skin: stage 4 sacral decubitus, left posterior thigh skin breakdown   Data Reviewed: I have personally reviewed following labs and imaging studies  CBC: Recent Labs  Lab 03/22/20 0320 03/22/20 0320 03/23/20 0358 03/24/20 0222 03/25/20 0403 03/25/20 1541 03/26/20 0221  WBC 13.1*   < > 14.1* 14.8* 15.9* 15.9* 15.3*  NEUTROABS 8.4*  --  9.2* 9.6* 10.7*  --  10.7*  HGB 9.3*   < > 9.1* 9.5* 10.3* 8.5* 9.0*  HCT 30.2*   < > 29.9* 30.9* 31.8* 28.0* 29.2*  MCV 82.7   < > 82.8 83.3 81.3 82.8 82.0  PLT 249   < >  242 256 229 264 232   < > = values in this interval not displayed.   Basic Metabolic Panel: Recent Labs  Lab 03/22/20 0320 03/23/20 0358 03/24/20 0222 03/25/20 0403 03/26/20 0221  NA 137 135 136 135 138  K 3.4* 3.6 3.8 4.8 3.5  CL 101 99 100 99 104  CO2 26 26 28 24 26   GLUCOSE 100* 109* 112* 97 157*  BUN 98* 45* 91* 137* 49*  CREATININE 2.70* 1.82* 2.66* 3.46* 1.92*  CALCIUM 9.4 9.1 9.5 9.8 8.8*  PHOS  3.0 2.1* 3.3 4.3 2.4*   GFR: Estimated Creatinine Clearance: 33 mL/min (A) (by C-G formula based on SCr of 1.92 mg/dL (H)). Liver Function Tests: Recent Labs  Lab 03/23/20 0358 03/24/20 0222 03/25/20 0403 03/25/20 1053 03/26/20 0221  AST  --   --   --  70*  --   ALT  --   --   --  16  --   ALKPHOS  --   --   --  101  --   BILITOT  --   --   --  1.1  --   PROT  --   --   --  7.8  --   ALBUMIN 2.2* 2.1* 2.1* 2.2* 1.9*   No results for input(s): LIPASE, AMYLASE in the last 168 hours. No results for input(s): AMMONIA in the last 168 hours. Coagulation Profile: No results for input(s): INR, PROTIME in the last 168 hours. Cardiac Enzymes: No results for input(s): CKTOTAL, CKMB, CKMBINDEX, TROPONINI in the last 168 hours. BNP (last 3 results) No results for input(s): PROBNP in the last 8760 hours. HbA1C: No results for input(s): HGBA1C in the last 72 hours. CBG: Recent Labs  Lab 03/25/20 2128 03/25/20 2310 03/26/20 0311 03/26/20 0759 03/26/20 1244  GLUCAP 105* 128* 139* 124* 117*   Lipid Profile: No results for input(s): CHOL, HDL, LDLCALC, TRIG, CHOLHDL, LDLDIRECT in the last 72 hours. Thyroid Function Tests: No results for input(s): TSH, T4TOTAL, FREET4, T3FREE, THYROIDAB in the last 72 hours. Anemia Panel: No results for input(s): VITAMINB12, FOLATE, FERRITIN, TIBC, IRON, RETICCTPCT in the last 72 hours. Sepsis Labs: No results for input(s): PROCALCITON, LATICACIDVEN in the last 168 hours.  Recent Results (from the past 240 hour(s))  Culture, blood (routine x 2)     Status: None   Collection Time: 03/17/20  8:32 PM   Specimen: BLOOD RIGHT HAND  Result Value Ref Range Status   Specimen Description BLOOD RIGHT HAND  Final   Special Requests   Final    BOTTLES DRAWN AEROBIC ONLY Blood Culture adequate volume   Culture   Final    NO GROWTH 5 DAYS Performed at [facility], [address], [place], [place] [address]    Report Status 03/22/2020 FINAL  Final    Culture, blood (routine x 2)     Status: None   Collection Time: 03/17/20  8:39 PM   Specimen: BLOOD LEFT HAND  Result Value Ref Range Status   Specimen Description BLOOD LEFT HAND  Final   Special Requests   Final    BOTTLES DRAWN AEROBIC ONLY Blood Culture adequate volume   Culture   Final    NO GROWTH 5 DAYS Performed at [facility], [place] [address], [place], [place] [address]    Report Status 03/22/2020 FINAL  Final      Radiology Studies: DG Swallowing Func-Speech Pathology  Result Date: 03/26/2020 Objective Swallowing Evaluation: Type of Study:  MBS-Modified Barium Swallow Study  Patient Details Name: Shane Banks MRN: [id number] Date of Birth: 03/21/1946 Today's Date: 03/26/2020 Time: SLP Start Time (ACUTE ONLY): 41 -SLP Stop Time (ACUTE ONLY): 1209 SLP Time Calculation (min) (ACUTE ONLY): 39 min Past Medical History: Past Medical History: Diagnosis Date  Acute on chronic respiratory failure with hypoxia (HCC)   Atrial fibrillation with RVR (HCC)   BPH (benign prostatic hyperplasia)   COVID-19 virus infection   Pneumonia due to COVID-19 virus   Severe sepsis ([place])   Stroke [facility])  Past Surgical History: Past Surgical History: Procedure Laterality Date  IR FLUORO GUIDE CV LINE RIGHT  12/15/2019  IR FLUORO GUIDE CV LINE RIGHT  02/28/2020  IR THORACENTESIS ASP PLEURAL SPACE W/IMG GUIDE  12/21/2019  IR US GUIDE VASC ACCESS RIGHT  12/15/2019  IR US GUIDE VASC ACCESS RIGHT  02/28/2020 HPI: 74 yo m with PMHx of afib with RVR, BPH, stroke, and COVID ARDS in January requiring trach and LTACH placement at [facility]. Had been tolerating trach collar, but on 5/19 developed hemoptysis and hypoxemic respiratory failure requiring vent. Transferred to [facility] on 5/19. PMSV desired for discussion with pt regarding goals of care.  No data recorded Assessment / Plan / Recommendation CHL IP CLINICAL IMPRESSIONS 03/26/2020 Clinical Impression Pt participated in first instrumental swallow  assessment during prolonged hospitalization since Jan 2021 wearing PMV. Oral impairments include reduced lingual movement and inefficient propulsion leaving lingual residue and piecemeal swallows. During the swallow he is able to achieve adequate laryngeal closure to protect trachea during the swallow but reduced laryngeal elevation leads to vallecular and pyriform sinus residue. During additional cued or spontaneous swallow pyriform sinus residue is penetration silently onto vocal cords, aspirated during the swallow briefly coming to rest on cords without pt sensation. Cued throat clears achieved but poor respiratory support prevents effective cough. As his endurance and diaphragmatic strength improve, swallow should as well. Continue NPO status and ST.   SLP Visit Diagnosis Dysphagia, oropharyngeal phase (R13.12) Attention and concentration deficit following -- Frontal lobe and executive function deficit following -- Impact on safety and function Severe aspiration risk   CHL IP TREATMENT RECOMMENDATION 03/26/2020 Treatment Recommendations Therapy as outlined in treatment plan below   Prognosis 03/26/2020 Prognosis for Safe Diet Advancement Fair Barriers to Reach Goals Severity of deficits;Time post onset Barriers/Prognosis Comment -- CHL IP DIET RECOMMENDATION 03/26/2020 SLP Diet Recommendations NPO Liquid Administration via -- Medication Administration Via alternative means Compensations -- Postural Changes --   CHL IP OTHER RECOMMENDATIONS 03/26/2020 Recommended Consults -- Oral Care Recommendations Oral care QID Other Recommendations --   CHL IP FOLLOW UP RECOMMENDATIONS 03/26/2020 Follow up Recommendations LTACH   CHL IP FREQUENCY AND DURATION 03/26/2020 Speech Therapy Frequency (ACUTE ONLY) min 2x/week Treatment Duration 2 weeks      CHL IP ORAL PHASE 03/26/2020 Oral Phase Impaired Oral - Pudding Teaspoon -- Oral - Pudding Cup -- Oral - Honey Teaspoon Delayed oral transit;Decreased bolus cohesion;Lingual/palatal  residue;Reduced posterior propulsion;Weak lingual manipulation Oral - Honey Cup -- Oral - Nectar Teaspoon Delayed oral transit;Decreased bolus cohesion;Lingual/palatal residue;Reduced posterior propulsion;Weak lingual manipulation Oral - Nectar Cup -- Oral - Nectar Straw -- Oral - Thin Teaspoon -- Oral - Thin Cup -- Oral - Thin Straw -- Oral - Puree -- Oral - Mech Soft -- Oral - Regular -- Oral - Multi-Consistency -- Oral - Pill -- Oral Phase - Comment --  CHL IP PHARYNGEAL PHASE 03/26/2020 Pharyngeal Phase Impaired Pharyngeal- Pudding Teaspoon -- Pharyngeal -- Pharyngeal-  Pudding Cup -- Pharyngeal -- Pharyngeal- Honey Teaspoon Pharyngeal residue - valleculae;Penetration/Aspiration during swallow;Pharyngeal residue - pyriform Pharyngeal Material enters airway, remains ABOVE vocal cords and not ejected out Pharyngeal- Honey Cup -- Pharyngeal -- Pharyngeal- Nectar Teaspoon Pharyngeal residue - pyriform;Pharyngeal residue - valleculae;Penetration/Aspiration during swallow Pharyngeal Material enters airway, CONTACTS cords and not ejected out;Material enters airway, passes BELOW cords then ejected out Pharyngeal- Nectar Cup -- Pharyngeal -- Pharyngeal- Nectar Straw -- Pharyngeal -- Pharyngeal- Thin Teaspoon -- Pharyngeal -- Pharyngeal- Thin Cup -- Pharyngeal -- Pharyngeal- Thin Straw -- Pharyngeal -- Pharyngeal- Puree -- Pharyngeal -- Pharyngeal- Mechanical Soft -- Pharyngeal -- Pharyngeal- Regular -- Pharyngeal -- Pharyngeal- Multi-consistency -- Pharyngeal -- Pharyngeal- Pill -- Pharyngeal -- Pharyngeal Comment --  CHL IP CERVICAL ESOPHAGEAL PHASE 03/26/2020 Cervical Esophageal Phase Impaired Pudding Teaspoon -- Pudding Cup -- Honey Teaspoon -- Honey Cup -- Nectar Teaspoon -- Nectar Cup -- Nectar Straw -- Thin Teaspoon -- Thin Cup -- Thin Straw -- Puree -- Mechanical Soft -- Regular -- Multi-consistency -- Pill -- Cervical Esophageal Comment -- [name] 03/26/2020, 2:14 PM Orbie Pyo Litaker M.Ed Glass blower/designer Pager [phone] Office [phone]              US Abdomen Limited RUQ  Result Date: 03/25/2020 CLINICAL DATA:  Sepsis EXAM: ULTRASOUND ABDOMEN LIMITED RIGHT UPPER QUADRANT COMPARISON:  CT 12/21/2019 FINDINGS: Gallbladder: No gallstones or wall thickening visualized. No sonographic Murphy sign noted by sonographer. Common bile duct: Diameter: 4.5 mm Liver: No focal lesion identified. Within normal limits in parenchymal echogenicity. Portal vein is patent on color Doppler imaging with normal direction of blood flow towards the liver. Other: Right kidney is echogenic. IMPRESSION: 1. Negative for gallstones.  No focal hepatic abnormality. 2. Echogenic right kidney consistent with medical renal disease. Electronically Signed   By: Donavan Foil M.D.   On: 03/25/2020 20:00      Scheduled Meds:  apixaban  2.5 mg Per Tube BID   chlorhexidine  15 mL Mouth Rinse BID   chlorhexidine gluconate (MEDLINE KIT)  15 mL Mouth Rinse BID   [START ON 03/27/2020] Chlorhexidine Gluconate Cloth  6 each Topical Q0600   darbepoetin (ARANESP) injection - DIALYSIS  100 mcg Intravenous Q Mon-HD   feeding supplement (PRO-STAT SUGAR FREE 64)  30 mL Per Tube BID   insulin aspart  0-6 Units Subcutaneous Q4H   insulin aspart  2 Units Subcutaneous Q4H   levothyroxine  25 mcg Per Tube Q0600   mouth rinse  15 mL Mouth Rinse q12n4p   metoprolol tartrate  25 mg Per Tube BID   metroNIDAZOLE  500 mg Per Tube Q8H   nutrition supplement (JUVEN)  1 packet Per Tube BID BM   pantoprazole sodium  40 mg Per Tube Daily   phenylephrine 200 mcg / ml (NON-ED USE ONLY) injection  100 mcg Intracavernosal Once   vancomycin  500 mg Intravenous Q M,W,F-HD   Continuous Infusions:  sodium chloride 250 mL (03/24/20 2353)   cefTAZidime (FORTAZ)  IV 1 g (03/25/20 1901)   feeding supplement (NEPRO CARB STEADY) 50 mL/hr at 03/25/20 2132     Assessment/Plan:  1. 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 Patient had acute drop on O2 saturations while in hemodialysis on 6/18 and family rescinded DNR status prompting transfer to ICU and required mechanical ventilation/pressors.  Now back on trach collar with 5lits 02, PCCM was managing  vent-requested Dr Gala Murdoch to f/u and advise regarding transition to cuffless trach.  Remains full code.  On broad-spectrum IV antibiotics for bilateral infiltrates seen on chest x-ray and upper airway secretions/purulent bronchitis/concern for ongoing aspiration.  2.  Sacral osteomyelitis and HD cath sepsis: TDC removed on 5/26, remains on 6 weeks of IV antibiotics per infectious diseases-vancomycin TIW with dialysis/Fortaz/ po Flagyl and meropenem for polymicrobial Sepsis.  Appears to have persistent leukocytosis.  Continue IV antibiotics.  Appreciate infectious diseases evaluation and follow-up.  3. Paroxysmal A. ELF:YBOFBPZWC rate is controlled. Anticoagulation was discontinued due to suspected GI bleed. He is currently on low-dose Eliquis. Added low dose toprol xl 12.5 mg qd for improved rate control.  Placed on cardiac monitor.  4. 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 S/P new Tunnel catheter placement on 6/2 by IR.Appreciate renal f/u-next HD in am .   5. Chronic normocytic anemia: Secondary to ESRD, critical illness. Has been transfused with PRBCs.  He also had tracheal site bleeding during the hospital course. Currently hemoglobin stable. Continue to monitor.  6.  History of nonhemorrhagic CVA: Patient had repeat CT head done on 6/18 and concern for lateral gaze/change in mental status.  CT head only showed atrophy and chronic microvascular disease with no acute findings.  On anticoagulation in the setting of A. fib.  7. Unstageable sacral pressure  ulcer/osteomyelitis:Wound care following.  Antibiotics as above for osteomyelitis.  8.  Severe protein calorie malnutrition: On tube feeds.  Watch residuals periodically to prevent aspirations.  Dietitian following.  DVT prophylaxis: On oral anticoagulation Code Status: Full code now.  Placed on cardiac monitor. Needs palliative care f/u regarding care goals.  Family / Patient Communication: Not present at bedside. Disposition Plan:   Status is: Inpatient  Remains inpatient appropriate because:Unsafe d/c plan   Dispo: The patient is from: Home              Anticipated d/c is to: SNF              Anticipated d/c date is: > 3 days              Patient currently is not medically stable to d/c-needs to be  transitioned to cuffless trach per SW. D/w PCCM Dr Rodrigo Ran arrange to change to cuff less 6 shiley           Time spent: 25 minutes    >50% time spent in discussions with care team and coordination of care.    [place]ilford Shi, MD [facility] Pager in [place]  If 7PM-7AM, please contact night-coverage [email or web address] 03/26/2020, 3:41 PM

## 2020-03-26 NOTE — Progress Notes (Signed)
Modified Barium Swallow Progress Note  Patient Details  Name: Shane Banks MRN: [id number] Date of Birth: 11/11/1945  Today's Date: 03/26/2020  Modified Barium Swallow completed.  Full report located under Chart Review in the Imaging Section.  Brief recommendations include the following:  Clinical Impression  Pt participated in first instrumental swallow assessment during prolonged hospitalization since Jan 2021 wearing PMV. Oral impairments include reduced lingual movement and inefficient propulsion leaving lingual residue and piecemeal swallows. During the swallow he is able to achieve adequate laryngeal closure to protect trachea during the swallow but reduced laryngeal elevation leads to vallecular and pyriform sinus residue. During additional cued or spontaneous swallow pyriform sinus residue is penetration silently onto vocal cords, aspirated during the swallow briefly coming to rest on cords without pt sensation. Cued throat clears achieved but poor respiratory support prevents effective cough. As his endurance and diaphragmatic strength improve, swallow should as well. Continue NPO status and ST.     Swallow Evaluation Recommendations       SLP Diet Recommendations: NPO       Medication Administration: Via alternative means               Oral Care Recommendations: Oral care QID        [name] 03/26/2020,2:16 PM  [name].Ed Risk analyst [phone] Office [phone]

## 2020-03-26 NOTE — TOC Progression Note (Addendum)
Transition of Care [facility]) - Progression Note    Patient Details  Name: Shane Banks MRN: [id number] Date of Birth: 1945/10/18  Transition of Care [facility]) CM/SW Contact  Carles Collet, RN Phone Number: 03/26/2020, 2:02 PM  Clinical Narrative:    Barriers to SNF DC remain:  1.Cuffed trach, will need to be cuffless, patient will also need to be capped and not require suctioning.   Patient is medically stable at this time to be progressed to cuffless trach per MD. Will continue to work towards capping and managing secretions.  2. SNFs require patients to sit up in recliner for HD  Please consider transfering to HD and dialize in recliner. Please document how long patient tolerates HD in recliner.     on HD since mid March 2021. HD MWF at [facility]    Expected Discharge Plan: Long Term Acute Care (LTAC) Barriers to Discharge: Continued Medical Work up, SNF Pending bed offer  Expected Discharge Plan and Services Expected Discharge Plan: [place] (LTAC)       Living arrangements for the past 2 months: Post-Acute Facility                                       Social Determinants of Health (SDOH) Interventions    Readmission Risk Interventions No flowsheet data found.

## 2020-03-26 NOTE — Progress Notes (Signed)
PT Cancellation Note  Patient Details Name: Shane Banks MRN: [id number] DOB: 1946/01/13   Cancelled Treatment:    Reason Eval/Treat Not Completed: Patient at procedure or test/unavailable - pt going for swallow study, will check back as PT schedule allows.  [name] Pager [phone]  Office [phone]    Roxine Caddy D Elonda Husky 03/26/2020, 10:05 AM

## 2020-03-26 NOTE — Progress Notes (Signed)
  Speech Language Pathology Treatment: Dysphagia  Patient Details Name: Shane Banks MRN: [id number] DOB: 1945/12/30 Today's Date: 03/26/2020 Time: 0347-4259 SLP Time Calculation (min) (ACUTE ONLY): 15 min  Assessment / Plan / Recommendation Clinical Impression  Mr Shew is making great progress towards goals. He initiates swallows with mild oral delays of thin via spoon, jello and ice after PMV donned and oral care completed. Minimal signs of aspiration in pt who has has prolonged and complicated hospital course and swallow integrity could be affected by deconditioning, pressures for swallow with PMV and decreased diaphragmatic function for adequate respiratory support. MBS scheduled today at 11:30 for possible initiation of po's.    HPI HPI: 74 yo m with PMHx of afib with RVR, BPH, stroke, and COVID ARDS in January requiring trach and LTACH placement at [facility]. Had been tolerating trach collar, but on 5/19 developed hemoptysis and hypoxemic respiratory failure requiring vent. Transferred to [facility] on 5/19. PMSV desired for discussion with pt regarding goals of care.      SLP Plan  MBS       Recommendations  Diet recommendations: NPO Medication Administration: Via alternative means      Patient may use Passy-Muir Speech Valve: During all therapies with supervision PMSV Supervision: Full MD: Please consider changing trach tube to : Smaller size;Cuffless         Oral Care Recommendations: Oral care QID Follow up Recommendations: LTACH Plan: MBS                      [name] 03/26/2020, 10:02 AM  Orbie Pyo Colvin Caroli.Ed Risk analyst [phone] Office [phone]

## 2020-03-26 NOTE — Progress Notes (Signed)
Received an order to change patient's trach to a #6 cuffless shiley. Secretary was made aware to order 2 #6 XLT  cuffless distal trachs. Will change patient's trach tomorrow if the trach is available.

## 2020-03-27 ENCOUNTER — Encounter (HOSPITAL_COMMUNITY): Payer: Self-pay | Admitting: Internal Medicine

## 2020-03-27 ENCOUNTER — Other Ambulatory Visit: Payer: Self-pay

## 2020-03-27 LAB — POTASSIUM: Potassium: 3.5 mmol/L (ref 3.5–5.1)

## 2020-03-27 LAB — RENAL FUNCTION PANEL
Albumin: 1.9 g/dL — ABNORMAL LOW (ref 3.5–5.0)
Anion gap: 8 (ref 5–15)
BUN: 96 mg/dL — ABNORMAL HIGH (ref 8–23)
CO2: 27 mmol/L (ref 22–32)
Calcium: 9.3 mg/dL (ref 8.9–10.3)
Chloride: 104 mmol/L (ref 98–111)
Creatinine, Ser: 2.85 mg/dL — ABNORMAL HIGH (ref 0.61–1.24)
GFR calc Af Amer: 24 mL/min — ABNORMAL LOW (ref >60)
GFR calc non Af Amer: 21 mL/min — ABNORMAL LOW (ref >60)
Glucose, Bld: 130 mg/dL — ABNORMAL HIGH (ref 70–99)
Phosphorus: 3.3 mg/dL (ref 2.5–4.6)
Potassium: 3.6 mmol/L (ref 3.5–5.1)
Sodium: 139 mmol/L (ref 135–145)

## 2020-03-27 LAB — CBC WITH DIFFERENTIAL/PLATELET
Abs Immature Granulocytes: 0.15 10*3/uL — ABNORMAL HIGH (ref 0.00–0.07)
Basophils Absolute: 0.1 10*3/uL (ref 0.0–0.1)
Basophils Relative: 0 %
Eosinophils Absolute: 0.8 10*3/uL — ABNORMAL HIGH (ref 0.0–0.5)
Eosinophils Relative: 5 %
HCT: 30.9 % — ABNORMAL LOW (ref 39.0–52.0)
Hemoglobin: 9.6 g/dL — ABNORMAL LOW (ref 13.0–17.0)
Immature Granulocytes: 1 %
Lymphocytes Relative: 18 %
Lymphs Abs: 3.1 10*3/uL (ref 0.7–4.0)
MCH: 25.4 pg — ABNORMAL LOW (ref 26.0–34.0)
MCHC: 31.1 g/dL (ref 30.0–36.0)
MCV: 81.7 fL (ref 80.0–100.0)
Monocytes Absolute: 1.6 10*3/uL — ABNORMAL HIGH (ref 0.1–1.0)
Monocytes Relative: 10 %
Neutro Abs: 10.9 10*3/uL — ABNORMAL HIGH (ref 1.7–7.7)
Neutrophils Relative %: 66 %
Platelets: 273 10*3/uL (ref 150–400)
RBC: 3.78 MIL/uL — ABNORMAL LOW (ref 4.22–5.81)
RDW: 18.3 % — ABNORMAL HIGH (ref 11.5–15.5)
WBC: 16.7 10*3/uL — ABNORMAL HIGH (ref 4.0–10.5)
nRBC: 0 % (ref 0.0–0.2)

## 2020-03-27 LAB — GLUCOSE, CAPILLARY
Glucose-Capillary: 128 mg/dL — ABNORMAL HIGH (ref 70–99)
Glucose-Capillary: 132 mg/dL — ABNORMAL HIGH (ref 70–99)
Glucose-Capillary: 154 mg/dL — ABNORMAL HIGH (ref 70–99)
Glucose-Capillary: 149 mg/dL — ABNORMAL HIGH (ref 70–99)
Glucose-Capillary: 125 mg/dL — ABNORMAL HIGH (ref 70–99)

## 2020-03-27 LAB — MAGNESIUM: Magnesium: 1.7 mg/dL (ref 1.7–2.4)

## 2020-03-27 LAB — MRSA PCR SCREENING: MRSA by PCR: NEGATIVE

## 2020-03-27 MED ORDER — HEPARIN SODIUM (PORCINE) 1000 UNIT/ML IJ SOLN
INTRAMUSCULAR | Status: AC
  Administered 2020-03-27: 2000 [IU] via INTRAVENOUS_CENTRAL
  Filled 2020-03-27: qty 2

## 2020-03-27 MED ORDER — VANCOMYCIN HCL IN DEXTROSE 500-5 MG/100ML-% IV SOLN
INTRAVENOUS | Status: AC
  Administered 2020-03-27: 500 mg via INTRAVENOUS
  Filled 2020-03-27: qty 100

## 2020-03-27 MED ORDER — HEPARIN SODIUM (PORCINE) 1000 UNIT/ML IJ SOLN
INTRAMUSCULAR | Status: AC
  Administered 2020-03-27: 1000 [IU]/h via INTRAVENOUS_CENTRAL
  Filled 2020-03-27: qty 4

## 2020-03-27 NOTE — Progress Notes (Signed)
Physical Therapy Treatment Patient Details Name: Shane Banks MRN: [id number] DOB: 1945-12-16 Today's Date: 03/27/2020    History of Present Illness 74 yo m with PMHx of afib with RVR, BPH, stroke, and COVID ARDS in January requiring trach and LTACH placement at [facility]. Had been tolerating trach collar, but on 5/19 developed hemoptysis and hypoxemic respiratory failure requiring vent. Transferred to [facility] on 5/19.  5/19 back on vent, 6/7 transitioned to [facility]    PT Comments    Pt continues to require total +2 assist for bed mobility, and requires max assist to sit EOB with feet and UEs propped. Pt demonstrates no posterior righting reaction, but truncal and RUE engagement noted with lateral leaning towards R. Pt with periods of lethargy sitting EOB, BP 80s/50s with recovery to 90s/60s with continued sitting. Pt with significant secretions throughout session, assisted with oral suction and RN notified of potential need for deep suction. Current d/c plan remains appropriate, will continue to follow acutely.    Follow Up Recommendations  SNF;Supervision/Assistance - 24 hour (vs LTACH)     Equipment Recommendations  Other (comment) (defer to next venue)    Recommendations for Other Services       Precautions / Restrictions Precautions Precautions: Fall;Other (comment) Precaution Comments: trach collar, PEG Restrictions Weight Bearing Restrictions: No    Mobility  Bed Mobility Overal bed mobility: Needs Assistance Bed Mobility: Supine to Sit;Sit to Supine     Supine to sit: Total assist;+2 for physical assistance Sit to supine: Total assist;+2 for physical assistance   General bed mobility comments: total assist +2 with pt contributing 0%, assist for truncal and LE management, scooting to EOB with use of bed bads.  Transfers Overall transfer level: Needs assistance               General transfer comment: deferred   Ambulation/Gait                  Stairs             Wheelchair Mobility    Modified Rankin (Stroke Patients Only)       Balance Overall balance assessment: Needs assistance Sitting-balance support: Feet unsupported;No upper extremity supported Sitting balance-Leahy Scale: Zero Sitting balance - Comments: Pt requires max assist to maintain seated balance; no righting reaction posteriorly but mild reaction with R lateral leaning. Pt sat EOB multiple minutes with posterior truncal assist with cues for truncal engagement throughout, OT assisting pt in grooming tasks       Standing balance comment: unable to attempt today                            Cognition Arousal/Alertness: Awake/alert;Lethargic Behavior During Therapy: Flat affect Overall Cognitive Status: Difficult to assess                                 General Comments: pt slow to respond, frequently shakes head "no" so unsure of reliability of answers. Later noted to nod/mouth "yes"      Exercises General Exercises - Upper Extremity Shoulder Flexion: PROM;Both;5 reps;Supine Shoulder ABduction: PROM;Both;5 reps;Supine Elbow Flexion: PROM;5 reps;Supine Elbow Extension: PROM;Both;5 reps;Supine Wrist Flexion: PROM;Both;5 reps;Supine Wrist Extension: PROM;Both;5 reps;Supine Composite Extension: PROM;Both;5 reps;Supine General Exercises - Lower Extremity Heel Slides: Both;10 reps;Supine;PROM Hip ABduction/ADduction: Both;10 reps;Supine;PROM Other Exercises Other Exercises: PROM in prep for splint wear. Only able to don L  splint today as pt appeared with too much pain while attempting to don R splint    General Comments General comments (skin integrity, edema, etc.): EOB BP and HR 87/55 and 92 bpm respectively, with pt appearing lethargic 2 minutes later, BP 96/60      Pertinent Vitals/Pain Pain Assessment: Faces Faces Pain Scale: Hurts little more Pain Location: generalized during ROM, mobility Pain Descriptors  / Indicators: Discomfort;Grimacing;Guarding Pain Intervention(s): Limited activity within patient's tolerance;Monitored during session;Repositioned    Home Living                      Prior Function            PT Goals (current goals can now be found in the care plan section) Acute Rehab PT Goals Patient Stated Goal: not stated PT Goal Formulation: Patient unable to participate in goal setting Time For Goal Achievement: 04/03/20 Potential to Achieve Goals: Fair Progress towards PT goals: Not progressing toward goals - comment (total care)    Frequency    Min 2X/week      PT Plan Current plan remains appropriate    Co-evaluation PT/OT/SLP Co-Evaluation/Treatment: Yes Reason for Co-Treatment: Complexity of the patient's impairments (multi-system involvement);For patient/therapist safety   OT goals addressed during session: Strengthening/ROM;ADL's and self-care      AM-PAC PT "6 Clicks" Mobility   Outcome Measure  Help needed turning from your back to your side while in a flat bed without using bedrails?: Total Help needed moving from lying on your back to sitting on the side of a flat bed without using bedrails?: Total Help needed moving to and from a bed to a chair (including a wheelchair)?: Total Help needed standing up from a chair using your arms (e.g., wheelchair or bedside chair)?: Total Help needed to walk in hospital room?: Total Help needed climbing 3-5 steps with a railing? : Total 6 Click Score: 6    End of Session Equipment Utilized During Treatment: Oxygen Activity Tolerance: Patient limited by fatigue Patient left: with call bell/phone within reach;in bed;with bed alarm set Nurse Communication: Mobility status PT Visit Diagnosis: Muscle weakness (generalized) (M62.81);Adult, failure to thrive (R62.7);Other abnormalities of gait and mobility (R26.89);Pain     Time: 8280-0349 PT Time Calculation (min) (ACUTE ONLY): 36 min  Charges:   $Neuromuscular Re-education: 8-22 mins                     Tristan Bramble E, PT Acute Rehabilitation Services Pager [phone]  Office [phone]    Nao Linz D Cristian Davitt 03/27/2020, 4:08 PM

## 2020-03-27 NOTE — Progress Notes (Signed)
OT Cancellation Note  Patient Details Name: Shane Banks MRN: [id number] DOB: 05/18/1946   Cancelled Treatment:    Reason Eval/Treat Not Completed: Patient at procedure or test/ unavailable (pt at dialysis this AM. Will re-attempt as appropriate)  Layla Maw 03/27/2020, 8:58 AM

## 2020-03-27 NOTE — Progress Notes (Signed)
[facility] Progress Note  Subjective: seen in HD, pt is not responding  Vitals:   03/27/20 1015 03/27/20 1045 03/27/20 1100 03/27/20 1102  BP: (!) 89/47 (!) 92/57 (!) 78/50 (!) 103/56  Pulse: 93 96 81 92  Resp:    (!) 44  Temp:    98.9 F (37.2 C)  TempSrc:    Axillary  SpO2:    98%  Weight:    63.2 kg  Height:        Exam:  seen upstairs on HD, not following commands, eyes open  no jvd  Chest coarse BS bilat  Cor reg no RG  Abd soft ntnd no ascites, +llq ostomy, +peg tube   Ext worsening bilat UE and hip edema   Nonfocal, +gen'd weakness       Summary:came from [facility] to [facility] after COVID hospitalization which included mech ventilation and trach placement. At [facility] had severe infections/ bacteremia/ PNA and developed AKI felt due to IV gent toxicity requiring initiation of HD 12/16/19. Admitted to ICU [facility] for trach bleed on 5/19. Was getting MWF HD at [facility] prior to admit here.   OP HD: started March 2021 at [facility] as above MWF Getting HD here 3.5- 4h, UF 0.5- 1.5 L, hep 2000 prn admit 62.8kg > range 56.9- 64.7kg here    Assessment/ Plan: 1. 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 resp status tenous- back on vent this past weekend-  Now back on TC.  Full code now.  CXR bilat infiltrates, continues on broad-spec IV abx. Concern is for ongoing aspiration 2. Enterococcus/ MRSE bacteremia HD cath sepsis - TDC removed 5/26, per ID getting 6 wks vanc/ fortaz/ flagyl and meropenam for polymicrobial cath sepsis and sacral osteomyelitis.  3. Sacral osteo/ decub stage IV - as per #2 4. ESRD - on HD since mid March 2021. HD MWF at [facility]. S/P new TDC 6/2 by IR. Plan HD today on schedule.  5. BP/vol - edema worse now, wt's are stable but likely is losing body wt, limited UF because of hypotension on dialysis. Poor CRRT candidate. Max UF next HD on Friday.  7. AMS - variable, seems overall  improved 8. Atrial fib - per primary, on eliquis 2.5 BID 9. Anemia ckd - transfuse prn, Hb remains 9- 10 range. on ESA 10. Hypercalcemia: w/ low PTH, prob from immobility,  improved. Cont low 2.0 Ca++ bath , PTH 29.  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 DNR/ very poor prognosis-  LTACH only option for disposition given needs but apparently not a candidate. Family asked not to be contacted by PCT anymore. Regardless pt remains very sick and w/ poor outlook.      Rob Doctor, hospital 03/27/2020, 3:29 PM   Recent Labs  Lab 03/26/20 0221 03/27/20 0245  K 3.5 3.6  BUN 49* 96*  CREATININE 1.92* 2.85*  CALCIUM 8.8* 9.3  PHOS 2.4* 3.3  HGB 9.0* 9.6*   Inpatient medications: . apixaban  2.5 mg Per Tube BID  . chlorhexidine  15 mL Mouth Rinse BID  . chlorhexidine gluconate (MEDLINE KIT)  15 mL Mouth Rinse BID  . Chlorhexidine Gluconate Cloth  6 each Topical Q0600  . darbepoetin (ARANESP) injection - DIALYSIS  100 mcg Intravenous Q Mon-HD  . feeding supplement (PRO-STAT SUGAR FREE 64)  30 mL Per Tube BID  . insulin aspart  0-6 Units Subcutaneous Q4H  . insulin aspart  2 Units Subcutaneous Q4H  .  levothyroxine  25 mcg Per Tube Q0600  . mouth rinse  15 mL Mouth Rinse q12n4p  . metoprolol tartrate  25 mg Per Tube BID  . metroNIDAZOLE  500 mg Per Tube Q8H  . nutrition supplement (JUVEN)  1 packet Per Tube BID BM  . pantoprazole sodium  40 mg Per Tube Daily  . phenylephrine 200 mcg / ml (NON-ED USE ONLY) injection  100 mcg Intracavernosal Once  . vancomycin  500 mg Intravenous Q M,W,F-HD   . sodium chloride 250 mL (03/24/20 2353)  . cefTAZidime (FORTAZ)  IV 1 g (03/26/20 1807)  . feeding supplement (NEPRO CARB STEADY) 1,000 mL (03/26/20 2057)   sodium chloride, acetaminophen (TYLENOL) oral liquid 160 mg/5 mL, docusate, fentaNYL (SUBLIMAZE) injection, heparin, ipratropium-albuterol, metoprolol tartrate, polyethylene glycol

## 2020-03-27 NOTE — Progress Notes (Signed)
Occupational Therapy Treatment Patient Details Name: Shane Banks MRN: [id number] DOB: [birth year] Today's Date: 03/27/2020    History of present illness 74 yo m with PMHx of afib with RVR, BPH, stroke, and COVID ARDS in January requiring trach and LTACH placement at [facility]. Had been tolerating trach collar, but on 5/19 developed hemoptysis and hypoxemic respiratory failure requiring vent. Transferred to [facility] on 5/19.  5/19 back on vent, 6/7 transitioned to [facility]   OT comments  Guided pt in PT/OT co tx to maximize overall therapeutic benefit. Pt continues to require Total A x 2 for bed mobility to sit EOB. Pt sat EOB for at least 5 minutes to assist in changing positions for overall lung health. Pt requires Max A to maintain sitting balance due to poor trunk control. While sitting EOB, pt also with low BP readings (see below). Pt Total A for simple grooming tasks sitting EOB due to decreased B UE. Guided pt in PROM stretching of B UE with increased tightness noted, more on R side than L. OT only able to don L resting hand splint today, as pt with consistent grimacing during R UE PROM and unable to tolerate R splint. Repositioned pt in attempt to maximize comfort and prevent abnormal body posturing. Educated nursing staff on wearing schedule for L resting hand splint today. Will assess ability to tolerate R resting hand splint this week.    Follow Up Recommendations  SNF;LTACH;Supervision/Assistance - 24 hour    Equipment Recommendations  Other (comment) (TBD)    Recommendations for Other Services      Precautions / Restrictions Precautions Precautions: Fall;Other (comment) Precaution Comments: trach collar, PEG Restrictions Weight Bearing Restrictions: No       Mobility Bed Mobility Overal bed mobility: Needs Assistance Bed Mobility: Supine to Sit;Sit to Supine     Supine to sit: Total assist;+2 for physical assistance Sit to supine: Total assist;+2 for physical  assistance   General bed mobility comments: Pt Total A x 2 for bed mobility to sit EOB. pt sat EOB for about 5 min with grossly Max A to maintain balance, unable to correct posture when cued.   Transfers                 General transfer comment: deferred     Balance Overall balance assessment: Needs assistance Sitting-balance support: Feet unsupported;No upper extremity supported Sitting balance-Leahy Scale: Zero Sitting balance - Comments: max assist to maintain sitting balance                                   ADL either performed or assessed with clinical judgement   ADL Overall ADL's : Needs assistance/impaired     Grooming: Total assistance;Wash/dry face;Sitting Grooming Details (indicate cue type and reason): Total A to wash face sitting EOB due to decreased ability to flex elbows despite HOH attempts                               General ADL Comments: Pt continues with Total A for all ADLs due to limited B UE movement, decreased strength, endurance.      Vision   Vision Assessment?: Vision impaired- to be further tested in functional context;Yes   Perception     Praxis      Cognition Arousal/Alertness: Awake/alert;Lethargic Behavior During Therapy: Flat affect Overall Cognitive Status: Difficult to assess  General Comments: pt slow to respond, frequently shakes head "no" so unsure of reliability of answers. Later noted to nod/mouth "yes"        Exercises Exercises: Other exercises;General Upper Extremity General Exercises - Upper Extremity Shoulder Flexion: PROM;Both;5 reps;Supine Shoulder ABduction: PROM;Both;5 reps;Supine Elbow Flexion: PROM;5 reps;Supine Elbow Extension: PROM;Both;5 reps;Supine Wrist Flexion: PROM;Both;5 reps;Supine Wrist Extension: PROM;Both;5 reps;Supine Composite Extension: PROM;Both;5 reps;Supine Other Exercises Other Exercises: PROM in prep for splint  wear. Only able to don L splint today as pt appeared with too much pain while attempting to don R splint   Shoulder Instructions       General Comments While sitting EOB, assessed BP at 87/55, after 1-2 min sitting EOB at 90s/60s    Pertinent Vitals/ Pain       Pain Assessment: Faces Faces Pain Scale: Hurts little more Pain Location: ROM/mobility of R UE/R LE Pain Descriptors / Indicators: Discomfort;Grimacing;Guarding Pain Intervention(s): Limited activity within patient's tolerance;Monitored during session;Repositioned  Home Living                                          Prior Functioning/Environment              Frequency  Min 2X/week        Progress Toward Goals  OT Goals(current goals can now be found in the care plan section)  Progress towards OT goals: OT to reassess next treatment  Acute Rehab OT Goals Patient Stated Goal: not stated OT Goal Formulation: Patient unable to participate in goal setting Time For Goal Achievement: 04/02/20 Potential to Achieve Goals: Fair ADL Goals Pt Will Perform Upper Body Bathing: with max assist;bed level Additional ADL Goal #1: Demonstrate increased of R shoulder P/AAROM to 75 degrees after using mobilization techniques to increase functional use RUE Additional ADL Goal #2: Pt will complete hand to mouth pattern with mod A using L UE inpreparation for ADL tasks Additional ADL Goal #3: pt will tolerate bilateral resting hand splints 4 hours on/off schedule to maintain a stretch in hands and increase functional use.  Plan Discharge plan remains appropriate    Co-evaluation    PT/OT/SLP Co-Evaluation/Treatment: Yes Reason for Co-Treatment: Complexity of the patient's impairments (multi-system involvement);Necessary to address cognition/behavior during functional activity;For patient/therapist safety   OT goals addressed during session: Strengthening/ROM;ADL's and self-care      AM-PAC OT "6 Clicks"  Daily Activity     Outcome Measure   Help from another person eating meals?: Total Help from another person taking care of personal grooming?: Total Help from another person toileting, which includes using toliet, bedpan, or urinal?: Total Help from another person bathing (including washing, rinsing, drying)?: Total Help from another person to put on and taking off regular upper body clothing?: Total Help from another person to put on and taking off regular lower body clothing?: Total 6 Click Score: 6    End of Session Equipment Utilized During Treatment: Oxygen  OT Visit Diagnosis: Other abnormalities of gait and mobility (R26.89);Muscle weakness (generalized) (M62.81);Other symptoms and signs involving cognitive function;Pain Pain - Right/Left: Right Pain - part of body: Shoulder;Arm;Hand;Hip;Knee;Leg   Activity Tolerance Patient tolerated treatment well   Patient Left in bed;with call bell/phone within reach;with SCD's reapplied   Nurse Communication Mobility status;Other (comment) (BPs, splint wearq)        Time: 0177-9390 OT Time Calculation (min): 37 min  Charges: OT General  Charges $OT Visit: 1 Visit OT Treatments $Therapeutic Activity: 8-22 mins  Layla Maw, OTR/L   Layla Maw 03/27/2020, 2:52 PM

## 2020-03-27 NOTE — Progress Notes (Signed)
[facility] PROGRESS NOTE    Progress Note  Shane Banks  XKP:[id number] DOB: 1946-05-20 DOA: 02/14/2020 PCP: [name], MD     Brief Narrative:   Shane Banks is an 74 y.o. male past medical history significant of chronic respiratory failure status post tracheostomy, end-stage renal disease on dialysis, chronic atrial fibrillation nonhemorrhagic stroke who [facility] for evaluation of trach site bleeding.  Patient had a recent Covid pneumonia and has been on the vent because of progressive ARDS.  Because of poor improvement in the event underwent tracheostomy and discharged to [facility].  He recently had a multidrug-resistant Pseudomonas and trichomonas bacteremia.  On 03/15/2020 patient returned to the ICU for ventilatory and pressor support he developed septic shock and developed acute renal failure requiring dialysis.  On 02/14/2020 developed hemoptysis and was transferred to [facility] for evaluation.  He is also noted to have a sacral decubitus ulcer osteomyelitis leading to sepsis bacteremia.  He is now off pressors but continues to be vent dependent with a tracheostomy.  PCCM is following him trying to wean off trach collar they are following him weekly.  Patient was taken off the vent on 03/21/2020.  Transfer to the floor in 03/22/2020.  Due to the lack of insurance approval he cannot go back to [facility].  Social worker is working on Clinical research associate.  He will need to be on a cuffless trach's to be effective.  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 Transfer back to [facility] service on 5 L of oxygen. Pulmonary critical care to follow-up and advised and transition to cuffless trach.  He remains a full code. On broad-spectrum antibiotics for bilateral infiltrates she is on  x-ray with thick purulent secretions concerning for aspiration. Patient is medically stable and awaiting placement to skilled nursing facility.  Sacral osteomyelitis and HD catheter sepsis/  Enterococcal bacteremia: Tunneled catheter removal on 02/21/2020. Will require 6 weeks of IV antibiotics per ID he will require bank Rocephin and Flagyl.  Paroxysmal atrial fibrillation: Currently rate controlled anticoagulation was discontinued due to suspected GI bleed. Currently on metoprolol.  End-stage renal disease: On HD since March 2021 status post tunneled catheter placement 02/28/2020.  Chronic normocytic anemia: Due to anemia of chronic renal disease transfuse 1 unit of packed red blood cells continue erythropoietin and IV iron per renal.  History of nonhemorrhagic CVA: CT of the head showed atrophy and chronic microvascular disease. Not on anticoagulation due to possible GI bleed.  Unstageable sacral pressure ulcer with osteomyelitis: Continue wound care recommendations.  Severe protein caloric malnutrition: Continue tube feedings.  Ethics: We will consult palliative care to discuss with the family goals of care.  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 (Active)  02/14/20 1906  Location: Sacrum  Location Orientation: Medial  Staging: Stage 4 - Full thickness tissue loss with exposed bone, tendon or muscle.  Wound Description (Comments):   Present on Admission: Yes    Estimated body mass index is 20.58 kg/m as calculated from the following:   Height as of this encounter: 5' 9"  (1.753 m).   Weight as of this encounter: 63.2 kg. Malnutrition Type:  Nutrition Problem: Severe Malnutrition Etiology: chronic illness (chronic respiratory failure related to COVID ARDS)   Malnutrition  Characteristics:  Signs/Symptoms: severe  muscle depletion, severe fat depletion   Nutrition Interventions:  Interventions: Prostat, Tube feeding, Juven    DVT prophylaxis: lovenox  Family Communication:none Status is: Inpatient  Remains inpatient appropriate because:Hemodynamically unstable   Dispo:  Patient From:    Planned Disposition: [place]  Expected discharge date: 04/01/20  Medically stable for discharge: Yes         Code Status:     Code Status Orders  (From admission, onward)         Start     Ordered   03/15/20 1935  Full code  Continuous        03/15/20 1934        Code Status History    Date Active Date Inactive Code Status Order ID Comments User Context   02/14/2020 1749 03/15/2020 1934 DNR [id number]  Corey Harold, NP ED   12/12/2019 0739 02/14/2020 1420 DNR [id number]  Agnes Lawrence Inpatient   11/27/2019 0814 12/12/2019 0739 Full Code [id number]  Agnes Lawrence Inpatient   11/22/2019 0059 11/27/2019 0814 DNR [id number]  Maudry Diego Inpatient   Advance Care Planning Activity        IV Access:    Peripheral IV   Procedures and diagnostic studies:   DG Swallowing Func-Speech Pathology  Result Date: 03/26/2020 Objective Swallowing Evaluation: Type of Study: MBS-Modified Barium Swallow Study  Patient Details Name: Shane Banks MRN: [id number] Date of Birth: Mar 17, 1946 Today's Date: 03/26/2020 Time: SLP Start Time (ACUTE ONLY): 7 -SLP Stop Time (ACUTE ONLY): 1209 SLP Time Calculation (min) (ACUTE ONLY): 39 min Past Medical History: Past Medical History: Diagnosis Date . Acute on chronic respiratory failure with hypoxia ([place])  . Atrial fibrillation with RVR ([place])  . BPH (benign prostatic hyperplasia)  . COVID-19 virus infection  . Pneumonia due to COVID-19 virus  . Severe sepsis ([place])  . Stroke [facility])  Past Surgical History: Past Surgical History: Procedure Laterality Date . IR FLUORO GUIDE CV LINE RIGHT  12/15/2019 . IR FLUORO GUIDE CV LINE RIGHT  02/28/2020 . IR  THORACENTESIS ASP PLEURAL SPACE W/IMG GUIDE  12/21/2019 . IR US GUIDE VASC ACCESS RIGHT  12/15/2019 . IR US GUIDE VASC ACCESS RIGHT  02/28/2020 HPI: 74 yo m with PMHx of afib with RVR, BPH, stroke, and COVID ARDS in January requiring trach and LTACH placement at [facility]. Had been tolerating trach collar, but on 5/19 developed hemoptysis and hypoxemic respiratory failure requiring vent. Transferred to [facility] on 5/19. PMSV desired for discussion with pt regarding goals of care.  No data recorded Assessment / Plan / Recommendation CHL IP CLINICAL IMPRESSIONS 03/26/2020 Clinical Impression Pt participated in first instrumental swallow assessment during prolonged hospitalization since Jan 2021 wearing PMV. Oral impairments include reduced lingual movement and inefficient propulsion leaving lingual residue and piecemeal swallows. During the swallow he is able to achieve adequate laryngeal closure to protect trachea during the swallow but reduced laryngeal elevation leads to vallecular and pyriform sinus residue. During additional cued or spontaneous swallow pyriform sinus residue is penetration silently onto vocal cords, aspirated during the swallow briefly coming to rest on cords without pt sensation. Cued throat clears achieved but poor respiratory support prevents effective cough. As his endurance and diaphragmatic strength improve, swallow should as well. Continue NPO status and ST.   SLP Visit Diagnosis Dysphagia, oropharyngeal phase (R13.12) Attention and concentration deficit following -- Frontal lobe and executive function deficit following -- Impact on safety and function Severe aspiration risk  CHL IP TREATMENT RECOMMENDATION 03/26/2020 Treatment Recommendations Therapy as outlined in treatment plan below   Prognosis 03/26/2020 Prognosis for Safe Diet Advancement Fair Barriers to Reach Goals Severity of deficits;Time post onset Barriers/Prognosis Comment -- CHL IP DIET RECOMMENDATION 03/26/2020 SLP Diet  Recommendations NPO Liquid Administration via -- Medication Administration Via alternative means Compensations -- Postural Changes --   CHL IP OTHER RECOMMENDATIONS 03/26/2020 Recommended Consults -- Oral Care Recommendations Oral care QID Other Recommendations --   CHL IP FOLLOW UP RECOMMENDATIONS 03/26/2020 Follow up Recommendations LTACH   CHL IP FREQUENCY AND DURATION 03/26/2020 Speech Therapy Frequency (ACUTE ONLY) min 2x/week Treatment Duration 2 weeks      CHL IP ORAL PHASE 03/26/2020 Oral Phase Impaired Oral - Pudding Teaspoon -- Oral - Pudding Cup -- Oral - Honey Teaspoon Delayed oral transit;Decreased bolus cohesion;Lingual/palatal residue;Reduced posterior propulsion;Weak lingual manipulation Oral - Honey Cup -- Oral - Nectar Teaspoon Delayed oral transit;Decreased bolus cohesion;Lingual/palatal residue;Reduced posterior propulsion;Weak lingual manipulation Oral - Nectar Cup -- Oral - Nectar Straw -- Oral - Thin Teaspoon -- Oral - Thin Cup -- Oral - Thin Straw -- Oral - Puree -- Oral - Mech Soft -- Oral - Regular -- Oral - Multi-Consistency -- Oral - Pill -- Oral Phase - Comment --  CHL IP PHARYNGEAL PHASE 03/26/2020 Pharyngeal Phase Impaired Pharyngeal- Pudding Teaspoon -- Pharyngeal -- Pharyngeal- Pudding Cup -- Pharyngeal -- Pharyngeal- Honey Teaspoon Pharyngeal residue - valleculae;Penetration/Aspiration during swallow;Pharyngeal residue - pyriform Pharyngeal Material enters airway, remains ABOVE vocal cords and not ejected out Pharyngeal- Honey Cup -- Pharyngeal -- Pharyngeal- Nectar Teaspoon Pharyngeal residue - pyriform;Pharyngeal residue - valleculae;Penetration/Aspiration during swallow Pharyngeal Material enters airway, CONTACTS cords and not ejected out;Material enters airway, passes BELOW cords then ejected out Pharyngeal- Nectar Cup -- Pharyngeal -- Pharyngeal- Nectar Straw -- Pharyngeal -- Pharyngeal- Thin Teaspoon -- Pharyngeal -- Pharyngeal- Thin Cup -- Pharyngeal -- Pharyngeal- Thin Straw --  Pharyngeal -- Pharyngeal- Puree -- Pharyngeal -- Pharyngeal- Mechanical Soft -- Pharyngeal -- Pharyngeal- Regular -- Pharyngeal -- Pharyngeal- Multi-consistency -- Pharyngeal -- Pharyngeal- Pill -- Pharyngeal -- Pharyngeal Comment --  CHL IP CERVICAL ESOPHAGEAL PHASE 03/26/2020 Cervical Esophageal Phase Impaired Pudding Teaspoon -- Pudding Cup -- Honey Teaspoon -- Honey Cup -- Nectar Teaspoon -- Nectar Cup -- Nectar Straw -- Thin Teaspoon -- Thin Cup -- Thin Straw -- Puree -- Mechanical Soft -- Regular -- Multi-consistency -- Pill -- Cervical Esophageal Comment -- [name] 03/26/2020, 2:14 PM Orbie Pyo Litaker M.Ed Actor Pager [phone] Office [phone]              US Abdomen Limited RUQ  Result Date: 03/25/2020 CLINICAL DATA:  Sepsis EXAM: ULTRASOUND ABDOMEN LIMITED RIGHT UPPER QUADRANT COMPARISON:  CT 12/21/2019 FINDINGS: Gallbladder: No gallstones or wall thickening visualized. No sonographic Murphy sign noted by sonographer. Common bile duct: Diameter: 4.5 mm Liver: No focal lesion identified. Within normal limits in parenchymal echogenicity. Portal vein is patent on color Doppler imaging with normal direction of blood flow towards the liver. Other: Right kidney is echogenic. IMPRESSION: 1. Negative for gallstones.  No focal hepatic abnormality. 2. Echogenic right kidney consistent with medical renal disease. Electronically Signed   By: Donavan Foil M.D.   On: 03/25/2020 20:00     Medical Consultants:    None.  Anti-Infectives:   IV Vanco, Rocephin and Flagyl  Subjective:    Kari Kerth denies any pain any shortness of breath.  Objective:    Vitals:   03/27/20 1015 03/27/20 1045 03/27/20  1100 03/27/20 1102  BP: (!) 89/47 (!) 92/57 (!) 78/50 (!) 103/56  Pulse: 93 96 81 92  Resp:    (!) 44  Temp:    98.9 F (37.2 C)  TempSrc:    Axillary  SpO2:    98%  Weight:    63.2 kg  Height:       SpO2: 98 % O2 Flow Rate (L/min): 5 L/min FiO2  (%): 28 %   Intake/Output Summary (Last 24 hours) at 03/27/2020 1327 Last data filed at 03/27/2020 1102 Gross per 24 hour  Intake 650 ml  Output 1239 ml  Net -589 ml   Filed Weights   03/27/20 0500 03/27/20 0720 03/27/20 1102  Weight: 66.5 kg 63.5 kg 63.2 kg    Exam: General exam: In no acute distress trach in place Respiratory system: Good air movement and clear to auscultation. Cardiovascular system: S1 & S2 heard, RRR. Gastrointestinal system: Abdomen is nondistended, soft and nontender, PEG tube in place Extremities: No pedal edema. Skin: No rashes, lesions or ulcers   Data Reviewed:    Labs: Basic Metabolic Panel: Recent Labs  Lab 03/23/20 0358 03/23/20 0358 03/24/20 0222 03/24/20 0222 03/25/20 0403 03/25/20 0403 03/26/20 0221 03/27/20 0245  NA 135  --  136  --  135  --  138 139  K 3.6   < > 3.8   < > 4.8   < > 3.5 3.6  CL 99  --  100  --  99  --  104 104  CO2 26  --  28  --  24  --  26 27  GLUCOSE 109*  --  112*  --  97  --  157* 130*  BUN 45*  --  91*  --  137*  --  49* 96*  CREATININE 1.82*  --  2.66*  --  3.46*  --  1.92* 2.85*  CALCIUM 9.1  --  9.5  --  9.8  --  8.8* 9.3  PHOS 2.1*  --  3.3  --  4.3  --  2.4* 3.3   < > = values in this interval not displayed.   GFR Estimated Creatinine Clearance: 20.9 mL/min (A) (by C-G formula based on SCr of 2.85 mg/dL (H)). Liver Function Tests: Recent Labs  Lab 03/24/20 0222 03/25/20 0403 03/25/20 1053 03/26/20 0221 03/27/20 0245  AST  --   --  70*  --   --   ALT  --   --  16  --   --   ALKPHOS  --   --  101  --   --   BILITOT  --   --  1.1  --   --   PROT  --   --  7.8  --   --   ALBUMIN 2.1* 2.1* 2.2* 1.9* 1.9*   No results for input(s): LIPASE, AMYLASE in the last 168 hours. No results for input(s): AMMONIA in the last 168 hours. Coagulation profile No results for input(s): INR, PROTIME in the last 168 hours. COVID-19 Labs  No results for input(s): DDIMER, FERRITIN, LDH, CRP in the last 72  hours.  Lab Results  Component Value Date   SARSCOV2NAA NEGATIVE 02/14/2020   [place] NEGATIVE 12/20/2019    CBC: Recent Labs  Lab 03/23/20 0358 03/23/20 0358 03/24/20 0222 03/25/20 0403 03/25/20 1541 03/26/20 0221 03/27/20 0245  WBC 14.1*   < > 14.8* 15.9* 15.9* 15.3* 16.7*  NEUTROABS 9.2*  --  9.6* 10.7*  --  10.7* 10.9*  HGB 9.1*   < > 9.5* 10.3* 8.5* 9.0* 9.6*  HCT 29.9*   < > 30.9* 31.8* 28.0* 29.2* 30.9*  MCV 82.8   < > 83.3 81.3 82.8 82.0 81.7  PLT 242   < > 256 229 264 232 273   < > = values in this interval not displayed.   Cardiac Enzymes: No results for input(s): CKTOTAL, CKMB, CKMBINDEX, TROPONINI in the last 168 hours. BNP (last 3 results) No results for input(s): PROBNP in the last 8760 hours. CBG: Recent Labs  Lab 03/26/20 1620 03/26/20 1935 03/27/20 0133 03/27/20 0535 03/27/20 1153  GLUCAP 150* 128* 128* 132* 154*   D-Dimer: No results for input(s): DDIMER in the last 72 hours. Hgb A1c: No results for input(s): HGBA1C in the last 72 hours. Lipid Profile: No results for input(s): CHOL, HDL, LDLCALC, TRIG, CHOLHDL, LDLDIRECT in the last 72 hours. Thyroid function studies: No results for input(s): TSH, T4TOTAL, T3FREE, THYROIDAB in the last 72 hours.  Invalid input(s): FREET3 Anemia work up: No results for input(s): VITAMINB12, FOLATE, FERRITIN, TIBC, IRON, RETICCTPCT in the last 72 hours. Sepsis Labs: Recent Labs  Lab 03/25/20 0403 03/25/20 1541 03/26/20 0221 03/27/20 0245  WBC 15.9* 15.9* 15.3* 16.7*   Microbiology Recent Results (from the past 240 hour(s))  Culture, blood (routine x 2)     Status: None   Collection Time: 03/17/20  8:32 PM   Specimen: BLOOD RIGHT HAND  Result Value Ref Range Status   Specimen Description BLOOD RIGHT HAND  Final   Special Requests   Final    BOTTLES DRAWN AEROBIC ONLY Blood Culture adequate volume   Culture   Final    NO GROWTH 5 DAYS Performed at [facility], [address],  [place], [place] [address]    Report Status 03/22/2020 FINAL  Final  Culture, blood (routine x 2)     Status: None   Collection Time: 03/17/20  8:39 PM   Specimen: BLOOD LEFT HAND  Result Value Ref Range Status   Specimen Description BLOOD LEFT HAND  Final   Special Requests   Final    BOTTLES DRAWN AEROBIC ONLY Blood Culture adequate volume   Culture   Final    NO GROWTH 5 DAYS Performed at [facility], [place] [address], [place],  [address]    Report Status 03/22/2020 FINAL  Final     Medications:   . apixaban  2.5 mg Per Tube BID  . chlorhexidine  15 mL Mouth Rinse BID  . chlorhexidine gluconate (MEDLINE KIT)  15 mL Mouth Rinse BID  . Chlorhexidine Gluconate Cloth  6 each Topical Q0600  . darbepoetin (ARANESP) injection - DIALYSIS  100 mcg Intravenous Q Mon-HD  . feeding supplement (PRO-STAT SUGAR FREE 64)  30 mL Per Tube BID  . insulin aspart  0-6 Units Subcutaneous Q4H  . insulin aspart  2 Units Subcutaneous Q4H  . levothyroxine  25 mcg Per Tube Q0600  . mouth rinse  15 mL Mouth Rinse q12n4p  . metoprolol tartrate  25 mg Per Tube BID  . metroNIDAZOLE  500 mg Per Tube Q8H  . nutrition supplement (JUVEN)  1 packet Per Tube BID BM  . pantoprazole sodium  40 mg Per Tube Daily  . phenylephrine 200 mcg / ml (NON-ED USE ONLY) injection  100 mcg Intracavernosal Once  . vancomycin  500 mg Intravenous Q M,W,F-HD   Continuous Infusions: . sodium chloride 250 mL (03/24/20 2353)  .  cefTAZidime (FORTAZ)  IV 1 g (03/26/20 1807)  . feeding supplement (NEPRO CARB STEADY) 1,000 mL (03/26/20 2057)      LOS: 55 days   Charlynne Cousins  [facility]  03/27/2020, 1:27 PM

## 2020-03-28 ENCOUNTER — Inpatient Hospital Stay (HOSPITAL_COMMUNITY): Payer: Medicare HMO

## 2020-03-28 DIAGNOSIS — J151 Pneumonia due to Pseudomonas: Secondary | ICD-10-CM

## 2020-03-28 LAB — RENAL FUNCTION PANEL
Albumin: 1.9 g/dL — ABNORMAL LOW (ref 3.5–5.0)
Anion gap: 10 (ref 5–15)
BUN: 66 mg/dL — ABNORMAL HIGH (ref 8–23)
CO2: 27 mmol/L (ref 22–32)
Calcium: 9.1 mg/dL (ref 8.9–10.3)
Chloride: 99 mmol/L (ref 98–111)
Creatinine, Ser: 2.2 mg/dL — ABNORMAL HIGH (ref 0.61–1.24)
GFR calc Af Amer: 33 mL/min — ABNORMAL LOW (ref >60)
GFR calc non Af Amer: 29 mL/min — ABNORMAL LOW (ref >60)
Glucose, Bld: 123 mg/dL — ABNORMAL HIGH (ref 70–99)
Phosphorus: 2.7 mg/dL (ref 2.5–4.6)
Potassium: 3.3 mmol/L — ABNORMAL LOW (ref 3.5–5.1)
Sodium: 136 mmol/L (ref 135–145)

## 2020-03-28 LAB — CBC WITH DIFFERENTIAL/PLATELET
Abs Immature Granulocytes: 0.18 10*3/uL — ABNORMAL HIGH (ref 0.00–0.07)
Basophils Absolute: 0.1 10*3/uL (ref 0.0–0.1)
Basophils Relative: 0 %
Eosinophils Absolute: 0.9 10*3/uL — ABNORMAL HIGH (ref 0.0–0.5)
Eosinophils Relative: 5 %
HCT: 31.3 % — ABNORMAL LOW (ref 39.0–52.0)
Hemoglobin: 9.6 g/dL — ABNORMAL LOW (ref 13.0–17.0)
Immature Granulocytes: 1 %
Lymphocytes Relative: 17 %
Lymphs Abs: 3.1 10*3/uL (ref 0.7–4.0)
MCH: 25.2 pg — ABNORMAL LOW (ref 26.0–34.0)
MCHC: 30.7 g/dL (ref 30.0–36.0)
MCV: 82.2 fL (ref 80.0–100.0)
Monocytes Absolute: 1.6 10*3/uL — ABNORMAL HIGH (ref 0.1–1.0)
Monocytes Relative: 9 %
Neutro Abs: 12.5 10*3/uL — ABNORMAL HIGH (ref 1.7–7.7)
Neutrophils Relative %: 68 %
Platelets: 228 10*3/uL (ref 150–400)
RBC: 3.81 MIL/uL — ABNORMAL LOW (ref 4.22–5.81)
RDW: 18.6 % — ABNORMAL HIGH (ref 11.5–15.5)
WBC: 18.4 10*3/uL — ABNORMAL HIGH (ref 4.0–10.5)
nRBC: 0 % (ref 0.0–0.2)

## 2020-03-28 LAB — URINALYSIS, ROUTINE W REFLEX MICROSCOPIC
Bilirubin Urine: NEGATIVE
Glucose, UA: NEGATIVE mg/dL
Ketones, ur: NEGATIVE mg/dL
Nitrite: NEGATIVE
Protein, ur: 100 mg/dL — AB
RBC / HPF: >50 RBC/hpf — ABNORMAL HIGH (ref 0–5)
Specific Gravity, Urine: 1.017 (ref 1.005–1.030)
WBC, UA: >50 WBC/hpf — ABNORMAL HIGH (ref 0–5)
pH: 5 (ref 5.0–8.0)

## 2020-03-28 LAB — GLUCOSE, CAPILLARY
Glucose-Capillary: 113 mg/dL — ABNORMAL HIGH (ref 70–99)
Glucose-Capillary: 117 mg/dL — ABNORMAL HIGH (ref 70–99)
Glucose-Capillary: 131 mg/dL — ABNORMAL HIGH (ref 70–99)
Glucose-Capillary: 151 mg/dL — ABNORMAL HIGH (ref 70–99)
Glucose-Capillary: 155 mg/dL — ABNORMAL HIGH (ref 70–99)
Glucose-Capillary: 166 mg/dL — ABNORMAL HIGH (ref 70–99)

## 2020-03-28 MED ORDER — SODIUM CHLORIDE 0.9 % IV SOLN
500.0000 mg | INTRAVENOUS | Status: AC
  Administered 2020-03-28 – 2020-04-04 (×7): 500 mg via INTRAVENOUS
  Filled 2020-03-28 (×8): qty 0.5

## 2020-03-28 MED ORDER — CHLORHEXIDINE GLUCONATE CLOTH 2 % EX PADS
6.0000 | MEDICATED_PAD | Freq: Every day | CUTANEOUS | Status: DC
  Administered 2020-03-29 – 2020-03-31 (×3): 6 via TOPICAL

## 2020-03-28 NOTE — Progress Notes (Signed)
[facility] Progress Note  Subjective: seen in room, not responding  Vitals:   03/28/20 0537 03/28/20 0744 03/28/20 0807 03/28/20 1158  BP: 135/70 138/61 122/60 131/70  Pulse: 89 90 90   Resp: (!) 30 (!) 40 (!) 33 (!) 36  Temp: 98 F (36.7 C)  (!) 100.4 F (38 C)   TempSrc:   Axillary   SpO2: 96% 97% 96%   Weight:      Height:        Exam:  not following commands, eyes open  no jvd  Chest coarse BS bilat  Cor reg no RG  Abd soft ntnd no ascites, +llq ostomy, +peg tube   Ext worsening bilat UE and hip edema   Nonfocal, +gen'd weakness       Summary:[facility] from [facility] after COVID hospitalization which included mech ventilation and trach placement. At [facility] had severe infections/ bacteremia/ PNA and developed AKI felt due to IV gent toxicity requiring initiation of HD 12/16/19. Admitted to ICU [facility] for trach bleed on 5/19. Was getting MWF HD at [facility] prior to admit here.   OP HD: started March 2021 at [facility] as above MWF Getting HD here 3.5- 4h, UF 0.5- 1.5 L, hep 2000 prn admit 62.8kg > range 56.9- 64.7kg here    Assessment/ Plan: 1. 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 resp status tenous- back on vent this past weekend-  Now back on TC.  Full code now.  CXR bilat infiltrates, continues on broad-spec IV abx. Concern is for ongoing aspiration 2. Enterococcus/ MRSE bacteremia HD cath sepsis - TDC removed 5/26, per ID getting 6 wks vanc/ fortaz/ flagyl and meropenam for polymicrobial cath sepsis and sacral osteomyelitis.  3. Sacral osteo/ decub stage IV - as per #2 4. ESRD - on HD since mid March 2021. HD MWF at [facility]. S/P new TDC 6/2 by IR. Next HD Friday.  5. BP/vol - edema worse now, wt's are stable but likely is losing body wt, limited UF because of hypotension on dialysis. Poor CRRT candidate. Max UF next HD on Friday.  7. AMS - variable, seems overall improved 8. Atrial fib  - per primary, on eliquis 2.5 BID 9. Anemia ckd - transfuse prn, Hb remains 9- 10 range. on ESA 10. Hypercalcemia: w/ low PTH, prob from immobility,  improved. Cont low 2.0 Ca++ bath , PTH 29.  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 DNR/ very poor prognosis-  LTACH only option for disposition given needs but apparently not a candidate. Family asked not to be contacted by PCT anymore. Regardless pt remains very sick and w/ poor outlook.      Shane Banks 03/28/2020, 2:35 PM   Recent Labs  Lab 03/27/20 0245 03/27/20 0245 03/27/20 2117 03/28/20 0233  K 3.6   < > 3.5 3.3*  BUN 96*  --   --  66*  CREATININE 2.85*  --   --  2.20*  CALCIUM 9.3  --   --  9.1  PHOS 3.3  --   --  2.7  HGB 9.6*  --   --  9.6*   < > = values in this interval not displayed.   Inpatient medications: . apixaban  2.5 mg Per Tube BID  . chlorhexidine  15 mL Mouth Rinse BID  . chlorhexidine gluconate (MEDLINE KIT)  15 mL Mouth Rinse BID  . Chlorhexidine Gluconate Cloth  6 each Topical Q0600  .  darbepoetin (ARANESP) injection - DIALYSIS  100 mcg Intravenous Q Mon-HD  . feeding supplement (PRO-STAT SUGAR FREE 64)  30 mL Per Tube BID  . insulin aspart  0-6 Units Subcutaneous Q4H  . insulin aspart  2 Units Subcutaneous Q4H  . levothyroxine  25 mcg Per Tube Q0600  . mouth rinse  15 mL Mouth Rinse q12n4p  . metoprolol tartrate  25 mg Per Tube BID  . metroNIDAZOLE  500 mg Per Tube Q8H  . nutrition supplement (JUVEN)  1 packet Per Tube BID BM  . pantoprazole sodium  40 mg Per Tube Daily  . phenylephrine 200 mcg / ml (NON-ED USE ONLY) injection  100 mcg Intracavernosal Once  . vancomycin  500 mg Intravenous Q M,W,F-HD   . sodium chloride 250 mL (03/24/20 2353)  . cefTAZidime (FORTAZ)  IV 1 g (03/27/20 1702)  . feeding supplement (NEPRO CARB STEADY) 50 mL/hr at 03/28/20 1000   sodium chloride, acetaminophen (TYLENOL) oral liquid 160 mg/5 mL, docusate, fentaNYL (SUBLIMAZE) injection, heparin, ipratropium-albuterol,  metoprolol tartrate, polyethylene glycol

## 2020-03-28 NOTE — Progress Notes (Signed)
  Speech Language Pathology Treatment: Dysphagia;Passy Muir Speaking valve  Patient Details Name: Shane Banks MRN: [id number] DOB: 21-May-1946 Today's Date: 03/28/2020 Time: 6122-4497 SLP Time Calculation (min) (ACUTE ONLY): 20 min  Assessment / Plan / Recommendation Clinical Impression  Treatment consisted of verbal communication, respiratory support for speech and cough using PMV and introduction of EMST (expiratory muscle strength training). No air trapping and pt wore valve throughout session without signs of distress. Reflexive cough was strong but mild secretions remained.  His labial support around device was adequate and highest resistance for device achieved was 6 cm H20 (ranged 5-20) indicative of poor respiratory effort and ability. Performed 10 repetitions. ST will continue therapy to maximize communicative and swallow ability.    HPI HPI: 74 yo m with PMHx of afib with RVR, BPH, stroke, and COVID ARDS in January requiring trach and LTACH placement at [facility]. Had been tolerating trach collar, but on 5/19 developed hemoptysis and hypoxemic respiratory failure requiring vent. Transferred to [facility] on 5/19. PMSV desired for discussion with pt regarding goals of care.      SLP Plan  Continue with current plan of care       Recommendations         Patient may use Passy-Muir Speech Valve: During all therapies with supervision PMSV Supervision: Full MD: Please consider changing trach tube to : Cuffless         Oral Care Recommendations: Oral care QID Follow up Recommendations: LTACH SLP Visit Diagnosis: Aphonia (R49.1);Dysphagia, unspecified (R13.10) Plan: Continue with current plan of care       GO                [name] 03/28/2020, 4:20 PM  Orbie Pyo Colvin Caroli.Ed Risk analyst [phone] Office [phone]

## 2020-03-28 NOTE — Progress Notes (Signed)
Occupational Therapy Treatment Patient Details Name: Shane Banks MRN: [id number] DOB: 21-Dec-1945 Today's Date: 03/28/2020    History of present illness 74 yo m with PMHx of afib with RVR, BPH, stroke, and COVID ARDS in January requiring trach and LTACH placement at [facility]. Had been tolerating trach collar, but on 5/19 developed hemoptysis and hypoxemic respiratory failure requiring vent. Transferred to [facility] on 5/19.  5/19 back on vent, 6/7 transitioned to [facility]   OT comments  Continued assessment of splinting assessment completed. Extended time spent manually stretching pt at all B UE joints and repositioning for optimal joint integrity. Left resting hand splint able to be donned without difficulty and pt tolerating 2 hours. However, still unable to appropriately don right resting hand splint. Plan to contact [facility] for additional splinting options to be explored.    Follow Up Recommendations  SNF;LTACH;Supervision/Assistance - 24 hour    Equipment Recommendations  Other (comment) (TBA)    Recommendations for Other Services      Precautions / Restrictions Precautions Precautions: Fall;Other (comment) Precaution Comments: trach collar, PEG       Mobility Bed Mobility                  Transfers                      Balance                                           ADL either performed or assessed with clinical judgement   ADL Overall ADL's : Needs assistance/impaired                                       General ADL Comments: Pt continues with Total A for all ADLs due to limited B UE movement, decreased strength, endurance.      Vision       Perception     Praxis      Cognition Arousal/Alertness: Awake/alert;Lethargic Behavior During Therapy: Flat affect Overall Cognitive Status: Difficult to assess                                 General Comments: pt with improved responses today  to yes/no questions        Exercises General Exercises - Upper Extremity Shoulder Flexion: PROM;Both;5 reps;Supine Shoulder ABduction: PROM;Both;5 reps;Supine Elbow Flexion: PROM;5 reps;Supine Elbow Extension: PROM;Both;5 reps;Supine Wrist Flexion: PROM;Both;5 reps;Supine Wrist Extension: PROM;Both;5 reps;Supine Digit Composite Flexion: PROM;Both;5 reps;Supine Composite Extension: PROM;Both;5 reps;Supine   Shoulder Instructions       General Comments Continued assessment of splinting needs. Provided PROM to all joints to decrease contracture progression. Left resting hand splint applied and tolerated for 2 hours before removal. Pt right resting hand splint could not be donned today either. Plan to contact [facility] for more splinting options as appropriate. Repositioned B UE on pillows to improve comfort and joint positioning    Pertinent Vitals/ Pain       Pain Assessment: Faces Faces Pain Scale: Hurts even more Pain Location: generalized during ROM, mobility Pain Descriptors / Indicators: Discomfort;Grimacing;Guarding Pain Intervention(s): Limited activity within patient's tolerance;Monitored during session;Repositioned  Home Living  Prior Functioning/Environment              Frequency  Min 2X/week        Progress Toward Goals  OT Goals(current goals can now be found in the care plan section)  Progress towards OT goals: OT to reassess next treatment  Acute Rehab OT Goals Patient Stated Goal: not stated OT Goal Formulation: Patient unable to participate in goal setting Time For Goal Achievement: 04/02/20 Potential to Achieve Goals: Fair ADL Goals Pt Will Perform Upper Body Bathing: with max assist;bed level Additional ADL Goal #1: Demonstrate increased of R shoulder P/AAROM to 75 degrees after using mobilization techniques to increase functional use RUE Additional ADL Goal #2: Pt will complete hand to  mouth pattern with mod A using L UE inpreparation for ADL tasks Additional ADL Goal #3: pt will tolerate bilateral resting hand splints 4 hours on/off schedule to maintain a stretch in hands and increase functional use.  Plan Discharge plan remains appropriate    Co-evaluation                 AM-PAC OT "6 Clicks" Daily Activity     Outcome Measure   Help from another person eating meals?: Total Help from another person taking care of personal grooming?: Total Help from another person toileting, which includes using toliet, bedpan, or urinal?: Total Help from another person bathing (including washing, rinsing, drying)?: Total Help from another person to put on and taking off regular upper body clothing?: Total Help from another person to put on and taking off regular lower body clothing?: Total 6 Click Score: 6    End of Session Equipment Utilized During Treatment: Oxygen  OT Visit Diagnosis: Other abnormalities of gait and mobility (R26.89);Muscle weakness (generalized) (M62.81);Other symptoms and signs involving cognitive function;Pain Pain - Right/Left: Right Pain - part of body: Shoulder;Arm;Hand;Hip;Knee;Leg   Activity Tolerance Patient tolerated treatment well   Patient Left in bed;with call bell/phone within reach;with SCD's reapplied   Nurse Communication Mobility status;Other (comment) (splinting, positioning assessment)        Time: 4401-0272 OT Time Calculation (min): 39 min  Charges: OT General Charges $OT Visit: 1 Visit OT Treatments $Therapeutic Exercise: 23-37 mins $Orthotics/Prosthetics Check: 8-22 mins  Layla Maw, OTR/L   Layla Maw 03/28/2020, 4:16 PM

## 2020-03-28 NOTE — Progress Notes (Addendum)
Attestation  General exam: In no acute distress, cachectic Respiratory system: Good air movement and clear to auscultation, trach in place Cardiovascular system: S1 & S2 heard, RRR. No JVD. Gastrointestinal system: Abdomen is nondistended, soft and nontender.  Extremities: No pedal edema. Skin: No rashes, lesions or ulcers, boots in place  I have discussed with M. Gerald Dexter. Patient seen, independently examined and chart reviewed:  A/P:  Patient is suspected to go to skilled nursing facility, he will need a cuffless trach and able to sit upright for dialysis. Acute respiratory failure with hypoxia due to healthcare associated pneumonia: He started developing fevers with the respiratory failure requiring more oxygen, imaging was done that showed a new upper lobe infiltrate.  There is history of Pseudomonas his antibiotic regimen was changed to imipenem which she will continue for a total of 9 days.  Family has unreasonable expectations about his outcome, plan of care has met with them in the past and they want full comfort care and not willing to discuss with palliative care goals of care.  Sacral osteomyelitis and wound/Enterococcus bacteremia: Tunneled cath removed on 02/21/2019 when he was given a holiday line. ID recommended 6 weeks of IV bank and Flagyl.  Paroxysmal atrial fibrillation: Rate controlled continue metoprolol not on anticoagulation due to suspected GI bleed.  Ethics: Very poor outcome family has unreasonable expectations.  They do not want to discuss goals of care with palliative care  Bess Harvest MD       [facility] PROGRESS NOTE  Antero Derosia ATF:[id number] DOB: 1946-07-12 DOA: 02/14/2020 PCP: [name], MD  Status is: Inpatient  Remains inpatient appropriate because :Unsafe d/c plan, IV treatments appropriate due to intensity of illness or inability to take PO and Inpatient level of care appropriate due to severity of  illness   Dispo:  Patient From:  Home  Planned Disposition: [place]  Expected discharge date: 04/01/20  Medically stable for discharge: No-needs to transition to cuffless trach, needs to be able to sit upright and recliner to pursue dialysis in the outpatient setting, progressive leukocytosis with infection work-up underway-remains on IV antibiotics secondary to sacral osteomyelitis and decubitus and recent bacteremia   HPI: 74 y.o. male past medical history significant of chronic respiratory failure status post tracheostomy, end-stage renal disease on dialysis, chronic atrial fibrillation nonhemorrhagic stroke. Patient treated for Covid pneumonia January 2021 and had been on the vent because of progressive ARDS.  Because of lack of progression he underwent tracheostomy and [facility].  Was sent back to this facility in May due to trach site bleeding suspected hemoptysis.  He was also noted with sacral decubitus at time of presentation in May.  Since admission he has been treated for multidrug-resistant Pseudomonas pneumonia and MRSA and enterococcal bacteremia.  On 03/15/2020 patient returned to the ICU for ventilatory and pressor support he developed septic shock and developed acute renal failure requiring dialysis.  He developed sepsis physiology likely secondary to sacral decubitus ulcer with underlying osteomyelitis.    He has been weaned from pressors. PCCM is following weekly for trach weaning with the assistance of SLP.  Patient was liberated from the vent on 03/21/2020 and was transferred to the floor in 03/22/2020.  Due to the lack of insurance approval he cannot go back to [facility].  Social worker is working on Clinical research associate.  He will need to be transition to a cuffless trach and be able to sit upright in a recliner for  hemodialysis before he can discharge to skilled nursing facility.  Subjective: Patient makes eye contact but  unable to verbally communicate secondary to trach. When asked if he was feeling okay he shook his head no but was unable to elaborate given above. Unable to move arms to follow commands.  Appears to be extremely weak.  Objective: Vitals:   03/28/20 0807 03/28/20 1158  BP: 122/60 131/70  Pulse: 90   Resp: (!) 33 (!) 36  Temp: (!) 100.4 F (38 C)   SpO2: 96%     Intake/Output Summary (Last 24 hours) at 03/28/2020 1304 Last data filed at 03/28/2020 1000 Gross per 24 hour  Intake 770 ml  Output 300 ml  Net 470 ml   Filed Weights   03/27/20 0500 03/27/20 0720 03/27/20 1102  Weight: 66.5 kg 63.5 kg 63.2 kg    Exam:  Constitutional: NAD, calm, comfortable Eyes: PERRL, lids and conjunctivae normal ENMT: Mucous membranes are moist.Normal dentition.  Neck: normal, supple, no masses, no thyromegaly-midline cuffed trach in place  Respiratory: Coarse to auscultation anteriorly with bibasilar crackles.  Normal respiratory effort.  Tachypneic with some use of accessory muscles which is baseline for this patient.  Trach collar oxygen Cardiovascular: Regular rate and rhythm, no murmurs / rubs / gallops. No extremity edema. 2+ pedal pulses. No carotid bruits.  Dialysis catheter right subclavian vein. Abdomen: no tenderness, no masses palpated. No hepatosplenomegaly. Bowel sounds positive.  Colostomy in place.  Cystostomy tube in place with tube feedings infusing GU: Condom catheter in place draining yellow urine to bedside bag Musculoskeletal: no clubbing / cyanosis. No joint deformity upper and lower extremities.  Not spontaneously moving but with PROM has adequate range of motion., no contractures.  Diminished muscle tone noting too weak to follow commands with upper extremities.  Skin: no rashes, lesions, sacral decubitus not examined today Neurologic: CN 2-12 grossly intact. Sensation intact, DTR not assessed-to assess strength adequately given patient's inability to participate but he was  unable to lift either arm off the bed for assessment and also was unable to comply with request to hold up the finger on either hand. 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  Last sputum culture on 6/19 with multidrug-resistant Pseudomonas (resistant to cefepime, ceftazidime, Cipro and gentamicin) but was sensitive to imipenem Discussed with pharmacist on 7/1.  Patient had been given 1 week of imipenem and then was transitioned back to [place] since he had previously been treated with imipenem for recurrent Pseudomonas.  Unclear if this point if patient is colonizing. WBC trend is up as follows: 15,000-16,000 and now 18,000 with abnormal pulmonary exam on auscultation-last chest x-ray from 6/24 revealed bilateral airspace disease/opacities.  Have placed repeat sputum culture request will repeat chest x-ray Remains on trach collar 28% FiO2 Pulmonary critical care to follow-up and advised and transition to cuffless trach.  He remains a full code. Patient is not medically stable given need to transition to cuffless trach prior to transfer to skilled nursing facility. **Given increasing WBC trend will also obtain urinalysis and culture **Discused with supervising MD and will dc Fortaz in favor of Imipenem-UA looks dirty so await culture  Sacral osteomyelitis and HD catheter sepsis/  Enterococcal bacteremia: Tunneled catheter removal on 02/21/2020. Will require 6 weeks of IV antibiotics  per ID he will require vancomycin and Flagyl.  Paroxysmal atrial fibrillation: Currently rate controlled on metoprolol Anticoagulation was discontinued due to suspected GI bleed.  End-stage renal disease: On  HD since March 2021 status post tunneled catheter placement 02/28/2020 and as of 7/1 site unremarkable.  Chronic normocytic anemia: Due to anemia of chronic renal disease Status post1 unit of packed red blood cells  Continue erythropoietin and IV iron per renal.  History of nonhemorrhagic CVA: CT of the head showed atrophy and chronic microvascular disease. Not on anticoagulation due to possible GI bleed.  Unstageable sacral pressure ulcer with osteomyelitis: Continue wound care recommendations.  Severe protein caloric malnutrition: Continue tube feedings. Nutrition Status: Nutrition Problem: Severe Malnutrition Etiology: chronic illness (chronic respiratory failure related to COVID ARDS) Signs/Symptoms: severe muscle depletion, severe fat depletion Interventions: Prostat, Tube feeding, Juven      Data Reviewed: Basic Metabolic Panel: Recent Labs  Lab 03/24/20 0222 03/24/20 0222 03/25/20 0403 03/26/20 0221 03/27/20 0245 03/27/20 2117 03/28/20 0233  NA 136  --  135 138 139  --  136  K 3.8   < > 4.8 3.5 3.6 3.5 3.3*  CL 100  --  99 104 104  --  99  CO2 28  --  _0 --  27  GLUCOSE 112*  --  97 157* 130*  --  123*  BUN 91*  --  137* 49* 96*  --  66*  CREATININE 2.66*  --  3.46* 1.92* 2.85*  --  2.20*  CALCIUM 9.5  --  9.8 8.8* 9.3  --  9.1  MG  --   --   --   --   --  1.7  --   PHOS 3.3  --  4.3 2.4* 3.3  --  2.7   < > = values in this interval not displayed.   Liver Function Tests: Recent Labs  Lab 03/25/20 0403 03/25/20 1053 03/26/20 0221 03/27/20 0245 03/28/20 0233  AST  --  70*  --   --   --   ALT  --  16  --   --   --   ALKPHOS  --  101  --   --   --   BILITOT  --  1.1  --   --   --   PROT  --  7.8  --   --   --   ALBUMIN 2.1* 2.2* 1.9* 1.9* 1.9*   No results for input(s): LIPASE, AMYLASE in the last 168 hours. No results for input(s): AMMONIA in the last 168 hours. CBC: Recent Labs  Lab 03/24/20 0222 03/24/20 0222 03/25/20 0403  03/25/20 1541 03/26/20 0221 03/27/20 0245 03/28/20 0233  WBC 14.8*   < > 15.9* 15.9* 15.3* 16.7* 18.4*  NEUTROABS 9.6*  --  10.7*  --  10.7* 10.9* 12.5*  HGB 9.5*   < > 10.3* 8.5* 9.0* 9.6* 9.6*  HCT 30.9*   < > 31.8* 28.0* 29.2* 30.9* 31.3*  MCV 83.3   < > 81.3 82.8 82.0 81.7 82.2  PLT 256   < > 229 264 232 273 228   < > = values in this interval not displayed.   Cardiac Enzymes: No results for input(s): CKTOTAL, CKMB, CKMBINDEX, TROPONINI in the last 168 hours. BNP (last 3 results) No results for input(s): BNP in the last 8760 hours.  ProBNP (last 3 results) No results for input(s): PROBNP in the last 8760 hours.  CBG: Recent Labs  Lab  03/27/20 1636 03/27/20 1912 03/28/20 0013 03/28/20 0509 03/28/20 0817  GLUCAP 149* 125* 117* 113* 131*    Recent Results (from the past 240 hour(s))  MRSA PCR Screening     Status: None   Collection Time: 03/27/20  7:59 PM   Specimen: Nasal Mucosa; Nasopharyngeal  Result Value Ref Range Status   MRSA by PCR NEGATIVE NEGATIVE Final    Comment:        The GeneXpert MRSA Assay (FDA approved for NASAL specimens only), is one component of a comprehensive MRSA colonization surveillance program. It is not intended to diagnose MRSA infection nor to guide or monitor treatment for MRSA infections. Performed at [facility], [place] [address] [place], [place] [address]      Studies: No results found.  Scheduled Meds: . apixaban  2.5 mg Per Tube BID  . chlorhexidine  15 mL Mouth Rinse BID  . chlorhexidine gluconate (MEDLINE KIT)  15 mL Mouth Rinse BID  . Chlorhexidine Gluconate Cloth  6 each Topical Q0600  . darbepoetin (ARANESP) injection - DIALYSIS  100 mcg Intravenous Q Mon-HD  . feeding supplement (PRO-STAT SUGAR FREE 64)  30 mL Per Tube BID  . insulin aspart  0-6 Units Subcutaneous Q4H  . insulin aspart  2 Units Subcutaneous Q4H  . levothyroxine  25 mcg Per Tube Q0600  . mouth rinse  15 mL Mouth Rinse q12n4p  .  metoprolol tartrate  25 mg Per Tube BID  . metroNIDAZOLE  500 mg Per Tube Q8H  . nutrition supplement (JUVEN)  1 packet Per Tube BID BM  . pantoprazole sodium  40 mg Per Tube Daily  . phenylephrine 200 mcg / ml (NON-ED USE ONLY) injection  100 mcg Intracavernosal Once  . vancomycin  500 mg Intravenous Q M,W,F-HD   Continuous Infusions: . sodium chloride 250 mL (03/24/20 2353)  . cefTAZidime (FORTAZ)  IV 1 g (03/27/20 1702)  . feeding supplement (NEPRO CARB STEADY) 50 mL/hr at 03/28/20 1000    Principal Problem:   Enterococcal bacteremia Active Problems:   Acute respiratory failure (HCC)   Hemoptysis   Pressure injury of skin   Protein-calorie malnutrition, severe   Decubitus ulcer of sacral region, stage 4 (HCC)   Fever   Palliative care by specialist   Goals of care, counseling/discussion   DNR (do not resuscitate)     Code Status: Full Family Communication: No family at bedside    Consultants: PCCM  Procedures:  Echocardiogram  Antibiotics: Anti-infectives (From admission, onward)   Start     Dose/Rate Route Frequency Ordered Stop   03/25/20 1800  cefTAZidime (FORTAZ) 1 g in sodium chloride 0.9 % 100 mL IVPB     Discontinue    "Followed by" Linked Group Details   1 g 200 mL/hr over 30 Minutes Intravenous Every 24 hours 03/18/20 1015 04/05/20 1759   03/22/20 0830  vancomycin (VANCOREADY) IVPB 500 mg/100 mL        500 mg 100 mL/hr over 60 Minutes Intravenous  Once 03/22/20 0827 03/22/20 1014   03/20/20 1200  vancomycin (VANCOCIN) IVPB 500 mg/100 ml premix     Discontinue     500 mg 100 mL/hr over 60 Minutes Intravenous Every M-W-F (Hemodialysis) 03/19/20 0941     03/18/20 1800  meropenem (MERREM) 500 mg in sodium chloride 0.9 % 100 mL IVPB       "Followed by" Linked Group Details   500 mg 200 mL/hr over 30 Minutes Intravenous Every 24 hours 03/18/20  1015 03/25/20 0026   03/18/20 1200  vancomycin (VANCOCIN) IVPB 500 mg/100 ml premix        500 mg 100 mL/hr  over 60 Minutes Intravenous Every M-W-F (Hemodialysis) 03/18/20 0938 03/18/20 1700   03/18/20 1200  cefTAZidime (FORTAZ) 2 g in sodium chloride 0.9 % 100 mL IVPB  Status:  Discontinued        2 g 200 mL/hr over 30 Minutes Intravenous Every Mon (Hemodialysis) 03/18/20 0939 03/18/20 1015   03/07/20 1800  cefTAZidime (FORTAZ) 2 g in sodium chloride 0.9 % 100 mL IVPB  Status:  Discontinued        2 g 200 mL/hr over 30 Minutes Intravenous Every T-Th-Sa (1800) 03/06/20 1255 03/18/20 1015   03/07/20 1200  vancomycin (VANCOCIN) IVPB 500 mg/100 ml premix  Status:  Discontinued        500 mg 100 mL/hr over 60 Minutes Intravenous Every T-Th-Sa (Hemodialysis) 03/06/20 1255 03/19/20 0941   03/04/20 2000  cefTAZidime (FORTAZ) 2 g in sodium chloride 0.9 % 100 mL IVPB  Status:  Discontinued        2 g 200 mL/hr over 30 Minutes Intravenous Every M-W-F (2000) 03/01/20 1136 03/06/20 1255   03/02/20 1200  vancomycin (VANCOCIN) IVPB 500 mg/100 ml premix        500 mg 100 mL/hr over 60 Minutes Intravenous Every T-Th-Sa (Hemodialysis) 03/01/20 1131 03/02/20 1710   03/01/20 1230  vancomycin (VANCOCIN) IVPB 500 mg/100 ml premix  Status:  Discontinued        500 mg 100 mL/hr over 60 Minutes Intravenous Every M-W-F (Hemodialysis) 03/01/20 1131 03/06/20 1255   02/28/20 1110  vancomycin (VANCOREADY) IVPB 500 mg/100 mL        over 60 Minutes  Continuous PRN 02/28/20 1112 02/28/20 1110   02/28/20 1101  vancomycin (VANCOCIN) 1-5 GM/200ML-% IVPB  Status:  Discontinued       Note to Pharmacy: Lytle Butte   : cabinet override      02/28/20 1101 02/28/20 1327   02/27/20 1800  cefTAZidime (FORTAZ) 1 g in sodium chloride 0.9 % 100 mL IVPB        1 g 200 mL/hr over 30 Minutes Intravenous Every 24 hours 02/27/20 0947 03/03/20 1738   02/27/20 1200  vancomycin (VANCOCIN) IVPB 500 mg/100 ml premix        500 mg 100 mL/hr over 60 Minutes Intravenous Once 02/27/20 0829 02/27/20 1343   02/24/20 1800  vancomycin (VANCOREADY) IVPB  500 mg/100 mL        500 mg 100 mL/hr over 60 Minutes Intravenous  Once 02/24/20 1308 02/24/20 1905   02/22/20 2200  metroNIDAZOLE (FLAGYL) tablet 500 mg     Discontinue     500 mg Per Tube Every 8 hours 02/22/20 0957 04/04/20 2359   02/22/20 1700  cefTRIAXone (ROCEPHIN) 2 g in sodium chloride 0.9 % 100 mL IVPB  Status:  Discontinued        2 g 200 mL/hr over 30 Minutes Intravenous Every 24 hours 02/22/20 0957 02/27/20 0947   02/22/20 1200  vancomycin (VANCOCIN) IVPB 500 mg/100 ml premix  Status:  Discontinued        500 mg 100 mL/hr over 60 Minutes Intravenous Every T-Th-Sa (Hemodialysis) 02/20/20 1253 02/21/20 1040   02/21/20 1431  vancomycin variable dose per unstable renal function (pharmacist dosing)  Status:  Discontinued         Does not apply See admin instructions 02/21/20 1431 03/01/20 1131   02/21/20 1200  vancomycin (VANCOREADY) IVPB 500 mg/100 mL        500 mg 100 mL/hr over 60 Minutes Intravenous  Once 02/21/20 0956 02/21/20 1253   02/21/20 0714  vancomycin (VANCOCIN) 500-5 MG/100ML-% IVPB  Status:  Discontinued       Note to Pharmacy: Ashley Akin   : cabinet override      02/21/20 0714 02/21/20 1346   02/20/20 1400  vancomycin (VANCOREADY) IVPB 1250 mg/250 mL        1,250 mg 166.7 mL/hr over 90 Minutes Intravenous  Once 02/20/20 1253 02/20/20 1538   02/20/20 1200  levofloxacin (LEVAQUIN) IVPB 500 mg  Status:  Discontinued        500 mg 100 mL/hr over 60 Minutes Intravenous Every 48 hours 02/20/20 1020 02/21/20 0943   02/15/20 1545  vancomycin (VANCOCIN) IVPB 1000 mg/200 mL premix  Status:  Discontinued       "Followed by" Linked Group Details   1,000 mg 200 mL/hr over 60 Minutes Intravenous Every 24 hours 02/14/20 1530 02/14/20 1745   02/15/20 0330  ceFEPIme (MAXIPIME) 2 g in sodium chloride 0.9 % 100 mL IVPB  Status:  Discontinued        2 g 200 mL/hr over 30 Minutes Intravenous Every 12 hours 02/14/20 1531 02/14/20 1531   02/14/20 1531  ceFEPIme (MAXIPIME) 2 g in  sodium chloride 0.9 % 100 mL IVPB  Status:  Discontinued        2 g 200 mL/hr over 30 Minutes Intravenous Every 12 hours 02/14/20 1531 02/14/20 1745   02/14/20 1530  ceFEPIme (MAXIPIME) 2 g in sodium chloride 0.9 % 100 mL IVPB  Status:  Discontinued        2 g 200 mL/hr over 30 Minutes Intravenous  Once 02/14/20 1520 02/14/20 1531   02/14/20 1530  vancomycin (VANCOREADY) IVPB 1500 mg/300 mL  Status:  Discontinued       "Followed by" Linked Group Details   1,500 mg 150 mL/hr over 120 Minutes Intravenous  Once 02/14/20 1530 02/14/20 1745         Time spent: 25 minutes    Erin Hearing  [facility] Pager [phone]. If 7PM-7AM, please contact night-coverage at [email or web address] 03/28/2020, 1:04 PM  LOS: 43 days

## 2020-03-28 NOTE — Progress Notes (Signed)
Pharmacy Antibiotic Note  Shane Banks is a 74 y.o. male admitted on 02/14/2020 with CoNS/E.faecalis bacteremia/sacral osteomyelitis/pneumonia. 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 Line pulled 5/26, line holiday and had it replaced 5/28. TTE negative. Repeat BCx are negative. ID planning 6 weeks of therapy.  MD wants to transition to meropenem while awaiting urine culture.  Height: 5\' 9"  (175.3 cm) Weight: 63.2 kg (139 lb 5.3 oz) IBW/kg (Calculated) : 70.7  Temp (24hrs), Avg:99 F (37.2 C), Min:98 F (36.7 C), Max:100.4 F (38 C)  Recent Labs  Lab 03/22/20 0320 03/23/20 0358 03/24/20 0222 03/24/20 0222 03/25/20 0403 03/25/20 1541 03/26/20 0221 03/27/20 0245 03/28/20 0233  WBC 13.1*   < > 14.8*   < > 15.9* 15.9* 15.3* 16.7* 18.4*  CREATININE 2.70*   < > 2.66*  --  3.46*  --  1.92* 2.85* 2.20*  VANCORANDOM 15  --   --   --   --   --   --   --   --    < > = values in this interval not displayed.    Estimated Creatinine Clearance: 27.1 mL/min (A) (by C-G formula based on SCr of 2.2 mg/dL (H)).    Allergies  Allergen Reactions  . Aspirin Rash  . Cephalexin Other (See Comments)    dizziness  . Penicillins     Itching,swelling    Plan: D/C ceftazidime Meropenem 500mg  q24h   Shane Banks, PharmD, BCPS Clinical Pharmacist [phone] Please check AMION for all [place] numbers 03/28/2020

## 2020-03-28 NOTE — Progress Notes (Signed)
Palliative:   Thank you for this consult- discussed with attending MD. Palliative medicine has previously consulted on patient and discussed goals of care with patient's sister and HCPOA Shane Banks. At that time Shane Banks requested full scope and made it clear that she no longer wished to discuss patient's care with the Palliative medicine team.  If the family changes their mind and Palliative medicine consult is requested by family we will be happy to contact patient's family. In the mean time, we will honor the family's request not to contact them. Please let us know if our care is desired in the future. We will cancel the consult for now.   Shane Banks, AGNP-C Palliative Medicine  Please call Palliative Medicine team phone with any questions [phone]. For individual providers please see AMION.  No charge note

## 2020-03-29 LAB — RENAL FUNCTION PANEL
Albumin: 1.8 g/dL — ABNORMAL LOW (ref 3.5–5.0)
Anion gap: 10 (ref 5–15)
BUN: 101 mg/dL — ABNORMAL HIGH (ref 8–23)
CO2: 28 mmol/L (ref 22–32)
Calcium: 9.5 mg/dL (ref 8.9–10.3)
Chloride: 99 mmol/L (ref 98–111)
Creatinine, Ser: 3.11 mg/dL — ABNORMAL HIGH (ref 0.61–1.24)
GFR calc Af Amer: 22 mL/min — ABNORMAL LOW (ref >60)
GFR calc non Af Amer: 19 mL/min — ABNORMAL LOW (ref >60)
Glucose, Bld: 132 mg/dL — ABNORMAL HIGH (ref 70–99)
Phosphorus: 3.5 mg/dL (ref 2.5–4.6)
Potassium: 3.4 mmol/L — ABNORMAL LOW (ref 3.5–5.1)
Sodium: 137 mmol/L (ref 135–145)

## 2020-03-29 LAB — URINE CULTURE: Culture: 30000 — AB

## 2020-03-29 LAB — GLUCOSE, CAPILLARY
Glucose-Capillary: 115 mg/dL — ABNORMAL HIGH (ref 70–99)
Glucose-Capillary: 115 mg/dL — ABNORMAL HIGH (ref 70–99)
Glucose-Capillary: 124 mg/dL — ABNORMAL HIGH (ref 70–99)
Glucose-Capillary: 125 mg/dL — ABNORMAL HIGH (ref 70–99)
Glucose-Capillary: 126 mg/dL — ABNORMAL HIGH (ref 70–99)
Glucose-Capillary: 136 mg/dL — ABNORMAL HIGH (ref 70–99)
Glucose-Capillary: 142 mg/dL — ABNORMAL HIGH (ref 70–99)
Glucose-Capillary: 176 mg/dL — ABNORMAL HIGH (ref 70–99)

## 2020-03-29 LAB — C DIFFICILE (CDIFF) QUICK SCRN (NO PCR REFLEX)
C Diff antigen: NEGATIVE
C Diff interpretation: NOT DETECTED
C Diff toxin: NEGATIVE

## 2020-03-29 LAB — CBC WITH DIFFERENTIAL/PLATELET
Abs Immature Granulocytes: 0.13 10*3/uL — ABNORMAL HIGH (ref 0.00–0.07)
Basophils Absolute: 0.1 10*3/uL (ref 0.0–0.1)
Basophils Relative: 1 %
Eosinophils Absolute: 0.8 10*3/uL — ABNORMAL HIGH (ref 0.0–0.5)
Eosinophils Relative: 5 %
HCT: 32 % — ABNORMAL LOW (ref 39.0–52.0)
Hemoglobin: 9.8 g/dL — ABNORMAL LOW (ref 13.0–17.0)
Immature Granulocytes: 1 %
Lymphocytes Relative: 14 %
Lymphs Abs: 2.3 10*3/uL (ref 0.7–4.0)
MCH: 25.3 pg — ABNORMAL LOW (ref 26.0–34.0)
MCHC: 30.6 g/dL (ref 30.0–36.0)
MCV: 82.7 fL (ref 80.0–100.0)
Monocytes Absolute: 1.4 10*3/uL — ABNORMAL HIGH (ref 0.1–1.0)
Monocytes Relative: 9 %
Neutro Abs: 11.4 10*3/uL — ABNORMAL HIGH (ref 1.7–7.7)
Neutrophils Relative %: 70 %
Platelets: 257 10*3/uL (ref 150–400)
RBC: 3.87 MIL/uL — ABNORMAL LOW (ref 4.22–5.81)
RDW: 18.7 % — ABNORMAL HIGH (ref 11.5–15.5)
WBC: 16 10*3/uL — ABNORMAL HIGH (ref 4.0–10.5)
nRBC: 0 % (ref 0.0–0.2)

## 2020-03-29 LAB — VANCOMYCIN, RANDOM: Vancomycin Rm: 18

## 2020-03-29 LAB — CALCIUM, IONIZED: Calcium, Ionized, Serum: 5.1 mg/dL (ref 4.5–5.6)

## 2020-03-29 MED ORDER — HEPARIN SODIUM (PORCINE) 1000 UNIT/ML IJ SOLN
INTRAMUSCULAR | Status: AC
  Administered 2020-03-29: 1000 [IU]
  Filled 2020-03-29: qty 4

## 2020-03-29 MED ORDER — POTASSIUM CHLORIDE IN NACL 20-0.9 MEQ/L-% IV SOLN
INTRAVENOUS | Status: DC
  Filled 2020-03-29 (×3): qty 1000

## 2020-03-29 MED ORDER — VANCOMYCIN HCL IN DEXTROSE 500-5 MG/100ML-% IV SOLN
INTRAVENOUS | Status: AC
  Administered 2020-03-29: 500 mg
  Filled 2020-03-29: qty 100

## 2020-03-29 MED ORDER — ALBUMIN HUMAN 25 % IV SOLN
INTRAVENOUS | Status: AC
  Administered 2020-03-29: 12.5 g
  Filled 2020-03-29: qty 100

## 2020-03-29 MED ORDER — HEPARIN SODIUM (PORCINE) 1000 UNIT/ML DIALYSIS: 2000.0000 [IU] | Freq: Once | INTRAMUSCULAR | Status: DC

## 2020-03-29 MED ORDER — HEPARIN SODIUM (PORCINE) 1000 UNIT/ML IJ SOLN
INTRAMUSCULAR | Status: AC
  Filled 2020-03-29: qty 4

## 2020-03-29 MED ORDER — FLUCONAZOLE 40 MG/ML PO SUSR
100.0000 mg | Freq: Every day | ORAL | Status: AC
  Administered 2020-03-29 – 2020-03-31 (×3): 100 mg via ORAL
  Filled 2020-03-29 (×4): qty 2.5

## 2020-03-29 NOTE — Progress Notes (Addendum)
[facility] PROGRESS NOTE  Shane Banks FBX:[id number] DOB: 08-Dec-1945 DOA: 02/14/2020 PCP: Shane Border, MD  Status is: Inpatient  Remains inpatient appropriate because :Unsafe d/c plan, IV treatments appropriate due to intensity of illness or inability to take PO and Inpatient level of care appropriate due to severity of illness   Dispo:  Patient From:  Home  Planned Disposition: [facility]  Expected discharge date: 04/01/20  Medically stable for discharge: No-needs to transition to cuffless trach, needs to be able to sit upright and recliner to pursue dialysis in the outpatient setting, progressive leukocytosis with infection work-up underway-remains on IV antibiotics secondary to sacral osteomyelitis and decubitus and recent bacteremia-patient also with new diarrhea and appears to be developing dehydration so maintenance IV fluids resumed on 7/2   HPI: 74 y.o. male past medical history significant of chronic respiratory failure status post tracheostomy, end-stage renal disease on dialysis, chronic atrial fibrillation nonhemorrhagic stroke. Patient treated for Covid pneumonia January 2021 and had been on the vent because of progressive ARDS.  Because of lack of progression he underwent tracheostomy and was [facility].  Was sent back to this facility in May due to trach site bleeding suspected hemoptysis.  He was also noted with sacral decubitus at time of presentation in May.  Since admission he has been treated for multidrug-resistant Pseudomonas pneumonia and MRSA and enterococcal bacteremia.  On 03/15/2020 patient returned to the ICU for ventilatory and pressor support he developed septic shock and developed acute renal failure requiring dialysis.  He developed sepsis physiology likely secondary to sacral decubitus ulcer with underlying osteomyelitis.    He has been weaned from pressors. PCCM is following weekly for trach weaning with the  assistance of SLP.  Patient was liberated from the vent on 03/21/2020 and was transferred to the floor in 03/22/2020.  Due to the lack of insurance approval he cannot go back to [facility].  Social worker is working on Clinical research associate.  He will need to be transition to a cuffless trach and be able to sit upright in a recliner for hemodialysis before he can discharge to skilled nursing facility.  Subjective: More alert today but remains very weak. No specific complaints elicited during interaction with patient.  Objective: Vitals:   03/29/20 0720 03/29/20 0737  BP: 126/70 129/65  Pulse: 90 92  Resp: (!) 0 14  Temp: 98.2 F (36.8 C)   SpO2: 98% 99%    Intake/Output Summary (Last 24 hours) at 03/29/2020 1104 Last data filed at 03/29/2020 9191 Gross per 24 hour  Intake 1550 ml  Output 330 ml  Net 1220 ml   Filed Weights   03/27/20 0500 03/27/20 0720 03/27/20 1102  Weight: 66.5 kg 63.5 kg 63.2 kg    Exam:  Constitutional: NAD, calm, comfortable Neck: normal, supple, no masses, no thyromegaly-midline cuffed trach in place  Respiratory: Coarse to auscultation anteriorly with more diffuse rhonchi and crackles bilaterally.  Normal respiratory effort.  Tachypneic with some use of accessory muscles which is baseline for this patient.  Trach collar oxygen/FiO2 28% Cardiovascular: Regular rate and rhythm, no murmurs / rubs / gallops. No extremity edema. 2+ pedal pulses. No carotid bruits.  Dialysis catheter right subclavian vein. Abdomen: no tenderness, no masses palpated. No hepatosplenomegaly. Bowel sounds positive.  Colostomy in place with watery brown with blobby stools.  Gastrostomy tube in place with tube feedings infusing GU: Condom catheter in place draining amber-colored urine to bedside bag Musculoskeletal: no clubbing /  cyanosis. No joint deformity upper and lower extremities.  Not spontaneously moving but with PROM has adequate range of motion., no contractures.   Diminished muscle tone noting too weak to follow commands with upper extremities.  Skin: no rashes, lesions, sacral decubitus not examined today Neurologic: CN 2-12 grossly intact. Sensation intact, DTR not assessed-to assess strength adequately given patient's inability to participate but he was unable to lift either arm off the bed for assessment and also was unable to comply with request to hold up the finger on either hand. 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  Last sputum culture on 6/19 with multidrug-resistant Pseudomonas (resistant to cefepime, ceftazidime, Cipro and gentamicin) but was sensitive to imipenem WBC trend: 15 > 16 > 18 > 16 Follow-up chest x-ray revealed diffuse left-sided airspace disease with patchy right airspace disease Sputum culture ordered but not yet collected Discussed with pharmacist on 7/1.  Patient had been given 1 week of imipenem and then was transitioned back to [place] since he had previously been treated with imipenem for recurrent Pseudomonas.  Increasing white count opted to DC Fortaz in favor of meropenem Remains on trach collar 28% FiO2 Is not utilizing CPAP or BiPAP nocturnally or during the day therefore may be appropriate to transition to cuffless trach-need to contact critical care medicine/trach team on 7/5 Patient is not medically stable given need to transition to cuffless trach prior to transfer to skilled nursing facility. Given patient's overall weakness and worsening of respiratory status will order chest physiotherapy with vibration vest administered per RT  Leukocytosis/abnormal urinalysis As above regarding possible pulmonary etiology  to current leukocytosis trend Urinalysis abnormal concerning for possible UTI-urine culture positive for 30,000 colonies of yeast therefore will treat with oral Diflucan for 3 days  Diarrhea/suspected evolving dehydration Now with watery stool with blobs on colostomy-likely expected stool with ongoing antibiotic usage but with recent abnormal white count will check for C. Difficile Urine is now dark and with increased stool output will begin normal saline with potassium at 100 cc/h and follow Renal function is normal Patient does have laxatives ordered but they are as needed and have not been given recently  Sacral osteomyelitis and HD catheter sepsis/  Enterococcal bacteremia: Tunneled catheter removal on 02/21/2020. Will require 6 weeks of IV antibiotics per ID he will require vancomycin and Flagyl.  Paroxysmal atrial fibrillation: Currently rate controlled on metoprolol Anticoagulation was discontinued due to suspected GI bleed.  End-stage renal disease: On HD since March 2021 status post tunneled catheter placement 02/28/2020 and as of 7/1 site unremarkable. SNF reports patient will need to be able to sit upright in recliner to receive hemodialysis before they will accept him as a patient  Chronic normocytic anemia: Due to anemia of chronic renal disease Status post1 unit of packed red blood cells  Continue erythropoietin and IV iron per renal.  History of nonhemorrhagic CVA: CT of the head showed atrophy and chronic microvascular disease. Not on anticoagulation due to possible GI bleed.  Unstageable sacral pressure ulcer with osteomyelitis: Continue wound care recommendations.  Severe protein caloric malnutrition: Continue tube feedings. Nutrition Status: Nutrition Problem: Severe Malnutrition Etiology: chronic illness (chronic respiratory failure related to COVID ARDS) Signs/Symptoms: severe muscle depletion, severe fat depletion Interventions: Prostat, Tube feeding,  Juven  Data Reviewed: Basic Metabolic Panel: Recent Labs  Lab 03/25/20 0403 03/25/20 0403 03/26/20 0221 03/27/20 0245 03/27/20 2117 03/28/20 0233 03/29/20 0446  NA 135  --  138 139  --  136 137  K 4.8   < > 3.5 3.6 3.5 3.3* 3.4*  CL 99  --  104 104  --  99 99  CO2 24  --  26 27  --  27 28  GLUCOSE 97  --  157* 130*  --  123* 132*  BUN 137*  --  49* 96*  --  66* 101*  CREATININE 3.46*  --  1.92* 2.85*  --  2.20* 3.11*  CALCIUM 9.8  --  8.8* 9.3  --  9.1 9.5  MG  --   --   --   --  1.7  --   --   PHOS 4.3  --  2.4* 3.3  --  2.7 3.5   < > = values in this interval not displayed.   Liver Function Tests: Recent Labs  Lab 03/25/20 1053 03/26/20 0221 03/27/20 0245 03/28/20 0233 03/29/20 0446  AST 70*  --   --   --   --   ALT 16  --   --   --   --   ALKPHOS 101  --   --   --   --   BILITOT 1.1  --   --   --   --   PROT 7.8  --   --   --   --   ALBUMIN 2.2* 1.9* 1.9* 1.9* 1.8*   No results for input(s): LIPASE, AMYLASE in the last 168 hours. No results for input(s): AMMONIA in the last 168 hours. CBC: Recent Labs  Lab 03/25/20 0403 03/25/20 0403 03/25/20 1541 03/26/20 0221 03/27/20 0245 03/28/20 0233 03/29/20 0446  WBC 15.9*   < > 15.9* 15.3* 16.7* 18.4* 16.0*  NEUTROABS 10.7*  --   --  10.7* 10.9* 12.5* 11.4*  HGB 10.3*   < > 8.5* 9.0* 9.6* 9.6* 9.8*  HCT 31.8*   < > 28.0* 29.2* 30.9* 31.3* 32.0*  MCV 81.3   < > 82.8 82.0 81.7 82.2 82.7  PLT 229   < > 264 232 273 228 257   < > = values in this interval not displayed.   Cardiac Enzymes: No results for input(s): CKTOTAL, CKMB, CKMBINDEX, TROPONINI in the last 168 hours. BNP (last 3 results) No results for input(s): BNP in the last 8760 hours.  ProBNP (last 3 results) No results for input(s): PROBNP in the last 8760 hours.  CBG: Recent Labs  Lab 03/28/20 1620 03/28/20 2012 03/29/20 0016 03/29/20 0418 03/29/20 0718  GLUCAP 155* 166* 124* 136* 125*    Recent Results (from the past 240 hour(s))   MRSA PCR Screening     Status: None   Collection Time: 03/27/20  7:59 PM   Specimen: Nasal Mucosa; Nasopharyngeal  Result Value Ref Range Status   MRSA by PCR NEGATIVE NEGATIVE Final    Comment:        The GeneXpert MRSA Assay (FDA approved for NASAL specimens only), is one component of a comprehensive MRSA colonization surveillance program. It is not intended to diagnose MRSA infection nor to guide or monitor treatment for MRSA infections. Performed at [facility], [place] [address], [place], [place] [address]   Culture, Urine     Status: None (Preliminary result)   Collection Time: 03/28/20  2:10 PM   Specimen:  Urine, Random  Result Value Ref Range Status   Specimen Description URINE, RANDOM  Final   Special Requests NONE  Final   Culture   Final    CULTURE REINCUBATED FOR BETTER GROWTH Performed at [facility], [address], [place], [place] [address]    Report Status PENDING  Incomplete     Studies: DG Chest 1 View  Result Date: 03/28/2020 CLINICAL DATA:  Bilateral pneumonia EXAM: CHEST  1 VIEW COMPARISON:  03/21/2020 FINDINGS: Tracheostomy tube and right dialysis catheter remain in place, unchanged. Heart is borderline in size. Diffuse left lung airspace disease and patchy airspace disease throughout the right lung. Possible small left effusion. No change since prior study. IMPRESSION: No significant change since prior study. Electronically Signed   By: Rolm Baptise M.D.   On: 03/28/2020 16:16    Scheduled Meds: . apixaban  2.5 mg Per Tube BID  . chlorhexidine  15 mL Mouth Rinse BID  . chlorhexidine gluconate (MEDLINE KIT)  15 mL Mouth Rinse BID  . Chlorhexidine Gluconate Cloth  6 each Topical Q0600  . darbepoetin (ARANESP) injection - DIALYSIS  100 mcg Intravenous Q Mon-HD  . feeding supplement (PRO-STAT SUGAR FREE 64)  30 mL Per Tube BID  . insulin aspart  0-6 Units Subcutaneous Q4H  . insulin aspart  2 Units Subcutaneous Q4H  . levothyroxine  25 mcg  Per Tube Q0600  . mouth rinse  15 mL Mouth Rinse q12n4p  . metoprolol tartrate  25 mg Per Tube BID  . metroNIDAZOLE  500 mg Per Tube Q8H  . nutrition supplement (JUVEN)  1 packet Per Tube BID BM  . pantoprazole sodium  40 mg Per Tube Daily  . phenylephrine 200 mcg / ml (NON-ED USE ONLY) injection  100 mcg Intracavernosal Once  . vancomycin  500 mg Intravenous Q M,W,F-HD   Continuous Infusions: . sodium chloride 250 mL (03/24/20 2353)  . 0.9 % NaCl with KCl 20 mEq / L    . feeding supplement (NEPRO CARB STEADY) 50 mL/hr at 03/28/20 1800  . meropenem (MERREM) IV 500 mg (03/28/20 2010)    Principal Problem:   Enterococcal bacteremia Active Problems:   Acute respiratory failure (HCC)   Hemoptysis   Pressure injury of skin   Protein-calorie malnutrition, severe   Decubitus ulcer of sacral region, stage 4 (HCC)   Fever   Palliative care by specialist   Goals of care, counseling/discussion   DNR (do not resuscitate)     Code Status: Full Family Communication: [name] sister 03/29/20    Consultants: PCCM  Procedures:  Echocardiogram  Antibiotics: Anti-infectives (From admission, onward)   Start     Dose/Rate Route Frequency Ordered Stop   03/28/20 2000  meropenem (MERREM) 500 mg in sodium chloride 0.9 % 100 mL IVPB     Discontinue     500 mg 200 mL/hr over 30 Minutes Intravenous Every 24 hours 03/28/20 1623     03/25/20 1800  cefTAZidime (FORTAZ) 1 g in sodium chloride 0.9 % 100 mL IVPB  Status:  Discontinued       "Followed by" Linked Group Details   1 g 200 mL/hr over 30 Minutes Intravenous Every 24 hours 03/18/20 1015 03/28/20 1613   03/22/20 0830  vancomycin (VANCOREADY) IVPB 500 mg/100 mL        500 mg 100 mL/hr over 60 Minutes Intravenous  Once 03/22/20 0827 03/22/20 1014   03/20/20 1200  vancomycin (VANCOCIN) IVPB 500 mg/100 ml premix  Discontinue     500 mg 100 mL/hr over 60 Minutes Intravenous Every M-W-F (Hemodialysis) 03/19/20 0941     03/18/20  1800  meropenem (MERREM) 500 mg in sodium chloride 0.9 % 100 mL IVPB       "Followed by" Linked Group Details   500 mg 200 mL/hr over 30 Minutes Intravenous Every 24 hours 03/18/20 1015 03/25/20 0026   03/18/20 1200  vancomycin (VANCOCIN) IVPB 500 mg/100 ml premix        500 mg 100 mL/hr over 60 Minutes Intravenous Every M-W-F (Hemodialysis) 03/18/20 0938 03/18/20 1700   03/18/20 1200  cefTAZidime (FORTAZ) 2 g in sodium chloride 0.9 % 100 mL IVPB  Status:  Discontinued        2 g 200 mL/hr over 30 Minutes Intravenous Every Mon (Hemodialysis) 03/18/20 0939 03/18/20 1015   03/07/20 1800  cefTAZidime (FORTAZ) 2 g in sodium chloride 0.9 % 100 mL IVPB  Status:  Discontinued        2 g 200 mL/hr over 30 Minutes Intravenous Every T-Th-Sa (1800) 03/06/20 1255 03/18/20 1015   03/07/20 1200  vancomycin (VANCOCIN) IVPB 500 mg/100 ml premix  Status:  Discontinued        500 mg 100 mL/hr over 60 Minutes Intravenous Every T-Th-Sa (Hemodialysis) 03/06/20 1255 03/19/20 0941   03/04/20 2000  cefTAZidime (FORTAZ) 2 g in sodium chloride 0.9 % 100 mL IVPB  Status:  Discontinued        2 g 200 mL/hr over 30 Minutes Intravenous Every M-W-F (2000) 03/01/20 1136 03/06/20 1255   03/02/20 1200  vancomycin (VANCOCIN) IVPB 500 mg/100 ml premix        500 mg 100 mL/hr over 60 Minutes Intravenous Every T-Th-Sa (Hemodialysis) 03/01/20 1131 03/02/20 1710   03/01/20 1230  vancomycin (VANCOCIN) IVPB 500 mg/100 ml premix  Status:  Discontinued        500 mg 100 mL/hr over 60 Minutes Intravenous Every M-W-F (Hemodialysis) 03/01/20 1131 03/06/20 1255   02/28/20 1110  vancomycin (VANCOREADY) IVPB 500 mg/100 mL        over 60 Minutes  Continuous PRN 02/28/20 1112 02/28/20 1110   02/28/20 1101  vancomycin (VANCOCIN) 1-5 GM/200ML-% IVPB  Status:  Discontinued       Note to Pharmacy: Lytle Butte   : cabinet override      02/28/20 1101 02/28/20 1327   02/27/20 1800  cefTAZidime (FORTAZ) 1 g in sodium chloride 0.9 % 100 mL  IVPB        1 g 200 mL/hr over 30 Minutes Intravenous Every 24 hours 02/27/20 0947 03/03/20 1738   02/27/20 1200  vancomycin (VANCOCIN) IVPB 500 mg/100 ml premix        500 mg 100 mL/hr over 60 Minutes Intravenous Once 02/27/20 0829 02/27/20 1343   02/24/20 1800  vancomycin (VANCOREADY) IVPB 500 mg/100 mL        500 mg 100 mL/hr over 60 Minutes Intravenous  Once 02/24/20 1308 02/24/20 1905   02/22/20 2200  metroNIDAZOLE (FLAGYL) tablet 500 mg     Discontinue     500 mg Per Tube Every 8 hours 02/22/20 0957 04/04/20 2359   02/22/20 1700  cefTRIAXone (ROCEPHIN) 2 g in sodium chloride 0.9 % 100 mL IVPB  Status:  Discontinued        2 g 200 mL/hr over 30 Minutes Intravenous Every 24 hours 02/22/20 0957 02/27/20 0947   02/22/20 1200  vancomycin (VANCOCIN) IVPB 500 mg/100 ml premix  Status:  Discontinued  500 mg 100 mL/hr over 60 Minutes Intravenous Every T-Th-Sa (Hemodialysis) 02/20/20 1253 02/21/20 1040   02/21/20 1431  vancomycin variable dose per unstable renal function (pharmacist dosing)  Status:  Discontinued         Does not apply See admin instructions 02/21/20 1431 03/01/20 1131   02/21/20 1200  vancomycin (VANCOREADY) IVPB 500 mg/100 mL        500 mg 100 mL/hr over 60 Minutes Intravenous  Once 02/21/20 0956 02/21/20 1253   02/21/20 0714  vancomycin (VANCOCIN) 500-5 MG/100ML-% IVPB  Status:  Discontinued       Note to Pharmacy: Ashley Akin   : cabinet override      02/21/20 0714 02/21/20 1346   02/20/20 1400  vancomycin (VANCOREADY) IVPB 1250 mg/250 mL        1,250 mg 166.7 mL/hr over 90 Minutes Intravenous  Once 02/20/20 1253 02/20/20 1538   02/20/20 1200  levofloxacin (LEVAQUIN) IVPB 500 mg  Status:  Discontinued        500 mg 100 mL/hr over 60 Minutes Intravenous Every 48 hours 02/20/20 1020 02/21/20 0943   02/15/20 1545  vancomycin (VANCOCIN) IVPB 1000 mg/200 mL premix  Status:  Discontinued       "Followed by" Linked Group Details   1,000 mg 200 mL/hr over 60  Minutes Intravenous Every 24 hours 02/14/20 1530 02/14/20 1745   02/15/20 0330  ceFEPIme (MAXIPIME) 2 g in sodium chloride 0.9 % 100 mL IVPB  Status:  Discontinued        2 g 200 mL/hr over 30 Minutes Intravenous Every 12 hours 02/14/20 1531 02/14/20 1531   02/14/20 1531  ceFEPIme (MAXIPIME) 2 g in sodium chloride 0.9 % 100 mL IVPB  Status:  Discontinued        2 g 200 mL/hr over 30 Minutes Intravenous Every 12 hours 02/14/20 1531 02/14/20 1745   02/14/20 1530  ceFEPIme (MAXIPIME) 2 g in sodium chloride 0.9 % 100 mL IVPB  Status:  Discontinued        2 g 200 mL/hr over 30 Minutes Intravenous  Once 02/14/20 1520 02/14/20 1531   02/14/20 1530  vancomycin (VANCOREADY) IVPB 1500 mg/300 mL  Status:  Discontinued       "Followed by" Linked Group Details   1,500 mg 150 mL/hr over 120 Minutes Intravenous  Once 02/14/20 1530 02/14/20 1745        Time spent: 25 minutes    Erin Hearing ANP [facility] Pager [phone]. If 7PM-7AM, please contact night-coverage at [email or web address] 03/29/2020, 11:04 AM  LOS: 44 days

## 2020-03-29 NOTE — Progress Notes (Signed)
Occupational Therapy Treatment Patient Details Name: Shane Banks MRN: [id number] DOB: 1946/06/08 Today's Date: 03/29/2020    History of present illness 74 yo m with PMHx of afib with RVR, BPH, stroke, and COVID ARDS in January requiring trach and LTACH placement at [facility]. Had been tolerating trach collar, but on 5/19 developed hemoptysis and hypoxemic respiratory failure requiring vent. Transferred to [facility] on 5/19.  5/19 back on vent, 6/7 transitioned to [facility]   OT comments  After consultation with [employer] and adjustment of R resting hand splint, continued training of staff and preparing for discharge of OT interventions with splinting protocol. Provided PROM to B UE prior to donning splints with improvements noted in swelling when UEs positioned on pillows. NT present and demonstrated ability to don resting hand splint. Educated nursing staff on PROM, positioning, and wearing schedule of splints with order updated to reflect. Pt able to tolerate 3-4 hours of B resting hand splint wear before OT removed prior to dialysis. Minor indentations noted that resolved within 5 minutes. Pt continues to require Total A for ADLs, unable to follow commands at this time. Plan to re-assess splinting carryover and pt response next week and discharge if pt tolerating well.    Follow Up Recommendations  SNF;LTACH;Supervision/Assistance - 24 hour    Equipment Recommendations  Other (comment) (TBD)    Recommendations for Other Services      Precautions / Restrictions Precautions Precautions: Fall;Other (comment) Precaution Comments: trach collar, PEG Restrictions Weight Bearing Restrictions: No       Mobility Bed Mobility                  Transfers                      Balance                                           ADL either performed or assessed with clinical judgement   ADL Overall ADL's : Needs assistance/impaired         Upper Body  Bathing: Total assistance   Lower Body Bathing: Total assistance   Upper Body Dressing : Total assistance                     General ADL Comments: Pt continues with Total A for all ADLs completed with NT this AM     Vision   Vision Assessment?: Vision impaired- to be further tested in functional context;Yes   Perception     Praxis      Cognition Arousal/Alertness: Awake/alert;Lethargic Behavior During Therapy: Flat affect Overall Cognitive Status: Difficult to assess                                 General Comments: Pt will respond to questions mouthing yes/no, but unable to follow commands to move extremities or squeeze hands when prompted        Exercises     Shoulder Instructions       General Comments Consultation with [employer] and adjustment of R resting hand splint, able to be donned today. Performed PROM of  B UE prior to donning of splints, repositioned and elevated on pillows to decrease edema. Pt tolerated 3-4 hours before OT removed them prior to pt transport to dialysis. Educated  NT, RN on splinting and stretching recommendations with NT donning R splint. Updated orders and splint wearing schedule in pt room    Pertinent Vitals/ Pain       Pain Assessment: Faces Faces Pain Scale: Hurts little more Pain Location: generalized during ROM, mobility Pain Descriptors / Indicators: Discomfort;Grimacing;Guarding Pain Intervention(s): Limited activity within patient's tolerance;Monitored during session;Repositioned  Home Living                                          Prior Functioning/Environment              Frequency  Min 2X/week        Progress Toward Goals  OT Goals(current goals can now be found in the care plan section)  Progress towards OT goals: Not progressing toward goals - comment (not functionally progressing but splinting protocol establis)  Acute Rehab OT Goals Patient Stated Goal: not stated OT  Goal Formulation: Patient unable to participate in goal setting Time For Goal Achievement: 04/02/20 Potential to Achieve Goals: Fair ADL Goals Pt Will Perform Upper Body Bathing: with max assist;bed level Additional ADL Goal #1: Demonstrate increased of R shoulder P/AAROM to 75 degrees after using mobilization techniques to increase functional use RUE Additional ADL Goal #2: Pt will complete hand to mouth pattern with mod A using L UE inpreparation for ADL tasks Additional ADL Goal #3: pt will tolerate bilateral resting hand splints 4 hours on/off schedule to maintain a stretch in hands and increase functional use.  Plan Discharge plan remains appropriate    Co-evaluation                 AM-PAC OT "6 Clicks" Daily Activity     Outcome Measure   Help from another person eating meals?: Total Help from another person taking care of personal grooming?: Total Help from another person toileting, which includes using toliet, bedpan, or urinal?: Total Help from another person bathing (including washing, rinsing, drying)?: Total Help from another person to put on and taking off regular upper body clothing?: Total Help from another person to put on and taking off regular lower body clothing?: Total 6 Click Score: 6    End of Session Equipment Utilized During Treatment: Oxygen  OT Visit Diagnosis: Other abnormalities of gait and mobility (R26.89);Muscle weakness (generalized) (M62.81);Other symptoms and signs involving cognitive function;Pain Pain - Right/Left: Right Pain - part of body: Shoulder;Arm;Hand;Hip;Knee;Leg   Activity Tolerance Patient tolerated treatment well   Patient Left in bed;with call bell/phone within reach;with SCD's reapplied   Nurse Communication Other (comment);Mobility status (splinting, positioning)        Time: 3662-9476 OT Time Calculation (min): 25 min  Charges: OT General Charges $OT Visit: 1 Visit OT Treatments $Orthotics/Prosthetics Check:  23-37 mins  Layla Maw, OTR/L   Layla Maw 03/29/2020, 12:54 PM

## 2020-03-29 NOTE — Progress Notes (Signed)
[facility] Progress Note  Subjective: seen in HD, no c/o's  Vitals:   03/29/20 0338 03/29/20 0720 03/29/20 0737 03/29/20 1133  BP: 123/68 126/70 129/65 122/74  Pulse: 91 90 92 89  Resp: (!) 37 (!) 0 14 (!) 25  Temp:  98.2 F (36.8 C)    TempSrc:  Oral    SpO2: 96% 98% 99% 98%  Weight:      Height:        Exam:  eyes opens, tracks and nods head to some questions  no jvd  Chest coarse BS bilat  Cor reg no RG  Abd soft ntnd no ascites, +llq ostomy, +peg tube   Ext 1-2+ bilat UE and hip edema   Nonfocal, +gen'd weakness       Summary:came from [facility] to [facility] after COVID hospitalization which included mech ventilation and trach placement. At [facility] had severe infections/ bacteremia/ PNA and developed AKI felt due to IV gent toxicity requiring initiation of HD 12/16/19. Admitted to ICU [facility] for trach bleed on 5/19. Was getting MWF HD at [facility] prior to admit here.   OP HD: started March 2021 at [facility] as above MWF Getting HD here 3.5- 4h, UF 0.5- 1.5 L, hep 2000 prn admit 62.8kg > range 56.9- 64.7kg here    Assessment/ Plan: 1. 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 resp status tenous- back on vent this past weekend-  Now back on TC.  Full code now.  CXR bilat infiltrates, continues on broad-spec IV abx. Concern is for ongoing aspiration 2. Enterococcus/ MRSE bacteremia HD cath sepsis - TDC removed 5/26, per ID getting 6 wks vanc/ fortaz/ flagyl and meropenam for polymicrobial cath sepsis and sacral osteomyelitis.  3. Sacral osteo/ decub stage IV - as per #2 4. ESRD - on HD since mid March 2021. HD MWF at [facility]. S/P new TDC 6/2 by IR. Next HD today.  5. BP/vol - edema worse now, wt's are stable but likely is losing body wt, limited UF because of hypotension on dialysis. Poor CRRT candidate. Max UF w/ HD today.  7. AMS - variable, seems overall improved 8. Atrial fib - per primary, on  eliquis 2.5 BID 9. Anemia ckd - transfuse prn, Hb remains 9- 10 range. on ESA 10. Hypercalcemia: w/ low PTH, prob from immobility,  improved. Cont low 2.0 Ca++ bath , PTH 29.  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 DNR/ very poor prognosis-  LTACH only option for disposition given needs but apparently not a candidate. Family asked not to be contacted by PCT anymore.   Rob Shakeeta Godette 03/29/2020, 1:13 PM   Recent Labs  Lab 03/28/20 0233 03/29/20 0446  K 3.3* 3.4*  BUN 66* 101*  CREATININE 2.20* 3.11*  CALCIUM 9.1 9.5  PHOS 2.7 3.5  HGB 9.6* 9.8*   Inpatient medications: . apixaban  2.5 mg Per Tube BID  . chlorhexidine  15 mL Mouth Rinse BID  . chlorhexidine gluconate (MEDLINE KIT)  15 mL Mouth Rinse BID  . Chlorhexidine Gluconate Cloth  6 each Topical Q0600  . darbepoetin (ARANESP) injection - DIALYSIS  100 mcg Intravenous Q Mon-HD  . feeding supplement (PRO-STAT SUGAR FREE 64)  30 mL Per Tube BID  . [START ON 03/30/2020] heparin  2,000 Units Dialysis Once in dialysis  . insulin aspart  0-6 Units Subcutaneous Q4H  . insulin aspart  2 Units Subcutaneous Q4H  . levothyroxine  25 mcg Per  Tube Q0600  . mouth rinse  15 mL Mouth Rinse q12n4p  . metoprolol tartrate  25 mg Per Tube BID  . metroNIDAZOLE  500 mg Per Tube Q8H  . nutrition supplement (JUVEN)  1 packet Per Tube BID BM  . pantoprazole sodium  40 mg Per Tube Daily  . phenylephrine 200 mcg / ml (NON-ED USE ONLY) injection  100 mcg Intracavernosal Once  . vancomycin  500 mg Intravenous Q M,W,F-HD   . sodium chloride 250 mL (03/24/20 2353)  . 0.9 % NaCl with KCl 20 mEq / L 100 mL/hr at 03/29/20 1203  . feeding supplement (NEPRO CARB STEADY) 1,000 mL (03/29/20 1205)  . meropenem (MERREM) IV 500 mg (03/28/20 2010)   sodium chloride, acetaminophen (TYLENOL) oral liquid 160 mg/5 mL, docusate, fentaNYL (SUBLIMAZE) injection, heparin, ipratropium-albuterol, metoprolol tartrate, polyethylene glycol

## 2020-03-30 LAB — CBC WITH DIFFERENTIAL/PLATELET
Abs Immature Granulocytes: 0.12 10*3/uL — ABNORMAL HIGH (ref 0.00–0.07)
Basophils Absolute: 0.1 10*3/uL (ref 0.0–0.1)
Basophils Relative: 1 %
Eosinophils Absolute: 0.5 10*3/uL (ref 0.0–0.5)
Eosinophils Relative: 3 %
HCT: 32.2 % — ABNORMAL LOW (ref 39.0–52.0)
Hemoglobin: 9.8 g/dL — ABNORMAL LOW (ref 13.0–17.0)
Immature Granulocytes: 1 %
Lymphocytes Relative: 16 %
Lymphs Abs: 2.8 10*3/uL (ref 0.7–4.0)
MCH: 25.1 pg — ABNORMAL LOW (ref 26.0–34.0)
MCHC: 30.4 g/dL (ref 30.0–36.0)
MCV: 82.6 fL (ref 80.0–100.0)
Monocytes Absolute: 1.5 10*3/uL — ABNORMAL HIGH (ref 0.1–1.0)
Monocytes Relative: 9 %
Neutro Abs: 12.9 10*3/uL — ABNORMAL HIGH (ref 1.7–7.7)
Neutrophils Relative %: 70 %
Platelets: 225 10*3/uL (ref 150–400)
RBC: 3.9 MIL/uL — ABNORMAL LOW (ref 4.22–5.81)
RDW: 19.1 % — ABNORMAL HIGH (ref 11.5–15.5)
WBC: 18 10*3/uL — ABNORMAL HIGH (ref 4.0–10.5)
nRBC: 0 % (ref 0.0–0.2)

## 2020-03-30 LAB — RENAL FUNCTION PANEL
Albumin: 2.2 g/dL — ABNORMAL LOW (ref 3.5–5.0)
Anion gap: 11 (ref 5–15)
BUN: 43 mg/dL — ABNORMAL HIGH (ref 8–23)
CO2: 25 mmol/L (ref 22–32)
Calcium: 9.1 mg/dL (ref 8.9–10.3)
Chloride: 102 mmol/L (ref 98–111)
Creatinine, Ser: 1.91 mg/dL — ABNORMAL HIGH (ref 0.61–1.24)
GFR calc Af Amer: 40 mL/min — ABNORMAL LOW (ref >60)
GFR calc non Af Amer: 34 mL/min — ABNORMAL LOW (ref >60)
Glucose, Bld: 133 mg/dL — ABNORMAL HIGH (ref 70–99)
Phosphorus: 1.9 mg/dL — ABNORMAL LOW (ref 2.5–4.6)
Potassium: 4.1 mmol/L (ref 3.5–5.1)
Sodium: 138 mmol/L (ref 135–145)

## 2020-03-30 LAB — GLUCOSE, CAPILLARY
Glucose-Capillary: 128 mg/dL — ABNORMAL HIGH (ref 70–99)
Glucose-Capillary: 118 mg/dL — ABNORMAL HIGH (ref 70–99)
Glucose-Capillary: 120 mg/dL — ABNORMAL HIGH (ref 70–99)
Glucose-Capillary: 137 mg/dL — ABNORMAL HIGH (ref 70–99)
Glucose-Capillary: 133 mg/dL — ABNORMAL HIGH (ref 70–99)

## 2020-03-30 MED ORDER — FREE WATER
200.0000 mL | Freq: Four times a day (QID) | Status: DC
  Administered 2020-03-30 – 2020-03-31 (×4): 200 mL

## 2020-03-30 NOTE — Progress Notes (Signed)
[facility] PROGRESS NOTE    Progress Note  [name]  IRS:[id number] DOB: June 29, 1946 DOA: 02/14/2020 PCP: [name], MD     Brief Narrative:   Shane Banks is an 74 y.o. male past medical history significant of chronic respiratory failure status post tracheostomy, end-stage renal disease on dialysis, chronic atrial fibrillation nonhemorrhagic stroke who was admitted from [facility] for evaluation of trach site bleeding.  Patient had a recent Covid pneumonia and has been on the vent because of progressive ARDS.  Because of poor improvement in the event underwent tracheostomy and discharged to [facility].  He recently had a multidrug-resistant Pseudomonas and trichomonas bacteremia.  On 03/15/2020 patient returned to the ICU for ventilatory and pressor support he developed septic shock and developed acute renal failure requiring dialysis.  On 02/14/2020 developed hemoptysis and was transferred to [facility] for evaluation.  He is also noted to have a sacral decubitus ulcer osteomyelitis leading to sepsis bacteremia.  He is now off pressors but continues to be vent dependent with a tracheostomy.  PCCM is following him trying to wean off trach collar they are following him weekly.  Patient was taken off the vent on 03/21/2020.  Transfer to the floor in 03/22/2020.  Due to the lack of insurance approval he cannot go back to [facility].  Social worker is working on Clinical research associate.  He will need to be on a cuffless trach's to be effective.  Assessment/Plan:   Acute on chronic hypoxemic respiratory failure requiring tracheostomy with prolonged mechanical ventilation: Pulmonary critical care to follow-up and advised and transition to cuffless trach.  He remains a full code. Continue IV imipenem for possible Pseudomonas pneumonia. He has defervesced, still with mild leukocytosis. During critical care following he needed a cuffless trach to transfer to skilled nursing  facility. Overall he has a poor prognosis, despite this he remains full code.  New Acute kidney injury: Likely prerenal azotemia he was started on IV fluid hydration and his creatinine is improving. KVO IV fluid will start free water flushes Check a basic metabolic panel.  New onset of diarrhea: C. difficile PCR is negative, likely due to feedings in the setting of med apparent.  Sacral osteomyelitis and HD catheter sepsis/  Enterococcal bacteremia: Tunneled catheter removal on 02/21/2020. Will require 6 weeks of IV antibiotics per ID he will require vancomycin and Flagyl.  Paroxysmal atrial fibrillation: Currently rate controlled anticoagulation was discontinued due to suspected GI bleed. Currently on metoprolol.  End-stage renal disease: On HD since March 2021 status post tunneled catheter placement 02/28/2020.  Chronic normocytic anemia: Due to anemia of chronic renal disease transfuse 1 unit of packed red blood cells continue erythropoietin and IV iron per renal.  History of nonhemorrhagic CVA: CT of the head showed atrophy and chronic microvascular disease. Not on anticoagulation due to possible GI bleed.  Unstageable sacral pressure ulcer with osteomyelitis: Continue wound care recommendations.  Severe protein caloric malnutrition: Continue tube feedings.  Ethics: We will consult palliative care to discuss with the family goals of care.  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 (Active)  02/14/20 1906  Location: Sacrum  Location Orientation: Medial  Staging: Stage 4 - Full thickness tissue loss with exposed bone, tendon or muscle.  Wound Description (Comments):   Present on Admission: Yes    Estimated body mass index is 19.47 kg/m as calculated  from the following:   Height as of this encounter: 5' 9"  (1.753 m).    Weight as of this encounter: 59.8 kg. Malnutrition Type:  Nutrition Problem: Severe Malnutrition Etiology: chronic illness (chronic respiratory failure related to COVID ARDS)   Malnutrition Characteristics:  Signs/Symptoms: severe muscle depletion, severe fat depletion   Nutrition Interventions:  Interventions: Prostat, Tube feeding, Juven    DVT prophylaxis: lovenox  Family Communication:none Status is: Inpatient  Remains inpatient appropriate because:Hemodynamically unstable   Dispo:  Patient From:    Planned Disposition: [facility]  Expected discharge date: 04/01/20  Medically stable for discharge: No    Code Status:     Code Status Orders  (From admission, onward)         Start     Ordered   03/15/20 1935  Full code  Continuous        03/15/20 1934        Code Status History    Date Active Date Inactive Code Status Order ID Comments User Context   02/14/2020 1749 03/15/2020 1934 DNR [id number]  Corey Harold, NP ED   12/12/2019 0739 02/14/2020 1420 DNR [id number]  Agnes Lawrence Inpatient   11/27/2019 0814 12/12/2019 0739 Full Code [id number]  Agnes Lawrence Inpatient   11/22/2019 0059 11/27/2019 0814 DNR [id number]  Maudry Diego Inpatient   Advance Care Planning Activity        IV Access:    Peripheral IV   Procedures and diagnostic studies:   DG Chest 1 View  Result Date: 03/28/2020 CLINICAL DATA:  Bilateral pneumonia EXAM: CHEST  1 VIEW COMPARISON:  03/21/2020 FINDINGS: Tracheostomy tube and right dialysis catheter remain in place, unchanged. Heart is borderline in size. Diffuse left lung airspace disease and patchy airspace disease throughout the right lung. Possible small left effusion. No change since prior study. IMPRESSION: No significant change since prior study. Electronically Signed   By: Rolm Baptise M.D.   On: 03/28/2020 16:16     Medical Consultants:    None.  Anti-Infectives:   IV Vanco, Rocephin and  Flagyl  Subjective:    Camran Keady relates his shortness of breath is resolved.  Objective:    Vitals:   03/30/20 0500 03/30/20 0600 03/30/20 0805 03/30/20 0831  BP:   119/66   Pulse:  89  87  Resp:  20 (!) 37 20  Temp:    98.1 F (36.7 C)  TempSrc:    Axillary  SpO2:  92%  95%  Weight: 59.8 kg     Height:       SpO2: 95 % O2 Flow Rate (L/min): 5 L/min FiO2 (%): 28 %   Intake/Output Summary (Last 24 hours) at 03/30/2020 1022 Last data filed at 03/30/2020 0400 Gross per 24 hour  Intake 1400 ml  Output 2250 ml  Net -850 ml   Filed Weights   03/29/20 1223 03/29/20 1634 03/30/20 0500  Weight: 63.4 kg 60 kg 59.8 kg    Exam: General exam: In no acute distress. Respiratory system: Good air movement and crackles bilaterally, trach in place Cardiovascular system: S1 & S2 heard, RRR. No JVD. Gastrointestinal system: Abdomen is nondistended, soft and nontender.  Central nervous system: Alert and oriented. No focal neurological deficits. Extremities: Boot in place Skin: No rashes, lesions or ulcers   Data Reviewed:    Labs: Basic Metabolic Panel: Recent Labs  Lab 03/26/20 0221 03/26/20 0221 03/27/20 0245 03/27/20 0245 03/27/20 2117 03/27/20 2117 03/28/20 0233 03/28/20 0233 03/29/20  7510 03/30/20 0347  NA 138  --  139  --   --   --  136  --  137 138  K 3.5   < > 3.6   < > 3.5   < > 3.3*   < > 3.4* 4.1  CL 104  --  104  --   --   --  99  --  99 102  CO2 26  --  27  --   --   --  27  --  28 25  GLUCOSE 157*  --  130*  --   --   --  123*  --  132* 133*  BUN 49*  --  96*  --   --   --  66*  --  101* 43*  CREATININE 1.92*  --  2.85*  --   --   --  2.20*  --  3.11* 1.91*  CALCIUM 8.8*  --  9.3  --   --   --  9.1  --  9.5 9.1  MG  --   --   --   --  1.7  --   --   --   --   --   PHOS 2.4*  --  3.3  --   --   --  2.7  --  3.5 1.9*   < > = values in this interval not displayed.   GFR Estimated Creatinine Clearance: 29.6 mL/min (A) (by C-G formula based on SCr of  1.91 mg/dL (H)). Liver Function Tests: Recent Labs  Lab 03/25/20 1053 03/25/20 1053 03/26/20 0221 03/27/20 0245 03/28/20 0233 03/29/20 0446 03/30/20 0347  AST 70*  --   --   --   --   --   --   ALT 16  --   --   --   --   --   --   ALKPHOS 101  --   --   --   --   --   --   BILITOT 1.1  --   --   --   --   --   --   PROT 7.8  --   --   --   --   --   --   ALBUMIN 2.2*   < > 1.9* 1.9* 1.9* 1.8* 2.2*   < > = values in this interval not displayed.   No results for input(s): LIPASE, AMYLASE in the last 168 hours. No results for input(s): AMMONIA in the last 168 hours. Coagulation profile No results for input(s): INR, PROTIME in the last 168 hours. COVID-19 Labs  No results for input(s): DDIMER, FERRITIN, LDH, CRP in the last 72 hours.  Lab Results  Component Value Date   [place] NEGATIVE 02/14/2020   [place] NEGATIVE 12/20/2019    CBC: Recent Labs  Lab 03/26/20 0221 03/27/20 0245 03/28/20 0233 03/29/20 0446 03/30/20 0347  WBC 15.3* 16.7* 18.4* 16.0* 18.0*  NEUTROABS 10.7* 10.9* 12.5* 11.4* 12.9*  HGB 9.0* 9.6* 9.6* 9.8* 9.8*  HCT 29.2* 30.9* 31.3* 32.0* 32.2*  MCV 82.0 81.7 82.2 82.7 82.6  PLT 232 273 228 257 225   Cardiac Enzymes: No results for input(s): CKTOTAL, CKMB, CKMBINDEX, TROPONINI in the last 168 hours. BNP (last 3 results) No results for input(s): PROBNP in the last 8760 hours. CBG: Recent Labs  Lab 03/29/20 1729 03/29/20 2052 03/29/20 2325 03/30/20 0318 03/30/20 0829  GLUCAP 115* 176* 126* 128* 118*   D-Dimer: No results  for input(s): DDIMER in the last 72 hours. Hgb A1c: No results for input(s): HGBA1C in the last 72 hours. Lipid Profile: No results for input(s): CHOL, HDL, LDLCALC, TRIG, CHOLHDL, LDLDIRECT in the last 72 hours. Thyroid function studies: No results for input(s): TSH, T4TOTAL, T3FREE, THYROIDAB in the last 72 hours.  Invalid input(s): FREET3 Anemia work up: No results for input(s): VITAMINB12, FOLATE, FERRITIN,  TIBC, IRON, RETICCTPCT in the last 72 hours. Sepsis Labs: Recent Labs  Lab 03/27/20 0245 03/28/20 0233 03/29/20 0446 03/30/20 0347  WBC 16.7* 18.4* 16.0* 18.0*   Microbiology Recent Results (from the past 240 hour(s))  MRSA PCR Screening     Status: None   Collection Time: 03/27/20  7:59 PM   Specimen: Nasal Mucosa; Nasopharyngeal  Result Value Ref Range Status   MRSA by PCR NEGATIVE NEGATIVE Final    Comment:        The GeneXpert MRSA Assay (FDA approved for NASAL specimens only), is one component of a comprehensive MRSA colonization surveillance program. It is not intended to diagnose MRSA infection nor to guide or monitor treatment for MRSA infections. Performed at [facility], [place] [address], [place], [place] [address]   Culture, Urine     Status: Abnormal   Collection Time: 03/28/20  2:10 PM   Specimen: Urine, Random  Result Value Ref Range Status   Specimen Description URINE, RANDOM  Final   Special Requests   Final    NONE Performed at [facility], [place] [address], [place], [place] [address]    Culture 30,000 COLONIES/mL YEAST (A)  Final   Report Status 03/29/2020 FINAL  Final  C Difficile Quick Screen (NO PCR Reflex)     Status: None   Collection Time: 03/29/20 10:20 AM   Specimen: STOOL  Result Value Ref Range Status   C Diff antigen NEGATIVE NEGATIVE Final   C Diff toxin NEGATIVE NEGATIVE Final   C Diff interpretation No C. difficile detected.  Final    Comment: Performed at [facility], [place] [address] [place], [place] [address]     Medications:    apixaban  2.5 mg Per Tube BID   chlorhexidine  15 mL Mouth Rinse BID   chlorhexidine gluconate (MEDLINE KIT)  15 mL Mouth Rinse BID   Chlorhexidine Gluconate Cloth  6 each Topical Q0600   darbepoetin (ARANESP) injection - DIALYSIS  100 mcg Intravenous Q Mon-HD   feeding supplement (PRO-STAT SUGAR FREE 64)  30 mL Per Tube BID   fluconazole  100 mg Oral Daily   insulin aspart   0-6 Units Subcutaneous Q4H   insulin aspart  2 Units Subcutaneous Q4H   levothyroxine  25 mcg Per Tube Q0600   mouth rinse  15 mL Mouth Rinse q12n4p   metoprolol tartrate  25 mg Per Tube BID   metroNIDAZOLE  500 mg Per Tube Q8H   nutrition supplement (JUVEN)  1 packet Per Tube BID BM   pantoprazole sodium  40 mg Per Tube Daily   phenylephrine 200 mcg / ml (NON-ED USE ONLY) injection  100 mcg Intracavernosal Once   vancomycin  500 mg Intravenous Q M,W,F-HD   Continuous Infusions:  sodium chloride 250 mL (03/24/20 2353)   0.9 % NaCl with KCl 20 mEq / L 100 mL/hr at 03/30/20 0514   feeding supplement (NEPRO CARB STEADY) 1,000 mL (03/29/20 1205)   meropenem (MERREM) IV 500 mg (03/29/20 2117)      LOS: 45 days  Charlynne Cousins  [facility]  03/30/2020, 10:22 AM

## 2020-03-30 NOTE — Progress Notes (Signed)
[facility] Progress Note  Subjective: seen in pt's room, no c/o's, nods head in response to questions  Vitals:   03/30/20 0600 03/30/20 0805 03/30/20 0831 03/30/20 1158  BP:  119/66  116/68  Pulse: 89  87 89  Resp: 20 (!) 37 20 18  Temp:   98.1 F (36.7 C) 98.7 F (37.1 C)  TempSrc:   Axillary Axillary  SpO2: 92%  95% 96%  Weight:      Height:        Exam:  eyes opens, tracks and nods head to most questions  no jvd  Chest coarse BS bilat  Cor reg no RG  Abd soft ntnd no ascites, +llq ostomy, +peg tube   Ext 1+ bilat UE and hip edema   Nonfocal, +gen'd weakness       Summary:came [facility] after COVID hospitalization which included mech ventilation and trach placement. At [facility] had severe infections/ bacteremia/ PNA and developed AKI felt due to IV gent toxicity requiring initiation of HD 12/16/19. Admitted to ICU [facility] for trach bleed on 5/19. Was getting MWF HD at [facility] prior to admit here.   OP HD: started March 2021 at [facility] as above MWF Getting HD here 3.5- 4h, UF 0.5- 1.5 L, hep 2000 prn admit 62.8kg > range 56.9- 64.7kg here    Assessment/ Plan: 1. 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  Resp status a little better this week. Now back on TC again.  Full code now.  CXR had bilat infiltrates, continues on broad-spec IV abx thru 7/8. Concern is for ongoing aspiration 2. Enterococcus/ MRSE bacteremia HD cath sepsis - TDC removed 5/26, per ID getting 6 wks vanc/ fortaz/ flagyl and meropenam for polymicrobial cath sepsis and sacral osteomyelitis thru 04/04/20.  3. Sacral osteo/ decub stage IV - as per #2 4. ESRD - on HD since mid March 2021. HD MWF at [facility] S/P new TDC 6/2 by IR. Next HD Monday.  5. BP/vol - edema a little better today, wt's down to 59kg, he will do better around 55- 57kg most likely. Will try to cont to move vol down. Poor CRRT candidate overall. Max UF w/  HD 2- 2.5L.  7. AMS - variable, seems overall improved 8. Atrial fib - per primary, on eliquis 2.5 BID 9. Anemia ckd - transfuse prn, Hb remains 9- 10 range. on ESA 10. Hypercalcemia: w/ low PTH, prob from immobility,  improved. Cont low 2.0 Ca++ bath , PTH 29.  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 DNR/ very poor prognosis-  LTACH only option for disposition given needs but apparently not a candidate. Family asked not to be contacted by PCT anymore.   Rob Dedra Matsuo 03/30/2020, 12:40 PM   Recent Labs  Lab 03/29/20 0446 03/30/20 0347  K 3.4* 4.1  BUN 101* 43*  CREATININE 3.11* 1.91*  CALCIUM 9.5 9.1  PHOS 3.5 1.9*  HGB 9.8* 9.8*   Inpatient medications: . apixaban  2.5 mg Per Tube BID  . chlorhexidine  15 mL Mouth Rinse BID  . chlorhexidine gluconate (MEDLINE KIT)  15 mL Mouth Rinse BID  . Chlorhexidine Gluconate Cloth  6 each Topical Q0600  . darbepoetin (ARANESP) injection - DIALYSIS  100 mcg Intravenous Q Mon-HD  . feeding supplement (PRO-STAT SUGAR FREE 64)  30 mL Per Tube BID  . fluconazole  100 mg Oral Daily  . free water  200 mL Per Tube  Q6H  . insulin aspart  0-6 Units Subcutaneous Q4H  . insulin aspart  2 Units Subcutaneous Q4H  . levothyroxine  25 mcg Per Tube Q0600  . mouth rinse  15 mL Mouth Rinse q12n4p  . metoprolol tartrate  25 mg Per Tube BID  . nutrition supplement (JUVEN)  1 packet Per Tube BID BM  . pantoprazole sodium  40 mg Per Tube Daily  . phenylephrine 200 mcg / ml (NON-ED USE ONLY) injection  100 mcg Intracavernosal Once  . vancomycin  500 mg Intravenous Q M,W,F-HD   . 0.9 % NaCl with KCl 20 mEq / L 10 mL/hr (03/30/20 1035)  . feeding supplement (NEPRO CARB STEADY) 50 mL/hr at 03/30/20 1100  . meropenem [facility]) IV 500 mg (03/29/20 2117)   acetaminophen (TYLENOL) oral liquid 160 mg/5 mL, docusate, fentaNYL (SUBLIMAZE) injection, heparin, ipratropium-albuterol, metoprolol tartrate, polyethylene glycol

## 2020-03-30 NOTE — Progress Notes (Signed)
Since pt is on Merrem, ok to dc Flagyl since Merrem will cover anaerobes. Per Dr. Aileen Fass.  Onnie Boer, PharmD, BCIDP, AAHIVP, CPP Infectious Disease Pharmacist 03/30/2020 10:58 AM

## 2020-03-31 LAB — CBC WITH DIFFERENTIAL/PLATELET
Abs Immature Granulocytes: 0.14 10*3/uL — ABNORMAL HIGH (ref 0.00–0.07)
Basophils Absolute: 0.1 10*3/uL (ref 0.0–0.1)
Basophils Relative: 1 %
Eosinophils Absolute: 0.9 10*3/uL — ABNORMAL HIGH (ref 0.0–0.5)
Eosinophils Relative: 5 %
HCT: 31.1 % — ABNORMAL LOW (ref 39.0–52.0)
Hemoglobin: 9.7 g/dL — ABNORMAL LOW (ref 13.0–17.0)
Immature Granulocytes: 1 %
Lymphocytes Relative: 15 %
Lymphs Abs: 2.6 10*3/uL (ref 0.7–4.0)
MCH: 25.3 pg — ABNORMAL LOW (ref 26.0–34.0)
MCHC: 31.2 g/dL (ref 30.0–36.0)
MCV: 81.2 fL (ref 80.0–100.0)
Monocytes Absolute: 1.5 10*3/uL — ABNORMAL HIGH (ref 0.1–1.0)
Monocytes Relative: 8 %
Neutro Abs: 12 10*3/uL — ABNORMAL HIGH (ref 1.7–7.7)
Neutrophils Relative %: 70 %
Platelets: 267 10*3/uL (ref 150–400)
RBC: 3.83 MIL/uL — ABNORMAL LOW (ref 4.22–5.81)
RDW: 19.1 % — ABNORMAL HIGH (ref 11.5–15.5)
WBC: 17.2 10*3/uL — ABNORMAL HIGH (ref 4.0–10.5)
nRBC: 0 % (ref 0.0–0.2)

## 2020-03-31 LAB — GLUCOSE, CAPILLARY
Glucose-Capillary: 118 mg/dL — ABNORMAL HIGH (ref 70–99)
Glucose-Capillary: 121 mg/dL — ABNORMAL HIGH (ref 70–99)
Glucose-Capillary: 122 mg/dL — ABNORMAL HIGH (ref 70–99)
Glucose-Capillary: 131 mg/dL — ABNORMAL HIGH (ref 70–99)
Glucose-Capillary: 132 mg/dL — ABNORMAL HIGH (ref 70–99)
Glucose-Capillary: 155 mg/dL — ABNORMAL HIGH (ref 70–99)
Glucose-Capillary: 170 mg/dL — ABNORMAL HIGH (ref 70–99)

## 2020-03-31 LAB — RENAL FUNCTION PANEL
Albumin: 1.9 g/dL — ABNORMAL LOW (ref 3.5–5.0)
Anion gap: 13 (ref 5–15)
BUN: 79 mg/dL — ABNORMAL HIGH (ref 8–23)
CO2: 21 mmol/L — ABNORMAL LOW (ref 22–32)
Calcium: 9.1 mg/dL (ref 8.9–10.3)
Chloride: 103 mmol/L (ref 98–111)
Creatinine, Ser: 2.74 mg/dL — ABNORMAL HIGH (ref 0.61–1.24)
GFR calc Af Amer: 26 mL/min — ABNORMAL LOW (ref >60)
GFR calc non Af Amer: 22 mL/min — ABNORMAL LOW (ref >60)
Glucose, Bld: 115 mg/dL — ABNORMAL HIGH (ref 70–99)
Phosphorus: 3.4 mg/dL (ref 2.5–4.6)
Potassium: 4.6 mmol/L (ref 3.5–5.1)
Sodium: 137 mmol/L (ref 135–145)

## 2020-03-31 MED ORDER — FREE WATER
200.0000 mL | Status: DC
  Administered 2020-03-31: 200 mL

## 2020-03-31 MED ORDER — CHLORHEXIDINE GLUCONATE CLOTH 2 % EX PADS
6.0000 | MEDICATED_PAD | Freq: Every day | CUTANEOUS | Status: DC
  Administered 2020-04-01 – 2020-04-22 (×22): 6 via TOPICAL

## 2020-03-31 MED ORDER — SODIUM CHLORIDE 0.9 % IV SOLN: INTRAVENOUS | Status: DC

## 2020-03-31 NOTE — Progress Notes (Signed)
Pharmacy Antibiotic Note  Shane Banks is a 74 y.o. male admitted on 02/14/2020 with CoNS/E.faecalis bacteremia/sacral osteomyelitis/pneumonia. 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 Line pulled 5/26, line holiday and had it replaced 5/28. TTE negative. Repeat BCx are negative. ID planning 6 weeks of therapy.  Abx to be completed on 7/8. Pt is currently on vanc/merrem.   Height: 5\' 9"  (175.3 cm) Weight: 60.3 kg (132 lb 15 oz) IBW/kg (Calculated) : 70.7  Temp (24hrs), Avg:98.7 F (37.1 C), Min:98.2 F (36.8 C), Max:99.2 F (37.3 C)  Recent Labs  Lab 03/26/20 0221 03/26/20 0221 03/27/20 0245 03/28/20 0233 03/29/20 0446 03/30/20 0347 03/31/20 0625  WBC 15.3*  --  16.7* 18.4* 16.0* 18.0*  --   CREATININE 1.92*   < > 2.85* 2.20* 3.11* 1.91* 2.74*  VANCORANDOM  --   --   --   --  18  --   --    < > = values in this interval not displayed.    Estimated Creatinine Clearance: 20.8 mL/min (A) (by C-G formula based on SCr of 2.74 mg/dL (H)).    Allergies  Allergen Reactions   Aspirin Rash   Cephalexin Other (See Comments)    dizziness   Penicillins     Itching,swelling    Plan: Continue vanc 500mg  MWF Continue merrem 500mg  q24 Stop abx on 7/8  Onnie Boer, PharmD, [place], AAHIVP, CPP Infectious Disease Pharmacist 03/31/2020 10:55 AM

## 2020-03-31 NOTE — Progress Notes (Signed)
Notified MD of thick blood tinged secretions from trach, no new orders at this time will continue to monitor pt.

## 2020-03-31 NOTE — Progress Notes (Addendum)
[facility] Progress Note  Subjective: seen in pt's room, no c/o's  Vitals:   03/31/20 0802 03/31/20 0847 03/31/20 1158 03/31/20 1216  BP:  (!) 141/78 (!) 141/78 (!) 156/81  Pulse: 91 94 89 88  Resp: (!) 28 (!) 38 (!) 24 (!) 29  Temp:  99.2 F (37.3 C)  98.1 F (36.7 C)  TempSrc:  Oral  Axillary  SpO2: 96% 95% 96% 94%  Weight:      Height:        Exam:  eyes opens, tracks and nods yes and no to questions  no jvd  Chest coarse BS bilat  Cor reg no RG  Abd soft ntnd no ascites, +llq ostomy, +peg tube   Ext persistent mild-mod pitting UE and hip edema   Nonfocal, +gen'd weakness, responds appropriately       Summary:came from [facility] after COVID hospitalization which included mech ventilation and trach placement. At [facility] had severe infections/ bacteremia/ PNA and developed AKI felt due to IV gent toxicity requiring initiation of HD 12/16/19. Admitted to ICU [facility] for trach bleed on 5/19. Was getting MWF HD at [facility] prior to admit here.   OP HD: started March 2021 at [facility] as above MWF Getting HD here 3.5- 4h, UF 0.5- 1.5 L, hep 2000 prn admit 62.8kg > range 56.9- 64.7kg here    Assessment/ Plan: 1. 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  Resp status a little better this week. Now back on TC again.  Full code now.  CXR had bilat infiltrates, continues on broad-spec IV abx thru 7/8.  2. Enterococcus/ MRSE bacteremia HD cath sepsis - TDC removed 5/26, per ID getting 6 wks vanc/ fortaz/ flagyl and meropenam for polymicrobial cath sepsis and sacral osteomyelitis thru 04/04/20.  3. Sacral osteo/ decub stage IV - as per #2 4. ESRD - on HD since mid March 2021. HD MWF at [facility]. S/P new TDC 6/2 by IR. Next HD Monday. Poor CRRT candidate. Changed labs to M-W-F cbc and renal panel.  5. BP/vol - edema a little better today, wt's down but he would benefit from getting vol down further this  week. May be that his improved resp status (RR 40's > 20's) may be due to getting vol down.  The high BUN is not pre-renal as he has virtually no renal fxn, suspect hypercatabolic given infections and tissue necrosis/ decub, seems to be resolving maybe.  7. AMS - variable, seems overall improved 8. Atrial fib - per primary, on eliquis 2.5 BID 9. Anemia ckd - transfuse prn, Hb remains 9- 10 range. on ESA 10. Hypercalcemia: w/ low PTH, prob from immobility,  improved. Cont low 2.0 Ca++ bath , PTH 29.  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 DNR/ very poor prognosis-  LTACH only option for disposition given needs but apparently not a candidate. Family asked not to be contacted by PCT anymore.   Shane Banks 03/31/2020, 1:51 PM   Recent Labs  Lab 03/30/20 0347 03/31/20 0625 03/31/20 1026  K 4.1 4.6  --   BUN 43* 79*  --   CREATININE 1.91* 2.74*  --   CALCIUM 9.1 9.1  --   PHOS 1.9* 3.4  --   HGB 9.8*  --  9.7*   Inpatient medications: . apixaban  2.5 mg Per Tube BID  . chlorhexidine  15 mL Mouth Rinse BID  . chlorhexidine gluconate (MEDLINE KIT)  15  mL Mouth Rinse BID  . Chlorhexidine Gluconate Cloth  6 each Topical Q0600  . darbepoetin (ARANESP) injection - DIALYSIS  100 mcg Intravenous Q Mon-HD  . feeding supplement (PRO-STAT SUGAR FREE 64)  30 mL Per Tube BID  . free water  200 mL Per Tube Q4H  . insulin aspart  0-6 Units Subcutaneous Q4H  . insulin aspart  2 Units Subcutaneous Q4H  . levothyroxine  25 mcg Per Tube Q0600  . mouth rinse  15 mL Mouth Rinse q12n4p  . metoprolol tartrate  25 mg Per Tube BID  . nutrition supplement (JUVEN)  1 packet Per Tube BID BM  . pantoprazole sodium  40 mg Per Tube Daily  . phenylephrine 200 mcg / ml (NON-ED USE ONLY) injection  100 mcg Intracavernosal Once  . vancomycin  500 mg Intravenous Q M,W,F-HD   . sodium chloride 100 mL/hr at 03/31/20 0845  . feeding supplement (NEPRO CARB STEADY) 50 mL/hr at 03/30/20 1600  . meropenem [facility]) IV 500 mg  (03/30/20 2049)   acetaminophen (TYLENOL) oral liquid 160 mg/5 mL, docusate, fentaNYL (SUBLIMAZE) injection, heparin, ipratropium-albuterol, metoprolol tartrate, polyethylene glycol

## 2020-03-31 NOTE — Progress Notes (Addendum)
[facility] PROGRESS NOTE    Progress Note  Shane Banks  MEB:[id number] DOB: 03-Dec-1945 DOA: 02/14/2020 PCP: [name], MD     Brief Narrative:   Shane Banks is an 74 y.o. male past medical history significant of chronic respiratory failure status post tracheostomy, end-stage renal disease on dialysis, chronic atrial fibrillation nonhemorrhagic stroke who was [facility] for evaluation of trach site bleeding.  Patient had a recent Covid pneumonia and has been on the vent because of progressive ARDS.  Because of poor improvement in the event underwent tracheostomy and discharged to [facility].  He recently had a multidrug-resistant Pseudomonas and trichomonas bacteremia.  On 03/15/2020 patient returned to the ICU for ventilatory and pressor support he developed septic shock and developed acute renal failure requiring dialysis.  On 02/14/2020 developed hemoptysis and was transferred to [facility] for evaluation.  He is also noted to have a sacral decubitus ulcer osteomyelitis leading to sepsis bacteremia.  He is now off pressors but continues to be vent dependent with a tracheostomy.  PCCM is following him trying to wean off trach collar they are following him weekly.  Patient was taken off the vent on 03/21/2020.  Transfer to the floor in 03/22/2020.  Due to the lack of insurance approval he cannot go back to [facility].  Social worker is working on Clinical research associate.  He will need to be on a cuffless trach's to be effective.  Assessment/Plan:   Acute on chronic hypoxemic respiratory failure requiring tracheostomy with prolonged mechanical ventilation: Pulmonary critical care to follow-up and advised and transition to cuffless trach.  He remains a full code. Continue IV imipenem for possible Pseudomonas pneumonia. He has defervesced, CBC with differential is pending this morning. Pulmonary and critical care following weekly, try to de-escalate to cuffless trach, once  this is done he was started getting him to a skilled nursing facility. Overall he has a poor prognosis, family change him to a full code.  New onset of diarrhea: C. difficile PCR is negative, likely due to feedings in the setting of med apparent.  Sacral osteomyelitis and HD catheter sepsis/  Enterococcal bacteremia: Tunneled catheter removal on 02/21/2020. Will require 6 weeks of IV antibiotics per ID he will require vancomycin and Flagyl.  Paroxysmal atrial fibrillation: Currently rate controlled anticoagulation was discontinued due to suspected GI bleed. Currently on metoprolol.  End-stage renal disease: On HD since March 2021 status post tunneled catheter placement 02/28/2020.  Chronic normocytic anemia: Due to anemia of chronic renal disease transfuse 1 unit of packed red blood cells continue erythropoietin and IV iron per renal.  History of nonhemorrhagic CVA: CT of the head showed atrophy and chronic microvascular disease. Not on anticoagulation due to possible GI bleed.  Unstageable sacral pressure ulcer with osteomyelitis: Continue wound care recommendations.  Severe protein caloric malnutrition: Continue tube feedings.  Ethics: We will consult palliative care to discuss with the family goals of care.  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 (Active)  02/14/20 1906  Location: Sacrum  Location Orientation: Medial  Staging: Stage 4 - Full thickness tissue loss with exposed bone, tendon or muscle.  Wound Description (Comments):   Present on Admission: Yes    Estimated body mass index is 19.63 kg/m as calculated from the following:   Height as of this encounter: 5' 9" (1.753 m).   Weight as  of this encounter: 60.3 kg. Malnutrition Type:  Nutrition Problem: Severe Malnutrition Etiology: chronic illness (chronic  respiratory failure related to COVID ARDS)   Malnutrition Characteristics:  Signs/Symptoms: severe muscle depletion, severe fat depletion   Nutrition Interventions:  Interventions: Prostat, Tube feeding, Juven    DVT prophylaxis: lovenox  Family Communication:none Status is: Inpatient  Remains inpatient appropriate because:Hemodynamically unstable   Dispo:  Patient From:    Planned Disposition: [place]  Expected discharge date: 04/01/20  Medically stable for discharge: No    Code Status:     Code Status Orders  (From admission, onward)         Start     Ordered   03/15/20 1935  Full code  Continuous        03/15/20 1934        Code Status History    Date Active Date Inactive Code Status Order ID Comments User Context   02/14/2020 1749 03/15/2020 1934 DNR [id number]  Corey Harold, NP ED   12/12/2019 0739 02/14/2020 1420 DNR [id number]  Agnes Lawrence Inpatient   11/27/2019 0814 12/12/2019 0739 Full Code [id number]  Agnes Lawrence Inpatient   11/22/2019 0059 11/27/2019 0814 DNR [id number]  Maudry Diego Inpatient   Advance Care Planning Activity        IV Access:    Peripheral IV   Procedures and diagnostic studies:   No results found.   Medical Consultants:    None.  Anti-Infectives:   IV Vanco, Rocephin and Flagyl  Subjective:    Shane Banks relates his breathing is better.  Objective:    Vitals:   03/30/20 2315 03/31/20 0325 03/31/20 0500 03/31/20 0802  BP:      Pulse: 98 92 89 91  Resp: 20 20 (!) 28 (!) 28  Temp:      TempSrc:      SpO2: 98% 97% 98% 96%  Weight:   60.3 kg   Height:       SpO2: 96 % O2 Flow Rate (L/min): 5 L/min FiO2 (%): (S) 21 %   Intake/Output Summary (Last 24 hours) at 03/31/2020 0840 Last data filed at 03/31/2020 0700 Gross per 24 hour  Intake 700 ml  Output 150 ml  Net 550 ml   Filed Weights   03/29/20 1634 03/30/20 0500 03/31/20 0500  Weight: 60 kg 59.8 kg 60.3 kg     Exam: General exam: In no acute distress with trach in place with copious amount of secretions. Respiratory system: Good air movement and clear to auscultation. Cardiovascular system: S1 & S2 heard, RRR. No JVD. Gastrointestinal system: Abdomen is nondistended, soft and nontender.  PEG tube in place Extremities: No pedal edema. Skin: No rashes, lesions or ulcers Psychiatry: Judgement and insight appear normal. Mood & affect appropriate.  Data Reviewed:    Labs: Basic Metabolic Panel: Recent Labs  Lab 03/27/20 0245 03/27/20 0245 03/27/20 2117 03/27/20 2117 03/28/20 0233 03/28/20 0233 03/29/20 0446 03/29/20 0446 03/30/20 0347 03/31/20 0625  NA 139  --   --   --  136  --  137  --  138 137  K 3.6   < > 3.5   < > 3.3*   < > 3.4*   < > 4.1 4.6  CL 104  --   --   --  99  --  99  --  102 103  CO2 27  --   --   --  27  --  28  --  25 21*  GLUCOSE 130*  --   --   --  123*  --  132*  --  133* 115*  BUN 96*  --   --   --  66*  --  101*  --  43* 79*  CREATININE 2.85*  --   --   --  2.20*  --  3.11*  --  1.91* 2.74*  CALCIUM 9.3  --   --   --  9.1  --  9.5  --  9.1 9.1  MG  --   --  1.7  --   --   --   --   --   --   --   PHOS 3.3  --   --   --  2.7  --  3.5  --  1.9* 3.4   < > = values in this interval not displayed.   GFR Estimated Creatinine Clearance: 20.8 mL/min (A) (by C-G formula based on SCr of 2.74 mg/dL (H)). Liver Function Tests: Recent Labs  Lab 03/25/20 1053 03/26/20 0221 03/27/20 0245 03/28/20 0233 03/29/20 0446 03/30/20 0347 03/31/20 0625  AST 70*  --   --   --   --   --   --   ALT 16  --   --   --   --   --   --   ALKPHOS 101  --   --   --   --   --   --   BILITOT 1.1  --   --   --   --   --   --   PROT 7.8  --   --   --   --   --   --   ALBUMIN 2.2*   < > 1.9* 1.9* 1.8* 2.2* 1.9*   < > = values in this interval not displayed.   No results for input(s): LIPASE, AMYLASE in the last 168 hours. No results for input(s): AMMONIA in the last 168  hours. Coagulation profile No results for input(s): INR, PROTIME in the last 168 hours. COVID-19 Labs  No results for input(s): DDIMER, FERRITIN, LDH, CRP in the last 72 hours.  Lab Results  Component Value Date   [place] NEGATIVE 02/14/2020   [place] NEGATIVE 12/20/2019    CBC: Recent Labs  Lab 03/26/20 0221 03/27/20 0245 03/28/20 0233 03/29/20 0446 03/30/20 0347  WBC 15.3* 16.7* 18.4* 16.0* 18.0*  NEUTROABS 10.7* 10.9* 12.5* 11.4* 12.9*  HGB 9.0* 9.6* 9.6* 9.8* 9.8*  HCT 29.2* 30.9* 31.3* 32.0* 32.2*  MCV 82.0 81.7 82.2 82.7 82.6  PLT 232 273 228 257 225   Cardiac Enzymes: No results for input(s): CKTOTAL, CKMB, CKMBINDEX, TROPONINI in the last 168 hours. BNP (last 3 results) No results for input(s): PROBNP in the last 8760 hours. CBG: Recent Labs  Lab 03/30/20 1156 03/30/20 1737 03/30/20 1950 03/31/20 0004 03/31/20 0403  GLUCAP 120* 137* 133* 118* 131*   D-Dimer: No results for input(s): DDIMER in the last 72 hours. Hgb A1c: No results for input(s): HGBA1C in the last 72 hours. Lipid Profile: No results for input(s): CHOL, HDL, LDLCALC, TRIG, CHOLHDL, LDLDIRECT in the last 72 hours. Thyroid function studies: No results for input(s): TSH, T4TOTAL, T3FREE, THYROIDAB in the last 72 hours.  Invalid input(s): FREET3 Anemia work up: No results for input(s): VITAMINB12, FOLATE, FERRITIN, TIBC, IRON, RETICCTPCT in the last 72 hours. Sepsis Labs: Recent Labs  Lab 03/27/20 0245 03/28/20 0233 03/29/20 0446 03/30/20 0347  WBC  16.7* 18.4* 16.0* 18.0*   Microbiology Recent Results (from the past 240 hour(s))  MRSA PCR Screening     Status: None   Collection Time: 03/27/20  7:59 PM   Specimen: Nasal Mucosa; Nasopharyngeal  Result Value Ref Range Status   MRSA by PCR NEGATIVE NEGATIVE Final    Comment:        The GeneXpert MRSA Assay (FDA approved for NASAL specimens only), is one component of a comprehensive MRSA colonization surveillance  program. It is not intended to diagnose MRSA infection nor to guide or monitor treatment for MRSA infections. Performed at [facility], [place] [address], [place], [place] [address]   Culture, Urine     Status: Abnormal   Collection Time: 03/28/20  2:10 PM   Specimen: Urine, Random  Result Value Ref Range Status   Specimen Description URINE, RANDOM  Final   Special Requests   Final    NONE Performed at [facility], [place] [address], [place], [place] [address]    Culture 30,000 COLONIES/mL YEAST (A)  Final   Report Status 03/29/2020 FINAL  Final  C Difficile Quick Screen (NO PCR Reflex)     Status: None   Collection Time: 03/29/20 10:20 AM   Specimen: STOOL  Result Value Ref Range Status   C Diff antigen NEGATIVE NEGATIVE Final   C Diff toxin NEGATIVE NEGATIVE Final   C Diff interpretation No C. difficile detected.  Final    Comment: Performed at [facility], [place] [address], [place], [place] [address]     Medications:   . apixaban  2.5 mg Per Tube BID  . chlorhexidine  15 mL Mouth Rinse BID  . chlorhexidine gluconate (MEDLINE KIT)  15 mL Mouth Rinse BID  . Chlorhexidine Gluconate Cloth  6 each Topical Q0600  . darbepoetin (ARANESP) injection - DIALYSIS  100 mcg Intravenous Q Mon-HD  . feeding supplement (PRO-STAT SUGAR FREE 64)  30 mL Per Tube BID  . fluconazole  100 mg Oral Daily  . free water  200 mL Per Tube Q6H  . insulin aspart  0-6 Units Subcutaneous Q4H  . insulin aspart  2 Units Subcutaneous Q4H  . levothyroxine  25 mcg Per Tube Q0600  . mouth rinse  15 mL Mouth Rinse q12n4p  . metoprolol tartrate  25 mg Per Tube BID  . nutrition supplement (JUVEN)  1 packet Per Tube BID BM  . pantoprazole sodium  40 mg Per Tube Daily  . phenylephrine 200 mcg / ml (NON-ED USE ONLY) injection  100 mcg Intracavernosal Once  . vancomycin  500 mg Intravenous Q M,W,F-HD   Continuous Infusions: . 0.9 % NaCl with KCl 20 mEq / L 10 mL/hr (03/30/20 1035)  . feeding  supplement (NEPRO CARB STEADY) 50 mL/hr at 03/30/20 1600  . meropenem (MERREM) IV 500 mg (03/30/20 2049)      LOS: 7 days   Charlynne Cousins  [facility]  03/31/2020, 8:40 AM

## 2020-03-31 NOTE — Progress Notes (Signed)
Patient had 8 beats of VT earlier on this shift. Dr. Myna Hidalgo notified without any new order. Will continue to monitor.

## 2020-04-01 LAB — RENAL FUNCTION PANEL
Albumin: 1.9 g/dL — ABNORMAL LOW (ref 3.5–5.0)
Anion gap: 11 (ref 5–15)
BUN: 118 mg/dL — ABNORMAL HIGH (ref 8–23)
CO2: 22 mmol/L (ref 22–32)
Calcium: 9.3 mg/dL (ref 8.9–10.3)
Chloride: 103 mmol/L (ref 98–111)
Creatinine, Ser: 3.24 mg/dL — ABNORMAL HIGH (ref 0.61–1.24)
GFR calc Af Amer: 21 mL/min — ABNORMAL LOW (ref >60)
GFR calc non Af Amer: 18 mL/min — ABNORMAL LOW (ref >60)
Glucose, Bld: 129 mg/dL — ABNORMAL HIGH (ref 70–99)
Phosphorus: 4 mg/dL (ref 2.5–4.6)
Potassium: 4 mmol/L (ref 3.5–5.1)
Sodium: 136 mmol/L (ref 135–145)

## 2020-04-01 LAB — CBC
HCT: 30.9 % — ABNORMAL LOW (ref 39.0–52.0)
Hemoglobin: 9.5 g/dL — ABNORMAL LOW (ref 13.0–17.0)
MCH: 25.4 pg — ABNORMAL LOW (ref 26.0–34.0)
MCHC: 30.7 g/dL (ref 30.0–36.0)
MCV: 82.6 fL (ref 80.0–100.0)
Platelets: 222 10*3/uL (ref 150–400)
RBC: 3.74 MIL/uL — ABNORMAL LOW (ref 4.22–5.81)
RDW: 18.6 % — ABNORMAL HIGH (ref 11.5–15.5)
WBC: 18 10*3/uL — ABNORMAL HIGH (ref 4.0–10.5)
nRBC: 0 % (ref 0.0–0.2)

## 2020-04-01 LAB — GLUCOSE, CAPILLARY
Glucose-Capillary: 105 mg/dL — ABNORMAL HIGH (ref 70–99)
Glucose-Capillary: 112 mg/dL — ABNORMAL HIGH (ref 70–99)
Glucose-Capillary: 122 mg/dL — ABNORMAL HIGH (ref 70–99)
Glucose-Capillary: 135 mg/dL — ABNORMAL HIGH (ref 70–99)
Glucose-Capillary: 156 mg/dL — ABNORMAL HIGH (ref 70–99)
Glucose-Capillary: 161 mg/dL — ABNORMAL HIGH (ref 70–99)

## 2020-04-01 MED ORDER — DARBEPOETIN ALFA 100 MCG/0.5ML IJ SOSY
PREFILLED_SYRINGE | INTRAMUSCULAR | Status: AC
  Filled 2020-04-01: qty 0.5

## 2020-04-01 MED ORDER — HEPARIN SODIUM (PORCINE) 1000 UNIT/ML IJ SOLN
INTRAMUSCULAR | Status: AC
  Filled 2020-04-01: qty 8

## 2020-04-01 MED ORDER — VANCOMYCIN HCL IN DEXTROSE 500-5 MG/100ML-% IV SOLN
INTRAVENOUS | Status: AC
  Filled 2020-04-01: qty 100

## 2020-04-01 NOTE — Progress Notes (Signed)
OT Cancellation Note  Patient Details Name: Shane Banks MRN: [id number] DOB: 1946-03-28   Cancelled Treatment:    Reason Eval/Treat Not Completed: Patient at procedure or test/ unavailable (at dialysis) Will re-attempt session as appropriate.  Layla Maw 04/01/2020, 12:54 PM

## 2020-04-01 NOTE — Progress Notes (Signed)
Nutrition Follow-up  DOCUMENTATION CODES:   Underweight, Severe malnutrition in context of chronic illness  INTERVENTION:   Continue tube feeding via PEG: - Nepro @ 50 ml/hr (1200 ml/day) - Pro-stat 30 ml BID  Tube feeding regimen provides 2360 kcal, 127 grams of protein, and 872 ml of H2O.   -Continue Juven BID per tube, each packet provides 80 calories, 8 grams of carbohydrate, 2.5 grams of protein, 7 grams of L-arginine and 7 grams of L-glutamine; supplement contains CaHMB, Vitamins C, E, B12 and zinc to promote wound healing.  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  Ongoing GOAL:   Patient will meet greater than or equal to 90% of their needs  Addressed via TF  MONITOR:   Diet advancement, Labs, Weight trends, TF tolerance, Skin, I & O's  REASON FOR ASSESSMENT:   Ventilator, Other (hx PEG)    ASSESSMENT:   74 yo male admitted with hemoptysis. 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  6/02 - s/p new [facility] 6/07 - trach collar 6/18 - returned to ICU for increased secretions requiring vent support 6/24 - back on trach collar 6/25 - transferred out of ICU  Pt undergoing HD at time of RD visit.   Remains on trach collar. Given recurrent PNA and increased secretions pt will unlikely transition to cuffless trach. Poor prognosis per NP. Only disposition option would be LTAC. Tolerating tube feeding at goal with Juven. Had ongoing diarrhea via colostomy, now improved. Currently under dry wt. TF increased last week (provides 40 kcal/kg, 2.1 g/kg protein).   EDW: 62.8 kg Current weight: 60.3 kg   Medications: aranesp, SS novolog Labs: CBG 105-155  Diet Order:   Diet Order            Diet NPO time specified  Diet effective now                  EDUCATION NEEDS:   No education needs have been identified at this time  Skin:  Skin Assessment: Skin Integrity Issues: Stage II: head Stage IV: sacrum (11cm x 8cm x 1.5cm) Other: L thigh wound  Last BM:  7/4- 400 ml via colostomy  Height:   Ht Readings from Last 1 Encounters:  03/18/20 5\' 9"  (1.753 m)    Weight:   Wt Readings from Last 1 Encounters:  04/01/20 60.3 kg    Ideal Body Weight:  72.7 kg  BMI:  Body mass index is 19.63 kg/m.  Estimated Nutritional Needs:   Kcal:  2200-2400  Protein:  110-135 grams  Fluid:  1 L + UOP   Lazar Tierce RD, LDN Clinical Nutrition Pager listed in [place]

## 2020-04-01 NOTE — Progress Notes (Signed)
PT Cancellation Note  Patient Details Name: Howard Bunte MRN: [id number] DOB: 01/29/1946   Cancelled Treatment:    Reason Eval/Treat Not Completed: Patient at procedure or test/unavailable; patient in HD, will attempt again another day.   Reginia Naas 04/01/2020, 1:42 PM  Magda Kiel, PT Acute Rehabilitation Services Pager:[phone] Office:[phone] 04/01/2020

## 2020-04-01 NOTE — Progress Notes (Signed)
[facility] NEPHROLOGY PROGRESS NOTE  Assessment/ Plan: Pt is a 74 y.o. yo male  who initially came from [facility] to [facility] after [facility] hospitalization which included mech ventilation and trach placement. At [facility] had severe infections/ bacteremia/ PNA and developed AKI felt due to IV gent toxicity requiring initiation of HD 12/16/19. Admitted to ICU [facility] for trach bleed on 5/19. Was getting MWF HD at [facility] prior to admit here.   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 On TC again. Full code now. CXR had bilat infiltrates, continues on broad-spec IV abx thru 7/8.   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   # Sacral osteo/ decub stage IV - as per #2  # ESRD - on HD since mid March 2021. HD MWF at [facility]. S/P new TDC 6/2 by IR.  Plan for HD today.    # BP/vol status: Ultrafiltration during dialysis. The high BUN is not pre-renal as he has virtually no renal fxn, suspect hypercatabolic given infections and tissue necrosis/ decub.  # Atrial fib - per primary, on eliquis 2.5 BID  # Anemia ckd - transfuse prn, Hb remains 9- 10 range. on ESA  # Hypercalcemia: w/ low PTH, prob from immobility, improved. Cont low 2.0 Ca++ bath , PTH 29.   # SP peg/ divert colostomy  # Disp- LTACH only option for disposition given needs but apparently not a candidate.   Subjective: Seen and examined.  Remains on trach, following simple commands by opening eyes.  No new event. Objective Vital signs in last 24 hours: Vitals:   04/01/20 0400 04/01/20 0513 04/01/20 0700 04/01/20 0747  BP:  (!) 142/78  137/72  Pulse: 87 88 81 88  Resp: 20 20 20  (!) 23  Temp:    99 F (37.2 C)  TempSrc:    Axillary  SpO2: 93% 93% 97% 97%  Weight:  60.3 kg    Height:       Weight  change: 0 kg  Intake/Output Summary (Last 24 hours) at 04/01/2020 0849 Last data filed at 04/01/2020 0700 Gross per 24 hour  Intake 700 ml  Output 600 ml  Net 100 ml       Labs: Basic Metabolic Panel: Recent Labs  Lab 03/30/20 0347 03/31/20 0625 04/01/20 0435  NA 138 137 136  K 4.1 4.6 4.0  CL 102 103 103  CO2 25 21* 22  GLUCOSE 133* 115* 129*  BUN 43* 79* 118*  CREATININE 1.91* 2.74* 3.24*  CALCIUM 9.1 9.1 9.3  PHOS 1.9* 3.4 4.0   Liver Function Tests: Recent Labs  Lab 03/25/20 1053 03/26/20 0221 03/30/20 0347 03/31/20 0625 04/01/20 0435  AST 70*  --   --   --   --   ALT 16  --   --   --   --   ALKPHOS 101  --   --   --   --   BILITOT 1.1  --   --   --   --   PROT 7.8  --   --   --   --   ALBUMIN 2.2*   < > 2.2* 1.9* 1.9*   < > = values in this interval not displayed.   No results for input(s): LIPASE, AMYLASE in the last 168 hours. No  results for input(s): AMMONIA in the last 168 hours. CBC: Recent Labs  Lab 03/28/20 0233 03/28/20 0233 03/29/20 0446 03/29/20 0446 03/30/20 0347 03/31/20 1026 04/01/20 0435  WBC 18.4*   < > 16.0*   < > 18.0* 17.2* 18.0*  NEUTROABS 12.5*   < > 11.4*  --  12.9* 12.0*  --   HGB 9.6*   < > 9.8*   < > 9.8* 9.7* 9.5*  HCT 31.3*   < > 32.0*   < > 32.2* 31.1* 30.9*  MCV 82.2  --  82.7  --  82.6 81.2 82.6  PLT 228   < > 257   < > 225 267 222   < > = values in this interval not displayed.   Cardiac Enzymes: No results for input(s): CKTOTAL, CKMB, CKMBINDEX, TROPONINI in the last 168 hours. CBG: Recent Labs  Lab 03/31/20 1749 03/31/20 2017 03/31/20 2327 04/01/20 0440 04/01/20 0816  GLUCAP 121* 155* 122* 135* 105*    Iron Studies: No results for input(s): IRON, TIBC, TRANSFERRIN, FERRITIN in the last 72 hours. Studies/Results: No results found.  Medications: Infusions: . feeding supplement (NEPRO CARB STEADY) 1,000 mL (04/01/20 0843)  . meropenem [facility]) IV 500 mg (03/31/20 2105)    Scheduled Medications: .  apixaban  2.5 mg Per Tube BID  . chlorhexidine  15 mL Mouth Rinse BID  . chlorhexidine gluconate (MEDLINE KIT)  15 mL Mouth Rinse BID  . Chlorhexidine Gluconate Cloth  6 each Topical Q0600  . darbepoetin (ARANESP) injection - DIALYSIS  100 mcg Intravenous Q Mon-HD  . feeding supplement (PRO-STAT SUGAR FREE 64)  30 mL Per Tube BID  . insulin aspart  0-6 Units Subcutaneous Q4H  . insulin aspart  2 Units Subcutaneous Q4H  . levothyroxine  25 mcg Per Tube Q0600  . mouth rinse  15 mL Mouth Rinse q12n4p  . metoprolol tartrate  25 mg Per Tube BID  . nutrition supplement (JUVEN)  1 packet Per Tube BID BM  . pantoprazole sodium  40 mg Per Tube Daily  . phenylephrine 200 mcg / ml (NON-ED USE ONLY) injection  100 mcg Intracavernosal Once  . vancomycin  500 mg Intravenous Q M,W,F-HD    have reviewed scheduled and prn medications.  Physical Exam: General: Ill-looking male with a tracheostomy, only opens eyes with the name. Heart:RRR, s1s2 nl Lungs:clear b/l, no crackle Abdomen:soft, colostomy bag noted.  Also has feeding tube. Extremities:No edema Dialysis Access: Right IJ TDC.  Obediah Welles Prasad Hajra Port 04/01/2020,8:49 AM  LOS: 47 days  Pager: [phone]

## 2020-04-01 NOTE — Progress Notes (Signed)
Was asked by family member to visit this man.  He was not able to communicate-but he seemed receptive.  I was told he was at [facility] and needed prayer.  I found him and had short time of speaking with him and then had prayer with him.   04/01/20 1300  Clinical Encounter Type  Visited With Patient  Visit Type Initial;Spiritual support  Referral From Family  Consult/Referral To Chaplain  Spiritual Encounters  Spiritual Needs Prayer  Stress Factors  Patient Stress Factors Not reviewed

## 2020-04-01 NOTE — Progress Notes (Addendum)
Occupational Therapy Treatment Patient Details Name: Shane Banks MRN: [id number] DOB: 06-12-1946 Today's Date: 04/01/2020    History of present illness 74 yo m with PMHx of afib with RVR, BPH, stroke, and COVID ARDS in January requiring trach and LTACH placement at [facility]. Had been tolerating trach collar, but on 5/19 developed hemoptysis and hypoxemic respiratory failure requiring vent. Transferred to [facility] on 5/19.  5/19 back on vent, 6/7 transitioned to [facility]   OT comments  Session focused on splint check and assessment of pt response to splinting protocol. Attempted splint check this AM, but pt at dialysis. Per staff, pt wore splint the previous night (unsure if all night) and to dialysis this AM before OT could provide splint check. Once pt back in room this PM from dialysis, B splints were doffed by nursing staff and OT provided skin check. Pt with small scab on radial side of L wrist and dark, discolored skin on palmart surface of R hand. Pt also noted with dried blood in between 2nd and 3rd digit of R hand. When blood removed in collaboration with nursing staff, small imperfection noted near 3rd PIP joint. Educated staff to leave splints off for remainder of day/night with RN passing along message to next shift. Orders and splint care/wear form put in place the previous week by OT (4 hours in morning, 4 in afternoon with 2 hours off in between). Plan to assess skin and splint fit again tomorrow.   Follow Up Recommendations  SNF;LTACH;Supervision/Assistance - 24 hour    Equipment Recommendations  Other (comment)    Recommendations for Other Services      Precautions / Restrictions Precautions Precautions: Fall;Other (comment) Precaution Comments: trach collar, PEG Restrictions Weight Bearing Restrictions: No       Mobility Bed Mobility                  Transfers                 General transfer comment: deferred     Balance                                            ADL either performed or assessed with clinical judgement   ADL Overall ADL's : Needs assistance/impaired     Grooming: Oral care;Total assistance Grooming Details (indicate cue type and reason): Total A for oral care with mouth swab and wash cloth                             Functional mobility during ADLs: Total assistance General ADL Comments: Pt continues with Total A for all ADLs     Vision   Vision Assessment?: Vision impaired- to be further tested in functional context;Yes   Perception     Praxis      Cognition Arousal/Alertness: Lethargic Behavior During Therapy: Flat affect Overall Cognitive Status: Difficult to assess                                 General Comments: Pt will respond to questions mouthing yes/no, but unable to follow commands to move extremities or squeeze hands when prompted        Exercises Exercises: Other exercises Other Exercises Other Exercises: PROM to B UE to assess  mobility and prevent contracture worsening   Shoulder Instructions       General Comments Pt with 110/50s BP at rest in bed, HR and SpO2 WFL. Session focused on splint protocol check with reports of pt wearing splint all night the previous night and to dialysis this AM before OT could provide splint check. Through collaboration, nursing staff doffed splints when pt returned from unit. Pt with small scab on radial side of L wrist and dark, discolored skin on palmart surface of R hand. Educated staff to leave splints off for remainder of day/night with RN passing along message.     Pertinent Vitals/ Pain       Pain Assessment: Faces Faces Pain Scale: Hurts even more Pain Location: generalized during ROM, mobility Pain Descriptors / Indicators: Discomfort;Grimacing;Guarding Pain Intervention(s): Limited activity within patient's tolerance;Monitored during session;Repositioned  Home Living                                           Prior Functioning/Environment              Frequency  Min 2X/week        Progress Toward Goals  OT Goals(current goals can now be found in the care plan section)  Progress towards OT goals: OT to reassess next treatment  Acute Rehab OT Goals Patient Stated Goal: not stated OT Goal Formulation: Patient unable to participate in goal setting Time For Goal Achievement: 04/02/20 Potential to Achieve Goals: Fair ADL Goals Pt Will Perform Upper Body Bathing: with max assist;bed level Additional ADL Goal #1: Demonstrate increased of R shoulder P/AAROM to 75 degrees after using mobilization techniques to increase functional use RUE Additional ADL Goal #2: Pt will complete hand to mouth pattern with mod A using L UE inpreparation for ADL tasks Additional ADL Goal #3: pt will tolerate bilateral resting hand splints 4 hours on/off schedule to maintain a stretch in hands and increase functional use.  Plan Discharge plan remains appropriate    Co-evaluation                 AM-PAC OT "6 Clicks" Daily Activity     Outcome Measure   Help from another person eating meals?: Total Help from another person taking care of personal grooming?: Total Help from another person toileting, which includes using toliet, bedpan, or urinal?: Total Help from another person bathing (including washing, rinsing, drying)?: Total Help from another person to put on and taking off regular upper body clothing?: Total Help from another person to put on and taking off regular lower body clothing?: Total 6 Click Score: 6    End of Session Equipment Utilized During Treatment: Oxygen  OT Visit Diagnosis: Other abnormalities of gait and mobility (R26.89);Muscle weakness (generalized) (M62.81);Other symptoms and signs involving cognitive function;Pain   Activity Tolerance Patient tolerated treatment well   Patient Left in bed;with call bell/phone within reach;with  SCD's reapplied   Nurse Communication Other (comment) (splinting, trach)        Time: 0938-1829 OT Time Calculation (min): 25 min  Charges: OT General Charges $OT Visit: 1 Visit OT Treatments $Orthotics/Prosthetics Check: 23-37 mins  Layla Maw, OTR/L   Layla Maw 04/01/2020, 3:52 PM   \

## 2020-04-01 NOTE — Progress Notes (Signed)
[facility] PROGRESS NOTE  Shane Banks VCB:[id number] DOB: 10-15-1945 DOA: 02/14/2020 PCP: [name], MD  Status: Inpatient--Remains inpatient appropriate because :Unsafe d/c plan, IV treatments appropriate due to intensity of illness or inability to take PO and Inpatient level of care appropriate due to severity of illness   Dispo:  Patient From:  Home  Planned Disposition: [place]  Expected discharge date: 04/01/20  Medically stable for discharge: No-needs to transition to cuffless trach, needs to be able to sit upright and recliner to pursue dialysis in the outpatient setting, progressive leukocytosis with infection work-up underway-remains on IV antibiotics secondary to sacral osteomyelitis and decubitus and recent bacteremia-   HPI: 74 y.o. male past medical history significant of chronic respiratory failure status post tracheostomy, end-stage renal disease on dialysis, chronic atrial fibrillation nonhemorrhagic stroke. Patient treated for Covid pneumonia January 2021 and had been on the vent because of progressive ARDS.  Because of lack of progression he underwent tracheostomy and was [facility].  Was sent back to this facility in May due to trach site bleeding suspected hemoptysis.  He was also noted with sacral decubitus at time of presentation in May.  Since admission he has been treated for multidrug-resistant Pseudomonas pneumonia and MRSA and enterococcal bacteremia.  On 03/15/2020 patient returned to the ICU for ventilatory and pressor support he developed septic shock and developed acute renal failure requiring dialysis.  He developed sepsis physiology likely secondary to sacral decubitus ulcer with underlying osteomyelitis.    He has been weaned from pressors. PCCM is following weekly for trach weaning with the assistance of SLP.  Patient was liberated from the vent on 03/21/2020 and was transferred to the floor in 03/22/2020.  Due  to the lack of insurance approval he cannot go [facility] to [facility].  Social worker is working on Clinical research associate.  He will need to be transition to a cuffless trach and be able to sit upright in a recliner for hemodialysis before he can discharge to skilled nursing facility.  Subjective: More alert today but remains very weak. No specific complaints elicited during interaction with patient.  Objective: Vitals:   04/01/20 0747 04/01/20 0924  BP: 137/72 134/63  Pulse: 88 87  Resp: (!) 23 (!) 24  Temp: 99 F (37.2 C)   SpO2: 97% 96%    Intake/Output Summary (Last 24 hours) at 04/01/2020 1013 Last data filed at 04/01/2020 0700 Gross per 24 hour  Intake 700 ml  Output 600 ml  Net 100 ml   Filed Weights   03/30/20 0500 03/31/20 0500 04/01/20 0513  Weight: 59.8 kg 60.3 kg 60.3 kg    Exam:  Constitutional: NAD, calm, comfortable Neck: normal, supple, no masses, no thyromegaly-midline cuffed trach in place  Respiratory: Coarse to auscultation anteriorly with more diffuse rhonchi and crackles bilaterally.  Normal respiratory effort.  Tachypneic with some use of accessory muscles which is baseline for this patient.  Trach collar oxygen/FiO2 28% Cardiovascular: Regular rate and rhythm, no murmurs / rubs / gallops. No extremity edema. 2+ pedal pulses. No carotid bruits.  Dialysis catheter right subclavian vein. Abdomen: no tenderness, no masses palpated. No hepatosplenomegaly. Bowel sounds positive.  Colostomy in place with watery brown with blobby stools.  Gastrostomy tube in place with tube feedings infusing GU: Condom catheter in place draining yellow-colored urine to bedside bag Musculoskeletal: no clubbing / cyanosis. No joint deformity upper and lower extremities.  Not spontaneously moving but with PROM has adequate range  of motion., no contractures.  Diminished muscle tone noting too weak to follow commands with upper extremities.  Skin: no rashes, lesions, sacral decubitus  not examined today Neurologic: CN 2-12 grossly intact. Sensation intact, DTR not assessed-to assess strength adequately given patient's inability to participate but he was unable to lift either arm off the bed for assessment and also was unable to comply with request to hold up the finger on either hand. 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  Pulmonary critical care to follow-up and advised and transition to cuffless trach.  He remains a full code. Last sputum culture on 6/19 with multidrug-resistant Pseudomonas and follow-up chest x-ray revealed diffuse left-sided airspace disease with patchy right airspace disease Repeat sputum culture obtained by RT on 7/5 Continue IV meropenem-WBC once again trending upward- now 18,000 axillary T-max in the past 24 hours 100 F Remains on trach collar 28% FiO2-await follow-up evaluation from PCCM Patient is not medically stable given need to transition to cuffless trach prior to transfer to skilled nursing facility.  Unfortunately given recurrent pneumonia and increased secretions with associated generalized weakness/weak cough it is highly doubtful he can transition to cuffless trach at this point.  Overall prognosis is poor.  He remains full code. Continue chest physiotherapy with vibra vest administered per RT Note, nursing staff documented thick, blood-tinged secretions from trach on the afternoon of 7/4 -no similar secretions today  Leukocytosis/mild funguria As above regarding possible pulmonary etiology to current leukocytosis trend Urinalysis abnormal concerning for possible UTI-urine culture positive for 30,000 colonies of yeast therefore will  treat with oral Diflucan for 3 days  Diarrhea Likely noninfectious in etiology-C. difficile negative Suspect related to antibiotics and other medications bolus tube feeding Appears to be slowing-fluids discontinued over the weekend since is dialysis patient  Sacral osteomyelitis and HD catheter sepsis/  Enterococcal bacteremia: Tunneled catheter removal on 02/21/2020. Will require 6 weeks of IV antibiotics per ID (vancomycin and Flagyl.)  Paroxysmal atrial fibrillation: Currently rate controlled on metoprolol Anticoagulation was discontinued due to suspected GI bleed.  End-stage renal disease: On HD since March 2021 status post tunneled catheter placement 02/28/2020 and as of 7/1 site unremarkable. Dialysis days: MWF SNF reports patient will need to be able to sit upright in recliner to receive hemodialysis before they will accept him as a patient  Chronic normocytic anemia: Due to anemia of chronic renal disease Status post1 unit of packed red blood cells  Continue erythropoietin and IV iron per renal.  History of nonhemorrhagic CVA: CT of the head showed atrophy and chronic microvascular disease. Not on anticoagulation due to possible GI bleed.  Unstageable sacral pressure ulcer with osteomyelitis: Continue wound care recommendations.  Severe protein caloric malnutrition: Continue tube feedings. Nutrition Status: Nutrition Problem: Severe Malnutrition Etiology: chronic illness (chronic respiratory failure related to COVID ARDS) Signs/Symptoms: severe muscle depletion, severe fat depletion Interventions: Prostat, Tube feeding, Juven      Data Reviewed: Basic Metabolic Panel: Recent Labs  Lab 03/27/20 0245 03/27/20 2117 03/28/20 0233 03/29/20 0446 03/30/20 0347 03/31/20 0625 04/01/20 0435  NA   < >  --  136 137 138 137 136  K   < > 3.5 3.3* 3.4* 4.1 4.6 4.0  CL   < >  --  99 99 102  103 103  CO2   < >  --  27 28 25  21* 22  GLUCOSE   < >  --  123* 132* 133*  115* 129*  BUN   < >  --  66* 101* 43* 79* 118*  CREATININE   < >  --  2.20* 3.11* 1.91* 2.74* 3.24*  CALCIUM   < >  --  9.1 9.5 9.1 9.1 9.3  MG  --  1.7  --   --   --   --   --   PHOS   < >  --  2.7 3.5 1.9* 3.4 4.0   < > = values in this interval not displayed.   Liver Function Tests: Recent Labs  Lab 03/25/20 1053 03/26/20 0221 03/28/20 0233 03/29/20 0446 03/30/20 0347 03/31/20 0625 04/01/20 0435  AST 70*  --   --   --   --   --   --   ALT 16  --   --   --   --   --   --   ALKPHOS 101  --   --   --   --   --   --   BILITOT 1.1  --   --   --   --   --   --   PROT 7.8  --   --   --   --   --   --   ALBUMIN 2.2*   < > 1.9* 1.8* 2.2* 1.9* 1.9*   < > = values in this interval not displayed.   No results for input(s): LIPASE, AMYLASE in the last 168 hours. No results for input(s): AMMONIA in the last 168 hours. CBC: Recent Labs  Lab 03/27/20 0245 03/27/20 0245 03/28/20 0233 03/29/20 0446 03/30/20 0347 03/31/20 1026 04/01/20 0435  WBC 16.7*   < > 18.4* 16.0* 18.0* 17.2* 18.0*  NEUTROABS 10.9*  --  12.5* 11.4* 12.9* 12.0*  --   HGB 9.6*   < > 9.6* 9.8* 9.8* 9.7* 9.5*  HCT 30.9*   < > 31.3* 32.0* 32.2* 31.1* 30.9*  MCV 81.7   < > 82.2 82.7 82.6 81.2 82.6  PLT 273   < > 228 257 225 267 222   < > = values in this interval not displayed.   Cardiac Enzymes: No results for input(s): CKTOTAL, CKMB, CKMBINDEX, TROPONINI in the last 168 hours. BNP (last 3 results) No results for input(s): BNP in the last 8760 hours.  ProBNP (last 3 results) No results for input(s): PROBNP in the last 8760 hours.  CBG: Recent Labs  Lab 03/31/20 1749 03/31/20 2017 03/31/20 2327 04/01/20 0440 04/01/20 0816  GLUCAP 121* 155* 122* 135* 105*    Recent Results (from the past 240 hour(s))  MRSA PCR Screening     Status: None   Collection Time: 03/27/20  7:59 PM   Specimen: Nasal Mucosa; Nasopharyngeal  Result Value Ref Range Status   MRSA by PCR NEGATIVE NEGATIVE Final    Comment:         The GeneXpert MRSA Assay (FDA approved for NASAL specimens only), is one component of a comprehensive MRSA colonization surveillance program. It is not intended to diagnose MRSA infection nor to guide or monitor treatment for MRSA infections. Performed at [facility], [place] [address] [place], [place] [address]   Culture, Urine     Status: Abnormal   Collection Time: 03/28/20  2:10 PM   Specimen: Urine, Random  Result  Value Ref Range Status   Specimen Description URINE, RANDOM  Final   Special Requests   Final    NONE Performed at [facility], [place] [address] [place], [place] [address]    Culture 30,000 COLONIES/mL YEAST (A)  Final   Report Status 03/29/2020 FINAL  Final  C Difficile Quick Screen (NO PCR Reflex)     Status: None   Collection Time: 03/29/20 10:20 AM   Specimen: STOOL  Result Value Ref Range Status   C Diff antigen NEGATIVE NEGATIVE Final   C Diff toxin NEGATIVE NEGATIVE Final   C Diff interpretation No C. difficile detected.  Final    Comment: Performed at [facility], [place] [address] [place], [place] [address]     Studies: No results found.  Scheduled Meds: . apixaban  2.5 mg Per Tube BID  . chlorhexidine  15 mL Mouth Rinse BID  . chlorhexidine gluconate (MEDLINE KIT)  15 mL Mouth Rinse BID  . Chlorhexidine Gluconate Cloth  6 each Topical Q0600  . darbepoetin (ARANESP) injection - DIALYSIS  100 mcg Intravenous Q Mon-HD  . feeding supplement (PRO-STAT SUGAR FREE 64)  30 mL Per Tube BID  . heparin sodium (porcine)      . insulin aspart  0-6 Units Subcutaneous Q4H  . insulin aspart  2 Units Subcutaneous Q4H  . levothyroxine  25 mcg Per Tube Q0600  . mouth rinse  15 mL Mouth Rinse q12n4p  . metoprolol tartrate  25 mg Per Tube BID  . nutrition supplement (JUVEN)  1 packet Per Tube BID BM  . pantoprazole sodium  40 mg Per Tube Daily  . phenylephrine 200 mcg / ml (NON-ED USE ONLY) injection  100 mcg Intracavernosal Once  .  vancomycin  500 mg Intravenous Q M,W,F-HD   Continuous Infusions: . feeding supplement (NEPRO CARB STEADY) 1,000 mL (04/01/20 0843)  . meropenem (MERREM) IV 500 mg (03/31/20 2105)    Principal Problem:   Enterococcal bacteremia Active Problems:   Acute respiratory failure (HCC)   Hemoptysis   Pressure injury of skin   Protein-calorie malnutrition, severe   Decubitus ulcer of sacral region, stage 4 (HCC)   Fever   Palliative care by specialist   Goals of care, counseling/discussion   DNR (do not resuscitate)     Code Status: Full Family Communication: [name] sister 03/29/20    Consultants: PCCM  Procedures:  Echocardiogram  Antibiotics: Anti-infectives (From admission, onward)   Start     Dose/Rate Route Frequency Ordered Stop   03/29/20 1552  vancomycin (VANCOCIN) 500-5 MG/100ML-% IVPB       Note to Pharmacy: Herriott, Melisa   : cabinet override      03/29/20 1552 03/29/20 1556   03/29/20 1415  fluconazole (DIFLUCAN) 40 MG/ML suspension 100 mg        100 mg Oral Daily 03/29/20 1408 03/31/20 1015   03/28/20 2000  meropenem (MERREM) 500 mg in sodium chloride 0.9 % 100 mL IVPB     Discontinue     500 mg 200 mL/hr over 30 Minutes Intravenous Every 24 hours 03/28/20 1623 04/04/20 2359   03/25/20 1800  cefTAZidime (FORTAZ) 1 g in sodium chloride 0.9 % 100 mL IVPB  Status:  Discontinued       "Followed by" Linked Group Details   1 g 200 mL/hr over 30 Minutes Intravenous Every 24 hours 03/18/20 1015 03/28/20 1613   03/22/20 0830  vancomycin (VANCOREADY) IVPB 500 mg/100 mL  500 mg 100 mL/hr over 60 Minutes Intravenous  Once 03/22/20 0827 03/22/20 1014   03/20/20 1200  vancomycin (VANCOCIN) IVPB 500 mg/100 ml premix     Discontinue     500 mg 100 mL/hr over 60 Minutes Intravenous Every M-W-F (Hemodialysis) 03/19/20 0941 04/04/20 2359   03/18/20 1800  meropenem (MERREM) 500 mg in sodium chloride 0.9 % 100 mL IVPB       "Followed by" Linked Group Details    500 mg 200 mL/hr over 30 Minutes Intravenous Every 24 hours 03/18/20 1015 03/25/20 0026   03/18/20 1200  vancomycin (VANCOCIN) IVPB 500 mg/100 ml premix        500 mg 100 mL/hr over 60 Minutes Intravenous Every M-W-F (Hemodialysis) 03/18/20 0938 03/18/20 1700   03/18/20 1200  cefTAZidime (FORTAZ) 2 g in sodium chloride 0.9 % 100 mL IVPB  Status:  Discontinued        2 g 200 mL/hr over 30 Minutes Intravenous Every Mon (Hemodialysis) 03/18/20 0939 03/18/20 1015   03/07/20 1800  cefTAZidime (FORTAZ) 2 g in sodium chloride 0.9 % 100 mL IVPB  Status:  Discontinued        2 g 200 mL/hr over 30 Minutes Intravenous Every T-Th-Sa (1800) 03/06/20 1255 03/18/20 1015   03/07/20 1200  vancomycin (VANCOCIN) IVPB 500 mg/100 ml premix  Status:  Discontinued        500 mg 100 mL/hr over 60 Minutes Intravenous Every T-Th-Sa (Hemodialysis) 03/06/20 1255 03/19/20 0941   03/04/20 2000  cefTAZidime (FORTAZ) 2 g in sodium chloride 0.9 % 100 mL IVPB  Status:  Discontinued        2 g 200 mL/hr over 30 Minutes Intravenous Every M-W-F (2000) 03/01/20 1136 03/06/20 1255   03/02/20 1200  vancomycin (VANCOCIN) IVPB 500 mg/100 ml premix        500 mg 100 mL/hr over 60 Minutes Intravenous Every T-Th-Sa (Hemodialysis) 03/01/20 1131 03/02/20 1710   03/01/20 1230  vancomycin (VANCOCIN) IVPB 500 mg/100 ml premix  Status:  Discontinued        500 mg 100 mL/hr over 60 Minutes Intravenous Every M-W-F (Hemodialysis) 03/01/20 1131 03/06/20 1255   02/28/20 1110  vancomycin (VANCOREADY) IVPB 500 mg/100 mL        over 60 Minutes  Continuous PRN 02/28/20 1112 02/28/20 1110   02/28/20 1101  vancomycin (VANCOCIN) 1-5 GM/200ML-% IVPB  Status:  Discontinued       Note to Pharmacy: Lytle Butte   : cabinet override      02/28/20 1101 02/28/20 1327   02/27/20 1800  cefTAZidime (FORTAZ) 1 g in sodium chloride 0.9 % 100 mL IVPB        1 g 200 mL/hr over 30 Minutes Intravenous Every 24 hours 02/27/20 0947 03/03/20 1738   02/27/20 1200   vancomycin (VANCOCIN) IVPB 500 mg/100 ml premix        500 mg 100 mL/hr over 60 Minutes Intravenous Once 02/27/20 0829 02/27/20 1343   02/24/20 1800  vancomycin (VANCOREADY) IVPB 500 mg/100 mL        500 mg 100 mL/hr over 60 Minutes Intravenous  Once 02/24/20 1308 02/24/20 1905   02/22/20 2200  metroNIDAZOLE (FLAGYL) tablet 500 mg  Status:  Discontinued        500 mg Per Tube Every 8 hours 02/22/20 0957 03/30/20 1057   02/22/20 1700  cefTRIAXone (ROCEPHIN) 2 g in sodium chloride 0.9 % 100 mL IVPB  Status:  Discontinued  2 g 200 mL/hr over 30 Minutes Intravenous Every 24 hours 02/22/20 0957 02/27/20 0947   02/22/20 1200  vancomycin (VANCOCIN) IVPB 500 mg/100 ml premix  Status:  Discontinued        500 mg 100 mL/hr over 60 Minutes Intravenous Every T-Th-Sa (Hemodialysis) 02/20/20 1253 02/21/20 1040   02/21/20 1431  vancomycin variable dose per unstable renal function (pharmacist dosing)  Status:  Discontinued         Does not apply See admin instructions 02/21/20 1431 03/01/20 1131   02/21/20 1200  vancomycin (VANCOREADY) IVPB 500 mg/100 mL        500 mg 100 mL/hr over 60 Minutes Intravenous  Once 02/21/20 0956 02/21/20 1253   02/21/20 0714  vancomycin (VANCOCIN) 500-5 MG/100ML-% IVPB  Status:  Discontinued       Note to Pharmacy: Ashley Akin   : cabinet override      02/21/20 0714 02/21/20 1346   02/20/20 1400  vancomycin (VANCOREADY) IVPB 1250 mg/250 mL        1,250 mg 166.7 mL/hr over 90 Minutes Intravenous  Once 02/20/20 1253 02/20/20 1538   02/20/20 1200  levofloxacin (LEVAQUIN) IVPB 500 mg  Status:  Discontinued        500 mg 100 mL/hr over 60 Minutes Intravenous Every 48 hours 02/20/20 1020 02/21/20 0943   02/15/20 1545  vancomycin (VANCOCIN) IVPB 1000 mg/200 mL premix  Status:  Discontinued       "Followed by" Linked Group Details   1,000 mg 200 mL/hr over 60 Minutes Intravenous Every 24 hours 02/14/20 1530 02/14/20 1745   02/15/20 0330  ceFEPIme (MAXIPIME) 2 g in  sodium chloride 0.9 % 100 mL IVPB  Status:  Discontinued        2 g 200 mL/hr over 30 Minutes Intravenous Every 12 hours 02/14/20 1531 02/14/20 1531   02/14/20 1531  ceFEPIme (MAXIPIME) 2 g in sodium chloride 0.9 % 100 mL IVPB  Status:  Discontinued        2 g 200 mL/hr over 30 Minutes Intravenous Every 12 hours 02/14/20 1531 02/14/20 1745   02/14/20 1530  ceFEPIme (MAXIPIME) 2 g in sodium chloride 0.9 % 100 mL IVPB  Status:  Discontinued        2 g 200 mL/hr over 30 Minutes Intravenous  Once 02/14/20 1520 02/14/20 1531   02/14/20 1530  vancomycin (VANCOREADY) IVPB 1500 mg/300 mL  Status:  Discontinued       "Followed by" Linked Group Details   1,500 mg 150 mL/hr over 120 Minutes Intravenous  Once 02/14/20 1530 02/14/20 1745        Time spent: 25 minutes    Erin Hearing ANP [facility] Pager [phone]. If 7PM-7AM, please contact night-coverage at [email or web address] 04/01/2020, 10:13 AM  LOS: 47 days

## 2020-04-02 ENCOUNTER — Inpatient Hospital Stay (HOSPITAL_COMMUNITY): Payer: Medicare HMO

## 2020-04-02 LAB — BLOOD GAS, ARTERIAL
Acid-Base Excess: 2.4 mmol/L — ABNORMAL HIGH (ref 0.0–2.0)
Bicarbonate: 28.1 mmol/L — ABNORMAL HIGH (ref 20.0–28.0)
Drawn by: 519031
FIO2: 60
O2 Saturation: 94.8 %
Patient temperature: 36.5
pCO2 arterial: 56 mmHg — ABNORMAL HIGH (ref 32.0–48.0)
pH, Arterial: 7.318 — ABNORMAL LOW (ref 7.350–7.450)
pO2, Arterial: 76.1 mmHg — ABNORMAL LOW (ref 83.0–108.0)

## 2020-04-02 LAB — COMPREHENSIVE METABOLIC PANEL
ALT: 23 U/L (ref 0–44)
AST: 33 U/L (ref 15–41)
Albumin: 2 g/dL — ABNORMAL LOW (ref 3.5–5.0)
Alkaline Phosphatase: 110 U/L (ref 38–126)
Anion gap: 10 (ref 5–15)
BUN: 74 mg/dL — ABNORMAL HIGH (ref 8–23)
CO2: 26 mmol/L (ref 22–32)
Calcium: 9.1 mg/dL (ref 8.9–10.3)
Chloride: 97 mmol/L — ABNORMAL LOW (ref 98–111)
Creatinine, Ser: 2.45 mg/dL — ABNORMAL HIGH (ref 0.61–1.24)
GFR calc Af Amer: 29 mL/min — ABNORMAL LOW (ref >60)
GFR calc non Af Amer: 25 mL/min — ABNORMAL LOW (ref >60)
Glucose, Bld: 130 mg/dL — ABNORMAL HIGH (ref 70–99)
Potassium: 3.9 mmol/L (ref 3.5–5.1)
Sodium: 133 mmol/L — ABNORMAL LOW (ref 135–145)
Total Bilirubin: 0.7 mg/dL (ref 0.3–1.2)
Total Protein: 7.8 g/dL (ref 6.5–8.1)

## 2020-04-02 LAB — CBC
HCT: 31.2 % — ABNORMAL LOW (ref 39.0–52.0)
Hemoglobin: 9.8 g/dL — ABNORMAL LOW (ref 13.0–17.0)
MCH: 25.4 pg — ABNORMAL LOW (ref 26.0–34.0)
MCHC: 31.4 g/dL (ref 30.0–36.0)
MCV: 80.8 fL (ref 80.0–100.0)
Platelets: 248 10*3/uL (ref 150–400)
RBC: 3.86 MIL/uL — ABNORMAL LOW (ref 4.22–5.81)
RDW: 18.5 % — ABNORMAL HIGH (ref 11.5–15.5)
WBC: 20.2 10*3/uL — ABNORMAL HIGH (ref 4.0–10.5)
nRBC: 0 % (ref 0.0–0.2)

## 2020-04-02 LAB — GLUCOSE, CAPILLARY
Glucose-Capillary: 123 mg/dL — ABNORMAL HIGH (ref 70–99)
Glucose-Capillary: 128 mg/dL — ABNORMAL HIGH (ref 70–99)
Glucose-Capillary: 121 mg/dL — ABNORMAL HIGH (ref 70–99)
Glucose-Capillary: 133 mg/dL — ABNORMAL HIGH (ref 70–99)
Glucose-Capillary: 132 mg/dL — ABNORMAL HIGH (ref 70–99)
Glucose-Capillary: 131 mg/dL — ABNORMAL HIGH (ref 70–99)

## 2020-04-02 MED ORDER — DEXTROSE-NACL 5-0.9 % IV SOLN: INTRAVENOUS | Status: DC

## 2020-04-02 MED ORDER — CHLORHEXIDINE GLUCONATE CLOTH 2 % EX PADS: 6.0000 | MEDICATED_PAD | Freq: Every day | CUTANEOUS | Status: DC

## 2020-04-02 NOTE — Progress Notes (Signed)
PCCM Brief Progress Note  Called to re-evaluate patient.  In Brief, patient is post-COVID s/p tracheostomy-- trach dependent. He has largely been on trach collar since 6/7. On 7/6, patient with increasing RR, copious secretions, possible aspiration, hypoxia. PCCM consulted this morning for possible ICU transfer, and recommended aggressive pulm hygiene.   Over course of afternoon, patient has had episodic hypoxia and tachypnea which have improved with aggressive suctioning. He has had CPT 1x, mid day. PCCM asked to re-evaluate in setting of ongoing secretion burden with associated hypoxia.  On my arrival SpO2 98%, FiO2 60%. RR 19. HR 90s, BP 105/50 . ABG 7.318/56/76/2.4/28.1  Copious tan secretions, no accessory muscle use. Bilateral rhonchi.  RRR s1s2. Cap refill brisk  Chronically ill appearing NAD  Soft flat abdomen No cyanosis or edema    Chronic respiratory failure s/p tracheostomy with hypoxia HCAP + Likely aspiration PNA  P -Continue aggressive pulm hygiene, anticipate will need frequent suctioning -Can increase CPT -At this time, will not transfer to ICU as I do not think putting back on vent will provide benefit -- continue to evaluate as it is possible patient may decline and warrant MV support -Cont abx  -Wean O2 as able for SpO2 goal >92%     PCCM will continue to follow for trach care, and will plan to see again 04/03/20 If concerns for worsening respiratory status arise, please page for PCCM to re-evaluate    Eliseo Gum MSN, AGACNP-BC [place] 04/02/2020, 4:22 PM

## 2020-04-02 NOTE — Plan of Care (Signed)

## 2020-04-02 NOTE — Progress Notes (Signed)
Occupational Therapy Treatment Patient Details Name: Saim Almanza MRN: [id number] DOB: [birth year] Today's Date: 04/02/2020    History of present illness 74 yo m with PMHx of afib with RVR, BPH, stroke, and COVID ARDS in January requiring trach and LTACH placement at [facility]. Had been tolerating trach collar, but on 5/19 developed hemoptysis and hypoxemic respiratory failure requiring vent. Transferred to [facility] on 5/19.  5/19 back on vent, 6/7 transitioned to [facility]   OT comments  Re-assessed skin integrity today with increased swelling noted in B hands today. Re-positioned B UEs on pillows bed level in collaboration with nursing staff. After re-assessment, discharged B resting hand splints with focus on keeping B UEs propped up on pillows with recommendations of daily PROM to prevent contracture progression. Also recommend air mattress as pt with unstageable sacral wound. Will follow up with pt at end of week for continued positioning efforts.   If positioning successful in managing swelling, comfort, and joint integrity, will re-assess for DC from OT services next week. Pt with recent rapid response this AM and continual functional decline. Pt with minimal to no progress with skilled OT services and continues to require Total care for ADLs.    Follow Up Recommendations  SNF;LTACH;Supervision/Assistance - 24 hour    Equipment Recommendations  None recommended by OT    Recommendations for Other Services      Precautions / Restrictions Precautions Precautions: Fall;Other (comment) Precaution Comments: trach collar, PEG Restrictions Weight Bearing Restrictions: No       Mobility Bed Mobility                  Transfers                 General transfer comment: deferred     Balance                                           ADL either performed or assessed with clinical judgement   ADL Overall ADL's : Needs assistance/impaired                                        General ADL Comments: Pt continues with Total A for all ADLs     Vision   Vision Assessment?: Vision impaired- to be further tested in functional context;Yes   Perception     Praxis      Cognition Arousal/Alertness: Lethargic Behavior During Therapy: Flat affect Overall Cognitive Status: Difficult to assess                                 General Comments: minimal eye opening or responses today        Exercises     Shoulder Instructions       General Comments Assessed skin integrity and positioning today with increased edema in B hands. In collaboration with nursing, placed pillows underneath B UE to decrease swelling. Based on pt's high risk for skin breakdown and difficulty with splinting carryover, DC resting hand splints (order discontinued). Pt with recent rapid response this AM, continues to decline and no functional gains with therapy    Pertinent Vitals/ Pain       Pain Assessment: Faces Faces Pain Scale: Hurts  little more Pain Location: generalized during ROM, mobility Pain Descriptors / Indicators: Discomfort;Grimacing;Guarding Pain Intervention(s): Limited activity within patient's tolerance;Monitored during session;Repositioned  Home Living                                          Prior Functioning/Environment              Frequency  Min 2X/week        Progress Toward Goals  OT Goals(current goals can now be found in the care plan section)  Progress towards OT goals: Not progressing toward goals - comment  Acute Rehab OT Goals Patient Stated Goal: not stated OT Goal Formulation: Patient unable to participate in goal setting Time For Goal Achievement: 04/09/20 Potential to Achieve Goals: Fair ADL Goals Additional ADL Goal #1: Demonstrate increased of R shoulder P/AAROM to 75 degrees after using mobilization techniques to increase functional use RUE Additional  ADL Goal #4: Caregivers to demonstrate appropriate positioning techniques to preserve joint integrity, prevent contracture progression and decrease swelling  Plan Discharge plan remains appropriate    Co-evaluation                 AM-PAC OT "6 Clicks" Daily Activity     Outcome Measure   Help from another person eating meals?: Total Help from another person taking care of personal grooming?: Total Help from another person toileting, which includes using toliet, bedpan, or urinal?: Total Help from another person bathing (including washing, rinsing, drying)?: Total Help from another person to put on and taking off regular upper body clothing?: Total Help from another person to put on and taking off regular lower body clothing?: Total 6 Click Score: 6    End of Session Equipment Utilized During Treatment: Oxygen  OT Visit Diagnosis: Other abnormalities of gait and mobility (R26.89);Muscle weakness (generalized) (M62.81);Other symptoms and signs involving cognitive function;Pain Pain - Right/Left: Right Pain - part of body: Shoulder;Arm;Hand;Hip;Knee;Leg   Activity Tolerance Patient limited by fatigue;Patient limited by lethargy   Patient Left in bed;with call bell/phone within reach;with nursing/sitter in room   Nurse Communication Other (comment) (discontinued splint orders, positioning recommendations)        Time: 1350-1405 OT Time Calculation (min): 15 min  Charges: OT General Charges $OT Visit: 1 Visit OT Treatments $Therapeutic Activity: 8-22 mins  Layla Maw, OTR/L   Layla Maw 04/02/2020, 2:48 PM

## 2020-04-02 NOTE — Progress Notes (Signed)
NAME:  Shane Banks, MRN:  [id number], DOB:  [birth year], LOS: 27 ADMISSION DATE:  02/14/2020, CONSULTATION DATE:  5/19 REFERRING MD:  Dr. Alvino Chapel, CHIEF COMPLAINT:  Hemoptysis   Brief History   74 yo m with PMHx of afib with RVR, BPH, stroke, and COVID ARDS in January requiring trach and LTACH placement at [facility]. Had been tolerating trach collar, but on 5/19 developed hemoptysis and hypoxemic respiratory failure requiring vent. Transferred to [facility] on 5/19.  Past Medical History   has a past medical history of Acute on chronic respiratory failure with hypoxia ([place]), Atrial fibrillation with RVR ([place]), BPH (benign prostatic hyperplasia), COVID-19 virus infection, Pneumonia due to COVID-19 virus, Severe sepsis ([place]), and Stroke ([place]).  Significant Hospital Events   1/8  Admit to [facility] for COVID PNA 2/23 Transfer to [facility] on vent 5/19 Transfer to [facility] for hemoptysis. #6 cuffed trach placed. Back on vent. Bronch performed 5/20 Transitioned to trach collar.  5/22 back on vent full time 5/24 back on antibiotics for fevers 6/1 Initiating PSV weans 6/3 Large cuff leak, trach exchange 6/7 transitioned to [facility] 6/9 remains on TC continuously since 6/7 6/23-tolerating trach collar today 6/25-left of the ventilator last evening and tolerated well 04/02/2020 new episode of hypoxia copious secretions tachypnea questionable need to return to ICU pulmonary critical care reconsulted  Consults:  ENT Nephrology ID PCCM  Procedures:  ENT flex scope 5/19 > no bleeding source identified FOB 5/19 > no pulmonary bleeding source identified.   Significant Diagnostic Tests:  CXR 5/28 >> R CVC placement confirmed Slightly improved aeration at the right base. Left basilar consolidation and probable effusion, in addition to the remaining pulmonary infiltrates are otherwise unchanged  5/28 MR Sacrum SI Joints WO Contrast Osteomyelitis of the fifth sacral  segment. Edema in the muscles around the hips and in the posterior paraspinal musculature of the lower lumbar spine and in the buttocks which could represent myositis. Fluid-fluid level in the bladder may represent protein or debris in the bladder. The possibility of urinary tract infection should be considered.  CXR 6/3> improved R lung aeration, worsening L sided opacity. Tracheostomy tube, HD catheter.   CXR 6/4>wosened R lung infiltrates and stable L sided infiltrate. Possible bilateral pleural effusions  Chest x-ray 6/24-basal infiltrates, stable from prior  Micro Data:    tracheal aspirate 5/7> for Pseudomonas A  BAL 5/19 > no growth 5/19 blood > NG 5/22 trach aspirate>> candida [place] 5/22 blood>> 1/4 GPC> staph epi 5/25 blood>> GPC (enterococcus on biofire) 2/4 bottles>> E. Faecalis, staph epi. 5/25 trach aspirate>>many PMN, few GPR, rare GPC> diphtheroids 5/25 blood cx>> + for Enterococcus Faecalis (gent resistant)/ Staphylococcus Epidermidis (tetracycline, vanc, rifampin sensitive) 5/27 BCx> ngtd  04/01/2020 sputum culture Gram>>  Antimicrobials:  Levofloxacin 5/24>5/26 vanc 5/25>off 7/5>> Ceftriaxone  5/27>6/1 Metronidazole 5/27> off Fortaz 6/1>off Meropenem 03/28/2020>>    Interim history/subjective:  Requiring increased FiO2 noted to have copious secretions suspected continued aspiration and most likely recurrent Pseudomonas infection collar  Objective   Blood pressure 125/64, pulse (!) 112, temperature 97.7 F (36.5 C), temperature source Axillary, resp. rate (!) 22, height _0  (1.753 m), weight 60.3 kg, SpO2 93 %.    FiO2 (%):  [28 %-100 %] 60 %   Intake/Output Summary (Last 24 hours) at 04/02/2020 1003 Last data filed at 04/01/2020 1800 Gross per 24 hour  Intake 554.17 ml  Output 1300 ml  Net -745.83 ml   Filed Weights   03/31/20  0500 04/01/20 0513 04/02/20 0323  Weight: 60.3 kg 60.3 kg 60.3 kg    Examination:   General frail elderly male  sitting straight up to Copious tracheal secretions rattling Tracheostomy is in place copious white secretions Chest coarse rhonchi bilaterally Cardiac heart sounds are distant Abdomen with PEG in place currently off tube feedings Extremities without edema       Chest x-ray 6/24 reviewed-stable findings reviewed by myself Resolved Hospital Problem list     Assessment & Plan:   Acute on chronic hypoxemic respiratory failure requiring tracheostomy, prolonged MV COVID fibrosis,  Deconditioning multiple HCAPs. 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 Continue current support    Chronic osteomyelitis. Continue antimicrobial therapy  End-stage renal disease on hemodialysis. Per nephrology   Pulmonary critical care is to see once weekly for trach care.  Richardson Landry Ziyah Cordoba ACNP Acute Care Nurse Practitioner [place] Please consult [place] 04/02/2020, 10:04 AM

## 2020-04-02 NOTE — Progress Notes (Signed)
Dr. Myna Hidalgo called back and stated he's coming to see the patient. Shane Banks

## 2020-04-02 NOTE — Progress Notes (Signed)
[facility] NEPHROLOGY PROGRESS NOTE  Assessment/ Plan: Pt is a 74 y.o. yo male  who initially came from [facility] [place]  hospitalization which included mech ventilation and trach placement. At [facility] had severe infections/ bacteremia/ PNA and developed AKI felt due to IV gent toxicity requiring initiation of HD 12/16/19. Admitted to ICU [facility] for trach bleed on 5/19. Was getting MWF HD at [facility] prior to admit here.   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 On TC again. Full code now. CXR had bilat infiltrates, continues on broad-spec IV abx thru 7/8.   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   # Sacral osteo/ decub stage IV - as per #2  # ESRD - on HD since mid March 2021. HD MWF at [facility]. S/P new TDC 6/2 by IR.  Status post HD yesterday with 1 L UF.  Next HD tomorrow.  # BP/vol status: Ultrafiltration during dialysis. The high BUN is not pre-renal as he has virtually no renal fxn, suspect hypercatabolic given infections and tissue necrosis/ decub.  # Atrial fib - per primary, on eliquis 2.5 BID  # Anemia ckd - transfuse prn, Hb remains 9- 10 range. on ESA  # Hypercalcemia: w/ low PTH, prob from immobility, improved. Cont low 2.0 Ca++ bath , PTH 29.   # SP peg/ divert colostomy  # Disp- LTACH only option for disposition given needs but apparently not a candidate.   Subjective: Seen and examined.  Remains on trach.  Had desaturation last night, getting x-ray.   Objective Vital signs in last 24 hours: Vitals:   04/02/20 0740 04/02/20 0746 04/02/20 0755 04/02/20 0805  BP: 112/60 112/60 125/64   Pulse: 87 89 89 (!) 112  Resp: 12 15 (!) 30 (!) 22  Temp:      TempSrc:      SpO2: 96% 97% 92% 93%  Weight:      Height:        Weight change: 0 kg  Intake/Output Summary (Last 24 hours) at 04/02/2020 0925 Last data filed at 04/01/2020 1800 Gross per 24 hour  Intake 554.17 ml  Output 1300 ml  Net -745.83 ml       Labs: Basic Metabolic Panel: Recent Labs  Lab 03/30/20 0347 03/30/20 0347 03/31/20 0625 04/01/20 0435 04/02/20 0807  NA 138   < > 137 136 133*  K 4.1   < > 4.6 4.0 3.9  CL 102   < > 103 103 97*  CO2 25   < > 21* 22 26  GLUCOSE 133*   < > 115* 129* 130*  BUN 43*   < > 79* 118* 74*  CREATININE 1.91*   < > 2.74* 3.24* 2.45*  CALCIUM 9.1   < > 9.1 9.3 9.1  PHOS 1.9*  --  3.4 4.0  --    < > = values in this interval not displayed.   Liver Function Tests: Recent Labs  Lab 03/31/20 0625 04/01/20 0435 04/02/20 0807  AST  --   --  33  ALT  --   --  23  ALKPHOS  --   --  110  BILITOT  --   --  0.7  PROT  --   --  7.8  ALBUMIN 1.9* 1.9* 2.0*  No results for input(s): LIPASE, AMYLASE in the last 168 hours. No results for input(s): AMMONIA in the last 168 hours. CBC: Recent Labs  Lab 03/29/20 0446 03/29/20 0446 03/30/20 0347 03/30/20 0347 03/31/20 1026 04/01/20 0435 04/02/20 0807  WBC 16.0*   < > 18.0*   < > 17.2* 18.0* 20.2*  NEUTROABS 11.4*  --  12.9*  --  12.0*  --   --   HGB 9.8*   < > 9.8*   < > 9.7* 9.5* 9.8*  HCT 32.0*   < > 32.2*   < > 31.1* 30.9* 31.2*  MCV 82.7  --  82.6  --  81.2 82.6 80.8  PLT 257   < > 225   < > 267 222 248   < > = values in this interval not displayed.   Cardiac Enzymes: No results for input(s): CKTOTAL, CKMB, CKMBINDEX, TROPONINI in the last 168 hours. CBG: Recent Labs  Lab 04/01/20 1558 04/01/20 2034 04/01/20 2330 04/02/20 0431 04/02/20 0806  GLUCAP 161* 156* 112* 123* 128*    Iron Studies: No results for input(s): IRON, TIBC, TRANSFERRIN, FERRITIN in the last 72 hours. Studies/Results: No results found.  Medications: Infusions: . dextrose 5 % and 0.9% NaCl 30 mL/hr at 04/02/20 0922  . feeding supplement (NEPRO CARB STEADY)  1,000 mL (04/02/20 0537)  . meropenem (MERREM) IV 200 mL/hr at 04/01/20 2056    Scheduled Medications: . apixaban  2.5 mg Per Tube BID  . chlorhexidine  15 mL Mouth Rinse BID  . chlorhexidine gluconate (MEDLINE KIT)  15 mL Mouth Rinse BID  . Chlorhexidine Gluconate Cloth  6 each Topical Q0600  . darbepoetin (ARANESP) injection - DIALYSIS  100 mcg Intravenous Q Mon-HD  . feeding supplement (PRO-STAT SUGAR FREE 64)  30 mL Per Tube BID  . insulin aspart  0-6 Units Subcutaneous Q4H  . insulin aspart  2 Units Subcutaneous Q4H  . levothyroxine  25 mcg Per Tube Q0600  . mouth rinse  15 mL Mouth Rinse q12n4p  . metoprolol tartrate  25 mg Per Tube BID  . nutrition supplement (JUVEN)  1 packet Per Tube BID BM  . pantoprazole sodium  40 mg Per Tube Daily  . phenylephrine 200 mcg / ml (NON-ED USE ONLY) injection  100 mcg Intracavernosal Once  . vancomycin  500 mg Intravenous Q M,W,F-HD    have reviewed scheduled and prn medications.  Physical Exam: General: Ill-looking male with a tracheostomy, only opens eyes with the name. Heart:RRR, s1s2 nl Lungs: Bibasal coarse breath sound, no wheezing Abdomen:soft, colostomy bag noted.  Also has feeding tube. Extremities:No edema Dialysis Access: Right IJ TDC.  Sheriece Jefcoat Prasad Berneda Piccininni 04/02/2020,9:25 AM  LOS: 48 days  Pager: [phone]

## 2020-04-02 NOTE — Significant Event (Addendum)
Rapid Response Follow-up Note  Arrival: 0700 Departure:  0830  Dr. Olevia Bowens at bedside to evaluate pt. Pt taking shallow breaths, respiratory rate initially 8-12 breaths/min. Pt is lethargic, provides grimace to pain. Lung sounds are rhonchus throughout. Tube feeds have been stopped. Pt HOB elevated to >45 degrees. CXR completed. RT attempted to titrate FiO2 down from 50% and Shane Banks oxygen saturation dropped down to the mid-80s. FiO2 increased to 60%. Shane Banks is now opening his eyes to voice and looking around the room. He is able to cough and clear secretions without oxygen desaturation. He intermittently has shallow, tachypnea respirations accompanied by an oxygen desaturation to 88%, but he is able to recover without additional intervention. PCCM consulted for further evaluation.  Plan of care: -Frequent suctioning -Oral care per protocol -Aspiration precautions -Pulmonary hygiene -Tube feeds stopped per provider  Call rapid response for additional needs.   [name]

## 2020-04-02 NOTE — Progress Notes (Addendum)
Have been closely monitoring patient's progress and status since aspiration event from earlier this morning.  Patient has had progressive worsening of respiratory status as evidenced by increased work of breathing, shallow respiratory effort, increasing secretions and increased O2 requirement now on FiO2 of 60%.  Most recent vital signs demonstrate tachypnea and tachycardia with pulse oximetry of 93%.  I presented to the bedside and examined the patient who was minimally responsive, had shallow respiratory effort, very coarse congested expiratory rhonchi worse on the left.  ABG has been obtained at my request.  Of note and abdominal film had been obtained earlier to rule out any obstructive process that could have contributed to aspiration event.  Abdominal film unremarkable.  I have also obtained the number for the patient's brother who is aware patient has been declining over the past 2 hours.  Brother lives outside of [place] and is planning on coming after work.  Nurse has also updated patient's sister who is aware of patient's declining status and continues to request aggressive care and full support stating that the patient is a full code.  Once ABG has resulted plan is to update PCCM in the event needs to be moved to the ICU for full ventilatory support.  Once ABG returned and necessary consultation calls made I will update patient's brother.  ABG on FiO2 of 60% with a pH of 7.318, PCO2 56, PO2 76, base excess 2.4 and bicarbonate 28.1.  This is a combination of acute hypercarbic and hypoxemic respiratory failure.  I have notified on-call PCCM staff who will come by to reevaluate the patient.  Suspected patient may need to transfer to the ICU to be placed back on mechanical ventilation.  I also attempted to contact the patient's brother Shane Banks at [phone]; was directed to voicemail.  HIPAA appropriate voicemail left planning patient status and possible need to transfer to ICU for mechanical  ventilation.  Message included information that patient is extremely weak and we are running out of options to help patient improve and that consideration may need to be given to focusing on comfort but this needs to be a decision that he and his sister come to together.  He is aware that mechanical ventilation if initiated can be withdrawn at any time per family request.  ##Brother called me back within 10 minutes of initial phone call- updated on hospital course and progressive decline in status including acute events of this am  Start time : 1507 End time: 1603

## 2020-04-02 NOTE — Progress Notes (Signed)
RT NOTES: ABG obtained and sent to lab. Lab tech Brian notified.  

## 2020-04-02 NOTE — Progress Notes (Addendum)
Noted patient to be desating and copious secretion coming out of his trach,  02 was noted to be in the upper 80s. Suctioned patient and his 02 sat  continues to desat into the 70s. This RN bagged  the patient and still not getting his 02  sat up. Called RRT and rapid response RN. Patient 02 now fluctuating from 88% to 98% after RRT adjusted his oxygen settings as noted in epic. Patient is lethargic and he's normally alert and able to track you with his eyes. Paged Dr. Myna Hidalgo and awaiting for new orders. Will continue to monitor.

## 2020-04-02 NOTE — Progress Notes (Signed)
Patient has been tachypnea throughout the morning and intermittent shallow breathing. Patient's breathing improves with frequent suctioning and pulmonary hygiene. FiO2 increased to 60%. SpO2 93-96%.  Will continue monitoring patient closely.

## 2020-04-02 NOTE — Progress Notes (Signed)
PT Cancellation Note  Patient Details Name: Shane Banks MRN: [id number] DOB: 10-Apr-1946   Cancelled Treatment:    Reason Eval/Treat Not Completed: Medical issues which prohibited therapy - Pt with respiratory rapid response this am, per chart review pt now lethargic. PT to check back this pm.  Marisa Cyphers, PT Acute Rehabilitation Services Pager [phone]  Office [phone]    [place] 04/02/2020, 8:24 AM

## 2020-04-02 NOTE — Progress Notes (Addendum)
[facility] PROGRESS NOTE  Shane Banks OVZ:[id number] DOB: [birth year] DOA: 02/14/2020 PCP: [name], MD  Status: Inpatient--Remains inpatient appropriate because :Unsafe d/c plan, IV treatments appropriate due to intensity of illness or inability to take PO and Inpatient level of care appropriate due to severity of illness-patient continues to have tenuous respiratory status   Dispo:  Patient From:  Home  Planned Disposition: [facility]  Expected discharge date: 04/01/20  Medically stable for discharge: No-needs to transition to cuffless trach, needs to be able to sit upright and recliner to pursue dialysis in the outpatient setting, progressive leukocytosis with infection work-up underway-remains on IV antibiotics secondary to sacral osteomyelitis and decubitus and recent bacteremia-   HPI: 74 y.o. male past medical history significant of chronic respiratory failure status post tracheostomy, end-stage renal disease on dialysis, chronic atrial fibrillation nonhemorrhagic stroke. Patient treated for Covid pneumonia January 2021 and had been on the vent because of progressive ARDS.  Because of lack of progression he underwent tracheostomy and [facility].  Was sent back to this facility in May due to trach site bleeding suspected hemoptysis.  He was also noted with sacral decubitus at time of presentation in May.  Since admission he has been treated for multidrug-resistant Pseudomonas pneumonia and MRSA and enterococcal bacteremia.  On 03/15/2020 patient returned to the ICU for ventilatory and pressor support he developed septic shock and developed acute renal failure requiring dialysis.  He developed sepsis physiology likely secondary to sacral decubitus ulcer with underlying osteomyelitis.    He has been weaned from pressors. PCCM is following weekly for trach weaning with the assistance of SLP.  Patient was liberated from the vent on 03/21/2020  and was transferred to the floor in 03/22/2020.  Due to the lack of insurance approval he cannot go [facility] to [facility].  Social worker is working on Clinical research associate.  He will need to be transition to a cuffless trach and be able to sit upright in a recliner for hemodialysis before he can discharge to skilled nursing facility.  On the morning of 7/6 patient developed hypoxemia and increased work of breathing with shallow respiratory effort, requiring frequent suctioning.  O2 sats decreased into the 70s but improved to 95% with bagging and suctioning.  Briefly required increase of FiO2 to 100% but was eventually decreased back to 40%.  Tracheal aspirate appeared consistent with tube feedings therefore tube feedings were stopped.  Subjective: Somewhat lethargic initially.  Patient eventually did open eyes and nod. Remains very weak and except for nodding unable to follow simple commands  Objective: Vitals:   04/02/20 0805 04/02/20 1026  BP:  125/74  Pulse: (!) 112 (!) 109  Resp: (!) 22 (!) 25  Temp:    SpO2: 93% 95%    Intake/Output Summary (Last 24 hours) at 04/02/2020 1031 Last data filed at 04/01/2020 1800 Gross per 24 hour  Intake 554.17 ml  Output 1300 ml  Net -745.83 ml   Filed Weights   03/31/20 0500 04/01/20 0513 04/02/20 0323  Weight: 60.3 kg 60.3 kg 60.3 kg    Exam:  Constitutional: NAD, calm but lethargic, comfortable Neck: normal, supple, no masses, no thyromegaly-midline cuffed trach in place  Respiratory: Coarse lung sounds throughout with more expiratory rhonchi and crackles on the left through entire lung field, some crackles on the right. Tachypneic with some use of accessory muscles which is baseline for this patient.  Trach collar oxygen/FiO2 40%-suction earlier with aspirate having  some color and consistency of tube feeding. Cardiovascular: Regular rate and rhythm, no murmurs / rubs / gallops. No extremity edema. 2+ pedal pulses. No carotid bruits.   Dialysis catheter right subclavian vein. Abdomen: no tenderness, no masses palpated. No hepatosplenomegaly. Bowel sounds positive.  Colostomy in place with light brown loose stool.  Gastrostomy tube in place with tube feedings now on hold. GU: Condom catheter in place draining yellow-colored urine to bedside bag Musculoskeletal: no clubbing / cyanosis. No joint deformity upper and lower extremities.  Not spontaneously moving but with PROM has adequate range of motion., no contractures.  Diminished muscle tone noting too weak to follow commands with upper extremities.  And unable to lift any of his extremities upon command secondary to generalized weakness. Skin: no rashes, lesions, sacral decubitus not examined today Neurologic: CN 2-12 grossly intact. Sensation intact, DTR not assessed-to assess strength adequately given patient's inability to participate but he was unable to lift either arm off the bed for assessment and also was unable to comply with request to hold up the finger on either hand. 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  PCCM following periodically to assess readiness for transition cuffless trach. Last sputum culture on 6/19 with multidrug-resistant Pseudomonas and follow-up chest x-ray revealed diffuse left-sided airspace disease with patchy right airspace disease Repeat sputum culture obtained by RT on 7/5 On 7/6 respiratory decompensation as noted above.  Aspiration at this juncture appears to be definite contributory factor.  PCCM has reevaluated patient and since patient is full code if he continues to worsen he will need mechanical ventilatory support.  Of note, family  refusing intervention further conversations with palliative medicine team. Continue IV meropenem-WBC once again trending upward- now 18,000 axillary T-max in the past 24 hours 100 F Unfortunately given recurrent pneumonia and increased secretions with associated generalized weakness/weak cough it is highly doubtful he can transition to cuffless trach at this point.  Overall prognosis is poor.  Continue chest physiotherapy with vibra vest administered per RT Note, nursing staff documented thick, blood-tinged secretions from trach on the afternoon of 7/4 -no similar secretions today **Pt with recurrent episode of tachypnea and increased WOB that improved after FIO2 increased to 60% by RT around 12 pm 1517: RR and HR up- pulse ox 93%- WBC today up to 20.2 after early aspiration event- will obtain stat ABG- may need to tx to ICU for mechanical ventilation  Leukocytosis/mild funguria As above regarding possible pulmonary etiology to current leukocytosis trend Urinalysis abnormal concerning for possible UTI-urine culture positive for 30,000 colonies of yeast -likely colonization  Diarrhea Likely noninfectious in etiology-C. difficile negative Suspect related to antibiotics and other medications bolus tube feeding Appears to be slowing-fluids discontinued over the weekend since is dialysis patient  Sacral osteomyelitis and HD catheter sepsis/  Enterococcal bacteremia: Tunneled catheter removal on 02/21/2020. Has tunneled HD catheter right subclavian-side unremarkable Will require 6 weeks of IV antibiotics per ID (vancomycin and Flagyl.) Currently has PIV in place for antibiotics  Paroxysmal atrial fibrillation/Acquired thrombophilia: Currently rate controlled on metoprolol Anticoagulation was discontinued due to suspected GI bleed.  End-stage renal disease: On HD since March 2021 status post tunneled catheter placement 02/28/2020 and as of 7/1 site unremarkable. Dialysis days: MWF SNF reports  patient will need to  be able to sit upright in recliner to receive hemodialysis before they will accept him as a patient Unfortunately, this patient continues to decline, he has significant weakness and is unable to tolerate sitting up in a recliner receive outpatient hemodialysis.  If he makes no significant improvements over the next 5 to 7 days it is likely the nephrologist will need to contact with the family to update on status and to inform them that he is no longer a candidate for outpatient hemodialysis  Chronic normocytic anemia: Due to anemia of chronic renal disease Status post1 unit of PRBCs Continue erythropoietin and IV iron per renal.  History of nonhemorrhagic CVA: CT of the head showed atrophy and chronic microvascular disease. Not on anticoagulation due to possible GI bleed.  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 Documented as full-thickness tissue loss  Severe protein caloric malnutrition: Tube feedings on hold due to concerns of a possible aspiration as above.  Patient is dialysis patient but will allow D5 normal saline at 30 cc/h to replace tube feeding volume loss Will allow medications per tube with small amounts of flush Nutrition Status: Nutrition Problem: Severe Malnutrition Etiology: chronic illness (chronic respiratory failure related to COVID ARDS) Signs/Symptoms: severe muscle depletion, severe fat depletion Interventions: Prostat, Tube feeding, Juven      Data Reviewed: Basic Metabolic Panel: Recent Labs  Lab 03/27/20 0245 03/27/20 2117 03/28/20 0233 03/28/20 0233 03/29/20 0446 03/30/20 0347 03/31/20 0625 04/01/20 0435 04/02/20 0807  NA   < >  --  136   < > 137 138 137 136 133*  K   < > 3.5 3.3*   < > 3.4* 4.1 4.6 4.0 3.9  CL   < >  --  99   < > 99 102 103 103 97*  CO2   < >  --  27   < > 28 25 21* 22 26  GLUCOSE   < >  --  123*   < > 132* 133* 115* 129* 130*  BUN   < >  --  66*   < >  101* 43* 79* 118* 74*  CREATININE   < >  --  2.20*   < > 3.11* 1.91* 2.74* 3.24* 2.45*  CALCIUM   < >  --  9.1   < > 9.5 9.1 9.1 9.3 9.1  MG  --  1.7  --   --   --   --   --   --   --   PHOS   < >  --  2.7  --  3.5 1.9* 3.4 4.0  --    < > = values in this interval not displayed.   Liver Function Tests: Recent Labs  Lab 03/29/20 0446 03/30/20 0347 03/31/20 0625 04/01/20 0435 04/02/20 0807  AST  --   --   --   --  33  ALT  --   --   --   --  23  ALKPHOS  --   --   --   --  110  BILITOT  --   --   --   --  0.7  PROT  --   --   --   --  7.8  ALBUMIN 1.8* 2.2* 1.9* 1.9* 2.0*   No results for input(s): LIPASE, AMYLASE in the last 168 hours. No results for input(s): AMMONIA in the last 168 hours. CBC: Recent Labs  Lab 03/27/20 0245 03/27/20 0245 03/28/20 7989  03/28/20 0233 03/29/20 0446 03/30/20 0347 03/31/20 1026 04/01/20 0435 04/02/20 0807  WBC 16.7*   < > 18.4*   < > 16.0* 18.0* 17.2* 18.0* 20.2*  NEUTROABS 10.9*  --  12.5*  --  11.4* 12.9* 12.0*  --   --   HGB 9.6*   < > 9.6*   < > 9.8* 9.8* 9.7* 9.5* 9.8*  HCT 30.9*   < > 31.3*   < > 32.0* 32.2* 31.1* 30.9* 31.2*  MCV 81.7   < > 82.2   < > 82.7 82.6 81.2 82.6 80.8  PLT 273   < > 228   < > 257 225 267 222 248   < > = values in this interval not displayed.   Cardiac Enzymes: No results for input(s): CKTOTAL, CKMB, CKMBINDEX, TROPONINI in the last 168 hours. BNP (last 3 results) No results for input(s): BNP in the last 8760 hours.  ProBNP (last 3 results) No results for input(s): PROBNP in the last 8760 hours.  CBG: Recent Labs  Lab 04/01/20 1558 04/01/20 2034 04/01/20 2330 04/02/20 0431 04/02/20 0806  GLUCAP 161* 156* 112* 123* 128*    Recent Results (from the past 240 hour(s))  MRSA PCR Screening     Status: None   Collection Time: 03/27/20  7:59 PM   Specimen: Nasal Mucosa; Nasopharyngeal  Result Value Ref Range Status   MRSA by PCR NEGATIVE NEGATIVE Final    Comment:        The GeneXpert MRSA Assay  (FDA approved for NASAL specimens only), is one component of a comprehensive MRSA colonization surveillance program. It is not intended to diagnose MRSA infection nor to guide or monitor treatment for MRSA infections. Performed at [facility], [place] [address], [place], [place] [address]   Culture, Urine     Status: Abnormal   Collection Time: 03/28/20  2:10 PM   Specimen: Urine, Random  Result Value Ref Range Status   Specimen Description URINE, RANDOM  Final   Special Requests   Final    NONE Performed at [facility], [place] [address] [place], [place] [address]    Culture 30,000 COLONIES/mL YEAST (A)  Final   Report Status 03/29/2020 FINAL  Final  C Difficile Quick Screen (NO PCR Reflex)     Status: None   Collection Time: 03/29/20 10:20 AM   Specimen: STOOL  Result Value Ref Range Status   C Diff antigen NEGATIVE NEGATIVE Final   C Diff toxin NEGATIVE NEGATIVE Final   C Diff interpretation No C. difficile detected.  Final    Comment: Performed at [facility], [place] [address], [place], [place] [address]  Culture, respiratory (non-expectorated)     Status: None (Preliminary result)   Collection Time: 04/01/20  9:24 AM   Specimen: Tracheal Aspirate; Respiratory  Result Value Ref Range Status   Specimen Description TRACHEAL ASPIRATE  Final   Special Requests NONE  Final   Gram Stain   Final    RARE WBC PRESENT, PREDOMINANTLY MONONUCLEAR FEW SQUAMOUS EPITHELIAL CELLS PRESENT RARE GRAM NEGATIVE RODS    Culture   Final    FEW GRAM NEGATIVE RODS SUSCEPTIBILITIES TO FOLLOW Performed at [facility], [place] [address] [place], [place] [address]    Report Status PENDING  Incomplete     Studies: No results found.  Scheduled Meds: . apixaban  2.5 mg Per Tube BID  . chlorhexidine  15 mL Mouth Rinse BID  . chlorhexidine  gluconate (MEDLINE KIT)  15 mL Mouth Rinse BID  . Chlorhexidine Gluconate Cloth  6 each Topical Q0600  . darbepoetin (ARANESP)  injection - DIALYSIS  100 mcg Intravenous Q Mon-HD  . feeding supplement (PRO-STAT SUGAR FREE 64)  30 mL Per Tube BID  . insulin aspart  0-6 Units Subcutaneous Q4H  . insulin aspart  2 Units Subcutaneous Q4H  . levothyroxine  25 mcg Per Tube Q0600  . mouth rinse  15 mL Mouth Rinse q12n4p  . metoprolol tartrate  25 mg Per Tube BID  . nutrition supplement (JUVEN)  1 packet Per Tube BID BM  . pantoprazole sodium  40 mg Per Tube Daily  . phenylephrine 200 mcg / ml (NON-ED USE ONLY) injection  100 mcg Intracavernosal Once  . vancomycin  500 mg Intravenous Q M,W,F-HD   Continuous Infusions: . dextrose 5 % and 0.9% NaCl 30 mL/hr at 04/02/20 0922  . feeding supplement (NEPRO CARB STEADY) 1,000 mL (04/02/20 0537)  . meropenem (MERREM) IV 200 mL/hr at 04/01/20 2056    Principal Problem:   Enterococcal bacteremia Active Problems:   Acute respiratory failure (HCC)   Hemoptysis   Pressure injury of skin   Protein-calorie malnutrition, severe   Decubitus ulcer of sacral region, stage 4 ([place])   Fever   Palliative care by specialist   Goals of care, counseling/discussion   DNR (do not resuscitate)     Code Status: Full Family Communication: [name] sister 03/29/20    Consultants: PCCM  Procedures:  Echocardiogram  Antibiotics: Anti-infectives (From admission, onward)   Start     Dose/Rate Route Frequency Ordered Stop   04/01/20 1013  vancomycin (VANCOCIN) 500-5 MG/100ML-% IVPB       Note to Pharmacy: Cherylann Banas   : cabinet override      04/01/20 1013 04/01/20 1253   03/29/20 1552  vancomycin (VANCOCIN) 500-5 MG/100ML-% IVPB       Note to Pharmacy: Anthonette Legato, Melisa   : cabinet override      03/29/20 1552 03/29/20 1556   03/29/20 1415  fluconazole (DIFLUCAN) 40 MG/ML suspension 100 mg        100 mg Oral Daily 03/29/20 1408 03/31/20 1015   03/28/20 2000  meropenem (MERREM) 500 mg in sodium chloride 0.9 % 100 mL IVPB     Discontinue     500 mg 200 mL/hr over 30 Minutes  Intravenous Every 24 hours 03/28/20 1623 04/04/20 2359   03/25/20 1800  cefTAZidime (FORTAZ) 1 g in sodium chloride 0.9 % 100 mL IVPB  Status:  Discontinued       "Followed by" Linked Group Details   1 g 200 mL/hr over 30 Minutes Intravenous Every 24 hours 03/18/20 1015 03/28/20 1613   03/22/20 0830  vancomycin (VANCOREADY) IVPB 500 mg/100 mL        500 mg 100 mL/hr over 60 Minutes Intravenous  Once 03/22/20 0827 03/22/20 1014   03/20/20 1200  vancomycin (VANCOCIN) IVPB 500 mg/100 ml premix     Discontinue     500 mg 100 mL/hr over 60 Minutes Intravenous Every M-W-F (Hemodialysis) 03/19/20 0941 04/04/20 2359   03/18/20 1800  meropenem (MERREM) 500 mg in sodium chloride 0.9 % 100 mL IVPB       "Followed by" Linked Group Details   500 mg 200 mL/hr over 30 Minutes Intravenous Every 24 hours 03/18/20 1015 03/25/20 0026   03/18/20 1200  vancomycin (VANCOCIN) IVPB 500 mg/100 ml premix  500 mg 100 mL/hr over 60 Minutes Intravenous Every M-W-F (Hemodialysis) 03/18/20 0938 03/18/20 1700   03/18/20 1200  cefTAZidime (FORTAZ) 2 g in sodium chloride 0.9 % 100 mL IVPB  Status:  Discontinued        2 g 200 mL/hr over 30 Minutes Intravenous Every Mon (Hemodialysis) 03/18/20 0939 03/18/20 1015   03/07/20 1800  cefTAZidime (FORTAZ) 2 g in sodium chloride 0.9 % 100 mL IVPB  Status:  Discontinued        2 g 200 mL/hr over 30 Minutes Intravenous Every T-Th-Sa (1800) 03/06/20 1255 03/18/20 1015   03/07/20 1200  vancomycin (VANCOCIN) IVPB 500 mg/100 ml premix  Status:  Discontinued        500 mg 100 mL/hr over 60 Minutes Intravenous Every T-Th-Sa (Hemodialysis) 03/06/20 1255 03/19/20 0941   03/04/20 2000  cefTAZidime (FORTAZ) 2 g in sodium chloride 0.9 % 100 mL IVPB  Status:  Discontinued        2 g 200 mL/hr over 30 Minutes Intravenous Every M-W-F (2000) 03/01/20 1136 03/06/20 1255   03/02/20 1200  vancomycin (VANCOCIN) IVPB 500 mg/100 ml premix        500 mg 100 mL/hr over 60 Minutes Intravenous  Every T-Th-Sa (Hemodialysis) 03/01/20 1131 03/02/20 1710   03/01/20 1230  vancomycin (VANCOCIN) IVPB 500 mg/100 ml premix  Status:  Discontinued        500 mg 100 mL/hr over 60 Minutes Intravenous Every M-W-F (Hemodialysis) 03/01/20 1131 03/06/20 1255   02/28/20 1110  vancomycin (VANCOREADY) IVPB 500 mg/100 mL        over 60 Minutes  Continuous PRN 02/28/20 1112 02/28/20 1110   02/28/20 1101  vancomycin (VANCOCIN) 1-5 GM/200ML-% IVPB  Status:  Discontinued       Note to Pharmacy: Lytle Butte   : cabinet override      02/28/20 1101 02/28/20 1327   02/27/20 1800  cefTAZidime (FORTAZ) 1 g in sodium chloride 0.9 % 100 mL IVPB        1 g 200 mL/hr over 30 Minutes Intravenous Every 24 hours 02/27/20 0947 03/03/20 1738   02/27/20 1200  vancomycin (VANCOCIN) IVPB 500 mg/100 ml premix        500 mg 100 mL/hr over 60 Minutes Intravenous Once 02/27/20 0829 02/27/20 1343   02/24/20 1800  vancomycin (VANCOREADY) IVPB 500 mg/100 mL        500 mg 100 mL/hr over 60 Minutes Intravenous  Once 02/24/20 1308 02/24/20 1905   02/22/20 2200  metroNIDAZOLE (FLAGYL) tablet 500 mg  Status:  Discontinued        500 mg Per Tube Every 8 hours 02/22/20 0957 03/30/20 1057   02/22/20 1700  cefTRIAXone (ROCEPHIN) 2 g in sodium chloride 0.9 % 100 mL IVPB  Status:  Discontinued        2 g 200 mL/hr over 30 Minutes Intravenous Every 24 hours 02/22/20 0957 02/27/20 0947   02/22/20 1200  vancomycin (VANCOCIN) IVPB 500 mg/100 ml premix  Status:  Discontinued        500 mg 100 mL/hr over 60 Minutes Intravenous Every T-Th-Sa (Hemodialysis) 02/20/20 1253 02/21/20 1040   02/21/20 1431  vancomycin variable dose per unstable renal function (pharmacist dosing)  Status:  Discontinued         Does not apply See admin instructions 02/21/20 1431 03/01/20 1131   02/21/20 1200  vancomycin (VANCOREADY) IVPB 500 mg/100 mL        500 mg 100 mL/hr over 60 Minutes Intravenous  Once 02/21/20 0956 02/21/20 1253   02/21/20 0714  vancomycin  (VANCOCIN) 500-5 MG/100ML-% IVPB  Status:  Discontinued       Note to Pharmacy: Ashley Akin   : cabinet override      02/21/20 0714 02/21/20 1346   02/20/20 1400  vancomycin (VANCOREADY) IVPB 1250 mg/250 mL        1,250 mg 166.7 mL/hr over 90 Minutes Intravenous  Once 02/20/20 1253 02/20/20 1538   02/20/20 1200  levofloxacin (LEVAQUIN) IVPB 500 mg  Status:  Discontinued        500 mg 100 mL/hr over 60 Minutes Intravenous Every 48 hours 02/20/20 1020 02/21/20 0943   02/15/20 1545  vancomycin (VANCOCIN) IVPB 1000 mg/200 mL premix  Status:  Discontinued       "Followed by" Linked Group Details   1,000 mg 200 mL/hr over 60 Minutes Intravenous Every 24 hours 02/14/20 1530 02/14/20 1745   02/15/20 0330  ceFEPIme (MAXIPIME) 2 g in sodium chloride 0.9 % 100 mL IVPB  Status:  Discontinued        2 g 200 mL/hr over 30 Minutes Intravenous Every 12 hours 02/14/20 1531 02/14/20 1531   02/14/20 1531  ceFEPIme (MAXIPIME) 2 g in sodium chloride 0.9 % 100 mL IVPB  Status:  Discontinued        2 g 200 mL/hr over 30 Minutes Intravenous Every 12 hours 02/14/20 1531 02/14/20 1745   02/14/20 1530  ceFEPIme (MAXIPIME) 2 g in sodium chloride 0.9 % 100 mL IVPB  Status:  Discontinued        2 g 200 mL/hr over 30 Minutes Intravenous  Once 02/14/20 1520 02/14/20 1531   02/14/20 1530  vancomycin (VANCOREADY) IVPB 1500 mg/300 mL  Status:  Discontinued       "Followed by" Linked Group Details   1,500 mg 150 mL/hr over 120 Minutes Intravenous  Once 02/14/20 1530 02/14/20 1745        Time spent: 35 minutes    Erin Hearing ANP [facility] Pager [phone]. If 7PM-7AM, please contact night-coverage at [email or web address] 04/02/2020, 10:31 AM  LOS: 48 days

## 2020-04-02 NOTE — Progress Notes (Signed)
Renal Navigator has reviewed records and observed patient in HD unit. Navigator also has spoken with members of the medical team. Not only will patient need to tolerate HD in a recliner, which may not be feasible given his sacral osteo/decub, his trach will need to be capped and not require any suctioning before he will be a candidate for acceptance to an outpatient dialysis clinic. Navigator understands that patient's family has requested that Palliative Care not contact them again, but is concerned about patient's current status and feels it may be time for medical team to reach out to family to suggest Palliative Care involvement again, especially with patient's recent decline again overnight, to see if family is agreeable to Palliative Care re-involvement. It also may be helpful to consult Ethics to discuss case with family.   Alphonzo Cruise, [place] Renal Navigator [phone]

## 2020-04-02 NOTE — Significant Event (Addendum)
Rapid Response Event Note  Overview: Called d/t pt desating-70s on .40 TC. Per RN, pt was found with copious secretions coming out of trach. RN called RT and attempted to suction pt. RT arrived and began to bag and suction pt. SpO2 increased to 95% with bagging. During this time, RRT was called. Pt was placed back on .40 TC once RRT arrived. SpO2 continued to decreased to 87-89% range on .40 TC. Pt was then turned up to .43 TC and MD was called.   Initial Focused Assessment: Pt laying in bed, lethargic. Pt will open eyes to pain, however, will not track or follow commands(per RN, pt is normally able to track and nod appropriately). Pt does not move extremities(baseline). Pt is taking very slow, shallow breaths. Lungs rhonchus and diminished t/o. HR-76(a-flutter), BP-141/59, RR-10, SpO2-87% on .40 TC.  FiO2 increased to .98% TC with SpO2 increasing to 99%.   Interventions: Pt bagged and suctioned FiO2 increased to .98% Dr. Olevia Bowens to bedside.  PCCM consult Plan of Care (if not transferred): PCCM coming to see pt. Continue to monitor pt. Care handed off to [place], day shift RR RN @0714  Event Summary:  Dr. Myna Hidalgo notified of situation by bedside RN   Called: [phone] Arrived: 0640   Dillard Essex

## 2020-04-03 LAB — RENAL FUNCTION PANEL
Albumin: 1.8 g/dL — ABNORMAL LOW (ref 3.5–5.0)
Albumin: 2.5 g/dL — ABNORMAL LOW (ref 3.5–5.0)
Anion gap: 10 (ref 5–15)
Anion gap: 9 (ref 5–15)
BUN: 89 mg/dL — ABNORMAL HIGH (ref 8–23)
BUN: 34 mg/dL — ABNORMAL HIGH (ref 8–23)
CO2: 25 mmol/L (ref 22–32)
CO2: 26 mmol/L (ref 22–32)
Calcium: 9.1 mg/dL (ref 8.9–10.3)
Calcium: 8.2 mg/dL — ABNORMAL LOW (ref 8.9–10.3)
Chloride: 100 mmol/L (ref 98–111)
Chloride: 99 mmol/L (ref 98–111)
Creatinine, Ser: 3.06 mg/dL — ABNORMAL HIGH (ref 0.61–1.24)
Creatinine, Ser: 1.4 mg/dL — ABNORMAL HIGH (ref 0.61–1.24)
GFR calc Af Amer: 22 mL/min — ABNORMAL LOW (ref >60)
GFR calc Af Amer: 58 mL/min — ABNORMAL LOW (ref >60)
GFR calc non Af Amer: 19 mL/min — ABNORMAL LOW (ref >60)
GFR calc non Af Amer: 50 mL/min — ABNORMAL LOW (ref >60)
Glucose, Bld: 115 mg/dL — ABNORMAL HIGH (ref 70–99)
Glucose, Bld: 114 mg/dL — ABNORMAL HIGH (ref 70–99)
Phosphorus: 5.9 mg/dL — ABNORMAL HIGH (ref 2.5–4.6)
Phosphorus: 2.8 mg/dL (ref 2.5–4.6)
Potassium: 4.2 mmol/L (ref 3.5–5.1)
Potassium: 3 mmol/L — ABNORMAL LOW (ref 3.5–5.1)
Sodium: 135 mmol/L (ref 135–145)
Sodium: 134 mmol/L — ABNORMAL LOW (ref 135–145)

## 2020-04-03 LAB — CBC
HCT: 30.5 % — ABNORMAL LOW (ref 39.0–52.0)
Hemoglobin: 9.4 g/dL — ABNORMAL LOW (ref 13.0–17.0)
MCH: 25.1 pg — ABNORMAL LOW (ref 26.0–34.0)
MCHC: 30.8 g/dL (ref 30.0–36.0)
MCV: 81.6 fL (ref 80.0–100.0)
Platelets: 227 10*3/uL (ref 150–400)
RBC: 3.74 MIL/uL — ABNORMAL LOW (ref 4.22–5.81)
RDW: 18.2 % — ABNORMAL HIGH (ref 11.5–15.5)
WBC: 14.9 10*3/uL — ABNORMAL HIGH (ref 4.0–10.5)
nRBC: 0 % (ref 0.0–0.2)

## 2020-04-03 LAB — GLUCOSE, CAPILLARY
Glucose-Capillary: 109 mg/dL — ABNORMAL HIGH (ref 70–99)
Glucose-Capillary: 120 mg/dL — ABNORMAL HIGH (ref 70–99)
Glucose-Capillary: 109 mg/dL — ABNORMAL HIGH (ref 70–99)
Glucose-Capillary: 111 mg/dL — ABNORMAL HIGH (ref 70–99)

## 2020-04-03 LAB — MAGNESIUM: Magnesium: 1.5 mg/dL — ABNORMAL LOW (ref 1.7–2.4)

## 2020-04-03 MED ORDER — HEPARIN SODIUM (PORCINE) 1000 UNIT/ML DIALYSIS: 20.0000 [IU]/kg | INTRAMUSCULAR | Status: DC | PRN

## 2020-04-03 MED ORDER — LIDOCAINE HCL (PF) 1 % IJ SOLN: 5.0000 mL | INTRAMUSCULAR | Status: DC | PRN

## 2020-04-03 MED ORDER — VANCOMYCIN HCL IN DEXTROSE 500-5 MG/100ML-% IV SOLN
INTRAVENOUS | Status: AC
  Administered 2020-04-03: 500 mg via INTRAVENOUS
  Filled 2020-04-03: qty 100

## 2020-04-03 MED ORDER — PANCRELIPASE (LIP-PROT-AMYL) 10440-39150 UNITS PO TABS
20880.0000 [IU] | ORAL_TABLET | Freq: Once | ORAL | Status: DC
  Filled 2020-04-03: qty 2

## 2020-04-03 MED ORDER — POTASSIUM CHLORIDE 20 MEQ PO PACK
40.0000 meq | PACK | ORAL | Status: AC
  Administered 2020-04-03 – 2020-04-04 (×2): 40 meq via ORAL
  Filled 2020-04-03 (×2): qty 2

## 2020-04-03 MED ORDER — HEPARIN SODIUM (PORCINE) 1000 UNIT/ML DIALYSIS: 1000.0000 [IU] | INTRAMUSCULAR | Status: DC | PRN

## 2020-04-03 MED ORDER — ALBUMIN HUMAN 25 % IV SOLN: 25.0000 g | Freq: Once | INTRAVENOUS | Status: AC

## 2020-04-03 MED ORDER — PENTAFLUOROPROP-TETRAFLUOROETH EX AERO: 1.0000 "application " | INHALATION_SPRAY | CUTANEOUS | Status: DC | PRN

## 2020-04-03 MED ORDER — ALBUMIN HUMAN 25 % IV SOLN
25.0000 g | Freq: Once | INTRAVENOUS | Status: AC
  Administered 2020-04-03: 25 g via INTRAVENOUS

## 2020-04-03 MED ORDER — ALBUMIN HUMAN 25 % IV SOLN
INTRAVENOUS | Status: AC
  Filled 2020-04-03: qty 100

## 2020-04-03 MED ORDER — MIDODRINE HCL 5 MG PO TABS
10.0000 mg | ORAL_TABLET | ORAL | Status: AC
  Administered 2020-04-03: 10 mg via ORAL

## 2020-04-03 MED ORDER — ALTEPLASE 2 MG IJ SOLR: 2.0000 mg | Freq: Once | INTRAMUSCULAR | Status: DC | PRN

## 2020-04-03 MED ORDER — SODIUM CHLORIDE 0.9 % IV SOLN: 100.0000 mL | INTRAVENOUS | Status: DC | PRN

## 2020-04-03 MED ORDER — SODIUM BICARBONATE 650 MG PO TABS
650.0000 mg | ORAL_TABLET | Freq: Once | ORAL | Status: DC
  Filled 2020-04-03: qty 1

## 2020-04-03 MED ORDER — ALBUMIN HUMAN 25 % IV SOLN
INTRAVENOUS | Status: AC
  Administered 2020-04-03: 25 g via INTRAVENOUS
  Filled 2020-04-03: qty 100

## 2020-04-03 MED ORDER — LIDOCAINE-PRILOCAINE 2.5-2.5 % EX CREA: 1.0000 "application " | TOPICAL_CREAM | CUTANEOUS | Status: DC | PRN

## 2020-04-03 MED ORDER — MAGNESIUM SULFATE 2 GM/50ML IV SOLN
2.0000 g | Freq: Once | INTRAVENOUS | Status: AC
  Administered 2020-04-03: 2 g via INTRAVENOUS
  Filled 2020-04-03: qty 50

## 2020-04-03 MED ORDER — HEPARIN SODIUM (PORCINE) 1000 UNIT/ML IJ SOLN
INTRAMUSCULAR | Status: AC
  Filled 2020-04-03: qty 1

## 2020-04-03 MED ORDER — MIDODRINE HCL 5 MG PO TABS
ORAL_TABLET | ORAL | Status: AC
  Filled 2020-04-03: qty 2

## 2020-04-03 NOTE — Progress Notes (Signed)
NAME:  Shane Banks, MRN:  [id number], DOB:  02-13-1946, LOS: 57 ADMISSION DATE:  02/14/2020, CONSULTATION DATE:  5/19 REFERRING MD:  Dr. Alvino Chapel, CHIEF COMPLAINT:  Hemoptysis   Brief History   74 yo m with PMHx of afib with RVR, BPH, stroke, and COVID ARDS in January requiring trach and LTACH placement at [facility]. Had been tolerating trach collar, but on 5/19 developed hemoptysis and hypoxemic respiratory failure requiring vent. Transferred to [facility] on 5/19.  Past Medical History   has a past medical history of Acute on chronic respiratory failure with hypoxia ([place]), Atrial fibrillation with RVR ([place]), BPH (benign prostatic hyperplasia), COVID-19 virus infection, Pneumonia due to COVID-19 virus, Severe sepsis ([place]), and Stroke ([place]).  Significant Hospital Events   1/8  Admit to [facility] for COVID PNA 2/23 Transfer to [facility] on vent 5/19 Transfer to [facility] for hemoptysis. #6 cuffed trach placed. Back on vent. Bronch performed 5/20 Transitioned to trach collar.  5/22 back on vent full time 5/24 back on antibiotics for fevers 6/1 Initiating PSV weans 6/3 Large cuff leak, trach exchange 6/7 transitioned to [facility] 6/9 remains on TC continuously since 6/7 6/23-tolerating trach collar today 6/25-left of the ventilator last evening and tolerated well 04/02/2020 new episode of hypoxia copious secretions tachypnea questionable need to return to ICU pulmonary critical care reconsulted  Consults:  ENT Nephrology ID PCCM  Procedures:  ENT flex scope 5/19 > no bleeding source identified FOB 5/19 > no pulmonary bleeding source identified.   Significant Diagnostic Tests:  CXR 5/28 >> R CVC placement confirmed Slightly improved aeration at the right base. Left basilar consolidation and probable effusion, in addition to the remaining pulmonary infiltrates are otherwise unchanged  5/28 MR Sacrum SI Joints WO Contrast Osteomyelitis of the fifth sacral  segment. Edema in the muscles around the hips and in the posterior paraspinal musculature of the lower lumbar spine and in the buttocks which could represent myositis. Fluid-fluid level in the bladder may represent protein or debris in the bladder. The possibility of urinary tract infection should be considered.  CXR 6/3> improved R lung aeration, worsening L sided opacity. Tracheostomy tube, HD catheter.   CXR 6/4>wosened R lung infiltrates and stable L sided infiltrate. Possible bilateral pleural effusions  Chest x-ray 6/24-basal infiltrates, stable from prior  Micro Data:    tracheal aspirate 5/7> for Pseudomonas A  BAL 5/19 > no growth 5/19 blood > NG 5/22 trach aspirate>> candida [place] 5/22 blood>> 1/4 GPC> staph epi 5/25 blood>> GPC (enterococcus on biofire) 2/4 bottles>> E. Faecalis, staph epi. 5/25 trach aspirate>>many PMN, few GPR, rare GPC> diphtheroids 5/25 blood cx>> + for Enterococcus Faecalis (gent resistant)/ Staphylococcus Epidermidis (tetracycline, vanc, rifampin sensitive) 5/27 BCx> ngtd  04/01/2020 sputum culture Gram neg rods>>PA SS pending  Antimicrobials:  Levofloxacin 5/24>5/26 vanc 5/25>off 7/5>> Ceftriaxone  5/27>6/1 Metronidazole 5/27> off Fortaz 6/1>off Meropenem 03/28/2020>>    Interim history/subjective:  Frequent suctioning  Objective   Blood pressure 116/61, pulse 88, temperature 99 F (37.2 C), temperature source Axillary, resp. rate (!) 24, height _0  (1.753 m), weight 60.3 kg, SpO2 98 %.    FiO2 (%):  [60 %] 60 %   Intake/Output Summary (Last 24 hours) at 04/03/2020 0803 Last data filed at 04/03/2020 0428 Gross per 24 hour  Intake 744.61 ml  Output 75 ml  Net 669.61 ml   Filed Weights   03/31/20 0500 04/01/20 0513 04/02/20 0323  Weight: 60.3 kg 60.3 kg 60.3 kg  Examination:   Frail elderly very weak male with low life forces Trach, cuff down, copious thick secretions from trach that he has trouble clearing Decreased air  movement Abd soft + bs HSD Ext + EDEMA          NO cxr 7/7 Resolved Hospital Problem list     Assessment & Plan:   Acute on chronic hypoxemic respiratory failure requiring tracheostomy, prolonged MV COVID fibrosis,  Deconditioning multiple HCAPs. Pseudomonas  04/02/2020 again growing gram-negative rods in sputum Copious tracheal secretions Generalized failure to thrive Suspected continued aspiration  Pulmonary hygiene  Remains with copious secretions Resume tube feeds Abx per primary I suspect he will eventually be back on ventilator do to weaken state, FTT, inability to clear secretions. 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 Per primary    Chronic osteomyelitis. On abx  End-stage renal disease on hemodialysis. PPer renal   Pulmonary critical care is to see once weekly for trach care.  [name] ACNP Acute Care Nurse Practitioner [place] Please consult Amion 04/03/2020, 8:03 AM

## 2020-04-03 NOTE — Progress Notes (Addendum)
  Speech Language Pathology Treatment: Dysphagia  Patient Details Name: Shane Banks MRN: [id number] DOB: 11/29/1945 Today's Date: 04/03/2020 Time: 0321-2248 SLP Time Calculation (min) (ACUTE ONLY): 14 min  Assessment / Plan / Recommendation Clinical Impression  Unfortunately pt looks worse than last session (aware of rapid response on 7/6). Overall pt response only to movement of bed with grimace, opened eyes to painful stimuli and twice spontaneously for several seconds. Therapist examined oral cavity noting adhered secretions on hard palate directly behind teeth and removed majority utilizing various techniques. Administration of ice chips was prohibited today. Speaking valve placed for 3-5 second intervals mobilizing air but no attempts to phonate. RR 30-40, O2 sats 98%. Unable to utilize expiratory muscle strength trainer today. Pt is one time a week for frequency and plan to check on next week for status. Please continue diligent oral care. If attempts to speak, place valve ensuring cuff deflated but do not leave pt with valve in place.    HPI HPI: 74 yo m with PMHx of afib with RVR, BPH, stroke, and COVID ARDS in January requiring trach and LTACH placement at [facility]. Had been tolerating trach collar, but on 5/19 developed hemoptysis and hypoxemic respiratory failure requiring vent. Transferred to [facility] on 5/19. PMSV desired for discussion with pt regarding goals of care.      SLP Plan  Continue with current plan of care       Recommendations  Diet recommendations: NPO      Patient may use Passy-Muir Speech Valve: During all therapies with supervision PMSV Supervision: Full MD: Please consider changing trach tube to : Cuffless         Oral Care Recommendations: Oral care QID Follow up Recommendations: LTACH SLP Visit Diagnosis: Aphonia (R49.1) Plan: Continue with current plan of care                       [name] 04/03/2020, 9:02 AM  Orbie Pyo Colvin Caroli.Ed Risk analyst [phone] Office [phone]

## 2020-04-03 NOTE — Progress Notes (Signed)
[facility] PROGRESS NOTE  Assessment/ Plan: Pt is a 74 y.o. yo male  who initially came from [facility] to [facility] after [place] hospitalization which included mech ventilation and trach placement. At [facility] had severe infections/ bacteremia/ PNA and developed AKI felt due to IV gent toxicity requiring initiation of HD 12/16/19. Admitted to ICU [facility] for trach bleed on 5/19. Was getting MWF HD at [facility] prior to admit here.   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 On TC again. Full code now. CXR had bilat infiltrates, continues on broad-spec IV abx thru 7/8.  Had respiratory distress on 7/6 with copious secretion.  PCCM following.  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   # Sacral osteo/ decub stage IV - as per #2  # ESRD - on HD since mid March 2021. HD MWF at [facility]. S/P new TDC 6/2 by IR.  HD today, UF as tolerated by BP.  # BP/vol status: Ultrafiltration during dialysis. The high BUN is not pre-renal as he has virtually no renal fxn, suspect hypercatabolic given infections and tissue necrosis/ decub.  # Atrial fib - per primary, on eliquis 2.5 BID  # Anemia ckd - transfuse prn, Hb remains 9- 10 range. on ESA  # Hypercalcemia: w/ low PTH, prob from immobility, improved. Cont low 2.0 Ca++ bath , PTH 29.   # SP peg/ divert colostomy  # Disp: Not going to LTAC because of insurance issues.  Patient is clinically declined with no meaningful recovery.  He is not a candidate for outpatient dialysis.  Prognosis is grim and I think he is suffering more.  Strongly recommend hospice/comfort care.  Discussed with the primary team yesterday.  Seen by palliative care however family not willing to talk to them again.  May benefit from ethics committee  meeting.  Subjective: Seen and examined.  Remains on trach.  Lethargic today.  Objective Vital signs in last 24 hours: Vitals:   04/03/20 0447 04/03/20 0728 04/03/20 0752 04/03/20 0930  BP: 118/68 116/61  105/63  Pulse: 88 81 88   Resp: (!) 24 18 (!) 24   Temp:  99 F (37.2 C)    TempSrc:  Axillary  Oral  SpO2: 98% 99% 98%   Weight:      Height:       Weight change:   Intake/Output Summary (Last 24 hours) at 04/03/2020 1020 Last data filed at 04/03/2020 0428 Gross per 24 hour  Intake 744.61 ml  Output 75 ml  Net 669.61 ml       Labs: Basic Metabolic Panel: Recent Labs  Lab 03/31/20 0625 03/31/20 0625 04/01/20 0435 04/02/20 0807 04/03/20 0636  NA 137   < > 136 133* 135  K 4.6   < > 4.0 3.9 4.2  CL 103   < > 103 97* 100  CO2 21*   < > _0 GLUCOSE 115*   < > 129* 130* 115*  BUN 79*   < > 118* 74* 89*  CREATININE 2.74*   < > 3.24* 2.45* 3.06*  CALCIUM 9.1   < > 9.3 9.1 9.1  PHOS 3.4  --  4.0  --  5.9*   < > = values in this interval not displayed.   Liver Function Tests: Recent Labs  Lab 04/01/20 0435 04/02/20 0807 04/03/20 0636  AST  --  33  --   ALT  --  23  --   ALKPHOS  --  110  --   BILITOT  --  0.7  --   PROT  --  7.8  --   ALBUMIN 1.9* 2.0* 1.8*   No results for input(s): LIPASE, AMYLASE in the last 168 hours. No results for input(s): AMMONIA in the last 168 hours. CBC: Recent Labs  Lab 03/29/20 0446 03/29/20 0446 03/30/20 0347 03/30/20 0347 03/31/20 1026 03/31/20 1026 04/01/20 0435 04/02/20 0807 04/03/20 0636  WBC 16.0*   < > 18.0*   < > 17.2*   < > 18.0* 20.2* 14.9*  NEUTROABS 11.4*  --  12.9*  --  12.0*  --   --   --   --   HGB 9.8*   < > 9.8*   < > 9.7*   < > 9.5* 9.8* 9.4*  HCT 32.0*   < > 32.2*   < > 31.1*   < > 30.9* 31.2* 30.5*  MCV 82.7   < > 82.6  --  81.2  --  82.6 80.8 81.6  PLT 257   < > 225   < > 267   < > 222 248 227   < > = values in this interval not displayed.   Cardiac Enzymes: No results for input(s):  CKTOTAL, CKMB, CKMBINDEX, TROPONINI in the last 168 hours. CBG: Recent Labs  Lab 04/02/20 1544 04/02/20 2005 04/02/20 2321 04/03/20 0355 04/03/20 0726  GLUCAP 133* 132* 131* 109* 120*    Iron Studies: No results for input(s): IRON, TIBC, TRANSFERRIN, FERRITIN in the last 72 hours. Studies/Results: DG Abd 1 View  Result Date: 04/02/2020 CLINICAL DATA:  Ileus EXAM: ABDOMEN - 1 VIEW COMPARISON:  Portable exam 1444 hours compared to 11/20/2019 FINDINGS: Air-filled loops of nondistended bowel in the mid abdomen. No bowel dilatation or bowel wall thickening. G-tube present. Bones demineralized. Patient rotated to the LEFT. Scattered atherosclerotic calcifications in pelvis. IMPRESSION: Nonobstructive bowel gas pattern. Electronically Signed   By: Lavonia Dana M.D.   On: 04/02/2020 15:08   DG CHEST PORT 1 VIEW  Result Date: 04/02/2020 CLINICAL DATA:  Dyspnea. EXAM: PORTABLE CHEST 1 VIEW COMPARISON:  Prior chest radiograph 03/28/2020 and earlier FINDINGS: Tracheostomy tube and right-sided dialysis catheter, unchanged in position. Similar appearance of airspace disease predominantly within the left mid to lower lung field. As before, a small left pleural effusion is difficult to exclude. No evidence of pneumothorax. No acute bony abnormality. IMPRESSION: No significant interval change as compared to chest radiograph 03/28/2020. Tracheostomy tube and right-sided dialysis catheter unchanged in position. Similar appearance of airspace disease, predominantly within the left mid to lower lung field. As before, a small left pleural effusion is difficult to exclude. Electronically Signed   By: Kellie Simmering DO   On: 04/02/2020 08:14    Medications: Infusions: . albumin human    . sodium chloride    . sodium chloride    . albumin human    . dextrose 5 % and 0.9% NaCl 30 mL/hr at 04/03/20 0428  . feeding supplement (NEPRO CARB STEADY) 1,000 mL (04/02/20 0537)  . meropenem [facility]) IV 500 mg (04/02/20 2211)     Scheduled Medications: . apixaban  2.5 mg Per Tube BID  . chlorhexidine  15 mL Mouth Rinse BID  . chlorhexidine gluconate (MEDLINE KIT)  15 mL Mouth Rinse BID  . Chlorhexidine Gluconate  Cloth  6 each Topical [id number]  . darbepoetin (ARANESP) injection - DIALYSIS  100 mcg Intravenous Q Mon-HD  . feeding supplement (PRO-STAT SUGAR FREE 64)  30 mL Per Tube BID  . insulin aspart  0-6 Units Subcutaneous Q4H  . insulin aspart  2 Units Subcutaneous Q4H  . levothyroxine  25 mcg Per Tube Q0600  . lipase/protease/amylase)  20,880 Units Per Tube Once   And  . sodium bicarbonate  650 mg Per Tube Once  . mouth rinse  15 mL Mouth Rinse q12n4p  . metoprolol tartrate  25 mg Per Tube BID  . midodrine      . nutrition supplement (JUVEN)  1 packet Per Tube BID BM  . pantoprazole sodium  40 mg Per Tube Daily  . phenylephrine 200 mcg / ml (NON-ED USE ONLY) injection  100 mcg Intracavernosal Once  . vancomycin  500 mg Intravenous Q M,W,F-HD    have reviewed scheduled and prn medications.  Physical Exam: General: Ill-looking male with a tracheostomy, lethargic and not opening eyes today. Heart:RRR, s1s2 nl Lungs: Bibasal coarse breath sound, no wheezing Abdomen:soft, colostomy bag noted.  Also has feeding tube. Extremities:No edema Dialysis Access: Right IJ TDC.  Shane Banks 04/03/2020,10:20 AM  LOS: 49 days  Pager: [phone]

## 2020-04-03 NOTE — Progress Notes (Signed)
[facility] PROGRESS NOTE  Shane Banks IHK:[id number] DOB: 27-Jan-1946 DOA: 02/14/2020 PCP: [name], MD  Status: Inpatient--Remains inpatient appropriate because :Unsafe d/c plan, IV treatments appropriate due to intensity of illness or inability to take PO and Inpatient level of care appropriate due to severity of illness-patient continues to have tenuous respiratory status   Dispo:  Patient From:  Home  Planned Disposition: [facility]  Expected discharge date: 04/01/20  Medically stable for discharge: No-needs to transition to cuffless trach (respiratory status too tenuous to accomplish), needs to be able to sit upright and recliner to pursue dialysis in the outpatient setting (profoundly physically deconditioned and unable to even lift arms at this juncture), -remains on IV antibiotics secondary to sacral osteomyelitis and decubitus and recent bacteremia with last dose due on 7/8   HPI: 74 y.o. male past medical history significant of chronic respiratory failure status post tracheostomy, end-stage renal disease on dialysis, chronic atrial fibrillation nonhemorrhagic stroke. Patient treated for Covid pneumonia January 2021 and had been on the vent because of progressive ARDS.  Because of lack of progression he underwent tracheostomy and [facility].  Was sent back to this facility in May due to trach site bleeding suspected hemoptysis.  He was also noted with sacral decubitus at time of presentation in May.  Since admission he has been treated for multidrug-resistant Pseudomonas pneumonia and MRSA and enterococcal bacteremia.  On 03/15/2020 patient returned to the ICU for ventilatory and pressor support he developed septic shock and developed acute renal failure requiring dialysis.  He developed sepsis physiology likely secondary to sacral decubitus ulcer with underlying osteomyelitis.    He has been weaned from pressors. PCCM is following  weekly for trach weaning with the assistance of SLP.  Patient was liberated from the vent on 03/21/2020 and was transferred to the floor in 03/22/2020.  Due to the lack of insurance approval he cannot go [facility] to [facility].  Social worker is working on Clinical research associate.  He will need to be transition to a cuffless trach and be able to sit upright in a recliner for hemodialysis before he can discharge to skilled nursing facility.  On the morning of 7/6 patient developed hypoxemia and increased work of breathing with shallow respiratory effort, requiring frequent suctioning.  O2 sats decreased into the 70s but improved to 95% with bagging and suctioning.  Briefly required increase of FiO2 to 100% but was eventually decreased back to 40%.  Tracheal aspirate appeared consistent with tube feedings therefore tube feedings were stopped.  Subjective: Examined in hemodialysis More alert than yesterday.  Still with increased work of breathing although and remains quite tachypneic  Objective: Vitals:   04/03/20 1130 04/03/20 1200  BP: 109/63 100/63  Pulse: 88 88  Resp:    Temp:    SpO2:      Intake/Output Summary (Last 24 hours) at 04/03/2020 1212 Last data filed at 04/03/2020 0428 Gross per 24 hour  Intake 744.61 ml  Output 75 ml  Net 669.61 ml   Filed Weights   04/01/20 0513 04/02/20 0323 04/03/20 0930  Weight: 60.3 kg 60.3 kg 65.1 kg    Exam:  Constitutional: NAD, calm but lethargic, comfortable Neck: normal, supple, no masses, no thyromegaly-midline cuffed trach in place  Respiratory: Coarse lung sounds entire lung field, some crackles on the right with right being much more improved as compared to 7/6. Tachypneic with some use of accessory muscles which is baseline for this  patient with a respiratory rate higher than baseline now in the high 20s to 30s.  Trach collar oxygen/FiO2 60% Cardiovascular: Regular rate and rhythm, no murmurs / rubs / gallops.  Soft puffy extremity edema  merrily of hands-some in the feet and ankles. 2+ pedal pulses. No carotid bruits.  Dialysis catheter right subclavian vein.  d5ns IV fluid at 30 cc/h. Abdomen: no tenderness, no masses palpated. No hepatosplenomegaly. Bowel sounds positive.  Colostomy in place with light brown loose stool.  Gastrostomy tube in place with tube feedings now on hold. GU: Condom catheter in place draining yellow-colored urine to bedside bag Musculoskeletal: no clubbing / cyanosis. No joint deformity upper and lower extremities.  Not spontaneously moving UEs but with PROM has adequate range of motion., no contractures.  Diminished muscle tone noting too weak to follow commands with upper extremities.  And unable to lift any of his extremities upon command secondary to generalized weakness. Skin: no rashes, lesions, sacral decubitus not examined today Neurologic: CN 2-12 grossly intact. Sensation intact, DTR not assessed-to assess strength adequately given patient's inability to participate but he was unable to lift either arm off the bed for assessment and also was unable to comply with request to hold up the finger on either hand. 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  PCCM following periodically to assess readiness for transition cuffless trach. Last sputum culture on 6/19 with multidrug-resistant Pseudomonas and follow-up chest x-ray revealed diffuse left-sided airspace disease with patchy right airspace disease Repeat sputum culture obtained by RT on 7/5 On 7/6 respiratory decompensation as noted above.  Aspiration at this juncture appears to be definite contributory factor.  PCCM has reevaluated patient and  since patient is full code if he continues to worsen he will need mechanical ventilatory support.  Of note, family refusing intervention further conversations with palliative medicine team. Continue IV meropenem-WBC once again trending upward- now 18,000 axillary T-max in the past 24 hours 100 F Unfortunately given recurrent pneumonia and increased secretions with associated generalized weakness/weak cough it is highly doubtful he can transition to cuffless trach at this point.  Overall prognosis is poor.  Continue chest physiotherapy with vibra vest administered per RT Agree with PCCM that patient at risk for transitioning back to mechanical ventilation  Diarrhea Likely noninfectious in etiology-C. difficile negative Suspect related to antibiotics and other medications bolus tube feeding Appears to be slowing-fluids discontinued over the weekend since is dialysis patient  Sacral osteomyelitis and HD catheter sepsis/  Enterococcal bacteremia: Tunneled catheter removal on 02/21/2020. Has tunneled HD catheter right subclavian-side unremarkable Will require 6 weeks of IV antibiotics per ID (vancomycin and Flagyl.) Currently has PIV in place for antibiotics-last dose 7/8  Paroxysmal atrial fibrillation/Acquired thrombophilia: Currently rate controlled on metoprolol Anticoagulation was discontinued due to suspected GI bleed.  End-stage renal disease: On HD since March 2021 status post tunneled catheter placement 02/28/2020 and as of 7/1 site unremarkable. Dialysis days: MWF SNF reports patient will need to be able to sit upright in recliner to receive hemodialysis before they will accept him as a patient Unfortunately, this patient continues to decline, he has significant weakness and is unable to tolerate sitting up in a recliner receive outpatient hemodialysis.  If he makes no significant improvements over the next 5  to 7 days it is likely the nephrologist will need to contact with the family  to update on status and to inform them that he is no longer a candidate for outpatient hemodialysis  Chronic normocytic anemia: Due to anemia of chronic renal disease Status post1 unit of PRBCs Continue erythropoietin and IV iron per renal.  History of nonhemorrhagic CVA: CT of the head showed atrophy and chronic microvascular disease. Not on anticoagulation due to possible GI bleed.  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 Documented as full-thickness tissue loss  Severe protein caloric malnutrition: Tube feedings on hold due to concerns of a possible aspiration as above.  Patient is dialysis patient but will allow D5 normal saline at 30 cc/h to replace tube feeding volume loss Will allow medications per tube with small amounts of flush Nutrition Status: Nutrition Problem: Severe Malnutrition Etiology: chronic illness (chronic respiratory failure related to COVID ARDS) Signs/Symptoms: severe muscle depletion, severe fat depletion Interventions: Prostat, Tube feeding, Juven-tube feeding on hold given recent aspiration event-sitter resuming at lower rate on 7/8      Data Reviewed: Basic Metabolic Panel: Recent Labs  Lab 03/27/20 2117 03/28/20 0233 03/29/20 0446 03/29/20 0446 03/30/20 0347 03/31/20 0625 04/01/20 0435 04/02/20 0807 04/03/20 0636  NA  --    < > 137   < > 138 137 136 133* 135  K 3.5   < > 3.4*   < > 4.1 4.6 4.0 3.9 4.2  CL  --    < > 99   < > 102 103 103 97* 100  CO2  --    < > 28   < > 25 21* 22 26 25   GLUCOSE  --    < > 132*   < > 133* 115* 129* 130* 115*  BUN  --    < > 101*   < > 43* 79* 118* 74* 89*  CREATININE  --    < > 3.11*   < > 1.91* 2.74* 3.24* 2.45* 3.06*  CALCIUM  --    < > 9.5   < > 9.1 9.1 9.3 9.1 9.1  MG 1.7  --   --   --   --   --   --   --   --   PHOS  --    < > 3.5  --  1.9* 3.4 4.0  --  5.9*   < > = values in this interval not displayed.   Liver Function Tests: Recent Labs  Lab  03/30/20 0347 03/31/20 0625 04/01/20 0435 04/02/20 0807 04/03/20 0636  AST  --   --   --  33  --   ALT  --   --   --  23  --   ALKPHOS  --   --   --  110  --   BILITOT  --   --   --  0.7  --   PROT  --   --   --  7.8  --   ALBUMIN 2.2* 1.9* 1.9* 2.0* 1.8*   No results for input(s): LIPASE, AMYLASE in the last 168 hours. No results for input(s): AMMONIA in the last 168 hours. CBC: Recent Labs  Lab 03/28/20 0233 03/28/20 0233 03/29/20 0446 03/29/20 0446 03/30/20 0347 03/31/20 1026 04/01/20 0435 04/02/20 0807 04/03/20 0636  WBC 18.4*   < > 16.0*   < > 18.0* 17.2* 18.0* 20.2* 14.9*  NEUTROABS 12.5*  --  11.4*  --  12.9* 12.0*  --   --   --   HGB 9.6*   < > 9.8*   < > 9.8* 9.7* 9.5* 9.8* 9.4*  HCT 31.3*   < > 32.0*   < > 32.2* 31.1* 30.9* 31.2* 30.5*  MCV 82.2   < > 82.7   < > 82.6 81.2 82.6 80.8 81.6  PLT 228   < > 257   < > 225 267 222 248 227   < > = values in this interval not displayed.   Cardiac Enzymes: No results for input(s): CKTOTAL, CKMB, CKMBINDEX, TROPONINI in the last 168 hours. BNP (last 3 results) No results for input(s): BNP in the last 8760 hours.  ProBNP (last 3 results) No results for input(s): PROBNP in the last 8760 hours.  CBG: Recent Labs  Lab 04/02/20 1544 04/02/20 2005 04/02/20 2321 04/03/20 0355 04/03/20 0726  GLUCAP 133* 132* 131* 109* 120*    Recent Results (from the past 240 hour(s))  MRSA PCR Screening     Status: None   Collection Time: 03/27/20  7:59 PM   Specimen: Nasal Mucosa; Nasopharyngeal  Result Value Ref Range Status   MRSA by PCR NEGATIVE NEGATIVE Final    Comment:        The GeneXpert MRSA Assay (FDA approved for NASAL specimens only), is one component of a comprehensive MRSA colonization surveillance program. It is not intended to diagnose MRSA infection nor to guide or monitor treatment for MRSA infections. Performed at [facility], [place] [address] [place], [place] [address]   Culture, Urine      Status: Abnormal   Collection Time: 03/28/20  2:10 PM   Specimen: Urine, Random  Result Value Ref Range Status   Specimen Description URINE, RANDOM  Final   Special Requests   Final    NONE Performed at [facility], [place] [address], [place], [place] [address]    Culture 30,000 COLONIES/mL YEAST (A)  Final   Report Status 03/29/2020 FINAL  Final  C Difficile Quick Screen (NO PCR Reflex)     Status: None   Collection Time: 03/29/20 10:20 AM   Specimen: STOOL  Result Value Ref Range Status   C Diff antigen NEGATIVE NEGATIVE Final   C Diff toxin NEGATIVE NEGATIVE Final   C Diff interpretation No C. difficile detected.  Final    Comment: Performed at [facility], [place] [address], [place], [place] [address]  Culture, respiratory (non-expectorated)     Status: None (Preliminary result)   Collection Time: 04/01/20  9:24 AM   Specimen: Tracheal Aspirate; Respiratory  Result Value Ref Range Status   Specimen Description TRACHEAL ASPIRATE  Final   Special Requests NONE  Final   Gram Stain   Final    RARE WBC PRESENT, PREDOMINANTLY MONONUCLEAR FEW SQUAMOUS EPITHELIAL CELLS PRESENT RARE GRAM NEGATIVE RODS    Culture   Final    FEW PSEUDOMONAS AERUGINOSA REPEATING SUSCEPTIBILITY Performed at [facility], [place] [address] [place], [place] [address]    Report Status PENDING  Incomplete     Studies: DG Abd 1 View  Result Date: 04/02/2020 CLINICAL DATA:  Ileus EXAM: ABDOMEN - 1 VIEW COMPARISON:  Portable exam 1444 hours compared to 11/20/2019 FINDINGS: Air-filled loops of nondistended bowel in the mid abdomen. No bowel dilatation or bowel wall thickening. G-tube present. Bones demineralized. Patient rotated to the LEFT. Scattered atherosclerotic calcifications in pelvis. IMPRESSION: Nonobstructive bowel gas pattern. Electronically Signed  By: Lavonia Dana M.D.   On: 04/02/2020 15:08   DG CHEST PORT 1 VIEW  Result Date: 04/02/2020 CLINICAL DATA:  Dyspnea. EXAM: PORTABLE  CHEST 1 VIEW COMPARISON:  Prior chest radiograph 03/28/2020 and earlier FINDINGS: Tracheostomy tube and right-sided dialysis catheter, unchanged in position. Similar appearance of airspace disease predominantly within the left mid to lower lung field. As before, a small left pleural effusion is difficult to exclude. No evidence of pneumothorax. No acute bony abnormality. IMPRESSION: No significant interval change as compared to chest radiograph 03/28/2020. Tracheostomy tube and right-sided dialysis catheter unchanged in position. Similar appearance of airspace disease, predominantly within the left mid to lower lung field. As before, a small left pleural effusion is difficult to exclude. Electronically Signed   By: Kellie Simmering DO   On: 04/02/2020 08:14    Scheduled Meds: . apixaban  2.5 mg Per Tube BID  . chlorhexidine  15 mL Mouth Rinse BID  . chlorhexidine gluconate (MEDLINE KIT)  15 mL Mouth Rinse BID  . Chlorhexidine Gluconate Cloth  6 each Topical Q0600  . darbepoetin (ARANESP) injection - DIALYSIS  100 mcg Intravenous Q Mon-HD  . feeding supplement (PRO-STAT SUGAR FREE 64)  30 mL Per Tube BID  . insulin aspart  0-6 Units Subcutaneous Q4H  . insulin aspart  2 Units Subcutaneous Q4H  . levothyroxine  25 mcg Per Tube Q0600  . lipase/protease/amylase)  20,880 Units Per Tube Once   And  . sodium bicarbonate  650 mg Per Tube Once  . mouth rinse  15 mL Mouth Rinse q12n4p  . metoprolol tartrate  25 mg Per Tube BID  . midodrine      . nutrition supplement (JUVEN)  1 packet Per Tube BID BM  . pantoprazole sodium  40 mg Per Tube Daily  . phenylephrine 200 mcg / ml (NON-ED USE ONLY) injection  100 mcg Intracavernosal Once  . vancomycin  500 mg Intravenous Q M,W,F-HD   Continuous Infusions: . sodium chloride    . sodium chloride    . albumin human    . albumin human    . albumin human    . albumin human    . dextrose 5 % and 0.9% NaCl 30 mL/hr at 04/03/20 0428  . feeding supplement (NEPRO  CARB STEADY) 1,000 mL (04/02/20 0537)  . meropenem (MERREM) IV 500 mg (04/02/20 2211)    Principal Problem:   Enterococcal bacteremia Active Problems:   Acute respiratory failure (HCC)   Hemoptysis   Pressure injury of skin   Protein-calorie malnutrition, severe   Decubitus ulcer of sacral region, stage 4 (HCC)   Fever   Palliative care by specialist   Goals of care, counseling/discussion   DNR (do not resuscitate)     Code Status: Full Family Communication: [name] sister 03/29/20    Consultants: PCCM  Procedures:  Echocardiogram  Antibiotics: Anti-infectives (From admission, onward)   Start     Dose/Rate Route Frequency Ordered Stop   04/01/20 1013  vancomycin (VANCOCIN) 500-5 MG/100ML-% IVPB       Note to Pharmacy: Cherylann Banas   : cabinet override      04/01/20 1013 04/01/20 1253   03/29/20 1552  vancomycin (VANCOCIN) 500-5 MG/100ML-% IVPB       Note to Pharmacy: Anthonette Legato, Melisa   : cabinet override      03/29/20 1552 03/29/20 1556   03/29/20 1415  fluconazole (DIFLUCAN) 40 MG/ML suspension 100 mg        100  mg Oral Daily 03/29/20 1408 03/31/20 1015   03/28/20 2000  meropenem (MERREM) 500 mg in sodium chloride 0.9 % 100 mL IVPB     Discontinue     500 mg 200 mL/hr over 30 Minutes Intravenous Every 24 hours 03/28/20 1623 04/04/20 2359   03/25/20 1800  cefTAZidime (FORTAZ) 1 g in sodium chloride 0.9 % 100 mL IVPB  Status:  Discontinued       "Followed by" Linked Group Details   1 g 200 mL/hr over 30 Minutes Intravenous Every 24 hours 03/18/20 1015 03/28/20 1613   03/22/20 0830  vancomycin (VANCOREADY) IVPB 500 mg/100 mL        500 mg 100 mL/hr over 60 Minutes Intravenous  Once 03/22/20 0827 03/22/20 1014   03/20/20 1200  vancomycin (VANCOCIN) IVPB 500 mg/100 ml premix     Discontinue     500 mg 100 mL/hr over 60 Minutes Intravenous Every M-W-F (Hemodialysis) 03/19/20 0941 04/04/20 2359   03/18/20 1800  meropenem (MERREM) 500 mg in sodium chloride 0.9 %  100 mL IVPB       "Followed by" Linked Group Details   500 mg 200 mL/hr over 30 Minutes Intravenous Every 24 hours 03/18/20 1015 03/25/20 0026   03/18/20 1200  vancomycin (VANCOCIN) IVPB 500 mg/100 ml premix        500 mg 100 mL/hr over 60 Minutes Intravenous Every M-W-F (Hemodialysis) 03/18/20 0938 03/18/20 1700   03/18/20 1200  cefTAZidime (FORTAZ) 2 g in sodium chloride 0.9 % 100 mL IVPB  Status:  Discontinued        2 g 200 mL/hr over 30 Minutes Intravenous Every Mon (Hemodialysis) 03/18/20 0939 03/18/20 1015   03/07/20 1800  cefTAZidime (FORTAZ) 2 g in sodium chloride 0.9 % 100 mL IVPB  Status:  Discontinued        2 g 200 mL/hr over 30 Minutes Intravenous Every T-Th-Sa (1800) 03/06/20 1255 03/18/20 1015   03/07/20 1200  vancomycin (VANCOCIN) IVPB 500 mg/100 ml premix  Status:  Discontinued        500 mg 100 mL/hr over 60 Minutes Intravenous Every T-Th-Sa (Hemodialysis) 03/06/20 1255 03/19/20 0941   03/04/20 2000  cefTAZidime (FORTAZ) 2 g in sodium chloride 0.9 % 100 mL IVPB  Status:  Discontinued        2 g 200 mL/hr over 30 Minutes Intravenous Every M-W-F (2000) 03/01/20 1136 03/06/20 1255   03/02/20 1200  vancomycin (VANCOCIN) IVPB 500 mg/100 ml premix        500 mg 100 mL/hr over 60 Minutes Intravenous Every T-Th-Sa (Hemodialysis) 03/01/20 1131 03/02/20 1710   03/01/20 1230  vancomycin (VANCOCIN) IVPB 500 mg/100 ml premix  Status:  Discontinued        500 mg 100 mL/hr over 60 Minutes Intravenous Every M-W-F (Hemodialysis) 03/01/20 1131 03/06/20 1255   02/28/20 1110  vancomycin (VANCOREADY) IVPB 500 mg/100 mL        over 60 Minutes  Continuous PRN 02/28/20 1112 02/28/20 1110   02/28/20 1101  vancomycin (VANCOCIN) 1-5 GM/200ML-% IVPB  Status:  Discontinued       Note to Pharmacy: Lytle Butte   : cabinet override      02/28/20 1101 02/28/20 1327   02/27/20 1800  cefTAZidime (FORTAZ) 1 g in sodium chloride 0.9 % 100 mL IVPB        1 g 200 mL/hr over 30 Minutes Intravenous  Every 24 hours 02/27/20 0947 03/03/20 1738   02/27/20 1200  vancomycin (VANCOCIN) IVPB 500  mg/100 ml premix        500 mg 100 mL/hr over 60 Minutes Intravenous Once 02/27/20 0829 02/27/20 1343   02/24/20 1800  vancomycin (VANCOREADY) IVPB 500 mg/100 mL        500 mg 100 mL/hr over 60 Minutes Intravenous  Once 02/24/20 1308 02/24/20 1905   02/22/20 2200  metroNIDAZOLE (FLAGYL) tablet 500 mg  Status:  Discontinued        500 mg Per Tube Every 8 hours 02/22/20 0957 03/30/20 1057   02/22/20 1700  cefTRIAXone (ROCEPHIN) 2 g in sodium chloride 0.9 % 100 mL IVPB  Status:  Discontinued        2 g 200 mL/hr over 30 Minutes Intravenous Every 24 hours 02/22/20 0957 02/27/20 0947   02/22/20 1200  vancomycin (VANCOCIN) IVPB 500 mg/100 ml premix  Status:  Discontinued        500 mg 100 mL/hr over 60 Minutes Intravenous Every T-Th-Sa (Hemodialysis) 02/20/20 1253 02/21/20 1040   02/21/20 1431  vancomycin variable dose per unstable renal function (pharmacist dosing)  Status:  Discontinued         Does not apply See admin instructions 02/21/20 1431 03/01/20 1131   02/21/20 1200  vancomycin (VANCOREADY) IVPB 500 mg/100 mL        500 mg 100 mL/hr over 60 Minutes Intravenous  Once 02/21/20 0956 02/21/20 1253   02/21/20 0714  vancomycin (VANCOCIN) 500-5 MG/100ML-% IVPB  Status:  Discontinued       Note to Pharmacy: Ashley Akin   : cabinet override      02/21/20 0714 02/21/20 1346   02/20/20 1400  vancomycin (VANCOREADY) IVPB 1250 mg/250 mL        1,250 mg 166.7 mL/hr over 90 Minutes Intravenous  Once 02/20/20 1253 02/20/20 1538   02/20/20 1200  levofloxacin (LEVAQUIN) IVPB 500 mg  Status:  Discontinued        500 mg 100 mL/hr over 60 Minutes Intravenous Every 48 hours 02/20/20 1020 02/21/20 0943   02/15/20 1545  vancomycin (VANCOCIN) IVPB 1000 mg/200 mL premix  Status:  Discontinued       "Followed by" Linked Group Details   1,000 mg 200 mL/hr over 60 Minutes Intravenous Every 24 hours 02/14/20 1530  02/14/20 1745   02/15/20 0330  ceFEPIme (MAXIPIME) 2 g in sodium chloride 0.9 % 100 mL IVPB  Status:  Discontinued        2 g 200 mL/hr over 30 Minutes Intravenous Every 12 hours 02/14/20 1531 02/14/20 1531   02/14/20 1531  ceFEPIme (MAXIPIME) 2 g in sodium chloride 0.9 % 100 mL IVPB  Status:  Discontinued        2 g 200 mL/hr over 30 Minutes Intravenous Every 12 hours 02/14/20 1531 02/14/20 1745   02/14/20 1530  ceFEPIme (MAXIPIME) 2 g in sodium chloride 0.9 % 100 mL IVPB  Status:  Discontinued        2 g 200 mL/hr over 30 Minutes Intravenous  Once 02/14/20 1520 02/14/20 1531   02/14/20 1530  vancomycin (VANCOREADY) IVPB 1500 mg/300 mL  Status:  Discontinued       "Followed by" Linked Group Details   1,500 mg 150 mL/hr over 120 Minutes Intravenous  Once 02/14/20 1530 02/14/20 1745        Time spent: 35 minutes    Erin Hearing ANP [facility] Pager [phone]. If 7PM-7AM, please contact night-coverage at [email or web address] 04/03/2020, 12:12 PM  LOS: 49 days

## 2020-04-03 NOTE — Progress Notes (Signed)
PT Cancellation Note  Patient Details Name: Shane Banks MRN: [id number] DOB: [birth year]   Cancelled Treatment:    Reason Eval/Treat Not Completed: Patient at procedure or test/unavailable - at HD, will check back as able.   [facility] Pager [phone]  Office [phone]    Roxine Caddy D Elonda Husky 04/03/2020, 9:54 AM

## 2020-04-04 ENCOUNTER — Inpatient Hospital Stay (HOSPITAL_COMMUNITY): Payer: Medicare HMO

## 2020-04-04 LAB — GLUCOSE, CAPILLARY
Glucose-Capillary: 107 mg/dL — ABNORMAL HIGH (ref 70–99)
Glucose-Capillary: 116 mg/dL — ABNORMAL HIGH (ref 70–99)
Glucose-Capillary: 122 mg/dL — ABNORMAL HIGH (ref 70–99)
Glucose-Capillary: 127 mg/dL — ABNORMAL HIGH (ref 70–99)
Glucose-Capillary: 139 mg/dL — ABNORMAL HIGH (ref 70–99)
Glucose-Capillary: 83 mg/dL (ref 70–99)
Glucose-Capillary: 86 mg/dL (ref 70–99)

## 2020-04-04 MED ORDER — FLUCONAZOLE 40 MG/ML PO SUSR
200.0000 mg | Freq: Once | ORAL | Status: AC
  Administered 2020-04-04: 200 mg
  Filled 2020-04-04: qty 5

## 2020-04-04 MED ORDER — CHLORHEXIDINE GLUCONATE CLOTH 2 % EX PADS
6.0000 | MEDICATED_PAD | Freq: Every day | CUTANEOUS | Status: DC
  Administered 2020-04-04: 6 via TOPICAL

## 2020-04-04 MED ORDER — FLUCONAZOLE 40 MG/ML PO SUSR
100.0000 mg | Freq: Every day | ORAL | Status: DC
  Administered 2020-04-05 – 2020-04-15 (×10): 100 mg
  Filled 2020-04-04 (×11): qty 2.5

## 2020-04-04 MED ORDER — MAGNESIUM SULFATE 2 GM/50ML IV SOLN
2.0000 g | Freq: Once | INTRAVENOUS | Status: AC
  Administered 2020-04-04: 2 g via INTRAVENOUS
  Filled 2020-04-04: qty 50

## 2020-04-04 NOTE — Evaluation (Signed)
Less than 5cc residual from gastric tube aspiration

## 2020-04-04 NOTE — Progress Notes (Signed)
Physical Therapy Treatment Patient Details Name: Shane Banks MRN: [id number] DOB: November 08, 1945 Today's Date: 04/04/2020    History of Present Illness 74 yo m with PMHx of afib with RVR, BPH, stroke, and COVID ARDS in January requiring trach and LTACH placement at [facility]. Had been tolerating trach collar, but on 5/19 developed hemoptysis and hypoxemic respiratory failure requiring vent. Transferred to [facility] on 5/19.  5/19 back on vent, 6/7 transitioned to [facility]    PT Comments    Pt minimally engaged in PT session this day, interacting with PT very little. Pt with eyes closed for much of session, and pt unable to participate in LE PROM with multimodal PT cuing. PT to sign off at this time given poor rehab prognosis and little to no engagement in PT. PT encourages RN staff to perform PROM, don/doff prevalon boots, and alter pt positioning for pressure relief. PT positioned pt for posterior pressure relief upon leaving room.   PT to sign off, please reconsult if needed.     Follow Up Recommendations  SNF;Supervision/Assistance - 24 hour (vs LTACH)     Equipment Recommendations  Other (comment) (defer to next venue)    Recommendations for Other Services       Precautions / Restrictions Precautions Precautions: Fall;Other (comment) Precaution Comments: trach collar, PEG (feedings paused during mobility) Restrictions Weight Bearing Restrictions: No    Mobility  Bed Mobility Overal bed mobility: Needs Assistance             General bed mobility comments: not attempted  Transfers                 General transfer comment: not attempted  Ambulation/Gait                 Stairs             Wheelchair Mobility    Modified Rankin (Stroke Patients Only)       Balance                                            Cognition Arousal/Alertness: Lethargic Behavior During Therapy: Flat affect Overall Cognitive Status:  Difficult to assess                                 General Comments: minimal engagement with PT, mostly raises eyebrows to respond to questions with the exeception of one affirmative head nod. Pt with many periods of eye closing during session      Exercises General Exercises - Upper Extremity Shoulder Flexion: PROM;Both;5 reps;Supine Shoulder Horizontal ABduction: PROM;Both;5 reps;Supine Shoulder Horizontal ADduction: PROM;Both;5 reps;Supine Elbow Flexion: PROM;5 reps;Supine Elbow Extension: PROM;Both;5 reps;Supine Wrist Flexion: PROM;Both;5 reps;Supine Wrist Extension: PROM;Both;5 reps;Supine Digit Composite Flexion: PROM;Both;5 reps;Supine Composite Extension: PROM;Both;5 reps;Supine General Exercises - Lower Extremity Heel Slides: PROM;Both;20 reps;Supine Hip ABduction/ADduction: Both;10 reps;Supine;PROM Other Exercises Other Exercises: Hip ER/IR stretch, x10 bilaterally, sustained hold ~5 seconds at end range Other Exercises: PF stretch, 2x30 seconds bilaterally Other Exercises: positioned with pillow under R side gluteal region for posterior pressure relief, bilateral UEs elevated on pillows, HOB elevated >30*    General Comments General comments (skin integrity, edema, etc.): HR up to 95bpm with PROM stretching with intermittent grimacing, SPO2 WFL, RR from 12 to 30 during session. Focused on PROM stretching and positioning with  pillows to decrease swelling and edema. Notified nursing staff and left reminder sign in pt's room      Pertinent Vitals/Pain Pain Assessment: Faces Faces Pain Scale: Hurts even more Pain Location: RLE PROM especially hip flexion Pain Descriptors / Indicators: Grimacing;Discomfort Pain Intervention(s): Limited activity within patient's tolerance;Monitored during session;Repositioned;Relaxation    Home Living                      Prior Function            PT Goals (current goals can now be found in the care plan  section) Acute Rehab PT Goals Patient Stated Goal: not stated Time For Goal Achievement: 04/04/20 Potential to Achieve Goals: Fair Progress towards PT goals: Not progressing toward goals - comment (Total care, minimal engagement in PT, declining - PT to sign off)    Frequency    Min 2X/week      PT Plan Current plan remains appropriate    Co-evaluation              AM-PAC PT "6 Clicks" Mobility   Outcome Measure  Help needed turning from your back to your side while in a flat bed without using bedrails?: Total Help needed moving from lying on your back to sitting on the side of a flat bed without using bedrails?: Total Help needed moving to and from a bed to a chair (including a wheelchair)?: Total Help needed standing up from a chair using your arms (e.g., wheelchair or bedside chair)?: Total Help needed to walk in hospital room?: Total Help needed climbing 3-5 steps with a railing? : Total 6 Click Score: 6    End of Session Equipment Utilized During Treatment: Oxygen Activity Tolerance: Patient limited by fatigue;Patient limited by lethargy Patient left: with call bell/phone within reach;in bed;with bed alarm set;Other (comment) (prevalon boots donned) Nurse Communication: Mobility status PT Visit Diagnosis: Muscle weakness (generalized) (M62.81);Adult, failure to thrive (R62.7);Other abnormalities of gait and mobility (R26.89);Pain     Time: 1232-1252 PT Time Calculation (min) (ACUTE ONLY): 20 min  Charges:  $Therapeutic Exercise: 8-22 mins                     Afsana Liera E, PT Acute Rehabilitation Services Pager [phone]  Office [phone]   Ellsie Violette D Elonda Husky 04/04/2020, 2:51 PM

## 2020-04-04 NOTE — Progress Notes (Addendum)
[facility] PROGRESS NOTE  Shane Banks SXJ:[id number] DOB: 17-Mar-1946 DOA: 02/14/2020 PCP: [name], MD  Status: Inpatient--Remains inpatient appropriate because :Unsafe d/c plan, IV treatments appropriate due to intensity of illness or inability to take PO and Inpatient level of care appropriate due to severity of illness-patient continues to have tenuous respiratory status   Dispo:  Patient From:  Home  Planned Disposition: [place]  Expected discharge date: 04/01/20  Medically stable for discharge: No-needs to transition to cuffless trach (respiratory status too tenuous to accomplish), needs to be able to sit upright in recliner to pursue dialysis in the outpatient setting (profoundly physically deconditioned and unable to even lift arms at this juncture), -remains on IV antibiotics secondary to sacral osteomyelitis and decubitus and recent bacteremia with last dose due on 7/8  **Spoke with [name] on 7/8.  After extensive update on patient's lack of progress and recommendation from nephrology that patient is no longer candidate for outpatient hemodialysis family willing to meet with ethics physician Dr. Andria Frames next week-they have been given a list of available time slots for 7/13 and will notify nursing staff of time requested so Dr. Andria Frames can be notified**  HPI: 74 y.o. male past medical history significant of chronic respiratory failure status post tracheostomy, end-stage renal disease on dialysis, chronic atrial fibrillation nonhemorrhagic stroke. Patient treated for Covid pneumonia January 2021 and had been on the vent because of progressive ARDS.  Because of lack of progression he underwent tracheostomy and [facility].  Was sent back to this facility in May due to trach site bleeding suspected hemoptysis.  He was also noted with sacral decubitus at time of presentation in May.  Since admission he has been treated for  multidrug-resistant Pseudomonas pneumonia and MRSA and enterococcal bacteremia.  On 03/15/2020 patient returned to the ICU for ventilatory and pressor support he developed septic shock and developed acute renal failure requiring dialysis.  He developed sepsis physiology likely secondary to sacral decubitus ulcer with underlying osteomyelitis.    He has been weaned from pressors. PCCM is following weekly for trach weaning with the assistance of SLP.  Patient was liberated from the vent on 03/21/2020 and was transferred to the floor in 03/22/2020.  Due to the lack of insurance approval he cannot go [facility] to [facility].  Social worker is working on Clinical research associate.  He will need to be transition to a cuffless trach and be able to sit upright in a recliner for hemodialysis before he can discharge to skilled nursing facility.  On the morning of 7/6 patient developed hypoxemia and increased work of breathing with shallow respiratory effort, requiring frequent suctioning.  O2 sats decreased into the 70s but improved to 95% with bagging and suctioning.  Briefly required increase of FiO2 to 100% but was eventually decreased back to 40%.  Tracheal aspirate appeared consistent with tube feedings therefore tube feedings were stopped.  Subjective: Wake and able to convey that he has no specific complaints; nods no when asked if having difficulty breathing.  And denies pain when asked  Objective: Vitals:   04/04/20 0835 04/04/20 0858  BP: 134/72   Pulse: 96 95  Resp: (!) 22 (!) 23  Temp: 98.3 F (36.8 C)   SpO2: 95% 98%    Intake/Output Summary (Last 24 hours) at 04/04/2020 1036 Last data filed at 04/04/2020 1002 Gross per 24 hour  Intake 447.49 ml  Output 1660 ml  Net -1212.51 ml  Filed Weights   04/02/20 0323 04/03/20 0930 04/03/20 1400  Weight: 60.3 kg 65.1 kg 64 kg    Exam:  Constitutional: NAD, calm but lethargic, comfortable Neck: normal, supple, no masses-midline cuffed trach in  place  Respiratory: Has just completed CPT treatment.  Bilateral lung sounds with coarse rhonchi on expiration.  Tachypneic but without significant increased work of breathing, trach collar oxygen/FiO2 60% Cardiovascular: Regular rate and rhythm, no murmurs / rubs / gallops.  Soft puffy extremity edema  of hands-some in the feet and ankles. 2+ pedal pulses. No carotid bruits.  Dialysis catheter right subclavian vein.  D5NS IV fluid at 30 cc/h. Abdomen: no tenderness, no masses palpated. No hepatosplenomegaly. Bowel sounds positive.  Colostomy in place with light brown loose stool.  Gastrostomy tube clamped GU: Condom catheter in place draining yellow-colored urine to bedside bag Musculoskeletal: no clubbing / cyanosis. No joint deformity upper and lower extremities.  Not spontaneously moving UEs but with PROM has adequate range of motion., no contractures.  Diminished muscle tone noting too weak to follow commands with upper extremities and unable to lift any of his extremities upon command secondary to generalized weakness. Skin: no rashes, lesions, sacral decubitus not examined today Neurologic: CN 2-12 grossly intact. Sensation intact, DTR not assessed-to assess strength adequately given patient's inability to participate but he was unable to lift either arm off the bed for assessment and also was unable to comply with request to hold up the finger on either hand. 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  PCCM following periodically to assess readiness for transition cuffless trach. History of multidrug-resistant Pseudomonas and follow-up chest x-ray revealed diffuse left-sided airspace disease  with patchy right airspace disease On 7/6 respiratory decompensation as noted above.  Aspiration at this juncture appears to be definite contributory factor.  PCCM has reevaluated patient and since patient is full code if he continues to worsen he will need mechanical ventilatory support.  Of note, family refusing intervention further conversations with palliative medicine team.  Will contact ethics team today for input Continue IV meropenem-culture with few Pseudomonas with final culture pending Unfortunately given recurrent pneumonia and increased secretions with associated generalized weakness/weak cough it is highly doubtful he can transition to cuffless trach at this point.  Overall prognosis is poor.  Continue chest physiotherapy with vibra vest administered per RT Current sats are stable and greater than 97% on 60% FiO2-wean per RT protocol  Diarrhea Likely noninfectious in etiology-C. difficile negative Suspect related to antibiotics and other medications bolus tube feeding  Sacral osteomyelitis and HD catheter sepsis/  Enterococcal bacteremia: Tunneled catheter removal on 02/21/2020. Has tunneled HD catheter right subclavian-side unremarkable Will require 6 weeks of IV antibiotics per ID (vancomycin and Flagyl.) Currently has PIV in place for antibiotics-last dose 7/8  Paroxysmal atrial fibrillation/Acquired thrombophilia: Currently rate controlled on metoprolol Anticoagulation was discontinued due to suspected GI bleed.  End-stage renal disease: On HD since March 2021 status post tunneled catheter placement 02/28/2020 and as of 7/1 site unremarkable. Dialysis days: MWF SNF reports patient will need to be able to sit upright in recliner to receive hemodialysis before they will accept him as a patient Unfortunately, this patient continues to decline, he has significant weakness and is unable to tolerate sitting up in  a recliner receive outpatient hemodialysis.  Attending nephrologist  has documented that patient is not a candidate for outpatient hemodialysis  Chronic normocytic anemia: Due to anemia of chronic renal disease Status post1 unit of PRBCs Continue erythropoietin and IV iron per renal.  History of nonhemorrhagic CVA: CT of the head showed atrophy and chronic microvascular disease. Not on anticoagulation due to possible GI bleed.  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 Documented as full-thickness tissue loss  Severe protein caloric malnutrition: Tube feedings on hold due to concerns of a possible aspiration as above.  Patient is dialysis patient but will allow D5 normal saline at 30 cc/h to replace tube feeding volume loss Will allow medications per tube with small amounts of flush Nutrition Status: Nutrition Problem: Severe Malnutrition Etiology: chronic illness (chronic respiratory failure related to COVID ARDS) Signs/Symptoms: severe muscle depletion, severe fat depletion Interventions: Prostat, Tube feeding, Juven-plan to resume tube feeding slowly beginning at 10 cc/h and increasing fill out goal rate.  Also plan to check every 4 hour gastric residuals      Data Reviewed: Basic Metabolic Panel: Recent Labs  Lab 03/30/20 0347 03/30/20 0347 03/31/20 0625 04/01/20 0435 04/02/20 0807 04/03/20 0636 04/03/20 2033  NA 138   < > 137 136 133* 135 134*  K 4.1   < > 4.6 4.0 3.9 4.2 3.0*  CL 102   < > 103 103 97* 100 99  CO2 25   < > 21* _0 GLUCOSE 133*   < > 115* 129* 130* 115* 114*  BUN 43*   < > 79* 118* 74* 89* 34*  CREATININE 1.91*   < > 2.74* 3.24* 2.45* 3.06* 1.40*  CALCIUM 9.1   < > 9.1 9.3 9.1 9.1 8.2*  MG  --   --   --   --   --   --  1.5*  PHOS 1.9*  --  3.4 4.0  --  5.9* 2.8   < > = values in this interval not displayed.   Liver Function Tests: Recent Labs  Lab 03/31/20 0625 04/01/20 0435 04/02/20 0807 04/03/20 0636 04/03/20 2033  AST  --   --  33  --   --    ALT  --   --  23  --   --   ALKPHOS  --   --  110  --   --   BILITOT  --   --  0.7  --   --   PROT  --   --  7.8  --   --   ALBUMIN 1.9* 1.9* 2.0* 1.8* 2.5*   No results for input(s): LIPASE, AMYLASE in the last 168 hours. No results for input(s): AMMONIA in the last 168 hours. CBC: Recent Labs  Lab 03/29/20 0446 03/29/20 0446 03/30/20 0347 03/31/20 1026 04/01/20 0435 04/02/20 0807 04/03/20 0636  WBC 16.0*   < > 18.0* 17.2* 18.0* 20.2* 14.9*  NEUTROABS 11.4*  --  12.9* 12.0*  --   --   --   HGB 9.8*   < > 9.8* 9.7* 9.5* 9.8* 9.4*  HCT 32.0*   < > 32.2* 31.1* 30.9* 31.2* 30.5*  MCV 82.7   < > 82.6 81.2 82.6 80.8 81.6  PLT 257   < > 225 267 222 248 227   < > = values in this interval not displayed.   Cardiac Enzymes: No results for input(s): CKTOTAL, CKMB, CKMBINDEX, TROPONINI in the last  168 hours. BNP (last 3 results) No results for input(s): BNP in the last 8760 hours.  ProBNP (last 3 results) No results for input(s): PROBNP in the last 8760 hours.  CBG: Recent Labs  Lab 04/03/20 1506 04/03/20 2005 04/04/20 0030 04/04/20 0404 04/04/20 0831  GLUCAP 109* 111* 116* 83 86    Recent Results (from the past 240 hour(s))  MRSA PCR Screening     Status: None   Collection Time: 03/27/20  7:59 PM   Specimen: Nasal Mucosa; Nasopharyngeal  Result Value Ref Range Status   MRSA by PCR NEGATIVE NEGATIVE Final    Comment:        The GeneXpert MRSA Assay (FDA approved for NASAL specimens only), is one component of a comprehensive MRSA colonization surveillance program. It is not intended to diagnose MRSA infection nor to guide or monitor treatment for MRSA infections. Performed at [facility], [place] [address] [place], [place] [address]   Culture, Urine     Status: Abnormal   Collection Time: 03/28/20  2:10 PM   Specimen: Urine, Random  Result Value Ref Range Status   Specimen Description URINE, RANDOM  Final   Special Requests   Final    NONE Performed at  [facility], [place] [address], [place], [place] [address]    Culture 30,000 COLONIES/mL YEAST (A)  Final   Report Status 03/29/2020 FINAL  Final  C Difficile Quick Screen (NO PCR Reflex)     Status: None   Collection Time: 03/29/20 10:20 AM   Specimen: STOOL  Result Value Ref Range Status   C Diff antigen NEGATIVE NEGATIVE Final   C Diff toxin NEGATIVE NEGATIVE Final   C Diff interpretation No C. difficile detected.  Final    Comment: Performed at [facility], [place] [address] [place], [place] [address]  Culture, respiratory (non-expectorated)     Status: None (Preliminary result)   Collection Time: 04/01/20  9:24 AM   Specimen: Tracheal Aspirate; Respiratory  Result Value Ref Range Status   Specimen Description TRACHEAL ASPIRATE  Final   Special Requests NONE  Final   Gram Stain   Final    RARE WBC PRESENT, PREDOMINANTLY MONONUCLEAR FEW SQUAMOUS EPITHELIAL CELLS PRESENT RARE GRAM NEGATIVE RODS    Culture   Final    FEW PSEUDOMONAS AERUGINOSA REPEATING SUSCEPTIBILITY Performed at [facility], [place] [address] [place], [place] [address]    Report Status PENDING  Incomplete     Studies: DG Abd 1 View  Result Date: 04/02/2020 CLINICAL DATA:  Ileus EXAM: ABDOMEN - 1 VIEW COMPARISON:  Portable exam 1444 hours compared to 11/20/2019 FINDINGS: Air-filled loops of nondistended bowel in the mid abdomen. No bowel dilatation or bowel wall thickening. G-tube present. Bones demineralized. Patient rotated to the LEFT. Scattered atherosclerotic calcifications in pelvis. IMPRESSION: Nonobstructive bowel gas pattern. Electronically Signed   By: Lavonia Dana M.D.   On: 04/02/2020 15:08   DG Chest Port 1 View  Result Date: 04/04/2020 CLINICAL DATA:  Abnormal respirations.  Tracheostomy tube. EXAM: PORTABLE CHEST 1 VIEW COMPARISON:  04/03/2020, 03/28/2020.  CT 12/21/2019. FINDINGS: Stable prominence of the right upper mediastinum consistent prominent great vessels. Tracheostomy tube  in stable position. Dual lumen central catheter noted with tip over lower right atrium in unchanged position. Heart size stable. Diffuse bilateral pulmonary infiltrates again noted. Left lower lobe atelectasis again noted. Tiny bilateral pleural effusions cannot be excluded. No pneumothorax. IMPRESSION: 1. Tracheostomy tube in stable position.  Dual-lumen central catheter with tip over lower right atrium in unchanged position. 2. Diffuse bilateral pulmonary infiltrates again noted. Left lower lobe atelectasis again noted. Tiny bilateral pleural effusions cannot be excluded. Chest appears unchanged from prior exam. Electronically Signed   By: Marcello Moores  Register   On: 04/04/2020 06:56    Scheduled Meds: . apixaban  2.5 mg Per Tube BID  . chlorhexidine  15 mL Mouth Rinse BID  . chlorhexidine gluconate (MEDLINE KIT)  15 mL Mouth Rinse BID  . Chlorhexidine Gluconate Cloth  6 each Topical Q0600  . darbepoetin (ARANESP) injection - DIALYSIS  100 mcg Intravenous Q Mon-HD  . feeding supplement (PRO-STAT SUGAR FREE 64)  30 mL Per Tube BID  . insulin aspart  0-6 Units Subcutaneous Q4H  . insulin aspart  2 Units Subcutaneous Q4H  . levothyroxine  25 mcg Per Tube Q0600  . lipase/protease/amylase)  20,880 Units Per Tube Once   And  . sodium bicarbonate  650 mg Per Tube Once  . mouth rinse  15 mL Mouth Rinse q12n4p  . metoprolol tartrate  25 mg Per Tube BID  . nutrition supplement (JUVEN)  1 packet Per Tube BID BM  . pantoprazole sodium  40 mg Per Tube Daily  . phenylephrine 200 mcg / ml (NON-ED USE ONLY) injection  100 mcg Intracavernosal Once   Continuous Infusions: . dextrose 5 % and 0.9% NaCl 30 mL/hr at 04/04/20 0407  . feeding supplement (NEPRO CARB STEADY) 1,000 mL (04/04/20 1002)  . magnesium sulfate bolus IVPB 2 g (04/04/20 0953)  . meropenem (MERREM) IV 500 mg (04/03/20 1720)    Principal Problem:   Enterococcal bacteremia Active Problems:   Acute respiratory failure (HCC)   Hemoptysis    Pressure injury of skin   Protein-calorie malnutrition, severe   Decubitus ulcer of sacral region, stage 4 (HCC)   Fever   Palliative care by specialist   Goals of care, counseling/discussion   DNR (do not resuscitate)     Code Status: Full Family Communication: [name] sister 03/29/20, brother [name] on 04/02/20 DVT prophylaxis: Eliquis    Consultants: PCCM Ethics committee/Dr. Andria Frames  Procedures: Echocardiogram 1. Left ventricular ejection fraction, by estimation, is 60 to 65%. The  left ventricle has normal function. The left ventricle has no regional  wall motion abnormalities. There is severe left ventricular hypertrophy.  Left ventricular diastolic parameters  are consistent with Grade I diastolic dysfunction (impaired relaxation).  2. Right ventricular systolic function is normal. The right ventricular  size is normal. There is mildly elevated pulmonary artery systolic  pressure.  3. Left atrial size was mildly dilated.  4. Right atrial size was mildly dilated.  5. The mitral valve is grossly normal. Trivial mitral valve  regurgitation.  6. The aortic valve is tricuspid. Aortic valve regurgitation is trivial.   Antibiotics: Anti-infectives (From admission, onward)   Start     Dose/Rate Route Frequency Ordered Stop   04/01/20 1013  vancomycin (VANCOCIN) 500-5 MG/100ML-% IVPB       Note to Pharmacy: Cherylann Banas   : cabinet override      04/01/20 1013 04/01/20 1253   03/29/20 1552  vancomycin (VANCOCIN) 500-5 MG/100ML-% IVPB       Note to Pharmacy: Anthonette Legato, Melisa   : cabinet override      03/29/20 1552 03/29/20 1556   03/29/20 1415  fluconazole (DIFLUCAN) 40 MG/ML suspension 100 mg        100 mg Oral Daily 03/29/20 1408 03/31/20 1015  03/28/20 2000  meropenem (MERREM) 500 mg in sodium chloride 0.9 % 100 mL IVPB     Discontinue     500 mg 200 mL/hr over 30 Minutes Intravenous Every 24 hours 03/28/20 1623 04/04/20 2359   03/25/20 1800   cefTAZidime (FORTAZ) 1 g in sodium chloride 0.9 % 100 mL IVPB  Status:  Discontinued       "Followed by" Linked Group Details   1 g 200 mL/hr over 30 Minutes Intravenous Every 24 hours 03/18/20 1015 03/28/20 1613   03/22/20 0830  vancomycin (VANCOREADY) IVPB 500 mg/100 mL        500 mg 100 mL/hr over 60 Minutes Intravenous  Once 03/22/20 0827 03/22/20 1014   03/20/20 1200  vancomycin (VANCOCIN) IVPB 500 mg/100 ml premix        500 mg 100 mL/hr over 60 Minutes Intravenous Every M-W-F (Hemodialysis) 03/19/20 0941 04/03/20 1500   03/18/20 1800  meropenem (MERREM) 500 mg in sodium chloride 0.9 % 100 mL IVPB       "Followed by" Linked Group Details   500 mg 200 mL/hr over 30 Minutes Intravenous Every 24 hours 03/18/20 1015 03/25/20 0026   03/18/20 1200  vancomycin (VANCOCIN) IVPB 500 mg/100 ml premix        500 mg 100 mL/hr over 60 Minutes Intravenous Every M-W-F (Hemodialysis) 03/18/20 0938 03/18/20 1700   03/18/20 1200  cefTAZidime (FORTAZ) 2 g in sodium chloride 0.9 % 100 mL IVPB  Status:  Discontinued        2 g 200 mL/hr over 30 Minutes Intravenous Every Mon (Hemodialysis) 03/18/20 0939 03/18/20 1015   03/07/20 1800  cefTAZidime (FORTAZ) 2 g in sodium chloride 0.9 % 100 mL IVPB  Status:  Discontinued        2 g 200 mL/hr over 30 Minutes Intravenous Every T-Th-Sa (1800) 03/06/20 1255 03/18/20 1015   03/07/20 1200  vancomycin (VANCOCIN) IVPB 500 mg/100 ml premix  Status:  Discontinued        500 mg 100 mL/hr over 60 Minutes Intravenous Every T-Th-Sa (Hemodialysis) 03/06/20 1255 03/19/20 0941   03/04/20 2000  cefTAZidime (FORTAZ) 2 g in sodium chloride 0.9 % 100 mL IVPB  Status:  Discontinued        2 g 200 mL/hr over 30 Minutes Intravenous Every M-W-F (2000) 03/01/20 1136 03/06/20 1255   03/02/20 1200  vancomycin (VANCOCIN) IVPB 500 mg/100 ml premix        500 mg 100 mL/hr over 60 Minutes Intravenous Every T-Th-Sa (Hemodialysis) 03/01/20 1131 03/02/20 1710   03/01/20 1230  vancomycin  (VANCOCIN) IVPB 500 mg/100 ml premix  Status:  Discontinued        500 mg 100 mL/hr over 60 Minutes Intravenous Every M-W-F (Hemodialysis) 03/01/20 1131 03/06/20 1255   02/28/20 1110  vancomycin (VANCOREADY) IVPB 500 mg/100 mL        over 60 Minutes  Continuous PRN 02/28/20 1112 02/28/20 1110   02/28/20 1101  vancomycin (VANCOCIN) 1-5 GM/200ML-% IVPB  Status:  Discontinued       Note to Pharmacy: Lytle Butte   : cabinet override      02/28/20 1101 02/28/20 1327   02/27/20 1800  cefTAZidime (FORTAZ) 1 g in sodium chloride 0.9 % 100 mL IVPB        1 g 200 mL/hr over 30 Minutes Intravenous Every 24 hours 02/27/20 0947 03/03/20 1738   02/27/20 1200  vancomycin (VANCOCIN) IVPB 500 mg/100 ml premix        500  mg 100 mL/hr over 60 Minutes Intravenous Once 02/27/20 0829 02/27/20 1343   02/24/20 1800  vancomycin (VANCOREADY) IVPB 500 mg/100 mL        500 mg 100 mL/hr over 60 Minutes Intravenous  Once 02/24/20 1308 02/24/20 1905   02/22/20 2200  metroNIDAZOLE (FLAGYL) tablet 500 mg  Status:  Discontinued        500 mg Per Tube Every 8 hours 02/22/20 0957 03/30/20 1057   02/22/20 1700  cefTRIAXone (ROCEPHIN) 2 g in sodium chloride 0.9 % 100 mL IVPB  Status:  Discontinued        2 g 200 mL/hr over 30 Minutes Intravenous Every 24 hours 02/22/20 0957 02/27/20 0947   02/22/20 1200  vancomycin (VANCOCIN) IVPB 500 mg/100 ml premix  Status:  Discontinued        500 mg 100 mL/hr over 60 Minutes Intravenous Every T-Th-Sa (Hemodialysis) 02/20/20 1253 02/21/20 1040   02/21/20 1431  vancomycin variable dose per unstable renal function (pharmacist dosing)  Status:  Discontinued         Does not apply See admin instructions 02/21/20 1431 03/01/20 1131   02/21/20 1200  vancomycin (VANCOREADY) IVPB 500 mg/100 mL        500 mg 100 mL/hr over 60 Minutes Intravenous  Once 02/21/20 0956 02/21/20 1253   02/21/20 0714  vancomycin (VANCOCIN) 500-5 MG/100ML-% IVPB  Status:  Discontinued       Note to Pharmacy:  Ashley Akin   : cabinet override      02/21/20 0714 02/21/20 1346   02/20/20 1400  vancomycin (VANCOREADY) IVPB 1250 mg/250 mL        1,250 mg 166.7 mL/hr over 90 Minutes Intravenous  Once 02/20/20 1253 02/20/20 1538   02/20/20 1200  levofloxacin (LEVAQUIN) IVPB 500 mg  Status:  Discontinued        500 mg 100 mL/hr over 60 Minutes Intravenous Every 48 hours 02/20/20 1020 02/21/20 0943   02/15/20 1545  vancomycin (VANCOCIN) IVPB 1000 mg/200 mL premix  Status:  Discontinued       "Followed by" Linked Group Details   1,000 mg 200 mL/hr over 60 Minutes Intravenous Every 24 hours 02/14/20 1530 02/14/20 1745   02/15/20 0330  ceFEPIme (MAXIPIME) 2 g in sodium chloride 0.9 % 100 mL IVPB  Status:  Discontinued        2 g 200 mL/hr over 30 Minutes Intravenous Every 12 hours 02/14/20 1531 02/14/20 1531   02/14/20 1531  ceFEPIme (MAXIPIME) 2 g in sodium chloride 0.9 % 100 mL IVPB  Status:  Discontinued        2 g 200 mL/hr over 30 Minutes Intravenous Every 12 hours 02/14/20 1531 02/14/20 1745   02/14/20 1530  ceFEPIme (MAXIPIME) 2 g in sodium chloride 0.9 % 100 mL IVPB  Status:  Discontinued        2 g 200 mL/hr over 30 Minutes Intravenous  Once 02/14/20 1520 02/14/20 1531   02/14/20 1530  vancomycin (VANCOREADY) IVPB 1500 mg/300 mL  Status:  Discontinued       "Followed by" Linked Group Details   1,500 mg 150 mL/hr over 120 Minutes Intravenous  Once 02/14/20 1530 02/14/20 1745        Time spent: 25 minutes    Erin Hearing ANP [facility] Pager [phone]. If 7PM-7AM, please contact night-coverage at [email or web address] 04/04/2020, 10:36 AM  LOS: 50 days

## 2020-04-04 NOTE — Plan of Care (Signed)

## 2020-04-04 NOTE — Progress Notes (Signed)
MEWs is yellow, per prior RN this is not new. Will continue with q 4 hour vital signs check.

## 2020-04-04 NOTE — Progress Notes (Signed)
Occupational Therapy Treatment Patient Details Name: Shane Banks MRN: [id number] DOB: Mar 20, 1946 Today's Date: 04/04/2020    History of present illness 74 yo m with PMHx of afib with RVR, BPH, stroke, and COVID ARDS in January requiring trach and LTACH placement at [facility]. Had been tolerating trach collar, but on 5/19 developed hemoptysis and hypoxemic respiratory failure requiring vent. Transferred to [facility] on 5/19.  5/19 back on vent, 6/7 transitioned to [facility]   OT comments  Continued efforts with maximizing comfort and positioning secondary to increased tone in B UE. Pt with decreased edema noted in B hands after use of pillows for elevation. Provided gentle PROM stretching to B UE at all joints within pt's tolerance. Pt with minimal responses or eye opening during this session. Collaborated with nursing staff on positioning efforts. Plan to follow-up again next week and discharge if positioning efforts successful.    Follow Up Recommendations  SNF;LTACH;Supervision/Assistance - 24 hour    Equipment Recommendations  Other (comment) (TBD)    Recommendations for Other Services      Precautions / Restrictions Precautions Precautions: Fall;Other (comment) Precaution Comments: trach collar, PEG (feedings paused during mobility) Restrictions Weight Bearing Restrictions: No       Mobility Bed Mobility Overal bed mobility: Needs Assistance             General bed mobility comments: not attempted  Transfers                 General transfer comment: not attempted    Balance                                           ADL either performed or assessed with clinical judgement   ADL Overall ADL's : Needs assistance/impaired                                       General ADL Comments: Pt continues with Total A for all ADLs     Vision   Vision Assessment?: Vision impaired- to be further tested in functional context;Yes    Perception     Praxis      Cognition Arousal/Alertness: Lethargic Behavior During Therapy: Flat affect Overall Cognitive Status: Difficult to assess                                 General Comments: minimal engagement with PT, mostly raises eyebrows to respond to questions with the exeception of one affirmative head nod. Pt with many periods of eye closing during session        Exercises Exercises: Other exercises General Exercises - Upper Extremity Shoulder Flexion: PROM;Both;5 reps;Supine Shoulder Horizontal ABduction: PROM;Both;5 reps;Supine Shoulder Horizontal ADduction: PROM;Both;5 reps;Supine Elbow Flexion: PROM;5 reps;Supine Elbow Extension: PROM;Both;5 reps;Supine Wrist Flexion: PROM;Both;5 reps;Supine Wrist Extension: PROM;Both;5 reps;Supine Digit Composite Flexion: PROM;Both;5 reps;Supine Composite Extension: PROM;Both;5 reps;Supine General Exercises - Lower Extremity Heel Slides: PROM;Both;20 reps;Supine Hip ABduction/ADduction: Both;10 reps;Supine;PROM Other Exercises Other Exercises: Hip ER/IR stretch, x10 bilaterally, sustained hold ~5 seconds at end range Other Exercises: PF stretch, 2x30 seconds bilaterally Other Exercises: positioned with pillow under R side gluteal region for posterior pressure relief, bilateral UEs elevated on pillows, HOB elevated >30*   Shoulder Instructions  General Comments HR up to 95bpm with PROM stretching with intermittent grimacing, SPO2 WFL, RR from 12 to 30 during session. Focused on PROM stretching and positioning with pillows to decrease swelling and edema. Notified nursing staff and left reminder sign in pt's room    Pertinent Vitals/ Pain       Pain Assessment: Faces Faces Pain Scale: Hurts even more Pain Location: RLE PROM especially hip flexion Pain Descriptors / Indicators: Grimacing;Discomfort Pain Intervention(s): Limited activity within patient's tolerance;Monitored during  session;Repositioned;Relaxation  Home Living                                          Prior Functioning/Environment              Frequency  Min 1X/week        Progress Toward Goals  OT Goals(current goals can now be found in the care plan section)  Progress towards OT goals: OT to reassess next treatment  Acute Rehab OT Goals Patient Stated Goal: not stated OT Goal Formulation: Patient unable to participate in goal setting Time For Goal Achievement: 04/09/20 Potential to Achieve Goals: [place] Frequency needs to be updated;Discharge plan remains appropriate    Co-evaluation                 AM-PAC OT "6 Clicks" Daily Activity     Outcome Measure   Help from another person eating meals?: Total Help from another person taking care of personal grooming?: Total Help from another person toileting, which includes using toliet, bedpan, or urinal?: Total Help from another person bathing (including washing, rinsing, drying)?: Total Help from another person to put on and taking off regular upper body clothing?: Total Help from another person to put on and taking off regular lower body clothing?: Total 6 Click Score: 6    End of Session Equipment Utilized During Treatment: Oxygen  OT Visit Diagnosis: Other abnormalities of gait and mobility (R26.89);Muscle weakness (generalized) (M62.81);Other symptoms and signs involving cognitive function;Pain   Activity Tolerance Patient limited by fatigue;Patient limited by lethargy   Patient Left in bed;with call bell/phone within reach   Nurse Communication Other (comment) (positioning)        Time: 5621-3086 OT Time Calculation (min): 24 min  Charges: OT General Charges $OT Visit: 1 Visit OT Treatments $Therapeutic Activity: 8-22 mins $Therapeutic Exercise: 8-22 mins  Layla Maw, OTR/L   Layla Maw 04/04/2020, 2:49 PM

## 2020-04-04 NOTE — Progress Notes (Signed)
[facility] NEPHROLOGY PROGRESS NOTE  Assessment/ Plan: Pt is a 74 y.o. yo male  who initially [facility] [name] hospitalization which included mech ventilation and trach placement. At [facility] had severe infections/ bacteremia/ PNA and developed AKI felt due to IV gent toxicity requiring initiation of HD 12/16/19. Admitted to ICU [facility] for trach bleed on 5/19. Was getting MWF HD at [facility] prior to admit here.   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 On TC again. Full code now. CXR had bilat infiltrates, continues on broad-spec IV abx thru 7/8.  Had respiratory distress on 7/6 with copious secretion.  PCCM following.  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   # Sacral osteo/ decub stage IV - as per #2  # ESRD - on HD since mid March 2021. HD MWF at [facility]. S/P new TDC 6/2 by IR.  Status post HD yesterday with 1.1 L UF.  # BP/vol status: Ultrafiltration during dialysis. The high BUN is not pre-renal as he has virtually no renal fxn, suspect hypercatabolic given infections and tissue necrosis/ decub.  # Atrial fib - per primary, on eliquis 2.5 BID  # Anemia ckd - transfuse prn, Hb remains 9- 10 range. on ESA  # Hypercalcemia: w/ low PTH, prob from immobility, improved. Cont low 2.0 Ca++ bath , PTH 29.   # SP peg/ divert colostomy  #Hypokalemia: Dialysis with IV potassium bath.  # Disp: Not going to LTAC because of insurance issues.  Patient is clinically declined with no meaningful recovery.  He is not a candidate for outpatient dialysis.  Prognosis is grim and I think he is suffering more.  Strongly recommend hospice/comfort care.  Discussed with the primary team yesterday.  Seen by palliative care however family not willing to  talk to them again.  May benefit from ethics committee meeting.  Subjective: Seen and examined.  Remains on trach.  Lethargic.  No new event.  Objective Vital signs in last 24 hours: Vitals:   04/04/20 0326 04/04/20 0405 04/04/20 0835 04/04/20 0858  BP:  118/70 134/72   Pulse: 95 95 96 95  Resp: (!) 33 (!) 27 (!) 22 (!) 23  Temp:  98.2 F (36.8 C) 98.3 F (36.8 C)   TempSrc:  Oral Oral   SpO2: 98% 99% 95% 98%  Weight:      Height:       Weight change:   Intake/Output Summary (Last 24 hours) at 04/04/2020 1033 Last data filed at 04/04/2020 1002 Gross per 24 hour  Intake 447.49 ml  Output 1660 ml  Net -1212.51 ml       Labs: Basic Metabolic Panel: Recent Labs  Lab 04/01/20 0435 04/01/20 0435 04/02/20 0807 04/03/20 0636 04/03/20 2033  NA 136   < > 133* 135 134*  K 4.0   < > 3.9 4.2 3.0*  CL 103   < > 97* 100 99  CO2 22   < > _0 GLUCOSE 129*   < > 130* 115* 114*  BUN 118*   < > 74* 89* 34*  CREATININE 3.24*   < > 2.45* 3.06* 1.40*  CALCIUM 9.3   < > 9.1 9.1 8.2*  PHOS 4.0  --   --  5.9* 2.8   < > =  values in this interval not displayed.   Liver Function Tests: Recent Labs  Lab 04/02/20 0807 04/03/20 0636 04/03/20 2033  AST 33  --   --   ALT 23  --   --   ALKPHOS 110  --   --   BILITOT 0.7  --   --   PROT 7.8  --   --   ALBUMIN 2.0* 1.8* 2.5*   No results for input(s): LIPASE, AMYLASE in the last 168 hours. No results for input(s): AMMONIA in the last 168 hours. CBC: Recent Labs  Lab 03/29/20 0446 03/29/20 0446 03/30/20 0347 03/30/20 0347 03/31/20 1026 03/31/20 1026 04/01/20 0435 04/02/20 0807 04/03/20 0636  WBC 16.0*   < > 18.0*   < > 17.2*   < > 18.0* 20.2* 14.9*  NEUTROABS 11.4*  --  12.9*  --  12.0*  --   --   --   --   HGB 9.8*   < > 9.8*   < > 9.7*   < > 9.5* 9.8* 9.4*  HCT 32.0*   < > 32.2*   < > 31.1*   < > 30.9* 31.2* 30.5*  MCV 82.7   < > 82.6  --  81.2  --  82.6 80.8 81.6  PLT 257   < > 225   < > 267   < > 222 248 227   < >  = values in this interval not displayed.   Cardiac Enzymes: No results for input(s): CKTOTAL, CKMB, CKMBINDEX, TROPONINI in the last 168 hours. CBG: Recent Labs  Lab 04/03/20 1506 04/03/20 2005 04/04/20 0030 04/04/20 0404 04/04/20 0831  GLUCAP 109* 111* 116* 83 86    Iron Studies: No results for input(s): IRON, TIBC, TRANSFERRIN, FERRITIN in the last 72 hours. Studies/Results: DG Abd 1 View  Result Date: 04/02/2020 CLINICAL DATA:  Ileus EXAM: ABDOMEN - 1 VIEW COMPARISON:  Portable exam 1444 hours compared to 11/20/2019 FINDINGS: Air-filled loops of nondistended bowel in the mid abdomen. No bowel dilatation or bowel wall thickening. G-tube present. Bones demineralized. Patient rotated to the LEFT. Scattered atherosclerotic calcifications in pelvis. IMPRESSION: Nonobstructive bowel gas pattern. Electronically Signed   By: Lavonia Dana M.D.   On: 04/02/2020 15:08   DG Chest Port 1 View  Result Date: 04/04/2020 CLINICAL DATA:  Abnormal respirations.  Tracheostomy tube. EXAM: PORTABLE CHEST 1 VIEW COMPARISON:  04/03/2020, 03/28/2020.  CT 12/21/2019. FINDINGS: Stable prominence of the right upper mediastinum consistent prominent great vessels. Tracheostomy tube in stable position. Dual lumen central catheter noted with tip over lower right atrium in unchanged position. Heart size stable. Diffuse bilateral pulmonary infiltrates again noted. Left lower lobe atelectasis again noted. Tiny bilateral pleural effusions cannot be excluded. No pneumothorax. IMPRESSION: 1. Tracheostomy tube in stable position. Dual-lumen central catheter with tip over lower right atrium in unchanged position. 2. Diffuse bilateral pulmonary infiltrates again noted. Left lower lobe atelectasis again noted. Tiny bilateral pleural effusions cannot be excluded. Chest appears unchanged from prior exam. Electronically Signed   By: [place]   On: 04/04/2020 06:56    Medications: Infusions: . dextrose 5 % and 0.9% NaCl 30  mL/hr at 04/04/20 0407  . feeding supplement (NEPRO CARB STEADY) 1,000 mL (04/04/20 1002)  . magnesium sulfate bolus IVPB 2 g (04/04/20 0953)  . meropenem (MERREM) IV 500 mg (04/03/20 1720)    Scheduled Medications: . apixaban  2.5 mg Per Tube BID  . chlorhexidine  15 mL Mouth Rinse BID  .  chlorhexidine gluconate (MEDLINE KIT)  15 mL Mouth Rinse BID  . Chlorhexidine Gluconate Cloth  6 each Topical Q0600  . darbepoetin (ARANESP) injection - DIALYSIS  100 mcg Intravenous Q Mon-HD  . feeding supplement (PRO-STAT SUGAR FREE 64)  30 mL Per Tube BID  . insulin aspart  0-6 Units Subcutaneous Q4H  . insulin aspart  2 Units Subcutaneous Q4H  . levothyroxine  25 mcg Per Tube Q0600  . lipase/protease/amylase)  20,880 Units Per Tube Once   And  . sodium bicarbonate  650 mg Per Tube Once  . mouth rinse  15 mL Mouth Rinse q12n4p  . metoprolol tartrate  25 mg Per Tube BID  . nutrition supplement (JUVEN)  1 packet Per Tube BID BM  . pantoprazole sodium  40 mg Per Tube Daily  . phenylephrine 200 mcg / ml (NON-ED USE ONLY) injection  100 mcg Intracavernosal Once    have reviewed scheduled and prn medications.  Physical Exam: General: Ill-looking male with a tracheostomy, lethargic and not following commands. Heart:RRR, s1s2 nl Lungs: Bibasal coarse breath sound, no wheezing Abdomen:soft, colostomy bag noted.  Also has feeding tube. Extremities:No edema Dialysis Access: Right IJ TDC.  Massie Mees Prasad Audree Schrecengost 04/04/2020,10:33 AM  LOS: 50 days  Pager: [phone]

## 2020-04-05 LAB — RENAL FUNCTION PANEL
Albumin: 2.1 g/dL — ABNORMAL LOW (ref 3.5–5.0)
Anion gap: 7 (ref 5–15)
BUN: 58 mg/dL — ABNORMAL HIGH (ref 8–23)
CO2: 27 mmol/L (ref 22–32)
Calcium: 9 mg/dL (ref 8.9–10.3)
Chloride: 101 mmol/L (ref 98–111)
Creatinine, Ser: 2.64 mg/dL — ABNORMAL HIGH (ref 0.61–1.24)
GFR calc Af Amer: 27 mL/min — ABNORMAL LOW (ref >60)
GFR calc non Af Amer: 23 mL/min — ABNORMAL LOW (ref >60)
Glucose, Bld: 150 mg/dL — ABNORMAL HIGH (ref 70–99)
Phosphorus: 4.9 mg/dL — ABNORMAL HIGH (ref 2.5–4.6)
Potassium: 3.9 mmol/L (ref 3.5–5.1)
Sodium: 135 mmol/L (ref 135–145)

## 2020-04-05 LAB — CULTURE, RESPIRATORY W GRAM STAIN

## 2020-04-05 LAB — CBC
HCT: 28.6 % — ABNORMAL LOW (ref 39.0–52.0)
Hemoglobin: 8.8 g/dL — ABNORMAL LOW (ref 13.0–17.0)
MCH: 25.5 pg — ABNORMAL LOW (ref 26.0–34.0)
MCHC: 30.8 g/dL (ref 30.0–36.0)
MCV: 82.9 fL (ref 80.0–100.0)
Platelets: 264 10*3/uL (ref 150–400)
RBC: 3.45 MIL/uL — ABNORMAL LOW (ref 4.22–5.81)
RDW: 18.3 % — ABNORMAL HIGH (ref 11.5–15.5)
WBC: 12.9 10*3/uL — ABNORMAL HIGH (ref 4.0–10.5)
nRBC: 0 % (ref 0.0–0.2)

## 2020-04-05 LAB — GLUCOSE, CAPILLARY
Glucose-Capillary: 112 mg/dL — ABNORMAL HIGH (ref 70–99)
Glucose-Capillary: 119 mg/dL — ABNORMAL HIGH (ref 70–99)
Glucose-Capillary: 133 mg/dL — ABNORMAL HIGH (ref 70–99)
Glucose-Capillary: 141 mg/dL — ABNORMAL HIGH (ref 70–99)
Glucose-Capillary: 150 mg/dL — ABNORMAL HIGH (ref 70–99)
Glucose-Capillary: 151 mg/dL — ABNORMAL HIGH (ref 70–99)

## 2020-04-05 MED ORDER — HEPARIN SODIUM (PORCINE) 1000 UNIT/ML IJ SOLN
INTRAMUSCULAR | Status: AC
  Administered 2020-04-05: 3800 [IU]
  Filled 2020-04-05: qty 4

## 2020-04-05 MED ORDER — MIDODRINE HCL 5 MG PO TABS
ORAL_TABLET | ORAL | Status: AC
  Administered 2020-04-05: 10 mg
  Filled 2020-04-05: qty 2

## 2020-04-05 NOTE — Progress Notes (Signed)
RT came by to assess patient and do chest vest, but patient was off the floor in dialysis. RT will come back when patient returns to the floor.

## 2020-04-05 NOTE — Progress Notes (Addendum)
[facility] PROGRESS NOTE  Shane Banks XTG:[id number] DOB: 12-02-1945 DOA: 02/14/2020 PCP: [name], MD  Status: Inpatient--Remains inpatient appropriate because :Unsafe d/c plan, IV treatments appropriate due to intensity of illness or inability to take PO and Inpatient level of care appropriate due to severity of illness-patient continues to have tenuous respiratory status   Dispo:  Patient From:  Home  Planned Disposition: [place]  Expected discharge date: 04/01/20  Medically stable for discharge: No-needs to transition to cuffless trach (respiratory status too tenuous to accomplish), needs to be able to sit upright in recliner to pursue dialysis in the outpatient setting (profoundly physically deconditioned and unable to even lift arms at this juncture), -remains on IV antibiotics secondary to sacral osteomyelitis and decubitus and recent bacteremia with last dose due on 7/8  **Spoke with [name] on 7/8.  After extensive update on patient's lack of progress and recommendation from nephrology that patient is no longer candidate for outpatient hemodialysis family willing to meet with ethics physician Dr. Andria Frames next week-they have been given a list of available time slots for 7/13 and will notify nursing staff of time requested so Dr. Andria Frames can be notified**  HPI: 74 y.o. male past medical history significant of chronic respiratory failure status post tracheostomy, end-stage renal disease on dialysis, chronic atrial fibrillation nonhemorrhagic stroke. Patient treated for Covid pneumonia January 2021 and had been on the vent because of progressive ARDS.  Because of lack of progression he underwent tracheostomy and [facility].  Was sent back to this facility in May due to trach site bleeding suspected hemoptysis.  He was also noted with sacral decubitus at time of presentation in May.  Since admission he has been treated for  multidrug-resistant Pseudomonas pneumonia and MRSA and enterococcal bacteremia.  On 03/15/2020 patient returned to the ICU for ventilatory and pressor support he developed septic shock and developed acute renal failure requiring dialysis.  He developed sepsis physiology likely secondary to sacral decubitus ulcer with underlying osteomyelitis.    He has been weaned from pressors. PCCM is following weekly for trach weaning with the assistance of SLP.  Patient was liberated from the vent on 03/21/2020 and was transferred to the floor in 03/22/2020.  Due to the lack of insurance approval he cannot go [facility] to [facility].  Social worker is working on Clinical research associate.  He will need to be transition to a cuffless trach and be able to sit upright in a recliner for hemodialysis before he can discharge to skilled nursing facility.  On the morning of 7/6 patient developed hypoxemia and increased work of breathing with shallow respiratory effort, requiring frequent suctioning.  O2 sats decreased into the 70s but improved to 95% with bagging and suctioning.  Briefly required increase of FiO2 to 100% but was eventually decreased back to 40%.  Tracheal aspirate appeared consistent with tube feedings therefore tube feedings were stopped.  Subjective: Examined in hemodialysis Awake. When questioned had no specific complaints-does attempt to phonate when spoken to  Objective: Vitals:   04/05/20 1015 04/05/20 1030  BP: (!) 87/40 (!) 88/56  Pulse: (!) 102 (!) 101  Resp: (!) 38 (!) 29  Temp:    SpO2: 96% 94%    Intake/Output Summary (Last 24 hours) at 04/05/2020 1041 Last data filed at 04/05/2020 0500 Gross per 24 hour  Intake 2094.84 ml  Output 350 ml  Net 1744.84 ml   Filed Weights   04/03/20 0930 04/03/20 1400  04/05/20 0835  Weight: 65.1 kg 64 kg 72 kg    Exam:  Constitutional: NAD, calm but lethargic, comfortable Neck: normal, supple, no masses-midline cuffed trach in place  Respiratory: Has  just completed CPT treatment.  Bilateral lung sounds with coarse rhonchi on expiration.  Tachypneic but without significant increased work of breathing, trach collar oxygen/FiO2 35% Cardiovascular: Regular rate and rhythm, no murmurs / rubs / gallops. Not in upper extremities is improving. 2+ pedal pulses. No carotid bruits.  Dialysis catheter right subclavian vein.  D5NS IV fluid at 30 cc/h. Abdomen: no tenderness, no masses palpated. No hepatosplenomegaly. Bowel sounds positive.  Colostomy in place with light brown loose stool.  Gastrostomy tube clamped GU: Condom catheter in place draining yellow-colored urine to bedside bag Musculoskeletal: no clubbing / cyanosis. No joint deformity upper and lower extremities.  Not spontaneously moving UEs but with PROM has adequate range of motion. Upon closer examination today was noted to have an extension contracture involving the right wrist. Diminished muscle tone throughout all extremities noting too weak to follow commands with upper extremities and unable to lift any of his extremities upon command secondary to generalized weakness. Skin: no rashes, lesions, sacral decubitus not examined today Neurologic: CN 2-12 grossly intact. Sensation intact, DTR not assessed-to assess strength adequately given patient's inability to participate but he was unable to lift either arm off the bed for assessment and also was unable to comply with request to hold up the finger on either hand. 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  PCCM following periodically to assess readiness for transition cuffless trach. History of multidrug-resistant Pseudomonas and follow-up  chest x-ray revealed diffuse left-sided airspace disease with patchy right airspace disease On 7/6 respiratory decompensation as noted above.  Aspiration at this juncture appears to be contributory factor.  PCCM has reevaluated patient and since patient is full code if he continues to worsen he will need mechanical ventilatory support.  Of note, family refusing further conversations with palliative medicine team.  Contacted ethics team 7/9 and tentative meeting with family planned for Tuesday 7/13 between the hours of 130 and 3pm. Family updated on timeframe and will give return call with time when all family members can attend. Has completed 8 doses of IV meropenem as of 7/9. WBC down to 12.9.  Sputum culture has finalized with few Pseudomonas multidrug resistant including to imipenem/Carbapenem's but was sensitive to gentamicin.  Patient is clinically improving without any fever, leukocytosis has trended downward and patient has been weaned back to 35% FiO2.  We will continue to monitor for evolving symptoms and if develops worsening status will need to discuss with ID noting the only sensitive medication is gentamicin. Unfortunately given recurrent pneumonia and increased secretions with associated generalized weakness/weak cough it is highly doubtful he can transition to cuffless trach at this point.  Overall prognosis is poor.  Continue chest physiotherapy with vibra vest administered per RT  Diarrhea Likely noninfectious in etiology-C. difficile negative Suspect related to antibiotics and other medications bolus tube feeding Continues to have watery stools per colostomy Continue IV fluids as above  Mild funguria Urine culture with 30,000 colonies of yeast Continue Diflucan-had started last week but fell off medication list and was resumed on 7/8  Sacral osteomyelitis and HD catheter  sepsis/  Enterococcal bacteremia: Tunneled catheter removal on 02/21/2020. Has tunneled HD catheter right  subclavian-side unremarkable Will require 6 weeks of IV antibiotics per ID (vancomycin and Flagyl.) Currently has PIV in place for antibiotics-last dose 7/8 SBP in the 80s during dialysis today  Paroxysmal atrial fibrillation/Acquired thrombophilia: Currently rate controlled on metoprolol Anticoagulation was discontinued due to suspected GI bleed.  End-stage renal disease: On HD since March 2021 status post tunneled catheter placement 02/28/2020 and as of 7/1 site unremarkable. Dialysis days: MWF SNF reports patient will need to be able to sit upright in recliner to receive hemodialysis before they will accept him as a patient Unfortunately, this patient continues to decline, he has significant weakness and is unable to tolerate sitting up in a recliner receive outpatient hemodialysis.  Attending nephrologist has documented that patient is not a candidate for outpatient hemodialysis  Chronic normocytic anemia: Due to anemia of chronic renal disease Status post1 unit of PRBCs Hgb on 7/9 was 8.8 Continue erythropoietin and IV iron per renal.  History of nonhemorrhagic CVA: CT of the head showed atrophy and chronic microvascular disease. Not on anticoagulation due to possible GI bleed.  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 Documented as full-thickness tissue loss  Severe protein caloric malnutrition: Tube feedings on hold due to concerns of a possible aspiration as above.  Patient is dialysis patient but will allow D5 normal saline at 30 cc/h to replace tube feeding volume loss Will allow medications per tube with small amounts of flush Nutrition Status: Nutrition Problem: Severe Malnutrition Etiology: chronic illness (chronic respiratory failure related to COVID ARDS) Signs/Symptoms: severe muscle depletion, severe fat depletion Interventions: Prostat, Tube feeding, Juven-plan to resume tube feeding slowly beginning at 10 cc/h  and increasing fill out goal rate.  Also plan to check every 4 hour gastric residuals-as of 7/9 tube feedings at 50 cc/h with gastric residuals less than 10 cc when checked      Data Reviewed: Basic Metabolic Panel: Recent Labs  Lab 03/31/20 0625 03/31/20 0625 04/01/20 0435 04/02/20 0807 04/03/20 0636 04/03/20 2033 04/05/20 0506  NA 137   < > 136 133* 135 134* 135  K 4.6   < > 4.0 3.9 4.2 3.0* 3.9  CL 103   < > 103 97* 100 99 101  CO2 21*   < > _0 GLUCOSE 115*   < > 129* 130* 115* 114* 150*  BUN 79*   < > 118* 74* 89* 34* 58*  CREATININE 2.74*   < > 3.24* 2.45* 3.06* 1.40* 2.64*  CALCIUM 9.1   < > 9.3 9.1 9.1 8.2* 9.0  MG  --   --   --   --   --  1.5*  --   PHOS 3.4  --  4.0  --  5.9* 2.8 4.9*   < > = values in this interval not displayed.   Liver Function Tests: Recent Labs  Lab 04/01/20 0435 04/02/20 0807 04/03/20 0636 04/03/20 2033 04/05/20 0506  AST  --  33  --   --   --   ALT  --  23  --   --   --   ALKPHOS  --  110  --   --   --   BILITOT  --  0.7  --   --   --   PROT  --  7.8  --   --   --   ALBUMIN 1.9*  2.0* 1.8* 2.5* 2.1*   No results for input(s): LIPASE, AMYLASE in the last 168 hours. No results for input(s): AMMONIA in the last 168 hours. CBC: Recent Labs  Lab 03/30/20 0347 03/30/20 0347 03/31/20 1026 04/01/20 0435 04/02/20 0807 04/03/20 0636 04/05/20 0506  WBC 18.0*   < > 17.2* 18.0* 20.2* 14.9* 12.9*  NEUTROABS 12.9*  --  12.0*  --   --   --   --   HGB 9.8*   < > 9.7* 9.5* 9.8* 9.4* 8.8*  HCT 32.2*   < > 31.1* 30.9* 31.2* 30.5* 28.6*  MCV 82.6   < > 81.2 82.6 80.8 81.6 82.9  PLT 225   < > 267 222 248 227 264   < > = values in this interval not displayed.   Cardiac Enzymes: No results for input(s): CKTOTAL, CKMB, CKMBINDEX, TROPONINI in the last 168 hours. BNP (last 3 results) No results for input(s): BNP in the last 8760 hours.  ProBNP (last 3 results) No results for input(s): PROBNP in the last 8760  hours.  CBG: Recent Labs  Lab 04/04/20 1536 04/04/20 1920 04/04/20 2307 04/05/20 0415 04/05/20 0730  GLUCAP 127* 139* 122* 141* 112*    Recent Results (from the past 240 hour(s))  MRSA PCR Screening     Status: None   Collection Time: 03/27/20  7:59 PM   Specimen: Nasal Mucosa; Nasopharyngeal  Result Value Ref Range Status   MRSA by PCR NEGATIVE NEGATIVE Final    Comment:        The GeneXpert MRSA Assay (FDA approved for NASAL specimens only), is one component of a comprehensive MRSA colonization surveillance program. It is not intended to diagnose MRSA infection nor to guide or monitor treatment for MRSA infections. Performed at [facility], [place] [address] [place], [place] [address]   Culture, Urine     Status: Abnormal   Collection Time: 03/28/20  2:10 PM   Specimen: Urine, Random  Result Value Ref Range Status   Specimen Description URINE, RANDOM  Final   Special Requests   Final    NONE Performed at [facility], [place] [address], [place], [place] [address]    Culture 30,000 COLONIES/mL YEAST (A)  Final   Report Status 03/29/2020 FINAL  Final  C Difficile Quick Screen (NO PCR Reflex)     Status: None   Collection Time: 03/29/20 10:20 AM   Specimen: STOOL  Result Value Ref Range Status   C Diff antigen NEGATIVE NEGATIVE Final   C Diff toxin NEGATIVE NEGATIVE Final   C Diff interpretation No C. difficile detected.  Final    Comment: Performed at [facility], [place] [address] [place], [place] [address]  Culture, respiratory (non-expectorated)     Status: None   Collection Time: 04/01/20  9:24 AM   Specimen: Tracheal Aspirate; Respiratory  Result Value Ref Range Status   Specimen Description TRACHEAL ASPIRATE  Final   Special Requests NONE  Final   Gram Stain   Final    RARE WBC PRESENT, PREDOMINANTLY MONONUCLEAR FEW SQUAMOUS EPITHELIAL CELLS PRESENT RARE GRAM NEGATIVE RODS    Culture   Final    FEW PSEUDOMONAS AERUGINOSA MULTI-DRUG  RESISTANT ORGANISM RESULT CALLED TO, READ BACK BY AND VERIFIED WITH: IP N COOLEY [phone] MLM Performed at [facility], [place] [address] [place], [place] [address]    Report Status 04/05/2020 FINAL  Final   Organism ID, Bacteria PSEUDOMONAS AERUGINOSA  Final      Susceptibility   Pseudomonas aeruginosa - MIC*    CEFTAZIDIME >=64 RESISTANT Resistant     CIPROFLOXACIN >=4 RESISTANT Resistant     GENTAMICIN 4 SENSITIVE Sensitive     IMIPENEM >=16 RESISTANT Resistant     * FEW PSEUDOMONAS AERUGINOSA     Studies: DG Chest Port 1 View  Result Date: 04/04/2020 CLINICAL DATA:  Abnormal respirations.  Tracheostomy tube. EXAM: PORTABLE CHEST 1 VIEW COMPARISON:  04/03/2020, 03/28/2020.  CT 12/21/2019. FINDINGS: Stable prominence of the right upper mediastinum consistent prominent great vessels. Tracheostomy tube in stable position. Dual lumen central catheter noted with tip over lower right atrium in unchanged position. Heart size stable. Diffuse bilateral pulmonary infiltrates again noted. Left lower lobe atelectasis again noted. Tiny bilateral pleural effusions cannot be excluded. No pneumothorax. IMPRESSION: 1. Tracheostomy tube in stable position. Dual-lumen central catheter with tip over lower right atrium in unchanged position. 2. Diffuse bilateral pulmonary infiltrates again noted. Left lower lobe atelectasis again noted. Tiny bilateral pleural effusions cannot be excluded. Chest appears unchanged from prior exam. Electronically Signed   By: Marcello Moores  Register   On: 04/04/2020 06:56    Scheduled Meds: . apixaban  2.5 mg Per Tube BID  . chlorhexidine  15 mL Mouth Rinse BID  . chlorhexidine gluconate (MEDLINE KIT)  15 mL Mouth Rinse BID  . Chlorhexidine Gluconate Cloth  6 each Topical Q0600  . darbepoetin (ARANESP) injection - DIALYSIS  100 mcg Intravenous Q Mon-HD  . feeding supplement (PRO-STAT SUGAR FREE 64)  30 mL Per Tube BID  . fluconazole  100 mg Per Tube Daily  . insulin aspart   0-6 Units Subcutaneous Q4H  . insulin aspart  2 Units Subcutaneous Q4H  . levothyroxine  25 mcg Per Tube Q0600  . lipase/protease/amylase)  20,880 Units Per Tube Once   And  . sodium bicarbonate  650 mg Per Tube Once  . mouth rinse  15 mL Mouth Rinse q12n4p  . metoprolol tartrate  25 mg Per Tube BID  . nutrition supplement (JUVEN)  1 packet Per Tube BID BM  . pantoprazole sodium  40 mg Per Tube Daily  . phenylephrine 200 mcg / ml (NON-ED USE ONLY) injection  100 mcg Intracavernosal Once   Continuous Infusions: . dextrose 5 % and 0.9% NaCl 30 mL/hr at 04/05/20 0819  . feeding supplement (NEPRO CARB STEADY) 1,000 mL (04/05/20 0819)    Principal Problem:   Enterococcal bacteremia Active Problems:   Acute respiratory failure (HCC)   Hemoptysis   Pressure injury of skin   Protein-calorie malnutrition, severe   Decubitus ulcer of sacral region, stage 4 (HCC)   Fever   Palliative care by specialist   Goals of care, counseling/discussion   DNR (do not resuscitate)     Code Status: Full Family Communication: [name] sister 03/29/20, brother [name] on 04/02/20 DVT prophylaxis: Eliquis    Consultants: PCCM Ethics committee/Dr. Andria Frames  Procedures: Echocardiogram 1. Left ventricular ejection fraction, by estimation, is 60 to 65%. The  left ventricle has normal function. The left ventricle has no regional  wall motion abnormalities. There is severe left ventricular hypertrophy.  Left ventricular diastolic parameters  are consistent with Grade I diastolic dysfunction (impaired relaxation).  2. Right ventricular systolic function is normal. The right ventricular  size is normal. There is mildly elevated pulmonary artery systolic  pressure.  3. Left atrial size was mildly dilated.  4. Right atrial size was mildly dilated.  5. The  mitral valve is grossly normal. Trivial mitral valve  regurgitation.  6. The aortic valve is tricuspid. Aortic valve regurgitation is  trivial.   Antibiotics: Anti-infectives (From admission, onward)   Start     Dose/Rate Route Frequency Ordered Stop   04/05/20 1000  fluconazole (DIFLUCAN) 40 MG/ML suspension 100 mg     Discontinue    "Followed by" Linked Group Details   100 mg Per Tube Daily 04/04/20 2203     04/04/20 2300  fluconazole (DIFLUCAN) 40 MG/ML suspension 200 mg       "Followed by" Linked Group Details   200 mg Per Tube  Once 04/04/20 2203 04/04/20 2325   04/01/20 1013  vancomycin (VANCOCIN) 500-5 MG/100ML-% IVPB       Note to Pharmacy: Cherylann Banas   : cabinet override      04/01/20 1013 04/01/20 1253   03/29/20 1552  vancomycin (VANCOCIN) 500-5 MG/100ML-% IVPB       Note to Pharmacy: Anthonette Legato, Melisa   : cabinet override      03/29/20 1552 03/29/20 1556   03/29/20 1415  fluconazole (DIFLUCAN) 40 MG/ML suspension 100 mg        100 mg Oral Daily 03/29/20 1408 03/31/20 1015   03/28/20 2000  meropenem (MERREM) 500 mg in sodium chloride 0.9 % 100 mL IVPB        500 mg 200 mL/hr over 30 Minutes Intravenous Every 24 hours 03/28/20 1623 04/04/20 2201   03/25/20 1800  cefTAZidime (FORTAZ) 1 g in sodium chloride 0.9 % 100 mL IVPB  Status:  Discontinued       "Followed by" Linked Group Details   1 g 200 mL/hr over 30 Minutes Intravenous Every 24 hours 03/18/20 1015 03/28/20 1613   03/22/20 0830  vancomycin (VANCOREADY) IVPB 500 mg/100 mL        500 mg 100 mL/hr over 60 Minutes Intravenous  Once 03/22/20 0827 03/22/20 1014   03/20/20 1200  vancomycin (VANCOCIN) IVPB 500 mg/100 ml premix        500 mg 100 mL/hr over 60 Minutes Intravenous Every M-W-F (Hemodialysis) 03/19/20 0941 04/03/20 1500   03/18/20 1800  meropenem (MERREM) 500 mg in sodium chloride 0.9 % 100 mL IVPB       "Followed by" Linked Group Details   500 mg 200 mL/hr over 30 Minutes Intravenous Every 24 hours 03/18/20 1015 03/25/20 0026   03/18/20 1200  vancomycin (VANCOCIN) IVPB 500 mg/100 ml premix        500 mg 100 mL/hr over 60 Minutes  Intravenous Every M-W-F (Hemodialysis) 03/18/20 0938 03/18/20 1700   03/18/20 1200  cefTAZidime (FORTAZ) 2 g in sodium chloride 0.9 % 100 mL IVPB  Status:  Discontinued        2 g 200 mL/hr over 30 Minutes Intravenous Every Mon (Hemodialysis) 03/18/20 0939 03/18/20 1015   03/07/20 1800  cefTAZidime (FORTAZ) 2 g in sodium chloride 0.9 % 100 mL IVPB  Status:  Discontinued        2 g 200 mL/hr over 30 Minutes Intravenous Every T-Th-Sa (1800) 03/06/20 1255 03/18/20 1015   03/07/20 1200  vancomycin (VANCOCIN) IVPB 500 mg/100 ml premix  Status:  Discontinued        500 mg 100 mL/hr over 60 Minutes Intravenous Every T-Th-Sa (Hemodialysis) 03/06/20 1255 03/19/20 0941   03/04/20 2000  cefTAZidime (FORTAZ) 2 g in sodium chloride 0.9 % 100 mL IVPB  Status:  Discontinued        2 g 200 mL/hr  over 30 Minutes Intravenous Every M-W-F (2000) 03/01/20 1136 03/06/20 1255   03/02/20 1200  vancomycin (VANCOCIN) IVPB 500 mg/100 ml premix        500 mg 100 mL/hr over 60 Minutes Intravenous Every T-Th-Sa (Hemodialysis) 03/01/20 1131 03/02/20 1710   03/01/20 1230  vancomycin (VANCOCIN) IVPB 500 mg/100 ml premix  Status:  Discontinued        500 mg 100 mL/hr over 60 Minutes Intravenous Every M-W-F (Hemodialysis) 03/01/20 1131 03/06/20 1255   02/28/20 1110  vancomycin (VANCOREADY) IVPB 500 mg/100 mL        over 60 Minutes  Continuous PRN 02/28/20 1112 02/28/20 1110   02/28/20 1101  vancomycin (VANCOCIN) 1-5 GM/200ML-% IVPB  Status:  Discontinued       Note to Pharmacy: Lytle Butte   : cabinet override      02/28/20 1101 02/28/20 1327   02/27/20 1800  cefTAZidime (FORTAZ) 1 g in sodium chloride 0.9 % 100 mL IVPB        1 g 200 mL/hr over 30 Minutes Intravenous Every 24 hours 02/27/20 0947 03/03/20 1738   02/27/20 1200  vancomycin (VANCOCIN) IVPB 500 mg/100 ml premix        500 mg 100 mL/hr over 60 Minutes Intravenous Once 02/27/20 0829 02/27/20 1343   02/24/20 1800  vancomycin (VANCOREADY) IVPB 500 mg/100 mL         500 mg 100 mL/hr over 60 Minutes Intravenous  Once 02/24/20 1308 02/24/20 1905   02/22/20 2200  metroNIDAZOLE (FLAGYL) tablet 500 mg  Status:  Discontinued        500 mg Per Tube Every 8 hours 02/22/20 0957 03/30/20 1057   02/22/20 1700  cefTRIAXone (ROCEPHIN) 2 g in sodium chloride 0.9 % 100 mL IVPB  Status:  Discontinued        2 g 200 mL/hr over 30 Minutes Intravenous Every 24 hours 02/22/20 0957 02/27/20 0947   02/22/20 1200  vancomycin (VANCOCIN) IVPB 500 mg/100 ml premix  Status:  Discontinued        500 mg 100 mL/hr over 60 Minutes Intravenous Every T-Th-Sa (Hemodialysis) 02/20/20 1253 02/21/20 1040   02/21/20 1431  vancomycin variable dose per unstable renal function (pharmacist dosing)  Status:  Discontinued         Does not apply See admin instructions 02/21/20 1431 03/01/20 1131   02/21/20 1200  vancomycin (VANCOREADY) IVPB 500 mg/100 mL        500 mg 100 mL/hr over 60 Minutes Intravenous  Once 02/21/20 0956 02/21/20 1253   02/21/20 0714  vancomycin (VANCOCIN) 500-5 MG/100ML-% IVPB  Status:  Discontinued       Note to Pharmacy: Ashley Akin   : cabinet override      02/21/20 0714 02/21/20 1346   02/20/20 1400  vancomycin (VANCOREADY) IVPB 1250 mg/250 mL        1,250 mg 166.7 mL/hr over 90 Minutes Intravenous  Once 02/20/20 1253 02/20/20 1538   02/20/20 1200  levofloxacin (LEVAQUIN) IVPB 500 mg  Status:  Discontinued        500 mg 100 mL/hr over 60 Minutes Intravenous Every 48 hours 02/20/20 1020 02/21/20 0943   02/15/20 1545  vancomycin (VANCOCIN) IVPB 1000 mg/200 mL premix  Status:  Discontinued       "Followed by" Linked Group Details   1,000 mg 200 mL/hr over 60 Minutes Intravenous Every 24 hours 02/14/20 1530 02/14/20 1745   02/15/20 0330  ceFEPIme (MAXIPIME) 2 g in sodium chloride 0.9 %  100 mL IVPB  Status:  Discontinued        2 g 200 mL/hr over 30 Minutes Intravenous Every 12 hours 02/14/20 1531 02/14/20 1531   02/14/20 1531  ceFEPIme (MAXIPIME) 2 g in sodium  chloride 0.9 % 100 mL IVPB  Status:  Discontinued        2 g 200 mL/hr over 30 Minutes Intravenous Every 12 hours 02/14/20 1531 02/14/20 1745   02/14/20 1530  ceFEPIme (MAXIPIME) 2 g in sodium chloride 0.9 % 100 mL IVPB  Status:  Discontinued        2 g 200 mL/hr over 30 Minutes Intravenous  Once 02/14/20 1520 02/14/20 1531   02/14/20 1530  vancomycin (VANCOREADY) IVPB 1500 mg/300 mL  Status:  Discontinued       "Followed by" Linked Group Details   1,500 mg 150 mL/hr over 120 Minutes Intravenous  Once 02/14/20 1530 02/14/20 1745        Time spent: 25 minutes    Erin Hearing ANP [facility] Pager [phone]. If 7PM-7AM, please contact night-coverage at [email or web address] 04/05/2020, 10:41 AM  LOS: 51 days

## 2020-04-05 NOTE — Progress Notes (Signed)
RT unable to assess patient and perform chest vest due to patient off floor.

## 2020-04-05 NOTE — Progress Notes (Signed)
MEWS =4.  Patient has intermittent issues with tachypnea and tachycardia which are chronic in a waxing and waning pattern.  Patient examined in hemodialysis.  Was awake and nodding appropriately to questions asked.  Was not in any distress and was at baseline status as previously documented in rounding notes.  Full progress note to follow.

## 2020-04-05 NOTE — Progress Notes (Addendum)
[facility] Progress Note  Subjective: not responding to commands or voice today  Vitals:   04/05/20 0734 04/05/20 0835 04/05/20 0840 04/05/20 0900  BP: 126/67 123/66 119/63 95/63  Pulse: 97 94 96 97  Resp: 18 (!) 43 (!) 29 (!) 26  Temp: 98.1 F (36.7 C) 97.9 F (36.6 C)    TempSrc: Axillary Oral    SpO2: 96% 94% 99% 99%  Weight:  72 kg    Height:        Exam: General: Ill-looking male with a tracheostomy, lethargic and not following commands. Heart:RRR, s1s2 nl Lungs: Bibasal coarse breath sound, no wheezing Abdomen:soft, colostomy bag noted.  Also has feeding tube. Extremities:No edema Dialysis Access: Right IJ TDC.   Summary: Pt is a 74 y.o. yo male  who initially came from [facility] to [facility] after [place] hospitalization which included mech ventilation and trach placement. At [facility] had severe infections/ bacteremia/ PNA and developed AKI felt due to IV gent toxicity requiring initiation of HD 12/16/19. Admitted to ICU [facility] for trach bleed on 5/19. Was getting MWF HD at [facility] prior to admit here  Assessment/ Plan: 1. Acute on chronic hypoxemic respiratory failure sp trach/  Tracheostomy/ prolonged mechanical ventilation/ hx of multi drug-resistant Pseudomonas/ recurrent HCAP: Low-grade fever of 100.6, with downtrending WBC 12.9, if another spike of temp, will consider treating with gentamicin per discussion with ID. Patient has completed 8 doses of IV meropenem as of 7/9 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 Monitor closely 3. ESRD - on HD since mid March 2021. HD MWF at [facility] and MWF here. S/P new TDC 6/2 by IR.  Does not need daily notes, will see again Monday. Next HD Monday.  4. BP/vol status: mild edema.  High BUN likely d/t catabolic state from infections/ decub/ tissue loss.  5. Atrial fib - per primary, on eliquis 2.5 BID 6. Anemia ckd - transfuse prn, Hb remains 9- 10 range. on  ESA 7. Hypercalcemia: w/ low PTH 29 d/t immobility, improved, cont low Ca bath 8. SP peg/ divert colostomy   Rob Tarisha Fader 04/05/2020, 9:55 AM   Recent Labs  Lab 04/03/20 0636 04/03/20 0636 04/03/20 2033 04/05/20 0506  K 4.2   < > 3.0* 3.9  BUN 89*   < > 34* 58*  CREATININE 3.06*   < > 1.40* 2.64*  CALCIUM 9.1   < > 8.2* 9.0  PHOS 5.9*   < > 2.8 4.9*  HGB 9.4*  --   --  8.8*   < > = values in this interval not displayed.   Inpatient medications: . apixaban  2.5 mg Per Tube BID  . chlorhexidine  15 mL Mouth Rinse BID  . chlorhexidine gluconate (MEDLINE KIT)  15 mL Mouth Rinse BID  . Chlorhexidine Gluconate Cloth  6 each Topical Q0600  . darbepoetin (ARANESP) injection - DIALYSIS  100 mcg Intravenous Q Mon-HD  . feeding supplement (PRO-STAT SUGAR FREE 64)  30 mL Per Tube BID  . fluconazole  100 mg Per Tube Daily  . insulin aspart  0-6 Units Subcutaneous Q4H  . insulin aspart  2 Units Subcutaneous Q4H  . levothyroxine  25 mcg Per Tube Q0600  . lipase/protease/amylase)  20,880 Units Per Tube Once   And  . sodium bicarbonate  650 mg Per Tube Once  . mouth rinse  15 mL Mouth Rinse q12n4p  . metoprolol tartrate  25 mg Per Tube BID  . nutrition  supplement (JUVEN)  1 packet Per Tube BID BM  . pantoprazole sodium  40 mg Per Tube Daily  . phenylephrine 200 mcg / ml (NON-ED USE ONLY) injection  100 mcg Intracavernosal Once   . dextrose 5 % and 0.9% NaCl 30 mL/hr at 04/05/20 0819  . feeding supplement (NEPRO CARB STEADY) 1,000 mL (04/05/20 0819)   acetaminophen (TYLENOL) oral liquid 160 mg/5 mL, docusate, fentaNYL (SUBLIMAZE) injection, ipratropium-albuterol, metoprolol tartrate, polyethylene glycol

## 2020-04-05 NOTE — Evaluation (Signed)
Less than 5cc residual from gastric tube aspiration.

## 2020-04-06 LAB — GLUCOSE, CAPILLARY
Glucose-Capillary: 111 mg/dL — ABNORMAL HIGH (ref 70–99)
Glucose-Capillary: 96 mg/dL (ref 70–99)
Glucose-Capillary: 153 mg/dL — ABNORMAL HIGH (ref 70–99)
Glucose-Capillary: 115 mg/dL — ABNORMAL HIGH (ref 70–99)
Glucose-Capillary: 133 mg/dL — ABNORMAL HIGH (ref 70–99)
Glucose-Capillary: 158 mg/dL — ABNORMAL HIGH (ref 70–99)

## 2020-04-06 MED ORDER — MAGNESIUM SULFATE 2 GM/50ML IV SOLN
2.0000 g | Freq: Once | INTRAVENOUS | Status: AC
  Administered 2020-04-06: 2 g via INTRAVENOUS
  Filled 2020-04-06: qty 50

## 2020-04-06 NOTE — Progress Notes (Signed)
PROGRESS NOTE  Shane Banks STM:[id number] DOB: 16-Jul-1946 DOA: 02/14/2020 PCP: [name], MD  HPI/Recap of past 24 hours: 74 y.o.malepast medical history significant of chronic respiratory failure status post tracheostomy, end-stage renal disease on dialysis, chronic atrial fibrillation nonhemorrhagic stroke.Patient treated for Covid pneumonia January 2021 and had been on the vent because of progressive ARDS. Because of lack of progression he underwent tracheostomy and was discharged to [facility] 2021.  Was sent back to this facility in May due to trach site bleeding suspected hemoptysis.  He was also noted with sacral decubitus at time of presentation in May.  Since admission he has been treated for multidrug-resistant Pseudomonas pneumonia and MRSA and enterococcal bacteremia. PCCM is following weekly for trach weaning with the assistance of SLP. Patient was liberated from the vent on 03/21/2020 and was transferred to the floor in 03/22/2020. Due to the lack of insurance approval he cannot go back to [facility]. Social worker is working on Clinical research associate.    Today, patient is unable to communicate.  Still tachypneic, but no significant increased work of breathing.  Noted to have a low-grade fever.    Assessment/Plan: Principal Problem:   Enterococcal bacteremia Active Problems:   Acute respiratory failure (HCC)   Hemoptysis   Pressure injury of skin   Protein-calorie malnutrition, severe   Decubitus ulcer of sacral region, stage 4 (HCC)   Fever   Palliative care by specialist   Goals of care, counseling/discussion   DNR (do not resuscitate)   Acute on chronic hypoxemic respiratory failure status post tracheostomy Prolonged mechanical ventilation History of multi drug-resistant Pseudomonas Low-grade fever of 100.6, with downtrending WBC 12.9, if another spike of temp, will consider treating with gentamicin per discussion with ID Patient has  completed 8 doses of IV meropenem as of 7/9 Continue chest PT, pulmonary toilet  Sacral osteomyelitis/HD catheter sepsis/enterococcal bacteremia Currently completed 6 weeks of IV antibiotics Monitor closely  ESRD On HD since March 2021 (M/W/F) Nephrology on board, patient currently not a candidate for outpatient HD  Severe protein calorie malnutrition Continue tube feeds, with gastric residuals every 4 hours   Other chronic issues 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  Family updated on timeframe and will give return call with time when all family members can attend.        Malnutrition Type:  Nutrition Problem: Severe Malnutrition Etiology: chronic illness (chronic respiratory failure related to COVID ARDS)   Malnutrition Characteristics:  Signs/Symptoms: severe muscle depletion, severe fat depletion   Nutrition Interventions:  Interventions: Prostat, Tube feeding, Juven    Estimated body mass index is 21.16 kg/m as calculated from the following:   Height as of this encounter: _0  (1.753 m).   Weight as of this encounter: 65 kg.     Code Status: Full  Family Communication: NP Ebony Hail spoke with Sister [name] on 7/8  Disposition Plan: Status is: Inpatient  Remains inpatient appropriate because:Inpatient level of care appropriate due to severity of illness   Dispo:  Patient From:  Home  Planned Disposition: [place]  Expected discharge date: TBD  Medically stable for discharge: No     Consultants:  PCCM  Procedures:  Mechanical  ventilation  Tracheostomy  Antimicrobials:  None  DVT prophylaxis: Eliquis   Objective: Vitals:   04/06/20 0500 04/06/20 0718 04/06/20 0838 04/06/20 1142  BP:  139/66    Pulse:  81 89 91  Resp:  20 (!) 23 (!) 22  Temp:  98.7 F (37.1 C)    TempSrc:  Axillary    SpO2:  99% 97% 98%  Weight: 65 kg     Height:        Intake/Output Summary (Last 24 hours) at 04/06/2020 1554 Last data filed at 04/06/2020 0838 Gross per 24 hour  Intake 2748.45 ml  Output 835 ml  Net 1913.45 ml   Filed Weights   04/05/20 0835 04/05/20 1210 04/06/20 0500  Weight: 67 kg 65.3 kg 65 kg    Exam:  General: NAD, significant deconditioned, chronically ill-appearing, nonverbal, unable to follow commands  Cardiovascular: S1, S2 present  Respiratory:  Coarse rhonchi noted bilaterally, trach collar noted  Abdomen: Soft, nontender, nondistended, bowel sounds present, colostomy in place, G-tube in place  Musculoskeletal: No bilateral pedal edema noted  Skin:  Sacral decubitus ulcer  Psychiatry:  Unable to assess   Data Reviewed: CBC: Recent Labs  Lab 03/31/20 1026 04/01/20 0435 04/02/20 0807 04/03/20 0636 04/05/20 0506  WBC 17.2* 18.0* 20.2* 14.9* 12.9*  NEUTROABS 12.0*  --   --   --   --   HGB 9.7* 9.5* 9.8* 9.4* 8.8*  HCT 31.1* 30.9* 31.2* 30.5* 28.6*  MCV 81.2 82.6 80.8 81.6 82.9  PLT 267 222 248 227 115   Basic Metabolic Panel: Recent Labs  Lab 03/31/20 0625 03/31/20 0625 04/01/20 0435 04/02/20 0807 04/03/20 0636 04/03/20 2033 04/05/20 0506  NA 137   < > 136 133* 135 134* 135  K 4.6   < > 4.0 3.9 4.2 3.0* 3.9  CL 103   < > 103 97* 100 99 101  CO2 21*   < > _0 GLUCOSE 115*   < > 129* 130* 115* 114* 150*  BUN 79*   < > 118* 74* 89* 34* 58*  CREATININE 2.74*   < > 3.24* 2.45* 3.06* 1.40* 2.64*  CALCIUM 9.1   < > 9.3 9.1 9.1 8.2* 9.0  MG  --   --   --   --   --  1.5*  --   PHOS 3.4  --  4.0  --  5.9* 2.8 4.9*   < > = values in this interval not  displayed.   GFR: Estimated Creatinine Clearance: 23.3 mL/min (A) (by C-G formula based on SCr of 2.64 mg/dL (H)). Liver Function Tests: Recent Labs  Lab 04/01/20 0435 04/02/20 0807 04/03/20 0636 04/03/20 2033 04/05/20 0506  AST  --  33  --   --   --   ALT  --  23  --   --   --   ALKPHOS  --  110  --   --   --   BILITOT  --  0.7  --   --   --   PROT  --  7.8  --   --   --   ALBUMIN 1.9* 2.0* 1.8* 2.5* 2.1*   No results for input(s): LIPASE, AMYLASE in the last 168 hours. No results for input(s): AMMONIA in the last 168 hours. Coagulation Profile: No results for input(s): INR, PROTIME in the last 168 hours. Cardiac Enzymes: No results for input(s): CKTOTAL, CKMB, CKMBINDEX, TROPONINI in the  last 168 hours. BNP (last 3 results) No results for input(s): PROBNP in the last 8760 hours. HbA1C: No results for input(s): HGBA1C in the last 72 hours. CBG: Recent Labs  Lab 04/05/20 1938 04/05/20 2352 04/06/20 0404 04/06/20 0716 04/06/20 1217  GLUCAP 150* 133* 111* 96 153*   Lipid Profile: No results for input(s): CHOL, HDL, LDLCALC, TRIG, CHOLHDL, LDLDIRECT in the last 72 hours. Thyroid Function Tests: No results for input(s): TSH, T4TOTAL, FREET4, T3FREE, THYROIDAB in the last 72 hours. Anemia Panel: No results for input(s): VITAMINB12, FOLATE, FERRITIN, TIBC, IRON, RETICCTPCT in the last 72 hours. Urine analysis:    Component Value Date/Time   COLORURINE AMBER (A) 03/28/2020 0755   APPEARANCEUR TURBID (A) 03/28/2020 0755   LABSPEC 1.017 03/28/2020 0755   PHURINE 5.0 03/28/2020 0755   GLUCOSEU NEGATIVE 03/28/2020 0755   HGBUR LARGE (A) 03/28/2020 0755   BILIRUBINUR NEGATIVE 03/28/2020 0755   KETONESUR NEGATIVE 03/28/2020 0755   PROTEINUR 100 (A) 03/28/2020 0755   NITRITE NEGATIVE 03/28/2020 0755   LEUKOCYTESUR LARGE (A) 03/28/2020 0755   Sepsis Labs: _0 (procalcitonin:4,lacticidven:4)  ) Recent Results (from the past 240 hour(s))  MRSA PCR Screening      Status: None   Collection Time: 03/27/20  7:59 PM   Specimen: Nasal Mucosa; Nasopharyngeal  Result Value Ref Range Status   MRSA by PCR NEGATIVE NEGATIVE Final    Comment:        The GeneXpert MRSA Assay (FDA approved for NASAL specimens only), is one component of a comprehensive MRSA colonization surveillance program. It is not intended to diagnose MRSA infection nor to guide or monitor treatment for MRSA infections. Performed at [facility], [place] [address], [place], [place] [address]   Culture, Urine     Status: Abnormal   Collection Time: 03/28/20  2:10 PM   Specimen: Urine, Random  Result Value Ref Range Status   Specimen Description URINE, RANDOM  Final   Special Requests   Final    NONE Performed at [facility], [place] [address], [place], [place] [address]    Culture 30,000 COLONIES/mL YEAST (A)  Final   Report Status 03/29/2020 FINAL  Final  C Difficile Quick Screen (NO PCR Reflex)     Status: None   Collection Time: 03/29/20 10:20 AM   Specimen: STOOL  Result Value Ref Range Status   C Diff antigen NEGATIVE NEGATIVE Final   C Diff toxin NEGATIVE NEGATIVE Final   C Diff interpretation No C. difficile detected.  Final    Comment: Performed at [facility], [place] [address], [place], [place] [address]  Culture, respiratory (non-expectorated)     Status: None   Collection Time: 04/01/20  9:24 AM   Specimen: Tracheal Aspirate; Respiratory  Result Value Ref Range Status   Specimen Description TRACHEAL ASPIRATE  Final   Special Requests NONE  Final   Gram Stain   Final    RARE WBC PRESENT, PREDOMINANTLY MONONUCLEAR FEW SQUAMOUS EPITHELIAL CELLS PRESENT RARE GRAM NEGATIVE RODS    Culture   Final    FEW PSEUDOMONAS AERUGINOSA MULTI-DRUG RESISTANT ORGANISM RESULT CALLED TO, READ BACK BY AND VERIFIED WITH: IP N COOLEY [phone] MLM Performed at [facility], [place] [address], [place], [place] [address]    Report Status 04/05/2020 FINAL  Final    Organism ID, Bacteria PSEUDOMONAS AERUGINOSA  Final      Susceptibility   Pseudomonas aeruginosa - MIC*    CEFTAZIDIME >=64 RESISTANT  Resistant     CIPROFLOXACIN >=4 RESISTANT Resistant     GENTAMICIN 4 SENSITIVE Sensitive     IMIPENEM >=16 RESISTANT Resistant     * FEW PSEUDOMONAS AERUGINOSA      Studies: No results found.  Scheduled Meds: . apixaban  2.5 mg Per Tube BID  . chlorhexidine  15 mL Mouth Rinse BID  . chlorhexidine gluconate (MEDLINE KIT)  15 mL Mouth Rinse BID  . Chlorhexidine Gluconate Cloth  6 each Topical Q0600  . darbepoetin (ARANESP) injection - DIALYSIS  100 mcg Intravenous Q Mon-HD  . feeding supplement (PRO-STAT SUGAR FREE 64)  30 mL Per Tube BID  . fluconazole  100 mg Per Tube Daily  . insulin aspart  0-6 Units Subcutaneous Q4H  . insulin aspart  2 Units Subcutaneous Q4H  . levothyroxine  25 mcg Per Tube Q0600  . lipase/protease/amylase)  20,880 Units Per Tube Once   And  . sodium bicarbonate  650 mg Per Tube Once  . mouth rinse  15 mL Mouth Rinse q12n4p  . metoprolol tartrate  25 mg Per Tube BID  . nutrition supplement (JUVEN)  1 packet Per Tube BID BM  . pantoprazole sodium  40 mg Per Tube Daily  . phenylephrine 200 mcg / ml (NON-ED USE ONLY) injection  100 mcg Intracavernosal Once    Continuous Infusions: . dextrose 5 % and 0.9% NaCl 30 mL/hr at 04/05/20 0819  . feeding supplement (NEPRO CARB STEADY) 1,000 mL (04/05/20 0819)     LOS: 52 days     Alma Friendly, MD [facility]  If 7PM-7AM, please contact night-coverage [email or web address] 04/06/2020, 3:54 PM

## 2020-04-06 NOTE — Plan of Care (Signed)
°  Problem: Clinical Measurements: Goal: Ability to maintain clinical measurements within normal limits will improve Outcome: Not Progressing   Problem: Clinical Measurements: Goal: Cardiovascular complication will be avoided Outcome: Not Progressing

## 2020-04-06 NOTE — Progress Notes (Signed)
CCMD called 9 beat run VT at time RN was at bedside checking alarms. Patient appeared to be sleeping and in no acute distress. Notified MD of VT and performed EKG per orders (see results in chart). Pt currently in A-flutter in low 100's and tachypneic. Also noted to be sweaty under CPT vest, but not elsewhere. RT also called for assistance with bag lavage. See orders. Will continue to monitor.   04/06/20 1746  Vitals  BP 129/69  MAP (mmHg) 87  BP Location Right Arm  BP Method Automatic  Patient Position (if appropriate) Lying  Pulse Rate (!) 112  Pulse Rate Source Monitor  ECG Heart Rate (!) 112  Cardiac Rhythm Atrial flutter  New onset of dysrhythmia? Yes  Resp (!) 45  Level of Consciousness  Level of Consciousness Responds to Voice  EKG   EKG performed? Placed on chart RN aware  Oxygen Therapy  SpO2 96 %

## 2020-04-07 ENCOUNTER — Inpatient Hospital Stay (HOSPITAL_COMMUNITY): Payer: Medicare HMO

## 2020-04-07 LAB — CBC WITH DIFFERENTIAL/PLATELET
Abs Immature Granulocytes: 0.12 10*3/uL — ABNORMAL HIGH (ref 0.00–0.07)
Basophils Absolute: 0.1 10*3/uL (ref 0.0–0.1)
Basophils Relative: 0 %
Eosinophils Absolute: 1.3 10*3/uL — ABNORMAL HIGH (ref 0.0–0.5)
Eosinophils Relative: 8 %
HCT: 31.3 % — ABNORMAL LOW (ref 39.0–52.0)
Hemoglobin: 9.4 g/dL — ABNORMAL LOW (ref 13.0–17.0)
Immature Granulocytes: 1 %
Lymphocytes Relative: 16 %
Lymphs Abs: 2.6 10*3/uL (ref 0.7–4.0)
MCH: 24.5 pg — ABNORMAL LOW (ref 26.0–34.0)
MCHC: 30 g/dL (ref 30.0–36.0)
MCV: 81.7 fL (ref 80.0–100.0)
Monocytes Absolute: 1.5 10*3/uL — ABNORMAL HIGH (ref 0.1–1.0)
Monocytes Relative: 9 %
Neutro Abs: 11.3 10*3/uL — ABNORMAL HIGH (ref 1.7–7.7)
Neutrophils Relative %: 66 %
Platelets: 275 10*3/uL (ref 150–400)
RBC: 3.83 MIL/uL — ABNORMAL LOW (ref 4.22–5.81)
RDW: 18.1 % — ABNORMAL HIGH (ref 11.5–15.5)
WBC: 16.8 10*3/uL — ABNORMAL HIGH (ref 4.0–10.5)
nRBC: 0 % (ref 0.0–0.2)

## 2020-04-07 LAB — COMPREHENSIVE METABOLIC PANEL
ALT: 20 U/L (ref 0–44)
AST: 33 U/L (ref 15–41)
Albumin: 2.1 g/dL — ABNORMAL LOW (ref 3.5–5.0)
Alkaline Phosphatase: 110 U/L (ref 38–126)
Anion gap: 11 (ref 5–15)
BUN: 79 mg/dL — ABNORMAL HIGH (ref 8–23)
CO2: 23 mmol/L (ref 22–32)
Calcium: 9.4 mg/dL (ref 8.9–10.3)
Chloride: 100 mmol/L (ref 98–111)
Creatinine, Ser: 2.57 mg/dL — ABNORMAL HIGH (ref 0.61–1.24)
GFR calc Af Amer: 28 mL/min — ABNORMAL LOW (ref >60)
GFR calc non Af Amer: 24 mL/min — ABNORMAL LOW (ref >60)
Glucose, Bld: 117 mg/dL — ABNORMAL HIGH (ref 70–99)
Potassium: 3.9 mmol/L (ref 3.5–5.1)
Sodium: 134 mmol/L — ABNORMAL LOW (ref 135–145)
Total Bilirubin: 0.5 mg/dL (ref 0.3–1.2)
Total Protein: 7.4 g/dL (ref 6.5–8.1)

## 2020-04-07 LAB — MAGNESIUM: Magnesium: 2.6 mg/dL — ABNORMAL HIGH (ref 1.7–2.4)

## 2020-04-07 LAB — GLUCOSE, CAPILLARY
Glucose-Capillary: 102 mg/dL — ABNORMAL HIGH (ref 70–99)
Glucose-Capillary: 107 mg/dL — ABNORMAL HIGH (ref 70–99)
Glucose-Capillary: 109 mg/dL — ABNORMAL HIGH (ref 70–99)
Glucose-Capillary: 119 mg/dL — ABNORMAL HIGH (ref 70–99)
Glucose-Capillary: 123 mg/dL — ABNORMAL HIGH (ref 70–99)
Glucose-Capillary: 123 mg/dL — ABNORMAL HIGH (ref 70–99)

## 2020-04-07 MED ORDER — CHLORHEXIDINE GLUCONATE CLOTH 2 % EX PADS
6.0000 | MEDICATED_PAD | Freq: Every day | CUTANEOUS | Status: DC
  Administered 2020-04-08 – 2020-04-09 (×2): 6 via TOPICAL

## 2020-04-07 MED ORDER — DEXTROSE 5 % IV SOLN
0.9400 g | Freq: Two times a day (BID) | INTRAVENOUS | Status: DC
  Administered 2020-04-07 – 2020-04-08 (×2): 0.94 g via INTRAVENOUS
  Filled 2020-04-07 (×4): qty 4.51

## 2020-04-07 NOTE — Progress Notes (Signed)
Updated family. Notified by [name] that family would like to meet Tuesday @ 3:30 to discuss goals of care. RN to inform MD who notify Dr Andria Frames.

## 2020-04-07 NOTE — Progress Notes (Addendum)
PROGRESS NOTE  Shane Banks BTD:[id number] DOB: August 04, 1946 DOA: 02/14/2020 PCP: [name], MD  HPI/Recap of past 24 hours: 74 y.o.malepast medical history significant of chronic respiratory failure status post tracheostomy, end-stage renal disease on dialysis, chronic atrial fibrillation nonhemorrhagic stroke.Patient treated for Covid pneumonia January 2021 and had been on the vent because of progressive ARDS. Because of lack of progression he underwent tracheostomy and was [facility].  Was sent back to this facility in May due to trach site bleeding suspected hemoptysis.  He was also noted with sacral decubitus at time of presentation in May.  Since admission he has been treated for multidrug-resistant Pseudomonas pneumonia and MRSA and enterococcal bacteremia. PCCM is following weekly for trach weaning with the assistance of SLP. Patient was liberated from the vent on 03/21/2020 and was transferred to the floor in 03/22/2020. Due to the lack of insurance approval he cannot go back to [facility]. Social worker is working on Clinical research associate.    Today, patient still noted to be tachypneic, noted worsening secretions.  Had a low-grade temp on 04/05/2020, no further fevers noted.  In light of worsening respiratory status, tube feeds held    Assessment/Plan: Principal Problem:   Enterococcal bacteremia Active Problems:   Acute respiratory failure (HCC)   Hemoptysis   Pressure injury of skin   Protein-calorie malnutrition, severe   Decubitus ulcer of sacral region, stage 4 (HCC)   Fever   Palliative care by specialist   Goals of care, counseling/discussion   DNR (do not resuscitate)   Acute on chronic hypoxemic respiratory failure status post tracheostomy Prolonged mechanical ventilation Sepsis 2/2 possible multidrug-resistant Pseudomonas HCAP qSOFA-2 points-high risk increasing in-hospital mortality History of multi drug-resistant  Pseudomonas Low-grade fever of 100.6, on 7/9, has been afebrile since then Uptrending WBC now 16.8 Patient has completed 8 doses of IV meropenem as of 7/9 Discussed with ID on 04/07/2020, Dr.Comer, recommended to start Avycaz Continue chest PT, pulmonary toilet  Sacral osteomyelitis/HD catheter sepsis/enterococcal bacteremia Currently completed 6 weeks of IV antibiotics Monitor closely  ESRD On HD since March 2021 (M/W/F) Nephrology on board, patient currently not a candidate for outpatient HD  Severe protein calorie malnutrition Held tube feeds on 04/07/2020 in light of worsening respiration status Gastric residuals every 4 hours   Other chronic issues 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  Family updated on timeframe, requested 3:30 PM on 7/13.       Malnutrition Type:  Nutrition Problem: Severe Malnutrition Etiology: chronic illness (chronic respiratory failure related to COVID ARDS)   Malnutrition Characteristics:  Signs/Symptoms: severe muscle depletion, severe fat depletion   Nutrition Interventions:  Interventions: Prostat, Tube feeding, Juven    Estimated body mass index is 22.07 kg/m as calculated from the following:   Height as of this encounter: 5' 9"  (1.753 m).   Weight as of this encounter: 67.8 kg.     Code Status: Full  Family Communication: NP Ebony Hail spoke with Sister [name] on 7/8  Disposition Plan: Status is: Inpatient  Remains inpatient appropriate because:Inpatient level of care appropriate due to severity of illness   Dispo:  Patient From:  Home  Planned Disposition: Skilled  Nursing Facility  Expected discharge date: TBD  Medically stable for discharge: No     Consultants:  PCCM  Procedures:  Mechanical ventilation  Tracheostomy  Antimicrobials:  None  DVT prophylaxis: Eliquis   Objective: Vitals:   04/07/20 0830 04/07/20 1105 04/07/20 1201 04/07/20 1507  BP: (!) 145/89  128/66   Pulse: 66 87 63 78  Resp: (!) 41 (!) 31 15 (!) 26  Temp:      TempSrc:      SpO2: 97% 93% 100% 98%  Weight:      Height:        Intake/Output Summary (Last 24 hours) at 04/07/2020 1721 Last data filed at 04/07/2020 1357 Gross per 24 hour  Intake --  Output 550 ml  Net -550 ml   Filed Weights   04/05/20 1210 04/06/20 0500 04/07/20 0420  Weight: 65.3 kg 65 kg 67.8 kg    Exam:  General: NAD, significant deconditioned, chronically ill-appearing, nonverbal, unable to follow commands  Cardiovascular: S1, S2 present  Respiratory:  Coarse rhonchi noted bilaterally, trach collar noted  Abdomen: Soft, nontender, nondistended, bowel sounds present, colostomy in place, G-tube in place  Musculoskeletal: No bilateral pedal edema noted  Skin:  Sacral decubitus ulcer  Psychiatry:  Unable to assess   Data Reviewed: CBC: Recent Labs  Lab 04/01/20 0435 04/02/20 0807 04/03/20 0636 04/05/20 0506 04/07/20 0654  WBC 18.0* 20.2* 14.9* 12.9* 16.8*  NEUTROABS  --   --   --   --  11.3*  HGB 9.5* 9.8* 9.4* 8.8* 9.4*  HCT 30.9* 31.2* 30.5* 28.6* 31.3*  MCV 82.6 80.8 81.6 82.9 81.7  PLT 222 248 227 264 702   Basic Metabolic Panel: Recent Labs  Lab 04/01/20 0435 04/01/20 0435 04/02/20 0807 04/03/20 0636 04/03/20 2033 04/05/20 0506 04/07/20 0654  NA 136   < > 133* 135 134* 135 134*  K 4.0   < > 3.9 4.2 3.0* 3.9 3.9  CL 103   < > 97* 100 99 101 100  CO2 22   < > 26 25 26 27 23   GLUCOSE 129*   < > 130* 115* 114* 150* 117*  BUN 118*   < > 74* 89* 34* 58* 79*  CREATININE 3.24*   < > 2.45* 3.06* 1.40* 2.64* 2.57*  CALCIUM 9.3   < > 9.1 9.1 8.2* 9.0  9.4  MG  --   --   --   --  1.5*  --  2.6*  PHOS 4.0  --   --  5.9* 2.8 4.9*  --    < > = values in this interval not displayed.   GFR: Estimated Creatinine Clearance: 24.9 mL/min (A) (by C-G formula based on SCr of 2.57 mg/dL (H)). Liver Function Tests: Recent Labs  Lab 04/02/20 0807 04/03/20 0636 04/03/20 2033 04/05/20 0506 04/07/20 0654  AST 33  --   --   --  33  ALT 23  --   --   --  20  ALKPHOS 110  --   --   --  110  BILITOT 0.7  --   --   --  0.5  PROT 7.8  --   --   --  7.4  ALBUMIN 2.0* 1.8* 2.5* 2.1* 2.1*   No results for input(s): LIPASE, AMYLASE in the last 168 hours. No results for input(s): AMMONIA in the last 168 hours. Coagulation Profile: No results for input(s): INR, PROTIME in the last 168 hours. Cardiac Enzymes: No results for  input(s): CKTOTAL, CKMB, CKMBINDEX, TROPONINI in the last 168 hours. BNP (last 3 results) No results for input(s): PROBNP in the last 8760 hours. HbA1C: No results for input(s): HGBA1C in the last 72 hours. CBG: Recent Labs  Lab 04/06/20 2041 04/06/20 2315 04/07/20 0355 04/07/20 0833 04/07/20 1159  GLUCAP 133* 158* 123* 119* 107*   Lipid Profile: No results for input(s): CHOL, HDL, LDLCALC, TRIG, CHOLHDL, LDLDIRECT in the last 72 hours. Thyroid Function Tests: No results for input(s): TSH, T4TOTAL, FREET4, T3FREE, THYROIDAB in the last 72 hours. Anemia Panel: No results for input(s): VITAMINB12, FOLATE, FERRITIN, TIBC, IRON, RETICCTPCT in the last 72 hours. Urine analysis:    Component Value Date/Time   COLORURINE AMBER (A) 03/28/2020 0755   APPEARANCEUR TURBID (A) 03/28/2020 0755   LABSPEC 1.017 03/28/2020 0755   PHURINE 5.0 03/28/2020 0755   GLUCOSEU NEGATIVE 03/28/2020 0755   HGBUR LARGE (A) 03/28/2020 0755   BILIRUBINUR NEGATIVE 03/28/2020 0755   KETONESUR NEGATIVE 03/28/2020 0755   PROTEINUR 100 (A) 03/28/2020 0755   NITRITE NEGATIVE 03/28/2020 0755   LEUKOCYTESUR LARGE (A) 03/28/2020 0755   Sepsis  Labs: @LABRCNTIP (procalcitonin:4,lacticidven:4)  ) Recent Results (from the past 240 hour(s))  C Difficile Quick Screen (NO PCR Reflex)     Status: None   Collection Time: 03/29/20 10:20 AM   Specimen: STOOL  Result Value Ref Range Status   C Diff antigen NEGATIVE NEGATIVE Final   C Diff toxin NEGATIVE NEGATIVE Final   C Diff interpretation No C. difficile detected.  Final    Comment: Performed at [facility], [place] [address], [place], [place] [address]  Culture, respiratory (non-expectorated)     Status: None   Collection Time: 04/01/20  9:24 AM   Specimen: Tracheal Aspirate; Respiratory  Result Value Ref Range Status   Specimen Description TRACHEAL ASPIRATE  Final   Special Requests NONE  Final   Gram Stain   Final    RARE WBC PRESENT, PREDOMINANTLY MONONUCLEAR FEW SQUAMOUS EPITHELIAL CELLS PRESENT RARE GRAM NEGATIVE RODS    Culture   Final    FEW PSEUDOMONAS AERUGINOSA MULTI-DRUG RESISTANT ORGANISM RESULT CALLED TO, READ BACK BY AND VERIFIED WITH: IP N COOLEY [phone] MLM Performed at [facility], [place] [address] [place],  [address]    Report Status 04/05/2020 FINAL  Final   Organism ID, Bacteria PSEUDOMONAS AERUGINOSA  Final      Susceptibility   Pseudomonas aeruginosa - MIC*    CEFTAZIDIME >=64 RESISTANT Resistant     CIPROFLOXACIN >=4 RESISTANT Resistant     GENTAMICIN 4 SENSITIVE Sensitive     IMIPENEM >=16 RESISTANT Resistant     * FEW PSEUDOMONAS AERUGINOSA      Studies: DG Chest 1 View  Result Date: 04/07/2020 CLINICAL DATA:  Shortness of breath EXAM: CHEST  1 VIEW COMPARISON:  April 04, 2020 FINDINGS: The tracheostomy tube and right dialysis catheter stable. Decreasing interstitial opacities. Persistent but decreased opacity in the left base with probable associated effusion. No other interval changes. IMPRESSION: 1. Support apparatus as above. 2. Decreasing interstitial opacities. 3. Decreasing effusion and opacity in the left base.  Electronically Signed   By: Dorise Bullion III M.D   On: 04/07/2020 11:27    Scheduled Meds: . apixaban  2.5 mg Per Tube BID  . chlorhexidine  15 mL Mouth Rinse BID  . chlorhexidine gluconate (MEDLINE KIT)  15 mL Mouth Rinse BID  . Chlorhexidine Gluconate Cloth  6 each Topical Q0600  . [  START ON 04/08/2020] Chlorhexidine Gluconate Cloth  6 each Topical Q0600  . darbepoetin (ARANESP) injection - DIALYSIS  100 mcg Intravenous Q Mon-HD  . feeding supplement (PRO-STAT SUGAR FREE 64)  30 mL Per Tube BID  . fluconazole  100 mg Per Tube Daily  . insulin aspart  0-6 Units Subcutaneous Q4H  . insulin aspart  2 Units Subcutaneous Q4H  . levothyroxine  25 mcg Per Tube Q0600  . lipase/protease/amylase)  20,880 Units Per Tube Once   And  . sodium bicarbonate  650 mg Per Tube Once  . mouth rinse  15 mL Mouth Rinse q12n4p  . metoprolol tartrate  25 mg Per Tube BID  . nutrition supplement (JUVEN)  1 packet Per Tube BID BM  . pantoprazole sodium  40 mg Per Tube Daily  . phenylephrine 200 mcg / ml (NON-ED USE ONLY) injection  100 mcg Intracavernosal Once    Continuous Infusions: . ceftazidime avibactam (AVYCAZ) IVPB    . dextrose 5 % and 0.9% NaCl 30 mL/hr at 04/05/20 0819  . feeding supplement (NEPRO CARB STEADY) 1,000 mL (04/07/20 0603)     LOS: 52 days     Alma Friendly, MD [facility]  If 7PM-7AM, please contact night-coverage [email or web address] 04/07/2020, 5:21 PM

## 2020-04-07 NOTE — Plan of Care (Signed)
  Problem: Activity: Goal: Risk for activity intolerance will decrease Outcome: Not Progressing   

## 2020-04-08 DIAGNOSIS — J9621 Acute and chronic respiratory failure with hypoxia: Secondary | ICD-10-CM

## 2020-04-08 LAB — GLUCOSE, CAPILLARY
Glucose-Capillary: 113 mg/dL — ABNORMAL HIGH (ref 70–99)
Glucose-Capillary: 95 mg/dL (ref 70–99)
Glucose-Capillary: 122 mg/dL — ABNORMAL HIGH (ref 70–99)
Glucose-Capillary: 171 mg/dL — ABNORMAL HIGH (ref 70–99)
Glucose-Capillary: 139 mg/dL — ABNORMAL HIGH (ref 70–99)

## 2020-04-08 LAB — CBC
HCT: 28.1 % — ABNORMAL LOW (ref 39.0–52.0)
Hemoglobin: 8.5 g/dL — ABNORMAL LOW (ref 13.0–17.0)
MCH: 24.3 pg — ABNORMAL LOW (ref 26.0–34.0)
MCHC: 30.2 g/dL (ref 30.0–36.0)
MCV: 80.3 fL (ref 80.0–100.0)
Platelets: 261 10*3/uL (ref 150–400)
RBC: 3.5 MIL/uL — ABNORMAL LOW (ref 4.22–5.81)
RDW: 17.7 % — ABNORMAL HIGH (ref 11.5–15.5)
WBC: 12.3 10*3/uL — ABNORMAL HIGH (ref 4.0–10.5)
nRBC: 0 % (ref 0.0–0.2)

## 2020-04-08 LAB — RENAL FUNCTION PANEL
Albumin: 1.9 g/dL — ABNORMAL LOW (ref 3.5–5.0)
Anion gap: 8 (ref 5–15)
BUN: 92 mg/dL — ABNORMAL HIGH (ref 8–23)
CO2: 27 mmol/L (ref 22–32)
Calcium: 9.2 mg/dL (ref 8.9–10.3)
Chloride: 99 mmol/L (ref 98–111)
Creatinine, Ser: 3.2 mg/dL — ABNORMAL HIGH (ref 0.61–1.24)
GFR calc Af Amer: 21 mL/min — ABNORMAL LOW (ref >60)
GFR calc non Af Amer: 18 mL/min — ABNORMAL LOW (ref >60)
Glucose, Bld: 91 mg/dL (ref 70–99)
Phosphorus: 4.4 mg/dL (ref 2.5–4.6)
Potassium: 4 mmol/L (ref 3.5–5.1)
Sodium: 134 mmol/L — ABNORMAL LOW (ref 135–145)

## 2020-04-08 MED ORDER — DEXTROSE 5 % IV SOLN
0.9400 g | Freq: Every day | INTRAVENOUS | Status: DC
  Administered 2020-04-08 – 2020-04-09 (×2): 0.94 g via INTRAVENOUS
  Filled 2020-04-08 (×4): qty 4.51

## 2020-04-08 MED ORDER — DARBEPOETIN ALFA 100 MCG/0.5ML IJ SOSY
PREFILLED_SYRINGE | INTRAMUSCULAR | Status: AC
  Administered 2020-04-08: 100 ug via INTRAVENOUS
  Filled 2020-04-08: qty 0.5

## 2020-04-08 MED ORDER — HEPARIN SODIUM (PORCINE) 1000 UNIT/ML IJ SOLN
5000.0000 [IU] | Freq: Once | INTRAMUSCULAR | Status: AC
  Administered 2020-04-08: 5000 [IU] via INTRAVENOUS

## 2020-04-08 MED ORDER — NEPRO/CARBSTEADY PO LIQD
1000.0000 mL | ORAL | Status: DC
  Filled 2020-04-08: qty 1000

## 2020-04-08 MED ORDER — PROSOURCE TF PO LIQD
45.0000 mL | Freq: Three times a day (TID) | ORAL | Status: DC
  Administered 2020-04-08 – 2020-04-24 (×45): 45 mL
  Filled 2020-04-08 (×51): qty 45

## 2020-04-08 MED ORDER — HEPARIN SODIUM (PORCINE) 1000 UNIT/ML IJ SOLN
INTRAMUSCULAR | Status: AC
  Administered 2020-04-08: 3800 [IU]
  Filled 2020-04-08: qty 4

## 2020-04-08 MED ORDER — NEPRO/CARBSTEADY PO LIQD
1000.0000 mL | ORAL | Status: DC
  Administered 2020-04-08 – 2020-04-24 (×14): 1000 mL
  Filled 2020-04-08 (×20): qty 1000

## 2020-04-08 NOTE — Progress Notes (Signed)
Nutrition Follow-up  DOCUMENTATION CODES:   Underweight, Severe malnutrition in context of chronic illness  INTERVENTION:   Continue tube feeding via PEG: - Nepro @ 50 ml/hr (1200 ml/day) - ProSource 45 ml TID -Juven BID  Tube feeding regimen provides 2280 kcal, 130 grams of protein, and 872 ml of H2O.   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  Ongoing  GOAL:   Patient will meet greater than or equal to 90% of their needs  Addressed via TF  MONITOR:   Diet advancement, Labs, Weight trends, TF tolerance, Skin, I & O's  REASON FOR ASSESSMENT:   Ventilator, Other (hx PEG)    ASSESSMENT:   74 yo male admitted with hemoptysis. 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  6/02 - s/p new [facility] 6/07 - trach collar 6/18 - returned to ICU for increased secretions requiring vent support 6/24 - back on trach collar 6/25 - transferred out of ICU  Pt not responding to commands or voice. TF held last night due to suspected aspiration. CXR obtained. Suspected secretions and mucous plugging to be the source. Okay to restart feedings per internal medicine. Ethics meeting planned for 7/14.   EDW: 62.8 kg Current weight: 65 kg (up 5 kg over the last week)  Medications: aranesp, SS novolog Labs: Na 134 (L) CBG 95-123  Diet Order:   Diet Order            Diet NPO time specified  Diet effective now                 EDUCATION NEEDS:   No education needs have been identified at this time  Skin:  Skin Assessment: Skin Integrity Issues: Stage IV: sacrum (11cm x 8cm x 1.5cm)  Last BM:  7/11- via colostomy  Height:   Ht Readings from Last 1 Encounters:  03/18/20 5\' 9"  (1.753 m)    Weight:   Wt Readings from Last 1 Encounters:  04/08/20 62.5 kg    Ideal  Body Weight:  72.7 kg  BMI:  Body mass index is 20.35 kg/m.  Estimated Nutritional Needs:   Kcal:  2200-2400  Protein:  110-135 grams  Fluid:  1 L + UOP   Geovany Trudo RD, LDN Clinical Nutrition Pager listed in [place]

## 2020-04-08 NOTE — Progress Notes (Signed)
[facility] Progress Note  Subjective: not responding to commands or voice today  Vitals:   04/08/20 1200 04/08/20 1230 04/08/20 1300 04/08/20 1320  BP: (!) 100/57 97/62 91/63  (P) 115/67  Pulse: 90 91 100 (P) 90  Resp: (!) 29 (!) 33 (!) 32 (!) (P) 27  Temp:      TempSrc:      SpO2:      Weight:    (P) 62.5 kg  Height:        Exam: General: Ill-looking male with a tracheostomy, lethargic and not following commands. Heart:RRR, s1s2 nl Lungs: Bibasal coarse breath sound, no wheezing Abdomen:soft, colostomy bag noted.  Also has feeding tube. Extremities:No edema Dialysis Access: Right IJ TDC.   Summary: Pt is a 74 y.o. yo male  who initially came [facility] after [place] hospitalization which included mech ventilation and trach placement. At [facility] had severe infections/ bacteremia/ PNA and developed AKI felt due to IV gent toxicity requiring initiation of HD 12/16/19. Admitted to ICU [facility] for trach bleed on 5/19. Was getting MWF HD at [facility] prior to admit here  Assessment/ Plan: 1. 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 ID recommending Avycaz started 7/11. High risk for need mech ventilation givne deconditioned state/ ongoing secretions and mucous plugging, per CCM. Ethics meeting planned for tomorrow.  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 Monitor closely 3. ESRD - on HD since mid March 2021. HD MWF at [facility] and MWF here. S/P new TDC 6/2 by IR.  High BUN likely d/t catabolic state from infections/ decub/ tissue loss.  Does not need daily notes, will see on MWF. HD today.  4. BP/vol status: mild vol overload, UF as tolerated 5. Atrial fib - per primary, on eliquis 2.5 BID 6. Anemia ckd - transfuse prn, Hb remains 9- 10  range. on ESA 7. Hypercalcemia: w/ low PTH 29 d/t immobility, improved, cont low Ca bath 8. SP peg/ divert colostomy   Rob Chakia Counts 04/08/2020, 1:55 PM   Recent Labs  Lab 04/05/20 0506 04/05/20 0506 04/07/20 0654 04/08/20 1031  K 3.9   < > 3.9 4.0  BUN 58*   < > 79* 92*  CREATININE 2.64*   < > 2.57* 3.20*  CALCIUM 9.0   < > 9.4 9.2  PHOS 4.9*  --   --  4.4  HGB 8.8*   < > 9.4* 8.5*   < > = values in this interval not displayed.   Inpatient medications: . apixaban  2.5 mg Per Tube BID  . chlorhexidine  15 mL Mouth Rinse BID  . chlorhexidine gluconate (MEDLINE KIT)  15 mL Mouth Rinse BID  . Chlorhexidine Gluconate Cloth  6 each Topical Q0600  . Chlorhexidine Gluconate Cloth  6 each Topical Q0600  . darbepoetin (ARANESP) injection - DIALYSIS  100 mcg Intravenous Q Mon-HD  . feeding supplement (PRO-STAT SUGAR FREE 64)  30 mL Per Tube BID  . fluconazole  100 mg Per Tube Daily  . insulin aspart  0-6 Units Subcutaneous Q4H  . insulin aspart  2 Units Subcutaneous Q4H  . levothyroxine  25 mcg Per Tube Q0600  . lipase/protease/amylase)  20,880 Units Per Tube Once   And  . sodium bicarbonate  650 mg Per Tube Once  . mouth rinse  15 mL Mouth Rinse q12n4p  .  metoprolol tartrate  25 mg Per Tube BID  . nutrition supplement (JUVEN)  1 packet Per Tube BID BM  . pantoprazole sodium  40 mg Per Tube Daily  . phenylephrine 200 mcg / ml (NON-ED USE ONLY) injection  100 mcg Intracavernosal Once   . ceftazidime avibactam (AVYCAZ) IVPB 0.94 g (04/08/20 0527)  . dextrose 5 % and 0.9% NaCl 30 mL/hr at 04/05/20 0819  . feeding supplement (NEPRO CARB STEADY) 1,000 mL (04/07/20 0603)  . feeding supplement (NEPRO CARB STEADY)     acetaminophen (TYLENOL) oral liquid 160 mg/5 mL, docusate, fentaNYL (SUBLIMAZE) injection, ipratropium-albuterol, metoprolol tartrate, polyethylene glycol

## 2020-04-08 NOTE — Progress Notes (Signed)
NAME:  Shane Banks, MRN:  [id number], DOB:  1946/06/15, LOS: 60 ADMISSION DATE:  02/14/2020, CONSULTATION DATE:  5/19 REFERRING MD:  Dr. Alvino Chapel, CHIEF COMPLAINT:  Hemoptysis   Brief History   74 yo m with PMHx of afib with RVR, BPH, stroke, and COVID ARDS in January requiring trach and LTACH placement at [facility]. Had been tolerating trach collar, but on 5/19 developed hemoptysis and hypoxemic respiratory failure requiring vent. Transferred to [facility] on 5/19.  Past Medical History   has a past medical history of Acute on chronic respiratory failure with hypoxia ([place]), Atrial fibrillation with RVR ([place]), BPH (benign prostatic hyperplasia), COVID-19 virus infection, Pneumonia due to COVID-19 virus, Severe sepsis ([place]), and Stroke ([place]).  Significant Hospital Events   1/8  Admit to [facility] for COVID PNA 2/23 Transfer to [facility] on vent 5/19 Transfer to [facility] for hemoptysis. #6 cuffed trach placed. Back on vent. Bronch performed 5/20 Transitioned to trach collar.  5/22 back on vent full time 5/24 back on antibiotics for fevers 6/1 Initiating PSV weans 6/3 Large cuff leak, trach exchange 6/7 transitioned to [facility] 6/9 remains on TC continuously since 6/7 6/23-tolerating trach collar today 6/25-left of the ventilator last evening and tolerated well; tx to TRH/ floor 04/02/2020 new episode of hypoxia copious secretions tachypnea questionable need to return to ICU pulmonary critical care reconsulted 7/9 low grade temps/ ongoing secretions s/p 8 days meropenum 7/11 changed to [place] per ID recs   Consults:  ENT Nephrology ID PCCM  Procedures:  ENT flex scope 5/19 > no bleeding source identified FOB 5/19 > no pulmonary bleeding source identified.   Significant Diagnostic Tests:  CXR 5/28 >> R CVC placement confirmed Slightly improved aeration at the right base. 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 Edema in the muscles around the hips and in the posterior paraspinal musculature of the lower lumbar spine and in the buttocks which could represent myositis. Fluid-fluid level in the bladder may represent protein or debris in the bladder. The possibility of urinary tract infection should be considered.  CXR 6/3> improved R lung aeration, worsening L sided opacity. Tracheostomy tube, HD catheter.   CXR 6/4>wosened R lung infiltrates and stable L sided infiltrate. Possible bilateral pleural effusions  Chest x-ray 6/24-basal infiltrates, stable from prior  CXR 7/11 >> 1. Support apparatus as above. 2. Decreasing interstitial opacities. 3. Decreasing effusion and opacity in the left base.  Micro Data:    tracheal aspirate 5/7> for Pseudomonas A  BAL 5/19 > no growth 5/19 blood > NG 5/22 trach aspirate>> candida parasipolis 5/22 blood>> 1/4 GPC> staph epi 5/25 blood>> GPC (enterococcus on biofire) 2/4 bottles>> E. Faecalis, staph epi. 5/25 trach aspirate>> abundant corynebacterium striatum, few candida parapsilosis 5/25 blood cx>> + for Enterococcus Faecalis (gent resistant)/ Staphylococcus Epidermidis (tetracycline, vanc, rifampin sensitive) 5/27 BCx> neg 6/6 BCx2 >> neg 6/19 trach asp >> Pseudomonas (sensitive to imipenem, intermediate gentamicin) 6/20 BCx2 >> neg 6/30 MRSA PCR >> neg 7/1 UC  >> 30K yeast  7/2 Cdif >> neg 04/01/2020 sputum culture >> pseudomonas (sensitive to gentamicin only)   Antimicrobials:  Levofloxacin 5/24>5/26 vanc 5/25>off 7/5 Ceftriaxone  5/27>6/1 Metronidazole 5/27> off Fortaz 6/1>off Meropenem 03/28/2020>>7/8 Fluconazole 7/8 >> 7/10 Avycaz 7/11 >>  Interim history/subjective:  Bedside RN reports patient's mental status waxes/ wanes No significant change in respiratory-  ongoing moderate secretions, remains on ATC 8-10L Currently  in iHD tmax 99.4  Objective   Blood pressure (P) 121/71, pulse (P) 87, temperature 99.4 F (37.4 C), temperature source Axillary, resp. rate (!) (P) 37, height _0  (1.753 m), weight 65 kg, SpO2 100 %.    FiO2 (%):  [35 %] 35 %   Intake/Output Summary (Last 24 hours) at 04/08/2020 1042 Last data filed at 04/08/2020 0600 Gross per 24 hour  Intake 50 ml  Output 600 ml  Net -550 ml   Filed Weights   04/07/20 0420 04/08/20 0500 04/08/20 0943  Weight: 67.8 kg 67.5 kg 65 kg    Examination:    General:  Chronically ill frail elderly male lying in bed in NAD HEENT: midline [place] 6 XLT distal Neuro: Awake, shakes head no, ? Purposeful, moves upper extremities spont CV: rr,  R TDC HD cath PULM:  Mildly tachypneic, coarse BS, moderate cough, suctioned minimal thick tan GI: +bs, NT, gtube Extremities: warm/dry, no LE edema   CXR 7/11- decreasing interstitial opacities, decreasing opacity left base and effusion compared to 7/8  Resolved Hospital Problem list     Assessment & Plan:   Multidrug resistant Pseudomonas HCAP Acute on chronic hypoxemic respiratory failure requiring tracheostomy, prolonged MV COVID fibrosis Deconditioning/ severe protein calorie malnutrition  Generalized failure to thrive Suspected continued aspiration P:  Ongoing aggressive pulm hygiene and trach care NPO given ongoing concerns of aspiration, SLP following  Abx per primary- Avycaz started 7/11 per ID recs  High risk for needing MV given deconditioned state/ ongoing secretions, and mucous plugging Ethics consulted 7/9 as family has refused further conversations with palliative care.  Meeting planned for the afternoon 7/13.  Overall, he has a very poor prognosis.   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 Per primary  Chronic osteomyelitis. S/p 6 week completion of abx  End-stage renal disease on hemodialysis. Per renal   Pulmonary critical care is to see weekly for trach  care.   Kennieth Rad, MSN, AGACNP-BC [facility] 04/08/2020, 10:42 AM  See Shea Evans for personal pager PCCM on call pager [phone]

## 2020-04-08 NOTE — Progress Notes (Addendum)
[facility] PROGRESS NOTE  Bearett Porcaro MHD:[id number] DOB: 1946-07-04 DOA: 02/14/2020 PCP: [name], MD  Status: Inpatient--Remains inpatient appropriate because :Unsafe d/c plan, IV treatments appropriate due to intensity of illness or inability to take PO and Inpatient level of care appropriate due to severity of illness-patient continues to have tenuous respiratory status   Dispo:  Patient From:  Home  Planned Disposition: [place]  Expected discharge date: 04/01/20  Medically stable for discharge: No-needs to transition to cuffless trach (respiratory status too tenuous to accomplish), needs to be able to sit upright in recliner to pursue dialysis in the outpatient setting (profoundly physically deconditioned and unable to even lift arms at this juncture), -remains on IV antibiotics secondary to sacral osteomyelitis and decubitus and recent bacteremia with last dose due on 7/8  **As of a.m. 7/12 have spoken again with patient's Sister [name] who is also the healthcare power of attorney.  Plan is to have a teleconference on Wednesday, July 14 at 4 PM with the ethics physician Dr. Andria Frames to discuss patient's current status, obtain multiple family member input and make appropriate recommendations for care.  At a minimum myself, Dr. Andria Frames and multiple family members will be participating.  HPI: 74 y.o. male past medical history significant of chronic respiratory failure status post tracheostomy, end-stage renal disease on dialysis, chronic atrial fibrillation nonhemorrhagic stroke. Patient treated for Covid pneumonia January 2021 and had been on the vent because of progressive ARDS.  Because of lack of progression he underwent tracheostomy and was [facility].  Was sent back to this facility in May due to trach site bleeding suspected hemoptysis.  He was also noted with sacral decubitus at time of presentation in May.  Since admission he has  been treated for multidrug-resistant Pseudomonas pneumonia and MRSA and enterococcal bacteremia.  On 03/15/2020 patient returned to the ICU for ventilatory and pressor support he developed septic shock and developed acute renal failure requiring dialysis.  He developed sepsis physiology likely secondary to sacral decubitus ulcer with underlying osteomyelitis.    He has been weaned from pressors. PCCM is following weekly for trach weaning with the assistance of SLP.  Patient was liberated from the vent on 03/21/2020 and was transferred to the floor in 03/22/2020.  Due to the lack of insurance approval he cannot go back to [facility].  Social worker is working on Clinical research associate.  He will need to be transition to a cuffless trach and be able to sit upright in a recliner for hemodialysis before he can discharge to skilled nursing facility.  On the morning of 7/6 patient developed hypoxemia and increased work of breathing with shallow respiratory effort, requiring frequent suctioning.  O2 sats decreased into the 70s but improved to 95% with bagging and suctioning.  Briefly required increase of FiO2 to 100% but was eventually decreased back to 40%.  Tracheal aspirate appeared consistent with tube feedings therefore tube feedings were stopped.  Over the weekend has continued had issues with tachypnea prompting brief interruption of tube feedings that were resumed on 7/12.  Did spike low-grade fever on the evening of 7/9 so at the recommendation of infectious disease service given multidrug-resistant Pseudomonas recommendation was to begin [place] plus [name].  Subjective: Examined prior to leaving for hemodialysis.  Much more alert today.  Patient appears to be nodding appropriately to questions asked but unclear if he has capacity to understand verity of current status and poor prognosis. Attending MD notified me  that over the weekend with multiple family members present he requested to go home but  both patient and family updated that patient status to labile to go home if he is to remain a full code.  They were also updated on plans for detailed discussions with ethics committee this coming week.  Objective: Vitals:   04/08/20 0948 04/08/20 1000  BP: (!) 141/74 (P) 121/71  Pulse: 84 (P) 87  Resp: (!) 36 (!) (P) 37  Temp:    SpO2:      Intake/Output Summary (Last 24 hours) at 04/08/2020 1031 Last data filed at 04/08/2020 0600 Gross per 24 hour  Intake 50 ml  Output 600 ml  Net -550 ml   Filed Weights   04/07/20 0420 04/08/20 0500 04/08/20 0943  Weight: 67.8 kg 67.5 kg 65 kg    Exam:  Constitutional: NAD, calm and alert today, nodding appropriately to questions asked, appears to be comfortable at this time Neck: normal, supple, no masses-midline cuffed trach in place  Respiratory: Bilateral lung sounds coarse on auscultation without obvious rhonchi or wheezing.  Tachypneic but without significant increased work of breathing, trach collar oxygen/FiO2 35%-suctioned of minimal secretions prior to transport Cardiovascular: Regular rate and rhythm, no murmurs / rubs / gallops. Not in upper extremities is improving. 2+ pedal pulses. No carotid bruits.  Dialysis catheter right subclavian vein. Abdomen: no tenderness, no masses palpated. No hepatosplenomegaly. Bowel sounds positive.  Colostomy in place with light brown loose stool.  Gastrostomy tube clamped GU: Condom catheter in place draining yellow-colored urine to bedside bag Musculoskeletal: no clubbing / cyanosis. No joint deformity upper and lower extremities.  Not spontaneously moving UEs but with PROM has adequate range of motion. Extension contracture involving the right wrist. Diminished muscle tone throughout all extremities and too weak to follow commands with upper extremities/unable to lift extremities upon command  Skin: no rashes, lesions, sacral decubitus not examined today Neurologic: CN 2-12 grossly intact. 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  PCCM following periodically to assess readiness for transition cuffless trach. History of multidrug-resistant Pseudomonas and follow-up chest x-ray revealed diffuse left-sided airspace disease with patchy right airspace disease On 7/6 respiratory decompensation as noted above.  Aspiration at this juncture appears to be contributory factor.  PCCM has reevaluated patient and since patient is full code if he continues to worsen he will need mechanical ventilatory support.  Of note, family refusing further conversations with palliative medicine team.  Contacted ethics team 7/9 and tentative meeting with family planned for Wednesday 7/14 at 4 PM-modality will be teleconference Has completed 8 doses of IV meropenem as of 7/9. WBC down to 12.9.  Sputum culture has finalized with few Pseudomonas multidrug resistant including to imipenem/Carbapenem's but was sensitive to gentamicin.   Patient developed low-grade fever on 7/9-discussed with ID who recommended [name]  with [name] 7/12; WBC down to 12.3 Unfortunately given recurrent pneumonia and increased secretions with associated generalized weakness/weak cough it is highly doubtful he can transition to cuffless trach at this point.  Overall prognosis is poor.  Continue chest physiotherapy with vibra vest administered per RT  Diarrhea Likely noninfectious in etiology-C. difficile negative Suspect related to antibiotics and other medications bolus tube  feeding Continues to have watery stools per colostomy  Mild funguria/oral  yeast Urine culture with 30,000 colonies of yeast Continue Diflucan-had started last week but fell off medication list and was resumed on 7/8  Sacral osteomyelitis and HD catheter sepsis/  Enterococcal bacteremia: Tunneled catheter removal on 02/21/2020. Has tunneled HD catheter right subclavian-side unremarkable Will require 6 weeks of IV antibiotics per ID (vancomycin and Flagyl.) Currently has PIV in place for antibiotics-last dose 7/8  Paroxysmal atrial fibrillation/Acquired thrombophilia: Currently rate controlled on metoprolol Anticoagulation previously discontinued due to suspected GI bleed resumed 5/31 and patient has tolerated it any signs of bleeding.  End-stage renal disease: On HD since March 2021 status post tunneled catheter placement 02/28/2020 and as of 7/1 site unremarkable. Dialysis days: MWF SNF reports patient will need to be able to sit upright in recliner to receive hemodialysis before they will accept him as a patient Unfortunately, this patient continues to decline, he has significant weakness and is unable to tolerate sitting up in a recliner receive outpatient hemodialysis.  Attending nephrologist has documented that patient is not a candidate for outpatient hemodialysis  Chronic normocytic anemia: Due to anemia of chronic renal disease Status post 1 unit of PRBCs Hgb on 7/9 was 8.8 Continue erythropoietin and IV iron per renal.  History of nonhemorrhagic CVA: CT of the head showed atrophy and chronic microvascular disease. 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 Documented as full-thickness tissue loss  Severe protein caloric malnutrition: Tube feedings on hold due to concerns of a possible aspiration as above.  Patient is dialysis patient but will allow D5 normal saline at 30 cc/h to replace tube feeding volume loss Will allow medications per tube with small amounts of  flush Nutrition Status: Nutrition Problem: Severe Malnutrition Etiology: chronic illness (chronic respiratory failure related to COVID ARDS) Signs/Symptoms: severe muscle depletion, severe fat depletion Interventions: Prostat, Tube feeding, Juven-plan to resume tube feeding slowly beginning at 10 cc/h and increasing fill out goal rate.  Also plan to check every 4 hour gastric residuals-as of 7/9 tube feedings at 50 cc/h with gastric residuals less than 10 cc when checked      Data Reviewed: Basic Metabolic Panel: Recent Labs  Lab 04/02/20 0807 04/03/20 0636 04/03/20 2033 04/05/20 0506 04/07/20 0654  NA 133* 135 134* 135 134*  K 3.9 4.2 3.0* 3.9 3.9  CL 97* 100 99 101 100  CO2 _0 GLUCOSE 130* 115* 114* 150* 117*  BUN 74* 89* 34* 58* 79*  CREATININE 2.45* 3.06* 1.40* 2.64* 2.57*  CALCIUM 9.1 9.1 8.2* 9.0 9.4  MG  --   --  1.5*  --  2.6*  PHOS  --  5.9* 2.8 4.9*  --    Liver Function Tests: Recent Labs  Lab 04/02/20 0807 04/03/20 0636 04/03/20 2033 04/05/20 0506 04/07/20 0654  AST 33  --   --   --  33  ALT 23  --   --   --  20  ALKPHOS 110  --   --   --  110  BILITOT 0.7  --   --   --  0.5  PROT 7.8  --   --   --  7.4  ALBUMIN 2.0* 1.8* 2.5* 2.1* 2.1*   No results for input(s): LIPASE, AMYLASE in the last 168 hours. No results for input(s): AMMONIA in the last 168 hours. CBC: Recent Labs  Lab 04/02/20 [phone]  04/03/20 0636 04/05/20 0506 04/07/20 0654  WBC 20.2* 14.9* 12.9* 16.8*  NEUTROABS  --   --   --  11.3*  HGB 9.8* 9.4* 8.8* 9.4*  HCT 31.2* 30.5* 28.6* 31.3*  MCV 80.8 81.6 82.9 81.7  PLT 248 227 264 275   Cardiac Enzymes: No results for input(s): CKTOTAL, CKMB, CKMBINDEX, TROPONINI in the last 168 hours. BNP (last 3 results) No results for input(s): BNP in the last 8760 hours.  ProBNP (last 3 results) No results for input(s): PROBNP in the last 8760 hours.  CBG: Recent Labs  Lab 04/07/20 1736 04/07/20 2039 04/07/20 2348  04/08/20 0349 04/08/20 0831  GLUCAP 102* 109* 123* 113* 95    Recent Results (from the past 240 hour(s))  Culture, respiratory (non-expectorated)     Status: None   Collection Time: 04/01/20  9:24 AM   Specimen: Tracheal Aspirate; Respiratory  Result Value Ref Range Status   Specimen Description TRACHEAL ASPIRATE  Final   Special Requests NONE  Final   Gram Stain   Final    RARE WBC PRESENT, PREDOMINANTLY MONONUCLEAR FEW SQUAMOUS EPITHELIAL CELLS PRESENT RARE GRAM NEGATIVE RODS    Culture   Final    FEW PSEUDOMONAS AERUGINOSA MULTI-DRUG RESISTANT ORGANISM RESULT CALLED TO, READ BACK BY AND VERIFIED WITH: IP N COOLEY [phone] MLM Performed at [facility], [place] [address], [place], [place] [address]    Report Status 04/05/2020 FINAL  Final   Organism ID, Bacteria PSEUDOMONAS AERUGINOSA  Final      Susceptibility   Pseudomonas aeruginosa - MIC*    CEFTAZIDIME >=64 RESISTANT Resistant     CIPROFLOXACIN >=4 RESISTANT Resistant     GENTAMICIN 4 SENSITIVE Sensitive     IMIPENEM >=16 RESISTANT Resistant     * FEW PSEUDOMONAS AERUGINOSA     Studies: DG Chest 1 View  Result Date: 04/07/2020 CLINICAL DATA:  Shortness of breath EXAM: CHEST  1 VIEW COMPARISON:  April 04, 2020 FINDINGS: The tracheostomy tube and right dialysis catheter stable. Decreasing interstitial opacities. Persistent but decreased opacity in the left base with probable associated effusion. No other interval changes. IMPRESSION: 1. Support apparatus as above. 2. Decreasing interstitial opacities. 3. Decreasing effusion and opacity in the left base. Electronically Signed   By: Dorise Bullion III M.D   On: 04/07/2020 11:27    Scheduled Meds: . apixaban  2.5 mg Per Tube BID  . chlorhexidine  15 mL Mouth Rinse BID  . chlorhexidine gluconate (MEDLINE KIT)  15 mL Mouth Rinse BID  . Chlorhexidine Gluconate Cloth  6 each Topical Q0600  . Chlorhexidine Gluconate Cloth  6 each Topical Q0600  . darbepoetin (ARANESP)  injection - DIALYSIS  100 mcg Intravenous Q Mon-HD  . feeding supplement (PRO-STAT SUGAR FREE 64)  30 mL Per Tube BID  . fluconazole  100 mg Per Tube Daily  . insulin aspart  0-6 Units Subcutaneous Q4H  . insulin aspart  2 Units Subcutaneous Q4H  . levothyroxine  25 mcg Per Tube Q0600  . lipase/protease/amylase)  20,880 Units Per Tube Once   And  . sodium bicarbonate  650 mg Per Tube Once  . mouth rinse  15 mL Mouth Rinse q12n4p  . metoprolol tartrate  25 mg Per Tube BID  . nutrition supplement (JUVEN)  1 packet Per Tube BID BM  . pantoprazole sodium  40 mg Per Tube Daily  . phenylephrine 200 mcg / ml (NON-ED USE ONLY) injection  100 mcg Intracavernosal Once  Continuous Infusions: . ceftazidime avibactam (AVYCAZ) IVPB 0.94 g (04/08/20 0527)  . dextrose 5 % and 0.9% NaCl 30 mL/hr at 04/05/20 0819  . feeding supplement (NEPRO CARB STEADY) 1,000 mL (04/07/20 0603)  . feeding supplement (NEPRO CARB STEADY)      Principal Problem:   Enterococcal bacteremia Active Problems:   Acute respiratory failure (HCC)   Hemoptysis   Pressure injury of skin   Protein-calorie malnutrition, severe   Decubitus ulcer of sacral region, stage 4 (HCC)   Fever   Palliative care by specialist   Goals of care, counseling/discussion   DNR (do not resuscitate)     Code Status: Full Family Communication: [name] sister 03/29/20, brother [name] on 04/02/20 DVT prophylaxis: Eliquis    Consultants: PCCM Ethics committee/Dr. Andria Frames  Procedures: Echocardiogram 1. Left ventricular ejection fraction, by estimation, is 60 to 65%. The  left ventricle has normal function. The left ventricle has no regional  wall motion abnormalities. There is severe left ventricular hypertrophy.  Left ventricular diastolic parameters  are consistent with Grade I diastolic dysfunction (impaired relaxation).  2. Right ventricular systolic function is normal. The right ventricular  size is normal. There is  mildly elevated pulmonary artery systolic  pressure.  3. Left atrial size was mildly dilated.  4. Right atrial size was mildly dilated.  5. The mitral valve is grossly normal. Trivial mitral valve  regurgitation.  6. The aortic valve is tricuspid. Aortic valve regurgitation is trivial.   Antibiotics: Anti-infectives (From admission, onward)   Start     Dose/Rate Route Frequency Ordered Stop   04/07/20 1800  ceftazidime-avibactam (AVYCAZ) 0.94 g in dextrose 5 % 50 mL IVPB     Discontinue     0.94 g 25 mL/hr over 2 Hours Intravenous Every 12 hours 04/07/20 1322     04/05/20 1000  fluconazole (DIFLUCAN) 40 MG/ML suspension 100 mg     Discontinue    "Followed by" Linked Group Details   100 mg Per Tube Daily 04/04/20 2203     04/04/20 2300  fluconazole (DIFLUCAN) 40 MG/ML suspension 200 mg       "Followed by" Linked Group Details   200 mg Per Tube  Once 04/04/20 2203 04/04/20 2325   04/01/20 1013  vancomycin (VANCOCIN) 500-5 MG/100ML-% IVPB       Note to Pharmacy: Cherylann Banas   : cabinet override      04/01/20 1013 04/01/20 1253   03/29/20 1552  vancomycin (VANCOCIN) 500-5 MG/100ML-% IVPB       Note to Pharmacy: Anthonette Legato, Melisa   : cabinet override      03/29/20 1552 03/29/20 1556   03/29/20 1415  fluconazole (DIFLUCAN) 40 MG/ML suspension 100 mg        100 mg Oral Daily 03/29/20 1408 03/31/20 1015   03/28/20 2000  meropenem (MERREM) 500 mg in sodium chloride 0.9 % 100 mL IVPB        500 mg 200 mL/hr over 30 Minutes Intravenous Every 24 hours 03/28/20 1623 04/04/20 2201   03/25/20 1800  cefTAZidime (FORTAZ) 1 g in sodium chloride 0.9 % 100 mL IVPB  Status:  Discontinued       "Followed by" Linked Group Details   1 g 200 mL/hr over 30 Minutes Intravenous Every 24 hours 03/18/20 1015 03/28/20 1613   03/22/20 0830  vancomycin (VANCOREADY) IVPB 500 mg/100 mL        500 mg 100 mL/hr over 60 Minutes Intravenous  Once 03/22/20 0827 03/22/20 1014  03/20/20 1200  vancomycin (VANCOCIN)  IVPB 500 mg/100 ml premix        500 mg 100 mL/hr over 60 Minutes Intravenous Every M-W-F (Hemodialysis) 03/19/20 0941 04/03/20 1500   03/18/20 1800  meropenem (MERREM) 500 mg in sodium chloride 0.9 % 100 mL IVPB       "Followed by" Linked Group Details   500 mg 200 mL/hr over 30 Minutes Intravenous Every 24 hours 03/18/20 1015 03/25/20 0026   03/18/20 1200  vancomycin (VANCOCIN) IVPB 500 mg/100 ml premix        500 mg 100 mL/hr over 60 Minutes Intravenous Every M-W-F (Hemodialysis) 03/18/20 0938 03/18/20 1700   03/18/20 1200  cefTAZidime (FORTAZ) 2 g in sodium chloride 0.9 % 100 mL IVPB  Status:  Discontinued        2 g 200 mL/hr over 30 Minutes Intravenous Every Mon (Hemodialysis) 03/18/20 0939 03/18/20 1015   03/07/20 1800  cefTAZidime (FORTAZ) 2 g in sodium chloride 0.9 % 100 mL IVPB  Status:  Discontinued        2 g 200 mL/hr over 30 Minutes Intravenous Every T-Th-Sa (1800) 03/06/20 1255 03/18/20 1015   03/07/20 1200  vancomycin (VANCOCIN) IVPB 500 mg/100 ml premix  Status:  Discontinued        500 mg 100 mL/hr over 60 Minutes Intravenous Every T-Th-Sa (Hemodialysis) 03/06/20 1255 03/19/20 0941   03/04/20 2000  cefTAZidime (FORTAZ) 2 g in sodium chloride 0.9 % 100 mL IVPB  Status:  Discontinued        2 g 200 mL/hr over 30 Minutes Intravenous Every M-W-F (2000) 03/01/20 1136 03/06/20 1255   03/02/20 1200  vancomycin (VANCOCIN) IVPB 500 mg/100 ml premix        500 mg 100 mL/hr over 60 Minutes Intravenous Every T-Th-Sa (Hemodialysis) 03/01/20 1131 03/02/20 1710   03/01/20 1230  vancomycin (VANCOCIN) IVPB 500 mg/100 ml premix  Status:  Discontinued        500 mg 100 mL/hr over 60 Minutes Intravenous Every M-W-F (Hemodialysis) 03/01/20 1131 03/06/20 1255   02/28/20 1110  vancomycin (VANCOREADY) IVPB 500 mg/100 mL        over 60 Minutes  Continuous PRN 02/28/20 1112 02/28/20 1110   02/28/20 1101  vancomycin (VANCOCIN) 1-5 GM/200ML-% IVPB  Status:  Discontinued       Note to Pharmacy:  Lytle Butte   : cabinet override      02/28/20 1101 02/28/20 1327   02/27/20 1800  cefTAZidime (FORTAZ) 1 g in sodium chloride 0.9 % 100 mL IVPB        1 g 200 mL/hr over 30 Minutes Intravenous Every 24 hours 02/27/20 0947 03/03/20 1738   02/27/20 1200  vancomycin (VANCOCIN) IVPB 500 mg/100 ml premix        500 mg 100 mL/hr over 60 Minutes Intravenous Once 02/27/20 0829 02/27/20 1343   02/24/20 1800  vancomycin (VANCOREADY) IVPB 500 mg/100 mL        500 mg 100 mL/hr over 60 Minutes Intravenous  Once 02/24/20 1308 02/24/20 1905   02/22/20 2200  metroNIDAZOLE (FLAGYL) tablet 500 mg  Status:  Discontinued        500 mg Per Tube Every 8 hours 02/22/20 0957 03/30/20 1057   02/22/20 1700  cefTRIAXone (ROCEPHIN) 2 g in sodium chloride 0.9 % 100 mL IVPB  Status:  Discontinued        2 g 200 mL/hr over 30 Minutes Intravenous Every 24 hours 02/22/20 0957 02/27/20 0947   02/22/20  1200  vancomycin (VANCOCIN) IVPB 500 mg/100 ml premix  Status:  Discontinued        500 mg 100 mL/hr over 60 Minutes Intravenous Every T-Th-Sa (Hemodialysis) 02/20/20 1253 02/21/20 1040   02/21/20 1431  vancomycin variable dose per unstable renal function (pharmacist dosing)  Status:  Discontinued         Does not apply See admin instructions 02/21/20 1431 03/01/20 1131   02/21/20 1200  vancomycin (VANCOREADY) IVPB 500 mg/100 mL        500 mg 100 mL/hr over 60 Minutes Intravenous  Once 02/21/20 0956 02/21/20 1253   02/21/20 0714  vancomycin (VANCOCIN) 500-5 MG/100ML-% IVPB  Status:  Discontinued       Note to Pharmacy: Ashley Akin   : cabinet override      02/21/20 0714 02/21/20 1346   02/20/20 1400  vancomycin (VANCOREADY) IVPB 1250 mg/250 mL        1,250 mg 166.7 mL/hr over 90 Minutes Intravenous  Once 02/20/20 1253 02/20/20 1538   02/20/20 1200  levofloxacin (LEVAQUIN) IVPB 500 mg  Status:  Discontinued        500 mg 100 mL/hr over 60 Minutes Intravenous Every 48 hours 02/20/20 1020 02/21/20 0943   02/15/20  1545  vancomycin (VANCOCIN) IVPB 1000 mg/200 mL premix  Status:  Discontinued       "Followed by" Linked Group Details   1,000 mg 200 mL/hr over 60 Minutes Intravenous Every 24 hours 02/14/20 1530 02/14/20 1745   02/15/20 0330  ceFEPIme (MAXIPIME) 2 g in sodium chloride 0.9 % 100 mL IVPB  Status:  Discontinued        2 g 200 mL/hr over 30 Minutes Intravenous Every 12 hours 02/14/20 1531 02/14/20 1531   02/14/20 1531  ceFEPIme (MAXIPIME) 2 g in sodium chloride 0.9 % 100 mL IVPB  Status:  Discontinued        2 g 200 mL/hr over 30 Minutes Intravenous Every 12 hours 02/14/20 1531 02/14/20 1745   02/14/20 1530  ceFEPIme (MAXIPIME) 2 g in sodium chloride 0.9 % 100 mL IVPB  Status:  Discontinued        2 g 200 mL/hr over 30 Minutes Intravenous  Once 02/14/20 1520 02/14/20 1531   02/14/20 1530  vancomycin (VANCOREADY) IVPB 1500 mg/300 mL  Status:  Discontinued       "Followed by" Linked Group Details   1,500 mg 150 mL/hr over 120 Minutes Intravenous  Once 02/14/20 1530 02/14/20 1745        Time spent: 25 minutes    Erin Hearing ANP [facility] Pager [phone]. If 7PM-7AM, please contact night-coverage at [email or web address] 04/08/2020, 10:31 AM  LOS: 54 days

## 2020-04-09 LAB — GLUCOSE, CAPILLARY
Glucose-Capillary: 151 mg/dL — ABNORMAL HIGH (ref 70–99)
Glucose-Capillary: 147 mg/dL — ABNORMAL HIGH (ref 70–99)
Glucose-Capillary: 156 mg/dL — ABNORMAL HIGH (ref 70–99)
Glucose-Capillary: 156 mg/dL — ABNORMAL HIGH (ref 70–99)
Glucose-Capillary: 147 mg/dL — ABNORMAL HIGH (ref 70–99)
Glucose-Capillary: 138 mg/dL — ABNORMAL HIGH (ref 70–99)

## 2020-04-09 MED ORDER — DEXTROSE 5 % IV SOLN: INTRAVENOUS | Status: DC

## 2020-04-09 NOTE — Progress Notes (Signed)
[facility] PROGRESS NOTE  Shane Banks DGL:[id number] DOB: 1946-01-13 DOA: 02/14/2020 PCP: [name], MD  Status: Inpatient--Remains inpatient appropriate because :Unsafe d/c plan, IV treatments appropriate due to intensity of illness or inability to take PO and Inpatient level of care appropriate due to severity of illness-patient continues to have tenuous respiratory status   Dispo:  Patient From:  Home  Planned Disposition: [facility]  Expected discharge date: 04/01/20  Medically stable for discharge: No-needs to transition to cuffless trach (respiratory status too tenuous to accomplish), needs to be able to sit upright in recliner to pursue dialysis in the outpatient setting (profoundly physically deconditioned and unable to even lift arms at this juncture), -remains on IV antibiotics secondary to sacral osteomyelitis and decubitus and recent bacteremia with last dose due on 7/8  **7/12 have spoken again with patient's Sister Shane Banks who is also the healthcare power of attorney.  Plan is to have a teleconference on Wednesday, July 14 at 4 PM with the ethics physician Dr. Andria Frames to discuss patient's current status, obtain multiple family member input and make appropriate recommendations for care.  At a minimum myself, Dr. Andria Frames and multiple family members will be participating.  HPI: 74 y.o. male past medical history significant of chronic respiratory failure status post tracheostomy, end-stage renal disease on dialysis, chronic atrial fibrillation nonhemorrhagic stroke. Patient treated for Covid pneumonia January 2021 and had been on the vent because of progressive ARDS.  Because of lack of progression he underwent tracheostomy and [facility].  Was sent back to this facility in May due to trach site bleeding suspected hemoptysis.  He was also noted with sacral decubitus at time of presentation in May.  Since admission he has been treated  for multidrug-resistant Pseudomonas pneumonia and MRSA and enterococcal bacteremia.  On 03/15/2020 patient returned to the ICU for ventilatory and pressor support he developed septic shock and developed acute renal failure requiring dialysis.  He developed sepsis physiology likely secondary to sacral decubitus ulcer with underlying osteomyelitis.    He has been weaned from pressors. PCCM is following weekly for trach weaning with the assistance of SLP.  Patient was liberated from the vent on 03/21/2020 and was transferred to the floor in 03/22/2020.  Due to the lack of insurance approval he cannot go [facility] to [facility].  Social worker is working on Clinical research associate.  He will need to be transition to a cuffless trach and be able to sit upright in a recliner for hemodialysis before he can discharge to skilled nursing facility.  On the morning of 7/6 patient developed hypoxemia and increased work of breathing with shallow respiratory effort, requiring frequent suctioning.  O2 sats decreased into the 70s but improved to 95% with bagging and suctioning.  Briefly required increase of FiO2 to 100% but was eventually decreased back to 40%.  Tracheal aspirate appeared consistent with tube feedings therefore tube feedings were stopped.  Over the weekend has continued had issues with tachypnea prompting brief interruption of tube feedings that were resumed on 7/12.  Did spike low-grade fever on the evening of 7/9 so at the recommendation of infectious disease service given multidrug-resistant Pseudomonas recommendation was to begin [place] plus [name].  Subjective: Alert today. When asked if he was doing okay he said yes  Objective: Vitals:   04/09/20 0708 04/09/20 0722  BP: 118/70 118/70  Pulse: 89 93  Resp: (!) 47 (!) 30  Temp: 100 F (37.8 C)  SpO2: 96% 94%    Intake/Output Summary (Last 24 hours) at 04/09/2020 1048 Last data filed at 04/09/2020 0546 Gross per 24 hour  Intake 2385.06 ml   Output 2337 ml  Net 48.06 ml   Filed Weights   04/08/20 0943 04/08/20 1320 04/09/20 0326  Weight: 65 kg 62.5 kg 71 kg    Exam:  Constitutional: NAD, calm and alert today, nodding appropriately to questions asked, appears to be comfortable at this time Neck: normal, supple, no masses-midline cuffed trach in place  Respiratory: Bilateral lung sounds coarse on auscultation without obvious rhonchi or wheezing.  Stable, chronic tachypneic but without significant increased work of breathing, trach collar oxygen/FiO2 28% Cardiovascular: Irregular rate with underlying rhythm atrial flutter rate controlled in the 80s to 90s, no murmurs / rubs / gallops.  Trace upper extremity edema improving.. 2+ pedal pulses. No carotid bruits.  Dialysis catheter right subclavian vein. Abdomen: no tenderness, no masses palpated. No hepatosplenomegaly. Bowel sounds positive.  Colostomy in place with light brown loose stool.  Gastrostomy tube clamped GU: Condom catheter in place draining yellow-colored urine to bedside bag Musculoskeletal: no clubbing / cyanosis. No joint deformity upper and lower extremities.  Not spontaneously moving UEs but with PROM has adequate range of motion. Extension contracture involving the right wrist. Diminished muscle tone throughout all extremities and too weak to follow commands with upper extremities/unable to lift extremities upon command  Skin: no rashes, lesions, sacral decubitus not examined today Neurologic: CN 2-12 grossly intact. 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  PCCM following periodically to assess readiness for transition cuffless trach. 7/6: Significant respiratory decompensation with FiO2 increased up to 60%. Aspiration and recurrent Pseudomonas pneumonia as etiology. Of note, family refusing further conversations with palliative medicine team.  Contacted ethics team 7/9 and tentative meeting with family planned for Wednesday 7/14 at 4 PM-modality will be teleconference Has completed 8 doses of IV meropenem as of 7/9.   Repeat sputum culture obtained prior to initiation of meropenem has finalized with few MDR Pseudomonas (resistant to imipenem/Carbapenem's but sensitive to gentamicin).   Patient developed low-grade fever on 7/9-discussed with ID who recommended [name]  with [name] 7/12: WBC down to 12.3 Unfortunately given recurrent pneumonia and increased secretions with associated generalized weakness/weak cough it is highly doubtful he can transition to cuffless trach at this point.  Overall prognosis is poor.  Continue chest physiotherapy with vibra vest administered per RT  Diarrhea Likely noninfectious in etiology-C. difficile negative Suspect related to antibiotics and other medications bolus tube feeding Continues to have watery stools per colostomy  Mild funguria/oral yeast Urine culture with 30,000 colonies of yeast Continue Diflucan-had started last week but fell off medication list and was resumed on 7/8  Sacral osteomyelitis and HD catheter sepsis/  Enterococcal bacteremia: Tunneled catheter removal on 02/21/2020. Has tunneled HD catheter right subclavian-side unremarkable Pleated 6 weeks of IV vancomycin and Flagyl 7/8  Paroxysmal atrial fibrillation/Acquired thrombophilia: Currently rate controlled on metoprolol Of 7/13 rate controlled atrial flutter Anticoagulation previously discontinued due to suspected GI bleed and was resumed 5/31 and patient has tolerated it any signs of  bleeding.  End-stage renal disease:  On HD since March 2021 status post tunneled catheter placement 02/28/2020 and as of 7/1 site unremarkable. Dialysis days: MWF SNF reports patient will need to be able to sit upright in recliner to receive hemodialysis before they will accept him as a patient Unfortunately, this patient continues to decline, he has significant weakness and is unable to tolerate sitting up in a recliner receive outpatient hemodialysis.  Attending nephrologist has documented that patient is not a candidate for outpatient hemodialysis  Chronic normocytic anemia: Due to anemia of chronic renal disease Status post 1 unit of PRBCs Hgb on 7/9 was 8.8 Continue erythropoietin and IV iron per renal.  History of nonhemorrhagic CVA: CT of the head showed atrophy and chronic microvascular disease. 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 Documented as full-thickness tissue loss  Severe protein caloric malnutrition: Tube feedings on hold due to concerns of a possible aspiration as above.  Patient is dialysis patient but will allow D5 normal saline at 30 cc/h to replace tube feeding volume loss Will allow medications per tube with small amounts of flush Nutrition Status: Nutrition Problem: Severe Malnutrition Etiology: chronic illness (chronic respiratory failure related to COVID ARDS) Signs/Symptoms: severe muscle depletion, severe fat depletion Interventions: Prostat, Tube feeding, Juven-continue tube feeding and check residuals every 4 hours      Data Reviewed: Basic Metabolic Panel: Recent Labs  Lab 04/03/20 0636 04/03/20 2033 04/05/20 0506 04/07/20 0654 04/08/20 1031  NA 135 134* 135 134* 134*  K 4.2 3.0* 3.9 3.9 4.0  CL 100 99 101 100 99  CO2 25 26 27 23 27   GLUCOSE 115* 114* 150* 117* 91  BUN 89* 34* 58* 79* 92*  CREATININE 3.06* 1.40* 2.64* 2.57* 3.20*  CALCIUM 9.1 8.2* 9.0  9.4 9.2  MG  --  1.5*  --  2.6*  --   PHOS 5.9* 2.8 4.9*  --  4.4   Liver Function Tests: Recent Labs  Lab 04/03/20 0636 04/03/20 2033 04/05/20 0506 04/07/20 0654 04/08/20 1031  AST  --   --   --  33  --   ALT  --   --   --  20  --   ALKPHOS  --   --   --  110  --   BILITOT  --   --   --  0.5  --   PROT  --   --   --  7.4  --   ALBUMIN 1.8* 2.5* 2.1* 2.1* 1.9*   No results for input(s): LIPASE, AMYLASE in the last 168 hours. No results for input(s): AMMONIA in the last 168 hours. CBC: Recent Labs  Lab 04/03/20 0636 04/05/20 0506 04/07/20 0654 04/08/20 1031  WBC 14.9* 12.9* 16.8* 12.3*  NEUTROABS  --   --  11.3*  --   HGB 9.4* 8.8* 9.4* 8.5*  HCT 30.5* 28.6* 31.3* 28.1*  MCV 81.6 82.9 81.7 80.3  PLT 227 264 275 261   Cardiac Enzymes: No results for input(s): CKTOTAL, CKMB, CKMBINDEX, TROPONINI in the last 168 hours. BNP (last 3 results) No results for input(s): BNP in the last 8760 hours.  ProBNP (last 3 results) No results for input(s): PROBNP in the last 8760 hours.  CBG: Recent Labs  Lab 04/08/20 1709 04/08/20 1947 04/08/20 2321 04/09/20 0314 04/09/20 0722  GLUCAP 122* 171* 139* 151* 147*    Recent Results (from the past 240 hour(s))  Culture, respiratory (non-expectorated)     Status:  None   Collection Time: 04/01/20  9:24 AM   Specimen: Tracheal Aspirate; Respiratory  Result Value Ref Range Status   Specimen Description TRACHEAL ASPIRATE  Final   Special Requests NONE  Final   Gram Stain   Final    RARE WBC PRESENT, PREDOMINANTLY MONONUCLEAR FEW SQUAMOUS EPITHELIAL CELLS PRESENT RARE GRAM NEGATIVE RODS    Culture   Final    FEW PSEUDOMONAS AERUGINOSA MULTI-DRUG RESISTANT ORGANISM RESULT CALLED TO, READ BACK BY AND VERIFIED WITH: IP N COOLEY [phone] MLM Performed at [facility], [place] [address], [place], [place] [address]    Report Status 04/05/2020 FINAL  Final   Organism ID, Bacteria PSEUDOMONAS AERUGINOSA  Final       Susceptibility   Pseudomonas aeruginosa - MIC*    CEFTAZIDIME >=64 RESISTANT Resistant     CIPROFLOXACIN >=4 RESISTANT Resistant     GENTAMICIN 4 SENSITIVE Sensitive     IMIPENEM >=16 RESISTANT Resistant     * FEW PSEUDOMONAS AERUGINOSA     Studies: DG Chest 1 View  Result Date: 04/07/2020 CLINICAL DATA:  Shortness of breath EXAM: CHEST  1 VIEW COMPARISON:  April 04, 2020 FINDINGS: The tracheostomy tube and right dialysis catheter stable. Decreasing interstitial opacities. Persistent but decreased opacity in the left base with probable associated effusion. No other interval changes. IMPRESSION: 1. Support apparatus as above. 2. Decreasing interstitial opacities. 3. Decreasing effusion and opacity in the left base. Electronically Signed   By: Dorise Bullion III M.D   On: 04/07/2020 11:27    Scheduled Meds: . apixaban  2.5 mg Per Tube BID  . chlorhexidine  15 mL Mouth Rinse BID  . chlorhexidine gluconate (MEDLINE KIT)  15 mL Mouth Rinse BID  . Chlorhexidine Gluconate Cloth  6 each Topical Q0600  . Chlorhexidine Gluconate Cloth  6 each Topical Q0600  . darbepoetin (ARANESP) injection - DIALYSIS  100 mcg Intravenous Q Mon-HD  . feeding supplement (PROSource TF)  45 mL Per Tube TID  . fluconazole  100 mg Per Tube Daily  . insulin aspart  0-6 Units Subcutaneous Q4H  . insulin aspart  2 Units Subcutaneous Q4H  . levothyroxine  25 mcg Per Tube Q0600  . lipase/protease/amylase)  20,880 Units Per Tube Once   And  . sodium bicarbonate  650 mg Per Tube Once  . mouth rinse  15 mL Mouth Rinse q12n4p  . metoprolol tartrate  25 mg Per Tube BID  . nutrition supplement (JUVEN)  1 packet Per Tube BID BM  . pantoprazole sodium  40 mg Per Tube Daily  . phenylephrine 200 mcg / ml (NON-ED USE ONLY) injection  100 mcg Intracavernosal Once   Continuous Infusions: . ceftazidime avibactam (AVYCAZ) IVPB 0.94 g (04/08/20 2110)  . dextrose 5 % and 0.9% NaCl 30 mL/hr at 04/08/20 2200  . feeding supplement  (NEPRO CARB STEADY) 1,000 mL (04/08/20 1509)    Principal Problem:   Enterococcal bacteremia Active Problems:   Acute respiratory failure (HCC)   Hemoptysis   Pressure injury of skin   Protein-calorie malnutrition, severe   Decubitus ulcer of sacral region, stage 4 (HCC)   Fever   Palliative care by specialist   Goals of care, counseling/discussion   DNR (do not resuscitate)     Code Status: Full Family Communication: [name] sister 03/29/20, brother [name] on 04/02/20 DVT prophylaxis: Eliquis    Consultants: PCCM Ethics committee/Dr. Andria Frames  Procedures: Echocardiogram 1. Left ventricular ejection fraction, by estimation, is  60 to 65%. The  left ventricle has normal function. The left ventricle has no regional  wall motion abnormalities. There is severe left ventricular hypertrophy.  Left ventricular diastolic parameters  are consistent with Grade I diastolic dysfunction (impaired relaxation).  2. Right ventricular systolic function is normal. The right ventricular  size is normal. There is mildly elevated pulmonary artery systolic  pressure.  3. Left atrial size was mildly dilated.  4. Right atrial size was mildly dilated.  5. The mitral valve is grossly normal. Trivial mitral valve  regurgitation.  6. The aortic valve is tricuspid. Aortic valve regurgitation is trivial.   Antibiotics: Anti-infectives (From admission, onward)   Start     Dose/Rate Route Frequency Ordered Stop   04/08/20 1420  ceftazidime-avibactam (AVYCAZ) 0.94 g in dextrose 5 % 50 mL IVPB     Discontinue     0.94 g 25 mL/hr over 2 Hours Intravenous Daily at bedtime 04/08/20 1420     04/07/20 1800  ceftazidime-avibactam (AVYCAZ) 0.94 g in dextrose 5 % 50 mL IVPB  Status:  Discontinued        0.94 g 25 mL/hr over 2 Hours Intravenous Every 12 hours 04/07/20 1322 04/08/20 1420   04/05/20 1000  fluconazole (DIFLUCAN) 40 MG/ML suspension 100 mg     Discontinue    "Followed by" Linked  Group Details   100 mg Per Tube Daily 04/04/20 2203     04/04/20 2300  fluconazole (DIFLUCAN) 40 MG/ML suspension 200 mg       "Followed by" Linked Group Details   200 mg Per Tube  Once 04/04/20 2203 04/04/20 2325   04/01/20 1013  vancomycin (VANCOCIN) 500-5 MG/100ML-% IVPB       Note to Pharmacy: Cherylann Banas   : cabinet override      04/01/20 1013 04/01/20 1253   03/29/20 1552  vancomycin (VANCOCIN) 500-5 MG/100ML-% IVPB       Note to Pharmacy: Anthonette Legato, Melisa   : cabinet override      03/29/20 1552 03/29/20 1556   03/29/20 1415  fluconazole (DIFLUCAN) 40 MG/ML suspension 100 mg        100 mg Oral Daily 03/29/20 1408 03/31/20 1015   03/28/20 2000  meropenem (MERREM) 500 mg in sodium chloride 0.9 % 100 mL IVPB        500 mg 200 mL/hr over 30 Minutes Intravenous Every 24 hours 03/28/20 1623 04/04/20 2201   03/25/20 1800  cefTAZidime (FORTAZ) 1 g in sodium chloride 0.9 % 100 mL IVPB  Status:  Discontinued       "Followed by" Linked Group Details   1 g 200 mL/hr over 30 Minutes Intravenous Every 24 hours 03/18/20 1015 03/28/20 1613   03/22/20 0830  vancomycin (VANCOREADY) IVPB 500 mg/100 mL        500 mg 100 mL/hr over 60 Minutes Intravenous  Once 03/22/20 0827 03/22/20 1014   03/20/20 1200  vancomycin (VANCOCIN) IVPB 500 mg/100 ml premix        500 mg 100 mL/hr over 60 Minutes Intravenous Every M-W-F (Hemodialysis) 03/19/20 0941 04/03/20 1500   03/18/20 1800  meropenem (MERREM) 500 mg in sodium chloride 0.9 % 100 mL IVPB       "Followed by" Linked Group Details   500 mg 200 mL/hr over 30 Minutes Intravenous Every 24 hours 03/18/20 1015 03/25/20 0026   03/18/20 1200  vancomycin (VANCOCIN) IVPB 500 mg/100 ml premix        500 mg 100 mL/hr over 60  Minutes Intravenous Every M-W-F (Hemodialysis) 03/18/20 0938 03/18/20 1700   03/18/20 1200  cefTAZidime (FORTAZ) 2 g in sodium chloride 0.9 % 100 mL IVPB  Status:  Discontinued        2 g 200 mL/hr over 30 Minutes Intravenous Every Mon  (Hemodialysis) 03/18/20 0939 03/18/20 1015   03/07/20 1800  cefTAZidime (FORTAZ) 2 g in sodium chloride 0.9 % 100 mL IVPB  Status:  Discontinued        2 g 200 mL/hr over 30 Minutes Intravenous Every T-Th-Sa (1800) 03/06/20 1255 03/18/20 1015   03/07/20 1200  vancomycin (VANCOCIN) IVPB 500 mg/100 ml premix  Status:  Discontinued        500 mg 100 mL/hr over 60 Minutes Intravenous Every T-Th-Sa (Hemodialysis) 03/06/20 1255 03/19/20 0941   03/04/20 2000  cefTAZidime (FORTAZ) 2 g in sodium chloride 0.9 % 100 mL IVPB  Status:  Discontinued        2 g 200 mL/hr over 30 Minutes Intravenous Every M-W-F (2000) 03/01/20 1136 03/06/20 1255   03/02/20 1200  vancomycin (VANCOCIN) IVPB 500 mg/100 ml premix        500 mg 100 mL/hr over 60 Minutes Intravenous Every T-Th-Sa (Hemodialysis) 03/01/20 1131 03/02/20 1710   03/01/20 1230  vancomycin (VANCOCIN) IVPB 500 mg/100 ml premix  Status:  Discontinued        500 mg 100 mL/hr over 60 Minutes Intravenous Every M-W-F (Hemodialysis) 03/01/20 1131 03/06/20 1255   02/28/20 1110  vancomycin (VANCOREADY) IVPB 500 mg/100 mL        over 60 Minutes  Continuous PRN 02/28/20 1112 02/28/20 1110   02/28/20 1101  vancomycin (VANCOCIN) 1-5 GM/200ML-% IVPB  Status:  Discontinued       Note to Pharmacy: Lytle Butte   : cabinet override      02/28/20 1101 02/28/20 1327   02/27/20 1800  cefTAZidime (FORTAZ) 1 g in sodium chloride 0.9 % 100 mL IVPB        1 g 200 mL/hr over 30 Minutes Intravenous Every 24 hours 02/27/20 0947 03/03/20 1738   02/27/20 1200  vancomycin (VANCOCIN) IVPB 500 mg/100 ml premix        500 mg 100 mL/hr over 60 Minutes Intravenous Once 02/27/20 0829 02/27/20 1343   02/24/20 1800  vancomycin (VANCOREADY) IVPB 500 mg/100 mL        500 mg 100 mL/hr over 60 Minutes Intravenous  Once 02/24/20 1308 02/24/20 1905   02/22/20 2200  metroNIDAZOLE (FLAGYL) tablet 500 mg  Status:  Discontinued        500 mg Per Tube Every 8 hours 02/22/20 0957 03/30/20 1057    02/22/20 1700  cefTRIAXone (ROCEPHIN) 2 g in sodium chloride 0.9 % 100 mL IVPB  Status:  Discontinued        2 g 200 mL/hr over 30 Minutes Intravenous Every 24 hours 02/22/20 0957 02/27/20 0947   02/22/20 1200  vancomycin (VANCOCIN) IVPB 500 mg/100 ml premix  Status:  Discontinued        500 mg 100 mL/hr over 60 Minutes Intravenous Every T-Th-Sa (Hemodialysis) 02/20/20 1253 02/21/20 1040   02/21/20 1431  vancomycin variable dose per unstable renal function (pharmacist dosing)  Status:  Discontinued         Does not apply See admin instructions 02/21/20 1431 03/01/20 1131   02/21/20 1200  vancomycin (VANCOREADY) IVPB 500 mg/100 mL        500 mg 100 mL/hr over 60 Minutes Intravenous  Once 02/21/20 0956 02/21/20  1253   02/21/20 0714  vancomycin (VANCOCIN) 500-5 MG/100ML-% IVPB  Status:  Discontinued       Note to Pharmacy: Ashley Akin   : cabinet override      02/21/20 0714 02/21/20 1346   02/20/20 1400  vancomycin (VANCOREADY) IVPB 1250 mg/250 mL        1,250 mg 166.7 mL/hr over 90 Minutes Intravenous  Once 02/20/20 1253 02/20/20 1538   02/20/20 1200  levofloxacin (LEVAQUIN) IVPB 500 mg  Status:  Discontinued        500 mg 100 mL/hr over 60 Minutes Intravenous Every 48 hours 02/20/20 1020 02/21/20 0943   02/15/20 1545  vancomycin (VANCOCIN) IVPB 1000 mg/200 mL premix  Status:  Discontinued       "Followed by" Linked Group Details   1,000 mg 200 mL/hr over 60 Minutes Intravenous Every 24 hours 02/14/20 1530 02/14/20 1745   02/15/20 0330  ceFEPIme (MAXIPIME) 2 g in sodium chloride 0.9 % 100 mL IVPB  Status:  Discontinued        2 g 200 mL/hr over 30 Minutes Intravenous Every 12 hours 02/14/20 1531 02/14/20 1531   02/14/20 1531  ceFEPIme (MAXIPIME) 2 g in sodium chloride 0.9 % 100 mL IVPB  Status:  Discontinued        2 g 200 mL/hr over 30 Minutes Intravenous Every 12 hours 02/14/20 1531 02/14/20 1745   02/14/20 1530  ceFEPIme (MAXIPIME) 2 g in sodium chloride 0.9 % 100 mL IVPB   Status:  Discontinued        2 g 200 mL/hr over 30 Minutes Intravenous  Once 02/14/20 1520 02/14/20 1531   02/14/20 1530  vancomycin (VANCOREADY) IVPB 1500 mg/300 mL  Status:  Discontinued       "Followed by" Linked Group Details   1,500 mg 150 mL/hr over 120 Minutes Intravenous  Once 02/14/20 1530 02/14/20 1745        Time spent: 25 minutes    Erin Hearing ANP [facility] Pager [phone]. If 7PM-7AM, please contact night-coverage at [email or web address] 04/09/2020, 10:48 AM  LOS: 55 days

## 2020-04-09 NOTE — Progress Notes (Signed)
Occupational Therapy Treatment and Discharge Patient Details Name: Shane Banks MRN: [id number] DOB: November 26, 1945 Today's Date: 04/09/2020    History of present illness 74 yo m with PMHx of afib with RVR, BPH, stroke, and COVID ARDS in January requiring trach and LTACH placement at [facility]. Had been tolerating trach collar, but on 5/19 developed hemoptysis and hypoxemic respiratory failure requiring vent. Transferred to [facility] on 5/19.  5/19 back on vent, [facility]   OT comments  OT follow-up for positioning to maximize comfort, decrease swelling, and maintain joint integrity. Pt minimally engaged with OT today with decreased attempts to respond and unable to follow movement commands. Pt noted with B UE off of pillows and B hands swollen with L > R. OT provided PROM stretching to B UE within pt's tolerance with increased stiffness noted, edema as a limiting factor in ROM. Most notably, R elbow in hyperextension at this time, unable to stretch even to neutral. Provided additional pillow supports for B UE and reinforced to nursing staff need for daily PROM as pt will tolerate and to keep B UE elevated on pillows. At this time, OT to sign off as pt has reached highest potential with skilled services at acute level. Please reconsult if needed.    Follow Up Recommendations  SNF;LTACH;Supervision/Assistance - 24 hour    Equipment Recommendations  Other (comment) (TBD)    Recommendations for Other Services      Precautions / Restrictions Precautions Precautions: Fall;Other (comment) Precaution Comments: trach collar, PEG (feedings paused during mobility) Restrictions Weight Bearing Restrictions: No       Mobility Bed Mobility                  Transfers                      Balance                                           ADL either performed or assessed with clinical judgement   ADL                                          General ADL Comments: Pt continues with Total A for all ADLs     Vision   Vision Assessment?: Vision impaired- to be further tested in functional context;Yes   Perception     Praxis      Cognition Arousal/Alertness: Lethargic Behavior During Therapy: Flat affect Overall Cognitive Status: Difficult to assess                                 General Comments: minimal engagement with OT. Unable to follow commands for movement of extremities. Did attempt to vocalize to therapist but decreased strength and secretions inhibited communication        Exercises     Shoulder Instructions       General Comments Pt with labored breathing during session, RR up to 30 at times, HR and SpO2 WFL. Pt noted with B UE off of pillows, increased edema in B hands with L > R. Provided gentle PROM, but increased difficulty noted today with R elbow hyperextended and unable to extend to  neutral. Difficult to prop R UE up on pillows due to this. Located additional pillow for L UE. Reinforced need for PROM stretching and elevation of B UE to maximize comfort    Pertinent Vitals/ Pain       Pain Assessment: Faces Faces Pain Scale: Hurts little more Pain Location: B UE during PROM Pain Descriptors / Indicators: Grimacing;Discomfort Pain Intervention(s): Limited activity within patient's tolerance;Monitored during session;Repositioned  Home Living                                          Prior Functioning/Environment              Frequency  Min 1X/week        Progress Toward Goals  OT Goals(current goals can now be found in the care plan section)  Progress towards OT goals: Not progressing toward goals - comment (Has reaching maximum potential, OT to DC)  Acute Rehab OT Goals Patient Stated Goal: not stated OT Goal Formulation: Patient unable to participate in goal setting Time For Goal Achievement: 04/09/20 Potential to Achieve Goals:  Fair ADL Goals Pt Will Perform Upper Body Bathing: with max assist;bed level Additional ADL Goal #1: Demonstrate increased of R shoulder P/AAROM to 75 degrees after using mobilization techniques to increase functional use RUE Additional ADL Goal #2: Pt will complete hand to mouth pattern with mod A using L UE inpreparation for ADL tasks Additional ADL Goal #3: pt will tolerate bilateral resting hand splints 4 hours on/off schedule to maintain a stretch in hands and increase functional use. Additional ADL Goal #4: Caregivers to demonstrate appropriate positioning techniques to preserve joint integrity, prevent contracture progression and decrease swelling  Plan Discharge plan remains appropriate    Co-evaluation                 AM-PAC OT "6 Clicks" Daily Activity     Outcome Measure   Help from another person eating meals?: Total Help from another person taking care of personal grooming?: Total Help from another person toileting, which includes using toliet, bedpan, or urinal?: Total Help from another person bathing (including washing, rinsing, drying)?: Total Help from another person to put on and taking off regular upper body clothing?: Total Help from another person to put on and taking off regular lower body clothing?: Total 6 Click Score: 6    End of Session Equipment Utilized During Treatment: Oxygen  OT Visit Diagnosis: Other abnormalities of gait and mobility (R26.89);Muscle weakness (generalized) (M62.81);Other symptoms and signs involving cognitive function;Pain   Activity Tolerance Patient limited by fatigue;Patient limited by lethargy   Patient Left in bed;with call bell/phone within reach   Nurse Communication Other (comment) (OT DC, positioning)        Time: 1027-2536 OT Time Calculation (min): 17 min  Charges: OT General Charges $OT Visit: 1 Visit OT Treatments $Therapeutic Activity: 8-22 mins  Layla Maw, OTR/L   Layla Maw 04/09/2020, 2:05  PM

## 2020-04-10 LAB — CBC
HCT: 30 % — ABNORMAL LOW (ref 39.0–52.0)
Hemoglobin: 9.1 g/dL — ABNORMAL LOW (ref 13.0–17.0)
MCH: 24.1 pg — ABNORMAL LOW (ref 26.0–34.0)
MCHC: 30.3 g/dL (ref 30.0–36.0)
MCV: 79.6 fL — ABNORMAL LOW (ref 80.0–100.0)
Platelets: 265 10*3/uL (ref 150–400)
RBC: 3.77 MIL/uL — ABNORMAL LOW (ref 4.22–5.81)
RDW: 17.6 % — ABNORMAL HIGH (ref 11.5–15.5)
WBC: 13.7 10*3/uL — ABNORMAL HIGH (ref 4.0–10.5)
nRBC: 0 % (ref 0.0–0.2)

## 2020-04-10 LAB — RENAL FUNCTION PANEL
Albumin: 1.9 g/dL — ABNORMAL LOW (ref 3.5–5.0)
Anion gap: 9 (ref 5–15)
BUN: 91 mg/dL — ABNORMAL HIGH (ref 8–23)
CO2: 26 mmol/L (ref 22–32)
Calcium: 9.3 mg/dL (ref 8.9–10.3)
Chloride: 96 mmol/L — ABNORMAL LOW (ref 98–111)
Creatinine, Ser: 2.73 mg/dL — ABNORMAL HIGH (ref 0.61–1.24)
GFR calc Af Amer: 26 mL/min — ABNORMAL LOW (ref >60)
GFR calc non Af Amer: 22 mL/min — ABNORMAL LOW (ref >60)
Glucose, Bld: 131 mg/dL — ABNORMAL HIGH (ref 70–99)
Phosphorus: 4.4 mg/dL (ref 2.5–4.6)
Potassium: 3.9 mmol/L (ref 3.5–5.1)
Sodium: 131 mmol/L — ABNORMAL LOW (ref 135–145)

## 2020-04-10 LAB — GLUCOSE, CAPILLARY
Glucose-Capillary: 124 mg/dL — ABNORMAL HIGH (ref 70–99)
Glucose-Capillary: 133 mg/dL — ABNORMAL HIGH (ref 70–99)
Glucose-Capillary: 142 mg/dL — ABNORMAL HIGH (ref 70–99)
Glucose-Capillary: 125 mg/dL — ABNORMAL HIGH (ref 70–99)
Glucose-Capillary: 159 mg/dL — ABNORMAL HIGH (ref 70–99)

## 2020-04-10 MED ORDER — DEXTROSE 5 % IV SOLN
0.9400 g | Freq: Two times a day (BID) | INTRAVENOUS | Status: DC
  Administered 2020-04-10: 0.94 g via INTRAVENOUS
  Filled 2020-04-10 (×4): qty 4.51

## 2020-04-10 MED ORDER — CHOLESTYRAMINE 4 G PO PACK
4.0000 g | PACK | Freq: Two times a day (BID) | ORAL | Status: DC
  Administered 2020-04-10 – 2020-04-24 (×28): 4 g
  Filled 2020-04-10 (×31): qty 1

## 2020-04-10 NOTE — Progress Notes (Addendum)
[facility] PROGRESS NOTE  Shane Banks UKG:[id number] DOB: 1946/05/29 DOA: 02/14/2020 PCP: [name], MD  Status: Inpatient--Remains inpatient appropriate because :Unsafe d/c plan, IV treatments appropriate due to intensity of illness or inability to take PO and Inpatient level of care appropriate due to severity of illness-patient continues to have tenuous respiratory status   Dispo:  Patient From:  Home  Planned Disposition:    Expected discharge date:    Medically stable for discharge: No-needs to transition to cuffless trach (respiratory status too tenuous to accomplish), needs to be able to sit upright in recliner to pursue dialysis in the outpatient setting (profoundly physically deconditioned and unable to even lift arms at this juncture), -remains on IV antibiotics secondary to sacral osteomyelitis and decubitus and recent bacteremia with last dose due on 7/8  **7/14 EOL discussion held with patient's sister and POA by telephone with assistance of ethics physician Dr. Andria Frames.  Discussion lasted 36 minutes.  Due to scheduling conflicts other members of patient's family that had plan to attend were unable to.  Please refer to Dr. Lowella Bandy dictated note for details.  Sister verbalizes understanding of poor prognosis.  She was given "permission" to not have to make a decision and instead end-of-life discussion was framed as recommendations from the primary care team that patient has reached end-of-life and nothing further can be done to promote his comfort or recovery therefore the decision has been made for her and the family and they just need to be in agreement with this plan.  She plans to discuss with other family members.  She is aware that once we focus on comfort that hemodialysis will be withdrawn and this will hasten patient's death.  Once a decision has been made by the family details regarding how we will proceed and expectations of the dying process will be discussed with  patient's sister and family as well.  I will call her again on 7/15 prior to 4 PM to determine if any decisions have been made.  Dr. Andria Frames also encouraged any family members who have yet to visit with the patient and spend time with him make plans to do so over the next several days in the event comfort measures are instituted.  HPI: 74 y.o. 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 Patient treated for Covid pneumonia January 2021 and had been on the vent because of progressive ARDS.  Because of lack of progression he underwent tracheostomy and was [facility].  Was sent back to this facility in May due to trach site bleeding suspected hemoptysis.  He was also noted with sacral decubitus at time of presentation in May.  Since admission he has been treated for recurrent multidrug-resistant Pseudomonas pneumonia and MRSA and enterococcal bacteremia.  On 03/15/2020 patient returned to the ICU for ventilatory and pressor support he developed septic shock and developed acute renal failure requiring dialysis.  He developed sepsis physiology likely secondary to sacral decubitus ulcer with underlying osteomyelitis.    He has been weaned from pressors. PCCM is following for trach weaning with the assistance of SLP.  Patient was liberated from the vent on 03/21/2020 and was transferred to the floor in 03/22/2020.  Due to the lack of insurance approval he cannot go back to [facility].  Social worker is working on Clinical research associate.  He will need to be transition to a cuffless trach which unfortunately has not been accomplished due to recurrent [name] with respiratory failure physiology.  He  has been deemed to not be a candidate for outpatient hemodialysis by the nephrologist.  Patient has not made any progress in mobility OT and PT have signed off.  On the morning of 7/6 patient developed hypoxemia and increased work of breathing with shallow respiratory effort, requiring frequent suctioning.  O2 sats decreased into the 70s but improved to 95% with bagging and suctioning.  Briefly required increase of FiO2 to 100% but was eventually decreased back to 40%.  Tracheal aspirate appeared consistent with tube feedings therefore tube feedings were stopped.  He was found to have multidrug-resistant Pseudomonas once again only sensitive to gent this time.  Concerns that this may be related to colonization with recommendation from ID to follow.  Patient later developed fevers within 24 hours after that recommendation on the evening of 7/9 so at the recommendation of infectious disease service given multidrug-resistant Pseudomonas recommendation was started on Fortaz plus [name].  Subjective: Alert and awake. Occasionally mouths words which are unintelligible and possibly is nodding appropriately to questions asked.  Objective: Vitals:   04/10/20 0721 04/10/20 0945  BP:    Pulse: 95 87  Resp: (!) 28   Temp:    SpO2: 97% 96%    Intake/Output Summary (Last 24 hours) at 04/10/2020 1126 Last data filed at 04/09/2020 1953 Gross per 24 hour  Intake 779.67 ml  Output 1700 ml  Net -920.33 ml   Filed Weights   04/08/20 0943 04/08/20 1320 04/09/20 0326  Weight: 65 kg 62.5 kg 71 kg    Exam:  Constitutional: NAD, calm and alert today, nodding appropriately to questions asked, appears to be comfortable at this time Neck: normal, supple, no masses-midline cuffed trach in place  Respiratory: Bilateral lung sounds coarse on auscultation without obvious rhonchi or wheezing.  Stable, chronic tachypneic but without significant increased work of breathing, trach collar oxygen/FiO2  28% Cardiovascular: Irregular rate with underlying rhythm atrial flutter rate controlled in the 80s to 90s, no murmurs / rubs / gallops.  Trace upper extremity edema improving.. 2+ pedal pulses. No carotid bruits.  Dialysis catheter right subclavian vein. Abdomen: no tenderness, no masses palpated. No hepatosplenomegaly. Bowel sounds positive.  Colostomy in place with light brown watery stool.  Gastrostomy tube with tube feeding GU: Condom catheter in place draining yellow-colored urine to bedside bag Musculoskeletal: no clubbing / cyanosis. No joint deformity upper and lower extremities.  Not spontaneously moving UEs but with PROM has adequate range of motion. Extension contracture involving the right wrist and right elbow with about 5% ability to flex with AROM. Diminished muscle tone throughout all extremities and too weak to follow commands with upper extremities/unable to lift extremities upon command  Skin: no rashes, lesions, sacral decubitus not examined today Neurologic: CN 2-12 grossly intact. 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  PCCM following periodically to assess readiness for transition cuffless trach. 7/6: Significant respiratory decompensation with FiO2 increased up to 60%. Aspiration and  recurrent Pseudomonas pneumonia as etiology. Of note, family refusing further conversations with palliative medicine team.  Contacted ethics team 7/9 and tentative meeting with family planned for Wednesday 7/14 at 4 PM-modality will be  teleconference Has completed 8 doses of IV meropenem as of 7/9.   Repeat sputum culture obtained prior to initiation of meropenem has finalized with few MDR Pseudomonas (resistant to imipenem/Carbapenem's but sensitive to gentamicin).   Patient developed low-grade fever on 7/9-discussed with ID who recommended Fortaz with [name] 7/12: WBC down to 12.3 but has of 7/14 WBC back up to 13.7-afebrile and stable on lower level of oxygen at 28% Unfortunately given recurrent pneumonia and increased secretions with associated generalized weakness/weak cough it is highly doubtful he can transition to cuffless trach at this point.  Overall prognosis is poor.  Continue chest physiotherapy with vibra vest administered per RT  Goals of care: Plan is to have telephonic meeting with patient's sister and other family members today on 7/14. Meeting will primarily be guided by the ethics physician Dr. Andria Frames with myself in attendance. Dr. Jonnie Finner with nephrology has also been made aware of meeting and will try to attend today  Diarrhea Likely noninfectious in etiology-C. difficile negative Suspect related to antibiotics and other medications bolus tube feeding Continues to have watery stools per colostomy 7/14 we will add twice daily cholestyramine Continue maintenance fluids at 30 cc/h  Mild funguria/oral yeast Urine culture with 30,000 colonies of yeast Continue Diflucan-had started last week but fell off medication list and was resumed on 7/8  Sacral osteomyelitis and HD catheter sepsis/  Enterococcal bacteremia: Tunneled catheter removal on 02/21/2020. Has tunneled HD catheter right subclavian-side unremarkable Pleated 6 weeks of IV vancomycin and Flagyl 7/8  Paroxysmal atrial fibrillation/Acquired thrombophilia: Currently rate controlled on metoprolol Of 7/13 rate controlled atrial flutter Anticoagulation previously discontinued due to suspected GI bleed and was resumed 5/31 and patient has  tolerated it any signs of bleeding.  Chronic kidney disease stage V now requiring hemodialysis 2/2 ATN and AKI from IV gentamicin: On HD since March 2021 status post tunneled catheter placement 02/28/2020 and as of 7/1 site unremarkable. Dialysis days: MWF SNF reported patient will need to be able to sit upright in recliner to receive hemodialysis before they will accept him as a patient-patient has been unable to meet this goal and is not making any progress in regards to physical abilities-OT/PT has signed off Unfortunately, this patient continues to decline, he has significant weakness and is unable to tolerate sitting up in a recliner receive outpatient hemodialysis.  Attending nephrologist has documented that patient is not a candidate for outpatient hemodialysis  Chronic normocytic anemia: Due to anemia of chronic renal disease Status post 1 unit of PRBCs Hgb on 7/9 was 8.8 Continue erythropoietin and IV iron per renal.  History of nonhemorrhagic CVA: CT of the head showed atrophy and chronic microvascular disease. 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 Documented as full-thickness tissue loss  Severe protein caloric malnutrition: Tube feedings on hold due to concerns of a possible aspiration as above.  Patient is dialysis patient but will allow D5 normal saline at 30 cc/h to replace tube feeding volume loss Will allow medications per tube with small amounts of flush Nutrition Status: Nutrition Problem: Severe Malnutrition Etiology: chronic illness (chronic respiratory failure related to COVID ARDS) Signs/Symptoms: severe muscle depletion, severe fat depletion Interventions: Prostat, Tube feeding, Juven-continue tube feeding and check residuals every 4 hours  Data Reviewed: Basic Metabolic Panel: Recent Labs  Lab 04/03/20 2033 04/05/20 0506 04/07/20 0654 04/08/20 1031  04/10/20 1010  NA 134* 135 134* 134* 131*  K 3.0* 3.9 3.9 4.0 3.9  CL 99 101 100 99 96*  CO2 26 27 23 27 26   GLUCOSE 114* 150* 117* 91 131*  BUN 34* 58* 79* 92* 91*  CREATININE 1.40* 2.64* 2.57* 3.20* 2.73*  CALCIUM 8.2* 9.0 9.4 9.2 9.3  MG 1.5*  --  2.6*  --   --   PHOS 2.8 4.9*  --  4.4 4.4   Liver Function Tests: Recent Labs  Lab 04/03/20 2033 04/05/20 0506 04/07/20 0654 04/08/20 1031 04/10/20 1010  AST  --   --  33  --   --   ALT  --   --  20  --   --   ALKPHOS  --   --  110  --   --   BILITOT  --   --  0.5  --   --   PROT  --   --  7.4  --   --   ALBUMIN 2.5* 2.1* 2.1* 1.9* 1.9*   No results for input(s): LIPASE, AMYLASE in the last 168 hours. No results for input(s): AMMONIA in the last 168 hours. CBC: Recent Labs  Lab 04/05/20 0506 04/07/20 0654 04/08/20 1031 04/10/20 1010  WBC 12.9* 16.8* 12.3* 13.7*  NEUTROABS  --  11.3*  --   --   HGB 8.8* 9.4* 8.5* 9.1*  HCT 28.6* 31.3* 28.1* 30.0*  MCV 82.9 81.7 80.3 79.6*  PLT 264 275 261 265   Cardiac Enzymes: No results for input(s): CKTOTAL, CKMB, CKMBINDEX, TROPONINI in the last 168 hours. BNP (last 3 results) No results for input(s): BNP in the last 8760 hours.  ProBNP (last 3 results) No results for input(s): PROBNP in the last 8760 hours.  CBG: Recent Labs  Lab 04/09/20 1644 04/09/20 1942 04/09/20 2326 04/10/20 0314 04/10/20 0715  GLUCAP 156* 147* 138* 124* 133*    Recent Results (from the past 240 hour(s))  Culture, respiratory (non-expectorated)     Status: None   Collection Time: 04/01/20  9:24 AM   Specimen: Tracheal Aspirate; Respiratory  Result Value Ref Range Status   Specimen Description TRACHEAL ASPIRATE  Final   Special Requests NONE  Final   Gram Stain   Final    RARE WBC PRESENT, PREDOMINANTLY MONONUCLEAR FEW SQUAMOUS EPITHELIAL CELLS PRESENT RARE GRAM NEGATIVE RODS    Culture   Final    FEW PSEUDOMONAS AERUGINOSA MULTI-DRUG RESISTANT ORGANISM RESULT CALLED TO, READ BACK BY  AND VERIFIED WITH: IP N COOLEY [phone] MLM Performed at [facility], [place] [address], [place], [place] [address]    Report Status 04/05/2020 FINAL  Final   Organism ID, Bacteria PSEUDOMONAS AERUGINOSA  Final      Susceptibility   Pseudomonas aeruginosa - MIC*    CEFTAZIDIME >=64 RESISTANT Resistant     CIPROFLOXACIN >=4 RESISTANT Resistant     GENTAMICIN 4 SENSITIVE Sensitive     IMIPENEM >=16 RESISTANT Resistant     * FEW PSEUDOMONAS AERUGINOSA     Studies: No results found.  Scheduled Meds: . apixaban  2.5 mg Per Tube BID  . chlorhexidine  15 mL Mouth Rinse BID  . chlorhexidine gluconate (MEDLINE KIT)  15 mL Mouth Rinse BID  . Chlorhexidine Gluconate Cloth  6 each Topical Q0600  . cholestyramine  4 g Per Tube BID  .  darbepoetin (ARANESP) injection - DIALYSIS  100 mcg Intravenous Q Mon-HD  . feeding supplement (PROSource TF)  45 mL Per Tube TID  . fluconazole  100 mg Per Tube Daily  . insulin aspart  0-6 Units Subcutaneous Q4H  . insulin aspart  2 Units Subcutaneous Q4H  . levothyroxine  25 mcg Per Tube Q0600  . lipase/protease/amylase)  20,880 Units Per Tube Once   And  . sodium bicarbonate  650 mg Per Tube Once  . mouth rinse  15 mL Mouth Rinse q12n4p  . metoprolol tartrate  25 mg Per Tube BID  . nutrition supplement (JUVEN)  1 packet Per Tube BID BM  . pantoprazole sodium  40 mg Per Tube Daily  . phenylephrine 200 mcg / ml (NON-ED USE ONLY) injection  100 mcg Intracavernosal Once   Continuous Infusions: . ceftazidime avibactam (AVYCAZ) IVPB Stopped (04/10/20 0656)  . dextrose 30 mL/hr at 04/09/20 1724  . feeding supplement (NEPRO CARB STEADY) 1,000 mL (04/10/20 0656)    Principal Problem:   Enterococcal bacteremia Active Problems:   Acute respiratory failure (HCC)   Hemoptysis   Pressure injury of skin   Protein-calorie malnutrition, severe   Decubitus ulcer of sacral region, stage 4 (HCC)   Fever   Palliative care by specialist   Goals of care,  counseling/discussion   DNR (do not resuscitate)     Code Status: Full Family Communication: [name] sister 03/29/20, brother [name] on 04/02/20 DVT prophylaxis: Eliquis    Consultants: PCCM Ethics committee/Dr. Andria Frames  Procedures: Echocardiogram 1. Left ventricular ejection fraction, by estimation, is 60 to 65%. The  left ventricle has normal function. The left ventricle has no regional  wall motion abnormalities. There is severe left ventricular hypertrophy.  Left ventricular diastolic parameters  are consistent with Grade I diastolic dysfunction (impaired relaxation).  2. Right ventricular systolic function is normal. The right ventricular  size is normal. There is mildly elevated pulmonary artery systolic  pressure.  3. Left atrial size was mildly dilated.  4. Right atrial size was mildly dilated.  5. The mitral valve is grossly normal. Trivial mitral valve  regurgitation.  6. The aortic valve is tricuspid. Aortic valve regurgitation is trivial.   Antibiotics: Anti-infectives (From admission, onward)   Start     Dose/Rate Route Frequency Ordered Stop   04/08/20 1420  ceftazidime-avibactam (AVYCAZ) 0.94 g in dextrose 5 % 50 mL IVPB     Discontinue     0.94 g 25 mL/hr over 2 Hours Intravenous Daily at bedtime 04/08/20 1420     04/07/20 1800  ceftazidime-avibactam (AVYCAZ) 0.94 g in dextrose 5 % 50 mL IVPB  Status:  Discontinued        0.94 g 25 mL/hr over 2 Hours Intravenous Every 12 hours 04/07/20 1322 04/08/20 1420   04/05/20 1000  fluconazole (DIFLUCAN) 40 MG/ML suspension 100 mg     Discontinue    "Followed by" Linked Group Details   100 mg Per Tube Daily 04/04/20 2203     04/04/20 2300  fluconazole (DIFLUCAN) 40 MG/ML suspension 200 mg       "Followed by" Linked Group Details   200 mg Per Tube  Once 04/04/20 2203 04/04/20 2325   04/01/20 1013  vancomycin (VANCOCIN) 500-5 MG/100ML-% IVPB       Note to Pharmacy: Cherylann Banas   : cabinet override       04/01/20 1013 04/01/20 1253   03/29/20 1552  vancomycin (VANCOCIN) 500-5 MG/100ML-% IVPB  Note to Pharmacy: Herriott, Melisa   : cabinet override      03/29/20 1552 03/29/20 1556   03/29/20 1415  fluconazole (DIFLUCAN) 40 MG/ML suspension 100 mg        100 mg Oral Daily 03/29/20 1408 03/31/20 1015   03/28/20 2000  meropenem (MERREM) 500 mg in sodium chloride 0.9 % 100 mL IVPB        500 mg 200 mL/hr over 30 Minutes Intravenous Every 24 hours 03/28/20 1623 04/04/20 2201   03/25/20 1800  cefTAZidime (FORTAZ) 1 g in sodium chloride 0.9 % 100 mL IVPB  Status:  Discontinued       "Followed by" Linked Group Details   1 g 200 mL/hr over 30 Minutes Intravenous Every 24 hours 03/18/20 1015 03/28/20 1613   03/22/20 0830  vancomycin (VANCOREADY) IVPB 500 mg/100 mL        500 mg 100 mL/hr over 60 Minutes Intravenous  Once 03/22/20 0827 03/22/20 1014   03/20/20 1200  vancomycin (VANCOCIN) IVPB 500 mg/100 ml premix        500 mg 100 mL/hr over 60 Minutes Intravenous Every M-W-F (Hemodialysis) 03/19/20 0941 04/03/20 1500   03/18/20 1800  meropenem (MERREM) 500 mg in sodium chloride 0.9 % 100 mL IVPB       "Followed by" Linked Group Details   500 mg 200 mL/hr over 30 Minutes Intravenous Every 24 hours 03/18/20 1015 03/25/20 0026   03/18/20 1200  vancomycin (VANCOCIN) IVPB 500 mg/100 ml premix        500 mg 100 mL/hr over 60 Minutes Intravenous Every M-W-F (Hemodialysis) 03/18/20 0938 03/18/20 1700   03/18/20 1200  cefTAZidime (FORTAZ) 2 g in sodium chloride 0.9 % 100 mL IVPB  Status:  Discontinued        2 g 200 mL/hr over 30 Minutes Intravenous Every Mon (Hemodialysis) 03/18/20 0939 03/18/20 1015   03/07/20 1800  cefTAZidime (FORTAZ) 2 g in sodium chloride 0.9 % 100 mL IVPB  Status:  Discontinued        2 g 200 mL/hr over 30 Minutes Intravenous Every T-Th-Sa (1800) 03/06/20 1255 03/18/20 1015   03/07/20 1200  vancomycin (VANCOCIN) IVPB 500 mg/100 ml premix  Status:  Discontinued        500  mg 100 mL/hr over 60 Minutes Intravenous Every T-Th-Sa (Hemodialysis) 03/06/20 1255 03/19/20 0941   03/04/20 2000  cefTAZidime (FORTAZ) 2 g in sodium chloride 0.9 % 100 mL IVPB  Status:  Discontinued        2 g 200 mL/hr over 30 Minutes Intravenous Every M-W-F (2000) 03/01/20 1136 03/06/20 1255   03/02/20 1200  vancomycin (VANCOCIN) IVPB 500 mg/100 ml premix        500 mg 100 mL/hr over 60 Minutes Intravenous Every T-Th-Sa (Hemodialysis) 03/01/20 1131 03/02/20 1710   03/01/20 1230  vancomycin (VANCOCIN) IVPB 500 mg/100 ml premix  Status:  Discontinued        500 mg 100 mL/hr over 60 Minutes Intravenous Every M-W-F (Hemodialysis) 03/01/20 1131 03/06/20 1255   02/28/20 1110  vancomycin (VANCOREADY) IVPB 500 mg/100 mL        over 60 Minutes  Continuous PRN 02/28/20 1112 02/28/20 1110   02/28/20 1101  vancomycin (VANCOCIN) 1-5 GM/200ML-% IVPB  Status:  Discontinued       Note to Pharmacy: Lytle Butte   : cabinet override      02/28/20 1101 02/28/20 1327   02/27/20 1800  cefTAZidime (FORTAZ) 1 g in sodium chloride 0.9 %  100 mL IVPB        1 g 200 mL/hr over 30 Minutes Intravenous Every 24 hours 02/27/20 0947 03/03/20 1738   02/27/20 1200  vancomycin (VANCOCIN) IVPB 500 mg/100 ml premix        500 mg 100 mL/hr over 60 Minutes Intravenous Once 02/27/20 0829 02/27/20 1343   02/24/20 1800  vancomycin (VANCOREADY) IVPB 500 mg/100 mL        500 mg 100 mL/hr over 60 Minutes Intravenous  Once 02/24/20 1308 02/24/20 1905   02/22/20 2200  metroNIDAZOLE (FLAGYL) tablet 500 mg  Status:  Discontinued        500 mg Per Tube Every 8 hours 02/22/20 0957 03/30/20 1057   02/22/20 1700  cefTRIAXone (ROCEPHIN) 2 g in sodium chloride 0.9 % 100 mL IVPB  Status:  Discontinued        2 g 200 mL/hr over 30 Minutes Intravenous Every 24 hours 02/22/20 0957 02/27/20 0947   02/22/20 1200  vancomycin (VANCOCIN) IVPB 500 mg/100 ml premix  Status:  Discontinued        500 mg 100 mL/hr over 60 Minutes Intravenous Every  T-Th-Sa (Hemodialysis) 02/20/20 1253 02/21/20 1040   02/21/20 1431  vancomycin variable dose per unstable renal function (pharmacist dosing)  Status:  Discontinued         Does not apply See admin instructions 02/21/20 1431 03/01/20 1131   02/21/20 1200  vancomycin (VANCOREADY) IVPB 500 mg/100 mL        500 mg 100 mL/hr over 60 Minutes Intravenous  Once 02/21/20 0956 02/21/20 1253   02/21/20 0714  vancomycin (VANCOCIN) 500-5 MG/100ML-% IVPB  Status:  Discontinued       Note to Pharmacy: Ashley Akin   : cabinet override      02/21/20 0714 02/21/20 1346   02/20/20 1400  vancomycin (VANCOREADY) IVPB 1250 mg/250 mL        1,250 mg 166.7 mL/hr over 90 Minutes Intravenous  Once 02/20/20 1253 02/20/20 1538   02/20/20 1200  levofloxacin (LEVAQUIN) IVPB 500 mg  Status:  Discontinued        500 mg 100 mL/hr over 60 Minutes Intravenous Every 48 hours 02/20/20 1020 02/21/20 0943   02/15/20 1545  vancomycin (VANCOCIN) IVPB 1000 mg/200 mL premix  Status:  Discontinued       "Followed by" Linked Group Details   1,000 mg 200 mL/hr over 60 Minutes Intravenous Every 24 hours 02/14/20 1530 02/14/20 1745   02/15/20 0330  ceFEPIme (MAXIPIME) 2 g in sodium chloride 0.9 % 100 mL IVPB  Status:  Discontinued        2 g 200 mL/hr over 30 Minutes Intravenous Every 12 hours 02/14/20 1531 02/14/20 1531   02/14/20 1531  ceFEPIme (MAXIPIME) 2 g in sodium chloride 0.9 % 100 mL IVPB  Status:  Discontinued        2 g 200 mL/hr over 30 Minutes Intravenous Every 12 hours 02/14/20 1531 02/14/20 1745   02/14/20 1530  ceFEPIme (MAXIPIME) 2 g in sodium chloride 0.9 % 100 mL IVPB  Status:  Discontinued        2 g 200 mL/hr over 30 Minutes Intravenous  Once 02/14/20 1520 02/14/20 1531   02/14/20 1530  vancomycin (VANCOREADY) IVPB 1500 mg/300 mL  Status:  Discontinued       "Followed by" Linked Group Details   1,500 mg 150 mL/hr over 120 Minutes Intravenous  Once 02/14/20 1530 02/14/20 1745        Time  spent: 25  minutes    Erin Hearing ANP [facility] Pager [phone]. If 7PM-7AM, please contact night-coverage at [email or web address] 04/10/2020, 11:26 AM  LOS: 56 days

## 2020-04-10 NOTE — Progress Notes (Signed)
PHARMACY NOTE:  ANTIMICROBIAL RENAL DOSAGE ADJUSTMENT  Current antimicrobial regimen includes a mismatch between antimicrobial dosage and estimated renal function.  As per policy approved by the Pharmacy & Therapeutics and Medical Executive Committees, the antimicrobial dosage will be adjusted accordingly.  Current antimicrobial dosage:  Avycaz 0.94g IV Q24H  Indication: MDR Pseudomonas PNA  Renal Function:  Estimated Creatinine Clearance: 24.5 mL/min (A) (by C-G formula based on SCr of 2.73 mg/dL (H)). []      On intermittent HD, scheduled: []      On CRRT    Antimicrobial dosage has been changed to:  Avycaz 0.94gm IV Q12H  Jamielynn Wigley D. Mina Marble, PharmD, BCPS, [place] 04/10/2020, 8:04 PM

## 2020-04-10 NOTE — Ethics Note (Signed)
Ethics consult/family meeting.  Asked to speak with family by Erin Hearing NP.  Concern is that Mr. Zweber is inevitably dying, will never leave the hospital and that his current treatments contribute to suffering.  Medical team feels it would be prudent to switch to comfort care.  Up until now, family has been resistant to any withdrawal of care.    Mr. Chasen lacks capacity.  It is doubtful that he will ever regain capacity.  Sister, [name], is the family spokesperson and Mr. Marquie Aderhold  The family meeting was between myself, Ms Lissa Merlin and [name].  Other family members had been invited, but schedule conflicts did not allow them to attend.  While she would have preferred that other, [name] felt it best to proceed with our meeting.  After introductions and a brief comment on the role of the ethics consult. Erin Hearing gave an overview of the patient's status. 1. He is still on a cuffed trach and cannot be weaned. 2. He has renal failure.  HD is the thing keeping him alive.  At this point, no one expects any renal recovery. 3. He is too weak to participate in PT and OT.  They have signed off.  Because he would need to be able to sit up for outpatient dialysis, there is no viable discharge option now or in the foreseeable future. 4. Recurrent pneumonia continues to be a problem Summary: The medical team believes that short of a miracle, Mr Kliebert will never leave the hospital.  They also believe the best treatment plan is to switch to comfort care and that is what is recommended to the family.  Ms Winona Legato heard that status report and was not really surprised.  She had hoped that he could be transfer to a facility closer to home where family could visit more easily.  She understood that he was too unstable for transfer.   She had also not heard so clearly that the medical team did not expect Mr. Ciullo would ever leave the hospital.  Mr. Mcshan had been in pretty good health  until he contracted COVID in Dec of 2020.  He has been in the hospital or LTAC almost constantly since then.  The renal failure came in March of 2021 presumed secondary to aminoglycoside therapy.  Regardless, the last 8 months have been very difficult.  Ms Winona Legato describes her brother as having a strong connection to the church.  He had previously served as a Associate Professor.  Sadly, a wedge was driven between the family when he divorced his wife (the mother of his adult children) and began seeing another woman.  Also, sadly, Mr. Winona Legato did not provide a clear indication of his end-of life preferences.  The family is blessed that most of them are still alive - which makes it difficult to infer his end of life choices.  Ms Winona Legato needs to talk to other family members.  She is aware of and understands the reasons why the medical team is recommending a switch to comfort care.  Ms. Winona Legato and NP Lissa Merlin will speak again tomorrow after Ms Winona Legato has been able to update the family.    The only ethical principle that I conveyed to Ms Winona Legato is that she should not say what she wants.  Rather she should say what her brother would want if he could speak.  She understood this.  I reflected that it was clear she loved him and wanted to do everything to keep him  alive.  Then, I brought her back to the central question which is:  The doctors are recommending a switch to comfort care as the best treatment.  Speaking for her brother who has no voice and given the extremely poor prognosis, does she and the other family agree to the medical team's recommendation. Domenic Schwab, MD Cell [phone]

## 2020-04-10 NOTE — Progress Notes (Signed)
[facility] Progress Note  Subjective: seen in room, no new issues  Vitals:   04/10/20 0721 04/10/20 0945 04/10/20 1148 04/10/20 1201  BP:   121/68 121/68  Pulse: 95 87 87 88  Resp: (!) 28  (!) 33 20  Temp:   98.5 F (36.9 C)   TempSrc:   Axillary   SpO2: 97% 96% 95% 99%  Weight:      Height:        Exam: General: Ill-looking male with a tracheostomy, lethargic Heart:RRR, s1s2 nl Lungs: Bibasal coarse breath sound, no wheezing Abdomen:soft, colostomy bag noted.  Also has feeding tube. Extremities: trace edema LE's/ UE's Dialysis Access: Right IJ TDC.   Summary: Pt is a 74 y.o. yo male  who initially came from [facility] to [facility] which included mech ventilation and trach placement. At [facility] had severe infections/ bacteremia/ PNA and developed AKI felt due to IV gent toxicity requiring initiation of HD 12/16/19. Admitted to [facility] for trach bleed on 5/19. Was getting MWF HD at [facility] prior to admit here  Assessment/ Plan: 1. 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 ID recommended Avycaz started 7/11. High risk for need mech ventilation given deconditioned state, ongoing secretions and mucous plugging, per CCM. Ethics meeting planned for today.  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 Monitor closely 3. ESRD - on HD since mid March 2021. HD MWF at [facility] and MWF here. S/P new TDC 6/2 by IR.  High BUN likely d/t catabolic state from infections/ decub/ tissue loss.  Does not need daily notes, will see on MWF. HD today.  4. BP/vol status: mild vol overload, UF as tolerated 5. Atrial fib - per primary, on eliquis 2.5 BID 6. Anemia ckd - transfuse prn, Hb remains 9- 10 range. on ESA 7. Hypercalcemia: w/ low  PTH 29 d/t immobility, improved, cont low Ca bath 8. SP peg/ divert colostomy   Rob Oceana Walthall 04/10/2020, 1:59 PM   Recent Labs  Lab 04/08/20 1031 04/10/20 1010  K 4.0 3.9  BUN 92* 91*  CREATININE 3.20* 2.73*  CALCIUM 9.2 9.3  PHOS 4.4 4.4  HGB 8.5* 9.1*   Inpatient medications: . apixaban  2.5 mg Per Tube BID  . chlorhexidine  15 mL Mouth Rinse BID  . chlorhexidine gluconate (MEDLINE KIT)  15 mL Mouth Rinse BID  . Chlorhexidine Gluconate Cloth  6 each Topical Q0600  . cholestyramine  4 g Per Tube BID  . darbepoetin (ARANESP) injection - DIALYSIS  100 mcg Intravenous Q Mon-HD  . feeding supplement (PROSource TF)  45 mL Per Tube TID  . fluconazole  100 mg Per Tube Daily  . insulin aspart  0-6 Units Subcutaneous Q4H  . insulin aspart  2 Units Subcutaneous Q4H  . levothyroxine  25 mcg Per Tube Q0600  . lipase/protease/amylase)  20,880 Units Per Tube Once   And  . sodium bicarbonate  650 mg Per Tube Once  . mouth rinse  15 mL Mouth Rinse q12n4p  . metoprolol tartrate  25 mg Per Tube BID  . nutrition supplement (JUVEN)  1 packet Per Tube BID BM  . pantoprazole sodium  40 mg Per Tube Daily  . phenylephrine 200 mcg / ml (NON-ED USE ONLY) injection  100 mcg Intracavernosal Once   . ceftazidime avibactam (AVYCAZ) IVPB Stopped (04/10/20  0388)  . dextrose 30 mL/hr at 04/09/20 1724  . feeding supplement (NEPRO CARB STEADY) 1,000 mL (04/10/20 0656)   acetaminophen (TYLENOL) oral liquid 160 mg/5 mL, docusate, fentaNYL (SUBLIMAZE) injection, ipratropium-albuterol, metoprolol tartrate, polyethylene glycol

## 2020-04-10 NOTE — Progress Notes (Signed)
Threasa Alpha, RN has been called and notified that the pt HD treatment has been moved to 04/11/2020.

## 2020-04-10 NOTE — Progress Notes (Signed)
  Speech Language Pathology Treatment: Nada Boozer Speaking valve  Patient Details Name: Shane Banks MRN: [id number] DOB: [birth year] Today's Date: 04/10/2020 Time: 4481-8563 SLP Time Calculation (min) (ACUTE ONLY): 11 min  Assessment / Plan / Recommendation Clinical Impression  Pt appears about the same as last session with this therapist in terms of endurance and deconditioning. He was awake and responsive to questions. PMV donned and exhalatory effort is so diminished he has difficulty moving air to upper airway. When cued to clear throat this is produced with increased vocal intensity but phonation in words x 2. Expiratory muscle strength trainer was used at the lowest setting of 5 cm H2O with little effort but accurate. He completed 8 repetitions before affirming he was a little dizzy. PO trials not attempted this session. Will continue efforts at communication and swallow.    HPI HPI: 74 yo m with PMHx of afib with RVR, BPH, stroke, and COVID ARDS in January requiring trach and LTACH placement at [facility]. Had been tolerating trach collar, but on 5/19 developed hemoptysis and hypoxemic respiratory failure requiring vent. Transferred to [facility] on 5/19. PMSV desired for discussion with pt regarding goals of care.      SLP Plan  Continue with current plan of care       Recommendations         Patient may use Passy-Muir Speech Valve: During all therapies with supervision PMSV Supervision: Full         Oral Care Recommendations: Oral care QID Follow up Recommendations: LTACH;Skilled Nursing facility SLP Visit Diagnosis: Aphonia (R49.1) Plan: Continue with current plan of care       GO                Shane Banks 04/10/2020, 3:56 PM

## 2020-04-11 LAB — CBC
HCT: 28 % — ABNORMAL LOW (ref 39.0–52.0)
Hemoglobin: 8.4 g/dL — ABNORMAL LOW (ref 13.0–17.0)
MCH: 24.1 pg — ABNORMAL LOW (ref 26.0–34.0)
MCHC: 30 g/dL (ref 30.0–36.0)
MCV: 80.5 fL (ref 80.0–100.0)
Platelets: 280 10*3/uL (ref 150–400)
RBC: 3.48 MIL/uL — ABNORMAL LOW (ref 4.22–5.81)
RDW: 17.8 % — ABNORMAL HIGH (ref 11.5–15.5)
WBC: 13.7 10*3/uL — ABNORMAL HIGH (ref 4.0–10.5)
nRBC: 0 % (ref 0.0–0.2)

## 2020-04-11 LAB — GLUCOSE, CAPILLARY
Glucose-Capillary: 91 mg/dL (ref 70–99)
Glucose-Capillary: 115 mg/dL — ABNORMAL HIGH (ref 70–99)
Glucose-Capillary: 120 mg/dL — ABNORMAL HIGH (ref 70–99)
Glucose-Capillary: 131 mg/dL — ABNORMAL HIGH (ref 70–99)
Glucose-Capillary: 139 mg/dL — ABNORMAL HIGH (ref 70–99)
Glucose-Capillary: 113 mg/dL — ABNORMAL HIGH (ref 70–99)
Glucose-Capillary: 127 mg/dL — ABNORMAL HIGH (ref 70–99)

## 2020-04-11 LAB — RENAL FUNCTION PANEL
Albumin: 1.8 g/dL — ABNORMAL LOW (ref 3.5–5.0)
Anion gap: 9 (ref 5–15)
BUN: 116 mg/dL — ABNORMAL HIGH (ref 8–23)
CO2: 25 mmol/L (ref 22–32)
Calcium: 9.3 mg/dL (ref 8.9–10.3)
Chloride: 94 mmol/L — ABNORMAL LOW (ref 98–111)
Creatinine, Ser: 2.99 mg/dL — ABNORMAL HIGH (ref 0.61–1.24)
GFR calc Af Amer: 23 mL/min — ABNORMAL LOW (ref >60)
GFR calc non Af Amer: 20 mL/min — ABNORMAL LOW (ref >60)
Glucose, Bld: 117 mg/dL — ABNORMAL HIGH (ref 70–99)
Phosphorus: 4.6 mg/dL (ref 2.5–4.6)
Potassium: 3.7 mmol/L (ref 3.5–5.1)
Sodium: 128 mmol/L — ABNORMAL LOW (ref 135–145)

## 2020-04-11 MED ORDER — HEPARIN SODIUM (PORCINE) 1000 UNIT/ML IJ SOLN
INTRAMUSCULAR | Status: AC
  Administered 2020-04-11: 1000 [IU]
  Filled 2020-04-11: qty 4

## 2020-04-11 MED ORDER — DEXTROSE 5 % IV SOLN
0.9400 g | INTRAVENOUS | Status: DC
  Administered 2020-04-12 – 2020-04-16 (×5): 0.94 g via INTRAVENOUS
  Filled 2020-04-11 (×6): qty 4.51

## 2020-04-11 MED ORDER — HEPARIN SODIUM (PORCINE) 1000 UNIT/ML DIALYSIS: 2000.0000 [IU] | INTRAMUSCULAR | Status: DC | PRN

## 2020-04-11 NOTE — Progress Notes (Signed)
Pt arrived to dialysis with tele wires, but no tele box. Pt placed on tele in dialysis

## 2020-04-11 NOTE — Progress Notes (Addendum)
[facility] PROGRESS NOTE  Shane Banks JKD:[id number] DOB: [birth year] DOA: 02/14/2020 PCP: [name], MD  Status: Inpatient--Remains inpatient appropriate because :Unsafe d/c plan, IV treatments appropriate due to intensity of illness or inability to take PO and Inpatient level of care appropriate due to severity of illness-patient continues to have tenuous respiratory status   Dispo:  Patient From:  Home  Planned Disposition:    Expected discharge date:    Medically stable for discharge: No-needs to transition to cuffless trach (respiratory status too tenuous to accomplish), needs to be able to sit upright in recliner to pursue dialysis in the outpatient setting (profoundly physically deconditioned and unable to even lift arms at this juncture), -remains on IV antibiotics secondary to sacral osteomyelitis and decubitus and recent bacteremia with last dose due on 7/8  **7/15: 1520 -spoke with patient's sister Shane Banks.  Patient's adult children are unable to make it to the hospital until Wednesday of next week.  They will visit patient and at that time family will reconvene to make decisions regarding next steps and progressing towards comfort measures as recommended.  Code Status: Full Family Communication: Shane Banks sister 03/29/20, brother Shane Banks on 04/02/20-EOL as above 7/14 DVT prophylaxis: Eliquis  HPI: 74 y.o. 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 Patient treated for Covid pneumonia January 2021 and had been on the vent because of progressive ARDS.  Because of lack of progression he underwent tracheostomy and was [facility].  Was sent back  to this facility in May due to trach site bleeding suspected hemoptysis.  He was also noted with sacral decubitus at time of presentation in May.  Since admission he has been treated for recurrent multidrug-resistant Pseudomonas pneumonia and MRSA and enterococcal bacteremia.  On 03/15/2020 patient returned to the ICU for ventilatory and pressor support he developed septic shock and developed acute renal failure requiring dialysis.  He developed sepsis physiology likely secondary to sacral decubitus ulcer with underlying osteomyelitis.    He has been weaned from pressors. PCCM is following for trach weaning with the assistance of SLP.  Patient was liberated from the vent on 03/21/2020 and was transferred to the floor in 03/22/2020.  Due to the lack of insurance approval he cannot go back to [facility].  Social worker is working on Clinical research associate.  He will need to be transition to a cuffless trach which unfortunately has not been accomplished due to recurrent [name] with respiratory failure physiology.  He has been deemed to not be a candidate for outpatient hemodialysis by the nephrologist.  Patient has not made any progress in mobility OT and PT have signed off.  On the morning of 7/6 patient developed hypoxemia and increased work of breathing with shallow respiratory effort, requiring frequent suctioning.  O2 sats decreased into the 70s but improved to 95% with bagging and suctioning.  Briefly required increase of FiO2 to 100% but was eventually decreased back to 40%.  Tracheal aspirate appeared consistent with tube feedings therefore tube feedings were stopped.  He was found to have multidrug-resistant Pseudomonas once again only sensitive to gent this time.  Concerns that this may be related to colonization with recommendation from ID to follow.  Patient later developed fevers within 24 hours after that recommendation  on the evening of 7/9 so at the recommendation of infectious disease service  given multidrug-resistant Pseudomonas recommendation was started on Fortaz plus [name].  Patient reevaluated by SLP on 7/14.  PMV was placed in his exhalatory effort was extremely diminished to the point he had difficulty moving air to the upper airway.  Able to demonstrate increased vocal intensity without definitive phonation.  The expiratory muscle strength trainer was used at the lowest setting of 5 cm of water with little effort but accurate.  Is able to complete 8 repetitions but reported dizziness.  Subjective: Alert-evaluated in dialysis And asked if he felt okay he nodded in the affirmative  Objective: Vitals:   04/11/20 1100 04/11/20 1145  BP: 116/71   Pulse: 91 92  Resp: (!) 25 16  Temp:    SpO2:  100%    Intake/Output Summary (Last 24 hours) at 04/11/2020 1225 Last data filed at 04/11/2020 1056 Gross per 24 hour  Intake --  Output 2300 ml  Net -2300 ml   Filed Weights   04/11/20 0500 04/11/20 0721 04/11/20 1056  Weight: 69 kg 68 kg 66.5 kg    Exam:  Constitutional: NAD, calm and alert today, nodding appropriately to questions asked, appears to be comfortable at this time Neck: midline cuffed trach in place  Respiratory: Bilateral lung sounds coarse on auscultation anteriorly.  Stable, chronic tachypneic but without significant increased work of breathing, trach collar oxygen/FiO2 28% Cardiovascular: Irregular rate with underlying rhythm atrial flutter rate controlled in the 80s to 90s, no murmurs / rubs / gallops.  Trace upper extremity edema improving.. 2+ pedal pulses. Dialysis catheter right subclavian vein. Abdomen: no tenderness, no masses palpated. No hepatosplenomegaly. Bowel sounds positive.  Colostomy in place with light brown watery stool.  Gastrostomy tube with tube feeding GU: Condom catheter in place draining yellow-colored urine to bedside bag Musculoskeletal: no clubbing / cyanosis. No joint deformity upper and lower extremities.  Not spontaneously  moving UEs but with PROM has adequate range of motion. Extension contracture involving the right wrist and right elbow with about 5% ability to flex with AROM. Diminished muscle tone throughout all extremities and too weak to follow commands with upper extremities/unable to lift extremities upon command  Skin: no rashes, lesions, sacral decubitus not examined Neurologic: CN 2-12 grossly intact. 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  PCCM following periodically to assess readiness for transition cuffless trach. 7/6: Significant respiratory decompensation with FiO2 increased up to 60%. Aspiration and recurrent Pseudomonas pneumonia as etiology. Of note, family refusing further conversations with palliative medicine team.  Contacted ethics team 7/9 and tentative meeting with family planned for Wednesday 7/14 at 4 PM-modality will be teleconference Has completed 8 doses of IV meropenem as of 7/9.   Repeat sputum culture obtained prior to initiation of meropenem has finalized with few MDR Pseudomonas (resistant to imipenem/Carbapenem's but sensitive to gentamicin).   Patient developed low-grade fever on 7/9-discussed with ID who recommended Fortaz with [name] 7/12: WBC down to 12.3 but has of 7/14 WBC back up to 13.7-afebrile and stable on lower level of oxygen at 28% Unfortunately given recurrent pneumonia and increased secretions with  associated generalized weakness/weak cough it is highly doubtful he can transition to cuffless trach at this  point.  Overall prognosis is poor.  Continue chest physiotherapy with vibra vest administered per RT  Goals of care: Ethics team meeting on 7/14 as described above. I will be recontacting patient's Sister Shane Banks today prior to 4 PM to determine if family has reached a decision regarding transitioning patient to comfort care. Please see detailed notes from Dr. Andria Frames dated 7/14  Diarrhea Likely noninfectious in etiology-C. difficile negative Suspect related to antibiotics and other medications bolus tube feeding Continues to have watery stools per colostomy 7/14 cholestyramine twice daily initiated Continue maintenance fluids at 30 cc/h  Mild funguria/oral yeast Urine culture with 30,000 colonies of yeast Continue Diflucan-had started last week but fell off medication list and was resumed on 7/8  Sacral osteomyelitis and HD catheter sepsis/  Enterococcal bacteremia: Tunneled catheter removal on 02/21/2020. Has tunneled HD catheter right subclavian-side unremarkable Completed 6 weeks of IV vancomycin and Flagyl 7/8  Paroxysmal atrial fibrillation and flutter/Acquired thrombophilia: Currently rate controlled on metoprolol Anticoagulation previously discontinued due to suspected GI bleed and was resumed 5/31 and patient has tolerated it any signs of bleeding.  Chronic kidney disease stage V now requiring hemodialysis 2/2 ATN and AKI from IV gentamicin: On HD since March 2021 status post tunneled catheter placement 02/28/2020 and as of 7/1 site unremarkable. Dialysis days: MWF SNF reported patient will need to be able to sit upright in recliner to receive hemodialysis before they will accept him as a patient-patient has been unable to meet this goal and is not making any progress in regards to physical abilities-OT/PT has signed off Unfortunately, this patient continues to decline, he has significant weakness and is unable to tolerate sitting up in a recliner receive outpatient  hemodialysis.  Attending nephrologist has documented that patient is not a candidate for outpatient hemodialysis **On TTS schedule this week then back to MWF next week  Chronic normocytic anemia: Due to anemia of chronic renal disease Status post 1 unit of PRBCs Hgb on 7/9 was 8.8 Continue erythropoietin and IV iron per renal.  History of nonhemorrhagic CVA: CT of the head showed atrophy and chronic microvascular disease. 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 Documented as full-thickness tissue loss  Severe protein caloric malnutrition: Tube feedings on hold due to concerns of a possible aspiration as above.  Patient is dialysis patient but will allow D5 normal saline at 30 cc/h to replace tube feeding volume loss Will allow medications per tube with small amounts of flush Nutrition Status: Nutrition Problem: Severe Malnutrition Etiology: chronic illness (chronic respiratory failure related to COVID ARDS) Signs/Symptoms: severe muscle depletion, severe fat depletion Interventions: Prostat, Tube feeding, Juven-continue tube feeding and check residuals every 4 hours      Data Reviewed: Basic Metabolic Panel: Recent Labs  Lab 04/05/20 0506 04/07/20 0654 04/08/20 1031 04/10/20 1010 04/11/20 0730  NA 135 134* 134* 131* 128*  K 3.9 3.9 4.0 3.9 3.7  CL 101 100 99 96* 94*  CO2 27 23 27 26 25   GLUCOSE 150* 117* 91 131* 117*  BUN 58* 79* 92* 91* 116*  CREATININE 2.64* 2.57* 3.20* 2.73* 2.99*  CALCIUM 9.0 9.4 9.2 9.3 9.3  MG  --  2.6*  --   --   --   PHOS 4.9*  --  4.4 4.4 4.6   Liver Function Tests: Recent Labs  Lab 04/05/20 0506  04/07/20 0654 04/08/20 1031 04/10/20 1010 04/11/20 0730  AST  --  33  --   --   --   ALT  --  20  --   --   --   ALKPHOS  --  110  --   --   --   BILITOT  --  0.5  --   --   --   PROT  --  7.4  --   --   --   ALBUMIN 2.1* 2.1* 1.9* 1.9* 1.8*   No  results for input(s): LIPASE, AMYLASE in the last 168 hours. No results for input(s): AMMONIA in the last 168 hours. CBC: Recent Labs  Lab 04/05/20 0506 04/07/20 0654 04/08/20 1031 04/10/20 1010 04/11/20 0730  WBC 12.9* 16.8* 12.3* 13.7* 13.7*  NEUTROABS  --  11.3*  --   --   --   HGB 8.8* 9.4* 8.5* 9.1* 8.4*  HCT 28.6* 31.3* 28.1* 30.0* 28.0*  MCV 82.9 81.7 80.3 79.6* 80.5  PLT 264 275 261 265 280   Cardiac Enzymes: No results for input(s): CKTOTAL, CKMB, CKMBINDEX, TROPONINI in the last 168 hours. BNP (last 3 results) No results for input(s): BNP in the last 8760 hours.  ProBNP (last 3 results) No results for input(s): PROBNP in the last 8760 hours.  CBG: Recent Labs  Lab 04/10/20 1618 04/10/20 1942 04/11/20 0107 04/11/20 0438 04/11/20 0826  GLUCAP 125* 159* 91 115* 120*    No results found for this or any previous visit (from the past 240 hour(s)).   Studies: No results found.  Scheduled Meds: . apixaban  2.5 mg Per Tube BID  . chlorhexidine  15 mL Mouth Rinse BID  . chlorhexidine gluconate (MEDLINE KIT)  15 mL Mouth Rinse BID  . Chlorhexidine Gluconate Cloth  6 each Topical Q0600  . cholestyramine  4 g Per Tube BID  . darbepoetin (ARANESP) injection - DIALYSIS  100 mcg Intravenous Q Mon-HD  . feeding supplement (PROSource TF)  45 mL Per Tube TID  . fluconazole  100 mg Per Tube Daily  . insulin aspart  0-6 Units Subcutaneous Q4H  . insulin aspart  2 Units Subcutaneous Q4H  . levothyroxine  25 mcg Per Tube Q0600  . lipase/protease/amylase)  20,880 Units Per Tube Once   And  . sodium bicarbonate  650 mg Per Tube Once  . mouth rinse  15 mL Mouth Rinse q12n4p  . metoprolol tartrate  25 mg Per Tube BID  . nutrition supplement (JUVEN)  1 packet Per Tube BID BM  . pantoprazole sodium  40 mg Per Tube Daily  . phenylephrine 200 mcg / ml (NON-ED USE ONLY) injection  100 mcg Intracavernosal Once   Continuous Infusions: . ceftazidime avibactam (AVYCAZ) IVPB 0.94  g (04/10/20 2215)  . dextrose 30 mL/hr at 04/09/20 1724  . feeding supplement (NEPRO CARB STEADY) 1,000 mL (04/10/20 0656)    Principal Problem:   Enterococcal bacteremia Active Problems:   Acute respiratory failure (HCC)   Hemoptysis   Pressure injury of skin   Protein-calorie malnutrition, severe   Decubitus ulcer of sacral region, stage 4 (HCC)   Fever   Palliative care by specialist   Goals of care, counseling/discussion   DNR (do not resuscitate)     Code Status: Full Family Communication: Shane Banks sister 03/29/20, brother Shane Banks on 04/02/20 DVT prophylaxis: Eliquis    Consultants: PCCM Ethics committee/Dr. Andria Frames  Procedures: Echocardiogram 1. Left ventricular ejection fraction, by estimation, is 60 to  65%. The  left ventricle has normal function. The left ventricle has no regional  wall motion abnormalities. There is severe left ventricular hypertrophy.  Left ventricular diastolic parameters  are consistent with Grade I diastolic dysfunction (impaired relaxation).  2. Right ventricular systolic function is normal. The right ventricular  size is normal. There is mildly elevated pulmonary artery systolic  pressure.  3. Left atrial size was mildly dilated.  4. Right atrial size was mildly dilated.  5. The mitral valve is grossly normal. Trivial mitral valve  regurgitation.  6. The aortic valve is tricuspid. Aortic valve regurgitation is trivial.   Antibiotics: Anti-infectives (From admission, onward)   Start     Dose/Rate Route Frequency Ordered Stop   04/10/20 2100  ceftazidime-avibactam (AVYCAZ) 0.94 g in dextrose 5 % 50 mL IVPB     Discontinue     0.94 g 25 mL/hr over 2 Hours Intravenous Every 12 hours 04/10/20 2005     04/08/20 1420  ceftazidime-avibactam (AVYCAZ) 0.94 g in dextrose 5 % 50 mL IVPB  Status:  Discontinued        0.94 g 25 mL/hr over 2 Hours Intravenous Daily at bedtime 04/08/20 1420 04/10/20 2005   04/07/20 1800   ceftazidime-avibactam (AVYCAZ) 0.94 g in dextrose 5 % 50 mL IVPB  Status:  Discontinued        0.94 g 25 mL/hr over 2 Hours Intravenous Every 12 hours 04/07/20 1322 04/08/20 1420   04/05/20 1000  fluconazole (DIFLUCAN) 40 MG/ML suspension 100 mg     Discontinue    "Followed by" Linked Group Details   100 mg Per Tube Daily 04/04/20 2203     04/04/20 2300  fluconazole (DIFLUCAN) 40 MG/ML suspension 200 mg       "Followed by" Linked Group Details   200 mg Per Tube  Once 04/04/20 2203 04/04/20 2325   04/01/20 1013  vancomycin (VANCOCIN) 500-5 MG/100ML-% IVPB       Note to Pharmacy: Cherylann Banas   : cabinet override      04/01/20 1013 04/01/20 1253   03/29/20 1552  vancomycin (VANCOCIN) 500-5 MG/100ML-% IVPB       Note to Pharmacy: Anthonette Legato, Melisa   : cabinet override      03/29/20 1552 03/29/20 1556   03/29/20 1415  fluconazole (DIFLUCAN) 40 MG/ML suspension 100 mg        100 mg Oral Daily 03/29/20 1408 03/31/20 1015   03/28/20 2000  meropenem (MERREM) 500 mg in sodium chloride 0.9 % 100 mL IVPB        500 mg 200 mL/hr over 30 Minutes Intravenous Every 24 hours 03/28/20 1623 04/04/20 2201   03/25/20 1800  cefTAZidime (FORTAZ) 1 g in sodium chloride 0.9 % 100 mL IVPB  Status:  Discontinued       "Followed by" Linked Group Details   1 g 200 mL/hr over 30 Minutes Intravenous Every 24 hours 03/18/20 1015 03/28/20 1613   03/22/20 0830  vancomycin (VANCOREADY) IVPB 500 mg/100 mL        500 mg 100 mL/hr over 60 Minutes Intravenous  Once 03/22/20 0827 03/22/20 1014   03/20/20 1200  vancomycin (VANCOCIN) IVPB 500 mg/100 ml premix        500 mg 100 mL/hr over 60 Minutes Intravenous Every M-W-F (Hemodialysis) 03/19/20 0941 04/03/20 1500   03/18/20 1800  meropenem (MERREM) 500 mg in sodium chloride 0.9 % 100 mL IVPB       "Followed by" Linked Group Details  500 mg 200 mL/hr over 30 Minutes Intravenous Every 24 hours 03/18/20 1015 03/25/20 0026   03/18/20 1200  vancomycin (VANCOCIN) IVPB 500  mg/100 ml premix        500 mg 100 mL/hr over 60 Minutes Intravenous Every M-W-F (Hemodialysis) 03/18/20 0938 03/18/20 1700   03/18/20 1200  cefTAZidime (FORTAZ) 2 g in sodium chloride 0.9 % 100 mL IVPB  Status:  Discontinued        2 g 200 mL/hr over 30 Minutes Intravenous Every Mon (Hemodialysis) 03/18/20 0939 03/18/20 1015   03/07/20 1800  cefTAZidime (FORTAZ) 2 g in sodium chloride 0.9 % 100 mL IVPB  Status:  Discontinued        2 g 200 mL/hr over 30 Minutes Intravenous Every T-Th-Sa (1800) 03/06/20 1255 03/18/20 1015   03/07/20 1200  vancomycin (VANCOCIN) IVPB 500 mg/100 ml premix  Status:  Discontinued        500 mg 100 mL/hr over 60 Minutes Intravenous Every T-Th-Sa (Hemodialysis) 03/06/20 1255 03/19/20 0941   03/04/20 2000  cefTAZidime (FORTAZ) 2 g in sodium chloride 0.9 % 100 mL IVPB  Status:  Discontinued        2 g 200 mL/hr over 30 Minutes Intravenous Every M-W-F (2000) 03/01/20 1136 03/06/20 1255   03/02/20 1200  vancomycin (VANCOCIN) IVPB 500 mg/100 ml premix        500 mg 100 mL/hr over 60 Minutes Intravenous Every T-Th-Sa (Hemodialysis) 03/01/20 1131 03/02/20 1710   03/01/20 1230  vancomycin (VANCOCIN) IVPB 500 mg/100 ml premix  Status:  Discontinued        500 mg 100 mL/hr over 60 Minutes Intravenous Every M-W-F (Hemodialysis) 03/01/20 1131 03/06/20 1255   02/28/20 1110  vancomycin (VANCOREADY) IVPB 500 mg/100 mL        over 60 Minutes  Continuous PRN 02/28/20 1112 02/28/20 1110   02/28/20 1101  vancomycin (VANCOCIN) 1-5 GM/200ML-% IVPB  Status:  Discontinued       Note to Pharmacy: Lytle Butte   : cabinet override      02/28/20 1101 02/28/20 1327   02/27/20 1800  cefTAZidime (FORTAZ) 1 g in sodium chloride 0.9 % 100 mL IVPB        1 g 200 mL/hr over 30 Minutes Intravenous Every 24 hours 02/27/20 0947 03/03/20 1738   02/27/20 1200  vancomycin (VANCOCIN) IVPB 500 mg/100 ml premix        500 mg 100 mL/hr over 60 Minutes Intravenous Once 02/27/20 0829 02/27/20 1343    02/24/20 1800  vancomycin (VANCOREADY) IVPB 500 mg/100 mL        500 mg 100 mL/hr over 60 Minutes Intravenous  Once 02/24/20 1308 02/24/20 1905   02/22/20 2200  metroNIDAZOLE (FLAGYL) tablet 500 mg  Status:  Discontinued        500 mg Per Tube Every 8 hours 02/22/20 0957 03/30/20 1057   02/22/20 1700  cefTRIAXone (ROCEPHIN) 2 g in sodium chloride 0.9 % 100 mL IVPB  Status:  Discontinued        2 g 200 mL/hr over 30 Minutes Intravenous Every 24 hours 02/22/20 0957 02/27/20 0947   02/22/20 1200  vancomycin (VANCOCIN) IVPB 500 mg/100 ml premix  Status:  Discontinued        500 mg 100 mL/hr over 60 Minutes Intravenous Every T-Th-Sa (Hemodialysis) 02/20/20 1253 02/21/20 1040   02/21/20 1431  vancomycin variable dose per unstable renal function (pharmacist dosing)  Status:  Discontinued         Does  not apply See admin instructions 02/21/20 1431 03/01/20 1131   02/21/20 1200  vancomycin (VANCOREADY) IVPB 500 mg/100 mL        500 mg 100 mL/hr over 60 Minutes Intravenous  Once 02/21/20 0956 02/21/20 1253   02/21/20 0714  vancomycin (VANCOCIN) 500-5 MG/100ML-% IVPB  Status:  Discontinued       Note to Pharmacy: Ashley Akin   : cabinet override      02/21/20 0714 02/21/20 1346   02/20/20 1400  vancomycin (VANCOREADY) IVPB 1250 mg/250 mL        1,250 mg 166.7 mL/hr over 90 Minutes Intravenous  Once 02/20/20 1253 02/20/20 1538   02/20/20 1200  levofloxacin (LEVAQUIN) IVPB 500 mg  Status:  Discontinued        500 mg 100 mL/hr over 60 Minutes Intravenous Every 48 hours 02/20/20 1020 02/21/20 0943   02/15/20 1545  vancomycin (VANCOCIN) IVPB 1000 mg/200 mL premix  Status:  Discontinued       "Followed by" Linked Group Details   1,000 mg 200 mL/hr over 60 Minutes Intravenous Every 24 hours 02/14/20 1530 02/14/20 1745   02/15/20 0330  ceFEPIme (MAXIPIME) 2 g in sodium chloride 0.9 % 100 mL IVPB  Status:  Discontinued        2 g 200 mL/hr over 30 Minutes Intravenous Every 12 hours 02/14/20 1531  02/14/20 1531   02/14/20 1531  ceFEPIme (MAXIPIME) 2 g in sodium chloride 0.9 % 100 mL IVPB  Status:  Discontinued        2 g 200 mL/hr over 30 Minutes Intravenous Every 12 hours 02/14/20 1531 02/14/20 1745   02/14/20 1530  ceFEPIme (MAXIPIME) 2 g in sodium chloride 0.9 % 100 mL IVPB  Status:  Discontinued        2 g 200 mL/hr over 30 Minutes Intravenous  Once 02/14/20 1520 02/14/20 1531   02/14/20 1530  vancomycin (VANCOREADY) IVPB 1500 mg/300 mL  Status:  Discontinued       "Followed by" Linked Group Details   1,500 mg 150 mL/hr over 120 Minutes Intravenous  Once 02/14/20 1530 02/14/20 1745        Time spent: 25 minutes    Erin Hearing ANP [facility] Pager [phone]. If 7PM-7AM, please contact night-coverage at [email or web address] 04/11/2020, 12:25 PM  LOS: 57 days

## 2020-04-11 NOTE — Procedures (Signed)
Pt seen on HD today, rollover from yesterday.  Will keep on TTS schedule this week , then resume MWF next week.   I was present at this dialysis session, have reviewed the session itself and made  appropriate changes Kelly Splinter MD [place] pager [phone]   04/11/2020, 1:14 PM

## 2020-04-12 LAB — GLUCOSE, CAPILLARY
Glucose-Capillary: 101 mg/dL — ABNORMAL HIGH (ref 70–99)
Glucose-Capillary: 105 mg/dL — ABNORMAL HIGH (ref 70–99)
Glucose-Capillary: 112 mg/dL — ABNORMAL HIGH (ref 70–99)
Glucose-Capillary: 149 mg/dL — ABNORMAL HIGH (ref 70–99)
Glucose-Capillary: 162 mg/dL — ABNORMAL HIGH (ref 70–99)

## 2020-04-12 LAB — CBC
HCT: 30.4 % — ABNORMAL LOW (ref 39.0–52.0)
Hemoglobin: 9.4 g/dL — ABNORMAL LOW (ref 13.0–17.0)
MCH: 24.7 pg — ABNORMAL LOW (ref 26.0–34.0)
MCHC: 30.9 g/dL (ref 30.0–36.0)
MCV: 79.8 fL — ABNORMAL LOW (ref 80.0–100.0)
Platelets: 302 10*3/uL (ref 150–400)
RBC: 3.81 MIL/uL — ABNORMAL LOW (ref 4.22–5.81)
RDW: 18 % — ABNORMAL HIGH (ref 11.5–15.5)
WBC: 18.3 10*3/uL — ABNORMAL HIGH (ref 4.0–10.5)
nRBC: 0 % (ref 0.0–0.2)

## 2020-04-12 LAB — RENAL FUNCTION PANEL
Albumin: 2 g/dL — ABNORMAL LOW (ref 3.5–5.0)
Anion gap: 8 (ref 5–15)
BUN: 57 mg/dL — ABNORMAL HIGH (ref 8–23)
CO2: 24 mmol/L (ref 22–32)
Calcium: 9.2 mg/dL (ref 8.9–10.3)
Chloride: 98 mmol/L (ref 98–111)
Creatinine, Ser: 1.87 mg/dL — ABNORMAL HIGH (ref 0.61–1.24)
GFR calc Af Amer: 41 mL/min — ABNORMAL LOW (ref >60)
GFR calc non Af Amer: 35 mL/min — ABNORMAL LOW (ref >60)
Glucose, Bld: 106 mg/dL — ABNORMAL HIGH (ref 70–99)
Phosphorus: 2.7 mg/dL (ref 2.5–4.6)
Potassium: 3.4 mmol/L — ABNORMAL LOW (ref 3.5–5.1)
Sodium: 130 mmol/L — ABNORMAL LOW (ref 135–145)

## 2020-04-12 MED ORDER — HEPARIN SODIUM (PORCINE) 1000 UNIT/ML IJ SOLN
INTRAMUSCULAR | Status: AC
  Filled 2020-04-12: qty 4

## 2020-04-12 NOTE — Progress Notes (Signed)
Patient ID: Shane Banks, male   DOB: 22-Sep-1946, 74 y.o.   MRN: [id number]  [facility] Progress Note   Assessment/ Plan:   1.  Acute on chronic hypoxic respiratory failure: Status post tracheostomy with history of recurrent HCAP. 2. ESRD: Usually on a Monday/Wednesday/Friday schedule but this week on TTS schedule due to patient volume.  Given his current deconditioning and nutritional needs as well as ongoing tracheostomy care requirements; he is not in a position for outpatient hemodialysis placement. 3. Anemia: Likely secondary to underlying end-stage renal disease and compounded by acute illness/inflammatory component.  Continue ESA. 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  Continue to follow off of vitamin D receptor analogs. 5. Nutrition: Remains n.p.o. due to concerns of recurrent aspiration, nutrition per feeding tube. 6. Hypertension: Blood pressures within acceptable range, will continue efforts at volume unloading to match input and help alleviate edema.  Subjective:   No acute events overnight, underwent hemodialysis yesterday.   Objective:   BP 125/71   Pulse 83   Temp 98.7 F (37.1 C) (Oral)   Resp (!) 46   Ht 5' 9"  (1.753 m)   Wt 66.5 kg   SpO2 98%   BMI 21.65 kg/m   Physical Exam: Gen: Chronically ill-appearing man with tracheostomy. CVS: Pulse regular rhythm, normal rate, S1 and S2 normal Resp: Coarse breath sounds bilaterally, no distinct rales or rhonchi.  Right IJ TDC Abd: Soft, colostomy bag in situ.  Feeding tube in situ Ext: Trace upper extremity and lower extremity edema bilaterally with some dependent edema.  Labs: BMET Recent Labs  Lab 04/07/20 0654 04/08/20 1031 04/10/20 1010 04/11/20 0730 04/12/20 0543  NA 134* 134* 131* 128* 130*  K 3.9 4.0 3.9 3.7 3.4*  CL 100 99 96* 94* 98  CO2 23 27 26 25 24   GLUCOSE 117* 91 131* 117* 106*  BUN 79* 92* 91*  116* 57*  CREATININE 2.57* 3.20* 2.73* 2.99* 1.87*  CALCIUM 9.4 9.2 9.3 9.3 9.2  PHOS  --  4.4 4.4 4.6 2.7   CBC Recent Labs  Lab 04/07/20 0654 04/07/20 0654 04/08/20 1031 04/10/20 1010 04/11/20 0730 04/12/20 0543  WBC 16.8*   < > 12.3* 13.7* 13.7* 18.3*  NEUTROABS 11.3*  --   --   --   --   --   HGB 9.4*   < > 8.5* 9.1* 8.4* 9.4*  HCT 31.3*   < > 28.1* 30.0* 28.0* 30.4*  MCV 81.7   < > 80.3 79.6* 80.5 79.8*  PLT 275   < > 261 265 280 302   < > = values in this interval not displayed.      Medications:    . apixaban  2.5 mg Per Tube BID  . chlorhexidine  15 mL Mouth Rinse BID  . chlorhexidine gluconate (MEDLINE KIT)  15 mL Mouth Rinse BID  . Chlorhexidine Gluconate Cloth  6 each Topical Q0600  . cholestyramine  4 g Per Tube BID  . darbepoetin (ARANESP) injection - DIALYSIS  100 mcg Intravenous Q Mon-HD  . feeding supplement (PROSource TF)  45 mL Per Tube TID  . fluconazole  100 mg Per Tube Daily  . insulin aspart  0-6 Units Subcutaneous Q4H  . insulin aspart  2 Units Subcutaneous Q4H  . levothyroxine  25 mcg Per Tube Q0600  . lipase/protease/amylase)  20,880 Units Per Tube Once   And  . sodium bicarbonate  650 mg Per Tube Once  . mouth rinse  15 mL Mouth Rinse q12n4p  . metoprolol tartrate  25 mg Per Tube BID  . nutrition supplement (JUVEN)  1 packet Per Tube BID BM  . pantoprazole sodium  40 mg Per Tube Daily  . phenylephrine 200 mcg / ml (NON-ED USE ONLY) injection  100 mcg Intracavernosal Once     Elmarie Shiley, MD 04/12/2020, 7:59 AM

## 2020-04-12 NOTE — Progress Notes (Signed)
[facility] PROGRESS NOTE  Shane Banks OFB:[id number] DOB: Dec 06, 1945 DOA: 02/14/2020 PCP: [name], MD  Status: Inpatient--Remains inpatient appropriate because :Unsafe d/c plan, IV treatments appropriate due to intensity of illness or inability to take PO and Inpatient level of care appropriate due to severity of illness-patient continues to have tenuous respiratory status   Dispo:  Patient From:  Home  Planned Disposition:    Expected discharge date:    Medically stable for discharge: No-needs to transition to cuffless trach (respiratory status too tenuous to accomplish), needs to be able to sit upright in recliner to pursue dialysis in the outpatient setting (profoundly physically deconditioned and unable to even lift arms at this juncture), -remains on IV antibiotics secondary to sacral osteomyelitis and decubitus and recent bacteremia with last dose due on 7/8  **7/15: 1520 -spoke with patient's sister [name].  Patient's adult children are unable to make it to the hospital until Wednesday July 21st.  They will visit patient and at that time family will reconvene to make decisions regarding next steps and progressing towards comfort measures as recommended.  Code Status: Full Family Communication: [name] sister 7/14 and 04/11/20, brother [name] on 04/02/20 DVT prophylaxis: Eliquis  HPI: 74 y.o. 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 Patient treated for Covid pneumonia January 2021 and had been on the vent because of progressive ARDS.  Because of lack of progression he underwent tracheostomy and was [facility].  Was sent back to this  facility in May due to trach site bleeding suspected hemoptysis.  He was also noted with sacral decubitus at time of presentation in May.  Since admission he has been treated for recurrent multidrug-resistant Pseudomonas pneumonia and MRSA and enterococcal bacteremia.  On 03/15/2020 patient returned to the ICU for ventilatory and pressor support he developed septic shock and developed acute renal failure requiring dialysis.  He developed sepsis physiology likely secondary to sacral decubitus ulcer with underlying osteomyelitis.    He has been weaned from pressors. PCCM is following for trach weaning with the assistance of SLP.  Patient was liberated from the vent on 03/21/2020 and was transferred to the floor in 03/22/2020.  Due to the lack of insurance approval he cannot go back to [facility].  Social worker is working on Clinical research associate.  He will need to be transition to a cuffless trach which unfortunately has not been accomplished due to recurrent [name] with respiratory failure physiology.  He has been deemed to not be a candidate for outpatient hemodialysis by the nephrologist.  Patient has not made any progress in mobility OT and PT have signed off.  On the morning of 7/6 patient developed hypoxemia and increased work of breathing with shallow respiratory effort, requiring frequent suctioning.  O2 sats decreased into the 70s but improved to 95% with bagging and suctioning.  Briefly required increase of FiO2 to 100% but was eventually decreased back to 40%.  Tracheal aspirate appeared consistent with tube feedings therefore tube feedings were stopped.  He was found to have multidrug-resistant Pseudomonas once again only sensitive to gent this time.  Concerns that this may be related to colonization with recommendation from ID to follow.  Patient later developed fevers within 24 hours after that recommendation on the  evening of 7/9 so at the recommendation of infectious disease service given  multidrug-resistant Pseudomonas recommendation was started on Fortaz plus [name].  Patient reevaluated by SLP on 7/14.  PMV was placed in his exhalatory effort was extremely diminished to the point he had difficulty moving air to the upper airway.  Able to demonstrate increased vocal intensity without definitive phonation.  The expiratory muscle strength trainer was used at the lowest setting of 5 cm of water with little effort but accurate.  Is able to complete 8 repetitions but reported dizziness.  Subjective: Alert with inconsistent nodding to questions asked although will maintain eye contact when spoken to  Objective: Vitals:   04/12/20 0832 04/12/20 1110  BP: (!) 148/71   Pulse: 80 74  Resp: (!) 0 (!) 24  Temp: 97.9 F (36.6 C)   SpO2: 95% 98%    Intake/Output Summary (Last 24 hours) at 04/12/2020 1113 Last data filed at 04/11/2020 1800 Gross per 24 hour  Intake 0 ml  Output 200 ml  Net -200 ml   Filed Weights   04/11/20 0500 04/11/20 0721 04/11/20 1056  Weight: 69 kg 68 kg 66.5 kg    Exam:  Constitutional: NAD, calm and alert today, nodding appropriately to questions asked, appears to be comfortable at this time Neck: midline cuffed trach in place  Respiratory: Bilateral lung sounds coarse on auscultation anteriorly.  Stable, chronic tachypneic but without significant increased work of breathing, trach collar oxygen/FiO2 28% Cardiovascular: Irregular rate with underlying rhythm atrial flutter rate controlled in the 80s to 90s, no murmurs / rubs / gallops.  Trace upper extremity edema improving.. 2+ pedal pulses. Dialysis catheter right subclavian vein. Abdomen: no tenderness, no masses palpated. No hepatosplenomegaly. Bowel sounds positive.  Colostomy in place with light brown watery stool.  Gastrostomy tube with tube feeding GU: Condom catheter in place draining yellow-colored urine to bedside bag Musculoskeletal: no clubbing / cyanosis. No joint deformity upper and lower  extremities.  Not spontaneously moving UEs but with PROM has adequate range of motion. Extension contracture involving the right wrist and right elbow with about 5% ability to flex with AROM. Diminished muscle tone throughout all extremities and too weak to follow commands with upper extremities/unable to lift extremities upon command  Skin: no rashes, lesions, sacral decubitus not examined Neurologic: CN 2-12 grossly intact. 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  PCCM following periodically to assess readiness for transition cuffless trach. 7/6: Significant respiratory decompensation with FiO2 increased up to 60%. Aspiration and recurrent Pseudomonas pneumonia as etiology. Of note, family refusing further conversations with palliative medicine team.  Excellent telephonic meeting with ethics physician Dr. Andria Frames on 7/14.  Please see his note for details.  Sister made aware of no meaningful overall recovery including from a pulmonary standpoint. Has completed 8 doses of IV meropenem as of 7/9.   Repeat sputum culture obtained prior to initiation of meropenem has finalized with few MDR Pseudomonas (resistant to imipenem/Carbapenem's but sensitive to gentamicin). Patient developed low-grade fever on 7/9-discussed with ID who recommended [name]  with [name] Unfortunately given recurrent pneumonia and increased secretions with associated generalized weakness/weak cough it is highly doubtful  he can transition to cuffless trach at this point.  Overall  prognosis is poor.  Continue chest physiotherapy with vibra vest administered per RT  Goals of care: Ethics team meeting on 7/14 as described above. I will be recontacting patient's Sister [name] today prior to 4 PM to determine if family has reached a decision regarding transitioning patient to comfort care. Please see detailed notes from Dr. Andria Frames dated 7/14 Patient's children will be able to visit with him on Wednesday 7/21.  It is understood that after this visit family will reconvene and make a decision regarding transitioning to comfort care  Diarrhea Likely noninfectious in etiology-C. difficile negative Suspect related to antibiotics and other medications bolus tube feeding Continues to have watery stools per colostomy 7/14 cholestyramine twice daily initiated Continue maintenance fluids at 30 cc/h  Mild funguria/oral yeast Urine culture with 30,000 colonies of yeast Continue Diflucan-had started last week but fell off medication list and was resumed on 7/8  Sacral osteomyelitis and HD catheter sepsis/  Enterococcal bacteremia: Tunneled catheter removal on 02/21/2020. Has tunneled HD catheter right subclavian-side unremarkable Completed 6 weeks of IV vancomycin and Flagyl 7/8  Paroxysmal atrial fibrillation and flutter/Acquired thrombophilia: Currently rate controlled on metoprolol Anticoagulation previously discontinued due to suspected GI bleed and was resumed 5/31 and patient has tolerated it any signs of bleeding.  Chronic kidney disease stage V now requiring hemodialysis 2/2 ATN and AKI from IV gentamicin: On HD since March 2021 status post tunneled catheter placement 02/28/2020 and as of 7/1 site unremarkable. Dialysis days: MWF SNF reported patient will need to be able to sit upright in recliner to receive hemodialysis before they will accept him as a patient-patient has been unable to meet this goal and is not making any progress in regards to physical abilities-OT/PT  has signed off Unfortunately, this patient continues to decline, he has significant weakness and is unable to tolerate sitting up in a recliner receive outpatient hemodialysis.  Attending nephrologist has documented that patient is not a candidate for outpatient hemodialysis **On TTS schedule this week then back to MWF next week  Chronic normocytic anemia: Due to anemia of chronic renal disease Status post 1 unit of PRBCs Hgb on 7/9 was 8.8 Continue erythropoietin and IV iron per renal.  History of nonhemorrhagic CVA: CT of the head showed atrophy and chronic microvascular disease. 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 Documented as full-thickness tissue loss  Severe protein caloric malnutrition: Tube feedings on hold due to concerns of a possible aspiration as above.  Patient is dialysis patient but will allow D5 normal saline at 30 cc/h to replace tube feeding volume loss Will allow medications per tube with small amounts of flush Nutrition Status: Nutrition Problem: Severe Malnutrition Etiology: chronic illness (chronic respiratory failure related to COVID ARDS) Signs/Symptoms: severe muscle depletion, severe fat depletion Interventions: Prostat, Tube feeding, Juven-continue tube feeding and check residuals every 4 hours      Data Reviewed: Basic Metabolic Panel: Recent Labs  Lab 04/07/20 0654 04/08/20 1031 04/10/20 1010 04/11/20 0730 04/12/20 0543  NA 134* 134* 131* 128* 130*  K 3.9 4.0 3.9 3.7 3.4*  CL 100 99 96* 94* 98  CO2 _0 GLUCOSE 117* 91 131* 117* 106*  BUN 79* 92* 91* 116* 57*  CREATININE 2.57* 3.20* 2.73* 2.99* 1.87*  CALCIUM 9.4 9.2 9.3 9.3 9.2  MG 2.6*  --   --   --   --  PHOS  --  4.4 4.4 4.6 2.7   Liver Function Tests: Recent Labs  Lab 04/07/20 0654 04/08/20 1031 04/10/20 1010 04/11/20 0730 04/12/20 0543  AST 33  --   --   --   --   ALT 20   --   --   --   --   ALKPHOS 110  --   --   --   --   BILITOT 0.5  --   --   --   --   PROT 7.4  --   --   --   --   ALBUMIN 2.1* 1.9* 1.9* 1.8* 2.0*   No results for input(s): LIPASE, AMYLASE in the last 168 hours. No results for input(s): AMMONIA in the last 168 hours. CBC: Recent Labs  Lab 04/07/20 0654 04/08/20 1031 04/10/20 1010 04/11/20 0730 04/12/20 0543  WBC 16.8* 12.3* 13.7* 13.7* 18.3*  NEUTROABS 11.3*  --   --   --   --   HGB 9.4* 8.5* 9.1* 8.4* 9.4*  HCT 31.3* 28.1* 30.0* 28.0* 30.4*  MCV 81.7 80.3 79.6* 80.5 79.8*  PLT 275 261 265 280 302   Cardiac Enzymes: No results for input(s): CKTOTAL, CKMB, CKMBINDEX, TROPONINI in the last 168 hours. BNP (last 3 results) No results for input(s): BNP in the last 8760 hours.  ProBNP (last 3 results) No results for input(s): PROBNP in the last 8760 hours.  CBG: Recent Labs  Lab 04/11/20 1701 04/11/20 1945 04/11/20 2324 04/12/20 0401 04/12/20 0839  GLUCAP 139* 113* 127* 105* 101*    No results found for this or any previous visit (from the past 240 hour(s)).   Studies: No results found.  Scheduled Meds: . apixaban  2.5 mg Per Tube BID  . chlorhexidine  15 mL Mouth Rinse BID  . chlorhexidine gluconate (MEDLINE KIT)  15 mL Mouth Rinse BID  . Chlorhexidine Gluconate Cloth  6 each Topical Q0600  . cholestyramine  4 g Per Tube BID  . darbepoetin (ARANESP) injection - DIALYSIS  100 mcg Intravenous Q Mon-HD  . feeding supplement (PROSource TF)  45 mL Per Tube TID  . fluconazole  100 mg Per Tube Daily  . insulin aspart  0-6 Units Subcutaneous Q4H  . insulin aspart  2 Units Subcutaneous Q4H  . levothyroxine  25 mcg Per Tube Q0600  . lipase/protease/amylase)  20,880 Units Per Tube Once   And  . sodium bicarbonate  650 mg Per Tube Once  . mouth rinse  15 mL Mouth Rinse q12n4p  . metoprolol tartrate  25 mg Per Tube BID  . nutrition supplement (JUVEN)  1 packet Per Tube BID BM  . pantoprazole sodium  40 mg Per Tube  Daily  . phenylephrine 200 mcg / ml (NON-ED USE ONLY) injection  100 mcg Intracavernosal Once   Continuous Infusions: . ceftazidime avibactam (AVYCAZ) IVPB 0.94 g (04/12/20 0932)  . dextrose 30 mL/hr at 04/12/20 0930  . feeding supplement (NEPRO CARB STEADY) 1,000 mL (04/10/20 0656)    Principal Problem:   Enterococcal bacteremia Active Problems:   Acute respiratory failure (HCC)   Hemoptysis   Pressure injury of skin   Protein-calorie malnutrition, severe   Decubitus ulcer of sacral region, stage 4 (HCC)   Fever   Palliative care by specialist   Goals of care, counseling/discussion   DNR (do not resuscitate)     Code Status: Full Family Communication: [name] sister 03/29/20, brother [name] on 04/02/20 DVT prophylaxis: Eliquis  Consultants: PCCM Ethics committee/Dr. Andria Frames  Procedures: Echocardiogram 1. Left ventricular ejection fraction, by estimation, is 60 to 65%. The  left ventricle has normal function. The left ventricle has no regional  wall motion abnormalities. There is severe left ventricular hypertrophy.  Left ventricular diastolic parameters  are consistent with Grade I diastolic dysfunction (impaired relaxation).  2. Right ventricular systolic function is normal. The right ventricular  size is normal. There is mildly elevated pulmonary artery systolic  pressure.  3. Left atrial size was mildly dilated.  4. Right atrial size was mildly dilated.  5. The mitral valve is grossly normal. Trivial mitral valve  regurgitation.  6. The aortic valve is tricuspid. Aortic valve regurgitation is trivial.   Antibiotics: Anti-infectives (From admission, onward)   Start     Dose/Rate Route Frequency Ordered Stop   04/12/20 0900  ceftazidime-avibactam (AVYCAZ) 0.94 g in dextrose 5 % 50 mL IVPB     Discontinue     0.94 g 25 mL/hr over 2 Hours Intravenous Every 24 hours 04/11/20 1225     04/10/20 2100  ceftazidime-avibactam (AVYCAZ) 0.94 g in  dextrose 5 % 50 mL IVPB  Status:  Discontinued        0.94 g 25 mL/hr over 2 Hours Intravenous Every 12 hours 04/10/20 2005 04/11/20 1225   04/08/20 1420  ceftazidime-avibactam (AVYCAZ) 0.94 g in dextrose 5 % 50 mL IVPB  Status:  Discontinued        0.94 g 25 mL/hr over 2 Hours Intravenous Daily at bedtime 04/08/20 1420 04/10/20 2005   04/07/20 1800  ceftazidime-avibactam (AVYCAZ) 0.94 g in dextrose 5 % 50 mL IVPB  Status:  Discontinued        0.94 g 25 mL/hr over 2 Hours Intravenous Every 12 hours 04/07/20 1322 04/08/20 1420   04/05/20 1000  fluconazole (DIFLUCAN) 40 MG/ML suspension 100 mg     Discontinue    "Followed by" Linked Group Details   100 mg Per Tube Daily 04/04/20 2203     04/04/20 2300  fluconazole (DIFLUCAN) 40 MG/ML suspension 200 mg       "Followed by" Linked Group Details   200 mg Per Tube  Once 04/04/20 2203 04/04/20 2325   04/01/20 1013  vancomycin (VANCOCIN) 500-5 MG/100ML-% IVPB       Note to Pharmacy: Cherylann Banas   : cabinet override      04/01/20 1013 04/01/20 1253   03/29/20 1552  vancomycin (VANCOCIN) 500-5 MG/100ML-% IVPB       Note to Pharmacy: Anthonette Legato, Melisa   : cabinet override      03/29/20 1552 03/29/20 1556   03/29/20 1415  fluconazole (DIFLUCAN) 40 MG/ML suspension 100 mg        100 mg Oral Daily 03/29/20 1408 03/31/20 1015   03/28/20 2000  meropenem (MERREM) 500 mg in sodium chloride 0.9 % 100 mL IVPB        500 mg 200 mL/hr over 30 Minutes Intravenous Every 24 hours 03/28/20 1623 04/04/20 2201   03/25/20 1800  cefTAZidime (FORTAZ) 1 g in sodium chloride 0.9 % 100 mL IVPB  Status:  Discontinued       "Followed by" Linked Group Details   1 g 200 mL/hr over 30 Minutes Intravenous Every 24 hours 03/18/20 1015 03/28/20 1613   03/22/20 0830  vancomycin (VANCOREADY) IVPB 500 mg/100 mL        500 mg 100 mL/hr over 60 Minutes Intravenous  Once 03/22/20 0827 03/22/20 1014   03/20/20 1200  vancomycin (VANCOCIN) IVPB 500 mg/100 ml premix        500 mg 100  mL/hr over 60 Minutes Intravenous Every M-W-F (Hemodialysis) 03/19/20 0941 04/03/20 1500   03/18/20 1800  meropenem (MERREM) 500 mg in sodium chloride 0.9 % 100 mL IVPB       "Followed by" Linked Group Details   500 mg 200 mL/hr over 30 Minutes Intravenous Every 24 hours 03/18/20 1015 03/25/20 0026   03/18/20 1200  vancomycin (VANCOCIN) IVPB 500 mg/100 ml premix        500 mg 100 mL/hr over 60 Minutes Intravenous Every M-W-F (Hemodialysis) 03/18/20 0938 03/18/20 1700   03/18/20 1200  cefTAZidime (FORTAZ) 2 g in sodium chloride 0.9 % 100 mL IVPB  Status:  Discontinued        2 g 200 mL/hr over 30 Minutes Intravenous Every Mon (Hemodialysis) 03/18/20 0939 03/18/20 1015   03/07/20 1800  cefTAZidime (FORTAZ) 2 g in sodium chloride 0.9 % 100 mL IVPB  Status:  Discontinued        2 g 200 mL/hr over 30 Minutes Intravenous Every T-Th-Sa (1800) 03/06/20 1255 03/18/20 1015   03/07/20 1200  vancomycin (VANCOCIN) IVPB 500 mg/100 ml premix  Status:  Discontinued        500 mg 100 mL/hr over 60 Minutes Intravenous Every T-Th-Sa (Hemodialysis) 03/06/20 1255 03/19/20 0941   03/04/20 2000  cefTAZidime (FORTAZ) 2 g in sodium chloride 0.9 % 100 mL IVPB  Status:  Discontinued        2 g 200 mL/hr over 30 Minutes Intravenous Every M-W-F (2000) 03/01/20 1136 03/06/20 1255   03/02/20 1200  vancomycin (VANCOCIN) IVPB 500 mg/100 ml premix        500 mg 100 mL/hr over 60 Minutes Intravenous Every T-Th-Sa (Hemodialysis) 03/01/20 1131 03/02/20 1710   03/01/20 1230  vancomycin (VANCOCIN) IVPB 500 mg/100 ml premix  Status:  Discontinued        500 mg 100 mL/hr over 60 Minutes Intravenous Every M-W-F (Hemodialysis) 03/01/20 1131 03/06/20 1255   02/28/20 1110  vancomycin (VANCOREADY) IVPB 500 mg/100 mL        over 60 Minutes  Continuous PRN 02/28/20 1112 02/28/20 1110   02/28/20 1101  vancomycin (VANCOCIN) 1-5 GM/200ML-% IVPB  Status:  Discontinued       Note to Pharmacy: Lytle Butte   : cabinet override       02/28/20 1101 02/28/20 1327   02/27/20 1800  cefTAZidime (FORTAZ) 1 g in sodium chloride 0.9 % 100 mL IVPB        1 g 200 mL/hr over 30 Minutes Intravenous Every 24 hours 02/27/20 0947 03/03/20 1738   02/27/20 1200  vancomycin (VANCOCIN) IVPB 500 mg/100 ml premix        500 mg 100 mL/hr over 60 Minutes Intravenous Once 02/27/20 0829 02/27/20 1343   02/24/20 1800  vancomycin (VANCOREADY) IVPB 500 mg/100 mL        500 mg 100 mL/hr over 60 Minutes Intravenous  Once 02/24/20 1308 02/24/20 1905   02/22/20 2200  metroNIDAZOLE (FLAGYL) tablet 500 mg  Status:  Discontinued        500 mg Per Tube Every 8 hours 02/22/20 0957 03/30/20 1057   02/22/20 1700  cefTRIAXone (ROCEPHIN) 2 g in sodium chloride 0.9 % 100 mL IVPB  Status:  Discontinued        2 g 200 mL/hr over 30 Minutes Intravenous Every 24 hours 02/22/20 0957 02/27/20 0947   02/22/20 1200  vancomycin (  VANCOCIN) IVPB 500 mg/100 ml premix  Status:  Discontinued        500 mg 100 mL/hr over 60 Minutes Intravenous Every T-Th-Sa (Hemodialysis) 02/20/20 1253 02/21/20 1040   02/21/20 1431  vancomycin variable dose per unstable renal function (pharmacist dosing)  Status:  Discontinued         Does not apply See admin instructions 02/21/20 1431 03/01/20 1131   02/21/20 1200  vancomycin (VANCOREADY) IVPB 500 mg/100 mL        500 mg 100 mL/hr over 60 Minutes Intravenous  Once 02/21/20 0956 02/21/20 1253   02/21/20 0714  vancomycin (VANCOCIN) 500-5 MG/100ML-% IVPB  Status:  Discontinued       Note to Pharmacy: Ashley Akin   : cabinet override      02/21/20 0714 02/21/20 1346   02/20/20 1400  vancomycin (VANCOREADY) IVPB 1250 mg/250 mL        1,250 mg 166.7 mL/hr over 90 Minutes Intravenous  Once 02/20/20 1253 02/20/20 1538   02/20/20 1200  levofloxacin (LEVAQUIN) IVPB 500 mg  Status:  Discontinued        500 mg 100 mL/hr over 60 Minutes Intravenous Every 48 hours 02/20/20 1020 02/21/20 0943   02/15/20 1545  vancomycin (VANCOCIN) IVPB 1000 mg/200  mL premix  Status:  Discontinued       "Followed by" Linked Group Details   1,000 mg 200 mL/hr over 60 Minutes Intravenous Every 24 hours 02/14/20 1530 02/14/20 1745   02/15/20 0330  ceFEPIme (MAXIPIME) 2 g in sodium chloride 0.9 % 100 mL IVPB  Status:  Discontinued        2 g 200 mL/hr over 30 Minutes Intravenous Every 12 hours 02/14/20 1531 02/14/20 1531   02/14/20 1531  ceFEPIme (MAXIPIME) 2 g in sodium chloride 0.9 % 100 mL IVPB  Status:  Discontinued        2 g 200 mL/hr over 30 Minutes Intravenous Every 12 hours 02/14/20 1531 02/14/20 1745   02/14/20 1530  ceFEPIme (MAXIPIME) 2 g in sodium chloride 0.9 % 100 mL IVPB  Status:  Discontinued        2 g 200 mL/hr over 30 Minutes Intravenous  Once 02/14/20 1520 02/14/20 1531   02/14/20 1530  vancomycin (VANCOREADY) IVPB 1500 mg/300 mL  Status:  Discontinued       "Followed by" Linked Group Details   1,500 mg 150 mL/hr over 120 Minutes Intravenous  Once 02/14/20 1530 02/14/20 1745        Time spent: 25 minutes    Erin Hearing ANP [facility] Pager [phone]. If 7PM-7AM, please contact night-coverage at [email or web address] 04/12/2020, 11:13 AM  LOS: 58 days

## 2020-04-12 NOTE — Procedures (Signed)
Patient seen on Hemodialysis. BP 128/78 (BP Location: Left Arm)   Pulse 83   Temp 98.4 F (36.9 C) (Axillary)   Resp (!) 84   Ht 5\' 9"  (1.753 m)   Wt 66.5 kg   SpO2 96%   BMI 21.65 kg/m   QB 400, UF goal 2.5L Tolerating treatment without complaints at this time.   Elmarie Shiley MD [facility]. Office # [phone] 2:28 PM

## 2020-04-13 LAB — GLUCOSE, CAPILLARY
Glucose-Capillary: 129 mg/dL — ABNORMAL HIGH (ref 70–99)
Glucose-Capillary: 123 mg/dL — ABNORMAL HIGH (ref 70–99)
Glucose-Capillary: 143 mg/dL — ABNORMAL HIGH (ref 70–99)
Glucose-Capillary: 128 mg/dL — ABNORMAL HIGH (ref 70–99)
Glucose-Capillary: 142 mg/dL — ABNORMAL HIGH (ref 70–99)

## 2020-04-13 NOTE — Progress Notes (Signed)
[facility] PROGRESS NOTE  Shane Banks OQH:[id number] DOB: 06/12/1946 DOA: 02/14/2020 PCP: Shane Border, MD  Status: Inpatient--Remains inpatient appropriate because :Unsafe d/c plan, IV treatments appropriate due to intensity of illness or inability to take PO and Inpatient level of care appropriate due to severity of illness-patient continues to have tenuous respiratory status   Dispo:  Patient From:  Home  Planned Disposition:    Expected discharge date:    Medically stable for discharge: No-needs to transition to cuffless trach (respiratory status too tenuous to accomplish), needs to be able to sit upright in recliner to pursue dialysis in the outpatient setting (profoundly physically deconditioned and unable to even lift arms at this juncture), -remains on IV antibiotics secondary to sacral osteomyelitis and decubitus and recent bacteremia with last dose due on 7/8  ADDENDUM by previous care provider" **7/15: 1520 -spoke with patient's sister Shane Banks.  Patient's adult children are unable to make it to the hospital until Wednesday July 21st.  They will visit patient and at that time family will reconvene to make decisions regarding next steps and progressing towards comfort measures as recommended.  Code Status: Full Family Communication: Shane Banks sister 7/14 and 04/11/20, brother Shane Banks on 04/02/20 DVT prophylaxis: Eliquis  HPI: 74 y.o. 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 Patient treated for Covid pneumonia January 2021 and had been on the vent because of progressive ARDS.  Because of lack of progression he underwent tracheostomy and was [facility]  [facility].  Was sent back to this facility in May due to trach site bleeding suspected hemoptysis.  He was also noted with sacral decubitus at time of presentation in May.  Since admission he has been treated for recurrent multidrug-resistant Pseudomonas pneumonia and MRSA and enterococcal bacteremia.  On 03/15/2020 patient returned to the ICU for ventilatory and pressor support he developed septic shock and developed acute renal failure requiring dialysis.  He developed sepsis physiology likely secondary to sacral decubitus ulcer with underlying osteomyelitis.    He has been weaned from pressors. PCCM is following for trach weaning with the assistance of SLP.  Patient was liberated from the vent on 03/21/2020 and was transferred to the floor in 03/22/2020.  Due to the lack of insurance approval he cannot go [facility] to [facility].  Social worker is working on Clinical research associate.  He will need to be transition to a cuffless trach which unfortunately has not been accomplished due to recurrent Shane Banks with respiratory failure physiology.  He has been deemed to not be a candidate for outpatient hemodialysis by the nephrologist.  Patient has not made any progress in mobility OT and PT have signed off.  On the morning of 7/6 patient developed hypoxemia and increased work of breathing with shallow respiratory effort, requiring frequent suctioning.  O2 sats decreased into the 70s but improved to 95% with bagging and suctioning.  Briefly required increase of FiO2 to 100% but was eventually decreased back to 40%.  Tracheal aspirate appeared consistent with tube feedings therefore tube feedings were stopped.  He was found to have multidrug-resistant Pseudomonas once again only sensitive to gent this time.  Concerns that this may be related to colonization with recommendation from ID to follow.  Patient later developed fevers within 24 hours  after that recommendation on the evening of 7/9 so at the recommendation of  infectious disease service given multidrug-resistant Pseudomonas recommendation was started on Fortaz plus [name].  Patient reevaluated by SLP on 7/14.  PMV was placed in his exhalatory effort was extremely diminished to the point he had difficulty moving air to the upper airway.  Able to demonstrate increased vocal intensity without definitive phonation.  The expiratory muscle strength trainer was used at the lowest setting of 5 cm of water with little effort but accurate.  Is able to complete 8 repetitions but reported dizziness.  Subjective: No significant events overnight as discussed with staff  Objective: Vitals:   04/13/20 1118 04/13/20 1210  BP:  (!) 131/55  Pulse: 87 82  Resp: (!) 38 (!) 42  Temp:    SpO2:  100%    Intake/Output Summary (Last 24 hours) at 04/13/2020 1219 Last data filed at 04/13/2020 0300 Gross per 24 hour  Intake 4905.92 ml  Output 2677 ml  Net 2228.92 ml   Filed Weights   04/11/20 0500 04/11/20 0721 04/11/20 1056  Weight: 69 kg 68 kg 66.5 kg    Exam:  Patient is lethargic, noncommunicative, does not follow any commands  Fair air entry bilaterally, he is on trach collar FiO2 28%  Irregular irregular, no rubs or gallops, no edema  PEG tube present, colostomy present, bowel sounds present  Extremities with no edema, clubbing or cyanosis .   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  PCCM following periodically to assess readiness for transition cuffless trach. 7/6: Significant respiratory decompensation with FiO2 increased up to 60%. Aspiration and recurrent Pseudomonas pneumonia as etiology. Of note, family refusing further conversations with palliative medicine team.  Excellent telephonic meeting with ethics physician Dr. Andria Banks on 7/14.  Please see his  note for details.  Sister made aware of no meaningful overall recovery including from a pulmonary standpoint. Has completed 8 doses of IV meropenem as of 7/9.   Repeat sputum culture obtained prior to initiation of meropenem has finalized with few MDR Pseudomonas (resistant to imipenem/Carbapenem's but sensitive to gentamicin). Patient developed low-grade fever on 7/9-discussed with ID who recommended [name] with [name] Unfortunately given recurrent pneumonia and increased secretions with associated generalized weakness/weak cough it is highly doubtful he can transition to cuffless trach at this point.  Overall prognosis is poor.  Continue chest physiotherapy with vibra vest administered per RT  Goals of care: Ethics team meeting on 7/14 as described above. I will be recontacting patient's Sister Shane Banks today prior to 4 PM to determine if family has reached a decision regarding transitioning patient to comfort care. Please see detailed notes from Dr. Andria Banks dated 7/14 Patient's children will be able to visit with him on Wednesday 7/21.  It is understood that after this visit family will reconvene and make a decision regarding transitioning to comfort care  Diarrhea Likely noninfectious in etiology-C. difficile negative Suspect related to antibiotics and other medications bolus tube feeding Continues to have watery stools per colostomy 7/14 cholestyramine twice daily initiated Continue maintenance fluids at 30 cc/h  Mild funguria/oral yeast Urine culture with 30,000 colonies of yeast Continue Diflucan-had started last week but fell off medication list and was resumed on 7/8  Sacral osteomyelitis and HD catheter sepsis/  Enterococcal bacteremia: Tunneled catheter removal on 02/21/2020. Has tunneled HD catheter right subclavian-side unremarkable Completed  6 weeks of IV vancomycin and Flagyl 7/8  Paroxysmal atrial fibrillation and flutter/Acquired thrombophilia: Currently rate controlled on  metoprolol Anticoagulation previously discontinued due to suspected GI bleed and was resumed 5/31 and patient has tolerated it any signs of bleeding.  Chronic kidney disease stage V now requiring hemodialysis 2/2 ATN and AKI from IV gentamicin: On HD since March 2021 status post tunneled catheter placement 02/28/2020 and as of 7/1 site unremarkable. Dialysis days: MWF SNF reported patient will need to be able to sit upright in recliner to receive hemodialysis before they will accept him as a patient-patient has been unable to meet this goal and is not making any progress in regards to physical abilities-OT/PT has signed off Unfortunately, this patient continues to decline, he has significant weakness and is unable to tolerate sitting up in a recliner receive outpatient hemodialysis.  Attending nephrologist has documented that patient is not a candidate for outpatient hemodialysis **On TTS schedule this week then back to MWF next week  Chronic normocytic anemia: Due to anemia of chronic renal disease Status post 1 unit of PRBCs Globin remained stable at 9.4 on 7/16 Continue erythropoietin and IV iron per renal.  History of nonhemorrhagic CVA: CT of the head showed atrophy and chronic microvascular disease. 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 Documented as full-thickness tissue loss  Severe protein caloric malnutrition: Tube feedings on hold due to concerns of a possible aspiration as above.  Patient is dialysis patient but will allow D5 normal saline at 30 cc/h to replace tube feeding volume loss Will allow medications per tube with small amounts of flush Nutrition Status: Nutrition Problem: Severe Malnutrition Etiology: chronic illness (chronic respiratory failure related to COVID ARDS) Signs/Symptoms: severe muscle depletion, severe fat depletion Interventions: Prostat, Tube feeding, Juven-continue  tube feeding and check residuals every 4 hours      Data Reviewed: Basic Metabolic Panel: Recent Labs  Lab 04/07/20 0654 04/08/20 1031 04/10/20 1010 04/11/20 0730 04/12/20 0543  NA 134* 134* 131* 128* 130*  K 3.9 4.0 3.9 3.7 3.4*  CL 100 99 96* 94* 98  CO2 23 27 26 25 24   GLUCOSE 117* 91 131* 117* 106*  BUN 79* 92* 91* 116* 57*  CREATININE 2.57* 3.20* 2.73* 2.99* 1.87*  CALCIUM 9.4 9.2 9.3 9.3 9.2  MG 2.6*  --   --   --   --   PHOS  --  4.4 4.4 4.6 2.7   Liver Function Tests: Recent Labs  Lab 04/07/20 0654 04/08/20 1031 04/10/20 1010 04/11/20 0730 04/12/20 0543  AST 33  --   --   --   --   ALT 20  --   --   --   --   ALKPHOS 110  --   --   --   --   BILITOT 0.5  --   --   --   --   PROT 7.4  --   --   --   --   ALBUMIN 2.1* 1.9* 1.9* 1.8* 2.0*   No results for input(s): LIPASE, AMYLASE in the last 168 hours. No results for input(s): AMMONIA in the last 168 hours. CBC: Recent Labs  Lab 04/07/20 0654 04/08/20 1031 04/10/20 1010 04/11/20 0730 04/12/20 0543  WBC 16.8* 12.3* 13.7* 13.7* 18.3*  NEUTROABS 11.3*  --   --   --   --   HGB 9.4* 8.5* 9.1* 8.4* 9.4*  HCT 31.3* 28.1* 30.0*  28.0* 30.4*  MCV 81.7 80.3 79.6* 80.5 79.8*  PLT 275 261 265 280 302   Cardiac Enzymes: No results for input(s): CKTOTAL, CKMB, CKMBINDEX, TROPONINI in the last 168 hours. BNP (last 3 results) No results for input(s): BNP in the last 8760 hours.  ProBNP (last 3 results) No results for input(s): PROBNP in the last 8760 hours.  CBG: Recent Labs  Lab 04/12/20 1140 04/12/20 2001 04/12/20 2346 04/13/20 0414 04/13/20 0836  GLUCAP 112* 162* 149* 129* 123*    No results found for this or any previous visit (from the past 240 hour(s)).   Studies: No results found.  Scheduled Meds: . apixaban  2.5 mg Per Tube BID  . chlorhexidine  15 mL Mouth Rinse BID  . chlorhexidine gluconate (MEDLINE KIT)  15 mL Mouth Rinse BID  . Chlorhexidine Gluconate Cloth  6 each Topical Q0600   . cholestyramine  4 g Per Tube BID  . darbepoetin (ARANESP) injection - DIALYSIS  100 mcg Intravenous Q Mon-HD  . feeding supplement (PROSource TF)  45 mL Per Tube TID  . fluconazole  100 mg Per Tube Daily  . insulin aspart  0-6 Units Subcutaneous Q4H  . insulin aspart  2 Units Subcutaneous Q4H  . levothyroxine  25 mcg Per Tube Q0600  . lipase/protease/amylase)  20,880 Units Per Tube Once   And  . sodium bicarbonate  650 mg Per Tube Once  . mouth rinse  15 mL Mouth Rinse q12n4p  . metoprolol tartrate  25 mg Per Tube BID  . nutrition supplement (JUVEN)  1 packet Per Tube BID BM  . pantoprazole sodium  40 mg Per Tube Daily  . phenylephrine 200 mcg / ml (NON-ED USE ONLY) injection  100 mcg Intracavernosal Once   Continuous Infusions: . ceftazidime avibactam (AVYCAZ) IVPB 0.94 g (04/13/20 1016)  . dextrose 30 mL/hr at 04/12/20 0930  . feeding supplement (NEPRO CARB STEADY) 1,000 mL (04/12/20 1845)    Principal Problem:   Enterococcal bacteremia Active Problems:   Acute respiratory failure (HCC)   Hemoptysis   Pressure injury of skin   Protein-calorie malnutrition, severe   Decubitus ulcer of sacral region, stage 4 ([place])   Fever   Palliative care by specialist   Goals of care, counseling/discussion   DNR (do not resuscitate)     Code Status: Full Family Communication: Shane Banks sister 03/29/20, brother Shane Banks on 04/02/20 DVT prophylaxis: Eliquis    Consultants: PCCM Ethics committee/Dr. Andria Banks  Procedures: Echocardiogram 1. Left ventricular ejection fraction, by estimation, is 60 to 65%. The  left ventricle has normal function. The left ventricle has no regional  wall motion abnormalities. There is severe left ventricular hypertrophy.  Left ventricular diastolic parameters  are consistent with Grade I diastolic dysfunction (impaired relaxation).  2. Right ventricular systolic function is normal. The right ventricular  size is normal. There is mildly  elevated pulmonary artery systolic  pressure.  3. Left atrial size was mildly dilated.  4. Right atrial size was mildly dilated.  5. The mitral valve is grossly normal. Trivial mitral valve  regurgitation.  6. The aortic valve is tricuspid. Aortic valve regurgitation is trivial.   Antibiotics: Anti-infectives (From admission, onward)   Start     Dose/Rate Route Frequency Ordered Stop   04/12/20 0900  ceftazidime-avibactam (AVYCAZ) 0.94 g in dextrose 5 % 50 mL IVPB     Discontinue     0.94 g 25 mL/hr over 2 Hours Intravenous Every 24 hours 04/11/20 1225  04/10/20 2100  ceftazidime-avibactam (AVYCAZ) 0.94 g in dextrose 5 % 50 mL IVPB  Status:  Discontinued        0.94 g 25 mL/hr over 2 Hours Intravenous Every 12 hours 04/10/20 2005 04/11/20 1225   04/08/20 1420  ceftazidime-avibactam (AVYCAZ) 0.94 g in dextrose 5 % 50 mL IVPB  Status:  Discontinued        0.94 g 25 mL/hr over 2 Hours Intravenous Daily at bedtime 04/08/20 1420 04/10/20 2005   04/07/20 1800  ceftazidime-avibactam (AVYCAZ) 0.94 g in dextrose 5 % 50 mL IVPB  Status:  Discontinued        0.94 g 25 mL/hr over 2 Hours Intravenous Every 12 hours 04/07/20 1322 04/08/20 1420   04/05/20 1000  fluconazole (DIFLUCAN) 40 MG/ML suspension 100 mg     Discontinue    "Followed by" Linked Group Details   100 mg Per Tube Daily 04/04/20 2203     04/04/20 2300  fluconazole (DIFLUCAN) 40 MG/ML suspension 200 mg       "Followed by" Linked Group Details   200 mg Per Tube  Once 04/04/20 2203 04/04/20 2325   04/01/20 1013  vancomycin (VANCOCIN) 500-5 MG/100ML-% IVPB       Note to Pharmacy: Cherylann Banas   : cabinet override      04/01/20 1013 04/01/20 1253   03/29/20 1552  vancomycin (VANCOCIN) 500-5 MG/100ML-% IVPB       Note to Pharmacy: Anthonette Legato, Melisa   : cabinet override      03/29/20 1552 03/29/20 1556   03/29/20 1415  fluconazole (DIFLUCAN) 40 MG/ML suspension 100 mg        100 mg Oral Daily 03/29/20 1408 03/31/20 1015    03/28/20 2000  meropenem (MERREM) 500 mg in sodium chloride 0.9 % 100 mL IVPB        500 mg 200 mL/hr over 30 Minutes Intravenous Every 24 hours 03/28/20 1623 04/04/20 2201   03/25/20 1800  cefTAZidime (FORTAZ) 1 g in sodium chloride 0.9 % 100 mL IVPB  Status:  Discontinued       "Followed by" Linked Group Details   1 g 200 mL/hr over 30 Minutes Intravenous Every 24 hours 03/18/20 1015 03/28/20 1613   03/22/20 0830  vancomycin (VANCOREADY) IVPB 500 mg/100 mL        500 mg 100 mL/hr over 60 Minutes Intravenous  Once 03/22/20 0827 03/22/20 1014   03/20/20 1200  vancomycin (VANCOCIN) IVPB 500 mg/100 ml premix        500 mg 100 mL/hr over 60 Minutes Intravenous Every M-W-F (Hemodialysis) 03/19/20 0941 04/03/20 1500   03/18/20 1800  meropenem (MERREM) 500 mg in sodium chloride 0.9 % 100 mL IVPB       "Followed by" Linked Group Details   500 mg 200 mL/hr over 30 Minutes Intravenous Every 24 hours 03/18/20 1015 03/25/20 0026   03/18/20 1200  vancomycin (VANCOCIN) IVPB 500 mg/100 ml premix        500 mg 100 mL/hr over 60 Minutes Intravenous Every M-W-F (Hemodialysis) 03/18/20 0938 03/18/20 1700   03/18/20 1200  cefTAZidime (FORTAZ) 2 g in sodium chloride 0.9 % 100 mL IVPB  Status:  Discontinued        2 g 200 mL/hr over 30 Minutes Intravenous Every Mon (Hemodialysis) 03/18/20 0939 03/18/20 1015   03/07/20 1800  cefTAZidime (FORTAZ) 2 g in sodium chloride 0.9 % 100 mL IVPB  Status:  Discontinued        2 g 200  mL/hr over 30 Minutes Intravenous Every T-Th-Sa (1800) 03/06/20 1255 03/18/20 1015   03/07/20 1200  vancomycin (VANCOCIN) IVPB 500 mg/100 ml premix  Status:  Discontinued        500 mg 100 mL/hr over 60 Minutes Intravenous Every T-Th-Sa (Hemodialysis) 03/06/20 1255 03/19/20 0941   03/04/20 2000  cefTAZidime (FORTAZ) 2 g in sodium chloride 0.9 % 100 mL IVPB  Status:  Discontinued        2 g 200 mL/hr over 30 Minutes Intravenous Every M-W-F (2000) 03/01/20 1136 03/06/20 1255   03/02/20  1200  vancomycin (VANCOCIN) IVPB 500 mg/100 ml premix        500 mg 100 mL/hr over 60 Minutes Intravenous Every T-Th-Sa (Hemodialysis) 03/01/20 1131 03/02/20 1710   03/01/20 1230  vancomycin (VANCOCIN) IVPB 500 mg/100 ml premix  Status:  Discontinued        500 mg 100 mL/hr over 60 Minutes Intravenous Every M-W-F (Hemodialysis) 03/01/20 1131 03/06/20 1255   02/28/20 1110  vancomycin (VANCOREADY) IVPB 500 mg/100 mL        over 60 Minutes  Continuous PRN 02/28/20 1112 02/28/20 1110   02/28/20 1101  vancomycin (VANCOCIN) 1-5 GM/200ML-% IVPB  Status:  Discontinued       Note to Pharmacy: Lytle Butte   : cabinet override      02/28/20 1101 02/28/20 1327   02/27/20 1800  cefTAZidime (FORTAZ) 1 g in sodium chloride 0.9 % 100 mL IVPB        1 g 200 mL/hr over 30 Minutes Intravenous Every 24 hours 02/27/20 0947 03/03/20 1738   02/27/20 1200  vancomycin (VANCOCIN) IVPB 500 mg/100 ml premix        500 mg 100 mL/hr over 60 Minutes Intravenous Once 02/27/20 0829 02/27/20 1343   02/24/20 1800  vancomycin (VANCOREADY) IVPB 500 mg/100 mL        500 mg 100 mL/hr over 60 Minutes Intravenous  Once 02/24/20 1308 02/24/20 1905   02/22/20 2200  metroNIDAZOLE (FLAGYL) tablet 500 mg  Status:  Discontinued        500 mg Per Tube Every 8 hours 02/22/20 0957 03/30/20 1057   02/22/20 1700  cefTRIAXone (ROCEPHIN) 2 g in sodium chloride 0.9 % 100 mL IVPB  Status:  Discontinued        2 g 200 mL/hr over 30 Minutes Intravenous Every 24 hours 02/22/20 0957 02/27/20 0947   02/22/20 1200  vancomycin (VANCOCIN) IVPB 500 mg/100 ml premix  Status:  Discontinued        500 mg 100 mL/hr over 60 Minutes Intravenous Every T-Th-Sa (Hemodialysis) 02/20/20 1253 02/21/20 1040   02/21/20 1431  vancomycin variable dose per unstable renal function (pharmacist dosing)  Status:  Discontinued         Does not apply See admin instructions 02/21/20 1431 03/01/20 1131   02/21/20 1200  vancomycin (VANCOREADY) IVPB 500 mg/100 mL         500 mg 100 mL/hr over 60 Minutes Intravenous  Once 02/21/20 0956 02/21/20 1253   02/21/20 0714  vancomycin (VANCOCIN) 500-5 MG/100ML-% IVPB  Status:  Discontinued       Note to Pharmacy: Ashley Akin   : cabinet override      02/21/20 0714 02/21/20 1346   02/20/20 1400  vancomycin (VANCOREADY) IVPB 1250 mg/250 mL        1,250 mg 166.7 mL/hr over 90 Minutes Intravenous  Once 02/20/20 1253 02/20/20 1538   02/20/20 1200  levofloxacin (LEVAQUIN) IVPB 500 mg  Status:  Discontinued        500 mg 100 mL/hr over 60 Minutes Intravenous Every 48 hours 02/20/20 1020 02/21/20 0943   02/15/20 1545  vancomycin (VANCOCIN) IVPB 1000 mg/200 mL premix  Status:  Discontinued       "Followed by" Linked Group Details   1,000 mg 200 mL/hr over 60 Minutes Intravenous Every 24 hours 02/14/20 1530 02/14/20 1745   02/15/20 0330  ceFEPIme (MAXIPIME) 2 g in sodium chloride 0.9 % 100 mL IVPB  Status:  Discontinued        2 g 200 mL/hr over 30 Minutes Intravenous Every 12 hours 02/14/20 1531 02/14/20 1531   02/14/20 1531  ceFEPIme (MAXIPIME) 2 g in sodium chloride 0.9 % 100 mL IVPB  Status:  Discontinued        2 g 200 mL/hr over 30 Minutes Intravenous Every 12 hours 02/14/20 1531 02/14/20 1745   02/14/20 1530  ceFEPIme (MAXIPIME) 2 g in sodium chloride 0.9 % 100 mL IVPB  Status:  Discontinued        2 g 200 mL/hr over 30 Minutes Intravenous  Once 02/14/20 1520 02/14/20 1531   02/14/20 1530  vancomycin (VANCOREADY) IVPB 1500 mg/300 mL  Status:  Discontinued       "Followed by" Linked Group Details   1,500 mg 150 mL/hr over 120 Minutes Intravenous  Once 02/14/20 1530 02/14/20 Schaller Nyair Depaulo MD [facility]  If 7PM-7AM, please contact night-coverage at [email or web address] 04/13/2020, 12:19 PM  LOS: 59 days

## 2020-04-13 NOTE — Progress Notes (Signed)
Patient ID: Shane Banks, male   DOB: March 07, 1946, 74 y.o.   MRN: [id number]  [facility] Progress Note   Assessment/ Plan:   1.  Acute on chronic hypoxic respiratory failure: Status post tracheostomy with history of recurrent HCAP. 2. ESRD: Usually on a Monday/Wednesday/Friday schedule and now back on schedule with dialysis done yesterday-no acute indications for dialysis today.  Given his current deconditioning and nutritional needs as well as ongoing tracheostomy care requirements; he is not in a position for outpatient hemodialysis placement. 3. Anemia: Likely secondary to underlying end-stage renal disease and compounded by acute illness/inflammatory component.  Continue ESA. 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  Continue to follow off of vitamin D receptor analogs. 5. Nutrition: Remains n.p.o. due to concerns of recurrent aspiration, nutrition per feeding tube. 6. Hypertension: Blood pressures within acceptable range, will continue efforts at volume unloading to match input and help alleviate edema.  Subjective:   Without acute events noted overnight and underwent hemodialysis yesterday. Turns head towards me when I call out his name but otherwise without any responses.   Objective:   BP 128/77   Pulse 86   Temp 97.8 F (36.6 C) (Oral)   Resp (!) 27   Ht 5' 9"  (1.753 m)   Wt 66.5 kg   SpO2 100%   BMI 21.65 kg/m   Physical Exam: Gen: Chronically ill-appearing and cachectic man with tracheostomy. CVS: Pulse regular rhythm, normal rate, S1 and S2 normal Resp: Coarse breath sounds bilaterally, no distinct rales or rhonchi.  Right IJ TDC Abd: Soft, colostomy bag in situ.  Feeding tube in situ Ext: Trace upper extremity and lower extremity edema bilaterally with some dependent edema.  Labs: BMET Recent Labs  Lab 04/07/20 0654 04/08/20 1031 04/10/20 1010 04/11/20 0730  04/12/20 0543  NA 134* 134* 131* 128* 130*  K 3.9 4.0 3.9 3.7 3.4*  CL 100 99 96* 94* 98  CO2 23 27 26 25 24   GLUCOSE 117* 91 131* 117* 106*  BUN 79* 92* 91* 116* 57*  CREATININE 2.57* 3.20* 2.73* 2.99* 1.87*  CALCIUM 9.4 9.2 9.3 9.3 9.2  PHOS  --  4.4 4.4 4.6 2.7   CBC Recent Labs  Lab 04/07/20 0654 04/07/20 0654 04/08/20 1031 04/10/20 1010 04/11/20 0730 04/12/20 0543  WBC 16.8*   < > 12.3* 13.7* 13.7* 18.3*  NEUTROABS 11.3*  --   --   --   --   --   HGB 9.4*   < > 8.5* 9.1* 8.4* 9.4*  HCT 31.3*   < > 28.1* 30.0* 28.0* 30.4*  MCV 81.7   < > 80.3 79.6* 80.5 79.8*  PLT 275   < > 261 265 280 302   < > = values in this interval not displayed.      Medications:    . apixaban  2.5 mg Per Tube BID  . chlorhexidine  15 mL Mouth Rinse BID  . chlorhexidine gluconate (MEDLINE KIT)  15 mL Mouth Rinse BID  . Chlorhexidine Gluconate Cloth  6 each Topical Q0600  . cholestyramine  4 g Per Tube BID  . darbepoetin (ARANESP) injection - DIALYSIS  100 mcg Intravenous Q Mon-HD  . feeding supplement (PROSource TF)  45 mL Per Tube TID  . fluconazole  100 mg Per Tube Daily  . insulin aspart  0-6 Units Subcutaneous Q4H  . insulin aspart  2 Units Subcutaneous Q4H  . levothyroxine  25 mcg Per Tube Q0600  . lipase/protease/amylase)  20,880 Units Per Tube Once   And  . sodium bicarbonate  650 mg Per Tube Once  . mouth rinse  15 mL Mouth Rinse q12n4p  . metoprolol tartrate  25 mg Per Tube BID  . nutrition supplement (JUVEN)  1 packet Per Tube BID BM  . pantoprazole sodium  40 mg Per Tube Daily  . phenylephrine 200 mcg / ml (NON-ED USE ONLY) injection  100 mcg Intracavernosal Once     Elmarie Shiley, MD 04/13/2020, 7:34 AM

## 2020-04-14 LAB — GLUCOSE, CAPILLARY
Glucose-Capillary: 112 mg/dL — ABNORMAL HIGH (ref 70–99)
Glucose-Capillary: 113 mg/dL — ABNORMAL HIGH (ref 70–99)
Glucose-Capillary: 116 mg/dL — ABNORMAL HIGH (ref 70–99)
Glucose-Capillary: 117 mg/dL — ABNORMAL HIGH (ref 70–99)
Glucose-Capillary: 122 mg/dL — ABNORMAL HIGH (ref 70–99)
Glucose-Capillary: 156 mg/dL — ABNORMAL HIGH (ref 70–99)

## 2020-04-14 NOTE — Progress Notes (Signed)
[facility] PROGRESS NOTE  Shane Banks ERD:[id number] DOB: [birth year] DOA: 02/14/2020 PCP: Shane Border, MD  Status: Inpatient--Remains inpatient appropriate because :Unsafe d/c plan, IV treatments appropriate due to intensity of illness or inability to take PO and Inpatient level of care appropriate due to severity of illness-patient continues to have tenuous respiratory status   Dispo:  Patient From:  Home  Planned Disposition:    Expected discharge date:    Medically stable for discharge: No-needs to transition to cuffless trach (respiratory status too tenuous to accomplish), needs to be able to sit upright in recliner to pursue dialysis in the outpatient setting (profoundly physically deconditioned and unable to even lift arms at this juncture), -remains on IV antibiotics secondary to sacral osteomyelitis and decubitus and recent bacteremia with last dose due on 7/8  ADDENDUM by previous care provider" **7/15: 1520 -spoke with patient's sister Shane Banks.  Patient's adult children are unable to make it to the hospital until Wednesday July 21st.  They will visit patient and at that time family will reconvene to make decisions regarding next steps and progressing towards comfort measures as recommended.  Code Status: Full Family Communication: None at bedside. DVT prophylaxis: Eliquis  HPI: 74 y.o. 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 Patient treated for Covid pneumonia January 2021 and had been on the vent because of progressive ARDS.  Because of lack of progression he underwent tracheostomy and was [facility].  Was sent back to this facility in May due  to trach site bleeding suspected hemoptysis.  He was also noted with sacral decubitus at time of presentation in May.  Since admission he has been treated for recurrent multidrug-resistant Pseudomonas pneumonia and MRSA and enterococcal bacteremia.  On 03/15/2020 patient returned to the ICU for ventilatory and pressor support he developed septic shock and developed acute renal failure requiring dialysis.  He developed sepsis physiology likely secondary to sacral decubitus ulcer with underlying osteomyelitis.    He has been weaned from pressors. PCCM is following for trach weaning with the assistance of SLP.  Patient was liberated from the vent on 03/21/2020 and was transferred to the floor in 03/22/2020.  Due to the lack of insurance approval he cannot go back to [facility].  Social worker is working on Clinical research associate.  He will need to be transition to a cuffless trach which unfortunately has not been accomplished due to recurrent [name] with respiratory failure physiology.  He has been deemed to not be a candidate for outpatient hemodialysis by the nephrologist.  Patient has not made any progress in mobility OT and PT have signed off.  On the morning of 7/6 patient developed hypoxemia and increased work of breathing with shallow respiratory effort, requiring frequent suctioning.  O2 sats decreased into the 70s but improved to 95% with bagging and suctioning.  Briefly required increase of FiO2 to 100% but was eventually decreased back to 40%.  Tracheal aspirate appeared consistent with tube feedings therefore tube feedings were stopped.  He was found to have multidrug-resistant Pseudomonas once again only sensitive to gent this time.  Concerns that this may be related to colonization with recommendation from ID to follow.  Patient later developed fevers within 24 hours after that recommendation on the evening of 7/9  so at the recommendation of infectious disease service given multidrug-resistant  Pseudomonas recommendation was started on Fortaz plus [name].  Patient reevaluated by SLP on 7/14.  PMV was placed in his exhalatory effort was extremely diminished to the point he had difficulty moving air to the upper airway.  Able to demonstrate increased vocal intensity without definitive phonation.  The expiratory muscle strength trainer was used at the lowest setting of 5 cm of water with little effort but accurate.  Is able to complete 8 repetitions but reported dizziness.  Subjective: No significant events overnight as discussed with staff  Objective: Vitals:   04/14/20 0816 04/14/20 1100  BP:    Pulse: 82 81  Resp: (!) 38 (!) 25  Temp:    SpO2: 100% 100%    Intake/Output Summary (Last 24 hours) at 04/14/2020 1350 Last data filed at 04/13/2020 1900 Gross per 24 hour  Intake --  Output 150 ml  Net -150 ml   Filed Weights   04/11/20 0500 04/11/20 0721 04/11/20 1056  Weight: 69 kg 68 kg 66.5 kg    Exam:  Patient eyes are open today, he remains altered, but he able to close her his eyes on command . 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  PCCM following periodically to assess readiness for transition cuffless trach. 7/6: Significant respiratory decompensation with FiO2 increased up to 60%. Aspiration and recurrent Pseudomonas pneumonia as etiology. Of note, family refusing further conversations with palliative medicine team.  Excellent telephonic meeting with ethics physician Dr. Andria Banks on 7/14.  Please see his note for details.  Sister made aware of no meaningful overall  recovery including from a pulmonary standpoint. Has completed 8 doses of IV meropenem as of 7/9.   Repeat sputum culture obtained prior to initiation of meropenem has finalized with few MDR Pseudomonas (resistant to imipenem/Carbapenem's but sensitive to gentamicin).  Patient with recurrent HCAP, he is currently being treated with [name] Unfortunately given recurrent pneumonia and increased secretions with associated generalized weakness/weak cough it is highly doubtful he can transition to cuffless trach at this point.  Overall prognosis is poor.  Continue chest physiotherapy with vibra vest administered per RT  Goals of care: Ethics team meeting on 7/14 as described above. I will be recontacting patient's Sister Shane Banks today prior to 4 PM to determine if family has reached a decision regarding transitioning patient to comfort care. Please see detailed notes from Dr. Andria Banks dated 7/14 Patient's children will be able to visit with him on Wednesday 7/21.  It is understood that after this visit family will reconvene and make a decision regarding transitioning to comfort care  Diarrhea Likely noninfectious in etiology-C. difficile negative Suspect related to antibiotics and other medications bolus tube feeding Continues to have watery stools per colostomy 7/14 cholestyramine twice daily initiated Continue maintenance fluids at 30 cc/h  Mild funguria/oral yeast Urine culture with 30,000 colonies of yeast Continue Diflucan-had started last week but fell off medication list and was resumed on 7/8  Sacral osteomyelitis and HD catheter sepsis/  Enterococcal bacteremia: Tunneled catheter removal on 02/21/2020. Has tunneled HD catheter right subclavian-side unremarkable Completed 6 weeks of IV vancomycin and Flagyl  7/8  Paroxysmal atrial fibrillation and flutter/Acquired thrombophilia: Currently rate controlled on metoprolol Anticoagulation previously discontinued due to suspected GI bleed and  was resumed 5/31 and patient has tolerated it any signs of bleeding.  Chronic kidney disease stage V now requiring hemodialysis 2/2 ATN and AKI from IV gentamicin: On HD since March 2021 status post tunneled catheter placement 02/28/2020 and as of 7/1 site unremarkable. Dialysis days: MWF SNF reported patient will need to be able to sit upright in recliner to receive hemodialysis before they will accept him as a patient-patient has been unable to meet this goal and is not making any progress in regards to physical abilities-OT/PT has signed off Unfortunately, this patient continues to decline, he has significant weakness and is unable to tolerate sitting up in a recliner receive outpatient hemodialysis.  Attending nephrologist has documented that patient is not a candidate for outpatient hemodialysis **On TTS schedule this week then back to MWF next week  Chronic normocytic anemia: Due to anemia of chronic renal disease Status post 1 unit of PRBCs Globin remained stable at 9.4 on 7/16 Continue erythropoietin and IV iron per renal.  History of nonhemorrhagic CVA: CT of the head showed atrophy and chronic microvascular disease. 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 Documented as full-thickness tissue loss  Severe protein caloric malnutrition: Tube feedings on hold due to concerns of a possible aspiration as above.  Patient is dialysis patient but will allow D5 normal saline at 30 cc/h to replace tube feeding volume loss Will allow medications per tube with small amounts of flush Nutrition Status: Nutrition Problem: Severe Malnutrition Etiology: chronic illness (chronic respiratory failure related to COVID ARDS) Signs/Symptoms: severe muscle depletion, severe fat depletion Interventions: Prostat, Tube feeding, Juven-continue tube feeding and check residuals every 4 hours      Data Reviewed: Basic  Metabolic Panel: Recent Labs  Lab 04/08/20 1031 04/10/20 1010 04/11/20 0730 04/12/20 0543  NA 134* 131* 128* 130*  K 4.0 3.9 3.7 3.4*  CL 99 96* 94* 98  CO2 27 26 25 24   GLUCOSE 91 131* 117* 106*  BUN 92* 91* 116* 57*  CREATININE 3.20* 2.73* 2.99* 1.87*  CALCIUM 9.2 9.3 9.3 9.2  PHOS 4.4 4.4 4.6 2.7   Liver Function Tests: Recent Labs  Lab 04/08/20 1031 04/10/20 1010 04/11/20 0730 04/12/20 0543  ALBUMIN 1.9* 1.9* 1.8* 2.0*   No results for input(s): LIPASE, AMYLASE in the last 168 hours. No results for input(s): AMMONIA in the last 168 hours. CBC: Recent Labs  Lab 04/08/20 1031 04/10/20 1010 04/11/20 0730 04/12/20 0543  WBC 12.3* 13.7* 13.7* 18.3*  HGB 8.5* 9.1* 8.4* 9.4*  HCT 28.1* 30.0* 28.0* 30.4*  MCV 80.3 79.6* 80.5 79.8*  PLT 261 265 280 302   Cardiac Enzymes: No results for input(s): CKTOTAL, CKMB, CKMBINDEX, TROPONINI in the last 168 hours. BNP (last 3 results) No results for input(s): BNP in the last 8760 hours.  ProBNP (last 3 results) No results for input(s): PROBNP in the last 8760 hours.  CBG: Recent Labs  Lab 04/13/20 2218 04/14/20 0013 04/14/20 0425 04/14/20 0730 04/14/20 1132  GLUCAP 142* 113* 156* 116* 117*    No results found for this or any previous visit (from the past 240 hour(s)).   Studies: No results found.  Scheduled Meds: . apixaban  2.5 mg Per Tube BID  . chlorhexidine  15 mL Mouth Rinse BID  . chlorhexidine gluconate (MEDLINE KIT)  15 mL Mouth Rinse BID  . Chlorhexidine Gluconate Cloth  6 each Topical Q0600  . cholestyramine  4 g Per Tube BID  . darbepoetin (ARANESP) injection - DIALYSIS  100 mcg Intravenous Q Mon-HD  . feeding supplement (PROSource TF)  45 mL Per Tube TID  . fluconazole  100 mg Per Tube Daily  . insulin aspart  0-6 Units Subcutaneous Q4H  . insulin aspart  2 Units Subcutaneous Q4H  . levothyroxine  25 mcg Per Tube Q0600  . lipase/protease/amylase)  20,880 Units Per Tube Once   And  . sodium  bicarbonate  650 mg Per Tube Once  . mouth rinse  15 mL Mouth Rinse q12n4p  . metoprolol tartrate  25 mg Per Tube BID  . nutrition supplement (JUVEN)  1 packet Per Tube BID BM  . pantoprazole sodium  40 mg Per Tube Daily  . phenylephrine 200 mcg / ml (NON-ED USE ONLY) injection  100 mcg Intracavernosal Once   Continuous Infusions: . ceftazidime avibactam (AVYCAZ) IVPB 0.94 g (04/14/20 1013)  . dextrose 20 mL/hr at 04/14/20 1020  . feeding supplement (NEPRO CARB STEADY) 1,000 mL (04/13/20 1605)    Principal Problem:   Enterococcal bacteremia Active Problems:   Acute respiratory failure (HCC)   Hemoptysis   Pressure injury of skin   Protein-calorie malnutrition, severe   Decubitus ulcer of sacral region, stage 4 ([place])   Fever   Palliative care by specialist   Goals of care, counseling/discussion   DNR (do not resuscitate)     Code Status: Full Family Communication: [name] sister 03/29/20, brother [name] on 04/02/20 DVT prophylaxis: Eliquis    Consultants: PCCM Ethics committee/Dr. Andria Banks  Procedures: Echocardiogram 1. Left ventricular ejection fraction, by estimation, is 60 to 65%. The  left ventricle has normal function. The left ventricle has no regional  wall motion abnormalities. There is severe left ventricular hypertrophy.  Left ventricular diastolic parameters  are consistent with Grade I diastolic dysfunction (impaired relaxation).  2. Right ventricular systolic function is normal. The right ventricular  size is normal. There is mildly elevated pulmonary artery systolic  pressure.  3. Left atrial size was mildly dilated.  4. Right atrial size was mildly dilated.  5. The mitral valve is grossly normal. Trivial mitral valve  regurgitation.  6. The aortic valve is tricuspid. Aortic valve regurgitation is trivial.   Antibiotics: Anti-infectives (From admission, onward)   Start     Dose/Rate Route Frequency Ordered Stop   04/12/20 0900   ceftazidime-avibactam (AVYCAZ) 0.94 g in dextrose 5 % 50 mL IVPB     Discontinue     0.94 g 25 mL/hr over 2 Hours Intravenous Every 24 hours 04/11/20 1225     04/10/20 2100  ceftazidime-avibactam (AVYCAZ) 0.94 g in dextrose 5 % 50 mL IVPB  Status:  Discontinued        0.94 g 25 mL/hr over 2 Hours Intravenous Every 12 hours 04/10/20 2005 04/11/20 1225   04/08/20 1420  ceftazidime-avibactam (AVYCAZ) 0.94 g in dextrose 5 % 50 mL IVPB  Status:  Discontinued        0.94 g 25 mL/hr over 2 Hours Intravenous Daily at bedtime 04/08/20 1420 04/10/20 2005   04/07/20 1800  ceftazidime-avibactam (AVYCAZ) 0.94 g in dextrose 5 % 50 mL IVPB  Status:  Discontinued        0.94 g 25 mL/hr over 2 Hours Intravenous Every 12 hours 04/07/20 1322 04/08/20 1420   04/05/20 1000  fluconazole (DIFLUCAN) 40  MG/ML suspension 100 mg     Discontinue    "Followed by" Linked Group Details   100 mg Per Tube Daily 04/04/20 2203     04/04/20 2300  fluconazole (DIFLUCAN) 40 MG/ML suspension 200 mg       "Followed by" Linked Group Details   200 mg Per Tube  Once 04/04/20 2203 04/04/20 2325   04/01/20 1013  vancomycin (VANCOCIN) 500-5 MG/100ML-% IVPB       Note to Pharmacy: Cherylann Banas   : cabinet override      04/01/20 1013 04/01/20 1253   03/29/20 1552  vancomycin (VANCOCIN) 500-5 MG/100ML-% IVPB       Note to Pharmacy: Anthonette Legato, Melisa   : cabinet override      03/29/20 1552 03/29/20 1556   03/29/20 1415  fluconazole (DIFLUCAN) 40 MG/ML suspension 100 mg        100 mg Oral Daily 03/29/20 1408 03/31/20 1015   03/28/20 2000  meropenem (MERREM) 500 mg in sodium chloride 0.9 % 100 mL IVPB        500 mg 200 mL/hr over 30 Minutes Intravenous Every 24 hours 03/28/20 1623 04/04/20 2201   03/25/20 1800  cefTAZidime (FORTAZ) 1 g in sodium chloride 0.9 % 100 mL IVPB  Status:  Discontinued       "Followed by" Linked Group Details   1 g 200 mL/hr over 30 Minutes Intravenous Every 24 hours 03/18/20 1015 03/28/20 1613   03/22/20 0830   vancomycin (VANCOREADY) IVPB 500 mg/100 mL        500 mg 100 mL/hr over 60 Minutes Intravenous  Once 03/22/20 0827 03/22/20 1014   03/20/20 1200  vancomycin (VANCOCIN) IVPB 500 mg/100 ml premix        500 mg 100 mL/hr over 60 Minutes Intravenous Every M-W-F (Hemodialysis) 03/19/20 0941 04/03/20 1500   03/18/20 1800  meropenem (MERREM) 500 mg in sodium chloride 0.9 % 100 mL IVPB       "Followed by" Linked Group Details   500 mg 200 mL/hr over 30 Minutes Intravenous Every 24 hours 03/18/20 1015 03/25/20 0026   03/18/20 1200  vancomycin (VANCOCIN) IVPB 500 mg/100 ml premix        500 mg 100 mL/hr over 60 Minutes Intravenous Every M-W-F (Hemodialysis) 03/18/20 0938 03/18/20 1700   03/18/20 1200  cefTAZidime (FORTAZ) 2 g in sodium chloride 0.9 % 100 mL IVPB  Status:  Discontinued        2 g 200 mL/hr over 30 Minutes Intravenous Every Mon (Hemodialysis) 03/18/20 0939 03/18/20 1015   03/07/20 1800  cefTAZidime (FORTAZ) 2 g in sodium chloride 0.9 % 100 mL IVPB  Status:  Discontinued        2 g 200 mL/hr over 30 Minutes Intravenous Every T-Th-Sa (1800) 03/06/20 1255 03/18/20 1015   03/07/20 1200  vancomycin (VANCOCIN) IVPB 500 mg/100 ml premix  Status:  Discontinued        500 mg 100 mL/hr over 60 Minutes Intravenous Every T-Th-Sa (Hemodialysis) 03/06/20 1255 03/19/20 0941   03/04/20 2000  cefTAZidime (FORTAZ) 2 g in sodium chloride 0.9 % 100 mL IVPB  Status:  Discontinued        2 g 200 mL/hr over 30 Minutes Intravenous Every M-W-F (2000) 03/01/20 1136 03/06/20 1255   03/02/20 1200  vancomycin (VANCOCIN) IVPB 500 mg/100 ml premix        500 mg 100 mL/hr over 60 Minutes Intravenous Every T-Th-Sa (Hemodialysis) 03/01/20 1131 03/02/20 1710   03/01/20 1230  vancomycin (VANCOCIN) IVPB 500 mg/100 ml premix  Status:  Discontinued        500 mg 100 mL/hr over 60 Minutes Intravenous Every M-W-F (Hemodialysis) 03/01/20 1131 03/06/20 1255   02/28/20 1110  vancomycin (VANCOREADY) IVPB 500 mg/100 mL         over 60 Minutes  Continuous PRN 02/28/20 1112 02/28/20 1110   02/28/20 1101  vancomycin (VANCOCIN) 1-5 GM/200ML-% IVPB  Status:  Discontinued       Note to Pharmacy: Lytle Butte   : cabinet override      02/28/20 1101 02/28/20 1327   02/27/20 1800  cefTAZidime (FORTAZ) 1 g in sodium chloride 0.9 % 100 mL IVPB        1 g 200 mL/hr over 30 Minutes Intravenous Every 24 hours 02/27/20 0947 03/03/20 1738   02/27/20 1200  vancomycin (VANCOCIN) IVPB 500 mg/100 ml premix        500 mg 100 mL/hr over 60 Minutes Intravenous Once 02/27/20 0829 02/27/20 1343   02/24/20 1800  vancomycin (VANCOREADY) IVPB 500 mg/100 mL        500 mg 100 mL/hr over 60 Minutes Intravenous  Once 02/24/20 1308 02/24/20 1905   02/22/20 2200  metroNIDAZOLE (FLAGYL) tablet 500 mg  Status:  Discontinued        500 mg Per Tube Every 8 hours 02/22/20 0957 03/30/20 1057   02/22/20 1700  cefTRIAXone (ROCEPHIN) 2 g in sodium chloride 0.9 % 100 mL IVPB  Status:  Discontinued        2 g 200 mL/hr over 30 Minutes Intravenous Every 24 hours 02/22/20 0957 02/27/20 0947   02/22/20 1200  vancomycin (VANCOCIN) IVPB 500 mg/100 ml premix  Status:  Discontinued        500 mg 100 mL/hr over 60 Minutes Intravenous Every T-Th-Sa (Hemodialysis) 02/20/20 1253 02/21/20 1040   02/21/20 1431  vancomycin variable dose per unstable renal function (pharmacist dosing)  Status:  Discontinued         Does not apply See admin instructions 02/21/20 1431 03/01/20 1131   02/21/20 1200  vancomycin (VANCOREADY) IVPB 500 mg/100 mL        500 mg 100 mL/hr over 60 Minutes Intravenous  Once 02/21/20 0956 02/21/20 1253   02/21/20 0714  vancomycin (VANCOCIN) 500-5 MG/100ML-% IVPB  Status:  Discontinued       Note to Pharmacy: Ashley Akin   : cabinet override      02/21/20 0714 02/21/20 1346   02/20/20 1400  vancomycin (VANCOREADY) IVPB 1250 mg/250 mL        1,250 mg 166.7 mL/hr over 90 Minutes Intravenous  Once 02/20/20 1253 02/20/20 1538   02/20/20 1200   levofloxacin (LEVAQUIN) IVPB 500 mg  Status:  Discontinued        500 mg 100 mL/hr over 60 Minutes Intravenous Every 48 hours 02/20/20 1020 02/21/20 0943   02/15/20 1545  vancomycin (VANCOCIN) IVPB 1000 mg/200 mL premix  Status:  Discontinued       "Followed by" Linked Group Details   1,000 mg 200 mL/hr over 60 Minutes Intravenous Every 24 hours 02/14/20 1530 02/14/20 1745   02/15/20 0330  ceFEPIme (MAXIPIME) 2 g in sodium chloride 0.9 % 100 mL IVPB  Status:  Discontinued        2 g 200 mL/hr over 30 Minutes Intravenous Every 12 hours 02/14/20 1531 02/14/20 1531   02/14/20 1531  ceFEPIme (MAXIPIME) 2 g in sodium chloride 0.9 % 100 mL IVPB  Status:  Discontinued        2 g 200 mL/hr over 30 Minutes Intravenous Every 12 hours 02/14/20 1531 02/14/20 1745   02/14/20 1530  ceFEPIme (MAXIPIME) 2 g in sodium chloride 0.9 % 100 mL IVPB  Status:  Discontinued        2 g 200 mL/hr over 30 Minutes Intravenous  Once 02/14/20 1520 02/14/20 1531   02/14/20 1530  vancomycin (VANCOREADY) IVPB 1500 mg/300 mL  Status:  Discontinued       "Followed by" Linked Group Details   1,500 mg 150 mL/hr over 120 Minutes Intravenous  Once 02/14/20 1530 02/14/20 1745          Gracee Ratterree MD [facility]  If 7PM-7AM, please contact night-coverage at [email or web address] 04/14/2020, 1:49 PM  LOS: 60 days

## 2020-04-14 NOTE — Progress Notes (Signed)
Patient ID: Shane Banks, male   DOB: 1946-01-15, 74 y.o.   MRN: [id number]  [facility] Progress Note   Assessment/ Plan:   1.  Acute on chronic hypoxic respiratory failure: Status post tracheostomy with history of recurrent HCAP. 2. ESRD: We will continue with hemodialysis a Monday/Wednesday/Friday schedule with his next dialysis treatment due tomorrow.  Given his current deconditioning and nutritional needs as well as ongoing tracheostomy care requirements; he is physically suitable for outpatient hemodialysis placement. 3. Anemia: Likely secondary to underlying end-stage renal disease and compounded by acute illness/inflammatory component.  Continue ESA. 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  Continue to follow off of vitamin D receptor analogs. 5. Nutrition: Remains n.p.o. and getting tube feeds due to concerns of recurrent aspiration. 6. Hypertension: Blood pressures within acceptable range, will continue efforts at volume unloading to match input and help alleviate edema.  Subjective:   No acute events noted from overnight.  Appears to be intermittently coughing.   Objective:   BP 136/74 (BP Location: Left Arm)   Pulse 85   Temp 98 F (36.7 C) (Oral)   Resp (!) 24   Ht 5' 9"  (1.753 m)   Wt 66.5 kg   SpO2 100%   BMI 21.65 kg/m   Physical Exam: Gen: Chronically ill-appearing and cachectic man with tracheostomy. CVS: Pulse regular rhythm, normal rate, S1 and S2 normal Resp: Coarse breath sounds bilaterally, no distinct rales or rhonchi.  Right IJ TDC Abd: Soft, colostomy bag in situ.  Feeding tube in situ Ext: Trace upper extremity and lower extremity edema bilaterally with some dependent edema.  Labs: BMET Recent Labs  Lab 04/08/20 1031 04/10/20 1010 04/11/20 0730 04/12/20 0543  NA 134* 131* 128* 130*  K 4.0 3.9 3.7 3.4*  CL 99 96* 94* 98  CO2 27 26 25 24   GLUCOSE 91  131* 117* 106*  BUN 92* 91* 116* 57*  CREATININE 3.20* 2.73* 2.99* 1.87*  CALCIUM 9.2 9.3 9.3 9.2  PHOS 4.4 4.4 4.6 2.7   CBC Recent Labs  Lab 04/08/20 1031 04/10/20 1010 04/11/20 0730 04/12/20 0543  WBC 12.3* 13.7* 13.7* 18.3*  HGB 8.5* 9.1* 8.4* 9.4*  HCT 28.1* 30.0* 28.0* 30.4*  MCV 80.3 79.6* 80.5 79.8*  PLT 261 265 280 302      Medications:    . apixaban  2.5 mg Per Tube BID  . chlorhexidine  15 mL Mouth Rinse BID  . chlorhexidine gluconate (MEDLINE KIT)  15 mL Mouth Rinse BID  . Chlorhexidine Gluconate Cloth  6 each Topical Q0600  . cholestyramine  4 g Per Tube BID  . darbepoetin (ARANESP) injection - DIALYSIS  100 mcg Intravenous Q Mon-HD  . feeding supplement (PROSource TF)  45 mL Per Tube TID  . fluconazole  100 mg Per Tube Daily  . insulin aspart  0-6 Units Subcutaneous Q4H  . insulin aspart  2 Units Subcutaneous Q4H  . levothyroxine  25 mcg Per Tube Q0600  . lipase/protease/amylase)  20,880 Units Per Tube Once   And  . sodium bicarbonate  650 mg Per Tube Once  . mouth rinse  15 mL Mouth Rinse q12n4p  . metoprolol tartrate  25 mg Per Tube BID  . nutrition supplement (JUVEN)  1 packet Per Tube BID BM  . pantoprazole sodium  40 mg Per Tube Daily  . phenylephrine 200 mcg / ml (NON-ED USE ONLY) injection  100 mcg Intracavernosal  Once   Elmarie Shiley, MD 04/14/2020, 8:04 AM

## 2020-04-15 LAB — CBC
HCT: 28.5 % — ABNORMAL LOW (ref 39.0–52.0)
Hemoglobin: 8.9 g/dL — ABNORMAL LOW (ref 13.0–17.0)
MCH: 23.7 pg — ABNORMAL LOW (ref 26.0–34.0)
MCHC: 31.2 g/dL (ref 30.0–36.0)
MCV: 76 fL — ABNORMAL LOW (ref 80.0–100.0)
Platelets: 283 10*3/uL (ref 150–400)
RBC: 3.75 MIL/uL — ABNORMAL LOW (ref 4.22–5.81)
RDW: 17.2 % — ABNORMAL HIGH (ref 11.5–15.5)
WBC: 13.2 10*3/uL — ABNORMAL HIGH (ref 4.0–10.5)
nRBC: 0 % (ref 0.0–0.2)

## 2020-04-15 LAB — RENAL FUNCTION PANEL
Albumin: 1.8 g/dL — ABNORMAL LOW (ref 3.5–5.0)
Anion gap: 9 (ref 5–15)
BUN: 107 mg/dL — ABNORMAL HIGH (ref 8–23)
CO2: 24 mmol/L (ref 22–32)
Calcium: 9.7 mg/dL (ref 8.9–10.3)
Chloride: 92 mmol/L — ABNORMAL LOW (ref 98–111)
Creatinine, Ser: 2.54 mg/dL — ABNORMAL HIGH (ref 0.61–1.24)
GFR calc Af Amer: 28 mL/min — ABNORMAL LOW (ref >60)
GFR calc non Af Amer: 24 mL/min — ABNORMAL LOW (ref >60)
Glucose, Bld: 117 mg/dL — ABNORMAL HIGH (ref 70–99)
Phosphorus: 3.6 mg/dL (ref 2.5–4.6)
Potassium: 3.7 mmol/L (ref 3.5–5.1)
Sodium: 125 mmol/L — ABNORMAL LOW (ref 135–145)

## 2020-04-15 LAB — GLUCOSE, CAPILLARY
Glucose-Capillary: 120 mg/dL — ABNORMAL HIGH (ref 70–99)
Glucose-Capillary: 125 mg/dL — ABNORMAL HIGH (ref 70–99)
Glucose-Capillary: 127 mg/dL — ABNORMAL HIGH (ref 70–99)
Glucose-Capillary: 136 mg/dL — ABNORMAL HIGH (ref 70–99)
Glucose-Capillary: 137 mg/dL — ABNORMAL HIGH (ref 70–99)
Glucose-Capillary: 142 mg/dL — ABNORMAL HIGH (ref 70–99)
Glucose-Capillary: 148 mg/dL — ABNORMAL HIGH (ref 70–99)

## 2020-04-15 MED ORDER — HEPARIN SODIUM (PORCINE) 1000 UNIT/ML IJ SOLN
INTRAMUSCULAR | Status: AC
  Filled 2020-04-15: qty 4

## 2020-04-15 MED ORDER — DARBEPOETIN ALFA 100 MCG/0.5ML IJ SOSY
PREFILLED_SYRINGE | INTRAMUSCULAR | Status: AC
  Filled 2020-04-15: qty 0.5

## 2020-04-15 NOTE — Plan of Care (Signed)

## 2020-04-15 NOTE — Progress Notes (Signed)
Patient ID: Shane Banks, male   DOB: [birth year], 74 y.o.   MRN: [id number]  [facility] Progress Note   Assessment/ Plan:   1.  Acute on chronic hypoxic respiratory failure: Status post tracheostomy with history of recurrent HCAP.  2. ESRD: We will continue with hemodialysis a Monday/Wednesday/Friday schedule.  Given his current deconditioning and nutritional needs as well as ongoing tracheostomy care requirements; he is physically not  suitable for outpatient hemodialysis placement.  3. Anemia: Likely secondary to underlying end-stage renal disease and compounded by acute illness/inflammatory component.  Continue ESA.  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  Continue to follow off of vitamin D receptor analogs  5. Nutrition: tube feeds due to concerns of recurrent aspiration.  6. Hypertension: Blood pressures within acceptable range, optimize volume with HD   Subjective:   Last HD on 7/16 with 2.5 kg UF.  He is not able to offer additional history  Review of systems: unable to obtain 2/2 AMS/patient factors    Objective:   BP 132/67 (BP Location: Right Arm)   Pulse 75   Temp 98.7 F (37.1 C) (Oral)   Resp 20   Ht _0  (1.753 m)   Wt 66.5 kg   SpO2 100%   BMI 21.65 kg/m   Physical Exam:   Gen: Chronically ill-appearing and cachectic man with tracheostomy. CVS: S1 and S2 no rub Resp: Coarse breath sounds bilaterally, no distinct rales or rhonchi.   Abd: Soft, colostomy bag in situ.  Feeding tube in situ Ext: Trace upper extremity and lower extremity edema bilaterally; heel protection in place Neuro - awake/eyes open on arrival; does not follow simple motor command - ie show me your thumb Access: RIJ tunneled catheter  Labs: BMET Recent Labs  Lab 04/08/20 1031 04/10/20 1010 04/11/20 0730 04/12/20 0543  NA 134* 131* 128* 130*  K 4.0 3.9 3.7 3.4*  CL 99 96* 94* 98  CO2  _1 GLUCOSE 91 131* 117* 106*  BUN 92* 91* 116* 57*  CREATININE 3.20* 2.73* 2.99* 1.87*  CALCIUM 9.2 9.3 9.3 9.2  PHOS 4.4 4.4 4.6 2.7   CBC Recent Labs  Lab 04/08/20 1031 04/10/20 1010 04/11/20 0730 04/12/20 0543  WBC 12.3* 13.7* 13.7* 18.3*  HGB 8.5* 9.1* 8.4* 9.4*  HCT 28.1* 30.0* 28.0* 30.4*  MCV 80.3 79.6* 80.5 79.8*  PLT 261 265 280 302      Medications:    . apixaban  2.5 mg Per Tube BID  . chlorhexidine  15 mL Mouth Rinse BID  . chlorhexidine gluconate (MEDLINE KIT)  15 mL Mouth Rinse BID  . Chlorhexidine Gluconate Cloth  6 each Topical Q0600  . cholestyramine  4 g Per Tube BID  . darbepoetin (ARANESP) injection - DIALYSIS  100 mcg Intravenous Q Mon-HD  . feeding supplement (PROSource TF)  45 mL Per Tube TID  . fluconazole  100 mg Per Tube Daily  . insulin aspart  0-6 Units Subcutaneous Q4H  . insulin aspart  2 Units Subcutaneous Q4H  . levothyroxine  25 mcg Per Tube Q0600  . lipase/protease/amylase)  20,880 Units Per Tube Once   And  . sodium bicarbonate  650 mg Per Tube Once  . mouth rinse  15 mL Mouth Rinse q12n4p  . metoprolol tartrate  25 mg Per Tube BID  . nutrition supplement (JUVEN)  1 packet Per Tube BID BM  . pantoprazole sodium  40 mg Per Tube Daily  . phenylephrine 200 mcg / ml (NON-ED USE ONLY) injection  100 mcg Intracavernosal Once   Claudia Desanctis, MD 04/15/2020, 7:15 AM

## 2020-04-15 NOTE — Progress Notes (Signed)
Nutrition Follow-up  DOCUMENTATION CODES:   Underweight, Severe malnutrition in context of chronic illness  INTERVENTION:   Obtain recent weight  Continue tube feeding via PEG: - Nepro @ 50 ml/hr (1200 ml/day) - ProSource 45 ml TID -Juven BID  Tube feeding regimen provides 2280 kcal, 130 grams of protein, and 872 ml of H2O.   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  Ongoing  GOAL:   Patient will meet greater than or equal to 90% of their needs  Addressed via TF  MONITOR:   Diet advancement, Labs, Weight trends, TF tolerance, Skin, I & O's  REASON FOR ASSESSMENT:   Ventilator, Other (hx PEG)    ASSESSMENT:   74 yo male admitted with hemoptysis. 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  6/02 - s/p new [facility] 6/07 - trach collar 6/18 - returned to ICU for increased secretions requiring vent support 6/24 - back on trach collar 6/25 - transferred out of ICU  Mental status remains altered. Able to close eyes upon command. Tolerating tube feeding at goal. Okay output via colostomy. Need new weight. Plan goals of care meeting on 7/21.   EDW: 62.8 kg Current weight: 66.5 kg (taken 7/15)  Medications: questran, aranesp, SS novolog Labs: CBG [id number]  Diet Order:   Diet Order            Diet NPO time specified  Diet effective now                 EDUCATION NEEDS:   No education needs have been identified at this time  Skin:  Skin Assessment: Skin Integrity Issues: Stage IV: sacrum Stage III: buttocks Stage II: buttocks   Last BM:  7/18-via colostomy  Height:   Ht Readings from Last 1 Encounters:  03/18/20 5\' 9"  (1.753 m)    Weight:   Wt Readings from Last 1 Encounters:  04/11/20 66.5 kg    Ideal Body Weight:  72.7 kg  BMI:   Body mass index is 21.65 kg/m.  Estimated Nutritional Needs:   Kcal:  2200-2400  Protein:  110-135 grams  Fluid:  1 L + UOP   Shane Banks RD, LDN Clinical Nutrition Pager listed in [place]

## 2020-04-15 NOTE — Progress Notes (Signed)
   04/15/20 0832  Assess: MEWS Score  Temp 98.9 F (37.2 C)  BP 138/74  Pulse Rate 83  ECG Heart Rate 86  Resp (!) 37  Level of Consciousness Alert  SpO2 100 %  O2 Device Tracheostomy Collar  O2 Flow Rate (L/min) 5 L/min  FiO2 (%) 28 %  Assess: MEWS Score  MEWS Temp 0  MEWS Systolic 0  MEWS Pulse 0  MEWS RR 3  MEWS LOC 0  MEWS Score 3  MEWS Score Color Yellow  Assess: if the MEWS score is Yellow or Red  Were vital signs taken at a resting state? No  Focused Assessment No change from prior assessment  Early Detection of Sepsis Score *See Row Information* Low  MEWS guidelines implemented *See Row Information* No, vital signs rechecked  Document  Patient Outcome Stabilized after interventions;Other (Comment) (pt getting breathing treatment)  Progress note created (see row info) Yes   Pt. Getting breathing treatment from respiratory therapist.

## 2020-04-15 NOTE — Progress Notes (Signed)
[facility] PROGRESS NOTE  Sutton Hirsch ERX:[id number] DOB: 09-29-1945 DOA: 02/14/2020 PCP: [name], MD  Status: Inpatient--Remains inpatient appropriate because :Unsafe d/c plan, IV treatments appropriate due to intensity of illness or inability to take PO and Inpatient level of care appropriate due to severity of illness-patient continues to have tenuous respiratory status   Dispo:  Patient From:  Home  Planned Disposition:    Expected discharge date:    Medically stable for discharge: No-needs to transition to cuffless trach (respiratory status too tenuous to accomplish), needs to be able to sit upright in recliner to pursue dialysis in the outpatient setting (profoundly physically deconditioned and unable to even lift arms at this juncture), -remains on IV antibiotics secondary to sacral osteomyelitis and decubitus and recent bacteremia with last dose due on 7/8  **7/15: According to patient's sister [name].  Patient's adult children are unable to make it to the hospital until Wednesday July 21st.  They will visit patient and at that time family will reconvene to make decisions regarding next steps and progressing towards comfort measures as recommended.  Code Status: Full Family Communication: [name] sister 7/14 and 04/11/20, brother [name] on 04/02/20 DVT prophylaxis: Eliquis  HPI: 74 y.o. 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 Patient treated for Covid pneumonia January 2021 and had been on the vent because of progressive ARDS.  Because of lack of progression he underwent tracheostomy and was [facility].  Was sent back to this facility  in May due to trach site bleeding suspected hemoptysis.  He was also noted with sacral decubitus at time of presentation in May.  Since admission he has been treated for recurrent multidrug-resistant Pseudomonas pneumonia and MRSA and enterococcal bacteremia.  On 03/15/2020 patient returned to the ICU for ventilatory and pressor support he developed septic shock and developed acute renal failure requiring dialysis.  He developed sepsis physiology likely secondary to sacral decubitus ulcer with underlying osteomyelitis.    He has been weaned from pressors. PCCM is following for trach weaning with the assistance of SLP.  Patient was liberated from the vent on 03/21/2020 and was transferred to the floor in 03/22/2020.  Due to the lack of insurance approval he cannot go back to [facility].  Social worker is working on Clinical research associate.  He will need to be transition to a cuffless trach which unfortunately has not been accomplished due to recurrent [name] with respiratory failure physiology.  He has been deemed to not be a candidate for outpatient hemodialysis by the nephrologist.  Patient has not made any progress in mobility OT and PT have signed off.  On the morning of 7/6 patient developed hypoxemia and increased work of breathing with shallow respiratory effort, requiring frequent suctioning.  O2 sats decreased into the 70s but improved to 95% with bagging and suctioning.  Briefly required increase of FiO2 to 100% but was eventually decreased back to 40%.  Tracheal aspirate appeared consistent with tube feedings therefore tube feedings were stopped.  He was found to have multidrug-resistant Pseudomonas once again only sensitive to gent this time.  Concerns that this may be related to colonization with recommendation from ID to follow.  Patient later developed fevers within 24 hours after that recommendation on the evening  of 7/9 so at the recommendation of infectious disease service given  multidrug-resistant Pseudomonas recommendation was started on Fortaz plus [name].  Patient reevaluated by SLP on 7/14.  PMV was placed - his exhalatory effort was extremely diminished to the point he had difficulty moving air to the upper airway.  Able to demonstrate increased vocal intensity without definitive phonation.  The expiratory muscle strength trainer was used at the lowest setting of 5 cm of water with little effort but accurate.  Is able to complete 8 repetitions but reported dizziness.  Subjective: Alert -for the most part nodding appropriately to questions asked but this remains inconsistent  Objective: Vitals:   04/15/20 1030 04/15/20 1200  BP: (!) 142/82 (!) 156/88  Pulse: 83 83  Resp: (!) 24 20  Temp:  98.9 F (37.2 C)  SpO2: 99% 99%    Intake/Output Summary (Last 24 hours) at 04/15/2020 1236 Last data filed at 04/15/2020 1200 Gross per 24 hour  Intake 2999.98 ml  Output 950 ml  Net 2049.98 ml   Filed Weights   04/11/20 0500 04/11/20 0721 04/11/20 1056  Weight: 69 kg 68 kg 66.5 kg    Exam:  Constitutional: NAD, calm and alert today, appears to be comfortable   Neck: midline cuffed trach in place  Respiratory: Bilateral lung sounds coarse on auscultation anteriorly.  Stable, chronic tachypneic but without significant increased work of breathing, trach collar oxygen/FiO2 28% Cardiovascular: Irregular rate with underlying rhythm atrial flutter rate controlled in the 60s to 80s, no murmurs / rubs / gallops.  Trace upper extremity edema improving.. 2+ pedal pulses. Dialysis catheter right subclavian vein. Abdomen: no tenderness, no masses palpated. No hepatosplenomegaly. Bowel sounds positive.  Colostomy in place with light brown unformed stool.  Gastrostomy tube with tube feeding GU: Condom catheter in place draining yellow-colored urine to bedside bag Musculoskeletal: no clubbing / cyanosis. No joint deformity upper and lower extremities.  Not spontaneously moving  UEs but with PROM has adequate range of motion. Extension contracture involving the right wrist and right elbow. Diminished muscle tone throughout all extremities and too weak to follow commands with upper extremities/unable to lift extremities upon command  Skin: no rashes, lesions, sacral decubitus not examined Neurologic: CN 2-12 grossly intact. 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  PCCM following periodically to assess readiness for transition cuffless trach. 7/6: Significant respiratory decompensation with FiO2 increased up to 60%. Aspiration and recurrent Pseudomonas pneumonia as etiology. Of note, family refusing further conversations with palliative medicine team.  Excellent telephonic meeting with ethics physician Dr. Andria Frames on 7/14.  Please see his note for details.  Sister made aware of no meaningful overall recovery including from a pulmonary standpoint. Has completed 8 doses of IV meropenem as of 7/9.   Repeat sputum culture obtained prior to initiation of meropenem has finalized with few MDR Pseudomonas (resistant to imipenem/Carbapenem's but sensitive to gentamicin). Patient developed low-grade fever on 7/9-discussed with ID who recommended Fortaz with [name]-as of 7/19 patient has received 10 days of medication-likely would benefit from a total of 14 days given recurrence of MDR organism Unfortunately given recurrent pneumonia and increased secretions with associated generalized weakness/weak cough it is highly  doubtful he can transition to cuffless  trach at this point.  Overall prognosis is poor.  Continue chest physiotherapy with vibra vest administered per RT  Goals of care: Ethics team meeting on 7/14 as described above. I will be recontacting patient's Sister [name] today prior to 4 PM to determine if family has reached a decision regarding transitioning patient to comfort care. Please see detailed notes from Dr. Andria Frames dated 7/14 Patient's children will be able to visit with him on Wednesday 7/21.  It is understood that after this visit family will reconvene and make a decision regarding transitioning to comfort care  Diarrhea Likely noninfectious in etiology-C. difficile negative Suspect related to antibiotics and other medications bolus tube feeding Continues to have watery stools per colostomy 7/14 cholestyramine twice daily initiated Continue maintenance fluids at 30 cc/h  Mild funguria/oral yeast Urine culture with 30,000 colonies of yeast Continue Diflucan-had started last week but fell off medication list and was resumed on 7/8-has completed 10 days so we will discontinue on 7/19  Sacral osteomyelitis and HD catheter sepsis/  Enterococcal bacteremia: Tunneled catheter removal on 02/21/2020. Has tunneled HD catheter right subclavian-side unremarkable Completed 6 weeks of IV vancomycin and Flagyl 7/8  Paroxysmal atrial fibrillation and flutter/Acquired thrombophilia: Currently rate controlled on metoprolol Anticoagulation previously discontinued due to suspected GI bleed and was resumed 5/31 and patient has tolerated it any signs of bleeding.  Chronic kidney disease stage V now requiring hemodialysis 2/2 ATN and AKI from IV gentamicin: On HD since March 2021 status post tunneled catheter placement 02/28/2020 and as of 7/1 site unremarkable. Dialysis days: MWF SNF reported patient will need to be able to sit upright in recliner to receive hemodialysis before they will accept him as a patient-patient has been unable to  meet this goal and is not making any progress in regards to physical abilities-OT/PT has signed off Unfortunately, this patient continues to decline, he has significant weakness and is unable to tolerate sitting up in a recliner receive outpatient hemodialysis.  Attending nephrologist has documented that patient is not a candidate for outpatient hemodialysis  Chronic normocytic anemia: Due to anemia of chronic renal disease Status post 1 unit of PRBCs Hgb on 7/19 was 8.9 Continue erythropoietin and IV iron per renal.  History of nonhemorrhagic CVA: CT of the head showed atrophy and chronic microvascular disease. 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 Documented as full-thickness tissue loss  Severe protein caloric malnutrition: Tube feedings on hold due to concerns of a possible aspiration as above.  Patient is dialysis patient but will allow D5 normal saline at 30 cc/h to replace tube feeding volume loss Will allow medications per tube with small amounts of flush Nutrition Status: Nutrition Problem: Severe Malnutrition Etiology: chronic illness (chronic respiratory failure related to COVID ARDS) Signs/Symptoms: severe muscle depletion, severe fat depletion Interventions: Prostat, Tube feeding, Juven-continue tube feeding and check residuals every 4 hours      Data Reviewed: Basic Metabolic Panel: Recent Labs  Lab 04/10/20 1010 04/11/20 0730 04/12/20 0543  NA 131* 128* 130*  K 3.9 3.7 3.4*  CL 96* 94* 98  CO2 _0 GLUCOSE 131* 117* 106*  BUN 91* 116* 57*  CREATININE 2.73* 2.99* 1.87*  CALCIUM 9.3 9.3 9.2  PHOS 4.4 4.6 2.7   Liver Function Tests: Recent Labs  Lab 04/10/20 1010 04/11/20 0730 04/12/20 0543  ALBUMIN 1.9* 1.8* 2.0*   No results for input(s): LIPASE,  AMYLASE in the last 168 hours. No results for input(s): AMMONIA in the last 168 hours. CBC: Recent Labs  Lab  04/10/20 1010 04/11/20 0730 04/12/20 0543 04/15/20 1026  WBC 13.7* 13.7* 18.3* 13.2*  HGB 9.1* 8.4* 9.4* 8.9*  HCT 30.0* 28.0* 30.4* 28.5*  MCV 79.6* 80.5 79.8* 76.0*  PLT 265 280 302 283   Cardiac Enzymes: No results for input(s): CKTOTAL, CKMB, CKMBINDEX, TROPONINI in the last 168 hours. BNP (last 3 results) No results for input(s): BNP in the last 8760 hours.  ProBNP (last 3 results) No results for input(s): PROBNP in the last 8760 hours.  CBG: Recent Labs  Lab 04/14/20 2017 04/15/20 0034 04/15/20 0419 04/15/20 0827 04/15/20 1209  GLUCAP 122* 125* 142* 127* 120*    No results found for this or any previous visit (from the past 240 hour(s)).   Studies: No results found.  Scheduled Meds: . apixaban  2.5 mg Per Tube BID  . chlorhexidine  15 mL Mouth Rinse BID  . chlorhexidine gluconate (MEDLINE KIT)  15 mL Mouth Rinse BID  . Chlorhexidine Gluconate Cloth  6 each Topical Q0600  . cholestyramine  4 g Per Tube BID  . darbepoetin (ARANESP) injection - DIALYSIS  100 mcg Intravenous Q Mon-HD  . feeding supplement (PROSource TF)  45 mL Per Tube TID  . fluconazole  100 mg Per Tube Daily  . insulin aspart  0-6 Units Subcutaneous Q4H  . insulin aspart  2 Units Subcutaneous Q4H  . levothyroxine  25 mcg Per Tube Q0600  . lipase/protease/amylase)  20,880 Units Per Tube Once   And  . sodium bicarbonate  650 mg Per Tube Once  . mouth rinse  15 mL Mouth Rinse q12n4p  . metoprolol tartrate  25 mg Per Tube BID  . nutrition supplement (JUVEN)  1 packet Per Tube BID BM  . pantoprazole sodium  40 mg Per Tube Daily  . phenylephrine 200 mcg / ml (NON-ED USE ONLY) injection  100 mcg Intracavernosal Once   Continuous Infusions: . ceftazidime avibactam (AVYCAZ) IVPB 0.94 g (04/15/20 0948)  . dextrose 20 mL/hr at 04/14/20 1020  . feeding supplement (NEPRO CARB STEADY) 1,000 mL (04/15/20 1209)    Principal Problem:   Enterococcal bacteremia Active Problems:   Acute respiratory  failure (HCC)   Hemoptysis   Pressure injury of skin   Protein-calorie malnutrition, severe   Decubitus ulcer of sacral region, stage 4 (HCC)   Fever   Palliative care by specialist   Goals of care, counseling/discussion   DNR (do not resuscitate)     Consultants: PCCM Ethics committee/Dr. Andria Frames  Procedures: Echocardiogram 1. Left ventricular ejection fraction, by estimation, is 60 to 65%. The  left ventricle has normal function. The left ventricle has no regional  wall motion abnormalities. There is severe left ventricular hypertrophy.  Left ventricular diastolic parameters  are consistent with Grade I diastolic dysfunction (impaired relaxation).  2. Right ventricular systolic function is normal. The right ventricular  size is normal. There is mildly elevated pulmonary artery systolic  pressure.  3. Left atrial size was mildly dilated.  4. Right atrial size was mildly dilated.  5. The mitral valve is grossly normal. Trivial mitral valve  regurgitation.  6. The aortic valve is tricuspid. Aortic valve regurgitation is trivial.   Antibiotics: Anti-infectives (From admission, onward)   Start     Dose/Rate Route Frequency Ordered Stop   04/12/20 0900  ceftazidime-avibactam (AVYCAZ) 0.94 g in dextrose 5 % 50 mL IVPB  Discontinue     0.94 g 25 mL/hr over 2 Hours Intravenous Every 24 hours 04/11/20 1225     04/10/20 2100  ceftazidime-avibactam (AVYCAZ) 0.94 g in dextrose 5 % 50 mL IVPB  Status:  Discontinued        0.94 g 25 mL/hr over 2 Hours Intravenous Every 12 hours 04/10/20 2005 04/11/20 1225   04/08/20 1420  ceftazidime-avibactam (AVYCAZ) 0.94 g in dextrose 5 % 50 mL IVPB  Status:  Discontinued        0.94 g 25 mL/hr over 2 Hours Intravenous Daily at bedtime 04/08/20 1420 04/10/20 2005   04/07/20 1800  ceftazidime-avibactam (AVYCAZ) 0.94 g in dextrose 5 % 50 mL IVPB  Status:  Discontinued        0.94 g 25 mL/hr over 2 Hours Intravenous Every 12 hours 04/07/20  1322 04/08/20 1420   04/05/20 1000  fluconazole (DIFLUCAN) 40 MG/ML suspension 100 mg     Discontinue    "Followed by" Linked Group Details   100 mg Per Tube Daily 04/04/20 2203     04/04/20 2300  fluconazole (DIFLUCAN) 40 MG/ML suspension 200 mg       "Followed by" Linked Group Details   200 mg Per Tube  Once 04/04/20 2203 04/04/20 2325   04/01/20 1013  vancomycin (VANCOCIN) 500-5 MG/100ML-% IVPB       Note to Pharmacy: Cherylann Banas   : cabinet override      04/01/20 1013 04/01/20 1253   03/29/20 1552  vancomycin (VANCOCIN) 500-5 MG/100ML-% IVPB       Note to Pharmacy: Anthonette Legato, Melisa   : cabinet override      03/29/20 1552 03/29/20 1556   03/29/20 1415  fluconazole (DIFLUCAN) 40 MG/ML suspension 100 mg        100 mg Oral Daily 03/29/20 1408 03/31/20 1015   03/28/20 2000  meropenem (MERREM) 500 mg in sodium chloride 0.9 % 100 mL IVPB        500 mg 200 mL/hr over 30 Minutes Intravenous Every 24 hours 03/28/20 1623 04/04/20 2201   03/25/20 1800  cefTAZidime (FORTAZ) 1 g in sodium chloride 0.9 % 100 mL IVPB  Status:  Discontinued       "Followed by" Linked Group Details   1 g 200 mL/hr over 30 Minutes Intravenous Every 24 hours 03/18/20 1015 03/28/20 1613   03/22/20 0830  vancomycin (VANCOREADY) IVPB 500 mg/100 mL        500 mg 100 mL/hr over 60 Minutes Intravenous  Once 03/22/20 0827 03/22/20 1014   03/20/20 1200  vancomycin (VANCOCIN) IVPB 500 mg/100 ml premix        500 mg 100 mL/hr over 60 Minutes Intravenous Every M-W-F (Hemodialysis) 03/19/20 0941 04/03/20 1500   03/18/20 1800  meropenem (MERREM) 500 mg in sodium chloride 0.9 % 100 mL IVPB       "Followed by" Linked Group Details   500 mg 200 mL/hr over 30 Minutes Intravenous Every 24 hours 03/18/20 1015 03/25/20 0026   03/18/20 1200  vancomycin (VANCOCIN) IVPB 500 mg/100 ml premix        500 mg 100 mL/hr over 60 Minutes Intravenous Every M-W-F (Hemodialysis) 03/18/20 0938 03/18/20 1700   03/18/20 1200  cefTAZidime (FORTAZ) 2  g in sodium chloride 0.9 % 100 mL IVPB  Status:  Discontinued        2 g 200 mL/hr over 30 Minutes Intravenous Every Mon (Hemodialysis) 03/18/20 0939 03/18/20 1015   03/07/20 1800  cefTAZidime (FORTAZ) 2  g in sodium chloride 0.9 % 100 mL IVPB  Status:  Discontinued        2 g 200 mL/hr over 30 Minutes Intravenous Every T-Th-Sa (1800) 03/06/20 1255 03/18/20 1015   03/07/20 1200  vancomycin (VANCOCIN) IVPB 500 mg/100 ml premix  Status:  Discontinued        500 mg 100 mL/hr over 60 Minutes Intravenous Every T-Th-Sa (Hemodialysis) 03/06/20 1255 03/19/20 0941   03/04/20 2000  cefTAZidime (FORTAZ) 2 g in sodium chloride 0.9 % 100 mL IVPB  Status:  Discontinued        2 g 200 mL/hr over 30 Minutes Intravenous Every M-W-F (2000) 03/01/20 1136 03/06/20 1255   03/02/20 1200  vancomycin (VANCOCIN) IVPB 500 mg/100 ml premix        500 mg 100 mL/hr over 60 Minutes Intravenous Every T-Th-Sa (Hemodialysis) 03/01/20 1131 03/02/20 1710   03/01/20 1230  vancomycin (VANCOCIN) IVPB 500 mg/100 ml premix  Status:  Discontinued        500 mg 100 mL/hr over 60 Minutes Intravenous Every M-W-F (Hemodialysis) 03/01/20 1131 03/06/20 1255   02/28/20 1110  vancomycin (VANCOREADY) IVPB 500 mg/100 mL        over 60 Minutes  Continuous PRN 02/28/20 1112 02/28/20 1110   02/28/20 1101  vancomycin (VANCOCIN) 1-5 GM/200ML-% IVPB  Status:  Discontinued       Note to Pharmacy: Lytle Butte   : cabinet override      02/28/20 1101 02/28/20 1327   02/27/20 1800  cefTAZidime (FORTAZ) 1 g in sodium chloride 0.9 % 100 mL IVPB        1 g 200 mL/hr over 30 Minutes Intravenous Every 24 hours 02/27/20 0947 03/03/20 1738   02/27/20 1200  vancomycin (VANCOCIN) IVPB 500 mg/100 ml premix        500 mg 100 mL/hr over 60 Minutes Intravenous Once 02/27/20 0829 02/27/20 1343   02/24/20 1800  vancomycin (VANCOREADY) IVPB 500 mg/100 mL        500 mg 100 mL/hr over 60 Minutes Intravenous  Once 02/24/20 1308 02/24/20 1905   02/22/20 2200   metroNIDAZOLE (FLAGYL) tablet 500 mg  Status:  Discontinued        500 mg Per Tube Every 8 hours 02/22/20 0957 03/30/20 1057   02/22/20 1700  cefTRIAXone (ROCEPHIN) 2 g in sodium chloride 0.9 % 100 mL IVPB  Status:  Discontinued        2 g 200 mL/hr over 30 Minutes Intravenous Every 24 hours 02/22/20 0957 02/27/20 0947   02/22/20 1200  vancomycin (VANCOCIN) IVPB 500 mg/100 ml premix  Status:  Discontinued        500 mg 100 mL/hr over 60 Minutes Intravenous Every T-Th-Sa (Hemodialysis) 02/20/20 1253 02/21/20 1040   02/21/20 1431  vancomycin variable dose per unstable renal function (pharmacist dosing)  Status:  Discontinued         Does not apply See admin instructions 02/21/20 1431 03/01/20 1131   02/21/20 1200  vancomycin (VANCOREADY) IVPB 500 mg/100 mL        500 mg 100 mL/hr over 60 Minutes Intravenous  Once 02/21/20 0956 02/21/20 1253   02/21/20 0714  vancomycin (VANCOCIN) 500-5 MG/100ML-% IVPB  Status:  Discontinued       Note to Pharmacy: Ashley Akin   : cabinet override      02/21/20 0714 02/21/20 1346   02/20/20 1400  vancomycin (VANCOREADY) IVPB 1250 mg/250 mL        1,250 mg 166.7  mL/hr over 90 Minutes Intravenous  Once 02/20/20 1253 02/20/20 1538   02/20/20 1200  levofloxacin (LEVAQUIN) IVPB 500 mg  Status:  Discontinued        500 mg 100 mL/hr over 60 Minutes Intravenous Every 48 hours 02/20/20 1020 02/21/20 0943   02/15/20 1545  vancomycin (VANCOCIN) IVPB 1000 mg/200 mL premix  Status:  Discontinued       "Followed by" Linked Group Details   1,000 mg 200 mL/hr over 60 Minutes Intravenous Every 24 hours 02/14/20 1530 02/14/20 1745   02/15/20 0330  ceFEPIme (MAXIPIME) 2 g in sodium chloride 0.9 % 100 mL IVPB  Status:  Discontinued        2 g 200 mL/hr over 30 Minutes Intravenous Every 12 hours 02/14/20 1531 02/14/20 1531   02/14/20 1531  ceFEPIme (MAXIPIME) 2 g in sodium chloride 0.9 % 100 mL IVPB  Status:  Discontinued        2 g 200 mL/hr over 30 Minutes Intravenous  Every 12 hours 02/14/20 1531 02/14/20 1745   02/14/20 1530  ceFEPIme (MAXIPIME) 2 g in sodium chloride 0.9 % 100 mL IVPB  Status:  Discontinued        2 g 200 mL/hr over 30 Minutes Intravenous  Once 02/14/20 1520 02/14/20 1531   02/14/20 1530  vancomycin (VANCOREADY) IVPB 1500 mg/300 mL  Status:  Discontinued       "Followed by" Linked Group Details   1,500 mg 150 mL/hr over 120 Minutes Intravenous  Once 02/14/20 1530 02/14/20 1745        Time spent: 25 minutes    Erin Hearing ANP [facility] Pager [phone]. If 7PM-7AM, please contact night-coverage at [email or web address] 04/15/2020, 12:36 PM  LOS: 61 days

## 2020-04-16 ENCOUNTER — Inpatient Hospital Stay (HOSPITAL_COMMUNITY): Payer: Medicare HMO

## 2020-04-16 DIAGNOSIS — I361 Nonrheumatic tricuspid (valve) insufficiency: Secondary | ICD-10-CM

## 2020-04-16 LAB — ECHOCARDIOGRAM COMPLETE
Area-P 1/2: 4.15 cm2
Calc EF: 73.2 %
Height: 69 in
S' Lateral: 2.2 cm
Single Plane A2C EF: 77.3 %
Single Plane A4C EF: 70.6 %
Weight: 2345.69 oz

## 2020-04-16 LAB — GLUCOSE, CAPILLARY
Glucose-Capillary: 123 mg/dL — ABNORMAL HIGH (ref 70–99)
Glucose-Capillary: 122 mg/dL — ABNORMAL HIGH (ref 70–99)
Glucose-Capillary: 118 mg/dL — ABNORMAL HIGH (ref 70–99)
Glucose-Capillary: 143 mg/dL — ABNORMAL HIGH (ref 70–99)
Glucose-Capillary: 145 mg/dL — ABNORMAL HIGH (ref 70–99)
Glucose-Capillary: 113 mg/dL — ABNORMAL HIGH (ref 70–99)

## 2020-04-16 MED ORDER — CHLORHEXIDINE GLUCONATE CLOTH 2 % EX PADS
6.0000 | MEDICATED_PAD | Freq: Every day | CUTANEOUS | Status: DC
  Administered 2020-04-20 – 2020-04-26 (×7): 6 via TOPICAL

## 2020-04-16 MED ORDER — DARBEPOETIN ALFA 150 MCG/0.3ML IJ SOSY
150.0000 ug | PREFILLED_SYRINGE | INTRAMUSCULAR | Status: DC
  Administered 2020-04-22: 150 ug via INTRAVENOUS
  Filled 2020-04-16: qty 0.3

## 2020-04-16 MED ORDER — GLYCOPYRROLATE 0.2 MG/ML IJ SOLN
0.1000 mg | Freq: Once | INTRAMUSCULAR | Status: AC
  Administered 2020-04-16: 0.1 mg via INTRAVENOUS
  Filled 2020-04-16: qty 1

## 2020-04-16 NOTE — Progress Notes (Signed)
NAME:  Shane Banks, MRN:  [id number], DOB:  [birth year], LOS: 7 ADMISSION DATE:  02/14/2020, CONSULTATION DATE:  5/19 REFERRING MD:  Dr. Alvino Chapel, CHIEF COMPLAINT:  Hemoptysis   Brief History   74 yo m with PMHx of afib with RVR, BPH, stroke, and COVID ARDS in January requiring trach and LTACH placement at [facility]. Had been tolerating trach collar, but on 5/19 developed hemoptysis and hypoxemic respiratory failure requiring vent. Transferred to [facility] on 5/19.  Past Medical History   has a past medical history of Acute on chronic respiratory failure with hypoxia ([place]), Atrial fibrillation with RVR ([place]), BPH (benign prostatic hyperplasia), COVID-19 virus infection, Pneumonia due to COVID-19 virus, Severe sepsis ([place]), and Stroke ([place]).  Significant Hospital Events   1/8  Admit to [facility] for COVID PNA 2/23 Transfer to [facility] on vent 5/19 Transfer to [facility] for hemoptysis. #6 cuffed trach placed. Back on vent. Bronch performed 5/20 Transitioned to trach collar.  5/22 back on vent full time 5/24 back on antibiotics for fevers 6/1 Initiating PSV weans 6/3 Large cuff leak, trach exchange 6/7 transitioned to [facility] 6/9 remains on TC continuously since 6/7 6/23-tolerating trach collar today 6/25-left of the ventilator last evening and tolerated well; tx to TRH/ floor 04/02/2020 new episode of hypoxia copious secretions tachypnea questionable need to return to ICU pulmonary critical care reconsulted 7/9 low grade temps/ ongoing secretions s/p 8 days meropenum 7/11 changed to [place] per ID recs   Consults:  ENT Nephrology ID PCCM  Procedures:  ENT flex scope 5/19 > no bleeding source identified FOB 5/19 > no pulmonary bleeding source identified.   Significant Diagnostic Tests:  CXR 5/28 >> R CVC placement confirmed Slightly improved aeration at the right base. 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 Edema in the muscles around the hips and in the posterior paraspinal musculature of the lower lumbar spine and in the buttocks which could represent myositis. Fluid-fluid level in the bladder may represent protein or debris in the bladder. The possibility of urinary tract infection should be considered.  CXR 6/3> improved R lung aeration, worsening L sided opacity. Tracheostomy tube, HD catheter.   CXR 6/4>wosened R lung infiltrates and stable L sided infiltrate. Possible bilateral pleural effusions  Chest x-ray 6/24-basal infiltrates, stable from prior  CXR 7/11 >> 1. Support apparatus as above. 2. Decreasing interstitial opacities. 3. Decreasing effusion and opacity in the left base.  Micro Data:    tracheal aspirate 5/7> for Pseudomonas A  BAL 5/19 > no growth 5/19 blood > NG 5/22 trach aspirate>> candida [place] 5/22 blood>> 1/4 GPC> staph epi 5/25 blood>> GPC (enterococcus on biofire) 2/4 bottles>> E. Faecalis, staph epi. 5/25 trach aspirate>> abundant corynebacterium striatum, few candida parapsilosis 5/25 blood cx>> + for Enterococcus Faecalis (gent resistant)/ Staphylococcus Epidermidis (tetracycline, vanc, rifampin sensitive) 5/27 BCx> neg 6/6 BCx2 >> neg 6/19 trach asp >> Pseudomonas (sensitive to imipenem, intermediate gentamicin) 6/20 BCx2 >> neg 6/30 MRSA PCR >> neg 7/1 UC  >> 30K yeast  7/2 Cdif >> neg 04/01/2020 sputum culture >> pseudomonas (sensitive to gentamicin only)   Antimicrobials:  Levofloxacin 5/24>5/26 vanc 5/25>off 7/5 Ceftriaxone  5/27>6/1 Metronidazole 5/27> off Fortaz 6/1>off Meropenem 03/28/2020>>7/8 Fluconazole 7/8 >> 7/10 Avycaz 7/11 >>  Interim history/subjective:  Pt with eyes open, no track or focus No significant change in respiratory-  ongoing moderate secretions, remains on ATC 5 L  Currently in iHD tmax  99.9 7/14 Ethics Committee meeting doctors are recommending a switch to comfort care as the best treatment. Family are to meet 04/17/2020.  Objective   Blood pressure 111/66, pulse 77, temperature 99.9 F (37.7 C), temperature source Axillary, resp. rate 20, height _0  (1.753 m), weight 66.5 kg, SpO2 98 %.    FiO2 (%):  [28 %] 28 %   Intake/Output Summary (Last 24 hours) at 04/16/2020 0956 Last data filed at 04/16/2020 0600 Gross per 24 hour  Intake 4551.77 ml  Output 3430 ml  Net 1121.77 ml   Filed Weights   04/11/20 0500 04/11/20 0721 04/11/20 1056  Weight: 69 kg 68 kg 66.5 kg    Examination:    General:  Chronically ill frail elderly male lying in bed in NAD HEENT: midline Shiley 6 XLT distal secure and intact Neuro: Awake, no focus or track, ? Purposeful, moves upper extremities spont CV: rr,  R TDC HD cath PULM:  RR 18 , coarse BS, moderate cough, thick tan secretions GI: +bs, NT, ND, gtube Extremities: warm/dry, no LE edema   CXR 7/11- decreasing interstitial opacities, decreasing opacity left base and effusion compared to 7/8  Resolved Hospital Problem list     Assessment & Plan:   Multidrug resistant Pseudomonas HCAP Acute on chronic hypoxemic respiratory failure requiring tracheostomy, prolonged MV COVID fibrosis Deconditioning/ severe protein calorie malnutrition  Generalized failure to thrive Suspected continued aspiration P:  Ongoing aggressive pulm hygiene and trach care NPO given ongoing concerns of aspiration, SLP following  Abx per primary- Avycaz started 7/11 per ID recs  High risk for needing MV given deconditioned state/ ongoing secretions, and mucous plugging Ethics committee recommends comfort care.    Overall, he has a very poor prognosis/ chance for meaningful recovery. Family to meet 7/21.   Not ready for uncuffed trach as continues to have copious secretions.   For routine Trach change 7/20 #6 Shiley XLT cuffed by RT. Supplies are at  bedside  Pulmonary critical care is to see weekly for trach care.   Magdalen Spatz, MSN, AGACNP-BC [place] for personal pager PCCM on call pager [phone] 04/16/2020, 9:56 AM  See Shea Evans for personal pager PCCM on call pager [phone]

## 2020-04-16 NOTE — Progress Notes (Addendum)
[facility] PROGRESS NOTE  Shane Banks BRA:[id number] DOB: [birth year] DOA: 02/14/2020 PCP: [name], MD  Status: Inpatient--Remains inpatient appropriate because :Unsafe d/c plan, IV treatments appropriate due to intensity of illness or inability to take PO and Inpatient level of care appropriate due to severity of illness-patient continues to have tenuous respiratory status   Dispo:  Patient From:  Home  Planned Disposition:    Expected discharge date:    Medically stable for discharge: No-needs to transition to cuffless trach (respiratory status too tenuous to accomplish), needs to be able to sit upright in recliner to pursue dialysis in the outpatient setting (profoundly physically deconditioned and unable to even lift arms at this juncture),   **7/20; spoke with [name].  In addition to patient's children who are planning to visit their estranged father on 7/21 [name] also relates that there are multiple other family members who really need to make contact with this patient prior to decision being made.  This would delay any decision making on Wednesday.  I reassured [name] that this is okay.  I did ask her regarding her decision to contact home health and was this in reference to trying to bring the patient home to die and she replied "yes".  She also let me know patient also has Medicaid in addition to [employer] and [employer] supplement.  Discussed with her that may have difficulty paying for in-home care but assured her we would explore all options including hospice, residential hospice and of course home health.  Also briefly discussed death and dying in the dialysis patient and reassured her that we would continue dialysis right up until transport to ensure patient comfort and stability for transport.  Code Status: Full Family Communication: [name] sister 7/14 and 04/11/20, brother [name] on 04/02/20;sister states children plan to visit 7/21 then will  readdress status DVT prophylaxis: Eliquis  HPI: 74 y.o. 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 Patient treated for Covid pneumonia January 2021 and had been on the vent because of progressive ARDS.  Because of lack of progression he underwent tracheostomy and was [facility].  Was sent back to this facility in May due to trach site bleeding suspected hemoptysis.  He was also noted with sacral decubitus at time of presentation in May.  Since admission he has been treated for recurrent multidrug-resistant Pseudomonas pneumonia and MRSA and enterococcal bacteremia.  On 03/15/2020 patient returned to the ICU for ventilatory and pressor support he developed septic shock and developed acute renal failure requiring dialysis.  He developed sepsis physiology likely secondary to sacral decubitus ulcer with underlying osteomyelitis.    He has been weaned from pressors. PCCM is following for trach weaning with the assistance of SLP.  Patient was liberated from the vent on 03/21/2020 and was transferred to the floor in 03/22/2020.  Due to the lack of insurance approval he cannot go back to [facility].  Social worker is working on Clinical research associate.  He will need to be transition to a cuffless trach which unfortunately has not been accomplished due to recurrent [name] with respiratory failure physiology.  He has been deemed to not be a candidate for outpatient hemodialysis by the nephrologist.  Patient has not made any progress in mobility  OT and PT have signed off.  On the morning of 7/6 patient developed hypoxemia and increased work of breathing with shallow respiratory effort,  requiring frequent suctioning.  O2 sats decreased into the 70s but improved to 95% with bagging and suctioning.  Briefly required increase of FiO2 to 100% but was eventually decreased back to 40%.  Tracheal aspirate appeared consistent with tube feedings therefore tube feedings were stopped.  He was found to have multidrug-resistant Pseudomonas once again only sensitive to gent this time.  Concerns that this may be related to colonization with recommendation from ID to follow.  Patient later developed fevers within 24 hours after that recommendation on the evening of 7/9 so at the recommendation of infectious disease service given multidrug-resistant Pseudomonas recommendation was started on Fortaz plus [name].  Patient reevaluated by SLP on 7/14.  PMV was placed - his exhalatory effort was extremely diminished to the point he had difficulty moving air to the upper airway.  Able to demonstrate increased vocal intensity without definitive phonation.  The expiratory muscle strength trainer was used at the lowest setting of 5 cm of water with little effort but accurate.  Is able to complete 8 repetitions but reported dizziness.  Subjective: Awake.  Denies chest pain or shortness of breath. With passive range of motion of left hand to extend fingers patient grimaced and nodded yes when I asked him if this hurt.  Objective: Vitals:   04/16/20 0850 04/16/20 0900  BP: 111/66   Pulse: 77   Resp: (!) 29 20  Temp: 99.9 F (37.7 C)   SpO2: 97% 98%    Intake/Output Summary (Last 24 hours) at 04/16/2020 1117 Last data filed at 04/16/2020 0800 Gross per 24 hour  Intake 4621.85 ml  Output 3430 ml  Net 1191.85 ml   Filed Weights   04/11/20 0500 04/11/20 0721 04/11/20 1056  Weight: 69 kg 68 kg 66.5 kg    Exam:  Constitutional: NAD, calm and alert today, appears to be comfortable   Neck: midline cuffed trach in place  Respiratory: Bilateral lung sounds coarse on auscultation anteriorly.  Stable,  chronic tachypneic but without significant increased work of breathing, trach collar oxygen/FiO2 28% Cardiovascular: Irregular rate with underlying rhythm atrial flutter rate controlled in the 60s to 80s, loud grade 4/6 systolic murmur which sounds tight LSB, third ICS-no rubs / gallops.  Trace upper extremity edema improving.. 2+ pedal pulses. Dialysis catheter right subclavian vein. Abdomen: no tenderness, no masses palpated. No hepatosplenomegaly. Bowel sounds positive.  Colostomy in place with light brown unformed stool.  Gastrostomy tube with tube feeding GU: Condom catheter in place draining yellow-colored urine to bedside bag Musculoskeletal: no clubbing / cyanosis. No joint deformity upper and lower extremities.  Not spontaneously moving UEs but with PROM has adequate range of motion. Extension contracture involving the right wrist and right elbow.  Also flexion contracture of left hand.  Patient reports discomfort with attempts to extend fingers during PROM.  Diminished muscle tone throughout all extremities and too weak to follow commands with upper extremities/unable to lift extremities upon command  Skin: no rashes, lesions, sacral decubitus not examined Neurologic: CN 2-12 grossly intact. 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  PCCM following periodically to assess readiness for transition cuffless trach. 7/6: Significant respiratory decompensation with FiO2 increased up to 60%. Aspiration and recurrent Pseudomonas pneumonia as etiology. Of note, family  refusing further conversations with palliative medicine team.  Excellent telephonic meeting with ethics physician Dr. Andria Frames on 7/14.  Please see his note for details.  Sister made aware of no meaningful overall recovery including from a pulmonary standpoint. Has completed 8 doses of IV meropenem as of 7/9.   Repeat sputum culture obtained prior to initiation of meropenem has finalized with few MDR Pseudomonas (resistant to imipenem/Carbapenem's but sensitive to gentamicin). Patient developed low-grade fever on 7/9-discussed with ID who recommended Fortaz with [name]-as of 7/19 patient has received 10 days of medication-likely would benefit from a total of 14 days given recurrence of MDR organism Unfortunately given recurrent pneumonia and increased secretions with associated generalized weakness/weak cough it is highly doubtful he can transition to cuffless trach at this point.  Overall prognosis is poor.  Continue chest physiotherapy with vibra vest administered per RT  Goals of care: Ethics team meeting on 7/14 as described above. I will be recontacting patient's Sister [name] today prior to 4 PM to determine if family has reached a decision regarding transitioning patient to comfort care. Please see detailed notes from Dr. Andria Frames dated 7/14 Patient's children will be able to visit with him on Wednesday 7/21.  It is understood that after this visit family will reconvene and make a decision regarding transitioning to comfort care **Patient sister contacted by [facility] nursing subsequently contacted me on 7/19.  Unclear if sister interested in pursuing EOL care at home.  Plan to rediscuss with sister after above completed.  Loud systolic murmur May be physiologic Echo in May with only mild TR Patient did have positive blood cultures from 5/22 with staph epidermis and 5/25 x1 with staph epidermis and Enterococcus. For completeness of evaluation will obtain echocardiogram.  If patient does have  vegetation or valvular damage he would not be a candidate for surgical intervention  Diarrhea Likely noninfectious in etiology-C. difficile negative Suspect related to antibiotics and other medications bolus tube feeding Continues to have watery stools per colostomy 7/14 cholestyramine twice daily initiated Continue maintenance fluids at 30 cc/h  Mild funguria/oral yeast Urine culture with 30,000 colonies of yeast Continue Diflucan-had started last week but fell off medication list and was resumed on 7/8-has completed 10 days so we will discontinue on 7/19  Sacral osteomyelitis and HD catheter sepsis/  Enterococcal bacteremia: Tunneled catheter removal on 02/21/2020. Has tunneled HD catheter right subclavian-side unremarkable Completed 6 weeks of IV vancomycin and Flagyl 7/8  Paroxysmal atrial fibrillation and flutter/Acquired thrombophilia: Currently rate controlled on metoprolol Anticoagulation previously discontinued due to suspected GI bleed and was resumed 5/31 and patient has tolerated it any signs of bleeding.  Chronic kidney disease stage V now requiring hemodialysis 2/2 ATN and AKI from IV gentamicin: On HD since March 2021 status post tunneled catheter placement 02/28/2020 and as of 7/1 site unremarkable. Dialysis days: MWF SNF reported patient will need to be able to sit upright in recliner to receive hemodialysis before they will accept him as a patient-patient has been unable to meet this goal and is not making any progress in regards to physical abilities-OT/PT has signed off Unfortunately, this patient continues to decline, he has significant weakness and is unable to tolerate sitting up in a recliner receive outpatient hemodialysis.  Attending nephrologist has documented that patient is  not a candidate for outpatient hemodialysis  Chronic normocytic anemia: Due to anemia of chronic renal disease Status post 1 unit of PRBCs Hgb on 7/19 was 8.9 Continue erythropoietin and  IV iron per renal.  History of nonhemorrhagic CVA: CT of the head showed atrophy and chronic microvascular disease. 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 Documented as full-thickness tissue loss  Severe protein caloric malnutrition: Tube feedings on hold due to concerns of a possible aspiration as above.  Patient is dialysis patient but will allow D5 normal saline at 30 cc/h to replace tube feeding volume loss Will allow medications per tube with small amounts of flush Nutrition Status: Nutrition Problem: Severe Malnutrition Etiology: chronic illness (chronic respiratory failure related to COVID ARDS) Signs/Symptoms: severe muscle depletion, severe fat depletion Interventions: Prostat, Tube feeding, Juven-continue tube feeding and check residuals every shift       Data Reviewed: Basic Metabolic Panel: Recent Labs  Lab 04/10/20 1010 04/11/20 0730 04/12/20 0543 04/15/20 1026  NA 131* 128* 130* 125*  K 3.9 3.7 3.4* 3.7  CL 96* 94* 98 92*  CO2 _0 GLUCOSE 131* 117* 106* 117*  BUN 91* 116* 57* 107*  CREATININE 2.73* 2.99* 1.87* 2.54*  CALCIUM 9.3 9.3 9.2 9.7  PHOS 4.4 4.6 2.7 3.6   Liver Function Tests: Recent Labs  Lab 04/10/20 1010 04/11/20 0730 04/12/20 0543 04/15/20 1026  ALBUMIN 1.9* 1.8* 2.0* 1.8*   No results for input(s): LIPASE, AMYLASE in the last 168 hours. No results for input(s): AMMONIA in the last 168 hours. CBC: Recent Labs  Lab 04/10/20 1010 04/11/20 0730 04/12/20 0543 04/15/20 1026  WBC 13.7* 13.7* 18.3* 13.2*  HGB 9.1* 8.4* 9.4* 8.9*  HCT 30.0* 28.0* 30.4* 28.5*  MCV 79.6* 80.5 79.8* 76.0*  PLT 265 280 302 283   Cardiac Enzymes: No results for input(s): CKTOTAL, CKMB, CKMBINDEX, TROPONINI in the last 168 hours. BNP (last 3 results) No results for input(s): BNP in the last 8760 hours.  ProBNP (last 3 results) No results for input(s): PROBNP  in the last 8760 hours.  CBG: Recent Labs  Lab 04/15/20 1808 04/15/20 1913 04/15/20 2313 04/16/20 0337 04/16/20 0849  GLUCAP 137* 148* 136* 123* 122*    No results found for this or any previous visit (from the past 240 hour(s)).   Studies: No results found.  Scheduled Meds: . apixaban  2.5 mg Per Tube BID  . chlorhexidine  15 mL Mouth Rinse BID  . chlorhexidine gluconate (MEDLINE KIT)  15 mL Mouth Rinse BID  . Chlorhexidine Gluconate Cloth  6 each Topical Q0600  . cholestyramine  4 g Per Tube BID  . [START ON 04/22/2020] darbepoetin (ARANESP) injection - DIALYSIS  150 mcg Intravenous Q Mon-HD  . feeding supplement (PROSource TF)  45 mL Per Tube TID  . insulin aspart  0-6 Units Subcutaneous Q4H  . insulin aspart  2 Units Subcutaneous Q4H  . levothyroxine  25 mcg Per Tube Q0600  . lipase/protease/amylase)  20,880 Units Per Tube Once   And  . sodium bicarbonate  650 mg Per Tube Once  . mouth rinse  15 mL Mouth Rinse q12n4p  . metoprolol tartrate  25 mg Per Tube BID  . nutrition supplement (JUVEN)  1 packet Per Tube BID BM  . pantoprazole sodium  40 mg Per Tube Daily  . phenylephrine 200 mcg / ml (NON-ED USE ONLY) injection  100 mcg Intracavernosal Once  Continuous Infusions: . ceftazidime avibactam (AVYCAZ) IVPB 0.94 g (04/16/20 1005)  . dextrose 20 mL/hr at 04/15/20 2002  . feeding supplement (NEPRO CARB STEADY) 1,000 mL (04/15/20 1209)    Principal Problem:   Enterococcal bacteremia Active Problems:   Acute respiratory failure (HCC)   Hemoptysis   Pressure injury of skin   Protein-calorie malnutrition, severe   Decubitus ulcer of sacral region, stage 4 (HCC)   Fever   Palliative care by specialist   Goals of care, counseling/discussion   DNR (do not resuscitate)     Consultants: PCCM Ethics committee/Dr. Andria Frames  Procedures: Echocardiogram 1. Left ventricular ejection fraction, by estimation, is 60 to 65%. The  left ventricle has normal function. The  left ventricle has no regional  wall motion abnormalities. There is severe left ventricular hypertrophy.  Left ventricular diastolic parameters  are consistent with Grade I diastolic dysfunction (impaired relaxation).  2. Right ventricular systolic function is normal. The right ventricular  size is normal. There is mildly elevated pulmonary artery systolic  pressure.  3. Left atrial size was mildly dilated.  4. Right atrial size was mildly dilated.  5. The mitral valve is grossly normal. Trivial mitral valve  regurgitation.  6. The aortic valve is tricuspid. Aortic valve regurgitation is trivial.   Antibiotics: Anti-infectives (From admission, onward)   Start     Dose/Rate Route Frequency Ordered Stop   04/12/20 0900  ceftazidime-avibactam (AVYCAZ) 0.94 g in dextrose 5 % 50 mL IVPB     Discontinue     0.94 g 25 mL/hr over 2 Hours Intravenous Every 24 hours 04/11/20 1225     04/10/20 2100  ceftazidime-avibactam (AVYCAZ) 0.94 g in dextrose 5 % 50 mL IVPB  Status:  Discontinued        0.94 g 25 mL/hr over 2 Hours Intravenous Every 12 hours 04/10/20 2005 04/11/20 1225   04/08/20 1420  ceftazidime-avibactam (AVYCAZ) 0.94 g in dextrose 5 % 50 mL IVPB  Status:  Discontinued        0.94 g 25 mL/hr over 2 Hours Intravenous Daily at bedtime 04/08/20 1420 04/10/20 2005   04/07/20 1800  ceftazidime-avibactam (AVYCAZ) 0.94 g in dextrose 5 % 50 mL IVPB  Status:  Discontinued        0.94 g 25 mL/hr over 2 Hours Intravenous Every 12 hours 04/07/20 1322 04/08/20 1420   04/05/20 1000  fluconazole (DIFLUCAN) 40 MG/ML suspension 100 mg  Status:  Discontinued       "Followed by" Linked Group Details   100 mg Per Tube Daily 04/04/20 2203 04/15/20 1248   04/04/20 2300  fluconazole (DIFLUCAN) 40 MG/ML suspension 200 mg       "Followed by" Linked Group Details   200 mg Per Tube  Once 04/04/20 2203 04/04/20 2325   04/01/20 1013  vancomycin (VANCOCIN) 500-5 MG/100ML-% IVPB       Note to Pharmacy: Cherylann Banas   : cabinet override      04/01/20 1013 04/01/20 1253   03/29/20 1552  vancomycin (VANCOCIN) 500-5 MG/100ML-% IVPB       Note to Pharmacy: Anthonette Legato, Melisa   : cabinet override      03/29/20 1552 03/29/20 1556   03/29/20 1415  fluconazole (DIFLUCAN) 40 MG/ML suspension 100 mg        100 mg Oral Daily 03/29/20 1408 03/31/20 1015   03/28/20 2000  meropenem (MERREM) 500 mg in sodium chloride 0.9 % 100 mL IVPB        500  mg 200 mL/hr over 30 Minutes Intravenous Every 24 hours 03/28/20 1623 04/04/20 2201   03/25/20 1800  cefTAZidime (FORTAZ) 1 g in sodium chloride 0.9 % 100 mL IVPB  Status:  Discontinued       "Followed by" Linked Group Details   1 g 200 mL/hr over 30 Minutes Intravenous Every 24 hours 03/18/20 1015 03/28/20 1613   03/22/20 0830  vancomycin (VANCOREADY) IVPB 500 mg/100 mL        500 mg 100 mL/hr over 60 Minutes Intravenous  Once 03/22/20 0827 03/22/20 1014   03/20/20 1200  vancomycin (VANCOCIN) IVPB 500 mg/100 ml premix        500 mg 100 mL/hr over 60 Minutes Intravenous Every M-W-F (Hemodialysis) 03/19/20 0941 04/03/20 1500   03/18/20 1800  meropenem (MERREM) 500 mg in sodium chloride 0.9 % 100 mL IVPB       "Followed by" Linked Group Details   500 mg 200 mL/hr over 30 Minutes Intravenous Every 24 hours 03/18/20 1015 03/25/20 0026   03/18/20 1200  vancomycin (VANCOCIN) IVPB 500 mg/100 ml premix        500 mg 100 mL/hr over 60 Minutes Intravenous Every M-W-F (Hemodialysis) 03/18/20 0938 03/18/20 1700   03/18/20 1200  cefTAZidime (FORTAZ) 2 g in sodium chloride 0.9 % 100 mL IVPB  Status:  Discontinued        2 g 200 mL/hr over 30 Minutes Intravenous Every Mon (Hemodialysis) 03/18/20 0939 03/18/20 1015   03/07/20 1800  cefTAZidime (FORTAZ) 2 g in sodium chloride 0.9 % 100 mL IVPB  Status:  Discontinued        2 g 200 mL/hr over 30 Minutes Intravenous Every T-Th-Sa (1800) 03/06/20 1255 03/18/20 1015   03/07/20 1200  vancomycin (VANCOCIN) IVPB 500 mg/100 ml premix   Status:  Discontinued        500 mg 100 mL/hr over 60 Minutes Intravenous Every T-Th-Sa (Hemodialysis) 03/06/20 1255 03/19/20 0941   03/04/20 2000  cefTAZidime (FORTAZ) 2 g in sodium chloride 0.9 % 100 mL IVPB  Status:  Discontinued        2 g 200 mL/hr over 30 Minutes Intravenous Every M-W-F (2000) 03/01/20 1136 03/06/20 1255   03/02/20 1200  vancomycin (VANCOCIN) IVPB 500 mg/100 ml premix        500 mg 100 mL/hr over 60 Minutes Intravenous Every T-Th-Sa (Hemodialysis) 03/01/20 1131 03/02/20 1710   03/01/20 1230  vancomycin (VANCOCIN) IVPB 500 mg/100 ml premix  Status:  Discontinued        500 mg 100 mL/hr over 60 Minutes Intravenous Every M-W-F (Hemodialysis) 03/01/20 1131 03/06/20 1255   02/28/20 1110  vancomycin (VANCOREADY) IVPB 500 mg/100 mL        over 60 Minutes  Continuous PRN 02/28/20 1112 02/28/20 1110   02/28/20 1101  vancomycin (VANCOCIN) 1-5 GM/200ML-% IVPB  Status:  Discontinued       Note to Pharmacy: Lytle Butte   : cabinet override      02/28/20 1101 02/28/20 1327   02/27/20 1800  cefTAZidime (FORTAZ) 1 g in sodium chloride 0.9 % 100 mL IVPB        1 g 200 mL/hr over 30 Minutes Intravenous Every 24 hours 02/27/20 0947 03/03/20 1738   02/27/20 1200  vancomycin (VANCOCIN) IVPB 500 mg/100 ml premix        500 mg 100 mL/hr over 60 Minutes Intravenous Once 02/27/20 0829 02/27/20 1343   02/24/20 1800  vancomycin (VANCOREADY) IVPB 500 mg/100 mL  500 mg 100 mL/hr over 60 Minutes Intravenous  Once 02/24/20 1308 02/24/20 1905   02/22/20 2200  metroNIDAZOLE (FLAGYL) tablet 500 mg  Status:  Discontinued        500 mg Per Tube Every 8 hours 02/22/20 0957 03/30/20 1057   02/22/20 1700  cefTRIAXone (ROCEPHIN) 2 g in sodium chloride 0.9 % 100 mL IVPB  Status:  Discontinued        2 g 200 mL/hr over 30 Minutes Intravenous Every 24 hours 02/22/20 0957 02/27/20 0947   02/22/20 1200  vancomycin (VANCOCIN) IVPB 500 mg/100 ml premix  Status:  Discontinued        500 mg 100 mL/hr  over 60 Minutes Intravenous Every T-Th-Sa (Hemodialysis) 02/20/20 1253 02/21/20 1040   02/21/20 1431  vancomycin variable dose per unstable renal function (pharmacist dosing)  Status:  Discontinued         Does not apply See admin instructions 02/21/20 1431 03/01/20 1131   02/21/20 1200  vancomycin (VANCOREADY) IVPB 500 mg/100 mL        500 mg 100 mL/hr over 60 Minutes Intravenous  Once 02/21/20 0956 02/21/20 1253   02/21/20 0714  vancomycin (VANCOCIN) 500-5 MG/100ML-% IVPB  Status:  Discontinued       Note to Pharmacy: Ashley Akin   : cabinet override      02/21/20 0714 02/21/20 1346   02/20/20 1400  vancomycin (VANCOREADY) IVPB 1250 mg/250 mL        1,250 mg 166.7 mL/hr over 90 Minutes Intravenous  Once 02/20/20 1253 02/20/20 1538   02/20/20 1200  levofloxacin (LEVAQUIN) IVPB 500 mg  Status:  Discontinued        500 mg 100 mL/hr over 60 Minutes Intravenous Every 48 hours 02/20/20 1020 02/21/20 0943   02/15/20 1545  vancomycin (VANCOCIN) IVPB 1000 mg/200 mL premix  Status:  Discontinued       "Followed by" Linked Group Details   1,000 mg 200 mL/hr over 60 Minutes Intravenous Every 24 hours 02/14/20 1530 02/14/20 1745   02/15/20 0330  ceFEPIme (MAXIPIME) 2 g in sodium chloride 0.9 % 100 mL IVPB  Status:  Discontinued        2 g 200 mL/hr over 30 Minutes Intravenous Every 12 hours 02/14/20 1531 02/14/20 1531   02/14/20 1531  ceFEPIme (MAXIPIME) 2 g in sodium chloride 0.9 % 100 mL IVPB  Status:  Discontinued        2 g 200 mL/hr over 30 Minutes Intravenous Every 12 hours 02/14/20 1531 02/14/20 1745   02/14/20 1530  ceFEPIme (MAXIPIME) 2 g in sodium chloride 0.9 % 100 mL IVPB  Status:  Discontinued        2 g 200 mL/hr over 30 Minutes Intravenous  Once 02/14/20 1520 02/14/20 1531   02/14/20 1530  vancomycin (VANCOREADY) IVPB 1500 mg/300 mL  Status:  Discontinued       "Followed by" Linked Group Details   1,500 mg 150 mL/hr over 120 Minutes Intravenous  Once 02/14/20 1530 02/14/20 1745         Time spent: 25 minutes    Erin Hearing ANP [facility] Pager [phone]. If 7PM-7AM, please contact night-coverage at [email or web address] 04/16/2020, 11:17 AM  LOS: 62 days

## 2020-04-16 NOTE — Progress Notes (Signed)
  Echocardiogram 2D Echocardiogram has been performed.  Bobbye Charleston 04/16/2020, 4:32 PM

## 2020-04-16 NOTE — Progress Notes (Signed)
Patient ID: Shane Banks, male   DOB: 01/08/1946, 74 y.o.   MRN: [id number]  [facility] Progress Note   Assessment/ Plan:   1.  Acute on chronic hypoxic respiratory failure: Status post tracheostomy with history of recurrent HCAP.  2. ESRD: We will continue with hemodialysis a Monday/Wednesday/Friday schedule.  Given his current deconditioning and nutritional needs as well as ongoing tracheostomy care requirements; he is physically not  suitable for outpatient hemodialysis placement at this time  3. Anemia of CKD: Likely secondary to underlying end-stage renal disease and compounded by acute illness/inflammatory component.  Continue ESA.  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  Continue to follow off of vitamin D receptor analogs  5. Nutrition: tube feeds due to concerns of recurrent aspiration.  6. Hypertension: Blood pressures within acceptable range, optimize volume with HD   Subjective:   Last HD on 7/19 with 2.5 kg UF.  He is not able to offer additional history given mental status   Review of systems: unable to obtain 2/2 AMS/patient factors    Objective:   BP 127/68   Pulse 85   Temp 98.3 F (36.8 C) (Oral)   Resp (!) 24   Ht 5' 9"  (1.753 m)   Wt 66.5 kg   SpO2 97%   BMI 21.65 kg/m   Physical Exam:   Gen: Chronically ill-appearing and cachectic man with tracheostomy. CVS: S1 and S2 no rub Resp: Coarse breath sounds bilaterally, no distinct rales or rhonchi.  RR 24-26 on my exam Abd: Soft, colostomy bag in situ.  Feeding tube in situ Ext: Trace upper extremity and lower extremity edema bilaterally; heel protection in place Neuro - awake/eyes open on arrival; does not follow simple motor commands- ie show me your thumb Access: RIJ tunneled catheter  Labs: BMET Recent Labs  Lab 04/10/20 1010 04/11/20 0730 04/12/20 0543  04/15/20 1026  NA 131* 128* 130* 125*  K 3.9 3.7 3.4* 3.7  CL 96* 94* 98 92*  CO2 26 25 24 24   GLUCOSE 131* 117* 106* 117*  BUN 91* 116* 57* 107*  CREATININE 2.73* 2.99* 1.87* 2.54*  CALCIUM 9.3 9.3 9.2 9.7  PHOS 4.4 4.6 2.7 3.6   CBC Recent Labs  Lab 04/10/20 1010 04/11/20 0730 04/12/20 0543 04/15/20 1026  WBC 13.7* 13.7* 18.3* 13.2*  HGB 9.1* 8.4* 9.4* 8.9*  HCT 30.0* 28.0* 30.4* 28.5*  MCV 79.6* 80.5 79.8* 76.0*  PLT 265 280 302 283      Medications:    . apixaban  2.5 mg Per Tube BID  . chlorhexidine  15 mL Mouth Rinse BID  . chlorhexidine gluconate (MEDLINE KIT)  15 mL Mouth Rinse BID  . Chlorhexidine Gluconate Cloth  6 each Topical Q0600  . cholestyramine  4 g Per Tube BID  . darbepoetin (ARANESP) injection - DIALYSIS  100 mcg Intravenous Q Mon-HD  . feeding supplement (PROSource TF)  45 mL Per Tube TID  . insulin aspart  0-6 Units Subcutaneous Q4H  . insulin aspart  2 Units Subcutaneous Q4H  . levothyroxine  25 mcg Per Tube Q0600  . lipase/protease/amylase)  20,880 Units Per Tube Once   And  . sodium bicarbonate  650 mg Per Tube Once  . mouth rinse  15 mL Mouth Rinse q12n4p  . metoprolol tartrate  25 mg Per Tube BID  . nutrition supplement (JUVEN)  1  packet Per Tube BID BM  . pantoprazole sodium  40 mg Per Tube Daily  . phenylephrine 200 mcg / ml (NON-ED USE ONLY) injection  100 mcg Intracavernosal Once   Claudia Desanctis, MD 04/16/2020, 8:29 AM

## 2020-04-17 LAB — RENAL FUNCTION PANEL
Albumin: 1.8 g/dL — ABNORMAL LOW (ref 3.5–5.0)
Anion gap: 9 (ref 5–15)
BUN: 101 mg/dL — ABNORMAL HIGH (ref 8–23)
CO2: 23 mmol/L (ref 22–32)
Calcium: 9.3 mg/dL (ref 8.9–10.3)
Chloride: 92 mmol/L — ABNORMAL LOW (ref 98–111)
Creatinine, Ser: 2.26 mg/dL — ABNORMAL HIGH (ref 0.61–1.24)
GFR calc Af Amer: 32 mL/min — ABNORMAL LOW (ref >60)
GFR calc non Af Amer: 28 mL/min — ABNORMAL LOW (ref >60)
Glucose, Bld: 151 mg/dL — ABNORMAL HIGH (ref 70–99)
Phosphorus: 3.4 mg/dL (ref 2.5–4.6)
Potassium: 3.8 mmol/L (ref 3.5–5.1)
Sodium: 124 mmol/L — ABNORMAL LOW (ref 135–145)

## 2020-04-17 LAB — GLUCOSE, CAPILLARY
Glucose-Capillary: 122 mg/dL — ABNORMAL HIGH (ref 70–99)
Glucose-Capillary: 127 mg/dL — ABNORMAL HIGH (ref 70–99)
Glucose-Capillary: 130 mg/dL — ABNORMAL HIGH (ref 70–99)
Glucose-Capillary: 134 mg/dL — ABNORMAL HIGH (ref 70–99)
Glucose-Capillary: 147 mg/dL — ABNORMAL HIGH (ref 70–99)
Glucose-Capillary: 168 mg/dL — ABNORMAL HIGH (ref 70–99)

## 2020-04-17 LAB — HEPATITIS B SURFACE ANTIGEN: Hepatitis B Surface Ag: NONREACTIVE

## 2020-04-17 LAB — CBC
HCT: 28.6 % — ABNORMAL LOW (ref 39.0–52.0)
Hemoglobin: 8.8 g/dL — ABNORMAL LOW (ref 13.0–17.0)
MCH: 23.3 pg — ABNORMAL LOW (ref 26.0–34.0)
MCHC: 30.8 g/dL (ref 30.0–36.0)
MCV: 75.9 fL — ABNORMAL LOW (ref 80.0–100.0)
Platelets: 285 10*3/uL (ref 150–400)
RBC: 3.77 MIL/uL — ABNORMAL LOW (ref 4.22–5.81)
RDW: 17.3 % — ABNORMAL HIGH (ref 11.5–15.5)
WBC: 17.6 10*3/uL — ABNORMAL HIGH (ref 4.0–10.5)
nRBC: 0 % (ref 0.0–0.2)

## 2020-04-17 MED ORDER — ALBUMIN HUMAN 25 % IV SOLN
INTRAVENOUS | Status: AC
  Administered 2020-04-17: 12.5 g
  Filled 2020-04-17: qty 50

## 2020-04-17 MED ORDER — HEPARIN SODIUM (PORCINE) 1000 UNIT/ML IJ SOLN
INTRAMUSCULAR | Status: AC
  Administered 2020-04-17: 1000 [IU]
  Filled 2020-04-17: qty 4

## 2020-04-17 NOTE — Progress Notes (Signed)
MEWS 4 which has been an intermittent baseline for this patient.  Patient with low systolic blood pressure readings between 88 and 96 while on dialysis, persistent tachypnea, low-grade axillary temperature 99.8 but maintaining O2 sats of 96%.  Patient at baseline mentation and was able to nod appropriately and mouth responses to questions asked.  No change in baseline tachypnea.  Lung sounds more coarse today.  Patient also noted with increasing trend of white count.  Completed a course of Fortaz and [name] as of 7/21.  Full note to follow.

## 2020-04-17 NOTE — Progress Notes (Signed)
[facility] PROGRESS NOTE  Kamarion Zagami BRA:[id number] DOB: [birth year] DOA: 02/14/2020 PCP: [name], MD  Status: Inpatient--Remains inpatient appropriate because :Unsafe d/c plan, IV treatments appropriate due to intensity of illness or inability to take PO and Inpatient level of care appropriate due to severity of illness-patient continues to have tenuous respiratory status   Dispo:  Patient From:  Home  Planned Disposition:    Expected discharge date:    Medically stable for discharge: No-needs to transition to cuffless trach (respiratory status too tenuous to accomplish), needs to be able to sit upright in recliner to pursue dialysis in the outpatient setting (profoundly physically deconditioned and unable to even lift arms at this juncture),   **7/20; spoke with [name].  In addition to patient's children who are planning to visit their estranged father on 7/21 [name] also relates that there are multiple other family members who really need to make contact with this patient prior to decision being made.  This would delay any decision making on Wednesday.  I reassured [name] that this is okay.  I did ask her regarding her decision to contact home health and was this in reference to trying to bring the patient home to die and she replied "yes".  She also let me know patient also has Medicaid in addition to [employer] and [employer] supplement.  Discussed with her that may have difficulty paying for in-home care but assured her we would explore all options including hospice, residential hospice and of course home health.  Also briefly discussed death and dying in the dialysis patient and reassured her that we would continue dialysis right up until transport to ensure patient comfort and stability for transport.  Code Status: Full Family Communication: [name] sister 7/14 and 04/11/20, brother [name] on 04/02/20;sister states children plan to visit 7/21 then will  readdress status DVT prophylaxis: Eliquis  HPI: 74 y.o. 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 Patient treated for Covid pneumonia January 2021 and had been on the vent because of progressive ARDS.  Because of lack of progression he underwent tracheostomy and was [facility].  Was sent back to this facility in May due to trach site bleeding suspected hemoptysis.  He was also noted with sacral decubitus at time of presentation in May.  Since admission he has been treated for recurrent multidrug-resistant Pseudomonas pneumonia and MRSA and enterococcal bacteremia.  On 03/15/2020 patient returned to the ICU for ventilatory and pressor support he developed septic shock and developed acute renal failure requiring dialysis.  He developed sepsis physiology likely secondary to sacral decubitus ulcer with underlying osteomyelitis.    He has been weaned from pressors. PCCM is following for trach weaning with the assistance of SLP.  Patient was liberated from the vent on 03/21/2020 and was transferred to the floor in 03/22/2020.  Due to the lack of insurance approval he cannot go back to [facility].  Social worker is working on Clinical research associate.  He will need to be transition to a cuffless trach which unfortunately has not been accomplished due to recurrent [name] with respiratory failure physiology.  He has been deemed to not be a candidate for outpatient hemodialysis by the nephrologist.  Patient has not made any progress in mobility  OT and PT have signed off.  On the morning of 7/6 patient developed hypoxemia and increased work of breathing with shallow respiratory effort,  requiring frequent suctioning.  O2 sats decreased into the 70s but improved to 95% with bagging and suctioning.  Briefly required increase of FiO2 to 100% but was eventually decreased back to 40%.  Tracheal aspirate appeared consistent with tube feedings therefore tube feedings were stopped.  He was found to have multidrug-resistant Pseudomonas once again only sensitive to gent this time.  Concerns that this may be related to colonization with recommendation from ID to follow.  Patient later developed fevers within 24 hours after that recommendation on the evening of 7/9 so at the recommendation of infectious disease service given multidrug-resistant Pseudomonas recommendation was started on Fortaz plus [name].  Patient reevaluated by SLP on 7/14.  PMV was placed - his exhalatory effort was extremely diminished to the point he had difficulty moving air to the upper airway.  Able to demonstrate increased vocal intensity without definitive phonation.  The expiratory muscle strength trainer was used at the lowest setting of 5 cm of water with little effort but accurate.  Is able to complete 8 repetitions but reported dizziness.  7/21: Although sister did initiate conversation with home health agency she still remains conflicted about definite discharge plan and transitioning to comfort measures.  Awaiting other family members to visit patient so they can see how poorly he is doing and hopefully with their encouragement she will be able to move forward with recommended plan to transition to comfort measures  Subjective: Awake.  Nodding appropriately to questions asked with no specific complaints determined with specific questioning.  Objective: Vitals:   04/17/20 1115 04/17/20 1212  BP: (!) 112/51 112/61  Pulse: 89 68  Resp: 20 13  Temp: 98.1 F (36.7 C) 98.9 F (37.2 C)  SpO2:  96%    Intake/Output Summary (Last 24 hours) at 04/17/2020 1312 Last data filed at 04/17/2020 1113 Gross per 24 hour   Intake 1680 ml  Output 2500 ml  Net -820 ml   Filed Weights   04/17/20 0426 04/17/20 0730 04/17/20 1113  Weight: 68.2 kg 68.2 kg 66.2 kg    Exam:  Constitutional: NAD, calm and alert today, appears to be comfortable   Neck: midline cuffed trach in place  Respiratory: Bilateral lung sounds coarse on auscultation anteriorly.  Stable, chronic tachypneic but without significant increased work of breathing, trach collar oxygen/FiO2 28% Cardiovascular: Irregular rate with underlying rhythm atrial flutter rate controlled in the 60s to 80s, review of murmur no longer audible,no gallops.  Trace upper extremity edema improving.. 2+ pedal pulses. Dialysis catheter right subclavian vein. Abdomen: no tenderness, no masses palpated. No hepatosplenomegaly. Bowel sounds positive.  Colostomy in place with light brown unformed stool.  Gastrostomy tube with tube feeding GU: Condom catheter in place draining yellow-colored urine to bedside bag Musculoskeletal: no clubbing / cyanosis. No joint deformity upper and lower extremities.  Not spontaneously moving UEs but with PROM has adequate range of motion. Extension contracture involving the right wrist and right elbow.  Also flexion contracture of left hand.  Patient reports discomfort with attempts to extend fingers during PROM.  Diminished muscle tone throughout all extremities and too weak to follow commands with upper extremities/unable to lift extremities upon command  Skin: no rashes, lesions, sacral decubitus not examined Neurologic: CN 2-12 grossly intact. 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  PCCM following periodically to assess readiness for transition cuffless trach. 7/6: Significant respiratory decompensation with FiO2 increased up to 60%. Aspiration and recurrent Pseudomonas pneumonia as etiology. Of note, family refusing further conversations with palliative medicine team.  Excellent telephonic meeting with ethics physician Dr. Andria Frames on 7/14.  Please see his note for details.  Sister made aware of no meaningful overall recovery including from a pulmonary standpoint. Has completed 8 doses of IV meropenem as of 7/9.   Repeat sputum culture obtained prior to initiation of meropenem has finalized with few MDR Pseudomonas (resistant to imipenem/Carbapenem but sensitive to gentamicin). Patient developed low-grade fever on 7/9-discussed with ID who recommended Fortaz with [name]-as of 7/19 patient has received 10 days of medication-likely would benefit from a total of 14 days given recurrence of MDR organism Unfortunately given recurrent pneumonia and increased secretions with associated generalized weakness/weak cough it is highly doubtful he can transition to cuffless trach at this point.  Overall prognosis is poor.  Continue chest physiotherapy with vibra vest administered per RT  Goals of care: Ethics team meeting on 7/14 as described above. I will be recontacting patient's Sister [name] today prior to 4 PM to determine if family has reached a decision regarding transitioning patient to comfort care. Please see detailed notes from Dr. Andria Frames dated 7/14 Patient's children will be able to visit with him on Wednesday 7/21.  It is understood that after this visit family will reconvene and make a decision regarding transitioning to comfort care **Patient sister contacted by [facility] nursing subsequently contacted me on 7/19.  7/20 sister confirmed that goal for contacting home health nursing was to determine  if EOL care would be possible in the home setting.  Sister still remains conflicted but she is aware that we are contacting hospice in the [place] area in the event and EOL home care discharge plan needs to be in place.  Residential hospice also on the table.  Loud systolic murmur Likely physiologic physiologic Echo in May with only mild TR Follow-up echo 7/20 without any evidence to explain murmur and as of 7/21 murmur is not auscultated  Hypertrophic cardiomyopathy Patient apparently has had this diagnosis for some time. Echocardiogram completed on 7/20 with hyperdynamic EF of 70% with severe asymmetric LV hypertrophy-interpreting cardiologist confirm diagnosis after chart review of outside records  Diarrhea Likely noninfectious in etiology-C. difficile negative Suspect related to antibiotics and other medications bolus tube feeding Continues to have watery stools per colostomy 7/14 cholestyramine twice daily initiated Continue maintenance fluids at 30 cc/h  Mild funguria/oral yeast Urine culture with 30,000 colonies of yeast Continue Diflucan-had started last week but fell off medication list and was resumed on 7/8-has completed 10 days so we will discontinue on 7/19  Sacral osteomyelitis and HD catheter sepsis/  Enterococcal bacteremia: Tunneled catheter removal on 02/21/2020. Has tunneled HD catheter right subclavian-side unremarkable Completed 6 weeks of IV vancomycin and Flagyl 7/8  Paroxysmal atrial fibrillation and flutter/Acquired thrombophilia: Currently rate controlled on metoprolol Anticoagulation previously discontinued due to suspected GI bleed and was resumed 5/31 and patient has tolerated it any signs of bleeding.  Chronic kidney disease stage V now requiring hemodialysis 2/2 ATN and AKI from IV gentamicin: On HD since March 2021  status post tunneled catheter placement 02/28/2020 and as of 7/1 site unremarkable. Dialysis days: MWF SNF reported patient will need to be  able to sit upright in recliner to receive hemodialysis before they will accept him as a patient-patient has been unable to meet this goal and is not making any progress in regards to physical abilities-OT/PT has signed off Unfortunately, this patient continues to decline, he has significant weakness and is unable to tolerate sitting up in a recliner receive outpatient hemodialysis.  Attending nephrologist has documented that patient is not a candidate for outpatient hemodialysis  Chronic normocytic anemia: Due to anemia of chronic renal disease Status post 1 unit of PRBCs Hgb on 7/19 was 8.9 Continue erythropoietin and IV iron per renal.  History of nonhemorrhagic CVA: CT of the head showed atrophy and chronic microvascular disease. 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 Documented as full-thickness tissue loss  Severe protein caloric malnutrition: Tube feedings on hold due to concerns of a possible aspiration as above.  Patient is dialysis patient but will allow D5 normal saline at 30 cc/h to replace tube feeding volume loss Will allow medications per tube with small amounts of flush Nutrition Status: Nutrition Problem: Severe Malnutrition Etiology: chronic illness (chronic respiratory failure related to COVID ARDS) Signs/Symptoms: severe muscle depletion, severe fat depletion Interventions: Prostat, Tube feeding, Juven-continue tube feeding and check residuals every shift       Data Reviewed: Basic Metabolic Panel: Recent Labs  Lab 04/11/20 0730 04/12/20 0543 04/15/20 1026 04/17/20 0558  NA 128* 130* 125* 124*  K 3.7 3.4* 3.7 3.8  CL 94* 98 92* 92*  CO2 _0 GLUCOSE 117* 106* 117* 151*  BUN 116* 57* 107* 101*  CREATININE 2.99* 1.87* 2.54* 2.26*  CALCIUM 9.3 9.2 9.7 9.3  PHOS 4.6 2.7 3.6 3.4   Liver Function Tests: Recent Labs  Lab 04/11/20 0730 04/12/20 0543  04/15/20 1026 04/17/20 0558  ALBUMIN 1.8* 2.0* 1.8* 1.8*   No results for input(s): LIPASE, AMYLASE in the last 168 hours. No results for input(s): AMMONIA in the last 168 hours. CBC: Recent Labs  Lab 04/11/20 0730 04/12/20 0543 04/15/20 1026 04/17/20 0558  WBC 13.7* 18.3* 13.2* 17.6*  HGB 8.4* 9.4* 8.9* 8.8*  HCT 28.0* 30.4* 28.5* 28.6*  MCV 80.5 79.8* 76.0* 75.9*  PLT 280 302 283 285   Cardiac Enzymes: No results for input(s): CKTOTAL, CKMB, CKMBINDEX, TROPONINI in the last 168 hours. BNP (last 3 results) No results for input(s): BNP in the last 8760 hours.  ProBNP (last 3 results) No results for input(s): PROBNP in the last 8760 hours.  CBG: Recent Labs  Lab 04/16/20 1907 04/16/20 2314 04/17/20 0321 04/17/20 0818 04/17/20 1211  GLUCAP 145* 113* 127* 130* 134*    No results found for this or any previous visit (from the past 240 hour(s)).   Studies: DG CHEST PORT 1 VIEW  Result Date: 04/16/2020 CLINICAL DATA:  Short of breath, congestion, bleeding around tracheostomy site EXAM: PORTABLE CHEST 1 VIEW COMPARISON:  04/07/2020 FINDINGS: Single frontal view of the chest demonstrates stable tracheostomy tube and right internal jugular dialysis catheter. The cardiac silhouette is unchanged. There is persistent consolidation and/or effusion at the left lung base. Stable interstitial prominence at the lung bases. No pneumothorax. IMPRESSION: 1. Stable support devices. 2. Persistent left basilar consolidation and/or effusion. Electronically Signed   By: Randa Ngo M.D.   On: 04/16/2020 19:26  ECHOCARDIOGRAM COMPLETE  Result Date: 04/16/2020    ECHOCARDIOGRAM REPORT   Patient Name:   Shane Banks Date of Exam: 04/16/2020 Medical Rec #:  [id number]    Height:       69.0 in Accession #:    [id number]   Weight:       146.6 lb Date of Birth:  1946/01/21    BSA:          1.811 m Patient Age:    75 years     BP:           137/69 mmHg Patient Gender: M            HR:           86  bpm. Exam Location:  Inpatient Procedure: 2D Echo, Cardiac Doppler and Color Doppler Indications:    R01.1 Murmur  History:        Patient has prior history of Echocardiogram examinations, most                 recent 02/12/2020. Arrythmias:Atrial Fibrillation;                 Signs/Symptoms:Fever, Bacteremia and Dizziness/Lightheadedness.                 History of Covid 19 infection and pneumonia. Respiratory                 failure.  Sonographer:    Roseanna Rainbow RDCS Referring Phys: [id number] Samella Parr  Sonographer Comments: Technically difficult study due to poor echo windows. Patient has trach collar. Apical images extremely lateral. Could not turn patient. IMPRESSIONS  1. There is severe asymmetric hypertrophy of the septum up to 2.0 cm. There is a mid cavitary gradient up to 10 mmHG due to cavity obliteration in systole. There is no SAM of the mitral valve. Findings are concerning for hypertrophic cardiomyopathy, reverse curvature type. Per review of care everywhere, the patient was last seen by cardiology on 07/20/2017 at [facility] [place]) and this is a well-known diagnosis. Left ventricular ejection fraction, by estimation, is 70 to 75%. The left ventricle has hyperdynamic function. The left ventricle has no regional wall motion abnormalities. There is severe asymmetric left ventricular hypertrophy of the septal segment. Left ventricular diastolic function could not be evaluated.  2. Right ventricular systolic function is mildly reduced. The right ventricular size is normal. Tricuspid regurgitation signal is inadequate for assessing PA pressure.  3. The mitral valve is degenerative. No evidence of mitral valve regurgitation. No evidence of mitral stenosis.  4. The aortic valve is tricuspid. Aortic valve regurgitation is not visualized. No aortic stenosis is present.  5. The inferior vena cava is normal in size with greater than 50% respiratory variability, suggesting right atrial pressure of 3 mmHg.  Comparison(s): Changes from prior study are noted. Atrial flutter is now present. Hypertrophic cardiomyopathy is still present. Conclusion(s)/Recommendation(s): Findings consistent with hypertrophic cardiomyopathy. FINDINGS  Left Ventricle: There is severe asymmetric hypertrophy of the septum up to 2.0 cm. There is a mid cavitary gradient up to 10 mmHG due to cavity obliteration in systole. There is no SAM of the mitral valve. Findings are concerning for hypertrophic cardiomyopathy, reverse curvature type. Per review of care everywhere, the patient was last seen by cardiology on 07/20/2017 at [facility] [place]) and this is a well-known diagnosis. Left ventricular ejection fraction, by estimation, is 70 to 75%. The left ventricle has hyperdynamic function. The left ventricle has  no regional wall motion abnormalities. The left ventricular internal cavity size was normal in size. There is severe asymmetric left ventricular hypertrophy of the septal segment. Left ventricular diastolic function could not be evaluated due to atrial fibrillation. Left ventricular diastolic function could not be evaluated. Right Ventricle: The right ventricular size is normal. No increase in right ventricular wall thickness. Right ventricular systolic function is mildly reduced. Tricuspid regurgitation signal is inadequate for assessing PA pressure. Left Atrium: Left atrial size was normal in size. Right Atrium: Right atrial size was normal in size. Pericardium: Trivial pericardial effusion is present. Mitral Valve: The mitral valve is degenerative in appearance. Mild mitral annular calcification. No evidence of mitral valve regurgitation. No evidence of mitral valve stenosis. Tricuspid Valve: The tricuspid valve is grossly normal. Tricuspid valve regurgitation is mild . No evidence of tricuspid stenosis. Aortic Valve: The aortic valve is tricuspid. Aortic valve regurgitation is not visualized. No aortic stenosis is present.  Pulmonic Valve: The pulmonic valve was grossly normal. Pulmonic valve regurgitation is not visualized. No evidence of pulmonic stenosis. Aorta: The aortic root is normal in size and structure. Venous: The inferior vena cava is normal in size with greater than 50% respiratory variability, suggesting right atrial pressure of 3 mmHg. IAS/Shunts: The atrial septum is grossly normal.  LEFT VENTRICLE PLAX 2D LVIDd:         3.60 cm     Diastology LVIDs:         2.20 cm     LV e' lateral:   12.60 cm/s LV PW:         1.40 cm     LV E/e' lateral: 6.9 LV IVS:        2.00 cm     LV e' medial:    6.96 cm/s LVOT diam:     1.80 cm     LV E/e' medial:  12.5 LV SV:         40 LV SV Index:   22 LVOT Area:     2.54 cm  LV Volumes (MOD) LV vol d, MOD A2C: 49.8 ml LV vol d, MOD A4C: 38.4 ml LV vol s, MOD A2C: 11.3 ml LV vol s, MOD A4C: 11.3 ml LV SV MOD A2C:     38.5 ml LV SV MOD A4C:     38.4 ml LV SV MOD BP:      31.4 ml RIGHT VENTRICLE            IVC RV S prime:     9.79 cm/s  IVC diam: 2.00 cm TAPSE (M-mode): 1.6 cm LEFT ATRIUM             Index       RIGHT ATRIUM           Index LA diam:        4.90 cm 2.71 cm/m  RA Area:     14.50 cm LA Vol (A2C):   60.1 ml 33.19 ml/m RA Volume:   33.70 ml  18.61 ml/m LA Vol (A4C):   17.2 ml 9.50 ml/m LA Biplane Vol: 33.1 ml 18.28 ml/m  AORTIC VALVE LVOT Vmax:   108.00 cm/s LVOT Vmean:  79.800 cm/s LVOT VTI:    0.157 m  AORTA Ao Root diam: 4.10 cm MITRAL VALVE MV Area (PHT): 4.15 cm    SHUNTS MV Decel Time: 183 msec    Systemic VTI:  0.16 m MV E velocity: 86.83 cm/s  Systemic Diam: 1.80 cm Eleonore Chiquito MD  Electronically signed by Eleonore Chiquito MD Signature Date/Time: 04/16/2020/6:30:28 PM    Final     Scheduled Meds: . apixaban  2.5 mg Per Tube BID  . chlorhexidine  15 mL Mouth Rinse BID  . chlorhexidine gluconate (MEDLINE KIT)  15 mL Mouth Rinse BID  . Chlorhexidine Gluconate Cloth  6 each Topical Q0600  . Chlorhexidine Gluconate Cloth  6 each Topical Q0600  . cholestyramine  4 g  Per Tube BID  . [START ON 04/22/2020] darbepoetin (ARANESP) injection - DIALYSIS  150 mcg Intravenous Q Mon-HD  . feeding supplement (PROSource TF)  45 mL Per Tube TID  . insulin aspart  0-6 Units Subcutaneous Q4H  . insulin aspart  2 Units Subcutaneous Q4H  . levothyroxine  25 mcg Per Tube Q0600  . mouth rinse  15 mL Mouth Rinse q12n4p  . metoprolol tartrate  25 mg Per Tube BID  . nutrition supplement (JUVEN)  1 packet Per Tube BID BM  . pantoprazole sodium  40 mg Per Tube Daily   Continuous Infusions: . dextrose 20 mL/hr at 04/16/20 2232  . feeding supplement (NEPRO CARB STEADY) 1,000 mL (04/17/20 0421)    Principal Problem:   Enterococcal bacteremia Active Problems:   Acute respiratory failure (HCC)   Hemoptysis   Pressure injury of skin   Protein-calorie malnutrition, severe   Decubitus ulcer of sacral region, stage 4 (HCC)   Fever   Palliative care by specialist   Goals of care, counseling/discussion   DNR (do not resuscitate)     Consultants: PCCM Ethics committee/Dr. Andria Frames  Procedures: Echocardiogram 1. Left ventricular ejection fraction, by estimation, is 60 to 65%. The  left ventricle has normal function. The left ventricle has no regional  wall motion abnormalities. There is severe left ventricular hypertrophy.  Left ventricular diastolic parameters  are consistent with Grade I diastolic dysfunction (impaired relaxation).  2. Right ventricular systolic function is normal. The right ventricular  size is normal. There is mildly elevated pulmonary artery systolic  pressure.  3. Left atrial size was mildly dilated.  4. Right atrial size was mildly dilated.  5. The mitral valve is grossly normal. Trivial mitral valve  regurgitation.  6. The aortic valve is tricuspid. Aortic valve regurgitation is trivial.   Antibiotics: Anti-infectives (From admission, onward)   Start     Dose/Rate Route Frequency Ordered Stop   04/12/20 0900  ceftazidime-avibactam  (AVYCAZ) 0.94 g in dextrose 5 % 50 mL IVPB  Status:  Discontinued        0.94 g 25 mL/hr over 2 Hours Intravenous Every 24 hours 04/11/20 1225 04/17/20 1034   04/10/20 2100  ceftazidime-avibactam (AVYCAZ) 0.94 g in dextrose 5 % 50 mL IVPB  Status:  Discontinued        0.94 g 25 mL/hr over 2 Hours Intravenous Every 12 hours 04/10/20 2005 04/11/20 1225   04/08/20 1420  ceftazidime-avibactam (AVYCAZ) 0.94 g in dextrose 5 % 50 mL IVPB  Status:  Discontinued        0.94 g 25 mL/hr over 2 Hours Intravenous Daily at bedtime 04/08/20 1420 04/10/20 2005   04/07/20 1800  ceftazidime-avibactam (AVYCAZ) 0.94 g in dextrose 5 % 50 mL IVPB  Status:  Discontinued        0.94 g 25 mL/hr over 2 Hours Intravenous Every 12 hours 04/07/20 1322 04/08/20 1420   04/05/20 1000  fluconazole (DIFLUCAN) 40 MG/ML suspension 100 mg  Status:  Discontinued       "Followed by" Linked Group  Details   100 mg Per Tube Daily 04/04/20 2203 04/15/20 1248   04/04/20 2300  fluconazole (DIFLUCAN) 40 MG/ML suspension 200 mg       "Followed by" Linked Group Details   200 mg Per Tube  Once 04/04/20 2203 04/04/20 2325   04/01/20 1013  vancomycin (VANCOCIN) 500-5 MG/100ML-% IVPB       Note to Pharmacy: Cherylann Banas   : cabinet override      04/01/20 1013 04/01/20 1253   03/29/20 1552  vancomycin (VANCOCIN) 500-5 MG/100ML-% IVPB       Note to Pharmacy: Anthonette Legato, Melisa   : cabinet override      03/29/20 1552 03/29/20 1556   03/29/20 1415  fluconazole (DIFLUCAN) 40 MG/ML suspension 100 mg        100 mg Oral Daily 03/29/20 1408 03/31/20 1015   03/28/20 2000  meropenem (MERREM) 500 mg in sodium chloride 0.9 % 100 mL IVPB        500 mg 200 mL/hr over 30 Minutes Intravenous Every 24 hours 03/28/20 1623 04/04/20 2201   03/25/20 1800  cefTAZidime (FORTAZ) 1 g in sodium chloride 0.9 % 100 mL IVPB  Status:  Discontinued       "Followed by" Linked Group Details   1 g 200 mL/hr over 30 Minutes Intravenous Every 24 hours 03/18/20 1015 03/28/20  1613   03/22/20 0830  vancomycin (VANCOREADY) IVPB 500 mg/100 mL        500 mg 100 mL/hr over 60 Minutes Intravenous  Once 03/22/20 0827 03/22/20 1014   03/20/20 1200  vancomycin (VANCOCIN) IVPB 500 mg/100 ml premix        500 mg 100 mL/hr over 60 Minutes Intravenous Every M-W-F (Hemodialysis) 03/19/20 0941 04/03/20 1500   03/18/20 1800  meropenem (MERREM) 500 mg in sodium chloride 0.9 % 100 mL IVPB       "Followed by" Linked Group Details   500 mg 200 mL/hr over 30 Minutes Intravenous Every 24 hours 03/18/20 1015 03/25/20 0026   03/18/20 1200  vancomycin (VANCOCIN) IVPB 500 mg/100 ml premix        500 mg 100 mL/hr over 60 Minutes Intravenous Every M-W-F (Hemodialysis) 03/18/20 0938 03/18/20 1700   03/18/20 1200  cefTAZidime (FORTAZ) 2 g in sodium chloride 0.9 % 100 mL IVPB  Status:  Discontinued        2 g 200 mL/hr over 30 Minutes Intravenous Every Mon (Hemodialysis) 03/18/20 0939 03/18/20 1015   03/07/20 1800  cefTAZidime (FORTAZ) 2 g in sodium chloride 0.9 % 100 mL IVPB  Status:  Discontinued        2 g 200 mL/hr over 30 Minutes Intravenous Every T-Th-Sa (1800) 03/06/20 1255 03/18/20 1015   03/07/20 1200  vancomycin (VANCOCIN) IVPB 500 mg/100 ml premix  Status:  Discontinued        500 mg 100 mL/hr over 60 Minutes Intravenous Every T-Th-Sa (Hemodialysis) 03/06/20 1255 03/19/20 0941   03/04/20 2000  cefTAZidime (FORTAZ) 2 g in sodium chloride 0.9 % 100 mL IVPB  Status:  Discontinued        2 g 200 mL/hr over 30 Minutes Intravenous Every M-W-F (2000) 03/01/20 1136 03/06/20 1255   03/02/20 1200  vancomycin (VANCOCIN) IVPB 500 mg/100 ml premix        500 mg 100 mL/hr over 60 Minutes Intravenous Every T-Th-Sa (Hemodialysis) 03/01/20 1131 03/02/20 1710   03/01/20 1230  vancomycin (VANCOCIN) IVPB 500 mg/100 ml premix  Status:  Discontinued  500 mg 100 mL/hr over 60 Minutes Intravenous Every M-W-F (Hemodialysis) 03/01/20 1131 03/06/20 1255   02/28/20 1110  vancomycin (VANCOREADY)  IVPB 500 mg/100 mL        over 60 Minutes  Continuous PRN 02/28/20 1112 02/28/20 1110   02/28/20 1101  vancomycin (VANCOCIN) 1-5 GM/200ML-% IVPB  Status:  Discontinued       Note to Pharmacy: Lytle Butte   : cabinet override      02/28/20 1101 02/28/20 1327   02/27/20 1800  cefTAZidime (FORTAZ) 1 g in sodium chloride 0.9 % 100 mL IVPB        1 g 200 mL/hr over 30 Minutes Intravenous Every 24 hours 02/27/20 0947 03/03/20 1738   02/27/20 1200  vancomycin (VANCOCIN) IVPB 500 mg/100 ml premix        500 mg 100 mL/hr over 60 Minutes Intravenous Once 02/27/20 0829 02/27/20 1343   02/24/20 1800  vancomycin (VANCOREADY) IVPB 500 mg/100 mL        500 mg 100 mL/hr over 60 Minutes Intravenous  Once 02/24/20 1308 02/24/20 1905   02/22/20 2200  metroNIDAZOLE (FLAGYL) tablet 500 mg  Status:  Discontinued        500 mg Per Tube Every 8 hours 02/22/20 0957 03/30/20 1057   02/22/20 1700  cefTRIAXone (ROCEPHIN) 2 g in sodium chloride 0.9 % 100 mL IVPB  Status:  Discontinued        2 g 200 mL/hr over 30 Minutes Intravenous Every 24 hours 02/22/20 0957 02/27/20 0947   02/22/20 1200  vancomycin (VANCOCIN) IVPB 500 mg/100 ml premix  Status:  Discontinued        500 mg 100 mL/hr over 60 Minutes Intravenous Every T-Th-Sa (Hemodialysis) 02/20/20 1253 02/21/20 1040   02/21/20 1431  vancomycin variable dose per unstable renal function (pharmacist dosing)  Status:  Discontinued         Does not apply See admin instructions 02/21/20 1431 03/01/20 1131   02/21/20 1200  vancomycin (VANCOREADY) IVPB 500 mg/100 mL        500 mg 100 mL/hr over 60 Minutes Intravenous  Once 02/21/20 0956 02/21/20 1253   02/21/20 0714  vancomycin (VANCOCIN) 500-5 MG/100ML-% IVPB  Status:  Discontinued       Note to Pharmacy: Ashley Akin   : cabinet override      02/21/20 0714 02/21/20 1346   02/20/20 1400  vancomycin (VANCOREADY) IVPB 1250 mg/250 mL        1,250 mg 166.7 mL/hr over 90 Minutes Intravenous  Once 02/20/20 1253  02/20/20 1538   02/20/20 1200  levofloxacin (LEVAQUIN) IVPB 500 mg  Status:  Discontinued        500 mg 100 mL/hr over 60 Minutes Intravenous Every 48 hours 02/20/20 1020 02/21/20 0943   02/15/20 1545  vancomycin (VANCOCIN) IVPB 1000 mg/200 mL premix  Status:  Discontinued       "Followed by" Linked Group Details   1,000 mg 200 mL/hr over 60 Minutes Intravenous Every 24 hours 02/14/20 1530 02/14/20 1745   02/15/20 0330  ceFEPIme (MAXIPIME) 2 g in sodium chloride 0.9 % 100 mL IVPB  Status:  Discontinued        2 g 200 mL/hr over 30 Minutes Intravenous Every 12 hours 02/14/20 1531 02/14/20 1531   02/14/20 1531  ceFEPIme (MAXIPIME) 2 g in sodium chloride 0.9 % 100 mL IVPB  Status:  Discontinued        2 g 200 mL/hr over 30 Minutes Intravenous Every 12  hours 02/14/20 1531 02/14/20 1745   02/14/20 1530  ceFEPIme (MAXIPIME) 2 g in sodium chloride 0.9 % 100 mL IVPB  Status:  Discontinued        2 g 200 mL/hr over 30 Minutes Intravenous  Once 02/14/20 1520 02/14/20 1531   02/14/20 1530  vancomycin (VANCOREADY) IVPB 1500 mg/300 mL  Status:  Discontinued       "Followed by" Linked Group Details   1,500 mg 150 mL/hr over 120 Minutes Intravenous  Once 02/14/20 1530 02/14/20 1745        Time spent: 25 minutes    Erin Hearing ANP [facility] Pager [phone]. If 7PM-7AM, please contact night-coverage at [email or web address] 04/17/2020, 1:12 PM  LOS: 63 days

## 2020-04-17 NOTE — Plan of Care (Signed)
  Problem: Clinical Measurements: Goal: Ability to maintain clinical measurements within normal limits will improve Outcome: Progressing Goal: Will remain free from infection Outcome: Progressing Goal: Diagnostic test results will improve Outcome: Progressing Goal: Respiratory complications will improve Outcome: Progressing Goal: Cardiovascular complication will be avoided Outcome: Progressing   Problem: Nutrition: Goal: Adequate nutrition will be maintained Outcome: Progressing   Problem: Elimination: Goal: Will not experience complications related to bowel motility Outcome: Progressing Goal: Will not experience complications related to urinary retention Outcome: Progressing   Problem: Pain Managment: Goal: General experience of comfort will improve Outcome: Progressing   Problem: Safety: Goal: Ability to remain free from injury will improve Outcome: Progressing   Problem: Education: Goal: Knowledge of General Education information will improve Description: Including pain rating scale, medication(s)/side effects and non-pharmacologic comfort measures Outcome: Not Progressing   Problem: Health Behavior/Discharge Planning: Goal: Ability to manage health-related needs will improve Outcome: Not Progressing   Problem: Activity: Goal: Risk for activity intolerance will decrease Outcome: Not Progressing   Problem: Coping: Goal: Level of anxiety will decrease Outcome: Not Progressing   Problem: Skin Integrity: Goal: Risk for impaired skin integrity will decrease Outcome: Not Progressing

## 2020-04-17 NOTE — Progress Notes (Signed)
Patient ID: Shane Banks, male   DOB: [birth year], 74 y.o.   MRN: [id number]  [facility] Progress Note   Assessment/ Plan:   1.  Acute on chronic hypoxic respiratory failure: Status post tracheostomy with history of recurrent HCAP. Due to renal function would recommend avycas be dosed every 48 hours if acceptable to ordering team  2. ESRD: We will continue with hemodialysis a Monday/Wednesday/Friday schedule.  Given his current deconditioning and nutritional needs as well as ongoing tracheostomy care requirements; he is physically not  suitable for outpatient hemodialysis placement at this time.  Increase UF with HD.  Renal panel in AM to ensure Na improves.   3. Anemia of CKD: Likely secondary to underlying end-stage renal disease and compounded by acute illness/inflammatory component.  Continue ESA.  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  Continue to follow off of vitamin D receptor analogs  5. Nutrition: tube feeds due to concerns of recurrent aspiration.  6. Hypertension: Blood pressures within acceptable range, optimize volume with HD   Subjective:   Last HD on 7/19 with 2.5 kg UF.  He is not able to offer additional history given mental status.  Seen and examined on dialysis at 7:56 am.  Blood pressure 124/68 and HR 94.  Tunneled cath RIJ.  Tolerating goal.  Increased goal by 0.5 kg and gave order for albumin to augment pressure to allow extra UF.  Sometimes has hypotension with higher goals per nursing.   Review of systems: unable to obtain 2/2 AMS/patient factors    Objective:   BP 137/69 (BP Location: Left Arm)   Pulse 88   Temp 98.5 F (36.9 C) (Axillary)   Resp (!) 24   Ht 5' 9"  (1.753 m)   Wt 68.2 kg   SpO2 100%   BMI 22.20 kg/m   Physical Exam:   Gen: Chronically ill-appearing and cachectic man with tracheostomy. CVS: S1  and S2 no rub; arms with trace edema bilaterally  Resp: Coarse breath sounds bilaterally, no distinct rales or rhonchi. Abd: Soft, colostomy bag in situ.  Feeding tube in situ Ext: Trace upper extremity and lower extremity edema bilaterally; heel protection in place Neuro - sleeping on arrival; does not follow simple motor commands Access: RIJ tunneled catheter  Labs: BMET Recent Labs  Lab 04/10/20 1010 04/11/20 0730 04/12/20 0543 04/15/20 1026 04/17/20 0558  NA 131* 128* 130* 125* 124*  K 3.9 3.7 3.4* 3.7 3.8  CL 96* 94* 98 92* 92*  CO2 26 25 24 24 23   GLUCOSE 131* 117* 106* 117* 151*  BUN 91* 116* 57* 107* 101*  CREATININE 2.73* 2.99* 1.87* 2.54* 2.26*  CALCIUM 9.3 9.3 9.2 9.7 9.3  PHOS 4.4 4.6 2.7 3.6 3.4   CBC Recent Labs  Lab 04/11/20 0730 04/12/20 0543 04/15/20 1026 04/17/20 0558  WBC 13.7* 18.3* 13.2* 17.6*  HGB 8.4* 9.4* 8.9* 8.8*  HCT 28.0* 30.4* 28.5* 28.6*  MCV 80.5 79.8* 76.0* 75.9*  PLT 280 302 283 285      Medications:    . apixaban  2.5 mg Per Tube BID  . chlorhexidine  15 mL Mouth Rinse BID  . chlorhexidine gluconate (MEDLINE KIT)  15 mL Mouth Rinse BID  . Chlorhexidine Gluconate Cloth  6 each Topical Q0600  . Chlorhexidine Gluconate Cloth  6 each Topical Q0600  . cholestyramine  4 g Per Tube BID  . [  START ON 04/22/2020] darbepoetin (ARANESP) injection - DIALYSIS  150 mcg Intravenous Q Mon-HD  . feeding supplement (PROSource TF)  45 mL Per Tube TID  . insulin aspart  0-6 Units Subcutaneous Q4H  . insulin aspart  2 Units Subcutaneous Q4H  . levothyroxine  25 mcg Per Tube Q0600  . lipase/protease/amylase)  20,880 Units Per Tube Once   And  . sodium bicarbonate  650 mg Per Tube Once  . mouth rinse  15 mL Mouth Rinse q12n4p  . metoprolol tartrate  25 mg Per Tube BID  . nutrition supplement (JUVEN)  1 packet Per Tube BID BM  . pantoprazole sodium  40 mg Per Tube Daily  . phenylephrine 200 mcg / ml (NON-ED USE ONLY) injection  100 mcg  Intracavernosal Once   Claudia Desanctis, MD 04/17/2020, 8:01 AM

## 2020-04-17 NOTE — TOC Progression Note (Signed)
Transition of Care [facility]) - Progression Note    Patient Details  Name: Shane Banks MRN: [id number] Date of Birth: 1946/06/13  Transition of Care [facility] CM/SW Contact  Angelita Ingles, RN Phone Number: [phone]  04/17/2020, 2:08 PM  Clinical Narrative:    Sister still wants more family members to see the patient before making a decision. Sister is contemplating the idea of bringing the patient home. [facility] may be able to accept patient however transportation will be an issue. Hospice will not pay for transportation because they have not yet accepted the patient. Transportation will have to be paid by family.Sister does not want to make any arrangements at this time. TOC will continue to follow.     Expected Discharge Plan: Long Term Acute Care (LTAC) Barriers to Discharge: Continued Medical Work up, SNF Pending bed offer  Expected Discharge Plan and Services Expected Discharge Plan: [place] (LTAC)       Living arrangements for the past 2 months: Post-Acute Facility                                       Social Determinants of Health (SDOH) Interventions    Readmission Risk Interventions No flowsheet data found.

## 2020-04-18 DIAGNOSIS — T827XXD Infection and inflammatory reaction due to other cardiac and vascular devices, implants and grafts, subsequent encounter: Secondary | ICD-10-CM

## 2020-04-18 LAB — RENAL FUNCTION PANEL
Albumin: 1.9 g/dL — ABNORMAL LOW (ref 3.5–5.0)
Anion gap: 8 (ref 5–15)
BUN: 55 mg/dL — ABNORMAL HIGH (ref 8–23)
CO2: 24 mmol/L (ref 22–32)
Calcium: 8.7 mg/dL — ABNORMAL LOW (ref 8.9–10.3)
Chloride: 97 mmol/L — ABNORMAL LOW (ref 98–111)
Creatinine, Ser: 1.61 mg/dL — ABNORMAL HIGH (ref 0.61–1.24)
GFR calc Af Amer: 49 mL/min — ABNORMAL LOW (ref >60)
GFR calc non Af Amer: 42 mL/min — ABNORMAL LOW (ref >60)
Glucose, Bld: 138 mg/dL — ABNORMAL HIGH (ref 70–99)
Phosphorus: 2.3 mg/dL — ABNORMAL LOW (ref 2.5–4.6)
Potassium: 3.4 mmol/L — ABNORMAL LOW (ref 3.5–5.1)
Sodium: 129 mmol/L — ABNORMAL LOW (ref 135–145)

## 2020-04-18 LAB — GLUCOSE, CAPILLARY
Glucose-Capillary: 125 mg/dL — ABNORMAL HIGH (ref 70–99)
Glucose-Capillary: 123 mg/dL — ABNORMAL HIGH (ref 70–99)
Glucose-Capillary: 135 mg/dL — ABNORMAL HIGH (ref 70–99)
Glucose-Capillary: 112 mg/dL — ABNORMAL HIGH (ref 70–99)
Glucose-Capillary: 136 mg/dL — ABNORMAL HIGH (ref 70–99)

## 2020-04-18 MED ORDER — POTASSIUM CHLORIDE CRYS ER 20 MEQ PO TBCR
20.0000 meq | EXTENDED_RELEASE_TABLET | Freq: Once | ORAL | Status: AC
  Administered 2020-04-18: 20 meq via ORAL
  Filled 2020-04-18: qty 1

## 2020-04-18 NOTE — Progress Notes (Signed)
SLP Cancellation Note  Patient Details Name: Shane Banks MRN: [id number] DOB: 1945-10-13   Cancelled treatment:    PMSV treatment   Reason Eval/Treat Not Completed: Other (comment)  Family/friend present politely requesting time alone with pt. Asked for SLP to come back later if possible. Will continue efforts.  Rajendra Spiller P. Demika Langenderfer, M.S., CCC-SLP Speech-Language Pathologist Acute Rehabilitation Services Pager: [phone] 04/18/2020, 2:31 PM

## 2020-04-18 NOTE — Progress Notes (Addendum)
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  Patient was treated for Covid pneumonia in January 2021 and has been on vent because of progressive ARDS.  Glucose record progression he underwent tracheostomy and was discharged to [facility] in February 2021.  He was sent back to [facility] in May due to trach site bleeding suspected hemoptysis.  Also noted to have sacral decubitus at time of presentation in May.  Since admission he has been treated for recurrent multidrug-resistant Pseudomonas pneumonia and MRSA and enterococcal bacteremia.  On 03/15/2020 patient returned to ICU for ventilator and pressor support, he will septic shock and acute kidney injury requiring dialysis.  Sepsis physiology was thought to be due to sacral decubitus ulcer with underlying osteomyelitis.  Patient was weaned off pressors.  PCCM is following for trach weaning with assistance of speech therapy.  Patient is related from ventilator on 03/21/2020 and was transferred to the floor on 03/22/2020.  Due to lack of insurance approval patient can go back to select hospital.  Patient was seen by nephrology and deemed not a candidate for outpatient dialysis.  Patient has already progressed in mobility and PT/ OT has signed off.  On the morning of 7/6 patient developed hypoxemia and increased work of breathing with shallow respiratory effort, requiring frequent suctioning.  O2 sats decreased into the 70s but improved to 95% with bagging and suctioning.  Briefly required increase of FiO2 to 100% but was eventually decreased back to 40%.  Tracheal  aspirate appeared consistent with tube feedings therefore tube feedings were stopped.  He was found to have multidrug-resistant Pseudomonas once again only sensitive to gent this time.  Concerns that this may be related to colonization with recommendation from ID to follow.  Patient later developed fevers within 24 hours after that recommendation on the evening of 7/9 so at the recommendation of infectious disease service given multidrug-resistant Pseudomonas recommendation was started on Fortaz plus [name].  Patient reevaluated by SLP on 7/14.  PMV was placed - his exhalatory effort was extremely diminished to the point he had difficulty moving air to the upper airway.  Able to demonstrate increased vocal intensity without definitive phonation.  The expiratory muscle strength trainer was used at the lowest setting of 5 cm of water with little effort but accurate.  Is able to complete 8 repetitions but reported dizziness.  7/21: Although sister did initiate conversation with home health agency she still remains conflicted about definite discharge plan and transitioning to comfort measures.  Awaiting other family members to visit patient so they can see how poorly he is doing and hopefully with their encouragement she will be able to move forward with recommended plan to transition to comfort measures  Subjective   Patient seen and examined, somnolent.   Assessment/Plan:     1. Acute on chronic hypoxemic respiratory failure-patient required tracheostomy with prolonged mechanical ventilation, secondary to Covid fibrosis and healthcare associated Pseudomonas pneumonia with complaint of aspiration.  Patient completed a dose of IV meropenem as of 04/05/2020.  Repeat sputum culture obtained due to initiation of meropenem has finalized with few MDR Pseudomonas.  Patient with low-grade fever on 04/05/2020, ID recommended  [name] with [name]  as as of 7/19.  Patient will need total 14 days of antibiotics given  recurrence of MDR Pseudomonas. 2. Hypertrophic cardiomyopathy-echocardiogram on 04/16/2020 showed hyperdynamic EF of 70% with severe asymmetric LV hypertrophy contributing cardiologist confirm diagnosis after chart review of outside records. 3. Diarrhea-likely noninfectious in etiology, C. difficile negative.  Likely from antibiotics and with medication.  Continues to have watery stools per colostomy. 4. Funguria/oral yeast-urine culture grew 30,000 colonies of yeast.  Completed course of Diflucan on 7/1. 5. Sacral osteomyelitis/HD catheter sepsis/enterococcal bacteremia-tunneled cath removal on 02/21/2020.  Has tunneled HD catheter right subclavian.  Completed 6 weeks of IV vancomycin and Flagyl on 04/04/2020 6. Paroxysmal atrial fibrillation and flutter/acquired thrombophilia-rate controlled on metoprolol.  Anticoagulation was discontinued due to GI bleed and resumed on 02/26/2020.  No signs of bleeding. 7. CKD stage V-patient requiring hemodialysis secondary to ATN AKI from IV gentamicin.  Patient has been hemodialysis since March 2021.  S/p tunneled dialysis catheter placement on 621 as of 03/28/2020 site has been unremarkable.  Nephrology is following dialysis MWF.  Patient continues to decline medically and has significant weakness.  Patient is unable to tolerate sitting up in recliner receive outpatient dialysis.  Nephrologist has recommended that patient not a candidate for outpatient HD. 8. History of normocytic CVA-CT head showed atrophy and chronic microvascular disease.  Anticoagulation resumed on 02/16/2020. 9. 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 Severe protein calorie malnutrition-continue tube feeds 11. Goals of care-patient has extremely poor prognosis and sister has been made aware of no meaningful recovery from pulmonary standpoint.  Multiple family members will be visiting the patient over the next few days and then final decision regarding disposition will  be made regarding hospice, residential hospice or home with home health.  Patient to continue dialysis till he is in the hospital.   Scheduled medications:   . apixaban  2.5 mg Per Tube BID  . chlorhexidine  15 mL Mouth Rinse BID  . chlorhexidine gluconate (MEDLINE KIT)  15 mL Mouth Rinse BID  . Chlorhexidine Gluconate Cloth  6 each Topical Q0600  . Chlorhexidine Gluconate Cloth  6 each Topical Q0600  . cholestyramine  4 g Per Tube BID  . [START ON 04/22/2020] darbepoetin (ARANESP) injection - DIALYSIS  150 mcg Intravenous Q Mon-HD  . feeding supplement (PROSource TF)  45 mL Per Tube TID  . insulin aspart  0-6 Units Subcutaneous Q4H  . insulin aspart  2 Units Subcutaneous Q4H  . levothyroxine  25 mcg Per Tube Q0600  . mouth rinse  15 mL Mouth Rinse q12n4p  . metoprolol tartrate  25 mg Per Tube BID  . nutrition supplement (JUVEN)  1 packet Per Tube BID BM  . pantoprazole sodium  40 mg Per Tube Daily     Antibiotics:   Anti-infectives (From admission, onward)   Start     Dose/Rate Route Frequency Ordered Stop   04/12/20 0900  ceftazidime-avibactam (AVYCAZ) 0.94 g in dextrose 5 % 50 mL IVPB  Status:  Discontinued        0.94 g 25 mL/hr over 2 Hours Intravenous Every 24 hours 04/11/20 1225 04/17/20 1034   04/10/20 2100  ceftazidime-avibactam (AVYCAZ) 0.94 g in dextrose 5 % 50 mL IVPB  Status:  Discontinued        0.94 g 25 mL/hr over 2 Hours Intravenous Every 12 hours 04/10/20 2005 04/11/20 1225   04/08/20 1420  ceftazidime-avibactam (AVYCAZ) 0.94 g in dextrose 5 % 50  mL IVPB  Status:  Discontinued        0.94 g 25 mL/hr over 2 Hours Intravenous Daily at bedtime 04/08/20 1420 04/10/20 2005   04/07/20 1800  ceftazidime-avibactam (AVYCAZ) 0.94 g in dextrose 5 % 50 mL IVPB  Status:  Discontinued        0.94 g 25 mL/hr over 2 Hours Intravenous Every 12 hours 04/07/20 1322 04/08/20 1420   04/05/20 1000  fluconazole (DIFLUCAN) 40 MG/ML suspension 100 mg  Status:  Discontinued        "Followed by" Linked Group Details   100 mg Per Tube Daily 04/04/20 2203 04/15/20 1248   04/04/20 2300  fluconazole (DIFLUCAN) 40 MG/ML suspension 200 mg       "Followed by" Linked Group Details   200 mg Per Tube  Once 04/04/20 2203 04/04/20 2325   04/01/20 1013  vancomycin (VANCOCIN) 500-5 MG/100ML-% IVPB       Note to Pharmacy: Cherylann Banas   : cabinet override      04/01/20 1013 04/01/20 1253   03/29/20 1552  vancomycin (VANCOCIN) 500-5 MG/100ML-% IVPB       Note to Pharmacy: Anthonette Legato, Melisa   : cabinet override      03/29/20 1552 03/29/20 1556   03/29/20 1415  fluconazole (DIFLUCAN) 40 MG/ML suspension 100 mg        100 mg Oral Daily 03/29/20 1408 03/31/20 1015   03/28/20 2000  meropenem (MERREM) 500 mg in sodium chloride 0.9 % 100 mL IVPB        500 mg 200 mL/hr over 30 Minutes Intravenous Every 24 hours 03/28/20 1623 04/04/20 2201   03/25/20 1800  cefTAZidime (FORTAZ) 1 g in sodium chloride 0.9 % 100 mL IVPB  Status:  Discontinued       "Followed by" Linked Group Details   1 g 200 mL/hr over 30 Minutes Intravenous Every 24 hours 03/18/20 1015 03/28/20 1613   03/22/20 0830  vancomycin (VANCOREADY) IVPB 500 mg/100 mL        500 mg 100 mL/hr over 60 Minutes Intravenous  Once 03/22/20 0827 03/22/20 1014   03/20/20 1200  vancomycin (VANCOCIN) IVPB 500 mg/100 ml premix        500 mg 100 mL/hr over 60 Minutes Intravenous Every M-W-F (Hemodialysis) 03/19/20 0941 04/03/20 1500   03/18/20 1800  meropenem (MERREM) 500 mg in sodium chloride 0.9 % 100 mL IVPB       "Followed by" Linked Group Details   500 mg 200 mL/hr over 30 Minutes Intravenous Every 24 hours 03/18/20 1015 03/25/20 0026   03/18/20 1200  vancomycin (VANCOCIN) IVPB 500 mg/100 ml premix        500 mg 100 mL/hr over 60 Minutes Intravenous Every M-W-F (Hemodialysis) 03/18/20 0938 03/18/20 1700   03/18/20 1200  cefTAZidime (FORTAZ) 2 g in sodium chloride 0.9 % 100 mL IVPB  Status:  Discontinued        2 g 200 mL/hr over 30  Minutes Intravenous Every Mon (Hemodialysis) 03/18/20 0939 03/18/20 1015   03/07/20 1800  cefTAZidime (FORTAZ) 2 g in sodium chloride 0.9 % 100 mL IVPB  Status:  Discontinued        2 g 200 mL/hr over 30 Minutes Intravenous Every T-Th-Sa (1800) 03/06/20 1255 03/18/20 1015   03/07/20 1200  vancomycin (VANCOCIN) IVPB 500 mg/100 ml premix  Status:  Discontinued        500 mg 100 mL/hr over 60 Minutes Intravenous Every T-Th-Sa (Hemodialysis) 03/06/20 1255 03/19/20 0941  03/04/20 2000  cefTAZidime (FORTAZ) 2 g in sodium chloride 0.9 % 100 mL IVPB  Status:  Discontinued        2 g 200 mL/hr over 30 Minutes Intravenous Every M-W-F (2000) 03/01/20 1136 03/06/20 1255   03/02/20 1200  vancomycin (VANCOCIN) IVPB 500 mg/100 ml premix        500 mg 100 mL/hr over 60 Minutes Intravenous Every T-Th-Sa (Hemodialysis) 03/01/20 1131 03/02/20 1710   03/01/20 1230  vancomycin (VANCOCIN) IVPB 500 mg/100 ml premix  Status:  Discontinued        500 mg 100 mL/hr over 60 Minutes Intravenous Every M-W-F (Hemodialysis) 03/01/20 1131 03/06/20 1255   02/28/20 1110  vancomycin (VANCOREADY) IVPB 500 mg/100 mL        over 60 Minutes  Continuous PRN 02/28/20 1112 02/28/20 1110   02/28/20 1101  vancomycin (VANCOCIN) 1-5 GM/200ML-% IVPB  Status:  Discontinued       Note to Pharmacy: Lytle Butte   : cabinet override      02/28/20 1101 02/28/20 1327   02/27/20 1800  cefTAZidime (FORTAZ) 1 g in sodium chloride 0.9 % 100 mL IVPB        1 g 200 mL/hr over 30 Minutes Intravenous Every 24 hours 02/27/20 0947 03/03/20 1738   02/27/20 1200  vancomycin (VANCOCIN) IVPB 500 mg/100 ml premix        500 mg 100 mL/hr over 60 Minutes Intravenous Once 02/27/20 0829 02/27/20 1343   02/24/20 1800  vancomycin (VANCOREADY) IVPB 500 mg/100 mL        500 mg 100 mL/hr over 60 Minutes Intravenous  Once 02/24/20 1308 02/24/20 1905   02/22/20 2200  metroNIDAZOLE (FLAGYL) tablet 500 mg  Status:  Discontinued        500 mg Per Tube Every 8  hours 02/22/20 0957 03/30/20 1057   02/22/20 1700  cefTRIAXone (ROCEPHIN) 2 g in sodium chloride 0.9 % 100 mL IVPB  Status:  Discontinued        2 g 200 mL/hr over 30 Minutes Intravenous Every 24 hours 02/22/20 0957 02/27/20 0947   02/22/20 1200  vancomycin (VANCOCIN) IVPB 500 mg/100 ml premix  Status:  Discontinued        500 mg 100 mL/hr over 60 Minutes Intravenous Every T-Th-Sa (Hemodialysis) 02/20/20 1253 02/21/20 1040   02/21/20 1431  vancomycin variable dose per unstable renal function (pharmacist dosing)  Status:  Discontinued         Does not apply See admin instructions 02/21/20 1431 03/01/20 1131   02/21/20 1200  vancomycin (VANCOREADY) IVPB 500 mg/100 mL        500 mg 100 mL/hr over 60 Minutes Intravenous  Once 02/21/20 0956 02/21/20 1253   02/21/20 0714  vancomycin (VANCOCIN) 500-5 MG/100ML-% IVPB  Status:  Discontinued       Note to Pharmacy: Ashley Akin   : cabinet override      02/21/20 0714 02/21/20 1346   02/20/20 1400  vancomycin (VANCOREADY) IVPB 1250 mg/250 mL        1,250 mg 166.7 mL/hr over 90 Minutes Intravenous  Once 02/20/20 1253 02/20/20 1538   02/20/20 1200  levofloxacin (LEVAQUIN) IVPB 500 mg  Status:  Discontinued        500 mg 100 mL/hr over 60 Minutes Intravenous Every 48 hours 02/20/20 1020 02/21/20 0943   02/15/20 1545  vancomycin (VANCOCIN) IVPB 1000 mg/200 mL premix  Status:  Discontinued       "Followed by" Linked Group Details  1,000 mg 200 mL/hr over 60 Minutes Intravenous Every 24 hours 02/14/20 1530 02/14/20 1745   02/15/20 0330  ceFEPIme (MAXIPIME) 2 g in sodium chloride 0.9 % 100 mL IVPB  Status:  Discontinued        2 g 200 mL/hr over 30 Minutes Intravenous Every 12 hours 02/14/20 1531 02/14/20 1531   02/14/20 1531  ceFEPIme (MAXIPIME) 2 g in sodium chloride 0.9 % 100 mL IVPB  Status:  Discontinued        2 g 200 mL/hr over 30 Minutes Intravenous Every 12 hours 02/14/20 1531 02/14/20 1745   02/14/20 1530  ceFEPIme (MAXIPIME) 2 g in sodium  chloride 0.9 % 100 mL IVPB  Status:  Discontinued        2 g 200 mL/hr over 30 Minutes Intravenous  Once 02/14/20 1520 02/14/20 1531   02/14/20 1530  vancomycin (VANCOREADY) IVPB 1500 mg/300 mL  Status:  Discontinued       "Followed by" Linked Group Details   1,500 mg 150 mL/hr over 120 Minutes Intravenous  Once 02/14/20 1530 02/14/20 1745      CBG: Recent Labs  Lab 04/17/20 1638 04/17/20 1937 04/17/20 2338 04/18/20 0356 04/18/20 0805  GLUCAP 147* 122* 168* 125* 123*    SpO2: 97 % O2 Flow Rate (L/min): 5 L/min FiO2 (%): 28 %    CBC: Recent Labs  Lab 04/12/20 0543 04/15/20 1026 04/17/20 0558  WBC 18.3* 13.2* 17.6*  HGB 9.4* 8.9* 8.8*  HCT 30.4* 28.5* 28.6*  MCV 79.8* 76.0* 75.9*  PLT 302 283 594    Basic Metabolic Panel: Recent Labs  Lab 04/12/20 0543 04/15/20 1026 04/17/20 0558 04/18/20 0245  NA 130* 125* 124* 129*  K 3.4* 3.7 3.8 3.4*  CL 98 92* 92* 97*  CO2 24 24 23 24   GLUCOSE 106* 117* 151* 138*  BUN 57* 107* 101* 55*  CREATININE 1.87* 2.54* 2.26* 1.61*  CALCIUM 9.2 9.7 9.3 8.7*  PHOS 2.7 3.6 3.4 2.3*     Liver Function Tests: Recent Labs  Lab 04/12/20 0543 04/15/20 1026 04/17/20 0558 04/18/20 0245  ALBUMIN 2.0* 1.8* 1.8* 1.9*      DVT prophylaxis: Eliquis  Code Status: Full code  Family Communication: No family at bedside    Status is: Inpatient  Dispo: The patient is from: LTAC              Anticipated d/c is to: Hospice.  home versus residential hospice              Anticipated d/c date is: 04/22/2020              Patient currently awaiting Perative care discussion with family regarding different goals of care  Barrier to discharge-unclear disposition, will await final palliative care recommendations  Pressure Injury 02/14/20 Sacrum Medial Stage 4 - Full thickness tissue loss with exposed bone, tendon or muscle. (Active)  02/14/20 1906  Location: Sacrum  Location Orientation: Medial  Staging: Stage 4 - Full thickness  tissue loss with exposed bone, tendon or muscle.  Wound Description (Comments):   Present on Admission: Yes     Pressure Injury 04/11/20 Buttocks Left;Lower Stage 3 -  Full thickness tissue loss. Subcutaneous fat may be visible but bone, tendon or muscle are NOT exposed. (Active)  04/11/20   Location: Buttocks  Location Orientation: Left;Lower  Staging: Stage 3 -  Full thickness tissue loss. Subcutaneous fat may be visible but bone, tendon or muscle are NOT exposed.  Wound Description (Comments):  Present on Admission:      Pressure Injury 04/11/20 Buttocks Right;Lower Stage 2 -  Partial thickness loss of dermis presenting as a shallow open injury with a red, pink wound bed without slough. (Active)  04/11/20   Location: Buttocks  Location Orientation: Right;Lower  Staging: Stage 2 -  Partial thickness loss of dermis presenting as a shallow open injury with a red, pink wound bed without slough.  Wound Description (Comments):   Present on Admission:       Objective   Vitals:   04/18/20 0356 04/18/20 0806 04/18/20 0809 04/18/20 1122  BP:   127/77 114/65  Pulse: 78 92 88 81  Resp:   (!) 26 (!) 28  Temp: 99 F (37.2 C) 98.3 F (36.8 C)    TempSrc: Axillary Axillary    SpO2:   95% 97%  Weight:      Height:        Intake/Output Summary (Last 24 hours) at 04/18/2020 1206 Last data filed at 04/17/2020 1646 Gross per 24 hour  Intake --  Output 400 ml  Net -400 ml    07/20 1901 - 07/22 0700 In: 1020 [I.V.:300] Out: 2900 [Urine:600]  Filed Weights   04/17/20 0426 04/17/20 0730 04/17/20 1113  Weight: 68.2 kg 68.2 kg 66.2 kg    Physical Examination:    General: Appears somnolent, tracheostomy in place  Cardiovascular: S1-S2, regular, no murmur auscultated  Respiratory: Clear to auscultation bilaterally  Abdomen: Abdomen soft, nontender, no organomegaly, PEG tube, colostomy bag noted  Extremities: No edema in the lower extremities  Neurologic: Somnolent, barely  opens eyes to verbal stimuli    Data Reviewed:    Studies:  DG CHEST PORT 1 VIEW  Result Date: 04/16/2020 CLINICAL DATA:  Short of breath, congestion, bleeding around tracheostomy site EXAM: PORTABLE CHEST 1 VIEW COMPARISON:  04/07/2020 FINDINGS: Single frontal view of the chest demonstrates stable tracheostomy tube and right internal jugular dialysis catheter. The cardiac silhouette is unchanged. There is persistent consolidation and/or effusion at the left lung base. Stable interstitial prominence at the lung bases. No pneumothorax. IMPRESSION: 1. Stable support devices. 2. Persistent left basilar consolidation and/or effusion. Electronically Signed   By: Randa Ngo M.D.   On: 04/16/2020 19:26   ECHOCARDIOGRAM COMPLETE  Result Date: 04/16/2020    ECHOCARDIOGRAM REPORT   Patient Name:   Shane Banks Date of Exam: 04/16/2020 Medical Rec #:  [id number]    Height:       69.0 in Accession #:    [id number]   Weight:       146.6 lb Date of Birth:  09-11-1946    BSA:          1.811 m Patient Age:    50 years     BP:           137/69 mmHg Patient Gender: M            HR:           86 bpm. Exam Location:  Inpatient Procedure: 2D Echo, Cardiac Doppler and Color Doppler Indications:    R01.1 Murmur  History:        Patient has prior history of Echocardiogram examinations, most                 recent 02/12/2020. Arrythmias:Atrial Fibrillation;                 Signs/Symptoms:Fever, Bacteremia and Dizziness/Lightheadedness.  History of Covid 19 infection and pneumonia. Respiratory                 failure.  Sonographer:    Roseanna Rainbow RDCS Referring Phys: [id number] Samella Parr  Sonographer Comments: Technically difficult study due to poor echo windows. Patient has trach collar. Apical images extremely lateral. Could not turn patient. IMPRESSIONS  1. There is severe asymmetric hypertrophy of the septum up to 2.0 cm. There is a mid cavitary gradient up to 10 mmHG due to cavity obliteration in systole.  There is no SAM of the mitral valve. Findings are concerning for hypertrophic cardiomyopathy, reverse curvature type. Per review of care everywhere, the patient was last seen by cardiology on 07/20/2017 at [facility]) and this is a well-known diagnosis. Left ventricular ejection fraction, by estimation, is 70 to 75%. The left ventricle has hyperdynamic function. The left ventricle has no regional wall motion abnormalities. There is severe asymmetric left ventricular hypertrophy of the septal segment. Left ventricular diastolic function could not be evaluated.  2. Right ventricular systolic function is mildly reduced. The right ventricular size is normal. Tricuspid regurgitation signal is inadequate for assessing PA pressure.  3. The mitral valve is degenerative. No evidence of mitral valve regurgitation. No evidence of mitral stenosis.  4. The aortic valve is tricuspid. Aortic valve regurgitation is not visualized. No aortic stenosis is present.  5. The inferior vena cava is normal in size with greater than 50% respiratory variability, suggesting right atrial pressure of 3 mmHg. Comparison(s): Changes from prior study are noted. Atrial flutter is now present. Hypertrophic cardiomyopathy is still present. Conclusion(s)/Recommendation(s): Findings consistent with hypertrophic cardiomyopathy. FINDINGS  Left Ventricle: There is severe asymmetric hypertrophy of the septum up to 2.0 cm. There is a mid cavitary gradient up to 10 mmHG due to cavity obliteration in systole. There is no SAM of the mitral valve. Findings are concerning for hypertrophic cardiomyopathy, reverse curvature type. Per review of care everywhere, the patient was last seen by cardiology on 07/20/2017 at [facility] [place]) and this is a well-known diagnosis. Left ventricular ejection fraction, by estimation, is 70 to 75%. The left ventricle has hyperdynamic function. The left ventricle has no regional wall motion  abnormalities. The left ventricular internal cavity size was normal in size. There is severe asymmetric left ventricular hypertrophy of the septal segment. Left ventricular diastolic function could not be evaluated due to atrial fibrillation. Left ventricular diastolic function could not be evaluated. Right Ventricle: The right ventricular size is normal. No increase in right ventricular wall thickness. Right ventricular systolic function is mildly reduced. Tricuspid regurgitation signal is inadequate for assessing PA pressure. Left Atrium: Left atrial size was normal in size. Right Atrium: Right atrial size was normal in size. Pericardium: Trivial pericardial effusion is present. Mitral Valve: The mitral valve is degenerative in appearance. Mild mitral annular calcification. No evidence of mitral valve regurgitation. No evidence of mitral valve stenosis. Tricuspid Valve: The tricuspid valve is grossly normal. Tricuspid valve regurgitation is mild . No evidence of tricuspid stenosis. Aortic Valve: The aortic valve is tricuspid. Aortic valve regurgitation is not visualized. No aortic stenosis is present. Pulmonic Valve: The pulmonic valve was grossly normal. Pulmonic valve regurgitation is not visualized. No evidence of pulmonic stenosis. Aorta: The aortic root is normal in size and structure. Venous: The inferior vena cava is normal in size with greater than 50% respiratory variability, suggesting right atrial pressure of 3 mmHg. IAS/Shunts: The atrial septum is  grossly normal.  LEFT VENTRICLE PLAX 2D LVIDd:         3.60 cm     Diastology LVIDs:         2.20 cm     LV e' lateral:   12.60 cm/s LV PW:         1.40 cm     LV E/e' lateral: 6.9 LV IVS:        2.00 cm     LV e' medial:    6.96 cm/s LVOT diam:     1.80 cm     LV E/e' medial:  12.5 LV SV:         40 LV SV Index:   22 LVOT Area:     2.54 cm  LV Volumes (MOD) LV vol d, MOD A2C: 49.8 ml LV vol d, MOD A4C: 38.4 ml LV vol s, MOD A2C: 11.3 ml LV vol s, MOD A4C:  11.3 ml LV SV MOD A2C:     38.5 ml LV SV MOD A4C:     38.4 ml LV SV MOD BP:      31.4 ml RIGHT VENTRICLE            IVC RV S prime:     9.79 cm/s  IVC diam: 2.00 cm TAPSE (M-mode): 1.6 cm LEFT ATRIUM             Index       RIGHT ATRIUM           Index LA diam:        4.90 cm 2.71 cm/m  RA Area:     14.50 cm LA Vol (A2C):   60.1 ml 33.19 ml/m RA Volume:   33.70 ml  18.61 ml/m LA Vol (A4C):   17.2 ml 9.50 ml/m LA Biplane Vol: 33.1 ml 18.28 ml/m  AORTIC VALVE LVOT Vmax:   108.00 cm/s LVOT Vmean:  79.800 cm/s LVOT VTI:    0.157 m  AORTA Ao Root diam: 4.10 cm MITRAL VALVE MV Area (PHT): 4.15 cm    SHUNTS MV Decel Time: 183 msec    Systemic VTI:  0.16 m MV E velocity: 86.83 cm/s  Systemic Diam: 1.80 cm Shane Chiquito MD Electronically signed by Shane Chiquito MD Signature Date/Time: 04/16/2020/6:30:28 PM    Final        Oswald Hillock   [facility] If 7PM-7AM, please contact night-coverage at [email or web address], Office  [phone]   04/18/2020, 12:06 PM  LOS: 64 days

## 2020-04-18 NOTE — Progress Notes (Signed)
Patient ID: Shane Banks, male   DOB: [birth year], 74 y.o.   MRN: [id number]  [facility] Progress Note   Assessment/ Plan:   1.  Acute on chronic hypoxic respiratory failure: Status post tracheostomy with history of recurrent HCAP. Abx per primary, renal dosing per pharm.  2. ESRD: We will continue with hemodialysis a Monday/Wednesday/Friday schedule.  Given his current deconditioning and nutritional needs as well as ongoing tracheostomy care requirements; he is physically not  suitable for outpatient hemodialysis placement at this time.  Increase UF with HD given hyponatremia; improved but still 129.  Limit free water -- don't see order for FWF but d/c if I missed it.    3. Anemia of CKD: Likely secondary to underlying end-stage renal disease and compounded by acute illness/inflammatory component.  Continue ESA.  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  Continue to follow off of vitamin D receptor analogs  5. Nutrition: tube feeds due to concerns of recurrent aspiration.  6. Hypertension: Blood pressures within acceptable range, optimize volume with HD   Dispo: noted discussion with family re: possible transition of care which I agree is appropriate.    Subjective:    He is not able to offer additional history given mental status.  Noted discussions regarding EoL care and disposition.   Review of systems: unable to obtain 2/2 AMS/patient factors    Objective:   BP 127/77   Pulse 88   Temp 98.3 F (36.8 C) (Axillary)   Resp (!) 26   Ht 5' 9"  (1.753 m)   Wt 66.2 kg   SpO2 95%   BMI 21.55 kg/m   Physical Exam:   Gen: Chronically ill-appearing and cachectic man with tracheostomy.  CVS: S1 and S2 no rub; arms with trace edema bilaterally  Resp: Coarse breath sounds bilaterally, no distinct rales or rhonchi.  Tachypnea noted unchanged Abd:  Soft, colostomy bag in situ.  Feeding tube in situ Ext: Trace upper extremity and lower extremity edema bilaterally; heel protection in place Neuro - awake;  does not follow simple motor commands Access: RIJ tunneled catheter  Labs: BMET Recent Labs  Lab 04/12/20 0543 04/15/20 1026 04/17/20 0558 04/18/20 0245  NA 130* 125* 124* 129*  K 3.4* 3.7 3.8 3.4*  CL 98 92* 92* 97*  CO2 24 24 23 24   GLUCOSE 106* 117* 151* 138*  BUN 57* 107* 101* 55*  CREATININE 1.87* 2.54* 2.26* 1.61*  CALCIUM 9.2 9.7 9.3 8.7*  PHOS 2.7 3.6 3.4 2.3*   CBC Recent Labs  Lab 04/12/20 0543 04/15/20 1026 04/17/20 0558  WBC 18.3* 13.2* 17.6*  HGB 9.4* 8.9* 8.8*  HCT 30.4* 28.5* 28.6*  MCV 79.8* 76.0* 75.9*  PLT 302 283 285      Medications:    . apixaban  2.5 mg Per Tube BID  . chlorhexidine  15 mL Mouth Rinse BID  . chlorhexidine gluconate (MEDLINE KIT)  15 mL Mouth Rinse BID  . Chlorhexidine Gluconate Cloth  6 each Topical Q0600  . Chlorhexidine Gluconate Cloth  6 each Topical Q0600  . cholestyramine  4 g Per Tube BID  . [START ON 04/22/2020] darbepoetin (ARANESP) injection - DIALYSIS  150 mcg Intravenous Q Mon-HD  . feeding supplement (PROSource TF)  45 mL Per Tube TID  . insulin aspart  0-6 Units Subcutaneous Q4H  . insulin aspart  2 Units Subcutaneous Q4H  .  levothyroxine  25 mcg Per Tube Q0600  . mouth rinse  15 mL Mouth Rinse q12n4p  . metoprolol tartrate  25 mg Per Tube BID  . nutrition supplement (JUVEN)  1 packet Per Tube BID BM  . pantoprazole sodium  40 mg Per Tube Daily   Justin Mend, MD 04/18/2020, 10:12 AM

## 2020-04-19 LAB — GLUCOSE, CAPILLARY
Glucose-Capillary: 115 mg/dL — ABNORMAL HIGH (ref 70–99)
Glucose-Capillary: 136 mg/dL — ABNORMAL HIGH (ref 70–99)
Glucose-Capillary: 137 mg/dL — ABNORMAL HIGH (ref 70–99)
Glucose-Capillary: 159 mg/dL — ABNORMAL HIGH (ref 70–99)
Glucose-Capillary: 81 mg/dL (ref 70–99)
Glucose-Capillary: 92 mg/dL (ref 70–99)

## 2020-04-19 LAB — CBC
HCT: 27.8 % — ABNORMAL LOW (ref 39.0–52.0)
Hemoglobin: 8.3 g/dL — ABNORMAL LOW (ref 13.0–17.0)
MCH: 23.4 pg — ABNORMAL LOW (ref 26.0–34.0)
MCHC: 29.9 g/dL — ABNORMAL LOW (ref 30.0–36.0)
MCV: 78.3 fL — ABNORMAL LOW (ref 80.0–100.0)
Platelets: 290 10*3/uL (ref 150–400)
RBC: 3.55 MIL/uL — ABNORMAL LOW (ref 4.22–5.81)
RDW: 17.5 % — ABNORMAL HIGH (ref 11.5–15.5)
WBC: 16.9 10*3/uL — ABNORMAL HIGH (ref 4.0–10.5)
nRBC: 0 % (ref 0.0–0.2)

## 2020-04-19 LAB — RENAL FUNCTION PANEL
Albumin: 1.7 g/dL — ABNORMAL LOW (ref 3.5–5.0)
Anion gap: 7 (ref 5–15)
BUN: 95 mg/dL — ABNORMAL HIGH (ref 8–23)
CO2: 26 mmol/L (ref 22–32)
Calcium: 9 mg/dL (ref 8.9–10.3)
Chloride: 93 mmol/L — ABNORMAL LOW (ref 98–111)
Creatinine, Ser: 2.22 mg/dL — ABNORMAL HIGH (ref 0.61–1.24)
GFR calc Af Amer: 33 mL/min — ABNORMAL LOW (ref >60)
GFR calc non Af Amer: 29 mL/min — ABNORMAL LOW (ref >60)
Glucose, Bld: 142 mg/dL — ABNORMAL HIGH (ref 70–99)
Phosphorus: 3.1 mg/dL (ref 2.5–4.6)
Potassium: 3.6 mmol/L (ref 3.5–5.1)
Sodium: 126 mmol/L — ABNORMAL LOW (ref 135–145)

## 2020-04-19 MED ORDER — HEPARIN SODIUM (PORCINE) 1000 UNIT/ML IJ SOLN
INTRAMUSCULAR | Status: AC
  Administered 2020-04-19: 3800 [IU]
  Filled 2020-04-19: qty 4

## 2020-04-19 NOTE — Progress Notes (Addendum)
SLP Cancellation Note  Patient Details Name: Shane Banks MRN: [id number] DOB: [birth year]   Cancelled treatment:       Reason Eval/Treat Not Completed: Other (comment). Given no expectation of meaningful recovery and no progress made through therapy interventions recently, will sign off at this time. Consider comfort feeding if arousal is appropriate.   [name], MA CCC-SLP  Acute Rehabilitation Services Pager [phone] Office [phone]   [name] 04/19/2020, 8:09 AM

## 2020-04-19 NOTE — Progress Notes (Signed)
[facility] PROGRESS NOTE  Shane Banks CZY:[id number] DOB: April 11, 1946 DOA: 02/14/2020 PCP: [name], MD  Status: Inpatient--Remains inpatient appropriate because :Unsafe d/c plan, IV treatments appropriate due to intensity of illness or inability to take PO and Inpatient level of care appropriate due to severity of illness-patient continues to have tenuous respiratory status   Dispo:  Patient From:  Home  Planned Disposition:    Expected discharge date:    Medically stable for discharge: No-needs to transition to cuffless trach (respiratory status too tenuous to accomplish), needs to be able to sit upright in recliner to pursue dialysis in the outpatient setting (profoundly physically deconditioned and unable to even lift arms at this juncture),   **7/20; spoke with [name].  In addition to patient's children who are planning to visit their estranged father on 7/21 [name] also relates that there are multiple other family members who really need to make contact with this patient prior to decision being made.  This would delay any decision making on Wednesday.  I reassured [name] that this is okay.  I did ask her regarding her decision to contact home health and was this in reference to trying to bring the patient home to die and she replied "yes".  She also let me know patient also has Medicaid in addition to [employer] and [employer] supplement.  Discussed with her that may have difficulty paying for in-home care but assured her we would explore all options including hospice, residential hospice and of course home health.  Also briefly discussed death and dying in the dialysis patient and reassured her that we would continue dialysis right up until transport to ensure patient comfort and stability for transport.  Code Status: Full Family Communication: [name] sister 7/14 and 04/11/20-VM left 7/23 re: any further family visits planned before decision made about comfort  measures, brother [name] on 04/02/20; DVT prophylaxis: Eliquis  HPI: 74 y.o. 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 Patient treated for Covid pneumonia January 2021 and had been on the vent because of progressive ARDS.  Because of lack of progression he underwent tracheostomy and was [facility].  Was sent back to this facility in May due to trach site bleeding suspected hemoptysis.  He was also noted with sacral decubitus at time of presentation in May.  Since admission he has been treated for recurrent multidrug-resistant Pseudomonas pneumonia and MRSA and enterococcal bacteremia.  On 03/15/2020 patient returned to the ICU for ventilatory and pressor support he developed septic shock and developed acute renal failure requiring dialysis.  He developed sepsis physiology likely secondary to sacral decubitus ulcer with underlying osteomyelitis.    He has been weaned from pressors. PCCM is following for trach weaning with the assistance of SLP.  Patient was liberated from the vent on 03/21/2020 and was transferred to the floor in 03/22/2020.  Due to the lack of insurance approval he cannot go back to [facility].  Social worker is working on Clinical research associate.  He will need to be transition to a cuffless trach which unfortunately has not been accomplished due to recurrent [name] with respiratory failure physiology.  He has been deemed to not be a candidate for outpatient hemodialysis by the nephrologist.  Patient has not made  any progress in mobility OT and PT have signed off.  Several weeks ago he had issues regarding aspiration pneumonitis on top of multidrug-resistant  Pseudomonas pneumonia that has been treated with [name]  and [name].  He continues to remain quite weak and is unable to lift arms off the bed and has bilateral upper extremity contractures limiting mobility as well.  SLP has been attempting to utilize PMV but patient exquisitely weak and having difficulty tolerating as well.  Ethics physician and myself did have telephonic meeting with patient's sister.  Plans were to allow for children to visit on 7/21 with plans to make a decision about comfort measures after that.  On 7/21 sister asked for additional time to allow other family members to visit with the patient.  Although sister did initiate conversation with home health agency she still remains conflicted about definite discharge plan and transitioning to comfort measures.  Awaiting other family members to visit patient so they can see how poorly he is doing and hopefully with their encouragement she will be able to move forward with recommended plan to transition to comfort measures  Subjective: Awake.  Nodding to questions asked.  Even when not engaged with providers patient repeatedly shakes his head no.  Objective: Vitals:   04/19/20 1215 04/19/20 1219  BP:  (!) 123/63  Pulse: 90 96  Resp: (!) 31 (!) 30  Temp:    SpO2: 97% 96%    Intake/Output Summary (Last 24 hours) at 04/19/2020 1237 Last data filed at 04/19/2020 0700 Gross per 24 hour  Intake 200 ml  Output 750 ml  Net -550 ml   Filed Weights   04/17/20 0426 04/17/20 0730 04/17/20 1113  Weight: 68.2 kg 68.2 kg 66.2 kg    Exam:  Constitutional: NAD, calm and alert today, appears to be mildly cunomfortable along dialysis Neck: midline cuffed trach in place  Respiratory: Bilateral lung sounds coarse on auscultation anteriorly.  Stable, chronic tachypneic but without significant increased work of breathing, trach collar oxygen/FiO2 28% Cardiovascular: Irregular rate with underlying rhythm atrial flutter with controlled  ventricular response, no rubs, murmurs thrills or gallops.  Trace upper extremity edema improving.. 2+ pedal pulses. Dialysis catheter right subclavian vein. Abdomen: no tenderness, no masses palpated. Bowel sounds positive.  Colostomy in place with light brown unformed stool.  Gastrostomy tube with tube feeding GU: Condom catheter in place draining yellow-colored urine to bedside bag Musculoskeletal: no clubbing / cyanosis. No joint deformity upper and lower extremities.  Not spontaneously moving UEs but with PROM has adequate range of motion. Extension contracture involving the right wrist and right elbow.  Also flexion contracture of left hand.  Patient reports discomfort with attempts to extend fingers during PROM.  Diminished muscle tone throughout all extremities and too weak to follow commands with upper extremities/unable to lift extremities upon command  Skin: no rashes, lesions, sacral decubitus not examined Neurologic: CN 2-12 grossly intact. 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  PCCM following periodically to assess readiness for transition cuffless trach. 7/6: Significant respiratory decompensation with FiO2 increased up to 60%. Aspiration and recurrent Pseudomonas pneumonia as etiology. Of note, family refusing further conversations with palliative medicine team.  Excellent telephonic meeting with ethics physician Dr. Andria Frames on 7/14.  Please see his note for details.  Sister made aware of no meaningful overall recovery including from a pulmonary  standpoint. Has completed 8 doses of IV meropenem as of 7/9.   Repeat sputum culture obtained prior to initiation of meropenem has finalized with few MDR Pseudomonas (resistant to imipenem/Carbapenem but sensitive to gentamicin).  Due to mild fever he was started on antibiotics and has completed a course of Fortaz with [name] as of 7/19  Unfortunately given recurrent pneumonia and increased secretions with associated generalized weakness/weak cough it is highly doubtful he can transition to cuffless trach at this point.  Overall prognosis is poor.  Continue chest physiotherapy with vibra vest administered per RT  Goals of care: Ethics team meeting on 7/14 as described above. I will be recontacting patient's Sister [name] today prior to 4 PM to determine if family has reached a decision regarding transitioning patient to comfort care. Please see detailed notes from Dr. Andria Frames dated 7/14 Patient's children will be able to visit with him on Wednesday 7/21.  On that same date sister asked that family be allowed additional time to allow other family members to come by and visit the patient before decisions were made regarding transitioning to comfort care. **Patient sister contacted  [facility]- goal for contacting home health to determine if EOL care would be possible in the home setting.  Sister still remains conflicted but she is aware that we are contacting hospice in the [place] area in the event and EOL home care discharge plan needs to be in place.  Residential hospice also on the table.  TOC documented family would need to pay for ambulance transport to [place]  Hypertrophic cardiomyopathy Patient apparently has had this diagnosis for some time. Echocardiogram completed on 7/20 with hyperdynamic EF of 70% with severe asymmetric LV hypertrophy-interpreting cardiologist confirm diagnosis after chart review of outside records  Diarrhea Noninfectious based on testing Continue cholestyramine twice  daily and maintenance fluids at 30 cc/h  Mild funguria/oral yeast Urine culture with 30,000 colonies of yeast Completed Diflucan as of 7/19  Sacral osteomyelitis and HD catheter sepsis/  Enterococcal bacteremia: Tunneled catheter removal on 02/21/2020. Has tunneled HD catheter right subclavian-side unremarkable Completed 6 weeks of IV vancomycin and Flagyl 7/8  Paroxysmal atrial fibrillation and flutter/Acquired thrombophilia: Currently rate controlled on metoprolol Anticoagulation previously discontinued due to suspected GI bleed and was resumed 5/31 and patient has tolerated it any signs of bleeding.  Chronic kidney disease stage V now requiring hemodialysis (mwf) 2/2 ATN and AKI from IV gentamicin: On HD since March 2021 status post tunneled catheter placement 02/28/2020 and as of 7/1 site unremarkable. Unfortunately, this patient continues to decline, he has significant weakness and is unable to tolerate sitting up in a recliner.  Attending nephrologist has documented that patient is not a candidate for outpatient hemodialysis  Chronic normocytic anemia: Due to anemia of chronic renal disease Status post 1 unit of PRBCs Hgb on 7/19 was 8.9 Continue erythropoietin and IV iron per renal.  History of nonhemorrhagic CVA: CT of the head showed atrophy and chronic microvascular disease. 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 Documented as full-thickness tissue loss  Severe protein caloric malnutrition: Tube feedings on hold due to concerns of a possible aspiration as above.  Patient is dialysis patient but will allow D5 normal saline at 30 cc/h to replace tube feeding volume loss  Will allow medications per tube with small amounts of flush Nutrition Status: Nutrition Problem: Severe Malnutrition Etiology: chronic illness (chronic respiratory failure related to COVID ARDS) Signs/Symptoms: severe  muscle depletion, severe fat depletion Interventions: Prostat, Tube feeding, Juven-continue tube feeding and check residuals every shift       Data Reviewed: Basic Metabolic Panel: Recent Labs  Lab 04/15/20 1026 04/17/20 0558 04/18/20 0245 04/19/20 0822  NA 125* 124* 129* 126*  K 3.7 3.8 3.4* 3.6  CL 92* 92* 97* 93*  CO2 24 23 24 26   GLUCOSE 117* 151* 138* 142*  BUN 107* 101* 55* 95*  CREATININE 2.54* 2.26* 1.61* 2.22*  CALCIUM 9.7 9.3 8.7* 9.0  PHOS 3.6 3.4 2.3* 3.1   Liver Function Tests: Recent Labs  Lab 04/15/20 1026 04/17/20 0558 04/18/20 0245 04/19/20 0822  ALBUMIN 1.8* 1.8* 1.9* 1.7*   No results for input(s): LIPASE, AMYLASE in the last 168 hours. No results for input(s): AMMONIA in the last 168 hours. CBC: Recent Labs  Lab 04/15/20 1026 04/17/20 0558 04/19/20 0825  WBC 13.2* 17.6* 16.9*  HGB 8.9* 8.8* 8.3*  HCT 28.5* 28.6* 27.8*  MCV 76.0* 75.9* 78.3*  PLT 283 285 290   Cardiac Enzymes: No results for input(s): CKTOTAL, CKMB, CKMBINDEX, TROPONINI in the last 168 hours. BNP (last 3 results) No results for input(s): BNP in the last 8760 hours.  ProBNP (last 3 results) No results for input(s): PROBNP in the last 8760 hours.  CBG: Recent Labs  Lab 04/18/20 1604 04/18/20 2049 04/19/20 0011 04/19/20 0421 04/19/20 1157  GLUCAP 112* 136* 137* 136* 92    No results found for this or any previous visit (from the past 240 hour(s)).   Studies: No results found.  Scheduled Meds: . apixaban  2.5 mg Per Tube BID  . chlorhexidine  15 mL Mouth Rinse BID  . chlorhexidine gluconate (MEDLINE KIT)  15 mL Mouth Rinse BID  . Chlorhexidine Gluconate Cloth  6 each Topical Q0600  . Chlorhexidine Gluconate Cloth  6 each Topical Q0600  . cholestyramine  4 g Per Tube BID  . [START ON 04/22/2020] darbepoetin (ARANESP) injection - DIALYSIS  150 mcg Intravenous Q Mon-HD  . feeding supplement (PROSource TF)  45 mL Per Tube TID  . insulin aspart  0-6 Units  Subcutaneous Q4H  . insulin aspart  2 Units Subcutaneous Q4H  . levothyroxine  25 mcg Per Tube Q0600  . mouth rinse  15 mL Mouth Rinse q12n4p  . metoprolol tartrate  25 mg Per Tube BID  . nutrition supplement (JUVEN)  1 packet Per Tube BID BM  . pantoprazole sodium  40 mg Per Tube Daily   Continuous Infusions: . dextrose 20 mL/hr at 04/16/20 2232  . feeding supplement (NEPRO CARB STEADY) 1,000 mL (04/19/20 0532)    Principal Problem:   Enterococcal bacteremia Active Problems:   Acute respiratory failure (HCC)   Hemoptysis   Pressure injury of skin   Protein-calorie malnutrition, severe   Decubitus ulcer of sacral region, stage 4 (HCC)   Fever   Palliative care by specialist   Goals of care, counseling/discussion   DNR (do not resuscitate)     Consultants: PCCM Ethics committee/Dr. Andria Frames  Procedures: Echocardiogram 1. Left ventricular ejection fraction, by estimation, is 60 to 65%. The  left ventricle has normal function. The left ventricle has no regional  wall motion abnormalities. There is severe left ventricular hypertrophy.  Left ventricular diastolic parameters  are consistent with Grade I diastolic dysfunction (impaired  relaxation).  2. Right ventricular systolic function is normal. The right ventricular  size is normal. There is mildly elevated pulmonary artery systolic  pressure.  3. Left atrial size was mildly dilated.  4. Right atrial size was mildly dilated.  5. The mitral valve is grossly normal. Trivial mitral valve  regurgitation.  6. The aortic valve is tricuspid. Aortic valve regurgitation is trivial.   Antibiotics: Anti-infectives (From admission, onward)   Start     Dose/Rate Route Frequency Ordered Stop   04/12/20 0900  ceftazidime-avibactam (AVYCAZ) 0.94 g in dextrose 5 % 50 mL IVPB  Status:  Discontinued        0.94 g 25 mL/hr over 2 Hours Intravenous Every 24 hours 04/11/20 1225 04/17/20 1034   04/10/20 2100  ceftazidime-avibactam  (AVYCAZ) 0.94 g in dextrose 5 % 50 mL IVPB  Status:  Discontinued        0.94 g 25 mL/hr over 2 Hours Intravenous Every 12 hours 04/10/20 2005 04/11/20 1225   04/08/20 1420  ceftazidime-avibactam (AVYCAZ) 0.94 g in dextrose 5 % 50 mL IVPB  Status:  Discontinued        0.94 g 25 mL/hr over 2 Hours Intravenous Daily at bedtime 04/08/20 1420 04/10/20 2005   04/07/20 1800  ceftazidime-avibactam (AVYCAZ) 0.94 g in dextrose 5 % 50 mL IVPB  Status:  Discontinued        0.94 g 25 mL/hr over 2 Hours Intravenous Every 12 hours 04/07/20 1322 04/08/20 1420   04/05/20 1000  fluconazole (DIFLUCAN) 40 MG/ML suspension 100 mg  Status:  Discontinued       "Followed by" Linked Group Details   100 mg Per Tube Daily 04/04/20 2203 04/15/20 1248   04/04/20 2300  fluconazole (DIFLUCAN) 40 MG/ML suspension 200 mg       "Followed by" Linked Group Details   200 mg Per Tube  Once 04/04/20 2203 04/04/20 2325   04/01/20 1013  vancomycin (VANCOCIN) 500-5 MG/100ML-% IVPB       Note to Pharmacy: Cherylann Banas   : cabinet override      04/01/20 1013 04/01/20 1253   03/29/20 1552  vancomycin (VANCOCIN) 500-5 MG/100ML-% IVPB       Note to Pharmacy: Anthonette Legato, Melisa   : cabinet override      03/29/20 1552 03/29/20 1556   03/29/20 1415  fluconazole (DIFLUCAN) 40 MG/ML suspension 100 mg        100 mg Oral Daily 03/29/20 1408 03/31/20 1015   03/28/20 2000  meropenem (MERREM) 500 mg in sodium chloride 0.9 % 100 mL IVPB        500 mg 200 mL/hr over 30 Minutes Intravenous Every 24 hours 03/28/20 1623 04/04/20 2201   03/25/20 1800  cefTAZidime (FORTAZ) 1 g in sodium chloride 0.9 % 100 mL IVPB  Status:  Discontinued       "Followed by" Linked Group Details   1 g 200 mL/hr over 30 Minutes Intravenous Every 24 hours 03/18/20 1015 03/28/20 1613   03/22/20 0830  vancomycin (VANCOREADY) IVPB 500 mg/100 mL        500 mg 100 mL/hr over 60 Minutes Intravenous  Once 03/22/20 0827 03/22/20 1014   03/20/20 1200  vancomycin (VANCOCIN) IVPB  500 mg/100 ml premix        500 mg 100 mL/hr over 60 Minutes Intravenous Every M-W-F (Hemodialysis) 03/19/20 0941 04/03/20 1500   03/18/20 1800  meropenem (MERREM) 500 mg in sodium chloride 0.9 % 100 mL IVPB       "  Followed by" Linked Group Details   500 mg 200 mL/hr over 30 Minutes Intravenous Every 24 hours 03/18/20 1015 03/25/20 0026   03/18/20 1200  vancomycin (VANCOCIN) IVPB 500 mg/100 ml premix        500 mg 100 mL/hr over 60 Minutes Intravenous Every M-W-F (Hemodialysis) 03/18/20 0938 03/18/20 1700   03/18/20 1200  cefTAZidime (FORTAZ) 2 g in sodium chloride 0.9 % 100 mL IVPB  Status:  Discontinued        2 g 200 mL/hr over 30 Minutes Intravenous Every Mon (Hemodialysis) 03/18/20 0939 03/18/20 1015   03/07/20 1800  cefTAZidime (FORTAZ) 2 g in sodium chloride 0.9 % 100 mL IVPB  Status:  Discontinued        2 g 200 mL/hr over 30 Minutes Intravenous Every T-Th-Sa (1800) 03/06/20 1255 03/18/20 1015   03/07/20 1200  vancomycin (VANCOCIN) IVPB 500 mg/100 ml premix  Status:  Discontinued        500 mg 100 mL/hr over 60 Minutes Intravenous Every T-Th-Sa (Hemodialysis) 03/06/20 1255 03/19/20 0941   03/04/20 2000  cefTAZidime (FORTAZ) 2 g in sodium chloride 0.9 % 100 mL IVPB  Status:  Discontinued        2 g 200 mL/hr over 30 Minutes Intravenous Every M-W-F (2000) 03/01/20 1136 03/06/20 1255   03/02/20 1200  vancomycin (VANCOCIN) IVPB 500 mg/100 ml premix        500 mg 100 mL/hr over 60 Minutes Intravenous Every T-Th-Sa (Hemodialysis) 03/01/20 1131 03/02/20 1710   03/01/20 1230  vancomycin (VANCOCIN) IVPB 500 mg/100 ml premix  Status:  Discontinued        500 mg 100 mL/hr over 60 Minutes Intravenous Every M-W-F (Hemodialysis) 03/01/20 1131 03/06/20 1255   02/28/20 1110  vancomycin (VANCOREADY) IVPB 500 mg/100 mL        over 60 Minutes  Continuous PRN 02/28/20 1112 02/28/20 1110   02/28/20 1101  vancomycin (VANCOCIN) 1-5 GM/200ML-% IVPB  Status:  Discontinued       Note to Pharmacy:  Lytle Butte   : cabinet override      02/28/20 1101 02/28/20 1327   02/27/20 1800  cefTAZidime (FORTAZ) 1 g in sodium chloride 0.9 % 100 mL IVPB        1 g 200 mL/hr over 30 Minutes Intravenous Every 24 hours 02/27/20 0947 03/03/20 1738   02/27/20 1200  vancomycin (VANCOCIN) IVPB 500 mg/100 ml premix        500 mg 100 mL/hr over 60 Minutes Intravenous Once 02/27/20 0829 02/27/20 1343   02/24/20 1800  vancomycin (VANCOREADY) IVPB 500 mg/100 mL        500 mg 100 mL/hr over 60 Minutes Intravenous  Once 02/24/20 1308 02/24/20 1905   02/22/20 2200  metroNIDAZOLE (FLAGYL) tablet 500 mg  Status:  Discontinued        500 mg Per Tube Every 8 hours 02/22/20 0957 03/30/20 1057   02/22/20 1700  cefTRIAXone (ROCEPHIN) 2 g in sodium chloride 0.9 % 100 mL IVPB  Status:  Discontinued        2 g 200 mL/hr over 30 Minutes Intravenous Every 24 hours 02/22/20 0957 02/27/20 0947   02/22/20 1200  vancomycin (VANCOCIN) IVPB 500 mg/100 ml premix  Status:  Discontinued        500 mg 100 mL/hr over 60 Minutes Intravenous Every T-Th-Sa (Hemodialysis) 02/20/20 1253 02/21/20 1040   02/21/20 1431  vancomycin variable dose per unstable renal function (pharmacist dosing)  Status:  Discontinued  Does not apply See admin instructions 02/21/20 1431 03/01/20 1131   02/21/20 1200  vancomycin (VANCOREADY) IVPB 500 mg/100 mL        500 mg 100 mL/hr over 60 Minutes Intravenous  Once 02/21/20 0956 02/21/20 1253   02/21/20 0714  vancomycin (VANCOCIN) 500-5 MG/100ML-% IVPB  Status:  Discontinued       Note to Pharmacy: Ashley Akin   : cabinet override      02/21/20 0714 02/21/20 1346   02/20/20 1400  vancomycin (VANCOREADY) IVPB 1250 mg/250 mL        1,250 mg 166.7 mL/hr over 90 Minutes Intravenous  Once 02/20/20 1253 02/20/20 1538   02/20/20 1200  levofloxacin (LEVAQUIN) IVPB 500 mg  Status:  Discontinued        500 mg 100 mL/hr over 60 Minutes Intravenous Every 48 hours 02/20/20 1020 02/21/20 0943   02/15/20  1545  vancomycin (VANCOCIN) IVPB 1000 mg/200 mL premix  Status:  Discontinued       "Followed by" Linked Group Details   1,000 mg 200 mL/hr over 60 Minutes Intravenous Every 24 hours 02/14/20 1530 02/14/20 1745   02/15/20 0330  ceFEPIme (MAXIPIME) 2 g in sodium chloride 0.9 % 100 mL IVPB  Status:  Discontinued        2 g 200 mL/hr over 30 Minutes Intravenous Every 12 hours 02/14/20 1531 02/14/20 1531   02/14/20 1531  ceFEPIme (MAXIPIME) 2 g in sodium chloride 0.9 % 100 mL IVPB  Status:  Discontinued        2 g 200 mL/hr over 30 Minutes Intravenous Every 12 hours 02/14/20 1531 02/14/20 1745   02/14/20 1530  ceFEPIme (MAXIPIME) 2 g in sodium chloride 0.9 % 100 mL IVPB  Status:  Discontinued        2 g 200 mL/hr over 30 Minutes Intravenous  Once 02/14/20 1520 02/14/20 1531   02/14/20 1530  vancomycin (VANCOREADY) IVPB 1500 mg/300 mL  Status:  Discontinued       "Followed by" Linked Group Details   1,500 mg 150 mL/hr over 120 Minutes Intravenous  Once 02/14/20 1530 02/14/20 1745        Time spent: 20 minutes    Erin Hearing ANP [facility] Pager [phone]. If 7PM-7AM, please contact night-coverage at [email or web address] 04/19/2020, 12:37 PM  LOS: 65 days

## 2020-04-19 NOTE — Progress Notes (Signed)
Patient ID: Shane Banks, male   DOB: [birth year], 74 y.o.   MRN: [id number]  [facility] Progress Note   Assessment/ Plan:   1.  Acute on chronic hypoxic respiratory failure: Status post tracheostomy with history of recurrent HCAP. Abx per primary, renal dosing per pharm.  2. ESRD: We will continue with hemodialysis a Monday/Wednesday/Friday schedule.  Given his current deconditioning and nutritional needs as well as ongoing tracheostomy care requirements; he is physically not  suitable for outpatient hemodialysis placement at this time.  Increase UF with HD given hyponatremia; improved but still 129 post HD and 126 this AM.  Limit free water -- don't see order for [facility] but d/c if I missed it.    3. Anemia of CKD: Likely secondary to underlying end-stage renal disease and compounded by acute illness/inflammatory component.  Continue ESA.  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  Continue to follow off of vitamin D receptor analogs  5. Nutrition: tube feeds due to concerns of recurrent aspiration.  6. Hypertension: Blood pressures within acceptable range, optimize volume with HD   Dispo: noted discussion with family re: possible transition of care which I agree is appropriate.    Subjective:    He is not able to offer additional history given mental status.  Noted discussions regarding EoL care and disposition.  Seen on HD - tolerating fine per RN.  Review of systems: unable to obtain 2/2 AMS/patient factors    Objective:   BP (!) 96/52 (BP Location: Left Arm)   Pulse 92   Temp 98.4 F (36.9 C) (Oral)   Resp (!) 35   Ht 5' 9"  (1.753 m)   Wt 66.2 kg   SpO2 100%   BMI 21.55 kg/m   Physical Exam:   Gen: Chronically ill-appearing and cachectic man with tracheostomy.  CVS: S1 and S2 no rub; arms with trace edema bilaterally  Resp: Coarse  breath sounds bilaterally, no distinct rales or rhonchi.  Tachypnea noted unchanged Abd: Soft, colostomy bag in situ.  Feeding tube in situ Ext: Trace upper extremity and lower extremity edema bilaterally; heel protection in place Neuro - awake;  does not follow simple motor commands Access: RIJ tunneled catheter  Labs: BMET Recent Labs  Lab 04/15/20 1026 04/17/20 0558 04/18/20 0245 04/19/20 0822  NA 125* 124* 129* 126*  K 3.7 3.8 3.4* 3.6  CL 92* 92* 97* 93*  CO2 24 23 24 26   GLUCOSE 117* 151* 138* 142*  BUN 107* 101* 55* 95*  CREATININE 2.54* 2.26* 1.61* 2.22*  CALCIUM 9.7 9.3 8.7* 9.0  PHOS 3.6 3.4 2.3* 3.1   CBC Recent Labs  Lab 04/15/20 1026 04/17/20 0558 04/19/20 0825  WBC 13.2* 17.6* 16.9*  HGB 8.9* 8.8* 8.3*  HCT 28.5* 28.6* 27.8*  MCV 76.0* 75.9* 78.3*  PLT 283 285 290      Medications:    . heparin sodium (porcine)      . apixaban  2.5 mg Per Tube BID  . chlorhexidine  15 mL Mouth Rinse BID  . chlorhexidine gluconate (MEDLINE KIT)  15 mL Mouth Rinse BID  . Chlorhexidine Gluconate Cloth  6 each Topical Q0600  . Chlorhexidine Gluconate Cloth  6 each Topical Q0600  . cholestyramine  4 g Per Tube BID  . [START ON 04/22/2020] darbepoetin (ARANESP) injection - DIALYSIS  150 mcg Intravenous Q Mon-HD  .  feeding supplement (PROSource TF)  45 mL Per Tube TID  . insulin aspart  0-6 Units Subcutaneous Q4H  . insulin aspart  2 Units Subcutaneous Q4H  . levothyroxine  25 mcg Per Tube Q0600  . mouth rinse  15 mL Mouth Rinse q12n4p  . metoprolol tartrate  25 mg Per Tube BID  . nutrition supplement (JUVEN)  1 packet Per Tube BID BM  . pantoprazole sodium  40 mg Per Tube Daily   Justin Mend, MD 04/19/2020, 10:32 AM

## 2020-04-19 NOTE — Progress Notes (Signed)
Routine trach change performed.  6.0 XLTD cuffed trach removed without difficulty, and a 6.0 XLTD cuffed trach placed without complications.  Easy cap positive for color change.  Patient tolerated well; no complications noted.

## 2020-04-20 LAB — GLUCOSE, CAPILLARY
Glucose-Capillary: 107 mg/dL — ABNORMAL HIGH (ref 70–99)
Glucose-Capillary: 111 mg/dL — ABNORMAL HIGH (ref 70–99)
Glucose-Capillary: 119 mg/dL — ABNORMAL HIGH (ref 70–99)
Glucose-Capillary: 123 mg/dL — ABNORMAL HIGH (ref 70–99)
Glucose-Capillary: 127 mg/dL — ABNORMAL HIGH (ref 70–99)
Glucose-Capillary: 136 mg/dL — ABNORMAL HIGH (ref 70–99)

## 2020-04-20 NOTE — Progress Notes (Addendum)
Subjective: Opens eyes only response to voice and touch stimuli, no voice response   Objective Vital signs in last 24 hours: Vitals:   04/20/20 0321 04/20/20 0416 04/20/20 0800 04/20/20 0818  BP:   (!) 131/65   Pulse: 89  91   Resp: 22  (!) 28   Temp:  98.5 F (36.9 C)  98.1 F (36.7 C)  TempSrc:  Axillary  Oral  SpO2: 97%     Weight:      Height:       Weight change:   Physical Exam: General: Chronically ill-appearing thin cachectic male with tracheostomy, NAD Heart: Regular rate, controlled ventricular rate, no rub appreciated Lungs: Anterior breath sounds occasional coarse bilaterally, trach oxygen collar/FiO2 28 Abdomen: Bowel sounds normal active, nondistended colostomy in place light brown liquid stool, PEG tube in place Extremities: Trace upper extremity edema, no lower extremity edema, heel protection in place Dialysis Access: Right IJ PermCath Neuro= eyes open, does not track, does not follow simple motor commands  Problem/Plan:  1.  Acute on chronic hypoxic respiratory failure: Status post tracheostomy with history of recurrent HCAP. Abx per primary, renal dosing per pharm.  2. ESRD: We will continue with hemodialysis  Monday/Wednesday/Friday schedule in hospital  hyponatremia;  still 126 prehemodialysis yesterday.  1172 cc UF, Limit free water --   , given his current deconditioning and nutritional needs as well as ongoing tracheostomy care requirements; he is physically not  suitable for outpatient hemodialysis placement at this time.    Noted awaiting out of town family members meeting.  Before DC HD choices is made per admitting team notes  3. Anemia of CKD: Hgb 8.3 on 04/19/2020 likely secondary to underlying end-stage renal disease and compounded by acute illness/inflammatory component.  Continue ESA.  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  Continue to follow off of vitamin D receptor analogs  5. Nutrition: tube feeds due to concerns of recurrent aspiration.  6. Hypertension: Blood pressures within acceptable range, optimize volume with HD    Ernest Haber, PA-C [facility] Beeper [phone] 04/20/2020,11:46 AM  LOS: 66 days   Labs: Basic Metabolic Panel: Recent Labs  Lab 04/17/20 0558 04/18/20 0245 04/19/20 0822  NA 124* 129* 126*  K 3.8 3.4* 3.6  CL 92* 97* 93*  CO2 23 24 26   GLUCOSE 151* 138* 142*  BUN 101* 55* 95*  CREATININE 2.26* 1.61* 2.22*  CALCIUM 9.3 8.7* 9.0  PHOS 3.4 2.3* 3.1   Liver Function Tests: Recent Labs  Lab 04/17/20 0558 04/18/20 0245 04/19/20 0822  ALBUMIN 1.8* 1.9* 1.7*   No results for input(s): LIPASE, AMYLASE in the last 168 hours. No results for input(s): AMMONIA in the last 168 hours. CBC: Recent Labs  Lab 04/15/20 1026 04/17/20 0558 04/19/20 0825  WBC 13.2* 17.6* 16.9*  HGB 8.9* 8.8* 8.3*  HCT 28.5* 28.6* 27.8*  MCV 76.0* 75.9* 78.3*  PLT 283 285 290   Cardiac Enzymes: No results for input(s): CKTOTAL, CKMB, CKMBINDEX, TROPONINI in the last 168 hours. CBG: Recent Labs  Lab 04/19/20 2001 04/19/20 2331 04/20/20 0414 04/20/20 0816 04/20/20 1129  GLUCAP 81 115* 119* 123* 107*    Studies/Results: No results found. Medications: . dextrose 20 mL/hr at 04/16/20 2232  . feeding supplement (NEPRO CARB STEADY) 50 mL/hr at 04/20/20 0500   . apixaban  2.5 mg Per Tube BID  .  chlorhexidine  15 mL Mouth Rinse BID  . chlorhexidine gluconate (MEDLINE KIT)  15 mL Mouth Rinse BID  . Chlorhexidine Gluconate Cloth  6 each Topical Q0600  . Chlorhexidine Gluconate Cloth  6 each Topical Q0600  . cholestyramine  4 g Per Tube BID  . [START ON 04/22/2020] darbepoetin (ARANESP) injection - DIALYSIS  150 mcg Intravenous Q Mon-HD  . feeding supplement (PROSource TF)  45 mL Per Tube TID  . insulin aspart  0-6 Units Subcutaneous Q4H  . insulin aspart   2 Units Subcutaneous Q4H  . levothyroxine  25 mcg Per Tube Q0600  . mouth rinse  15 mL Mouth Rinse q12n4p  . metoprolol tartrate  25 mg Per Tube BID  . nutrition supplement (JUVEN)  1 packet Per Tube BID BM  . pantoprazole sodium  40 mg Per Tube Daily

## 2020-04-20 NOTE — Progress Notes (Signed)
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  Patient was treated for Covid pneumonia in January 2021 and has been on vent because of progressive ARDS.  Glucose record progression he underwent tracheostomy and was discharged to [facility] in February 2021.  He was sent back to [facility] in May due to trach site bleeding suspected hemoptysis.  Also noted to have sacral decubitus at time of presentation in May.  Since admission he has been treated for recurrent multidrug-resistant Pseudomonas pneumonia and MRSA and enterococcal bacteremia.  On 03/15/2020 patient returned to ICU for ventilator and pressor support, he will septic shock and acute kidney injury requiring dialysis.  Sepsis physiology was thought to be due to sacral decubitus ulcer with underlying osteomyelitis.  Patient was weaned off pressors.  PCCM is following for trach weaning with assistance of speech therapy.  Patient is related from ventilator on 03/21/2020 and was transferred to the floor on 03/22/2020.  Due to lack of insurance approval patient can go back to [facility] hospital.  Patient was seen by nephrology and deemed not a candidate for outpatient dialysis.  Patient has already progressed in mobility and PT/ OT has signed off.  On the morning of 7/6 patient developed hypoxemia and increased work of breathing with shallow respiratory effort, requiring frequent suctioning.  O2 sats decreased into the 70s but improved to 95% with bagging and suctioning.  Briefly required increase of FiO2 to 100% but was eventually decreased back to 40%.  Tracheal  aspirate appeared consistent with tube feedings therefore tube feedings were stopped.  He was found to have multidrug-resistant Pseudomonas once again only sensitive to gent this time.  Concerns that this may be related to colonization with recommendation from ID to follow.  Patient later developed fevers within 24 hours after that recommendation on the evening of 7/9 so at the recommendation of infectious disease service given multidrug-resistant Pseudomonas recommendation was started on Fortaz plus [name].  Patient reevaluated by SLP on 7/14.  PMV was placed - his exhalatory effort was extremely diminished to the point he had difficulty moving air to the upper airway.  Able to demonstrate increased vocal intensity without definitive phonation.  The expiratory muscle strength trainer was used at the lowest setting of 5 cm of water with little effort but accurate.  Is able to complete 8 repetitions but reported dizziness.  7/21: Although sister did initiate conversation with home health agency she still remains conflicted about definite discharge plan and transitioning to comfort measures.  Awaiting other family members to visit patient so they can see how poorly he is doing and hopefully with their encouragement she will be able to move forward with recommended plan to transition to comfort measures  Subjective   Patient seen and examined, denies any pain.  No shortness of breath.  Answers questions appropriately.   Assessment/Plan:     1. Acute on chronic hypoxemic respiratory failure-patient required tracheostomy with prolonged mechanical ventilation, secondary to Covid fibrosis and healthcare associated Pseudomonas pneumonia with complaint of aspiration.  Patient completed a dose of IV meropenem as of 04/05/2020.  Repeat sputum culture obtained due to initiation of meropenem has finalized with few  MDR Pseudomonas.  Patient with low-grade fever on 04/05/2020, ID recommended [name] with [name]  as  as of 7/19.  Patient will need total 14 days of antibiotics given recurrence of MDR Pseudomonas. 2. Hypertrophic cardiomyopathy-echocardiogram on 04/16/2020 showed hyperdynamic EF of 70% with severe asymmetric LV hypertrophy contributing cardiologist confirm diagnosis after chart review of outside records. 3. Diarrhea-likely noninfectious in etiology, C. difficile negative.  Likely from antibiotics and with medication.  Continues to have watery stools per colostomy. 4. Funguria/oral yeast-urine culture grew 30,000 colonies of yeast.  Completed course of Diflucan on 7/1. 5. Sacral osteomyelitis/HD catheter sepsis/enterococcal bacteremia-tunneled cath removal on 02/21/2020.  Has tunneled HD catheter right subclavian.  Completed 6 weeks of IV vancomycin and Flagyl on 04/04/2020 6. Paroxysmal atrial fibrillation and flutter/acquired thrombophilia-rate controlled on metoprolol.  Anticoagulation was discontinued due to GI bleed and resumed on 02/26/2020.  No signs of bleeding. 7. CKD stage V-patient requiring hemodialysis secondary to ATN AKI from IV gentamicin.  Patient has been hemodialysis since March 2021.  S/p tunneled dialysis catheter placement on 621 as of 03/28/2020 site has been unremarkable.  Nephrology is following dialysis MWF.  Patient continues to decline medically and has significant weakness.  Patient is unable to tolerate sitting up in recliner receive outpatient dialysis.  Nephrologist has recommended that patient not a candidate for outpatient HD. 8. History of normocytic CVA-CT head showed atrophy and chronic microvascular disease.  Anticoagulation resumed on 02/16/2020. 9. 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 Severe protein calorie malnutrition-continue tube feeds 11. Goals of care-patient has extremely poor prognosis and sister has been made aware of no meaningful recovery from pulmonary standpoint.  Multiple family members will be visiting the patient over the  next few days and then final decision regarding disposition will be made regarding hospice, residential hospice or home with home health.  Patient to continue dialysis till he is in the hospital.   Scheduled medications:   . apixaban  2.5 mg Per Tube BID  . chlorhexidine  15 mL Mouth Rinse BID  . chlorhexidine gluconate (MEDLINE KIT)  15 mL Mouth Rinse BID  . Chlorhexidine Gluconate Cloth  6 each Topical Q0600  . Chlorhexidine Gluconate Cloth  6 each Topical Q0600  . cholestyramine  4 g Per Tube BID  . [START ON 04/22/2020] darbepoetin (ARANESP) injection - DIALYSIS  150 mcg Intravenous Q Mon-HD  . feeding supplement (PROSource TF)  45 mL Per Tube TID  . insulin aspart  0-6 Units Subcutaneous Q4H  . insulin aspart  2 Units Subcutaneous Q4H  . levothyroxine  25 mcg Per Tube Q0600  . mouth rinse  15 mL Mouth Rinse q12n4p  . metoprolol tartrate  25 mg Per Tube BID  . nutrition supplement (JUVEN)  1 packet Per Tube BID BM  . pantoprazole sodium  40 mg Per Tube Daily     Antibiotics:   Anti-infectives (From admission, onward)   Start     Dose/Rate Route Frequency Ordered Stop   04/12/20 0900  ceftazidime-avibactam (AVYCAZ) 0.94 g in dextrose 5 % 50 mL IVPB  Status:  Discontinued        0.94 g 25 mL/hr over 2 Hours Intravenous Every 24 hours 04/11/20 1225 04/17/20 1034   04/10/20 2100  ceftazidime-avibactam (AVYCAZ) 0.94 g in dextrose 5 % 50 mL IVPB  Status:  Discontinued        0.94 g 25 mL/hr over 2 Hours Intravenous Every 12 hours 04/10/20 2005 04/11/20 1225   04/08/20  1420  ceftazidime-avibactam (AVYCAZ) 0.94 g in dextrose 5 % 50 mL IVPB  Status:  Discontinued        0.94 g 25 mL/hr over 2 Hours Intravenous Daily at bedtime 04/08/20 1420 04/10/20 2005   04/07/20 1800  ceftazidime-avibactam (AVYCAZ) 0.94 g in dextrose 5 % 50 mL IVPB  Status:  Discontinued        0.94 g 25 mL/hr over 2 Hours Intravenous Every 12 hours 04/07/20 1322 04/08/20 1420   04/05/20 1000  fluconazole  (DIFLUCAN) 40 MG/ML suspension 100 mg  Status:  Discontinued       "Followed by" Linked Group Details   100 mg Per Tube Daily 04/04/20 2203 04/15/20 1248   04/04/20 2300  fluconazole (DIFLUCAN) 40 MG/ML suspension 200 mg       "Followed by" Linked Group Details   200 mg Per Tube  Once 04/04/20 2203 04/04/20 2325   04/01/20 1013  vancomycin (VANCOCIN) 500-5 MG/100ML-% IVPB       Note to Pharmacy: Cherylann Banas   : cabinet override      04/01/20 1013 04/01/20 1253   03/29/20 1552  vancomycin (VANCOCIN) 500-5 MG/100ML-% IVPB       Note to Pharmacy: Anthonette Legato, Melisa   : cabinet override      03/29/20 1552 03/29/20 1556   03/29/20 1415  fluconazole (DIFLUCAN) 40 MG/ML suspension 100 mg        100 mg Oral Daily 03/29/20 1408 03/31/20 1015   03/28/20 2000  meropenem (MERREM) 500 mg in sodium chloride 0.9 % 100 mL IVPB        500 mg 200 mL/hr over 30 Minutes Intravenous Every 24 hours 03/28/20 1623 04/04/20 2201   03/25/20 1800  cefTAZidime (FORTAZ) 1 g in sodium chloride 0.9 % 100 mL IVPB  Status:  Discontinued       "Followed by" Linked Group Details   1 g 200 mL/hr over 30 Minutes Intravenous Every 24 hours 03/18/20 1015 03/28/20 1613   03/22/20 0830  vancomycin (VANCOREADY) IVPB 500 mg/100 mL        500 mg 100 mL/hr over 60 Minutes Intravenous  Once 03/22/20 0827 03/22/20 1014   03/20/20 1200  vancomycin (VANCOCIN) IVPB 500 mg/100 ml premix        500 mg 100 mL/hr over 60 Minutes Intravenous Every M-W-F (Hemodialysis) 03/19/20 0941 04/03/20 1500   03/18/20 1800  meropenem (MERREM) 500 mg in sodium chloride 0.9 % 100 mL IVPB       "Followed by" Linked Group Details   500 mg 200 mL/hr over 30 Minutes Intravenous Every 24 hours 03/18/20 1015 03/25/20 0026   03/18/20 1200  vancomycin (VANCOCIN) IVPB 500 mg/100 ml premix        500 mg 100 mL/hr over 60 Minutes Intravenous Every M-W-F (Hemodialysis) 03/18/20 0938 03/18/20 1700   03/18/20 1200  cefTAZidime (FORTAZ) 2 g in sodium chloride 0.9 %  100 mL IVPB  Status:  Discontinued        2 g 200 mL/hr over 30 Minutes Intravenous Every Mon (Hemodialysis) 03/18/20 0939 03/18/20 1015   03/07/20 1800  cefTAZidime (FORTAZ) 2 g in sodium chloride 0.9 % 100 mL IVPB  Status:  Discontinued        2 g 200 mL/hr over 30 Minutes Intravenous Every T-Th-Sa (1800) 03/06/20 1255 03/18/20 1015   03/07/20 1200  vancomycin (VANCOCIN) IVPB 500 mg/100 ml premix  Status:  Discontinued        500 mg 100 mL/hr over  60 Minutes Intravenous Every T-Th-Sa (Hemodialysis) 03/06/20 1255 03/19/20 0941   03/04/20 2000  cefTAZidime (FORTAZ) 2 g in sodium chloride 0.9 % 100 mL IVPB  Status:  Discontinued        2 g 200 mL/hr over 30 Minutes Intravenous Every M-W-F (2000) 03/01/20 1136 03/06/20 1255   03/02/20 1200  vancomycin (VANCOCIN) IVPB 500 mg/100 ml premix        500 mg 100 mL/hr over 60 Minutes Intravenous Every T-Th-Sa (Hemodialysis) 03/01/20 1131 03/02/20 1710   03/01/20 1230  vancomycin (VANCOCIN) IVPB 500 mg/100 ml premix  Status:  Discontinued        500 mg 100 mL/hr over 60 Minutes Intravenous Every M-W-F (Hemodialysis) 03/01/20 1131 03/06/20 1255   02/28/20 1110  vancomycin (VANCOREADY) IVPB 500 mg/100 mL        over 60 Minutes  Continuous PRN 02/28/20 1112 02/28/20 1110   02/28/20 1101  vancomycin (VANCOCIN) 1-5 GM/200ML-% IVPB  Status:  Discontinued       Note to Pharmacy: Lytle Butte   : cabinet override      02/28/20 1101 02/28/20 1327   02/27/20 1800  cefTAZidime (FORTAZ) 1 g in sodium chloride 0.9 % 100 mL IVPB        1 g 200 mL/hr over 30 Minutes Intravenous Every 24 hours 02/27/20 0947 03/03/20 1738   02/27/20 1200  vancomycin (VANCOCIN) IVPB 500 mg/100 ml premix        500 mg 100 mL/hr over 60 Minutes Intravenous Once 02/27/20 0829 02/27/20 1343   02/24/20 1800  vancomycin (VANCOREADY) IVPB 500 mg/100 mL        500 mg 100 mL/hr over 60 Minutes Intravenous  Once 02/24/20 1308 02/24/20 1905   02/22/20 2200  metroNIDAZOLE (FLAGYL) tablet  500 mg  Status:  Discontinued        500 mg Per Tube Every 8 hours 02/22/20 0957 03/30/20 1057   02/22/20 1700  cefTRIAXone (ROCEPHIN) 2 g in sodium chloride 0.9 % 100 mL IVPB  Status:  Discontinued        2 g 200 mL/hr over 30 Minutes Intravenous Every 24 hours 02/22/20 0957 02/27/20 0947   02/22/20 1200  vancomycin (VANCOCIN) IVPB 500 mg/100 ml premix  Status:  Discontinued        500 mg 100 mL/hr over 60 Minutes Intravenous Every T-Th-Sa (Hemodialysis) 02/20/20 1253 02/21/20 1040   02/21/20 1431  vancomycin variable dose per unstable renal function (pharmacist dosing)  Status:  Discontinued         Does not apply See admin instructions 02/21/20 1431 03/01/20 1131   02/21/20 1200  vancomycin (VANCOREADY) IVPB 500 mg/100 mL        500 mg 100 mL/hr over 60 Minutes Intravenous  Once 02/21/20 0956 02/21/20 1253   02/21/20 0714  vancomycin (VANCOCIN) 500-5 MG/100ML-% IVPB  Status:  Discontinued       Note to Pharmacy: Ashley Akin   : cabinet override      02/21/20 0714 02/21/20 1346   02/20/20 1400  vancomycin (VANCOREADY) IVPB 1250 mg/250 mL        1,250 mg 166.7 mL/hr over 90 Minutes Intravenous  Once 02/20/20 1253 02/20/20 1538   02/20/20 1200  levofloxacin (LEVAQUIN) IVPB 500 mg  Status:  Discontinued        500 mg 100 mL/hr over 60 Minutes Intravenous Every 48 hours 02/20/20 1020 02/21/20 0943   02/15/20 1545  vancomycin (VANCOCIN) IVPB 1000 mg/200 mL premix  Status:  Discontinued       "  Followed by" Linked Group Details   1,000 mg 200 mL/hr over 60 Minutes Intravenous Every 24 hours 02/14/20 1530 02/14/20 1745   02/15/20 0330  ceFEPIme (MAXIPIME) 2 g in sodium chloride 0.9 % 100 mL IVPB  Status:  Discontinued        2 g 200 mL/hr over 30 Minutes Intravenous Every 12 hours 02/14/20 1531 02/14/20 1531   02/14/20 1531  ceFEPIme (MAXIPIME) 2 g in sodium chloride 0.9 % 100 mL IVPB  Status:  Discontinued        2 g 200 mL/hr over 30 Minutes Intravenous Every 12 hours 02/14/20 1531  02/14/20 1745   02/14/20 1530  ceFEPIme (MAXIPIME) 2 g in sodium chloride 0.9 % 100 mL IVPB  Status:  Discontinued        2 g 200 mL/hr over 30 Minutes Intravenous  Once 02/14/20 1520 02/14/20 1531   02/14/20 1530  vancomycin (VANCOREADY) IVPB 1500 mg/300 mL  Status:  Discontinued       "Followed by" Linked Group Details   1,500 mg 150 mL/hr over 120 Minutes Intravenous  Once 02/14/20 1530 02/14/20 1745      CBG: Recent Labs  Lab 04/19/20 2001 04/19/20 2331 04/20/20 0414 04/20/20 0816 04/20/20 1129  GLUCAP 81 115* 119* 123* 107*    SpO2: 99 % O2 Flow Rate (L/min): 5 L/min FiO2 (%): (S) 21 %    CBC: Recent Labs  Lab 04/15/20 1026 04/17/20 0558 04/19/20 0825  WBC 13.2* 17.6* 16.9*  HGB 8.9* 8.8* 8.3*  HCT 28.5* 28.6* 27.8*  MCV 76.0* 75.9* 78.3*  PLT 283 285 300    Basic Metabolic Panel: Recent Labs  Lab 04/15/20 1026 04/17/20 0558 04/18/20 0245 04/19/20 0822  NA 125* 124* 129* 126*  K 3.7 3.8 3.4* 3.6  CL 92* 92* 97* 93*  CO2 24 23 24 26   GLUCOSE 117* 151* 138* 142*  BUN 107* 101* 55* 95*  CREATININE 2.54* 2.26* 1.61* 2.22*  CALCIUM 9.7 9.3 8.7* 9.0  PHOS 3.6 3.4 2.3* 3.1     Liver Function Tests: Recent Labs  Lab 04/15/20 1026 04/17/20 0558 04/18/20 0245 04/19/20 0822  ALBUMIN 1.8* 1.8* 1.9* 1.7*      DVT prophylaxis: Eliquis  Code Status: Full code  Family Communication: Discussed with patient's sister on phone    Status is: Inpatient  Dispo: The patient is from: LTAC              Anticipated d/c is to: Hospice.  home versus residential hospice              Anticipated d/c date is: 04/22/2020              Patient currently awaiting Palliative care discussion with family regarding different goals of care  Barrier to discharge-unclear disposition, will await final palliative care recommendations  Pressure Injury 02/14/20 Sacrum Medial Stage 4 - Full thickness tissue loss with exposed bone, tendon or muscle. (Active)  02/14/20 1906   Location: Sacrum  Location Orientation: Medial  Staging: Stage 4 - Full thickness tissue loss with exposed bone, tendon or muscle.  Wound Description (Comments):   Present on Admission: Yes     Pressure Injury 04/11/20 Buttocks Left;Lower Stage 3 -  Full thickness tissue loss. Subcutaneous fat may be visible but bone, tendon or muscle are NOT exposed. (Active)  04/11/20   Location: Buttocks  Location Orientation: Left;Lower  Staging: Stage 3 -  Full thickness tissue loss. Subcutaneous fat may be visible but bone,  tendon or muscle are NOT exposed.  Wound Description (Comments):   Present on Admission:      Pressure Injury 04/11/20 Buttocks Right;Lower Stage 2 -  Partial thickness loss of dermis presenting as a shallow open injury with a red, pink wound bed without slough. (Active)  04/11/20   Location: Buttocks  Location Orientation: Right;Lower  Staging: Stage 2 -  Partial thickness loss of dermis presenting as a shallow open injury with a red, pink wound bed without slough.  Wound Description (Comments):   Present on Admission:       Objective   Vitals:   04/20/20 0416 04/20/20 0800 04/20/20 0818 04/20/20 1216  BP:  (!) 131/65  (!) 125/56  Pulse:  91  70  Resp:  (!) 28  (!) 30  Temp: 98.5 F (36.9 C)  98.1 F (36.7 C)   TempSrc: Axillary  Oral   SpO2:    99%  Weight:      Height:        Intake/Output Summary (Last 24 hours) at 04/20/2020 1419 Last data filed at 04/20/2020 0551 Gross per 24 hour  Intake 1525.83 ml  Output 575 ml  Net 950.83 ml    07/22 1901 - 07/24 0700 In: 2525.8  Out: 2497 [Urine:825]  Filed Weights   04/17/20 0426 04/17/20 0730 04/17/20 1113  Weight: 68.2 kg 68.2 kg 66.2 kg    Physical Examination:    General-appears in no acute distress  Heart-S1-S2, regular, no murmur auscultated  Lungs-clear to auscultation bilaterally, no wheezing or crackles auscultated  Abdomen-soft, nontender, no organomegaly  Extremities-no edema in the  lower extremities  Neuro-alert, oriented x3, no focal deficit noted    Data Reviewed:    Studies:  No results found.     Oswald Hillock   [facility] If 7PM-7AM, please contact night-coverage at [email or web address], Office  [phone]   04/20/2020, 2:19 PM  LOS: 66 days

## 2020-04-21 LAB — RENAL FUNCTION PANEL
Albumin: 1.7 g/dL — ABNORMAL LOW (ref 3.5–5.0)
Anion gap: 9 (ref 5–15)
BUN: 81 mg/dL — ABNORMAL HIGH (ref 8–23)
CO2: 23 mmol/L (ref 22–32)
Calcium: 9.3 mg/dL (ref 8.9–10.3)
Chloride: 99 mmol/L (ref 98–111)
Creatinine, Ser: 1.99 mg/dL — ABNORMAL HIGH (ref 0.61–1.24)
GFR calc Af Amer: 38 mL/min — ABNORMAL LOW (ref >60)
GFR calc non Af Amer: 33 mL/min — ABNORMAL LOW (ref >60)
Glucose, Bld: 132 mg/dL — ABNORMAL HIGH (ref 70–99)
Phosphorus: 2.3 mg/dL — ABNORMAL LOW (ref 2.5–4.6)
Potassium: 3.6 mmol/L (ref 3.5–5.1)
Sodium: 131 mmol/L — ABNORMAL LOW (ref 135–145)

## 2020-04-21 LAB — GLUCOSE, CAPILLARY
Glucose-Capillary: 123 mg/dL — ABNORMAL HIGH (ref 70–99)
Glucose-Capillary: 126 mg/dL — ABNORMAL HIGH (ref 70–99)
Glucose-Capillary: 113 mg/dL — ABNORMAL HIGH (ref 70–99)
Glucose-Capillary: 133 mg/dL — ABNORMAL HIGH (ref 70–99)
Glucose-Capillary: 114 mg/dL — ABNORMAL HIGH (ref 70–99)
Glucose-Capillary: 133 mg/dL — ABNORMAL HIGH (ref 70–99)
Glucose-Capillary: 129 mg/dL — ABNORMAL HIGH (ref 70–99)

## 2020-04-21 NOTE — Progress Notes (Signed)
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  Patient was treated for Covid pneumonia in January 2021 and has been on vent because of progressive ARDS.  Glucose record progression he underwent tracheostomy and was discharged to [facility] select in February 2021.  He was sent back to [facility] in May due to trach site bleeding suspected hemoptysis.  Also noted to have sacral decubitus at time of presentation in May.  Since admission he has been treated for recurrent multidrug-resistant Pseudomonas pneumonia and MRSA and enterococcal bacteremia.  On 03/15/2020 patient returned to ICU for ventilator and pressor support, he will septic shock and acute kidney injury requiring dialysis.  Sepsis physiology was thought to be due to sacral decubitus ulcer with underlying osteomyelitis.  Patient was weaned off pressors.  PCCM is following for trach weaning with assistance of speech therapy.  Patient is related from ventilator on 03/21/2020 and was transferred to the floor on 03/22/2020.  Due to lack of insurance approval patient can go back to select hospital.  Patient was seen by nephrology and deemed not a candidate for outpatient dialysis.  Patient has already progressed in mobility and PT/ OT has signed off.  On the morning of 7/6 patient developed hypoxemia and increased work of breathing with shallow respiratory effort, requiring frequent suctioning.  O2 sats decreased into the 70s but improved to 95% with bagging and suctioning.  Briefly required increase of FiO2 to 100% but was eventually decreased back to 40%.  Tracheal  aspirate appeared consistent with tube feedings therefore tube feedings were stopped.  He was found to have multidrug-resistant Pseudomonas once again only sensitive to gent this time.  Concerns that this may be related to colonization with recommendation from ID to follow.  Patient later developed fevers within 24 hours after that recommendation on the evening of 7/9 so at the recommendation of infectious disease service given multidrug-resistant Pseudomonas recommendation was started on Fortaz plus [name].  Patient reevaluated by SLP on 7/14.  PMV was placed - his exhalatory effort was extremely diminished to the point he had difficulty moving air to the upper airway.  Able to demonstrate increased vocal intensity without definitive phonation.  The expiratory muscle strength trainer was used at the lowest setting of 5 cm of water with little effort but accurate.  Is able to complete 8 repetitions but reported dizziness.  7/21: Although sister did initiate conversation with home health agency she still remains conflicted about definite discharge plan and transitioning to comfort measures.  Awaiting other family members to visit patient so they can see how poorly he is doing and hopefully with their encouragement she will be able to move forward with recommended plan to transition to comfort measures  Subjective   Patient seen and examined no new complaints.   Assessment/Plan:     1. Acute on chronic hypoxemic respiratory failure-patient required tracheostomy with prolonged mechanical ventilation, secondary to Covid fibrosis and healthcare associated Pseudomonas pneumonia with complaint of aspiration.  Patient completed a dose of IV meropenem as of 04/05/2020.  Repeat sputum culture obtained due to initiation of meropenem has finalized with few MDR Pseudomonas.  Patient with low-grade fever on 04/05/2020,  ID recommended [name] with [name]  as as of 7/19.  Patient will need total 14 days of antibiotics  given recurrence of MDR Pseudomonas. 2. Hypertrophic cardiomyopathy-echocardiogram on 04/16/2020 showed hyperdynamic EF of 70% with severe asymmetric LV hypertrophy contributing cardiologist confirm diagnosis after chart review of outside records. 3. Diarrhea-likely noninfectious in etiology, C. difficile negative.  Likely from antibiotics and with medication.  Continues to have watery stools per colostomy. 4. Funguria/oral yeast-urine culture grew 30,000 colonies of yeast.  Completed course of Diflucan on 7/1. 5. Sacral osteomyelitis/HD catheter sepsis/enterococcal bacteremia-tunneled cath removal on 02/21/2020.  Has tunneled HD catheter right subclavian.  Completed 6 weeks of IV vancomycin and Flagyl on 04/04/2020 6. Paroxysmal atrial fibrillation and flutter/acquired thrombophilia-rate controlled on metoprolol.  Anticoagulation was discontinued due to GI bleed and resumed on 02/26/2020.  No signs of bleeding. 7. CKD stage V-patient requiring hemodialysis secondary to ATN AKI from IV gentamicin.  Patient has been hemodialysis since March 2021.  S/p tunneled dialysis catheter placement on 621 as of 03/28/2020 site has been unremarkable.  Nephrology is following dialysis MWF.  Patient continues to decline medically and has significant weakness.  Patient is unable to tolerate sitting up in recliner receive outpatient dialysis.  Nephrologist has recommended that patient not a candidate for outpatient HD. 8. History of normocytic CVA-CT head showed atrophy and chronic microvascular disease.  Anticoagulation resumed on 02/16/2020. 9. 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 Severe protein calorie malnutrition-continue tube feeds 11. Goals of care-patient has extremely poor prognosis and sister has been made aware of no meaningful recovery from pulmonary standpoint.  Multiple family members will be visiting the patient over the next few days and then final decision regarding disposition  will be made regarding hospice, residential hospice or home with home health.  Patient to continue dialysis till he is in the hospital.   Scheduled medications:   . apixaban  2.5 mg Per Tube BID  . chlorhexidine  15 mL Mouth Rinse BID  . chlorhexidine gluconate (MEDLINE KIT)  15 mL Mouth Rinse BID  . Chlorhexidine Gluconate Cloth  6 each Topical Q0600  . Chlorhexidine Gluconate Cloth  6 each Topical Q0600  . cholestyramine  4 g Per Tube BID  . [START ON 04/22/2020] darbepoetin (ARANESP) injection - DIALYSIS  150 mcg Intravenous Q Mon-HD  . feeding supplement (PROSource TF)  45 mL Per Tube TID  . insulin aspart  0-6 Units Subcutaneous Q4H  . insulin aspart  2 Units Subcutaneous Q4H  . levothyroxine  25 mcg Per Tube Q0600  . mouth rinse  15 mL Mouth Rinse q12n4p  . metoprolol tartrate  25 mg Per Tube BID  . nutrition supplement (JUVEN)  1 packet Per Tube BID BM  . pantoprazole sodium  40 mg Per Tube Daily     Antibiotics:   Anti-infectives (From admission, onward)   Start     Dose/Rate Route Frequency Ordered Stop   04/12/20 0900  ceftazidime-avibactam (AVYCAZ) 0.94 g in dextrose 5 % 50 mL IVPB  Status:  Discontinued        0.94 g 25 mL/hr over 2 Hours Intravenous Every 24 hours 04/11/20 1225 04/17/20 1034   04/10/20 2100  ceftazidime-avibactam (AVYCAZ) 0.94 g in dextrose 5 % 50 mL IVPB  Status:  Discontinued        0.94 g 25 mL/hr over 2 Hours Intravenous Every 12 hours 04/10/20 2005 04/11/20 1225   04/08/20 1420  ceftazidime-avibactam (AVYCAZ) 0.94 g in dextrose 5 %  50 mL IVPB  Status:  Discontinued        0.94 g 25 mL/hr over 2 Hours Intravenous Daily at bedtime 04/08/20 1420 04/10/20 2005   04/07/20 1800  ceftazidime-avibactam (AVYCAZ) 0.94 g in dextrose 5 % 50 mL IVPB  Status:  Discontinued        0.94 g 25 mL/hr over 2 Hours Intravenous Every 12 hours 04/07/20 1322 04/08/20 1420   04/05/20 1000  fluconazole (DIFLUCAN) 40 MG/ML suspension 100 mg  Status:  Discontinued        "Followed by" Linked Group Details   100 mg Per Tube Daily 04/04/20 2203 04/15/20 1248   04/04/20 2300  fluconazole (DIFLUCAN) 40 MG/ML suspension 200 mg       "Followed by" Linked Group Details   200 mg Per Tube  Once 04/04/20 2203 04/04/20 2325   04/01/20 1013  vancomycin (VANCOCIN) 500-5 MG/100ML-% IVPB       Note to Pharmacy: Cherylann Banas   : cabinet override      04/01/20 1013 04/01/20 1253   03/29/20 1552  vancomycin (VANCOCIN) 500-5 MG/100ML-% IVPB       Note to Pharmacy: Anthonette Legato, Melisa   : cabinet override      03/29/20 1552 03/29/20 1556   03/29/20 1415  fluconazole (DIFLUCAN) 40 MG/ML suspension 100 mg        100 mg Oral Daily 03/29/20 1408 03/31/20 1015   03/28/20 2000  meropenem (MERREM) 500 mg in sodium chloride 0.9 % 100 mL IVPB        500 mg 200 mL/hr over 30 Minutes Intravenous Every 24 hours 03/28/20 1623 04/04/20 2201   03/25/20 1800  cefTAZidime (FORTAZ) 1 g in sodium chloride 0.9 % 100 mL IVPB  Status:  Discontinued       "Followed by" Linked Group Details   1 g 200 mL/hr over 30 Minutes Intravenous Every 24 hours 03/18/20 1015 03/28/20 1613   03/22/20 0830  vancomycin (VANCOREADY) IVPB 500 mg/100 mL        500 mg 100 mL/hr over 60 Minutes Intravenous  Once 03/22/20 0827 03/22/20 1014   03/20/20 1200  vancomycin (VANCOCIN) IVPB 500 mg/100 ml premix        500 mg 100 mL/hr over 60 Minutes Intravenous Every M-W-F (Hemodialysis) 03/19/20 0941 04/03/20 1500   03/18/20 1800  meropenem (MERREM) 500 mg in sodium chloride 0.9 % 100 mL IVPB       "Followed by" Linked Group Details   500 mg 200 mL/hr over 30 Minutes Intravenous Every 24 hours 03/18/20 1015 03/25/20 0026   03/18/20 1200  vancomycin (VANCOCIN) IVPB 500 mg/100 ml premix        500 mg 100 mL/hr over 60 Minutes Intravenous Every M-W-F (Hemodialysis) 03/18/20 0938 03/18/20 1700   03/18/20 1200  cefTAZidime (FORTAZ) 2 g in sodium chloride 0.9 % 100 mL IVPB  Status:  Discontinued        2 g 200 mL/hr over 30  Minutes Intravenous Every Mon (Hemodialysis) 03/18/20 0939 03/18/20 1015   03/07/20 1800  cefTAZidime (FORTAZ) 2 g in sodium chloride 0.9 % 100 mL IVPB  Status:  Discontinued        2 g 200 mL/hr over 30 Minutes Intravenous Every T-Th-Sa (1800) 03/06/20 1255 03/18/20 1015   03/07/20 1200  vancomycin (VANCOCIN) IVPB 500 mg/100 ml premix  Status:  Discontinued        500 mg 100 mL/hr over 60 Minutes Intravenous Every T-Th-Sa (Hemodialysis) 03/06/20 1255 03/19/20 0941  03/04/20 2000  cefTAZidime (FORTAZ) 2 g in sodium chloride 0.9 % 100 mL IVPB  Status:  Discontinued        2 g 200 mL/hr over 30 Minutes Intravenous Every M-W-F (2000) 03/01/20 1136 03/06/20 1255   03/02/20 1200  vancomycin (VANCOCIN) IVPB 500 mg/100 ml premix        500 mg 100 mL/hr over 60 Minutes Intravenous Every T-Th-Sa (Hemodialysis) 03/01/20 1131 03/02/20 1710   03/01/20 1230  vancomycin (VANCOCIN) IVPB 500 mg/100 ml premix  Status:  Discontinued        500 mg 100 mL/hr over 60 Minutes Intravenous Every M-W-F (Hemodialysis) 03/01/20 1131 03/06/20 1255   02/28/20 1110  vancomycin (VANCOREADY) IVPB 500 mg/100 mL        over 60 Minutes  Continuous PRN 02/28/20 1112 02/28/20 1110   02/28/20 1101  vancomycin (VANCOCIN) 1-5 GM/200ML-% IVPB  Status:  Discontinued       Note to Pharmacy: Lytle Butte   : cabinet override      02/28/20 1101 02/28/20 1327   02/27/20 1800  cefTAZidime (FORTAZ) 1 g in sodium chloride 0.9 % 100 mL IVPB        1 g 200 mL/hr over 30 Minutes Intravenous Every 24 hours 02/27/20 0947 03/03/20 1738   02/27/20 1200  vancomycin (VANCOCIN) IVPB 500 mg/100 ml premix        500 mg 100 mL/hr over 60 Minutes Intravenous Once 02/27/20 0829 02/27/20 1343   02/24/20 1800  vancomycin (VANCOREADY) IVPB 500 mg/100 mL        500 mg 100 mL/hr over 60 Minutes Intravenous  Once 02/24/20 1308 02/24/20 1905   02/22/20 2200  metroNIDAZOLE (FLAGYL) tablet 500 mg  Status:  Discontinued        500 mg Per Tube Every 8  hours 02/22/20 0957 03/30/20 1057   02/22/20 1700  cefTRIAXone (ROCEPHIN) 2 g in sodium chloride 0.9 % 100 mL IVPB  Status:  Discontinued        2 g 200 mL/hr over 30 Minutes Intravenous Every 24 hours 02/22/20 0957 02/27/20 0947   02/22/20 1200  vancomycin (VANCOCIN) IVPB 500 mg/100 ml premix  Status:  Discontinued        500 mg 100 mL/hr over 60 Minutes Intravenous Every T-Th-Sa (Hemodialysis) 02/20/20 1253 02/21/20 1040   02/21/20 1431  vancomycin variable dose per unstable renal function (pharmacist dosing)  Status:  Discontinued         Does not apply See admin instructions 02/21/20 1431 03/01/20 1131   02/21/20 1200  vancomycin (VANCOREADY) IVPB 500 mg/100 mL        500 mg 100 mL/hr over 60 Minutes Intravenous  Once 02/21/20 0956 02/21/20 1253   02/21/20 0714  vancomycin (VANCOCIN) 500-5 MG/100ML-% IVPB  Status:  Discontinued       Note to Pharmacy: Ashley Akin   : cabinet override      02/21/20 0714 02/21/20 1346   02/20/20 1400  vancomycin (VANCOREADY) IVPB 1250 mg/250 mL        1,250 mg 166.7 mL/hr over 90 Minutes Intravenous  Once 02/20/20 1253 02/20/20 1538   02/20/20 1200  levofloxacin (LEVAQUIN) IVPB 500 mg  Status:  Discontinued        500 mg 100 mL/hr over 60 Minutes Intravenous Every 48 hours 02/20/20 1020 02/21/20 0943   02/15/20 1545  vancomycin (VANCOCIN) IVPB 1000 mg/200 mL premix  Status:  Discontinued       "Followed by" Linked Group Details  1,000 mg 200 mL/hr over 60 Minutes Intravenous Every 24 hours 02/14/20 1530 02/14/20 1745   02/15/20 0330  ceFEPIme (MAXIPIME) 2 g in sodium chloride 0.9 % 100 mL IVPB  Status:  Discontinued        2 g 200 mL/hr over 30 Minutes Intravenous Every 12 hours 02/14/20 1531 02/14/20 1531   02/14/20 1531  ceFEPIme (MAXIPIME) 2 g in sodium chloride 0.9 % 100 mL IVPB  Status:  Discontinued        2 g 200 mL/hr over 30 Minutes Intravenous Every 12 hours 02/14/20 1531 02/14/20 1745   02/14/20 1530  ceFEPIme (MAXIPIME) 2 g in sodium  chloride 0.9 % 100 mL IVPB  Status:  Discontinued        2 g 200 mL/hr over 30 Minutes Intravenous  Once 02/14/20 1520 02/14/20 1531   02/14/20 1530  vancomycin (VANCOREADY) IVPB 1500 mg/300 mL  Status:  Discontinued       "Followed by" Linked Group Details   1,500 mg 150 mL/hr over 120 Minutes Intravenous  Once 02/14/20 1530 02/14/20 1745      CBG: Recent Labs  Lab 04/20/20 2333 04/21/20 0341 04/21/20 0346 04/21/20 0716 04/21/20 1115  GLUCAP 127* 126* 123* 113* 133*    SpO2: 94 % O2 Flow Rate (L/min): 5 L/min FiO2 (%): 21 %    CBC: Recent Labs  Lab 04/15/20 1026 04/17/20 0558 04/19/20 0825  WBC 13.2* 17.6* 16.9*  HGB 8.9* 8.8* 8.3*  HCT 28.5* 28.6* 27.8*  MCV 76.0* 75.9* 78.3*  PLT 283 285 761    Basic Metabolic Panel: Recent Labs  Lab 04/15/20 1026 04/17/20 0558 04/18/20 0245 04/19/20 0822 04/21/20 0329  NA 125* 124* 129* 126* 131*  K 3.7 3.8 3.4* 3.6 3.6  CL 92* 92* 97* 93* 99  CO2 24 23 24 26 23   GLUCOSE 117* 151* 138* 142* 132*  BUN 107* 101* 55* 95* 81*  CREATININE 2.54* 2.26* 1.61* 2.22* 1.99*  CALCIUM 9.7 9.3 8.7* 9.0 9.3  PHOS 3.6 3.4 2.3* 3.1 2.3*     Liver Function Tests: Recent Labs  Lab 04/15/20 1026 04/17/20 0558 04/18/20 0245 04/19/20 0822 04/21/20 0329  ALBUMIN 1.8* 1.8* 1.9* 1.7* 1.7*      DVT prophylaxis: Eliquis  Code Status: Full code  Family Communication: Discussed with patient's sister on phone on 04/20/2020    Status is: Inpatient  Dispo: The patient is from: LTAC              Anticipated d/c is to: Hospice.  home versus residential hospice              Anticipated d/c date is: 04/24/2020              Patient currently awaiting Palliative care discussion with family regarding definite goals of care  Barrier to discharge-unclear disposition, will await final palliative care recommendations  Pressure Injury 02/14/20 Sacrum Medial Stage 4 - Full thickness tissue loss with exposed bone, tendon or muscle.  (Active)  02/14/20 1906  Location: Sacrum  Location Orientation: Medial  Staging: Stage 4 - Full thickness tissue loss with exposed bone, tendon or muscle.  Wound Description (Comments):   Present on Admission: Yes     Pressure Injury 04/11/20 Buttocks Left;Lower Stage 3 -  Full thickness tissue loss. Subcutaneous fat may be visible but bone, tendon or muscle are NOT exposed. (Active)  04/11/20   Location: Buttocks  Location Orientation: Left;Lower  Staging: Stage 3 -  Full thickness tissue  loss. Subcutaneous fat may be visible but bone, tendon or muscle are NOT exposed.  Wound Description (Comments):   Present on Admission:       Objective   Vitals:   04/21/20 0718 04/21/20 0735 04/21/20 1117 04/21/20 1140  BP: (!) 122/62 (!) 132/73 (!) 149/77 (!) 149/77  Pulse:  85 76 83  Resp:  (!) 24 (!) 33 (!) 45  Temp: 98.8 F (37.1 C)  99 F (37.2 C)   TempSrc: Axillary  Axillary   SpO2:  94%  94%  Weight:      Height:        Intake/Output Summary (Last 24 hours) at 04/21/2020 1402 Last data filed at 04/21/2020 0400 Gross per 24 hour  Intake 1572.33 ml  Output 500 ml  Net 1072.33 ml    07/23 1901 - 07/25 0700 In: 2894.8  Out: 875 [Urine:275]  Filed Weights   04/17/20 0426 04/17/20 0730 04/17/20 1113  Weight: 68.2 kg 68.2 kg 66.2 kg    Physical Examination:    General-appears in no acute distress  Heart-S1-S2, regular, no murmur auscultated  Lungs-clear to auscultation bilaterally, no wheezing or crackles auscultated  Abdomen-soft, nontender, no organomegaly, PEG tube in place  Extremities-no edema in the lower extremities  Neuro-alert, following commands    Data Reviewed:    Studies:  No results found.     Oswald Hillock   [facility] If 7PM-7AM, please contact night-coverage at [email or web address], Office  [phone]   04/21/2020, 2:02 PM  LOS: 67 days

## 2020-04-21 NOTE — Progress Notes (Addendum)
Subjective: This a.m. opens eyes does track otherwise no voice response or follows commands  Objective Vital signs in last 24 hours: Vitals:   04/21/20 0425 04/21/20 0718 04/21/20 0735 04/21/20 1117  BP:  (!) 122/62 (!) 132/73 (!) 149/77  Pulse:   85 76  Resp: 17  (!) 24 (!) 33  Temp:  98.8 F (37.1 C)  99 F (37.2 C)  TempSrc:  Axillary  Axillary  SpO2:   94%   Weight:      Height:       Weight change:   Physical Exam: General: Chronically ill-appearing thin cachectic male with tracheostomy, NAD Heart:  Irregular irregular with, controlled ventricular rate, no rub appreciated Lungs: Anterior breath sounds occasional coarse bilaterally, trach oxygen collar/FiO2 28 Abdomen: Bowel sounds normal active, nondistended colostomy in place light brown liquid stool, PEG tube in place Extremities: Trace upper extremity edema, no lower extremity edema, heel protection in place Dialysis Access: Right IJ PermCath Neuro= eyes open, this a.m. does  track, does not follow simple motor commands  Problem/Plan:  1. Acute on chronic hypoxic respiratory failure: Status post tracheostomy with history of recurrent HCAP. Abx per primary, renal dosing per pharm.  2. ESRD: We will continue with hemodialysis  Monday/Wednesday/Friday schedule in hospital  hyponatremia;  still 126 prehemodialysis 7/23 improved to 131 on 04/21/2020   , given his current deconditioning and nutritional needs as well as ongoing tracheostomy care requirements; he is physically not suitable for outpatient hemodialysis placement at this time.   Noted awaiting out of town family members meeting.  Before DC HD choices is made per admitting team notes  Plan for HD in the tomorrow, plan for second shift HD, unless otherwise told with family request  3. Anemia of CKD: Hgb 8.3 on 04/19/2020 likely secondary to underlying end-stage renal disease and compounded by acute illness/inflammatory component. Continue ESA. Have  increased aranesp to 150 mcg every Monday  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 Continue to follow off of vitamin D receptor analogs  5. Nutrition: tube feeds due to concerns of recurrent aspiration.  6. Hypertension: Blood pressures within acceptable range, optimize volume with HD   Ernest Haber, PA-C [facility] Beeper [phone] 04/21/2020,11:35 AM  LOS: 67 days   Labs: Basic Metabolic Panel: Recent Labs  Lab 04/18/20 0245 04/19/20 0822 04/21/20 0329  NA 129* 126* 131*  K 3.4* 3.6 3.6  CL 97* 93* 99  CO2 24 26 23   GLUCOSE 138* 142* 132*  BUN 55* 95* 81*  CREATININE 1.61* 2.22* 1.99*  CALCIUM 8.7* 9.0 9.3  PHOS 2.3* 3.1 2.3*   Liver Function Tests: Recent Labs  Lab 04/18/20 0245 04/19/20 0822 04/21/20 0329  ALBUMIN 1.9* 1.7* 1.7*   No results for input(s): LIPASE, AMYLASE in the last 168 hours. No results for input(s): AMMONIA in the last 168 hours. CBC: Recent Labs  Lab 04/15/20 1026 04/17/20 0558 04/19/20 0825  WBC 13.2* 17.6* 16.9*  HGB 8.9* 8.8* 8.3*  HCT 28.5* 28.6* 27.8*  MCV 76.0* 75.9* 78.3*  PLT 283 285 290   Cardiac Enzymes: No results for input(s): CKTOTAL, CKMB, CKMBINDEX, TROPONINI in the last 168 hours. CBG: Recent Labs  Lab 04/20/20 2333 04/21/20 0341 04/21/20 0346 04/21/20 0716 04/21/20 1115  GLUCAP 127* 126* 123* 113* 133*    Studies/Results: No results found. Medications:  dextrose 20 mL/hr at 04/16/20 2232   feeding supplement (NEPRO CARB STEADY) 1,000  mL (04/21/20 0601)    apixaban  2.5 mg Per Tube BID   chlorhexidine  15 mL Mouth Rinse BID   chlorhexidine gluconate (MEDLINE KIT)  15 mL Mouth Rinse BID   Chlorhexidine Gluconate Cloth  6 each Topical Q0600   Chlorhexidine Gluconate Cloth  6 each Topical Q0600   cholestyramine  4 g Per Tube BID   [START ON 04/22/2020] darbepoetin (ARANESP) injection - DIALYSIS   150 mcg Intravenous Q Mon-HD   feeding supplement (PROSource TF)  45 mL Per Tube TID   insulin aspart  0-6 Units Subcutaneous Q4H   insulin aspart  2 Units Subcutaneous Q4H   levothyroxine  25 mcg Per Tube Q0600   mouth rinse  15 mL Mouth Rinse q12n4p   metoprolol tartrate  25 mg Per Tube BID   nutrition supplement (JUVEN)  1 packet Per Tube BID BM   pantoprazole sodium  40 mg Per Tube Daily

## 2020-04-22 DIAGNOSIS — Z930 Tracheostomy status: Secondary | ICD-10-CM

## 2020-04-22 LAB — CBC
HCT: 30.2 % — ABNORMAL LOW (ref 39.0–52.0)
Hemoglobin: 9.3 g/dL — ABNORMAL LOW (ref 13.0–17.0)
MCH: 23.3 pg — ABNORMAL LOW (ref 26.0–34.0)
MCHC: 30.8 g/dL (ref 30.0–36.0)
MCV: 75.5 fL — ABNORMAL LOW (ref 80.0–100.0)
Platelets: 280 10*3/uL (ref 150–400)
RBC: 4 MIL/uL — ABNORMAL LOW (ref 4.22–5.81)
RDW: 17.1 % — ABNORMAL HIGH (ref 11.5–15.5)
WBC: 15.8 10*3/uL — ABNORMAL HIGH (ref 4.0–10.5)
nRBC: 0.1 % (ref 0.0–0.2)

## 2020-04-22 LAB — GLUCOSE, CAPILLARY
Glucose-Capillary: 128 mg/dL — ABNORMAL HIGH (ref 70–99)
Glucose-Capillary: 137 mg/dL — ABNORMAL HIGH (ref 70–99)
Glucose-Capillary: 128 mg/dL — ABNORMAL HIGH (ref 70–99)
Glucose-Capillary: 125 mg/dL — ABNORMAL HIGH (ref 70–99)
Glucose-Capillary: 141 mg/dL — ABNORMAL HIGH (ref 70–99)
Glucose-Capillary: 137 mg/dL — ABNORMAL HIGH (ref 70–99)

## 2020-04-22 LAB — RENAL FUNCTION PANEL
Albumin: 1.8 g/dL — ABNORMAL LOW (ref 3.5–5.0)
Anion gap: 9 (ref 5–15)
BUN: 123 mg/dL — ABNORMAL HIGH (ref 8–23)
CO2: 24 mmol/L (ref 22–32)
Calcium: 9.6 mg/dL (ref 8.9–10.3)
Chloride: 95 mmol/L — ABNORMAL LOW (ref 98–111)
Creatinine, Ser: 2.45 mg/dL — ABNORMAL HIGH (ref 0.61–1.24)
GFR calc Af Amer: 29 mL/min — ABNORMAL LOW (ref >60)
GFR calc non Af Amer: 25 mL/min — ABNORMAL LOW (ref >60)
Glucose, Bld: 152 mg/dL — ABNORMAL HIGH (ref 70–99)
Phosphorus: 3.6 mg/dL (ref 2.5–4.6)
Potassium: 3.6 mmol/L (ref 3.5–5.1)
Sodium: 128 mmol/L — ABNORMAL LOW (ref 135–145)

## 2020-04-22 MED ORDER — HEPARIN SODIUM (PORCINE) 1000 UNIT/ML DIALYSIS: 2000.0000 [IU] | INTRAMUSCULAR | Status: DC | PRN

## 2020-04-22 MED ORDER — HEPARIN SODIUM (PORCINE) 1000 UNIT/ML DIALYSIS: 20.0000 [IU]/kg | INTRAMUSCULAR | Status: DC | PRN

## 2020-04-22 MED ORDER — SODIUM CHLORIDE 0.9 % IV SOLN: 100.0000 mL | INTRAVENOUS | Status: DC | PRN

## 2020-04-22 MED ORDER — PENTAFLUOROPROP-TETRAFLUOROETH EX AERO: 1.0000 "application " | INHALATION_SPRAY | CUTANEOUS | Status: DC | PRN

## 2020-04-22 MED ORDER — HEPARIN SODIUM (PORCINE) 1000 UNIT/ML DIALYSIS: 2500.0000 [IU] | INTRAMUSCULAR | Status: DC | PRN

## 2020-04-22 MED ORDER — HEPARIN SODIUM (PORCINE) 1000 UNIT/ML DIALYSIS: 1500.0000 [IU] | INTRAMUSCULAR | Status: DC | PRN

## 2020-04-22 MED ORDER — DARBEPOETIN ALFA 150 MCG/0.3ML IJ SOSY
PREFILLED_SYRINGE | INTRAMUSCULAR | Status: AC
  Filled 2020-04-22: qty 0.3

## 2020-04-22 MED ORDER — ALTEPLASE 2 MG IJ SOLR: 2.0000 mg | Freq: Once | INTRAMUSCULAR | Status: DC | PRN

## 2020-04-22 MED ORDER — LIDOCAINE-PRILOCAINE 2.5-2.5 % EX CREA: 1.0000 "application " | TOPICAL_CREAM | CUTANEOUS | Status: DC | PRN

## 2020-04-22 MED ORDER — HEPARIN SODIUM (PORCINE) 1000 UNIT/ML DIALYSIS: 2000.0000 [IU] | Freq: Once | INTRAMUSCULAR | Status: DC

## 2020-04-22 MED ORDER — HEPARIN SODIUM (PORCINE) 1000 UNIT/ML DIALYSIS: 40.0000 [IU]/kg | INTRAMUSCULAR | Status: DC | PRN

## 2020-04-22 MED ORDER — LIDOCAINE HCL (PF) 1 % IJ SOLN: 5.0000 mL | INTRAMUSCULAR | Status: DC | PRN

## 2020-04-22 MED ORDER — HEPARIN SODIUM (PORCINE) 1000 UNIT/ML DIALYSIS: 1000.0000 [IU] | INTRAMUSCULAR | Status: DC | PRN

## 2020-04-22 MED ORDER — HEPARIN SODIUM (PORCINE) 1000 UNIT/ML IJ SOLN
INTRAMUSCULAR | Status: AC
  Filled 2020-04-22: qty 4

## 2020-04-22 NOTE — Progress Notes (Signed)
Nutrition Follow-up  DOCUMENTATION CODES:   Underweight, Severe malnutrition in context of chronic illness  INTERVENTION:   Continue tube feeding via PEG: - Nepro @ 50 ml/hr (1200 ml/day) - ProSource 45 ml TID -Juven BID  Tube feeding regimen provides 2280 kcal, 130 grams of protein, and 872 ml of H2O.   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  Ongoing  GOAL:   Patient will meet greater than or equal to 90% of their needs  Addressed via TF  MONITOR:   Diet advancement, Labs, Weight trends, TF tolerance, Skin, I & O's  REASON FOR ASSESSMENT:   Ventilator, Other (hx PEG)    ASSESSMENT:   74 yo male admitted with hemoptysis. 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  6/02 - s/p new [facility] 6/07 - trach collar 6/18 - returned to ICU for increased secretions requiring vent support 6/24 - back on trach collar 6/25 - transferred out of ICU  Was able to lift finger on command over weekend per MD. Now mental status slightly declined. Sister asking about outpatient HD options. Remains full code. Per RN, tolerating TF at goal. States stool output looks formed. Continue TF to meet 100% of needs.   EDW: 62.8 kg Current weight: 69.5 kg  Medications: questran, aranesp, SS novolog Labs: Na 128 (L) CBG 114-137  Diet Order:   Diet Order            Diet NPO time specified  Diet effective now                 EDUCATION NEEDS:   No education needs have been identified at this time  Skin:  Skin Assessment: Skin Integrity Issues: Stage IV: sacrum Stage III: buttocks Stage II: buttocks   Last BM:  7/25-via colostomy  Height:   Ht Readings from Last 1 Encounters:  03/18/20 5\' 9"  (1.753 m)    Weight:   Wt Readings from Last 1 Encounters:  04/22/20  69.5 kg    Ideal Body Weight:  72.7 kg  BMI:  Body mass index is 22.63 kg/m.  Estimated Nutritional Needs:   Kcal:  2200-2400  Protein:  110-135 grams  Fluid:  1 L + UOP   Lipa Knauff RD, LDN Clinical Nutrition Pager listed in [place]

## 2020-04-22 NOTE — Progress Notes (Signed)
Patient ID: Shane Banks, male   DOB: Jul 02, 1946, 74 y.o.   MRN: [id number]  [facility] Progress Note   Assessment/ Plan:   1.  Acute on chronic hypoxic respiratory failure: Status post tracheostomy with transfer to the acute hospital after developing some bleeding around tracheostomy site.  He also has a history of recurrent HCAP and has earlier completed the course of antibiotics. 2. 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  Given his current deconditioning and nutritional needs as well as ongoing tracheostomy care requirements; he is physically suitable for outpatient hemodialysis placement.  I will call his sister [name] today [facility]) to discuss this. 3. Anemia: Likely secondary to underlying end-stage renal disease and compounded by acute illness/inflammatory component.  Continue ESA. 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 5. Nutrition: Remains n.p.o. and getting tube feeds due to concerns of recurrent aspiration.  Remains cachectic and with moderate to severe protein calorie malnutrition. 6. Hypertension: Blood pressures within acceptable range, will continue efforts at volume unloading to match input and help alleviate edema.  Subjective:   No acute events noted from overnight.   Objective:   BP (!) 133/79   Pulse 88   Temp 99 F (37.2 C)   Resp (!) 24   Ht 5' 9"  (1.753 m)   Wt 69.5 kg   SpO2 93%   BMI 22.63 kg/m   Physical Exam: Gen: Chronically ill-appearing and cachectic man with tracheostomy.  He is awake and attempts to respond to questions. CVS: Pulse regular rhythm, normal rate, S1 and S2 normal Resp: Coarse breath sounds bilaterally, no distinct rales or rhonchi.  Right IJ TDC Abd: Soft, colostomy  bag in situ.  Feeding tube in situ Ext: Trace upper extremity edema without lower extremity edema.  Some dependent edema noted over lower back.  Labs: BMET Recent Labs  Lab 04/15/20 1026 04/17/20 0558 04/18/20 0245 04/19/20 0822 04/21/20 0329  NA 125* 124* 129* 126* 131*  K 3.7 3.8 3.4* 3.6 3.6  CL 92* 92* 97* 93* 99  CO2 24 23 24 26 23   GLUCOSE 117* 151* 138* 142* 132*  BUN 107* 101* 55* 95* 81*  CREATININE 2.54* 2.26* 1.61* 2.22* 1.99*  CALCIUM 9.7 9.3 8.7* 9.0 9.3  PHOS 3.6 3.4 2.3* 3.1 2.3*   CBC Recent Labs  Lab 04/15/20 1026 04/17/20 0558 04/19/20 0825  WBC 13.2* 17.6* 16.9*  HGB 8.9* 8.8* 8.3*  HCT 28.5* 28.6* 27.8*  MCV 76.0* 75.9* 78.3*  PLT 283 285 290      Medications:    . apixaban  2.5 mg Per Tube BID  . chlorhexidine  15 mL Mouth Rinse BID  . chlorhexidine gluconate (MEDLINE KIT)  15 mL Mouth Rinse BID  . Chlorhexidine Gluconate Cloth  6 each Topical Q0600  . Chlorhexidine Gluconate Cloth  6 each Topical Q0600  . cholestyramine  4 g Per Tube BID  . darbepoetin (ARANESP) injection - DIALYSIS  150 mcg Intravenous Q Mon-HD  . feeding supplement (PROSource TF)  45 mL Per Tube TID  . insulin aspart  0-6 Units Subcutaneous Q4H  . insulin aspart  2 Units Subcutaneous Q4H  . levothyroxine  25 mcg Per Tube Q0600  . mouth rinse  15 mL Mouth Rinse q12n4p  . metoprolol tartrate  25 mg  Per Tube BID  . nutrition supplement (JUVEN)  1 packet Per Tube BID BM  . pantoprazole sodium  40 mg Per Tube Daily   Elmarie Shiley, MD 04/22/2020, 9:24 AM

## 2020-04-22 NOTE — Progress Notes (Signed)
NAME:  Shane Banks, MRN:  [id number], DOB:  September 15, 1946, LOS: 62 ADMISSION DATE:  02/14/2020, CONSULTATION DATE:  5/19 REFERRING MD:  Dr. Alvino Chapel, CHIEF COMPLAINT:  Hemoptysis   Brief History   74 yo m with PMHx of afib with RVR, BPH, stroke, and COVID ARDS in January requiring trach and LTACH placement at [facility]. Had been tolerating trach collar, but on 5/19 developed hemoptysis and hypoxemic respiratory failure requiring vent. Transferred to [facility] on 5/19.  Past Medical History   has a past medical history of Acute on chronic respiratory failure with hypoxia ([place]), Atrial fibrillation with RVR ([place]), BPH (benign prostatic hyperplasia), COVID-19 virus infection, Pneumonia due to COVID-19 virus, Severe sepsis ([place]), and Stroke ([place]).  Significant Hospital Events   1/8  Admit to [facility] for COVID PNA 2/23 Transfer to [facility] on vent 5/19 Transfer to [facility] for hemoptysis. #6 cuffed trach placed. Back on vent. Bronch performed 5/20 Transitioned to trach collar.  5/22 back on vent full time 5/24 back on antibiotics for fevers 6/1 Initiating PSV weans 6/3 Large cuff leak, trach exchange 6/7 transitioned to [facility] 6/9 remains on TC continuously since 6/7 6/23-tolerating trach collar today 6/25-left of the ventilator last evening and tolerated well; tx to TRH/ floor 04/02/2020 new episode of hypoxia copious secretions tachypnea questionable need to return to ICU pulmonary critical care reconsulted 7/9 low grade temps/ ongoing secretions s/p 8 days meropenum 7/11 changed to [place] per ID recs   Consults:  ENT Nephrology ID PCCM  Procedures:  ENT flex scope 5/19 > no bleeding source identified FOB 5/19 > no pulmonary bleeding source identified.   Significant Diagnostic Tests:  CXR 5/28 >> R CVC placement confirmed Slightly improved aeration at the right base. 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 Edema in the muscles around the hips and in the posterior paraspinal musculature of the lower lumbar spine and in the buttocks which could represent myositis. Fluid-fluid level in the bladder may represent protein or debris in the bladder. The possibility of urinary tract infection should be considered.  CXR 6/3> improved R lung aeration, worsening L sided opacity. Tracheostomy tube, HD catheter.   CXR 6/4>wosened R lung infiltrates and stable L sided infiltrate. Possible bilateral pleural effusions  Chest x-ray 6/24-basal infiltrates, stable from prior  CXR 7/11 >> 1. Support apparatus as above. 2. Decreasing interstitial opacities. 3. Decreasing effusion and opacity in the left base.  Micro Data:    tracheal aspirate 5/7> for Pseudomonas A  BAL 5/19 > no growth 5/19 blood > NG 5/22 trach aspirate>> candida [place] 5/22 blood>> 1/4 GPC> staph epi 5/25 blood>> GPC (enterococcus on biofire) 2/4 bottles>> E. Faecalis, staph epi. 5/25 trach aspirate>> abundant corynebacterium striatum, few candida parapsilosis 5/25 blood cx>> + for Enterococcus Faecalis (gent resistant)/ Staphylococcus Epidermidis (tetracycline, vanc, rifampin sensitive) 5/27 BCx> neg 6/6 BCx2 >> neg 6/19 trach asp >> Pseudomonas (sensitive to imipenem, intermediate gentamicin) 6/20 BCx2 >> neg 6/30 MRSA PCR >> neg 7/1 UC  >> 30K yeast  7/2 Cdif >> neg 04/01/2020 sputum culture >> pseudomonas (sensitive to gentamicin only)   Antimicrobials:  Levofloxacin 5/24>5/26 vanc 5/25>off 7/5 Ceftriaxone  5/27>6/1 Metronidazole 5/27> off Fortaz 6/1>off Meropenem 03/28/2020>>7/8 Fluconazole 7/8 >> 7/10 Avycaz 7/11 >>  Interim history/subjective:  Patient seen in HD unit.  No distress. Nods to signify his breathing  is without difficulty.  On ATC humidified room air.  Per RT secretions seem to be  thinning out quite a bit.   Objective   Blood pressure (!) 153/62, pulse 73, temperature 99 F (37.2 C), resp. rate (!) 24, height _0  (1.753 m), weight 69.5 kg, SpO2 91 %.    FiO2 (%):  [21 %] 21 %   Intake/Output Summary (Last 24 hours) at 04/22/2020 1524 Last data filed at 04/22/2020 0500 Gross per 24 hour  Intake 1632.5 ml  Output 750 ml  Net 882.5 ml   Filed Weights   04/17/20 0730 04/17/20 1113 04/22/20 0454  Weight: 68.2 kg 66.2 kg 69.5 kg    Examination:    General: Frail elderly male in NAD on  Hemodialysis  HEENT: midline Shiley 6 XLT distal secure and intact. No secretions. Dressing clean.  Neuro: Awake,  Does seems to track me intermittently and did nod in the affirmative to a question for me.  CV: rr,  R TDC HD cath PULM:  No distress. Clear breath sounds. Normal effort.  GI: +bs, NT, ND, gtube Extremities: no acute deformity. No edema.   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  Secretions have thinned and he has been on trach collar for quite some time now. May be reasonable to exchange his trach for a 6 cuffless. Will plan to see him again 7/26 while off HD to decide.  NPO given ongoing concerns of aspiration, SLP following  Ongoing goals of care discussions per primary   Georgann Housekeeper, AGACNP-BC [place] for personal pager PCCM on call pager [phone]  04/22/2020 3:25 PM

## 2020-04-22 NOTE — Progress Notes (Signed)
[facility] PROGRESS NOTE  Shane Banks TKW:[id number] DOB: [birth year] DOA: 02/14/2020 PCP: Shane Border, MD  Status: Inpatient--Remains inpatient appropriate because :Unsafe d/c plan, IV treatments appropriate due to intensity of illness or inability to take PO and Inpatient level of care appropriate due to severity of illness-patient continues to have tenuous respiratory status   Dispo:  Patient From:  Home  Planned Disposition:    Expected discharge date:    Medically stable for discharge: No-needs to transition to cuffless trach (respiratory status too tenuous to accomplish), needs to be able to sit upright in recliner to pursue dialysis in the outpatient setting (profoundly physically deconditioned and unable to even lift arms at this juncture),   7/20; spoke with Shane Banks.  Agreed to delay decision making regarding comfort measures until all family has had opportunity to come visit with patient.  Also briefly discussed death and dying in the dialysis patient and reassured her that we would continue dialysis right up until transport to ensure patient comfort and stability for transport.  Attending physician over the weekend stated he had also spoken with patient's sister who had questions regarding whether outpatient hemodialysis could be achieved by transporting the patient back and forth to the hospital.  She was also requesting to speak with nephrology on 7/26 nephrologist Shane Banks made aware and he plans on contacting patient's sister to discuss this issue.  Code Status: Full Family Communication: Shane Banks sister 7/14 and 04/11/20-VM left 7/23 re: any further family visits planned before decision made about comfort measures, brother Shane Banks on 04/02/20; DVT prophylaxis: Eliquis  HPI: 74 y.o. 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 Patient treated for Covid pneumonia January 2021 and had been on the vent because of progressive ARDS.  Because of lack of progression he underwent tracheostomy and was [facility].  Was sent back to this facility in May due to trach site bleeding suspected hemoptysis.  He was also noted with sacral decubitus at time of presentation in May.  Since admission he has been treated for recurrent multidrug-resistant Pseudomonas pneumonia and MRSA and enterococcal bacteremia.  On 03/15/2020 patient returned to the ICU for ventilatory and pressor support he developed septic shock and developed acute renal failure requiring dialysis.  He developed sepsis physiology likely secondary to sacral decubitus ulcer with underlying osteomyelitis.    He has been weaned from pressors. PCCM is following for trach weaning with the assistance of SLP.  Patient was liberated from the vent on 03/21/2020 and was transferred to the floor in 03/22/2020.  Due to the lack of insurance approval he cannot go back to [facility].  Social worker is working on Clinical research associate.  He will need to be transition to a cuffless trach which unfortunately has not been accomplished due to recurrent Shane Banks with respiratory failure physiology.  He has been deemed to not be a candidate for outpatient hemodialysis by the nephrologist.  Patient has not made any progress in mobility OT and PT have signed off.  Several weeks ago he had issues regarding aspiration pneumonitis on top of multidrug-resistant Pseudomonas pneumonia that has been treated with [name]  and [name].  He continues to remain quite weak and is unable to lift arms off the bed and has bilateral  upper extremity contractures limiting mobility as well.  SLP has been attempting to utilize PMV but  patient exquisitely weak and having difficulty tolerating as well.  Ethics physician and myself did have telephonic meeting with patient's sister.  Plans were to allow for children to visit on 7/21 with plans to make a decision about comfort measures after that.  On 7/21 sister asked for additional time to allow other family members to visit with the patient.  Although sister did initiate conversation with home health agency she still remains conflicted about definite discharge plan and transitioning to comfort measures.  Awaiting other family members to visit patient so they can see how poorly he is doing and hopefully with their encouragement she will be able to move forward with recommended plan to transition to comfort measures  Subjective: Awake.  Not as interactive as previous.  Questionably was able to subtly lift finger on left hand when asked.  Objective: Vitals:   04/22/20 0832 04/22/20 1001  BP: (!) 133/79   Pulse: 88 80  Resp: (!) 24 20  Temp: 99 F (37.2 C)   SpO2: 93% 92%    Intake/Output Summary (Last 24 hours) at 04/22/2020 1051 Last data filed at 04/22/2020 0500 Gross per 24 hour  Intake 1632.5 ml  Output 750 ml  Net 882.5 ml   Filed Weights   04/17/20 0730 04/17/20 1113 04/22/20 0454  Weight: 68.2 kg 66.2 kg 69.5 kg    Exam:  Constitutional: NAD, calm and alert today, appears to be mildly cunomfortable along dialysis Neck: midline cuffed trach in place  Respiratory: Bilateral lung sounds coarse on auscultation anteriorly.  Stable, chronic tachypneic but without significant increased work of breathing, trach collar oxygen/FiO2 28% Cardiovascular: Irregular rate with underlying rhythm atrial flutter with controlled ventricular response, no rubs, murmurs thrills or gallops.  Trace upper extremity edema improving.. 2+ pedal pulses. Dialysis catheter right subclavian vein. Abdomen: no tenderness, no masses palpated. Bowel sounds positive.  Colostomy in place with light  brown unformed stool.  Gastrostomy tube with tube feeding GU: Condom catheter in place draining yellow-colored urine to bedside bag Musculoskeletal: no clubbing / cyanosis. No joint deformity upper and lower extremities.  Not spontaneously moving UEs but with PROM has adequate range of motion. Extension contracture involving the right wrist and right elbow.  Also flexion contracture of left hand. Diminished muscle tone throughout all extremities and too weak to follow commands with upper extremities/unable to lift extremities upon command  Skin: no rashes, lesions, sacral decubitus not examined Neurologic: CN 2-12 grossly intact. 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  PCCM following periodically to assess readiness for transition cuffless trach. 7/6: Significant respiratory decompensation with FiO2 increased up to 60%. Aspiration and recurrent Pseudomonas pneumonia as etiology. Of note, family refusing further conversations with palliative medicine team.  Excellent telephonic meeting with ethics physician Dr. Andria Frames on 7/14.  Please see his note for details.  Sister made aware of no meaningful overall recovery including from a pulmonary standpoint. Has completed 8 doses of IV meropenem as of 7/9.   Repeat sputum culture with few MDR Pseudomonas (resistant to imipenem/Carbapenem but sensitive to gentamicin).  Due to mild fever he was  started on antibiotics and has completed a course of Fortaz with [name] as of 7/19  Unfortunately given recurrent pneumonia and increased secretions  with associated generalized weakness/weak cough it is highly doubtful he can transition to cuffless trach at this point.  Overall prognosis is poor.  Continue chest physiotherapy with vibra vest administered per RT  Goals of care: Ethics team meeting on 7/14 as described above. I will be recontacting patient's Sister [name] today prior to 4 PM to determine if family has reached a decision regarding transitioning patient to comfort care. Please see detailed notes from Dr. Andria Frames dated 7/14 **Patient sister contacted  [facility]- goal for contacting home health to determine if EOL care would be possible in the home setting.  Sister still remains conflicted but she is aware that we are contacting hospice in the [place] area in the event and EOL home care discharge plan needs to be in place.  Residential hospice also on the table.  TOC documented family would need to pay for ambulance transport to [facility] 7/25: Sister now has questions regarding whether outpatient hemodialysis can be achieved by transporting patient back and forth to hospital to receive hemodialysis 3 days/week she was also requesting to speak with the nephrologist.  On 7/26 Shane Banks made aware and plans to discuss with patient's Sister [name].  Hypertrophic cardiomyopathy Patient apparently has had this diagnosis for some time. Echocardiogram completed on 7/20 with hyperdynamic EF of 70% with severe asymmetric LV hypertrophy-interpreting cardiologist confirm diagnosis after chart review of outside records  Diarrhea Noninfectious based on testing Continue cholestyramine twice daily and maintenance fluids at 30 cc/h  Mild funguria/oral yeast Urine culture with 30,000 colonies of yeast Completed Diflucan as of 7/19  Sacral osteomyelitis and HD catheter sepsis/  Enterococcal bacteremia: Tunneled catheter removal on 02/21/2020. Has tunneled HD catheter right subclavian-side unremarkable Completed 6 weeks of IV vancomycin and  Flagyl 7/8  Paroxysmal atrial fibrillation and flutter/Acquired thrombophilia: Currently rate controlled on metoprolol Anticoagulation previously discontinued due to suspected GI bleed and was resumed 5/31 and patient has tolerated it any signs of bleeding.  Chronic kidney disease stage V now requiring hemodialysis (mwf) 2/2 ATN and AKI from IV gentamicin: On HD since March 2021 status post tunneled catheter placement 02/28/2020 and as of 7/1 site unremarkable. Unfortunately, this patient continues to decline, he has significant weakness and is unable to tolerate sitting up in a recliner.  Attending nephrologist has documented that patient is not a candidate for outpatient hemodialysis  Chronic normocytic anemia: Due to anemia of chronic renal disease Status post 1 unit of PRBCs Hgb on 7/19 was 8.9 Continue erythropoietin and IV iron per renal.  History of nonhemorrhagic CVA: CT of the head showed atrophy and chronic microvascular disease. 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 Documented as full-thickness tissue loss  Severe protein caloric malnutrition: Tube feedings on hold due to concerns of a possible aspiration as above.  Patient is dialysis patient but will allow D5 normal saline at 30 cc/h to replace tube feeding volume loss Will allow medications per tube with small amounts of flush Nutrition Status: Nutrition Problem: Severe Malnutrition Etiology: chronic illness (chronic respiratory failure related to COVID ARDS) Signs/Symptoms: severe muscle depletion, severe fat depletion Interventions: Prostat, Tube feeding, Juven-continue tube feeding and check residuals every shift       Data Reviewed: Basic Metabolic Panel: Recent Labs  Lab 04/17/20 0558 04/18/20 0245 04/19/20 2458 04/21/20 0329  NA 124* 129* 126* 131*  K 3.8 3.4* 3.6 3.6  CL 92* 97* 93* 99  CO2 23 24 26 23   GLUCOSE  151* 138* 142* 132*  BUN 101* 55* 95* 81*  CREATININE 2.26* 1.61* 2.22* 1.99*  CALCIUM 9.3 8.7* 9.0 9.3  PHOS 3.4 2.3* 3.1 2.3*   Liver Function Tests: Recent Labs  Lab 04/17/20 0558 04/18/20 0245 04/19/20 0822 04/21/20 0329  ALBUMIN 1.8* 1.9* 1.7* 1.7*   No results for input(s): LIPASE, AMYLASE in the last 168 hours. No results for input(s): AMMONIA in the last 168 hours. CBC: Recent Labs  Lab 04/17/20 0558 04/19/20 0825  WBC 17.6* 16.9*  HGB 8.8* 8.3*  HCT 28.6* 27.8*  MCV 75.9* 78.3*  PLT 285 290   Cardiac Enzymes: No results for input(s): CKTOTAL, CKMB, CKMBINDEX, TROPONINI in the last 168 hours. BNP (last 3 results) No results for input(s): BNP in the last 8760 hours.  ProBNP (last 3 results) No results for input(s): PROBNP in the last 8760 hours.  CBG: Recent Labs  Lab 04/21/20 1514 04/21/20 2016 04/21/20 2328 04/22/20 0336 04/22/20 0840  GLUCAP 114* 133* 129* 128* 137*    No results found for this or any previous visit (from the past 240 hour(s)).   Studies: No results found.  Scheduled Meds: . apixaban  2.5 mg Per Tube BID  . chlorhexidine  15 mL Mouth Rinse BID  . Chlorhexidine Gluconate Cloth  6 each Topical Q0600  . cholestyramine  4 g Per Tube BID  . darbepoetin (ARANESP) injection - DIALYSIS  150 mcg Intravenous Q Mon-HD  . feeding supplement (PROSource TF)  45 mL Per Tube TID  . [START ON 04/23/2020] heparin  2,000 Units Dialysis Once in dialysis  . insulin aspart  0-6 Units Subcutaneous Q4H  . insulin aspart  2 Units Subcutaneous Q4H  . levothyroxine  25 mcg Per Tube Q0600  . mouth rinse  15 mL Mouth Rinse q12n4p  . metoprolol tartrate  25 mg Per Tube BID  . nutrition supplement (JUVEN)  1 packet Per Tube BID BM  . pantoprazole sodium  40 mg Per Tube Daily   Continuous Infusions: . sodium chloride    . sodium chloride    . dextrose Stopped (04/21/20 2052)  . feeding supplement (NEPRO CARB STEADY) 1,000 mL (04/22/20 0436)     Principal Problem:   Enterococcal bacteremia Active Problems:   Acute respiratory failure (HCC)   Hemoptysis   Pressure injury of skin   Protein-calorie malnutrition, severe   Decubitus ulcer of sacral region, stage 4 (HCC)   Fever   Palliative care by specialist   Goals of care, counseling/discussion   DNR (do not resuscitate)     Consultants: PCCM Ethics committee/Dr. Andria Frames  Procedures: Echocardiogram 1. Left ventricular ejection fraction, by estimation, is 60 to 65%. The  left ventricle has normal function. The left ventricle has no regional  wall motion abnormalities. There is severe left ventricular hypertrophy.  Left ventricular diastolic parameters  are consistent with Grade I diastolic dysfunction (impaired relaxation).  2. Right ventricular systolic function is normal. The right ventricular  size is normal. There is mildly elevated pulmonary artery systolic  pressure.  3. Left atrial size was mildly dilated.  4. Right atrial size was mildly dilated.  5. The mitral valve is grossly normal. Trivial mitral valve  regurgitation.  6. The aortic valve is tricuspid. Aortic valve regurgitation is trivial.   Antibiotics: Anti-infectives (From admission, onward)   Start  Dose/Rate Route Frequency Ordered Stop   04/12/20 0900  ceftazidime-avibactam (AVYCAZ) 0.94 g in dextrose 5 % 50 mL IVPB  Status:  Discontinued        0.94 g 25 mL/hr over 2 Hours Intravenous Every 24 hours 04/11/20 1225 04/17/20 1034   04/10/20 2100  ceftazidime-avibactam (AVYCAZ) 0.94 g in dextrose 5 % 50 mL IVPB  Status:  Discontinued        0.94 g 25 mL/hr over 2 Hours Intravenous Every 12 hours 04/10/20 2005 04/11/20 1225   04/08/20 1420  ceftazidime-avibactam (AVYCAZ) 0.94 g in dextrose 5 % 50 mL IVPB  Status:  Discontinued        0.94 g 25 mL/hr over 2 Hours Intravenous Daily at bedtime 04/08/20 1420 04/10/20 2005   04/07/20 1800  ceftazidime-avibactam (AVYCAZ) 0.94 g in dextrose  5 % 50 mL IVPB  Status:  Discontinued        0.94 g 25 mL/hr over 2 Hours Intravenous Every 12 hours 04/07/20 1322 04/08/20 1420   04/05/20 1000  fluconazole (DIFLUCAN) 40 MG/ML suspension 100 mg  Status:  Discontinued       "Followed by" Linked Group Details   100 mg Per Tube Daily 04/04/20 2203 04/15/20 1248   04/04/20 2300  fluconazole (DIFLUCAN) 40 MG/ML suspension 200 mg       "Followed by" Linked Group Details   200 mg Per Tube  Once 04/04/20 2203 04/04/20 2325   04/01/20 1013  vancomycin (VANCOCIN) 500-5 MG/100ML-% IVPB       Note to Pharmacy: Cherylann Banas   : cabinet override      04/01/20 1013 04/01/20 1253   03/29/20 1552  vancomycin (VANCOCIN) 500-5 MG/100ML-% IVPB       Note to Pharmacy: Anthonette Legato, Melisa   : cabinet override      03/29/20 1552 03/29/20 1556   03/29/20 1415  fluconazole (DIFLUCAN) 40 MG/ML suspension 100 mg        100 mg Oral Daily 03/29/20 1408 03/31/20 1015   03/28/20 2000  meropenem (MERREM) 500 mg in sodium chloride 0.9 % 100 mL IVPB        500 mg 200 mL/hr over 30 Minutes Intravenous Every 24 hours 03/28/20 1623 04/04/20 2201   03/25/20 1800  cefTAZidime (FORTAZ) 1 g in sodium chloride 0.9 % 100 mL IVPB  Status:  Discontinued       "Followed by" Linked Group Details   1 g 200 mL/hr over 30 Minutes Intravenous Every 24 hours 03/18/20 1015 03/28/20 1613   03/22/20 0830  vancomycin (VANCOREADY) IVPB 500 mg/100 mL        500 mg 100 mL/hr over 60 Minutes Intravenous  Once 03/22/20 0827 03/22/20 1014   03/20/20 1200  vancomycin (VANCOCIN) IVPB 500 mg/100 ml premix        500 mg 100 mL/hr over 60 Minutes Intravenous Every M-W-F (Hemodialysis) 03/19/20 0941 04/03/20 1500   03/18/20 1800  meropenem (MERREM) 500 mg in sodium chloride 0.9 % 100 mL IVPB       "Followed by" Linked Group Details   500 mg 200 mL/hr over 30 Minutes Intravenous Every 24 hours 03/18/20 1015 03/25/20 0026   03/18/20 1200  vancomycin (VANCOCIN) IVPB 500 mg/100 ml premix        500  mg 100 mL/hr over 60 Minutes Intravenous Every M-W-F (Hemodialysis) 03/18/20 0938 03/18/20 1700   03/18/20 1200  cefTAZidime (FORTAZ) 2 g in sodium chloride 0.9 % 100 mL IVPB  Status:  Discontinued  2 g 200 mL/hr over 30 Minutes Intravenous Every Mon (Hemodialysis) 03/18/20 0939 03/18/20 1015   03/07/20 1800  cefTAZidime (FORTAZ) 2 g in sodium chloride 0.9 % 100 mL IVPB  Status:  Discontinued        2 g 200 mL/hr over 30 Minutes Intravenous Every T-Th-Sa (1800) 03/06/20 1255 03/18/20 1015   03/07/20 1200  vancomycin (VANCOCIN) IVPB 500 mg/100 ml premix  Status:  Discontinued        500 mg 100 mL/hr over 60 Minutes Intravenous Every T-Th-Sa (Hemodialysis) 03/06/20 1255 03/19/20 0941   03/04/20 2000  cefTAZidime (FORTAZ) 2 g in sodium chloride 0.9 % 100 mL IVPB  Status:  Discontinued        2 g 200 mL/hr over 30 Minutes Intravenous Every M-W-F (2000) 03/01/20 1136 03/06/20 1255   03/02/20 1200  vancomycin (VANCOCIN) IVPB 500 mg/100 ml premix        500 mg 100 mL/hr over 60 Minutes Intravenous Every T-Th-Sa (Hemodialysis) 03/01/20 1131 03/02/20 1710   03/01/20 1230  vancomycin (VANCOCIN) IVPB 500 mg/100 ml premix  Status:  Discontinued        500 mg 100 mL/hr over 60 Minutes Intravenous Every M-W-F (Hemodialysis) 03/01/20 1131 03/06/20 1255   02/28/20 1110  vancomycin (VANCOREADY) IVPB 500 mg/100 mL        over 60 Minutes  Continuous PRN 02/28/20 1112 02/28/20 1110   02/28/20 1101  vancomycin (VANCOCIN) 1-5 GM/200ML-% IVPB  Status:  Discontinued       Note to Pharmacy: Lytle Butte   : cabinet override      02/28/20 1101 02/28/20 1327   02/27/20 1800  cefTAZidime (FORTAZ) 1 g in sodium chloride 0.9 % 100 mL IVPB        1 g 200 mL/hr over 30 Minutes Intravenous Every 24 hours 02/27/20 0947 03/03/20 1738   02/27/20 1200  vancomycin (VANCOCIN) IVPB 500 mg/100 ml premix        500 mg 100 mL/hr over 60 Minutes Intravenous Once 02/27/20 0829 02/27/20 1343   02/24/20 1800  vancomycin  (VANCOREADY) IVPB 500 mg/100 mL        500 mg 100 mL/hr over 60 Minutes Intravenous  Once 02/24/20 1308 02/24/20 1905   02/22/20 2200  metroNIDAZOLE (FLAGYL) tablet 500 mg  Status:  Discontinued        500 mg Per Tube Every 8 hours 02/22/20 0957 03/30/20 1057   02/22/20 1700  cefTRIAXone (ROCEPHIN) 2 g in sodium chloride 0.9 % 100 mL IVPB  Status:  Discontinued        2 g 200 mL/hr over 30 Minutes Intravenous Every 24 hours 02/22/20 0957 02/27/20 0947   02/22/20 1200  vancomycin (VANCOCIN) IVPB 500 mg/100 ml premix  Status:  Discontinued        500 mg 100 mL/hr over 60 Minutes Intravenous Every T-Th-Sa (Hemodialysis) 02/20/20 1253 02/21/20 1040   02/21/20 1431  vancomycin variable dose per unstable renal function (pharmacist dosing)  Status:  Discontinued         Does not apply See admin instructions 02/21/20 1431 03/01/20 1131   02/21/20 1200  vancomycin (VANCOREADY) IVPB 500 mg/100 mL        500 mg 100 mL/hr over 60 Minutes Intravenous  Once 02/21/20 0956 02/21/20 1253   02/21/20 0714  vancomycin (VANCOCIN) 500-5 MG/100ML-% IVPB  Status:  Discontinued       Note to Pharmacy: Ashley Akin   : cabinet override      02/21/20 [phone]  02/21/20 1346   02/20/20 1400  vancomycin (VANCOREADY) IVPB 1250 mg/250 mL        1,250 mg 166.7 mL/hr over 90 Minutes Intravenous  Once 02/20/20 1253 02/20/20 1538   02/20/20 1200  levofloxacin (LEVAQUIN) IVPB 500 mg  Status:  Discontinued        500 mg 100 mL/hr over 60 Minutes Intravenous Every 48 hours 02/20/20 1020 02/21/20 0943   02/15/20 1545  vancomycin (VANCOCIN) IVPB 1000 mg/200 mL premix  Status:  Discontinued       "Followed by" Linked Group Details   1,000 mg 200 mL/hr over 60 Minutes Intravenous Every 24 hours 02/14/20 1530 02/14/20 1745   02/15/20 0330  ceFEPIme (MAXIPIME) 2 g in sodium chloride 0.9 % 100 mL IVPB  Status:  Discontinued        2 g 200 mL/hr over 30 Minutes Intravenous Every 12 hours 02/14/20 1531 02/14/20 1531   02/14/20 1531   ceFEPIme (MAXIPIME) 2 g in sodium chloride 0.9 % 100 mL IVPB  Status:  Discontinued        2 g 200 mL/hr over 30 Minutes Intravenous Every 12 hours 02/14/20 1531 02/14/20 1745   02/14/20 1530  ceFEPIme (MAXIPIME) 2 g in sodium chloride 0.9 % 100 mL IVPB  Status:  Discontinued        2 g 200 mL/hr over 30 Minutes Intravenous  Once 02/14/20 1520 02/14/20 1531   02/14/20 1530  vancomycin (VANCOREADY) IVPB 1500 mg/300 mL  Status:  Discontinued       "Followed by" Linked Group Details   1,500 mg 150 mL/hr over 120 Minutes Intravenous  Once 02/14/20 1530 02/14/20 1745        Time spent: 20 minutes    Erin Hearing ANP [facility] Pager [phone]. If 7PM-7AM, please contact night-coverage at [email or web address] 04/22/2020, 10:51 AM  LOS: 68 days

## 2020-04-22 NOTE — Procedures (Signed)
Patient seen on Hemodialysis. BP (!) 153/62 (BP Location: Left Arm)   Pulse 73   Temp 99 F (37.2 C)   Resp (!) 24   Ht 5\' 9"  (1.753 m)   Wt 69.5 kg   SpO2 91%   BMI 22.63 kg/m   QB 400, UF goal 2L Tolerating treatment without complaints at this time.   Elmarie Shiley MD [facility]. Office # [phone] Pager # [phone] 2:42 PM

## 2020-04-23 LAB — GLUCOSE, CAPILLARY
Glucose-Capillary: 123 mg/dL — ABNORMAL HIGH (ref 70–99)
Glucose-Capillary: 135 mg/dL — ABNORMAL HIGH (ref 70–99)
Glucose-Capillary: 140 mg/dL — ABNORMAL HIGH (ref 70–99)
Glucose-Capillary: 144 mg/dL — ABNORMAL HIGH (ref 70–99)
Glucose-Capillary: 147 mg/dL — ABNORMAL HIGH (ref 70–99)
Glucose-Capillary: 150 mg/dL — ABNORMAL HIGH (ref 70–99)

## 2020-04-23 MED ORDER — HEPARIN SODIUM (PORCINE) 1000 UNIT/ML DIALYSIS: 40.0000 [IU]/kg | INTRAMUSCULAR | Status: DC | PRN

## 2020-04-23 NOTE — Progress Notes (Signed)
Patient ID: Shane Banks, male   DOB: 01-04-1946, 74 y.o.   MRN: [id number]  [facility] Progress Note   Assessment/ Plan:   1.  Acute on chronic hypoxic respiratory failure: Status post tracheostomy with transfer to the acute hospital after developing some bleeding around tracheostomy site.  He also has a history of recurrent HCAP and has earlier completed antibiotic course with Avycaz. 2. 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  Given his current deconditioning and nutritional needs as well as ongoing tracheostomy care requirements; he is physically suitable for outpatient hemodialysis placement.  I discussed case with his sister Shane Banks yesterday and encouraged a conservative management/comfort based approach over continued measures including dialysis-I will call her back today to help facilitate this decision making. 3. Anemia: Likely secondary to underlying end-stage renal disease and compounded by acute illness/inflammatory component.  Continue ESA. 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 5. Nutrition: Remains n.p.o. and getting tube feeds due to concerns of recurrent aspiration.  Remains cachectic and with moderate to severe protein calorie malnutrition. 6. Hypertension: Blood pressures within acceptable range, continue ultrafiltration with hemodialysis based on I/O balance.  Subjective:   Without reported acute events overnight, tolerated hemodialysis yesterday without problems   Objective:   BP 120/76 (BP Location: Left Arm)   Pulse 89   Temp 98.6 F (37 C) (Axillary)   Resp (!) 30   Ht 5\' 9"  (1.753 m)   Wt 69 kg   SpO2 93%   BMI 22.46 kg/m   Physical Exam: Gen: Chronically ill-appearing and cachectic man  with tracheostomy. Awake/alert. CVS: Pulse regular rhythm, normal rate, S1 and S2 normal Resp: Coarse breath sounds bilaterally, no distinct rales or rhonchi.  Right IJ TDC Abd: Soft, colostomy bag in situ.  Feeding tube in situ Ext: Trace upper extremity edema without lower extremity edema.  Some dependent edema noted over lower back.  Labs: BMET Recent Labs  Lab 04/17/20 0558 04/18/20 0245 04/19/20 0822 04/21/20 0329 04/22/20 1030  NA 124* 129* 126* 131* 128*  K 3.8 3.4* 3.6 3.6 3.6  CL 92* 97* 93* 99 95*  CO2 23 24 26 23 24   GLUCOSE 151* 138* 142* 132* 152*  BUN 101* 55* 95* 81* 123*  CREATININE 2.26* 1.61* 2.22* 1.99* 2.45*  CALCIUM 9.3 8.7* 9.0 9.3 9.6  PHOS 3.4 2.3* 3.1 2.3* 3.6   CBC Recent Labs  Lab 04/17/20 0558 04/19/20 0825 04/22/20 1021  WBC 17.6* 16.9* 15.8*  HGB 8.8* 8.3* 9.3*  HCT 28.6* 27.8* 30.2*  MCV 75.9* 78.3* 75.5*  PLT 285 290 280      Medications:    . apixaban  2.5 mg Per Tube BID  . chlorhexidine  15 mL Mouth Rinse BID  . Chlorhexidine Gluconate Cloth  6 each Topical Q0600  . cholestyramine  4 g Per Tube BID  . darbepoetin (ARANESP) injection - DIALYSIS  150 mcg Intravenous Q Mon-HD  . feeding supplement (PROSource TF)  45 mL Per Tube TID  . insulin aspart  0-6 Units Subcutaneous Q4H  . insulin aspart  2 Units Subcutaneous Q4H  . levothyroxine  25 mcg Per Tube Q0600  . mouth rinse  15 mL Mouth Rinse q12n4p  . metoprolol tartrate  25 mg Per Tube BID  . nutrition supplement (JUVEN)  1 packet Per Tube BID BM  . pantoprazole sodium  40 mg Per Tube Daily   Elmarie Shiley, MD 04/23/2020, 8:56 AM

## 2020-04-23 NOTE — Progress Notes (Signed)
[facility] PROGRESS NOTE  Shane Banks KDX:[id number] DOB: [birth year] DOA: 02/14/2020 PCP: Shane Border, MD  Status: Inpatient--Remains inpatient appropriate because :Unsafe d/c plan, IV treatments appropriate due to intensity of illness or inability to take PO and Inpatient level of care appropriate due to severity of illness-patient continues to have tenuous respiratory status   Dispo:  Patient From:  Home  Planned Disposition:    Expected discharge date:    Medically stable for discharge: No-needs to transition to cuffless trach (respiratory status too tenuous to accomplish), needs to be able to sit upright in recliner to pursue dialysis in the outpatient setting (profoundly physically deconditioned and unable to even lift arms at this juncture),   7/20; spoke with Shane Banks.  Agreed to delay decision making regarding comfort measures until all family has had opportunity to come visit with patient.  Also briefly discussed death and dying in the dialysis patient and reassured her that we would continue dialysis right up until transport to ensure patient comfort and stability for transport.  Attending physician over the weekend stated he had also spoken with patient's sister who had questions regarding whether outpatient hemodialysis could be achieved by transporting the patient back and forth to the hospital.  She was also requesting to speak with nephrology on 7/26 nephrologist Shane Banks spoke with patient's sister and confirmed rationale for why he is not an appropriate candidate for outpatient hemodialysis.  Plans to recontact sister on 7/27 to see if there are additional questions from the rest of the family.  Code Status: Full Family Communication: Shane Banks sister 7/14 and 04/11/20-VM left 7/23 re: any further family visits planned before decision made about comfort measures, brother Shane Banks on 04/02/20; DVT prophylaxis: Eliquis  HPI: 74 y.o. 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 Patient treated for Covid pneumonia January 2021 and had been on the vent because of progressive ARDS.  Because of lack of progression he underwent tracheostomy and was [facility] 2021.  Was sent back to this facility in May due to trach site bleeding suspected hemoptysis.  He was also noted with sacral decubitus at time of presentation in May.  Since admission he has been treated for recurrent multidrug-resistant Pseudomonas pneumonia and MRSA and enterococcal bacteremia.  On 03/15/2020 patient returned to the ICU for ventilatory and pressor support he developed septic shock and developed acute renal failure requiring dialysis.  He developed sepsis physiology likely secondary to sacral decubitus ulcer with underlying osteomyelitis.    He has been weaned from pressors. PCCM is following for trach weaning with the assistance of SLP.  Patient was liberated from the vent on 03/21/2020 and was transferred to the floor in 03/22/2020.  Due to the lack of insurance approval he cannot go back to [facility].  Social worker is working on Clinical research associate.  He will need to be transition to a cuffless trach which unfortunately has not been accomplished due to recurrent [name] with respiratory failure physiology.  He has been deemed to not be a candidate for outpatient hemodialysis by the nephrologist.  Patient has not made any progress in mobility OT and PT have signed off.  Several weeks ago he had issues regarding aspiration pneumonitis on top of multidrug-resistant Pseudomonas pneumonia that has  been treated with [name]  and [name].  He continues to remain quite weak and  is unable to lift arms off the bed and has bilateral upper extremity contractures limiting mobility as well.  SLP has been attempting to utilize PMV but patient exquisitely weak and having difficulty tolerating as well.  Ethics physician and myself did have telephonic meeting with patient's sister.  Plans were to allow for children to visit on 7/21 with plans to make a decision about comfort measures after that.  On 7/21 sister asked for additional time to allow other family members to visit with the patient.  Although sister did initiate conversation with home health agency she still remains conflicted about definite discharge plan and transitioning to comfort measures.  Awaiting other family members to visit patient so they can see how poorly he is doing and hopefully with their encouragement she will be able to move forward with recommended plan to transition to comfort measures  Subjective: Awake.  More interactive today post hemodialysis the previous day. Able to lift first finger on left hand.  Nodded that he was "okay" and attempted to mouth words.  Limited to simple conversations.  Objective: Vitals:   04/23/20 0800 04/23/20 0805  BP: 120/76   Pulse: 88 89  Resp: (!) 31 (!) 30  Temp:    SpO2: 94% 93%    Intake/Output Summary (Last 24 hours) at 04/23/2020 1002 Last data filed at 04/22/2020 2100 Gross per 24 hour  Intake 250 ml  Output 2497 ml  Net -2247 ml   Filed Weights   04/17/20 1113 04/22/20 0454 04/23/20 0328  Weight: 66.2 kg 69.5 kg 69 kg    Exam:  Constitutional: NAD, calm and alert today, appears to be mildly cunomfortable along dialysis Neck: midline cuffed trach in place  Respiratory: Bilateral lung sounds clear to auscultation anteriorly. Chronic tachypneic, no increased work of breathing, trach collar oxygen/FiO2 28% Cardiovascular: Irregular rate with underlying rhythm atrial flutter with controlled ventricular response, no rubs, murmurs thrills or gallops.   Postdialysis upper extremity edema has resolved.  Low rate IV fluids have been discontinued.  2+ pedal pulses. Dialysis catheter right subclavian vein. Abdomen: no tenderness, no masses palpated. Bowel sounds positive.  Colostomy in place with light reddish brown unformed stool.  Gastrostomy tube with tube feeding GU: Condom catheter in place draining yellow-colored urine to bedside bag Musculoskeletal: no clubbing / cyanosis. No joint deformity upper and lower extremities.  Not spontaneously moving UEs but with PROM has adequate range of motion. Extension contracture involving the right wrist and right elbow.  Also flexion contracture of left hand. Diminished muscle tone throughout all extremities and too weak to follow commands with upper extremities/unable to lift extremities upon command  Skin: no rashes, lesions, sacral decubitus not examined Neurologic: CN 2-12 grossly intact. 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  PCCM following periodically to assess readiness for transition cuffless trach. 7/6: Significant respiratory decompensation with FiO2 increased up to 60%. Aspiration and recurrent Pseudomonas pneumonia as etiology. Of note, family refusing further conversations with palliative medicine team.  Excellent telephonic meeting with ethics physician Dr. Andria Frames on 7/14.  Please see his note for details.  Sister made aware  of no meaningful overall recovery including from a pulmonary standpoint. Has completed 8 doses of IV meropenem as of 7/9.   Repeat sputum culture with  few MDR Pseudomonas (resistant to imipenem/Carbapenem but sensitive to gentamicin).  Due to mild fever he was started on antibiotics and has completed a course of Fortaz with [name] as of 7/19  Unfortunately given recurrent pneumonia and increased secretions with associated generalized weakness/weak cough it is highly doubtful he can transition to cuffless trach at this point.  Overall prognosis is poor.  Continue chest physiotherapy with vibra vest administered per RT  Goals of care: Ethics team meeting on 7/14 as described above. I will be recontacting patient's Sister [name] today prior to 4 PM to determine if family has reached a decision regarding transitioning patient to comfort care. Please see detailed notes from Dr. Andria Frames dated 7/14 **Patient sister contacted  [facility]- goal for contacting home health to determine if EOL care would be possible in the home setting.  Sister still remains conflicted but she is aware that we are contacting hospice in the [place] area in the event and EOL home care discharge plan needs to be in place.  Residential hospice also on the table.  TOC documented family would need to pay for ambulance transport to [facility] 7/25: Sister now has questions regarding whether outpatient hemodialysis can be achieved by transporting patient back and forth to hospital to receive hemodialysis 3 days/week she was also requesting to speak with the nephrologist.  On 7/26 Shane Banks also spoke with patient's sister regarding and appropriateness for outpatient hemodialysis and plans are to recontact her on 7/27 to determine if other family members have additional questions.  Hypertrophic cardiomyopathy Patient apparently has had this diagnosis for some time. Echocardiogram completed on 7/20 with hyperdynamic EF of 70% with severe asymmetric LV hypertrophy-interpreting cardiologist confirm diagnosis after chart review of outside records  Diarrhea Noninfectious based on  testing Continue cholestyramine twice daily   Mild funguria/oral yeast Urine culture with 30,000 colonies of yeast Completed Diflucan as of 7/19  Sacral osteomyelitis and HD catheter sepsis/  Enterococcal bacteremia: Tunneled catheter removal on 02/21/2020. Has tunneled HD catheter right subclavian-side unremarkable Completed 6 weeks of IV vancomycin and Flagyl 7/8  Paroxysmal atrial fibrillation and flutter/Acquired thrombophilia: Currently rate controlled on metoprolol Anticoagulation previously discontinued due to suspected GI bleed and was resumed 5/31 and patient has tolerated it any signs of bleeding.  Chronic kidney disease stage V now requiring hemodialysis (mwf) 2/2 ATN and AKI from IV gentamicin: On HD since March 2021 status post tunneled catheter placement 02/28/2020 and as of 7/1 site unremarkable. Unfortunately, this patient continues to decline, he has significant weakness and is unable to tolerate sitting up in a recliner.  Attending nephrologist has documented that patient is not a candidate for outpatient hemodialysis  Chronic normocytic anemia: Due to anemia of chronic renal disease Status post 1 unit of PRBCs Hgb on 7/26 was 9.3 Continue erythropoietin and IV iron per renal.  History of nonhemorrhagic CVA: CT of the head showed atrophy and chronic microvascular disease. 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 Documented as full-thickness tissue loss  Severe protein caloric malnutrition: Tube feedings on hold due to concerns of a possible aspiration as above.  Patient is dialysis patient but will allow D5 normal saline at 30 cc/h to replace tube feeding volume loss Will allow medications per tube with small amounts of flush Nutrition Status: Nutrition  Problem: Severe Malnutrition Etiology: chronic illness (chronic respiratory failure related to COVID ARDS) Signs/Symptoms:  severe muscle depletion, severe fat depletion Interventions: Prostat, Tube feeding, Juven-continue tube feeding and check residuals every shift       Data Reviewed: Basic Metabolic Panel: Recent Labs  Lab 04/17/20 0558 04/18/20 0245 04/19/20 0822 04/21/20 0329 04/22/20 1030  NA 124* 129* 126* 131* 128*  K 3.8 3.4* 3.6 3.6 3.6  CL 92* 97* 93* 99 95*  CO2 23 24 26 23 24   GLUCOSE 151* 138* 142* 132* 152*  BUN 101* 55* 95* 81* 123*  CREATININE 2.26* 1.61* 2.22* 1.99* 2.45*  CALCIUM 9.3 8.7* 9.0 9.3 9.6  PHOS 3.4 2.3* 3.1 2.3* 3.6   Liver Function Tests: Recent Labs  Lab 04/17/20 0558 04/18/20 0245 04/19/20 0822 04/21/20 0329 04/22/20 1030  ALBUMIN 1.8* 1.9* 1.7* 1.7* 1.8*   No results for input(s): LIPASE, AMYLASE in the last 168 hours. No results for input(s): AMMONIA in the last 168 hours. CBC: Recent Labs  Lab 04/17/20 0558 04/19/20 0825 04/22/20 1021  WBC 17.6* 16.9* 15.8*  HGB 8.8* 8.3* 9.3*  HCT 28.6* 27.8* 30.2*  MCV 75.9* 78.3* 75.5*  PLT 285 290 280   Cardiac Enzymes: No results for input(s): CKTOTAL, CKMB, CKMBINDEX, TROPONINI in the last 168 hours. BNP (last 3 results) No results for input(s): BNP in the last 8760 hours.  ProBNP (last 3 results) No results for input(s): PROBNP in the last 8760 hours.  CBG: Recent Labs  Lab 04/22/20 1842 04/22/20 1944 04/22/20 2254 04/23/20 0318 04/23/20 0751  GLUCAP 125* 141* 137* 123* 144*    No results found for this or any previous visit (from the past 240 hour(s)).   Studies: No results found.  Scheduled Meds: . apixaban  2.5 mg Per Tube BID  . chlorhexidine  15 mL Mouth Rinse BID  . Chlorhexidine Gluconate Cloth  6 each Topical Q0600  . cholestyramine  4 g Per Tube BID  . darbepoetin (ARANESP) injection - DIALYSIS  150 mcg Intravenous Q Mon-HD  . feeding supplement (PROSource TF)  45 mL Per Tube TID  . insulin aspart  0-6 Units Subcutaneous Q4H  . insulin aspart  2 Units Subcutaneous Q4H   . levothyroxine  25 mcg Per Tube Q0600  . mouth rinse  15 mL Mouth Rinse q12n4p  . metoprolol tartrate  25 mg Per Tube BID  . nutrition supplement (JUVEN)  1 packet Per Tube BID BM  . pantoprazole sodium  40 mg Per Tube Daily   Continuous Infusions: . dextrose Stopped (04/21/20 2052)  . feeding supplement (NEPRO CARB STEADY) 1,000 mL (04/23/20 0242)    Principal Problem:   Enterococcal bacteremia Active Problems:   Acute respiratory failure (HCC)   Hemoptysis   Pressure injury of skin   Protein-calorie malnutrition, severe   Decubitus ulcer of sacral region, stage 4 (HCC)   Fever   Palliative care by specialist   Goals of care, counseling/discussion   DNR (do not resuscitate)   Tracheostomy status [facility])     Consultants: PCCM Ethics committee/Dr. Andria Frames Nephrology  Procedures: Echocardiogram 1. Left ventricular ejection fraction, by estimation, is 60 to 65%. The  left ventricle has normal function. The left ventricle has no regional  wall motion abnormalities. There is severe left ventricular hypertrophy.  Left ventricular diastolic parameters  are consistent with Grade I diastolic dysfunction (impaired relaxation).  2. Right ventricular systolic function is normal. The right ventricular  size is normal. There is mildly elevated  pulmonary artery systolic  pressure.  3. Left atrial size was mildly dilated.  4. Right atrial size was mildly dilated.  5. The mitral valve is grossly normal. Trivial mitral valve  regurgitation.  6. The aortic valve is tricuspid. Aortic valve regurgitation is trivial.   Antibiotics: Anti-infectives (From admission, onward)   Start     Dose/Rate Route Frequency Ordered Stop   04/12/20 0900  ceftazidime-avibactam (AVYCAZ) 0.94 g in dextrose 5 % 50 mL IVPB  Status:  Discontinued        0.94 g 25 mL/hr over 2 Hours Intravenous Every 24 hours 04/11/20 1225 04/17/20 1034   04/10/20 2100  ceftazidime-avibactam (AVYCAZ) 0.94 g in  dextrose 5 % 50 mL IVPB  Status:  Discontinued        0.94 g 25 mL/hr over 2 Hours Intravenous Every 12 hours 04/10/20 2005 04/11/20 1225   04/08/20 1420  ceftazidime-avibactam (AVYCAZ) 0.94 g in dextrose 5 % 50 mL IVPB  Status:  Discontinued        0.94 g 25 mL/hr over 2 Hours Intravenous Daily at bedtime 04/08/20 1420 04/10/20 2005   04/07/20 1800  ceftazidime-avibactam (AVYCAZ) 0.94 g in dextrose 5 % 50 mL IVPB  Status:  Discontinued        0.94 g 25 mL/hr over 2 Hours Intravenous Every 12 hours 04/07/20 1322 04/08/20 1420   04/05/20 1000  fluconazole (DIFLUCAN) 40 MG/ML suspension 100 mg  Status:  Discontinued       "Followed by" Linked Group Details   100 mg Per Tube Daily 04/04/20 2203 04/15/20 1248   04/04/20 2300  fluconazole (DIFLUCAN) 40 MG/ML suspension 200 mg       "Followed by" Linked Group Details   200 mg Per Tube  Once 04/04/20 2203 04/04/20 2325   04/01/20 1013  vancomycin (VANCOCIN) 500-5 MG/100ML-% IVPB       Note to Pharmacy: Cherylann Banas   : cabinet override      04/01/20 1013 04/01/20 1253   03/29/20 1552  vancomycin (VANCOCIN) 500-5 MG/100ML-% IVPB       Note to Pharmacy: Anthonette Legato, Melisa   : cabinet override      03/29/20 1552 03/29/20 1556   03/29/20 1415  fluconazole (DIFLUCAN) 40 MG/ML suspension 100 mg        100 mg Oral Daily 03/29/20 1408 03/31/20 1015   03/28/20 2000  meropenem (MERREM) 500 mg in sodium chloride 0.9 % 100 mL IVPB        500 mg 200 mL/hr over 30 Minutes Intravenous Every 24 hours 03/28/20 1623 04/04/20 2201   03/25/20 1800  cefTAZidime (FORTAZ) 1 g in sodium chloride 0.9 % 100 mL IVPB  Status:  Discontinued       "Followed by" Linked Group Details   1 g 200 mL/hr over 30 Minutes Intravenous Every 24 hours 03/18/20 1015 03/28/20 1613   03/22/20 0830  vancomycin (VANCOREADY) IVPB 500 mg/100 mL        500 mg 100 mL/hr over 60 Minutes Intravenous  Once 03/22/20 0827 03/22/20 1014   03/20/20 1200  vancomycin (VANCOCIN) IVPB 500 mg/100 ml  premix        500 mg 100 mL/hr over 60 Minutes Intravenous Every M-W-F (Hemodialysis) 03/19/20 0941 04/03/20 1500   03/18/20 1800  meropenem (MERREM) 500 mg in sodium chloride 0.9 % 100 mL IVPB       "Followed by" Linked Group Details   500 mg 200 mL/hr over 30 Minutes Intravenous Every 24 hours  03/18/20 1015 03/25/20 0026   03/18/20 1200  vancomycin (VANCOCIN) IVPB 500 mg/100 ml premix        500 mg 100 mL/hr over 60 Minutes Intravenous Every M-W-F (Hemodialysis) 03/18/20 0938 03/18/20 1700   03/18/20 1200  cefTAZidime (FORTAZ) 2 g in sodium chloride 0.9 % 100 mL IVPB  Status:  Discontinued        2 g 200 mL/hr over 30 Minutes Intravenous Every Mon (Hemodialysis) 03/18/20 0939 03/18/20 1015   03/07/20 1800  cefTAZidime (FORTAZ) 2 g in sodium chloride 0.9 % 100 mL IVPB  Status:  Discontinued        2 g 200 mL/hr over 30 Minutes Intravenous Every T-Th-Sa (1800) 03/06/20 1255 03/18/20 1015   03/07/20 1200  vancomycin (VANCOCIN) IVPB 500 mg/100 ml premix  Status:  Discontinued        500 mg 100 mL/hr over 60 Minutes Intravenous Every T-Th-Sa (Hemodialysis) 03/06/20 1255 03/19/20 0941   03/04/20 2000  cefTAZidime (FORTAZ) 2 g in sodium chloride 0.9 % 100 mL IVPB  Status:  Discontinued        2 g 200 mL/hr over 30 Minutes Intravenous Every M-W-F (2000) 03/01/20 1136 03/06/20 1255   03/02/20 1200  vancomycin (VANCOCIN) IVPB 500 mg/100 ml premix        500 mg 100 mL/hr over 60 Minutes Intravenous Every T-Th-Sa (Hemodialysis) 03/01/20 1131 03/02/20 1710   03/01/20 1230  vancomycin (VANCOCIN) IVPB 500 mg/100 ml premix  Status:  Discontinued        500 mg 100 mL/hr over 60 Minutes Intravenous Every M-W-F (Hemodialysis) 03/01/20 1131 03/06/20 1255   02/28/20 1110  vancomycin (VANCOREADY) IVPB 500 mg/100 mL        over 60 Minutes  Continuous PRN 02/28/20 1112 02/28/20 1110   02/28/20 1101  vancomycin (VANCOCIN) 1-5 GM/200ML-% IVPB  Status:  Discontinued       Note to Pharmacy: Lytle Butte   :  cabinet override      02/28/20 1101 02/28/20 1327   02/27/20 1800  cefTAZidime (FORTAZ) 1 g in sodium chloride 0.9 % 100 mL IVPB        1 g 200 mL/hr over 30 Minutes Intravenous Every 24 hours 02/27/20 0947 03/03/20 1738   02/27/20 1200  vancomycin (VANCOCIN) IVPB 500 mg/100 ml premix        500 mg 100 mL/hr over 60 Minutes Intravenous Once 02/27/20 0829 02/27/20 1343   02/24/20 1800  vancomycin (VANCOREADY) IVPB 500 mg/100 mL        500 mg 100 mL/hr over 60 Minutes Intravenous  Once 02/24/20 1308 02/24/20 1905   02/22/20 2200  metroNIDAZOLE (FLAGYL) tablet 500 mg  Status:  Discontinued        500 mg Per Tube Every 8 hours 02/22/20 0957 03/30/20 1057   02/22/20 1700  cefTRIAXone (ROCEPHIN) 2 g in sodium chloride 0.9 % 100 mL IVPB  Status:  Discontinued        2 g 200 mL/hr over 30 Minutes Intravenous Every 24 hours 02/22/20 0957 02/27/20 0947   02/22/20 1200  vancomycin (VANCOCIN) IVPB 500 mg/100 ml premix  Status:  Discontinued        500 mg 100 mL/hr over 60 Minutes Intravenous Every T-Th-Sa (Hemodialysis) 02/20/20 1253 02/21/20 1040   02/21/20 1431  vancomycin variable dose per unstable renal function (pharmacist dosing)  Status:  Discontinued         Does not apply See admin instructions 02/21/20 1431 03/01/20 1131   02/21/20  1200  vancomycin (VANCOREADY) IVPB 500 mg/100 mL        500 mg 100 mL/hr over 60 Minutes Intravenous  Once 02/21/20 0956 02/21/20 1253   02/21/20 0714  vancomycin (VANCOCIN) 500-5 MG/100ML-% IVPB  Status:  Discontinued       Note to Pharmacy: Ashley Akin   : cabinet override      02/21/20 0714 02/21/20 1346   02/20/20 1400  vancomycin (VANCOREADY) IVPB 1250 mg/250 mL        1,250 mg 166.7 mL/hr over 90 Minutes Intravenous  Once 02/20/20 1253 02/20/20 1538   02/20/20 1200  levofloxacin (LEVAQUIN) IVPB 500 mg  Status:  Discontinued        500 mg 100 mL/hr over 60 Minutes Intravenous Every 48 hours 02/20/20 1020 02/21/20 0943   02/15/20 1545  vancomycin  (VANCOCIN) IVPB 1000 mg/200 mL premix  Status:  Discontinued       "Followed by" Linked Group Details   1,000 mg 200 mL/hr over 60 Minutes Intravenous Every 24 hours 02/14/20 1530 02/14/20 1745   02/15/20 0330  ceFEPIme (MAXIPIME) 2 g in sodium chloride 0.9 % 100 mL IVPB  Status:  Discontinued        2 g 200 mL/hr over 30 Minutes Intravenous Every 12 hours 02/14/20 1531 02/14/20 1531   02/14/20 1531  ceFEPIme (MAXIPIME) 2 g in sodium chloride 0.9 % 100 mL IVPB  Status:  Discontinued        2 g 200 mL/hr over 30 Minutes Intravenous Every 12 hours 02/14/20 1531 02/14/20 1745   02/14/20 1530  ceFEPIme (MAXIPIME) 2 g in sodium chloride 0.9 % 100 mL IVPB  Status:  Discontinued        2 g 200 mL/hr over 30 Minutes Intravenous  Once 02/14/20 1520 02/14/20 1531   02/14/20 1530  vancomycin (VANCOREADY) IVPB 1500 mg/300 mL  Status:  Discontinued       "Followed by" Linked Group Details   1,500 mg 150 mL/hr over 120 Minutes Intravenous  Once 02/14/20 1530 02/14/20 1745        Time spent: 20 minutes    Erin Hearing ANP [facility] Pager [phone]. If 7PM-7AM, please contact night-coverage at [email or web address] 04/23/2020, 10:02 AM  LOS: 69 days

## 2020-04-23 NOTE — Progress Notes (Signed)
Called to patients room patient had desating issue as well as increased wob.  Switched patient to 28%$ ATC and suctioned patient on full suction in order to retrieve thick secretions from trach.  Patients sp02 increased and wob decreased RT will continue to monitor patients progress throughout the night.

## 2020-04-24 ENCOUNTER — Inpatient Hospital Stay (HOSPITAL_COMMUNITY): Payer: Medicare HMO

## 2020-04-24 DIAGNOSIS — J9600 Acute respiratory failure, unspecified whether with hypoxia or hypercapnia: Secondary | ICD-10-CM | POA: Diagnosis not present

## 2020-04-24 DIAGNOSIS — Z515 Encounter for palliative care: Secondary | ICD-10-CM

## 2020-04-24 DIAGNOSIS — R7881 Bacteremia: Secondary | ICD-10-CM | POA: Diagnosis not present

## 2020-04-24 DIAGNOSIS — B952 Enterococcus as the cause of diseases classified elsewhere: Secondary | ICD-10-CM | POA: Diagnosis not present

## 2020-04-24 LAB — BLOOD GAS, ARTERIAL
Acid-Base Excess: 2.8 mmol/L — ABNORMAL HIGH (ref 0.0–2.0)
Bicarbonate: 27.7 mmol/L (ref 20.0–28.0)
Drawn by: 519031
FIO2: 28
O2 Saturation: 97.2 %
Patient temperature: 37
pCO2 arterial: 49.1 mmHg — ABNORMAL HIGH (ref 32.0–48.0)
pH, Arterial: 7.369 (ref 7.350–7.450)
pO2, Arterial: 89.2 mmHg (ref 83.0–108.0)

## 2020-04-24 LAB — RENAL FUNCTION PANEL
Albumin: 1.9 g/dL — ABNORMAL LOW (ref 3.5–5.0)
Anion gap: 9 (ref 5–15)
BUN: 91 mg/dL — ABNORMAL HIGH (ref 8–23)
CO2: 26 mmol/L (ref 22–32)
Calcium: 9.6 mg/dL (ref 8.9–10.3)
Chloride: 98 mmol/L (ref 98–111)
Creatinine, Ser: 2.08 mg/dL — ABNORMAL HIGH (ref 0.61–1.24)
GFR calc Af Amer: 36 mL/min — ABNORMAL LOW (ref >60)
GFR calc non Af Amer: 31 mL/min — ABNORMAL LOW (ref >60)
Glucose, Bld: 153 mg/dL — ABNORMAL HIGH (ref 70–99)
Phosphorus: 3.1 mg/dL (ref 2.5–4.6)
Potassium: 4.2 mmol/L (ref 3.5–5.1)
Sodium: 133 mmol/L — ABNORMAL LOW (ref 135–145)

## 2020-04-24 LAB — CBC
HCT: 32.7 % — ABNORMAL LOW (ref 39.0–52.0)
Hemoglobin: 9.8 g/dL — ABNORMAL LOW (ref 13.0–17.0)
MCH: 23 pg — ABNORMAL LOW (ref 26.0–34.0)
MCHC: 30 g/dL (ref 30.0–36.0)
MCV: 76.8 fL — ABNORMAL LOW (ref 80.0–100.0)
Platelets: 310 10*3/uL (ref 150–400)
RBC: 4.26 MIL/uL (ref 4.22–5.81)
RDW: 17.5 % — ABNORMAL HIGH (ref 11.5–15.5)
WBC: 13.9 10*3/uL — ABNORMAL HIGH (ref 4.0–10.5)
nRBC: 0.1 % (ref 0.0–0.2)

## 2020-04-24 LAB — GLUCOSE, CAPILLARY
Glucose-Capillary: 123 mg/dL — ABNORMAL HIGH (ref 70–99)
Glucose-Capillary: 124 mg/dL — ABNORMAL HIGH (ref 70–99)
Glucose-Capillary: 133 mg/dL — ABNORMAL HIGH (ref 70–99)

## 2020-04-24 MED ORDER — HALOPERIDOL LACTATE 2 MG/ML PO CONC
0.5000 mg | ORAL | Status: DC | PRN
  Filled 2020-04-24: qty 0.3

## 2020-04-24 MED ORDER — ONDANSETRON 4 MG PO TBDP: 4.0000 mg | ORAL_TABLET | Freq: Four times a day (QID) | ORAL | Status: DC | PRN

## 2020-04-24 MED ORDER — LORAZEPAM 2 MG/ML IJ SOLN
0.5000 mg | Freq: Two times a day (BID) | INTRAMUSCULAR | Status: DC
  Administered 2020-04-25 – 2020-04-26 (×3): 0.5 mg via INTRAVENOUS
  Filled 2020-04-24 (×4): qty 1

## 2020-04-24 MED ORDER — LORAZEPAM 2 MG/ML IJ SOLN
0.5000 mg | Freq: Four times a day (QID) | INTRAMUSCULAR | Status: DC | PRN
  Administered 2020-04-24 – 2020-04-26 (×2): 0.5 mg via INTRAVENOUS
  Filled 2020-04-24 (×2): qty 1

## 2020-04-24 MED ORDER — HALOPERIDOL 0.5 MG PO TABS: 0.5000 mg | ORAL_TABLET | ORAL | Status: DC | PRN

## 2020-04-24 MED ORDER — GLYCOPYRROLATE 0.2 MG/ML IJ SOLN: 0.2000 mg | INTRAMUSCULAR | Status: DC | PRN

## 2020-04-24 MED ORDER — HYDROMORPHONE HCL 1 MG/ML IJ SOLN
0.5000 mg | Freq: Two times a day (BID) | INTRAMUSCULAR | Status: DC | PRN
  Administered 2020-04-24 – 2020-04-26 (×2): 1 mg via INTRAVENOUS
  Filled 2020-04-24 (×2): qty 1

## 2020-04-24 MED ORDER — HYDROMORPHONE HCL 1 MG/ML IJ SOLN: 0.5000 mg | INTRAMUSCULAR | Status: DC | PRN

## 2020-04-24 MED ORDER — BIOTENE DRY MOUTH MT LIQD: 15.0000 mL | OROMUCOSAL | Status: DC | PRN

## 2020-04-24 MED ORDER — ONDANSETRON HCL 4 MG/2ML IJ SOLN: 4.0000 mg | Freq: Four times a day (QID) | INTRAMUSCULAR | Status: DC | PRN

## 2020-04-24 MED ORDER — HEPARIN SODIUM (PORCINE) 1000 UNIT/ML IJ SOLN
INTRAMUSCULAR | Status: AC
  Administered 2020-04-24: 1000 [IU]
  Filled 2020-04-24: qty 4

## 2020-04-24 MED ORDER — HALOPERIDOL LACTATE 5 MG/ML IJ SOLN: 0.5000 mg | INTRAMUSCULAR | Status: DC | PRN

## 2020-04-24 MED ORDER — NEPRO/CARBSTEADY PO LIQD
1000.0000 mL | ORAL | Status: DC
  Filled 2020-04-24: qty 1000

## 2020-04-24 MED ORDER — POLYVINYL ALCOHOL 1.4 % OP SOLN
1.0000 [drp] | Freq: Four times a day (QID) | OPHTHALMIC | Status: DC | PRN
  Filled 2020-04-24: qty 15

## 2020-04-24 MED ORDER — GLYCOPYRROLATE 1 MG PO TABS: 1.0000 mg | ORAL_TABLET | ORAL | Status: DC | PRN

## 2020-04-24 MED ORDER — GLYCOPYRROLATE 0.2 MG/ML IJ SOLN
0.3000 mg | Freq: Two times a day (BID) | INTRAMUSCULAR | Status: DC
  Administered 2020-04-24 – 2020-04-26 (×5): 0.3 mg via INTRAVENOUS
  Filled 2020-04-24 (×4): qty 2

## 2020-04-24 MED ORDER — GLYCOPYRROLATE 0.2 MG/ML IJ SOLN
0.2000 mg | INTRAMUSCULAR | Status: DC | PRN
  Administered 2020-04-26: 0.1 mg via INTRAVENOUS
  Filled 2020-04-24 (×2): qty 1

## 2020-04-24 NOTE — Progress Notes (Signed)
Received call from Dr. Sloan Leiter regarding decline in patient's condition.  Chart reviewed. Patient examined.  Patient appears lethargic and profoundly weak.  He does not speak but attempts to answer a couple of questions by nodding his head.  He is breathing 55x a minute on monitor.  Lung sounds are wet.  Heart rate is irregular and rapid.  Per bedside RN who has previous experience with him the patient is declining.  Talked with [name] on the phone.  I stated that Shane Banks is not doing well and I attempted to gain her understanding of his condition.  [name] asked for a family conversation as she states "I'm not thinking clearly right now and I want my family to hear it".   Conference call is scheduled for 10:00 on 7/29 with multiple family members.  Discussed symptom management briefly with Erin Hearing, NP  Florentina Jenny, PA-C Palliative Medicine Office:  [phone]  25 min. Greater than 50%  of this time was spent counseling and coordinating care related to the above assessment and plan.

## 2020-04-24 NOTE — Procedures (Signed)
Patient seen on Hemodialysis. BP (!) 137/82   Pulse 76   Temp 98.7 F (37.1 C) (Axillary)   Resp (!) 38   Ht 5\' 9"  (1.753 m)   Wt 66.9 kg   SpO2 99%   BMI 21.78 kg/m   QB 400, UF goal 2L Tolerating treatment without complaints at this time.  Elmarie Shiley MD [facility]. Office # [phone]  9:55 AM

## 2020-04-24 NOTE — Progress Notes (Signed)
Seen and examined this morning-chart reviewed. He was very tachypneic when I walked into his room-per respiratory therapist/nursing staff-has required very frequent suctioning-he literally is very frail and weak and appears to be aspirating his secretions actively.  Over the course of this morning-he has had significant episodes of tachypnea requiring aspiration.  Patient is clearly at end-of-life-and has been deemed not to be a candidate for outpatient hemodialysis-furthermore-he appears to be significantly symptomatic-and clearly suffering with these aspiration episodes.  Erin Hearing, NP, and myself-subsequently had a telephonic a phone conference with the patient's sister, and patient's daughter-we explained that patient is clearly deteriorating-and that further invasive treatment with mechanical ventilation, CPR would not really change outcome or management.  I explained to the family that at this point-it is clear that the patient is suffering-and needed not only suctioning-but also as needed narcotics/benzodiazepines to keep him comfortable when he is deteriorating/tachypneic.    After extensive discussion-family understood the concept of DNR-I emphasized-that we would not withdraw any care at this point but in the near [place] him actively aspirating and slowly deteriorating-that inpatient dialysis may not be an option.  I encouraged them to have family discussion about transitioning to full comfort measures-to start aggressive symptom control to alleviate some of his suffering-as it is clear that prolonged hospitalization-numerous rounds of antibiotics/mechanical ventilation/ICU care has really not helped him at this point.  Family to continue discussion-I have ordered a DNR-I have reached out to Dr. Patel-nephrology on call-he agrees with above.  I have subsequently reached out to our palliative care team-Marriane York to continue with family discussion.

## 2020-04-24 NOTE — Plan of Care (Signed)

## 2020-04-24 NOTE — Progress Notes (Signed)
RT NOTES: ABG obtained and sent to lab. Lab tech notified.  

## 2020-04-24 NOTE — Progress Notes (Signed)
Informed by RN-the patient remains tachypneic even after dialysis.  Went and reevaluated patient-he remains pretty tachypneic-suspect that he continues to aspirate intermittently.  Have changed fentanyl to Dilaudid-have added IV Ativan.  Again spoke with patient's sister-Deborah-explained that patient has had worsening in his situation since yesterday-and at that we were attempting symptom control with Dilaudid/Ativan-family aware that patient may not survive to tomorrow's family meeting.  DNR remains in place-Deborah was understanding.  She made me aware that the patient's daughter and mother are on the way and should be arriving shortly.

## 2020-04-24 NOTE — Progress Notes (Signed)
Spoke with [place].  We discussed that he is likely dying very soon and family should come see him.  I explained that we will change his status to comfort measures - focusing on his comfort and allowing family to come visit.    [name] stated that she was unable to tell her mother exactly what was happening (could not bear to tell her).  I asked the RN to call me if family has any questions when they arrive.  I explained to [name] that Mr. Manzo body is simply giving out.  Both she and the doctors have done absolutely everything that could be done - but he is dying.  [name] stated that she understood.  She noticed a big change in him last Sunday when she visited.  [name] stated 5 family members are on their way in to visit.   I called the [place] at the front desk to let them know 2W26's family will arrive soon and to please let them up to the floor.  Discussed change to comfort measures with bedside RN, Raliegh Scarlet, PA-C Palliative Medicine Office:  [phone]

## 2020-04-24 NOTE — Progress Notes (Addendum)
[facility] PROGRESS NOTE  Shane Banks EHO:[id number] DOB: January 21, 1946 DOA: 02/14/2020 PCP: Shane Border, MD  Status: Inpatient--Remains inpatient appropriate because :Unsafe d/c plan, IV treatments appropriate due to intensity of illness or inability to take PO and Inpatient level of care appropriate due to severity of illness-patient continues to have tenuous respiratory status   Dispo:  Patient From:  Home  Planned Disposition:  Home w/ hospice vs IP comfort care  Expected discharge date:  TBD  Medically stable for discharge: No-needs to transition to cuffless trach (respiratory status too tenuous to accomplish), needs to be able to sit upright in recliner to pursue dialysis in the outpatient setting (profoundly physically deconditioned and unable to even lift arms at this juncture),   7/20; spoke with Shane Banks.  Agreed to delay decision making regarding comfort measures until all family has had opportunity to come visit with patient.  Also briefly discussed death and dying in the dialysis patient and reassured her that we would continue dialysis right up until transport to ensure patient comfort and stability for transport.  Attending physician over the weekend stated he had also spoken with patient's sister who had questions regarding whether outpatient hemodialysis could be achieved by transporting the patient back and forth to the hospital.  She was also requesting to speak with nephrology on 7/26 nephrologist Shane Banks spoke with patient's sister and confirmed rationale for why he is not an appropriate candidate for outpatient hemodialysis.  Plans to recontact sister on 7/27 to see if there are additional questions from the rest of the family.  7/28: Patient with significant change in clinical status overnight.  He appears to be frequently aspirating oral secretions.  Required frequent suctioning.  Worsened tachycardia with respiratory rates staying in the 50s even after  suctioning.  O2 sats between 90 to 95%.  Patient subsequently sent to dialysis and has been evaluated by Shane Banks the nephrologist.  He agrees that respiratory symptoms are not related to volume overload.  He has previously discussed with my current attending physician Shane Banks that given patient's poor respiratory status and overall poor prognosis and current increased work of breathing that he may electively opt to discontinue dialysis. Conference telephone call held by my attending and myself with patient's Sister Shane Banks who is power of attorney as well as one of his daughters.  Please see physician documentation on separate note.  Change in status made clear to family and that physician decision has been made that patient is no longer a candidate for aggressive resuscitation such as CPR or mechanical ventilation therefore it was agreed upon that patient would be a DNR.  Also informed family that patient would be given medications to aid in comfort including Ativan and morphine for comfort both Shane Banks and patient's daughter agreed.  Patient is now DNR.  Hospice/palliative services have been consulted to begin working on transitioning to possible comfort measures as directed by family.  Family plans on coming to hospital today to visit patient and speak with staff as needed.  7/28: 1605  Palliative care has spoken again to family as follows: Talked to Shane Banks again. She understands his body is giving out. We changed him to comfort measures. Unrestricted visitation (family is coming up this evening). She's hoping he will last a little longer.   Code Status: Full Family Communication: Shane Banks sister and daughter via conference telephone call w/ Dr Sloan Banks leading the discussion DVT prophylaxis: Eliquis  HPI: 74 y.o. 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 Patient treated for Covid pneumonia January 2021 and had been on the vent because of progressive ARDS.  Because of lack of progression he underwent tracheostomy and was [facility].  Was sent back to this facility in May due to trach site bleeding suspected hemoptysis.  He was also noted with sacral decubitus at time of presentation in May.  Since admission he has been treated for recurrent multidrug-resistant Pseudomonas pneumonia and MRSA and enterococcal bacteremia.  On 03/15/2020 patient returned to the ICU for ventilatory and pressor support he developed septic shock and developed acute renal failure requiring dialysis.  He developed sepsis physiology likely secondary to sacral decubitus ulcer with underlying osteomyelitis.    He has been weaned from pressors. PCCM is following for trach weaning with the assistance of SLP.  Patient was liberated from the vent on 03/21/2020 and was transferred to the floor in 03/22/2020.  Due to the lack of insurance approval he cannot go back to [facility].  Social worker is working on Clinical research associate.  He will need to be transition to a cuffless trach which unfortunately has not been accomplished due to recurrent [name] with respiratory failure physiology.  He has been deemed to not be a candidate for outpatient hemodialysis by the nephrologist.  Patient has not made any progress in mobility OT and PT have signed off.  Several weeks ago he had issues regarding aspiration pneumonitis on top of multidrug-resistant Pseudomonas pneumonia that has been treated with [name]  and [name].  He continues to remain quite weak and is unable to lift arms off the bed and has bilateral upper extremity contractures limiting mobility as well.  SLP has been attempting to utilize PMV but  patient exquisitely weak and having difficulty tolerating as well.  Ethics physician and myself did have telephonic meeting with patient's sister.  Plans were to allow for children to visit on 7/21 with plans to make a decision about comfort measures after that.  On 7/21 sister asked for additional time to allow other family members to visit with the patient.  Although sister did initiate conversation with home health agency she still remains conflicted about definite discharge plan and transitioning to comfort measures.  Awaiting other family members to visit patient so they can see how poorly he is doing and hopefully with their encouragement she will be able to move forward with recommended plan to transition to comfort measures  As of 7/28 patient with worsened clinical status.  Concerns patient may need to be transition back to ventilator.  Attending physician had detailed conversation with family regarding necessity to transition to DNR and highly consider transitioning to comfort measures.  Please see above as well as Dr. Nena Alexander detailed note regarding the telephone conversation.  She is also started on low-dose IV Ativan and morphine for comfort.  Per attendings discussion with Shane Banks it is likely given patient's poor respiratory status that dialysis will be electively withdrawn by the nephrology team.  Subjective: Awake.  Nodding appropriately to questions but clearly in distress as evidenced by increased work of breathing and tachypnea.  Discussed patient's poor prognosis with him and that it may be necessary for him to communicate with his family regarding his wishes regarding possible withdrawal of care.  He  nodded yes.  Objective: Vitals:   04/24/20 1000 04/24/20 1030  BP: (!) 131/77 (!) 102/59  Pulse: 72 94  Resp: (!) 48 (!) 49  Temp:    SpO2:      Intake/Output Summary (Last 24 hours) at 04/24/2020 1046 Last data filed at 04/24/2020 0600 Gross per 24 hour  Intake 1950 ml   Output 450 ml  Net 1500 ml   Filed Weights   04/23/20 0328 04/24/20 0343 04/24/20 0940  Weight: 69 kg 66.9 kg 66.9 kg    Exam:  Constitutional: NAD, calm and alert today, appears to be mildly cunomfortable along dialysis Neck: midline cuffed trach in place  Respiratory: Bilateral lung sounds clear to auscultation anteriorly.  Worsened tachypnea with respiratory rates now in the 50s associated with increased work of breathing, trach collar oxygen/FiO2 28% O2 sats 92 to 95%-has required frequent suctioning overnight without any significant improvement in respiratory status. Cardiovascular: Irregular rate with underlying rhythm atrial flutter with controlled ventricular response, no rubs, murmurs thrills or gallops.  Postdialysis upper extremity edema has resolved.    2+ pedal pulses. Dialysis catheter right subclavian vein. Abdomen: no tenderness, no masses palpated. Bowel sounds positive.  Colostomy in place with light reddish brown unformed stool.  Gastrostomy tube with tube feeding GU: Condom catheter in place draining yellow-colored urine to bedside bag Musculoskeletal: no clubbing / cyanosis. No joint deformity upper and lower extremities.  Not spontaneously moving UEs but with PROM has adequate range of motion. Extension contracture involving the right wrist and right elbow.  Also flexion contracture of left hand. Diminished muscle tone throughout all extremities and too weak to follow commands with upper extremities/unable to lift extremities upon command  Skin: no rashes, lesions, sacral decubitus not examined Neurologic: CN 2-12 grossly intact. 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  PCCM following periodically to assess readiness for transition cuffless trach. 7/6: Significant respiratory decompensation with FiO2 increased up to 60%. Aspiration and recurrent Pseudomonas pneumonia as etiology. Of note, family refusing further conversations with palliative medicine team.  Excellent telephonic meeting with ethics physician Dr. Andria Frames on 7/14.  Please see his note for details.  Sister made aware of no meaningful overall recovery including from a pulmonary standpoint. Has completed 8 doses of IV meropenem as of 7/9.   Repeat sputum culture with few MDR Pseudomonas (resistant to imipenem/Carbapenem but sensitive to gentamicin).  Due to mild fever he was started on antibiotics and has completed a course of Fortaz with [name] as of 7/19  Unfortunately given recurrent pneumonia and increased secretions with associated generalized weakness/weak cough it is highly doubtful he can transition to cuffless trach at this point.  Overall prognosis is poor.  Continue chest physiotherapy with vibra vest administered per RT As of 7/28 respiratory status has worsened and patient continues to aspirate oral secretions and is quite symptomatic.  Discussion held with family as above and patient is now a DNR and will not be mechanically ventilated and CPR will not be initiated.  Family has been wrongly encouraged to transition to comfort measures given patient status is futile and has no hope for any meaningful recovery.  They are also aware that given his respiratory  status this also makes him ineligible for ongoing hemodialysis according to recent discussion with Shane Banks  Goals of care: Ethics team meeting on 7/14 as described above. I will be recontacting patient's Sister Shane Banks today prior to 4 PM to determine if family has reached a decision regarding transitioning patient  to comfort care. Please see detailed notes from Dr. Andria Frames dated 7/14 **Patient sister contacted  [facility]- goal for contacting home health to determine if EOL care would be possible in the home setting.  Sister still remains conflicted but she is aware that we are contacting hospice in the [place] area in the event and EOL home care discharge plan needs to be in place.  Residential hospice also on the table.  TOC documented family would need to pay for ambulance transport to [facility] 7/25: Sister now has questions regarding whether outpatient hemodialysis can be achieved by transporting patient back and forth to hospital to receive hemodialysis 3 days/week she was also requesting to speak with the nephrologist.  On 7/26 Shane Banks also spoke with patient's sister regarding and appropriateness for outpatient hemodialysis and plans are to recontact her on 7/27 to determine if other family members have additional questions. 7/28: See above.  Patient now DNR and medications have not been instituted to ensure patient comfort.  Dialysis likely to be withdrawn by nephrology team due to poor respiratory status.  Family to arrive to bedside today.  Palliative team has been reconsulted and family in agreement.  It is likely that patient will not survive more than 1 week after dialysis withdrawn and family is aware.  Hypertrophic cardiomyopathy Patient apparently has had this diagnosis for some time. Echocardiogram completed on 7/20 with hyperdynamic EF of 70% with severe asymmetric LV hypertrophy-interpreting cardiologist confirm diagnosis after chart review of outside records  Diarrhea Noninfectious based on testing Continue cholestyramine twice daily   Mild funguria/oral yeast Urine culture with 30,000 colonies of yeast Completed Diflucan as of 7/19  Sacral osteomyelitis and HD catheter sepsis/  Enterococcal bacteremia: Tunneled catheter removal on 02/21/2020. 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  Chronic kidney disease stage V now requiring hemodialysis (mwf) 2/2 ATN and AKI from IV gentamicin: On HD since March 2021 status post tunneled catheter placement 02/28/2020 and as of 7/1 site unremarkable. Unfortunately, this patient continues to decline, he has significant weakness and is unable to tolerate sitting up in a recliner.  Attending nephrologist has documented that patient is not a candidate for outpatient hemodialysis Given current worsening of respiratory status with no hope for recovery dialysis team is considering elective withdrawal of hemodialysis  Chronic normocytic anemia: Due to anemia of chronic renal disease Status post 1 unit of PRBCs Hgb on 7/26 was 9.3 Continue erythropoietin and IV iron per renal.  History of nonhemorrhagic CVA: CT of the head showed atrophy and chronic microvascular disease. 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 Documented as full-thickness tissue loss  Severe protein caloric malnutrition: Tube feedings on hold due to concerns of a possible aspiration as above.  Patient is dialysis patient but will allow D5 normal saline at 30 cc/h to replace tube feeding volume loss Will allow medications per tube with small amounts of  flush Nutrition Status: Nutrition Problem: Severe Malnutrition Etiology: chronic illness (chronic respiratory failure related to COVID ARDS) Signs/Symptoms: severe muscle depletion, severe fat depletion Interventions: Prostat, Tube feeding, Juven-continue tube feeding and check residuals every shift    Attending:  Patient was seen and examined earlier this morning-he appears to be very close to end-of-life-very tachypneic-requiring frequent suctioning overnight.  He has had a prolonged hospital stay-currently with tracheostomy and PEG tube placement.  Appears to be actively aspirating.  I called the patient's sister-she was able to get the patient's daughter on the line when I was talking to her-I explained to the family that the patient was actively dying-and that further aggressive care would really not change outcome but only prolong his pain and suffering.  Explained that aggressive care would not be beneficial-family agreed for DNR order.  Palliative care will follow-they have scheduled a family meeting.  Family aware that we will be using narcotic/benzodiazepine for comfort.  Please see my note from earlier today.   Data Reviewed: Basic Metabolic Panel: Recent Labs  Lab 04/18/20 0245 04/19/20 0822 04/21/20 0329 04/22/20 1030 04/24/20 0850  NA 129* 126* 131* 128* 133*  K 3.4* 3.6 3.6 3.6 4.2  CL 97* 93* 99 95* 98  CO2 24 26 23 24 26   GLUCOSE 138* 142* 132* 152* 153*  BUN 55* 95* 81* 123* 91*  CREATININE 1.61* 2.22* 1.99* 2.45* 2.08*  CALCIUM 8.7* 9.0 9.3 9.6 9.6  PHOS 2.3* 3.1 2.3* 3.6 3.1   Liver Function Tests: Recent Labs  Lab 04/18/20 0245 04/19/20 0822 04/21/20 0329 04/22/20 1030 04/24/20 0850  ALBUMIN 1.9* 1.7* 1.7* 1.8* 1.9*   No results for input(s): LIPASE, AMYLASE in the last 168 hours. No results for input(s): AMMONIA in the last 168 hours. CBC: Recent Labs  Lab 04/19/20 0825 04/22/20 1021 04/24/20 0850  WBC 16.9* 15.8* 13.9*  HGB 8.3* 9.3* 9.8*  HCT 27.8* 30.2* 32.7*  MCV 78.3* 75.5* 76.8*  PLT 290 280 310   Cardiac Enzymes: No results for input(s): CKTOTAL, CKMB, CKMBINDEX, TROPONINI in the last 168 hours. BNP (last 3 results) No results for input(s): BNP in the last 8760 hours.  ProBNP (last 3 results) No results for input(s): PROBNP in the last 8760  hours.  CBG: Recent Labs  Lab 04/23/20 1911 04/23/20 2346 04/24/20 0259 04/24/20 0401 04/24/20 0812  GLUCAP 150* 140* 123* 124* 133*    No results found for this or any previous visit (from the past 240 hour(s)).   Studies: No results found.  Scheduled Meds: . apixaban  2.5 mg Per Tube BID  . chlorhexidine  15 mL Mouth Rinse BID  . Chlorhexidine Gluconate Cloth  6 each Topical Q0600  . cholestyramine  4 g Per Tube BID  . darbepoetin (ARANESP) injection - DIALYSIS  150 mcg Intravenous Q Mon-HD  . feeding supplement (PROSource TF)  45 mL Per Tube TID  . insulin aspart  0-6 Units Subcutaneous Q4H  . insulin aspart  2 Units Subcutaneous Q4H  . levothyroxine  25 mcg Per Tube Q0600  . mouth rinse  15 mL Mouth Rinse q12n4p  . metoprolol tartrate  25 mg Per Tube BID  . nutrition supplement (JUVEN)  1 packet Per Tube BID BM  . pantoprazole sodium  40 mg Per Tube Daily   Continuous Infusions: . dextrose Stopped (04/21/20 2052)  . feeding supplement (NEPRO CARB STEADY) 1,000 mL (04/24/20 0020)    Principal Problem:   Enterococcal bacteremia Active Problems:   Acute respiratory  failure (HCC)   Hemoptysis   Pressure injury of skin   Protein-calorie malnutrition, severe   Decubitus ulcer of sacral region, stage 4 (HCC)   Fever   Palliative care by specialist   Goals of care, counseling/discussion   DNR (do not resuscitate)   Tracheostomy status [facility])     Consultants: PCCM Ethics committee/Dr. Andria Frames Nephrology  Procedures: Echocardiogram 1. Left ventricular ejection fraction, by estimation, is 60 to 65%. The  left ventricle has normal function. The left ventricle has no regional  wall motion abnormalities. There is severe left ventricular hypertrophy.  Left ventricular diastolic parameters  are consistent with Grade I diastolic dysfunction (impaired relaxation).  2. Right ventricular systolic function is normal. The right ventricular  size is normal. There is  mildly elevated pulmonary artery systolic  pressure.  3. Left atrial size was mildly dilated.  4. Right atrial size was mildly dilated.  5. The mitral valve is grossly normal. Trivial mitral valve  regurgitation.  6. The aortic valve is tricuspid. Aortic valve regurgitation is trivial.   Antibiotics: Anti-infectives (From admission, onward)   Start     Dose/Rate Route Frequency Ordered Stop   04/12/20 0900  ceftazidime-avibactam (AVYCAZ) 0.94 g in dextrose 5 % 50 mL IVPB  Status:  Discontinued        0.94 g 25 mL/hr over 2 Hours Intravenous Every 24 hours 04/11/20 1225 04/17/20 1034   04/10/20 2100  ceftazidime-avibactam (AVYCAZ) 0.94 g in dextrose 5 % 50 mL IVPB  Status:  Discontinued        0.94 g 25 mL/hr over 2 Hours Intravenous Every 12 hours 04/10/20 2005 04/11/20 1225   04/08/20 1420  ceftazidime-avibactam (AVYCAZ) 0.94 g in dextrose 5 % 50 mL IVPB  Status:  Discontinued        0.94 g 25 mL/hr over 2 Hours Intravenous Daily at bedtime 04/08/20 1420 04/10/20 2005   04/07/20 1800  ceftazidime-avibactam (AVYCAZ) 0.94 g in dextrose 5 % 50 mL IVPB  Status:  Discontinued        0.94 g 25 mL/hr over 2 Hours Intravenous Every 12 hours 04/07/20 1322 04/08/20 1420   04/05/20 1000  fluconazole (DIFLUCAN) 40 MG/ML suspension 100 mg  Status:  Discontinued       "Followed by" Linked Group Details   100 mg Per Tube Daily 04/04/20 2203 04/15/20 1248   04/04/20 2300  fluconazole (DIFLUCAN) 40 MG/ML suspension 200 mg       "Followed by" Linked Group Details   200 mg Per Tube  Once 04/04/20 2203 04/04/20 2325   04/01/20 1013  vancomycin (VANCOCIN) 500-5 MG/100ML-% IVPB       Note to Pharmacy: Cherylann Banas   : cabinet override      04/01/20 1013 04/01/20 1253   03/29/20 1552  vancomycin (VANCOCIN) 500-5 MG/100ML-% IVPB       Note to Pharmacy: Anthonette Legato, Melisa   : cabinet override      03/29/20 1552 03/29/20 1556   03/29/20 1415  fluconazole (DIFLUCAN) 40 MG/ML suspension 100 mg        100  mg Oral Daily 03/29/20 1408 03/31/20 1015   03/28/20 2000  meropenem (MERREM) 500 mg in sodium chloride 0.9 % 100 mL IVPB        500 mg 200 mL/hr over 30 Minutes Intravenous Every 24 hours 03/28/20 1623 04/04/20 2201   03/25/20 1800  cefTAZidime (FORTAZ) 1 g in sodium chloride 0.9 % 100 mL IVPB  Status:  Discontinued       "  Followed by" Linked Group Details   1 g 200 mL/hr over 30 Minutes Intravenous Every 24 hours 03/18/20 1015 03/28/20 1613   03/22/20 0830  vancomycin (VANCOREADY) IVPB 500 mg/100 mL        500 mg 100 mL/hr over 60 Minutes Intravenous  Once 03/22/20 0827 03/22/20 1014   03/20/20 1200  vancomycin (VANCOCIN) IVPB 500 mg/100 ml premix        500 mg 100 mL/hr over 60 Minutes Intravenous Every M-W-F (Hemodialysis) 03/19/20 0941 04/03/20 1500   03/18/20 1800  meropenem (MERREM) 500 mg in sodium chloride 0.9 % 100 mL IVPB       "Followed by" Linked Group Details   500 mg 200 mL/hr over 30 Minutes Intravenous Every 24 hours 03/18/20 1015 03/25/20 0026   03/18/20 1200  vancomycin (VANCOCIN) IVPB 500 mg/100 ml premix        500 mg 100 mL/hr over 60 Minutes Intravenous Every M-W-F (Hemodialysis) 03/18/20 0938 03/18/20 1700   03/18/20 1200  cefTAZidime (FORTAZ) 2 g in sodium chloride 0.9 % 100 mL IVPB  Status:  Discontinued        2 g 200 mL/hr over 30 Minutes Intravenous Every Mon (Hemodialysis) 03/18/20 0939 03/18/20 1015   03/07/20 1800  cefTAZidime (FORTAZ) 2 g in sodium chloride 0.9 % 100 mL IVPB  Status:  Discontinued        2 g 200 mL/hr over 30 Minutes Intravenous Every T-Th-Sa (1800) 03/06/20 1255 03/18/20 1015   03/07/20 1200  vancomycin (VANCOCIN) IVPB 500 mg/100 ml premix  Status:  Discontinued        500 mg 100 mL/hr over 60 Minutes Intravenous Every T-Th-Sa (Hemodialysis) 03/06/20 1255 03/19/20 0941   03/04/20 2000  cefTAZidime (FORTAZ) 2 g in sodium chloride 0.9 % 100 mL IVPB  Status:  Discontinued        2 g 200 mL/hr over 30 Minutes Intravenous Every M-W-F  (2000) 03/01/20 1136 03/06/20 1255   03/02/20 1200  vancomycin (VANCOCIN) IVPB 500 mg/100 ml premix        500 mg 100 mL/hr over 60 Minutes Intravenous Every T-Th-Sa (Hemodialysis) 03/01/20 1131 03/02/20 1710   03/01/20 1230  vancomycin (VANCOCIN) IVPB 500 mg/100 ml premix  Status:  Discontinued        500 mg 100 mL/hr over 60 Minutes Intravenous Every M-W-F (Hemodialysis) 03/01/20 1131 03/06/20 1255   02/28/20 1110  vancomycin (VANCOREADY) IVPB 500 mg/100 mL        over 60 Minutes  Continuous PRN 02/28/20 1112 02/28/20 1110   02/28/20 1101  vancomycin (VANCOCIN) 1-5 GM/200ML-% IVPB  Status:  Discontinued       Note to Pharmacy: Lytle Butte   : cabinet override      02/28/20 1101 02/28/20 1327   02/27/20 1800  cefTAZidime (FORTAZ) 1 g in sodium chloride 0.9 % 100 mL IVPB        1 g 200 mL/hr over 30 Minutes Intravenous Every 24 hours 02/27/20 0947 03/03/20 1738   02/27/20 1200  vancomycin (VANCOCIN) IVPB 500 mg/100 ml premix        500 mg 100 mL/hr over 60 Minutes Intravenous Once 02/27/20 0829 02/27/20 1343   02/24/20 1800  vancomycin (VANCOREADY) IVPB 500 mg/100 mL        500 mg 100 mL/hr over 60 Minutes Intravenous  Once 02/24/20 1308 02/24/20 1905   02/22/20 2200  metroNIDAZOLE (FLAGYL) tablet 500 mg  Status:  Discontinued        500 mg  Per Tube Every 8 hours 02/22/20 0957 03/30/20 1057   02/22/20 1700  cefTRIAXone (ROCEPHIN) 2 g in sodium chloride 0.9 % 100 mL IVPB  Status:  Discontinued        2 g 200 mL/hr over 30 Minutes Intravenous Every 24 hours 02/22/20 0957 02/27/20 0947   02/22/20 1200  vancomycin (VANCOCIN) IVPB 500 mg/100 ml premix  Status:  Discontinued        500 mg 100 mL/hr over 60 Minutes Intravenous Every T-Th-Sa (Hemodialysis) 02/20/20 1253 02/21/20 1040   02/21/20 1431  vancomycin variable dose per unstable renal function (pharmacist dosing)  Status:  Discontinued         Does not apply See admin instructions 02/21/20 1431 03/01/20 1131   02/21/20 1200   vancomycin (VANCOREADY) IVPB 500 mg/100 mL        500 mg 100 mL/hr over 60 Minutes Intravenous  Once 02/21/20 0956 02/21/20 1253   02/21/20 0714  vancomycin (VANCOCIN) 500-5 MG/100ML-% IVPB  Status:  Discontinued       Note to Pharmacy: Ashley Akin   : cabinet override      02/21/20 0714 02/21/20 1346   02/20/20 1400  vancomycin (VANCOREADY) IVPB 1250 mg/250 mL        1,250 mg 166.7 mL/hr over 90 Minutes Intravenous  Once 02/20/20 1253 02/20/20 1538   02/20/20 1200  levofloxacin (LEVAQUIN) IVPB 500 mg  Status:  Discontinued        500 mg 100 mL/hr over 60 Minutes Intravenous Every 48 hours 02/20/20 1020 02/21/20 0943   02/15/20 1545  vancomycin (VANCOCIN) IVPB 1000 mg/200 mL premix  Status:  Discontinued       "Followed by" Linked Group Details   1,000 mg 200 mL/hr over 60 Minutes Intravenous Every 24 hours 02/14/20 1530 02/14/20 1745   02/15/20 0330  ceFEPIme (MAXIPIME) 2 g in sodium chloride 0.9 % 100 mL IVPB  Status:  Discontinued        2 g 200 mL/hr over 30 Minutes Intravenous Every 12 hours 02/14/20 1531 02/14/20 1531   02/14/20 1531  ceFEPIme (MAXIPIME) 2 g in sodium chloride 0.9 % 100 mL IVPB  Status:  Discontinued        2 g 200 mL/hr over 30 Minutes Intravenous Every 12 hours 02/14/20 1531 02/14/20 1745   02/14/20 1530  ceFEPIme (MAXIPIME) 2 g in sodium chloride 0.9 % 100 mL IVPB  Status:  Discontinued        2 g 200 mL/hr over 30 Minutes Intravenous  Once 02/14/20 1520 02/14/20 1531   02/14/20 1530  vancomycin (VANCOREADY) IVPB 1500 mg/300 mL  Status:  Discontinued       "Followed by" Linked Group Details   1,500 mg 150 mL/hr over 120 Minutes Intravenous  Once 02/14/20 1530 02/14/20 1745        Time spent: 35 minutes    Erin Hearing ANP [facility] Pager [phone]. If 7PM-7AM, please contact night-coverage at [email or web address] 04/24/2020, 10:46 AM  LOS: 70 days

## 2020-04-24 NOTE — Progress Notes (Signed)
Patient ID: Shane Banks, male   DOB: Aug 13, 1946, 74 y.o.   MRN: [id number]  [facility] Progress Note   Assessment/ Plan:   1.  Acute on chronic hypoxic respiratory failure: Status post tracheostomy with transfer to the acute hospital after developing some bleeding around tracheostomy site.  Status post Avycaz for recurrent HCAP. 2. 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  Given his current deconditioning and nutritional needs as well as ongoing tracheostomy care requirements; he is physically suitable for outpatient hemodialysis placement.  I called his sister Shane Banks on 7/26 and 7/27 and answered their questions about his inability to get hemodialysis as an outpatient-specifically that hospitals would not provide outpatient hemodialysis and dialysis companies would not be able to send a nurse/technician out to the house to perform dialysis.  I have encouraged them to transition to conservative management/hospice.  His mother/brother are anticipated to visit with him today. 3. Anemia: Likely secondary to underlying end-stage renal disease and compounded by acute illness/inflammatory component.  Continue ESA. 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 5. Nutrition: Remains n.p.o. and getting tube feeds due to concerns of recurrent aspiration.  Remains cachectic and with moderate to severe protein calorie malnutrition. 6. Hypertension: Blood pressures within acceptable range, continue ultrafiltration with hemodialysis based on I/O balance.  Subjective:   Had oxygen desaturation and increased work of breath overnight, respiratory therapy suctioned tenacious secretions from tracheostomy and oxygenation improved.   Objective:   BP 122/74   Pulse 92   Temp 97.9 F (36.6 C) (Axillary)   Resp (!)  24   Ht 5\' 9"  (1.753 m)   Wt 66.9 kg   SpO2 90%   BMI 21.78 kg/m   Physical Exam: Gen: Chronically ill-appearing and cachectic man with tracheostomy.  Somnolent and attempts to open his eyes when I call out his name CVS: Pulse regular rhythm, normal rate, S1 and S2 normal Resp: Coarse/transmitted breath sounds bilaterally, no distinct rales or rhonchi.  Right IJ TDC Abd: Soft, colostomy bag in situ.  Feeding tube in situ Ext: Trace lower extremity edema.  Some dependent edema noted over lower back.  Labs: BMET Recent Labs  Lab 04/18/20 0245 04/19/20 0822 04/21/20 0329 04/22/20 1030  NA 129* 126* 131* 128*  K 3.4* 3.6 3.6 3.6  CL 97* 93* 99 95*  CO2 24 26 23 24   GLUCOSE 138* 142* 132* 152*  BUN 55* 95* 81* 123*  CREATININE 1.61* 2.22* 1.99* 2.45*  CALCIUM 8.7* 9.0 9.3 9.6  PHOS 2.3* 3.1 2.3* 3.6   CBC Recent Labs  Lab 04/19/20 0825 04/22/20 1021  WBC 16.9* 15.8*  HGB 8.3* 9.3*  HCT 27.8* 30.2*  MCV 78.3* 75.5*  PLT 290 280      Medications:    . apixaban  2.5 mg Per Tube BID  . chlorhexidine  15 mL Mouth Rinse BID  . Chlorhexidine Gluconate Cloth  6 each Topical Q0600  . cholestyramine  4 g Per Tube BID  . darbepoetin (ARANESP) injection - DIALYSIS  150 mcg Intravenous Q Mon-HD  . feeding supplement (PROSource TF)  45 mL Per Tube TID  . insulin aspart  0-6 Units Subcutaneous Q4H  . insulin aspart  2 Units Subcutaneous Q4H  . levothyroxine  25 mcg Per Tube Q0600  . mouth rinse  15 mL Mouth Rinse q12n4p  .  metoprolol tartrate  25 mg Per Tube BID  . nutrition supplement (JUVEN)  1 packet Per Tube BID BM  . pantoprazole sodium  40 mg Per Tube Daily   Elmarie Shiley, MD 04/24/2020, 7:57 AM

## 2020-04-25 DIAGNOSIS — B952 Enterococcus as the cause of diseases classified elsewhere: Secondary | ICD-10-CM | POA: Diagnosis not present

## 2020-04-25 DIAGNOSIS — Z992 Dependence on renal dialysis: Secondary | ICD-10-CM

## 2020-04-25 DIAGNOSIS — J151 Pneumonia due to Pseudomonas: Secondary | ICD-10-CM | POA: Diagnosis not present

## 2020-04-25 DIAGNOSIS — M4628 Osteomyelitis of vertebra, sacral and sacrococcygeal region: Secondary | ICD-10-CM | POA: Diagnosis present

## 2020-04-25 DIAGNOSIS — R7881 Bacteremia: Secondary | ICD-10-CM | POA: Diagnosis not present

## 2020-04-25 DIAGNOSIS — I421 Obstructive hypertrophic cardiomyopathy: Secondary | ICD-10-CM | POA: Diagnosis present

## 2020-04-25 DIAGNOSIS — N186 End stage renal disease: Secondary | ICD-10-CM

## 2020-04-25 DIAGNOSIS — J9600 Acute respiratory failure, unspecified whether with hypoxia or hypercapnia: Secondary | ICD-10-CM | POA: Diagnosis not present

## 2020-04-25 DIAGNOSIS — Z515 Encounter for palliative care: Secondary | ICD-10-CM | POA: Diagnosis not present

## 2020-04-25 MED ORDER — GLYCOPYRROLATE 0.2 MG/ML IJ SOLN: 0.3000 mg | Freq: Two times a day (BID) | INTRAMUSCULAR | Status: AC

## 2020-04-25 MED ORDER — HYDROMORPHONE HCL 1 MG/ML IJ SOLN
0.2500 mg | Freq: Four times a day (QID) | INTRAMUSCULAR | Status: DC
  Administered 2020-04-25 – 2020-04-26 (×5): 0.25 mg via INTRAVENOUS
  Filled 2020-04-25 (×5): qty 1

## 2020-04-25 MED ORDER — ONDANSETRON HCL 4 MG/2ML IJ SOLN: 4.0000 mg | Freq: Four times a day (QID) | INTRAMUSCULAR | 0 refills | Status: AC | PRN

## 2020-04-25 MED ORDER — GLYCOPYRROLATE 0.2 MG/ML IJ SOLN: 0.2000 mg | INTRAMUSCULAR | Status: AC | PRN

## 2020-04-25 MED ORDER — HALOPERIDOL LACTATE 5 MG/ML IJ SOLN: 0.5000 mg | INTRAMUSCULAR | Status: AC | PRN

## 2020-04-25 MED ORDER — HALOPERIDOL LACTATE 2 MG/ML PO CONC: 0.5000 mg | ORAL | 0 refills | Status: AC | PRN

## 2020-04-25 MED ORDER — ACETAMINOPHEN 160 MG/5ML PO SOLN: 650.0000 mg | ORAL | 0 refills | Status: AC | PRN

## 2020-04-25 MED ORDER — GLYCOPYRROLATE 1 MG PO TABS: 1.0000 mg | ORAL_TABLET | ORAL | Status: AC | PRN

## 2020-04-25 MED ORDER — LORAZEPAM 2 MG/ML IJ SOLN: 0.5000 mg | Freq: Four times a day (QID) | INTRAMUSCULAR | 0 refills | Status: AC | PRN

## 2020-04-25 MED ORDER — POLYVINYL ALCOHOL 1.4 % OP SOLN: 1.0000 [drp] | Freq: Four times a day (QID) | OPHTHALMIC | 0 refills | Status: AC | PRN

## 2020-04-25 MED ORDER — ONDANSETRON 4 MG PO TBDP: 4.0000 mg | ORAL_TABLET | Freq: Four times a day (QID) | ORAL | 0 refills | Status: AC | PRN

## 2020-04-25 MED ORDER — HYDROMORPHONE HCL 1 MG/ML IJ SOLN: 0.5000 mg | Freq: Two times a day (BID) | INTRAMUSCULAR | 0 refills | Status: AC | PRN

## 2020-04-25 MED ORDER — LORAZEPAM 2 MG/ML IJ SOLN: 0.5000 mg | Freq: Two times a day (BID) | INTRAMUSCULAR | 0 refills | Status: AC

## 2020-04-25 MED ORDER — IPRATROPIUM-ALBUTEROL 0.5-2.5 (3) MG/3ML IN SOLN: 3.0000 mL | Freq: Four times a day (QID) | RESPIRATORY_TRACT | Status: AC | PRN

## 2020-04-25 MED ORDER — HALOPERIDOL 0.5 MG PO TABS: 0.5000 mg | ORAL_TABLET | ORAL | Status: AC | PRN

## 2020-04-25 NOTE — TOC Progression Note (Signed)
Transition of Care [facility]) - Progression Note    Patient Details  Name: Shane Banks MRN: [id number] Date of Birth: 10-17-1945  Transition of Care [facility]) CM/SW [place], RN Phone Number: 04/25/2020, 10:57 AM  Clinical Narrative:     Family wants to transport to [place] to hospice residential. [facility] will not transport over 75 miles.unless paid up front. Spoke to [employer]. She wants to speak with Pal[facility] with children so they have a chance to answer questions Mentioned about the  Up fornt payment. Will get transport fees and call her back. [facility] And spoke with CSW there. He may qualify for inpatient there will fax information to her and she will have MD there look it over and call me back with a decision.  Expected Discharge Plan: Long Term Acute Care (LTAC) Barriers to Discharge: Continued Medical Work up, SNF Pending bed offer  Expected Discharge Plan and Services Expected Discharge Plan: [place] (LTAC)       Living arrangements for the past 2 months: Post-Acute Facility                                       Social Determinants of Health (SDOH) Interventions    Readmission Risk Interventions No flowsheet data found.

## 2020-04-25 NOTE — Progress Notes (Signed)
Patient ID: Shane Banks, male   DOB: 05-07-1946, 74 y.o.   MRN: [id number]  [facility] Progress Note   Assessment/ Plan:   1.  Acute on chronic hypoxic respiratory failure: Status post tracheostomy with transfer to the acute hospital after developing some bleeding around tracheostomy site.  Status post Avycaz for recurrent HCAP. -comfort care 2. 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  Given his current deconditioning and nutritional needs as well as ongoing tracheostomy care requirements; he is not physically suitable for outpatient hemodialysis placement. His sister [name] had been called on 7/26 and 7/27 and answered their questions about his inability to get hemodialysis as an outpatient-specifically that hospitals would not provide outpatient hemodialysis and dialysis companies would not be able to send a nurse/technician out to the house to perform dialysis.  Overall, I am in agreement with transition to comfort care at this point given that his not a candidate for outpatient hemodialysis 3. Anemia: Likely secondary to underlying end-stage renal disease and compounded by acute illness/inflammatory component.  Continue ESA. 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 5. Nutrition: Remains n.p.o. and getting tube feeds due to concerns of recurrent aspiration.  Remains cachectic and with moderate to severe protein calorie malnutrition. 6. Hypertension: Blood pressures within acceptable range, continue ultrafiltration with hemodialysis based on I/O balance.  Subjective:   Worsening respiratory status yesterday, tachypneic.  Meeting with primary and palliative care yesterday, transitioned to comfort care   Objective:   BP (!) 95/60 (BP Location: Left Arm)   Pulse 98   Temp 97.8 F (36.6 C)  (Axillary)   Resp (!) 40   Ht 5\' 9"  (1.753 m)   Wt 65.4 kg   SpO2 99%   BMI 21.29 kg/m   Physical Exam: Gen: Chronically ill-appearing and cachectic man with tracheostomy.  CVS: Pulse regular rhythm, normal rate, S1 and S2 normal Resp: Coarse/transmitted breath sounds bilaterally, no distinct rales or rhonchi.  Right IJ TDC Abd: Soft, colostomy bag in situ.  Feeding tube in situ Ext: no edema bl le's.  Some dependent edema noted over lower back. Neuro: Somnolent, opens eyes intermittently to vocal stimuli  Labs: BMET Recent Labs  Lab 04/19/20 0822 04/21/20 0329 04/22/20 1030 04/24/20 0850  NA 126* 131* 128* 133*  K 3.6 3.6 3.6 4.2  CL 93* 99 95* 98  CO2 26 23 24 26   GLUCOSE 142* 132* 152* 153*  BUN 95* 81* 123* 91*  CREATININE 2.22* 1.99* 2.45* 2.08*  CALCIUM 9.0 9.3 9.6 9.6  PHOS 3.1 2.3* 3.6 3.1   CBC Recent Labs  Lab 04/19/20 0825 04/22/20 1021 04/24/20 0850  WBC 16.9* 15.8* 13.9*  HGB 8.3* 9.3* 9.8*  HCT 27.8* 30.2* 32.7*  MCV 78.3* 75.5* 76.8*  PLT 290 280 310      Medications:    . chlorhexidine  15 mL Mouth Rinse BID  . Chlorhexidine Gluconate Cloth  6 each Topical Q0600  . cholestyramine  4 g Per Tube BID  . glycopyrrolate  0.3 mg Intravenous BID  .  HYDROmorphone (DILAUDID) injection  0.25 mg Intravenous Q6H  . LORazepam  0.5 mg Intravenous BID  . mouth rinse  15 mL Mouth Rinse q12n4p  . metoprolol tartrate  25 mg Per Tube BID  . pantoprazole sodium  40 mg Per Tube Daily  Gean Quint, MD  [facility]  04/25/2020, 8:57 AM

## 2020-04-25 NOTE — TOC Progression Note (Addendum)
Transition of Care [facility]) - Progression Note    Patient Details  Name: Shane Banks MRN: [id number] Date of Birth: 1946/02/27  Transition of Care [facility]) CM/SW [place], RN Phone Number: 04/25/2020, 12:49 PM  Clinical Narrative:    Faxed information over to [facility], [place] in Social Work for inpatient hospice per family request. Verbal. Will await reply for admittance. Transportation by [employer] would go through insurance and they would be able to transport patient tomorrow at 1400. Will make MD aware.    Expected Discharge Plan: Long Term Acute Care (LTAC) Barriers to Discharge: Continued Medical Work up, SNF Pending bed offer  Expected Discharge Plan and Services Expected Discharge Plan: [place] (LTAC)       Living arrangements for the past 2 months: Post-Acute Facility                                       Social Determinants of Health (SDOH) Interventions    Readmission Risk Interventions No flowsheet data found.

## 2020-04-25 NOTE — Care Management (Signed)
Current medications ordered for comfort routine Rubinol 0.3 mg twice daily given today Dilaudid 0.25mg   every six hours on 6, 12,6,12 schedule Ativan 0.5mg  IV twice daily given at 1000 Dilaudid PRN 1 mg as needed has not used.   Alma Friendly DNP

## 2020-04-25 NOTE — Plan of Care (Signed)

## 2020-04-25 NOTE — Progress Notes (Signed)
[facility] PROGRESS NOTE  Lelan Cush DEY:[id number] DOB: May 31, 1946 DOA: 02/14/2020 PCP: [name], MD  Status: Inpatient--Remains inpatient appropriate because :Unsafe d/c plan, IV treatments appropriate due to intensity of illness or inability to take PO and Inpatient level of care appropriate due to severity of illness-patient continues to have tenuous respiratory status   Dispo:  Patient From:  Home  Planned Disposition:  Residential hospice  Expected discharge date:  TBD  Medically stable for discharge: Not applicable-patient has now transition to comfort care including no dialysis and discharge plan is to hopefully obtain a bed closer to the family in [place] at a residential hospice facility.  Patient continues to require IV medications to promote optimal comfort.  7/20; spoke with [name].  Agreed to delay decision making regarding comfort measures until all family has had opportunity to come visit with patient.  Also briefly discussed death and dying in the dialysis patient and reassured her that we would continue dialysis right up until transport to ensure patient comfort and stability for transport.  Attending physician over the weekend stated he had also spoken with patient's sister who had questions regarding whether outpatient hemodialysis could be achieved by transporting the patient back and forth to the hospital.  She was also requesting to speak with nephrology on 7/26 nephrologist [name] spoke with patient's sister and confirmed rationale for why he is not an appropriate candidate for outpatient hemodialysis.  Plans to recontact sister on 7/27 to see if there are additional questions from the rest of the family.  7/28: Patient with significant change in clinical status overnight.  He appears to be frequently aspirating oral secretions.  Required frequent suctioning.  Worsened tachycardia with respiratory rates staying in the 50s even after suctioning.  O2  sats between 90 to 95%.  Patient subsequently sent to dialysis and has been evaluated by [name] the nephrologist.  He agrees that respiratory symptoms are not related to volume overload.  He has previously discussed with my current attending physician Dr. Sloan Leiter that given patient's poor respiratory status and overall poor prognosis and current increased work of breathing that he may electively opt to discontinue dialysis. Conference telephone call held by my attending and myself with patient's Sister [name] who is power of attorney as well as one of his daughters.  Please see physician documentation on separate note.  Change in status made clear to family and that physician decision has been made that patient is no longer a candidate for aggressive resuscitation such as CPR or mechanical ventilation therefore it was agreed upon that patient would be a DNR.  Also informed family that patient would be given medications to aid in comfort including Ativan and morphine for comfort both [name] and patient's daughter agreed.  Patient is now DNR.  Hospice/palliative services have been consulted to begin working on transitioning to possible comfort measures as directed by family.  Family plans on coming to hospital today to visit patient and speak with staff as needed.  7/28: 1605  Palliative care has spoken again to family as follows: Talked to [name] again. She understands his body is giving out. We changed him to comfort measures. Unrestricted visitation (family is coming up this evening). She's hoping he will last a little longer.  7/29: Patient is now full comfort care including no further hemodialysis.  Is to transport to [place] for residential hospice.  Family aware that they would need to pay for ambulance transport since a trip would be greater than  75 miles.  May qualify for inpatient hospice.  TOC has faxed appropriate/requested information.  Family also wishes to have additional conversations with  the palliative team to have additional questions answered.  Code Status: Full Family Communication: [name] sister and daughter via conference telephone call w/ Dr Sloan Leiter leading the discussion DVT prophylaxis: Eliquis  HPI: 74 y.o. 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 Patient treated for Covid pneumonia January 2021 and had been on the vent because of progressive ARDS.  Because of lack of progression he underwent tracheostomy and was [facility].  Was sent back to this facility in May due to trach site bleeding suspected hemoptysis.  He was also noted with sacral decubitus at time of presentation in May.  Since admission he has been treated for recurrent multidrug-resistant Pseudomonas pneumonia and MRSA and enterococcal bacteremia.  On 03/15/2020 patient returned to the ICU for ventilatory and pressor support he developed septic shock and developed acute renal failure requiring dialysis.  He developed sepsis physiology likely secondary to sacral decubitus ulcer with underlying osteomyelitis.    He has been weaned from pressors. PCCM is following for trach weaning with the assistance of SLP.  Patient was liberated from the vent on 03/21/2020 and was transferred to the floor in 03/22/2020.  Due to the lack of insurance approval he cannot go back to [facility].  Social worker is working on Clinical research associate.  He will need to be transition to a cuffless trach which unfortunately has not been accomplished due to recurrent [name] with respiratory failure physiology.  He has been deemed to not be a candidate for outpatient hemodialysis by the nephrologist.  Patient has  not made any progress in mobility OT and PT have signed off.  Several weeks ago he had issues regarding aspiration pneumonitis on top of multidrug-resistant Pseudomonas pneumonia that has been treated with [name]  and [name].  He continues to remain quite weak and is unable to lift arms off the bed and has bilateral upper extremity contractures limiting mobility as well.  SLP has been attempting to utilize PMV but patient exquisitely weak and having difficulty tolerating as well.  Ethics physician and myself did have telephonic meeting with patient's sister.  Plans were to allow for children to visit on 7/21 with plans to make a decision about comfort measures after that.  On 7/21 sister asked for additional time to allow other family members to visit with the patient.  Although sister did initiate conversation with home health agency she still remains conflicted about definite discharge plan and transitioning to comfort measures.  Awaiting other family members to visit patient so they can see how poorly he is doing and hopefully with their encouragement she will be able to move forward with recommended plan to transition to comfort measures  As of 7/28 patient with worsened clinical status.  Concerns patient may need to be transition back to ventilator.  Attending physician had detailed conversation with family regarding necessity to transition to DNR and highly consider transitioning to comfort measures.  Please see above as well as Dr. Nena Alexander detailed note regarding the telephone conversation.  She is also started on low-dose IV Ativan and morphine for comfort.  Per attendings discussion with [name] it is likely  given patient's poor respiratory status that dialysis will be electively withdrawn by the nephrology team.  And as of 7/29 patient is now full comfort measures including no hemodialysis with tentative discharge plan to hopefully obtain bed at residential hospice in [facility] which  is closer to family.  TOC in process of assisting with this plan.  Patient is quite comfortable on IV medications that include Dilaudid and Ativan.  Other end-of-life medications instituted by palliative team on 7/28.  Subjective: Awakens briefly to touch in verbal stimulation.  Appears much more comfortable than yesterday.  Now at his "baseline" level of stable tachypnea  Objective: Vitals:   04/25/20 0743 04/25/20 0800  BP: (!) 95/60   Pulse: 96 98  Resp:  (!) 40  Temp: 97.8 F (36.6 C)   SpO2: 99% 99%    Intake/Output Summary (Last 24 hours) at 04/25/2020 1141 Last data filed at 04/24/2020 1314 Gross per 24 hour  Intake --  Output 1500 ml  Net -1500 ml   Filed Weights   04/24/20 0343 04/24/20 0940 04/24/20 1314  Weight: 66.9 kg 66.9 kg 65.4 kg    Exam:  Constitutional: NAD, calm and appears comfortable Neck: midline cuffed trach in place  Respiratory: Bilateral lung sounds coarse to auscultation with mild tachypnea but improved from yesterday.  FiO2 28% via trach collar. Cardiovascular: Irregular rate with underlying rhythm atrial flutter with controlled ventricular response, no rubs, murmurs thrills or gallops.  Postdialysis upper extremity edema has resolved.  Dialysis catheter right subclavian vein-necessary can leave in place to administer comfort meds after discharge Abdomen: no tenderness, no masses palpated. Bowel sounds positive.  Colostomy in place with light reddish brown unformed stool.  Gastrostomy tube with tube feeding GU: Condom catheter in place draining yellow-colored urine to bedside bag Musculoskeletal:  Extension contracture involving the right wrist and right elbow.  Also flexion contracture of left hand.  Neurologic: CN 2-12 grossly intact. 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  During hospitalization patient has had recurrent episodes of either aspiration or infection related pneumonia, was recently multidrug-resistant Pseudomonas Of note, family had been refusing further conversations with palliative medicine team.  Excellent telephonic meeting with ethics physician Dr. Andria Frames on 7/14.  Please see his note for details.  Sister made aware of no meaningful overall recovery including from a pulmonary standpoint. Unfortunately given recurrent pneumonia and increased secretions with associated generalized weakness/weak cough it is highly doubtful he can transition to cuffless trach at this point.  Overall prognosis is poor.  As of 7/28 respiratory status has worsened and patient continues to aspirate oral secretions and is quite symptomatic.  Discussion held with family as above and patient is now a DNR and will not be mechanically ventilated and CPR will not be initiated.  Family has been wrongly encouraged to transition to comfort measures given patient status is futile and has no hope for any meaningful recovery.  They are also aware that given his respiratory status this also makes him ineligible for ongoing hemodialysis according to recent discussion with [name]  Goals of care: Ethics team meeting on 7/14 as described above.Please see detailed notes from Dr. Andria Frames  On 7/26 [name] also spoke with patient's  sister regarding patient not appropriate for outpatient hemodialysis  7/28: Patient now DNR and medications have been instituted to ensure patient comfort.  Dialysis also discontinued since has transitioned to full comfort measures by the afternoon. Palliative team has been reconsulted and instrumental in assisting  family in accepting current patient prognosis and coping with patient's inevitable death  Hypertrophic cardiomyopathy Patient apparently has had this diagnosis for some time. Echocardiogram completed on 7/20 with hyperdynamic EF of 70% with severe asymmetric LV hypertrophy-interpreting cardiologist confirm diagnosis after chart review of outside records  Diarrhea Noninfectious based on testing Continue cholestyramine twice daily   Mild funguria/oral yeast Urine culture with 30,000 colonies of yeast Completed Diflucan as of 7/19  Sacral osteomyelitis and HD catheter sepsis/  Enterococcal bacteremia: Tunneled catheter removal on 02/21/2020. Has tunneled HD catheter right subclavian-side unremarkable Completed 6 weeks of IV vancomycin and Flagyl 7/8  Paroxysmal atrial fibrillation and flutter/Acquired thrombophilia: Currently rate controlled on metoprolol Anticoagulation previously discontinued due to suspected GI bleed and was resumed 5/31 and patient has tolerated it any signs of bleeding.  Chronic kidney disease stage V now requiring hemodialysis (mwf) 2/2 ATN and AKI from IV gentamicin: On HD since March 2021 status post tunneled catheter placement 02/28/2020 and as of 7/1 site unremarkable. Not a candidate for outpatient hemodialysis and as of 7/28 patient was made full comfort care and dialysis has been discontinued  Chronic normocytic anemia: Due to anemia of chronic renal disease Status post 1 unit of PRBCs Hgb on 7/26 was 9.3  History of nonhemorrhagic CVA: CT of the head showed atrophy and chronic microvascular disease. 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 Documented as full-thickness tissue loss  Severe protein caloric malnutrition: Tube feedings on hold due to concerns of a possible aspiration as above.  Patient is dialysis patient but will allow D5 normal saline at 30 cc/h to  replace tube feeding volume loss Will allow medications per tube with small amounts of flush Nutrition Status: Nutrition Problem: Severe Malnutrition Etiology: chronic illness (chronic respiratory failure related to COVID ARDS) Signs/Symptoms: severe muscle depletion, severe fat depletion Interventions: Prostat, Tube feeding, Juven-continue tube feeding and check residuals every shift      Data Reviewed: Basic Metabolic Panel: Recent Labs  Lab 04/19/20 0822 04/21/20 0329 04/22/20 1030 04/24/20 0850  NA 126* 131* 128* 133*  K 3.6 3.6 3.6 4.2  CL 93* 99 95* 98  CO2 26 23 24 26   GLUCOSE 142* 132* 152* 153*  BUN 95* 81* 123* 91*  CREATININE 2.22* 1.99* 2.45* 2.08*  CALCIUM 9.0 9.3 9.6 9.6  PHOS 3.1 2.3* 3.6 3.1   Liver Function Tests: Recent Labs  Lab 04/19/20 0822 04/21/20 0329 04/22/20 1030 04/24/20 0850  ALBUMIN 1.7* 1.7* 1.8* 1.9*   No results for input(s): LIPASE, AMYLASE in the last 168 hours. No results for input(s): AMMONIA in the last 168 hours. CBC: Recent Labs  Lab 04/19/20 0825 04/22/20 1021 04/24/20 0850  WBC 16.9* 15.8* 13.9*  HGB 8.3* 9.3* 9.8*  HCT 27.8* 30.2* 32.7*  MCV 78.3* 75.5* 76.8*  PLT 290 280 310   Cardiac Enzymes: No results for input(s): CKTOTAL, CKMB, CKMBINDEX, TROPONINI in the last 168 hours. BNP (last 3 results) No results for input(s): BNP in the last 8760 hours.  ProBNP (last 3 results) No results for input(s): PROBNP in the last 8760 hours.  CBG: Recent Labs  Lab 04/23/20 1911 04/23/20 2346 04/24/20 0259 04/24/20 0401 04/24/20  0812  GLUCAP 150* 140* 123* 124* 133*    No results found for this or any previous visit (from the past 240 hour(s)).   Studies: No results found.  Scheduled Meds: . chlorhexidine  15 mL Mouth Rinse BID  . Chlorhexidine Gluconate Cloth  6 each Topical Q0600  . glycopyrrolate  0.3 mg Intravenous BID  .  HYDROmorphone (DILAUDID) injection  0.25 mg Intravenous Q6H  . LORazepam  0.5 mg  Intravenous BID  . mouth rinse  15 mL Mouth Rinse q12n4p   Continuous Infusions:   Principal Problem:   Enterococcal bacteremia Active Problems:   Acute respiratory failure (HCC)   Hemoptysis   Pressure injury of skin   Protein-calorie malnutrition, severe   Decubitus ulcer of sacral region, stage 4 (HCC)   Fever   Palliative care by specialist   Goals of care, counseling/discussion   DNR (do not resuscitate)   Tracheostomy status ([place])   Comfort measures only status   Terminal care   Palliative care encounter     Consultants: PCCM Ethics committee/Dr. Hensel Nephrology  Procedures: Echocardiogram 1. Left ventricular ejection fraction, by estimation, is 60 to 65%. The  left ventricle has normal function. The left ventricle has no regional  wall motion abnormalities. There is severe left ventricular hypertrophy.  Left ventricular diastolic parameters  are consistent with Grade I diastolic dysfunction (impaired relaxation).  2. Right ventricular systolic function is normal. The right ventricular  size is normal. There is mildly elevated pulmonary artery systolic  pressure.  3. Left atrial size was mildly dilated.  4. Right atrial size was mildly dilated.  5. The mitral valve is grossly normal. Trivial mitral valve  regurgitation.  6. The aortic valve is tricuspid. Aortic valve regurgitation is trivial.   Antibiotics: Anti-infectives (From admission, onward)   Start     Dose/Rate Route Frequency Ordered Stop   04/12/20 0900  ceftazidime-avibactam (AVYCAZ) 0.94 g in dextrose 5 % 50 mL IVPB  Status:  Discontinued        0.94 g 25 mL/hr over 2 Hours Intravenous Every 24 hours 04/11/20 1225 04/17/20 1034   04/10/20 2100  ceftazidime-avibactam (AVYCAZ) 0.94 g in dextrose 5 % 50 mL IVPB  Status:  Discontinued        0.94 g 25 mL/hr over 2 Hours Intravenous Every 12 hours 04/10/20 2005 04/11/20 1225   04/08/20 1420  ceftazidime-avibactam (AVYCAZ) 0.94 g in  dextrose 5 % 50 mL IVPB  Status:  Discontinued        0.94 g 25 mL/hr over 2 Hours Intravenous Daily at bedtime 04/08/20 1420 04/10/20 2005   04/07/20 1800  ceftazidime-avibactam (AVYCAZ) 0.94 g in dextrose 5 % 50 mL IVPB  Status:  Discontinued        0.94 g 25 mL/hr over 2 Hours Intravenous Every 12 hours 04/07/20 1322 04/08/20 1420   04/05/20 1000  fluconazole (DIFLUCAN) 40 MG/ML suspension 100 mg  Status:  Discontinued       "Followed by" Linked Group Details   100 mg Per Tube Daily 04/04/20 2203 04/15/20 1248   04/04/20 2300  fluconazole (DIFLUCAN) 40 MG/ML suspension 200 mg       "Followed by" Linked Group Details   200 mg Per Tube  Once 04/04/20 2203 04/04/20 2325   04/01/20 1013  vancomycin (VANCOCIN) 500-5 MG/100ML-% IVPB       Note to Pharmacy: Cherylann Banas   : cabinet override      04/01/20 1013 04/01/20 1253  03/29/20 1552  vancomycin (VANCOCIN) 500-5 MG/100ML-% IVPB       Note to Pharmacy: Herriott, Melisa   : cabinet override      03/29/20 1552 03/29/20 1556   03/29/20 1415  fluconazole (DIFLUCAN) 40 MG/ML suspension 100 mg        100 mg Oral Daily 03/29/20 1408 03/31/20 1015   03/28/20 2000  meropenem (MERREM) 500 mg in sodium chloride 0.9 % 100 mL IVPB        500 mg 200 mL/hr over 30 Minutes Intravenous Every 24 hours 03/28/20 1623 04/04/20 2201   03/25/20 1800  cefTAZidime (FORTAZ) 1 g in sodium chloride 0.9 % 100 mL IVPB  Status:  Discontinued       "Followed by" Linked Group Details   1 g 200 mL/hr over 30 Minutes Intravenous Every 24 hours 03/18/20 1015 03/28/20 1613   03/22/20 0830  vancomycin (VANCOREADY) IVPB 500 mg/100 mL        500 mg 100 mL/hr over 60 Minutes Intravenous  Once 03/22/20 0827 03/22/20 1014   03/20/20 1200  vancomycin (VANCOCIN) IVPB 500 mg/100 ml premix        500 mg 100 mL/hr over 60 Minutes Intravenous Every M-W-F (Hemodialysis) 03/19/20 0941 04/03/20 1500   03/18/20 1800  meropenem (MERREM) 500 mg in sodium chloride 0.9 % 100 mL IVPB        "Followed by" Linked Group Details   500 mg 200 mL/hr over 30 Minutes Intravenous Every 24 hours 03/18/20 1015 03/25/20 0026   03/18/20 1200  vancomycin (VANCOCIN) IVPB 500 mg/100 ml premix        500 mg 100 mL/hr over 60 Minutes Intravenous Every M-W-F (Hemodialysis) 03/18/20 0938 03/18/20 1700   03/18/20 1200  cefTAZidime (FORTAZ) 2 g in sodium chloride 0.9 % 100 mL IVPB  Status:  Discontinued        2 g 200 mL/hr over 30 Minutes Intravenous Every Mon (Hemodialysis) 03/18/20 0939 03/18/20 1015   03/07/20 1800  cefTAZidime (FORTAZ) 2 g in sodium chloride 0.9 % 100 mL IVPB  Status:  Discontinued        2 g 200 mL/hr over 30 Minutes Intravenous Every T-Th-Sa (1800) 03/06/20 1255 03/18/20 1015   03/07/20 1200  vancomycin (VANCOCIN) IVPB 500 mg/100 ml premix  Status:  Discontinued        500 mg 100 mL/hr over 60 Minutes Intravenous Every T-Th-Sa (Hemodialysis) 03/06/20 1255 03/19/20 0941   03/04/20 2000  cefTAZidime (FORTAZ) 2 g in sodium chloride 0.9 % 100 mL IVPB  Status:  Discontinued        2 g 200 mL/hr over 30 Minutes Intravenous Every M-W-F (2000) 03/01/20 1136 03/06/20 1255   03/02/20 1200  vancomycin (VANCOCIN) IVPB 500 mg/100 ml premix        500 mg 100 mL/hr over 60 Minutes Intravenous Every T-Th-Sa (Hemodialysis) 03/01/20 1131 03/02/20 1710   03/01/20 1230  vancomycin (VANCOCIN) IVPB 500 mg/100 ml premix  Status:  Discontinued        500 mg 100 mL/hr over 60 Minutes Intravenous Every M-W-F (Hemodialysis) 03/01/20 1131 03/06/20 1255   02/28/20 1110  vancomycin (VANCOREADY) IVPB 500 mg/100 mL        over 60 Minutes  Continuous PRN 02/28/20 1112 02/28/20 1110   02/28/20 1101  vancomycin (VANCOCIN) 1-5 GM/200ML-% IVPB  Status:  Discontinued       Note to Pharmacy: Lytle Butte   : cabinet override      02/28/20 1101 02/28/20 1327  02/27/20 1800  cefTAZidime (FORTAZ) 1 g in sodium chloride 0.9 % 100 mL IVPB        1 g 200 mL/hr over 30 Minutes Intravenous Every 24 hours  02/27/20 0947 03/03/20 1738   02/27/20 1200  vancomycin (VANCOCIN) IVPB 500 mg/100 ml premix        500 mg 100 mL/hr over 60 Minutes Intravenous Once 02/27/20 0829 02/27/20 1343   02/24/20 1800  vancomycin (VANCOREADY) IVPB 500 mg/100 mL        500 mg 100 mL/hr over 60 Minutes Intravenous  Once 02/24/20 1308 02/24/20 1905   02/22/20 2200  metroNIDAZOLE (FLAGYL) tablet 500 mg  Status:  Discontinued        500 mg Per Tube Every 8 hours 02/22/20 0957 03/30/20 1057   02/22/20 1700  cefTRIAXone (ROCEPHIN) 2 g in sodium chloride 0.9 % 100 mL IVPB  Status:  Discontinued        2 g 200 mL/hr over 30 Minutes Intravenous Every 24 hours 02/22/20 0957 02/27/20 0947   02/22/20 1200  vancomycin (VANCOCIN) IVPB 500 mg/100 ml premix  Status:  Discontinued        500 mg 100 mL/hr over 60 Minutes Intravenous Every T-Th-Sa (Hemodialysis) 02/20/20 1253 02/21/20 1040   02/21/20 1431  vancomycin variable dose per unstable renal function (pharmacist dosing)  Status:  Discontinued         Does not apply See admin instructions 02/21/20 1431 03/01/20 1131   02/21/20 1200  vancomycin (VANCOREADY) IVPB 500 mg/100 mL        500 mg 100 mL/hr over 60 Minutes Intravenous  Once 02/21/20 0956 02/21/20 1253   02/21/20 0714  vancomycin (VANCOCIN) 500-5 MG/100ML-% IVPB  Status:  Discontinued       Note to Pharmacy: Ashley Akin   : cabinet override      02/21/20 0714 02/21/20 1346   02/20/20 1400  vancomycin (VANCOREADY) IVPB 1250 mg/250 mL        1,250 mg 166.7 mL/hr over 90 Minutes Intravenous  Once 02/20/20 1253 02/20/20 1538   02/20/20 1200  levofloxacin (LEVAQUIN) IVPB 500 mg  Status:  Discontinued        500 mg 100 mL/hr over 60 Minutes Intravenous Every 48 hours 02/20/20 1020 02/21/20 0943   02/15/20 1545  vancomycin (VANCOCIN) IVPB 1000 mg/200 mL premix  Status:  Discontinued       "Followed by" Linked Group Details   1,000 mg 200 mL/hr over 60 Minutes Intravenous Every 24 hours 02/14/20 1530 02/14/20 1745    02/15/20 0330  ceFEPIme (MAXIPIME) 2 g in sodium chloride 0.9 % 100 mL IVPB  Status:  Discontinued        2 g 200 mL/hr over 30 Minutes Intravenous Every 12 hours 02/14/20 1531 02/14/20 1531   02/14/20 1531  ceFEPIme (MAXIPIME) 2 g in sodium chloride 0.9 % 100 mL IVPB  Status:  Discontinued        2 g 200 mL/hr over 30 Minutes Intravenous Every 12 hours 02/14/20 1531 02/14/20 1745   02/14/20 1530  ceFEPIme (MAXIPIME) 2 g in sodium chloride 0.9 % 100 mL IVPB  Status:  Discontinued        2 g 200 mL/hr over 30 Minutes Intravenous  Once 02/14/20 1520 02/14/20 1531   02/14/20 1530  vancomycin (VANCOREADY) IVPB 1500 mg/300 mL  Status:  Discontinued       "Followed by" Linked Group Details   1,500 mg 150 mL/hr over 120 Minutes Intravenous  Once 02/14/20 1530 02/14/20 1745        Time spent: 20 minutes    Erin Hearing ANP [facility] Pager [phone]. If 7PM-7AM, please contact night-coverage at [email or web address] 04/25/2020, 11:41 AM  LOS: 71 days

## 2020-04-25 NOTE — Progress Notes (Signed)
Daily Progress Note   Patient Name: Shane Banks       Date: 04/25/2020 DOB: [birth year]  Age: 74 y.o. MRN#: [id number] Attending Physician: Jonetta Osgood, MD Primary Care Physician: [name], MD Admit Date: 02/14/2020  Reason for Consultation/Follow-up: To discuss complex medical decision making related to patient's goals of care  Discussed with bedside RN.  Examined patient.     Subjective: Patient has eyes open and nods and shakes his head to my questions.  VSS.  Appears to be doing better without artificial supplementation of fluids and feedings.  Sounds less wet.  Talked with [name] on the phone twice this am.  She remains hopeful and I support her in doing so.  Explained patient will have "golden days" and bad days.  Today is a golden day.  She would like for him to be transferred to Hospice in [place] if possible.  We talked about the types of support Hospice will provide - they will do everything possible to make him comfortable, but they will not re-start hemodialysis.   We planned another family conference call for 9:30 am on Friday morning.  Discussed with primary team, Erin Hearing, NP.  Discussed with Case Management.  Assessment:  Patient appears more comfortable than yesterday.  On comfort measures.  No invasive interventions.  Stable for transport to [facility]     Patient Profile/HPI:  Per intake H&P --> 53 yo m with PMHx of afib with RVR, BPH, stroke, and COVID ARDS in January requiring trach and LTACH placement at [facility]. Had been tolerating trach collar, but on 5/19 developed hemoptysis and hypoxemic respiratory failure requiring vent. Transferred to [facility] on 5/19.  Hospitalized with bacteremia.  Patient has been at [facility] since  January.  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     Length of Stay: 71   Vital Signs: BP (!) 95/60 (  BP Location: Left Arm)   Pulse 98   Temp 97.8 F (36.6 C) (Axillary)   Resp (!) 40   Ht 5\' 9"  (1.753 m)   Wt 65.4 kg   SpO2 99%   BMI 21.29 kg/m  SpO2: SpO2: 99 % O2 Device: O2 Device: Tracheostomy Collar O2 Flow Rate: O2 Flow Rate (L/min): 5 L/min       Palliative Assessment/Data: 20%     Palliative Care Plan    Recommendations/Plan:  Comfort measures  Comfort feeds (tiny tastes on the tongue if desired)  Excellent oral care  Transfer to Hospice in [place] when bed is available.    Will adjust medications to DC po pills and ensure comfort.  Code Status:  DNR  Prognosis:   < 2 weeks.  Likely days.   Discharge Planning:  Hospice facility  Care plan was discussed with [facility], Primary team, family.  Thank you for allowing the Palliative Medicine Team to assist in the care of this patient.  Total time spent:  35 min.     Greater than 50%  of this time was spent counseling and coordinating care related to the above assessment and plan.  Florentina Jenny, PA-C Palliative Medicine  Please contact Palliative MedicineTeam phone at [phone] for questions and concerns between 7 am - 7 pm.   Please see AMION for individual provider pager numbers.

## 2020-04-25 NOTE — TOC Progression Note (Signed)
Transition of Care [facility]) - Progression Note    Patient Details  Name: Shane Banks MRN: [id number] Date of Birth: 10-06-1945  Transition of Care [facility]) CM/SW [place], RN Phone Number: 04/25/2020, 4:35 PM  Clinical Narrative:    Shane Banks to Sister Shane Banks made her aware of  information of all information faxed to [facility] hosp[facility] for review  Social worker Shane Banks at [phone]( nurses station).   Transport is scheduled at 1400 from [phone] [facility] call them if it will not happen. The family is scheduled to meet at 0900 with palliative.  Expected Discharge Plan: Long Term Acute Care (LTAC) Barriers to Discharge: Continued Medical Work up, SNF Pending bed offer  Expected Discharge Plan and Services Expected Discharge Plan: [facility] (LTAC)       Living arrangements for the past 2 months: Post-Acute Facility                                       Social Determinants of Health (SDOH) Interventions    Readmission Risk Interventions No flowsheet data found.

## 2020-04-25 NOTE — Care Management (Signed)
I spoke to Jackelyn Poling the sister of hte patient and s  2120.20 was quoted for transport by [facility] up front for transportation to [facility]- no cost up front is available and has pt information is available tomorrow at 2pm. [phone]

## 2020-04-25 NOTE — Treatment Plan (Signed)
Transitioning to hospice with no further plans of hemodialysis.  Will sign off from nephrology perspective.  Thank you for involving [place] in the care of this patient.  Please call with any questions/concerns.  Gean Quint, MD [facility]

## 2020-04-25 NOTE — Progress Notes (Signed)
Nutrition Brief Note  Chart reviewed. Pt now transitioning to comfort care.  No further nutrition interventions warranted at this time.  Please re-consult as needed.   Mariana Single RD, LDN Clinical Nutrition Pager listed in [place]

## 2020-04-26 DIAGNOSIS — B952 Enterococcus as the cause of diseases classified elsewhere: Secondary | ICD-10-CM | POA: Diagnosis not present

## 2020-04-26 DIAGNOSIS — Z515 Encounter for palliative care: Secondary | ICD-10-CM | POA: Diagnosis not present

## 2020-04-26 DIAGNOSIS — R7881 Bacteremia: Secondary | ICD-10-CM | POA: Diagnosis not present

## 2020-04-26 DIAGNOSIS — J9600 Acute respiratory failure, unspecified whether with hypoxia or hypercapnia: Secondary | ICD-10-CM | POA: Diagnosis not present

## 2020-04-26 DIAGNOSIS — N186 End stage renal disease: Secondary | ICD-10-CM | POA: Diagnosis not present

## 2020-04-26 LAB — SARS CORONAVIRUS 2 BY RT PCR (HOSPITAL ORDER, PERFORMED IN ~~LOC~~ HOSPITAL LAB): SARS Coronavirus 2: NEGATIVE

## 2020-04-26 NOTE — TOC Transition Note (Signed)
[place] Spoke to [place] report.

## 2020-04-26 NOTE — Progress Notes (Signed)
Daily Progress Note   Patient Name: Shane Banks       Date: 04/26/2020 DOB: 1946-05-29  Age: 74 y.o. MRN#: [id number] Attending Physician: Jonetta Osgood, MD Primary Care Physician: [name], MD Admit Date: 02/14/2020  Reason for Consultation/Follow-up: To discuss complex medical decision making related to patient's goals of care  Thirty minute conference call held on [place] with patient's family and myself.  Visited bedside to allow family to speak to patient on speaker phone.  Subjective: Visited patient at bedside.  Discussed his current status with RN Tammi Klippel.  Patient is not speaking this morning, but does open his eyes slightly to acknowledge me speaking to him.     His family [name], Lilburn Straw, Primitivo Gauze, parents, and more) join the call.  Questions regarding continuing dialysis were asked and answered.  I tried to gently but clearly explain that Mr. Royce is actively dying.  Attempting HD would cause him harm and hasten his death.  Family asks about what type of facility he is discharging to and what treatment he will receive there.  I explained Hospice facility and the care provided.  Family expressed grief.   I talked with the family about how [name] [facility]) had strongly advocated for him and ensured he received the most/best medical care every step of the way.  Allow time and opportunity for the family to speak with each other and express their thanks.  Family each spoke to Mr. Fellner to tell him they loved him - as the speaker phone was next to his ear.   Assessment: Patient actively dying.   Length of Stay: 72   Vital Signs: BP 121/65 (BP Location: Left Arm)   Pulse 89   Temp (!) 97.2 F (36.2 C) (Axillary)   Resp 21   Ht 5\' 9"  (1.753 m)   Wt 65.4 kg    SpO2 96%   BMI 21.29 kg/m  SpO2: SpO2: 96 % O2 Device: O2 Device: Tracheostomy Collar O2 Flow Rate: O2 Flow Rate (L/min): 5 L/min       Palliative Assessment/Data: 10%     Palliative Care Plan    Recommendations/Plan: Patient appears comfortable. DC to [place] today.  Code Status:  DNR  Prognosis:   Hours - Days   Discharge Planning:  Hospice facility  Care plan was  discussed with RN and Family  Thank you for allowing the Palliative Medicine Team to assist in the care of this patient.  Total time spent:  45 min.     Greater than 50%  of this time was spent counseling and coordinating care related to the above assessment and plan.  Florentina Jenny, PA-C Palliative Medicine  Please contact Palliative MedicineTeam phone at [phone] for questions and concerns between 7 am - 7 pm.   Please see AMION for individual provider pager numbers.

## 2020-04-26 NOTE — Discharge Summary (Signed)
Physician Discharge Summary  Shane Banks YBO:[id number] DOB: 11-Aug-1946 DOA: 02/14/2020  PCP: [name], MD  Admit date: 02/14/2020 Discharge date: 04/26/2020  Time spent: >30 minutes  Recommendations for Outpatient Follow-up:  1. Plan is to discharge to residential hospice in [facility] for end-of-life care.  Unless otherwise required to be discontinued prior to arrival to hospice facility will continue HD catheter to be used for comfort IV medications.   Discharge Diagnoses:  Active Problems:   Acute respiratory failure (HCC)   Pressure injury of skin   Protein-calorie malnutrition, severe   Decubitus ulcer of sacral region, stage 4 (HCC)   Palliative care by specialist   Goals of care, counseling/discussion   DNR (do not resuscitate)   Tracheostomy status ([place])   Comfort measures only status   Terminal care   Palliative care encounter   Hypertrophic obstructive cardiomyopathy ([place])   Sacral osteomyelitis ([place])   CKD (chronic kidney disease) stage V requiring chronic dialysis ([place])   Pneumonia due to Pseudomonas [facility])   Discharge Condition: Stable  Diet recommendation: Comfort feeds if able to tolerate-was on continuous tube feedings while in the hospital  Filed Weights   04/24/20 0343 04/24/20 0940 04/24/20 1314  Weight: 66.9 kg 66.9 kg 65.4 kg    History of present illness:  73 y.o.malepast medical history significant of chronic respiratory failure status post tracheostomy, stage V chronic kidney disease now requiring dialysis secondary to ATN/AKI from gentamicin during treatment for secondary pneumonia in context of Covid with ventilator dependent respiratory failure, chronic atrial fibrillation, nonhemorrhagic stroke.Patient treated for Covid pneumonia January 2021 and had been on the vent because of progressive ARDS. Because of lack of progression he underwent tracheostomy and was [facility].  Was sent back to this facility  in May due to trach site bleeding suspected hemoptysis.  He was also noted with sacral decubitus at time of presentation in May.  Since admission he has been treated for recurrent multidrug-resistant Pseudomonas pneumonia and MRSA and enterococcal bacteremia. On 03/15/2020 patient returned to the ICU for ventilatory and pressor support he developed septic shock and developed acute renal failure requiring dialysis.  He developed sepsis physiology likely secondary to sacral decubitus ulcer with underlying osteomyelitis.   He has been weaned from pressors.PCCM is following for trach weaning with the assistance of SLP. Patient was liberated from the vent on 03/21/2020 and was transferred to the floor in 03/22/2020. Due to the lack of insurance approval he cannot go back to [facility]. Social worker is working on Clinical research associate.  He will need to be transition to a cuffless trach which unfortunately has not been accomplished due to recurrent [name] with respiratory failure physiology.  He has been deemed to not be a candidate for outpatient hemodialysis by the nephrologist.  Patient has not made any progress in mobility OT and PT have signed off.  Several weeks ago he had issues regarding aspiration pneumonitis on top of multidrug-resistant Pseudomonas pneumonia that has been treated with [name]  and [name].  He continues to remain quite weak and is unable to lift arms off the bed and has bilateral upper extremity contractures limiting mobility as well.  SLP has been attempting to utilize PMV but patient exquisitely weak and having difficulty tolerating as well.  Ethics physician and myself did have telephonic meeting with patient's sister.  Plans were to allow for children to visit on 7/21 with plans to make a decision about comfort measures after that.  On 7/21 sister asked for additional time to allow other family members to visit with the patient.  Although sister did initiate conversation with  home health agency she still remains conflicted about definite discharge plan and transitioning to comfort measures.  Awaiting other family members to visit patient so they can see how poorly he is doing and hopefully with their encouragement she will be able to move forward with recommended plan to transition to comfort measures  As of 7/28 patient with worsened clinical status.  Concerns patient may need to be transition back to ventilator.  Attending physician had detailed conversation with family regarding necessity to transition to DNR and highly consider transitioning to comfort measures.  Please see above as well as Dr. Nena Alexander detailed note regarding the telephone conversation.  She is also started on low-dose IV Ativan and morphine for comfort.  Per attendings discussion with [name] it is likely given patient's poor respiratory status that dialysis will be electively withdrawn by the nephrology team.  And as of 7/29 patient is now full comfort measures including no hemodialysis with tentative discharge plan to hopefully obtain bed at residential hospice in [facility] which is closer to family.  TOC in process of assisting with this plan.  Patient is quite comfortable on IV medications that include Dilaudid and Ativan.  Other end-of-life medications instituted by palliative team on 7/28.  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  During hospitalization patient has had recurrent episodes of either aspiration or infection related pneumonia, was recently multidrug-resistant Pseudomonas Of note, family had been refusing further conversations with palliative medicine team.  Excellent telephonic meeting with ethics physician Dr. Andria Frames on 7/14.  Please see his note for details.  Sister  made aware of no meaningful overall recovery including from a pulmonary standpoint. Unfortunately given recurrent pneumonia and increased secretions with associated generalized weakness/weak cough it is highly doubtful he can transition to cuffless trach at this point.  Overall prognosis is poor.  As of 7/28 respiratory status has worsened and patient continues to aspirate oral secretions and is quite symptomatic.  Discussion held with family as above and patient is now a DNR and will not be mechanically ventilated and CPR will not be initiated.    Goals of care: Ethics team meeting on 7/14 as described above.Please see detailed notes from Dr. Andria Frames  On 7/26 [name] also spoke with patient's sister regarding patient not appropriate for outpatient hemodialysis  7/28: Patient now DNR and medications have been instituted to ensure patient comfort.  Dialysis also discontinued since has transitioned to full comfort measures by the afternoon. Palliative team has been reconsulted and instrumental in assisting family in accepting current patient prognosis and coping with patient's inevitable death.  Hypertrophic cardiomyopathy Patient apparently has had this diagnosis for some time. Echocardiogram completed on 7/20 with hyperdynamic EF of 70% with severe asymmetric LV hypertrophy-interpreting cardiologist confirmed diagnosis after chart review of outside records  Diarrhea Noninfectious based on testing Continue cholestyramine twice daily   Mild funguria/oral yeast Urine culture with 30,000 colonies of yeast Completed Diflucan as of 7/19  Sacral osteomyelitis and HD catheter sepsis/Enterococcal bacteremia: Initial tunneled catheter removed on 02/21/2020. Has tunneled HD catheter right subclavian-side unremarkable-unless otherwise contraindicated at hospice facility would like to keep catheter in place to allow access for comfort IV medication  Paroxysmal atrial fibrillation  and  flutter/Acquired thrombophilia: Beta-blocker and anticoagulation discontinued in context of focus on comfort measures  Chronic kidney disease stage V now requiring hemodialysis (mwf) 2/2 ATN and AKI from IV gentamicin: On HD since March 2021 status post tunneled catheter placement 02/28/2020 and as of 7/1 site unremarkable. Not a candidate for outpatient hemodialysis and as of 7/28 patient was made full comfort care and dialysis has been discontinued  Chronic normocytic anemia: Due to anemia of chronic renal disease Status post 1 unit of PRBCs Hgb on 7/26 was 9.3  History of nonhemorrhagic CVA: CT of the head showed atrophy and chronic microvascular disease. Anticoagulation discontinued given comfort status  Stage IV sacral pressure ulcer with osteomyelitis: Wound care discontinued secondary to focus on comfort measures.  Severe protein caloric malnutrition: Tube feedings on hold due to concerns of a possible aspiration as above.  Patient is dialysis patient but will allow D5 normal saline at 30 cc/h to replace tube feeding volume loss Will allow medications per tube with small amounts of flush Nutrition Status: Nutrition Problem: Severe Malnutrition Etiology: chronic illness (chronic respiratory failure related to COVID ARDS) Signs/Symptoms: severe muscle depletion, severe fat depletion Interventions: Prostat, Tube feeding, Juven-continue tube feeding and check residuals every shift  **Anticipate would not resume tube feedings upon arrival to hospice facility.  Procedures: Echocardiogram  Consultations: PCCM Ethics committee/Dr. Hensel Nephrology Palliative medicine  Discharge Exam: Vitals:   04/26/20 0808 04/26/20 0900  BP: 121/65   Pulse: 89   Resp: 21   Temp: (!) 97.2 F (36.2 C)   SpO2: 96% 96%   Constitutional: NAD, calm and appears comfortable Neck: midline cuffed trach in place  Respiratory: Bilateral lung sounds coarse to auscultation -no further  tachypnea.  FiO2 28% via trach collar.  Respiratory status otherwise stable Cardiovascular: Irregular rate with underlying rhythm atrial flutter with controlled ventricular response, no rubs, murmurs thrills or gallops. Dialysis catheter right subclavian vein-necessary can leave in place to administer comfort meds after discharge Abdomen: no tenderness, no masses palpated. Bowel sounds positive.  Colostomy in place with light reddish brown unformed stool.  Gastrostomy tube with tube feeding GU: Condom catheter in place draining yellow-colored urine to bedside bag Musculoskeletal:  Extension contracture involving the right wrist and right elbow.  Also flexion contracture of left hand.  Neurologic: CN 2-12 grossly intact. Sensation intact, strength upper extremities 0-1/5, lower extremities 1/5 Psychiatric: Patient nonverbal secondary to trach -does awaken and will interact when spoken to.  Discharge Instructions   Discharge Instructions    Increase activity slowly   Complete by: As directed    No wound care   Complete by: As directed      Allergies as of 04/26/2020      Reactions   Aspirin Rash   Cephalexin Other (See Comments)   dizziness   Penicillins    Itching,swelling       Medication List    STOP taking these medications   amLODipine 10 MG tablet Commonly known as: NORVASC   ascorbic acid 500 MG tablet Commonly known as: VITAMIN C   cholecalciferol 25 MCG (1000 UNIT) tablet Commonly known as: VITAMIN D3   Fish Oil 1000 MG Caps   guaifenesin 400 MG Tabs tablet Commonly known as: HUMIBID E   levothyroxine 25 MCG tablet Commonly known as: SYNTHROID   melatonin 3 MG Tabs tablet   nutrition supplement (JUVEN) Pack   PROSTATE PO   Protonix 40 MG tablet Generic drug: pantoprazole   sertraline 25 MG tablet  Commonly known as: ZOLOFT   tamsulosin 0.4 MG Caps capsule Commonly known as: FLOMAX     TAKE these medications   acetaminophen 160 MG/5ML  solution Commonly known as: TYLENOL Place 20.3 mLs (650 mg total) into feeding tube every 4 (four) hours as needed for fever (Temp > 101.0 F.).   glycopyrrolate 0.2 MG/ML injection Commonly known as: ROBINUL Inject 1.5 mLs (0.3 mg total) into the vein 2 (two) times daily.   glycopyrrolate 1 MG tablet Commonly known as: ROBINUL Take 1 tablet (1 mg total) by mouth every 4 (four) hours as needed (excessive secretions).   glycopyrrolate 0.2 MG/ML injection Commonly known as: ROBINUL Inject 1 mL (0.2 mg total) into the skin every 4 (four) hours as needed (excessive secretions).   haloperidol 0.5 MG tablet Commonly known as: HALDOL Take 1 tablet (0.5 mg total) by mouth every 4 (four) hours as needed for agitation (or delirium).   haloperidol 2 MG/ML solution Commonly known as: HALDOL Place 0.3 mLs (0.6 mg total) under the tongue every 4 (four) hours as needed for agitation (or delirium).   haloperidol lactate 5 MG/ML injection Commonly known as: HALDOL Inject 0.1 mLs (0.5 mg total) into the vein every 4 (four) hours as needed (or delirium).   HYDROmorphone 1 MG/ML injection Commonly known as: DILAUDID Inject 0.5-1 mLs (0.5-1 mg total) into the vein every 12 (twelve) hours as needed for severe pain (increased rate of breathing or for discomfort).   ipratropium-albuterol 0.5-2.5 (3) MG/3ML Soln Commonly known as: DUONEB Take 3 mLs by nebulization every 6 (six) hours as needed.   LORazepam 2 MG/ML injection Commonly known as: ATIVAN Inject 0.25 mLs (0.5 mg total) into the vein every 6 (six) hours as needed for anxiety or sedation.   LORazepam 2 MG/ML injection Commonly known as: ATIVAN Inject 0.25 mLs (0.5 mg total) into the vein 2 (two) times daily.   ondansetron 4 MG disintegrating tablet Commonly known as: ZOFRAN-ODT Take 1 tablet (4 mg total) by mouth every 6 (six) hours as needed for nausea.   ondansetron 4 MG/2ML Soln injection Commonly known as: ZOFRAN Inject 2 mLs (4  mg total) into the vein every 6 (six) hours as needed for nausea.   polyvinyl alcohol 1.4 % ophthalmic solution Commonly known as: LIQUIFILM TEARS Place 1 drop into both eyes 4 (four) times daily as needed for dry eyes.      Allergies  Allergen Reactions   Aspirin Rash   Cephalexin Other (See Comments)    dizziness   Penicillins     Itching,swelling       The results of significant diagnostics from this hospitalization (including imaging, microbiology, ancillary and laboratory) are listed below for reference.    Significant Diagnostic Studies: DG Chest 1 View  Result Date: 04/07/2020 CLINICAL DATA:  Shortness of breath EXAM: CHEST  1 VIEW COMPARISON:  April 04, 2020 FINDINGS: The tracheostomy tube and right dialysis catheter stable. Decreasing interstitial opacities. Persistent but decreased opacity in the left base with probable associated effusion. No other interval changes. IMPRESSION: 1. Support apparatus as above. 2. Decreasing interstitial opacities. 3. Decreasing effusion and opacity in the left base. Electronically Signed   By: Dorise Bullion III M.D   On: 04/07/2020 11:27   DG Chest 1 View  Result Date: 03/28/2020 CLINICAL DATA:  Bilateral pneumonia EXAM: CHEST  1 VIEW COMPARISON:  03/21/2020 FINDINGS: Tracheostomy tube and right dialysis catheter remain in place, unchanged. Heart is borderline in size. Diffuse left lung airspace disease  and patchy airspace disease throughout the right lung. Possible small left effusion. No change since prior study. IMPRESSION: No significant change since prior study. Electronically Signed   By: Rolm Baptise M.D.   On: 03/28/2020 16:16   DG Abd 1 View  Result Date: 04/02/2020 CLINICAL DATA:  Ileus EXAM: ABDOMEN - 1 VIEW COMPARISON:  Portable exam 1444 hours compared to 11/20/2019 FINDINGS: Air-filled loops of nondistended bowel in the mid abdomen. No bowel dilatation or bowel wall thickening. G-tube present. Bones demineralized. Patient  rotated to the LEFT. Scattered atherosclerotic calcifications in pelvis. IMPRESSION: Nonobstructive bowel gas pattern. Electronically Signed   By: Lavonia Dana M.D.   On: 04/02/2020 15:08   DG CHEST PORT 1 VIEW  Result Date: 04/16/2020 CLINICAL DATA:  Short of breath, congestion, bleeding around tracheostomy site EXAM: PORTABLE CHEST 1 VIEW COMPARISON:  04/07/2020 FINDINGS: Single frontal view of the chest demonstrates stable tracheostomy tube and right internal jugular dialysis catheter. The cardiac silhouette is unchanged. There is persistent consolidation and/or effusion at the left lung base. Stable interstitial prominence at the lung bases. No pneumothorax. IMPRESSION: 1. Stable support devices. 2. Persistent left basilar consolidation and/or effusion. Electronically Signed   By: Randa Ngo M.D.   On: 04/16/2020 19:26   DG Chest Port 1 View  Result Date: 04/04/2020 CLINICAL DATA:  Abnormal respirations.  Tracheostomy tube. EXAM: PORTABLE CHEST 1 VIEW COMPARISON:  04/03/2020, 03/28/2020.  CT 12/21/2019. FINDINGS: Stable prominence of the right upper mediastinum consistent prominent great vessels. Tracheostomy tube in stable position. Dual lumen central catheter noted with tip over lower right atrium in unchanged position. Heart size stable. Diffuse bilateral pulmonary infiltrates again noted. Left lower lobe atelectasis again noted. Tiny bilateral pleural effusions cannot be excluded. No pneumothorax. IMPRESSION: 1. Tracheostomy tube in stable position. Dual-lumen central catheter with tip over lower right atrium in unchanged position. 2. Diffuse bilateral pulmonary infiltrates again noted. Left lower lobe atelectasis again noted. Tiny bilateral pleural effusions cannot be excluded. Chest appears unchanged from prior exam. Electronically Signed   By: Marcello Moores  Register   On: 04/04/2020 06:56   DG CHEST PORT 1 VIEW  Result Date: 04/02/2020 CLINICAL DATA:  Dyspnea. EXAM: PORTABLE CHEST 1 VIEW  COMPARISON:  Prior chest radiograph 03/28/2020 and earlier FINDINGS: Tracheostomy tube and right-sided dialysis catheter, unchanged in position. Similar appearance of airspace disease predominantly within the left mid to lower lung field. As before, a small left pleural effusion is difficult to exclude. No evidence of pneumothorax. No acute bony abnormality. IMPRESSION: No significant interval change as compared to chest radiograph 03/28/2020. Tracheostomy tube and right-sided dialysis catheter unchanged in position. Similar appearance of airspace disease, predominantly within the left mid to lower lung field. As before, a small left pleural effusion is difficult to exclude. Electronically Signed   By: Kellie Simmering DO   On: 04/02/2020 08:14   ECHOCARDIOGRAM COMPLETE  Result Date: 04/16/2020    ECHOCARDIOGRAM REPORT   Patient Name:   MITSUO BUDNICK Date of Exam: 04/16/2020 Medical Rec #:  [id number]    Height:       69.0 in Accession #:    [id number]   Weight:       146.6 lb Date of Birth:  [birth year]    BSA:          1.811 m Patient Age:    58 years     BP:           137/69 mmHg Patient Gender: M  HR:           86 bpm. Exam Location:  Inpatient Procedure: 2D Echo, Cardiac Doppler and Color Doppler Indications:    R01.1 Murmur  History:        Patient has prior history of Echocardiogram examinations, most                 recent 02/12/2020. Arrythmias:Atrial Fibrillation;                 Signs/Symptoms:Fever, Bacteremia and Dizziness/Lightheadedness.                 History of Covid 19 infection and pneumonia. Respiratory                 failure.  Sonographer:    Roseanna Rainbow RDCS Referring Phys: [id number] Samella Parr  Sonographer Comments: Technically difficult study due to poor echo windows. Patient has trach collar. Apical images extremely lateral. Could not turn patient. IMPRESSIONS  1. There is severe asymmetric hypertrophy of the septum up to 2.0 cm. There is a mid cavitary gradient up to 10 mmHG due to  cavity obliteration in systole. There is no SAM of the mitral valve. Findings are concerning for hypertrophic cardiomyopathy, reverse curvature type. Per review of care everywhere, the patient was last seen by cardiology on 07/20/2017 at [facility] [place] and this is a well-known diagnosis. Left ventricular ejection fraction, by estimation, is 70 to 75%. The left ventricle has hyperdynamic function. The left ventricle has no regional wall motion abnormalities. There is severe asymmetric left ventricular hypertrophy of the septal segment. Left ventricular diastolic function could not be evaluated.  2. Right ventricular systolic function is mildly reduced. The right ventricular size is normal. Tricuspid regurgitation signal is inadequate for assessing PA pressure.  3. The mitral valve is degenerative. No evidence of mitral valve regurgitation. No evidence of mitral stenosis.  4. The aortic valve is tricuspid. Aortic valve regurgitation is not visualized. No aortic stenosis is present.  5. The inferior vena cava is normal in size with greater than 50% respiratory variability, suggesting right atrial pressure of 3 mmHg. Comparison(s): Changes from prior study are noted. Atrial flutter is now present. Hypertrophic cardiomyopathy is still present. Conclusion(s)/Recommendation(s): Findings consistent with hypertrophic cardiomyopathy. FINDINGS  Left Ventricle: There is severe asymmetric hypertrophy of the septum up to 2.0 cm. There is a mid cavitary gradient up to 10 mmHG due to cavity obliteration in systole. There is no SAM of the mitral valve. Findings are concerning for hypertrophic cardiomyopathy, reverse curvature type. Per review of care everywhere, the patient was last seen by cardiology on 07/20/2017 at [facility] [place]) and this is a well-known diagnosis. Left ventricular ejection fraction, by estimation, is 70 to 75%. The left ventricle has hyperdynamic function. The left ventricle has no  regional wall motion abnormalities. The left ventricular internal cavity size was normal in size. There is severe asymmetric left ventricular hypertrophy of the septal segment. Left ventricular diastolic function could not be evaluated due to atrial fibrillation. Left ventricular diastolic function could not be evaluated. Right Ventricle: The right ventricular size is normal. No increase in right ventricular wall thickness. Right ventricular systolic function is mildly reduced. Tricuspid regurgitation signal is inadequate for assessing PA pressure. Left Atrium: Left atrial size was normal in size. Right Atrium: Right atrial size was normal in size. Pericardium: Trivial pericardial effusion is present. Mitral Valve: The mitral valve is degenerative in appearance. Mild mitral annular calcification. No  evidence of mitral valve regurgitation. No evidence of mitral valve stenosis. Tricuspid Valve: The tricuspid valve is grossly normal. Tricuspid valve regurgitation is mild . No evidence of tricuspid stenosis. Aortic Valve: The aortic valve is tricuspid. Aortic valve regurgitation is not visualized. No aortic stenosis is present. Pulmonic Valve: The pulmonic valve was grossly normal. Pulmonic valve regurgitation is not visualized. No evidence of pulmonic stenosis. Aorta: The aortic root is normal in size and structure. Venous: The inferior vena cava is normal in size with greater than 50% respiratory variability, suggesting right atrial pressure of 3 mmHg. IAS/Shunts: The atrial septum is grossly normal.  LEFT VENTRICLE PLAX 2D LVIDd:         3.60 cm     Diastology LVIDs:         2.20 cm     LV e' lateral:   12.60 cm/s LV PW:         1.40 cm     LV E/e' lateral: 6.9 LV IVS:        2.00 cm     LV e' medial:    6.96 cm/s LVOT diam:     1.80 cm     LV E/e' medial:  12.5 LV SV:         40 LV SV Index:   22 LVOT Area:     2.54 cm  LV Volumes (MOD) LV vol d, MOD A2C: 49.8 ml LV vol d, MOD A4C: 38.4 ml LV vol s, MOD A2C: 11.3  ml LV vol s, MOD A4C: 11.3 ml LV SV MOD A2C:     38.5 ml LV SV MOD A4C:     38.4 ml LV SV MOD BP:      31.4 ml RIGHT VENTRICLE            IVC RV S prime:     9.79 cm/s  IVC diam: 2.00 cm TAPSE (M-mode): 1.6 cm LEFT ATRIUM             Index       RIGHT ATRIUM           Index LA diam:        4.90 cm 2.71 cm/m  RA Area:     14.50 cm LA Vol (A2C):   60.1 ml 33.19 ml/m RA Volume:   33.70 ml  18.61 ml/m LA Vol (A4C):   17.2 ml 9.50 ml/m LA Biplane Vol: 33.1 ml 18.28 ml/m  AORTIC VALVE LVOT Vmax:   108.00 cm/s LVOT Vmean:  79.800 cm/s LVOT VTI:    0.157 m  AORTA Ao Root diam: 4.10 cm MITRAL VALVE MV Area (PHT): 4.15 cm    SHUNTS MV Decel Time: 183 msec    Systemic VTI:  0.16 m MV E velocity: 86.83 cm/s  Systemic Diam: 1.80 cm Eleonore Chiquito MD Electronically signed by Eleonore Chiquito MD Signature Date/Time: 04/16/2020/6:30:28 PM    Final     Microbiology: No results found for this or any previous visit (from the past 240 hour(s)).   Labs: Basic Metabolic Panel: Recent Labs  Lab 04/21/20 0329 04/22/20 1030 04/24/20 0850  NA 131* 128* 133*  K 3.6 3.6 4.2  CL 99 95* 98  CO2 23 24 26   GLUCOSE 132* 152* 153*  BUN 81* 123* 91*  CREATININE 1.99* 2.45* 2.08*  CALCIUM 9.3 9.6 9.6  PHOS 2.3* 3.6 3.1   Liver Function Tests: Recent Labs  Lab 04/21/20 0329 04/22/20 1030 04/24/20 0850  ALBUMIN 1.7* 1.8* 1.9*  No results for input(s): LIPASE, AMYLASE in the last 168 hours. No results for input(s): AMMONIA in the last 168 hours. CBC: Recent Labs  Lab 04/22/20 1021 04/24/20 0850  WBC 15.8* 13.9*  HGB 9.3* 9.8*  HCT 30.2* 32.7*  MCV 75.5* 76.8*  PLT 280 310   Cardiac Enzymes: No results for input(s): CKTOTAL, CKMB, CKMBINDEX, TROPONINI in the last 168 hours. BNP: BNP (last 3 results) No results for input(s): BNP in the last 8760 hours.  ProBNP (last 3 results) No results for input(s): PROBNP in the last 8760 hours.  CBG: Recent Labs  Lab 04/23/20 1911 04/23/20 2346 04/24/20 0259  04/24/20 0401 04/24/20 0812  GLUCAP 150* 140* 123* 124* 133*       Signed:  Erin Hearing ANP  [facility] 04/26/2020, 9:53 AM

## 2020-04-26 NOTE — TOC Transition Note (Signed)
Transition of Care [facility]) - CM/SW Discharge Note   Patient Details  Name: Shane Banks MRN: [id number] Date of Birth: 11-07-1945  Transition of Care [facility]) CM/SW Contact:  Pollie Friar, RN Phone Number: 04/26/2020, 10:40 AM   Clinical Narrative:    [facility] has accepted the patient for residential hospice admission. Pt has transportation arranged for 2 pm through [employer] transportation and Lena at [employer] is in agreement with the timing. CM will call and update/ verify with [facility].  CM will call and updated the patient sister as well.  Erline Levine is requesting a new covid test prior to d/c. CM has ordered and bedside RN updated.  D/c summary and AVS faxed to [facility] at [place]: [phone]. D/c packet in his chart.  Number for report: [phone] and ask for Richard.   Final next level of care: [place] Barriers to Discharge: No Barriers Identified   Patient Goals and CMS Choice        Discharge Placement                Patient to be transferred to facility by: [facility] transportation Name of family member notified: sister Patient and family notified of of transfer: 04/26/20  Discharge Plan and Services                                     Social Determinants of Health ([place]) Interventions     Readmission Risk Interventions No flowsheet data found.

## 2020-05-29 DEATH — deceased

## 2021-09-01 IMAGING — DX DG CHEST 1V PORT
1 series · 1 of 1 positions shown · non-contrast
Comparison: None.

CLINICAL DATA: Tracheostomy, [id number]

EXAM:
PORTABLE CHEST 1 VIEW

[chest ap]
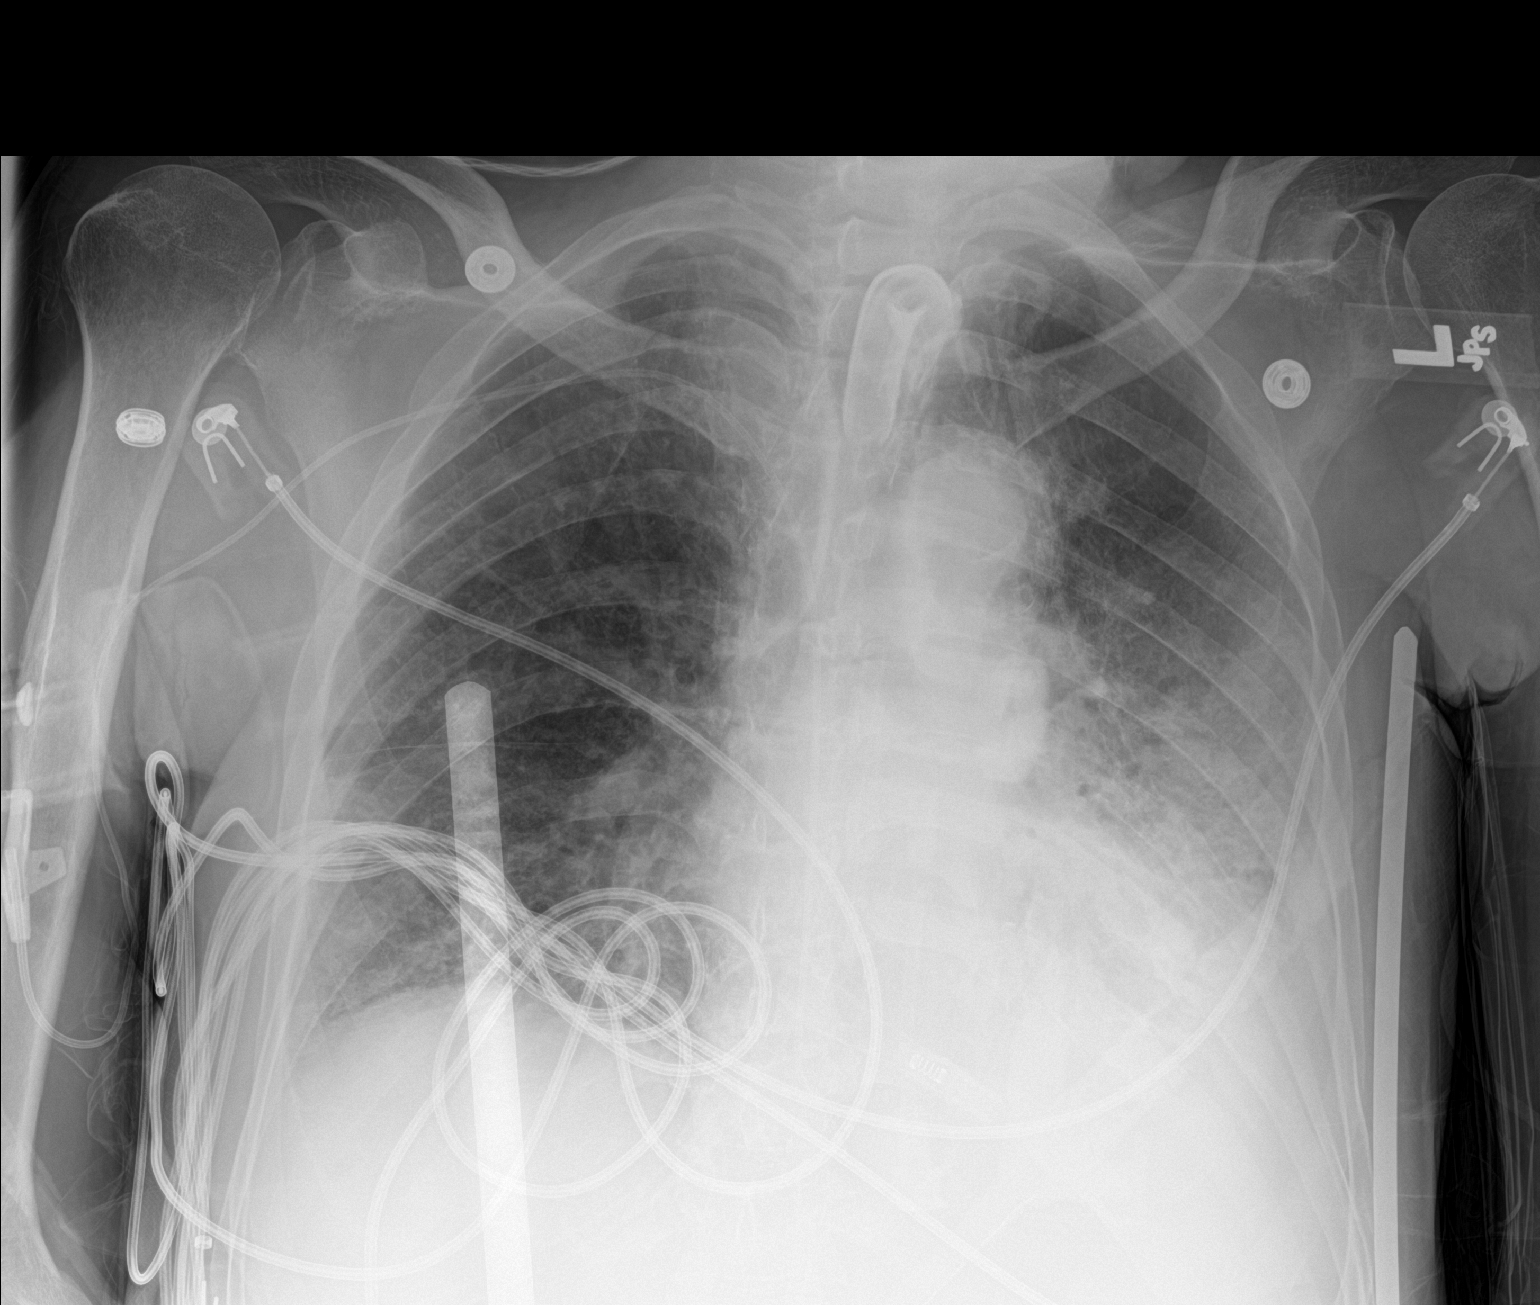

[1 of 1 positions shown; findings below may reference images not displayed]

FINDINGS: Tracheostomy tube terminates in the mid to upper trachea, 6 cm from
the carina. Right upper extremity PICC tip terminates in the right
atrium. Telemetry leads and additional support devices overlie the
chest.

Dense mixed interstitial and airspace opacity is noted in the left
mid to lower lung and retrocardiac space. Milder changes noted in
the right lung base. Suspect left effusion. No visible right pleural
fluid. No pneumothorax. Atherosclerotic calcification of the aorta.
Cardiomediastinal contours are otherwise unremarkable for the
portable technique. No acute osseous or soft tissue abnormality.
Degenerative changes are present in the imaged spine and shoulders.
IMPRESSION: Extensive bilateral interstitial and airspace opacities most
pronounced in the left mid to lower lung.

Support devices as above.

## 2021-11-01 IMAGING — DX DG CHEST 1V PORT
1 series · 1 of 1 positions shown · non-contrast
Comparison: January 11, 2020.

CLINICAL DATA: Pneumonia

EXAM:
PORTABLE CHEST 1 VIEW

[chest ap]
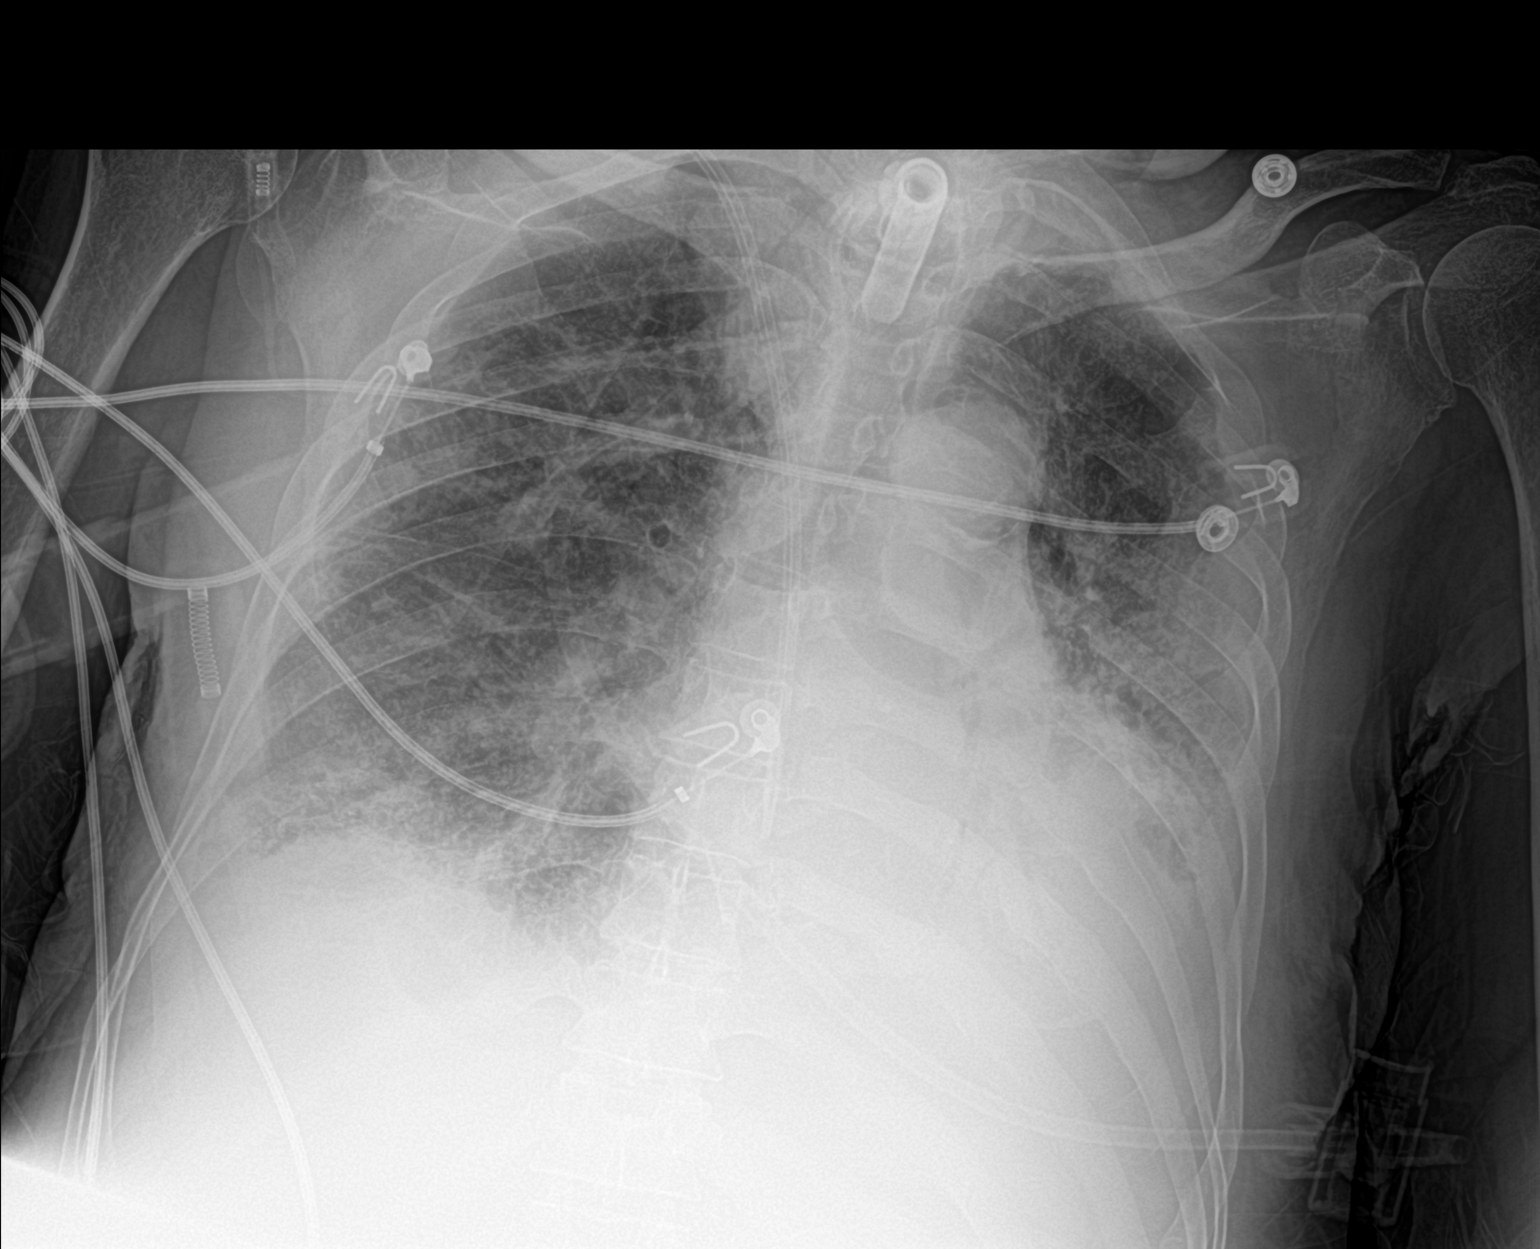

[1 of 1 positions shown; findings below may reference images not displayed]

FINDINGS: Tracheostomy tube tip seen at the level of the clavicular heads. A
right-sided central venous catheter the tip just entering the right
atrium. Again noted are diffusely increased coarsened interstitial
markings and patchy airspace opacities throughout both lungs. There
is slight interval worsening seen within the right upper lung. There
is however improvement of the airspace opacities within the left
lung. Again noted is a probable left and trace right pleural
effusion present. There is a retrocardiac opacity with air
bronchogram seen. No acute osseous abnormality.
IMPRESSION: No multifocal airspace opacities with slight interval improvement in
the left upper lung, however slight interval worsening in the right
lung.

Small left and trace right pleural effusion

Stable lines and tubes

## 2021-12-03 IMAGING — MR MR SACRUM / SI JOINTS WO CM
4 of 7 series · 25 of 48 positions shown · non-contrast
Comparison: CT scan of the abdomen and pelvis dated 12/21/2019

CLINICAL DATA: Sepsis.  Sacral decubitus ulcer.

EXAM:
MRI SACRUM WITHOUT CONTRAST
TECHNIQUE: Multiplanar, multisequence MR imaging of the sacrum was performed.
No intravenous contrast was administered.

[Series 9: T1 · axial · 4.0mm · 0.43mm/px · z∈[-153,+18]mm · 7 of 38 slices shown]
[im 1/38]
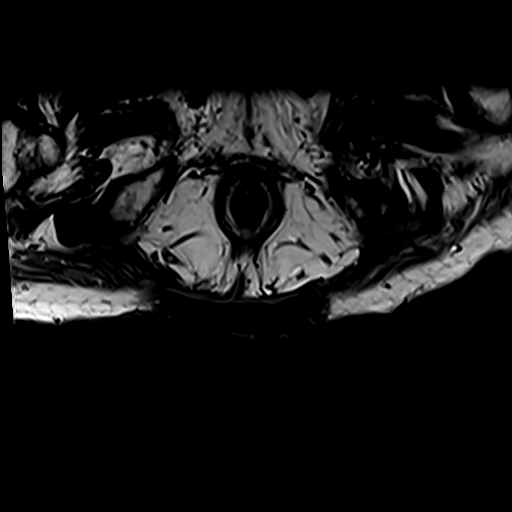
[im 7/38]
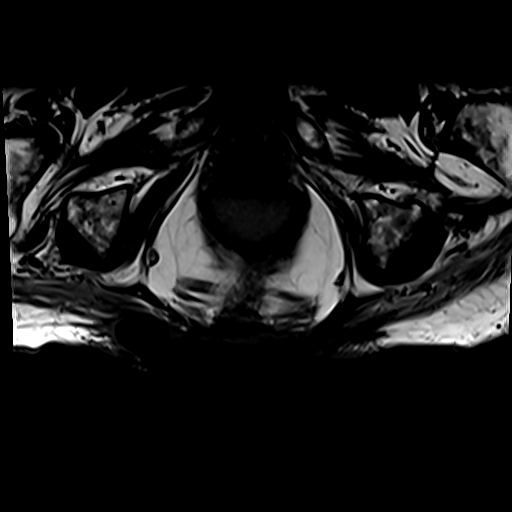
[im 13/38]
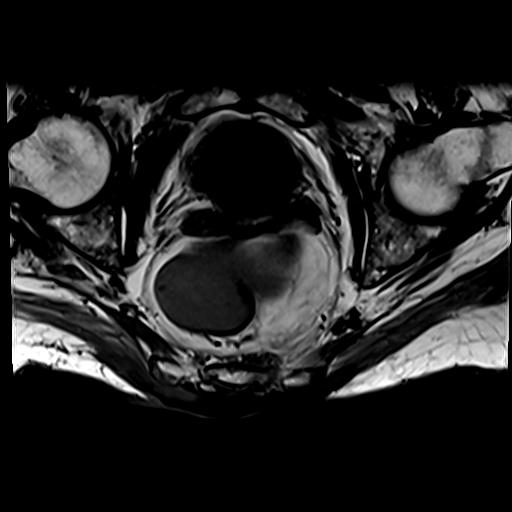
[im 19/38]
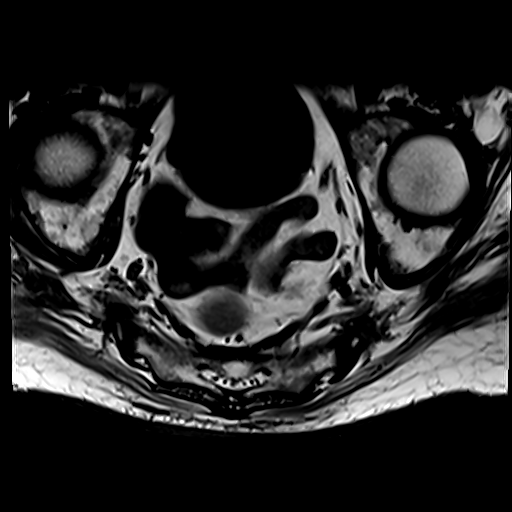
[im 25/38]
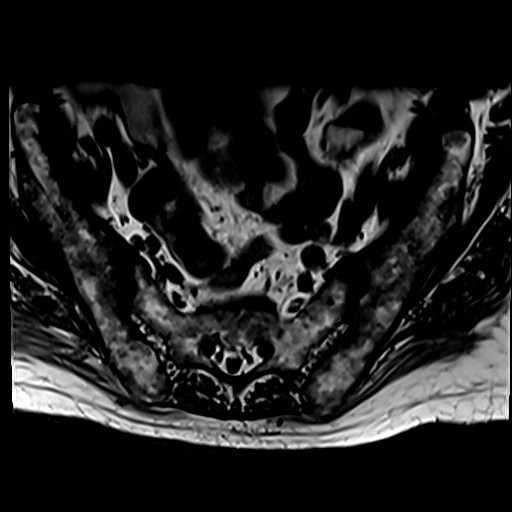
[im 31/38]
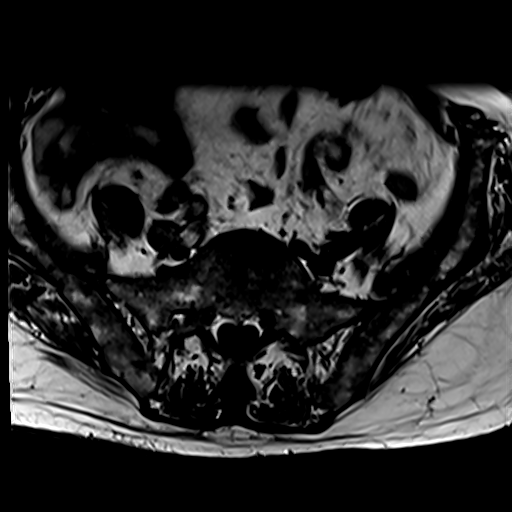
[im 38/38]
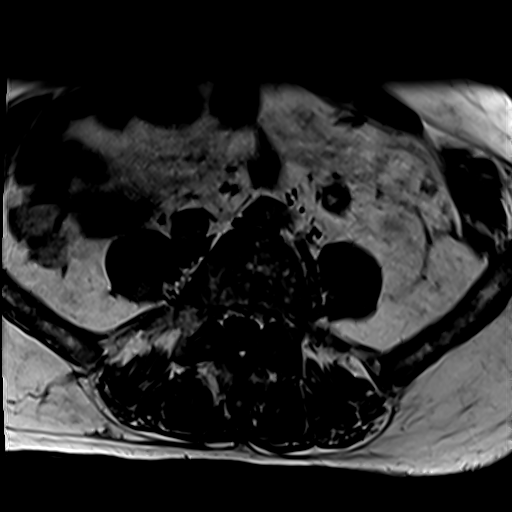

[Series 10: T2 fat-sat · axial · 4.0mm · 0.57mm/px · z∈[-153,+18]mm · 7 of 38 slices shown (1 of 2)]
[im 1/38]
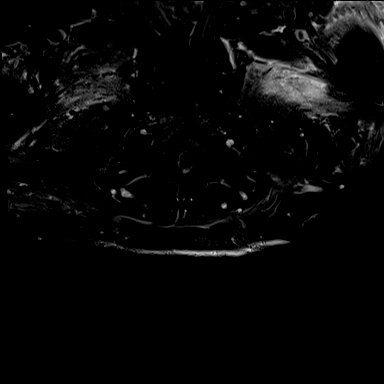
[im 7/38]
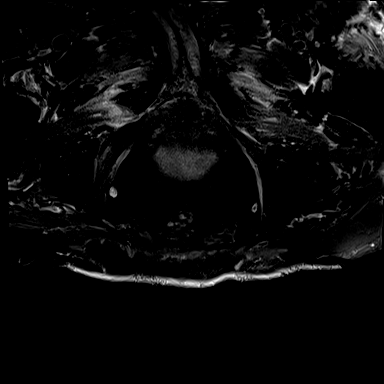
[im 13/38]
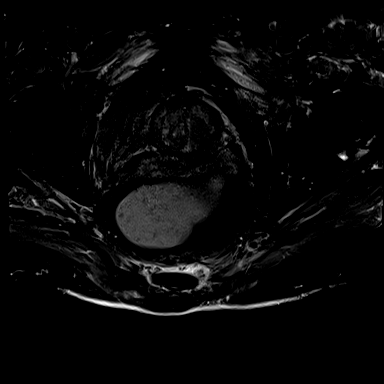
[im 19/38]
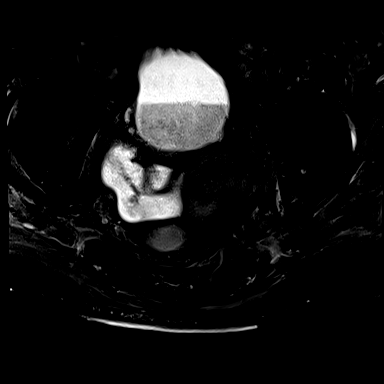
[im 25/38]
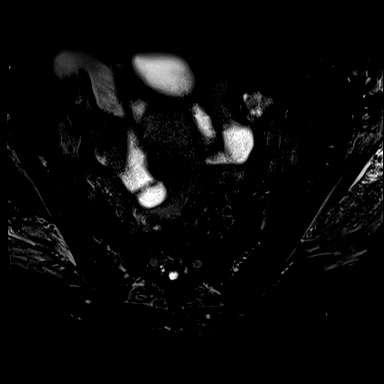
[im 31/38]
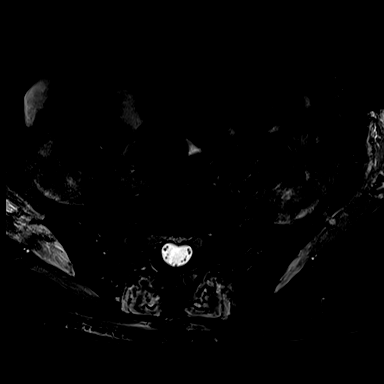
[im 38/38]
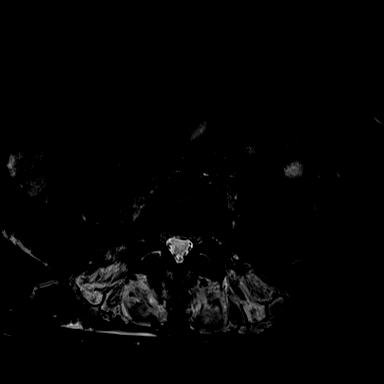

[Series 11: T2 fat-sat · sagittal · 4.0mm · 0.81mm/px · 6 of 29 slices shown (2 of 2)]
[im 1/29]
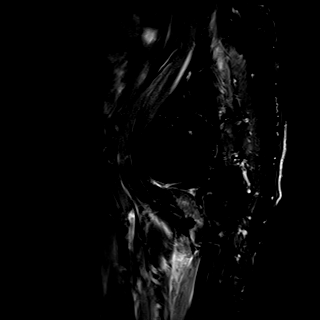
[im 6/29]
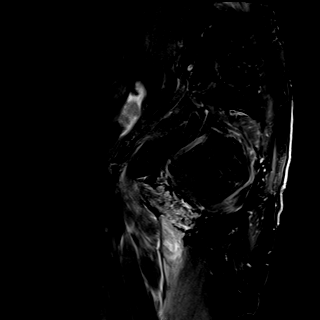
[im 12/29]
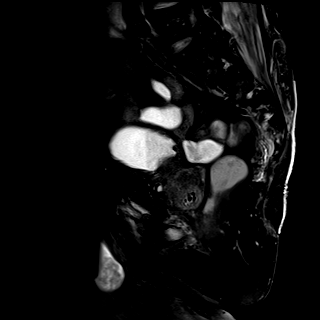
[im 17/29]
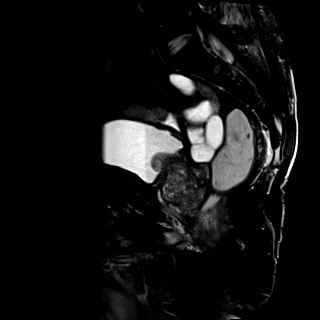
[im 23/29]
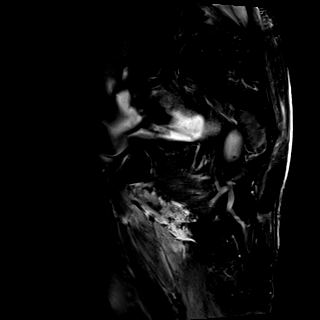
[im 29/29]
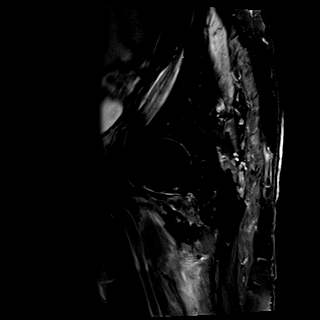

[Series 12: STIR · oblique · 3.0mm · 0.51mm/px · 5 of 29 slices shown]
[im 1/29]
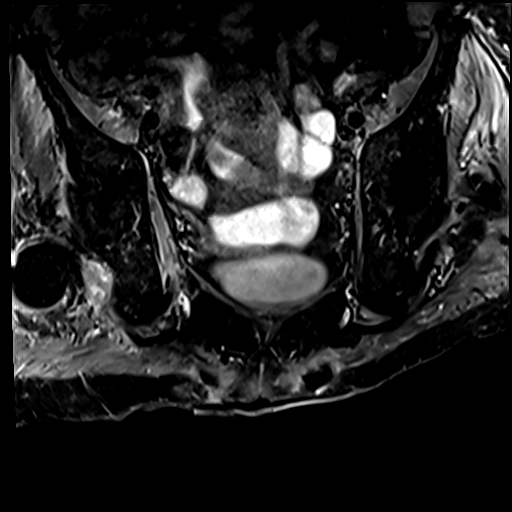
[im 6/29]
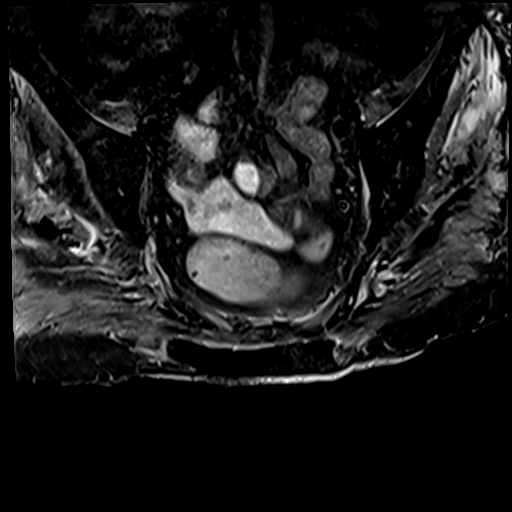
[im 12/29]
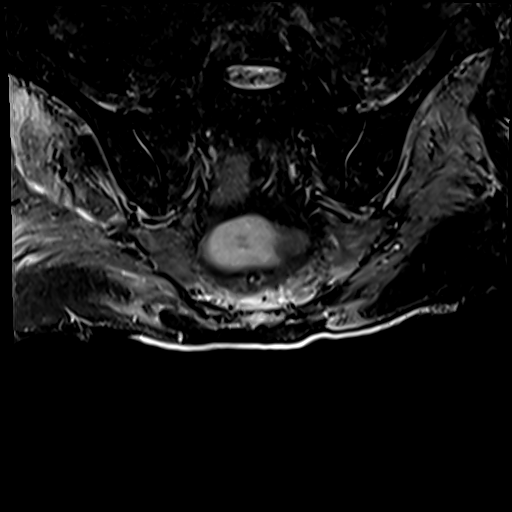
[im 17/29]
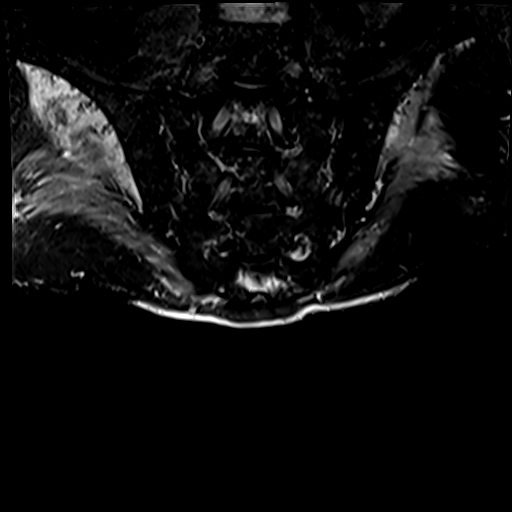
[im 29/29]
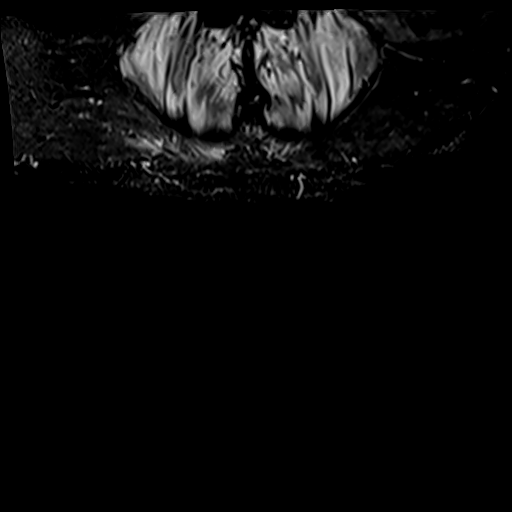

[25 of 48 positions shown; findings below may reference images not displayed]

FINDINGS: There is abnormal edema in the fifth sacral segment. The proximal
segments of the coccyx are in either destroyed or have been
previously resected due to the overlying sacral decubitus ulcer.

There is diffuse patchy low signal of the marrow of the pelvic bones
and lumbar spine consistent with red marrow reactivation.

The sacroiliac joints and hip joints are normal.

There is edema in the muscles around the hips and in the posterior
paraspinal musculature of the lower lumbar spine and in the buttocks
which could represent myositis.

Fluid-fluid level in the bladder may represent protein or debris in
the bladder. Does the patient have findings of a urinary tract
infection? Enlarged median lobe of the prostate gland.
IMPRESSION: 1. Osteomyelitis of the fifth sacral segment.
2. Edema in the muscles around the hips and in the posterior
paraspinal musculature of the lower lumbar spine and in the buttocks
which could represent myositis.
3. Fluid-fluid level in the bladder may represent protein or debris
in the bladder. The possibility of urinary tract infection should be
considered.
